# Patient Record
Sex: Male | Born: 1980
Health system: Southern US, Community
[De-identification: ages and names within clinical notes are randomized; demographics above are authoritative.]

## PROBLEM LIST (undated history)

## (undated) DIAGNOSIS — G4733 Obstructive sleep apnea (adult) (pediatric): Secondary | ICD-10-CM

## (undated) DIAGNOSIS — I5042 Chronic combined systolic (congestive) and diastolic (congestive) heart failure: Secondary | ICD-10-CM

## (undated) DIAGNOSIS — I4892 Unspecified atrial flutter: Secondary | ICD-10-CM

## (undated) DIAGNOSIS — Z9889 Other specified postprocedural states: Secondary | ICD-10-CM

## (undated) DIAGNOSIS — I493 Ventricular premature depolarization: Secondary | ICD-10-CM

## (undated) DIAGNOSIS — G4730 Sleep apnea, unspecified: Secondary | ICD-10-CM

## (undated) DIAGNOSIS — I428 Other cardiomyopathies: Secondary | ICD-10-CM

## (undated) HISTORY — DX: Obstructive sleep apnea (adult) (pediatric): G47.33

## (undated) HISTORY — PX: NO PAST SURGERIES: SHX2092

## (undated) HISTORY — DX: Other specified postprocedural states: Z98.890

---
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
## 2017-02-19 ENCOUNTER — Emergency Department (HOSPITAL_COMMUNITY): Payer: Self-pay

## 2017-02-19 ENCOUNTER — Emergency Department (HOSPITAL_COMMUNITY)
Admission: EM | Admit: 2017-02-19 | Discharge: 2017-02-20 | Disposition: A | Discharge status: Home / Self Care | Payer: Self-pay | Attending: Emergency Medicine | Admitting: Emergency Medicine

## 2017-02-19 ENCOUNTER — Encounter (HOSPITAL_COMMUNITY): Payer: Self-pay | Admitting: Emergency Medicine

## 2017-02-19 DIAGNOSIS — R600 Localized edema: Secondary | ICD-10-CM | POA: Insufficient documentation

## 2017-02-19 DIAGNOSIS — N289 Disorder of kidney and ureter, unspecified: Secondary | ICD-10-CM | POA: Insufficient documentation

## 2017-02-19 DIAGNOSIS — R609 Edema, unspecified: Secondary | ICD-10-CM

## 2017-02-19 LAB — CBC WITH DIFFERENTIAL/PLATELET
BASOS ABS: 0 10*3/uL (ref 0.0–0.1)
BASOS PCT: 0 %
EOS ABS: 0 10*3/uL (ref 0.0–0.7)
EOS PCT: 1 %
HEMATOCRIT: 43.2 % (ref 39.0–52.0)
Hemoglobin: 13.7 g/dL (ref 13.0–17.0)
Lymphocytes Relative: 24 %
Lymphs Abs: 1.6 10*3/uL (ref 0.7–4.0)
MCH: 26.5 pg (ref 26.0–34.0)
MCHC: 31.7 g/dL (ref 30.0–36.0)
MCV: 83.6 fL (ref 78.0–100.0)
MONO ABS: 0.9 10*3/uL (ref 0.1–1.0)
Monocytes Relative: 14 %
Neutro Abs: 4 10*3/uL (ref 1.7–7.7)
Neutrophils Relative %: 61 %
PLATELETS: 201 10*3/uL (ref 150–400)
RBC: 5.17 MIL/uL (ref 4.22–5.81)
RDW: 15.4 % (ref 11.5–15.5)
WBC: 6.6 10*3/uL (ref 4.0–10.5)

## 2017-02-19 LAB — URINALYSIS, ROUTINE W REFLEX MICROSCOPIC
BILIRUBIN URINE: NEGATIVE
Glucose, UA: NEGATIVE mg/dL
HGB URINE DIPSTICK: NEGATIVE
KETONES UR: NEGATIVE mg/dL
LEUKOCYTES UA: NEGATIVE
Nitrite: NEGATIVE
Specific Gravity, Urine: 1.022 (ref 1.005–1.030)
pH: 5 (ref 5.0–8.0)

## 2017-02-19 LAB — COMPREHENSIVE METABOLIC PANEL
ALBUMIN: 3.5 g/dL (ref 3.5–5.0)
ALT: 28 U/L (ref 17–63)
ANION GAP: 7 (ref 5–15)
AST: 30 U/L (ref 15–41)
Alkaline Phosphatase: 59 U/L (ref 38–126)
BILIRUBIN TOTAL: 4.1 mg/dL — AB (ref 0.3–1.2)
BUN: 14 mg/dL (ref 6–20)
CHLORIDE: 109 mmol/L (ref 101–111)
CO2: 25 mmol/L (ref 22–32)
Calcium: 9 mg/dL (ref 8.9–10.3)
Creatinine, Ser: 1.3 mg/dL — ABNORMAL HIGH (ref 0.61–1.24)
GFR calc Af Amer: >60 mL/min (ref >60)
GFR calc non Af Amer: >60 mL/min (ref >60)
GLUCOSE: 106 mg/dL — AB (ref 65–99)
POTASSIUM: 3.8 mmol/L (ref 3.5–5.1)
Sodium: 141 mmol/L (ref 135–145)
TOTAL PROTEIN: 6.8 g/dL (ref 6.5–8.1)

## 2017-02-19 LAB — BRAIN NATRIURETIC PEPTIDE: B NATRIURETIC PEPTIDE 5: 340.8 pg/mL — AB (ref 0.0–100.0)

## 2017-02-19 IMAGING — CR DG CHEST 2V
2 series · 2 of 2 positions shown · non-contrast
Comparison: None.

CLINICAL DATA: Shortness of breath.

EXAM:
CHEST  2 VIEW

[w chest pa]
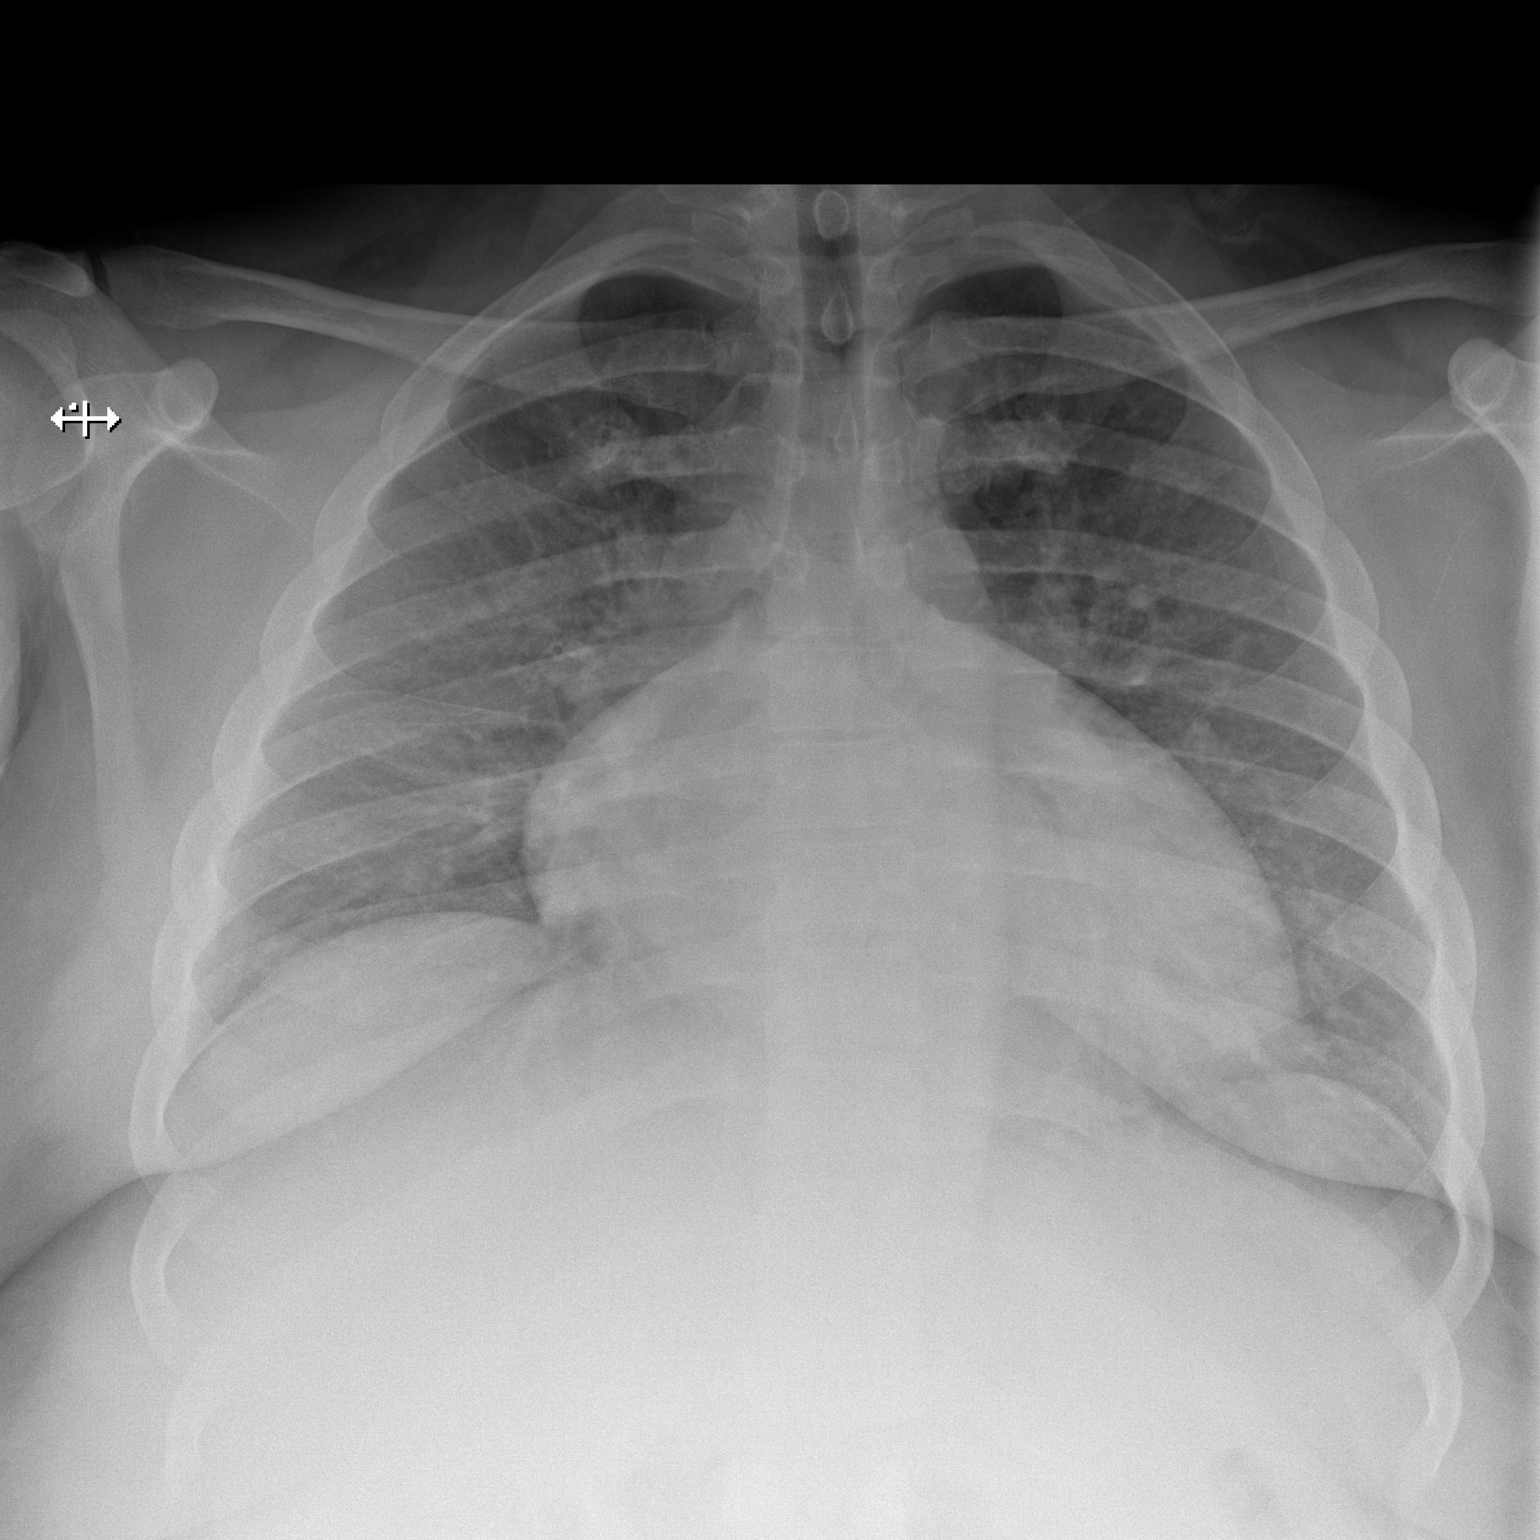

[w chest lat]
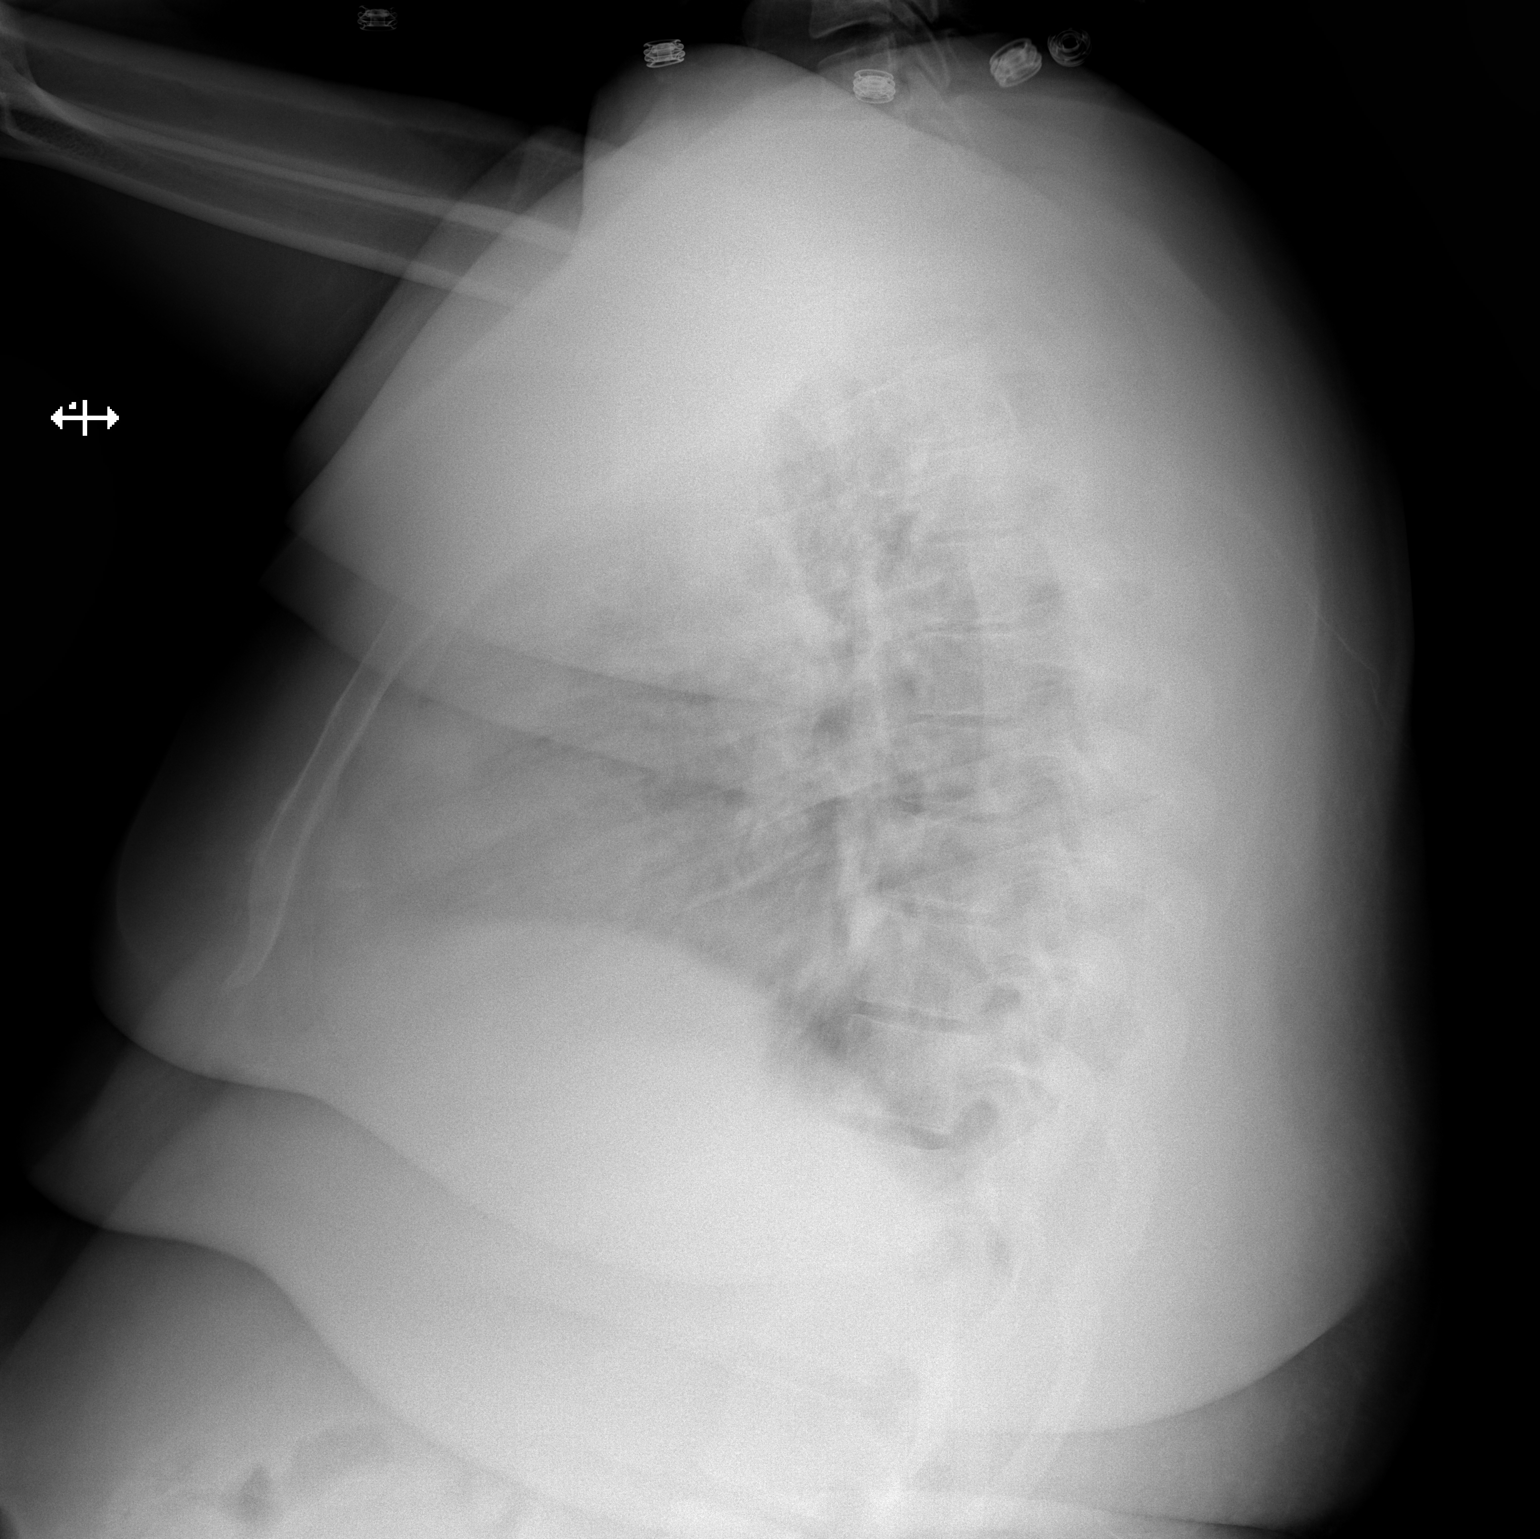

[2 of 2 positions shown; findings below may reference images not displayed]

FINDINGS: Mild cardiomegaly noted. Both lungs are clear. Respiratory motion
artifact noted on lateral view. No evidence of pneumothorax or
pleural effusion.
IMPRESSION: Mild cardiomegaly.  No active lung disease.

## 2017-02-19 MED ORDER — FUROSEMIDE 20 MG PO TABS: 20.0000 mg | ORAL_TABLET | Freq: Every day | ORAL | 0 refills | Status: DC

## 2017-02-19 NOTE — Discharge Instructions (Signed)
Call Dr. Isac Sarna office tomorrow to arrange to be seen this week in the office. You can start taking the medication prescribed tomorrow morning. Return to the emergency department if you develop fainting, lightheadedness or worsening breathing., Or if concern for any reason

## 2017-02-19 NOTE — ED Provider Notes (Signed)
[facility]-EMERGENCY DEPT Provider Note   CSN: [id number] Arrival date & time: 02/19/17  1652     History   Chief Complaint Chief Complaint  Patient presents with  . Groin Swelling    HPI Bradley Powell is a 36 y.o. male.Patient has noticed scrotum and penis swelling gradually onset 4 days ago. His father is noticed swelling of bilateral legs for the past several weeks. Other associated symptoms Patient reports dyspnea for 1 year, unchanged with nonproductive cough.. Patient has difficulty walking due to "my scrotum getting in the way" but denies leg pain. Denies chest pain. He does not have increased  with  dyspnealying supineno other associated symptoms he spoke with Dr. Kevan Ny via telephone who suggested that he come to the emergency department to check for testicular torsion. Patient denies scrotal pain   HPI Nothing makes symptoms better or worse. No other associated symptoms. No treatment prior to coming here  History reviewed. No pertinent past medical history.  There are no active problems to display for this patient.  past medical history negative   History reviewed. No pertinent surgical history.     Home Medications    Prior to Admission medications   Not on File    Family History History reviewed. No pertinent family history.  Social History Social History  Substance Use Topics  . Smoking status: Never Smoker  . Smokeless tobacco: Never Used  . Alcohol use Yes     Comment: rarely  Denies drug use    Allergies   Patient has no known allergies.   Review of Systems Review of Systems  Respiratory: Positive for shortness of breath.   Cardiovascular: Positive for leg swelling.  Genitourinary: Positive for penile swelling and scrotal swelling.  All other systems reviewed and are negative.    Physical Exam Updated Vital Signs BP (!) 143/106 (BP Location: Right Arm)   Pulse (!) 115   Temp 98 F (36.7 C) (Oral)   Resp 18   SpO2 98%   Physical Exam    Constitutional: No distress.  Chronically ill-appearing  HENT:  Head: Normocephalic and atraumatic.  Eyes: Conjunctivae are normal. Pupils are equal, round, and reactive to light.  Neck: Neck supple. No tracheal deviation present. No thyromegaly present.  Cardiovascular: Normal rate and regular rhythm.   No murmur heard. Pulmonary/Chest: Effort normal and breath sounds normal. No respiratory distress.  Abdominal: Soft. Bowel sounds are normal. He exhibits no distension. There is no tenderness.  Morbidly obese trace pitting edema at lower abdomen.  Genitourinary:  Genitourinary Comments: Penis and scrotum pitting edema. No redness or tenderness  Musculoskeletal: Normal range of motion. He exhibits edema. He exhibits no tenderness.  2+ pretibial pitting edema bilaterally  Neurological: He is alert. Coordination normal.  Skin: Skin is warm and dry. No rash noted.  Psychiatric: He has a normal mood and affect.  Nursing note and vitals reviewed.    ED Treatments / Results  Labs (all labs ordered are listed, but only abnormal results are displayed) Labs Reviewed  CBC WITH DIFFERENTIAL/PLATELET  COMPREHENSIVE METABOLIC PANEL  URINALYSIS, ROUTINE W REFLEX MICROSCOPIC  BRAIN NATRIURETIC PEPTIDE    EKG  EKG Interpretation None       Radiology No results found.  Procedures Procedures (including critical care time)  Medications Ordered in ED Medications - No data to display  8:15 PM patient is alert ambulate without difficulty. Results for orders placed or performed during the hospital encounter of 02/19/17  CBC with Differential/Platelet  Result  Value Ref Range   WBC 6.6 4.0 - 10.5 K/uL   RBC 5.17 4.22 - 5.81 MIL/uL   Hemoglobin 13.7 13.0 - 17.0 g/dL   HCT 71.6 96.7 - 89.3 %   MCV 83.6 78.0 - 100.0 fL   MCH 26.5 26.0 - 34.0 pg   MCHC 31.7 30.0 - 36.0 g/dL   RDW 81.0 17.5 - 10.2 %   Platelets 201 150 - 400 K/uL   Neutrophils Relative % 61 %   Neutro Abs 4.0 1.7 -  7.7 K/uL   Lymphocytes Relative 24 %   Lymphs Abs 1.6 0.7 - 4.0 K/uL   Monocytes Relative 14 %   Monocytes Absolute 0.9 0.1 - 1.0 K/uL   Eosinophils Relative 1 %   Eosinophils Absolute 0.0 0.0 - 0.7 K/uL   Basophils Relative 0 %   Basophils Absolute 0.0 0.0 - 0.1 K/uL  Comprehensive metabolic panel  Result Value Ref Range   Sodium 141 135 - 145 mmol/L   Potassium 3.8 3.5 - 5.1 mmol/L   Chloride 109 101 - 111 mmol/L   CO2 25 22 - 32 mmol/L   Glucose, Bld 106 (H) 65 - 99 mg/dL   BUN 14 6 - 20 mg/dL   Creatinine, Ser 5.85 (H) 0.61 - 1.24 mg/dL   Calcium 9.0 8.9 - 27.7 mg/dL   Total Protein 6.8 6.5 - 8.1 g/dL   Albumin 3.5 3.5 - 5.0 g/dL   AST 30 15 - 41 U/L   ALT 28 17 - 63 U/L   Alkaline Phosphatase 59 38 - 126 U/L   Total Bilirubin 4.1 (H) 0.3 - 1.2 mg/dL   GFR calc non Af Amer >60 >60 mL/min   GFR calc Af Amer >60 >60 mL/min   Anion gap 7 5 - 15  Urinalysis, Routine w reflex microscopic  Result Value Ref Range   Color, Urine AMBER (A) YELLOW   APPearance HAZY (A) CLEAR   Specific Gravity, Urine 1.022 1.005 - 1.030   pH 5.0 5.0 - 8.0   Glucose, UA NEGATIVE NEGATIVE mg/dL   Hgb urine dipstick NEGATIVE NEGATIVE   Bilirubin Urine NEGATIVE NEGATIVE   Ketones, ur NEGATIVE NEGATIVE mg/dL   Protein, ur >=824 (A) NEGATIVE mg/dL   Nitrite NEGATIVE NEGATIVE   Leukocytes, UA NEGATIVE NEGATIVE   RBC / HPF 0-5 0 - 5 RBC/hpf   WBC, UA 0-5 0 - 5 WBC/hpf   Bacteria, UA RARE (A) NONE SEEN   Squamous Epithelial / LPF 0-5 (A) NONE SEEN   Mucous PRESENT    Granular Casts, UA PRESENT   Brain natriuretic peptide  Result Value Ref Range   B Natriuretic Peptide 340.8 (H) 0.0 - 100.0 pg/mL   Dg Chest 2 View  Result Date: 02/19/2017 CLINICAL DATA:  Shortness of breath. EXAM: CHEST  2 VIEW COMPARISON:  None. FINDINGS: Mild cardiomegaly noted. Both lungs are clear. Respiratory motion artifact noted on lateral view. No evidence of pneumothorax or pleural effusion. IMPRESSION: Mild cardiomegaly.   No active lung disease. Electronically Signed   By: Myles Rosenthal M.D.   On: 02/19/2017 18:54   Initial Impression / Assessment and Plan / ED Course  I have reviewed the triage vital signs and the nursing notes.  Pertinent labs & imaging results that were available during my care of the patient were reviewed by me and considered in my medical decision making (see chart for details).      Patient exhibiting peripheral edema. He has no signs of pulmonary  edema. He lies flat without difficulty. Normal chest x-ray. He does exhibit mild renal insufficiency. Will start Lasix 20 mg daily. Suggest follow-up with Dr. Kevan Ny this week  Final Clinical Impressions(s) / ED Diagnoses  Diagnosis #1 peripheral edema #2 mild renal insufficiency Final diagnoses:  None    New Prescriptions New Prescriptions   No medications on file     Doug Sou, MD 02/19/17 2021

## 2017-02-19 NOTE — ED Triage Notes (Signed)
Pt c/o swollen scrotum. Dr. Kevan Ny sent pt to ED, who was concerned about testicular torsion. Pain onset Thursday about 0400. Difficult to urinate but able to. Some numbness to right leg. No worsening or change in swelling today. No Pain.

## 2017-02-20 NOTE — ED Provider Notes (Signed)
Patient presented to the ED today stating he did not get a prescription for Lasix yesterday.  I wrote a handwritten prescription for Lasix 20 mg #30 no refill   Mancel Bale, MD 02/20/17 1316

## 2017-02-22 ENCOUNTER — Ambulatory Visit (HOSPITAL_COMMUNITY): Payer: Self-pay | Attending: Cardiology

## 2017-02-22 ENCOUNTER — Other Ambulatory Visit: Payer: Self-pay | Admitting: Internal Medicine

## 2017-02-22 ENCOUNTER — Other Ambulatory Visit: Payer: Self-pay

## 2017-02-22 ENCOUNTER — Encounter (HOSPITAL_COMMUNITY): Payer: Self-pay | Admitting: *Deleted

## 2017-02-22 DIAGNOSIS — I509 Heart failure, unspecified: Secondary | ICD-10-CM

## 2017-02-22 DIAGNOSIS — I081 Rheumatic disorders of both mitral and tricuspid valves: Secondary | ICD-10-CM | POA: Insufficient documentation

## 2017-02-22 DIAGNOSIS — Z8249 Family history of ischemic heart disease and other diseases of the circulatory system: Secondary | ICD-10-CM | POA: Insufficient documentation

## 2017-02-22 DIAGNOSIS — E669 Obesity, unspecified: Secondary | ICD-10-CM | POA: Insufficient documentation

## 2017-02-22 NOTE — Progress Notes (Unsigned)
2D Echo was completed, results (decreased EF, edema, arrythmia- HR between 100-200) taken to DOD, Dr. Johney Frame.  He feels the patient should be seen by General Cardiology, and the patient was scheduled to be seen by Dr. Anne Fu on Friday at 1:20 PM.  I explained the importance of keeping this appointment, and Bradley Powell was very agreeable.  He is stable and does not complain of any symptoms during visit.    Farrel Conners, RDCS

## 2017-02-24 ENCOUNTER — Inpatient Hospital Stay (HOSPITAL_COMMUNITY)
Admission: AD | Admit: 2017-02-24 | Discharge: 2017-03-07 | DRG: 287 | Disposition: A | Discharge status: Home / Self Care | Payer: Self-pay | Source: Ambulatory Visit | Attending: Cardiology | Admitting: Cardiology

## 2017-02-24 ENCOUNTER — Ambulatory Visit (HOSPITAL_COMMUNITY): Payer: Self-pay | Admitting: Cardiology

## 2017-02-24 ENCOUNTER — Encounter (HOSPITAL_COMMUNITY): Payer: Self-pay | Admitting: Cardiology

## 2017-02-24 VITALS — BP 138/78 | HR 115 | Ht 68.0 in | Wt 333.5 lb

## 2017-02-24 DIAGNOSIS — M940 Chondrocostal junction syndrome [Tietze]: Secondary | ICD-10-CM | POA: Insufficient documentation

## 2017-02-24 DIAGNOSIS — E663 Overweight: Secondary | ICD-10-CM | POA: Insufficient documentation

## 2017-02-24 DIAGNOSIS — R0602 Shortness of breath: Secondary | ICD-10-CM

## 2017-02-24 DIAGNOSIS — I509 Heart failure, unspecified: Secondary | ICD-10-CM | POA: Insufficient documentation

## 2017-02-24 DIAGNOSIS — R609 Edema, unspecified: Secondary | ICD-10-CM | POA: Diagnosis present

## 2017-02-24 DIAGNOSIS — I440 Atrioventricular block, first degree: Secondary | ICD-10-CM | POA: Diagnosis present

## 2017-02-24 DIAGNOSIS — R000 Tachycardia, unspecified: Secondary | ICD-10-CM

## 2017-02-24 DIAGNOSIS — E876 Hypokalemia: Secondary | ICD-10-CM | POA: Diagnosis not present

## 2017-02-24 DIAGNOSIS — N5089 Other specified disorders of the male genital organs: Secondary | ICD-10-CM | POA: Diagnosis present

## 2017-02-24 DIAGNOSIS — I5041 Acute combined systolic (congestive) and diastolic (congestive) heart failure: Secondary | ICD-10-CM | POA: Diagnosis present

## 2017-02-24 DIAGNOSIS — R29818 Other symptoms and signs involving the nervous system: Secondary | ICD-10-CM

## 2017-02-24 DIAGNOSIS — M109 Gout, unspecified: Secondary | ICD-10-CM | POA: Diagnosis not present

## 2017-02-24 DIAGNOSIS — I5043 Acute on chronic combined systolic (congestive) and diastolic (congestive) heart failure: Principal | ICD-10-CM | POA: Diagnosis present

## 2017-02-24 DIAGNOSIS — I5082 Biventricular heart failure: Secondary | ICD-10-CM | POA: Diagnosis present

## 2017-02-24 DIAGNOSIS — I2729 Other secondary pulmonary hypertension: Secondary | ICD-10-CM | POA: Diagnosis present

## 2017-02-24 DIAGNOSIS — E66812 Obesity, class 2: Secondary | ICD-10-CM | POA: Diagnosis present

## 2017-02-24 DIAGNOSIS — Z6841 Body Mass Index (BMI) 40.0 and over, adult: Secondary | ICD-10-CM

## 2017-02-24 LAB — CBC WITH DIFFERENTIAL/PLATELET
BASOS PCT: 0 %
Basophils Absolute: 0 10*3/uL (ref 0.0–0.1)
EOS ABS: 0.1 10*3/uL (ref 0.0–0.7)
Eosinophils Relative: 1 %
HCT: 42.2 % (ref 39.0–52.0)
HEMOGLOBIN: 13.5 g/dL (ref 13.0–17.0)
LYMPHS ABS: 1.6 10*3/uL (ref 0.7–4.0)
Lymphocytes Relative: 20 %
MCH: 26.5 pg (ref 26.0–34.0)
MCHC: 32 g/dL (ref 30.0–36.0)
MCV: 82.9 fL (ref 78.0–100.0)
Monocytes Absolute: 0.9 10*3/uL (ref 0.1–1.0)
Monocytes Relative: 11 %
NEUTROS PCT: 68 %
Neutro Abs: 5.2 10*3/uL (ref 1.7–7.7)
PLATELETS: 215 10*3/uL (ref 150–400)
RBC: 5.09 MIL/uL (ref 4.22–5.81)
RDW: 15.4 % (ref 11.5–15.5)
WBC: 7.7 10*3/uL (ref 4.0–10.5)

## 2017-02-24 LAB — COMPREHENSIVE METABOLIC PANEL
ALT: 30 U/L (ref 17–63)
ANION GAP: 9 (ref 5–15)
AST: 32 U/L (ref 15–41)
Albumin: 3.3 g/dL — ABNORMAL LOW (ref 3.5–5.0)
Alkaline Phosphatase: 66 U/L (ref 38–126)
BUN: 20 mg/dL (ref 6–20)
CHLORIDE: 106 mmol/L (ref 101–111)
CO2: 25 mmol/L (ref 22–32)
CREATININE: 1.27 mg/dL — AB (ref 0.61–1.24)
Calcium: 9.1 mg/dL (ref 8.9–10.3)
Glucose, Bld: 100 mg/dL — ABNORMAL HIGH (ref 65–99)
POTASSIUM: 3.9 mmol/L (ref 3.5–5.1)
SODIUM: 140 mmol/L (ref 135–145)
Total Bilirubin: 4.3 mg/dL — ABNORMAL HIGH (ref 0.3–1.2)
Total Protein: 6.6 g/dL (ref 6.5–8.1)

## 2017-02-24 LAB — MAGNESIUM: MAGNESIUM: 2 mg/dL (ref 1.7–2.4)

## 2017-02-24 LAB — PROTIME-INR
INR: 1.32
PROTHROMBIN TIME: 16.5 s — AB (ref 11.4–15.2)

## 2017-02-24 LAB — TROPONIN I: TROPONIN I: 0.04 ng/mL — AB (ref <0.03)

## 2017-02-24 LAB — BRAIN NATRIURETIC PEPTIDE: B Natriuretic Peptide: 361.6 pg/mL — ABNORMAL HIGH (ref 0.0–100.0)

## 2017-02-24 MED ORDER — SODIUM CHLORIDE 0.9% FLUSH
3.0000 mL | Freq: Two times a day (BID) | INTRAVENOUS | Status: DC
  Administered 2017-02-24 – 2017-03-06 (×17): 3 mL via INTRAVENOUS

## 2017-02-24 MED ORDER — ALPRAZOLAM 0.25 MG PO TABS
0.2500 mg | ORAL_TABLET | Freq: Two times a day (BID) | ORAL | Status: DC | PRN
  Administered 2017-03-01 – 2017-03-04 (×4): 0.25 mg via ORAL
  Filled 2017-02-24 (×5): qty 1

## 2017-02-24 MED ORDER — HEPARIN SODIUM (PORCINE) 5000 UNIT/ML IJ SOLN
5000.0000 [IU] | Freq: Three times a day (TID) | INTRAMUSCULAR | Status: DC
  Administered 2017-02-24 – 2017-03-06 (×29): 5000 [IU] via SUBCUTANEOUS
  Filled 2017-02-24 (×33): qty 1

## 2017-02-24 MED ORDER — ZOLPIDEM TARTRATE 5 MG PO TABS
5.0000 mg | ORAL_TABLET | Freq: Every evening | ORAL | Status: DC | PRN
  Filled 2017-02-24: qty 1

## 2017-02-24 MED ORDER — SODIUM CHLORIDE 0.9% FLUSH: 3.0000 mL | INTRAVENOUS | Status: DC | PRN

## 2017-02-24 MED ORDER — ACETAMINOPHEN 325 MG PO TABS: 650.0000 mg | ORAL_TABLET | ORAL | Status: DC | PRN

## 2017-02-24 MED ORDER — ASPIRIN EC 81 MG PO TBEC
81.0000 mg | DELAYED_RELEASE_TABLET | Freq: Every day | ORAL | Status: DC
  Administered 2017-02-24 – 2017-03-05 (×9): 81 mg via ORAL
  Filled 2017-02-24 (×10): qty 1

## 2017-02-24 MED ORDER — ACETAMINOPHEN 500 MG PO TABS: 500.0000 mg | ORAL_TABLET | Freq: Four times a day (QID) | ORAL | Status: DC | PRN

## 2017-02-24 MED ORDER — FUROSEMIDE 10 MG/ML IJ SOLN
80.0000 mg | Freq: Two times a day (BID) | INTRAMUSCULAR | Status: DC
  Administered 2017-02-24 – 2017-02-25 (×2): 80 mg via INTRAVENOUS
  Filled 2017-02-24 (×2): qty 8

## 2017-02-24 MED ORDER — ONDANSETRON HCL 4 MG/2ML IJ SOLN: 4.0000 mg | Freq: Four times a day (QID) | INTRAMUSCULAR | Status: DC | PRN

## 2017-02-24 MED ORDER — SODIUM CHLORIDE 0.9 % IV SOLN: 250.0000 mL | INTRAVENOUS | Status: DC | PRN

## 2017-02-24 MED ORDER — POTASSIUM CHLORIDE CRYS ER 20 MEQ PO TBCR
20.0000 meq | EXTENDED_RELEASE_TABLET | Freq: Two times a day (BID) | ORAL | Status: DC
  Administered 2017-02-24 – 2017-03-04 (×15): 20 meq via ORAL
  Filled 2017-02-24 (×17): qty 1

## 2017-02-24 MED ORDER — LISINOPRIL 5 MG PO TABS
5.0000 mg | ORAL_TABLET | Freq: Every day | ORAL | Status: DC
  Administered 2017-02-24 – 2017-02-28 (×5): 5 mg via ORAL
  Filled 2017-02-24 (×5): qty 1

## 2017-02-24 NOTE — Progress Notes (Signed)
Pt arrived, resting in bed with father in room.  No complaints.  Labs to follow.

## 2017-02-24 NOTE — H&P (Signed)
History and Physical   Per Dr. Anne Fu  Cardiology history and physical    Date:  02/24/2017   ID:  Bradley Powell, DOB Apr 20, 1981, MRN [id number]  PCP:  Bradley Apo, MD          Cardiologist:   Bradley Schultz, MD     History of Present Illness:  Bradley Powell is a 36 y.o. male here for evaluation of newly diagnosed systolic heart failure with severe edema at the request of Dr. [name]. Baseline ECG on 02/21/17 shows low voltage, sinus tachycardia heart rate of 115 bpm with first-degree AV block.  In review of Dr. Idelle Powell note, he was having increased edema, scrotal edema, without prior history of heart failure. His thoughts were that the swelling was probably related to congestive heart failure but cannot exclude pulmonary hypertension as his father had concern for severe sleep apnea. He was given diuresis and a 2-D echo was obtained.  His echo shows an EF of 40-45% with mild to moderate pulmonary hypertension 46 mmHg.  When laying down he tablet has increased coughing. This is been fairly chronic. He states that one year ago for a physical at his work, his weight was 260 pounds. Today is 330 pounds.  His main issue that brought him in to see Dr. Nehemiah Powell was the new scrotal swelling.  He is not describing any chest pain. Positive orthopnea. No syncope, no fevers, no bleeding, no prior heart history, no smoking, no diabetes   Mucus at night. Burning numbness in leg. Mother Bradley Powell, I take care of.  Cough since Nov.   Urology was also consulted by Dr. Nehemiah Powell as outpatient. Lasix 40 mg twice a day was administered on 02/21/17, not a tremendous amount of urine. His weight was 335 pounds at that visit. BMI was 51. He was satting on 98% on room air.  I take care of his mother Bradley Powell.      Past Medical History:  Diagnosis Date  . History of esophagogastroduodenoscopy (EGD)          Past Surgical History:  Procedure Laterality Date  . NO PAST  SURGERIES      Current Medications:       Outpatient Medications Prior to Visit  Medication Sig Dispense Refill  . acetaminophen (TYLENOL) 500 MG tablet Take 500 mg by mouth every 6 (six) hours as needed (pain).    . furosemide (LASIX) 20 MG tablet Take 1 tablet (20 mg total) by mouth daily. (Patient taking differently: Take 80 mg by mouth daily. ) 30 tablet 0   No facility-administered medications prior to visit.      Allergies:   Patient has no known allergies.   Social History        Social History  . Marital status: Single    Spouse name: N/A  . Number of children: N/A  . Years of education: N/A         Social History Main Topics  . Smoking status: Never Smoker  . Smokeless tobacco: Never Used  . Alcohol use Yes     Comment: rarely  . Drug use: No  . Sexual activity: Not on file       Other Topics Concern  . Not on file      Social History Narrative  . No narrative on file     Family History:  The patient's family history includes CVA in his mother; Gout in his father; Hypertension in his father; Multiple sclerosis in  his mother; Seizures in his mother.   ROS:   Please see the history of present illness.    ROS All other systems reviewed and are negative.   PHYSICAL EXAM:   VS:  BP 138/78   Pulse (!) 115   Ht 5\' 8"  (1.727 m)   Wt (!) 333 lb 8 oz (151.3 kg)   SpO2 97%   BMI 50.71 kg/m    GEN: Morbidly obese, in no acute distress  HEENT: normal thick neck Neck: + JVD to jaw line, no carotid bruits, or masses Cardiac: Tachycardic regular, positive S3; no murmurs. 3-4+ pitting edema bilateral lower extremities up to thighs. Scrotal edema. Respiratory:  clear to auscultation bilaterally, normal work of breathing GI: soft, nontender, nondistended, + BS, obese MS: no deformity or atrophy  Skin: warm and dry, no rash Neuro:  Alert and Oriented x 3, Strength and sensation are intact Psych: euthymic mood, full affect     Wt  Readings from Last 3 Encounters:  02/24/17 (!) 333 lb 8 oz (151.3 kg)      Studies/Labs Reviewed:   EKG:  Prior EKG as described above, personally viewed  Recent Labs: 02/19/2017: ALT 28; B Natriuretic Peptide 340.8; BUN 14; Creatinine, Ser 1.30; Hemoglobin 13.7; Platelets 201; Potassium 3.8; Sodium 141   Lipid Panel Labs (Brief)  No results found for: CHOL, TRIG, HDL, CHOLHDL, VLDL, LDLCALC, LDLDIRECT    Additional studies/ records that were reviewed today include:   ECHO: 02/22/17 - Left ventricle: The cavity size was normal. Systolic function was mildly to moderately reduced. The estimated ejection fraction was in the range of 40% to 45%. Wall motion was normal; there were no regional wall motion abnormalities. Features are consistent with a pseudonormal left ventricular filling pattern, with concomitant abnormal relaxation and increased filling pressure (grade 2 diastolic dysfunction). - Mitral valve: There was mild to moderate regurgitation. - Left atrium: The atrium was mildly dilated. Anterior-posterior dimension: 46 mm. - Pulmonary arteries: Systolic pressure was mildly increased. - Pericardium, extracardiac: A trivial pericardial effusion was identified posterior to the heart.  TSH was normal 3.61, BNP was 307 elevated, creatinine 1.13.   ASSESSMENT:    1. Acute combined systolic and diastolic heart failure (HCC)   2. Sinus tachycardia   3. First degree AV block   4. Scrotal edema   5. Morbid obesity (HCC)      PLAN:  In order of problems listed above:  Acute systolic and diastolic heart failure  - He is grossly fluid overloaded currently, ascites. Scrotal edema.  - Last year weight was 260 pounds, currently 333 pounds.  - Ejection fraction 40-45%.  - Inpatient admission  - We will place on IV Lasix 80 mg twice a day  - Potassium supplementation, KDur 20 meq BID  - Closely monitor basic metabolic profile, for renal  performance  - He has several liters of fluid on board that needs to be diuresed.  - TSH was normal.  - Check complete metabolic profile.   - Add lisinopril 5 mg once a day.  - Add carvedilol once diuresed  Sinus tachycardia with first-degree AV block  - Perhaps this is an atrial tachycardia. P waves are upright in 1, 2, 3.  - Hopefully this will improve as cardiac condition improves.  Scrotal edema  - Hopefully with aggressive diuresis, this will decrease  - If necessary, can consult urology.  Morbid obesity  - Continue to encourage weight loss.  DVT prophylaxis   Medication Adjustments/Labs and  Tests Ordered: Current medicines are reviewed at length with the patient today.  Concerns regarding medicines are outlined above.  Medication changes, Labs and Tests ordered today are listed in the Patient Instructions below. There are no Patient Instructions on file for this visit.   Signed, Bradley Schultz, MD  02/24/2017 1:58 PM    [facility] [address] [place], [place], [place]  [address] Phone: [phone]; Fax: [phone]

## 2017-02-24 NOTE — Progress Notes (Signed)
Cardiology history and physical    Date:  02/24/2017   ID:  Bradley Powell, DOB 1981/08/12, MRN [id number]  PCP:  Katy Apo, MD  Cardiologist:   Donato Schultz, MD     History of Present Illness:  Bradley Powell is a 36 y.o. male here for evaluation of newly diagnosed systolic heart failure with severe edema at the request of Dr. [name]. Baseline ECG on 02/21/17 shows low voltage, sinus tachycardia heart rate of 115 bpm with first-degree AV block.  In review of Dr. Idelle Crouch note, he was having increased edema, scrotal edema, without prior history of heart failure. His thoughts were that the swelling was probably related to congestive heart failure but cannot exclude pulmonary hypertension as his father had concern for severe sleep apnea. He was given diuresis and a 2-D echo was obtained.  His echo shows an EF of 40-45% with mild to moderate pulmonary hypertension 46 mmHg.  When laying down he tablet has increased coughing. This is been fairly chronic. He states that one year ago for a physical at his work, his weight was 260 pounds. Today is 330 pounds.  His main issue that brought him in to see Dr. Nehemiah Settle was the new scrotal swelling.  He is not describing any chest pain. Positive orthopnea. No syncope, no fevers, no bleeding, no prior heart history, no smoking, no diabetes   Mucus at night. Burning numbness in leg. Mother Porfirio Mylar, I take care of.  Cough since Nov.   Urology was also consulted by Dr. Nehemiah Settle as outpatient. Lasix 40 mg twice a day was administered on 02/21/17, not a tremendous amount of urine. His weight was 335 pounds at that visit. BMI was 51. He was satting on 98% on room air.  I take care of his mother Porfirio Mylar.  Past Medical History:  Diagnosis Date  . History of esophagogastroduodenoscopy (EGD)     Past Surgical History:  Procedure Laterality Date  . NO PAST SURGERIES      Current Medications: Outpatient Medications Prior to Visit    Medication Sig Dispense Refill  . acetaminophen (TYLENOL) 500 MG tablet Take 500 mg by mouth every 6 (six) hours as needed (pain).    . furosemide (LASIX) 20 MG tablet Take 1 tablet (20 mg total) by mouth daily. (Patient taking differently: Take 80 mg by mouth daily. ) 30 tablet 0   No facility-administered medications prior to visit.      Allergies:   Patient has no known allergies.   Social History   Social History  . Marital status: Single    Spouse name: N/A  . Number of children: N/A  . Years of education: N/A   Social History Main Topics  . Smoking status: Never Smoker  . Smokeless tobacco: Never Used  . Alcohol use Yes     Comment: rarely  . Drug use: No  . Sexual activity: Not on file   Other Topics Concern  . Not on file   Social History Narrative  . No narrative on file     Family History:  The patient's family history includes CVA in his mother; Gout in his father; Hypertension in his father; Multiple sclerosis in his mother; Seizures in his mother.   ROS:   Please see the history of present illness.    ROS All other systems reviewed and are negative.   PHYSICAL EXAM:   VS:  BP 138/78   Pulse (!) 115   Ht 5\' 8"  (1.727  m)   Wt (!) 333 lb 8 oz (151.3 kg)   SpO2 97%   BMI 50.71 kg/m    GEN: Morbidly obese, in no acute distress  HEENT: normal thick neck Neck: + JVD to jaw line, no carotid bruits, or masses Cardiac: Tachycardic regular, positive S3; no murmurs. 3-4+ pitting edema bilateral lower extremities up to thighs. Scrotal edema. Respiratory:  clear to auscultation bilaterally, normal work of breathing GI: soft, nontender, nondistended, + BS, obese MS: no deformity or atrophy  Skin: warm and dry, no rash Neuro:  Alert and Oriented x 3, Strength and sensation are intact Psych: euthymic mood, full affect  Wt Readings from Last 3 Encounters:  02/24/17 (!) 333 lb 8 oz (151.3 kg)      Studies/Labs Reviewed:   EKG:  Prior EKG as described  above, personally viewed  Recent Labs: 02/19/2017: ALT 28; B Natriuretic Peptide 340.8; BUN 14; Creatinine, Ser 1.30; Hemoglobin 13.7; Platelets 201; Potassium 3.8; Sodium 141   Lipid Panel No results found for: CHOL, TRIG, HDL, CHOLHDL, VLDL, LDLCALC, LDLDIRECT  Additional studies/ records that were reviewed today include:   ECHO: 02/22/17 - Left ventricle: The cavity size was normal. Systolic function was   mildly to moderately reduced. The estimated ejection fraction was   in the range of 40% to 45%. Wall motion was normal; there were no   regional wall motion abnormalities. Features are consistent with   a pseudonormal left ventricular filling pattern, with concomitant   abnormal relaxation and increased filling pressure (grade 2   diastolic dysfunction). - Mitral valve: There was mild to moderate regurgitation. - Left atrium: The atrium was mildly dilated. Anterior-posterior   dimension: 46 mm. - Pulmonary arteries: Systolic pressure was mildly increased. - Pericardium, extracardiac: A trivial pericardial effusion was   identified posterior to the heart.  TSH was normal 3.61, BNP was 307 elevated, creatinine 1.13.   ASSESSMENT:    1. Acute combined systolic and diastolic heart failure (HCC)   2. Sinus tachycardia   3. First degree AV block   4. Scrotal edema   5. Morbid obesity (HCC)      PLAN:  In order of problems listed above:  Acute systolic and diastolic heart failure  - He is grossly fluid overloaded currently, ascites. Scrotal edema.  - Last year weight was 260 pounds, currently 333 pounds.  - Ejection fraction 40-45%.  - Inpatient admission  - We will place on IV Lasix 80 mg twice a day  - Potassium supplementation, KDur 20 meq BID  - Closely monitor basic metabolic profile, for renal performance  - He has several liters of fluid on board that needs to be diuresed.  - TSH was normal.  - Check complete metabolic profile.   - Add lisinopril 5 mg once a  day.  - Add carvedilol once diuresed  Sinus tachycardia with first-degree AV block  - Perhaps this is an atrial tachycardia. P waves are upright in 1, 2, 3.  - Hopefully this will improve as cardiac condition improves.  Scrotal edema  - Hopefully with aggressive diuresis, this will decrease  - If necessary, can consult urology.  Morbid obesity  - Continue to encourage weight loss.  DVT prophylaxis   Medication Adjustments/Labs and Tests Ordered: Current medicines are reviewed at length with the patient today.  Concerns regarding medicines are outlined above.  Medication changes, Labs and Tests ordered today are listed in the Patient Instructions below. There are no Patient Instructions on file for  this visit.   Signed, Donato Schultz, MD  02/24/2017 1:58 PM    [facility] [address] [place], [place], [place]  [address] Phone: [phone]; Fax: [phone]

## 2017-02-24 NOTE — Progress Notes (Signed)
Nutrition Education Note  RD consulted for nutrition education regarding new onset CHF.  RD provided "Heart failureNutrition Therapy" handout from the Academy of [employer]. Reviewed patient's dietary recall. Pt reports eating fruit in the morning and chicken or fish with vegetables for dinner. He states that he doesn't use salt because his mother also has heart failure. He reports drinking a lot of water. He occasionally eats out, but not often. Provided examples on ways to monitor sodium intake in diet. Discussed tips for eating out. Discouraged intake of processed foods and use of salt shaker. Encouraged fresh fruits and vegetables. Discussed the importance of eating consistently rather than only one meal daily. Provide "Power Up with Breakfast" handout from the Academy of [employer].  RD discussed why it is important for patient to adhere to diet recommendations, and emphasized the role of fluids, foods to avoid, and importance of weighing self daily. Teach back method used.  Expect good compliance.  Body mass index is 48.92 kg/m. Pt meets criteria for Morbid Obesity based on current BMI. Pt states that he usually weighs between 250 lbs and 260 lbs. Current weight is 326 lbs.   Current diet order is 2 gram Sodium, patient is consuming approximately 100% of meals at this time. Labs and medications reviewed. No further nutrition interventions warranted at this time. RD contact information provided. If additional nutrition issues arise, please re-consult RD.   Dorothea Ogle RD, LDN, CSP Inpatient Clinical Dietitian Pager: [phone] After Hours Pager: [phone]

## 2017-02-24 NOTE — Progress Notes (Signed)
CRITICAL VALUE ALERT  Critical value received:  Troponin 0.04  Date of notification:  02/24/17  Time of notification: 1800  Critical value read back:  yes  Nurse who received alert:  Zollie Beckers  MD notified (1st page):  Annie Paras, NP  Time of first page:  1815  MD notified (2nd page):  Time of second page:  Responding MD:  Annie Paras, NP  Time MD responded:

## 2017-02-25 ENCOUNTER — Inpatient Hospital Stay (HOSPITAL_COMMUNITY): Payer: Self-pay

## 2017-02-25 DIAGNOSIS — I5041 Acute combined systolic (congestive) and diastolic (congestive) heart failure: Secondary | ICD-10-CM

## 2017-02-25 LAB — BASIC METABOLIC PANEL
ANION GAP: 12 (ref 5–15)
BUN: 20 mg/dL (ref 6–20)
CALCIUM: 9.3 mg/dL (ref 8.9–10.3)
CHLORIDE: 102 mmol/L (ref 101–111)
CO2: 25 mmol/L (ref 22–32)
Creatinine, Ser: 1.26 mg/dL — ABNORMAL HIGH (ref 0.61–1.24)
GFR calc Af Amer: >60 mL/min (ref >60)
GFR calc non Af Amer: >60 mL/min (ref >60)
GLUCOSE: 115 mg/dL — AB (ref 65–99)
Potassium: 4.5 mmol/L (ref 3.5–5.1)
Sodium: 139 mmol/L (ref 135–145)

## 2017-02-25 LAB — HIV ANTIBODY (ROUTINE TESTING W REFLEX): HIV Screen 4th Generation wRfx: NONREACTIVE

## 2017-02-25 IMAGING — CR DG CHEST 1V PORT
1 series · 1 of 1 positions shown · non-contrast
Comparison: [DATE]

CLINICAL DATA: SOB

EXAM:
PORTABLE CHEST 1 VIEW

[AP]
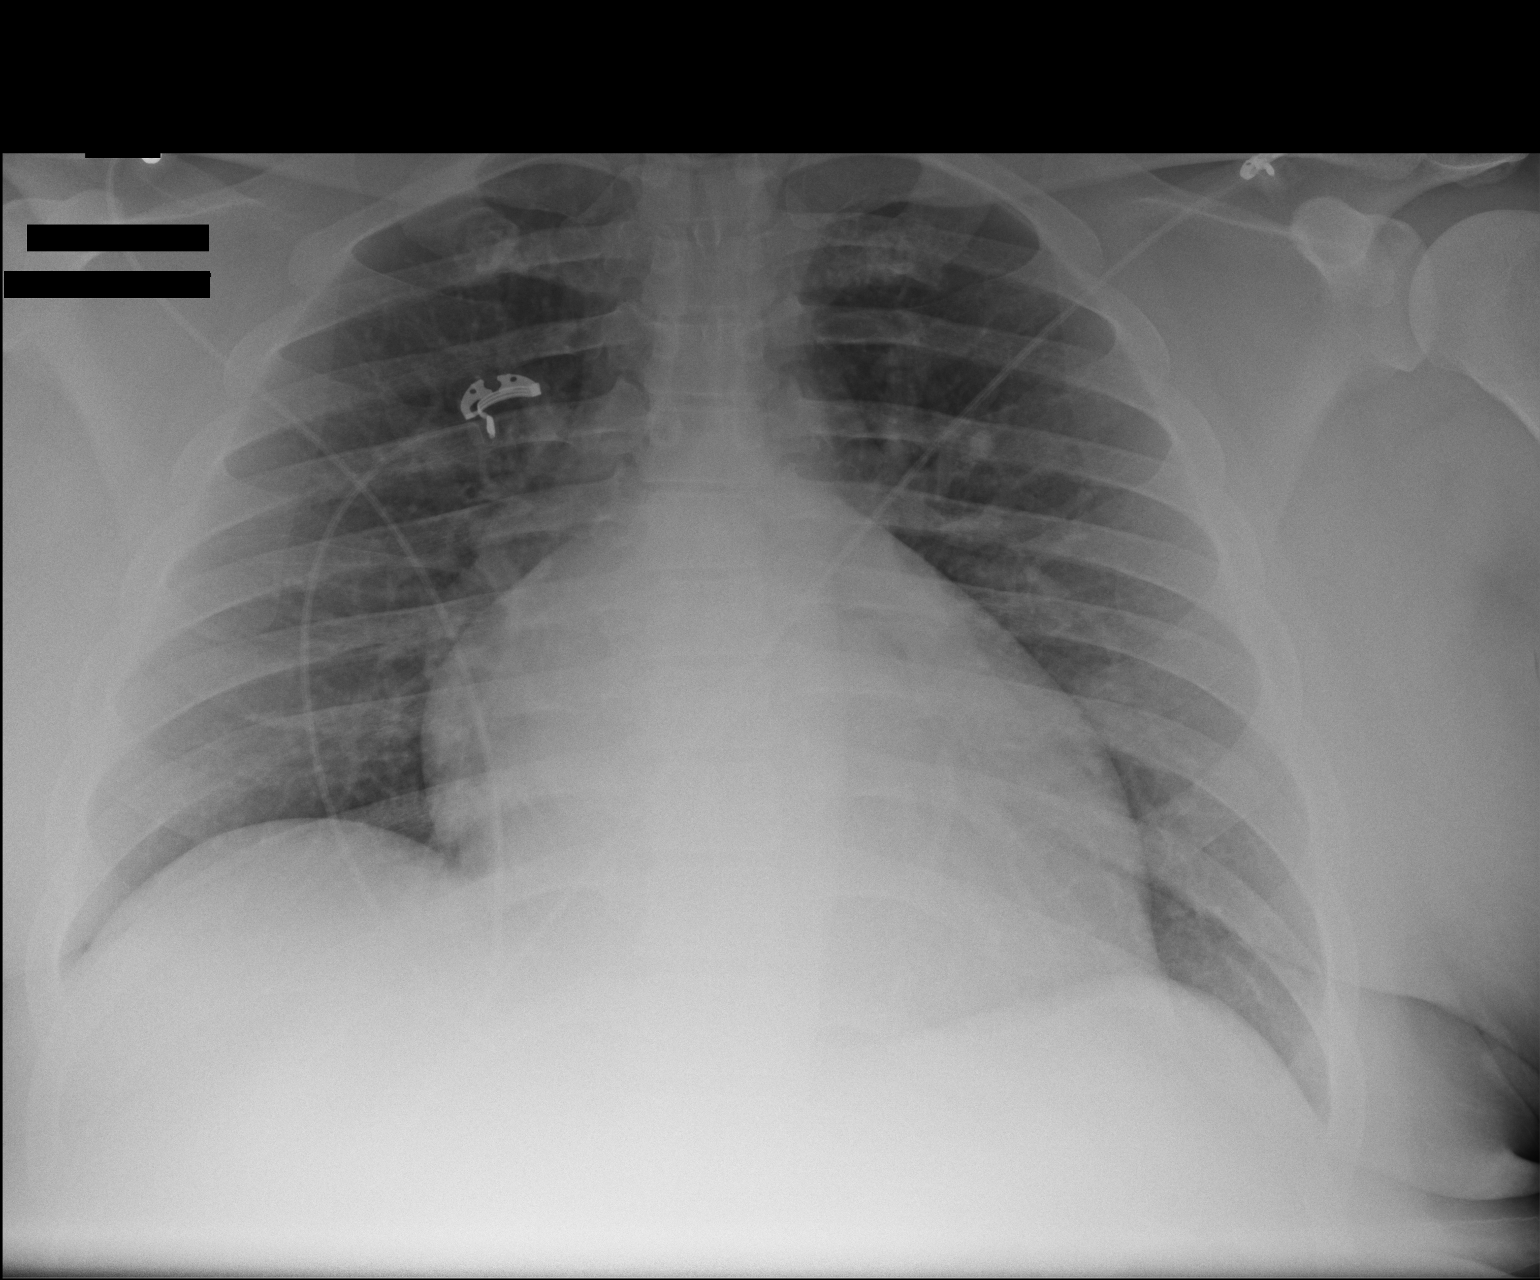

[1 of 1 positions shown; findings below may reference images not displayed]

FINDINGS: Cardiac silhouette is enlarged. Exam is lordotic. No effusion,
infiltrate, or pneumothorax.
IMPRESSION: Cardiomegaly without acute cardiopulmonary process.

## 2017-02-25 MED ORDER — FUROSEMIDE 10 MG/ML IJ SOLN
80.0000 mg | Freq: Three times a day (TID) | INTRAMUSCULAR | Status: DC
  Administered 2017-02-25 – 2017-02-28 (×9): 80 mg via INTRAVENOUS
  Filled 2017-02-25 (×8): qty 8

## 2017-02-25 NOTE — Progress Notes (Signed)
Progress Note  Patient Name: Bradley Powell Date of Encounter: 02/25/2017  Primary Cardiologist: Dr. Anne Fu  Subjective   Breathing at baseline. No chest pain or palpitations. Still with significant lower extremity edema and scrotal edema.   Inpatient Medications    Scheduled Meds: . aspirin EC  81 mg Oral Daily  . furosemide  80 mg Intravenous BID  . heparin  5,000 Units Subcutaneous Q8H  . lisinopril  5 mg Oral Daily  . potassium chloride  20 mEq Oral BID  . sodium chloride flush  3 mL Intravenous Q12H   Continuous Infusions:  PRN Meds: sodium chloride, acetaminophen, ALPRAZolam, ondansetron (ZOFRAN) IV, sodium chloride flush, zolpidem   Vital Signs    Vitals:   02/24/17 2100 02/25/17 0051 02/25/17 0603 02/25/17 1014  BP: 130/77 103/68 121/82 109/61  Pulse: (!) 110 (!) 106 (!) 108 (!) 111  Resp: 18 18 18    Temp: 98.9 F (37.2 C) 98.6 F (37 C) 98.4 F (36.9 C)   TempSrc: Oral Oral Oral   SpO2:  100% 95% 95%  Weight:   (!) 323 lb 14.4 oz (146.9 kg)   Height:        Intake/Output Summary (Last 24 hours) at 02/25/17 1039 Last data filed at 02/25/17 1019  Gross per 24 hour  Intake              898 ml  Output             1850 ml  Net             -952 ml   Filed Weights   02/24/17 1438 02/25/17 0603  Weight: (!) 326 lb 8 oz (148.1 kg) (!) 323 lb 14.4 oz (146.9 kg)    Telemetry    Sinus tachycardia, HR in low-100's to 110's.  - Personally Reviewed  ECG    No EKG's on file in Epic. Will obtain.  Physical Exam   General: Well developed, morbidly obese African American male appearing in no acute distress. Head: Normocephalic, atraumatic.  Neck: Supple without bruits, JVD at 8cm. Lungs:  Resp regular and unlabored, CTA without wheezing or rales. Heart: Regular rhythm, tachycardiac rate, S1, S2, no S3, S4, or murmur; no rub. Abdomen: Soft, non-tender, non-distended with normoactive bowel sounds. No hepatomegaly. No rebound/guarding. No obvious  abdominal masses. Extremities: No clubbing or cyanosis. 2+ pitting edema up to thighs bilaterally. Distal pedal pulses are 2+ bilaterally. Significant scrotal edema.  Neuro: Alert and oriented X 3. Moves all extremities spontaneously. Psych: Normal affect.  Labs    Chemistry Recent Labs Lab 02/19/17 1833 02/24/17 1502 02/25/17 0255  NA 141 140 139  K 3.8 3.9 4.5  CL 109 106 102  CO2 25 25 25   GLUCOSE 106* 100* 115*  BUN 14 20 20   CREATININE 1.30* 1.27* 1.26*  CALCIUM 9.0 9.1 9.3  PROT 6.8 6.6  --   ALBUMIN 3.5 3.3*  --   AST 30 32  --   ALT 28 30  --   ALKPHOS 59 66  --   BILITOT 4.1* 4.3*  --   GFRNONAA >60 >60 >60  GFRAA >60 >60 >60  ANIONGAP 7 9 12      Hematology Recent Labs Lab 02/19/17 1833 02/24/17 1502  WBC 6.6 7.7  RBC 5.17 5.09  HGB 13.7 13.5  HCT 43.2 42.2  MCV 83.6 82.9  MCH 26.5 26.5  MCHC 31.7 32.0  RDW 15.4 15.4  PLT 201 215    Cardiac  Enzymes Recent Labs Lab 02/24/17 1502  TROPONINI 0.04*   No results for input(s): TROPIPOC in the last 168 hours.   BNP Recent Labs Lab 02/19/17 1834 02/24/17 1508  BNP 340.8* 361.6*     DDimer No results for input(s): DDIMER in the last 168 hours.   Radiology    Dg Chest Port 1 View  Result Date: 02/25/2017 CLINICAL DATA:  SOB EXAM: PORTABLE CHEST 1 VIEW COMPARISON:  02/19/2017 FINDINGS: Cardiac silhouette is enlarged. Exam is lordotic. No effusion, infiltrate, or pneumothorax. IMPRESSION: Cardiomegaly without acute cardiopulmonary process. Electronically Signed   By: Genevive Bi M.D.   On: 02/25/2017 08:42    Cardiac Studies   Echocardiogram: 02/2017 Study Conclusions  - Left ventricle: The cavity size was normal. Systolic function was   mildly to moderately reduced. The estimated ejection fraction was   in the range of 40% to 45%. Wall motion was normal; there were no   regional wall motion abnormalities. Features are consistent with   a pseudonormal left ventricular filling  pattern, with concomitant   abnormal relaxation and increased filling pressure (grade 2   diastolic dysfunction). - Mitral valve: There was mild to moderate regurgitation. - Left atrium: The atrium was mildly dilated. Anterior-posterior   dimension: 46 mm. - Pulmonary arteries: Systolic pressure was mildly increased. - Pericardium, extracardiac: A trivial pericardial effusion was   identified posterior to the heart.  Patient Profile     36 y.o. male with newly diagnosed acute combined systolic and diastolic CHF (echo from 02/22/2017 showing an EF of 40-45% with no WMA) who was seen in the office by Dr. Anne Fu on 4/13 as a new patient evaluation and directly admitted for IV diuresis.   Assessment & Plan   1. Acute systolic and diastolic heart failure - recent echo showed a reduced EF of 40-45%, diffuse HK, and Grade 2 DD. Examined in the office and noted to be significantly volume overloaded. Reported weighing 260 lbs in 2017, at 333 lbs during his office visit. - BNP at 361. TSH checked by PCP and normal. Possibly tachycardia-medicated as HR has remained > 100 bpm since admission.  - he has been started on IV Lasix 80mg  BID. Net output of -1.1L with weight down 3 lbs. Creatinine stable at 1.26. Consider increasing Lasix dosing to TID if kidney function allows as he remains significantly volume overloaded.  - continue Lisinopril 5mg  daily. Will plan for addition of Coreg once acute exacerbation has resolved.   2. Sinus tachycardia with first-degree AV block - plan for addition of Coreg once acute CHF exacerbation is resolved.   3. Scrotal edema - continues to have significant edema - will monitor for improvement as he diuresis.   4. Morbid obesity - importance of diet and exercise reviewed with the patient.   Lorri Frederick , PA-C 10:39 AM 02/25/2017 Pager: [phone]  History and all data above reviewed.  Patient examined.  I agree with the findings as above.  He  feels slightly better than previous.  No pain.   The patient exam reveals COR:RRR  ,  Lungs: Clear  ,  Abd: Positive bowel sounds, no rebound no guarding, Ext Anasarca  .  All available labs, radiology testing, previous records reviewed. Agree with documented assessment and plan. Massive volume overload.  I will increase the Lasix to TID.   Fayrene Fearing Lailyn Appelbaum  11:34 AM  02/25/2017

## 2017-02-25 NOTE — Progress Notes (Signed)
Dr. Catha Gosselin verbally  ordered to continue foley catheter due to aggressive diuresing. Patient oxygen drops up to 72%  during activity.

## 2017-02-25 NOTE — Progress Notes (Signed)
Oxygen 82%. Dr. Shirlee Latch at bedside.

## 2017-02-26 ENCOUNTER — Other Ambulatory Visit: Payer: Self-pay

## 2017-02-26 LAB — BASIC METABOLIC PANEL
Anion gap: 11 (ref 5–15)
BUN: 20 mg/dL (ref 6–20)
CO2: 24 mmol/L (ref 22–32)
Calcium: 9.2 mg/dL (ref 8.9–10.3)
Chloride: 104 mmol/L (ref 101–111)
Creatinine, Ser: 1.26 mg/dL — ABNORMAL HIGH (ref 0.61–1.24)
GFR calc Af Amer: >60 mL/min (ref >60)
Glucose, Bld: 102 mg/dL — ABNORMAL HIGH (ref 65–99)
POTASSIUM: 3.8 mmol/L (ref 3.5–5.1)
SODIUM: 139 mmol/L (ref 135–145)

## 2017-02-26 MED ORDER — POTASSIUM CHLORIDE CRYS ER 20 MEQ PO TBCR
40.0000 meq | EXTENDED_RELEASE_TABLET | Freq: Once | ORAL | Status: AC
  Administered 2017-02-26: 40 meq via ORAL
  Filled 2017-02-26: qty 2

## 2017-02-26 MED ORDER — METOLAZONE 2.5 MG PO TABS
2.5000 mg | ORAL_TABLET | Freq: Once | ORAL | Status: AC
  Administered 2017-02-26: 2.5 mg via ORAL
  Filled 2017-02-26: qty 1

## 2017-02-26 NOTE — Progress Notes (Signed)
Progress Note  Patient Name: Bradley Powell Date of Encounter: 02/26/2017  Primary Cardiologist: Dr. Anne Fu  Subjective   He is breathing OK.  He has a burning in his thighs he thinks related to his fluid  Inpatient Medications    Scheduled Meds: . aspirin EC  81 mg Oral Daily  . furosemide  80 mg Intravenous TID  . heparin  5,000 Units Subcutaneous Q8H  . lisinopril  5 mg Oral Daily  . potassium chloride  20 mEq Oral BID  . sodium chloride flush  3 mL Intravenous Q12H   Continuous Infusions:  PRN Meds: sodium chloride, acetaminophen, ALPRAZolam, ondansetron (ZOFRAN) IV, sodium chloride flush, zolpidem   Vital Signs    Vitals:   02/25/17 1214 02/25/17 2042 02/26/17 0123 02/26/17 0541  BP: 102/74 97/80 107/73 111/81  Pulse: (!) 103 (!) 105 (!) 107 (!) 105  Resp: 20 18 18    Temp: 98.2 F (36.8 C) 97.4 F (36.3 C) 98.1 F (36.7 C) 98.1 F (36.7 C)  TempSrc: Oral Oral Oral Oral  SpO2: 97% 100% 99% 97%  Weight:    (!) 322 lb 14.4 oz (146.5 kg)  Height:        Intake/Output Summary (Last 24 hours) at 02/26/17 0856 Last data filed at 02/26/17 0540  Gross per 24 hour  Intake              963 ml  Output             3275 ml  Net            -2312 ml   Filed Weights   02/24/17 1438 02/25/17 0603 02/26/17 0541  Weight: (!) 326 lb 8 oz (148.1 kg) (!) 323 lb 14.4 oz (146.9 kg) (!) 322 lb 14.4 oz (146.5 kg)    Telemetry    Regular rate with ectopy and first degree AV block.  Rate in the 110s.   - Personally Reviewed  ECG    Sinus tachycardia with RAE, first degree AV block, poor anterior R wave progression, PACs, low voltage limb leads. - Personally Reviewed  Physical Exam   GEN: No acute distress.   Neck: No JVD Cardiac: RRR, NO murmurs, rubs, or gallops.  Positive S4 Respiratory: Clear  to auscultation bilaterally. GI: Soft, nontender, non-distended  MS: No severe leg and scrotal edema; No deformity. Neuro:  Nonfocal  Psych: Normal affect   Labs    Chemistry Recent Labs Lab 02/19/17 1833 02/24/17 1502 02/25/17 0255 02/26/17 0359  NA 141 140 139 139  K 3.8 3.9 4.5 3.8  CL 109 106 102 104  CO2 25 25 25 24   GLUCOSE 106* 100* 115* 102*  BUN 14 20 20 20   CREATININE 1.30* 1.27* 1.26* 1.26*  CALCIUM 9.0 9.1 9.3 9.2  PROT 6.8 6.6  --   --   ALBUMIN 3.5 3.3*  --   --   AST 30 32  --   --   ALT 28 30  --   --   ALKPHOS 59 66  --   --   BILITOT 4.1* 4.3*  --   --   GFRNONAA >60 >60 >60 >60  GFRAA >60 >60 >60 >60  ANIONGAP 7 9 12 11      Hematology Recent Labs Lab 02/19/17 1833 02/24/17 1502  WBC 6.6 7.7  RBC 5.17 5.09  HGB 13.7 13.5  HCT 43.2 42.2  MCV 83.6 82.9  MCH 26.5 26.5  MCHC 31.7 32.0  RDW 15.4  15.4  PLT 201 215    Cardiac Enzymes Recent Labs Lab 02/24/17 1502  TROPONINI 0.04*   No results for input(s): TROPIPOC in the last 168 hours.   BNP Recent Labs Lab 02/19/17 1834 02/24/17 1508  BNP 340.8* 361.6*     DDimer No results for input(s): DDIMER in the last 168 hours.   Radiology    Dg Chest Port 1 View  Result Date: 02/25/2017 CLINICAL DATA:  SOB EXAM: PORTABLE CHEST 1 VIEW COMPARISON:  02/19/2017 FINDINGS: Cardiac silhouette is enlarged. Exam is lordotic. No effusion, infiltrate, or pneumothorax. IMPRESSION: Cardiomegaly without acute cardiopulmonary process. Electronically Signed   By: Genevive Bi M.D.   On: 02/25/2017 08:42    Cardiac Studies   ECHO - Left ventricle: The cavity size was normal. Systolic function was   mildly to moderately reduced. The estimated ejection fraction was   in the range of 40% to 45%. Wall motion was normal; there were no   regional wall motion abnormalities. Features are consistent with   a pseudonormal left ventricular filling pattern, with concomitant   abnormal relaxation and increased filling pressure (grade 2   diastolic dysfunction). - Mitral valve: There was mild to moderate regurgitation. - Left atrium: The atrium was mildly dilated.  Anterior-posterior   dimension: 46 mm. - Pulmonary arteries: Systolic pressure was mildly increased. - Pericardium, extracardiac: A trivial pericardial effusion was   identified posterior to the heart.  Patient Profile     35 y.o. male with newly diagnosed acute combined systolic and diastolic CHF (echo from 02/22/2017 showing an EF of 40-45% with no WMA) who was seen in the office by Dr. Anne Fu on 4/13 as a new patient evaluation and directly admitted for IV diuresis.  Assessment & Plan    ANASARCA:  Down 3.5 liters since admission and 2.4 yesterday.   I increased the Lasix and will give one dose of PO metolazone today.    ACUTE ON CHRONIC SYSTOLIC AND DIASTOLIC HF:  See above.  Started beta blocker and somewhat low BPs will not allow med titration today.  I reviewed his echo images and ordered an EKG. He has significant first degree AV block.  RAD.  Consider MRI as an out patient once he is improved to further evaluate the etiology of his cardiomyopathy.    TACHYCARDIA:   Sinus tach with significant first degree AV block.  Rate is slightly improved with beta blocker.  Can follow rate as an outpatient and hopefully titrate the beta blocker over time.   Signed, Rollene Rotunda, MD  02/26/2017, 8:56 AM

## 2017-02-27 LAB — BASIC METABOLIC PANEL
Anion gap: 12 (ref 5–15)
BUN: 20 mg/dL (ref 6–20)
CHLORIDE: 97 mmol/L — AB (ref 101–111)
CO2: 30 mmol/L (ref 22–32)
CREATININE: 1.34 mg/dL — AB (ref 0.61–1.24)
Calcium: 9.3 mg/dL (ref 8.9–10.3)
GFR calc non Af Amer: >60 mL/min (ref >60)
Glucose, Bld: 120 mg/dL — ABNORMAL HIGH (ref 65–99)
POTASSIUM: 3.8 mmol/L (ref 3.5–5.1)
SODIUM: 139 mmol/L (ref 135–145)

## 2017-02-27 MED ORDER — METOPROLOL TARTRATE 25 MG PO TABS
25.0000 mg | ORAL_TABLET | Freq: Two times a day (BID) | ORAL | Status: DC
  Administered 2017-02-27 – 2017-02-28 (×4): 25 mg via ORAL
  Filled 2017-02-27 (×5): qty 1

## 2017-02-27 NOTE — Progress Notes (Signed)
Progress Note  Patient Name: Bradley Powell Date of Encounter: 02/27/2017  Primary Cardiologist: Anne Fu    Subjective   Breathing is OK  No CP    Inpatient Medications    Scheduled Meds: . aspirin EC  81 mg Oral Daily  . furosemide  80 mg Intravenous TID  . heparin  5,000 Units Subcutaneous Q8H  . lisinopril  5 mg Oral Daily  . potassium chloride  20 mEq Oral BID  . sodium chloride flush  3 mL Intravenous Q12H   Continuous Infusions:  PRN Meds: sodium chloride, acetaminophen, ALPRAZolam, ondansetron (ZOFRAN) IV, sodium chloride flush, zolpidem   Vital Signs    Vitals:   02/26/17 1214 02/26/17 2042 02/27/17 0608 02/27/17 0925  BP: 127/83 109/74 128/83 108/72  Pulse: (!) 116 80 (!) 103 (!) 105  Resp: 20 18 18 16   Temp: 98.3 F (36.8 C) 97.7 F (36.5 C) 97.6 F (36.4 C) 98.1 F (36.7 C)  TempSrc: Oral Oral Oral Oral  SpO2: 100% 99% 98% 97%  Weight:   (!) 312 lb (141.5 kg)   Height:        Intake/Output Summary (Last 24 hours) at 02/27/17 0928 Last data filed at 02/27/17 0900  Gross per 24 hour  Intake             1203 ml  Output             5725 ml  Net            -4522 ml  \ Net negative 8 L    Filed Weights   02/25/17 0603 02/26/17 0541 02/27/17 0608  Weight: (!) 323 lb 14.4 oz (146.9 kg) (!) 322 lb 14.4 oz (146.5 kg) (!) 312 lb (141.5 kg)    Telemetry    Tachycardia (? ST vs EAT  100s  )- Personally Reviewed  ECG    4/15:  ST vs EAT  105  First degree AV block  PR  Poss IWMI  Low voltage    Physical Exam   GEN: No acute distress.   Neck: Neck is full   Cardiac: RRR, no murmurs, rubs, or gallops.  Respiratory: Clear to auscultation bilaterally. GI: Soft, nontender, non-distended  MS: 1-2+ edema; No deformity. Neuro:  Nonfocal  Psych: Normal affect   Labs    Chemistry Recent Labs Lab 02/24/17 1502 02/25/17 0255 02/26/17 0359 02/27/17 0305  NA 140 139 139 139  K 3.9 4.5 3.8 3.8  CL 106 102 104 97*  CO2 25 25 24 30     GLUCOSE 100* 115* 102* 120*  BUN 20 20 20 20   CREATININE 1.27* 1.26* 1.26* 1.34*  CALCIUM 9.1 9.3 9.2 9.3  PROT 6.6  --   --   --   ALBUMIN 3.3*  --   --   --   AST 32  --   --   --   ALT 30  --   --   --   ALKPHOS 66  --   --   --   BILITOT 4.3*  --   --   --   GFRNONAA >60 >60 >60 >60  GFRAA >60 >60 >60 >60  ANIONGAP 9 12 11 12      Hematology Recent Labs Lab 02/24/17 1502  WBC 7.7  RBC 5.09  HGB 13.5  HCT 42.2  MCV 82.9  MCH 26.5  MCHC 32.0  RDW 15.4  PLT 215    Cardiac Enzymes Recent Labs Lab 02/24/17 1502  TROPONINI 0.04*   No results for input(s): TROPIPOC in the last 168 hours.   BNP Recent Labs Lab 02/24/17 1508  BNP 361.6*     DDimer No results for input(s): DDIMER in the last 168 hours.   Radiology    No results found.  Cardiac Studies    Patient Profile     36 y.o. male with new dx of combined systolic / diastolic CHF  Echo from4/11/18 LVEF 40 to 45%.  Admitted with anasarca    Assessment & Plan    1  Acute on chronic systolic/diastolic CHF   Still volume overloaded  Etiology has not been defined yed  Would countine IV diuresis   2  Tachycardia  WIll review with EP   HR still increased  Would  Add low dose metoprolol to regimen and follow      Signed, Dietrich Pates, MD  02/27/2017, 9:28 AM

## 2017-02-28 LAB — BASIC METABOLIC PANEL
Anion gap: 12 (ref 5–15)
Anion gap: 12 (ref 5–15)
BUN: 22 mg/dL — AB (ref 6–20)
BUN: 23 mg/dL — AB (ref 6–20)
CHLORIDE: 98 mmol/L — AB (ref 101–111)
CHLORIDE: 100 mmol/L — AB (ref 101–111)
CO2: 27 mmol/L (ref 22–32)
CO2: 24 mmol/L (ref 22–32)
CREATININE: 1.37 mg/dL — AB (ref 0.61–1.24)
CREATININE: 1.3 mg/dL — AB (ref 0.61–1.24)
Calcium: 9.4 mg/dL (ref 8.9–10.3)
Calcium: 9.5 mg/dL (ref 8.9–10.3)
GFR calc non Af Amer: >60 mL/min (ref >60)
GFR calc non Af Amer: >60 mL/min (ref >60)
Glucose, Bld: 137 mg/dL — ABNORMAL HIGH (ref 65–99)
Glucose, Bld: 116 mg/dL — ABNORMAL HIGH (ref 65–99)
POTASSIUM: 3.5 mmol/L (ref 3.5–5.1)
Potassium: 4.2 mmol/L (ref 3.5–5.1)
SODIUM: 137 mmol/L (ref 135–145)
SODIUM: 136 mmol/L (ref 135–145)

## 2017-02-28 MED ORDER — SODIUM CHLORIDE 0.9% FLUSH: 3.0000 mL | Freq: Two times a day (BID) | INTRAVENOUS | Status: DC

## 2017-02-28 MED ORDER — SODIUM CHLORIDE 0.9% FLUSH: 3.0000 mL | INTRAVENOUS | Status: DC | PRN

## 2017-02-28 MED ORDER — SODIUM CHLORIDE 0.9 % IV SOLN: 250.0000 mL | INTRAVENOUS | Status: DC | PRN

## 2017-02-28 MED ORDER — SODIUM CHLORIDE 0.9 % IV SOLN
INTRAVENOUS | Status: DC
  Administered 2017-03-01: 06:00:00 via INTRAVENOUS

## 2017-02-28 MED ORDER — ASPIRIN 81 MG PO CHEW
81.0000 mg | CHEWABLE_TABLET | ORAL | Status: AC
  Administered 2017-03-01: 81 mg via ORAL
  Filled 2017-02-28: qty 1

## 2017-02-28 NOTE — Progress Notes (Signed)
Offered Pt a bath, Pt stated that he will take care of bath later. Tech gave Pt all bath supplies.

## 2017-02-28 NOTE — Progress Notes (Signed)
Progress Note  Patient Name: Bradley Powell Date of Encounter: 02/28/2017  Primary Cardiologist: Skains    Subjective   Breathing OK  No CP    Inpatient Medications    Scheduled Meds: . aspirin EC  81 mg Oral Daily  . furosemide  80 mg Intravenous TID  . heparin  5,000 Units Subcutaneous Q8H  . lisinopril  5 mg Oral Daily  . metoprolol tartrate  25 mg Oral BID  . potassium chloride  20 mEq Oral BID  . sodium chloride flush  3 mL Intravenous Q12H   Continuous Infusions:  PRN Meds: sodium chloride, acetaminophen, ALPRAZolam, ondansetron (ZOFRAN) IV, sodium chloride flush, zolpidem   Vital Signs    Vitals:   02/27/17 0925 02/27/17 1351 02/27/17 1957 02/28/17 0502  BP: 108/72 101/69 103/71 98/63  Pulse: (!) 105 (!) 104 (!) 102 93  Resp: 16 18 18 18   Temp: 98.1 F (36.7 C) 97.5 F (36.4 C) 98.1 F (36.7 C) 97.5 F (36.4 C)  TempSrc: Oral Oral Oral Oral  SpO2: 97% 98% 98% 97%  Weight:    (!) 306 lb 12.8 oz (139.2 kg)  Height:        Intake/Output Summary (Last 24 hours) at 02/28/17 0718 Last data filed at 02/28/17 0240  Gross per 24 hour  Intake              723 ml  Output             3625 ml  Net           [place] 6Vernard GShrineRollK[place]and 6RollKentucVernKentuckyRReita ChardlKenVernard GId[facility] NorJannifer FranklKatrinka BlaGi Or TTAG>eased   Cardiac: RRR, no murmurs, rubs, or gallops.  Respiratory: Clear to auscultation bilaterally. GI: Soft, nontender, non-distended  GU  Scrotal edema   MS:  Tr to 1+ edema; No deformity. Neuro:  Nonfocal  Psych: Normal affect   Labs    Chemistry Recent Labs Lab 02/24/17 1502  02/26/17 0359 02/27/17 0305 02/28/17 0305  NA 140  < > 139 139 137    K 3.9  < > 3.8 3.8 3.5  CL 106  < > 104 97* 98*  CO2 25  < > 24 30 27   GLUCOSE 100*  < > 102* 120* 137*  BUN 20  < > 20 20 22*  CREATININE 1.27*  < > 1.26* 1.34* 1.37*  CALCIUM 9.1  < > 9.2 9.3 9.4  PROT 6.6  --   --   --   --   ALBUMIN 3.3*  --   --   --   --   AST 32  --   --   --   --   ALT 30  --   --   --   --   ALKPHOS 66  --   --   --   --   BILITOT 4.3*  --   --   --   --   GFRNONAA >60  < > >  60 >60 >60  GFRAA >60  < > >60 >60 >60  ANIONGAP 9  < > 11 12 12  < > = values in this interval not displayed.   Hematology  Recent Labs Lab 02/24/17 1502  WBC 7.7  RBC 5.09  HGB 13.5  HCT 42.2  MCV 82.9  MCH 26.5  MCHC 32.0  RDW 15.4  PLT 215    Cardiac Enzymes  Recent Labs Lab 02/24/17 1502  TROPONINI 0.04*   No results for input(s): TROPIPOC in the last 168 hours.   BNP  Recent Labs Lab 02/24/17 1508  BNP 361.6*     DDimer No results for input(s): DDIMER in the last 168 hours.   Radiology    No results found.  Cardiac Studies    Patient Profile     35 y.o. male with new dx of combined systolic / diastolic CHF  Echo from4/11/18 LVEF 40 to 45%.  Admitted with anasarca    Assessment & Plan    1  Acute on chronic systolic/diastolic CHF   Has continued to diurese but cr has bumped  Will hold IV lasix  Hold back on ACEI for now with Cr bump I have reviewed echo  Atria enlarged  RV enlarged LVEF is depressed, prob greater than 35 to 40% I have reviewed with D Bensimhon  Will plan on R and L heart catheterization tomorrow to define pressures/anatomy  2  Tachycardia  ST   HR better but still mildly elevated    Signed, Janifer Gieselman, MD  02/28/2017, 7:18 AM    

## 2017-02-28 NOTE — Care Management Note (Signed)
Case Management Note  Patient Details  Name: Bradley Powell MRN: [id number] Date of Birth: 03-08-1981  Subjective/Objective:     Admitted with new onset CHF            Action/Plan: Patient is independent prior to admission, is a [employer]. JME:QASTMH,DQQIWL D, MD; he stated that he had private insurance but he was dropped and is in the progress of obtaining medical insurance with [facility]; pharmacy of choice is [facility] in [place], [place]; he has scales at home,patient is encouraged to weigh himself daily and states that he eats a low sodium diet. Patient states that he is very active but does not exercise consistently. CM will continue to follow for DCP  Expected Discharge Date:  Possibly 03/01/2017             Expected Discharge Plan:  Home/Self Care  In-House Referral:   Financial Counselor  Discharge planning Services  CM Consult  Status of Service:  In process, will continue to follow  Reola Mosher [phone] 02/28/2017, 10:13 AM

## 2017-03-01 ENCOUNTER — Encounter (HOSPITAL_COMMUNITY)
Admission: AD | Disposition: A | Discharge status: Home / Self Care | Payer: Self-pay | Source: Ambulatory Visit | Attending: Cardiology

## 2017-03-01 ENCOUNTER — Encounter (HOSPITAL_COMMUNITY): Payer: Self-pay | Admitting: Internal Medicine

## 2017-03-01 DIAGNOSIS — R29818 Other symptoms and signs involving the nervous system: Secondary | ICD-10-CM

## 2017-03-01 HISTORY — PX: RIGHT/LEFT HEART CATH AND CORONARY ANGIOGRAPHY: CATH118266

## 2017-03-01 LAB — POCT I-STAT 3, VENOUS BLOOD GAS (G3P V)
ACID-BASE EXCESS: 1 mmol/L (ref 0.0–2.0)
Acid-Base Excess: 1 mmol/L (ref 0.0–2.0)
Acid-Base Excess: 1 mmol/L (ref 0.0–2.0)
Acid-Base Excess: 1 mmol/L (ref 0.0–2.0)
Acid-Base Excess: 5 mmol/L — ABNORMAL HIGH (ref 0.0–2.0)
BICARBONATE: 26.9 mmol/L (ref 20.0–28.0)
BICARBONATE: 27.5 mmol/L (ref 20.0–28.0)
BICARBONATE: 27.5 mmol/L (ref 20.0–28.0)
BICARBONATE: 27.8 mmol/L (ref 20.0–28.0)
Bicarbonate: 31.2 mmol/L — ABNORMAL HIGH (ref 20.0–28.0)
O2 SAT: 69 %
O2 SAT: 82 %
O2 SAT: 82 %
O2 Saturation: 66 %
O2 Saturation: 71 %
PCO2 VEN: 47.6 mmHg (ref 44.0–60.0)
PCO2 VEN: 47.8 mmHg (ref 44.0–60.0)
PCO2 VEN: 49.3 mmHg (ref 44.0–60.0)
PCO2 VEN: 50.5 mmHg (ref 44.0–60.0)
PCO2 VEN: 51 mmHg (ref 44.0–60.0)
PH VEN: 7.348 (ref 7.250–7.430)
PO2 VEN: 34 mmHg (ref 32.0–45.0)
PO2 VEN: 39 mmHg (ref 32.0–45.0)
PO2 VEN: 40 mmHg (ref 32.0–45.0)
PO2 VEN: 49 mmHg — AB (ref 32.0–45.0)
TCO2: 29 mmol/L (ref 0–100)
TCO2: 29 mmol/L (ref 0–100)
TCO2: 29 mmol/L (ref 0–100)
TCO2: 28 mmol/L (ref 0–100)
TCO2: 33 mmol/L (ref 0–100)
pH, Ven: 7.34 (ref 7.250–7.430)
pH, Ven: 7.359 (ref 7.250–7.430)
pH, Ven: 7.369 (ref 7.250–7.430)
pH, Ven: 7.409 (ref 7.250–7.430)
pO2, Ven: 49 mmHg — ABNORMAL HIGH (ref 32.0–45.0)

## 2017-03-01 LAB — POCT I-STAT 3, ART BLOOD GAS (G3+)
Acid-base deficit: 1 mmol/L (ref 0.0–2.0)
BICARBONATE: 24.9 mmol/L (ref 20.0–28.0)
O2 Saturation: 98 %
PH ART: 7.338 — AB (ref 7.350–7.450)
PO2 ART: 105 mmHg (ref 83.0–108.0)
TCO2: 26 mmol/L (ref 0–100)
pCO2 arterial: 46.4 mmHg (ref 32.0–48.0)

## 2017-03-01 LAB — BASIC METABOLIC PANEL
ANION GAP: 10 (ref 5–15)
BUN: 22 mg/dL — AB (ref 6–20)
CHLORIDE: 100 mmol/L — AB (ref 101–111)
CO2: 26 mmol/L (ref 22–32)
Calcium: 9.4 mg/dL (ref 8.9–10.3)
Creatinine, Ser: 1.23 mg/dL (ref 0.61–1.24)
GFR calc Af Amer: >60 mL/min (ref >60)
GLUCOSE: 115 mg/dL — AB (ref 65–99)
POTASSIUM: 3.8 mmol/L (ref 3.5–5.1)
Sodium: 136 mmol/L (ref 135–145)

## 2017-03-01 LAB — POCT ACTIVATED CLOTTING TIME: ACTIVATED CLOTTING TIME: 180 s

## 2017-03-01 SURGERY — RIGHT/LEFT HEART CATH AND CORONARY ANGIOGRAPHY
Anesthesia: LOCAL

## 2017-03-01 MED ORDER — IOPAMIDOL (ISOVUE-370) INJECTION 76%
INTRAVENOUS | Status: DC | PRN
  Administered 2017-03-01: 45 mL via INTRA_ARTERIAL

## 2017-03-01 MED ORDER — SPIRONOLACTONE 25 MG PO TABS
12.5000 mg | ORAL_TABLET | Freq: Every day | ORAL | Status: DC
  Administered 2017-03-01 – 2017-03-02 (×2): 12.5 mg via ORAL
  Filled 2017-03-01 (×2): qty 1

## 2017-03-01 MED ORDER — HEPARIN SODIUM (PORCINE) 1000 UNIT/ML IJ SOLN
INTRAMUSCULAR | Status: AC
  Filled 2017-03-01: qty 1

## 2017-03-01 MED ORDER — VERAPAMIL HCL 2.5 MG/ML IV SOLN
INTRAVENOUS | Status: AC
  Filled 2017-03-01: qty 2

## 2017-03-01 MED ORDER — MIDAZOLAM HCL 2 MG/2ML IJ SOLN
INTRAMUSCULAR | Status: AC
  Filled 2017-03-01: qty 2

## 2017-03-01 MED ORDER — FUROSEMIDE 10 MG/ML IJ SOLN
80.0000 mg | Freq: Three times a day (TID) | INTRAMUSCULAR | Status: DC
  Administered 2017-03-01 – 2017-03-05 (×12): 80 mg via INTRAVENOUS
  Filled 2017-03-01 (×13): qty 8

## 2017-03-01 MED ORDER — ACETAMINOPHEN 325 MG PO TABS
650.0000 mg | ORAL_TABLET | ORAL | Status: DC | PRN
  Administered 2017-03-04: 650 mg via ORAL
  Filled 2017-03-01: qty 2

## 2017-03-01 MED ORDER — SODIUM CHLORIDE 0.9 % IV SOLN: 250.0000 mL | INTRAVENOUS | Status: DC | PRN

## 2017-03-01 MED ORDER — ONDANSETRON HCL 4 MG/2ML IJ SOLN: 4.0000 mg | Freq: Four times a day (QID) | INTRAMUSCULAR | Status: DC | PRN

## 2017-03-01 MED ORDER — VERAPAMIL HCL 2.5 MG/ML IV SOLN
INTRAVENOUS | Status: DC | PRN
  Administered 2017-03-01: 10 mL via INTRA_ARTERIAL

## 2017-03-01 MED ORDER — HEPARIN SODIUM (PORCINE) 1000 UNIT/ML IJ SOLN
INTRAMUSCULAR | Status: DC | PRN
  Administered 2017-03-01: 5000 [IU] via INTRAVENOUS

## 2017-03-01 MED ORDER — SODIUM CHLORIDE 0.9% FLUSH: 3.0000 mL | INTRAVENOUS | Status: DC | PRN

## 2017-03-01 MED ORDER — LIDOCAINE HCL (PF) 1 % IJ SOLN
INTRAMUSCULAR | Status: DC | PRN
  Administered 2017-03-01: 5 mL via SUBCUTANEOUS

## 2017-03-01 MED ORDER — FENTANYL CITRATE (PF) 100 MCG/2ML IJ SOLN
INTRAMUSCULAR | Status: DC | PRN
  Administered 2017-03-01: 50 ug via INTRAVENOUS

## 2017-03-01 MED ORDER — LIDOCAINE HCL 1 % IJ SOLN
INTRAMUSCULAR | Status: AC
  Filled 2017-03-01: qty 20

## 2017-03-01 MED ORDER — HEPARIN (PORCINE) IN NACL 2-0.9 UNIT/ML-% IJ SOLN
INTRAMUSCULAR | Status: DC | PRN
  Administered 2017-03-01: 1000 mL

## 2017-03-01 MED ORDER — MIDAZOLAM HCL 2 MG/2ML IJ SOLN
INTRAMUSCULAR | Status: DC | PRN
  Administered 2017-03-01 (×2): 1 mg via INTRAVENOUS

## 2017-03-01 MED ORDER — SODIUM CHLORIDE 0.9% FLUSH
3.0000 mL | Freq: Two times a day (BID) | INTRAVENOUS | Status: DC
  Administered 2017-03-01 – 2017-03-05 (×8): 3 mL via INTRAVENOUS

## 2017-03-01 MED ORDER — HEPARIN (PORCINE) IN NACL 2-0.9 UNIT/ML-% IJ SOLN
INTRAMUSCULAR | Status: AC
  Filled 2017-03-01: qty 1000

## 2017-03-01 MED ORDER — FENTANYL CITRATE (PF) 100 MCG/2ML IJ SOLN
INTRAMUSCULAR | Status: AC
  Filled 2017-03-01: qty 2

## 2017-03-01 SURGICAL SUPPLY — 14 items
CATH 5FR JL3.5 JR4 ANG PIG MP (CATHETERS) ×2 IMPLANT
CATH SWAN GANZ 7F STRAIGHT (CATHETERS) ×1 IMPLANT
COVER PRB 48X5XTLSCP FOLD TPE (BAG) ×1 IMPLANT
COVER PROBE 5X48 (BAG) ×2
DEVICE RAD COMP TR BAND LRG (VASCULAR PRODUCTS) ×1 IMPLANT
GLIDESHEATH SLEND SS 6F .021 (SHEATH) ×1 IMPLANT
GUIDEWIRE INQWIRE 1.5J.035X260 (WIRE) IMPLANT
INQWIRE 1.5J .035X260CM (WIRE) ×2
KIT HEART LEFT (KITS) ×2 IMPLANT
PACK CARDIAC CATHETERIZATION (CUSTOM PROCEDURE TRAY) ×2 IMPLANT
SHEATH PINNACLE 7F 10CM (SHEATH) ×1 IMPLANT
TRANSDUCER W/STOPCOCK (MISCELLANEOUS) ×2 IMPLANT
TUBING CIL FLEX 10 FLL-RA (TUBING) ×2 IMPLANT
WIRE EMERALD 3MM-J .025X260CM (WIRE) ×1 IMPLANT

## 2017-03-01 NOTE — Consult Note (Signed)
Advanced Heart Failure Team Consult Note   Primary Physician: Dr Nehemiah Settle  Primary Cardiologist:  Dr Anne Fu   Reason for Consultation: Heart Failure   HPI:    Bradley Powell is seen today for evaluation of heart failure at the request of Dr Tenny Craw.   Referred to Dr Anne Fu earlier this month for newly reduced EF and saw him April 13th. Weight up 90 pounds over the last year. Due to massive anasarca he was sent to [facility] for IV diuresis.    Bradley Powell is a 36 year old with history of obesity, 1st degree heart block, newly diagnosed with systolic heart failure admitted with increased dyspnea and lower extremity edema. Pertinent admission labs include K 3.9, Creatinine 1.27, Hgb 13.5, BNP 340.8, Troponin 0.04. CXR on admit ok. He has been diuresing with IV lasix. Weight down 19 pounds. EKG on 4/15  showed Sinus Tach with 1st degree heart block.   Started on low dose bb. Today he underwent RHCLHC  to further evaluated.    RHC/LHC 03/01/2017 AO = 109/86 (97) LV =  101/28 RA =  29 RV = 50/22 PA = 55/24 (38) PCW = 26 Fick cardiac output/index = 6.4/2.6 Thermal CO/CI = 4.9/2.0 PVR = 2.5 WU FA sat = 98% PA sat = 69%, 71% SVC sat = 82%, 82% IVC sat = 66%  Assessment: 1. Normal coronary arteries  2. Mild to moderate pulmonary HTN with normal PVR 3. Biventricular volume overload with R > L heart failure 4. Moderately decreased CO by thermodilution  5. 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 No intra-cardiac shunt  02/2017 ECHO LV EF 40-45%. Moderate RV HK  Grade II DD.   Review of Systems: [y] = yes, [ ]  = no   General: Weight gain [Y ]; Weight loss [ ] ; Anorexia [ ] ; Fatigue [Y ]; Fever [ ] ; Chills [ ] ; Weakness [ ]   Cardiac: Chest pain/pressure [ ] ; Resting SOB [ ] ; Exertional SOB [Y ]; Orthopnea Bradley Powell ]; Pedal Edema [ Y]; Palpitations [ ] ; Syncope [ ] ; Presyncope [ ] ; Paroxysmal nocturnal dyspnea[ ]     Pulmonary: Cough Bradley Powell ]; Wheezing[ ] ; Hemoptysis[ ] ; Sputum [ ] ; Snoring [Y ]  GI: Vomiting[ ] ; Dysphagia[ ] ; Melena[ ] ; Hematochezia [ ] ; Heartburn[ ] ; Abdominal pain [ ] ; Constipation [ ] ; Diarrhea [ ] ; BRBPR [ ]   GU: Hematuria[ ] ; Dysuria [ ] ; Nocturia[ ]   Vascular: Pain in legs with walking [ ] ; Pain in feet with lying flat [ ] ; Non-healing sores [ ] ; Stroke [ ] ; TIA [ ] ; Slurred speech [ ] ;  Neuro: Headaches[ ] ; Vertigo[ ] ; Seizures[ ] ; Paresthesias[ ] ;Blurred vision [ ] ; Diplopia [ ] ; Vision changes [ ]   Ortho/Skin: Arthritis [Y ]; Joint pain [Y]; Muscle pain [ ] ; Joint swelling [ ] ; Back Pain [ ] ; Rash [ ]   Psych: Depression[ ] ; Anxiety[ ]   Heme: Bleeding problems [ ] ; Clotting disorders [ ] ; Anemia [ ]   Endocrine: Diabetes [ ] ; Thyroid dysfunction[ ]   Home Medications Prior to Admission medications   Medication Sig Start Date End Date Taking? Authorizing Provider  acetaminophen (TYLENOL) 500 MG tablet Take 500 mg by mouth every 6 (six) hours as needed (pain).   Yes Historical Provider, MD  furosemide (LASIX) 40 MG tablet Take 80 mg by mouth 2 (two) times daily.    Yes Historical Provider, MD  potassium chloride (K-DUR) 10 MEQ tablet  Take 10 mEq by mouth 2 (two) times daily.    Yes Historical Provider, MD    Past Medical History: Past Medical History:  Diagnosis Date  . History of esophagogastroduodenoscopy (EGD)     Past Surgical History: Past Surgical History:  Procedure Laterality Date  . NO PAST SURGERIES    . RIGHT/LEFT HEART CATH AND CORONARY ANGIOGRAPHY N/A 03/01/2017   Procedure: Right/Left Heart Cath and Coronary Angiography;  Surgeon: Dolores Patty, MD;  Location: [facility] INVASIVE CV LAB;  Service: Cardiovascular;  Laterality: N/A;    Family History: Family History  Problem Relation Age of Onset  . CVA Mother     pacemaker  . Multiple sclerosis Mother   . Seizures Mother   . Hypertension Father   . Gout Father     Social History: Social History   Social  History  . Marital status: Single    Spouse name: N/A  . Number of children: N/A  . Years of education: N/A   Social History Main Topics  . Smoking status: Never Smoker  . Smokeless tobacco: Never Used  . Alcohol use Yes     Comment: rarely  . Drug use: No  . Sexual activity: Not Asked   Other Topics Concern  . None   Social History Narrative  . None    Allergies:  No Known Allergies  Objective:    Vital Signs:   Temp:  [97.8 F (36.6 C)-98 F (36.7 C)] 98 F (36.7 C) (04/18 0532) Pulse Rate:  [0-146] 89 (04/18 1105) Resp:  [0-53] 19 (04/18 1013) BP: (90-134)/(63-90) 122/67 (04/18 1105) SpO2:  [0 %-100 %] 100 % (04/18 1105) Weight:  [307 lb 3.2 oz (139.3 kg)] 307 lb 3.2 oz (139.3 kg) (04/18 0532) Last BM Date: 02/27/17  Weight change: Filed Weights   02/27/17 0608 02/28/17 0502 03/01/17 0532  Weight: (!) 312 lb (141.5 kg) (!) 306 lb 12.8 oz (139.2 kg) (!) 307 lb 3.2 oz (139.3 kg)    Intake/Output:   Intake/Output Summary (Last 24 hours) at 03/01/17 1141 Last data filed at 03/01/17 0500  Gross per 24 hour  Intake              480 ml  Output             1501 ml  Net            -1021 ml     Physical Exam: General:  Morbidly obese. No resp difficulty. In bed  HEENT: normal Neck: supple. JVP difficulty to assess due to body habitus . Carotids 2+ bilat; no bruits. No lymphadenopathy or thyromegaly appreciated. Dressing RIJ intact Cor: PMI nonpalpable. Regular tachy. 2/6 TR Lungs: clear on room decreased BS throughout Abdomen: markedly obese,  soft, nontender, nondistended. No hepatosplenomegaly. No bruits or masses. Good bowel sounds. Extremities: no cyanosis, clubbing, rash, R and LLE 2-3+  Edema Severe scrotal edema Neuro: alert & orientedx3, cranial nerves grossly intact. moves all 4 extremities w/o difficulty. Affect pleasant GU;   Telemetry: Sinus Rhythm 1st degree heart block   Labs: Basic Metabolic Panel:  Recent Labs Lab 02/24/17 1502   02/26/17 0359 02/27/17 0305 02/28/17 0305 02/28/17 1641 03/01/17 0335  NA 140  < > 139 139 137 136 136  K 3.9  < > 3.8 3.8 3.5 4.2 3.8  CL 106  < > 104 97* 98* 100* 100*  CO2 25  < > 24 30 27 24 26   GLUCOSE 100*  < > 102* 120*  137* 116* 115*  BUN 20  < > 20 20 22* 23* 22*  CREATININE 1.27*  < > 1.26* 1.34* 1.37* 1.30* 1.23  CALCIUM 9.1  < > 9.2 9.3 9.4 9.5 9.4  MG 2.0  --   --   --   --   --   --   < > = values in this interval not displayed.  Liver Function Tests:  Recent Labs Lab 02/24/17 1502  AST 32  ALT 30  ALKPHOS 66  BILITOT 4.3*  PROT 6.6  ALBUMIN 3.3*   No results for input(s): LIPASE, AMYLASE in the last 168 hours. No results for input(s): AMMONIA in the last 168 hours.  CBC:  Recent Labs Lab 02/24/17 1502  WBC 7.7  NEUTROABS 5.2  HGB 13.5  HCT 42.2  MCV 82.9  PLT 215    Cardiac Enzymes:  Recent Labs Lab 02/24/17 1502  TROPONINI 0.04*    BNP: BNP (last 3 results)  Recent Labs  02/19/17 1834 02/24/17 1508  BNP 340.8* 361.6*    ProBNP (last 3 results) No results for input(s): PROBNP in the last 8760 hours.   CBG: No results for input(s): GLUCAP in the last 168 hours.  Coagulation Studies: No results for input(s): LABPROT, INR in the last 72 hours.  Other results: WUJ:WJXBJ Tach 105 bpm PR 222 ms   Imaging:  No results found.   Medications:     Current Medications: . aspirin EC  81 mg Oral Daily  . furosemide  80 mg Intravenous Q8H  . heparin  5,000 Units Subcutaneous Q8H  . metoprolol tartrate  25 mg Oral BID  . potassium chloride  20 mEq Oral BID  . sodium chloride flush  3 mL Intravenous Q12H  . sodium chloride flush  3 mL Intravenous Q12H  . spironolactone  12.5 mg Oral Daily     Infusions: . sodium chloride    . sodium chloride        Assessment/Plan   1. A/C Combined Systolic Diastolic Heart Failure. R>L Heart Failure. HIV NR.  RHC/LHC today with biventricular volume overload and normal coronaries.  Suspect sleep apnea contributing. Increase IV lasix to 80 mg three times a day. Continue 12.5 mg spiro daily. Anticipate adding losartan versus entresto tomorrow. Watch renal function closely.  Stop bb. Consider once fully diuresed.  2. Sinus Tach with 1st degree heart block . Check TSH .  3. Morbid Obesity - Body mass index is 46.03 kg/m.  Needs to lose a considerable amount of weight 4. Suspected Sleep Apnea- Set up for outpatient sleep study.    Length of Stay: 5  Amy Clegg, NP  03/01/2017, 11:41 AM  Advanced Heart Failure Team Pager [phone] (M-F; 7a - 4p)  Please contact [facility] for night-coverage after hours (4p -7a ) and weekends on [email or web address]  Patient seen and examined with [name], NP. We discussed all aspects of the encounter. I agree with the assessment and plan as stated above.   Echo images reviewed personally. EF ~40% with RV dyfunction Cath results reviewed with him. He has biventricular failure with marked volume overload.Suspect etiology in untreated OSA/OHS. Still with massive volume overload. Will resume IV lasix. Add spiro. Will need sleep study and probable Paramedicine.   Arvilla Meres, MD  3:21 PM

## 2017-03-01 NOTE — Progress Notes (Signed)
I stopped by to see patient and review HF Education.  He is currently sleeping quite soundly.  I will plan return when he is awake.

## 2017-03-01 NOTE — Progress Notes (Signed)
TR band removed at 1515. A 2x2 guaze with tegaderm was applied. Site remains level 0. R pulses remain 2+, extremity is warm to touch, normal color for ethnicity. Vital signs stable. Pt instructed to not put any pressure or use arm. Pt instructed to call if arm begins to bleed. Pt verbalized understanding. Will continue to monitor pt.  Wynona Neat RN

## 2017-03-01 NOTE — Interval H&P Note (Signed)
History and Physical Interval Note:  03/01/2017 8:49 AM  Bradley Powell  has presented today for surgery, with the diagnosis of hf  The various methods of treatment have been discussed with the patient and family. After consideration of risks, benefits and other options for treatment, the patient has consented to  Procedure(s): Right/Left Heart Cath and Coronary Angiography (N/A) and possible coronary angioplasty as a surgical intervention .  The patient's history has been reviewed, patient examined, no change in status, stable for surgery.  I have reviewed the patient's chart and labs.  Questions were answered to the patient's satisfaction.     Priscilla Kirstein, Reuel Boom

## 2017-03-01 NOTE — Progress Notes (Signed)
Progress Note  Patient Name: Bradley Powell Date of Encounter: 03/01/2017  Primary Cardiologist: Anne Fu    Subjective   Breathing OK  No CP  Pt sleepy after cath   Inpatient Medications    Scheduled Meds: . aspirin EC  81 mg Oral Daily  . heparin  5,000 Units Subcutaneous Q8H  . metoprolol tartrate  25 mg Oral BID  . potassium chloride  20 mEq Oral BID  . sodium chloride flush  3 mL Intravenous Q12H  . sodium chloride flush  3 mL Intravenous Q12H   Continuous Infusions: . sodium chloride    . sodium chloride    . sodium chloride 70 mL/hr at 03/01/17 0546   PRN Meds: sodium chloride, sodium chloride, acetaminophen, ALPRAZolam, ondansetron (ZOFRAN) IV, sodium chloride flush, sodium chloride flush, zolpidem   Vital Signs    Vitals:   02/28/17 0905 02/28/17 1202 02/28/17 1952 03/01/17 0532  BP: (!) 95/55 95/68 90/63  116/76  Pulse: 100 94 96 95  Resp:  20 18 18   Temp:  97.9 F (36.6 C) 97.8 F (36.6 C) 98 F (36.7 C)  TempSrc:  Oral Oral Oral  SpO2:  95% 95% 98%  Weight:    (!) 307 lb 3.2 oz (139.3 kg)  Height:        Intake/Output Summary (Last 24 hours) at 03/01/17 0839 Last data filed at 03/01/17 0500  Gross per 24 hour  Intake              720 ml  Output             1951 ml  Net            -1231 ml  \ Net negative 12.4 L    Filed Weights   02/27/17 0608 02/28/17 0502 03/01/17 0532  Weight: (!) 312 lb (141.5 kg) (!) 306 lb 12.8 oz (139.2 kg) (!) 307 lb 3.2 oz (139.3 kg)    Telemetry      ST   Personally Reviewed  ECG    4/15:  ST   105  First degree AV block  PR  Poss IWMI  Low voltage    Physical Exam   GEN: No acute distress.   Neck: Neck is full  JVP increased   Cardiac: RRR, no murmurs, rubs, or gallops.  Respiratory: Clear to auscultation bilaterally. GI: Soft, nontender, non-distended  GU  Scrotal edema   MS:  Tr to 1+ edema; No deformity. Neuro:  Nonfocal  Psych: Normal affect   Labs    Chemistry Recent Labs Lab  02/24/17 1502  02/28/17 0305 02/28/17 1641 03/01/17 0335  NA 140  < > 137 136 136  K 3.9  < > 3.5 4.2 3.8  CL 106  < > 98* 100* 100*  CO2 25  < > 27 24 26   GLUCOSE 100*  < > 137* 116* 115*  BUN 20  < > 22* 23* 22*  CREATININE 1.27*  < > 1.37* 1.30* 1.23  CALCIUM 9.1  < > 9.4 9.5 9.4  PROT 6.6  --   --   --   --   ALBUMIN 3.3*  --   --   --   --   AST 32  --   --   --   --   ALT 30  --   --   --   --   ALKPHOS 66  --   --   --   --  BILITOT 4.3*  --   --   --   --   GFRNONAA >60  < > >60 >60 >60  GFRAA >60  < > >60 >60 >60  ANIONGAP 9  < > 12 12 10   < > = values in this interval not displayed.   Hematology  Recent Labs Lab 02/24/17 1502  WBC 7.7  RBC 5.09  HGB 13.5  HCT 42.2  MCV 82.9  MCH 26.5  MCHC 32.0  RDW 15.4  PLT 215    Cardiac Enzymes  Recent Labs Lab 02/24/17 1502  TROPONINI 0.04*   No results for input(s): TROPIPOC in the last 168 hours.   BNP  Recent Labs Lab 02/24/17 1508  BNP 361.6*     DDimer No results for input(s): DDIMER in the last 168 hours.   Radiology    No results found.  Cardiac Studies   Cath:  Conclusion   Findings:  AO = 109/86 (97) LV =  101/28 RA =  29 RV = 50/22 PA = 55/24 (38) PCW = 26 Fick cardiac output/index = 6.4/2.6 Thermal CO/CI = 4.9/2.0 PVR = 2.5 WU FA sat = 98% PA sat = 69%, 71% SVC sat = 82%, 82% IVC sat = 66%  Assessment: 1. Normal coronary arteries  2. Mild to moderate pulmonary HTN with normal PVR 3. Biventricular volume overload with R > L heart failure 4. Moderately decreased CO by thermodilution  5. 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 No intra-cardiac shunt     Patient Profile     36 y.o. male with new dx of combined systolic / diastolic CHF  Echo from4/11/18 LVEF 40 to 45%.  Admitted with anasarca    Assessment & Plan    1  Acute on chronic systolic/diastolic CHF I have reviewed echo   Atria enlarged  RV enlarged LVEF is depressed, prob greater than 35 to 40% Cath as noted above  WIll continue diuresis with IV lasix  CHF team will continue to follow      Signed, Dietrich Pates, MD  03/01/2017, 8:39 AM

## 2017-03-01 NOTE — H&P (View-Only) (Signed)
Progress Note  Patient Name: Bradley Powell Date of Encounter: 02/28/2017  Primary Cardiologist: Skains    Subjective   Breathing OK  No CP    Inpatient Medications    Scheduled Meds: . aspirin EC  81 mg Oral Daily  . furosemide  80 mg Intravenous TID  . heparin  5,000 Units Subcutaneous Q8H  . lisinopril  5 mg Oral Daily  . metoprolol tartrate  25 mg Oral BID  . potassium chloride  20 mEq Oral BID  . sodium chloride flush  3 mL Intravenous Q12H   Continuous Infusions:  PRN Meds: sodium chloride, acetaminophen, ALPRAZolam, ondansetron (ZOFRAN) IV, sodium chloride flush, zolpidem   Vital Signs    Vitals:   02/27/17 0925 02/27/17 1351 02/27/17 1957 02/28/17 0502  BP: 108/72 101/69 103/71 98/63  Pulse: (!) 105 (!) 104 (!) 102 93  Resp: 16 18 18 18   Temp: 98.1 F (36.7 C) 97.5 F (36.4 C) 98.1 F (36.7 C) 97.5 F (36.4 C)  TempSrc: Oral Oral Oral Oral  SpO2: 97% 98% 98% 97%  Weight:    (!) 306 lb 12.8 oz (139.2 kg)  Height:        Intake/Output Summary (Last 24 hours) at 02/28/17 0718 Last data filed at 02/28/17 0240  Gross per 24 hour  Intake              723 ml  Output             3625 ml  Net           Roll[place] 6Vernard GShrineRollKentuckyand 6RollKentucVernKentuckyRReita ChardlKenVernard GId[facility] State Hospital NorJannifer FranklKatrinka BlaGi Or TTAG>eased   Cardiac: RRR, no murmurs, rubs, or gallops.  Respiratory: Clear to auscultation bilaterally. GI: Soft, nontender, non-distended  GU  Scrotal edema   MS:  Tr to 1+ edema; No deformity. Neuro:  Nonfocal  Psych: Normal affect   Labs    Chemistry Recent Labs Lab 02/24/17 1502  02/26/17 0359 02/27/17 0305 02/28/17 0305  NA 140  < > 139 139 137    K 3.9  < > 3.8 3.8 3.5  CL 106  < > 104 97* 98*  CO2 25  < > 24 30 27   GLUCOSE 100*  < > 102* 120* 137*  BUN 20  < > 20 20 22*  CREATININE 1.27*  < > 1.26* 1.34* 1.37*  CALCIUM 9.1  < > 9.2 9.3 9.4  PROT 6.6  --   --   --   --   ALBUMIN 3.3*  --   --   --   --   AST 32  --   --   --   --   ALT 30  --   --   --   --   ALKPHOS 66  --   --   --   --   BILITOT 4.3*  --   --   --   --   GFRNONAA >60  < > >  60 >60 >60  GFRAA >60  < > >60 >60 >60  ANIONGAP 9  < > 11 12 12   < > = values in this interval not displayed.   Hematology  Recent Labs Lab 02/24/17 1502  WBC 7.7  RBC 5.09  HGB 13.5  HCT 42.2  MCV 82.9  MCH 26.5  MCHC 32.0  RDW 15.4  PLT 215    Cardiac Enzymes  Recent Labs Lab 02/24/17 1502  TROPONINI 0.04*   No results for input(s): TROPIPOC in the last 168 hours.   BNP  Recent Labs Lab 02/24/17 1508  BNP 361.6*     DDimer No results for input(s): DDIMER in the last 168 hours.   Radiology    No results found.  Cardiac Studies    Patient Profile     36 y.o. male with new dx of combined systolic / diastolic CHF  Echo from4/11/18 LVEF 40 to 45%.  Admitted with anasarca    Assessment & Plan    1  Acute on chronic systolic/diastolic CHF   Has continued to diurese but cr has bumped  Will hold IV lasix  Hold back on ACEI for now with Cr bump I have reviewed echo  Atria enlarged  RV enlarged LVEF is depressed, prob greater than 35 to 40% I have reviewed with D Bensimhon  Will plan on R and L heart catheterization tomorrow to define pressures/anatomy  2  Tachycardia  ST   HR better but still mildly elevated    Signed, Dietrich Pates, MD  02/28/2017, 7:18 AM

## 2017-03-01 NOTE — Progress Notes (Signed)
Patient rested well overnight. NPO overnight for cath this am. Consent signed. Prep completed. Patient watched cath video. States he has no questions. Patient in bed resting.

## 2017-03-01 NOTE — Progress Notes (Signed)
Heart Failure Navigator Consult Note  Presentation: Per [name] NP [name]  is seen today for evaluation of heart failure at the request of Dr Tenny Craw.   Referred to Dr Anne Fu earlier this month for newly reduced EF and saw him April 13th. Weight up 90 pounds over the last year. Due to massive anasarca he was sent to [facility] for IV diuresis.    Mr Girgis is a 36 year old with history of obesity, 1st degree heart block, newly diagnosed with systolic heart failure admitted with increased dyspnea and lower extremity edema. Pertinent admission labs include K 3.9, Creatinine 1.27, Hgb 13.5, BNP 340.8, Troponin 0.04. CXR on admit ok. He has been diuresing with IV lasix. Weight down 19 pounds. EKG on 4/15  showed Sinus Tach with 1st degree heart block.   Started on low dose bb. Today he underwent RHCLHC  to further evaluated.   Past Medical History:  Diagnosis Date  . History of esophagogastroduodenoscopy (EGD)     Social History   Social History  . Marital status: Single    Spouse name: N/A  . Number of children: N/A  . Years of education: N/A   Social History Main Topics  . Smoking status: Never Smoker  . Smokeless tobacco: Never Used  . Alcohol use Yes     Comment: rarely  . Drug use: No  . Sexual activity: Not Asked   Other Topics Concern  . None   Social History Narrative  . None    ECHO:Study Conclusions- 02/22/17  - Left ventricle: The cavity size was normal. Systolic function was   mildly to moderately reduced. The estimated ejection fraction was   in the range of 40% to 45%. Wall motion was normal; there were no   regional wall motion abnormalities. Features are consistent with   a pseudonormal left ventricular filling pattern, with concomitant   abnormal relaxation and increased filling pressure (grade 2   diastolic dysfunction). - Mitral valve: There was mild to moderate regurgitation. - Left atrium: The atrium was mildly dilated. Anterior-posterior  dimension: 46 mm. - Pulmonary arteries: Systolic pressure was mildly increased. - Pericardium, extracardiac: A trivial pericardial effusion was   identified posterior to the heart.  ------------------------------------------------------------------- Study data:  No prior study was available for comparison.  Study status:  Routine.  Procedure:  Transthoracic echocardiography. Image quality was adequate.          Transthoracic echocardiography.  M-mode, complete 2D, spectral Doppler, and color Doppler.  Birthdate:  Patient birthdate: August 23, 1981.  Age:  Patient is 36 yr old.  Sex:  Gender: male.    BMI: 114 kg/m^2.  Patient status:  Outpatient.  Study date:  Study date: 02/22/2017. Study time: 09:23 AM.  Location:  [facility]  Cardiac catheterization: 03/01/17  RHC/LHC 03/01/2017 AO = 109/86 (97) LV = 101/28 RA = 29 RV = 50/22 PA = 55/24 (38) PCW = 26 Fick cardiac output/index = 6.4/2.6 Thermal CO/CI = 4.9/2.0 PVR = 2.5 WU FA sat = 98% PA sat = 69%, 71% SVC sat = 82%, 82% IVC sat = 66%  Assessment: 1. Normal coronary arteries  2. Mild to moderate pulmonary HTN with normal PVR 3. Biventricular volume overload with R > L heart failure 4. Moderately decreased CO by thermodilution  5. 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 No intra-cardiac shunt 02/2017 ECHO LV EF 40-45%. Grade II DD.  BNP    Component Value Date/Time   BNP 361.6 (H) 02/24/2017 1508    ProBNP No results found for: PROBNP   Education Assessment and Provision:  Detailed education and instructions provided on heart failure disease management including the following:  Signs and symptoms of Heart Failure When to call the physician Importance of daily weights Low sodium diet Fluid restriction Medication management Anticipated future follow-up appointments  Patient education given on each of the above topics.   Patient acknowledges understanding and acceptance of all instructions.  I spoke briefly with Mr. Rossell as his nurse awakened him.  He remains quite sleepy and I will plan to return to complete education.  I explained the importance of daily weights and when to contact the physician.  He tells me that he has a scale at home.  I also discussed the need to limit sodium and avoid high sodium foods.  He lives with his mother and father in [place].  He has no insurance noted and likely will need assistance with any medications prescribed at discharge.  He will follow-up with the AHF clinic.  Education Materials:  "Living Better With Heart Failure" Booklet, Daily Weight Tracker Tool.   High Risk Criteria for Readmission and/or Poor Patient Outcomes:  (Recommend Follow-up with Advanced Heart Failure Clinic)--yes   EF <30%- No 40-45% New HF  2 or more admissions in 6 months- No  Difficult social situation- -no insurance noted  Demonstrates medication noncompliance- Denies   Barriers of Care:  New HF, Knowledge, financial and compliance  Discharge Planning:   Plans to return to home with parents in [place].  He could benefit from HF [employer] for ongoing HF education and compliance reinforcement.

## 2017-03-02 DIAGNOSIS — N5089 Other specified disorders of the male genital organs: Secondary | ICD-10-CM

## 2017-03-02 LAB — BASIC METABOLIC PANEL
Anion gap: 9 (ref 5–15)
BUN: 15 mg/dL (ref 6–20)
CHLORIDE: 98 mmol/L — AB (ref 101–111)
CO2: 30 mmol/L (ref 22–32)
CREATININE: 1.32 mg/dL — AB (ref 0.61–1.24)
Calcium: 9.4 mg/dL (ref 8.9–10.3)
GFR calc non Af Amer: >60 mL/min (ref >60)
GLUCOSE: 155 mg/dL — AB (ref 65–99)
Potassium: 4 mmol/L (ref 3.5–5.1)
Sodium: 137 mmol/L (ref 135–145)

## 2017-03-02 LAB — TSH: TSH: 4.477 u[IU]/mL (ref 0.350–4.500)

## 2017-03-02 MED ORDER — METOLAZONE 2.5 MG PO TABS
2.5000 mg | ORAL_TABLET | Freq: Once | ORAL | Status: AC
  Administered 2017-03-02: 2.5 mg via ORAL
  Filled 2017-03-02: qty 1

## 2017-03-02 MED ORDER — SPIRONOLACTONE 25 MG PO TABS
25.0000 mg | ORAL_TABLET | Freq: Every day | ORAL | Status: DC
  Administered 2017-03-03 – 2017-03-07 (×5): 25 mg via ORAL
  Filled 2017-03-02 (×5): qty 1

## 2017-03-02 NOTE — Progress Notes (Signed)
Patient rested well overnight. No complaints of pain. Site remains at a level 0. Dressing in place. Pulse 2+ on right extremity. Extremity warm to touch. Color normal. No complaints at this time.

## 2017-03-02 NOTE — Progress Notes (Signed)
Advanced Heart Failure Rounding Note   Subjective:     Yesterday had cath with biventricular volume overload. Diuresing with 80 mg IV lasix three times a day . Negative 3.2 liters. Weight down 3 pounds.   Complaining of scrotal pain from edema. Denies SOB.   RHC/LHC 03/01/2017 AO = 109/86 (97) LV = 101/28 RA = 29 RV = 50/22 PA = 55/24 (38) PCW = 26 Fick cardiac output/index = 6.4/2.6 Thermal CO/CI = 4.9/2.0 PVR = 2.5 WU FA sat = 98% PA sat = 69%, 71% SVC sat = 82%, 82% IVC sat = 66%  Assessment: 1. Normal coronary arteries  2. Mild to moderate pulmonary HTN with normal PVR 3. Biventricular volume overload with R > L heart failure 4. Moderately decreased CO by thermodilution  5. 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 No intra-cardiac shunt  02/2017 ECHO LV EF 40-45%. Moderate RV HK  Grade II DD.   Objective:   Weight Range:  Vital Signs:   Temp:  [98.2 F (36.8 C)] 98.2 F (36.8 C) (04/19 0718) Pulse Rate:  [66-109] 109 (04/19 0718) Resp:  [0-53] 18 (04/19 0718) BP: (100-134)/(62-93) 132/69 (04/19 0718) SpO2:  [95 %-100 %] 100 % (04/19 0718) Weight:  [304 lb 14.4 oz (138.3 kg)] 304 lb 14.4 oz (138.3 kg) (04/19 0718) Last BM Date: 02/28/17  Weight change: Filed Weights   02/28/17 0502 03/01/17 0532 03/02/17 0718  Weight: (!) 306 lb 12.8 oz (139.2 kg) (!) 307 lb 3.2 oz (139.3 kg) (!) 304 lb 14.4 oz (138.3 kg)    Intake/Output:   Intake/Output Summary (Last 24 hours) at 03/02/17 0906 Last data filed at 03/02/17 0718  Gross per 24 hour  Intake              240 ml  Output             4250 ml  Net            -4010 ml     Physical Exam: General:  Obese male. No resp difficulty. Sitting in the chair HEENT: normal Neck: supple. JVP difficult due to body habitus. Carotids 2+ bilat; no bruits. No lymphadenopathy or thryomegaly appreciated. Cor: PMI nondisplaced. Tachy  Regular rate & rhythm. No rubs, gallops or murmurs. Lungs: clear Abdomen: obese soft, nontender, + distended. No hepatosplenomegaly. No bruits or masses. Good bowel sounds. Extremities: no cyanosis, clubbing, rash, R and LLE 1+ edema Neuro: alert & orientedx3, cranial nerves grossly intact. moves all 4 extremities w/o difficulty. Affect pleasant GU: Massive Scrotal edema   Telemetry: Sinus Tach 110s. Personally reviewed.   Labs: Basic Metabolic Panel:  Recent Labs Lab 02/24/17 1502  02/26/17 0359 02/27/17 0305 02/28/17 0305 02/28/17 1641 03/01/17 0335  NA 140  < > 139 139 137 136 136  K 3.9  < > 3.8 3.8 3.5 4.2 3.8  CL 106  < > 104 97* 98* 100* 100*  CO2 25  < > 24 30 27 24 26   GLUCOSE 100*  < > 102* 120* 137* 116* 115*  BUN 20  < > 20 20 22* 23* 22*  CREATININE 1.27*  < > 1.26* 1.34* 1.37* 1.30* 1.23  CALCIUM 9.1  < > 9.2 9.3 9.4 9.5 9.4  MG 2.0  --   --   --   --   --   --   < > = values in this interval  not displayed.  Liver Function Tests:  Recent Labs Lab 02/24/17 1502  AST 32  ALT 30  ALKPHOS 66  BILITOT 4.3*  PROT 6.6  ALBUMIN 3.3*   No results for input(s): LIPASE, AMYLASE in the last 168 hours. No results for input(s): AMMONIA in the last 168 hours.  CBC:  Recent Labs Lab 02/24/17 1502  WBC 7.7  NEUTROABS 5.2  HGB 13.5  HCT 42.2  MCV 82.9  PLT 215    Cardiac Enzymes:  Recent Labs Lab 02/24/17 1502  TROPONINI 0.04*    BNP: BNP (last 3 results)  Recent Labs  02/19/17 1834 02/24/17 1508  BNP 340.8* 361.6*    ProBNP (last 3 results) No results for input(s): PROBNP in the last 8760 hours.    Other results:  Imaging:  No results found.   Medications:     Scheduled Medications: . aspirin EC  81 mg Oral Daily  . furosemide  80 mg Intravenous Q8H  . heparin  5,000 Units Subcutaneous Q8H  . potassium chloride  20 mEq Oral BID  . sodium chloride flush  3 mL Intravenous Q12H  . sodium chloride flush  3 mL Intravenous Q12H    . spironolactone  12.5 mg Oral Daily     Infusions: . sodium chloride    . sodium chloride       PRN Medications:  sodium chloride, sodium chloride, acetaminophen, ALPRAZolam, ondansetron (ZOFRAN) IV, sodium chloride flush, sodium chloride flush, zolpidem    Assessment/Plan/Discussion:    1. A/C Combined Systolic/Diastolic Heart Failure. R>L. EF ~40%.NICM likely with sleep apnea component.  RHC/LHC normal cors. Elevated biventricular pressures.  Difficulty to know how much fluid we can diurese due to body habitus. Massive scrotal edema. Will need push diuresis until creatinine bumps.  Volume status elevated. Continue 80 mg IV lasix three times a day. Increase spiro to 25 mg daily. Metolazone 2.5 mg x1.  Renal function stable.  2. Sinus Tach 1st degree heart block- BB held. Anticipate restarting low dose once diuresed. TSH 4.5 3. Morbid Obesity 4. Suspected Sleep Apnea- needs outpatient sleep study  Length of Stay: 6   Amy Clegg NP-C  03/02/2017, 9:06 AM  Advanced Heart Failure Team Pager [phone] (M-F; 7a - 4p)  Please contact [facility] for night-coverage after hours (4p -7a ) and weekends on [email or web address]  Patient seen and examined with [name], NP. We discussed all aspects of the encounter. I agree with the assessment and plan as stated above.   Echo images reviewed personally. Cath results discussed with him.   He has massive volume overload with R>>L heart failure. Normal coronaries on cath. Will need aggressive diuresis with IV lasix and metolazone. Watch creatinine closely. If does not diurese well will add milrinone.   Likely has OSA//OHS discussed need for sleeps study and weight loss.   Arvilla Meres, MD  10:33 PM

## 2017-03-03 LAB — ANTINUCLEAR ANTIBODIES, IFA: ANA Ab, IFA: NEGATIVE

## 2017-03-03 LAB — PROTEIN, URINE, 24 HOUR
Collection Interval-UPROT: 24 hours
Protein, Urine: <6 mg/dL
Urine Total Volume-UPROT: 6600 mL

## 2017-03-03 LAB — CREATININE CLEARANCE, URINE, 24 HOUR
COLLECTION INTERVAL-CRCL: 24 h
CREAT CLEAR: 161 mL/min — AB (ref 75–125)
CREATININE, URINE: 41.71 mg/dL
Creatinine, 24H Ur: 2753 mg/d — ABNORMAL HIGH (ref 800–2000)
Urine Total Volume-CRCL: 6600 mL

## 2017-03-03 LAB — BASIC METABOLIC PANEL
Anion gap: 15 (ref 5–15)
BUN: 17 mg/dL (ref 6–20)
CO2: 27 mmol/L (ref 22–32)
CREATININE: 1.19 mg/dL (ref 0.61–1.24)
Calcium: 10.1 mg/dL (ref 8.9–10.3)
Chloride: 94 mmol/L — ABNORMAL LOW (ref 101–111)
GFR calc Af Amer: >60 mL/min (ref >60)
Glucose, Bld: 116 mg/dL — ABNORMAL HIGH (ref 65–99)
Potassium: 3.5 mmol/L (ref 3.5–5.1)
SODIUM: 136 mmol/L (ref 135–145)

## 2017-03-03 MED ORDER — METOLAZONE 2.5 MG PO TABS
2.5000 mg | ORAL_TABLET | Freq: Every day | ORAL | Status: DC
  Administered 2017-03-03 – 2017-03-05 (×3): 2.5 mg via ORAL
  Filled 2017-03-03 (×3): qty 1

## 2017-03-03 NOTE — Progress Notes (Signed)
Advanced Heart Failure Rounding Note   Subjective:    Yesterday metolazone added. Brisk diuresis noted. Weight trending down 7 pounds.   Feeling a little better. Denies SOB.   RHC/LHC 03/01/2017 AO = 109/86 (97) LV = 101/28 RA = 29 RV = 50/22 PA = 55/24 (38) PCW = 26 Fick cardiac output/index = 6.4/2.6 Thermal CO/CI = 4.9/2.0 PVR = 2.5 WU FA sat = 98% PA sat = 69%, 71% SVC sat = 82%, 82% IVC sat = 66%  Assessment: 1. Normal coronary arteries  2. Mild to moderate pulmonary HTN with normal PVR 3. Biventricular volume overload with R > L heart failure 4. Moderately decreased CO by thermodilution  5. 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 No intra-cardiac shunt  02/2017 ECHO LV EF 40-45%. Moderate RV HK  Grade II DD.   Objective:   Weight Range:  Vital Signs:   Temp:  [97.4 F (36.3 C)-98.3 F (36.8 C)] 98.3 F (36.8 C) (04/20 0643) Pulse Rate:  [96-108] 96 (04/20 0643) Resp:  [18] 18 (04/20 0643) BP: (108-124)/(69-74) 124/74 (04/20 0643) SpO2:  [95 %-100 %] 95 % (04/20 0643) Weight:  [297 lb 9.6 oz (135 kg)] 297 lb 9.6 oz (135 kg) (04/20 0643) Last BM Date: 02/28/17  Weight change: Filed Weights   03/01/17 0532 03/02/17 0718 03/03/17 0643  Weight: (!) 307 lb 3.2 oz (139.3 kg) (!) 304 lb 14.4 oz (138.3 kg) 297 lb 9.6 oz (135 kg)    Intake/Output:   Intake/Output Summary (Last 24 hours) at 03/03/17 0737 Last data filed at 03/03/17 0715  Gross per 24 hour  Intake              700 ml  Output             4025 ml  Net            -3325 ml     Physical Exam: General:  Sitting in chair.  No resp difficulty HEENT: normal Neck: supple. jard to see JVD. Carotids 2+ bilat; no bruits. No lymphadenopathy or thryomegaly appreciated. Cor: PMI nonpalpable Regular rate & rhythm. No rubs, gallops or murmurs. Lungs: clear Abdomen: soft, nontender, nondistended. No  hepatosplenomegaly. No bruits or masses. Good bowel sounds. Extremities: no cyanosis, clubbing, rash, trace edema Neuro: alert & orientedx3, cranial nerves grossly intact. moves all 4 extremities w/o difficulty. Affect pleasant GU: massive scrotal edema   Telemetry: NSR/Sinus Tach  Personally reviewed. Personally reviewed  Labs: Basic Metabolic Panel:  Recent Labs Lab 02/24/17 1502  02/28/17 0305 02/28/17 1641 03/01/17 0335 03/02/17 1057 03/03/17 0343  NA 140  < > 137 136 136 137 136  K 3.9  < > 3.5 4.2 3.8 4.0 3.5  CL 106  < > 98* 100* 100* 98* 94*  CO2 25  < > 27 24 26 30 27   GLUCOSE 100*  < > 137* 116* 115* 155* 116*  BUN 20  < > 22* 23* 22* 15 17  CREATININE 1.27*  < > 1.37* 1.30* 1.23 1.32* 1.19  CALCIUM 9.1  < > 9.4 9.5 9.4 9.4 10.1  MG 2.0  --   --   --   --   --   --   < > = values in this interval not displayed.  Liver Function Tests:  Recent Labs Lab 02/24/17 1502  AST 32  ALT 30  ALKPHOS 66  BILITOT 4.3*  PROT 6.6  ALBUMIN 3.3*   No results for input(s): LIPASE, AMYLASE in the last 168 hours. No results for input(s): AMMONIA in the last 168 hours.  CBC:  Recent Labs Lab 02/24/17 1502  WBC 7.7  NEUTROABS 5.2  HGB 13.5  HCT 42.2  MCV 82.9  PLT 215    Cardiac Enzymes:  Recent Labs Lab 02/24/17 1502  TROPONINI 0.04*    BNP: BNP (last 3 results)  Recent Labs  02/19/17 1834 02/24/17 1508  BNP 340.8* 361.6*    ProBNP (last 3 results) No results for input(s): PROBNP in the last 8760 hours.    Other results:  Imaging: No results found.   Medications:     Scheduled Medications: . aspirin EC  81 mg Oral Daily  . furosemide  80 mg Intravenous Q8H  . heparin  5,000 Units Subcutaneous Q8H  . potassium chloride  20 mEq Oral BID  . sodium chloride flush  3 mL Intravenous Q12H  . sodium chloride flush  3 mL Intravenous Q12H  . spironolactone  25 mg Oral Daily    Infusions: . sodium chloride    . sodium chloride      PRN  Medications: sodium chloride, sodium chloride, acetaminophen, ALPRAZolam, ondansetron (ZOFRAN) IV, sodium chloride flush, sodium chloride flush, zolpidem    Assessment/Plan/Discussion:    1. A/C Combined Systolic/Diastolic Heart Failure. R>L. EF ~40%.NICM likely with sleep apnea component.  RHC/LHC normal cors. Elevated biventricular pressures.  Difficulty to know how much fluid we can diurese due to body habitus. Massive scrotal edema. Will need push diuresis until creatinine bumps.  Volume status improving. Continue 80 mg IV lasix three times a day and continue 2.5 mg metolazone daily.   Continue spiro 25 mg daily. Renal function stable.BMET in am.  2. Sinus Tach 1st degree heart block- Hold bb for now. May restarted later. Anticipate restarting low dose once diuresed. TSH 4.5 3. Morbid Obesity 4. Suspected Sleep Apnea- needs outpatient sleep study  Length of Stay: 7  Amy Clegg NP-C  03/03/2017, 7:37 AM  Advanced Heart Failure Team Pager [phone] (M-F; 7a - 4p)  Please contact [facility] for night-coverage after hours (4p -7a ) and weekends on [email or web address]  Patient seen and examined with [name], NP. We discussed all aspects of the encounter. I agree with the assessment and plan as stated above.   He has right greater than HF. Still with significant scrotal edema but is improving. Renal function improved. K low will supp.   24 hour preliminary does not show excessive proteinuria.   Continue IV lasix.   Arvilla Meres, MD  4:18 PM

## 2017-03-04 DIAGNOSIS — E876 Hypokalemia: Secondary | ICD-10-CM

## 2017-03-04 LAB — BASIC METABOLIC PANEL
Anion gap: 14 (ref 5–15)
BUN: 23 mg/dL — AB (ref 6–20)
CO2: 28 mmol/L (ref 22–32)
CREATININE: 1.2 mg/dL (ref 0.61–1.24)
Calcium: 10 mg/dL (ref 8.9–10.3)
Chloride: 93 mmol/L — ABNORMAL LOW (ref 101–111)
GFR calc Af Amer: >60 mL/min (ref >60)
Glucose, Bld: 99 mg/dL (ref 65–99)
POTASSIUM: 3.6 mmol/L (ref 3.5–5.1)
SODIUM: 135 mmol/L (ref 135–145)

## 2017-03-04 MED ORDER — POTASSIUM CHLORIDE CRYS ER 20 MEQ PO TBCR
40.0000 meq | EXTENDED_RELEASE_TABLET | Freq: Two times a day (BID) | ORAL | Status: DC
  Administered 2017-03-04 – 2017-03-05 (×3): 40 meq via ORAL
  Filled 2017-03-04 (×3): qty 2

## 2017-03-04 NOTE — Progress Notes (Signed)
Patient complaining of bilateral leg cramping. MD notified.

## 2017-03-04 NOTE — Progress Notes (Signed)
MD returned the page. Ordered to give Potassium 40 mg earlier than scheduled.

## 2017-03-04 NOTE — Progress Notes (Signed)
Advanced Heart Failure Rounding Note   Subjective:    Continues to diurese on IV lasix. Weight down 40 pounds. Still with scrotal edema. Denies SOB or orthopnea. + cramps    RHC/LHC 03/01/2017 AO = 109/86 (97) LV = 101/28 RA = 29 RV = 50/22 PA = 55/24 (38) PCW = 26 Fick cardiac output/index = 6.4/2.6 Thermal CO/CI = 4.9/2.0 PVR = 2.5 WU FA sat = 98% PA sat = 69%, 71% SVC sat = 82%, 82% IVC sat = 66%  Assessment: 1. Normal coronary arteries  2. Mild to moderate pulmonary HTN with normal PVR 3. Biventricular volume overload with R > L heart failure 4. Moderately decreased CO by thermodilution  5. 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 No intra-cardiac shunt  02/2017 ECHO LV EF 40-45%. Moderate RV HK  Grade II DD.   Objective:   Weight Range:  Vital Signs:   Temp:  [97.7 F (36.5 C)-98 F (36.7 C)] 98 F (36.7 C) (04/21 0100) Pulse Rate:  [105-108] 108 (04/21 0100) Resp:  [20] 20 (04/21 0100) BP: (103-113)/(61-74) 103/61 (04/21 0100) SpO2:  [95 %-96 %] 95 % (04/21 0100) Weight:  [130 kg (286 lb 9.6 oz)] 130 kg (286 lb 9.6 oz) (04/21 0328) Last BM Date: 02/27/17  Weight change: Filed Weights   03/02/17 0718 03/03/17 0643 03/04/17 0328  Weight: (!) 138.3 kg (304 lb 14.4 oz) 135 kg (297 lb 9.6 oz) 130 kg (286 lb 9.6 oz)    Intake/Output:   Intake/Output Summary (Last 24 hours) at 03/04/17 1027 Last data filed at 03/04/17 0958  Gross per 24 hour  Intake              360 ml  Output             5820 ml  Net            -5460 ml     Physical Exam: General:  Lying flat in bed No resp difficulty HEENT: normal Neck: supple. Hard to see JVP due to habitus. Looks up . Carotids 2+ bilat; no bruits. No lymphadenopathy or thryomegaly appreciated. Cor: PMI nonpalpable Tachy. Regular . No rubs, gallops or murmurs. Lungs: clear Abdomen: soft, nontender, nondistended. No  hepatosplenomegaly. No bruits or masses. Good bowel sounds. Extremities: no cyanosis, clubbing, rash, 1-2+ edema + marked scrotal edema  Neuro: alert & orientedx3, cranial nerves grossly intact. moves all 4 extremities w/o difficulty. Affect pleasant   Telemetry: Sinus tach 100-110. Personally reviewed   Labs: Basic Metabolic Panel:  Recent Labs Lab 02/28/17 1641 03/01/17 0335 03/02/17 1057 03/03/17 0343 03/04/17 0605  NA 136 136 137 136 135  K 4.2 3.8 4.0 3.5 3.6  CL 100* 100* 98* 94* 93*  CO2 24 26 30 27 28   GLUCOSE 116* 115* 155* 116* 99  BUN 23* 22* 15 17 23*  CREATININE 1.30* 1.23 1.32* 1.19 1.20  CALCIUM 9.5 9.4 9.4 10.1 10.0    Liver Function Tests: No results for input(s): AST, ALT, ALKPHOS, BILITOT, PROT, ALBUMIN in the last 168 hours. No results for input(s): LIPASE, AMYLASE in the last 168 hours. No results for input(s): AMMONIA in the last 168 hours.  CBC: No results for input(s): WBC, NEUTROABS, HGB, HCT, MCV, PLT in the last 168 hours.  Cardiac Enzymes: No results for input(s): CKTOTAL, CKMB, CKMBINDEX, TROPONINI in the last 168 hours.  BNP: BNP (last 3 results)  Recent Labs  02/19/17 1834 02/24/17 1508  BNP 340.8* 361.6*    ProBNP (last 3 results) No results for input(s): PROBNP in the last 8760 hours.    Other results:  Imaging: No results found.   Medications:     Scheduled Medications: . aspirin EC  81 mg Oral Daily  . furosemide  80 mg Intravenous Q8H  . heparin  5,000 Units Subcutaneous Q8H  . metolazone  2.5 mg Oral Daily  . potassium chloride  20 mEq Oral BID  . sodium chloride flush  3 mL Intravenous Q12H  . sodium chloride flush  3 mL Intravenous Q12H  . spironolactone  25 mg Oral Daily    Infusions: . sodium chloride    . sodium chloride      PRN Medications: sodium chloride, sodium chloride, acetaminophen, ALPRAZolam, ondansetron (ZOFRAN) IV, sodium chloride flush, sodium chloride flush, zolpidem     Assessment/Plan/Discussion:    1. A/C Combined Systolic/Diastolic Heart Failure.  -- R>L. EF ~40%.NICM likely with sleep apnea component.  RHC/LHC normal cors. Elevated biventricular pressures.  --He has R>>L heart failure symptoms. Now down 40 pounds but still overloaded. Continue IV lasix and metolazone.  --Continue spiro 25 mg daily.  --Renal function stable.  --Supp KCL 2. Sinus Tach 1st degree heart block-  -- Holding bb with HF decompensation.   3. Morbid Obesity 4. Suspected Sleep Apnea- needs outpatient sleep study 5. Hypokalemia - Will supp. Check mag in am.   Length of Stay: 8  Arvilla Meres MD 03/04/2017, 10:27 AM  Advanced Heart Failure Team Pager [phone] (M-F; 7a - 4p)  Please contact [facility] for night-coverage after hours (4p -7a ) and weekends on [email or web address]

## 2017-03-05 DIAGNOSIS — I10 Essential (primary) hypertension: Secondary | ICD-10-CM

## 2017-03-05 LAB — BASIC METABOLIC PANEL
ANION GAP: 14 (ref 5–15)
BUN: 28 mg/dL — ABNORMAL HIGH (ref 6–20)
CALCIUM: 10.3 mg/dL (ref 8.9–10.3)
CHLORIDE: 92 mmol/L — AB (ref 101–111)
CO2: 30 mmol/L (ref 22–32)
CREATININE: 1.38 mg/dL — AB (ref 0.61–1.24)
GFR calc non Af Amer: >60 mL/min (ref >60)
Glucose, Bld: 145 mg/dL — ABNORMAL HIGH (ref 65–99)
Potassium: 3.6 mmol/L (ref 3.5–5.1)
SODIUM: 136 mmol/L (ref 135–145)

## 2017-03-05 LAB — MAGNESIUM: MAGNESIUM: 2.2 mg/dL (ref 1.7–2.4)

## 2017-03-05 MED ORDER — POTASSIUM CHLORIDE CRYS ER 20 MEQ PO TBCR
40.0000 meq | EXTENDED_RELEASE_TABLET | Freq: Every day | ORAL | Status: DC
  Administered 2017-03-06: 40 meq via ORAL
  Filled 2017-03-05: qty 2

## 2017-03-05 MED ORDER — HYDROCODONE-ACETAMINOPHEN 5-325 MG PO TABS
1.0000 | ORAL_TABLET | Freq: Once | ORAL | Status: AC
  Administered 2017-03-05: 1 via ORAL
  Filled 2017-03-05: qty 1

## 2017-03-05 MED ORDER — TORSEMIDE 20 MG PO TABS
40.0000 mg | ORAL_TABLET | Freq: Every day | ORAL | Status: DC
  Administered 2017-03-06: 40 mg via ORAL
  Filled 2017-03-05: qty 2

## 2017-03-05 NOTE — Plan of Care (Signed)
Problem: Activity: Goal: Capacity to carry out activities will improve Outcome: Progressing Patient ambulates in room independently. No complaints of SOB or pain with ambulation.

## 2017-03-05 NOTE — Progress Notes (Signed)
Cardiology Cross Cover Note  Paged by nursing staff regarding cramping/pain in patients leg and foot.  Currently being diuresed for HF and BMP and Mg ordered for this AM and are pending.  Tylenol was ineffective.  BP and HR stable.  Will give one time dose of 5/325 Norco.

## 2017-03-05 NOTE — Progress Notes (Signed)
Patient complaining of pain in his feet and legs. MD paged and made aware. Awaiting new orders.

## 2017-03-05 NOTE — Progress Notes (Signed)
Advanced Heart Failure Rounding Note   Subjective:    Continues to diurese on IV lasix. Weight down another 5 pounds. 45 pounds total. Scotal edema improving  Denies SOB or orthopnea. Having severe cramps. Creatinine 1.2-> 1.38 K 3.6   RHC/LHC 03/01/2017 AO = 109/86 (97) LV = 101/28 RA = 29 RV = 50/22 PA = 55/24 (38) PCW = 26 Fick cardiac output/index = 6.4/2.6 Thermal CO/CI = 4.9/2.0 PVR = 2.5 WU FA sat = 98% PA sat = 69%, 71% SVC sat = 82%, 82% IVC sat = 66%  Assessment: 1. Normal coronary arteries  2. Mild to moderate pulmonary HTN with normal PVR 3. Biventricular volume overload with R > L heart failure 4. Moderately decreased CO by thermodilution  5. 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 No intra-cardiac shunt  02/2017 ECHO LV EF 40-45%. Moderate RV HK  Grade II DD.   Objective:   Weight Range:  Vital Signs:   Temp:  [98 F (36.7 C)-98.4 F (36.9 C)] 98 F (36.7 C) (04/22 0619) Pulse Rate:  [107-116] 116 (04/22 0619) Resp:  [18-20] 20 (04/22 0619) BP: (98-139)/(69-72) 139/72 (04/22 0619) SpO2:  [96 %-97 %] 97 % (04/22 0619) Weight:  [127.6 kg (281 lb 3.2 oz)] 127.6 kg (281 lb 3.2 oz) (04/22 0619) Last BM Date: 02/27/17  Weight change: Filed Weights   03/03/17 0643 03/04/17 0328 03/05/17 0619  Weight: 135 kg (297 lb 9.6 oz) 130 kg (286 lb 9.6 oz) 127.6 kg (281 lb 3.2 oz)    Intake/Output:   Intake/Output Summary (Last 24 hours) at 03/05/17 1252 Last data filed at 03/05/17 1004  Gross per 24 hour  Intake              963 ml  Output             4525 ml  Net            -3562 ml     Physical Exam: General:  Sitting in chair  No resp difficulty General:  Well appearing. No resp difficulty HEENT: normal Neck: supple. no obvious JVD. Carotids 2+ bilat; no bruits. No lymphadenopathy or thryomegaly appreciated. Cor: PMI nondisplaced. Regular rate & rhythm. No  rubs, gallops or murmurs. Lungs: clear Abdomen: obese soft, nontender, nondistended. No hepatosplenomegaly. No bruits or masses. Good bowel sounds. Extremities: no cyanosis, clubbing, rash, edema  Scrotal edema much improved  Neuro: alert & orientedx3, cranial nerves grossly intact. moves all 4 extremities w/o difficulty. Affect pleasant    Telemetry: Sinus tach 105-115. Personally reviewed    Labs: Basic Metabolic Panel:  Recent Labs Lab 03/01/17 0335 03/02/17 1057 03/03/17 0343 03/04/17 0605 03/05/17 0433  NA 136 137 136 135 136  K 3.8 4.0 3.5 3.6 3.6  CL 100* 98* 94* 93* 92*  CO2 26 30 27 28 30   GLUCOSE 115* 155* 116* 99 145*  BUN 22* 15 17 23* 28*  CREATININE 1.23 1.32* 1.19 1.20 1.38*  CALCIUM 9.4 9.4 10.1 10.0 10.3  MG  --   --   --   --  2.2    Liver Function Tests: No results for input(s): AST, ALT, ALKPHOS, BILITOT, PROT, ALBUMIN in the last 168 hours. No results for input(s): LIPASE, AMYLASE in the last 168 hours. No results for input(s): AMMONIA in the last 168 hours.  CBC: No results for input(s): WBC, NEUTROABS, HGB, HCT, MCV, PLT in  the last 168 hours.  Cardiac Enzymes: No results for input(s): CKTOTAL, CKMB, CKMBINDEX, TROPONINI in the last 168 hours.  BNP: BNP (last 3 results)  Recent Labs  02/19/17 1834 02/24/17 1508  BNP 340.8* 361.6*    ProBNP (last 3 results) No results for input(s): PROBNP in the last 8760 hours.    Other results:  Imaging: No results found.   Medications:     Scheduled Medications: . aspirin EC  81 mg Oral Daily  . furosemide  80 mg Intravenous Q8H  . heparin  5,000 Units Subcutaneous Q8H  . metolazone  2.5 mg Oral Daily  . potassium chloride  40 mEq Oral BID  . sodium chloride flush  3 mL Intravenous Q12H  . sodium chloride flush  3 mL Intravenous Q12H  . spironolactone  25 mg Oral Daily    Infusions: . sodium chloride    . sodium chloride      PRN Medications: sodium chloride, sodium  chloride, acetaminophen, ALPRAZolam, ondansetron (ZOFRAN) IV, sodium chloride flush, sodium chloride flush, zolpidem    Assessment/Plan/Discussion:    1. A/C Combined Systolic/Diastolic Heart Failure.  -- R>L. EF ~40%.NICM likely with sleep apnea component.  RHC/LHC normal cors. Elevated biventricular pressures.  --He has R>>L heart failure symptoms. Now down 45 pounds. Nearing euvolemia. Creatinine starting to bump.  --Stop IV lasix. Switch to torsemide 40 po bid --Continue spiro 25 mg daily.  --Supp KCL 2. Sinus Tach 1st degree heart block-  -- Holding bb with HF decompensation.   3. Morbid Obesity 4. Suspected Sleep Apnea- needs outpatient sleep study 5. Hypokalemia - Will supp Mag ok at 2.2  6. HTN - Watch renal function closely. If OK wil start Entresto in am   Length of Stay: 9  Treazure Nery MD 03/05/2017, 12:52 PM  Advanced Heart Failure Team Pager [phone] (M-F; 7a - 4p)  Please contact [facility] for night-coverage after hours (4p -7a ) and weekends on [email or web address]

## 2017-03-06 DIAGNOSIS — M1000 Idiopathic gout, unspecified site: Secondary | ICD-10-CM

## 2017-03-06 LAB — BASIC METABOLIC PANEL
Anion gap: 12 (ref 5–15)
BUN: 28 mg/dL — AB (ref 6–20)
CO2: 27 mmol/L (ref 22–32)
CREATININE: 1.12 mg/dL (ref 0.61–1.24)
Calcium: 10 mg/dL (ref 8.9–10.3)
Chloride: 95 mmol/L — ABNORMAL LOW (ref 101–111)
GFR calc Af Amer: >60 mL/min (ref >60)
Glucose, Bld: 108 mg/dL — ABNORMAL HIGH (ref 65–99)
Potassium: 3.8 mmol/L (ref 3.5–5.1)
SODIUM: 134 mmol/L — AB (ref 135–145)

## 2017-03-06 LAB — URIC ACID: URIC ACID, SERUM: 15.7 mg/dL — AB (ref 4.4–7.6)

## 2017-03-06 MED ORDER — COLCHICINE 0.6 MG PO TABS
0.6000 mg | ORAL_TABLET | Freq: Every day | ORAL | Status: DC
  Administered 2017-03-06 – 2017-03-07 (×2): 0.6 mg via ORAL
  Filled 2017-03-06 (×2): qty 1

## 2017-03-06 MED ORDER — PREDNISONE 20 MG PO TABS
40.0000 mg | ORAL_TABLET | Freq: Every day | ORAL | Status: DC
  Administered 2017-03-06 – 2017-03-07 (×2): 40 mg via ORAL
  Filled 2017-03-06 (×2): qty 2

## 2017-03-06 MED ORDER — SACUBITRIL-VALSARTAN 24-26 MG PO TABS
1.0000 | ORAL_TABLET | Freq: Two times a day (BID) | ORAL | Status: DC
  Administered 2017-03-06 – 2017-03-07 (×3): 1 via ORAL
  Filled 2017-03-06 (×3): qty 1

## 2017-03-06 NOTE — Progress Notes (Signed)
Pt is awake alert in bed with no distress. Plan to walk the hall before going to sleep.

## 2017-03-06 NOTE — Progress Notes (Signed)
Advanced Heart Failure Rounding Note   Subjective:    Feeling good this am.  "Cramps" slightly better. Denies SOB walking halls. Still with some fatigue. HR up to 120s with activity.   Cramps improving. Uric acid 15.7  Out 900 cc though weight stable. Down 45 lbs total this am.   Creatinine 1.12 from 1.38 yesterday. K 3.8 this am.   RHC/LHC 03/01/2017 AO = 109/86 (97) LV = 101/28 RA = 29 RV = 50/22 PA = 55/24 (38) PCW = 26 Fick cardiac output/index = 6.4/2.6 Thermal CO/CI = 4.9/2.0 PVR = 2.5 WU FA sat = 98% PA sat = 69%, 71% SVC sat = 82%, 82% IVC sat = 66%  Assessment: 1. Normal coronary arteries  2. Mild to moderate pulmonary HTN with normal PVR 3. Biventricular volume overload with R > L heart failure 4. Moderately decreased CO by thermodilution  5. 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 No intra-cardiac shunt  02/2017 ECHO LV EF 40-45%. Moderate RV HK  Grade II DD.   Objective:   Weight Range:  Vital Signs:   Temp:  [97.6 F (36.4 C)-98.4 F (36.9 C)] 98.1 F (36.7 C) (04/23 0621) Pulse Rate:  [112-118] 112 (04/23 0621) Resp:  [18-20] 18 (04/23 0621) BP: (103-129)/(76-84) 129/81 (04/23 0621) SpO2:  [97 %-99 %] 97 % (04/23 0621) Weight:  [281 lb 8 oz (127.7 kg)] 281 lb 8 oz (127.7 kg) (04/23 0621) Last BM Date: 02/26/17  Weight change: Filed Weights   03/04/17 0328 03/05/17 0619 03/06/17 0621  Weight: 286 lb 9.6 oz (130 kg) 281 lb 3.2 oz (127.6 kg) 281 lb 8 oz (127.7 kg)    Intake/Output:   Intake/Output Summary (Last 24 hours) at 03/06/17 0758 Last data filed at 03/06/17 3888  Gross per 24 hour  Intake              720 ml  Output             1650 ml  Net             -930 ml     Physical Exam: General: Well appearing AA male. Ambulating room without difficulty.  HEENT: normal Neck: supple. JVD difficult due to body habitus but does not appear  elevated.   Cor: PMI nondisplaced. RRR, No M/G/R noted Lungs: CTAB, normal effort. Abdomen: soft, non-tender, distended, no HSM. No bruits or masses. +BS  Extremities: no cyanosis, clubbing, rash, R and LLE no edema.  Neuro: alert & orientedx3, cranial nerves grossly intact. moves all 4 extremities w/o difficulty. Affect pleasant  GU: Mild scrotal edema, though improved.   Telemetry: Personally reviewed, sinus tach 110-120s   Labs: Basic Metabolic Panel:  Recent Labs Lab 03/02/17 1057 03/03/17 0343 03/04/17 0605 03/05/17 0433 03/06/17 0454  NA 137 136 135 136 134*  K 4.0 3.5 3.6 3.6 3.8  CL 98* 94* 93* 92* 95*  CO2 30 27 28 30 27   GLUCOSE 155* 116* 99 145* 108*  BUN 15 17 23* 28* 28*  CREATININE 1.32* 1.19 1.20 1.38* 1.12  CALCIUM 9.4 10.1 10.0 10.3 10.0  MG  --   --   --  2.2  --     Liver Function Tests: No results for input(s): AST, ALT, ALKPHOS, BILITOT, PROT, ALBUMIN in the last 168 hours. No results for input(s): LIPASE, AMYLASE in the last 168 hours. No results for input(s): AMMONIA in  the last 168 hours.  CBC: No results for input(s): WBC, NEUTROABS, HGB, HCT, MCV, PLT in the last 168 hours.  Cardiac Enzymes: No results for input(s): CKTOTAL, CKMB, CKMBINDEX, TROPONINI in the last 168 hours.  BNP: BNP (last 3 results)  Recent Labs  02/19/17 1834 02/24/17 1508  BNP 340.8* 361.6*    ProBNP (last 3 results) No results for input(s): PROBNP in the last 8760 hours.  Other results:  Imaging: No results found.   Medications:     Scheduled Medications: . heparin  5,000 Units Subcutaneous Q8H  . potassium chloride  40 mEq Oral Daily  . sodium chloride flush  3 mL Intravenous Q12H  . sodium chloride flush  3 mL Intravenous Q12H  . spironolactone  25 mg Oral Daily  . torsemide  40 mg Oral Daily    Infusions: . sodium chloride    . sodium chloride      PRN Medications: sodium chloride, sodium chloride, acetaminophen, ALPRAZolam, ondansetron  (ZOFRAN) IV, sodium chloride flush, sodium chloride flush, zolpidem    Assessment/Plan/Discussion:    1. A/C Combined Systolic/Diastolic Heart Failure.  -- R>L. EF ~40%.NICM likely with sleep apnea component.  RHC/LHC normal cors. Elevated biventricular pressures.  --He has R>>L heart failure symptoms. Now down 45 pounds since admission.  - Volume status looks good on exam.   - Creatinine improved with transition to po meds.  --Continue torsemide 40 po mg daily.  - Start Entresto 24/26 mg BID.  --Continue spiro 25 mg daily.  2. Sinus Tach 1st degree heart block-  -- Holding BB with HF decompensation.  Hope this will improve with afterload reduction.  -- Will need BB once tolerated.  May eventually need Corlanor.  3. Morbid Obesity - Needs to lose weight. Encouraged portion control and increasing activity as able.  4. Suspected Sleep Apnea - Will arrange sleep study as outpatient.  5. Hypokalemia - Somewhat improved. Continue 40 meq daily for now with med adjustment - Mg stable.   6. HTN - Renal function improved and pressures in 120s this am.  - Will start entresto 24/26 mg BID. Follow renal function closely.   7. Acute gout - Has been c/o of leg cramps and joint pain - Uric acid 15. Likely from marked diuresis.  - Will give 40 mg prednisone x 2 for acute gout flare.   Length of Stay: [address]  Luane School  03/06/2017, 7:58 AM  Advanced Heart Failure Team Pager [phone] (M-F; 7a - 4p)  Please contact [facility] for night-coverage after hours (4p -7a ) and weekends on [email or web address]  Patient seen and examined with the above-signed Advanced Practice Provider and/or Housestaff. I personally reviewed laboratory data, imaging studies and relevant notes. I independently examined the patient and formulated the important aspects of the plan. I have edited the note to reflect any of my changes or salient points. I have personally discussed the plan with the patient and/or  family.  Volume status much improved. Scrotal edema completely resolved. Renal function better on po diuretics. Uric acid very high. Suspect his "cramps" may actually be gout. Will start prednisone and colchicine. Dosing discussed with PharmD.   Will ambulate today. Continue dietary education.  Hopefully home in am.   Arvilla Meres, MD  11:04 AM

## 2017-03-06 NOTE — Progress Notes (Signed)
Coupon for [employer] given given to patient with instructions of usage; pharmacy of choice is [facility] in [place]; CM called the pharmacy, they will order the medication and they will have it in stock tomorrow, if patient is discharged today, please send prescription to [facility]; [address] Shelba Flake [phone]

## 2017-03-07 LAB — BASIC METABOLIC PANEL
ANION GAP: 13 (ref 5–15)
BUN: 44 mg/dL — ABNORMAL HIGH (ref 6–20)
CHLORIDE: 92 mmol/L — AB (ref 101–111)
CO2: 27 mmol/L (ref 22–32)
Calcium: 10.1 mg/dL (ref 8.9–10.3)
Creatinine, Ser: 1.56 mg/dL — ABNORMAL HIGH (ref 0.61–1.24)
GFR calc non Af Amer: 57 mL/min — ABNORMAL LOW (ref >60)
Glucose, Bld: 213 mg/dL — ABNORMAL HIGH (ref 65–99)
POTASSIUM: 5 mmol/L (ref 3.5–5.1)
SODIUM: 132 mmol/L — AB (ref 135–145)

## 2017-03-07 MED ORDER — SACUBITRIL-VALSARTAN 24-26 MG PO TABS: 1.0000 | ORAL_TABLET | Freq: Two times a day (BID) | ORAL | 6 refills | Status: DC

## 2017-03-07 MED ORDER — SPIRONOLACTONE 25 MG PO TABS: 25.0000 mg | ORAL_TABLET | Freq: Every day | ORAL | 6 refills | Status: DC

## 2017-03-07 MED ORDER — TORSEMIDE 20 MG PO TABS
40.0000 mg | ORAL_TABLET | Freq: Every day | ORAL | Status: DC
  Filled 2017-03-07: qty 2

## 2017-03-07 MED ORDER — CARVEDILOL 3.125 MG PO TABS: 3.1250 mg | ORAL_TABLET | Freq: Two times a day (BID) | ORAL | 6 refills | Status: DC

## 2017-03-07 MED ORDER — CARVEDILOL 3.125 MG PO TABS: 3.1250 mg | ORAL_TABLET | Freq: Two times a day (BID) | ORAL | Status: DC

## 2017-03-07 MED ORDER — COLCHICINE 0.6 MG PO TABS: 0.6000 mg | ORAL_TABLET | Freq: Every day | ORAL | 6 refills | Status: DC

## 2017-03-07 MED ORDER — TORSEMIDE 20 MG PO TABS: 40.0000 mg | ORAL_TABLET | Freq: Every day | ORAL | 6 refills | Status: DC

## 2017-03-07 NOTE — Discharge Summary (Signed)
Advanced Heart Failure Discharge Note  Discharge Summary   Patient ID: Bradley Powell MRN: [id number], DOB/AGE: 12/23/1980 36 y.o. Admit date: 02/24/2017 D/C date:     03/07/2017   Primary Discharge Diagnoses:  1. Acute on chronic combined HF 2. Sinus tach with 1st degree heart block 3. Morbid obesity 4. Suspected sleep apnea 5. Hypokalemia 6. HTN 7. Acute gout flare  Hospital Course:   Bradley Powell is a 36 y.o. male with history of obesity, 1st degree heart block, and recent combined CHF diagnosis Echo 02/22/2017 LV EF 40-45%. Moderate RV HK Grade II DD. admitted with increased dyspnea and lower extremity edema. Diuresed on IV lasix.  AHF team consulted 03/01/17 with slow diuresis and for catheterization.   L/RHC with normal coronaries, mild/mod pulmonary HTN, and R>L HF with massive volume overload. (full cath as below).  Diuresis increased.  He diuresed well on IV lasix up to 80 mg TID and dose of metolazone. His SOB and scrotal edema gradually improved. Oral medications adjusted as tolerated.  Transitioned to [facility] prior to discharge. OSA/OHS thought to be large contributor to his dyspnea.  Plan to schedule sleep study as outpatient.   Hospital course complicated by acute "cramps" and joint pains that were attributed to gout with Uric Acid at 15.  Treated with prednisone and colchicine in house. Will continue colchicine and start on allopurinol once flare subsides completely.   Overall patient diuresed 32 L and down 45 lbs from admission weight. He will be discharged to home in stable condition with close follow up including 3 day BMET with bump in creatinine on day of discharge.   Discharge Weight: 281 lbs Discharge Vitals: Blood pressure 111/75, pulse (!) 108, temperature 97.7 F (36.5 C), temperature source Oral, resp. rate 18, height 5' 8.5" (1.74 m), weight 281 lb 12.8 oz (127.8 kg), SpO2 98 %.  Labs: Lab Results  Component Value Date   WBC 7.7 02/24/2017   HGB  13.5 02/24/2017   HCT 42.2 02/24/2017   MCV 82.9 02/24/2017   PLT 215 02/24/2017     Recent Labs Lab 03/07/17 0118  NA 132*  K 5.0  CL 92*  CO2 27  BUN 44*  CREATININE 1.56*  CALCIUM 10.1  GLUCOSE 213*   No results found for: CHOL, HDL, LDLCALC, TRIG BNP (last 3 results)  Recent Labs  02/19/17 1834 02/24/17 1508  BNP 340.8* 361.6*    ProBNP (last 3 results) No results for input(s): PROBNP in the last 8760 hours.   Diagnostic Studies/Procedures   RHC/LHC 03/01/2017 AO = 109/86 (97) LV = 101/28 RA = 29 RV = 50/22 PA = 55/24 (38) PCW = 26 Fick cardiac output/index = 6.4/2.6 Thermal CO/CI = 4.9/2.0 PVR = 2.5 WU FA sat = 98% PA sat = 69%, 71% SVC sat = 82%, 82% IVC sat = 66%  Assessment: 1. Normal coronary arteries  2. Mild to moderate pulmonary HTN with normal PVR 3. Biventricular volume overload with R > L heart failure 4. Moderately decreased CO by thermodilution  5. 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 No intra-cardiac shunt  02/2017 ECHO LV EF 40-45%. Moderate RV HK Grade II DD.  Discharge Medications   Allergies as of 03/07/2017   No Known Allergies     Medication List    STOP taking these medications   furosemide 40 MG tablet Commonly known as:  LASIX   potassium chloride  10 MEQ tablet Commonly known as:  K-DUR     TAKE these medications   acetaminophen 500 MG tablet Commonly known as:  TYLENOL Take 500 mg by mouth every 6 (six) hours as needed (pain).   carvedilol 3.125 MG tablet Commonly known as:  COREG Take 1 tablet (3.125 mg total) by mouth 2 (two) times daily with a meal.   colchicine 0.6 MG tablet Take 1 tablet (0.6 mg total) by mouth daily.   sacubitril-valsartan 24-26 MG Commonly known as:  ENTRESTO Take 1 tablet by mouth 2 (two) times daily.   spironolactone 25 MG tablet Commonly known as:  ALDACTONE Take 1 tablet (25 mg total)  by mouth daily.   torsemide 20 MG tablet Commonly known as:  DEMADEX Take 2 tablets (40 mg total) by mouth daily. Start taking on:  03/08/2017       Disposition   The patient will be discharged in stable condition to home. Discharge Instructions    (HEART FAILURE PATIENTS) Call MD:  Anytime you have any of the following symptoms: 1) 3 pound weight gain in 24 hours or 5 pounds in 1 week 2) shortness of breath, with or without a dry hacking cough 3) swelling in the hands, feet or stomach 4) if you have to sleep on extra pillows at night in order to breathe.    Complete by:  As directed    Call MD for:  persistant dizziness or light-headedness    Complete by:  As directed    Diet - low sodium heart healthy    Complete by:  As directed    Heart Failure patients record your daily weight using the same scale at the same time of day    Complete by:  As directed    Increase activity slowly    Complete by:  As directed      Follow-up Information    [facility] Follow up on 03/10/2017.   Specialty:  Cardiology Why:  at 1100 for labwork. Code for parking is 6000. Psychologist, sport and exercise through [employer] on [address], underground parking on your right.  Can also park in lower ED lot and enter blue awning.  Contact information: [address] [id number] mc [place] [address] [phone]       Arvilla Meres, MD Follow up on 03/16/2017.   Specialty:  Cardiology Why:  at 0900 am for post hospital follow up. Code for parking will be 6001. Psychologist, sport and exercise through [employer] on [address], underground parking on your right.  Can also park in lower ED lot and enter blue awning.  Contact information: [address] [place] [address] [phone]             Duration of Discharge Encounter: Greater than 34 minutes   Signed, Luane School 03/07/2017, 9:25 AM  Patient seen and examined with the  above-signed Advanced Practice Provider and/or Housestaff. I personally reviewed laboratory data, imaging studies and relevant notes. I independently examined the patient and formulated the important aspects of the plan. I have edited the note to reflect any of my changes or salient points. I have personally discussed the plan with the patient and/or family.  He has diuresed well. Ok for d/c. Will need close f/u of volume status and renal function in clinic.   Arvilla Meres, MD  11:24 PM

## 2017-03-07 NOTE — Progress Notes (Signed)
Pt has orders to be discharged. Discharge instructions given and pt has no additional questions at this time. Medication regimen reviewed and pt educated. Pt verbalized understanding and has no additional questions. Telemetry box removed. IV removed and site in good condition. Pt stable and waiting for transportation. 

## 2017-03-07 NOTE — Plan of Care (Signed)
Problem: Activity: Goal: Risk for activity intolerance will decrease Outcome: Progressing Patient ambulated in hallway. No reports of distress.

## 2017-03-07 NOTE — Progress Notes (Addendum)
Advanced Heart Failure Rounding Note   Subjective:    Feels ok this am. Now on po diuretics. I/Os relatively even. Creatinine up 1.1 to 1.5    RHC/LHC 03/01/2017 AO = 109/86 (97) LV = 101/28 RA = 29 RV = 50/22 PA = 55/24 (38) PCW = 26 Fick cardiac output/index = 6.4/2.6 Thermal CO/CI = 4.9/2.0 PVR = 2.5 WU FA sat = 98% PA sat = 69%, 71% SVC sat = 82%, 82% IVC sat = 66%  Assessment: 1. Normal coronary arteries  2. Mild to moderate pulmonary HTN with normal PVR 3. Biventricular volume overload with R > L heart failure 4. Moderately decreased CO by thermodilution  5. 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 No intra-cardiac shunt  02/2017 ECHO LV EF 40-45%. Moderate RV HK  Grade II DD.   Objective:   Weight Range:  Vital Signs:   Temp:  [97.8 F (36.6 C)-98.5 F (36.9 C)] 98.5 F (36.9 C) (04/23 2033) Pulse Rate:  [110-112] 110 (04/23 2033) Resp:  [18] 18 (04/23 2033) BP: (110-129)/(65-81) 115/65 (04/23 2033) SpO2:  [94 %-97 %] 94 % (04/23 2033) Weight:  [127.7 kg (281 lb 8 oz)] 127.7 kg (281 lb 8 oz) (04/23 0621) Last BM Date: 03/06/17  Weight change: Filed Weights   03/04/17 0328 03/05/17 0619 03/06/17 0621  Weight: 130 kg (286 lb 9.6 oz) 127.6 kg (281 lb 3.2 oz) 127.7 kg (281 lb 8 oz)    Intake/Output:   Intake/Output Summary (Last 24 hours) at 03/07/17 0516 Last data filed at 03/06/17 2033  Gross per 24 hour  Intake              720 ml  Output             2050 ml  Net            -1330 ml     Physical Exam: General: Lying in bed. NAD   HEENT: normal Neck: supple. JVP appears flat  Cor: PMI nondisplaced. Tachy regular  Lungs: clear. No wheeze. Abdomen: soft, non-tender, nondistended, obese , no HSM. No bruits or masses. Good BS Extremities: no cyanosis, clubbing, rash, edema resolved  Neuro: alert & orientedx3, cranial nerves grossly intact. moves all 4  extremities w/o difficulty. Affect pleasant  GU: scrotal edema resolved   Telemetry: sinus tach 110-115. Personally reviewed   Labs: Basic Metabolic Panel:  Recent Labs Lab 03/03/17 0343 03/04/17 0605 03/05/17 0433 03/06/17 0454 03/07/17 0118  NA 136 135 136 134* 132*  K 3.5 3.6 3.6 3.8 5.0  CL 94* 93* 92* 95* 92*  CO2 27 28 30 27 27   GLUCOSE 116* 99 145* 108* 213*  BUN 17 23* 28* 28* 44*  CREATININE 1.19 1.20 1.38* 1.12 1.56*  CALCIUM 10.1 10.0 10.3 10.0 10.1  MG  --   --  2.2  --   --     Liver Function Tests: No results for input(s): AST, ALT, ALKPHOS, BILITOT, PROT, ALBUMIN in the last 168 hours. No results for input(s): LIPASE, AMYLASE in the last 168 hours. No results for input(s): AMMONIA in the last 168 hours.  CBC: No results for input(s): WBC, NEUTROABS, HGB, HCT, MCV, PLT in the last 168 hours.  Cardiac Enzymes: No results for input(s): CKTOTAL, CKMB, CKMBINDEX, TROPONINI in the last 168 hours.  BNP: BNP (last 3 results)  Recent Labs  02/19/17 1834 02/24/17 1508  BNP  340.8* 361.6*    ProBNP (last 3 results) No results for input(s): PROBNP in the last 8760 hours.  Other results:  Imaging: No results found.   Medications:     Scheduled Medications: . colchicine  0.6 mg Oral Daily  . heparin  5,000 Units Subcutaneous Q8H  . potassium chloride  40 mEq Oral Daily  . predniSONE  40 mg Oral Q breakfast  . sacubitril-valsartan  1 tablet Oral BID  . sodium chloride flush  3 mL Intravenous Q12H  . sodium chloride flush  3 mL Intravenous Q12H  . spironolactone  25 mg Oral Daily  . torsemide  40 mg Oral Daily    Infusions: . sodium chloride    . sodium chloride      PRN Medications: sodium chloride, sodium chloride, acetaminophen, ALPRAZolam, ondansetron (ZOFRAN) IV, sodium chloride flush, sodium chloride flush, zolpidem    Assessment/Plan/Discussion:    1. A/C Combined Systolic/Diastolic Heart Failure.  -- R>L. EF ~40%.NICM likely  with sleep apnea component.  RHC/LHC normal cors. Elevated biventricular pressures.  --He has R>>L heart failure symptoms. Now down 45 pounds since admission.  - Volume status looks good. Probably on dry side today with AKI.   --Continue torsemide 40 po mg daily as I suspect home intake will increase. I held todays dose. .  - Continue  Entresto 24/26 mg BID.  --Continue spiro 25 mg daily.  - stop KCL 2. Sinus Tach 1st degree heart block-  -- Would start back carvedilol 3.125 bid 3. Morbid Obesity - Needs to lose weight. Encouraged portion control and increasing activity as able.  4. Suspected Sleep Apnea - Will arrange sleep study as outpatient.  5. Hypokalemia - resolved  - Mg stable.   6. HTN - Renal function improved and pressures in 120s this am.  - Will start entresto 24/26 mg BID. Follow renal function closely.   7. Acute gout - Has been c/o of leg cramps and joint pain - Uric acid 15. Likely from marked diuresis.  - Will give 40 mg prednisone x 2 for acute gout flare. Today is day 2/2   Can go home today. Would check BMET on Friday  Length of Stay: 11  Arvilla Meres, MD  03/07/2017, 5:16 AM  Advanced Heart Failure Team Pager [phone] (M-F; 7a - 4p)  Please contact [facility] for night-coverage after hours (4p -7a ) and weekends on [email or web address]

## 2017-03-07 NOTE — Plan of Care (Signed)
Problem: Education: Goal: Ability to verbalize understanding of medication therapies will improve Outcome: Progressing Patient received medication education on his entresto medication. Verbalized understanding. No questions at this time.

## 2017-03-08 ENCOUNTER — Telehealth (HOSPITAL_COMMUNITY): Payer: Self-pay | Admitting: Pharmacist

## 2017-03-08 NOTE — Telephone Encounter (Signed)
[name] called stating that he has felt extremely dizzy since taking his medications this morning and even fell upon standing while I was on the phone with him. I have asked him to take his BP on his father's automatic cuff and was told that it is not reading his BP although it works on his father. Discussed with Otilio Saber, PA-C who recommended to hold his torsemide and Entresto, drink some fluid, and follow up with his lab work tomorrow morning as scheduled. Will also get a BP here tomorrow. I have advised his father to take him to the ED if he ever loses consciousness or has any other concerning symptoms including chest pain or palpitations. Father verbalized understanding.   [name], PharmD, BCPS, CPP Clinical Pharmacist Pager: [phone] Phone: [phone] 03/08/2017 11:27 AM

## 2017-03-09 ENCOUNTER — Ambulatory Visit (HOSPITAL_COMMUNITY)
Admission: RE | Admit: 2017-03-09 | Discharge: 2017-03-09 | Disposition: A | Discharge status: Home / Self Care | Payer: Self-pay | Source: Ambulatory Visit | Attending: Cardiology | Admitting: Cardiology

## 2017-03-09 ENCOUNTER — Other Ambulatory Visit (HOSPITAL_COMMUNITY): Payer: Self-pay

## 2017-03-09 ENCOUNTER — Encounter (HOSPITAL_COMMUNITY): Payer: Self-pay

## 2017-03-09 VITALS — BP 95/60 | HR 112 | Wt 282.8 lb

## 2017-03-09 DIAGNOSIS — I110 Hypertensive heart disease with heart failure: Secondary | ICD-10-CM | POA: Insufficient documentation

## 2017-03-09 DIAGNOSIS — Z6841 Body Mass Index (BMI) 40.0 and over, adult: Secondary | ICD-10-CM | POA: Insufficient documentation

## 2017-03-09 DIAGNOSIS — Z8249 Family history of ischemic heart disease and other diseases of the circulatory system: Secondary | ICD-10-CM | POA: Insufficient documentation

## 2017-03-09 DIAGNOSIS — Z823 Family history of stroke: Secondary | ICD-10-CM | POA: Insufficient documentation

## 2017-03-09 DIAGNOSIS — R29818 Other symptoms and signs involving the nervous system: Secondary | ICD-10-CM

## 2017-03-09 DIAGNOSIS — I440 Atrioventricular block, first degree: Secondary | ICD-10-CM | POA: Insufficient documentation

## 2017-03-09 DIAGNOSIS — Z79899 Other long term (current) drug therapy: Secondary | ICD-10-CM | POA: Insufficient documentation

## 2017-03-09 DIAGNOSIS — R55 Syncope and collapse: Secondary | ICD-10-CM | POA: Insufficient documentation

## 2017-03-09 DIAGNOSIS — I951 Orthostatic hypotension: Secondary | ICD-10-CM

## 2017-03-09 DIAGNOSIS — R000 Tachycardia, unspecified: Secondary | ICD-10-CM

## 2017-03-09 DIAGNOSIS — I5040 Unspecified combined systolic (congestive) and diastolic (congestive) heart failure: Secondary | ICD-10-CM | POA: Insufficient documentation

## 2017-03-09 DIAGNOSIS — M109 Gout, unspecified: Secondary | ICD-10-CM | POA: Insufficient documentation

## 2017-03-09 DIAGNOSIS — I5022 Chronic systolic (congestive) heart failure: Secondary | ICD-10-CM

## 2017-03-09 LAB — BASIC METABOLIC PANEL
ANION GAP: 10 (ref 5–15)
BUN: 72 mg/dL — AB (ref 6–20)
CHLORIDE: 98 mmol/L — AB (ref 101–111)
CO2: 23 mmol/L (ref 22–32)
Calcium: 9.8 mg/dL (ref 8.9–10.3)
Creatinine, Ser: 2.08 mg/dL — ABNORMAL HIGH (ref 0.61–1.24)
GFR calc non Af Amer: 40 mL/min — ABNORMAL LOW (ref >60)
GFR, EST AFRICAN AMERICAN: 47 mL/min — AB (ref >60)
Glucose, Bld: 115 mg/dL — ABNORMAL HIGH (ref 65–99)
POTASSIUM: 4.4 mmol/L (ref 3.5–5.1)
SODIUM: 131 mmol/L — AB (ref 135–145)

## 2017-03-09 LAB — BRAIN NATRIURETIC PEPTIDE: B Natriuretic Peptide: 58.9 pg/mL (ref 0.0–100.0)

## 2017-03-09 NOTE — Progress Notes (Signed)
PCP: Dr Nehemiah Settle  Primary HF Cardiologist: Dr [name]  HPI: Bradley Powell is a 36 year old with a history of combined systolic/diastolic heart failure, Sinus Tach 1st degree heart block, obesity, htn, and gout.   Admitted 4/13 through 03/07/17 with marked volume overload. Diuresed with IV lasix tid + metolazone.Had RHC/LHC as noted below. Overall diuresed 45 pounds. On the day of discharge carvedilol was restarted. Discharge weight was 281 pounds.   Today he returns for post hospital/ acute work in due to syncope. He felt ok the night he was discharged but yesterday he was fatigued and dizzy. Yesterday he had a syncopal episode about 15 minues after he took morning HTN medicaitons. Had 2 other episodes when he went to stand up.  He called the HF clinic and was instructed to hold medications. Last night he did not take any medications. Today he is feeling better.  Denies SOB/PND/Orthopnea. Weight at home 280 pounds. Appetite fair. No fever or chills. He is currently not working.   RHC/LHC 03/01/2017  AO = 109/86 (97) LV =  101/28 RA =  29 RV = 50/22 PA = 55/24 (38) PCW = 26 Fick cardiac output/index = 6.4/2.6 Thermal CO/CI = 4.9/2.0 PVR = 2.5 WU FA sat = 98% PA sat = 69%, 71% SVC sat = 82%, 82% IVC sat = 66% Assessment: 1. Normal coronary arteries  2. Mild to moderate pulmonary HTN with normal PVR 3. Biventricular volume overload with R > L heart failure 4. Moderately decreased CO by thermodilution  5. 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 No intra-cardiac shunt    ROS: All systems negative except as listed in HPI, PMH and Problem List.  SH:  Social History   Social History  . Marital status: Single    Spouse name: N/A  . Number of children: N/A  . Years of education: N/A   Occupational History  . Not on file.   Social History Main Topics  . Smoking status: Never Smoker  . Smokeless  tobacco: Never Used  . Alcohol use Yes     Comment: rarely  . Drug use: No  . Sexual activity: Not on file   Other Topics Concern  . Not on file   Social History Narrative  . No narrative on file    FH:  Family History  Problem Relation Age of Onset  . CVA Mother     pacemaker  . Multiple sclerosis Mother   . Seizures Mother   . Hypertension Father   . Gout Father     Past Medical History:  Diagnosis Date  . History of esophagogastroduodenoscopy (EGD)     Current Outpatient Prescriptions  Medication Sig Dispense Refill  . acetaminophen (TYLENOL) 500 MG tablet Take 500 mg by mouth every 6 (six) hours as needed (pain).    . carvedilol (COREG) 3.125 MG tablet Take 1 tablet (3.125 mg total) by mouth 2 (two) times daily with a meal. 60 tablet 6  . sacubitril-valsartan (ENTRESTO) 24-26 MG Take 1 tablet by mouth 2 (two) times daily. 60 tablet 6  . spironolactone (ALDACTONE) 25 MG tablet Take 1 tablet (25 mg total) by mouth daily. 30 tablet 6  . torsemide (DEMADEX) 20 MG tablet Take 2 tablets (40 mg total) by mouth daily. 60 tablet 6  . colchicine 0.6 MG tablet Take 1 tablet (0.6 mg total) by mouth daily. (Patient not taking: Reported on 03/09/2017) 30  tablet 6   No current facility-administered medications for this encounter.     Vitals:   03/09/17 1105  BP: 95/60  Pulse: (!) 112  SpO2: 97%  Weight: 282 lb 12.8 oz (128.3 kg)   Filed Weights   03/09/17 1105  Weight: 282 lb 12.8 oz (128.3 kg)    PHYSICAL EXAM: Sitting 95/60  Standing 80/58 General:  Well appearing. No resp difficulty. Walked in the clinic with his father.  HEENT: normal Neck: supple. JVP flat. Carotids 2+ bilaterally; no bruits. No lymphadenopathy or thryomegaly appreciated. Cor: PMI normal. Tachy Regular rate & rhythm. No rubs, gallops or murmurs. Lungs: clear Abdomen: obese, soft, nontender, nondistended. No hepatosplenomegaly. No bruits or masses. Good bowel sounds. Extremities: no cyanosis,  clubbing, rash, edema Neuro: alert & orientedx3, cranial nerves grossly intact. Moves all 4 extremities w/o difficulty. Affect pleasant. Skin: Warm   ECG: Sinus Tach 107 bpm  PR 232 ms   ASSESSMENT & PLAN: 1. Combined Systolic/Diastolic Heart Failure- ECHO 02/22/2017 EF 40-45% Grade II DD. Peak PA pressure 46 mmhg.  NYHA II. He is orthostatic today in the clinic as noted above. I personally check orthostatic blood pressure.  Hold diuretics torsemide and spiro.   Hold entresto for 2 days then restart 24-26 mg twice a day.  Check BMET today.  I think we need to allow his dry weight to drift up a bit. I have asked him to call HF clinic on Monday.  May need to restart torsemide or spiro at that time.  2. Syncope--> orthostatic hypotension On 03/08/17 at his home. This is suspected to be from orthostatic hypotension. He is orthostatic today. I have asked him to not drive until we can sort this out. If he has another episode will need to place 30 day event monitor.  3. Suspected Sleep Apnea Set up for sleep study  4. Sinus Tach with 1st degree heart block- hold bb for now with hypotension.  5. Morbid Obesity: Body mass index is 42.37 kg/m.  Discussed portion control.   Follow up next week. Greater than 50% of the (total minutes 26) visit spent in counseling/coordination of care regarding heart failure, volume depletion, and HF diet.   Amy Clegg NP-C  2:48 PM

## 2017-03-09 NOTE — Patient Instructions (Signed)
Labs today (will call for abnormal results, otherwise no news is good news)  STOP taking Carvedilol  HOLD Entresto, Spironolactone, and Torsemide for today and tomorrow.  RESTART Entresto on Saturday.  Continue holding Spironolactone and Torsemide until you hear from Bradley Becket, NP.  Record daily weights and bring them with you to your next appointment.  You have been referred to have a sleep study done, we will schedule this appointment for you at check out.   NO DRIVING until we tell you otherwise.

## 2017-03-10 ENCOUNTER — Other Ambulatory Visit (HOSPITAL_COMMUNITY): Payer: Self-pay

## 2017-03-13 ENCOUNTER — Telehealth (HOSPITAL_COMMUNITY): Payer: Self-pay

## 2017-03-13 NOTE — Telephone Encounter (Signed)
Patient left VM on CHF clinic triage line stating he was told by [name] NP-C last week to call us today but does not know why. Per her note he was supposed to report his weights at home to see if his dry weight came up any to dose diuretics. Left return VM to patient to call us back with this info and reminded of his f/u apt with [place] on Thursday of this week.  Ave Filter, RN

## 2017-03-13 NOTE — Telephone Encounter (Signed)
  He called to report weights from the weekend.   Over the weekend he stopped all HF meds due to N/V Weight at home 279-281 . He has not had additional swelling.   He tried to restart [employer] but had N/V. He denies further syncopal episodes.   I have asked him to hold HF meds. He will return for follow up on Thursday. Plan to check BMET and Uric Acid at that time.  Bradley Powell verbalized understanding. Amy Clegg NP-C 3:01 PM

## 2017-03-16 ENCOUNTER — Encounter (HOSPITAL_COMMUNITY): Payer: Self-pay

## 2017-03-16 ENCOUNTER — Ambulatory Visit (HOSPITAL_COMMUNITY)
Admission: RE | Admit: 2017-03-16 | Discharge: 2017-03-16 | Disposition: A | Discharge status: Home / Self Care | Payer: Self-pay | Source: Ambulatory Visit | Attending: Cardiology | Admitting: Cardiology

## 2017-03-16 VITALS — BP 112/70 | HR 116 | Wt 275.5 lb

## 2017-03-16 DIAGNOSIS — R112 Nausea with vomiting, unspecified: Secondary | ICD-10-CM

## 2017-03-16 DIAGNOSIS — Z6841 Body Mass Index (BMI) 40.0 and over, adult: Secondary | ICD-10-CM | POA: Insufficient documentation

## 2017-03-16 DIAGNOSIS — I951 Orthostatic hypotension: Secondary | ICD-10-CM | POA: Insufficient documentation

## 2017-03-16 DIAGNOSIS — I110 Hypertensive heart disease with heart failure: Secondary | ICD-10-CM | POA: Insufficient documentation

## 2017-03-16 DIAGNOSIS — Z79899 Other long term (current) drug therapy: Secondary | ICD-10-CM | POA: Insufficient documentation

## 2017-03-16 DIAGNOSIS — M109 Gout, unspecified: Secondary | ICD-10-CM | POA: Insufficient documentation

## 2017-03-16 DIAGNOSIS — I440 Atrioventricular block, first degree: Secondary | ICD-10-CM | POA: Insufficient documentation

## 2017-03-16 DIAGNOSIS — I5042 Chronic combined systolic (congestive) and diastolic (congestive) heart failure: Secondary | ICD-10-CM | POA: Insufficient documentation

## 2017-03-16 DIAGNOSIS — I5022 Chronic systolic (congestive) heart failure: Secondary | ICD-10-CM

## 2017-03-16 DIAGNOSIS — R000 Tachycardia, unspecified: Secondary | ICD-10-CM

## 2017-03-16 DIAGNOSIS — K30 Functional dyspepsia: Secondary | ICD-10-CM | POA: Insufficient documentation

## 2017-03-16 LAB — BASIC METABOLIC PANEL
ANION GAP: 9 (ref 5–15)
BUN: 24 mg/dL — AB (ref 6–20)
CO2: 28 mmol/L (ref 22–32)
Calcium: 10.1 mg/dL (ref 8.9–10.3)
Chloride: 98 mmol/L — ABNORMAL LOW (ref 101–111)
Creatinine, Ser: 1.67 mg/dL — ABNORMAL HIGH (ref 0.61–1.24)
GFR calc non Af Amer: 52 mL/min — ABNORMAL LOW (ref >60)
Glucose, Bld: 115 mg/dL — ABNORMAL HIGH (ref 65–99)
POTASSIUM: 4.7 mmol/L (ref 3.5–5.1)
SODIUM: 135 mmol/L (ref 135–145)

## 2017-03-16 LAB — CBC
HCT: 47.8 % (ref 39.0–52.0)
HEMOGLOBIN: 15.6 g/dL (ref 13.0–17.0)
MCH: 26.4 pg (ref 26.0–34.0)
MCHC: 32.6 g/dL (ref 30.0–36.0)
MCV: 81 fL (ref 78.0–100.0)
Platelets: 229 10*3/uL (ref 150–400)
RBC: 5.9 MIL/uL — ABNORMAL HIGH (ref 4.22–5.81)
RDW: 14.4 % (ref 11.5–15.5)
WBC: 8.1 10*3/uL (ref 4.0–10.5)

## 2017-03-16 LAB — URIC ACID: Uric Acid, Serum: 11.1 mg/dL — ABNORMAL HIGH (ref 4.4–7.6)

## 2017-03-16 MED ORDER — PREDNISONE 10 MG PO TABS: ORAL_TABLET | ORAL | 0 refills | Status: DC

## 2017-03-16 NOTE — Addendum Note (Signed)
Encounter addended by: Marcy Siren, LCSW on: 03/16/2017 11:19 AM<BR>    Actions taken: Sign clinical note

## 2017-03-16 NOTE — Patient Instructions (Addendum)
Routine lab work today. Will notify you of abnormal results, otherwise no news is good news!  START prednisone 30 mg (3 tabs) once daily with breakfast for 3 days.  Continue holding all other medications until instructed by our office to resume.  Follow up 2 weeks with Otilio Saber PA-C.  Do the following things EVERYDAY: 1) Weigh yourself in the morning before breakfast. Write it down and keep it in a log. 2) Take your medicines as prescribed 3) Eat low salt foods-Limit salt (sodium) to 2000 mg per day.  4) Stay as active as you can everyday 5) Limit all fluids for the day to less than 2 liters

## 2017-03-16 NOTE — Progress Notes (Addendum)
Advanced Heart Failure Clinic Note   PCP: Dr Nehemiah Settle  Primary HF Cardiologist: Dr [name]  HPI: Bradley Powell is a 36 y.o. male with a history of combined systolic/diastolic heart failure, Sinus Tach 1st degree heart block, obesity, htn, and gout.   Admitted 4/13 through 03/07/17 with marked volume overload. Diuresed with IV lasix tid + metolazone.Had RHC/LHC as noted below. Overall diuresed 45 pounds. On the day of discharge carvedilol was restarted. Discharge weight was 281 pounds.   He returns today for weekly follow up. Last week seen for syncope and thought to be volume depleted.  All HF meds held. Has had vomiting up until yesterday with poor appetite. Still just not feeling good overall. Has had trouble keeping his food down. Denies dysphagia. Denies fevers or chills. Denies diarrheas. + BM, solid and normal color. Gout flair started on Tuesday in both feet. Denies any SOB. Denies orthopnea or PND. Weight at home down to 275 from 280 last week. Not working.   RHC/LHC 03/01/2017  AO = 109/86 (97) LV =  101/28 RA =  29 RV = 50/22 PA = 55/24 (38) PCW = 26 Fick cardiac output/index = 6.4/2.6 Thermal CO/CI = 4.9/2.0 PVR = 2.5 WU FA sat = 98% PA sat = 69%, 71% SVC sat = 82%, 82% IVC sat = 66% Assessment: 1. Normal coronary arteries  2. Mild to moderate pulmonary HTN with normal PVR 3. Biventricular volume overload with R > L heart failure 4. Moderately decreased CO by thermodilution  5. 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 No intra-cardiac shunt  Review of systems complete and found to be negative unless listed in HPI.    SH:  Social History   Social History  . Marital status: Single    Spouse name: N/A  . Number of children: N/A  . Years of education: N/A   Occupational History  . Not on file.   Social History Main Topics  . Smoking status: Never Smoker  . Smokeless  tobacco: Never Used  . Alcohol use Yes     Comment: rarely  . Drug use: No  . Sexual activity: Not on file   Other Topics Concern  . Not on file   Social History Narrative  . No narrative on file    FH:  Family History  Problem Relation Age of Onset  . CVA Mother     pacemaker  . Multiple sclerosis Mother   . Seizures Mother   . Hypertension Father   . Gout Father     Past Medical History:  Diagnosis Date  . History of esophagogastroduodenoscopy (EGD)     Current Outpatient Prescriptions  Medication Sig Dispense Refill  . acetaminophen (TYLENOL) 500 MG tablet Take 500 mg by mouth every 6 (six) hours as needed (pain).    . colchicine 0.6 MG tablet Take 1 tablet (0.6 mg total) by mouth daily. (Patient not taking: Reported on 03/09/2017) 30 tablet 6  . sacubitril-valsartan (ENTRESTO) 24-26 MG Take 1 tablet by mouth 2 (two) times daily. (Patient not taking: Reported on 03/16/2017) 60 tablet 6  . spironolactone (ALDACTONE) 25 MG tablet Take 1 tablet (25 mg total) by mouth daily. (Patient not taking: Reported on 03/16/2017) 30 tablet 6  . torsemide (DEMADEX) 20 MG tablet Take 2 tablets (40 mg total) by mouth daily. (Patient not taking: Reported on 03/16/2017) 60 tablet 6   No current facility-administered medications for  this encounter.    Vitals:   03/16/17 0939  BP: 112/70  Pulse: (!) 116  SpO2: 98%  Weight: 275 lb 8 oz (125 kg)   Wt Readings from Last 3 Encounters:  03/16/17 275 lb 8 oz (125 kg)  03/09/17 282 lb 12.8 oz (128.3 kg)  03/07/17 281 lb 12.8 oz (127.8 kg)   PHYSICAL EXAM: General: Well appearing. NAD. Used walker to get in due to gout flare.  HEENT: Normal Neck: supple. JVD flat. Carotids 2+ bilat; no bruits. No thyromegaly or nodule noted. Cor: PMI nondisplaced. RRR, No M/G/R noted Lungs: CTAB, normal effort. Abdomen: soft, non-tender, distended, no HSM. No bruits or masses. +BS  Extremities: no cyanosis, clubbing, rash, R and LLE no edema.  Neuro: Alert  & orientedx3, cranial nerves grossly intact. moves all 4 extremities w/o difficulty. Affect flat   ASSESSMENT & PLAN: 1. Combined Systolic/Diastolic Heart Failure- ECHO 02/22/2017 EF 40-45% Grade II DD. Peak PA pressure 46 mmhg.  NYHA II - Lightheadedness and dizziness improved. No further syncope. Think this was very likely vasovagal. Volume status remains stable to dry. Continue to hold torsemide and spiro.   - Will not re-challenge on Entresto with EF > 40 % and hypotension/orthostasis on. BMET today.  - Weight down with GI bug. No meds added back today. Needs to let us know once weight trending back up. Keeping close follow up.  2. Syncope--> orthostatic hypotension - No further. Plan as above. If were to recur, would need 30 day monitor.  3. Suspected Sleep Apnea - Will need sleep study. Has no insurance. HFCSW to see today.  4. Sinus Tach with 1st degree heart block - Remains slightly tachycardic. Holding BB with hypotension. Hopefully can add back gently at next visit.  5. Morbid Obesity: Body mass index is 41.28 kg/m.   - Discussed weight loss and portion control. 6. Gout - Will give 30 mg prednisone x 3 days. Encouraged PCP follow up.  7. N/V : GI upset.  - CO OK on RHC so doubt low output.  - Seems to improving. Continue symptomatic treatment.    BMET and Uric acid today. Short course prednisone for gout flare. Needs to see PCP for further. HFSW to see about insurance +/- paramedicine.   Graciella Freer, PA-C  9:49 AM  Greater than 50% of the 25 minute visit was spent in counseling/coordination of care regarding disease state education, discussion with HFCSW, medication regimen, and salt/fluid restriction once his appetite improves.

## 2017-03-16 NOTE — Progress Notes (Signed)
CSW referred to assist patient with insurance as he is newly diagnosed HF and limited resources. Patient is  36 yo male who is single and works as a Location manager for [employer]. Patient reports he has tried multiple options to obtain private insurance to no avail. Patient states he is eligible for Affordable Healthcare Act although can't begin until open enrollment in November, 2018. Patient states that he was recently hospitalized and diagnosed with HF. Patient states "I probably had it for years and didn't know it". Patient struggling with multiple hospital bills and asking for assistance to bridge the gap until he is able to apply for Affordable Healthcare in November. CSW discussed the [facility] discount card and provided application and information to follow up. CSW also suggested referral to Paramedicine for education and support for newly diagnosed HF. Patient agreeable to referral. Patient grateful for assistance and agreeable to follow up with [facility] Discount and Paramedicine program. CSW available for further assistance as needed. Lasandra Beech, LCSW, CCSW-MCS [phone]

## 2017-03-27 ENCOUNTER — Other Ambulatory Visit (HOSPITAL_COMMUNITY): Payer: Self-pay | Admitting: *Deleted

## 2017-03-27 MED ORDER — COLCHICINE 0.6 MG PO TABS: 0.6000 mg | ORAL_TABLET | Freq: Every day | ORAL | 6 refills | Status: DC

## 2017-03-30 ENCOUNTER — Encounter (HOSPITAL_COMMUNITY): Payer: Self-pay

## 2017-03-30 ENCOUNTER — Ambulatory Visit (HOSPITAL_COMMUNITY)
Admission: RE | Admit: 2017-03-30 | Discharge: 2017-03-30 | Disposition: A | Discharge status: Home / Self Care | Payer: Self-pay | Source: Ambulatory Visit | Attending: Internal Medicine | Admitting: Internal Medicine

## 2017-03-30 ENCOUNTER — Other Ambulatory Visit (HOSPITAL_COMMUNITY): Payer: Self-pay

## 2017-03-30 VITALS — BP 118/78 | HR 98 | Ht 68.5 in | Wt 286.4 lb

## 2017-03-30 DIAGNOSIS — M109 Gout, unspecified: Secondary | ICD-10-CM | POA: Insufficient documentation

## 2017-03-30 DIAGNOSIS — I110 Hypertensive heart disease with heart failure: Secondary | ICD-10-CM | POA: Insufficient documentation

## 2017-03-30 DIAGNOSIS — I440 Atrioventricular block, first degree: Secondary | ICD-10-CM | POA: Insufficient documentation

## 2017-03-30 DIAGNOSIS — I5042 Chronic combined systolic (congestive) and diastolic (congestive) heart failure: Secondary | ICD-10-CM | POA: Insufficient documentation

## 2017-03-30 DIAGNOSIS — I5022 Chronic systolic (congestive) heart failure: Secondary | ICD-10-CM

## 2017-03-30 DIAGNOSIS — R29818 Other symptoms and signs involving the nervous system: Secondary | ICD-10-CM

## 2017-03-30 DIAGNOSIS — Z6841 Body Mass Index (BMI) 40.0 and over, adult: Secondary | ICD-10-CM | POA: Insufficient documentation

## 2017-03-30 DIAGNOSIS — R000 Tachycardia, unspecified: Secondary | ICD-10-CM

## 2017-03-30 LAB — BASIC METABOLIC PANEL
Anion gap: 8 (ref 5–15)
BUN: 9 mg/dL (ref 6–20)
CHLORIDE: 102 mmol/L (ref 101–111)
CO2: 26 mmol/L (ref 22–32)
CREATININE: 1.11 mg/dL (ref 0.61–1.24)
Calcium: 9.3 mg/dL (ref 8.9–10.3)
GFR calc non Af Amer: >60 mL/min (ref >60)
Glucose, Bld: 110 mg/dL — ABNORMAL HIGH (ref 65–99)
Potassium: 4.2 mmol/L (ref 3.5–5.1)
Sodium: 136 mmol/L (ref 135–145)

## 2017-03-30 MED ORDER — CARVEDILOL 3.125 MG PO TABS: 3.1250 mg | ORAL_TABLET | Freq: Two times a day (BID) | ORAL | 3 refills | Status: DC

## 2017-03-30 MED ORDER — TORSEMIDE 20 MG PO TABS: 20.0000 mg | ORAL_TABLET | ORAL | 3 refills | Status: DC | PRN

## 2017-03-30 MED ORDER — SPIRONOLACTONE 25 MG PO TABS: 12.5000 mg | ORAL_TABLET | Freq: Every day | ORAL | 3 refills | Status: DC

## 2017-03-30 NOTE — Progress Notes (Signed)
Advanced Heart Failure Clinic Note   PCP: Dr Nehemiah Settle  Primary HF Cardiologist: Dr [name]  HPI: Bradley Powell is a 36 y.o. male with a history of combined systolic/diastolic heart failure, Sinus Tach 1st degree heart block, obesity, htn, and gout.   Admitted 4/13 through 03/07/17 with marked volume overload. Diuresed with IV lasix tid + metolazone.Had RHC/LHC as noted below. Overall diuresed 45 pounds. On the day of discharge carvedilol was restarted. Discharge weight was 281 pounds.   He presents today for for regular follow up. Weight up 11 lbs since last visit.  Has been off medicines with orthostasis and syncope. Gout improved with prednisone. Appetite better thinks that's why his weight is up.  Mild SOB with moderate exertion. No SOB with ADLs or changing clothes or bathing.   Weight at home up to 286 (up from 28).  Denies lightheadedness or dizziness. Denies orthopnea or PND.   RHC/LHC 03/01/2017  AO = 109/86 (97) LV =  101/28 RA =  29 RV = 50/22 PA = 55/24 (38) PCW = 26 Fick cardiac output/index = 6.4/2.6 Thermal CO/CI = 4.9/2.0 PVR = 2.5 WU FA sat = 98% PA sat = 69%, 71% SVC sat = 82%, 82% IVC sat = 66% Assessment: 1. Normal coronary arteries  2. Mild to moderate pulmonary HTN with normal PVR 3. Biventricular volume overload with R > L heart failure 4. Moderately decreased CO by thermodilution  5. 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 No intra-cardiac shunt  Review of systems complete and found to be negative unless listed in HPI.    SH:  Social History   Social History  . Marital status: Single    Spouse name: N/A  . Number of children: N/A  . Years of education: N/A   Occupational History  . Not on file.   Social History Main Topics  . Smoking status: Never Smoker  . Smokeless tobacco: Never Used  . Alcohol use Yes     Comment: rarely  . Drug use: No  . Sexual  activity: Not on file   Other Topics Concern  . Not on file   Social History Narrative  . No narrative on file    FH:  Family History  Problem Relation Age of Onset  . CVA Mother        pacemaker  . Multiple sclerosis Mother   . Seizures Mother   . Hypertension Father   . Gout Father     Past Medical History:  Diagnosis Date  . History of esophagogastroduodenoscopy (EGD)     Current Outpatient Prescriptions  Medication Sig Dispense Refill  . acetaminophen (TYLENOL) 500 MG tablet Take 500 mg by mouth every 6 (six) hours as needed (pain).    . colchicine 0.6 MG tablet Take 1 tablet (0.6 mg total) by mouth daily. 30 tablet 6   No current facility-administered medications for this encounter.    Vitals:   03/30/17 0913  BP: 118/78  Pulse: 98  SpO2: 99%  Weight: 286 lb 6 oz (129.9 kg)  Height: 5' 8.5" (1.74 m)   Wt Readings from Last 3 Encounters:  03/30/17 286 lb 6 oz (129.9 kg)  03/16/17 275 lb 8 oz (125 kg)  03/09/17 282 lb 12.8 oz (128.3 kg)   PHYSICAL EXAM: General: Well appearing. No resp difficulty. HEENT: Normal Neck: supple. JVD 7-8. Carotids 2+ bilat; no bruits. No thyromegaly or nodule noted.  Cor: PMI nondisplaced. RRR, No M/G/R noted Lungs: CTAB, normal effort. Abdomen: soft, non-tender, distended, no HSM. No bruits or masses. +BS  Extremities: no cyanosis, clubbing, or rash. Trace ankle edema at most. .  Neuro: alert & orientedx3, cranial nerves grossly intact. moves all 4 extremities w/o difficulty. Affect pleasant   ASSESSMENT & PLAN: 1. Combined Systolic/Diastolic Heart Failure- ECHO 02/22/2017 EF 40-45% Grade II DD. Peak PA pressure 46 mmhg.  - Doing better. NYHA II-III.  - Volume status OK to very mildly up on exam.  Has not needed torsemide. Have asked him to take 20 mg as needed only.  - Will not re-challenge on Entresto with EF > 40 % and hypotension/orthostasis on.  - Will add coreg 3.125 mg BID - Will add spiro 12.5 mg qhs. - Consider  losartan at next visit if tolerates.  - Reinforced fluid restriction to < 2 L daily, sodium restriction to less than 2000 mg daily, and the importance of daily weights.   - 2. Syncope--> orthostatic hypotension - No further. Plan as above.  3. Suspected Sleep Apnea - Needs sleep study. Cant afford currently. Waiting to meet his deductible towards the end of the year.  4. Sinus Tach with 1st degree heart block - low dose coreg as above.  5. Morbid Obesity: Body mass index is 42.91 kg/m.   - Discussed weight loss and portion control. 6. Gout - Acute flare resolved. Needs to follow up with PCP for further.  7. N/V : GI upset.  - Resolved.   Will have him see Pharm D in 3-4 weeks for med titration and RTC 6-8 weeks with MD.   Graciella Freer, PA-C  9:25 AM   Greater than 50% of the 25 minute visit was spent in counseling/coordination of care regarding disease state education, medication reconciliation, and salt/fluid restriction.

## 2017-03-30 NOTE — Patient Instructions (Signed)
START Coreg 3.125 mg, one tab twice a day START Spironolactone 12.5mg , one half tab daily at bedtime CHANGE Torsemide to 20 mg, one tab as needed  Your physician recommends that you schedule a follow-up appointment in: 3 weeks with the CHF pharmacist Cicero Duck, PharmD  Your physician recommends that you schedule a follow-up appointment in: 6-7 weeks with Dr [name]   Do the following things EVERYDAY: 1) Weigh yourself in the morning before breakfast. Write it down and keep it in a log. 2) Take your medicines as prescribed 3) Eat low salt foods-Limit salt (sodium) to 2000 mg per day.  4) Stay as active as you can everyday 5) Limit all fluids for the day to less than 2 liters

## 2017-03-30 NOTE — Progress Notes (Signed)
Paramedicine Encounter    Patient ID: Bradley Powell, male    DOB: 08-May-1981, 36 y.o.   MRN: [id number]   Patient Care Team: [name], MD as PCP - General (Internal Medicine)  Patient Active Problem List   Diagnosis Date Noted  . Gout 03/30/2017  . Suspected sleep apnea 03/01/2017  . Edema 02/24/2017  . Testicular swelling 02/24/2017  . CHF (congestive heart failure) (HCC) 02/24/2017  . Overweight 02/24/2017  . Costochondritis 02/24/2017  . Sinus tachycardia 02/24/2017  . First degree AV block 02/24/2017  . Scrotal edema 02/24/2017  . Morbid obesity (HCC) 02/24/2017    Current Outpatient Prescriptions:  .  acetaminophen (TYLENOL) 500 MG tablet, Take 500 mg by mouth every 6 (six) hours as needed (pain)., Disp: , Rfl:  .  carvedilol (COREG) 3.125 MG tablet, Take 1 tablet (3.125 mg total) by mouth 2 (two) times daily with a meal., Disp: 60 tablet, Rfl: 3 .  colchicine 0.6 MG tablet, Take 1 tablet (0.6 mg total) by mouth daily., Disp: 30 tablet, Rfl: 6 .  spironolactone (ALDACTONE) 25 MG tablet, Take 0.5 tablets (12.5 mg total) by mouth at bedtime., Disp: 30 tablet, Rfl: 3 .  torsemide (DEMADEX) 20 MG tablet, Take 1 tablet (20 mg total) by mouth as needed., Disp: 30 tablet, Rfl: 3 No Known Allergies   Social History   Social History  . Marital status: Single    Spouse name: N/A  . Number of children: N/A  . Years of education: N/A   Occupational History  . Not on file.   Social History Main Topics  . Smoking status: Never Smoker  . Smokeless tobacco: Never Used  . Alcohol use Yes     Comment: rarely  . Drug use: No  . Sexual activity: Not on file   Other Topics Concern  . Not on file   Social History Narrative  . No narrative on file    Physical Exam  Constitutional: He is oriented to person, place, and time.  Pulmonary/Chest: Effort normal and breath sounds normal.  Abdominal: Soft.  Musculoskeletal: He exhibits edema.  Edema to legs    Neurological: He is oriented to person, place, and time.  Skin: Skin is warm and dry.        Future Appointments Date Time Provider Department Center  04/24/2017 2:45 PM [facility] None  05/15/2017 3:00 PM Bensimhon, Bevelyn Buckles, MD [facility] None  10/16/2017 8:00 PM [facility]   BP (!) 124/98   Pulse (!) 102   Resp 16   Wt 286 lb (129.7 kg)   SpO2 98%   BMI 42.85 kg/m  Weight yesterday-285  Pt reports that he went to doc this am, I was not able to meet him there due to a meeting this morning at same time. It was reported his weight is up 11lbs from last visit. 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  Pt began telling me he started noticing symptoms of increased swelling about 1-2 mths ago, so sounds like this is a new diagnosis.  He does work til around [employer], he reports he eats late.  He reports when he was d/c from the hosp when he began taking his pills that was d/c with he reports he got very dizzy and fell twice. He contacted the clinic to let them know. His dad was able to try to get b/p on him, but it was so low it would not register on  the machine. Sounds like they have been adjusting his meds since he was d/c from the b/p dropping and then n/v after trying with entresto. He reports it has been 2wks since he has taken the meds. He goes to [facility] is waiting to get his meds at 3 when they will be ready. However he still has the meds from before and wasn't aware that he could still use it. We filled up pill box together. He c/o tingling down his thighs to his knees when he elevates his legs but then it resolves once he gets moving. He states while he is laying down on his left side it is worse. He is going to try to work out to lose the weight. He is aware of the low sodium diet and fluid restriction. He denies any increased sob, no dizziness. Pt is able to drive self to appointments. He reports he has Media pl[employer].   ACTION: Home  visit completed Next visit planned for next week  Kerry Hough, EMT-Paramedic 03/30/17

## 2017-04-05 ENCOUNTER — Other Ambulatory Visit (HOSPITAL_COMMUNITY): Payer: Self-pay

## 2017-04-05 NOTE — Progress Notes (Signed)
Paramedicine Encounter    Patient ID: Bradley Powell, male    DOB: Jun 29, 1981, 36 y.o.   MRN: [id number]   Patient Care Team: [name], MD as PCP - General (Internal Medicine)  Patient Active Problem List   Diagnosis Date Noted  . Gout 03/30/2017  . Suspected sleep apnea 03/01/2017  . Edema 02/24/2017  . Testicular swelling 02/24/2017  . CHF (congestive heart failure) (HCC) 02/24/2017  . Overweight 02/24/2017  . Costochondritis 02/24/2017  . Sinus tachycardia 02/24/2017  . First degree AV block 02/24/2017  . Scrotal edema 02/24/2017  . Morbid obesity (HCC) 02/24/2017    Current Outpatient Prescriptions:  .  acetaminophen (TYLENOL) 500 MG tablet, Take 500 mg by mouth every 6 (six) hours as needed (pain)., Disp: , Rfl:  .  carvedilol (COREG) 3.125 MG tablet, Take 1 tablet (3.125 mg total) by mouth 2 (two) times daily with a meal., Disp: 60 tablet, Rfl: 3 .  colchicine 0.6 MG tablet, Take 1 tablet (0.6 mg total) by mouth daily., Disp: 30 tablet, Rfl: 6 .  spironolactone (ALDACTONE) 25 MG tablet, Take 0.5 tablets (12.5 mg total) by mouth at bedtime., Disp: 30 tablet, Rfl: 3 .  torsemide (DEMADEX) 20 MG tablet, Take 1 tablet (20 mg total) by mouth as needed., Disp: 30 tablet, Rfl: 3 No Known Allergies   Social History   Social History  . Marital status: Single    Spouse name: N/A  . Number of children: N/A  . Years of education: N/A   Occupational History  . Not on file.   Social History Main Topics  . Smoking status: Never Smoker  . Smokeless tobacco: Never Used  . Alcohol use Yes     Comment: rarely  . Drug use: No  . Sexual activity: Not on file   Other Topics Concern  . Not on file   Social History Narrative  . No narrative on file    Physical Exam      Future Appointments Date Time Provider Department Center  04/24/2017 2:45 PM [facility] None  05/15/2017 3:00 PM Bensimhon, Bevelyn Buckles, MD [facility] None  10/16/2017 8:00 PM [facility]   BP 110/78   Pulse 96   Resp 16   Wt 274 lb (124.3 kg)   SpO2 98%   BMI 41.06 kg/m  Weight yesterday-275 Last visit weight-286  Pt states he is having some pains to his left ankle, he states this feels like his previous episodes of gout flare before he really begins, he took colchicine and it has eased off.  He also reports that his heart was racing yesterday evening, when they checked it with his apple watch it was up to 115. He reports slow day for him yesterday and this occurred while he was cooking dinner.  No missed doses of his meds since last visit.  He denies any dizziness, no sob. He feels like at night time its been harder for him to fall asleep. I watched him fill up his own pill box. He did it correctly. He is able to sleep well at night time with one pillow. He is trying with the low sodium, he states the limiting his fluids is difficult for him. No swelling noted.   ACTION: Home visit completed  Kerry Hough, EMT-Paramedic 04/05/17

## 2017-04-12 ENCOUNTER — Other Ambulatory Visit (HOSPITAL_COMMUNITY): Payer: Self-pay

## 2017-04-12 NOTE — Progress Notes (Signed)
Paramedicine Encounter    Patient ID: Bradley Powell, male    DOB: 1981-02-11, 36 y.o.   MRN: [id number]   Patient Care Team: Seward Carol, MD as PCP - General (Internal Medicine)  Patient Active Problem List   Diagnosis Date Noted  . Gout 03/30/2017  . Suspected sleep apnea 03/01/2017  . Edema 02/24/2017  . Testicular swelling 02/24/2017  . CHF (congestive heart failure) ([place]) 02/24/2017  . Overweight 02/24/2017  . Costochondritis 02/24/2017  . Sinus tachycardia 02/24/2017  . First degree AV block 02/24/2017  . Scrotal edema 02/24/2017  . Morbid obesity ([place]) 02/24/2017    Current Outpatient Prescriptions:  .  acetaminophen (TYLENOL) 500 MG tablet, Take 500 mg by mouth every 6 (six) hours as needed (pain)., Disp: , Rfl:  .  carvedilol (COREG) 3.125 MG tablet, Take 1 tablet (3.125 mg total) by mouth 2 (two) times daily with a meal., Disp: 60 tablet, Rfl: 3 .  colchicine 0.6 MG tablet, Take 1 tablet (0.6 mg total) by mouth daily., Disp: 30 tablet, Rfl: 6 .  spironolactone (ALDACTONE) 25 MG tablet, Take 0.5 tablets (12.5 mg total) by mouth at bedtime., Disp: 30 tablet, Rfl: 3 .  torsemide (DEMADEX) 20 MG tablet, Take 1 tablet (20 mg total) by mouth as needed., Disp: 30 tablet, Rfl: 3 No Known Allergies   Social History   Social History  . Marital status: Single    Spouse name: N/A  . Number of children: N/A  . Years of education: N/A   Occupational History  . Not on file.   Social History Main Topics  . Smoking status: Never Smoker  . Smokeless tobacco: Never Used  . Alcohol use Yes     Comment: rarely  . Drug use: No  . Sexual activity: Not on file   Other Topics Concern  . Not on file   Social History Narrative  . No narrative on file    Physical Exam      Future Appointments Date Time Provider N[place]  04/24/2017 2:45 PM [facility] None  05/15/2017 3:00 PM Bensimhon, Shaune Pascal, MD [facility] None  10/16/2017 8:00 PM [facility]   BP 126/88   Pulse (!) 102   Resp 18   Wt 273 lb (123.8 kg)   SpO2 98%   BMI 40.91 kg/m  Weight yesterday-271 Last visit weight-274   Met pt at place of employment due to long hours at work this week, he reports that he has been getting dizzy while bending over and standing back up, I advised him to try to limit those times where he is doing that if possible-he is having issues with gout and I advised him to see his PCP for that. No swelling noted today. He is able to work without any issues. He did not bring his meds with him today-but feels like has them all right. No dizziness, no sob. No h/a. Seems to be doing well. Eating and drinking ok. Weight doing well.     ACTION: Home visit completed  Marylouise Stacks, EMT-Paramedic 04/12/17

## 2017-04-24 ENCOUNTER — Ambulatory Visit (HOSPITAL_COMMUNITY)
Admission: RE | Admit: 2017-04-24 | Discharge: 2017-04-24 | Disposition: A | Discharge status: Home / Self Care | Payer: Self-pay | Source: Ambulatory Visit | Attending: Internal Medicine | Admitting: Internal Medicine

## 2017-04-24 ENCOUNTER — Encounter (HOSPITAL_COMMUNITY): Payer: Self-pay

## 2017-04-24 ENCOUNTER — Other Ambulatory Visit (HOSPITAL_COMMUNITY): Payer: Self-pay

## 2017-04-24 VITALS — BP 108/86 | HR 86 | Wt 282.2 lb

## 2017-04-24 DIAGNOSIS — R55 Syncope and collapse: Secondary | ICD-10-CM | POA: Insufficient documentation

## 2017-04-24 DIAGNOSIS — M109 Gout, unspecified: Secondary | ICD-10-CM | POA: Insufficient documentation

## 2017-04-24 DIAGNOSIS — I5042 Chronic combined systolic (congestive) and diastolic (congestive) heart failure: Secondary | ICD-10-CM | POA: Insufficient documentation

## 2017-04-24 DIAGNOSIS — Z6841 Body Mass Index (BMI) 40.0 and over, adult: Secondary | ICD-10-CM | POA: Insufficient documentation

## 2017-04-24 DIAGNOSIS — R112 Nausea with vomiting, unspecified: Secondary | ICD-10-CM | POA: Insufficient documentation

## 2017-04-24 DIAGNOSIS — I440 Atrioventricular block, first degree: Secondary | ICD-10-CM | POA: Insufficient documentation

## 2017-04-24 DIAGNOSIS — I5022 Chronic systolic (congestive) heart failure: Secondary | ICD-10-CM

## 2017-04-24 DIAGNOSIS — I110 Hypertensive heart disease with heart failure: Secondary | ICD-10-CM | POA: Insufficient documentation

## 2017-04-24 LAB — BRAIN NATRIURETIC PEPTIDE: B Natriuretic Peptide: 336.4 pg/mL — ABNORMAL HIGH (ref 0.0–100.0)

## 2017-04-24 LAB — BASIC METABOLIC PANEL
Anion gap: 9 (ref 5–15)
BUN: 17 mg/dL (ref 6–20)
CALCIUM: 9.5 mg/dL (ref 8.9–10.3)
CO2: 27 mmol/L (ref 22–32)
CREATININE: 1.28 mg/dL — AB (ref 0.61–1.24)
Chloride: 103 mmol/L (ref 101–111)
GFR calc non Af Amer: >60 mL/min (ref >60)
GLUCOSE: 94 mg/dL (ref 65–99)
Potassium: 3.7 mmol/L (ref 3.5–5.1)
Sodium: 139 mmol/L (ref 135–145)

## 2017-04-24 MED ORDER — CARVEDILOL 3.125 MG PO TABS: 6.2500 mg | ORAL_TABLET | Freq: Two times a day (BID) | ORAL | 3 refills | Status: DC

## 2017-04-24 NOTE — Progress Notes (Signed)
HF MD: Bensimhon  HPI:  Bradley Powell is a 36 y.o. african [place] male with a history of combined systolic/diastolic heart failure, Sinus Tach 1st degree heart block, obesity, htn, and gout.   Recent admission 4/13 through 03/07/17 with marked volume overload. Diuresed with IV lasix tid + metolazone.Had RHC/LHC as noted below. Overall diuresed 45 pounds. On the day of discharge carvedilol was restarted. Discharge weight was 281 pounds.   He presents today for pharmacist-led heart failure medication optimization appointment. At last appointment on 03/30/17 with [name], patient was started on coreg 3.125 mg BID and spironolactone 12.5 mg daily. Patient previously had orthostatic hypotension that resulted in syncope, therefore entresto was stopped - will not rechallenge at this time.   Today, patient presents in good spirits ambulating without assistance. Paramedicine present for appointment. Patient denies orthopnea, dizziness, SOB, or any further episodes of near-syncope. Patient with 1+ pitting edema on exam. Endorses intermittent "racing" HR but no lightheadedness. Still experiencing minor sx of orthostatic hypotension. Paramedicine takes BP at home. Per patient, his personal cuff (brachial artery) is too small. Patient is ~10 lbs up from dry weight - he attributes to increased water intake over the weekend. 24 hr diet recall corroborates adherence to sodium-restricted diet, using Mrs. Dash to season foods. Struggles to adhere to 2L fluid restrictions.   . Shortness of breath/dyspnea on exertion? yes  . Orthopnea/PND? no . Edema? Yes - 1+ pitting edema in shins . Lightheadedness/dizziness? Yes - has not fallen or passed out though . Daily weights at home? yes . Blood pressure/heart rate monitoring at home? Yes - paramedicine takes BP at home - last paramedicine visits BP 110-120s/70s-80s . Following low-sodium/fluid-restricted diet? Yes to diet, struggles with fluid restrictions   HF  Medications: Carvedilol 3.125 BID Spiro 12.5 mg daily Torsemide 20 mg PRN - has been taking once daily  Has the patient been experiencing any side effects to the medications prescribed?  no  Does the patient have any problems obtaining medications due to transportation or finances?   No - no Rx insurance currently   Understanding of regimen: good Understanding of indications: good Potential of compliance: good Patient understands to avoid NSAIDs. Patient understands to avoid decongestants.    Pertinent Lab Values: . 04/24/2017  Serum creatinine 1.28 (baseline ~1.1), CO2 27, Potassium 3.7, Sodium 139, BNP 336.4   Vital Signs: . Weight: 282.2 lbs (dry weight: 275 per patient) . Blood pressure:  112/82 mmHg sitting, 108/68 mmHg standing . Heart rate: 86 bpm  . BMI too high for accurate Vest reading  Assessment: 1. Chronicsystolic CHF (EF ECHO 02/22/2017 EF 40-45% Grade II DD. Peak PA pressure 46 mmhg), due to NICM likely with sleep apnea component. NYHA class II symptoms.  - Volume status slightly elevated today on exam   - Increase carvedilol to 6.25 mg PO BID  - Increase torsemide to 20 mg BID for two days, then return to 20 mg daily  - Continue spironolactone 12.5 mg daily - Basic disease state pathophysiology, medication indication, mechanism and side effects reviewed at length with patient and he verbalized understanding 2. Recent syncope> orthostatic hypotension - negative for orthostatics today, no further syncope, will monitor 3. Suspected Sleep Apnea - Needs sleep study. Cant afford currently. Waiting to meet his deductible towards the end of the year.  4. Sinus Tach with 1st degree heart block - Increasing carvedilol 6.25 mg BID  5. Morbid Obesity: Body mass index is 42.91 kg/m.   - Discussed weight loss  and portion control. 6. Gout - Last took colchicine ~1 week ago as he felt flare coming on. Needs to follow up with PCP for further.  7. N/V : GI upset.  -  Resolved.   Plan: 1) Medication changes: Based on clinical presentation, vital signs and recent labs will increase carvedilol 6.25 mg BID, increase torsemide to 20 mg BID x 2 days, then return to 20 mg daily.  2) Labs: BMet and BNP today 3) Follow-up: with Dr. [name] 05/15/17, pt will call with problems after carvedilol and torsemide increase  Devota Pace, PharmD, PGY1 Pharmacy Resident 04/24/2017 4:13 PM  Agree with above.  [name], PharmD, BCPS, CPP Clinical Pharmacist Pager: [phone] Phone: [phone] 04/24/2017 3:25 PM   Agree with above.  Arvilla Meres, MD  6:43 PM

## 2017-04-24 NOTE — Progress Notes (Signed)
Paramedicine Encounter   Patient ID: Bradley Powell , male,   DOB: 1981/05/04,35 y.o.,  MRN: [id number]   Met patient in clinic today with provider.  Time spent with patient  60 min   Met pt with pharmacist visit. Weight at home-278 Weight @ clinic-282 Last visit @ home-273 Weight up 5lbs from last visit.he advised that he has been drinking more than he should. He is to double his torsemide for 2 days and his carvedilol will be increased also.   Marylouise Stacks, EMT-Paramedic 04/24/2017   ACTION: Home visit completed

## 2017-04-24 NOTE — Patient Instructions (Addendum)
It was great to see you today!  Please take TORSEMIDE 20 mg (1 tablet) TWICE DAILY FOR 2 DAYS, then resume 20 mg (1 tablet) ONCE DAILY.  Please INCREASE carvedilol to 6.25 mg (2 tablets) TWICE DAILY.  Labs today. We will call you with any abnormalities.  Please keep your appointment with Dr. [name] on 05/15/17.

## 2017-05-04 ENCOUNTER — Encounter (HOSPITAL_BASED_OUTPATIENT_CLINIC_OR_DEPARTMENT_OTHER): Payer: Self-pay

## 2017-05-15 ENCOUNTER — Ambulatory Visit (HOSPITAL_COMMUNITY)
Admission: RE | Admit: 2017-05-15 | Discharge: 2017-05-15 | Disposition: A | Discharge status: Home / Self Care | Payer: No Typology Code available for payment source | Source: Ambulatory Visit | Attending: Internal Medicine | Admitting: Internal Medicine

## 2017-05-15 ENCOUNTER — Encounter (HOSPITAL_COMMUNITY): Payer: Self-pay | Admitting: Internal Medicine

## 2017-05-15 ENCOUNTER — Other Ambulatory Visit (HOSPITAL_COMMUNITY): Payer: Self-pay

## 2017-05-15 VITALS — BP 104/78 | HR 92 | Wt 281.0 lb

## 2017-05-15 DIAGNOSIS — Z8249 Family history of ischemic heart disease and other diseases of the circulatory system: Secondary | ICD-10-CM | POA: Diagnosis not present

## 2017-05-15 DIAGNOSIS — I110 Hypertensive heart disease with heart failure: Secondary | ICD-10-CM | POA: Diagnosis not present

## 2017-05-15 DIAGNOSIS — I5040 Unspecified combined systolic (congestive) and diastolic (congestive) heart failure: Secondary | ICD-10-CM | POA: Insufficient documentation

## 2017-05-15 DIAGNOSIS — I493 Ventricular premature depolarization: Secondary | ICD-10-CM | POA: Diagnosis not present

## 2017-05-15 DIAGNOSIS — Z6841 Body Mass Index (BMI) 40.0 and over, adult: Secondary | ICD-10-CM | POA: Diagnosis not present

## 2017-05-15 DIAGNOSIS — Z8349 Family history of other endocrine, nutritional and metabolic diseases: Secondary | ICD-10-CM | POA: Insufficient documentation

## 2017-05-15 DIAGNOSIS — I5022 Chronic systolic (congestive) heart failure: Secondary | ICD-10-CM

## 2017-05-15 DIAGNOSIS — M109 Gout, unspecified: Secondary | ICD-10-CM | POA: Diagnosis not present

## 2017-05-15 DIAGNOSIS — Z823 Family history of stroke: Secondary | ICD-10-CM | POA: Insufficient documentation

## 2017-05-15 DIAGNOSIS — Z79899 Other long term (current) drug therapy: Secondary | ICD-10-CM | POA: Insufficient documentation

## 2017-05-15 MED ORDER — LOSARTAN POTASSIUM 25 MG PO TABS: 12.5000 mg | ORAL_TABLET | Freq: Every day | ORAL | 3 refills | Status: DC

## 2017-05-15 NOTE — Patient Instructions (Signed)
Start Losartan 12.5 mg (1/2 tab) daily at bedtime  Your physician has recommended that you wear a holter monitor. Holter monitors are medical devices that record the heart's electrical activity. Doctors most often use these monitors to diagnose arrhythmias. Arrhythmias are problems with the speed or rhythm of the heartbeat. The monitor is a small, portable device. You can wear one while you do your normal daily activities. This is usually used to diagnose what is causing palpitations/syncope (passing out).  Your physician recommends that you schedule a follow-up appointment in: 8 weeks with echocardiogram

## 2017-05-15 NOTE — Progress Notes (Signed)
Advanced Heart Failure Clinic Note   PCP: Dr Nehemiah Settle  Primary HF Cardiologist: Dr [name]  HPI: Bradley Powell is a 36 y.o. male with a history of combined systolic/diastolic heart failure, Sinus Tach 1st degree heart block, obesity, htn, and gout.   Admitted 4/13 through 03/07/17 with marked volume overload. Diuresed with IV lasix tid + metolazone.Had RHC/LHC as noted below. Overall diuresed 45 pounds. On the day of discharge carvedilol was restarted. Discharge weight was 281 pounds.   Today he returns for HF follow up. Overall feeling better. Mild dyspnea with steps. No long having nausea/vomiting. Weight at home 275 pounds. Tries to follow low salt diet. Drinking > 2 liters. Taking all medications. He is off for summer. He is a Financial planner.   RHC/LHC 03/01/2017  AO = 109/86 (97) LV =  101/28 RA =  29 RV = 50/22 PA = 55/24 (38) PCW = 26 Fick cardiac output/index = 6.4/2.6 Thermal CO/CI = 4.9/2.0 PVR = 2.5 WU FA sat = 98% PA sat = 69%, 71% SVC sat = 82%, 82% IVC sat = 66% Assessment: 1. Normal coronary arteries  2. Mild to moderate pulmonary HTN with normal PVR 3. Biventricular volume overload with R > L heart failure 4. Moderately decreased CO by thermodilution  5. 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 No intra-cardiac shunt  Review of systems complete and found to be negative unless listed in HPI.    SH:  Social History   Social History  . Marital status: Single    Spouse name: N/A  . Number of children: N/A  . Years of education: N/A   Occupational History  . Not on file.   Social History Main Topics  . Smoking status: Never Smoker  . Smokeless tobacco: Never Used  . Alcohol use Yes     Comment: rarely  . Drug use: No  . Sexual activity: Not on file   Other Topics Concern  . Not on file   Social History Narrative  . No narrative on file    FH:  Family  History  Problem Relation Age of Onset  . CVA Mother        pacemaker  . Multiple sclerosis Mother   . Seizures Mother   . Hypertension Father   . Gout Father     Past Medical History:  Diagnosis Date  . History of esophagogastroduodenoscopy (EGD)     Current Outpatient Prescriptions  Medication Sig Dispense Refill  . acetaminophen (TYLENOL) 500 MG tablet Take 500 mg by mouth every 6 (six) hours as needed (pain).    . carvedilol (COREG) 3.125 MG tablet Take 2 tablets (6.25 mg total) by mouth 2 (two) times daily with a meal. 120 tablet 3  . colchicine 0.6 MG tablet Take 0.6 mg by mouth daily as needed (gout flare).     Marland Kitchen spironolactone (ALDACTONE) 25 MG tablet Take 0.5 tablets (12.5 mg total) by mouth at bedtime. 30 tablet 3  . torsemide (DEMADEX) 20 MG tablet Take 20 mg by mouth daily.     No current facility-administered medications for this encounter.    Vitals:   05/15/17 1513  BP: 104/78  Pulse: 92  SpO2: 100%  Weight: 281 lb (127.5 kg)   Wt Readings from Last 3 Encounters:  05/15/17 281 lb (127.5 kg)  04/24/17 282 lb 3.2 oz (128 kg)  04/12/17 273 lb (123.8 kg)   PHYSICAL  EXAM: General: Well appearing. NAD. Used walker to get in due to gout flare.  HEENT: Normal Neck: supple. JVD flat. Carotids 2+ bilat; no bruits. No thyromegaly or nodule noted. Cor: PMI nondisplaced. RRR occasional ectopy, No M/G/R noted Lungs: CTAB, normal effort. Abdomen: soft, non-tender, distended, no HSM. No bruits or masses. +BS  Extremities: no cyanosis, clubbing, rash, R and LLE no edema.  Neuro: Alert & orientedx3, cranial nerves grossly intact. moves all 4 extremities w/o difficulty. Affect flat   ASSESSMENT & PLAN: 1. Combined Systolic/Diastolic Heart Failure- ECHO 02/22/2017 EF 40-45% Grade II DD. Peak PA pressure 46 mmhg.  NYHA II- Volume status stable. Continue torsemide 20 mg daily.  - - Will not re-challenge on Entresto with EF > 40 % and hypotension/orthostasis/nausea.    Continue carvedilol 6.25 mg twice a day Continue spiro 12.5 mg daily Add 12.5 mg losartan at bed time.  2. Syncope--> resolved.   3. Suspected Sleep Apnea -Sleep study set up in August.  4. Sinus Tach with 1st degree heart block- resolved. Heart rate improved.  -5. Morbid Obesity: Body mass index is 42.1 kg/m.   - Discussed weight loss and portion control. 6. Gout- no problems.  7. N/V-- resolved.  8. PVCs - ? If cardiomyopathy PVC-related. Will place 24-hour monitor    [name], NP  3:17 PM  Patient seen and examined with [name], NP. We discussed all aspects of the encounter. I agree with the assessment and plan as stated above.   Doing very well. Volume status much improved. EF 40-45% on recent echo. Will add losartan 12.5  Etiology of CM remains unclear. Has PSG pending. Will place 24-hour monitor to quantify PVC burden. Can consider cMRI down the road.  Arvilla Meres, MD  3:49 PM

## 2017-05-15 NOTE — Progress Notes (Signed)
Paramedicine Encounter   Patient ID: Bradley Powell , male,   DOB: Feb 16, 1981,35 y.o.,  MRN: [id number]   Met patient in clinic today with provider.  Time spent with patient 4mn  Weight today-275 @ home Yesterday-275 Last visit-278 @ clinic-281  Pt reports that he is doing well, however his weight went up due to him increasing his fluid intake,  No dizziness with the increase of medication. Increased sob with exertion but he isnt sure if its due to him being out of shape vs chf.  Sleep study at the end of aug.  Adding losartan 12.582mat bedtime.  Clinic will request 24 hr heart monitor.  Will see pt in 2wks unless he needs something between now and then. I feel like pt is doing well and has the resources needed and will be able to graduate the program soon.   KaMarylouise StacksEMT-Paramedic 05/15/2017   ACTION: Home visit completed Next visit planned for 2wks

## 2017-05-23 ENCOUNTER — Encounter: Payer: Self-pay | Admitting: Cardiology

## 2017-05-24 ENCOUNTER — Ambulatory Visit (INDEPENDENT_AMBULATORY_CARE_PROVIDER_SITE_OTHER): Payer: No Typology Code available for payment source

## 2017-05-24 DIAGNOSIS — I493 Ventricular premature depolarization: Secondary | ICD-10-CM

## 2017-06-26 ENCOUNTER — Telehealth (HOSPITAL_COMMUNITY): Payer: Self-pay

## 2017-06-26 ENCOUNTER — Telehealth (HOSPITAL_COMMUNITY): Payer: Self-pay | Admitting: Surgery

## 2017-06-26 NOTE — Telephone Encounter (Signed)
Patient will be discharged at this time from [facility] program due to successful completion.

## 2017-06-26 NOTE — Telephone Encounter (Signed)
Spoke with pt this morning in reference to the need of another visit, he is doing very well, independent, still works and has all the resources he needs at this time. Pt has good support at home from parents. He does well with meds, diet and exercise. He no longer needs paramedicine, I advised him down the road if he feels like he needs it again then to contact myself or the heart clinic. He is agreeable to this.

## 2017-07-06 ENCOUNTER — Ambulatory Visit (HOSPITAL_BASED_OUTPATIENT_CLINIC_OR_DEPARTMENT_OTHER): Payer: Self-pay | Attending: Adult Health | Admitting: Cardiology

## 2017-07-06 VITALS — Ht 68.0 in | Wt 280.0 lb

## 2017-07-06 DIAGNOSIS — R29818 Other symptoms and signs involving the nervous system: Secondary | ICD-10-CM

## 2017-07-06 DIAGNOSIS — R0683 Snoring: Secondary | ICD-10-CM | POA: Insufficient documentation

## 2017-07-06 DIAGNOSIS — G4733 Obstructive sleep apnea (adult) (pediatric): Secondary | ICD-10-CM

## 2017-07-06 DIAGNOSIS — I441 Atrioventricular block, second degree: Secondary | ICD-10-CM | POA: Insufficient documentation

## 2017-07-06 DIAGNOSIS — I5022 Chronic systolic (congestive) heart failure: Secondary | ICD-10-CM

## 2017-07-06 DIAGNOSIS — E669 Obesity, unspecified: Secondary | ICD-10-CM | POA: Insufficient documentation

## 2017-07-06 DIAGNOSIS — Z6841 Body Mass Index (BMI) 40.0 and over, adult: Secondary | ICD-10-CM | POA: Insufficient documentation

## 2017-07-06 DIAGNOSIS — I509 Heart failure, unspecified: Secondary | ICD-10-CM | POA: Insufficient documentation

## 2017-07-07 NOTE — Procedures (Signed)
   Patient Name: Bradley Powell, Bradley Powell Date: 07/06/2017   Gender: Male  D.O.B: [birth year]  Age (years): 68  Referring Provider: [name] NP  Height (inches): 68  Interpreting Physician: Armanda Magic MD, ABSM  Weight (lbs): 280  RPSGT: Shelah Lewandowsky   BMI: 43  MRN: [id number]  Neck Size: 18.00    CLINICAL INFORMATION  Sleep Study Type: NPSG  Indication for sleep study: Congestive Heart Failure, Obesity, Snoring, Witnessed Apneas  Epworth Sleepiness Score: 5  SLEEP STUDY TECHNIQUE  As per the AASM Manual for the Scoring of Sleep and Associated Events v2.3 (April 2016) with a hypopnea requiring 4% desaturations.  The channels recorded and monitored were frontal, central and occipital EEG, electrooculogram (EOG), submentalis EMG (chin), nasal and oral airflow, thoracic and abdominal wall motion, anterior tibialis EMG, snore microphone, electrocardiogram, and pulse oximetry.  MEDICATIONS  Medications self-administered by patient taken the night of the study : CARVEDILOL, LOSARTAN, SPIRONOLACTONE  SLEEP ARCHITECTURE  The study was initiated at 11:19:06 PM and ended at 5:20:03 AM.  Sleep onset time was 44.2 minutes and the sleep efficiency was 64.6%. The total sleep time was 233.3 minutes.  Stage REM latency was 236.5 minutes.  The patient spent 36.65% of the night in stage N1 sleep, 48.99% in stage N2 sleep, 0.00% in stage N3 and 14.36% in REM.  Alpha intrusion was absent.  Supine sleep was 79.42%.  RESPIRATORY PARAMETERS  The overall apnea/hypopnea index (AHI) was 26.7 per hour. There were 17 total apneas, including 17 obstructive, 0 central and 0 mixed apneas. There were 87 hypopneas and 55 RERAs.  The AHI during Stage REM sleep was 19.7 per hour. AHI while supine was 31.1 per hour.  The mean oxygen saturation was 94.91%. The minimum SpO2 during sleep was 83.00%.  Moderate snoring was noted during this study. CARDIAC DATA  The 2 lead EKG demonstrated sinus rhythm. The  mean heart rate was 84.80 beats per minute. Other EKG findings include: intermittent heart block.  LEG MOVEMENT DATA  The total PLMS were 19 with a resulting PLMS index of 4.89. Associated arousal with leg movement index was 0.0 . IMPRESSIONS  - Moderate obstructive sleep apnea occurred during this study (AHI = 26.7/h).  - No significant central sleep apnea occurred during this study (CAI = 0.0/h).  - Mild oxygen desaturation was noted during this study (Min O2 = 83.00%).  - The patient snored with Moderate snoring volume.  - EKG findings include intermittent heart block.  - Clinically significant periodic limb movements did not occur during sleep. No significant associated arousals. DIAGNOSIS  - Obstructive Sleep Apnea (327.23 [G47.33 ICD-10])  - Intermittent heart block 2nd degree RECOMMENDATIONS  - Therapeutic CPAP titration to determine optimal pressure required to alleviate sleep disordered breathing. - Positional therapy avoiding supine position during sleep. - Avoid alcohol, sedatives and other CNS depressants that may worsen sleep apnea and disrupt normal sleep architecture. - Sleep hygiene should be reviewed to assess factors that may improve sleep quality. - Weight management and regular exercise should be initiated or continued if appropriate.   Armanda Magic Diplomate, American Board of Sleep Medicine  ELECTRONICALLY SIGNED ON:  07/07/2017, 2:45 PM [facility] PH: [phone]   FX: [phone] ACCREDITED BY THE AMERICAN ACADEMY OF SLEEP MEDICINE

## 2017-07-12 ENCOUNTER — Telehealth: Payer: Self-pay | Admitting: *Deleted

## 2017-07-12 NOTE — Telephone Encounter (Signed)
-----   Message from Traci R Turner, MD sent at 07/07/2017  2:47 PM EDT ----- Please let patient know that they have sleep apnea and recommend CPAP titration. Please set up titration in the sleep lab. 

## 2017-07-12 NOTE — Telephone Encounter (Signed)
LMTCB

## 2017-07-13 NOTE — Telephone Encounter (Deleted)
-----   Message from Quintella Reichert, MD sent at 07/07/2017  2:47 PM EDT ----- Please let patient know that they have sleep apnea and recommend CPAP titration. Please set up titration in the sleep lab.

## 2017-07-13 NOTE — Telephone Encounter (Signed)
Informed patient of sleep study results and patient understanding was verbalized. Patient has declined having his CPAP titration scheduled until he sees his cardiologist (Dr [name]) on Wednesday 07/19/2017. Patient states he was sleeping good until the technician woke him up and asked him to sleep on his back. Patient states he normally sleeps on his side so he had a hard time getting back to sleep on his back.

## 2017-07-19 ENCOUNTER — Ambulatory Visit (HOSPITAL_COMMUNITY)
Admission: RE | Admit: 2017-07-19 | Discharge: 2017-07-19 | Disposition: A | Discharge status: Home / Self Care | Payer: Self-pay | Source: Ambulatory Visit | Attending: Internal Medicine | Admitting: Internal Medicine

## 2017-07-19 ENCOUNTER — Encounter (HOSPITAL_COMMUNITY): Payer: Self-pay | Admitting: Internal Medicine

## 2017-07-19 ENCOUNTER — Ambulatory Visit (HOSPITAL_BASED_OUTPATIENT_CLINIC_OR_DEPARTMENT_OTHER)
Admission: RE | Admit: 2017-07-19 | Discharge: 2017-07-19 | Disposition: A | Discharge status: Home / Self Care | Payer: Self-pay | Source: Ambulatory Visit | Attending: Internal Medicine | Admitting: Internal Medicine

## 2017-07-19 VITALS — BP 142/82 | HR 86 | Wt 294.2 lb

## 2017-07-19 DIAGNOSIS — I5022 Chronic systolic (congestive) heart failure: Secondary | ICD-10-CM

## 2017-07-19 DIAGNOSIS — I340 Nonrheumatic mitral (valve) insufficiency: Secondary | ICD-10-CM | POA: Insufficient documentation

## 2017-07-19 LAB — BASIC METABOLIC PANEL
ANION GAP: 10 (ref 5–15)
BUN: 14 mg/dL (ref 6–20)
CALCIUM: 9.5 mg/dL (ref 8.9–10.3)
CHLORIDE: 100 mmol/L — AB (ref 101–111)
CO2: 27 mmol/L (ref 22–32)
Creatinine, Ser: 1.2 mg/dL (ref 0.61–1.24)
GFR calc non Af Amer: >60 mL/min (ref >60)
Glucose, Bld: 89 mg/dL (ref 65–99)
POTASSIUM: 3.8 mmol/L (ref 3.5–5.1)
Sodium: 137 mmol/L (ref 135–145)

## 2017-07-19 MED ORDER — LOSARTAN POTASSIUM 25 MG PO TABS: 25.0000 mg | ORAL_TABLET | Freq: Every day | ORAL | 3 refills | Status: DC

## 2017-07-19 MED ORDER — ALLOPURINOL 100 MG PO TABS: 200.0000 mg | ORAL_TABLET | Freq: Every day | ORAL | 3 refills | Status: DC

## 2017-07-19 NOTE — Telephone Encounter (Signed)
Will address at OV today.

## 2017-07-19 NOTE — Progress Notes (Signed)
Advanced Heart Failure Clinic Note   PCP: Dr Nehemiah Settle  Primary HF Cardiologist: Dr [name]  HPI: Bradley Powell is a 36 y.o. male with a history of combined systolic/diastolic heart failure,   obesity, htn, and gout.   Admitted 4/13 through 03/07/17 with marked volume overload. EF 40-45%. Diuresed with IV lasix tid + metolazone.Had RHC/LHC as noted below. Overall diuresed 45 pounds. On the day of discharge carvedilol was restarted. Discharge weight was 281 pounds.   Today he returns for HF follow up. Doing well. Continues to work as a Economist. Walking 1 mile every day. Denies dyspnea. If he tries to run then gets SOB. Minimal edema. No orthopnea or PND. Has gout flare about 2x/month and takes colchcine.   Echo today EF ~30%. Viewed personally.    Overall feeling better. Mild dyspnea with steps. No long having nausea/vomiting. Weight at home 275 pounds. Tries to follow low salt diet. Drinking > 2 liters. Taking all medications. He is off for summer. He is a Financial planner.   Holter 7/18: 6% PVCs  Sleep study AHI 26/hr RHC/LHC 03/01/2017  AO = 109/86 (97) LV =  101/28 RA =  29 RV = 50/22 PA = 55/24 (38) PCW = 26 Fick cardiac output/index = 6.4/2.6 Thermal CO/CI = 4.9/2.0 PVR = 2.5 WU FA sat = 98% PA sat = 69%, 71% SVC sat = 82%, 82% IVC sat = 66% Assessment: 1. Normal coronary arteries  2. Mild to moderate pulmonary HTN with normal PVR 3. Biventricular volume overload with R > L heart failure 4. Moderately decreased CO by thermodilution  5. 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 No intra-cardiac shunt  Review of systems complete and found to be negative unless listed in HPI.    SH:  Social History   Social History  . Marital status: Single    Spouse name: N/A  . Number of children: N/A  . Years of education: N/A   Occupational History  . Not on file.   Social History Main  Topics  . Smoking status: Never Smoker  . Smokeless tobacco: Never Used  . Alcohol use Yes     Comment: rarely  . Drug use: No  . Sexual activity: Not on file   Other Topics Concern  . Not on file   Social History Narrative  . No narrative on file    FH:  Family History  Problem Relation Age of Onset  . CVA Mother        pacemaker  . Multiple sclerosis Mother   . Seizures Mother   . Hypertension Father   . Gout Father     Past Medical History:  Diagnosis Date  . History of esophagogastroduodenoscopy (EGD)     Current Outpatient Prescriptions  Medication Sig Dispense Refill  . acetaminophen (TYLENOL) 500 MG tablet Take 500 mg by mouth every 6 (six) hours as needed (pain).    . carvedilol (COREG) 3.125 MG tablet Take 2 tablets (6.25 mg total) by mouth 2 (two) times daily with a meal. 120 tablet 3  . colchicine 0.6 MG tablet Take 0.6 mg by mouth daily as needed (gout flare).     Marland Kitchen losartan (COZAAR) 25 MG tablet Take 0.5 tablets (12.5 mg total) by mouth at bedtime. 15 tablet 3  . spironolactone (ALDACTONE) 25 MG tablet Take 0.5 tablets (12.5 mg total) by mouth at bedtime. 30 tablet 3  .  torsemide (DEMADEX) 20 MG tablet Take 20 mg by mouth daily.     No current facility-administered medications for this encounter.    Vitals:   07/19/17 1454  BP: (!) 142/82  Pulse: 86  SpO2: 100%  Weight: 294 lb 4 oz (133.5 kg)   Wt Readings from Last 3 Encounters:  07/19/17 294 lb 4 oz (133.5 kg)  07/06/17 280 lb (127 kg)  05/15/17 281 lb (127.5 kg)   PHYSICAL EXAM: General:  Well appearing. No resp difficulty HEENT: normal Neck: supple. no JVD. Carotids 2+ bilat; no bruits. No lymphadenopathy or thryomegaly appreciated. Cor: PMI laterally displaced. Regular rate & rhythm. No rubs, gallops or murmurs. Lungs: clear Abdomen: obese soft, nontender, nondistended. No hepatosplenomegaly. No bruits or masses. Good bowel sounds. Extremities: no cyanosis, clubbing, rash, edema Neuro:  alert & orientedx3, cranial nerves grossly intact. moves all 4 extremities w/o difficulty. Affect pleasant   ASSESSMENT & PLAN: 1. Combined Systolic/Diastolic Heart Failure- ECHO 02/22/2017 EF 40-45% Grade II DD. Peak PA pressure 46 mmhg. Echo today with LVEF ~30% (formnal read 20-25%) - Stable NYHA II  Volume status stable. Continue torsemide 20 mg daily.   - Continue carvedilol 6.25 mg twice a day - Continue spiro 12.5 mg daily - Increase losartan to 25 qhs. Will not rechallenge with Entresto just yet given previous severe orthostasis and nausea - If EF not improving on next echo will need to consider ICD - BMET today 2. Suspected Sleep Apnea -Sleep study positive -Encouraged to initiate therapy with CPAP 3. Morbid Obesity: Body mass index is 44.74 kg/m.   - Discussed weight loss and portion control. 4. Gout - He has frequent flares. Start allopurinol 5. PVCs  - ? If cardiomyopathy PVC-related. 24-hour monitor  7/18 only occasional PVCs   Aubrey Voong, Reuel Boom, MD  3:04 PM

## 2017-07-19 NOTE — Patient Instructions (Signed)
Labs today (will call for abnormal results, otherwise no news is good news)  INCREASE Losartan to 25 mg (1 Tablet) Once daily at bedtime  START taking Allopurinol 200 mg (2 Tablets) Once Daily  Follow up with pharmacist in 1 month  Follow up in 2 Months

## 2017-07-19 NOTE — Progress Notes (Signed)
  Echocardiogram 2D Echocardiogram has been performed.  Leta Jungling M 07/19/2017, 2:51 PM

## 2017-08-16 ENCOUNTER — Ambulatory Visit (HOSPITAL_COMMUNITY)
Admission: RE | Admit: 2017-08-16 | Discharge: 2017-08-16 | Disposition: A | Discharge status: Home / Self Care | Payer: Medicaid Other | Source: Ambulatory Visit | Attending: Internal Medicine | Admitting: Internal Medicine

## 2017-08-16 VITALS — BP 112/82 | HR 94 | Wt 298.6 lb

## 2017-08-16 DIAGNOSIS — I5022 Chronic systolic (congestive) heart failure: Secondary | ICD-10-CM | POA: Insufficient documentation

## 2017-08-16 LAB — BASIC METABOLIC PANEL
ANION GAP: 9 (ref 5–15)
BUN: 12 mg/dL (ref 6–20)
CALCIUM: 9.6 mg/dL (ref 8.9–10.3)
CO2: 28 mmol/L (ref 22–32)
CREATININE: 1.09 mg/dL (ref 0.61–1.24)
Chloride: 102 mmol/L (ref 101–111)
Glucose, Bld: 84 mg/dL (ref 65–99)
Potassium: 3.6 mmol/L (ref 3.5–5.1)
SODIUM: 139 mmol/L (ref 135–145)

## 2017-08-16 LAB — BRAIN NATRIURETIC PEPTIDE: B Natriuretic Peptide: 161.3 pg/mL — ABNORMAL HIGH (ref 0.0–100.0)

## 2017-08-16 MED ORDER — SPIRONOLACTONE 25 MG PO TABS: 12.5000 mg | ORAL_TABLET | Freq: Every day | ORAL | 3 refills | Status: DC

## 2017-08-16 MED ORDER — LOSARTAN POTASSIUM 25 MG PO TABS: 25.0000 mg | ORAL_TABLET | Freq: Every day | ORAL | 3 refills | Status: DC

## 2017-08-16 MED ORDER — CARVEDILOL 3.125 MG PO TABS: 6.2500 mg | ORAL_TABLET | Freq: Two times a day (BID) | ORAL | 3 refills | Status: DC

## 2017-08-16 MED ORDER — ISOSORB DINITRATE-HYDRALAZINE 20-37.5 MG PO TABS: 1.0000 | ORAL_TABLET | Freq: Three times a day (TID) | ORAL | 3 refills | Status: DC

## 2017-08-16 MED ORDER — TORSEMIDE 20 MG PO TABS: 20.0000 mg | ORAL_TABLET | Freq: Every day | ORAL | 3 refills | Status: DC

## 2017-08-16 MED ORDER — ISOSORB DINITRATE-HYDRALAZINE 20-37.5 MG PO TABS: 0.5000 | ORAL_TABLET | Freq: Three times a day (TID) | ORAL | 11 refills | Status: DC

## 2017-08-16 MED ORDER — ISOSORB DINITRATE-HYDRALAZINE 20-37.5 MG PO TABS: 0.5000 | ORAL_TABLET | Freq: Three times a day (TID) | ORAL | 5 refills | Status: DC

## 2017-08-16 NOTE — Patient Instructions (Signed)
It was great to see you today!  Please RESTART carvedilol 6.25 mg (2 tablets) TWICE DAILY.   Please START Bidil 1/2 tablets THREE TIMES DAILY.   Blood work today. We will call you with any changes.   Please keep your appointment with Dr. [name] on 09/20/17.

## 2017-08-16 NOTE — Progress Notes (Signed)
HF MD: Bradley Powell  HPI:  Bradley Powell a 36 y.o.african american malewith a history of combined systolic/diastolic heart failure, Sinus Tach 1st degree heart block, obesity, htn, and gout.   Recent admission 4/13 through 03/07/17 with marked volume overload. Diuresed with IV lasix tid + metolazone.Had RHC/LHC as noted below. Overall diuresed 45 pounds. On the day of discharge carvedilol was restarted. Discharge weight was 281 pounds.   He presents today for pharmacist-led heart failure medication optimization appointment. At last appointment on 03/30/17 with [name], patient was started on coreg 3.125 mg BID and spironolactone 12.5 mg daily. Patient previously had orthostatic hypotension that resulted in syncope, therefore entresto was stopped - will not rechallenge at this time.   Patient returns for pharmacist-led HF medication titration. At last HF clinic visit on 07/19/17, his losartan was increased to 25 mg QHS and allopurinol 200 mg daily was initiated. He reports good compliance with his medication regimen but does admit to running out of carvedilol about 2 days ago. Patient denies orthopnea, dizziness, SOB, or any further episodes of near-syncope. Weight continues to slowly trend up but patient admits to little/no exercise for the past 3-4 weeks 2/2 gout. It has now improved and he started jogging again last week. He is not having any symptoms of fluid overload at this time which usually consist of productive coughing and LE edema for him. 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     . Shortness of breath/dyspnea on exertion? no  . Orthopnea/PND? no . Edema? Yes - trace . Lightheadedness/dizziness? Yes - only when bending over . Daily weights at home? Yes - 285-290 lb . Blood pressure/heart rate monitoring at home? no . Following low-sodium/fluid-restricted diet? yes  HF Medications: Carvedilol 6.25 mg PO BID - out x 2 days Losartan 25 mg PO QHS Spironolactone 12.5  mg PO QHS Torsemide 20 mg PO daily   Has the patient been experiencing any side effects to the medications prescribed?  no  Does the patient have any problems obtaining medications due to transportation or finances?   Yes - [place] Medicaid family planning only; paying out of pocket for his medications which he is able to do at this time  Understanding of regimen: good Understanding of indications: good Potential of compliance: good Patient understands to avoid NSAIDs. Patient understands to avoid decongestants.    Pertinent Lab Values: . 08/16/17: Serum creatinine 1.09, BUN 12, Potassium 3.6, Sodium 139, BNP 161  Vital Signs: . Weight: 298 lb (last clinic visit weight: 294 lb) . Blood pressure: 112/82 mmHg  . Heart rate: 94 bpm    Assessment: 1. Chronicsystolic CHF (EF 45-40%>>98-11% on 07/19/17), due to NICM likely with sleep apnea component. NYHA class II symptoms.  - Volume status stable. Weight increase seems to be 2/2 decreased exercise 2/2 gout  - Had a long discussion about attempting to restart Entresto which he is not interested in doing at this time but he was amenable to trying low dose Bidil 0.5 tablets TID (provided with samples today and filled out patient assistance form)  - Restart carvedilol 6.25 mg BID, losartan 25 mg QHS, spironolactone 12.5 mg QHS and torsemide 20 mg daily  - Basic disease state pathophysiology, medication indication, mechanism and side effects reviewed at length with patient and he verbalized understanding 2. Suspected Sleep Apnea  - Referred for sleep study, should be able to be scheduled soon 3. Morbid Obesity: Body mass index is 44.74 kg/m.    -  Discussed weight loss and portion control as well as regular exercise 4. Gout  - He has frequent flares, most recent improved  - Contineu allopurinol 5. PVCs  - ? If cardiomyopathy PVC-related. 24-hour monitor  7/18 only occasional PVCs  Plan: 1) Medication changes: Based on clinical presentation,  vital signs and recent labs will restart carvedilol 6.25 mg BID and start Bidil 0.5 tablets TID 2) Labs: BMET/BNP today  3) Follow-up: Dr. Gala Powell on 09/20/17   [name], PharmD, BCPS, CPP Clinical Pharmacist Pager: [phone] Phone: [phone] 08/16/2017 2:58 PM

## 2017-08-17 ENCOUNTER — Other Ambulatory Visit (HOSPITAL_COMMUNITY): Payer: Self-pay | Admitting: *Deleted

## 2017-08-17 MED ORDER — ISOSORB DINITRATE-HYDRALAZINE 20-37.5 MG PO TABS: 0.5000 | ORAL_TABLET | Freq: Three times a day (TID) | ORAL | 5 refills | Status: DC

## 2017-08-17 MED ORDER — ISOSORB DINITRATE-HYDRALAZINE 20-37.5 MG PO TABS: 1.0000 | ORAL_TABLET | Freq: Three times a day (TID) | ORAL | 3 refills | Status: DC

## 2017-08-22 ENCOUNTER — Telehealth (HOSPITAL_COMMUNITY): Payer: Self-pay | Admitting: Pharmacist

## 2017-08-22 NOTE — Telephone Encounter (Signed)
Received a phone call from [employer] wanting to verify spelling of patient's last name. I have verified the last name and was told we should have a decision about his Bidil patient assistance within 3 days.   Bradley Powell. Bonnye Fava, PharmD, BCPS, CPP Clinical Pharmacist Pager: [phone] Phone: [phone] 08/22/2017 3:36 PM

## 2017-08-28 ENCOUNTER — Telehealth (HOSPITAL_COMMUNITY): Payer: Self-pay | Admitting: Pharmacist

## 2017-08-28 NOTE — Telephone Encounter (Signed)
[employer] patient assistance approved for Bidil 1 tab TID through 08/25/18. Company does not have any in stock yet but will send out 3 month supplies once received.   [name], PharmD, BCPS, CPP Clinical Pharmacist Pager: [phone] Phone: [phone] 08/28/2017 9:35 AM

## 2017-08-29 ENCOUNTER — Telehealth (HOSPITAL_COMMUNITY): Payer: Self-pay | Admitting: Pharmacist

## 2017-08-29 ENCOUNTER — Other Ambulatory Visit (HOSPITAL_COMMUNITY): Payer: Self-pay | Admitting: *Deleted

## 2017-08-29 ENCOUNTER — Ambulatory Visit (HOSPITAL_COMMUNITY)
Admission: RE | Admit: 2017-08-29 | Discharge: 2017-08-29 | Disposition: A | Discharge status: Home / Self Care | Payer: Medicaid Other | Source: Ambulatory Visit | Attending: Internal Medicine | Admitting: Internal Medicine

## 2017-08-29 ENCOUNTER — Telehealth (HOSPITAL_COMMUNITY): Payer: Self-pay | Admitting: *Deleted

## 2017-08-29 VITALS — BP 96/64 | HR 98 | Wt 297.4 lb

## 2017-08-29 DIAGNOSIS — I5022 Chronic systolic (congestive) heart failure: Secondary | ICD-10-CM

## 2017-08-29 DIAGNOSIS — G4730 Sleep apnea, unspecified: Secondary | ICD-10-CM

## 2017-08-29 LAB — BRAIN NATRIURETIC PEPTIDE: B NATRIURETIC PEPTIDE 5: 55 pg/mL (ref 0.0–100.0)

## 2017-08-29 LAB — BASIC METABOLIC PANEL
ANION GAP: 8 (ref 5–15)
BUN: 16 mg/dL (ref 6–20)
CALCIUM: 9.8 mg/dL (ref 8.9–10.3)
CO2: 26 mmol/L (ref 22–32)
Chloride: 100 mmol/L — ABNORMAL LOW (ref 101–111)
Creatinine, Ser: 1.16 mg/dL (ref 0.61–1.24)
GFR calc Af Amer: >60 mL/min (ref >60)
Glucose, Bld: 182 mg/dL — ABNORMAL HIGH (ref 65–99)
POTASSIUM: 4.1 mmol/L (ref 3.5–5.1)
SODIUM: 134 mmol/L — AB (ref 135–145)

## 2017-08-29 MED ORDER — TORSEMIDE 20 MG PO TABS: 20.0000 mg | ORAL_TABLET | ORAL | 3 refills | Status: DC

## 2017-08-29 NOTE — Progress Notes (Signed)
Patient is not orthostatic, BP soft, HR elevated, weight down 1 lb from last clinic visit. Per discussion with Dr. [name], will decrease torsemide to 20 mg QOD starting Thursday. I have advised Bradley Powell to hold his evening doses of medications today and restart tomorrow. I have also asked him to call back if he starts to feel any worsening dizziness.   [name], PharmD, BCPS, CPP Clinical Pharmacist Pager: [phone] Phone: [phone] 08/29/2017 12:51 PM

## 2017-08-29 NOTE — Telephone Encounter (Signed)
[facility] called asking for the order for CPAP titration.  Order placed.

## 2017-08-29 NOTE — Telephone Encounter (Signed)
Mr. Score called stating that this morning he feels very dizzy. Bidil 0.5 tab TID was started 2 weeks ago and he had been feeling well on this until this morning. We reviewed his medications and he is taking them as prescribed. His weight is down some but he has been actively trying to lose weight. I have asked him not to take any of his afternoon doses of his medications today and try to eat something salty for now. When he feels up to it, I have asked him to come to clinic for lab work/BP check either this afternoon or tomorrow morning.   [name], PharmD, BCPS, CPP Clinical Pharmacist Pager: [phone] Phone: [phone] 08/29/2017 10:00 AM

## 2017-08-30 ENCOUNTER — Ambulatory Visit (HOSPITAL_BASED_OUTPATIENT_CLINIC_OR_DEPARTMENT_OTHER): Payer: Self-pay | Attending: Internal Medicine | Admitting: Cardiology

## 2017-08-30 VITALS — Ht 68.0 in | Wt 285.0 lb

## 2017-08-30 DIAGNOSIS — G4733 Obstructive sleep apnea (adult) (pediatric): Secondary | ICD-10-CM | POA: Insufficient documentation

## 2017-08-30 DIAGNOSIS — G4730 Sleep apnea, unspecified: Secondary | ICD-10-CM

## 2017-08-31 NOTE — Procedures (Signed)
   Patient Name: Bradley Powell, Bradley Powell Date: 08/30/2017 Gender: Male D.O.B: 10-16-1981 Age (years): 35 Referring Provider: Bevelyn Buckles Bensimhon Height (inches): 68 Interpreting Physician: Armanda Magic MD, ABSM Weight (lbs): 285 RPSGT: Shelah Lewandowsky BMI: 43 MRN: [id number] Neck Size: 18.00  CLINICAL INFORMATION The patient is referred for a CPAP titration to treat sleep apnea.  SLEEP STUDY TECHNIQUE As per the AASM Manual for the Scoring of Sleep and Associated Events v2.3 (April 2016) with a hypopnea requiring 4% desaturations.  The channels recorded and monitored were frontal, central and occipital EEG, electrooculogram (EOG), submentalis EMG (chin), nasal and oral airflow, thoracic and abdominal wall motion, anterior tibialis EMG, snore microphone, electrocardiogram, and pulse oximetry. Continuous positive airway pressure (CPAP) was initiated at the beginning of the study and titrated to treat sleep-disordered breathing.  MEDICATIONS Medications self-administered by patient taken the night of the study : CARVEDILOL, LOSARTAN, SPIRONOLACTONE  TECHNICIAN COMMENTS Comments added by technician: Patient had difficulty initiating sleep.  Comments added by scorer: N/A  RESPIRATORY PARAMETERS Optimal PAP Pressure (cm): 13  AHI at Optimal Pressure (/hr):0.0 Overall Minimal O2 (%):92.00  Supine % at Optimal Pressure (%):22 Minimal O2 at Optimal Pressure (%): 95.0    SLEEP ARCHITECTURE The study was initiated at 11:01:27 PM and ended at 5:29:08 AM.  Sleep onset time was 47.5 minutes and the sleep efficiency was 60.0%. The total sleep time was 232.5 minutes.  The patient spent 10.32% of the night in stage N1 sleep, 67.96% in stage N2 sleep, 0.00% in stage N3 and 21.72% in REM.Stage REM latency was 147.0 minutes  Wake after sleep onset was 107.7. Alpha intrusion was absent. Supine sleep was 45.59%.  CARDIAC DATA The 2 lead EKG demonstrated sinus rhythm. The mean heart rate  was 91.09 beats per minute. Other EKG findings include: PVCs.  LEG MOVEMENT DATA The total Periodic Limb Movements of Sleep (PLMS) were 0. The PLMS index was 0.00. A PLMS index of <15 is considered normal in adults.  IMPRESSIONS - The optimal PAP pressure was 13 cm of water. - Central sleep apnea was not noted during this titration (CAI = 0.0/h). - Significant oxygen desaturations were not observed during this titration (min O2 = 92.00%). - No snoring was audible during this study. - 2-lead EKG demonstrated: PVCs - Clinically significant periodic limb movements were not noted during this study. Arousals associated with PLMs were rare.  DIAGNOSIS - Obstructive Sleep Apnea (327.23 [G47.33 ICD-10])  RECOMMENDATIONS - Trial of CPAP therapy on 13 cm H2O with a Medium size Fisher&Paykel Full Face Mask Simplus mask and heated humidification. - Avoid alcohol, sedatives and other CNS depressants that may worsen sleep apnea and disrupt normal sleep architecture. - Sleep hygiene should be reviewed to assess factors that may improve sleep quality. - Weight management and regular exercise should be initiated or continued. - Return to Sleep Center for re-evaluation after 10 weeks of therapy  Armanda Magic Diplomate, American Board of Sleep Medicine  ELECTRONICALLY SIGNED ON:  08/31/2017, 4:22 PM [facility] PH: [phone]   FX: [phone] ACCREDITED BY THE AMERICAN ACADEMY OF SLEEP MEDICINE

## 2017-09-05 ENCOUNTER — Telehealth: Payer: Self-pay | Admitting: *Deleted

## 2017-09-05 NOTE — Telephone Encounter (Signed)
-----   Message from Quintella Reichert, MD sent at 08/31/2017  4:25 PM EDT ----- Please let patient know that they had a successful PAP titration and let DME know that orders are in EPIC.  Please set up 10 week OV with me.

## 2017-09-05 NOTE — Telephone Encounter (Signed)
Informed patient of titration results and verbalized understanding was indicated. Patient understands his CPAP Titration was successful and Dr Mayford Knife has ordered him a CPAP in EPIC. Patient understands he will be contacted by DME to set up his cpap. He understands to call if DME does not contact him with new setup in a timely manner. He understands he will be called once confirmation has been received from DME that he has received his new machine to schedule 10 week follow up appointment.  DME notified of new cpap order  Please add to Bradley Powell He was grateful for the call and thanked me

## 2017-09-07 NOTE — Telephone Encounter (Addendum)
Patient states he does not have insurance but he has [facility] patient assistant. Reached out to the patient assistance department and was informed that they only help with in- patient cost. Reached back out the the patient and informed him that [facility] is not going to cover his CPAP but he could apply to the Patient Assistance Program where the cost is a one time fee of $100. Patient states he would like to try that program but he does not have the the finances right now but he will call when he does.

## 2017-09-20 ENCOUNTER — Other Ambulatory Visit: Payer: Self-pay

## 2017-09-20 ENCOUNTER — Ambulatory Visit (HOSPITAL_COMMUNITY)
Admission: RE | Admit: 2017-09-20 | Discharge: 2017-09-20 | Disposition: A | Discharge status: Home / Self Care | Payer: Medicaid Other | Source: Ambulatory Visit | Attending: Internal Medicine | Admitting: Internal Medicine

## 2017-09-20 ENCOUNTER — Encounter (HOSPITAL_COMMUNITY): Payer: Self-pay | Admitting: Internal Medicine

## 2017-09-20 VITALS — BP 130/64 | HR 102 | Wt 305.4 lb

## 2017-09-20 DIAGNOSIS — Z8249 Family history of ischemic heart disease and other diseases of the circulatory system: Secondary | ICD-10-CM | POA: Insufficient documentation

## 2017-09-20 DIAGNOSIS — I110 Hypertensive heart disease with heart failure: Secondary | ICD-10-CM | POA: Insufficient documentation

## 2017-09-20 DIAGNOSIS — I493 Ventricular premature depolarization: Secondary | ICD-10-CM | POA: Insufficient documentation

## 2017-09-20 DIAGNOSIS — I5022 Chronic systolic (congestive) heart failure: Secondary | ICD-10-CM

## 2017-09-20 DIAGNOSIS — Z79899 Other long term (current) drug therapy: Secondary | ICD-10-CM | POA: Insufficient documentation

## 2017-09-20 DIAGNOSIS — M109 Gout, unspecified: Secondary | ICD-10-CM | POA: Insufficient documentation

## 2017-09-20 DIAGNOSIS — Z823 Family history of stroke: Secondary | ICD-10-CM | POA: Insufficient documentation

## 2017-09-20 DIAGNOSIS — I5042 Chronic combined systolic (congestive) and diastolic (congestive) heart failure: Secondary | ICD-10-CM | POA: Insufficient documentation

## 2017-09-20 DIAGNOSIS — Z820 Family history of epilepsy and other diseases of the nervous system: Secondary | ICD-10-CM | POA: Insufficient documentation

## 2017-09-20 DIAGNOSIS — Z6841 Body Mass Index (BMI) 40.0 and over, adult: Secondary | ICD-10-CM | POA: Insufficient documentation

## 2017-09-20 MED ORDER — LOSARTAN POTASSIUM 50 MG PO TABS: 50.0000 mg | ORAL_TABLET | Freq: Every day | ORAL | 6 refills | Status: DC

## 2017-09-20 NOTE — Addendum Note (Signed)
Encounter addended by: Noralee Space, RN on: 09/20/2017 3:39 PM  Actions taken: Pharmacy for encounter modified, Order list changed, Sign clinical note

## 2017-09-20 NOTE — Patient Instructions (Signed)
Increase Losartan to 50 mg daily  Your physician recommends that you schedule a follow-up appointment in: 2 weeks with Fara Chute D  Your physician recommends that you schedule a follow-up appointment in: 2 months

## 2017-09-20 NOTE — Progress Notes (Signed)
Advanced Heart Failure Clinic Note   PCP: Dr Nehemiah SettlePolite  Primary HF Cardiologist: Dr Gala RomneyBensimhon  HPI: Bradley Powell is a 36 y.o. male with a history of combined systolic/diastolic heart failure,  obesity, htn, and gout.   Admitted 4/13 through 03/07/17 with marked volume overload. EF 40-45%. Diuresed with IV lasix tid + metolazone. Had RHC/LHC as noted below. Overall diuresed 45 pounds. On the day of discharge carvedilol was restarted. Discharge weight was 281 pounds.    Echo 9/18 EF ~25-30%.   Returns for routine f/u. Recently torsemide decreased from 20 daily to 20 qod due to low BP and lightheadedness. Says he is tired recently. Working as lot as a Financial plannerband director. Denies swelling, orthopnea or PND. Not taking any extra torsemide. No further lightheadedness. Walking at least 1 mile/day and was marching in band competition.   Holter 7/18: 6% PVCs  Sleep study AHI 26/hr RHC/LHC 03/01/2017  AO = 109/86 (97) LV =  101/28 RA =  29 RV = 50/22 PA = 55/24 (38) PCW = 26 Fick cardiac output/index = 6.4/2.6 Thermal CO/CI = 4.9/2.0 PVR = 2.5 WU FA sat = 98% PA sat = 69%, 71% SVC sat = 82%, 82% IVC sat = 66% Assessment: 1. Normal coronary arteries  2. Mild to moderate pulmonary HTN with normal PVR 3. Biventricular volume overload with R > L heart failure 4. Moderately decreased CO by thermodilution  5. 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 No intra-cardiac shunt  Review of systems complete and found to be negative unless listed in HPI.    SH:  Social History   Socioeconomic History  . Marital status: Single    Spouse name: Not on file  . Number of children: Not on file  . Years of education: Not on file  . Highest education level: Not on file  Social Needs  . Financial resource strain: Not on file  . Food insecurity - worry: Not on file  . Food insecurity - inability: Not on file  .  Transportation needs - medical: Not on file  . Transportation needs - non-medical: Not on file  Occupational History  . Not on file  Tobacco Use  . Smoking status: Never Smoker  . Smokeless tobacco: Never Used  Substance and Sexual Activity  . Alcohol use: Yes    Comment: rarely  . Drug use: No  . Sexual activity: Not on file  Other Topics Concern  . Not on file  Social History Narrative  . Not on file    FH:  Family History  Problem Relation Age of Onset  . CVA Mother        pacemaker  . Multiple sclerosis Mother   . Seizures Mother   . Hypertension Father   . Gout Father     Past Medical History:  Diagnosis Date  . History of esophagogastroduodenoscopy (EGD)     Current Outpatient Medications  Medication Sig Dispense Refill  . acetaminophen (TYLENOL) 500 MG tablet Take 500 mg by mouth every 6 (six) hours as needed (pain).    Marland Kitchen. allopurinol (ZYLOPRIM) 100 MG tablet Take 2 tablets (200 mg total) by mouth daily. 60 tablet 3  . carvedilol (COREG) 3.125 MG tablet Take 2 tablets (6.25 mg total) by mouth 2 (two) times daily with a meal. 120 tablet 3  . colchicine 0.6 MG tablet Take 0.6 mg by mouth daily as needed (gout flare).     .Marland Kitchen  isosorbide-hydrALAZINE (BIDIL) 20-37.5 MG tablet Take 0.5 tablets by mouth 3 (three) times daily. 45 tablet 5  . losartan (COZAAR) 25 MG tablet Take 1 tablet (25 mg total) by mouth at bedtime. 30 tablet 3  . spironolactone (ALDACTONE) 25 MG tablet Take 0.5 tablets (12.5 mg total) by mouth at bedtime. 15 tablet 3  . torsemide (DEMADEX) 20 MG tablet Take 1 tablet (20 mg total) by mouth every other day. 15 tablet 3   No current facility-administered medications for this encounter.    Vitals:   09/20/17 1519  BP: 130/64  Pulse: (!) 102  SpO2: 98%  Weight: (!) 305 lb 6.4 oz (138.5 kg)   Wt Readings from Last 3 Encounters:  09/20/17 (!) 305 lb 6.4 oz (138.5 kg)  08/30/17 285 lb (129.3 kg)  08/29/17 297 lb 6.4 oz (134.9 kg)   PHYSICAL  EXAM: General:  Well appearing. No resp difficulty HEENT: normal Neck: supple. no JVD. Carotids 2+ bilat; no bruits. No lymphadenopathy or thryomegaly appreciated. Cor: PMI nondisplaced.Mildly tachy regular. No rubs, gallops or murmurs. Lungs: clear Abdomen: obese soft, nontender, nondistended. No hepatosplenomegaly. No bruits or masses. Good bowel sounds. Extremities: no cyanosis, clubbing, rash, edema Neuro: alert & orientedx3, cranial nerves grossly intact. moves all 4 extremities w/o difficulty. Affect pleasant   ASSESSMENT & PLAN: 1. Combined Systolic/Diastolic Heart Failure- ECHO 02/22/2017 EF 40-45% Grade II DD. Peak PA pressure 46 mmhg. Echo 9/18 EF ~25-30% (formnal read 20-25%) - Stable NYHA II  Volume status stable. Torsemide recently reduced to 20 qod due to low BP. Can take extra as needed.  - Continue carvedilol 6.25 mg twice a day - Continue spiro 12.5 mg daily - Continue Bidil 0.5 TID  - Increase losartan to 50 qhs. Will not rechallenge with Entresto just yet given previous severe orthostasis and nausea - I think we need to see hime every 3-4 weeks for aggressive med titration. Schedule f/u with PharmD 2-3 weeks to increase carvedilol  - If EF not improving on next echo will need to consider ICD but currently NYHA I - Too large for cMRI - BMET today 2. Suspected Sleep Apnea -Sleep study positive -Starting CPAP 3. Morbid Obesity: Body mass index is 46.44 kg/m.   - Discussed weight loss and portion control. 4. Gout - Continue allopurinol 5. PVCs  - ? If cardiomyopathy PVC-related. 24-hour monitor  7/18  PVCs 6%  Arvilla Meres, MD  3:19 PM

## 2017-10-09 ENCOUNTER — Ambulatory Visit (HOSPITAL_COMMUNITY)
Admission: RE | Admit: 2017-10-09 | Discharge: 2017-10-09 | Disposition: A | Discharge status: Home / Self Care | Payer: Medicaid Other | Source: Ambulatory Visit | Attending: Cardiology | Admitting: Cardiology

## 2017-10-09 VITALS — BP 112/76 | HR 96 | Wt 308.2 lb

## 2017-10-09 DIAGNOSIS — Z79899 Other long term (current) drug therapy: Secondary | ICD-10-CM | POA: Insufficient documentation

## 2017-10-09 DIAGNOSIS — I5022 Chronic systolic (congestive) heart failure: Secondary | ICD-10-CM | POA: Insufficient documentation

## 2017-10-09 DIAGNOSIS — I493 Ventricular premature depolarization: Secondary | ICD-10-CM | POA: Insufficient documentation

## 2017-10-09 DIAGNOSIS — I110 Hypertensive heart disease with heart failure: Secondary | ICD-10-CM | POA: Insufficient documentation

## 2017-10-09 DIAGNOSIS — M109 Gout, unspecified: Secondary | ICD-10-CM | POA: Insufficient documentation

## 2017-10-09 DIAGNOSIS — Z6841 Body Mass Index (BMI) 40.0 and over, adult: Secondary | ICD-10-CM | POA: Insufficient documentation

## 2017-10-09 LAB — BASIC METABOLIC PANEL
Anion gap: 8 (ref 5–15)
BUN: 14 mg/dL (ref 6–20)
CALCIUM: 9.2 mg/dL (ref 8.9–10.3)
CHLORIDE: 101 mmol/L (ref 101–111)
CO2: 27 mmol/L (ref 22–32)
Creatinine, Ser: 1.15 mg/dL (ref 0.61–1.24)
GFR calc Af Amer: >60 mL/min (ref >60)
GFR calc non Af Amer: >60 mL/min (ref >60)
Glucose, Bld: 124 mg/dL — ABNORMAL HIGH (ref 65–99)
Potassium: 3.9 mmol/L (ref 3.5–5.1)
SODIUM: 136 mmol/L (ref 135–145)

## 2017-10-09 LAB — BRAIN NATRIURETIC PEPTIDE: B Natriuretic Peptide: 41.6 pg/mL (ref 0.0–100.0)

## 2017-10-09 MED ORDER — CARVEDILOL 12.5 MG PO TABS: 12.5000 mg | ORAL_TABLET | Freq: Two times a day (BID) | ORAL | 5 refills | Status: DC

## 2017-10-09 NOTE — Progress Notes (Signed)
HF MD: [name]  HPI:  Bradley Powell Richbergis a 36 y.o.african american malewith a history of combined systolic/diastolic heart failure, Sinus Tach 1st degree heart block, obesity, htn, and gout.   Last admission 4/13 through 03/07/17 with marked volume overload. Diuresed with IV lasix tid + metolazone.Had RHC/LHC as noted below. Overall diuresed 45 pounds. On the day of discharge carvedilol was restarted. Discharge weight was 281 pounds.   He presents today for pharmacist led heart failure medication titration visit. At last pharmacy visit on 08/16/17, carvedilol was restarted at 6.25 mg BID and Bidil 0.5 mg TID was started. He saw Dr [name] on 09/20/17 and his losartan was increased from 25 mg QHS to 50 mg QHS. Patient previously had orthostatic hypotension that resulted in syncope, therefore Entresto was stopped - will not rechallenge at this time. Patient is doing well. He denies orthopnea, dizziness, SOB, or any further episodes of near-syncope. Weight continues to slowly trend up but patient admits it is likely diet related and possible fluid retention which often presents in him as a productive cough and/or LE edema. His cough started around 2 weeks ago along with nasal congestion (started Allegra prn) and he did miss 2 doses of his torsemide over the past week.   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 With parades coming up, he will be walking around 2-3 miles and says he also tries to jog alongside the band. He mentions starting a  "soup diet" and is trying to decrease portion sizes. Told him to double check the labels to ensure low/no sodium. He reports adherence to his current medications and did take his morning doses today. He has no issues affording his medications at this time although he still does not have active Rx insurance. Has PAF for Bidil but has not received any in the mail yet - still has plenty of samples at this time.    . Shortness of breath/dyspnea on exertion? no  . Orthopnea/PND? no  . Edema? no . Lightheadedness/dizziness? no . Daily weights at home? Yes: 289-290 lbs slowly trending up . Blood pressure/heart rate monitoring at home? no . Following low-sodium/fluid-restricted diet? yes  HF Medications: Carvedilol 6.25 mg BID  Spironolactone 12.5 mg QD  BiDil 0.5 mg TID  Losartan 50 mg QHS  Torsemide 20 mg QOD with additional 20 mg PRN weight gain/SOB/edema  Has the patient been experiencing any side effects to the medications prescribed?  no  Does the patient have any problems obtaining medications due to transportation or finances?   No - no current Rx insurance, Bidil PAF active   Understanding of regimen: excellent Understanding of indications: excellent Potential of compliance: excellent Patient understands to avoid NSAIDs. Patient understands to avoid decongestants.    Pertinent Lab Values: . 10/09/17: Serum creatinine 1.15, BUN 14, Potassium 3.9, Sodium 136, BNP 41  Vital Signs: . Weight: 308 (last visit on 09/20/17 was 305 lbs) . Blood pressure: 112/76 mmHg  . Heart rate: 96 bpm   Assessment: 1. Combined Systolic/Diastolic Heart Failure- ECHO 02/22/2017 EF 40-45% Grade II DD. Peak PA pressure 46 mmhg. Echo 9/18 EF ~25-30% (formnal read 20-25%) - Stable NYHA II  - Volume status likely increased although some of his weight gain is attributed to diet and decreased exercise. - Takes torsemide 5 days per week (M, W, Thur, Sat, Sun) - can take extra as needed - Increase carvedilol to 12.5 mg BID  - Continue spironolactone  12.5 mg QD, BiDil 0.5 mg TID, Losartan 50 mg QHS - Will not rechallenge with Entresto just yet given previous severe orthostasis and nausea - If EF not improving on next echo will need to consider ICD but currently NYHA I - Too large for cMRI - Basic disease state pathophysiology, medication indication, mechanism and side effects reviewed at length with patient and  he verbalized understanding  2. Suspected Sleep Apnea -Sleep study positive -Starting CPAP, has not used it yet but has paid for it and plan to pick it up soon   3. Morbid Obesity: Body mass index is 46.44 kg/m.   - Actively trying to lose weight. Watching his diet/portions and increasing exercise.   4. Gout - Continue allopurinol  5. PVCs  - ? If cardiomyopathy PVC-related. 24-hour monitor  7/18  PVCs 6%  Plan: 1) Medication changes: Based on clinical presentation, vital signs and recent labs will increase carvedilol to 12.5 mg BID and take torsemide 20 mg daily x 4 days 2) Labs: BMET and BNP today  3) Follow-up: Dr Gala RomneyBensimhon on 11/22/17  San MorelleEmily Sotelo, PharmD Candidate   Tyler DeisErika K. Bonnye FavaNicolsen, PharmD, BCPS, CPP Clinical Pharmacist Pager: [phone] Phone: [phone] 10/09/2017 2:40 PM

## 2017-10-09 NOTE — Patient Instructions (Addendum)
It was great to see you today!  Please INCREASE your carvedilol to 12.5 mg (1 tablet) TWICE DAILY.   Please take your torsemide 20 mg tablets DAILY x 4 days then resume every other day dosing with additional as needed for weight gain, swelling or shortness of breath.   Blood work today. We will call you with any changes.   Please keep your appointment with Dr. [name] on 11/22/16. Garage code [id number].

## 2017-10-16 ENCOUNTER — Encounter (HOSPITAL_BASED_OUTPATIENT_CLINIC_OR_DEPARTMENT_OTHER): Payer: Self-pay

## 2017-10-19 ENCOUNTER — Telehealth (HOSPITAL_COMMUNITY): Payer: Self-pay | Admitting: *Deleted

## 2017-10-19 NOTE — Telephone Encounter (Signed)
Received pt's Bidil from [facility], left him message med was at our front desk for p/u

## 2017-11-22 ENCOUNTER — Ambulatory Visit (HOSPITAL_COMMUNITY)
Admission: RE | Admit: 2017-11-22 | Discharge: 2017-11-22 | Disposition: A | Discharge status: Home / Self Care | Payer: Medicaid Other | Source: Ambulatory Visit | Attending: Internal Medicine | Admitting: Internal Medicine

## 2017-11-22 ENCOUNTER — Telehealth: Payer: Self-pay | Admitting: Cardiology

## 2017-11-22 ENCOUNTER — Encounter (HOSPITAL_COMMUNITY): Payer: Medicaid Other | Admitting: Internal Medicine

## 2017-11-22 ENCOUNTER — Encounter (HOSPITAL_COMMUNITY): Payer: Self-pay | Admitting: Internal Medicine

## 2017-11-22 VITALS — BP 100/76 | HR 83 | Wt 306.5 lb

## 2017-11-22 DIAGNOSIS — I272 Pulmonary hypertension, unspecified: Secondary | ICD-10-CM | POA: Insufficient documentation

## 2017-11-22 DIAGNOSIS — I110 Hypertensive heart disease with heart failure: Secondary | ICD-10-CM | POA: Insufficient documentation

## 2017-11-22 DIAGNOSIS — I493 Ventricular premature depolarization: Secondary | ICD-10-CM | POA: Insufficient documentation

## 2017-11-22 DIAGNOSIS — I5042 Chronic combined systolic (congestive) and diastolic (congestive) heart failure: Secondary | ICD-10-CM | POA: Insufficient documentation

## 2017-11-22 DIAGNOSIS — M109 Gout, unspecified: Secondary | ICD-10-CM | POA: Insufficient documentation

## 2017-11-22 DIAGNOSIS — Z79899 Other long term (current) drug therapy: Secondary | ICD-10-CM | POA: Insufficient documentation

## 2017-11-22 DIAGNOSIS — Z6841 Body Mass Index (BMI) 40.0 and over, adult: Secondary | ICD-10-CM | POA: Insufficient documentation

## 2017-11-22 DIAGNOSIS — I5022 Chronic systolic (congestive) heart failure: Secondary | ICD-10-CM

## 2017-11-22 DIAGNOSIS — Z823 Family history of stroke: Secondary | ICD-10-CM | POA: Insufficient documentation

## 2017-11-22 DIAGNOSIS — G4730 Sleep apnea, unspecified: Secondary | ICD-10-CM

## 2017-11-22 MED ORDER — SPIRONOLACTONE 25 MG PO TABS: 25.0000 mg | ORAL_TABLET | Freq: Every day | ORAL | 3 refills | Status: DC

## 2017-11-22 NOTE — Patient Instructions (Signed)
Increase Spironolactone to 25 mg daily  Labs in 10 days  Your physician recommends that you schedule a follow-up appointment in: 1 month with Fara Chute D  Your physician recommends that you schedule a follow-up appointment in: 2 months with echocardiogram

## 2017-11-22 NOTE — Telephone Encounter (Signed)
New message    Patient calling states, he can now pay the one fee of $100 for CPAP. Please call.

## 2017-11-22 NOTE — Progress Notes (Signed)
Advanced Heart Failure Clinic Note   PCP: Dr Nehemiah Settle  Primary HF Cardiologist: Dr [name]  HPI: Bradley Powell is a 37 y.o. male with a history of combined systolic/diastolic heart failure,  obesity, HTN, and gout.   Admitted 4/13 through 03/07/17 with marked volume overload. EF 40-45%. Diuresed with IV lasix tid + metolazone. Had RHC/LHC as noted below. Overall diuresed 45 pounds. On the day of discharge carvedilol was restarted. Discharge weight was 281 pounds.   Today he returns for HF follow up. Last visit carvedilol was increased to 12.5 mg twice a day. Overall feeling fine. Denies SOB/PND/Orthopnea. No edema. Appetite ok. Tries to follow low salt diet. No fever or chills.  Walking 6,700 steps a day. Weight at home 290-295 pounds.  Taking all medications  Echo 9/18 EF ~25-30%.   Holter 7/18: 6% PVCs  Sleep study AHI 26/hr RHC/LHC 03/01/2017  AO = 109/86 (97) LV =  101/28 RA =  29 RV = 50/22 PA = 55/24 (38) PCW = 26 Fick cardiac output/index = 6.4/2.6 Thermal CO/CI = 4.9/2.0 PVR = 2.5 WU FA sat = 98% PA sat = 69%, 71% SVC sat = 82%, 82% IVC sat = 66% Assessment: 1. Normal coronary arteries  2. Mild to moderate pulmonary HTN with normal PVR 3. Biventricular volume overload with R > L heart failure 4. Moderately decreased CO by thermodilution  5. 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 No intra-cardiac shunt  Review of systems complete and found to be negative unless listed in HPI.    SH:  Social History   Socioeconomic History  . Marital status: Single    Spouse name: Not on file  . Number of children: Not on file  . Years of education: Not on file  . Highest education level: Not on file  Social Needs  . Financial resource strain: Not on file  . Food insecurity - worry: Not on file  . Food insecurity - inability: Not on file  . Transportation needs - medical: Not on file    . Transportation needs - non-medical: Not on file  Occupational History  . Not on file  Tobacco Use  . Smoking status: Never Smoker  . Smokeless tobacco: Never Used  Substance and Sexual Activity  . Alcohol use: Yes    Comment: rarely  . Drug use: No  . Sexual activity: Not on file  Other Topics Concern  . Not on file  Social History Narrative  . Not on file    FH:  Family History  Problem Relation Age of Onset  . CVA Mother        pacemaker  . Multiple sclerosis Mother   . Seizures Mother   . Hypertension Father   . Gout Father     Past Medical History:  Diagnosis Date  . History of esophagogastroduodenoscopy (EGD)     Current Outpatient Medications  Medication Sig Dispense Refill  . acetaminophen (TYLENOL) 500 MG tablet Take 500 mg by mouth every 6 (six) hours as needed (pain).    Marland Kitchen allopurinol (ZYLOPRIM) 100 MG tablet Take 2 tablets (200 mg total) by mouth daily. 60 tablet 3  . carvedilol (COREG) 12.5 MG tablet Take 1 tablet (12.5 mg total) by mouth 2 (two) times daily. 60 tablet 5  . colchicine 0.6 MG tablet Take 0.6 mg by mouth daily as needed (gout flare).     . fexofenadine (ALLEGRA) 30  MG tablet Take 180 mg by mouth as needed.     . isosorbide-hydrALAZINE (BIDIL) 20-37.5 MG tablet Take 0.5 tablets by mouth 3 (three) times daily. 45 tablet 5  . losartan (COZAAR) 50 MG tablet Take 1 tablet (50 mg total) at bedtime by mouth. 30 tablet 6  . spironolactone (ALDACTONE) 25 MG tablet Take 0.5 tablets (12.5 mg total) by mouth at bedtime. 15 tablet 3  . torsemide (DEMADEX) 20 MG tablet Take 20 mg by mouth every other day.     No current facility-administered medications for this encounter.    Vitals:   11/22/17 1531  BP: 100/76  Pulse: 83  SpO2: 98%  Weight: (!) 306 lb 8 oz (139 kg)   Wt Readings from Last 3 Encounters:  11/22/17 (!) 306 lb 8 oz (139 kg)  10/09/17 (!) 308 lb 3.2 oz (139.8 kg)  09/20/17 (!) 305 lb 6.4 oz (138.5 kg)   PHYSICAL EXAM: General:   Well appearing. No resp difficulty HEENT: normal Neck: supple. no JVD. Carotids 2+ bilat; no bruits. No lymphadenopathy or thryomegaly appreciated. Cor: PMI nondisplaced. Regular rate & rhythm. No rubs, gallops or murmurs. Lungs: clear Abdomen: obese, soft, nontender, nondistended. No hepatosplenomegaly. No bruits or masses. Good bowel sounds. Extremities: warm. no cyanosis, clubbing, rash, edema Neuro: alert & orientedx3, cranial nerves grossly intact. moves all 4 extremities w/o difficulty. Affect pleasant   ASSESSMENT & PLAN: 1. Combined Systolic/Diastolic Heart Failure- ECHO 02/22/2017 EF 40-45% Grade II DD. Peak PA pressure 46 mmhg. Echo 9/18 EF ~20-25%--->Portable ECHO 40% NYHA II.   Volume status stable. Continue torsemide every other day.  - Continue carvedilol 12.5 mg twice a day.  - Increase spiro to 25 mg daily. BMET in 10 days.  - Continue Bidil 0.5 TID  -Continue losartan to 50 qhs. Will not rechallenge with Entresto just yet given previous severe orthostasis and nausea - Too large for cMRI 2. Suspected Sleep Apnea -Sleep study positive -Starting CPAP 3. Morbid Obesity: Body mass index is 46.6 kg/m.   - Discussed weight loss and portion control. 4. Gout - Continue allopurinol 5. PVCs  - ? If cardiomyopathy PVC-related. 24-hour monitor  7/18  PVCs 6%  Follow up 4 weeks wirh [name] Follow up 2 months with Dr [name] and ECHO .  [name], NP  3:38 PM  Patient seen and examined with [name], NP. We discussed all aspects of the encounter. I agree with the assessment and plan as stated above.   Continues to do well NYHA I-II. Tolerating med titration well. Now starting CPAP for OSA. Bedside echo done personally EF ~40% so no need for ICD. Increase spiro today .Refer to PharmD clinic for further med titration. Formal echo 2 months.   Arvilla Meres, MD  5:23 PM

## 2017-11-29 ENCOUNTER — Other Ambulatory Visit (HOSPITAL_COMMUNITY): Payer: Self-pay | Admitting: Internal Medicine

## 2017-12-01 ENCOUNTER — Telehealth: Payer: Self-pay | Admitting: *Deleted

## 2017-12-01 NOTE — Telephone Encounter (Signed)
Application for CPAP Assistance Program faxed over today. Patient will make his payment with the program assistant.

## 2017-12-01 NOTE — Telephone Encounter (Signed)
Reached out to the patient and he is now set up with the CPAP Assistance program. All paperwork has been faxed to the number provided. Patient will pay his one time fee with the program representative over the phone.

## 2017-12-04 ENCOUNTER — Other Ambulatory Visit (HOSPITAL_COMMUNITY): Payer: Medicaid Other

## 2017-12-06 ENCOUNTER — Ambulatory Visit (HOSPITAL_COMMUNITY)
Admission: RE | Admit: 2017-12-06 | Discharge: 2017-12-06 | Disposition: A | Discharge status: Home / Self Care | Payer: Medicaid Other | Source: Ambulatory Visit | Attending: Internal Medicine | Admitting: Internal Medicine

## 2017-12-06 DIAGNOSIS — I5022 Chronic systolic (congestive) heart failure: Secondary | ICD-10-CM | POA: Insufficient documentation

## 2017-12-06 LAB — BASIC METABOLIC PANEL
Anion gap: 11 (ref 5–15)
BUN: 18 mg/dL (ref 6–20)
CO2: 26 mmol/L (ref 22–32)
Calcium: 9.4 mg/dL (ref 8.9–10.3)
Chloride: 102 mmol/L (ref 101–111)
Creatinine, Ser: 1.36 mg/dL — ABNORMAL HIGH (ref 0.61–1.24)
GFR calc Af Amer: >60 mL/min (ref >60)
GLUCOSE: 105 mg/dL — AB (ref 65–99)
POTASSIUM: 4.2 mmol/L (ref 3.5–5.1)
SODIUM: 139 mmol/L (ref 135–145)

## 2017-12-07 NOTE — Telephone Encounter (Signed)
Patient called back to say he has the $100 needed to purchase the CPAP from the [employer]. His application has been sent and he paid his fee to [facility] the Media planner.  His CPAP will be shipped to [facility].

## 2017-12-25 ENCOUNTER — Ambulatory Visit (HOSPITAL_COMMUNITY)
Admission: RE | Admit: 2017-12-25 | Discharge: 2017-12-25 | Disposition: A | Discharge status: Home / Self Care | Payer: BLUE CROSS/BLUE SHIELD | Source: Ambulatory Visit | Attending: Internal Medicine | Admitting: Internal Medicine

## 2017-12-25 ENCOUNTER — Encounter (HOSPITAL_COMMUNITY): Payer: Self-pay

## 2017-12-25 VITALS — BP 102/70 | HR 75 | Wt 320.4 lb

## 2017-12-25 DIAGNOSIS — M109 Gout, unspecified: Secondary | ICD-10-CM | POA: Insufficient documentation

## 2017-12-25 DIAGNOSIS — Z6841 Body Mass Index (BMI) 40.0 and over, adult: Secondary | ICD-10-CM | POA: Diagnosis not present

## 2017-12-25 DIAGNOSIS — G4730 Sleep apnea, unspecified: Secondary | ICD-10-CM | POA: Diagnosis not present

## 2017-12-25 DIAGNOSIS — I110 Hypertensive heart disease with heart failure: Secondary | ICD-10-CM | POA: Diagnosis not present

## 2017-12-25 DIAGNOSIS — I5040 Unspecified combined systolic (congestive) and diastolic (congestive) heart failure: Secondary | ICD-10-CM | POA: Insufficient documentation

## 2017-12-25 DIAGNOSIS — I493 Ventricular premature depolarization: Secondary | ICD-10-CM | POA: Diagnosis not present

## 2017-12-25 DIAGNOSIS — I5022 Chronic systolic (congestive) heart failure: Secondary | ICD-10-CM

## 2017-12-25 LAB — BASIC METABOLIC PANEL
Anion gap: 12 (ref 5–15)
BUN: 15 mg/dL (ref 6–20)
CALCIUM: 9.5 mg/dL (ref 8.9–10.3)
CHLORIDE: 102 mmol/L (ref 101–111)
CO2: 26 mmol/L (ref 22–32)
Creatinine, Ser: 1.29 mg/dL — ABNORMAL HIGH (ref 0.61–1.24)
GFR calc Af Amer: >60 mL/min (ref >60)
GFR calc non Af Amer: >60 mL/min (ref >60)
GLUCOSE: 92 mg/dL (ref 65–99)
Potassium: 4 mmol/L (ref 3.5–5.1)
Sodium: 140 mmol/L (ref 135–145)

## 2017-12-25 MED ORDER — CARVEDILOL 12.5 MG PO TABS: ORAL_TABLET | ORAL | 5 refills | Status: DC

## 2017-12-25 NOTE — Progress Notes (Signed)
HF MD: Dr. [name]  HPI:  Bradley Welshans Richbergis a 37 y.o.AA malewith a history of combined systolic/diastolic heart failure,obesity, HTN, and gout.   Admitted 4/13 through 03/07/17 with marked volume overload. EF 40-45%. Diuresed with IV lasix tid + metolazone. Had RHC/LHC as noted below. Overall diuresed 45 pounds. On the day of discharge carvedilol was restarted. Discharge weight was 281 pounds.  Pt presents for pharmacist-led HF medication titration. At last clinic visit 11/22/17, spironolactone was increased to 25mg  daily, BMET remained fairly stable. Pt previously on Entresto but had syncope, so this was stopped and transitioned to losartan. Pt's wt has increased but he notes he has been doing CrossFit and eating more per his personal trainer's recommendations. Pt has noticed occasional LEE after strenuous workout but this is transient and he denies SOB/orthopnea. Pt reports his new insurance is no longer covering colchicine.   Shortness of breath/dyspnea on exertion? no   Orthopnea/PND? no  Edema? Yes - occasional LEE after strenuous exercise  Lightheadedness/dizziness? no  Daily weights at home? yes  Blood pressure/heart rate monitoring at home? no  Following low-sodium/fluid-restricted diet? yes   HF Medications: -Carvedilol 12.5mg  BID -Bidil 1/2 tab (10-18.75mg ) TID -Losartan 50mg  daily -Spironolactone 25mg  daily -Torsemide 20mg  every other day  Has the patient been experiencing any side effects to the medications prescribed?  no  Does the patient have any problems obtaining medications due to transportation or finances?   Yes - colchicine no longer covered on [employer]  Understanding of regimen: fair Understanding of indications: good Potential of compliance: good Patient understands to avoid NSAIDs. Patient understands to avoid decongestants.   Pertinent Lab Values:  2/11 BMET: SCr 1.29mg /dl, Na 161WRUE/A, K 5.4UJWJ/X, CO2 10mmol/L  Vital  Signs:  Weight: 320.4 lbs (last weight 306 lbs)  Blood pressure: 102/70 mmHg   Heart rate: 75 bpm   Assessment: 1. Combined Systolic/Diastolic Heart Failure- ECHO 02/22/2017 EF 40-45% Grade II DD. Peak PA pressure 46 mmhg. Echo9/18 EF ~25-30% (formal read 20-25%) -Pt appears euvolemic on exam despite weight gain (likely diet related), no edema or SOB/DOE.  -Given borderline soft BP will cautiously uptitrate carvedilol to 12.5mg  qAM + 18.75mg  qPM. If he tolerates this well increase to 18.75mg  BID at future visit -Continue Bidil 0.5 tablet BID, losartan 50mg  daily, spironolactone 25mg  daily, and torsemide every other day. -Will avoid transitioning losartan back to [facility] for now with borderline soft BP. -ECHO in 1 month with MD -Basic disease state pathophysiology, medication indication, mechanism and side effects reviewed at length with patient and he verbalized understanding  2. Sleep Apnea -Sleep study positive -Starting CPAP, has not used it yet but has paid for itand plan to pick it up soon- he states he is still waiting to pick it up, encouraged to do this soon  3. Morbid Obesity:Body mass index is 46.44 kg/m. -Actively trying to lose weight.  -Increasing exercise regimen with personal trainer and eating more per their recommendations  4. Gout -Continue allopurinol -Pt's insurance no longer covers colchicine - will attempt to submit PA  5. PVCs - ? If cardiomyopathy PVC-related. 24-hour monitor 7/18 PVCs 6%   Plan: 1) Medication changes: Based on clinical presentation, vital signs and recent labs will increase carvedilol to 12.5mg  qAM + 18.75mg  qPM 2) Labs: BMET today and at F/U visit 3) Follow-up: MD visit and repeat ECHO in 1 month  Fredonia Highland, PharmD, BCPS PGY-2 Cardiology Pharmacy Resident Pager: [phone] 12/25/2017  Patient seen with resident. Agree with above.  Bradley Powell.  Bonnye Fava, PharmD, BCPS, CPP Clinical Pharmacist Phone:  [phone] 12/25/2017 4:37 PM

## 2017-12-25 NOTE — Patient Instructions (Signed)
It was great to meet you.   Increase your carvedilol to 12.5mg  (1 tablet) in the morning and 18.75mg  (1 and 1/2 tablets) in the evening.  Return to clinic for follow-up with Dr. [name] in March.

## 2017-12-29 ENCOUNTER — Telehealth (HOSPITAL_COMMUNITY): Payer: Self-pay | Admitting: Pharmacist

## 2017-12-29 ENCOUNTER — Other Ambulatory Visit (HOSPITAL_COMMUNITY): Payer: Self-pay | Admitting: Pharmacist

## 2017-12-29 MED ORDER — COLCRYS 0.6 MG PO TABS: 0.6000 mg | ORAL_TABLET | Freq: Every day | ORAL | 5 refills | Status: DC | PRN

## 2017-12-29 NOTE — Telephone Encounter (Signed)
[employer] plan prefers brand name Colcrys to colchicine (denied PA for generic). Will send new Rx for brand Colcrys.   [name], PharmD, BCPS, CPP Clinical Pharmacist Phone: [phone] 12/29/2017 10:38 AM

## 2018-02-07 ENCOUNTER — Encounter (HOSPITAL_COMMUNITY): Payer: Self-pay | Admitting: Internal Medicine

## 2018-02-07 ENCOUNTER — Ambulatory Visit (HOSPITAL_BASED_OUTPATIENT_CLINIC_OR_DEPARTMENT_OTHER)
Admission: RE | Admit: 2018-02-07 | Discharge: 2018-02-07 | Disposition: A | Discharge status: Home / Self Care | Payer: BLUE CROSS/BLUE SHIELD | Source: Ambulatory Visit | Attending: Internal Medicine | Admitting: Internal Medicine

## 2018-02-07 ENCOUNTER — Other Ambulatory Visit: Payer: Self-pay

## 2018-02-07 ENCOUNTER — Ambulatory Visit (HOSPITAL_COMMUNITY)
Admission: RE | Admit: 2018-02-07 | Discharge: 2018-02-07 | Disposition: A | Discharge status: Home / Self Care | Payer: BLUE CROSS/BLUE SHIELD | Source: Ambulatory Visit | Attending: Internal Medicine | Admitting: Internal Medicine

## 2018-02-07 VITALS — BP 113/71 | HR 81 | Wt 326.0 lb

## 2018-02-07 DIAGNOSIS — R000 Tachycardia, unspecified: Secondary | ICD-10-CM

## 2018-02-07 DIAGNOSIS — I5022 Chronic systolic (congestive) heart failure: Secondary | ICD-10-CM | POA: Insufficient documentation

## 2018-02-07 DIAGNOSIS — I4430 Unspecified atrioventricular block: Secondary | ICD-10-CM | POA: Insufficient documentation

## 2018-02-07 DIAGNOSIS — I5042 Chronic combined systolic (congestive) and diastolic (congestive) heart failure: Secondary | ICD-10-CM | POA: Diagnosis not present

## 2018-02-07 DIAGNOSIS — I361 Nonrheumatic tricuspid (valve) insufficiency: Secondary | ICD-10-CM | POA: Diagnosis not present

## 2018-02-07 DIAGNOSIS — I071 Rheumatic tricuspid insufficiency: Secondary | ICD-10-CM | POA: Insufficient documentation

## 2018-02-07 DIAGNOSIS — I4892 Unspecified atrial flutter: Secondary | ICD-10-CM

## 2018-02-07 LAB — BASIC METABOLIC PANEL
ANION GAP: 11 (ref 5–15)
BUN: 15 mg/dL (ref 6–20)
CALCIUM: 9.3 mg/dL (ref 8.9–10.3)
CO2: 26 mmol/L (ref 22–32)
Chloride: 101 mmol/L (ref 101–111)
Creatinine, Ser: 1.27 mg/dL — ABNORMAL HIGH (ref 0.61–1.24)
GFR calc Af Amer: >60 mL/min (ref >60)
GLUCOSE: 111 mg/dL — AB (ref 65–99)
Potassium: 4 mmol/L (ref 3.5–5.1)
Sodium: 138 mmol/L (ref 135–145)

## 2018-02-07 MED ORDER — CARVEDILOL 12.5 MG PO TABS: 18.7500 mg | ORAL_TABLET | Freq: Two times a day (BID) | ORAL | 5 refills | Status: DC

## 2018-02-07 MED ORDER — APIXABAN 5 MG PO TABS: 5.0000 mg | ORAL_TABLET | Freq: Two times a day (BID) | ORAL | 6 refills | Status: DC

## 2018-02-07 NOTE — Progress Notes (Signed)
Advanced Heart Failure Clinic Note   PCP: Dr Nehemiah Settle  Primary HF Cardiologist: Dr [name]  HPI: Bradley Powell is a 37 y.o. male with a history of combined systolic/diastolic heart failure,  obesity, HTN, and gout.   Admitted 4/13 through 03/07/17 with marked volume overload. EF 40-45%. Diuresed with IV lasix tid + metolazone. Had RHC/LHC as noted below. Overall diuresed 45 pounds. On the day of discharge carvedilol was restarted. Discharge weight was 281 pounds.   Today he returns for HF follow up. Last visit his carvedilol was increased. He had an echo today that shows an improved EF of 45-50%. Doing well but says he is mildly SOB with exercise and carrying heaving items. Occasional edema on off day of torsemide. Energy level is good. No orthopnea or PND. No CP, palpitations, or dizziness. Wearing CPAP qHS. He has gained weight, but he is eating more. Compliant with medications.   Echo 9/18 EF ~25-30%.  Echo 3/19: EF 45-50%, reviewed by Dr [name]  Holter 7/18: 6% PVCs  Sleep study AHI 26/hr  RHC/LHC 03/01/2017  AO = 109/86 (97) LV =  101/28 RA =  29 RV = 50/22 PA = 55/24 (38) PCW = 26 Fick cardiac output/index = 6.4/2.6 Thermal CO/CI = 4.9/2.0 PVR = 2.5 WU FA sat = 98% PA sat = 69%, 71% SVC sat = 82%, 82% IVC sat = 66% Assessment: 1. Normal coronary arteries  2. Mild to moderate pulmonary HTN with normal PVR 3. Biventricular volume overload with R > L heart failure 4. Moderately decreased CO by thermodilution  5. 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 No intra-cardiac shunt  Review of systems complete and found to be negative unless listed in HPI.    SH:  Social History   Socioeconomic History  . Marital status: Single    Spouse name: Not on file  . Number of children: Not on file  . Years of education: Not on file  . Highest education level: Not on file  Occupational  History  . Not on file  Social Needs  . Financial resource strain: Not on file  . Food insecurity:    Worry: Not on file    Inability: Not on file  . Transportation needs:    Medical: Not on file    Non-medical: Not on file  Tobacco Use  . Smoking status: Never Smoker  . Smokeless tobacco: Never Used  Substance and Sexual Activity  . Alcohol use: Yes    Comment: rarely  . Drug use: No  . Sexual activity: Not on file  Lifestyle  . Physical activity:    Days per week: Not on file    Minutes per session: Not on file  . Stress: Not on file  Relationships  . Social connections:    Talks on phone: Not on file    Gets together: Not on file    Attends religious service: Not on file    Active member of club or organization: Not on file    Attends meetings of clubs or organizations: Not on file    Relationship status: Not on file  . Intimate partner violence:    Fear of current or ex partner: Not on file    Emotionally abused: Not on file    Physically abused: Not on file    Forced sexual activity: Not on file  Other Topics Concern  . Not on file  Social History Narrative  . Not on file    FH:  Family History  Problem Relation Age of Onset  . CVA Mother        pacemaker  . Multiple sclerosis Mother   . Seizures Mother   . Hypertension Father   . Gout Father     Past Medical History:  Diagnosis Date  . History of esophagogastroduodenoscopy (EGD)     Current Outpatient Medications  Medication Sig Dispense Refill  . acetaminophen (TYLENOL) 500 MG tablet Take 500 mg by mouth every 6 (six) hours as needed (pain).    Marland Kitchen allopurinol (ZYLOPRIM) 100 MG tablet TAKE TWO (2) TABLETS BY MOUTH DAILY 60 tablet 11  . carvedilol (COREG) 12.5 MG tablet Take 1 tablet (12.5mg ) in the morning and 1 and 1/2 tablets (18.75mg ) in the evening. 75 tablet 5  . COLCRYS 0.6 MG tablet Take 1 tablet (0.6 mg total) by mouth daily as needed (gout flare). 30 tablet 5  . fexofenadine (ALLEGRA) 30  MG tablet Take 180 mg by mouth as needed.     . isosorbide-hydrALAZINE (BIDIL) 20-37.5 MG tablet Take 0.5 tablets by mouth 3 (three) times daily. 45 tablet 5  . losartan (COZAAR) 50 MG tablet Take 1 tablet (50 mg total) at bedtime by mouth. 30 tablet 6  . spironolactone (ALDACTONE) 25 MG tablet Take 1 tablet (25 mg total) by mouth at bedtime. 30 tablet 3  . torsemide (DEMADEX) 20 MG tablet Take 20 mg by mouth every other day.     No current facility-administered medications for this encounter.    Vitals:   02/07/18 1453  BP: 113/71  Pulse: 81  SpO2: 98%  Weight: (!) 326 lb (147.9 kg)   Wt Readings from Last 3 Encounters:  02/07/18 (!) 326 lb (147.9 kg)  12/25/17 (!) 320 lb 6.4 oz (145.3 kg)  11/22/17 (!) 306 lb 8 oz (139 kg)   PHYSICAL EXAM: General:Obese,  No resp difficulty. HEENT: Normal anicteric Neck: Supple. JVP 5-6. Carotids 2+ bilat; no bruits. No thyromegaly or nodule noted. Cor: PMI nondisplaced. irregualr, No M/G/R noted Lungs: CTAB, normal effort. No wheeze Abdomen: obese, soft, non-tender, non-distended, no HSM. No bruits or masses. +BS  Extremities: No cyanosis, clubbing, or rash. R and LLE no edema. Warm  Neuro: Alert & orientedx3, cranial nerves grossly intact. moves all 4 extremities w/o difficulty. Affect pleasant  EKG: atrial flutter, 77 bpm  ASSESSMENT & PLAN: 1. Combined Systolic/Diastolic Heart Failure- ECHO 02/22/2017 EF 40-45% Grade II DD. Peak PA pressure 46 mmhg. Echo 9/18 EF ~20-25%. Echo today: EF 45-50% (reviewed by Dr [name]) NYHA II.  - Volume status stable. Continue torsemide 20 mg every other day.  - Increase carvedilol to 18.75 mg BID - Continue spiro 25 mg daily. Check BMET today. - Continue Bidil 0.5 tab TID - Continue losartan to 50 mg qhs. Will not rechallenge with Entresto just yet given previous severe orthostasis and nausea - Too large for cMRI.  - May get PYP scan in future 2. Sleep Apnea - Continue CPAP 3. Morbid Obesity: -  Body mass index is 49.57 kg/m.   4. Gout - Continue allopurinol 5. PVCs  - ? If cardiomyopathy PVC-related. 24-hour monitor  7/18  PVCs 6% - NSR with occasional blocked PAC's and occasional PVCs on Holter 6. Atrial flutter - He is asymptomatic. No hx of afib or atrial flutter - EKG today shows atrial flutter, 77 bpm - Start eliquis 5 mg BID.  - Refer to Dr  Ladona Ridgel for ablation +/- DC-CV   Alford Highland, NP  3:13 PM  Patient seen and examined with the above-signed Advanced Practice Provider and/or Housestaff. I personally reviewed laboratory data, imaging studies and relevant notes. I independently examined the patient and formulated the important aspects of the plan. I have edited the note to reflect any of my changes or salient points. I have personally discussed the plan with the patient and/or family.  Doing well from HF standpoint. Echo reviewed personally today and EF 45-50%. Volume status looks good despite weight gain. Will increase carvedilol. Incidental finding of new-onset AFL today. This is rate-controlled and asymptomatic. Will start Eliquis. Refer to Dr. Ladona Ridgel to discuss ablation. Encouraged him to continue to be compliant with CPAP.   Arvilla Meres, MD  10:38 PM

## 2018-02-07 NOTE — Progress Notes (Signed)
  Echocardiogram 2D Echocardiogram has been performed.  Celene Skeen 02/07/2018, 2:56 PM

## 2018-02-07 NOTE — Patient Instructions (Signed)
Start Eliquis 5 mg Twice daily   Increase Carvedilol to 18.75 mg (1 & 1/2 tabs) Twice daily   Lab today  You have been referred to Dr Ladona Ridgel at [facility], his office will call you for an appointment: [address] [place] [address] [phone]  Your physician recommends that you schedule a follow-up appointment in: 2 months

## 2018-02-21 ENCOUNTER — Encounter: Payer: Self-pay | Admitting: Internal Medicine

## 2018-02-28 ENCOUNTER — Encounter: Payer: Self-pay | Admitting: Internal Medicine

## 2018-02-28 ENCOUNTER — Ambulatory Visit (INDEPENDENT_AMBULATORY_CARE_PROVIDER_SITE_OTHER): Payer: BLUE CROSS/BLUE SHIELD | Admitting: Internal Medicine

## 2018-02-28 VITALS — BP 100/70 | HR 76 | Ht 68.0 in | Wt 335.0 lb

## 2018-02-28 DIAGNOSIS — I4892 Unspecified atrial flutter: Secondary | ICD-10-CM | POA: Diagnosis not present

## 2018-02-28 NOTE — Progress Notes (Signed)
HPI Bradley Powell is referred today by Dr. Dorthea Cove to consider catheter ablation. He is a pleasant 37 yo man with chronic systolic heart failure, morbid obesity, sleep apnea, and atrial flutter. He has been nicely rate controlled but continues to have atrial flutter. He has been anti-coagulated with Eliquis. He denies chest pain. He has minimal palpitations.   No Known Allergies   Current Outpatient Medications  Medication Sig Dispense Refill  . acetaminophen (TYLENOL) 500 MG tablet Take 500 mg by mouth every 6 (six) hours as needed (pain).    Marland Kitchen allopurinol (ZYLOPRIM) 100 MG tablet TAKE TWO (2) TABLETS BY MOUTH DAILY 60 tablet 11  . apixaban (ELIQUIS) 5 MG TABS tablet Take 1 tablet (5 mg total) by mouth 2 (two) times daily. 60 tablet 6  . carvedilol (COREG) 12.5 MG tablet Take 1.5 tablets (18.75 mg total) by mouth 2 (two) times daily with a meal. 90 tablet 5  . COLCRYS 0.6 MG tablet Take 1 tablet (0.6 mg total) by mouth daily as needed (gout flare). 30 tablet 5  . fexofenadine (ALLEGRA) 30 MG tablet Take 180 mg by mouth as needed.     . isosorbide-hydrALAZINE (BIDIL) 20-37.5 MG tablet Take 0.5 tablets by mouth 3 (three) times daily. 45 tablet 5  . losartan (COZAAR) 50 MG tablet Take 1 tablet (50 mg total) at bedtime by mouth. 30 tablet 6  . spironolactone (ALDACTONE) 25 MG tablet Take 1 tablet (25 mg total) by mouth at bedtime. 30 tablet 3  . torsemide (DEMADEX) 20 MG tablet Take 20 mg by mouth every other day.     No current facility-administered medications for this visit.      Past Medical History:  Diagnosis Date  . History of esophagogastroduodenoscopy (EGD)     ROS:   All systems reviewed and negative except as noted in the HPI.   Past Surgical History:  Procedure Laterality Date  . NO PAST SURGERIES    . RIGHT/LEFT HEART CATH AND CORONARY ANGIOGRAPHY N/A 03/01/2017   Procedure: Right/Left Heart Cath and Coronary Angiography;  Surgeon: Dolores Patty, MD;  Location:  [facility] INVASIVE CV LAB;  Service: Cardiovascular;  Laterality: N/A;     Family History  Problem Relation Age of Onset  . CVA Mother        pacemaker  . Multiple sclerosis Mother   . Seizures Mother   . Hypertension Father   . Gout Father      Social History   Socioeconomic History  . Marital status: Single    Spouse name: Not on file  . Number of children: Not on file  . Years of education: Not on file  . Highest education level: Not on file  Occupational History  . Not on file  Social Needs  . Financial resource strain: Not on file  . Food insecurity:    Worry: Not on file    Inability: Not on file  . Transportation needs:    Medical: Not on file    Non-medical: Not on file  Tobacco Use  . Smoking status: Never Smoker  . Smokeless tobacco: Never Used  Substance and Sexual Activity  . Alcohol use: Yes    Comment: rarely  . Drug use: No  . Sexual activity: Not on file  Lifestyle  . Physical activity:    Days per week: Not on file    Minutes per session: Not on file  . Stress: Not on file  Relationships  . Social  connections:    Talks on phone: Not on file    Gets together: Not on file    Attends religious service: Not on file    Active member of club or organization: Not on file    Attends meetings of clubs or organizations: Not on file    Relationship status: Not on file  . Intimate partner violence:    Fear of current or ex partner: Not on file    Emotionally abused: Not on file    Physically abused: Not on file    Forced sexual activity: Not on file  Other Topics Concern  . Not on file  Social History Narrative  . Not on file     BP 100/70   Pulse 76   Ht 5\' 8"  (1.727 m)   Wt (!) 335 lb (152 kg)   BMI 50.94 kg/m   Physical Exam:  Well appearing 37 yo man, NAD HEENT: Unremarkable Neck:  7 cm JVD, no thyromegally Lymphatics:  No adenopathy Back:  No CVA tenderness Lungs:  Clear with basilar rales HEART:  IRegular rate rhythm, no murmurs, no  rubs, no clicks Abd:  soft, positive bowel sounds, no organomegally, no rebound, no guarding Ext:  2 plus pulses, no edema, no cyanosis, no clubbing Skin:  No rashes no nodules Neuro:  CN II through XII intact, motor grossly intact  EKG - atrial flutter with a CVR  Assess/Plan: 1. Atrial flutter - the patient's ventricular rate is controlled and he is on systemic anti-coagulation. I have discussed the indications/risk/benefits/goals/expectations of ablation and he will call us if he wishes to proceed.  2. Chronic systolic heart failure - I would expect his CHF symptoms to improve after ablation.  3. Obesity - he has not been able to work out with his atrial flutter. Hopefully after ablation he will be able to lose weight. 4. Sleep apnea - he appears to be tolerating his CPAP. Will follow.  Leonia Reeves.D.

## 2018-02-28 NOTE — Patient Instructions (Addendum)
Medication Instructions:  Your physician recommends that you continue on your current medications as directed. Please refer to the Current Medication list given to you today.  Labwork: None ordered.  Testing/Procedures: Your physician has recommended that you have an ablation. Catheter ablation is a medical procedure used to treat some cardiac arrhythmias (irregular heartbeats). During catheter ablation, a long, thin, flexible tube is put into a blood vessel in your groin (upper thigh), or neck. This tube is called an ablation catheter. It is then guided to your heart through the blood vessel. Radio frequency waves destroy small areas of heart tissue where abnormal heartbeats may cause an arrhythmia to start. Please see the instruction sheet given to you today.  Follow-Up:  The following days are available for procedures:  Ablation days: April  22, 23, 24 May 6, 8, 9, 22, 24,   If you decide on a day please give me a call:  Dierdre Highman [phone]   Any Other Special Instructions Will Be Listed Below (If Applicable).  If you need a refill on your cardiac medications before your next appointment, please call your pharmacy.

## 2018-03-05 ENCOUNTER — Other Ambulatory Visit (HOSPITAL_COMMUNITY): Payer: Self-pay | Admitting: Pharmacist

## 2018-03-05 MED ORDER — COLCHICINE 0.6 MG PO TABS: 0.6000 mg | ORAL_TABLET | Freq: Every day | ORAL | 5 refills | Status: DC | PRN

## 2018-03-12 ENCOUNTER — Other Ambulatory Visit (HOSPITAL_COMMUNITY): Payer: Self-pay | Admitting: Student

## 2018-03-12 ENCOUNTER — Other Ambulatory Visit (HOSPITAL_COMMUNITY): Payer: Self-pay | Admitting: Internal Medicine

## 2018-03-16 ENCOUNTER — Telehealth: Payer: Self-pay | Admitting: Internal Medicine

## 2018-03-16 DIAGNOSIS — I4892 Unspecified atrial flutter: Secondary | ICD-10-CM

## 2018-03-16 NOTE — Telephone Encounter (Signed)
Call returned to Pt.  Left nurse name and # for call back. Notified will call Monday if no return call today.

## 2018-03-16 NOTE — Telephone Encounter (Signed)
New Message   Patient is calling to set up appointment for ablation. Please call to discuss.

## 2018-03-19 NOTE — Telephone Encounter (Signed)
Call returned to Pt.  Pt would like to schedule ablation for Apr 06, 2018.  Advised Pt he would be scheduled to arrive at 9:30 am and may have to stay overnight.  Pt will return this nurse call if he would rather change to Apr 04, 2018. Will schedule for May 24 @ 11:30 am at this time. Pt to come for labwork/instruction letter on Apr 02, 2018. Instruction letter complete.   No further action at this time.

## 2018-04-02 ENCOUNTER — Other Ambulatory Visit (HOSPITAL_COMMUNITY): Payer: Self-pay | Admitting: Internal Medicine

## 2018-04-02 ENCOUNTER — Other Ambulatory Visit: Payer: BLUE CROSS/BLUE SHIELD

## 2018-04-04 ENCOUNTER — Other Ambulatory Visit: Payer: BLUE CROSS/BLUE SHIELD | Admitting: *Deleted

## 2018-04-04 DIAGNOSIS — I4892 Unspecified atrial flutter: Secondary | ICD-10-CM

## 2018-04-05 LAB — CBC WITH DIFFERENTIAL/PLATELET
BASOS: 0 %
Basophils Absolute: 0 10*3/uL (ref 0.0–0.2)
EOS (ABSOLUTE): 0 10*3/uL (ref 0.0–0.4)
Eos: 0 %
Hematocrit: 39.9 % (ref 37.5–51.0)
Hemoglobin: 12.6 g/dL — ABNORMAL LOW (ref 13.0–17.7)
IMMATURE GRANS (ABS): 0 10*3/uL (ref 0.0–0.1)
Immature Granulocytes: 0 %
LYMPHS ABS: 1.9 10*3/uL (ref 0.7–3.1)
Lymphs: 27 %
MCH: 28.1 pg (ref 26.6–33.0)
MCHC: 31.6 g/dL (ref 31.5–35.7)
MCV: 89 fL (ref 79–97)
Monocytes Absolute: 0.6 10*3/uL (ref 0.1–0.9)
Monocytes: 9 %
NEUTROS ABS: 4.5 10*3/uL (ref 1.4–7.0)
Neutrophils: 64 %
PLATELETS: 190 10*3/uL (ref 150–450)
RBC: 4.49 x10E6/uL (ref 4.14–5.80)
RDW: 14.6 % (ref 12.3–15.4)
WBC: 7.1 10*3/uL (ref 3.4–10.8)

## 2018-04-05 LAB — BASIC METABOLIC PANEL
BUN / CREAT RATIO: 11 (ref 9–20)
BUN: 14 mg/dL (ref 6–20)
CHLORIDE: 98 mmol/L (ref 96–106)
CO2: 22 mmol/L (ref 20–29)
Calcium: 9.3 mg/dL (ref 8.7–10.2)
Creatinine, Ser: 1.26 mg/dL (ref 0.76–1.27)
GFR calc non Af Amer: 74 mL/min/{1.73_m2} (ref >59)
GFR, EST AFRICAN AMERICAN: 85 mL/min/{1.73_m2} (ref >59)
Glucose: 239 mg/dL — ABNORMAL HIGH (ref 65–99)
Potassium: 4.4 mmol/L (ref 3.5–5.2)
SODIUM: 138 mmol/L (ref 134–144)

## 2018-04-06 ENCOUNTER — Ambulatory Visit (HOSPITAL_COMMUNITY)
Admission: RE | Admit: 2018-04-06 | Discharge: 2018-04-07 | Disposition: A | Discharge status: Home / Self Care | Payer: BLUE CROSS/BLUE SHIELD | Source: Ambulatory Visit | Attending: Internal Medicine | Admitting: Internal Medicine

## 2018-04-06 ENCOUNTER — Encounter (HOSPITAL_COMMUNITY): Payer: Self-pay

## 2018-04-06 ENCOUNTER — Other Ambulatory Visit: Payer: Self-pay

## 2018-04-06 ENCOUNTER — Encounter (HOSPITAL_COMMUNITY)
Admission: RE | Disposition: A | Discharge status: Home / Self Care | Payer: Self-pay | Source: Ambulatory Visit | Attending: Internal Medicine

## 2018-04-06 DIAGNOSIS — Z7901 Long term (current) use of anticoagulants: Secondary | ICD-10-CM | POA: Diagnosis not present

## 2018-04-06 DIAGNOSIS — I428 Other cardiomyopathies: Secondary | ICD-10-CM | POA: Insufficient documentation

## 2018-04-06 DIAGNOSIS — I110 Hypertensive heart disease with heart failure: Secondary | ICD-10-CM | POA: Insufficient documentation

## 2018-04-06 DIAGNOSIS — Z8249 Family history of ischemic heart disease and other diseases of the circulatory system: Secondary | ICD-10-CM | POA: Diagnosis not present

## 2018-04-06 DIAGNOSIS — R739 Hyperglycemia, unspecified: Secondary | ICD-10-CM | POA: Diagnosis not present

## 2018-04-06 DIAGNOSIS — I493 Ventricular premature depolarization: Secondary | ICD-10-CM | POA: Insufficient documentation

## 2018-04-06 DIAGNOSIS — G4733 Obstructive sleep apnea (adult) (pediatric): Secondary | ICD-10-CM | POA: Insufficient documentation

## 2018-04-06 DIAGNOSIS — Z6841 Body Mass Index (BMI) 40.0 and over, adult: Secondary | ICD-10-CM | POA: Diagnosis not present

## 2018-04-06 DIAGNOSIS — I4892 Unspecified atrial flutter: Secondary | ICD-10-CM | POA: Diagnosis present

## 2018-04-06 DIAGNOSIS — I5042 Chronic combined systolic (congestive) and diastolic (congestive) heart failure: Secondary | ICD-10-CM | POA: Diagnosis not present

## 2018-04-06 DIAGNOSIS — I440 Atrioventricular block, first degree: Secondary | ICD-10-CM | POA: Diagnosis not present

## 2018-04-06 DIAGNOSIS — I483 Typical atrial flutter: Secondary | ICD-10-CM

## 2018-04-06 DIAGNOSIS — I5043 Acute on chronic combined systolic (congestive) and diastolic (congestive) heart failure: Secondary | ICD-10-CM

## 2018-04-06 HISTORY — DX: Other cardiomyopathies: I42.8

## 2018-04-06 HISTORY — DX: Unspecified atrial flutter: I48.92

## 2018-04-06 HISTORY — PX: A-FLUTTER ABLATION: EP1230

## 2018-04-06 HISTORY — DX: Ventricular premature depolarization: I49.3

## 2018-04-06 HISTORY — DX: Morbid (severe) obesity due to excess calories: E66.01

## 2018-04-06 HISTORY — DX: Chronic combined systolic (congestive) and diastolic (congestive) heart failure: I50.42

## 2018-04-06 HISTORY — DX: Sleep apnea, unspecified: G47.30

## 2018-04-06 SURGERY — A-FLUTTER ABLATION

## 2018-04-06 MED ORDER — FENTANYL CITRATE (PF) 100 MCG/2ML IJ SOLN
INTRAMUSCULAR | Status: AC
  Filled 2018-04-06: qty 2

## 2018-04-06 MED ORDER — APIXABAN 5 MG PO TABS
5.0000 mg | ORAL_TABLET | Freq: Two times a day (BID) | ORAL | Status: DC
  Administered 2018-04-06: 5 mg via ORAL
  Filled 2018-04-06 (×2): qty 1

## 2018-04-06 MED ORDER — MIDAZOLAM HCL 5 MG/5ML IJ SOLN
INTRAMUSCULAR | Status: DC | PRN
  Administered 2018-04-06 (×5): 1 mg via INTRAVENOUS
  Administered 2018-04-06 (×2): 2 mg via INTRAVENOUS

## 2018-04-06 MED ORDER — SODIUM CHLORIDE 0.9% FLUSH: 3.0000 mL | INTRAVENOUS | Status: DC | PRN

## 2018-04-06 MED ORDER — LOSARTAN POTASSIUM 50 MG PO TABS
50.0000 mg | ORAL_TABLET | Freq: Every day | ORAL | Status: DC
  Administered 2018-04-06: 50 mg via ORAL
  Filled 2018-04-06 (×3): qty 1

## 2018-04-06 MED ORDER — SPIRONOLACTONE 25 MG PO TABS
25.0000 mg | ORAL_TABLET | Freq: Every day | ORAL | Status: DC
  Administered 2018-04-06: 25 mg via ORAL
  Filled 2018-04-06: qty 1

## 2018-04-06 MED ORDER — FENTANYL CITRATE (PF) 100 MCG/2ML IJ SOLN
INTRAMUSCULAR | Status: DC | PRN
  Administered 2018-04-06 (×2): 25 ug via INTRAVENOUS
  Administered 2018-04-06 (×5): 12.5 ug via INTRAVENOUS

## 2018-04-06 MED ORDER — BUPIVACAINE HCL (PF) 0.25 % IJ SOLN
INTRAMUSCULAR | Status: AC
  Filled 2018-04-06: qty 30

## 2018-04-06 MED ORDER — ISOSORB DINITRATE-HYDRALAZINE 20-37.5 MG PO TABS
0.5000 | ORAL_TABLET | Freq: Three times a day (TID) | ORAL | Status: DC
  Administered 2018-04-06 – 2018-04-07 (×3): 0.5 via ORAL
  Filled 2018-04-06 (×3): qty 1

## 2018-04-06 MED ORDER — CARVEDILOL 6.25 MG PO TABS
18.7500 mg | ORAL_TABLET | Freq: Two times a day (BID) | ORAL | Status: DC
  Administered 2018-04-06 – 2018-04-07 (×2): 18.75 mg via ORAL
  Filled 2018-04-06 (×2): qty 1

## 2018-04-06 MED ORDER — TORSEMIDE 20 MG PO TABS: 20.0000 mg | ORAL_TABLET | ORAL | Status: DC

## 2018-04-06 MED ORDER — ONDANSETRON HCL 4 MG/2ML IJ SOLN: 4.0000 mg | Freq: Four times a day (QID) | INTRAMUSCULAR | Status: DC | PRN

## 2018-04-06 MED ORDER — COLCHICINE 0.6 MG PO TABS: 0.6000 mg | ORAL_TABLET | Freq: Every day | ORAL | Status: DC | PRN

## 2018-04-06 MED ORDER — ACETAMINOPHEN 325 MG PO TABS: 650.0000 mg | ORAL_TABLET | ORAL | Status: DC | PRN

## 2018-04-06 MED ORDER — MIDAZOLAM HCL 5 MG/5ML IJ SOLN
INTRAMUSCULAR | Status: AC
  Filled 2018-04-06: qty 5

## 2018-04-06 MED ORDER — SODIUM CHLORIDE 0.9 % IV SOLN
INTRAVENOUS | Status: DC
  Administered 2018-04-06: 10:00:00 via INTRAVENOUS

## 2018-04-06 MED ORDER — ALLOPURINOL 100 MG PO TABS
50.0000 mg | ORAL_TABLET | Freq: Every day | ORAL | Status: DC
  Administered 2018-04-07: 50 mg via ORAL
  Filled 2018-04-06 (×2): qty 1

## 2018-04-06 MED ORDER — SODIUM CHLORIDE 0.9 % IV SOLN: 250.0000 mL | INTRAVENOUS | Status: DC | PRN

## 2018-04-06 MED ORDER — ACETAMINOPHEN 500 MG PO TABS: 1000.0000 mg | ORAL_TABLET | Freq: Four times a day (QID) | ORAL | Status: DC | PRN

## 2018-04-06 MED ORDER — BUPIVACAINE HCL (PF) 0.25 % IJ SOLN
INTRAMUSCULAR | Status: DC | PRN
  Administered 2018-04-06: 45 mL

## 2018-04-06 MED ORDER — SODIUM CHLORIDE 0.9% FLUSH
3.0000 mL | Freq: Two times a day (BID) | INTRAVENOUS | Status: DC
  Administered 2018-04-06 – 2018-04-07 (×2): 3 mL via INTRAVENOUS

## 2018-04-06 SURGICAL SUPPLY — 18 items
BAG SNAP BAND KOVER 36X36 (MISCELLANEOUS) ×2 IMPLANT
CATH DUODECA HALO/ISMUS 7FR (CATHETERS) ×1 IMPLANT
CATH EZ STEER NAV 8MM F-J CUR (ABLATOR) ×1 IMPLANT
CATH HEX JOSEPH 2-5-2 65CM 6F (CATHETERS) ×2 IMPLANT
CATH JOSEPHSON QUAD-ALLRED 6FR (CATHETERS) ×1 IMPLANT
CATH POLARIS X 2.5/5/2.5 DECAP (CATHETERS) ×2 IMPLANT
CATH WEB BIDIR CS D-F NONAUTO (CATHETERS) ×2 IMPLANT
HOVERMATT SINGLE USE (MISCELLANEOUS) IMPLANT
INTRODUCER SWARTZ SRO 8F (SHEATH) ×1 IMPLANT
PACK EP LATEX FREE (CUSTOM PROCEDURE TRAY) ×2
PACK EP LF (CUSTOM PROCEDURE TRAY) ×1 IMPLANT
PAD DEFIB LIFELINK (PAD) ×1 IMPLANT
PATCH CARTO3 (PAD) ×1 IMPLANT
SHEATH PINNACLE 6F 10CM (SHEATH) ×1 IMPLANT
SHEATH PINNACLE 7F 10CM (SHEATH) ×2 IMPLANT
SHEATH PINNACLE 8F 10CM (SHEATH) ×2 IMPLANT
SHEATH PINNACLE 9F 10CM (SHEATH) ×1 IMPLANT
SHIELD RADPAD SCOOP 12X17 (MISCELLANEOUS) ×1 IMPLANT

## 2018-04-06 NOTE — Progress Notes (Signed)
Site area: Right Internal Jugular a 7 french venous sheath.  Site Prior to Removal:  Level 0  Pressure Applied For10 MINUTES    Bedrest Beginning at 1615am  Manual:   Yes.    Patient Status During Pull:  stable  Post Pull Groin Site:  Level 0  Post Pull Instructions Given:  Yes.    Post Pull Pulses Present:  Yes.    Dressing Applied:  Yes.    Comments:  VS remain stable.

## 2018-04-06 NOTE — Discharge Summary (Addendum)
ELECTROPHYSIOLOGY PROCEDURE DISCHARGE SUMMARY    Patient ID: Bradley Powell,  MRN: [id number], DOB/AGE: [birth year] 37 y.o.  Admit date: 04/06/2018 Discharge date: 04/07/2018  Primary Care Physician: [name], MD Primary Cardiologist: Bensimhon Electrophysiologist: Ladona Ridgel  Primary Discharge Diagnosis:  Atrial flutter status post ablation this admission  Secondary Discharge Diagnosis:  1.  Chronic combined CHF/NICM 2.  Morbid obesity 3.  OSA 4. Hyperglycemia  Allergies  Allergen Reactions  . Entresto [Sacubitril-Valsartan] Nausea And Vomiting and Other (See Comments)    Lightheaded, fainting     Procedures This Admission: 1.  Electrophysiology study and radiofrequency catheter ablation on 04/06/18 by Dr Ladona Ridgel.  This study demonstrated typical atrial flutter with successful CTI ablation.  There were no inducible arrhythmias following ablation and no early apparent complications.   Brief HPI: Bradley Powell is a 37 y.o. male with chronic combined CHF/NICM (normal coronaries 2018), HTN, morbid obesity, OSA, and gout. He has been followed by the CHF team. Most recent EF 45-50%. He was found to have atrial flutter in 01/2018 that was persistent. He was referred to EP to consider flutter ablation and was felt to be a candidate for this procedure. He has been appropriately anticoagulated for >4 weeks.  Risks, benefits, and alternatives to ablation were reviewed with the patient who wished to proceed.   Hospital Course:  The patient was admitted and underwent EPS/RFCA with details as outlined above. He was monitored on telemetry overnight which demonstrated NSR with 1st degree AVB and PVCs.  Groin and neck incisions were without complication.  They were examined by Dr Ladona Ridgel who considered them stable for discharge to home.  Follow up will be arranged in 4 weeks. Dr. Ladona Ridgel recommended he hold his Eliquis today and start back tonight. Dr. Ladona Ridgel feels he will be able to  stop the Eliquis in a month if he is maintaining NSR.  He received instructions on wound care. Dr Ladona Ridgel felt he could return to work on Wednesday 5/29. He declined a work note.  Of note his blood sugar on pre-procedure labs was over 200 and he was advised to f/u PCP for this. I could not find a prior history of diabetes although see prior hyperglycemia noted in labs.  Physical Exam: Vitals:   04/06/18 2219 04/07/18 0651 04/07/18 0706 04/07/18 0845  BP: 110/83  (!) 142/92 (!) 142/86  Pulse:   98 99  Resp: (!) 24  (!) 22   Temp:   98.9 F (37.2 C)   TempSrc:   Oral   SpO2:      Weight:  (!) 327 lb 3.2 oz (148.4 kg)    Height:         Labs:   Lab Results  Component Value Date   WBC 7.1 04/04/2018   HGB 12.6 (L) 04/04/2018   HCT 39.9 04/04/2018   MCV 89 04/04/2018   PLT 190 04/04/2018    Recent Labs  Lab 04/04/18 1253  NA 138  K 4.4  CL 98  CO2 22  BUN 14  CREATININE 1.26  CALCIUM 9.3  GLUCOSE 239*    Discharge Medications:  Allergies as of 04/07/2018      Reactions   Entresto [sacubitril-valsartan] Nausea And Vomiting, Other (See Comments)   Lightheaded, fainting      Medication List    TAKE these medications   acetaminophen 500 MG tablet Commonly known as:  TYLENOL Take 1,000 mg by mouth every 6 (six) hours as needed for moderate pain  or headache.   allopurinol 100 MG tablet Commonly known as:  ZYLOPRIM TAKE TWO (2) TABLETS BY MOUTH DAILY   apixaban 5 MG Tabs tablet Commonly known as:  ELIQUIS Take 1 tablet (5 mg total) by mouth 2 (two) times daily. -> advised to start back tonight both verbally and on AVS   carvedilol 12.5 MG tablet Commonly known as:  COREG Take 1.5 tablets (18.75 mg total) by mouth 2 (two) times daily with a meal.   colchicine 0.6 MG tablet Commonly known as:  COLCRYS Take 1 tablet (0.6 mg total) by mouth daily as needed (gout flare).   isosorbide-hydrALAZINE 20-37.5 MG tablet Commonly known as:  BIDIL Take 0.5 tablets by  mouth 3 (three) times daily.   losartan 50 MG tablet Commonly known as:  COZAAR TAKE ONE (1) TABLET BY MOUTH EVERY NIGHT AT BEDTIME   spironolactone 25 MG tablet Commonly known as:  ALDACTONE TAKE ONE TABLET BY MOUTH EVERY NIGHT AT BEDTIME   torsemide 20 MG tablet Commonly known as:  DEMADEX Take 1 tablet (20 mg total) by mouth every other day.       Disposition:  Discharge Instructions    Diet - low sodium heart healthy   Complete by:  As directed    Discharge instructions   Complete by:  As directed    Dr. Ladona Ridgel recommends you not take any Eliquis this morning, and start it back tonight. He encourages you to start walking tomorrow. You may return to work on Wednesday 04/11/18.  No driving for 2 days. No lifting over 5 lbs for 1 week. No sexual activity for 1 week. Keep procedure site clean & dry. If you notice increased pain, swelling, bleeding or pus, call/return!  You may shower, but no soaking baths/hot tubs/pools for 1 week.   Increase activity slowly   Complete by:  As directed      Follow-up Information    Marinus Maw, MD Follow up on 05/04/2018.   Specialty:  Cardiology Why:  at [facility]  Contact information: [address] [place] [address] [phone]        [name], MD Follow up.   Specialty:  Internal Medicine Why:  Your blood sugar was elevated on pre-procedure labs 5/22 (239). Please follow up with primary care to address. Contact information: [address] [place] [address] [phone]           Duration of Discharge Encounter: Greater than 30 minutes including physician time.  Signed, Gypsy Balsam, NP 04/06/2018 9:53 AM  Previously started discharge summary was addended/updated on day of discharge to reflect most current information at time of discharge. Kriste Basque Dunn PA-C 04/07/2018 9:53 AM   EP Attending  Patient seen and examined. Agree with the findings as above. He is stable for DC  home. Usual followup.  Leonia Reeves.D.

## 2018-04-06 NOTE — Progress Notes (Signed)
Site area: Right groin a  6,9, and 9 french venous sheath was removed   Site Prior to Removal:  Level 0  Pressure Applied For 20 MINUTES    Bedrest Beginning at 1615am Manual:   Yes.    Patient Status During Pull:  stable  Post Pull Groin Site:  Level 0  Post Pull Instructions Given:  Yes.    Post Pull Pulses Present:  Yes.    Dressing Applied:  Yes.    Comments:  VS remain stable

## 2018-04-06 NOTE — H&P (Signed)
HPI Bradley Powell is referred today by Dr. Dorthea Cove to consider catheter ablation. He is a pleasant 37 yo man with chronic systolic heart failure, morbid obesity, sleep apnea, and atrial flutter. He has been nicely rate controlled but continues to have atrial flutter. He has been anti-coagulated with Eliquis. He denies chest pain. He has minimal palpitations.   No Known Allergies         Current Outpatient Medications  Medication Sig Dispense Refill  . acetaminophen (TYLENOL) 500 MG tablet Take 500 mg by mouth every 6 (six) hours as needed (pain).    Marland Kitchen allopurinol (ZYLOPRIM) 100 MG tablet TAKE TWO (2) TABLETS BY MOUTH DAILY 60 tablet 11  . apixaban (ELIQUIS) 5 MG TABS tablet Take 1 tablet (5 mg total) by mouth 2 (two) times daily. 60 tablet 6  . carvedilol (COREG) 12.5 MG tablet Take 1.5 tablets (18.75 mg total) by mouth 2 (two) times daily with a meal. 90 tablet 5  . COLCRYS 0.6 MG tablet Take 1 tablet (0.6 mg total) by mouth daily as needed (gout flare). 30 tablet 5  . fexofenadine (ALLEGRA) 30 MG tablet Take 180 mg by mouth as needed.     . isosorbide-hydrALAZINE (BIDIL) 20-37.5 MG tablet Take 0.5 tablets by mouth 3 (three) times daily. 45 tablet 5  . losartan (COZAAR) 50 MG tablet Take 1 tablet (50 mg total) at bedtime by mouth. 30 tablet 6  . spironolactone (ALDACTONE) 25 MG tablet Take 1 tablet (25 mg total) by mouth at bedtime. 30 tablet 3  . torsemide (DEMADEX) 20 MG tablet Take 20 mg by mouth every other day.     No current facility-administered medications for this visit.          Past Medical History:  Diagnosis Date  . History of esophagogastroduodenoscopy (EGD)     ROS:   All systems reviewed and negative except as noted in the HPI.        Past Surgical History:  Procedure Laterality Date  . NO PAST SURGERIES    . RIGHT/LEFT HEART CATH AND CORONARY ANGIOGRAPHY N/A 03/01/2017   Procedure: Right/Left Heart Cath and Coronary Angiography;  Surgeon:  Dolores Patty, MD;  Location: [facility] INVASIVE CV LAB;  Service: Cardiovascular;  Laterality: N/A;          Family History  Problem Relation Age of Onset  . CVA Mother        pacemaker  . Multiple sclerosis Mother   . Seizures Mother   . Hypertension Father   . Gout Father      Social History        Socioeconomic History  . Marital status: Single    Spouse name: Not on file  . Number of children: Not on file  . Years of education: Not on file  . Highest education level: Not on file  Occupational History  . Not on file  Social Needs  . Financial resource strain: Not on file  . Food insecurity:    Worry: Not on file    Inability: Not on file  . Transportation needs:    Medical: Not on file    Non-medical: Not on file  Tobacco Use  . Smoking status: Never Smoker  . Smokeless tobacco: Never Used  Substance and Sexual Activity  . Alcohol use: Yes    Comment: rarely  . Drug use: No  . Sexual activity: Not on file  Lifestyle  . Physical activity:    Days per week: Not on  file    Minutes per session: Not on file  . Stress: Not on file  Relationships  . Social connections:    Talks on phone: Not on file    Gets together: Not on file    Attends religious service: Not on file    Active member of club or organization: Not on file    Attends meetings of clubs or organizations: Not on file    Relationship status: Not on file  . Intimate partner violence:    Fear of current or ex partner: Not on file    Emotionally abused: Not on file    Physically abused: Not on file    Forced sexual activity: Not on file  Other Topics Concern  . Not on file  Social History Narrative  . Not on file     BP 100/70   Pulse 76   Ht 5\' 8"  (1.727 m)   Wt (!) 335 lb (152 kg)   BMI 50.94 kg/m   Physical Exam:  Well appearing 37 yo man, NAD HEENT: Unremarkable Neck:  7 cm JVD, no thyromegally Lymphatics:  No  adenopathy Back:  No CVA tenderness Lungs:  Clear with basilar rales HEART:  IRegular rate rhythm, no murmurs, no rubs, no clicks Abd:  soft, positive bowel sounds, no organomegally, no rebound, no guarding Ext:  2 plus pulses, no edema, no cyanosis, no clubbing Skin:  No rashes no nodules Neuro:  CN II through XII intact, motor grossly intact  EKG - atrial flutter with a CVR  Assess/Plan: 1. Atrial flutter - the patient's ventricular rate is controlled and he is on systemic anti-coagulation. I have discussed the indications/risk/benefits/goals/expectations of ablation and he will call us if he wishes to proceed.  2. Chronic systolic heart failure - I would expect his CHF symptoms to improve after ablation.  3. Obesity - he has not been able to work out with his atrial flutter. Hopefully after ablation he will be able to lose weight. 4. Sleep apnea - he appears to be tolerating his CPAP. Will follow.  Gerhard Munch.  EP Attending  Patient seen and examined. Agree with above. No change since last clinic visit. We will proceed with EP study and catheter ablation and he wishes to proceed.  Leonia Reeves.D.

## 2018-04-07 ENCOUNTER — Encounter (HOSPITAL_COMMUNITY): Payer: Self-pay | Admitting: Physician Assistant

## 2018-04-07 DIAGNOSIS — Z7901 Long term (current) use of anticoagulants: Secondary | ICD-10-CM | POA: Diagnosis not present

## 2018-04-07 DIAGNOSIS — Z8249 Family history of ischemic heart disease and other diseases of the circulatory system: Secondary | ICD-10-CM | POA: Diagnosis not present

## 2018-04-07 DIAGNOSIS — I4892 Unspecified atrial flutter: Secondary | ICD-10-CM | POA: Diagnosis not present

## 2018-04-07 DIAGNOSIS — I110 Hypertensive heart disease with heart failure: Secondary | ICD-10-CM | POA: Diagnosis not present

## 2018-04-07 DIAGNOSIS — G4733 Obstructive sleep apnea (adult) (pediatric): Secondary | ICD-10-CM | POA: Diagnosis not present

## 2018-04-07 DIAGNOSIS — I428 Other cardiomyopathies: Secondary | ICD-10-CM

## 2018-04-07 DIAGNOSIS — I5042 Chronic combined systolic (congestive) and diastolic (congestive) heart failure: Secondary | ICD-10-CM

## 2018-04-07 DIAGNOSIS — I5043 Acute on chronic combined systolic (congestive) and diastolic (congestive) heart failure: Secondary | ICD-10-CM

## 2018-04-07 DIAGNOSIS — I493 Ventricular premature depolarization: Secondary | ICD-10-CM | POA: Diagnosis not present

## 2018-04-07 DIAGNOSIS — I440 Atrioventricular block, first degree: Secondary | ICD-10-CM | POA: Diagnosis not present

## 2018-04-07 DIAGNOSIS — R739 Hyperglycemia, unspecified: Secondary | ICD-10-CM

## 2018-04-07 DIAGNOSIS — Z6841 Body Mass Index (BMI) 40.0 and over, adult: Secondary | ICD-10-CM | POA: Diagnosis not present

## 2018-04-07 DIAGNOSIS — I483 Typical atrial flutter: Secondary | ICD-10-CM | POA: Diagnosis not present

## 2018-04-07 NOTE — Progress Notes (Signed)
Patient states he was told by Dr. Ladona Ridgel that he should not take his eliquis this morning. RN verbally confirmed this with Dr. Ladona Ridgel as orders for eliquis are in the [facility] for this morning. Dr. Ladona Ridgel confirmed that patient should not take eliquis until tonight.

## 2018-04-07 NOTE — Discharge Instructions (Signed)
No driving for 4 days. No lifting over 5 lbs for 1 week. No sexual activity for 1 week. You may return to work in 1 week. Keep procedure site clean & dry. If you notice increased pain, swelling, bleeding or pus, call/return!  You may shower, but no soaking baths/hot tubs/pools for 1 week.    Heart-Healthy Eating Plan Heart-healthy meal planning includes:  Limiting unhealthy fats.  Increasing healthy fats.  Making other small dietary changes.  You may need to talk with your doctor or a diet specialist (dietitian) to create an eating plan that is right for you. What types of fat should I choose?  Choose healthy fats. These include olive oil and canola oil, flaxseeds, walnuts, almonds, and seeds.  Eat more omega-3 fats. These include salmon, mackerel, sardines, tuna, flaxseed oil, and ground flaxseeds. Try to eat fish at least twice each week.  Limit saturated fats. ? Saturated fats are often found in animal products, such as meats, butter, and cream. ? Plant sources of saturated fats include palm oil, palm kernel oil, and coconut oil.  Avoid foods with partially hydrogenated oils in them. These include stick margarine, some tub margarines, cookies, crackers, and other baked goods. These contain trans fats. What general guidelines do I need to follow?  Check food labels carefully. Identify foods with trans fats or high amounts of saturated fat.  Fill one half of your plate with vegetables and green salads. Eat 4-5 servings of vegetables per day. A serving of vegetables is: ? 1 cup of raw leafy vegetables. ?  cup of raw or cooked cut-up vegetables. ?  cup of vegetable juice.  Fill one fourth of your plate with whole grains. Look for the word "whole" as the first word in the ingredient list.  Fill one fourth of your plate with lean protein foods.  Eat 4-5 servings of fruit per day. A serving of fruit is: ? One medium whole fruit. ?  cup of dried fruit. ?  cup of fresh, frozen,  or canned fruit. ?  cup of 100% fruit juice.  Eat more foods that contain soluble fiber. These include apples, broccoli, carrots, beans, peas, and barley. Try to get 20-30 g of fiber per day.  Eat more home-cooked food. Eat less restaurant, buffet, and fast food.  Limit or avoid alcohol.  Limit foods high in starch and sugar.  Avoid fried foods.  Avoid frying your food. Try baking, boiling, grilling, or broiling it instead. You can also reduce fat by: ? Removing the skin from poultry. ? Removing all visible fats from meats. ? Skimming the fat off of stews, soups, and gravies before serving them. ? Steaming vegetables in water or broth.  Lose weight if you are overweight.  Eat 4-5 servings of nuts, legumes, and seeds per week: ? One serving of dried beans or legumes equals  cup after being cooked. ? One serving of nuts equals 1 ounces. ? One serving of seeds equals  ounce or one tablespoon.  You may need to keep track of how much salt or sodium you eat. This is especially true if you have high blood pressure. Talk with your doctor or dietitian to get more information. What foods can I eat? Grains Breads, including [place], white, pita, wheat, raisin, rye, oatmeal, and [place]. Tortillas that are neither fried nor made with lard or trans fat. Low-fat rolls, including hotdog and hamburger buns and English muffins. Biscuits. Muffins. Waffles. Pancakes. Light popcorn. Whole-grain cereals. Flatbread. Melba toast. Pretzels.  Breadsticks. Rusks. Low-fat snacks. Low-fat crackers, including oyster, saltine, matzo, graham, animal, and rye. Rice and pasta, including brown rice and pastas that are made with whole wheat. Vegetables All vegetables. Fruits All fruits, but limit coconut. Meats and Other Protein Sources Lean, well-trimmed beef, veal, pork, and lamb. Chicken and [place] without skin. All fish and shellfish. Wild duck, rabbit, pheasant, and venison. Egg whites or low-cholesterol egg  substitutes. Dried beans, peas, lentils, and tofu. Seeds and most nuts. Dairy Low-fat or nonfat cheeses, including ricotta, string, and mozzarella. Skim or 1% milk that is liquid, powdered, or evaporated. Buttermilk that is made with low-fat milk. Nonfat or low-fat yogurt. Beverages Mineral water. Diet carbonated beverages. Sweets and Desserts Sherbets and fruit ices. Honey, jam, marmalade, jelly, and syrups. Meringues and gelatins. Pure sugar candy, such as hard candy, jelly beans, gumdrops, mints, marshmallows, and small amounts of dark chocolate. [employer]. Eat all sweets and desserts in moderation. Fats and Oils Nonhydrogenated (trans-free) margarines. Vegetable oils, including soybean, sesame, sunflower, olive, peanut, safflower, corn, canola, and cottonseed. Salad dressings or mayonnaise made with a vegetable oil. Limit added fats and oils that you use for cooking, baking, salads, and as spreads. Other Cocoa powder. Coffee and tea. All seasonings and condiments. The items listed above may not be a complete list of recommended foods or beverages. Contact your dietitian for more options. What foods are not recommended? Grains Breads that are made with saturated or trans fats, oils, or whole milk. Croissants. Butter rolls. Cheese breads. Sweet rolls. Donuts. Buttered popcorn. Chow mein noodles. High-fat crackers, such as cheese or butter crackers. Meats and Other Protein Sources Fatty meats, such as hotdogs, short ribs, sausage, spareribs, bacon, rib eye roast or steak, and mutton. High-fat deli meats, such as salami and bologna. Caviar. Domestic duck and goose. Organ meats, such as kidney, liver, sweetbreads, and heart. Dairy Cream, sour cream, cream cheese, and creamed cottage cheese. Whole-milk cheeses, including blue (bleu), [address], [place], [place], [address], [place], [address], cheddar, [place], and [place]. Whole or 2% milk that is liquid, evaporated, or condensed. Whole  buttermilk. Cream sauce or high-fat cheese sauce. Yogurt that is made from whole milk. Beverages Regular sodas and juice drinks with added sugar. Sweets and Desserts Frosting. Pudding. Cookies. Cakes other than angel food cake. Candy that has milk chocolate or white chocolate, hydrogenated fat, butter, coconut, or unknown ingredients. Buttered syrups. Full-fat ice cream or ice cream drinks. Fats and Oils Gravy that has suet, meat fat, or shortening. Cocoa butter, hydrogenated oils, palm oil, coconut oil, palm kernel oil. These can often be found in baked products, candy, fried foods, nondairy creamers, and whipped toppings. Solid fats and shortenings, including bacon fat, salt pork, lard, and butter. Nondairy cream substitutes, such as coffee creamers and sour cream substitutes. Salad dressings that are made of unknown oils, cheese, or sour cream. The items listed above may not be a complete list of foods and beverages to avoid. Contact your dietitian for more information. This information is not intended to replace advice given to you by your health care provider. Make sure you discuss any questions you have with your health care provider. Document Released: 05/01/2012 Document Revised: 04/07/2016 Document Reviewed: 04/24/2014 Elsevier Interactive Patient Education  [employer].

## 2018-04-07 NOTE — Progress Notes (Signed)
Patient refused CPAP HS. Patient stated he will call if he wants to wear one tonight.

## 2018-04-07 NOTE — Progress Notes (Signed)
Progress Note  Patient Name: Bradley Powell Date of Encounter: 04/07/2018  Primary Cardiologist: Dr. Dorthea Cove  Subjective   No chest pain. Neck a little sore.  Inpatient Medications    Scheduled Meds: . allopurinol  50 mg Oral Daily  . apixaban  5 mg Oral BID  . carvedilol  18.75 mg Oral BID WC  . isosorbide-hydrALAZINE  0.5 tablet Oral TID  . losartan  50 mg Oral Daily  . sodium chloride flush  3 mL Intravenous Q12H  . spironolactone  25 mg Oral QHS  . torsemide  20 mg Oral QODAY   Continuous Infusions: . sodium chloride     PRN Meds: sodium chloride, acetaminophen, colchicine, ondansetron (ZOFRAN) IV, sodium chloride flush   Vital Signs    Vitals:   04/06/18 2219 04/07/18 0651 04/07/18 0706 04/07/18 0845  BP: 110/83  (!) 142/92 (!) 142/86  Pulse:   98 99  Resp: (!) 24  (!) 22   Temp:   98.9 F (37.2 C)   TempSrc:   Oral   SpO2:      Weight:  (!) 327 lb 3.2 oz (148.4 kg)    Height:        Intake/Output Summary (Last 24 hours) at 04/07/2018 0912 Last data filed at 04/07/2018 0200 Gross per 24 hour  Intake 960 ml  Output 200 ml  Net 760 ml   Filed Weights   04/06/18 0910 04/07/18 0651  Weight: (!) 325 lb (147.4 kg) (!) 327 lb 3.2 oz (148.4 kg)    Telemetry    NSR with first degree AV block and PVC's - Personally Reviewed  ECG    NSR with first degree AV block and PVC's - Personally Reviewed  Physical Exam   GEN: Morbidly obese, No acute distress.   Neck: No JVD, no hematoma Cardiac: RRR, no murmurs, rubs, or gallops.  Respiratory: Clear to auscultation bilaterally. GI: Soft, nontender, non-distended , groin with no hematoma. MS: No edema; No deformity. Neuro:  Nonfocal  Psych: Normal affect   Labs    Chemistry Recent Labs  Lab 04/04/18 1253  NA 138  K 4.4  CL 98  CO2 22  GLUCOSE 239*  BUN 14  CREATININE 1.26  CALCIUM 9.3  GFRNONAA 74  GFRAA 85     Hematology Recent Labs  Lab 04/04/18 1253  WBC 7.1  RBC 4.49  HGB 12.6*    HCT 39.9  MCV 89  MCH 28.1  MCHC 31.6  RDW 14.6  PLT 190    Cardiac EnzymesNo results for input(s): TROPONINI in the last 168 hours. No results for input(s): TROPIPOC in the last 168 hours.   BNPNo results for input(s): BNP, PROBNP in the last 168 hours.   DDimer No results for input(s): DDIMER in the last 168 hours.   Radiology    No results found.  Cardiac Studies   none  Patient Profile     37 y.o. male admitted for EPS/RFA of atrial flutter  Assessment & Plan    1. Atrial flutter - he is s/p ablation and maintaining NSR. I would like him to hold his eliquis today and start back tonight. He will be able to stop the eliquis in a month if he is maintaining NSR.  2. Obesity - he is strongly encouraged to start walking tomorrow. No heavy lifting for a week. 3. Chronic systolic heart failure - he will need to followup with Dr. Dorthea Cove. Continue outpatient CHF meds.  For questions or updates,  please contact [facility] Please consult [email or web address] for contact info under Cardiology/STEMI.      Signed, Lewayne Bunting, MD  04/07/2018, 9:12 AM  Patient ID: Bradley Powell, male   DOB: [birth year], 37 y.o.   MRN: [id number]

## 2018-04-10 ENCOUNTER — Encounter (HOSPITAL_COMMUNITY): Payer: Self-pay | Admitting: Internal Medicine

## 2018-04-11 ENCOUNTER — Other Ambulatory Visit: Payer: Self-pay

## 2018-04-11 ENCOUNTER — Ambulatory Visit (HOSPITAL_COMMUNITY)
Admission: RE | Admit: 2018-04-11 | Discharge: 2018-04-11 | Disposition: A | Discharge status: Home / Self Care | Payer: BLUE CROSS/BLUE SHIELD | Source: Ambulatory Visit | Attending: Internal Medicine | Admitting: Internal Medicine

## 2018-04-11 ENCOUNTER — Encounter (HOSPITAL_COMMUNITY): Payer: Self-pay | Admitting: Internal Medicine

## 2018-04-11 VITALS — BP 99/64 | HR 97 | Wt 322.0 lb

## 2018-04-11 DIAGNOSIS — G4730 Sleep apnea, unspecified: Secondary | ICD-10-CM | POA: Diagnosis not present

## 2018-04-11 DIAGNOSIS — I493 Ventricular premature depolarization: Secondary | ICD-10-CM | POA: Diagnosis not present

## 2018-04-11 DIAGNOSIS — I5042 Chronic combined systolic (congestive) and diastolic (congestive) heart failure: Secondary | ICD-10-CM | POA: Diagnosis not present

## 2018-04-11 DIAGNOSIS — I429 Cardiomyopathy, unspecified: Secondary | ICD-10-CM | POA: Diagnosis not present

## 2018-04-11 DIAGNOSIS — I272 Pulmonary hypertension, unspecified: Secondary | ICD-10-CM | POA: Insufficient documentation

## 2018-04-11 DIAGNOSIS — I110 Hypertensive heart disease with heart failure: Secondary | ICD-10-CM | POA: Diagnosis not present

## 2018-04-11 DIAGNOSIS — Z8249 Family history of ischemic heart disease and other diseases of the circulatory system: Secondary | ICD-10-CM | POA: Diagnosis not present

## 2018-04-11 DIAGNOSIS — Z6841 Body Mass Index (BMI) 40.0 and over, adult: Secondary | ICD-10-CM | POA: Diagnosis not present

## 2018-04-11 DIAGNOSIS — I4892 Unspecified atrial flutter: Secondary | ICD-10-CM | POA: Insufficient documentation

## 2018-04-11 DIAGNOSIS — Z79899 Other long term (current) drug therapy: Secondary | ICD-10-CM | POA: Insufficient documentation

## 2018-04-11 DIAGNOSIS — M109 Gout, unspecified: Secondary | ICD-10-CM | POA: Diagnosis not present

## 2018-04-11 DIAGNOSIS — Z7901 Long term (current) use of anticoagulants: Secondary | ICD-10-CM | POA: Insufficient documentation

## 2018-04-11 DIAGNOSIS — Z823 Family history of stroke: Secondary | ICD-10-CM | POA: Insufficient documentation

## 2018-04-11 NOTE — Addendum Note (Signed)
Encounter addended by: Noralee Space, RN on: 04/11/2018 3:40 PM  Actions taken: Sign clinical note

## 2018-04-11 NOTE — Progress Notes (Signed)
Medication Samples have been provided to the patient.  Drug name: Eliquis       Strength: 2.5mg         Qty: 3 LOT: [id number]  Exp.Date: 7/20  Dosing instructions: take 2 tabs Twice daily   The patient has been instructed regarding the correct time, dose, and frequency of taking this medication, including desired effects and most common side effects.   Romonia Yanik 3:39 PM 04/11/2018

## 2018-04-11 NOTE — Patient Instructions (Signed)
Your physician recommends that you schedule a follow-up appointment in: 2-3 months  

## 2018-04-11 NOTE — Progress Notes (Signed)
Advanced Heart Failure Clinic Note   PCP: Dr Nehemiah Settle  Primary HF Cardiologist: Dr [name]  HPI: Bradley Powell is a 37 y.o. male with a history of combined systolic/diastolic heart failure,  obesity, HTN, and gout.   Admitted 4/13 through 03/07/17 with marked volume overload. EF 40-45%. Diuresed with IV lasix tid + metolazone. Had RHC/LHC as noted below. Overall diuresed 45 pounds. On the day of discharge carvedilol was restarted. Discharge weight was 281 pounds.   Today he returns for HF follow up. Last visit, found to be in atrial flutter and started on eliquis. Referred to EP for ablation. Coreg was also increased. He underwent atrial flutter ablation on 04/06/18. He is SOB with walking long distances, especially up hills and steps. He is doing Crossfit to lose weight. He has a dry cough that started a few weeks. No fever or chills. Denies orthopnea, PND, or edema. Appetite and energy level good. No CP or dizziness. Weights at home: 315 lbs. Taking all medications. Takes extra torsemide a couple times/month which helps with cough.  Echo 9/18 EF ~25-30%.  Echo 3/19: EF 45-50%, reviewed by Dr [name]  Holter 7/18: 6% PVCs  Sleep study AHI 26/hr  RHC/LHC 03/01/2017  AO = 109/86 (97) LV =  101/28 RA =  29 RV = 50/22 PA = 55/24 (38) PCW = 26 Fick cardiac output/index = 6.4/2.6 Thermal CO/CI = 4.9/2.0 PVR = 2.5 WU FA sat = 98% PA sat = 69%, 71% SVC sat = 82%, 82% IVC sat = 66% Assessment: 1. Normal coronary arteries  2. Mild to moderate pulmonary HTN with normal PVR 3. Biventricular volume overload with R > L heart failure 4. Moderately decreased CO by thermodilution  5. 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 No intra-cardiac shunt  Review of systems complete and found to be negative unless listed in HPI.    SH:  Social History   Socioeconomic History  . Marital status: Single   Spouse name: Not on file  . Number of children: Not on file  . Years of education: Not on file  . Highest education level: Not on file  Occupational History  . Not on file  Social Needs  . Financial resource strain: Not on file  . Food insecurity:    Worry: Not on file    Inability: Not on file  . Transportation needs:    Medical: Not on file    Non-medical: Not on file  Tobacco Use  . Smoking status: Never Smoker  . Smokeless tobacco: Never Used  Substance and Sexual Activity  . Alcohol use: Yes    Comment: rarely  . Drug use: No  . Sexual activity: Not on file  Lifestyle  . Physical activity:    Days per week: Not on file    Minutes per session: Not on file  . Stress: Not on file  Relationships  . Social connections:    Talks on phone: Not on file    Gets together: Not on file    Attends religious service: Not on file    Active member of club or organization: Not on file    Attends meetings of clubs or organizations: Not on file    Relationship status: Not on file  . Intimate partner violence:    Fear of current or ex partner: Not on file    Emotionally abused: Not on file    Physically  abused: Not on file    Forced sexual activity: Not on file  Other Topics Concern  . Not on file  Social History Narrative  . Not on file    FH:  Family History  Problem Relation Age of Onset  . CVA Mother        pacemaker  . Multiple sclerosis Mother   . Seizures Mother   . Hypertension Father   . Gout Father     Past Medical History:  Diagnosis Date  . Atrial flutter (HCC)    a. s/p ablation 03/2018.  Marland Kitchen Chronic combined systolic and diastolic CHF (congestive heart failure) (HCC)   . History of esophagogastroduodenoscopy (EGD)   . Morbid obesity (HCC)   . NICM (nonischemic cardiomyopathy) (HCC)   . PVC's (premature ventricular contractions)   . Sleep apnea     Current Outpatient Medications  Medication Sig Dispense Refill  . acetaminophen (TYLENOL) 500 MG tablet  Take 1,000 mg by mouth every 6 (six) hours as needed for moderate pain or headache.     . allopurinol (ZYLOPRIM) 100 MG tablet TAKE TWO (2) TABLETS BY MOUTH DAILY 60 tablet 11  . apixaban (ELIQUIS) 5 MG TABS tablet Take 1 tablet (5 mg total) by mouth 2 (two) times daily. 60 tablet 6  . carvedilol (COREG) 12.5 MG tablet Take 1.5 tablets (18.75 mg total) by mouth 2 (two) times daily with a meal. 90 tablet 5  . colchicine (COLCRYS) 0.6 MG tablet Take 1 tablet (0.6 mg total) by mouth daily as needed (gout flare). 30 tablet 5  . isosorbide-hydrALAZINE (BIDIL) 20-37.5 MG tablet Take 0.5 tablets by mouth 3 (three) times daily. 45 tablet 5  . losartan (COZAAR) 50 MG tablet TAKE ONE (1) TABLET BY MOUTH EVERY NIGHT AT BEDTIME 30 tablet 4  . spironolactone (ALDACTONE) 25 MG tablet TAKE ONE TABLET BY MOUTH EVERY NIGHT AT BEDTIME 30 tablet 3  . torsemide (DEMADEX) 20 MG tablet Take 1 tablet (20 mg total) by mouth every other day. 30 tablet 3   No current facility-administered medications for this encounter.    Vitals:   04/11/18 1432  BP: 99/64  Pulse: 97  SpO2: 98%  Weight: (!) 322 lb (146.1 kg)   Wt Readings from Last 3 Encounters:  04/11/18 (!) 322 lb (146.1 kg)  04/07/18 (!) 327 lb 3.2 oz (148.4 kg)  02/28/18 (!) 335 lb (152 kg)   PHYSICAL EXAM: General: Obese. No resp difficulty. HEENT: Normal anicteric  Neck: Supple. JVP difficult to assess, appears 7-8.  Carotids 2+ bilat; no bruits. No thyromegaly or nodule noted. Cor: PMI nondisplaced. RRR, No M/G/R noted Lungs: CTAB, normal effort. No wheeze Abdomen: Soft, non-tender, non-distended, no HSM. No bruits or masses. +BS  Extremities: No cyanosis, clubbing, or rash. R and LLE 1+ edema.  Neuro: Alert & orientedx3, cranial nerves grossly intact. moves all 4 extremities w/o difficulty. Affect pleasant  EKG: Sinus tach 106 RAE + long 1st AVB Personally reviewed (Reviewed with Dr. Graciela Husbands)    ASSESSMENT & PLAN: 1. Combined Systolic/Diastolic  Heart Failure- ECHO 1/61/0960 EF 40-45% Grade II DD. Peak PA pressure 46 mmhg. Echo 9/18 EF ~20-25%. Echo 01/2018: EF 45-50% (reviewed by Dr [name]) NYHA II.  - Volume status stable mildly elevated on exam. Continue torsemide 20 mg every other day. Encouraged to take extra as needed  - Continue carvedilol 18.75 mg BID - Continue spiro 25 mg daily.  - Continue Bidil 0.5 tab TID - Continue losartan to 50 mg qhs.  Will not rechallenge with Entresto just yet given previous severe orthostasis and nausea - Too large for cMRI.  - May get PYP scan in future 2. Sleep Apnea - Continue CPAP. He is wearing every night.  3. Morbid Obesity: - Body mass index is 48.25 kg/m.   4. Gout - Continue allopurinol 5. PVCs  - ? If cardiomyopathy PVC-related. 24-hour monitor  7/18  PVCs 6% - NSR with occasional blocked PAC's and occasional PVCs on Holter 6. Atrial flutter s/p aflutter ablation 04/06/18 - He is asymptomatic. No hx of afib or atrial flutter - Continue eliquis 5 mg BID. Dr Lubertha Basque note says he can stop Eliquis if he remains in NSR x 1 month. Has follow up next month wit him.  - EKG today shows sinus tach.  Alford Highland, NP  2:53 PM  Patient seen and examined with the above-signed Advanced Practice Provider and/or Housestaff. I personally reviewed laboratory data, imaging studies and relevant notes. I independently examined the patient and formulated the important aspects of the plan. I have edited the note to reflect any of my changes or salient points. I have personally discussed the plan with the patient and/or family.  Overall doing well. He is s/p AFL ablation. ECG today reviewed with Dr. Graciela Husbands. Looks like sinus tach with very long PR. Continue Eliquis for now. High-risk for recurrent atrial arrhythmias will continue for now. Volume status mildly elevated. Encouraged him to take extra lasix as needed. Remains very active. Congratulated him on weight loss. BP too low to titrate HF meds.    Arvilla Meres, MD  3:28 PM

## 2018-04-19 ENCOUNTER — Telehealth (HOSPITAL_COMMUNITY): Payer: Self-pay

## 2018-04-19 ENCOUNTER — Other Ambulatory Visit (HOSPITAL_COMMUNITY): Payer: Self-pay

## 2018-04-19 NOTE — Telephone Encounter (Signed)
[facility] ready for pick -up called and left message. Left at front desk.

## 2018-04-19 NOTE — Progress Notes (Signed)
Entered in error

## 2018-05-04 ENCOUNTER — Ambulatory Visit: Payer: BLUE CROSS/BLUE SHIELD | Admitting: Internal Medicine

## 2018-05-04 ENCOUNTER — Encounter: Payer: Self-pay | Admitting: Internal Medicine

## 2018-05-04 VITALS — BP 94/62 | HR 87 | Ht 68.0 in | Wt 312.0 lb

## 2018-05-04 DIAGNOSIS — I5022 Chronic systolic (congestive) heart failure: Secondary | ICD-10-CM

## 2018-05-04 DIAGNOSIS — I4892 Unspecified atrial flutter: Secondary | ICD-10-CM

## 2018-05-04 NOTE — Progress Notes (Signed)
HPI [name] returns today for ongoing evaluation and management of his atrial flutter, s/p EPS/RFA of atrial flutter. He is walking up to 2 miles.  He denies medical non-compliance. He denies chest pain. He has class 2. Dyspnea. No edema.  Allergies  Allergen Reactions  . Entresto [Sacubitril-Valsartan] Nausea And Vomiting and Other (See Comments)    Lightheaded, fainting     Current Outpatient Medications  Medication Sig Dispense Refill  . acetaminophen (TYLENOL) 500 MG tablet Take 1,000 mg by mouth every 6 (six) hours as needed for moderate pain or headache.     . allopurinol (ZYLOPRIM) 100 MG tablet TAKE TWO (2) TABLETS BY MOUTH DAILY 60 tablet 11  . apixaban (ELIQUIS) 5 MG TABS tablet Take 1 tablet (5 mg total) by mouth 2 (two) times daily. 60 tablet 6  . carvedilol (COREG) 12.5 MG tablet Take 1.5 tablets (18.75 mg total) by mouth 2 (two) times daily with a meal. 90 tablet 5  . colchicine (COLCRYS) 0.6 MG tablet Take 1 tablet (0.6 mg total) by mouth daily as needed (gout flare). 30 tablet 5  . isosorbide-hydrALAZINE (BIDIL) 20-37.5 MG tablet Take 0.5 tablets by mouth 3 (three) times daily. 45 tablet 5  . losartan (COZAAR) 50 MG tablet TAKE ONE (1) TABLET BY MOUTH EVERY NIGHT AT BEDTIME 30 tablet 4  . spironolactone (ALDACTONE) 25 MG tablet TAKE ONE TABLET BY MOUTH EVERY NIGHT AT BEDTIME 30 tablet 3  . torsemide (DEMADEX) 20 MG tablet Take 1 tablet (20 mg total) by mouth every other day. 30 tablet 3   No current facility-administered medications for this visit.      Past Medical History:  Diagnosis Date  . Atrial flutter (HCC)    a. s/p ablation 03/2018.  Marland Kitchen Chronic combined systolic and diastolic CHF (congestive heart failure) (HCC)   . History of esophagogastroduodenoscopy (EGD)   . Morbid obesity (HCC)   . NICM (nonischemic cardiomyopathy) (HCC)   . PVC's (premature ventricular contractions)   . Sleep apnea     ROS:   All systems reviewed and negative except  as noted in the HPI.   Past Surgical History:  Procedure Laterality Date  . A-FLUTTER ABLATION N/A 04/06/2018   Procedure: A-FLUTTER ABLATION;  Surgeon: Marinus Maw, MD;  Location: [facility] INVASIVE CV LAB;  Service: Cardiovascular;  Laterality: N/A;  . NO PAST SURGERIES    . RIGHT/LEFT HEART CATH AND CORONARY ANGIOGRAPHY N/A 03/01/2017   Procedure: Right/Left Heart Cath and Coronary Angiography;  Surgeon: Dolores Patty, MD;  Location: [facility] INVASIVE CV LAB;  Service: Cardiovascular;  Laterality: N/A;     Family History  Problem Relation Age of Onset  . CVA Mother        pacemaker  . Multiple sclerosis Mother   . Seizures Mother   . Hypertension Father   . Gout Father      Social History   Socioeconomic History  . Marital status: Single    Spouse name: Not on file  . Number of children: Not on file  . Years of education: Not on file  . Highest education level: Not on file  Occupational History  . Not on file  Social Needs  . Financial resource strain: Not on file  . Food insecurity:    Worry: Not on file    Inability: Not on file  . Transportation needs:    Medical: Not on file    Non-medical: Not on file  Tobacco Use  .  Smoking status: Never Smoker  . Smokeless tobacco: Never Used  Substance and Sexual Activity  . Alcohol use: Yes    Comment: rarely  . Drug use: No  . Sexual activity: Not on file  Lifestyle  . Physical activity:    Days per week: Not on file    Minutes per session: Not on file  . Stress: Not on file  Relationships  . Social connections:    Talks on phone: Not on file    Gets together: Not on file    Attends religious service: Not on file    Active member of club or organization: Not on file    Attends meetings of clubs or organizations: Not on file    Relationship status: Not on file  . Intimate partner violence:    Fear of current or ex partner: Not on file    Emotionally abused: Not on file    Physically abused: Not on file     Forced sexual activity: Not on file  Other Topics Concern  . Not on file  Social History Narrative  . Not on file     BP 94/62   Pulse 87   Ht 5\' 8"  (1.727 m)   Wt (!) 312 lb (141.5 kg)   SpO2 97%   BMI 47.44 kg/m   Physical Exam:  obese appearing NAD HEENT: Unremarkable Neck:  No JVD, no thyromegally Lymphatics:  No adenopathy Back:  No CVA tenderness Lungs:  Clear with no wheezes HEART:  Regular rate rhythm, no murmurs, no rubs, no clicks Abd:  soft, positive bowel sounds, no organomegally, no rebound, no guarding Ext:  2 plus pulses, no edema, no cyanosis, no clubbing Skin:  No rashes no nodules Neuro:  CN II through XII intact, motor grossly intact  EKG - NSR with first degree AV block and PVC's.   Assess/Plan: 1. Atrial flutter - he is maintaining NSR. While his obesity and LV dysfunction increase his risk of atrial fib, he is a candidate to stop the eliquis any time after 6/24. He will follow up in the CHF clinic next week and from my perspective he can stop his Eliquis at that time if he remains in NSR.  2. Chronic systolic and diastolic heart failure - I have asked him to increase his physical activity. He is encouraged to continue his current meds and to walk. 3. Obesity - I have strongly encouraged the patient to walk.  4. PVC - a 6 % burden of PVC's almost never causes a PVC induced CM. At this time I would undergo watchful waiting.  Leonia Reeves.D.

## 2018-05-04 NOTE — Patient Instructions (Addendum)
Medication Instructions:  Your physician recommends that you continue on your current medications as directed. Please refer to the Current Medication list given to you today.  Labwork: None ordered.  Testing/Procedures: None ordered.  Follow-Up: Your physician wants you to follow-up in: as needed with Dr. Taylor.      Any Other Special Instructions Will Be Listed Below (If Applicable).  If you need a refill on your cardiac medications before your next appointment, please call your pharmacy.   

## 2018-05-31 ENCOUNTER — Other Ambulatory Visit (HOSPITAL_COMMUNITY): Payer: Self-pay | Admitting: Internal Medicine

## 2018-06-07 ENCOUNTER — Other Ambulatory Visit: Payer: Self-pay

## 2018-06-07 ENCOUNTER — Ambulatory Visit (HOSPITAL_COMMUNITY)
Admission: RE | Admit: 2018-06-07 | Discharge: 2018-06-07 | Disposition: A | Discharge status: Home / Self Care | Payer: BLUE CROSS/BLUE SHIELD | Source: Ambulatory Visit | Attending: Internal Medicine | Admitting: Internal Medicine

## 2018-06-07 ENCOUNTER — Encounter (HOSPITAL_COMMUNITY): Payer: Self-pay | Admitting: Internal Medicine

## 2018-06-07 ENCOUNTER — Encounter (HOSPITAL_COMMUNITY): Payer: Self-pay | Admitting: *Deleted

## 2018-06-07 VITALS — BP 124/75 | HR 87 | Wt 328.0 lb

## 2018-06-07 DIAGNOSIS — M109 Gout, unspecified: Secondary | ICD-10-CM | POA: Diagnosis not present

## 2018-06-07 DIAGNOSIS — Z6841 Body Mass Index (BMI) 40.0 and over, adult: Secondary | ICD-10-CM | POA: Insufficient documentation

## 2018-06-07 DIAGNOSIS — I110 Hypertensive heart disease with heart failure: Secondary | ICD-10-CM | POA: Insufficient documentation

## 2018-06-07 DIAGNOSIS — I5022 Chronic systolic (congestive) heart failure: Secondary | ICD-10-CM

## 2018-06-07 DIAGNOSIS — Z79899 Other long term (current) drug therapy: Secondary | ICD-10-CM | POA: Insufficient documentation

## 2018-06-07 DIAGNOSIS — I428 Other cardiomyopathies: Secondary | ICD-10-CM

## 2018-06-07 DIAGNOSIS — G4730 Sleep apnea, unspecified: Secondary | ICD-10-CM | POA: Diagnosis not present

## 2018-06-07 DIAGNOSIS — I493 Ventricular premature depolarization: Secondary | ICD-10-CM | POA: Diagnosis not present

## 2018-06-07 DIAGNOSIS — I5042 Chronic combined systolic (congestive) and diastolic (congestive) heart failure: Secondary | ICD-10-CM | POA: Diagnosis not present

## 2018-06-07 DIAGNOSIS — I4892 Unspecified atrial flutter: Secondary | ICD-10-CM | POA: Insufficient documentation

## 2018-06-07 LAB — BASIC METABOLIC PANEL
ANION GAP: 10 (ref 5–15)
BUN: 12 mg/dL (ref 6–20)
CALCIUM: 9.3 mg/dL (ref 8.9–10.3)
CO2: 23 mmol/L (ref 22–32)
CREATININE: 1.06 mg/dL (ref 0.61–1.24)
Chloride: 104 mmol/L (ref 98–111)
Glucose, Bld: 148 mg/dL — ABNORMAL HIGH (ref 70–99)
Potassium: 4 mmol/L (ref 3.5–5.1)
SODIUM: 137 mmol/L (ref 135–145)

## 2018-06-07 NOTE — Progress Notes (Signed)
Advanced Heart Failure Clinic Note   PCP: Dr Nehemiah Settle  Primary HF Cardiologist: Dr [name]  HPI: Bradley Powell is a 37 y.o. male with a history of combined systolic/diastolic heart failure,  obesity, HTN, and gout.   Admitted 4/13 through 03/07/17 with marked volume overload. EF 40-45%. Diuresed with IV lasix tid + metolazone. Had RHC/LHC as noted below. Overall diuresed 45 pounds. On the day of discharge carvedilol was restarted. Discharge weight was 281 pounds.   He underwent atrial flutter ablation on 04/06/18.  Returns today for routine f/u. Says he is up and down. Can do most of the stuff he wants to do. Remains active. Was in [place] this weekend and walked all over about 10 miles.  If he skips a fluid pill he will gain fluid quickly. No orthopnea, PND or edema. No dizziness.No palpitations.  Mother recently referred to [facility] for heart transplant eval.  Echo 9/18 EF ~25-30%.  Echo 3/19: EF 45-50%, reviewed by Dr [name]  Holter 7/18: 6% PVCs  Sleep study AHI 26/hr  RHC/LHC 03/01/2017  AO = 109/86 (97) LV =  101/28 RA =  29 RV = 50/22 PA = 55/24 (38) PCW = 26 Fick cardiac output/index = 6.4/2.6 Thermal CO/CI = 4.9/2.0 PVR = 2.5 WU FA sat = 98% PA sat = 69%, 71% SVC sat = 82%, 82% IVC sat = 66% Assessment: 1. Normal coronary arteries  2. Mild to moderate pulmonary HTN with normal PVR 3. Biventricular volume overload with R > L heart failure 4. Moderately decreased CO by thermodilution  5. 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 No intra-cardiac shunt  Review of systems complete and found to be negative unless listed in HPI.    SH:  Social History   Socioeconomic History  . Marital status: Single    Spouse name: Not on file  . Number of children: Not on file  . Years of education: Not on file  . Highest education level: Not on file  Occupational History  . Not on file    Social Needs  . Financial resource strain: Not on file  . Food insecurity:    Worry: Not on file    Inability: Not on file  . Transportation needs:    Medical: Not on file    Non-medical: Not on file  Tobacco Use  . Smoking status: Never Smoker  . Smokeless tobacco: Never Used  Substance and Sexual Activity  . Alcohol use: Yes    Comment: rarely  . Drug use: No  . Sexual activity: Not on file  Lifestyle  . Physical activity:    Days per week: Not on file    Minutes per session: Not on file  . Stress: Not on file  Relationships  . Social connections:    Talks on phone: Not on file    Gets together: Not on file    Attends religious service: Not on file    Active member of club or organization: Not on file    Attends meetings of clubs or organizations: Not on file    Relationship status: Not on file  . Intimate partner violence:    Fear of current or ex partner: Not on file    Emotionally abused: Not on file    Physically abused: Not on file    Forced sexual activity: Not on file  Other Topics Concern  . Not on file  Social  History Narrative  . Not on file    FH:  Family History  Problem Relation Age of Onset  . CVA Mother        pacemaker  . Multiple sclerosis Mother   . Seizures Mother   . Hypertension Father   . Gout Father     Past Medical History:  Diagnosis Date  . Atrial flutter (HCC)    a. s/p ablation 03/2018.  Marland Kitchen Chronic combined systolic and diastolic CHF (congestive heart failure) (HCC)   . History of esophagogastroduodenoscopy (EGD)   . Morbid obesity (HCC)   . NICM (nonischemic cardiomyopathy) (HCC)   . PVC's (premature ventricular contractions)   . Sleep apnea     Current Outpatient Medications  Medication Sig Dispense Refill  . acetaminophen (TYLENOL) 500 MG tablet Take 1,000 mg by mouth every 6 (six) hours as needed for moderate pain or headache.     . allopurinol (ZYLOPRIM) 100 MG tablet TAKE TWO (2) TABLETS BY MOUTH DAILY 60 tablet 11   . apixaban (ELIQUIS) 5 MG TABS tablet Take 1 tablet (5 mg total) by mouth 2 (two) times daily. 60 tablet 6  . carvedilol (COREG) 12.5 MG tablet Take 1.5 tablets (18.75 mg total) by mouth 2 (two) times daily with a meal. 90 tablet 5  . colchicine (COLCRYS) 0.6 MG tablet Take 1 tablet (0.6 mg total) by mouth daily as needed (gout flare). 30 tablet 5  . isosorbide-hydrALAZINE (BIDIL) 20-37.5 MG tablet Take 0.5 tablets by mouth 3 (three) times daily. 45 tablet 5  . losartan (COZAAR) 50 MG tablet TAKE ONE (1) TABLET BY MOUTH EVERY NIGHT AT BEDTIME 30 tablet 4  . spironolactone (ALDACTONE) 25 MG tablet TAKE ONE TABLET BY MOUTH EVERY NIGHT AT BEDTIME 30 tablet 3  . torsemide (DEMADEX) 20 MG tablet Take 1 tablet (20 mg total) by mouth every other day. 30 tablet 3   No current facility-administered medications for this encounter.    Vitals:   06/07/18 0959  BP: 124/75  Pulse: 87  SpO2: 97%  Weight: (!) 328 lb (148.8 kg)   Wt Readings from Last 3 Encounters:  06/07/18 (!) 328 lb (148.8 kg)  05/04/18 (!) 312 lb (141.5 kg)  04/11/18 (!) 322 lb (146.1 kg)   PHYSICAL EXAM: General:  Well appearing. No resp difficulty HEENT: normal Neck: supple. no JVD. Carotids 2+ bilat; no bruits. No lymphadenopathy or thryomegaly appreciated. Cor: PMI nondisplaced. Regular rate & rhythm. No rubs, gallops or murmurs. Lungs: clear Abdomen: obese soft, nontender, nondistended. No hepatosplenomegaly. No bruits or masses. Good bowel sounds. Extremities: no cyanosis, clubbing, rash, edema Neuro: alert & orientedx3, cranial nerves grossly intact. moves all 4 extremities w/o difficulty. Affect pleasant   ECG: NSR 87 with marked 1AVB ( ) No ST-T wave abnormalities. Personally reviewed  ASSESSMENT & PLAN: 1. Combined Systolic/Diastolic Heart Failure- ECHO 02/22/2017 EF 40-45% Grade II DD. Peak PA pressure 46 mmhg. Echo 9/18 EF ~20-25%. Echo 01/2018: EF 40-45%  - Stable NYHA I-II.  - Volume status looks good.  Continue torsemide 20 mg every other day. Encouraged to take extra as needed  - Continue carvedilol 18.75 mg BID - Continue spiro 25 mg daily.  - Increase Bidil 1 tab TID - Continue losartan 50 mg qhs. Consider increasing next visit  Will not rechallenge with Entresto just yet given previous severe orthostasis and nausea - Too large for cMRI.  - Given apparent familial CM will refer to Dr. Jomarie Longs for Genetics Counseling - EF > 35% on  last echo so no ICD at this time. Repeat echo in several months.  2. Sleep Apnea - Continue CPAP. He is wearing every night.  3. Morbid Obesity: - Body mass index is 49.87 kg/m.   4. Gout - Continue allopurinol 5. PVCs  - ? If cardiomyopathy in part PVC-related. 24-hour monitor  7/18  PVCs 6% -  NSR with occasional blocked PAC's and occasional PVCs on Holter 6. Atrial flutter s/p aflutter ablation 04/06/18 - ECG with NSR and marked 1AVB. Stop Eliquis  - Marked 1AVB persists. Will f/u with Dr. Ladona Ridgel. Tolerting b-blocker.  Arvilla Meres, MD  10:16 AM

## 2018-06-07 NOTE — Patient Instructions (Signed)
Stop Eliquis  Labs today  You have been referred to Genetic Counseling at [facility], they will call you for an appointment  Your physician recommends that you schedule a follow-up appointment in: 2 months

## 2018-06-09 ENCOUNTER — Other Ambulatory Visit (HOSPITAL_COMMUNITY): Payer: Self-pay | Admitting: Internal Medicine

## 2018-07-04 ENCOUNTER — Telehealth (HOSPITAL_COMMUNITY): Payer: Self-pay | Admitting: *Deleted

## 2018-07-04 NOTE — Telephone Encounter (Signed)
Called pt for him to pick up his Bidil. Bidil has been as the front desk since 06/15/18.

## 2018-07-20 ENCOUNTER — Telehealth (HOSPITAL_COMMUNITY): Payer: Self-pay | Admitting: *Deleted

## 2018-07-20 NOTE — Telephone Encounter (Signed)
Called and spoke with patient, he's aware to pickup his Bidil at the front desk.

## 2018-07-26 ENCOUNTER — Ambulatory Visit: Payer: BLUE CROSS/BLUE SHIELD | Admitting: Genetic Counselor

## 2018-08-02 ENCOUNTER — Other Ambulatory Visit (HOSPITAL_COMMUNITY): Payer: Self-pay | Admitting: Internal Medicine

## 2018-08-03 NOTE — Progress Notes (Signed)
Pre-test GC notes  Bradley Powell was referred for genetic consult of non-ischemic cardiomyopathy. We reviewed the different genetic cardiomyopathies that are considered as non-ischemic cardiomyopathy, namely ARVC (now called AC), DCM and HCM. We also reviewed cardiac and systemic conditions that can cause DCM, namely myocarditis, ischemic heart disease, infiltrative myocardial disease (amyloidosis, sarcoidosis, hemochromatosis), paripartum cardiomyopathy, hypertension, infection with HIV virus, connective tissue disease (such as systemic lupus erythematosus etc.), substance abuse (chronic alcohol abuse, cocaine abuse), doxorubicin therapy and other cardiovascular diseases (valvular heart disease, HCM). He was counseled on these cardiomyopathies, namely inheritance, incomplete penetrance, variable expression and digenic/compound mutations that can be seen in some patients. His medical and 4-generation family history was obtained. See details below  Personal Medical Information Bradley Powell (III.2 on pedigree) is a pleasant 37 year old [place] American man who is here today with his mother, Bradley Powell. 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   He states that his symptoms of shortness of breath upon exertion first started around Dec 2017 at age 46. This worsened progressively with his feet swelling in Feb /Mar of 2017 during parade practice and by April there was fluid accumulation around his belly. He informs me that one April morning he experienced swelling in his legs and groin.  He went to the ER where he was given fluid pills and was told that he seemed to have heart failure.  He was referred to Bradley Powell who immediately admitted him to the Powell. He states that he was given Lasix and his swelling subsided 11 days later. He was then referred to Bradley Powell for NICM and heart  failure.   He mentions an episode of chest pains when he was at high school. He reports being hit in the chest with a soccer ball and felt as if his heart was hurting. He sought medical care and was told that he had acid reflux.    Family history Bradley Powell (III.2) is the second of three siblings. He does not have kids. His older brother (III.1) and younger sister (III.3) are in good health.  His mother (II.2) was found to have multiple sclerosis at 32. She states that her vision began to blur at work and upon evaluation was found to have MS. She had seizures in her 42s and later developed heart block in her 62s. She reports fainting at work and was subsequently evaluated by a neurologist. Upon screening, she was found to have heart block and a pacemaker was implanted soon after.   She reports a syncopal event a year later when she woke up at night to got the bathroom and fainted. She was taken to the Powell and was told that she had a stroke.   She reports having four upgrades to her pacemaker. She states that she got progressively weaker, could not walk and began having fluid retention at age 40. She tells me that she stayed active until two years ago. In 2017, she had tachycardia while watching a [employer] game and was later diagnosed with Afib. She has now slowed down her activity level due to increasing fatigue. She is now seeing Bradley Powell and states that she will need a heart and kidney transplant.  Besides Bradley Powell's mother there is significant family history of heart disease in his maternal lineage. While his maternal grandfather (1.4) died of cancer at age 22, his mom reports three paternal aunts dying of  heart disease (I.5-I.7). In addition, she informs me that three of her paternal cousins (II.4, II.6, II.8) had a heart transplant at relatively young ages of 53 (II.4), 88 (II.6) and 24 (II.8), the latter died from rejection of the transplanted heart. She had another cousin with heart  disease that was was awaiting a heart transplant and died at age 18 (II.7). Her second cousin (III.4) was also diagnosed  with CHF at 73 and has symptoms similar to that of Bradley Powell.   Impression  In summary, there is a significant personal and family history of heart disease, in his paternal lineage. Bradley Powell mom now has CHF. Additionally, there are two fourth-degree paternal relatives that had a heart transplant and two other fourth-degree paternal relatives that died at a very young age from either not having a heart transplant or rejection of transplanted heart.  Bradley Powell exhibits the characteristic features of a genetic disease. His early age of presentation, absence of risk factors, severity of symptoms and significant family history of heart disease that required a heart transplant in several paternal relatives is indicative of a genetic condition. It is very likely that he may have inherited a pathogenic variant from his mother.  In light of his presentation and family history of severe heart disease, genetic testing is highly recommended. The test should encompass the sarcomeric genes implicated in HCM, DCM and desmosomal genes implicated in ARVC Bradley Powell). This test will help to confirm his diagnosis and to direct appropriate treatment and management strategies.   Plans Bradley Powell is interested in genetic testing. Blood was not drawn today as the lab was closed by the time we finished. We will schedule Bradley Powell to come back for his blood draw.                                                                                                                                                                                                                                                                 Sidney Ace, Ph.D, [facility] Clinical Molecular Geneticist

## 2018-08-13 ENCOUNTER — Ambulatory Visit (HOSPITAL_COMMUNITY)
Admission: RE | Admit: 2018-08-13 | Discharge: 2018-08-13 | Disposition: A | Discharge status: Home / Self Care | Payer: BLUE CROSS/BLUE SHIELD | Source: Ambulatory Visit | Attending: Internal Medicine | Admitting: Internal Medicine

## 2018-08-13 DIAGNOSIS — I4892 Unspecified atrial flutter: Secondary | ICD-10-CM | POA: Insufficient documentation

## 2018-08-13 DIAGNOSIS — M109 Gout, unspecified: Secondary | ICD-10-CM | POA: Diagnosis not present

## 2018-08-13 DIAGNOSIS — I272 Pulmonary hypertension, unspecified: Secondary | ICD-10-CM | POA: Diagnosis not present

## 2018-08-13 DIAGNOSIS — Z79899 Other long term (current) drug therapy: Secondary | ICD-10-CM | POA: Diagnosis not present

## 2018-08-13 DIAGNOSIS — G4730 Sleep apnea, unspecified: Secondary | ICD-10-CM | POA: Diagnosis not present

## 2018-08-13 DIAGNOSIS — Z6841 Body Mass Index (BMI) 40.0 and over, adult: Secondary | ICD-10-CM | POA: Insufficient documentation

## 2018-08-13 DIAGNOSIS — I5022 Chronic systolic (congestive) heart failure: Secondary | ICD-10-CM

## 2018-08-13 DIAGNOSIS — I5042 Chronic combined systolic (congestive) and diastolic (congestive) heart failure: Secondary | ICD-10-CM | POA: Diagnosis not present

## 2018-08-13 DIAGNOSIS — I480 Paroxysmal atrial fibrillation: Secondary | ICD-10-CM

## 2018-08-13 DIAGNOSIS — I110 Hypertensive heart disease with heart failure: Secondary | ICD-10-CM | POA: Insufficient documentation

## 2018-08-13 MED ORDER — TORSEMIDE 20 MG PO TABS: 20.0000 mg | ORAL_TABLET | ORAL | 3 refills | Status: DC

## 2018-08-13 MED ORDER — ISOSORB DINITRATE-HYDRALAZINE 20-37.5 MG PO TABS: 1.0000 | ORAL_TABLET | Freq: Three times a day (TID) | ORAL | 5 refills | Status: DC

## 2018-08-13 NOTE — Patient Instructions (Signed)
Increase Bidil to 1 tab Three times a day   Your physician recommends that you schedule a follow-up appointment in: 2 months with echocardiogram

## 2018-08-13 NOTE — Progress Notes (Signed)
Advanced Heart Failure Clinic Note   PCP: Dr Nehemiah Settle  Primary HF Cardiologist: Dr [name]  HPI: Bradley Powell is a 37 y.o. male with a history of combined systolic/diastolic heart failure,  obesity, HTN, and gout.   Admitted 4/13 through 03/07/17 with marked volume overload. EF 40-45%. Diuresed with IV lasix tid + metolazone. Had RHC/LHC as noted below. Overall diuresed 45 pounds. On the day of discharge carvedilol was restarted. Discharge weight was 281 pounds.   He underwent atrial flutter ablation on 04/06/18.  Returns today for routine f/u. At last visit Bidil increased to 1 tab tid, but it did not translate to his paperwork and he's been on 1/2 tab TID. Overall doing okay. SOB with activities at the gym, like jumping rope for 10 minutes. No SOB with walking as long as he takes the torsemide. No problem with stairs or hills. Has occasional abdominal bloating and BLE edema after sitting for a long time. Denies orthopnea or PND. Wears CPAP qHS. No CP. Rare dizziness with bending and standing quickly or with lifting something heavy. Rarely misses medications. Taking torsemide every other day, but takes extra usually on the weekends. He ran out of torsemide last week because he couldn't get refills. Weights ~315-320 lbs, but up to 328 lbs today. Limits fluid and salt intake. Eats most meals at home. Now working FT in Pharmacist, community for [employer].   Echo 9/18 EF ~25-30%.  Echo 3/19: EF 45-50%, reviewed by Dr [name]  Holter 7/18: 6% PVCs  Sleep study AHI 26/hr  RHC/LHC 03/01/2017  AO = 109/86 (97) LV =  101/28 RA =  29 RV = 50/22 PA = 55/24 (38) PCW = 26 Fick cardiac output/index = 6.4/2.6 Thermal CO/CI = 4.9/2.0 PVR = 2.5 WU FA sat = 98% PA sat = 69%, 71% SVC sat = 82%, 82% IVC sat = 66% Assessment: 1. Normal coronary arteries  2. Mild to moderate pulmonary HTN with normal PVR 3. Biventricular volume overload with R > L heart failure 4. Moderately decreased  CO by thermodilution  5. 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 No intra-cardiac shunt  Review of systems complete and found to be negative unless listed in HPI.    SH:  Social History   Socioeconomic History  . Marital status: Single    Spouse name: Not on file  . Number of children: Not on file  . Years of education: Not on file  . Highest education level: Not on file  Occupational History  . Not on file  Social Needs  . Financial resource strain: Not on file  . Food insecurity:    Worry: Not on file    Inability: Not on file  . Transportation needs:    Medical: Not on file    Non-medical: Not on file  Tobacco Use  . Smoking status: Never Smoker  . Smokeless tobacco: Never Used  Substance and Sexual Activity  . Alcohol use: Yes    Comment: rarely  . Drug use: No  . Sexual activity: Not on file  Lifestyle  . Physical activity:    Days per week: Not on file    Minutes per session: Not on file  . Stress: Not on file  Relationships  . Social connections:    Talks on phone: Not on file    Gets together: Not on file    Attends religious service: Not on file  Active member of club or organization: Not on file    Attends meetings of clubs or organizations: Not on file    Relationship status: Not on file  . Intimate partner violence:    Fear of current or ex partner: Not on file    Emotionally abused: Not on file    Physically abused: Not on file    Forced sexual activity: Not on file  Other Topics Concern  . Not on file  Social History Narrative  . Not on file    FH:  Family History  Problem Relation Age of Onset  . CVA Mother        pacemaker  . Multiple sclerosis Mother   . Seizures Mother   . Hypertension Father   . Gout Father     Past Medical History:  Diagnosis Date  . Atrial flutter (HCC)    a. s/p ablation 03/2018.  Marland Kitchen Chronic combined systolic and diastolic CHF  (congestive heart failure) (HCC)   . History of esophagogastroduodenoscopy (EGD)   . Morbid obesity (HCC)   . NICM (nonischemic cardiomyopathy) (HCC)   . PVC's (premature ventricular contractions)   . Sleep apnea     Current Outpatient Medications  Medication Sig Dispense Refill  . acetaminophen (TYLENOL) 500 MG tablet Take 1,000 mg by mouth every 6 (six) hours as needed for moderate pain or headache.     . allopurinol (ZYLOPRIM) 100 MG tablet TAKE TWO (2) TABLETS BY MOUTH DAILY 60 tablet 11  . carvedilol (COREG) 12.5 MG tablet Take 1.5 tablets (18.75 mg total) by mouth 2 (two) times daily with a meal. 90 tablet 5  . colchicine (COLCRYS) 0.6 MG tablet Take 1 tablet (0.6 mg total) by mouth daily as needed (gout flare). 30 tablet 5  . isosorbide-hydrALAZINE (BIDIL) 20-37.5 MG tablet Take 0.5 tablets by mouth 3 (three) times daily. 45 tablet 5  . losartan (COZAAR) 50 MG tablet TAKE ONE (1) TABLET BY MOUTH EVERY NIGHT AT BEDTIME 30 tablet 4  . spironolactone (ALDACTONE) 25 MG tablet TAKE ONE TABLET BY MOUTH EVERY NIGHT AT BEDTIME 90 tablet 1  . torsemide (DEMADEX) 20 MG tablet Take 1 tablet (20 mg total) by mouth every other day. 30 tablet 3   No current facility-administered medications for this encounter.    Vitals:   08/13/18 1540  BP: (!) 154/76  Pulse: 88  SpO2: 98%  Weight: (!) 154.4 kg (340 lb 6.4 oz)   Wt Readings from Last 3 Encounters:  08/13/18 (!) 154.4 kg (340 lb 6.4 oz)  06/07/18 (!) 148.8 kg (328 lb)  05/04/18 (!) 141.5 kg (312 lb)   PHYSICAL EXAM: General:  No resp difficulty HEENT: normal Neck: supple. JVP flat. Carotids 2+ bilat; no bruits. No lymphadenopathy or thryomegaly appreciated. Cor: PMI laterally displaced. Regular rate & rhythm. No rubs, gallops or murmurs. Lungs: clear Abdomen: soft, nontender, nondistended. No hepatosplenomegaly. No bruits or masses. Good bowel sounds. Extremities: no cyanosis, clubbing, rash, edema Neuro: alert & orientedx3, cranial  nerves grossly intact. moves all 4 extremities w/o difficulty. Affect pleasant   ASSESSMENT & PLAN: 1. Combined Systolic/Diastolic Heart Failure- ECHO 02/22/2017 EF 40-45% Grade II DD. Peak PA pressure 46 mmhg. Echo 9/18 EF ~20-25%. Echo 01/2018: EF 40-45%  - Stable NYHA I-II.  - Volume status looks good. - Continue torsemide 20 mg every other day. Encouraged to take extra as needed  - Continue carvedilol 18.75 mg BID - Continue spiro 25 mg daily.  - Increase Bidil 1 tab  TID - Continue losartan 50 mg qhs. Consider increasing next visit  Will not rechallenge with Entresto just yet given previous severe orthostasis and nausea - Too large for cMRI.  - Has seen Dr. Jomarie Longs for Genetics Counseling - results pending  - EF > 35% on last echo so no ICD at this time. Repeat echo in several months.  2. Sleep Apnea - Continue CPAP. He is wearing every night.  3. Morbid Obesity: - Body mass index is 51.76 kg/m.   4. Gout - Continue allopurinol 5. PVCs  - ? If cardiomyopathy in part PVC-related. 24-hour monitor  7/18  PVCs 6% -  NSR with occasional blocked PAC's and occasional PVCs on Holter 6. Atrial flutter s/p aflutter ablation 04/06/18 - ECG with NSR and marked 1AVB. - Marked 1AVB persists. Follows with Dr. Ladona Ridgel.   Alford Highland, NP  3:48 PM  Patient seen and examined with the above-signed Advanced Practice Provider and/or Housestaff. I personally reviewed laboratory data, imaging studies and relevant notes. I independently examined the patient and formulated the important aspects of the plan. I have edited the note to reflect any of my changes or salient points. I have personally discussed the plan with the patient and/or family.  Overall doing well. NYHA II. Volume status perhaps slightly elevated. Continue torsemide 20mg  qod and take extra as needed. BP up today. Increase Bid to 1 tid. Repeat echo at next visit. Will complete his Genetics eval 08/23/18.   Arvilla Meres, MD  4:05  PM

## 2018-08-13 NOTE — Addendum Note (Signed)
Encounter addended by: Noralee Space, RN on: 08/13/2018 4:10 PM  Actions taken: Order list changed, Sign clinical note

## 2018-08-13 NOTE — Addendum Note (Signed)
Encounter addended by: Noralee Space, RN on: 08/13/2018 4:17 PM  Actions taken: Order list changed, Diagnosis association updated

## 2018-08-20 ENCOUNTER — Other Ambulatory Visit (HOSPITAL_COMMUNITY): Payer: Self-pay | Admitting: Internal Medicine

## 2018-08-23 ENCOUNTER — Ambulatory Visit: Payer: BLUE CROSS/BLUE SHIELD | Admitting: Genetic Counselor

## 2018-08-23 DIAGNOSIS — I428 Other cardiomyopathies: Secondary | ICD-10-CM

## 2018-08-28 NOTE — Progress Notes (Addendum)
  Bradley Powell is here today to complete the informed consent and other paper work for genetic testing. We went over his family pedigree and he confirms that there have been no changes to their medical history since we last met.  Blood was drawn today and sent out for genetic testing.  Bradley Powell, Ph.D, [facility] Clinical Molecular Geneticist

## 2018-09-17 DIAGNOSIS — I428 Other cardiomyopathies: Secondary | ICD-10-CM | POA: Diagnosis not present

## 2018-09-18 DIAGNOSIS — I428 Other cardiomyopathies: Secondary | ICD-10-CM | POA: Diagnosis not present

## 2018-09-19 DIAGNOSIS — I428 Other cardiomyopathies: Secondary | ICD-10-CM | POA: Diagnosis not present

## 2018-09-20 DIAGNOSIS — I428 Other cardiomyopathies: Secondary | ICD-10-CM | POA: Diagnosis not present

## 2018-09-21 DIAGNOSIS — I428 Other cardiomyopathies: Secondary | ICD-10-CM | POA: Diagnosis not present

## 2018-09-24 ENCOUNTER — Other Ambulatory Visit (HOSPITAL_COMMUNITY): Payer: Self-pay | Admitting: Internal Medicine

## 2018-09-24 DIAGNOSIS — I428 Other cardiomyopathies: Secondary | ICD-10-CM | POA: Diagnosis not present

## 2018-09-26 ENCOUNTER — Other Ambulatory Visit (HOSPITAL_COMMUNITY): Payer: Self-pay | Admitting: Pharmacist

## 2018-09-26 DIAGNOSIS — I428 Other cardiomyopathies: Secondary | ICD-10-CM | POA: Diagnosis not present

## 2018-09-26 MED ORDER — COLCHICINE 0.6 MG PO TABS: 0.6000 mg | ORAL_TABLET | Freq: Every day | ORAL | 5 refills | Status: DC | PRN

## 2018-09-27 DIAGNOSIS — I428 Other cardiomyopathies: Secondary | ICD-10-CM | POA: Diagnosis not present

## 2018-09-28 DIAGNOSIS — I428 Other cardiomyopathies: Secondary | ICD-10-CM | POA: Diagnosis not present

## 2018-10-15 ENCOUNTER — Encounter (HOSPITAL_COMMUNITY): Payer: BLUE CROSS/BLUE SHIELD | Admitting: Internal Medicine

## 2018-10-15 ENCOUNTER — Ambulatory Visit (HOSPITAL_COMMUNITY): Admission: RE | Admit: 2018-10-15 | Payer: BLUE CROSS/BLUE SHIELD | Source: Ambulatory Visit

## 2018-10-17 ENCOUNTER — Other Ambulatory Visit (HOSPITAL_COMMUNITY): Payer: Self-pay | Admitting: Pharmacist

## 2018-10-17 MED ORDER — COLCHICINE 0.6 MG PO TABS: 0.6000 mg | ORAL_TABLET | Freq: Every day | ORAL | 5 refills | Status: DC | PRN

## 2018-10-18 ENCOUNTER — Other Ambulatory Visit (HOSPITAL_COMMUNITY): Payer: Self-pay | Admitting: Pharmacist

## 2018-10-18 MED ORDER — MITIGARE 0.6 MG PO CAPS: 0.6000 mg | ORAL_CAPSULE | Freq: Every day | ORAL | 5 refills | Status: DC | PRN

## 2018-11-18 ENCOUNTER — Other Ambulatory Visit (HOSPITAL_COMMUNITY): Payer: Self-pay | Admitting: Internal Medicine

## 2018-11-20 ENCOUNTER — Other Ambulatory Visit (HOSPITAL_COMMUNITY): Payer: Self-pay | Admitting: Internal Medicine

## 2018-11-26 ENCOUNTER — Encounter (HOSPITAL_COMMUNITY): Payer: Self-pay | Admitting: Internal Medicine

## 2018-11-26 ENCOUNTER — Other Ambulatory Visit: Payer: Self-pay

## 2018-11-26 ENCOUNTER — Ambulatory Visit (HOSPITAL_COMMUNITY)
Admission: RE | Admit: 2018-11-26 | Discharge: 2018-11-26 | Disposition: A | Discharge status: Home / Self Care | Payer: BLUE CROSS/BLUE SHIELD | Source: Ambulatory Visit | Attending: Internal Medicine | Admitting: Internal Medicine

## 2018-11-26 ENCOUNTER — Ambulatory Visit (HOSPITAL_BASED_OUTPATIENT_CLINIC_OR_DEPARTMENT_OTHER)
Admission: RE | Admit: 2018-11-26 | Discharge: 2018-11-26 | Disposition: A | Discharge status: Home / Self Care | Payer: BLUE CROSS/BLUE SHIELD | Source: Ambulatory Visit | Attending: Internal Medicine | Admitting: Internal Medicine

## 2018-11-26 VITALS — BP 82/62 | HR 96 | Wt 324.2 lb

## 2018-11-26 DIAGNOSIS — I5022 Chronic systolic (congestive) heart failure: Secondary | ICD-10-CM

## 2018-11-26 DIAGNOSIS — I428 Other cardiomyopathies: Secondary | ICD-10-CM

## 2018-11-26 DIAGNOSIS — Z6841 Body Mass Index (BMI) 40.0 and over, adult: Secondary | ICD-10-CM | POA: Insufficient documentation

## 2018-11-26 DIAGNOSIS — I272 Pulmonary hypertension, unspecified: Secondary | ICD-10-CM | POA: Diagnosis not present

## 2018-11-26 DIAGNOSIS — I4589 Other specified conduction disorders: Secondary | ICD-10-CM | POA: Insufficient documentation

## 2018-11-26 DIAGNOSIS — I480 Paroxysmal atrial fibrillation: Secondary | ICD-10-CM | POA: Diagnosis not present

## 2018-11-26 DIAGNOSIS — I110 Hypertensive heart disease with heart failure: Secondary | ICD-10-CM | POA: Diagnosis not present

## 2018-11-26 DIAGNOSIS — M109 Gout, unspecified: Secondary | ICD-10-CM | POA: Diagnosis not present

## 2018-11-26 DIAGNOSIS — G4730 Sleep apnea, unspecified: Secondary | ICD-10-CM | POA: Insufficient documentation

## 2018-11-26 DIAGNOSIS — Z79899 Other long term (current) drug therapy: Secondary | ICD-10-CM | POA: Diagnosis not present

## 2018-11-26 DIAGNOSIS — I5042 Chronic combined systolic (congestive) and diastolic (congestive) heart failure: Secondary | ICD-10-CM | POA: Insufficient documentation

## 2018-11-26 DIAGNOSIS — Z8249 Family history of ischemic heart disease and other diseases of the circulatory system: Secondary | ICD-10-CM | POA: Diagnosis not present

## 2018-11-26 DIAGNOSIS — Z9889 Other specified postprocedural states: Secondary | ICD-10-CM | POA: Diagnosis not present

## 2018-11-26 DIAGNOSIS — I4892 Unspecified atrial flutter: Secondary | ICD-10-CM | POA: Diagnosis not present

## 2018-11-26 DIAGNOSIS — I959 Hypotension, unspecified: Secondary | ICD-10-CM

## 2018-11-26 LAB — BASIC METABOLIC PANEL
ANION GAP: 10 (ref 5–15)
BUN: 15 mg/dL (ref 6–20)
CO2: 22 mmol/L (ref 22–32)
Calcium: 9 mg/dL (ref 8.9–10.3)
Chloride: 104 mmol/L (ref 98–111)
Creatinine, Ser: 1.26 mg/dL — ABNORMAL HIGH (ref 0.61–1.24)
GFR calc Af Amer: >60 mL/min (ref >60)
Glucose, Bld: 120 mg/dL — ABNORMAL HIGH (ref 70–99)
POTASSIUM: 4.7 mmol/L (ref 3.5–5.1)
Sodium: 136 mmol/L (ref 135–145)

## 2018-11-26 LAB — CBC
HCT: 38.3 % — ABNORMAL LOW (ref 39.0–52.0)
HEMOGLOBIN: 12.3 g/dL — AB (ref 13.0–17.0)
MCH: 29.6 pg (ref 26.0–34.0)
MCHC: 32.1 g/dL (ref 30.0–36.0)
MCV: 92.3 fL (ref 80.0–100.0)
PLATELETS: 154 10*3/uL (ref 150–400)
RBC: 4.15 MIL/uL — ABNORMAL LOW (ref 4.22–5.81)
RDW: 14.5 % (ref 11.5–15.5)
WBC: 5.8 10*3/uL (ref 4.0–10.5)
nRBC: 0 % (ref 0.0–0.2)

## 2018-11-26 MED ORDER — TORSEMIDE 20 MG PO TABS: 20.0000 mg | ORAL_TABLET | Freq: Every day | ORAL | 2 refills | Status: DC

## 2018-11-26 NOTE — Patient Instructions (Signed)
STOP BIDIL  Labs today We will only contact you if something comes back abnormal or we need to make some changes. Otherwise no news is good news!  Your physician recommends that you schedule a follow-up appointment in: 1 week with Nurses and 6-8 weeks with Dr. [name].  **Please call Dr. Bruna Potter office to schedule an appointment with him with this month.  You should see Dr. Ladona Ridgel prior to your follow up with Dr. [name].

## 2018-11-26 NOTE — Progress Notes (Signed)
Advanced Heart Failure Clinic Note   PCP: Dr Nehemiah SettlePolite  Primary HF Cardiologist: Dr Gala RomneyBensimhon  HPI: Bradley Powell is a 38 y.o. male with a history of combined systolic/diastolic heart failure,  obesity, HTN, and gout.   Admitted 4/13 through 03/07/17 with marked volume overload. EF 40-45%. Diuresed with IV lasix tid + metolazone. Had RHC/LHC as noted below. Overall diuresed 45 pounds. On the day of discharge carvedilol was restarted. Discharge weight was 281 pounds.   He underwent atrial flutter ablation on 04/06/18.  He presents today for regular follow up. At last visit Bidil increase to 1 tab TID. Weight down 16 lbs from last visit. BP very low on presentation today, checked by 3 separate people on 2 different occasions. He only complains of rare dizziness with bending to standing or heavy lifting, not daily, or even weekly. Takes torsemide every other day, and takes an extra on the weekends every 2-3 weeks. Exercises everyday, walks on the TM for about a mile, jumps rope for 5-10 minutes. He states his HR goes up to 120-130s during exercise. Feels occasional tightness in his abdomen with volume overload. Watching salt and fluid intake. Watching carbohydrates and red meat.   Echo today EF 30-35% on preliminary review  Echo 9/18 EF ~25-30%.  Echo 3/19: EF 45-50%, reviewed by Dr Gala RomneyBensimhon  Holter 7/18: 6% PVCs  Sleep [facility]HI 26/hr  [facility]  AO = 109/86 (97) LV =  101/28 RA =  29 RV = 50/22 PA = 55/24 (38) PCW = 26 Fick cardiac output/index = 6.4/2.6 Thermal CO/CI = 4.9/2.0 PVR = 2.5 WU FA sat = 98% PA sat = 69%, 71% SVC sat = 82%, 82% IVC sat = 66% Assessment: 1. Normal coronary arteries  2. Mild to moderate pulmonary HTN with normal PVR 3. Biventricular volume overload with R > L heart failure 4. Moderately decreased CO by thermodilution  5. Evidence of probable anomalous PV drainage to SVC versus intrapulmonary O2 shunt (which can be seen in obese  patients) 6. No intra-cardiac shunt  Review of systems complete and found to be negative unless listed in HPI.    SH:  Social History   Socioeconomic History  . Marital status: Single    Spouse name: Not on file  . Number of children: Not on file  . Years of education: Not on file  . Highest education level: Not on file  Occupational History  . Not on file  Social Needs  . Financial resource strain: Not on file  . Food insecurity:    Worry: Not on file    Inability: Not on file  . Transportation needs:    Medical: Not on file    Non-medical: Not on file  Tobacco Use  . Smoking status: Never Smoker  . Smokeless tobacco: Never Used  Substance and Sexual Activity  . Alcohol use: Yes    Comment: rarely  . Drug use: No  . Sexual activity: Not on file  Lifestyle  . Physical activity:    Days per week: Not on file    Minutes per session: Not on file  . Stress: Not on file  Relationships  . Social connections:    Talks on phone: Not on file    Gets together: Not on file    Attends religious service: Not on file    Active member of club or organization: Not on file    Attends meetings of clubs or organizations: Not on file  Relationship status: Not on file  . Intimate partner violence:    Fear of current or ex partner: Not on file    Emotionally abused: Not on file    Physically abused: Not on file    Forced sexual activity: Not on file  Other Topics Concern  . Not on file  Social History Narrative  . Not on file   FH:  Family History  Problem Relation Age of Onset  . CVA Mother        pacemaker  . Multiple sclerosis Mother   . Seizures Mother   . Hypertension Father   . Gout Father    Past Medical History:  Diagnosis Date  . Atrial flutter (HCC)    a. s/p ablation 03/2018.  Marland Kitchen Chronic combined systolic and diastolic CHF (congestive heart failure) (HCC)   . History of esophagogastroduodenoscopy (EGD)   . Morbid obesity (HCC)   . NICM (nonischemic  cardiomyopathy) (HCC)   . PVC's (premature ventricular contractions)   . Sleep apnea    Current Outpatient Medications  Medication Sig Dispense Refill  . acetaminophen (TYLENOL) 500 MG tablet Take 1,000 mg by mouth every 6 (six) hours as needed for moderate pain or headache.     . allopurinol (ZYLOPRIM) 100 MG tablet Take 2 tablets (200 mg total) by mouth daily. Please contact PCP for further refills 60 tablet 0  . carvedilol (COREG) 12.5 MG tablet TAKE 1.5 TABLETS (18.75 MG TOTAL) BY MOUTH 2 (TWO) TIMES DAILY WITH A MEAL. 270 tablet 2  . isosorbide-hydrALAZINE (BIDIL) 20-37.5 MG tablet Take 1 tablet by mouth 3 (three) times daily. 90 tablet 5  . losartan (COZAAR) 50 MG tablet TAKE ONE (1) TABLET BY MOUTH EVERY NIGHT AT BEDTIME 90 tablet 2  . MITIGARE 0.6 MG CAPS Take 0.6 mg by mouth daily as needed (gout flare). 30 capsule 5  . spironolactone (ALDACTONE) 25 MG tablet TAKE ONE TABLET BY MOUTH EVERY NIGHT AT BEDTIME 90 tablet 1  . torsemide (DEMADEX) 20 MG tablet TAKE 1 TABLET (20 MG TOTAL) BY MOUTH EVERY OTHER DAY. 45 tablet 2   No current facility-administered medications for this encounter.    Vitals:   11/26/18 1042 11/26/18 1043  BP: (!) 80/58 (!) 82/62  Pulse: 96   SpO2: 98%   Weight: (!) 147.1 kg (324 lb 3.2 oz)    Wt Readings from Last 3 Encounters:  11/26/18 (!) 147.1 kg (324 lb 3.2 oz)  08/13/18 (!) 154.4 kg (340 lb 6.4 oz)  06/07/18 (!) 148.8 kg (328 lb)   PHYSICAL EXAM: General: NAD HEENT: Normal Neck: Supple. JVP difficult to assess due to body habitus. Carotids 2+ bilat; no bruits. No thyromegaly or nodule noted. Cor: PMI nondisplaced. RRR, No M/G/R noted Lungs: CTAB, normal effort. Abdomen: Obese, soft, non-tender, non-distended, no HSM. No bruits or masses. +BS  Extremities: No cyanosis, clubbing, or rash. Trace ankle edema at most.  Neuro: Alert & orientedx3, cranial nerves grossly intact. moves all 4 extremities w/o difficulty. Affect pleasant   EKG today  with NSR with AV dissociation and junctional rhythm at 77 bpm, personally reviewed  ASSESSMENT & PLAN: 1. Combined Systolic/Diastolic Heart Failure- ECHO 02/22/2017 EF 40-45% Grade II DD. Peak PA pressure 46 mmhg. Echo 9/18 EF ~20-25%. Echo 01/2018: EF 40-45%  - Echo today shows EF 30-35% with mild RV dysfunction, personally reviewed by Dr. [name].  - Reports NYHA II symptoms.   - Volume status looks OK on exam.  - Continue torsemide 20 mg  every other day. Encouraged to take extra as needed  - Continue carvedilol 18.75 mg BID - Continue spiro 25 mg daily.  - Stop Bidil with hypotension. BP check one week.  - Continue losartan 50 mg qhs. No room to increase today. Will not rechallenge with Entresto given previous severe orthostasis and nausea - Too large for cMRI.  - Has seen Dr. Jomarie Longs for Genetics Counseling. Had blood drawn 08/23/2018. He has not heard back from her.  - EF now < 35% with significant conduction system delay. He will need to see EP for possible PPM/ICD.  2. Sleep Apnea - Continue CPAP. He is wearing every night.  3. Morbid Obesity: - Body mass index is 49.29 kg/m.   4. Gout - Continue allopurinol 5. PVCs  - ? If cardiomyopathy in part PVC-related. 24-hour monitor  7/18  PVCs 6% -  NSR with AV dissociation by EKG today. EP as below.  6. Atrial flutter s/p aflutter ablation 04/06/18 - EKG today with NSR with AV dissociation and junctional rhythm at 77 bpm. Needs follow up with Dr. Ladona Ridgel.   Labs today. RN BP check 1 week. RTC 6-8 weeks, sooner with worsening symptoms. Ideally, needs to see Dr. Ladona Ridgel in next 4-6 weeks prior to follow up so can discuss ICD/PPM if needed.   Bradley Freer, PA-C  10:48 AM  Patient seen and examined with the above-signed Advanced Practice Provider and/or Housestaff. I personally reviewed laboratory data, imaging studies and relevant notes. I independently examined the patient and formulated the important aspects of the plan. I have  edited the note to reflect any of my changes or salient points. I have personally discussed the plan with the patient and/or family.  Clinically doing well. NYHA II. Volume status looks good. I have reviewed echo today and EF appears to be in 30-35% range but hard to assess with PVCs. Will review with echo team. BP very low today but asymptomatic. Will start Bidil. ECG shows sinus rhythm with AV dissociation and junctional rhythm. (developed marked 1 AVB after AFL ablation). Will refer to EP to further evaluate.   Arvilla Meres, MD  3:43 PM

## 2018-11-26 NOTE — Progress Notes (Signed)
  Echocardiogram 2D Echocardiogram has been performed.  Leta Jungling M 11/26/2018, 10:29 AM

## 2018-12-03 ENCOUNTER — Ambulatory Visit (HOSPITAL_COMMUNITY)
Admission: RE | Admit: 2018-12-03 | Discharge: 2018-12-03 | Disposition: A | Discharge status: Home / Self Care | Payer: BLUE CROSS/BLUE SHIELD | Source: Ambulatory Visit | Attending: Internal Medicine | Admitting: Internal Medicine

## 2018-12-03 VITALS — BP 102/66 | HR 81 | Wt 323.2 lb

## 2018-12-03 DIAGNOSIS — I959 Hypotension, unspecified: Secondary | ICD-10-CM | POA: Diagnosis not present

## 2018-12-03 NOTE — Progress Notes (Signed)
Pt came in for bp check. L arm was 102/66 and R arm was 98/68. Vitals were good.

## 2018-12-17 ENCOUNTER — Telehealth: Payer: Self-pay

## 2018-12-17 NOTE — Telephone Encounter (Signed)
Spoke to Bradley Powell about the 12-week, twice weekly PREP at the [facility].  He lives closer to the [facility] but needs an evening class which takes place at [employer].  We will touch base in late Feb/early March when new classes will start to make an intake appt.

## 2018-12-20 ENCOUNTER — Other Ambulatory Visit (HOSPITAL_COMMUNITY): Payer: Self-pay | Admitting: Internal Medicine

## 2018-12-20 ENCOUNTER — Other Ambulatory Visit (HOSPITAL_COMMUNITY): Payer: Self-pay

## 2018-12-20 MED ORDER — LOSARTAN POTASSIUM 100 MG PO TABS: 50.0000 mg | ORAL_TABLET | Freq: Every day | ORAL | 6 refills | Status: DC

## 2018-12-20 NOTE — Telephone Encounter (Signed)
Changed Losartan from 50mg  (1tab a day) to 100mg  (0.5 tab a day) per pharm request due to national shortage.

## 2018-12-20 NOTE — Telephone Encounter (Signed)
Refills should be through PCP

## 2018-12-21 ENCOUNTER — Other Ambulatory Visit (HOSPITAL_COMMUNITY): Payer: Self-pay

## 2018-12-27 ENCOUNTER — Ambulatory Visit: Payer: BLUE CROSS/BLUE SHIELD | Admitting: Genetic Counselor

## 2018-12-28 ENCOUNTER — Ambulatory Visit (INDEPENDENT_AMBULATORY_CARE_PROVIDER_SITE_OTHER): Payer: BLUE CROSS/BLUE SHIELD | Admitting: Internal Medicine

## 2018-12-28 ENCOUNTER — Encounter: Payer: Self-pay | Admitting: Internal Medicine

## 2018-12-28 VITALS — BP 124/66 | HR 82 | Ht 68.0 in | Wt 329.0 lb

## 2018-12-28 DIAGNOSIS — I5042 Chronic combined systolic (congestive) and diastolic (congestive) heart failure: Secondary | ICD-10-CM | POA: Diagnosis not present

## 2018-12-28 DIAGNOSIS — G4730 Sleep apnea, unspecified: Secondary | ICD-10-CM | POA: Diagnosis not present

## 2018-12-28 DIAGNOSIS — I4892 Unspecified atrial flutter: Secondary | ICD-10-CM

## 2018-12-28 NOTE — Progress Notes (Signed)
HPI Bradley Powell returns today for ongoing evaluation and management of his atrial flutter, s/p EPS/RFA of atrial flutter. He is walking up to 2 miles.  He denies medical non-compliance. He denies chest pain. He has class 2. Dyspnea. No edema. He was noted by Dr. Dorthea Cove to have progressive worsening of his conduction system so that on his last ECG he had NSR with a PR of over 450 ms. He has not had syncope. His repeat echo showed an EF of 40%.  Allergies  Allergen Reactions  . Entresto [Sacubitril-Valsartan] Nausea And Vomiting and Other (See Comments)    Lightheaded, fainting     Current Outpatient Medications  Medication Sig Dispense Refill  . acetaminophen (TYLENOL) 500 MG tablet Take 1,000 mg by mouth every 6 (six) hours as needed for moderate pain or headache.     . allopurinol (ZYLOPRIM) 100 MG tablet Take 2 tablets (200 mg total) by mouth daily. Please contact PCP for further refills 60 tablet 0  . carvedilol (COREG) 12.5 MG tablet TAKE 1.5 TABLETS (18.75 MG TOTAL) BY MOUTH 2 (TWO) TIMES DAILY WITH A MEAL. 270 tablet 2  . losartan (COZAAR) 100 MG tablet Take 0.5 tablets (50 mg total) by mouth daily. 15 tablet 6  . MITIGARE 0.6 MG CAPS Take 0.6 mg by mouth daily as needed (gout flare). 30 capsule 5  . spironolactone (ALDACTONE) 25 MG tablet TAKE ONE TABLET BY MOUTH EVERY NIGHT AT BEDTIME 90 tablet 1  . torsemide (DEMADEX) 20 MG tablet Take 1 tablet (20 mg total) by mouth daily. Take one tablet daily as needed 45 tablet 2   No current facility-administered medications for this visit.      Past Medical History:  Diagnosis Date  . Atrial flutter (HCC)    a. s/p ablation 03/2018.  Marland Kitchen Chronic combined systolic and diastolic CHF (congestive heart failure) (HCC)   . History of esophagogastroduodenoscopy (EGD)   . Morbid obesity (HCC)   . NICM (nonischemic cardiomyopathy) (HCC)   . PVC's (premature ventricular contractions)   . Sleep apnea     ROS:   All systems reviewed and  negative except as noted in the HPI.   Past Surgical History:  Procedure Laterality Date  . A-FLUTTER ABLATION N/A 04/06/2018   Procedure: A-FLUTTER ABLATION;  Surgeon: Marinus Maw, MD;  Location: [facility] INVASIVE CV LAB;  Service: Cardiovascular;  Laterality: N/A;  . NO PAST SURGERIES    . RIGHT/LEFT HEART CATH AND CORONARY ANGIOGRAPHY N/A 03/01/2017   Procedure: Right/Left Heart Cath and Coronary Angiography;  Surgeon: Dolores Patty, MD;  Location: [facility] INVASIVE CV LAB;  Service: Cardiovascular;  Laterality: N/A;     Family History  Problem Relation Age of Onset  . CVA Mother        pacemaker  . Multiple sclerosis Mother   . Seizures Mother   . Hypertension Father   . Gout Father      Social History   Socioeconomic History  . Marital status: Single    Spouse name: Not on file  . Number of children: Not on file  . Years of education: Not on file  . Highest education level: Not on file  Occupational History  . Not on file  Social Needs  . Financial resource strain: Not on file  . Food insecurity:    Worry: Not on file    Inability: Not on file  . Transportation needs:    Medical: Not on file  Non-medical: Not on file  Tobacco Use  . Smoking status: Never Smoker  . Smokeless tobacco: Never Used  Substance and Sexual Activity  . Alcohol use: Yes    Comment: rarely  . Drug use: No  . Sexual activity: Not on file  Lifestyle  . Physical activity:    Days per week: Not on file    Minutes per session: Not on file  . Stress: Not on file  Relationships  . Social connections:    Talks on phone: Not on file    Gets together: Not on file    Attends religious service: Not on file    Active member of club or organization: Not on file    Attends meetings of clubs or organizations: Not on file    Relationship status: Not on file  . Intimate partner violence:    Fear of current or ex partner: Not on file    Emotionally abused: Not on file    Physically abused: Not  on file    Forced sexual activity: Not on file  Other Topics Concern  . Not on file  Social History Narrative  . Not on file     BP 124/66   Pulse 82   Ht 5\' 8"  (1.727 m)   Wt (!) 329 lb (149.2 kg)   SpO2 99%   BMI 50.02 kg/m   Physical Exam:  obese appearing NAD HEENT: Unremarkable Neck:  7 cm JVD, no thyromegally Lymphatics:  No adenopathy Back:  No CVA tenderness Lungs:  Clear with no wheezes HEART:  Regular rate rhythm, no murmurs, no rubs, no clicks Abd:  soft, positive bowel sounds, no organomegally, no rebound, no guarding Ext:  2 plus pulses, no edema, no cyanosis, no clubbing Skin:  No rashes no nodules Neuro:  CN II through XII intact, motor grossly intact  EKG - reviewed from 1/20 -nsr with marked first degree AV block  Assess/Plan: 1. Marked first degree AV block - he has had progressive conduction system disease with marked prolongation. I am sure he has some diastolic MR. He is on a significant dose of coreg which may be exacerbating this. 2. Chronic systolic heart failure - his echo EF was read as 40%. I will defer repeat echo vs weight loss and another attempt at a cardiac MR to more definitively quantitate his LV function. For now he does not have an indication for either an ICD or a biv PPM. 3. Atrial flutter - he is s/p catheter ablation and has not yet had a recurrence.  4. Obesity - I have strongly encouraged the patient to lose weight.   Leonia Reeves.D.

## 2018-12-28 NOTE — Patient Instructions (Addendum)
Medication Instructions:  Your physician recommends that you continue on your current medications as directed. Please refer to the Current Medication list given to you today.  Labwork: None ordered.  Testing/Procedures: None ordered.  Follow-Up: Your physician wants you to follow-up in: as needed with Dr. Taylor.      Any Other Special Instructions Will Be Listed Below (If Applicable).  If you need a refill on your cardiac medications before your next appointment, please call your pharmacy.   

## 2019-01-13 ENCOUNTER — Other Ambulatory Visit (HOSPITAL_COMMUNITY): Payer: Self-pay | Admitting: Internal Medicine

## 2019-01-21 ENCOUNTER — Encounter (HOSPITAL_COMMUNITY): Payer: BLUE CROSS/BLUE SHIELD | Admitting: Internal Medicine

## 2019-01-29 NOTE — Progress Notes (Signed)
Post test Genetic Consult  Isaak Folk is here for his post-test genetic consult. We first reviewed his pedigree and he affirmed that there have been no changes to his family's medical history.   I informed them that he does not have a pathogenic variant for HCM. I explained the reasons as to why his genetic test was negative. He was disappointed to hear this as he has a very remarkable family history of heart disease in his maternal lineage and he is still grappling with having NICM and the excessive weight gain in the last one year. Additionally, he qualifies for the Cardiomyopathy Study at [facility] as he is phenotype positive for cardiomyopathy but genotype negative. I reiterated that he does have a genetic condition and, with increasing numbers of patients enrolling in this study the knowledgebase will significantly improve our understanding allowing future generations to be tested with a higher positive outcome.   Plan Abdinasir is interested in enrolling in the Cardiomyopathy Study at [facility]. He would like to review the information packet from [facility] and will contact us when is ready to enroll.    Sidney Ace, Ph.D, [facility] Clinical Molecular Geneticist

## 2019-02-07 ENCOUNTER — Other Ambulatory Visit (HOSPITAL_COMMUNITY): Payer: Self-pay

## 2019-02-07 MED ORDER — SPIRONOLACTONE 25 MG PO TABS: 25.0000 mg | ORAL_TABLET | Freq: Every day | ORAL | 1 refills | Status: DC

## 2019-02-14 ENCOUNTER — Other Ambulatory Visit: Payer: Self-pay

## 2019-02-14 ENCOUNTER — Other Ambulatory Visit: Payer: Self-pay | Admitting: Internal Medicine

## 2019-02-14 ENCOUNTER — Ambulatory Visit
Admission: RE | Admit: 2019-02-14 | Discharge: 2019-02-14 | Disposition: A | Discharge status: Home / Self Care | Payer: BLUE CROSS/BLUE SHIELD | Source: Ambulatory Visit | Attending: Internal Medicine | Admitting: Internal Medicine

## 2019-02-14 DIAGNOSIS — R112 Nausea with vomiting, unspecified: Secondary | ICD-10-CM

## 2019-02-14 IMAGING — CR ABDOMEN - 2 VIEW
4 series · 4 of 4 positions shown · non-contrast
Comparison: None.

CLINICAL DATA: 37-year-old male with nausea and vomiting

EXAM:
ABDOMEN - 2 VIEW

[t abdomen supine (1 of 2)]
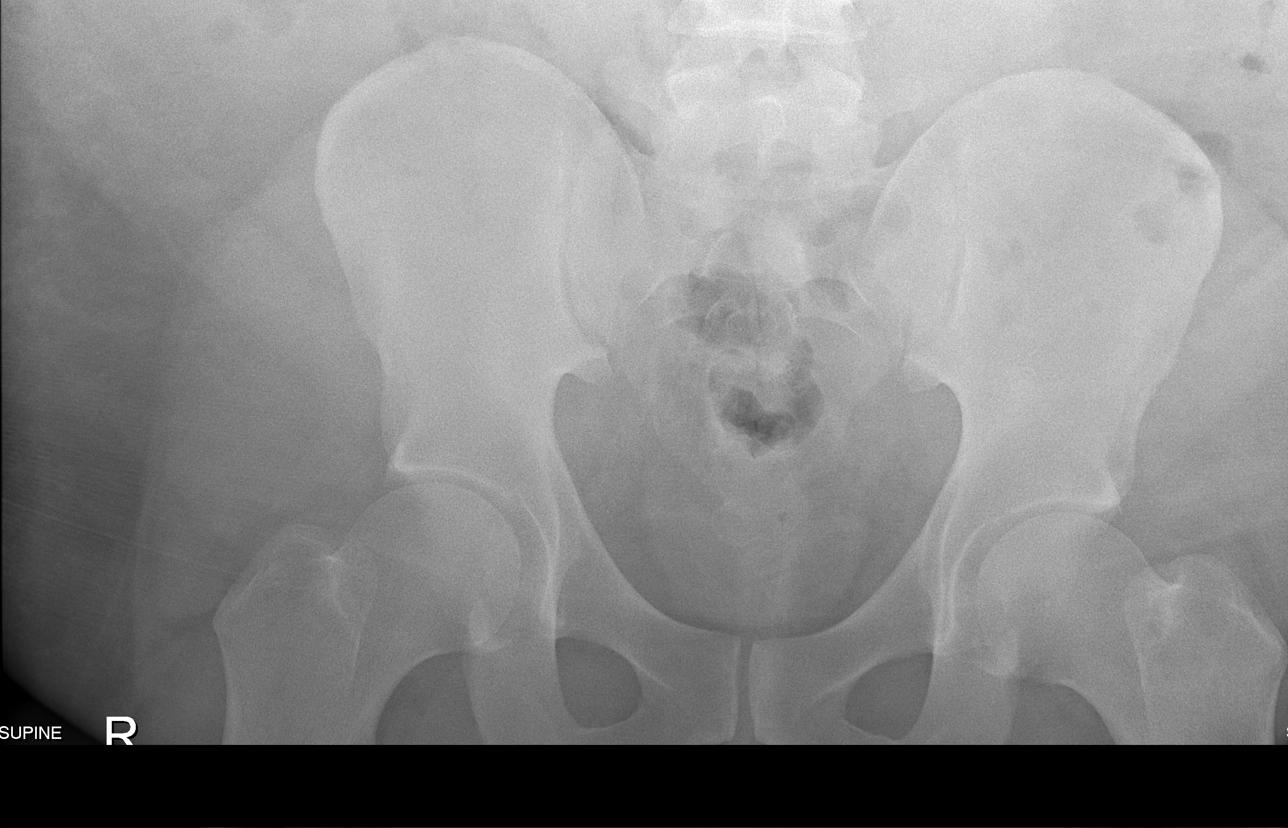

[t abdomen supine (2 of 2)]
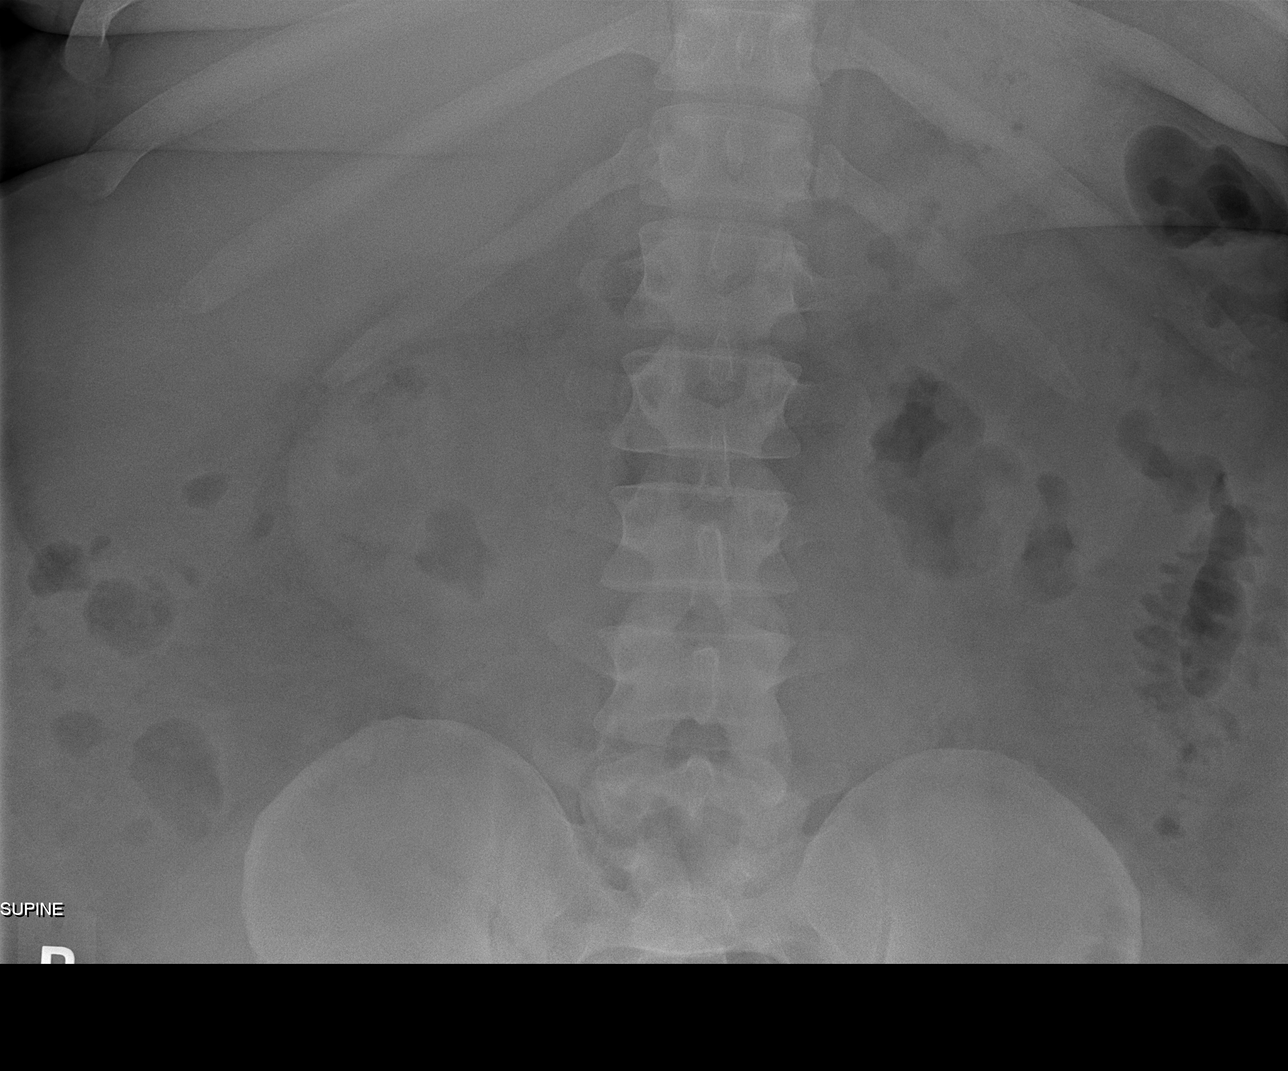

[w abdomen upright (1 of 2)]
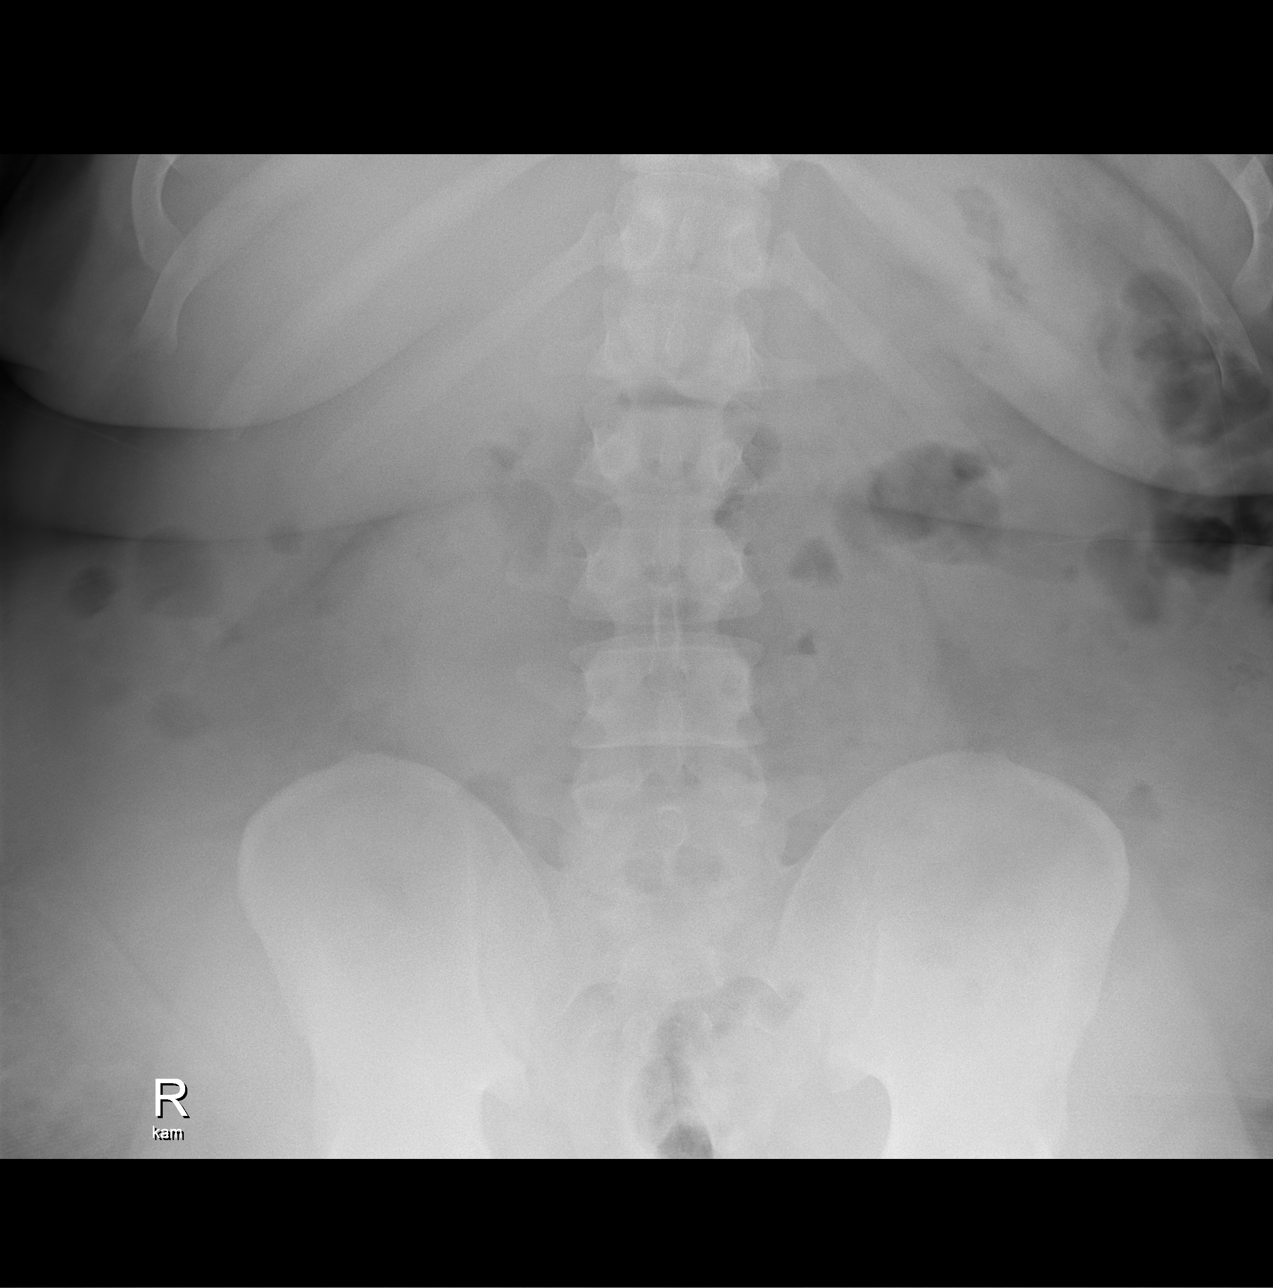

[w abdomen upright (2 of 2)]
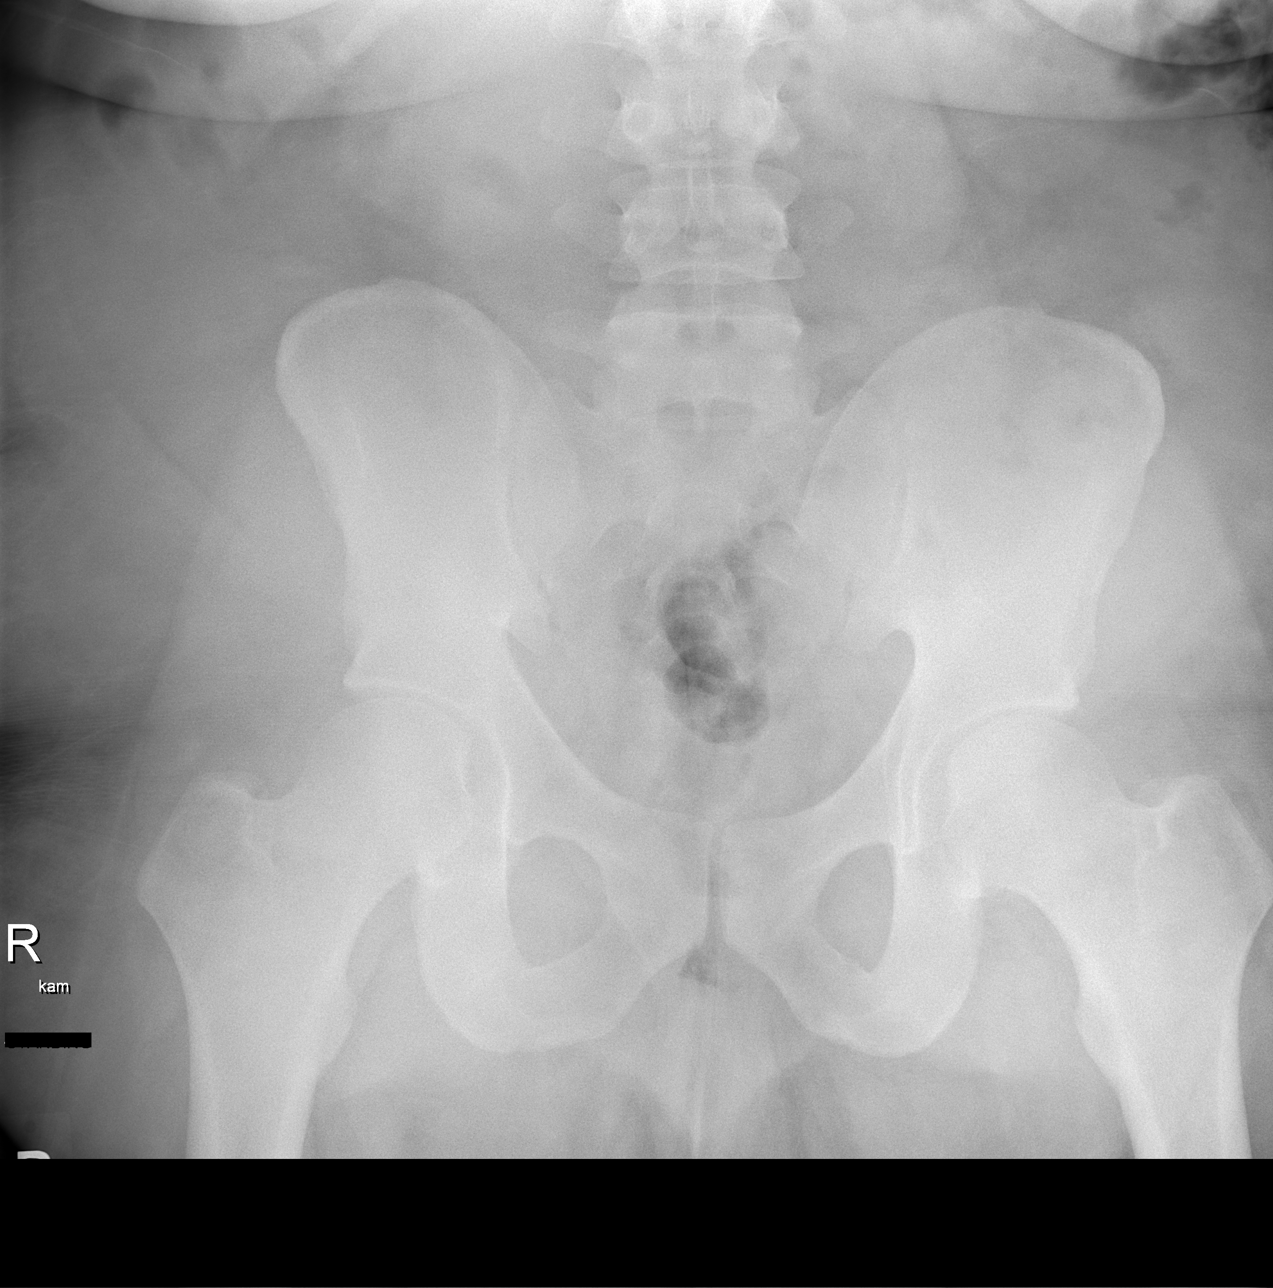

[4 of 4 positions shown; findings below may reference images not displayed]

FINDINGS: No evidence of bowel obstruction. The bowel gas pattern is not
specific. Scattered gas is present within both large and small
bowel. There are a few loops of mildly prominent small bowel in the
left mid abdomen which are nonspecific. No large free air,
organomegaly or abnormal calcification. No acute osseous
abnormality.
IMPRESSION: Negative.

## 2019-02-15 ENCOUNTER — Other Ambulatory Visit: Payer: Self-pay | Admitting: Internal Medicine

## 2019-02-15 DIAGNOSIS — R112 Nausea with vomiting, unspecified: Secondary | ICD-10-CM

## 2019-02-18 ENCOUNTER — Ambulatory Visit
Admission: RE | Admit: 2019-02-18 | Discharge: 2019-02-18 | Disposition: A | Discharge status: Home / Self Care | Payer: BLUE CROSS/BLUE SHIELD | Source: Ambulatory Visit | Attending: Internal Medicine | Admitting: Internal Medicine

## 2019-02-18 ENCOUNTER — Other Ambulatory Visit: Payer: Self-pay

## 2019-02-18 DIAGNOSIS — R112 Nausea with vomiting, unspecified: Secondary | ICD-10-CM | POA: Diagnosis not present

## 2019-02-19 ENCOUNTER — Other Ambulatory Visit: Payer: Self-pay | Admitting: Internal Medicine

## 2019-02-19 ENCOUNTER — Other Ambulatory Visit (HOSPITAL_COMMUNITY): Payer: Self-pay | Admitting: Internal Medicine

## 2019-02-19 DIAGNOSIS — R112 Nausea with vomiting, unspecified: Secondary | ICD-10-CM

## 2019-02-21 ENCOUNTER — Encounter (HOSPITAL_COMMUNITY)
Admission: RE | Admit: 2019-02-21 | Discharge: 2019-02-21 | Disposition: A | Discharge status: Home / Self Care | Payer: BLUE CROSS/BLUE SHIELD | Source: Ambulatory Visit | Attending: Internal Medicine | Admitting: Internal Medicine

## 2019-02-21 ENCOUNTER — Other Ambulatory Visit: Payer: Self-pay

## 2019-02-21 DIAGNOSIS — R112 Nausea with vomiting, unspecified: Secondary | ICD-10-CM | POA: Insufficient documentation

## 2019-02-21 IMAGING — NM NUCLEAR MEDICINE HEPATOBILIARY IMAGING WITH GALLBLADDER EF
3 series · 18 of 18 positions shown · non-contrast
Comparison: Right upper quadrant ultrasound [DATE].

CLINICAL DATA: Nausea and vomiting for 2 weeks.

EXAM:
NUCLEAR MEDICINE HEPATOBILIARY IMAGING WITH GALLBLADDER EF
TECHNIQUE: Sequential images of the abdomen were obtained [DATE] minutes
following intravenous administration of radiopharmaceutical. After
oral ingestion of 8 ounces of Ensure, gallbladder ejection fraction
was determined.
RADIOPHARMACEUTICALS:  5.0 mCi [QH] Choletec IV

[Series 1: he hepatobiliary_motion_corrected motion corrected · 4.52mm/px · 6 of 60 frames shown]
[frame 6/60]
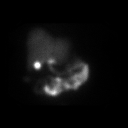
[frame 16/60]
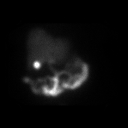
[frame 26/60]
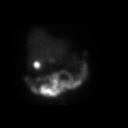
[frame 36/60]
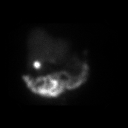
[frame 46/60]
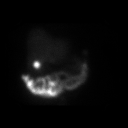
[frame 56/60]
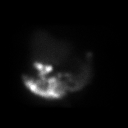

[he hepatobiliary · 4.52mm/px · 6 of 60 frames shown (1 of 2)]
[frame 6/60]
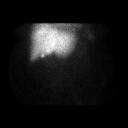
[frame 16/60]
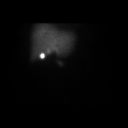
[frame 26/60]
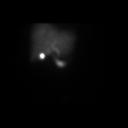
[frame 36/60]
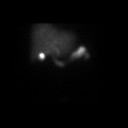
[frame 46/60]
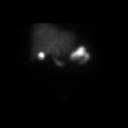
[frame 56/60]
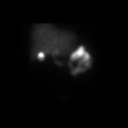

[he hepatobiliary · 4.52mm/px · 6 of 60 frames shown (2 of 2)]
[frame 6/60]
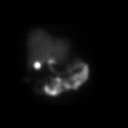
[frame 16/60]
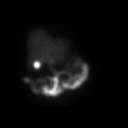
[frame 26/60]
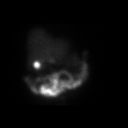
[frame 36/60]
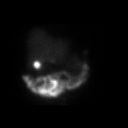
[frame 46/60]
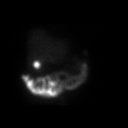
[frame 56/60]
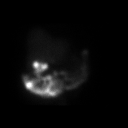

[18 of 18 positions shown; findings below may reference images not displayed]

FINDINGS: Prompt uptake and biliary excretion of activity by the liver is
seen. Gallbladder activity is visualized, consistent with patency of
cystic duct. Biliary activity passes into small bowel, consistent
with patent common bile duct.

Calculated gallbladder ejection fraction is 37%%. (Normal
gallbladder ejection fraction with Ensure is greater than 33%.)
IMPRESSION: Negative exam.

## 2019-02-21 MED ORDER — TECHNETIUM TC 99M MEBROFENIN IV KIT
5.0000 | PACK | Freq: Once | INTRAVENOUS | Status: AC | PRN
  Administered 2019-02-21: 5 via INTRAVENOUS

## 2019-02-28 DIAGNOSIS — R112 Nausea with vomiting, unspecified: Secondary | ICD-10-CM | POA: Diagnosis not present

## 2019-03-05 ENCOUNTER — Other Ambulatory Visit (HOSPITAL_COMMUNITY): Payer: Self-pay

## 2019-03-05 MED ORDER — TORSEMIDE 20 MG PO TABS: 20.0000 mg | ORAL_TABLET | Freq: Every day | ORAL | 3 refills | Status: DC

## 2019-03-19 ENCOUNTER — Ambulatory Visit (HOSPITAL_COMMUNITY)
Admission: RE | Admit: 2019-03-19 | Discharge: 2019-03-19 | Disposition: A | Discharge status: Home / Self Care | Payer: BLUE CROSS/BLUE SHIELD | Source: Ambulatory Visit | Attending: Internal Medicine | Admitting: Internal Medicine

## 2019-03-19 ENCOUNTER — Encounter (HOSPITAL_COMMUNITY): Payer: Self-pay

## 2019-03-19 ENCOUNTER — Other Ambulatory Visit: Payer: Self-pay

## 2019-03-19 DIAGNOSIS — I5022 Chronic systolic (congestive) heart failure: Secondary | ICD-10-CM

## 2019-03-19 DIAGNOSIS — I4892 Unspecified atrial flutter: Secondary | ICD-10-CM

## 2019-03-19 MED ORDER — LOSARTAN POTASSIUM 100 MG PO TABS: 50.0000 mg | ORAL_TABLET | Freq: Every day | ORAL | 3 refills | Status: DC

## 2019-03-19 MED ORDER — CARVEDILOL 12.5 MG PO TABS: 12.5000 mg | ORAL_TABLET | Freq: Two times a day (BID) | ORAL | 3 refills | Status: DC

## 2019-03-19 NOTE — Progress Notes (Signed)
Spoke to pt to review after visit summary instruction. No questions at this time. avs sent through mychart.

## 2019-03-19 NOTE — Progress Notes (Signed)
Heart Failure TeleHealth Note  Due to national recommendations of social distancing due to COVID 19, Audio/video telehealth visit is felt to be most appropriate for this patient at this time.  See MyChart message from today for patient consent regarding telehealth for [facility].  Date:  03/19/2019   ID:  Bradley Powell, DOB [birth year], MRN [id number]  Location: Home  Provider location: [facility] Type of Visit: Established patient  PCP:  [name], MD  Cardiologist:  No primary care provider on file. Primary HF: B[place]imhon  Chief Complaint: Heart Failure follow-up   History of Present Illness:  Bradley Powell is a 38 y.o. male with a history of combined systolic/diastolic heart failure,  obesity, HTN, and gout.   Admitted 4/13 through 03/07/17 with marked volume overload. EF 40-45%. Diuresed with IV lasix tid + metolazone. Had RHC/LHC as noted below. Overall diuresed 45 pounds. Discharge weight was 281 pounds.   He underwent atrial flutter ablation on 04/06/18.  At last visit he had marked 1st AV block with progression to junctional rhythm. Saw Dr. Ladona Ridgel who wanted to follow it. Suggested cutting back carvedilol but not done yet.   He presents via Web designer for a telehealth visit today due to COVID pandemic. Recently Bidil stopped due to hypotension.  Says he is trying to exercise more. Walking about a mile per day at a moderate pace. Also doing some weights and jumping rope No CP, orthopnea or PND. Has lost almost 40 pounds. BP ok. No dizziness.    Echo 1/20: EF 35-40%  Echo 9/18 EF ~25-30%.  Echo 3/19: EF 45-50%, reviewed by Dr [name]  Holter 7/18: 6% PVCs  Sleep study AHI 26/hr  [facility] 03/01/2017  AO = 109/86 (97) LV =  101/28 RA =  29 RV = 50/22 PA = 55/24 (38) PCW = 26 Fick cardiac output/index = 6.4/2.6 Thermal CO/CI = 4.9/2.0 PVR = 2.5 WU FA sat = 98% PA sat = 69%, 71% SVC sat = 82%, 82% IVC  sat = 66% Assessment: 1. Normal coronary arteries  2. Mild to moderate pulmonary HTN with normal PVR 3. Biventricular volume overload with R > L heart failure 4. Moderately decreased CO by thermodilution  5. 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 No intra-cardiac shunt    Bradley Powell denies symptoms worrisome for COVID 19.   Past Medical History:  Diagnosis Date  . Atrial flutter (HCC)    a. s/p ablation 03/2018.  Marland Kitchen Chronic combined systolic and diastolic CHF (congestive heart failure) (HCC)   . History of esophagogastroduodenoscopy (EGD)   . Morbid obesity (HCC)   . NICM (nonischemic cardiomyopathy) (HCC)   . PVC's (premature ventricular contractions)   . Sleep apnea    Past Surgical History:  Procedure Laterality Date  . A-FLUTTER ABLATION N/A 04/06/2018   Procedure: A-FLUTTER ABLATION;  Surgeon: Marinus Maw, MD;  Location: [facility] INVASIVE CV LAB;  Service: Cardiovascular;  Laterality: N/A;  . NO PAST SURGERIES    . RIGHT/LEFT HEART CATH AND CORONARY ANGIOGRAPHY N/A 03/01/2017   Procedure: Right/Left Heart Cath and Coronary Angiography;  Surgeon: Dolores Patty, MD;  Location: [facility] INVASIVE CV LAB;  Service: Cardiovascular;  Laterality: N/A;     Current Outpatient Medications  Medication Sig Dispense Refill  . acetaminophen (TYLENOL) 500 MG tablet Take 1,000 mg by mouth every 6 (six) hours as needed  for moderate pain or headache.     . allopurinol (ZYLOPRIM) 100 MG tablet TAKE 2 TABLETS (200 MG TOTAL) BY MOUTH DAILY. PLEASE CONTACT PCP FOR FURTHER REFILLS 60 tablet 3  . carvedilol (COREG) 12.5 MG tablet TAKE 1.5 TABLETS (18.75 MG TOTAL) BY MOUTH 2 (TWO) TIMES DAILY WITH A MEAL. 270 tablet 2  . losartan (COZAAR) 100 MG tablet Take 0.5 tablets (50 mg total) by mouth daily. 15 tablet 6  . MITIGARE 0.6 MG CAPS Take 0.6 mg by mouth daily as needed (gout flare). 30 capsule 5  .  spironolactone (ALDACTONE) 25 MG tablet Take 1 tablet (25 mg total) by mouth at bedtime. 90 tablet 1  . torsemide (DEMADEX) 20 MG tablet Take 1 tablet (20 mg total) by mouth daily. Take one tablet daily as needed 115 tablet 3   No current facility-administered medications for this encounter.     Allergies:   Entresto [sacubitril-valsartan]   Social History:  The patient  reports that he has never smoked. He has never used smokeless tobacco. He reports current alcohol use. He reports that he does not use drugs.   Family History:  The patient's family history includes CVA in his mother; Gout in his father; Hypertension in his father; Multiple sclerosis in his mother; Seizures in his mother.   ROS:  Please see the history of present illness.   All other systems are personally reviewed and negative.   Exam:  (Video/Tele Health Call; Exam is subjective and or/visual.) General:  Speaks in full sentences. No resp difficulty. Lungs: Normal respiratory effort with conversation.  Abdomen: Non-distended per patient report Extremities: Pt denies edema. Neuro: Alert & oriented x 3.   Recent Labs: 11/26/2018: BUN 15; Creatinine, Ser 1.26; Hemoglobin 12.3; Platelets 154; Potassium 4.7; Sodium 136  Personally reviewed   Wt Readings from Last 3 Encounters:  12/28/18 (!) 149.2 kg (329 lb)  12/03/18 (!) 146.6 kg (323 lb 3.2 oz)  11/26/18 (!) 147.1 kg (324 lb 3.2 oz)      ASSESSMENT AND PLAN: 1. Combined Systolic/Diastolic Heart Failure- ECHO 02/22/2017 EF 40-45% Grade II DD. Peak PA pressure 46 mmhg. Echo 9/18 EF ~20-25%. Echo 01/2018: EF 40-45%  - Echo 1/20  EF 35-40% with mild RV dysfunction - [place] NYHA II symptoms.   - Volume status ok. Weight down almost 40 pounds - Continue torsemide 20 mg every other day. Encouraged to take extra as needed  - Decrease carvedilol to 12.5 mg BID with conduction system disease - Continue spiro 25 mg daily.  - Off Bidil with hypotension. - Continue losartan 50  mg qhs. No room to increase today. Will not rechallenge with Entresto given previous severe orthostasis and nausea - Too large for cMRI.  - Has seen Dr. Jomarie LongsJoseph for Genetics Counseling. Had blood drawn 08/23/2018. He has not heard back from her.  2. Sleep Apnea - Continue CPAP. He is wearing every night.  3. Morbid Obesity: - Body mass index is 49.29 kg/m.   4. Gout - Continue allopurinol 5. PVCs  - ? If cardiomyopathy in part PVC-related. 24-hour monitor  7/18  PVCs 6% -  NSR with AV dissociation by EKG today. EP as below.  6. Atrial flutter s/p aflutter ablation 04/06/18 - Recent EKG NSR with AV dissociation and junctional rhythm at 77 bpm. Has seen Dr. Ladona Ridgelaylor. Will decrease carvedilol to 12.5 bid  COVID screen The patient does not have any symptoms that suggest any further testing/ screening at this time.  Social distancing  reinforced today.  Recommended follow-up:  3 months  Relevant cardiac medications were reviewed at length with the patient today.   The patient does not have concerns regarding their medications at this time.   The following changes were made today:  As above  Today, I have spent 18 minutes with the patient with telehealth technology discussing the above issues .    Signed, Arvilla Meres, MD  03/19/2019 10:00 AM  Advanced [facility] Failure Clinic [facility] [address] [facility] [place] [address] [phone] (office) [phone] (fax)

## 2019-03-19 NOTE — Addendum Note (Signed)
Encounter addended by: Nicole Cella, RN on: 03/19/2019 11:09 AM  Actions taken: Order list changed, Clinical Note Signed

## 2019-03-19 NOTE — Patient Instructions (Signed)
Decrease Carvedilol to 12.5mg  two times daily.  Please follow up with Dr. [name] in 4 months.

## 2019-03-22 ENCOUNTER — Ambulatory Visit (HOSPITAL_COMMUNITY): Payer: BLUE CROSS/BLUE SHIELD

## 2019-03-26 ENCOUNTER — Encounter (HOSPITAL_COMMUNITY): Payer: Self-pay | Admitting: *Deleted

## 2019-04-05 ENCOUNTER — Other Ambulatory Visit (HOSPITAL_COMMUNITY): Payer: Self-pay | Admitting: Internal Medicine

## 2019-04-10 ENCOUNTER — Other Ambulatory Visit (HOSPITAL_COMMUNITY): Payer: Self-pay | Admitting: *Deleted

## 2019-04-10 MED ORDER — MITIGARE 0.6 MG PO CAPS: 0.6000 mg | ORAL_CAPSULE | Freq: Every day | ORAL | 5 refills | Status: DC | PRN

## 2019-04-10 MED ORDER — ALLOPURINOL 100 MG PO TABS: 200.0000 mg | ORAL_TABLET | Freq: Every day | ORAL | 0 refills | Status: DC

## 2019-05-04 ENCOUNTER — Other Ambulatory Visit (HOSPITAL_COMMUNITY): Payer: Self-pay | Admitting: Internal Medicine

## 2019-05-22 ENCOUNTER — Telehealth (HOSPITAL_COMMUNITY): Payer: Self-pay

## 2019-05-22 NOTE — Telephone Encounter (Signed)
Pt called requesting refill for allopurinol. Last script sent by our office sent a note that patient had to refer to primary for future refills. Pt advised of same. Verbalized understanding.

## 2019-05-29 ENCOUNTER — Other Ambulatory Visit: Payer: Self-pay | Admitting: Internal Medicine

## 2019-05-29 DIAGNOSIS — Z20822 Contact with and (suspected) exposure to covid-19: Secondary | ICD-10-CM

## 2019-05-29 DIAGNOSIS — Z1159 Encounter for screening for other viral diseases: Secondary | ICD-10-CM | POA: Diagnosis not present

## 2019-06-01 LAB — NOVEL CORONAVIRUS, NAA: SARS-CoV-2, NAA: NOT DETECTED

## 2019-07-07 IMAGING — US ULTRASOUND ABDOMEN LIMITED
1 series · 14 of 25 positions shown · non-contrast
Comparison: None.

CLINICAL DATA: Nausea and vomiting

EXAM:
ULTRASOUND ABDOMEN LIMITED RIGHT UPPER QUADRANT

[Series 1: ultrasound abdomen limited · 0.23mm/px · 14 of 59 slices shown]
[im 1/59]
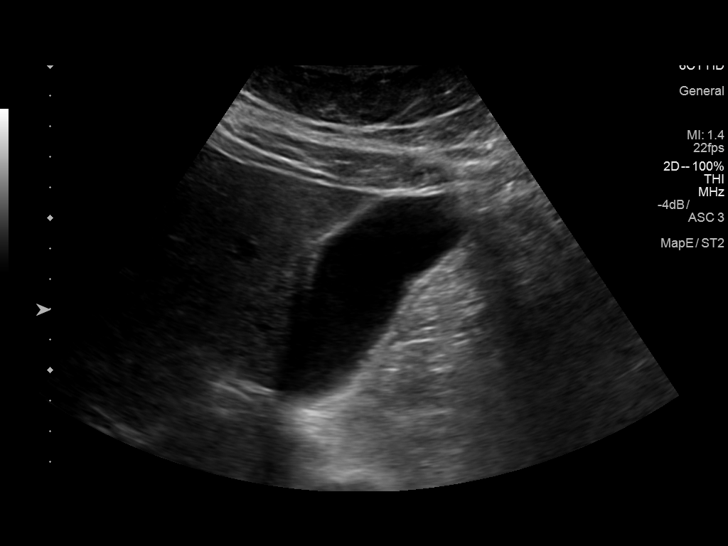
[im 5/59]
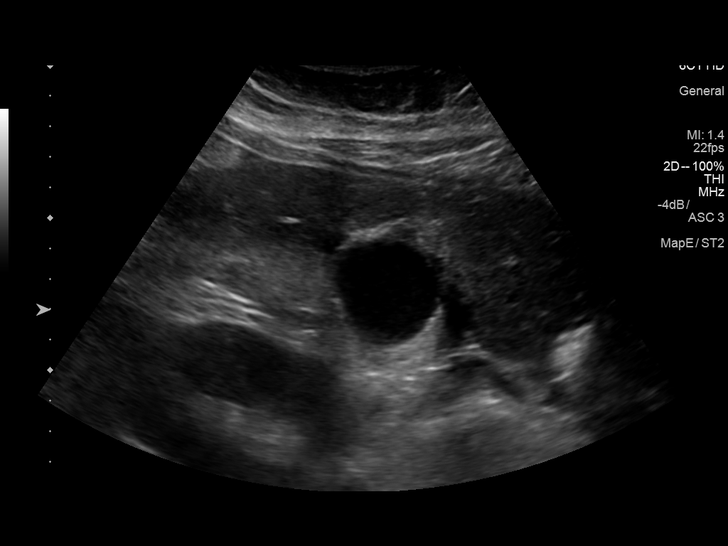
[im 10/59]
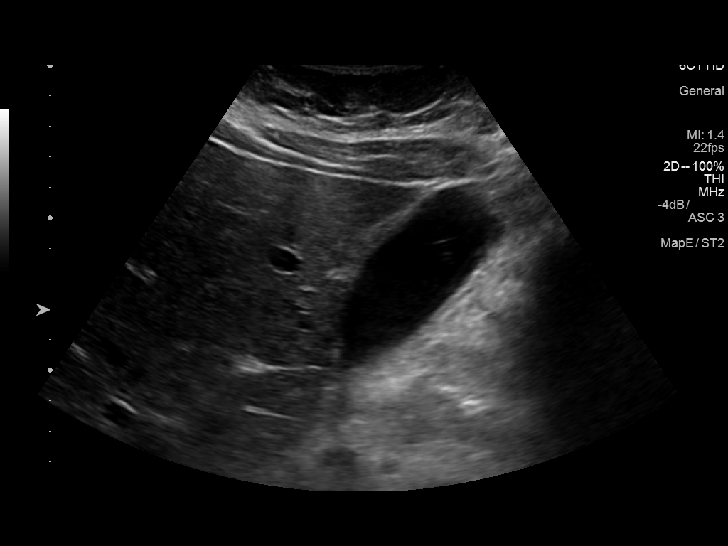
[im 15/59]
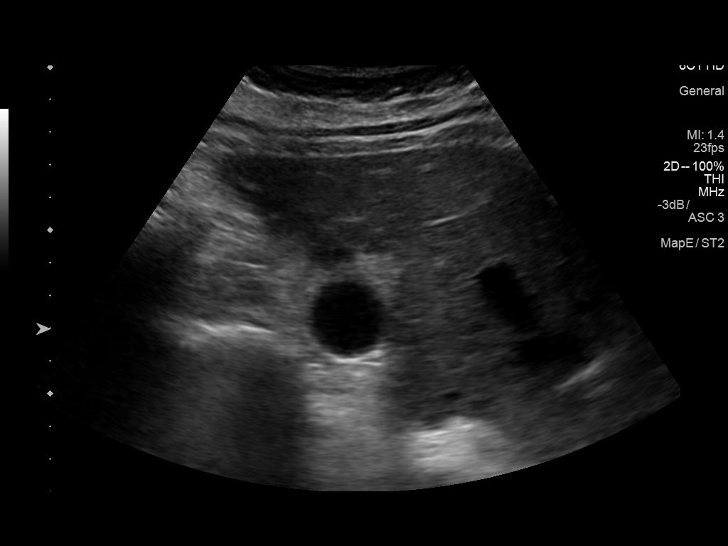
[im 20/59]
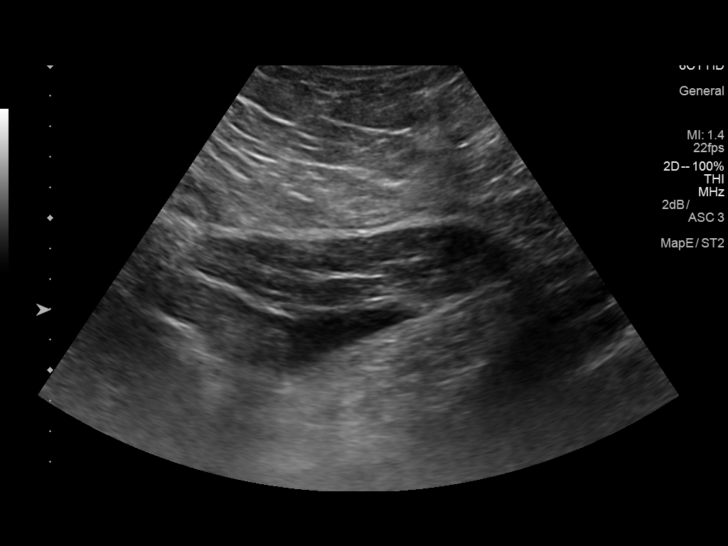
[im 22/59]
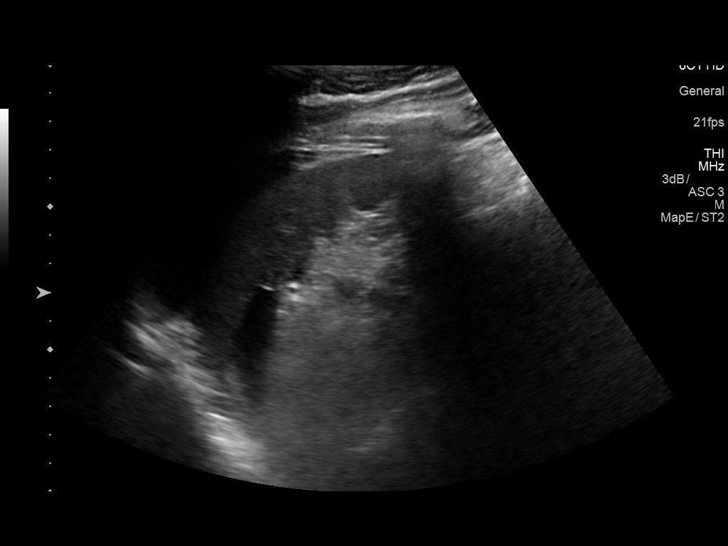
[im 27/59]
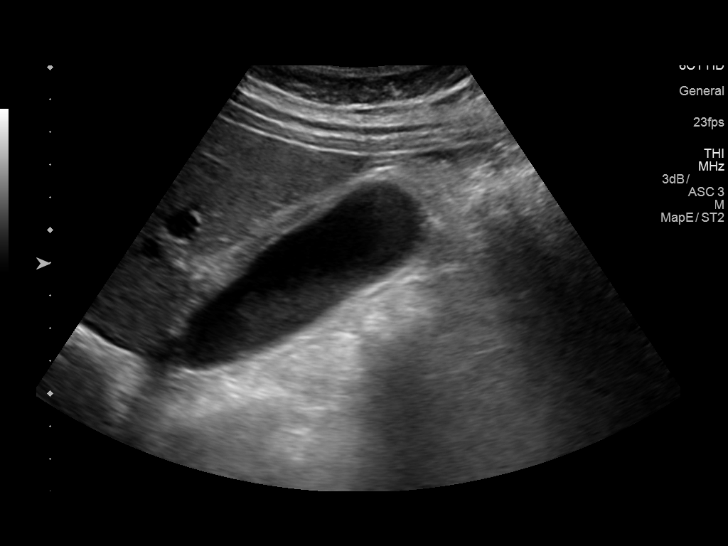
[im 32/59]
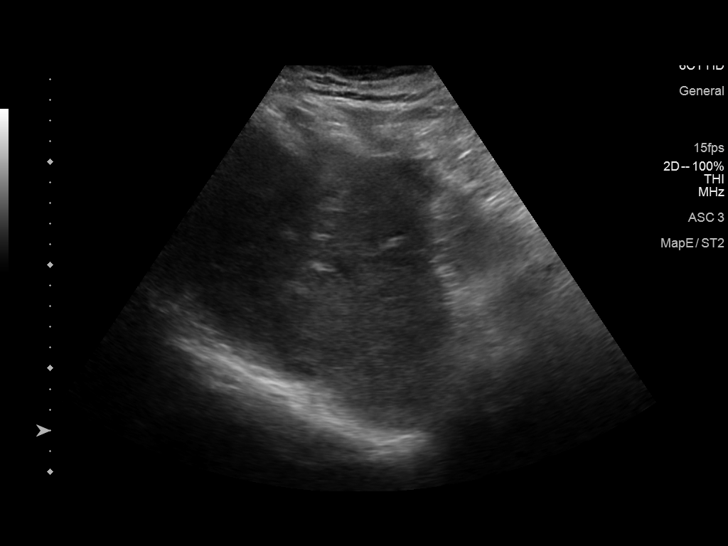
[im 37/59]
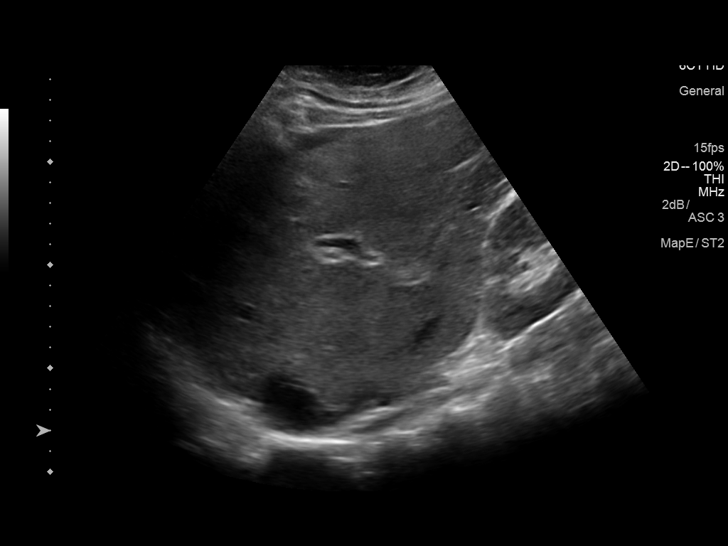
[im 39/59]
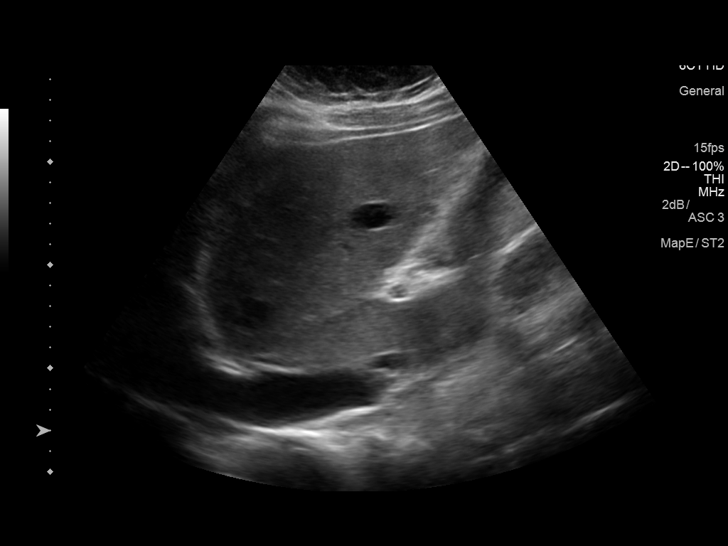
[im 44/59]
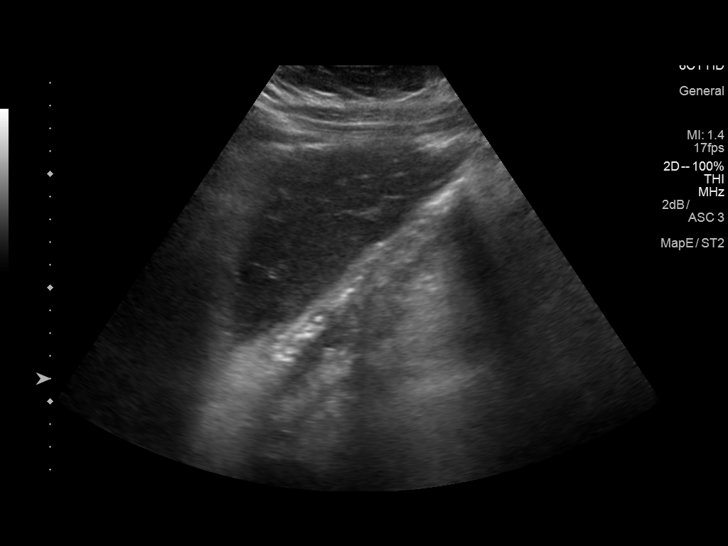
[im 49/59]
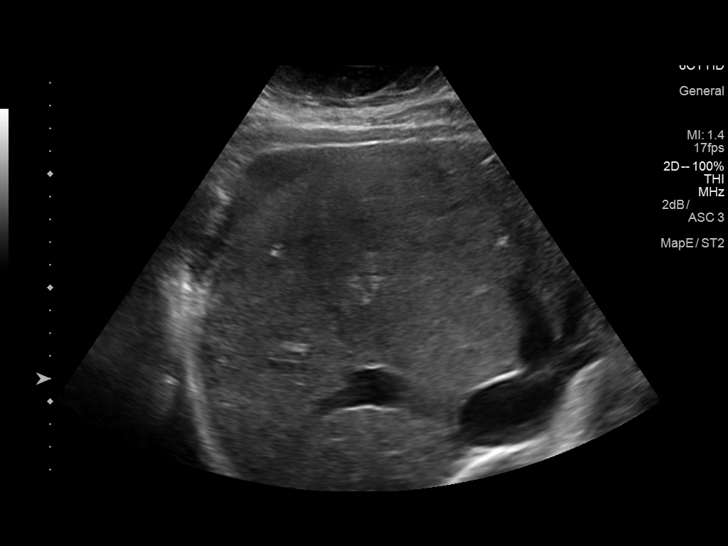
[im 54/59]
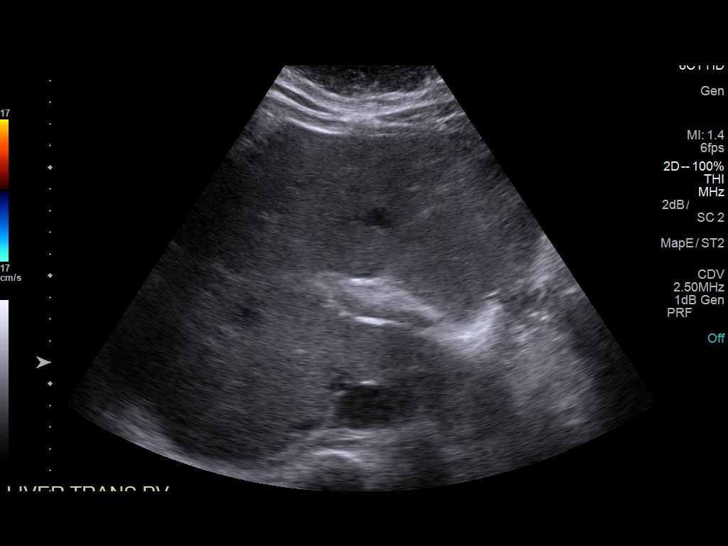
[im 59/59]
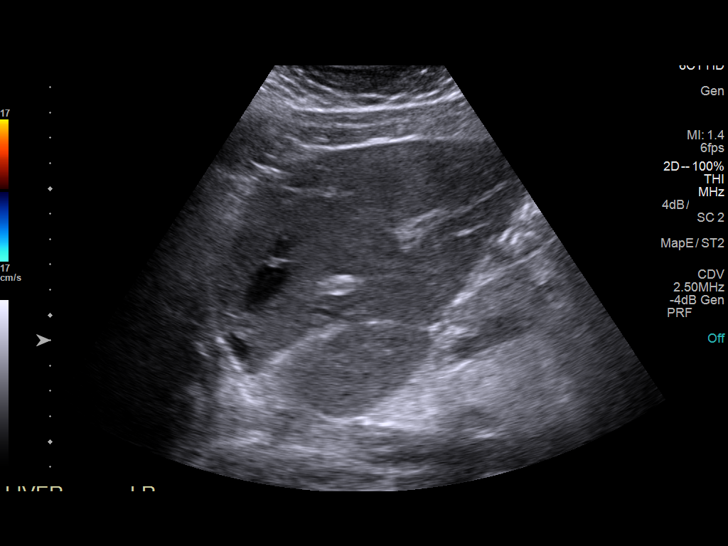

[14 of 25 positions shown; findings below may reference images not displayed]

FINDINGS: Gallbladder:

Relative gallbladder wall thickening measuring 3.2 mm likely
reactive given the presence of ascites. No cholelithiasis. 3 mm
echogenic focus along the gallbladder wall likely reflecting a small
polyp. Negative sonographic Murphy sign.

Common bile duct:

Diameter: 3.7 mm

Liver:

No focal lesion identified. Within normal limits in parenchymal
echogenicity. Portal vein is patent on color Doppler imaging with
normal direction of blood flow towards the liver. Periportal
hyperechoic areas as can be seen with hepatocellular disease. Small
volume ascites.
IMPRESSION: 1. No cholelithiasis.
2. Small volume ascites. Periportal hyperechoic areas as can be seen
with hepatocellular disease. Correlate with LFTs.

## 2019-08-11 ENCOUNTER — Other Ambulatory Visit (HOSPITAL_COMMUNITY): Payer: Self-pay | Admitting: Internal Medicine

## 2019-09-03 DIAGNOSIS — Z20828 Contact with and (suspected) exposure to other viral communicable diseases: Secondary | ICD-10-CM | POA: Diagnosis not present

## 2019-09-11 ENCOUNTER — Encounter (HOSPITAL_COMMUNITY): Payer: Self-pay | Admitting: Internal Medicine

## 2019-09-11 ENCOUNTER — Ambulatory Visit (HOSPITAL_COMMUNITY)
Admission: RE | Admit: 2019-09-11 | Discharge: 2019-09-11 | Disposition: A | Discharge status: Home / Self Care | Payer: BC Managed Care – PPO | Source: Ambulatory Visit | Attending: Internal Medicine | Admitting: Internal Medicine

## 2019-09-11 ENCOUNTER — Other Ambulatory Visit: Payer: Self-pay

## 2019-09-11 VITALS — BP 118/62 | HR 82 | Wt 287.3 lb

## 2019-09-11 DIAGNOSIS — M109 Gout, unspecified: Secondary | ICD-10-CM | POA: Insufficient documentation

## 2019-09-11 DIAGNOSIS — Z79899 Other long term (current) drug therapy: Secondary | ICD-10-CM | POA: Diagnosis not present

## 2019-09-11 DIAGNOSIS — I272 Pulmonary hypertension, unspecified: Secondary | ICD-10-CM | POA: Diagnosis not present

## 2019-09-11 DIAGNOSIS — I5042 Chronic combined systolic (congestive) and diastolic (congestive) heart failure: Secondary | ICD-10-CM | POA: Insufficient documentation

## 2019-09-11 DIAGNOSIS — Z8249 Family history of ischemic heart disease and other diseases of the circulatory system: Secondary | ICD-10-CM | POA: Diagnosis not present

## 2019-09-11 DIAGNOSIS — Z6841 Body Mass Index (BMI) 40.0 and over, adult: Secondary | ICD-10-CM | POA: Insufficient documentation

## 2019-09-11 DIAGNOSIS — I5022 Chronic systolic (congestive) heart failure: Secondary | ICD-10-CM | POA: Diagnosis not present

## 2019-09-11 DIAGNOSIS — I110 Hypertensive heart disease with heart failure: Secondary | ICD-10-CM | POA: Insufficient documentation

## 2019-09-11 DIAGNOSIS — I4892 Unspecified atrial flutter: Secondary | ICD-10-CM | POA: Diagnosis not present

## 2019-09-11 DIAGNOSIS — G4730 Sleep apnea, unspecified: Secondary | ICD-10-CM | POA: Diagnosis not present

## 2019-09-11 LAB — BASIC METABOLIC PANEL
Anion gap: 9 (ref 5–15)
BUN: 15 mg/dL (ref 6–20)
CO2: 28 mmol/L (ref 22–32)
Calcium: 8.9 mg/dL (ref 8.9–10.3)
Chloride: 104 mmol/L (ref 98–111)
Creatinine, Ser: 1.24 mg/dL (ref 0.61–1.24)
GFR calc Af Amer: >60 mL/min (ref >60)
GFR calc non Af Amer: >60 mL/min (ref >60)
Glucose, Bld: 112 mg/dL — ABNORMAL HIGH (ref 70–99)
Potassium: 4.1 mmol/L (ref 3.5–5.1)
Sodium: 141 mmol/L (ref 135–145)

## 2019-09-11 LAB — BRAIN NATRIURETIC PEPTIDE: B Natriuretic Peptide: 319.7 pg/mL — ABNORMAL HIGH (ref 0.0–100.0)

## 2019-09-11 MED ORDER — LOSARTAN POTASSIUM 100 MG PO TABS: 100.0000 mg | ORAL_TABLET | Freq: Every day | ORAL | 3 refills | Status: DC

## 2019-09-11 NOTE — Patient Instructions (Addendum)
INCREASE Losartan to 100mg , one tab daily at bedtime  Labs today We will only contact you if something comes back abnormal or we need to make some changes. Otherwise no news is good news!  Your physician recommends that you schedule a follow-up appointment in: 3-4 months with Dr Haroldine Laws and echo  Your physician has requested that you have an echocardiogram. Echocardiography is a painless test that uses sound waves to create images of your heart. It provides your doctor with information about the size and shape of your heart and how well your heart's chambers and valves are working. This procedure takes approximately one hour. There are no restrictions for this procedure.  Do the following things EVERYDAY: 1) Weigh yourself in the morning before breakfast. Write it down and keep it in a log. 2) Take your medicines as prescribed 3) Eat low salt foods-Limit salt (sodium) to 2000 mg per day.  4) Stay as active as you can everyday 5) Limit all fluids for the day to less than 2 liters  At the [facility], you and your health needs are our priority. As part of our continuing mission to provide you with exceptional heart care, we have created designated Provider Care Teams. These Care Teams include your primary Cardiologist (physician) and Advanced Practice Providers (APPs- Physician Assistants and Nurse Practitioners) who all work together to provide you with the care you need, when you need it.   You may see any of the following providers on your designated Care Team at your next follow up: Marland Kitchen Dr Glori Bickers . Dr Loralie Champagne . Darrick Grinder, NP . Lyda Jester, PA   Please be sure to bring in all your medications bottles to every appointment.

## 2019-09-11 NOTE — Addendum Note (Signed)
Encounter addended by: Kerry Dory, CMA on: 09/11/2019 2:38 PM  Actions taken: Diagnosis association updated, Charge Capture section accepted, Order list changed, Clinical Note Signed

## 2019-09-11 NOTE — Progress Notes (Signed)
Advanced Heart Failure Clinic Note   Date:  09/11/2019   ID:  Bradley Powell, DOB [birth year], MRN [id number]  Location: Home  Provider location: [facility] Type of Visit: Established patient  PCP:  [name], MD  Cardiologist:  No primary care provider on file. Primary HF:   Chief Complaint: Heart Failure follow-up   History of Present Illness:  Bradley Powell is a 38 y.o. male with a history of combined systolic/diastolic heart failure,  obesity, HTN, and gout.   Admitted 4/13 through 03/07/17 with marked volume overload. EF 40-45%. Diuresed with IV lasix tid + metolazone. Had RHC/LHC as noted below. Overall diuresed 45 pounds. Discharge weight was 281 pounds.   He underwent atrial flutter ablation on 04/06/18.  At last visit he had marked 1st AV block with progression to junctional rhythm. Saw Dr. Ladona Ridgel who wanted to follow it. Suggested cutting back carvedilol but not done yet.   He presents for routine f/u. Previously Bidil stopped due to hypotension. Says he feels good. Exercising more. Walking a few miles and doing some jogging and calisthenics. No CP or SOB. No orthopnea or PND. Minimal edema. Will take extra torsemide as needed. No low BPs. Wearing CPAP every night. Has lost 40 pounds.    Echo 1/20: EF 35-40%  Echo 9/18 EF ~25-30%.  Echo 3/19: EF 45-50%  Holter 7/18: 6% PVCs  Sleep study AHI 26/hr  R/LHC 03/01/2017  AO = 109/86 (97) LV =  101/28 RA =  29 RV = 50/22 PA = 55/24 (38) PCW = 26 Fick cardiac output/index = 6.4/2.6 Thermal CO/CI = 4.9/2.0 PVR = 2.5 WU FA sat = 98% PA sat = 69%, 71% SVC sat = 82%, 82% IVC sat = 66% Assessment: 1. Normal coronary arteries  2. Mild to moderate pulmonary HTN with normal PVR 3. Biventricular volume overload with R > L heart failure 4. Moderately decreased CO by thermodilution  5. Evidence of probable anomalous PV drainage to SVC versus intrapulmonary O2  shunt (which can be seen in obese patients) 6. No intra-cardiac shunt    Bradley Powell denies symptoms worrisome for COVID 19.   Past Medical History:  Diagnosis Date  . Atrial flutter (HCC)    a. s/p ablation 03/2018.  Marland Kitchen Chronic combined systolic and diastolic CHF (congestive heart failure) (HCC)   . History of esophagogastroduodenoscopy (EGD)   . Morbid obesity (HCC)   . NICM (nonischemic cardiomyopathy) (HCC)   . PVC's (premature ventricular contractions)   . Sleep apnea    Past Surgical History:  Procedure Laterality Date  . A-FLUTTER ABLATION N/A 04/06/2018   Procedure: A-FLUTTER ABLATION;  Surgeon: Marinus Maw, MD;  Location: [facility] INVASIVE CV LAB;  Service: Cardiovascular;  Laterality: N/A;  . NO PAST SURGERIES    . RIGHT/LEFT HEART CATH AND CORONARY ANGIOGRAPHY N/A 03/01/2017   Procedure: Right/Left Heart Cath and Coronary Angiography;  Surgeon: Dolores Patty, MD;  Location: [facility] INVASIVE CV LAB;  Service: Cardiovascular;  Laterality: N/A;     Current Outpatient Medications  Medication Sig Dispense Refill  . acetaminophen (TYLENOL) 500 MG tablet Take 1,000 mg by mouth every 6 (six) hours as needed for moderate pain or headache.     . allopurinol (ZYLOPRIM) 100 MG tablet Take 2 tablets (200 mg total) by mouth daily. Please contact PCP for further refills 60 tablet 0  . carvedilol (COREG) 12.5 MG tablet Take 1 tablet (12.5 mg total) by mouth  2 (two) times daily with a meal. 180 tablet 3  . losartan (COZAAR) 100 MG tablet Take 0.5 tablets (50 mg total) by mouth daily. 45 tablet 3  . MITIGARE 0.6 MG CAPS Take 0.6 mg by mouth daily as needed (gout flare). 30 capsule 5  . spironolactone (ALDACTONE) 25 MG tablet TAKE 1 TABLET BY MOUTH EVERYDAY AT BEDTIME 90 tablet 1  . torsemide (DEMADEX) 20 MG tablet Take 1 tablet (20 mg total) by mouth daily. Take one tablet daily as needed 115 tablet 3   No current facility-administered medications for this encounter.     Allergies:    Entresto [sacubitril-valsartan]   Social History:  The patient  reports that he has never smoked. He has never used smokeless tobacco. He reports current alcohol use. He reports that he does not use drugs.   Family History:  The patient's family history includes CVA in his mother; Gout in his father; Hypertension in his father; Multiple sclerosis in his mother; Seizures in his mother.   ROS:  Please see the history of present illness.   All other systems are personally reviewed and negative.   Vitals:   09/11/19 1354  BP: 118/62  Pulse: 82  SpO2: 98%  Weight: 130.3 kg (287 lb 4.8 oz)    Exam:   General:  Well appearing. No resp difficulty HEENT: normal Neck: supple. no JVD. Carotids 2+ bilat; no bruits. No lymphadenopathy or thryomegaly appreciated. Cor: PMI nondisplaced. Regular rate & rhythm. No rubs, gallops or murmurs. Lungs: clear Abdomen: obese soft, nontender, nondistended. No hepatosplenomegaly. No bruits or masses. Good bowel sounds. Extremities: no cyanosis, clubbing, rash, edema Neuro: alert & orientedx3, cranial nerves grossly intact. moves all 4 extremities w/o difficulty. Affect pleasant  Recent Labs: 11/26/2018: BUN 15; Creatinine, Ser 1.26; Hemoglobin 12.3; Platelets 154; Potassium 4.7; Sodium 136  Personally reviewed   Wt Readings from Last 3 Encounters:  09/11/19 130.3 kg (287 lb 4.8 oz)  12/28/18 (!) 149.2 kg (329 lb)  12/03/18 (!) 146.6 kg (323 lb 3.2 oz)      ASSESSMENT AND PLAN: 1. Combined Systolic/Diastolic Heart Failure- ECHO 02/22/2017 EF 40-45% Grade II DD. Peak PA pressure 46 mmhg. Echo 9/18 EF ~20-25%. Echo 01/2018: EF 40-45%  - Echo 1/20  EF 35-40% with mild RV dysfunction - Doing well NYHA II - Volume status looks good. Weight down almost 40 pounds - Continue torsemide 20 mg every other day. Encouraged to take extra as needed  - On carvedilol 12.5 mg BID. Previously cut back with conduction system disease - Continue spiro 25 mg daily.  - Off  Bidil with hypotension. - On losartan 50 mg qhs will try to increase to 100 qhs - Next visit consider Farxiga - Has been following with Dr. Broadus John for Genetics Counseling. Felt not to have pathogenic variant for HCM. Enrolling in the Cardiomyopathy Study through [facility].  - Labs today. - Return 3-4 months with echo 2. Sleep Apnea  - Continue CPAP. He is wearing every night.  3. Morbid Obesity: - Body mass index is 43.68 kg/m. 4. Gout - Continue allopurinol 5. PVCs  - ? If cardiomyopathy in part PVC-related. 24-hour monitor  7/18  PVCs 6% -  NSR with AV dissociation by EKG previously. EP as below.  6. Atrial flutter s/p aflutter ablation 04/06/18 - Recent EKG NSR with AV dissociation and junctional rhythm at 77 bpm. Has seen Dr. Lovena Le. Will decrease carvedilol to 12.5 bid   Signed, Glori Bickers, MD  09/11/2019 2:18  PM  Advanced Heart Failure [place] [address] [phone] (office) [phone] (fax)

## 2019-12-16 ENCOUNTER — Ambulatory Visit (HOSPITAL_COMMUNITY)
Admission: RE | Admit: 2019-12-16 | Discharge: 2019-12-16 | Disposition: A | Discharge status: Home / Self Care | Payer: 59 | Source: Ambulatory Visit | Attending: Internal Medicine | Admitting: Internal Medicine

## 2019-12-16 ENCOUNTER — Ambulatory Visit (HOSPITAL_BASED_OUTPATIENT_CLINIC_OR_DEPARTMENT_OTHER)
Admission: RE | Admit: 2019-12-16 | Discharge: 2019-12-16 | Disposition: A | Discharge status: Home / Self Care | Payer: 59 | Source: Ambulatory Visit | Attending: Internal Medicine | Admitting: Internal Medicine

## 2019-12-16 ENCOUNTER — Encounter (HOSPITAL_COMMUNITY): Payer: BC Managed Care – PPO | Admitting: Internal Medicine

## 2019-12-16 ENCOUNTER — Other Ambulatory Visit (HOSPITAL_COMMUNITY): Payer: Self-pay | Admitting: *Deleted

## 2019-12-16 ENCOUNTER — Encounter (HOSPITAL_COMMUNITY): Payer: Self-pay | Admitting: Internal Medicine

## 2019-12-16 ENCOUNTER — Other Ambulatory Visit: Payer: Self-pay

## 2019-12-16 DIAGNOSIS — M109 Gout, unspecified: Secondary | ICD-10-CM | POA: Insufficient documentation

## 2019-12-16 DIAGNOSIS — I493 Ventricular premature depolarization: Secondary | ICD-10-CM | POA: Diagnosis not present

## 2019-12-16 DIAGNOSIS — I5042 Chronic combined systolic (congestive) and diastolic (congestive) heart failure: Secondary | ICD-10-CM | POA: Insufficient documentation

## 2019-12-16 DIAGNOSIS — G4730 Sleep apnea, unspecified: Secondary | ICD-10-CM

## 2019-12-16 DIAGNOSIS — I4892 Unspecified atrial flutter: Secondary | ICD-10-CM | POA: Diagnosis not present

## 2019-12-16 DIAGNOSIS — I5022 Chronic systolic (congestive) heart failure: Secondary | ICD-10-CM

## 2019-12-16 DIAGNOSIS — I110 Hypertensive heart disease with heart failure: Secondary | ICD-10-CM | POA: Insufficient documentation

## 2019-12-16 DIAGNOSIS — Z79899 Other long term (current) drug therapy: Secondary | ICD-10-CM | POA: Diagnosis not present

## 2019-12-16 DIAGNOSIS — Z6841 Body Mass Index (BMI) 40.0 and over, adult: Secondary | ICD-10-CM | POA: Insufficient documentation

## 2019-12-16 DIAGNOSIS — I272 Pulmonary hypertension, unspecified: Secondary | ICD-10-CM | POA: Insufficient documentation

## 2019-12-16 LAB — CBC
HCT: 43.1 % (ref 39.0–52.0)
Hemoglobin: 13.7 g/dL (ref 13.0–17.0)
MCH: 29.8 pg (ref 26.0–34.0)
MCHC: 31.8 g/dL (ref 30.0–36.0)
MCV: 93.7 fL (ref 80.0–100.0)
Platelets: 188 10*3/uL (ref 150–400)
RBC: 4.6 MIL/uL (ref 4.22–5.81)
RDW: 13.2 % (ref 11.5–15.5)
WBC: 6.3 10*3/uL (ref 4.0–10.5)
nRBC: 0 % (ref 0.0–0.2)

## 2019-12-16 LAB — BRAIN NATRIURETIC PEPTIDE: B Natriuretic Peptide: 330.9 pg/mL — ABNORMAL HIGH (ref 0.0–100.0)

## 2019-12-16 LAB — ECHOCARDIOGRAM COMPLETE: Weight: 4572.8 oz

## 2019-12-16 LAB — BASIC METABOLIC PANEL
Anion gap: 9 (ref 5–15)
BUN: 14 mg/dL (ref 6–20)
CO2: 29 mmol/L (ref 22–32)
Calcium: 9.3 mg/dL (ref 8.9–10.3)
Chloride: 100 mmol/L (ref 98–111)
Creatinine, Ser: 1.29 mg/dL — ABNORMAL HIGH (ref 0.61–1.24)
GFR calc Af Amer: >60 mL/min (ref >60)
GFR calc non Af Amer: >60 mL/min (ref >60)
Glucose, Bld: 113 mg/dL — ABNORMAL HIGH (ref 70–99)
Potassium: 3.6 mmol/L (ref 3.5–5.1)
Sodium: 138 mmol/L (ref 135–145)

## 2019-12-16 LAB — PROTIME-INR
INR: 1.2 (ref 0.8–1.2)
Prothrombin Time: 15.1 seconds (ref 11.4–15.2)

## 2019-12-16 MED ORDER — FARXIGA 10 MG PO TABS: 10.0000 mg | ORAL_TABLET | Freq: Every day | ORAL | 11 refills | Status: DC

## 2019-12-16 MED ORDER — SODIUM CHLORIDE 0.9% FLUSH: 3.0000 mL | Freq: Two times a day (BID) | INTRAVENOUS | Status: DC

## 2019-12-16 NOTE — H&P (View-Only) (Signed)
   Advanced Heart Failure Clinic Note   Date:  12/16/2019   ID:  Bradley Powell, DOB 11/05/1981, MRN [id number]  Location: Home  Provider location: [facility] Type of Visit: Established patient  PCP:  Powell, Ronald, MD  Cardiologist:  No primary care provider on file. Primary HF: Bradley Powell  Chief Complaint: Heart Failure follow-up   History of Present Illness:  Bradley Powell is a 39 y.o. male with a history of combined systolic/diastolic heart failure,  obesity, HTN, and gout.   Admitted 4/13 through 03/07/17 with marked volume overload. EF 40-45%. Diuresed with IV lasix tid + metolazone. Had RHC/LHC as noted below. Overall diuresed 45 pounds. Discharge weight was 281 pounds.   He underwent atrial flutter ablation on 04/06/18.  At last visit he had marked 1st AV block with progression to junctional rhythm. Saw Dr. Taylor who wanted to follow it. Suggested cutting back carvedilol but not done yet.   He presents for routine f/u. Previously Bidil stopped due to hypotension. Feels good. Denies SOB, orthopnea or PND. No orthopnea or PND. Occasional edema. Resolves with torsemide.   Echo 1/20: EF 35-40%  Echo 9/18 EF ~25-30%.  Echo 3/19: EF 45-50%  Holter 7/18: 6% PVCs  Sleep study AHI 26/hr  R/LHC 03/01/2017  AO = 109/86 (97) LV =  101/28 RA =  29 RV = 50/22 PA = 55/24 (38) PCW = 26 Fick cardiac output/index = 6.4/2.6 Thermal CO/CI = 4.9/2.0 PVR = 2.5 WU FA sat = 98% PA sat = 69%, 71% SVC sat = 82%, 82% IVC sat = 66% Assessment: 1. Normal coronary arteries  2. Mild to moderate pulmonary HTN with normal PVR 3. Biventricular volume overload with R > L heart failure 4. Moderately decreased CO by thermodilution  5. 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 No intra-cardiac shunt   Bradley Powell denies symptoms worrisome for COVID 19.   Past  Medical History:  Diagnosis Date  . Atrial flutter (HCC)    a. s/p ablation 03/2018.  . Chronic combined systolic and diastolic CHF (congestive heart failure) (HCC)   . History of esophagogastroduodenoscopy (EGD)   . Morbid obesity (HCC)   . NICM (nonischemic cardiomyopathy) (HCC)   . PVC's (premature ventricular contractions)   . Sleep apnea    Past Surgical History:  Procedure Laterality Date  . A-FLUTTER ABLATION N/A 04/06/2018   Procedure: A-FLUTTER ABLATION;  Surgeon: Taylor, Gregg W, MD;  Location: [facility];  Service: Cardiovascular;  Laterality: N/A;  . NO PAST SURGERIES    . RIGHT/LEFT HEART CATH AND CORONARY ANGIOGRAPHY N/A 03/01/2017   Procedure: Right/Left Heart Cath and Coronary Angiography;  Surgeon: Bradley Shein R Amunique Neyra, MD;  Location: [facility];  Service: Cardiovascular;  Laterality: N/A;     Current Outpatient Medications  Medication Sig Dispense Refill  . acetaminophen (TYLENOL) 500 MG tablet Take 1,000 mg by mouth every 6 (six) hours as needed for moderate pain or headache.     . allopurinol (ZYLOPRIM) 100 MG tablet Take 2 tablets (200 mg total) by mouth daily. Please contact PCP for further refills 60 tablet 0  . carvedilol (COREG) 12.5 MG tablet Take 1 tablet (12.5 mg total) by mouth 2 (two) times daily with a meal. 180 tablet 3  . losartan (COZAAR) 100 MG tablet Take 1 tablet (100 mg total) by mouth at bedtime. 90 tablet   3  . MITIGARE 0.6 MG CAPS Take 0.6 mg by mouth daily as needed (gout flare). 30 capsule 5  . spironolactone (ALDACTONE) 25 MG tablet TAKE 1 TABLET BY MOUTH EVERYDAY AT BEDTIME 90 tablet 1  . torsemide (DEMADEX) 20 MG tablet Take 1 tablet (20 mg total) by mouth daily. Take one tablet daily as needed 115 tablet 3   No current facility-administered medications for this encounter.    Allergies:   Entresto [sacubitril-valsartan]   Social History:  The patient  reports that he has never smoked. He has never used smokeless tobacco. He  reports current alcohol use. He reports that he does not use drugs.   Family History:  The patient's family history includes CVA in his mother; Gout in his father; Hypertension in his father; Multiple sclerosis in his mother; Seizures in his mother.   ROS:  Please see the history of present illness.   All other systems are personally reviewed and negative.   Vitals:   12/16/19 1341  BP: 105/67  Pulse: 83  SpO2: 98%  Weight: 129.6 kg (285 lb 12.8 oz)    Exam:   General:  Well appearing. No resp difficulty HEENT: normal Neck: supple. no JVD. Carotids 2+ bilat; no bruits. No lymphadenopathy or thryomegaly appreciated. Cor: PMI nondisplaced. Regular rate & rhythm. No rubs, gallops or murmurs. Lungs: clear Abdomen: obese soft, nontender, nondistended. No hepatosplenomegaly. No bruits or masses. Good bowel sounds. Extremities: no cyanosis, clubbing, rash, edema Neuro: alert & orientedx3, cranial nerves grossly intact. moves all 4 extremities Powell/o difficulty. Affect pleasant   Recent Labs: 09/11/2019: B Natriuretic Peptide 319.7; BUN 15; Creatinine, Ser 1.24; Potassium 4.1; Sodium 141  Personally reviewed   Wt Readings from Last 3 Encounters:  12/16/19 129.6 kg (285 lb 12.8 oz)  09/11/19 130.3 kg (287 lb 4.8 oz)  12/28/18 (!) 149.2 kg (329 lb)      ASSESSMENT AND PLAN: 1. Combined Systolic/Diastolic Heart Failure- ECHO 02/22/2017 EF 40-45% Grade II DD. Peak PA pressure 46 mmhg. Echo 9/18 EF ~20-25%. Echo 01/2018: EF 40-45%  - Echo 1/20  EF 35-40% with mild RV dysfunction RVSP 40 mmHG - Echo today (12/16/19): EF 40-45% Moderate RV dysfunction  - Stable NYHA II  - Continue torsemide 20 mg every other day. Encouraged to take extra as needed  - On carvedilol 12.5 mg BID. Cut back with conduction system disease - Continue spiro 25 mg daily.  - Off Bidil with hypotension. - On losartan 50 mg qhs will try to increase to 100 qhs - Next visit consider Farxiga - Has been following with Dr.  Jomarie Powell for Genetics Counseling. Felt not to have pathogenic variant for HCM. Enrolling in the Cardiomyopathy Study through [facility].  - Labs today 2. Sleep Apnea  - Continue CPAP. He is wearing every night.  3. Pulmonary HTN - Mild PAH on cath 2018. Has RV strain on echo today - Will repeat RHC - Stressed need for weight loss and continued CPAP compliance 3. Morbid Obesity: - Body mass index is 43.46 kg/m.  - Has lost 40 pounds in past year 4. Gout - Continue allopurinol 5. PVCs  - ? If cardiomyopathy in part PVC-related. 24-hour monitor  7/18  PVCs 6% -  NSR with AV dissociation by EKG previously. EP as below.  6. Atrial flutter s/p a flutter ablation 04/06/18 - Recent EKG NSR with AV dissociation and junctional rhythm at 77 bpm. Has seen Dr. Ladona Ridgel. Carvedilol decreased to 12.5 bid  Signed, Arvilla Meres,  MD  12/16/2019 2:31 PM  [facility] [address] [facility] [place] [address] [phone] (office) [phone] (fax)

## 2019-12-16 NOTE — Progress Notes (Signed)
  Echocardiogram 2D Echocardiogram has been performed.  Tye Savoy 12/16/2019, 1:49 PM

## 2019-12-16 NOTE — Patient Instructions (Signed)
START Farxiga 10 mg, one tab daily    Labs today We will only contact you if something comes back abnormal or we need to make some changes. Otherwise no news is good news!  Your physician recommends that you schedule a follow-up appointment in: 4 months with Dr [name]     You are scheduled for a Cardiac Catheterization on Friday, February 5 with Dr. Arvilla Meres.  1. Please arrive at the [facility] (Main Entrance A) at [facility]: [address] [place], [place] [address] at 11:00 AM (This time is two hours before your procedure to ensure your preparation). Free valet parking service is available.   Special note: Every effort is made to have your procedure done on time. Please understand that emergencies sometimes delay scheduled procedures.  2. Diet: Do not eat solid foods after midnight.  The patient may have clear liquids until 5am upon the day of the procedure.  3. Labs: Pre procedure labs done on 12/16/2019  You will need a pre procedure COVID test    WHEN: 12/18/2019 anytime between 9am-3pm WHERE: [facility]  [address] [place] [address]  This is a drive thru testing site, you will remain in your car. Be sure to get in the line FOR PROCEDURES Once you have been swabbed you will need to remain home in quarantine until you return for your procedure.   4. Medication instructions in preparation for your procedure:   Contrast Allergy: No   Stop taking, Torsemide (Demadex) Friday, February 5,    On the morning of your procedure, take your Aspirin and any morning medicines NOT listed above.  You may use sips of water.  5. Plan for one night stay--bring personal belongings. 6. Bring a current list of your medications and current insurance cards. 7. You MUST have a responsible person to drive you home. 8. Someone MUST be with you the first 24 hours after you arrive home or your discharge will be delayed. 9. Please wear clothes that are  easy to get on and off and wear slip-on shoes.  Thank you for allowing [place] to care for you!   -- [facility]

## 2019-12-16 NOTE — Addendum Note (Signed)
Encounter addended by: Theresia Bough, CMA on: 12/16/2019 3:04 PM  Actions taken: Order list changed, Diagnosis association updated, Charge Capture section accepted, Clinical Note Signed

## 2019-12-16 NOTE — Progress Notes (Signed)
Advanced Heart Failure Clinic Note   Date:  12/16/2019   ID:  Bradley Powell, DOB 02-10-1981, MRN [id number]  Location: Home  Provider location: [facility] Type of Visit: Established patient  PCP:  [name], MD  Cardiologist:  No primary care provider on file. Primary HF: Bensimhon  Chief Complaint: Heart Failure follow-up   History of Present Illness:  Bradley Powell is a 39 y.o. male with a history of combined systolic/diastolic heart failure,  obesity, HTN, and gout.   Admitted 4/13 through 03/07/17 with marked volume overload. EF 40-45%. Diuresed with IV lasix tid + metolazone. Had RHC/LHC as noted below. Overall diuresed 45 pounds. Discharge weight was 281 pounds.   He underwent atrial flutter ablation on 04/06/18.  At last visit he had marked 1st AV block with progression to junctional rhythm. Saw Dr. Ladona Ridgel who wanted to follow it. Suggested cutting back carvedilol but not done yet.   He presents for routine f/u. Previously Bidil stopped due to hypotension. Feels good. Denies SOB, orthopnea or PND. No orthopnea or PND. Occasional edema. Resolves with torsemide.   Echo 1/20: EF 35-40%  Echo 9/18 EF ~25-30%.  Echo 3/19: EF 45-50%  Holter 7/18: 6% PVCs  Sleep study AHI 26/hr  R/LHC 03/01/2017  AO = 109/86 (97) LV =  101/28 RA =  29 RV = 50/22 PA = 55/24 (38) PCW = 26 Fick cardiac output/index = 6.4/2.6 Thermal CO/CI = 4.9/2.0 PVR = 2.5 WU FA sat = 98% PA sat = 69%, 71% SVC sat = 82%, 82% IVC sat = 66% Assessment: 1. Normal coronary arteries  2. Mild to moderate pulmonary HTN with normal PVR 3. Biventricular volume overload with R > L heart failure 4. Moderately decreased CO by thermodilution  5. 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 No intra-cardiac shunt   Bradley Powell denies symptoms worrisome for COVID 19.   Past  Medical History:  Diagnosis Date  . Atrial flutter (HCC)    a. s/p ablation 03/2018.  Marland Kitchen Chronic combined systolic and diastolic CHF (congestive heart failure) (HCC)   . History of esophagogastroduodenoscopy (EGD)   . Morbid obesity (HCC)   . NICM (nonischemic cardiomyopathy) (HCC)   . PVC's (premature ventricular contractions)   . Sleep apnea    Past Surgical History:  Procedure Laterality Date  . A-FLUTTER ABLATION N/A 04/06/2018   Procedure: A-FLUTTER ABLATION;  Surgeon: Marinus Maw, MD;  Location: [facility] INVASIVE CV LAB;  Service: Cardiovascular;  Laterality: N/A;  . NO PAST SURGERIES    . RIGHT/LEFT HEART CATH AND CORONARY ANGIOGRAPHY N/A 03/01/2017   Procedure: Right/Left Heart Cath and Coronary Angiography;  Surgeon: Dolores Patty, MD;  Location: [facility] INVASIVE CV LAB;  Service: Cardiovascular;  Laterality: N/A;     Current Outpatient Medications  Medication Sig Dispense Refill  . acetaminophen (TYLENOL) 500 MG tablet Take 1,000 mg by mouth every 6 (six) hours as needed for moderate pain or headache.     . allopurinol (ZYLOPRIM) 100 MG tablet Take 2 tablets (200 mg total) by mouth daily. Please contact PCP for further refills 60 tablet 0  . carvedilol (COREG) 12.5 MG tablet Take 1 tablet (12.5 mg total) by mouth 2 (two) times daily with a meal. 180 tablet 3  . losartan (COZAAR) 100 MG tablet Take 1 tablet (100 mg total) by mouth at bedtime. 90 tablet  3  . MITIGARE 0.6 MG CAPS Take 0.6 mg by mouth daily as needed (gout flare). 30 capsule 5  . spironolactone (ALDACTONE) 25 MG tablet TAKE 1 TABLET BY MOUTH EVERYDAY AT BEDTIME 90 tablet 1  . torsemide (DEMADEX) 20 MG tablet Take 1 tablet (20 mg total) by mouth daily. Take one tablet daily as needed 115 tablet 3   No current facility-administered medications for this encounter.    Allergies:   Entresto [sacubitril-valsartan]   Social History:  The patient  reports that he has never smoked. He has never used smokeless tobacco. He  reports current alcohol use. He reports that he does not use drugs.   Family History:  The patient's family history includes CVA in his mother; Gout in his father; Hypertension in his father; Multiple sclerosis in his mother; Seizures in his mother.   ROS:  Please see the history of present illness.   All other systems are personally reviewed and negative.   Vitals:   12/16/19 1341  BP: 105/67  Pulse: 83  SpO2: 98%  Weight: 129.6 kg (285 lb 12.8 oz)    Exam:   General:  Well appearing. No resp difficulty HEENT: normal Neck: supple. no JVD. Carotids 2+ bilat; no bruits. No lymphadenopathy or thryomegaly appreciated. Cor: PMI nondisplaced. Regular rate & rhythm. No rubs, gallops or murmurs. Lungs: clear Abdomen: obese soft, nontender, nondistended. No hepatosplenomegaly. No bruits or masses. Good bowel sounds. Extremities: no cyanosis, clubbing, rash, edema Neuro: alert & orientedx3, cranial nerves grossly intact. moves all 4 extremities w/o difficulty. Affect pleasant   Recent Labs: 09/11/2019: B Natriuretic Peptide 319.7; BUN 15; Creatinine, Ser 1.24; Potassium 4.1; Sodium 141  Personally reviewed   Wt Readings from Last 3 Encounters:  12/16/19 129.6 kg (285 lb 12.8 oz)  09/11/19 130.3 kg (287 lb 4.8 oz)  12/28/18 (!) 149.2 kg (329 lb)      ASSESSMENT AND PLAN: 1. Combined Systolic/Diastolic Heart Failure- ECHO 02/22/2017 EF 40-45% Grade II DD. Peak PA pressure 46 mmhg. Echo 9/18 EF ~20-25%. Echo 01/2018: EF 40-45%  - Echo 1/20  EF 35-40% with mild RV dysfunction RVSP 40 mmHG - Echo today (12/16/19): EF 40-45% Moderate RV dysfunction  - Stable NYHA II  - Continue torsemide 20 mg every other day. Encouraged to take extra as needed  - On carvedilol 12.5 mg BID. Cut back with conduction system disease - Continue spiro 25 mg daily.  - Off Bidil with hypotension. - On losartan 50 mg qhs will try to increase to 100 qhs - Next visit consider Farxiga - Has been following with Dr.  Jomarie Longs for Genetics Counseling. Felt not to have pathogenic variant for HCM. Enrolling in the Cardiomyopathy Study through [facility].  - Labs today 2. Sleep Apnea  - Continue CPAP. He is wearing every night.  3. Pulmonary HTN - Mild PAH on cath 2018. Has RV strain on echo today - Will repeat RHC - Stressed need for weight loss and continued CPAP compliance 3. Morbid Obesity: - Body mass index is 43.46 kg/m.  - Has lost 40 pounds in past year 4. Gout - Continue allopurinol 5. PVCs  - ? If cardiomyopathy in part PVC-related. 24-hour monitor  7/18  PVCs 6% -  NSR with AV dissociation by EKG previously. EP as below.  6. Atrial flutter s/p a flutter ablation 04/06/18 - Recent EKG NSR with AV dissociation and junctional rhythm at 77 bpm. Has seen Dr. Ladona Ridgel. Carvedilol decreased to 12.5 bid  Signed, Arvilla Meres,  MD  12/16/2019 2:31 PM  [facility] [place] [address] [facility] [place] [address] [phone] (office) [phone] (fax)

## 2019-12-17 ENCOUNTER — Telehealth (HOSPITAL_COMMUNITY): Payer: Self-pay | Admitting: *Deleted

## 2019-12-17 NOTE — Telephone Encounter (Signed)
R/l heart cath pre cert faxed to [facility] at [phone]

## 2019-12-18 ENCOUNTER — Other Ambulatory Visit (HOSPITAL_COMMUNITY)
Admission: RE | Admit: 2019-12-18 | Discharge: 2019-12-18 | Disposition: A | Discharge status: Home / Self Care | Payer: 59 | Source: Ambulatory Visit | Attending: Internal Medicine | Admitting: Internal Medicine

## 2019-12-18 ENCOUNTER — Telehealth (HOSPITAL_COMMUNITY): Payer: Self-pay | Admitting: Pharmacy Technician

## 2019-12-18 ENCOUNTER — Telehealth (HOSPITAL_COMMUNITY): Payer: Self-pay | Admitting: Pharmacist

## 2019-12-18 DIAGNOSIS — Z20822 Contact with and (suspected) exposure to covid-19: Secondary | ICD-10-CM | POA: Diagnosis not present

## 2019-12-18 DIAGNOSIS — Z01812 Encounter for preprocedural laboratory examination: Secondary | ICD-10-CM | POA: Diagnosis not present

## 2019-12-18 LAB — SARS CORONAVIRUS 2 (TAT 6-24 HRS): SARS Coronavirus 2: NEGATIVE

## 2019-12-18 MED ORDER — JARDIANCE 10 MG PO TABS: 10.0000 mg | ORAL_TABLET | Freq: Every day | ORAL | 11 refills | Status: DC

## 2019-12-18 NOTE — Telephone Encounter (Signed)
Received message from patient stating the cost of Farxiga (dapagliflozin) was $428/month. Marcelline Deist is non-formulary on his insurance plan. After discussion with Dr. [name], will change therapy to Jardiance (empagliflozin) 10 mg daily. Cost of Jardiance is $25/month. With copay card, [place] Pepper will be $0.00. Patient is aware.   Karle Plumber, PharmD, BCPS, BCCP, CPP Heart Failure Clinic Pharmacist [phone]

## 2019-12-18 NOTE — Telephone Encounter (Signed)
We are switching patient from [place] (not on formulary), to Jardiance ($25.00).  Pharmacy Billing Information  BIN: [id number]  PCN: [id number]  Group: [id number]   ID: [id number]  Pharmacy confirmed that the co-pay card took the cost to $0. They have it ready for him.Updated patient as well, who is picking up today.  Bradley Powell

## 2019-12-20 ENCOUNTER — Other Ambulatory Visit: Payer: Self-pay

## 2019-12-20 ENCOUNTER — Ambulatory Visit (HOSPITAL_COMMUNITY)
Admission: RE | Disposition: A | Discharge status: Home / Self Care | Payer: Self-pay | Source: Home / Self Care | Attending: Internal Medicine

## 2019-12-20 ENCOUNTER — Ambulatory Visit (HOSPITAL_COMMUNITY)
Admission: RE | Admit: 2019-12-20 | Discharge: 2019-12-20 | Disposition: A | Discharge status: Home / Self Care | Payer: 59 | Attending: Internal Medicine | Admitting: Internal Medicine

## 2019-12-20 DIAGNOSIS — I5042 Chronic combined systolic (congestive) and diastolic (congestive) heart failure: Secondary | ICD-10-CM | POA: Diagnosis not present

## 2019-12-20 DIAGNOSIS — Z79899 Other long term (current) drug therapy: Secondary | ICD-10-CM | POA: Insufficient documentation

## 2019-12-20 DIAGNOSIS — I110 Hypertensive heart disease with heart failure: Secondary | ICD-10-CM | POA: Diagnosis present

## 2019-12-20 DIAGNOSIS — I493 Ventricular premature depolarization: Secondary | ICD-10-CM | POA: Diagnosis not present

## 2019-12-20 DIAGNOSIS — I428 Other cardiomyopathies: Secondary | ICD-10-CM | POA: Insufficient documentation

## 2019-12-20 DIAGNOSIS — Z6841 Body Mass Index (BMI) 40.0 and over, adult: Secondary | ICD-10-CM | POA: Insufficient documentation

## 2019-12-20 DIAGNOSIS — G4730 Sleep apnea, unspecified: Secondary | ICD-10-CM | POA: Insufficient documentation

## 2019-12-20 DIAGNOSIS — M109 Gout, unspecified: Secondary | ICD-10-CM | POA: Insufficient documentation

## 2019-12-20 DIAGNOSIS — I5022 Chronic systolic (congestive) heart failure: Secondary | ICD-10-CM

## 2019-12-20 DIAGNOSIS — I272 Pulmonary hypertension, unspecified: Secondary | ICD-10-CM | POA: Diagnosis not present

## 2019-12-20 HISTORY — PX: RIGHT HEART CATH: CATH118263

## 2019-12-20 LAB — POCT I-STAT EG7
Acid-Base Excess: 2 mmol/L (ref 0.0–2.0)
Bicarbonate: 24.5 mmol/L (ref 20.0–28.0)
Bicarbonate: 24.7 mmol/L (ref 20.0–28.0)
Bicarbonate: 25.5 mmol/L (ref 20.0–28.0)
Bicarbonate: 27 mmol/L (ref 20.0–28.0)
Calcium, Ion: 1.2 mmol/L (ref 1.15–1.40)
Calcium, Ion: 1.2 mmol/L (ref 1.15–1.40)
Calcium, Ion: 1.23 mmol/L (ref 1.15–1.40)
Calcium, Ion: 1.24 mmol/L (ref 1.15–1.40)
HCT: 39 % (ref 39.0–52.0)
HCT: 39 % (ref 39.0–52.0)
HCT: 39 % (ref 39.0–52.0)
HCT: 39 % (ref 39.0–52.0)
Hemoglobin: 13.3 g/dL (ref 13.0–17.0)
Hemoglobin: 13.3 g/dL (ref 13.0–17.0)
Hemoglobin: 13.3 g/dL (ref 13.0–17.0)
Hemoglobin: 13.3 g/dL (ref 13.0–17.0)
O2 Saturation: 64 %
O2 Saturation: 67 %
O2 Saturation: 75 %
O2 Saturation: 76 %
Potassium: 3.7 mmol/L (ref 3.5–5.1)
Potassium: 3.7 mmol/L (ref 3.5–5.1)
Potassium: 3.8 mmol/L (ref 3.5–5.1)
Potassium: 3.8 mmol/L (ref 3.5–5.1)
Sodium: 141 mmol/L (ref 135–145)
Sodium: 141 mmol/L (ref 135–145)
Sodium: 142 mmol/L (ref 135–145)
Sodium: 142 mmol/L (ref 135–145)
TCO2: 26 mmol/L (ref 22–32)
TCO2: 26 mmol/L (ref 22–32)
TCO2: 27 mmol/L (ref 22–32)
TCO2: 28 mmol/L (ref 22–32)
pCO2, Ven: 39.7 mmHg — ABNORMAL LOW (ref 44.0–60.0)
pCO2, Ven: 41.2 mmHg — ABNORMAL LOW (ref 44.0–60.0)
pCO2, Ven: 42.9 mmHg — ABNORMAL LOW (ref 44.0–60.0)
pCO2, Ven: 44.7 mmHg (ref 44.0–60.0)
pH, Ven: 7.382 (ref 7.250–7.430)
pH, Ven: 7.386 (ref 7.250–7.430)
pH, Ven: 7.389 (ref 7.250–7.430)
pH, Ven: 7.398 (ref 7.250–7.430)
pO2, Ven: 34 mmHg (ref 32.0–45.0)
pO2, Ven: 35 mmHg (ref 32.0–45.0)
pO2, Ven: 40 mmHg (ref 32.0–45.0)
pO2, Ven: 41 mmHg (ref 32.0–45.0)

## 2019-12-20 SURGERY — RIGHT HEART CATH
Anesthesia: LOCAL

## 2019-12-20 MED ORDER — FENTANYL CITRATE (PF) 100 MCG/2ML IJ SOLN
INTRAMUSCULAR | Status: AC
  Filled 2019-12-20: qty 2

## 2019-12-20 MED ORDER — SODIUM CHLORIDE 0.9% FLUSH: 3.0000 mL | INTRAVENOUS | Status: DC | PRN

## 2019-12-20 MED ORDER — ONDANSETRON HCL 4 MG/2ML IJ SOLN: 4.0000 mg | Freq: Four times a day (QID) | INTRAMUSCULAR | Status: DC | PRN

## 2019-12-20 MED ORDER — HEPARIN (PORCINE) IN NACL 1000-0.9 UT/500ML-% IV SOLN
INTRAVENOUS | Status: DC | PRN
  Administered 2019-12-20: 500 mL

## 2019-12-20 MED ORDER — SODIUM CHLORIDE 0.9 % IV SOLN: INTRAVENOUS | Status: DC

## 2019-12-20 MED ORDER — SODIUM CHLORIDE 0.9% FLUSH: 3.0000 mL | Freq: Two times a day (BID) | INTRAVENOUS | Status: DC

## 2019-12-20 MED ORDER — HEPARIN (PORCINE) IN NACL 1000-0.9 UT/500ML-% IV SOLN
INTRAVENOUS | Status: AC
  Filled 2019-12-20: qty 500

## 2019-12-20 MED ORDER — ASPIRIN 81 MG PO CHEW
81.0000 mg | CHEWABLE_TABLET | ORAL | Status: AC
  Administered 2019-12-20: 12:00:00 81 mg via ORAL
  Filled 2019-12-20: qty 1

## 2019-12-20 MED ORDER — MIDAZOLAM HCL 2 MG/2ML IJ SOLN
INTRAMUSCULAR | Status: DC | PRN
  Administered 2019-12-20: 1 mg via INTRAVENOUS

## 2019-12-20 MED ORDER — MIDAZOLAM HCL 2 MG/2ML IJ SOLN
INTRAMUSCULAR | Status: AC
  Filled 2019-12-20: qty 2

## 2019-12-20 MED ORDER — HYDRALAZINE HCL 20 MG/ML IJ SOLN: 10.0000 mg | INTRAMUSCULAR | Status: DC | PRN

## 2019-12-20 MED ORDER — SODIUM CHLORIDE 0.9 % IV SOLN: 250.0000 mL | INTRAVENOUS | Status: DC | PRN

## 2019-12-20 MED ORDER — ACETAMINOPHEN 325 MG PO TABS: 650.0000 mg | ORAL_TABLET | ORAL | Status: DC | PRN

## 2019-12-20 MED ORDER — LIDOCAINE HCL (PF) 1 % IJ SOLN
INTRAMUSCULAR | Status: AC
  Filled 2019-12-20: qty 30

## 2019-12-20 MED ORDER — LABETALOL HCL 5 MG/ML IV SOLN: 10.0000 mg | INTRAVENOUS | Status: DC | PRN

## 2019-12-20 MED ORDER — FENTANYL CITRATE (PF) 100 MCG/2ML IJ SOLN
INTRAMUSCULAR | Status: DC | PRN
  Administered 2019-12-20: 25 ug via INTRAVENOUS

## 2019-12-20 SURGICAL SUPPLY — 10 items
CATH BALLN WEDGE 5F 110CM (CATHETERS) ×1 IMPLANT
KIT MICROPUNCTURE NIT STIFF (SHEATH) ×1 IMPLANT
PACK CARDIAC CATHETERIZATION (CUSTOM PROCEDURE TRAY) ×2 IMPLANT
PROTECTION STATION PRESSURIZED (MISCELLANEOUS) ×2
SHEATH GLIDE SLENDER 4/5FR (SHEATH) ×2 IMPLANT
SHEATH PROBE COVER 6X72 (BAG) ×1 IMPLANT
STATION PROTECTION PRESSURIZED (MISCELLANEOUS) IMPLANT
TRANSDUCER W/STOPCOCK (MISCELLANEOUS) ×2 IMPLANT
TUBING ART PRESS 72  MALE/FEM (TUBING) ×1
TUBING ART PRESS 72 MALE/FEM (TUBING) IMPLANT

## 2019-12-20 NOTE — Interval H&P Note (Signed)
History and Physical Interval Note:  12/20/2019 11:57 AM  Bradley Powell  has presented today for surgery, with the diagnosis of heart failure.  The various methods of treatment have been discussed with the patient and family. After consideration of risks, benefits and other options for treatment, the patient has consented to  Procedure(s): RIGHT HEART CATH (N/A) as a surgical intervention.  The patient's history has been reviewed, patient examined, no change in status, stable for surgery.  I have reviewed the patient's chart and labs.  Questions were answered to the patient's satisfaction.     Daniel Bensimhon

## 2019-12-20 NOTE — Discharge Instructions (Signed)
Venogram A venogram, or venography, is a procedure that uses an X-ray and dye (contrast) to examine how well the veins work and how blood flows through them. Contrast helps the veins show up on X-rays. A venogram may be done:  To evaluate abnormalities in the vein.  To identify clots within veins, such as deep vein thrombosis (DVT).  To map out the veins that might be needed for another procedure. Tell a health care provider about:  Any allergies you have, especially to medicines, shellfish, iodine, and contrast.  All medicines you are taking, including vitamins, herbs, eye drops, creams, and over-the-counter medicines.  Any problems you or family members have had with anesthetic medicines.  Any blood disorders you have.  Any surgeries you have had and any complications that occurred.  Any medical conditions you have.  Whether you are pregnant, may be pregnant, or are breastfeeding.  Any history of smoking or tobacco use. What are the risks? Generally, this is a safe procedure. However, problems may occur, including:  Infection.  Bleeding.  Blood clots.  Allergic reaction to medicines or contrast.  Damage to other structures or organs.  Kidney problems.  Increased risk of cancer. Being exposed to too much radiation over a lifetime can increase the risk of cancer. The risk is small. What happens before the procedure? Medicines Ask your health care provider about:  Changing or stopping your regular medicines. This is especially important if you are taking diabetes medicines or blood thinners.  Taking medicines such as aspirin and ibuprofen. These medicines can thin your blood. Do not take these medicines unless your health care provider tells you to take them.  Taking over-the-counter medicines, vitamins, herbs, and supplements. General instructions  Follow instructions from your health care provider about eating or drinking restrictions.  You may have blood tests  to check how well your kidneys and liver are working and how well your blood can clot.  Plan to have someone take you home from the hospital or clinic. What happens during the procedure?   An IV will be inserted into one of your veins.  You may be given a medicine to help you relax (sedative).  You will lie down on an X-ray table. The table may be tilted in different directions during the procedure to help the contrast move throughout your body. Safety straps will keep you secure if the table is tilted.  If veins in your arm or leg will be examined, a band may be wrapped around that arm or leg to keep the veins full of blood. This may cause your arm or leg to feel numb.  The contrast will be injected into your IV. You may have a hot, flushed feeling as it moves throughout your body. You may also have a metallic taste in your mouth. Both of these sensations will go away after the test is complete.  You may be asked to lie in different positions or place your legs or arms in different positions.  At the end of the procedure, you may be given IV fluids to help wash or flush the contrast out of your veins.  The IV will be removed, and pressure will be applied to the IV site to prevent bleeding. A bandage (dressing) may be applied to the IV site. The exact procedure may vary among health care providers and hospitals. What can I expect after the procedure?  Your blood pressure, heart rate, breathing rate, and blood oxygen level will be monitored until you   leave the hospital or clinic.  You may be given something to eat and drink.  You may have bruising or mild discomfort in the area where the IV was inserted. Follow these instructions at home: Eating and drinking   Follow instructions from your health care provider about eating or drinking restrictions.  Drink a lot of water for the first several days after the procedure, as directed by your health care provider. This helps to flush the  contrast out of your body. Activity  Rest as told by your health care provider.  Return to your normal activities as told by your health care provider. Ask your health care provider what activities are safe for you.  If you were given a sedative during your procedure, do not drive for 24 hours or until your health care provider approves. General instructions  Check your IV insertion area every day for signs of infection. Check for: ? Redness, swelling, or pain. ? Fluid or blood. ? Warmth. ? Pus or a bad smell.  Take over-the-counter and prescription medicines only as told by your health care provider.  Keep all follow-up visits as told by your health care provider. This is important. Contact a health care provider if:  Your skin becomes itchy or you develop a rash or hives.  You have a fever that does not get better with medicine.  You feel nauseous or you vomit.  You have redness, swelling, or pain around the insertion site.  You have fluid or blood coming from the insertion site.  Your insertion area feels warm to the touch.  You have pus or a bad smell coming from the insertion site. Get help right away if you:  Have shortness of breath or difficulty breathing.  Develop chest pain.  Faint.  Feel very dizzy. These symptoms may represent a serious problem that is an emergency. Do not wait to see if the symptoms will go away. Get medical help right away. Call your local emergency services (911 in the [place] Do not drive yourself to the hospital. Summary  A venogram, or venography, is a procedure that uses an X-ray and contrast dye to check how well the veins work and how blood flows through them.  An IV will be inserted into one of your veins in order to inject the contrast.  During the exam, you will lie on an X-ray table. The table may be tilted in different directions during the procedure to help the contrast move throughout your body. Safety straps will keep you  secure.  After the procedure, you will need to drink a lot of water to help wash or flush the contrast out of your body. This information is not intended to replace advice given to you by your health care provider. Make sure you discuss any questions you have with your health care provider. Document Revised: 06/08/2019 Document Reviewed: 06/08/2019 Elsevier Patient Education  2020 [employer]  

## 2019-12-24 MED FILL — Lidocaine HCl Local Preservative Free (PF) Inj 1%: INTRAMUSCULAR | Qty: 30 | Status: AC

## 2020-01-04 ENCOUNTER — Ambulatory Visit: Payer: 59 | Attending: Internal Medicine

## 2020-01-04 DIAGNOSIS — Z23 Encounter for immunization: Secondary | ICD-10-CM | POA: Insufficient documentation

## 2020-01-04 NOTE — Progress Notes (Signed)
   Covid-19 Vaccination Clinic  Name:  Bradley Powell    MRN: [id number] DOB: [birth year]  01/04/2020  [name] was observed post Covid-19 immunization for 30 minutes based on pre-vaccination screening without incidence. He was provided with Vaccine Information Sheet and instruction to access the V-Safe system.   [name] was instructed to call 911 with any severe reactions post vaccine: Marland Kitchen Difficulty breathing  . Swelling of your face and throat  . A fast heartbeat  . A bad rash all over your body  . Dizziness and weakness    Immunizations Administered    Name Date Dose VIS Date Route   Pfizer COVID-19 Vaccine 01/04/2020 11:11 AM 0.3 mL 10/25/2019 Intramuscular   Manufacturer: [employer], [employer]   Lot: [id number]   NDC: [address]-0

## 2020-01-28 ENCOUNTER — Ambulatory Visit: Payer: 59 | Attending: Internal Medicine

## 2020-01-28 DIAGNOSIS — Z23 Encounter for immunization: Secondary | ICD-10-CM

## 2020-01-28 NOTE — Progress Notes (Signed)
   Covid-19 Vaccination Clinic  Name:  KADEEN SROKA    MRN: [id number] DOB: 01-23-1981  01/28/2020  Mr. Dupras was observed post Covid-19 immunization for 30 minutes based on pre-vaccination screening without incident. He was provided with Vaccine Information Sheet and instruction to access the V-Safe system.   Mr. Favorite was instructed to call 911 with any severe reactions post vaccine: Marland Kitchen Difficulty breathing  . Swelling of face and throat  . A fast heartbeat  . A bad rash all over body  . Dizziness and weakness   Immunizations Administered    Name Date Dose VIS Date Route   Pfizer COVID-19 Vaccine 01/28/2020  4:08 PM 0.3 mL 10/25/2019 Intramuscular   Manufacturer: [employer], [employer]   Lot: [id number]   NDC: [address]-4

## 2020-02-08 ENCOUNTER — Other Ambulatory Visit (HOSPITAL_COMMUNITY): Payer: Self-pay | Admitting: Internal Medicine

## 2020-02-12 ENCOUNTER — Encounter (HOSPITAL_COMMUNITY): Payer: Self-pay

## 2020-02-28 ENCOUNTER — Other Ambulatory Visit: Payer: Self-pay | Admitting: Gastroenterology

## 2020-02-28 DIAGNOSIS — R17 Unspecified jaundice: Secondary | ICD-10-CM

## 2020-03-03 ENCOUNTER — Other Ambulatory Visit: Payer: Self-pay | Admitting: Gastroenterology

## 2020-03-03 DIAGNOSIS — R17 Unspecified jaundice: Secondary | ICD-10-CM

## 2020-03-11 ENCOUNTER — Other Ambulatory Visit (HOSPITAL_COMMUNITY): Payer: Self-pay | Admitting: Internal Medicine

## 2020-03-13 ENCOUNTER — Ambulatory Visit
Admission: RE | Admit: 2020-03-13 | Discharge: 2020-03-13 | Disposition: A | Discharge status: Home / Self Care | Payer: 59 | Source: Ambulatory Visit | Attending: Gastroenterology | Admitting: Gastroenterology

## 2020-03-13 ENCOUNTER — Other Ambulatory Visit: Payer: Self-pay

## 2020-03-13 DIAGNOSIS — R17 Unspecified jaundice: Secondary | ICD-10-CM

## 2020-03-13 IMAGING — MR MR ABDOMEN WO/W CM MRCP
19 of 20 series · 44 of 48 positions shown · IV contrast (multihance)
Comparison: None.

CLINICAL DATA: Jaundice. Nausea and vomiting for approximately 1
year.

EXAM:
MRI ABDOMEN WITHOUT AND WITH CONTRAST (INCLUDING MRCP)
TECHNIQUE: Multiplanar multisequence MR imaging of the abdomen was performed
both before and after the administration of intravenous contrast.
Heavily T2-weighted images of the biliary and pancreatic ducts were
obtained, and three-dimensional MRCP images were rendered by post
processing.
CONTRAST:  20mL MULTIHANCE GADOBENATE DIMEGLUMINE 529 MG/ML IV SOLN

[Series 4: DWI · axial · 6.0mm · 1.68mm/px · z∈[-58,+194]mm · 4 of 108 slices shown (1 of 2)]
[im 1/108]
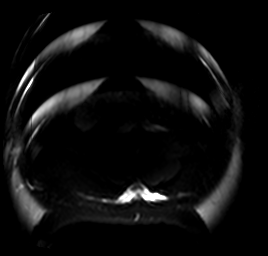
[im 36/108]
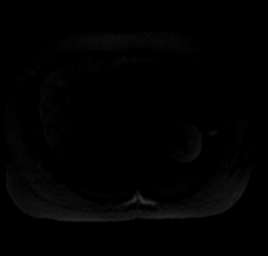
[im 72/108]
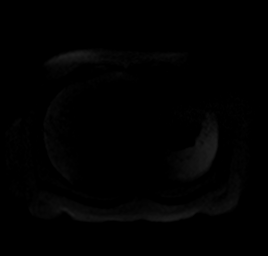
[im 108/108]
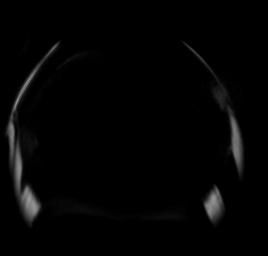

[Series 5: DWI · axial · 6.0mm · 1.68mm/px · 1 of 36 slices shown (2 of 2)]
[im 1/36]
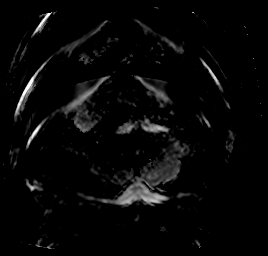

[Series 6: T2 · axial · 6.0mm · 1.76mm/px · 1 of 36 slices shown (1 of 5)]
[im 1/36]
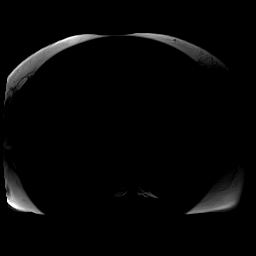

[Series 7: T2 · coronal · 5.0mm · 1.76mm/px · 1 of 36 slices shown (2 of 5)]
[im 1/36]
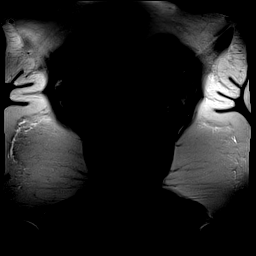

[Series 9: T2 · coronal · 5.0mm · 1.76mm/px · 1 of 36 slices shown (3 of 5)]
[im 1/36]
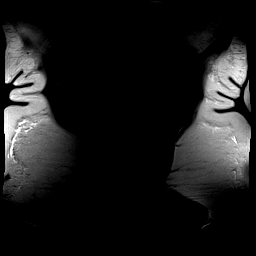

[Series 11: T2 · axial · 6.5mm · 1.41mm/px · 1 of 30 slices shown (4 of 5)]
[im 1/30]
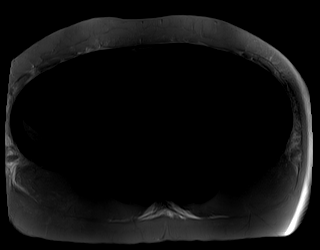

[Series 17: T1 · axial · 3.5mm · 1.41mm/px · z∈[-70,+179]mm · 5 of 144 slices shown]
[im 1/144]
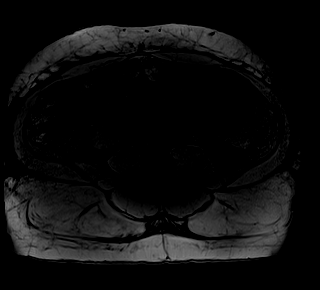
[im 36/144]
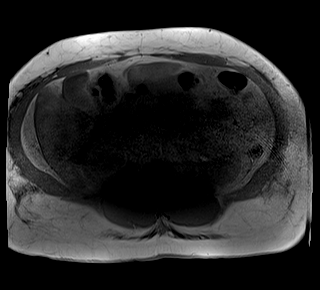
[im 72/144]
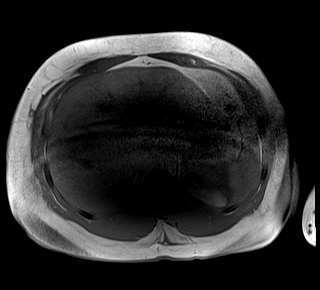
[im 108/144]
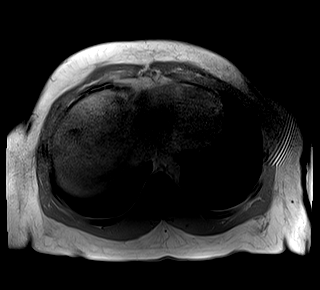
[im 144/144]
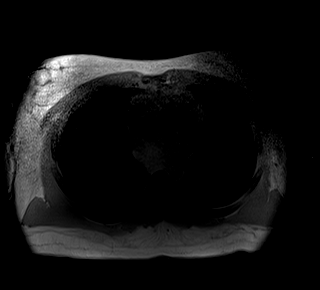

[Series 18: bSSFP · axial · 5.0mm · 1.41mm/px · 1 of 38 slices shown]
[im 1/38]
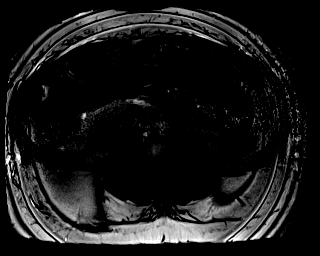

[Series 20: [id number] · coronal · 1.0mm · 0.49mm/px · 2 of 64 slices shown]
[im 1/64]
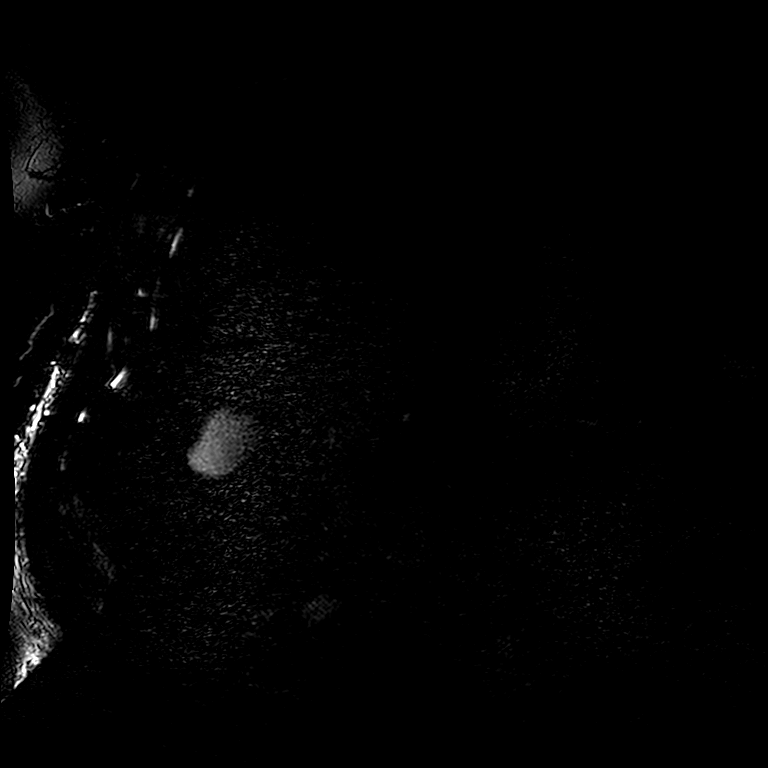
[im 64/64]
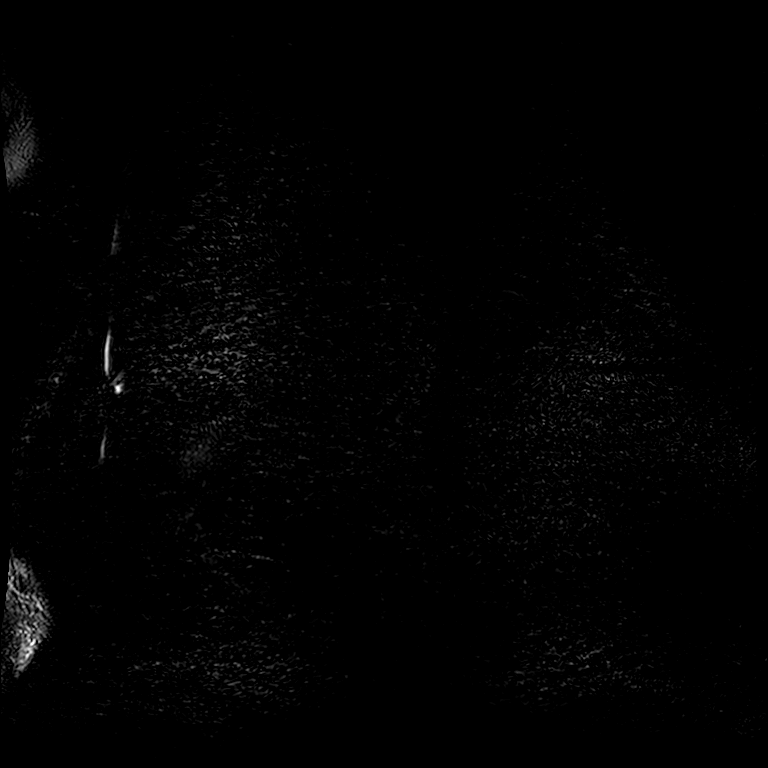

[Series 22: T2 · coronal · 3.0mm · 1.19mm/px · 1 of 19 slices shown (5 of 5)]
[im 1/19]
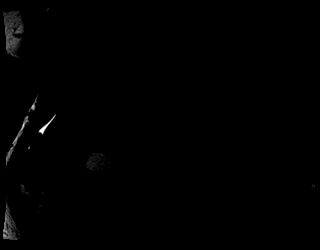

[Series 23: T1 dynamic · axial · non-contrast · 3.5mm · 1.41mm/px · z∈[-72,+177]mm · 3 of 72 slices shown]
[im 1/72]
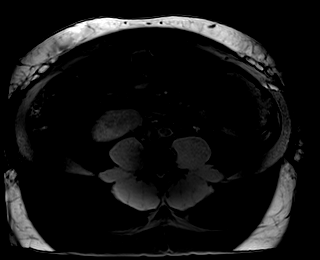
[im 36/72]
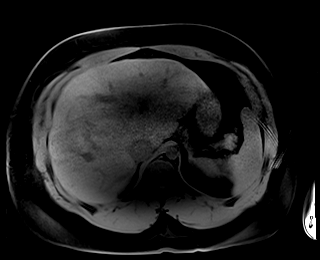
[im 72/72]
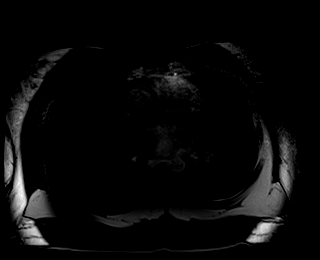

[Series 24: T1 dynamic post-contrast · axial · 3.5mm · 1.41mm/px · z∈[-72,+177]mm · 3 of 72 slices shown (1 of 8)]
[im 1/72]
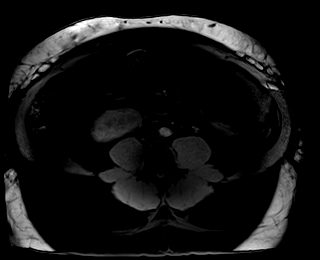
[im 36/72]
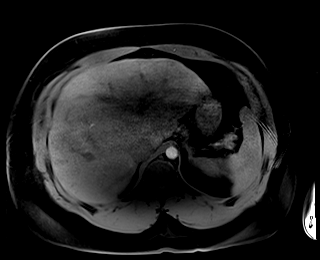
[im 72/72]
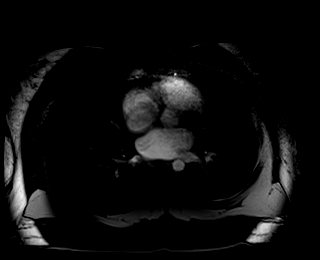

[Series 25: T1 dynamic post-contrast · axial · 3.5mm · 1.41mm/px · z∈[-72,+177]mm · 3 of 72 slices shown (2 of 8)]
[im 1/72]
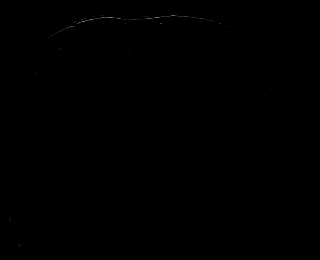
[im 36/72]
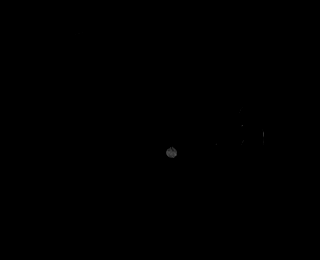
[im 72/72]
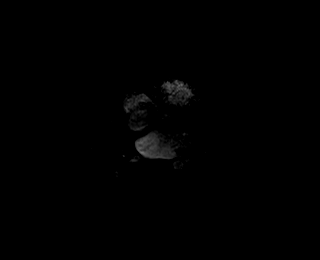

[Series 26: T1 dynamic post-contrast · axial · 3.5mm · 1.41mm/px · z∈[-72,+177]mm · 3 of 72 slices shown (3 of 8)]
[im 1/72]
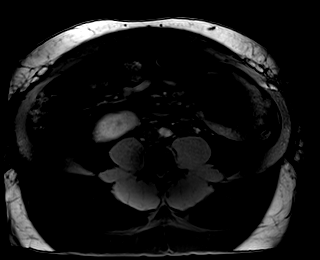
[im 36/72]
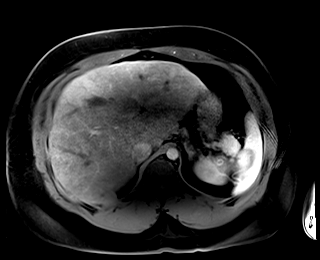
[im 72/72]
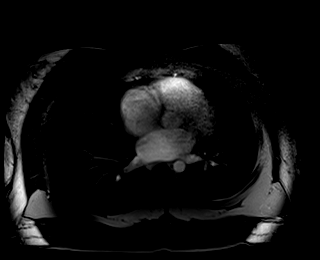

[Series 27: T1 dynamic post-contrast · axial · 3.5mm · 1.41mm/px · z∈[-72,+177]mm · 3 of 72 slices shown (4 of 8)]
[im 1/72]
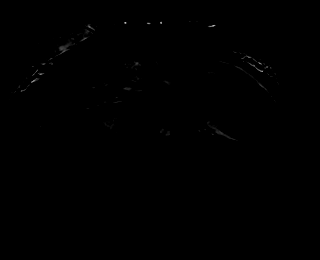
[im 36/72]
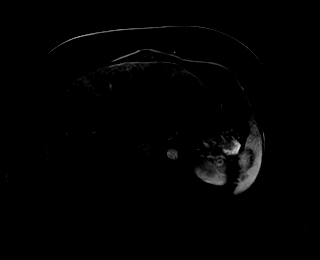
[im 72/72]
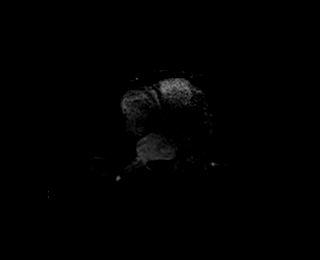

[Series 28: T1 dynamic post-contrast · axial · 3.5mm · 1.41mm/px · z∈[-72,+177]mm · 3 of 72 slices shown (5 of 8)]
[im 1/72]
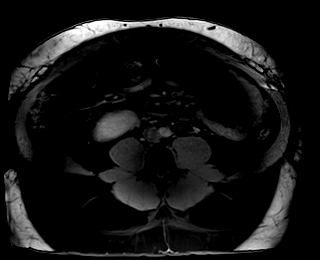
[im 36/72]
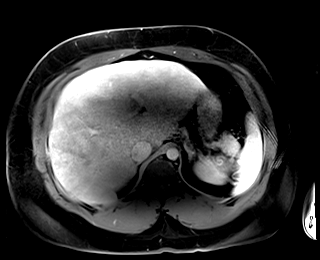
[im 72/72]
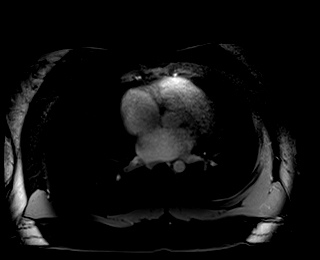

[Series 29: T1 dynamic post-contrast · axial · 3.5mm · 1.41mm/px · z∈[-72,+177]mm · 3 of 72 slices shown (6 of 8)]
[im 1/72]
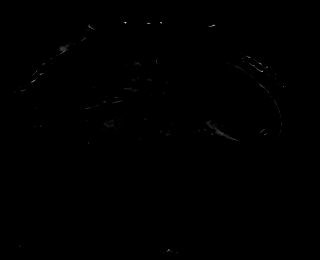
[im 36/72]
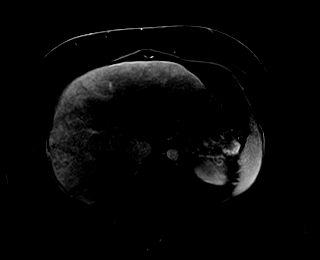
[im 72/72]
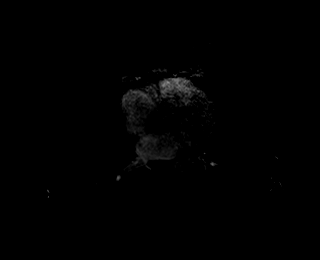

[Series 30: T1 dynamic post-contrast · coronal · 3.0mm · 1.41mm/px · 3 of 72 slices shown (7 of 8)]
[im 1/72]
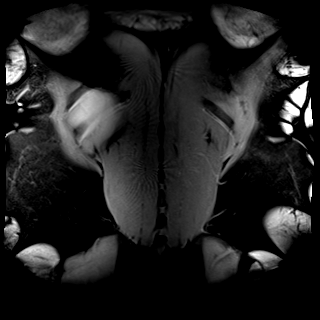
[im 36/72]
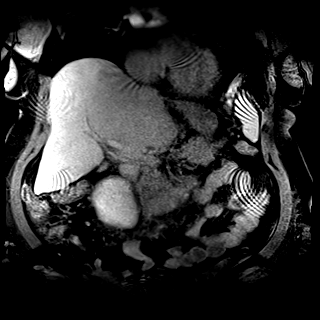
[im 72/72]
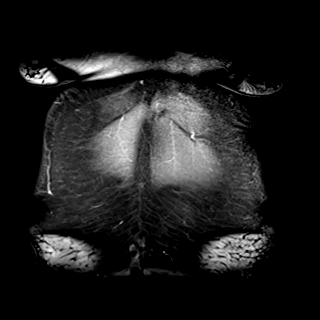

[Series 31: T1 dynamic post-contrast · axial · 3.5mm · 1.41mm/px · z∈[-72,+51]mm · 2 of 72 slices shown (8 of 8)]
[im 1/72]
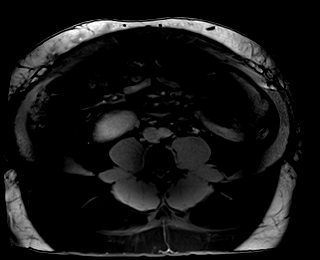
[im 36/72]
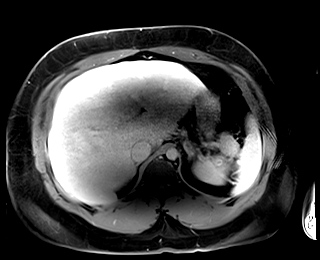

[44 of 48 positions shown; findings below may reference images not displayed]

FINDINGS: Lower chest: No acute findings.

Hepatobiliary: No hepatic masses identified. Hypertrophy of the
caudate lobe and slight capsular nodularity along left lobe are
suspicious for cirrhosis. Recanalization of paraumbilical veins is
suspicious for portal venous hypertension. Portal and hepatic veins
patent. Gallbladder is unremarkable. No evidence of biliary ductal
dilatation.

Pancreas:  No mass or inflammatory changes.

Spleen:  Within normal limits in size and appearance.

Adrenals/Urinary Tract: No masses identified. No evidence of
hydronephrosis.

Stomach/Bowel: Visualized portion unremarkable.

Vascular/Lymphatic: No pathologically enlarged lymph nodes
identified. No abdominal aortic aneurysm.

Other: No evidence of abdominal ascites. Mild diffuse body wall
edema noted.

Musculoskeletal:  No suspicious bone lesions identified.
IMPRESSION: 1. No acute findings.
2. Findings suspicious for hepatic cirrhosis. Recommend correlation
with liver function tests and hepatic serology.

## 2020-03-13 MED ORDER — GADOBENATE DIMEGLUMINE 529 MG/ML IV SOLN
20.0000 mL | Freq: Once | INTRAVENOUS | Status: AC | PRN
  Administered 2020-03-13: 20 mL via INTRAVENOUS

## 2020-04-20 ENCOUNTER — Ambulatory Visit (HOSPITAL_COMMUNITY)
Admission: RE | Admit: 2020-04-20 | Discharge: 2020-04-20 | Disposition: A | Discharge status: Home / Self Care | Payer: 59 | Source: Ambulatory Visit | Attending: Internal Medicine | Admitting: Internal Medicine

## 2020-04-20 ENCOUNTER — Encounter (HOSPITAL_COMMUNITY): Payer: Self-pay | Admitting: Internal Medicine

## 2020-04-20 ENCOUNTER — Other Ambulatory Visit: Payer: Self-pay

## 2020-04-20 VITALS — BP 98/58 | HR 70 | Wt 268.6 lb

## 2020-04-20 DIAGNOSIS — Z79899 Other long term (current) drug therapy: Secondary | ICD-10-CM | POA: Insufficient documentation

## 2020-04-20 DIAGNOSIS — I5042 Chronic combined systolic (congestive) and diastolic (congestive) heart failure: Secondary | ICD-10-CM | POA: Diagnosis present

## 2020-04-20 DIAGNOSIS — K761 Chronic passive congestion of liver: Secondary | ICD-10-CM | POA: Diagnosis not present

## 2020-04-20 DIAGNOSIS — I5022 Chronic systolic (congestive) heart failure: Secondary | ICD-10-CM | POA: Diagnosis not present

## 2020-04-20 DIAGNOSIS — K7460 Unspecified cirrhosis of liver: Secondary | ICD-10-CM | POA: Diagnosis not present

## 2020-04-20 DIAGNOSIS — G4733 Obstructive sleep apnea (adult) (pediatric): Secondary | ICD-10-CM

## 2020-04-20 DIAGNOSIS — Z6841 Body Mass Index (BMI) 40.0 and over, adult: Secondary | ICD-10-CM | POA: Diagnosis not present

## 2020-04-20 DIAGNOSIS — G4730 Sleep apnea, unspecified: Secondary | ICD-10-CM | POA: Insufficient documentation

## 2020-04-20 DIAGNOSIS — I272 Pulmonary hypertension, unspecified: Secondary | ICD-10-CM | POA: Diagnosis not present

## 2020-04-20 DIAGNOSIS — I4892 Unspecified atrial flutter: Secondary | ICD-10-CM | POA: Insufficient documentation

## 2020-04-20 DIAGNOSIS — I110 Hypertensive heart disease with heart failure: Secondary | ICD-10-CM | POA: Insufficient documentation

## 2020-04-20 DIAGNOSIS — I493 Ventricular premature depolarization: Secondary | ICD-10-CM | POA: Insufficient documentation

## 2020-04-20 DIAGNOSIS — R42 Dizziness and giddiness: Secondary | ICD-10-CM | POA: Insufficient documentation

## 2020-04-20 DIAGNOSIS — M109 Gout, unspecified: Secondary | ICD-10-CM | POA: Insufficient documentation

## 2020-04-20 DIAGNOSIS — Z7901 Long term (current) use of anticoagulants: Secondary | ICD-10-CM | POA: Insufficient documentation

## 2020-04-20 DIAGNOSIS — I4891 Unspecified atrial fibrillation: Secondary | ICD-10-CM | POA: Diagnosis not present

## 2020-04-20 DIAGNOSIS — K760 Fatty (change of) liver, not elsewhere classified: Secondary | ICD-10-CM | POA: Insufficient documentation

## 2020-04-20 DIAGNOSIS — Z8249 Family history of ischemic heart disease and other diseases of the circulatory system: Secondary | ICD-10-CM | POA: Diagnosis not present

## 2020-04-20 DIAGNOSIS — I480 Paroxysmal atrial fibrillation: Secondary | ICD-10-CM | POA: Insufficient documentation

## 2020-04-20 LAB — COMPREHENSIVE METABOLIC PANEL
ALT: 58 U/L — ABNORMAL HIGH (ref 0–44)
AST: 41 U/L (ref 15–41)
Albumin: 3.8 g/dL (ref 3.5–5.0)
Alkaline Phosphatase: 74 U/L (ref 38–126)
Anion gap: 12 (ref 5–15)
BUN: 13 mg/dL (ref 6–20)
CO2: 28 mmol/L (ref 22–32)
Calcium: 9.7 mg/dL (ref 8.9–10.3)
Chloride: 98 mmol/L (ref 98–111)
Creatinine, Ser: 1.36 mg/dL — ABNORMAL HIGH (ref 0.61–1.24)
GFR calc Af Amer: >60 mL/min (ref >60)
GFR calc non Af Amer: >60 mL/min (ref >60)
Glucose, Bld: 87 mg/dL (ref 70–99)
Potassium: 4.3 mmol/L (ref 3.5–5.1)
Sodium: 138 mmol/L (ref 135–145)
Total Bilirubin: 4.5 mg/dL — ABNORMAL HIGH (ref 0.3–1.2)
Total Protein: 7 g/dL (ref 6.5–8.1)

## 2020-04-20 LAB — CBC
HCT: 47.7 % (ref 39.0–52.0)
Hemoglobin: 15.4 g/dL (ref 13.0–17.0)
MCH: 30.1 pg (ref 26.0–34.0)
MCHC: 32.3 g/dL (ref 30.0–36.0)
MCV: 93.2 fL (ref 80.0–100.0)
Platelets: 201 10*3/uL (ref 150–400)
RBC: 5.12 MIL/uL (ref 4.22–5.81)
RDW: 12.1 % (ref 11.5–15.5)
WBC: 6.7 10*3/uL (ref 4.0–10.5)
nRBC: 0 % (ref 0.0–0.2)

## 2020-04-20 LAB — BRAIN NATRIURETIC PEPTIDE: B Natriuretic Peptide: 152.4 pg/mL — ABNORMAL HIGH (ref 0.0–100.0)

## 2020-04-20 MED ORDER — APIXABAN 5 MG PO TABS: 5.0000 mg | ORAL_TABLET | Freq: Two times a day (BID) | ORAL | 6 refills | Status: DC

## 2020-04-20 NOTE — Patient Instructions (Addendum)
Start Eliquis 5 mg Twice daily   Stop Torsemide  Labs done today, your results will be available in MyChart, we will contact you for abnormal readings.  You have been referred to Dr Ladona Ridgel  Your physician has requested that you have a cardiac MRI. Cardiac MRI uses a computer to create images of your heart as its beating, producing both still and moving pictures of your heart and major blood vessels. For further information please visit [email or web address]. Please follow the instruction sheet given to you today for more information.  Your physician has recommended that you have a sleep study. This test records several body functions during sleep, including: brain activity, eye movement, oxygen and carbon dioxide blood levels, heart rate and rhythm, breathing rate and rhythm, the flow of air through your mouth and nose, snoring, body muscle movements, and chest and belly movement.  Your physician recommends that you schedule a follow-up appointment in: 3 months  If you have any questions or concerns before your next appointment please send [place] a message through [place] or call our office at [phone].    TO LEAVE A MESSAGE FOR THE NURSE SELECT OPTION 2, PLEASE LEAVE A MESSAGE INCLUDING: . YOUR NAME . DATE OF BIRTH . CALL BACK NUMBER . REASON FOR CALL**this is important as we prioritize the call backs  YOU WILL RECEIVE A CALL BACK THE SAME DAY AS LONG AS YOU CALL BEFORE 4:00 PM  At the [facility], you and your health needs are our priority. As part of our continuing mission to provide you with exceptional heart care, we have created designated Provider Care Teams. These Care Teams include your primary Cardiologist (physician) and Advanced Practice Providers (APPs- Physician Assistants and Nurse Practitioners) who all work together to provide you with the care you need, when you need it.   You may see any of the following providers on your designated Care Team at your next  follow up: Marland Kitchen Dr Arvilla Meres . Dr Marca Ancona . [name], NP . Robbie Lis, PA . Karle Plumber, PharmD   Please be sure to bring in all your medications bottles to every appointment.

## 2020-04-20 NOTE — Progress Notes (Signed)
Height: 5'8"    Weight: 268 lb BMI: 40.84  Today's Date: 04/20/20  STOP BANG RISK ASSESSMENT S (snore) Have you been told that you snore?     YES  T (tired) Are you often tired, fatigued, or sleepy during the day?   YES  O (obstruction) Do you stop breathing, choke, or gasp during sleep? NO   P (pressure) Do you have or are you being treated for high blood pressure? YES   B (BMI) Is your body index greater than 35 kg/m? YES   A (age) Are you 37 years old or older? NO   N (neck) Do you have a neck circumference greater than 16 inches?      G (gender) Are you a male? YES   TOTAL STOP/BANG "YES" ANSWERS 5                                                                       For Office Use Only              Procedure Order Form    YES to 3+ Stop Bang questions OR two clinical symptoms -    Clinical Notes: Will consult Sleep Specialist and refer for management of therapy due to patient increased risk of Sleep Apnea. Ordering a sleep study due to the following two clinical symptoms: Excessive daytime sleepiness G47.10 / Loud snoring R06.83

## 2020-04-20 NOTE — Progress Notes (Signed)
[facility] Note   Date:  04/20/2020   ID:  EVO ADERMAN, DOB 1981-07-09, MRN [id number]  Location: Home  Provider location: [place] [facility] Type of Visit: Established patient  PCP:  Seward Carol, MD  Cardiologist:  No primary care provider on file. Primary HF: Deasiah Hagberg  Chief Complaint: Heart Failure follow-up   History of Present Illness:  Bradley Powell is a 39 y.o. male with a history of combined systolic/diastolic heart failure,  obesity, HTN, and gout.   Admitted 4/13 through 03/07/17 with marked volume overload. EF 40-45%. Diuresed with IV lasix tid + metolazone. Had RHC/LHC as noted below. Overall diuresed 45 pounds. Discharge weight was 281 pounds.   He underwent atrial flutter ablation on 04/06/18.  At last visit he had marked 1st AV block with progression to junctional rhythm. Saw Dr. Lovena Le who wanted to follow it. Suggested cutting back carvedilol.  Previously Bidil stopped due to hypotension.  Here for routine f/u. Says breathing is fine. No edema, orthopnea or PND. Gets a bit lightheaded when standing up to fast. Recently had persistent vomitting. Saw GI who ordered AB MRI which showed cirrhosis with portal HTN. Serologies drawn. Pending endoscopy. He is wearing CPAP.    Echo 2/21L EF 40-45%   Echo 1/20: EF 35-40%  Echo 9/18 EF ~25-30%.  Echo 3/19: EF 45-50%  Holter 7/18: 6% PVCs  10/18 Sleep study AHI 26/hr  RHC 2/21 RA = 14 RV = 43/15 PA = 43/21 (31) PCW = 15 Fick cardiac output/index = 5.7/2.4 PVR = 2.8 WU Ao sat = 97% PA sat = 73%, 75% SVC sat = 67%   R/LHC 03/01/2017  AO = 109/86 (97) LV =  101/28 RA =  29 RV = 50/22 PA = 55/24 (38) PCW = 26 Fick cardiac output/index = 6.4/2.6 Thermal CO/CI = 4.9/2.0 PVR = 2.5 WU FA sat = 98% PA sat = 69%, 71% SVC sat = 82%, 82% IVC sat = 66% Assessment: 1. Normal coronary arteries  2. Mild to moderate pulmonary HTN with normal PVR 3.  Biventricular volume overload with R > L heart failure 4. Moderately decreased CO by thermodilution  5. 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 No intra-cardiac shunt   Past Medical History:  Diagnosis Date  . Atrial flutter ([place])    a. s/p ablation 03/2018.  Marland Kitchen Chronic combined systolic and diastolic CHF (congestive heart failure) ([place])   . History of esophagogastroduodenoscopy (EGD)   . Morbid obesity ([place])   . NICM (nonischemic cardiomyopathy) ([place])   . PVC's (premature ventricular contractions)   . Sleep apnea    Past Surgical History:  Procedure Laterality Date  . A-FLUTTER ABLATION N/A 04/06/2018   Procedure: A-FLUTTER ABLATION;  Surgeon: Evans Lance, MD;  Location: [place] [facility];  Service: Cardiovascular;  Laterality: N/A;  . NO PAST SURGERIES    . RIGHT HEART CATH N/A 12/20/2019   Procedure: RIGHT HEART CATH;  Surgeon: Jolaine Artist, MD;  Location: [place] [facility];  Service: Cardiovascular;  Laterality: N/A;  . RIGHT/LEFT HEART CATH AND CORONARY ANGIOGRAPHY N/A 03/01/2017   Procedure: Right/Left Heart Cath and Coronary Angiography;  Surgeon: Jolaine Artist, MD;  Location: [place] [facility];  Service: Cardiovascular;  Laterality: N/A;     Current Outpatient Medications  Medication Sig Dispense Refill  . acetaminophen (TYLENOL) 500 MG tablet  Take 1,000 mg by mouth every 6 (six) hours as needed for moderate pain or headache.     . allopurinol (ZYLOPRIM) 100 MG tablet Take 2 tablets (200 mg total) by mouth daily. Please contact PCP for further refills 60 tablet 0  . carvedilol (COREG) 12.5 MG tablet TAKE 1 TABLET (12.5 MG TOTAL) BY MOUTH 2 (TWO) TIMES DAILY WITH A MEAL. 180 tablet 1  . empagliflozin (JARDIANCE) 10 MG TABS tablet Take 10 mg by mouth daily before breakfast. 30 tablet 11  . losartan (COZAAR) 100 MG tablet Take 1 tablet (100 mg total) by mouth at bedtime. 90 tablet 3    . MITIGARE 0.6 MG CAPS Take 0.6 mg by mouth daily as needed (gout flare). 30 capsule 5  . ondansetron (ZOFRAN) 4 MG tablet Take 4 mg by mouth every 8 (eight) hours as needed.    Marland Kitchen spironolactone (ALDACTONE) 25 MG tablet TAKE 1 TABLET BY MOUTH EVERYDAY AT BEDTIME 90 tablet 3  . torsemide (DEMADEX) 20 MG tablet Take 20 mg by mouth every other day.     No current facility-administered medications for this encounter.    Allergies:   Entresto [sacubitril-valsartan]   Social History:  The patient  reports that he has never smoked. He has never used smokeless tobacco. He reports current alcohol use. He reports that he does not use drugs.   Family History:  The patient's family history includes CVA in his mother; Gout in his father; Hypertension in his father; Multiple sclerosis in his mother; Seizures in his mother.   ROS:  Please see the history of present illness.   All other systems are personally reviewed and negative.   Vitals:   04/20/20 1436  BP: (!) 98/58  Pulse: 70  SpO2: 97%  Weight: 121.8 kg (268 lb 9.6 oz)    Exam:   General:  Well appearing. No resp difficulty HEENT: normal Neck: supple. no JVD. Carotids 2+ bilat; no bruits. No lymphadenopathy or thryomegaly appreciated. Cor: PMI nondisplaced. Irregular rate & rhythm. No rubs, gallops or murmurs. Lungs: clear Abdomen: obese soft, nontender, nondistended. No hepatosplenomegaly. No bruits or masses. Good bowel sounds. Extremities: no cyanosis, clubbing, rash, edema Neuro: alert & orientedx3, cranial nerves grossly intact. moves all 4 extremities w/o difficulty. Affect pleasant  ECG: AF 63 bpm Personally reviewed   Recent Labs: 12/16/2019: B Natriuretic Peptide 330.9; BUN 14; Creatinine, Ser 1.29; Platelets 188 12/20/2019: Hemoglobin 13.3; Potassium 3.7; Sodium 142  Personally reviewed   Wt Readings from Last 3 Encounters:  04/20/20 121.8 kg (268 lb 9.6 oz)  12/20/19 129.6 kg (285 lb 12.8 oz)  12/16/19 129.6 kg (285 lb  12.8 oz)      ASSESSMENT AND PLAN: 1. Combined Systolic/Diastolic Heart Failure- ECHO 02/22/2017 EF 40-45% Grade II DD. Peak PA pressure 46 mmhg. Echo 9/18 EF ~20-25%. Echo 01/2018: EF 40-45%  - Echo 1/20  EF 35-40% with mild RV dysfunction RVSP 40 mmHG - Echo (12/16/19): EF 40-45% Moderate RV dysfunction  - Stable NYHA II  - Volume status now low after addition of Jardiance and having orthostatic symptoms. Stop torsemide. Use PRN only.  - On carvedilol 12.5 mg BID. Cut back with conduction system disease - Continue spiro 25 mg daily.  - Off Bidil with hypotension. - Continue losartan 100 daily. BP and volume  too low to switch to [employer]. Consider soon  - Has been following with Dr. Jomarie Longs for [facility]. Felt not to have pathogenic variant for HCM. Enrolling in the Cardiomyopathy  Study through [employer].  - check cMRI - Labs today  2. Cirrhosis, possible cardiac in nature - new diagnosis - suspect related to chronic RV failure in setting of OSA/OHS. +/- fatty liver - recent RHC with only minimally elevated PA pressures (much lower than I would suspect given his degree of cirrhosis on MRI) - continue w/u +/- possible liver biopsy - discussed need for weight loss and use of CPAP  3. Sleep Apnea  - wearing CPAP but says machine is very old and thinks he needs a new device. Unable to do a download.  - place [place] sleep study to evaluate  4. Pulmonary HTN - RV strain on echo  - RHC in 2/21 minimal PAH with low PAPI - Stressed need for weight loss and continued CPAP compliance  5. Morbid Obesity: - Body mass index is 40.84 kg/m.  - [facility] die  6. PVCs  - ? If cardiomyopathy in part PVC-related. 24-hour monitor  7/18  PVCs 6% -  NSR with AV dissociation by EKG previously. EP as below.   6. Paroxysmal atrial fib/Atrial flutter  - s/p a flutter ablation 04/06/18 with Dr. Ladona Ridgel - Previous EKG NSR with AV dissociation and junctional rhythm at 77 bpm. Has seen  Dr. Ladona Ridgel. Carvedilol decreased to 12.5 bid - today he has new onset AF that is asymptomatic and rate controlled.  - will start Eliquis. Doubt DC-CV will be successful in long-term - F/u Dr. Ladona Ridgel  Total time spent 45 minutes. Over half that time spent discussing above.    Signed, Arvilla Meres, MD  04/20/2020 3:12 PM  [facility] [address] Heart and Vascular [place] [address] [phone] (office) [phone] (fax)

## 2020-04-27 ENCOUNTER — Ambulatory Visit: Payer: 59 | Attending: Internal Medicine

## 2020-04-27 DIAGNOSIS — Z20822 Contact with and (suspected) exposure to covid-19: Secondary | ICD-10-CM

## 2020-04-28 LAB — SARS-COV-2, NAA 2 DAY TAT

## 2020-04-28 LAB — NOVEL CORONAVIRUS, NAA: SARS-CoV-2, NAA: NOT DETECTED

## 2020-04-29 ENCOUNTER — Ambulatory Visit (INDEPENDENT_AMBULATORY_CARE_PROVIDER_SITE_OTHER): Payer: 59 | Admitting: Internal Medicine

## 2020-04-29 ENCOUNTER — Other Ambulatory Visit: Payer: Self-pay

## 2020-04-29 VITALS — BP 95/57 | HR 53 | Ht 68.0 in | Wt 294.0 lb

## 2020-04-29 DIAGNOSIS — I428 Other cardiomyopathies: Secondary | ICD-10-CM | POA: Diagnosis not present

## 2020-04-29 DIAGNOSIS — I480 Paroxysmal atrial fibrillation: Secondary | ICD-10-CM

## 2020-04-29 DIAGNOSIS — I4891 Unspecified atrial fibrillation: Secondary | ICD-10-CM | POA: Insufficient documentation

## 2020-04-29 DIAGNOSIS — I482 Chronic atrial fibrillation, unspecified: Secondary | ICD-10-CM | POA: Insufficient documentation

## 2020-04-29 NOTE — Patient Instructions (Addendum)
Medication Instructions:  Your physician recommends that you continue on your current medications as directed. Please refer to the Current Medication list given to you today.  Labwork: None ordered.  Testing/Procedures: None ordered.  Follow-Up: Your physician wants you to follow-up in: as needed with Dr. Taylor.      Any Other Special Instructions Will Be Listed Below (If Applicable).  If you need a refill on your cardiac medications before your next appointment, please call your pharmacy.   

## 2020-04-29 NOTE — Progress Notes (Signed)
HPI [name] is referred today by Dr. Reine Just for evaluation of atrial fib. He is a morbidly obese 39 yo man with chronic systolic heart failure, non-ischemic CM, atrial flutter s/p ablation, and AV/Sinus node disease. He was found to be in atrial fib with a controlled VR. He was asymptomatic. He has had a variable EF between 30-40%. He has class 2 symptoms. He denies syncope.  Allergies  Allergen Reactions   Entresto [Sacubitril-Valsartan] Nausea And Vomiting and Other (See Comments)    Lightheaded, fainting     Current Outpatient Medications  Medication Sig Dispense Refill   acetaminophen (TYLENOL) 500 MG tablet Take 1,000 mg by mouth every 6 (six) hours as needed for moderate pain or headache.      allopurinol (ZYLOPRIM) 100 MG tablet Take 2 tablets (200 mg total) by mouth daily. Please contact PCP for further refills 60 tablet 0   apixaban (ELIQUIS) 5 MG TABS tablet Take 1 tablet (5 mg total) by mouth 2 (two) times daily. 60 tablet 6   carvedilol (COREG) 12.5 MG tablet TAKE 1 TABLET (12.5 MG TOTAL) BY MOUTH 2 (TWO) TIMES DAILY WITH A MEAL. 180 tablet 1   empagliflozin (JARDIANCE) 10 MG TABS tablet Take 10 mg by mouth daily before breakfast. 30 tablet 11   losartan (COZAAR) 100 MG tablet Take 1 tablet (100 mg total) by mouth at bedtime. 90 tablet 3   MITIGARE 0.6 MG CAPS Take 0.6 mg by mouth daily as needed (gout flare). 30 capsule 5   ondansetron (ZOFRAN) 4 MG tablet Take 4 mg by mouth every 8 (eight) hours as needed.     spironolactone (ALDACTONE) 25 MG tablet TAKE 1 TABLET BY MOUTH EVERYDAY AT BEDTIME 90 tablet 3   No current facility-administered medications for this visit.     Past Medical History:  Diagnosis Date   Atrial flutter ([place])    a. s/p ablation 03/2018.   Chronic combined systolic and diastolic CHF (congestive heart failure) ([facility])    History of esophagogastroduodenoscopy (EGD)    Morbid obesity ([facility])    NICM (nonischemic cardiomyopathy) ([place])     PVC's (premature ventricular contractions)    Sleep apnea     ROS:   All systems reviewed and negative except as noted in the HPI.   Past Surgical History:  Procedure Laterality Date   A-FLUTTER ABLATION N/A 04/06/2018   Procedure: A-FLUTTER ABLATION;  Surgeon: Evans Lance, MD;  Location: [place] [facility];  Service: Cardiovascular;  Laterality: N/A;   NO PAST SURGERIES     RIGHT HEART CATH N/A 12/20/2019   Procedure: RIGHT HEART CATH;  Surgeon: Jolaine Artist, MD;  Location: [place] [facility];  Service: Cardiovascular;  Laterality: N/A;   RIGHT/LEFT HEART CATH AND CORONARY ANGIOGRAPHY N/A 03/01/2017   Procedure: Right/Left Heart Cath and Coronary Angiography;  Surgeon: Jolaine Artist, MD;  Location: [place] [facility];  Service: Cardiovascular;  Laterality: N/A;     Family History  Problem Relation Age of Onset   CVA Mother        pacemaker   Multiple sclerosis Mother    Seizures Mother    Hypertension Father    Gout Father      Social History   Socioeconomic History   Marital status: Single    Spouse name: Not on file   Number of children: Not on file   Years of education: Not on file   Highest education level: Not on file  Occupational History   Not on file  Tobacco Use   Smoking status: Never Smoker   Smokeless tobacco: Never Used  Vaping Use   Vaping Use: Never used  Substance and Sexual Activity   Alcohol use: Yes    Comment: rarely   Drug use: No   Sexual activity: Not on file  Other Topics Concern   Not on file  Social History Narrative   Not on file   Social Determinants of Health   Financial Resource Strain:    Difficulty of Paying Living Expenses:   Food Insecurity:    Worried About Programme researcher, broadcasting/film/video in the Last Year:    Barista in the Last Year:   Transportation Needs:    Freight forwarder (Medical):    Lack of Transportation (Non-Medical):   Physical Activity:    Days of  Exercise per Week:    Minutes of Exercise per Session:   Stress:    Feeling of Stress :   Social Connections:    Frequency of Communication with Friends and Family:    Frequency of Social Gatherings with Friends and Family:    Attends Religious Services:    Active Member of Clubs or Organizations:    Attends Engineer, structural:    Marital Status:   Intimate Partner Violence:    Fear of Current or Ex-Partner:    Emotionally Abused:    Physically Abused:    Sexually Abused:      BP (!) 95/57    Pulse (!) 53    Ht 5\' 8"  (1.727 m)    Wt 294 lb (133.4 kg)    BMI 44.70 kg/m   Physical Exam:  Morbidly appearing 39 yo man, NAD HEENT: Unremarkable Neck:  7 cm JVD, no thyromegally Lymphatics:  No adenopathy Back:  No CVA tenderness Lungs:  Clear with no wheezes HEART:  Regular rate rhythm, no murmurs, no rubs, no clicks Abd:  soft, positive bowel sounds, no organomegally, no rebound, no guarding Ext:  2 plus pulses, no edema, no cyanosis, no clubbing Skin:  No rashes no nodules Neuro:  CN II through XII intact, motor grossly intact  EKG - atrial fib with a controlled VR  DEVICE  Normal device function.  See PaceArt for  Assess/Plan: 1. Atrial fib - his VR is well controlled. He is asymptomatic. I discussed treatment options with the patient and Dr. 31. We could consider initiation of dofetilide. He is very unlikely to maintain NSR with DCCV.  2. Sinus node dysfunction - unfortunately he is very slow in rhythm. He would very likely need either a PM or ICD.  3. Obesity - he desperately needs to lose weight.  4. Chronic systolic heart failure - I think it will be important to recheck his LV function in atrial fib. If he were to develop worsening LV function in atrial fib then I would suggest ICD insertion followed by dofetilide.  [place].D.

## 2020-04-30 ENCOUNTER — Other Ambulatory Visit: Payer: Self-pay | Admitting: Gastroenterology

## 2020-05-13 ENCOUNTER — Other Ambulatory Visit (HOSPITAL_COMMUNITY): Payer: Self-pay | Admitting: Internal Medicine

## 2020-06-03 ENCOUNTER — Other Ambulatory Visit (HOSPITAL_COMMUNITY): Payer: Self-pay | Admitting: Internal Medicine

## 2020-06-03 ENCOUNTER — Telehealth (HOSPITAL_COMMUNITY): Payer: Self-pay | Admitting: *Deleted

## 2020-06-03 NOTE — Telephone Encounter (Signed)
Reaching out to patient to offer assistance regarding upcoming cardiac imaging study; pt verbalizes understanding of appt date/time, parking situation and where to check in; name and call back number provided for further questions should they arise  [name] Tai RN Navigator Cardiac Imaging [facility] [phone] office [phone] cell  

## 2020-06-04 ENCOUNTER — Other Ambulatory Visit: Payer: Self-pay

## 2020-06-04 ENCOUNTER — Ambulatory Visit (HOSPITAL_COMMUNITY)
Admission: RE | Admit: 2020-06-04 | Discharge: 2020-06-04 | Disposition: A | Discharge status: Home / Self Care | Payer: 59 | Source: Ambulatory Visit | Attending: Internal Medicine | Admitting: Internal Medicine

## 2020-06-04 DIAGNOSIS — I5022 Chronic systolic (congestive) heart failure: Secondary | ICD-10-CM | POA: Diagnosis not present

## 2020-06-04 IMAGING — MR MR CARD MORPHOLOGY WO/W CM
45 of 48 series · 45 of 48 positions shown · IV contrast (Contrast agent)
Comparison: none

CLINICAL DATA: Cardiomyopathy, follow up r/o infiltrative disease

EXAM:
CARDIAC MRI
TECHNIQUE: The patient was scanned on a 1.5 Tesla GE magnet. A dedicated
cardiac coil was used. Functional imaging was done using [employer]
sequences. [DATE], and 4 chamber views were done to assess for RWMA's.
Modified SASAKI rule using a short axis stack was used to
calculate an ejection fraction on a dedicated work station using
Circ[employer] software. The patient received 10mL GADAVIST GADOBUTROL 1
MMOL/ML IV SOLN. After 10 minutes inversion recovery sequences were
used to assess for infiltration and scar tissue.

[Series 4: t2_haste_db_tra_bh · axial · 8.0mm · 1.56mm/px · 1 of 16 slices shown]
[im 1/16]
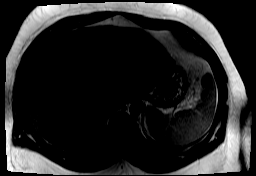

[Series 8: bSSFP · oblique · 8.0mm · 1.79mm/px · 1 of 25 slices shown (1 of 27)]
[im 1/25]
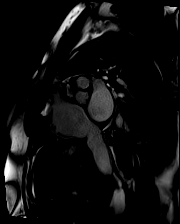

[Series 9: bSSFP · oblique · 8.0mm · 1.79mm/px · 1 of 25 slices shown (2 of 27)]
[im 1/25]
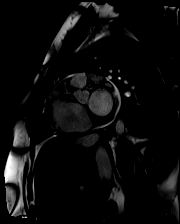

[Series 10: bSSFP · oblique · 8.0mm · 1.79mm/px · 1 of 25 slices shown (3 of 27)]
[im 1/25]
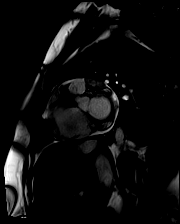

[Series 11: bSSFP · oblique · 8.0mm · 1.79mm/px · 1 of 25 slices shown (4 of 27)]
[im 1/25]
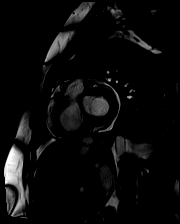

[Series 12: bSSFP · oblique · 8.0mm · 1.79mm/px · 1 of 25 slices shown (5 of 27)]
[im 1/25]
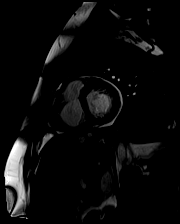

[Series 13: bSSFP · oblique · 8.0mm · 1.79mm/px · 1 of 25 slices shown (6 of 27)]
[im 1/25]
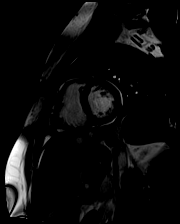

[Series 14: bSSFP · oblique · 8.0mm · 1.79mm/px · 1 of 25 slices shown (7 of 27)]
[im 1/25]
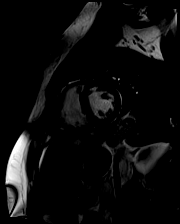

[Series 15: bSSFP · oblique · 8.0mm · 1.79mm/px · 1 of 25 slices shown (8 of 27)]
[im 1/25]
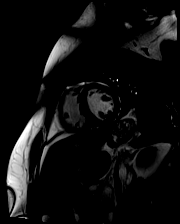

[Series 16: bSSFP · oblique · 8.0mm · 1.79mm/px · 1 of 25 slices shown (9 of 27)]
[im 1/25]
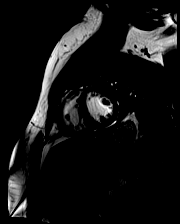

[Series 17: bSSFP · oblique · 8.0mm · 1.79mm/px · 1 of 25 slices shown (10 of 27)]
[im 1/25]
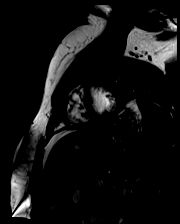

[Series 18: bSSFP · oblique · 8.0mm · 1.79mm/px · 1 of 25 slices shown (11 of 27)]
[im 1/25]
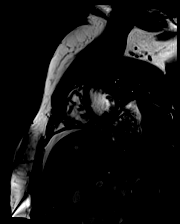

[Series 19: bSSFP · oblique · 8.0mm · 1.79mm/px · 1 of 25 slices shown (12 of 27)]
[im 1/25]
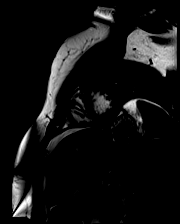

[Series 20: bSSFP · oblique · 8.0mm · 1.79mm/px · 1 of 25 slices shown (13 of 27)]
[im 1/25]
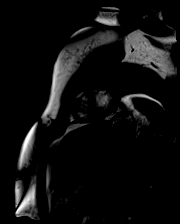

[Series 21: bSSFP · oblique · 8.0mm · 1.79mm/px · 1 of 25 slices shown (14 of 27)]
[im 1/25]
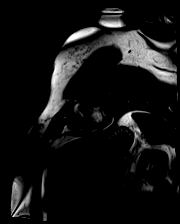

[Series 22: bSSFP · oblique · 8.0mm · 1.79mm/px · 1 of 25 slices shown (15 of 27)]
[im 1/25]
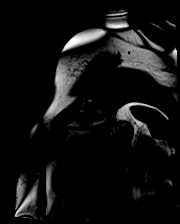

[Series 23: bSSFP · oblique · 8.0mm · 1.79mm/px · 1 of 25 slices shown (16 of 27)]
[im 1/25]
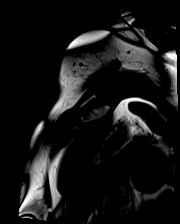

[Series 24: bSSFP · oblique · 8.0mm · 1.79mm/px · 1 of 25 slices shown (17 of 27)]
[im 1/25]
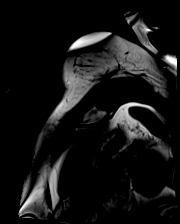

[Series 25: bSSFP · oblique · 8.0mm · 1.79mm/px · 1 of 25 slices shown (18 of 27)]
[im 1/25]
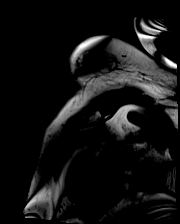

[Series 26: bSSFP · oblique · 6.0mm · 1.41mm/px · 1 of 25 slices shown (19 of 27)]
[im 1/25]
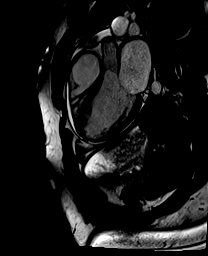

[Series 27: bSSFP · oblique · 6.0mm · 1.41mm/px · 1 of 25 slices shown (20 of 27)]
[im 1/25]
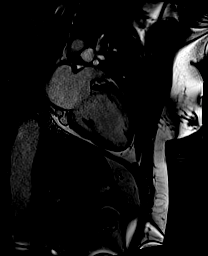

[Series 28: bSSFP · axial · 6.0mm · 1.41mm/px · 1 of 25 slices shown (21 of 27)]
[im 1/25]
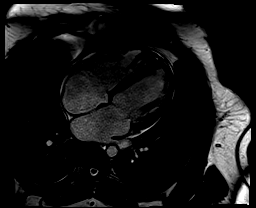

[Series 29: (id)_long_t1 · oblique · 8.0mm · 1.56mm/px · 1 of 24 slices shown]
[im 1/24]
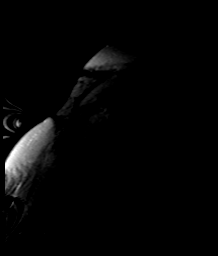

[Series 30: (id)_long_t1_moco · oblique · 8.0mm · 1.56mm/px · 1 of 24 slices shown]
[im 1/24]
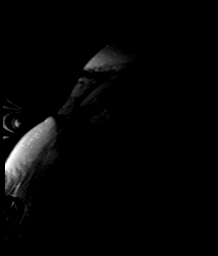

[Series 31: (id)_long_t1_moco_t1 · oblique · 8.0mm · 1.56mm/px · 1 of 6 slices shown]
[im 1/6]
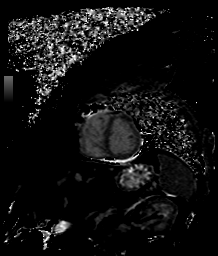

[Series 33: bSSFP · oblique · 6.0mm · 1.41mm/px · 1 of 25 slices shown (22 of 27)]
[im 1/25]
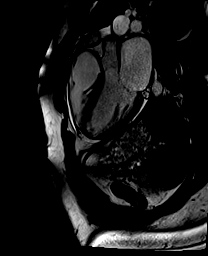

[Series 34: (id)_trufi · oblique · 8.0mm · 2.08mm/px · 1 of 9 slices shown]
[im 1/9]
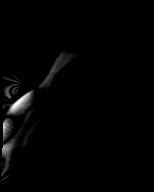

[Series 35: (id)_trufi_moco · oblique · 8.0mm · 2.08mm/px · 1 of 9 slices shown]
[im 1/9]
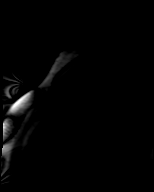

[Series 36: (id)_trufi_moco_t2 · oblique · 8.0mm · 2.08mm/px · 1 of 4 slices shown]
[im 1/4]
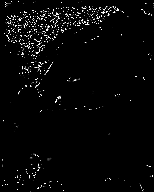

[Series 38: STIR · oblique · 8.0mm · 1.92mm/px · 1 of 17 slices shown]
[im 1/17]
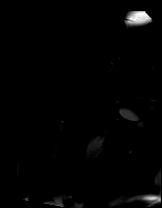

[Series 39: bSSFP · coronal · 6.0mm · 1.41mm/px · 1 of 25 slices shown (23 of 27)]
[im 1/25]
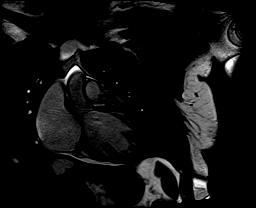

[Series 40: aortic valve cine · oblique · 6.0mm · 1.41mm/px · 1 of 25 slices shown]
[im 1/25]
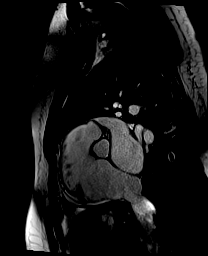

[Series 41: cine rvit · coronal · 6.0mm · 1.41mm/px · 1 of 25 slices shown]
[im 1/25]
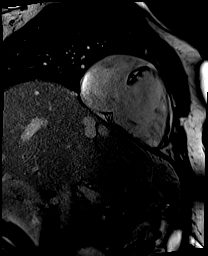

[Series 42: cine rvot · sagittal · 6.0mm · 1.41mm/px · 1 of 25 slices shown]
[im 1/25]
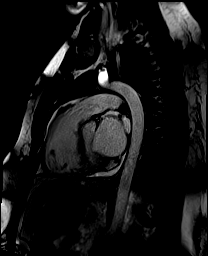

[Series 44: lge_single shot sa · oblique · 8.0mm · 2.08mm/px · 1 of 10 slices shown (1 of 2)]
[im 1/10]
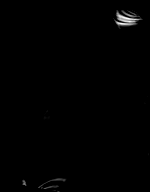

[Series 45: lge_single shot sa · oblique · 8.0mm · 2.08mm/px · 1 of 10 slices shown (2 of 2)]
[im 1/10]
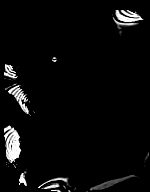

[Series 49: lge_single shot 4 · axial · 6.0mm · 1.98mm/px · 1 of 1 slices shown (1 of 2)]
[im 1/1]
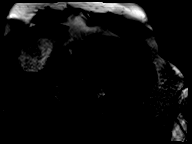

[Series 50: lge_single shot 4 · axial · 6.0mm · 1.98mm/px · 1 of 1 slices shown (2 of 2)]
[im 1/1]
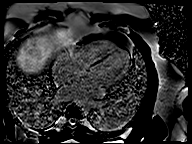

[Series 55: (id)_short_t1 · oblique · 8.0mm · 1.56mm/px · 1 of 27 slices shown]
[im 1/27]
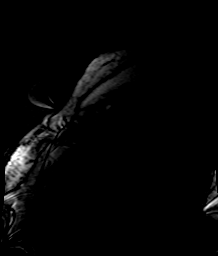

[Series 56: (id)_short_t1_moco · oblique · 8.0mm · 1.56mm/px · 1 of 27 slices shown]
[im 1/27]
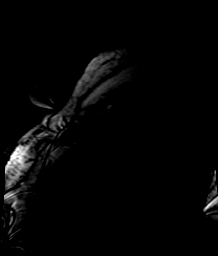

[Series 57: (id)_short_t1_moco_t1 · oblique · 8.0mm · 1.56mm/px · 1 of 6 slices shown]
[im 1/6]
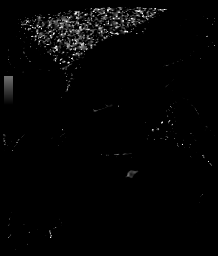

[Series 60: bSSFP · oblique · 6.0mm · 1.73mm/px · 1 of 17 slices shown (24 of 27)]
[im 1/17]
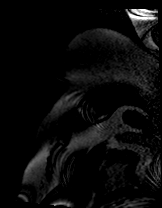

[Series 61: bSSFP · oblique · 6.0mm · 1.73mm/px · 1 of 17 slices shown (25 of 27)]
[im 1/17]
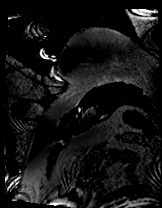

[Series 62: bSSFP · oblique · 6.0mm · 2.02mm/px · 1 of 17 slices shown (26 of 27)]
[im 1/17]
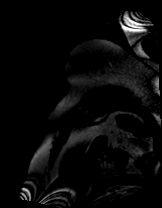

[Series 63: bSSFP · oblique · 6.0mm · 2.02mm/px · 1 of 17 slices shown (27 of 27)]
[im 1/17]
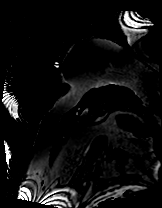

[45 of 48 positions shown; findings below may reference images not displayed]

FINDINGS: LEFT VENTRICLE:

Upper limit of normal left ventricular chamber size by volumetric
assessment.

Normal left ventricular wall thickness. Maximal wall thickness 10 mm
at the ventricular septal base.

Mildly reduced left ventricular systolic function.

LVEF = 45%, mild global hypokinesis.

There is post contrast delayed myocardial enhancement. Diffuse
throughout basal, mid and apical distribution. Apparent sparing of
the subendocardium, primarily in the ventricular septum.

Abnormal T1 myocardial nulling kinetics.

ECV = 55%.

These findings in combination suggest an infiltrative process such
as cardiac amyloidosis.

RIGHT VENTRICLE:

Mildly enlarged right ventricular chamber size.

Normal right ventricular wall thickness.

Mildly reduced right ventricular systolic function.

RVEF = 44%

Mild global hypokinesis.

No post contrast delayed myocardial enhancement of the RV free wall.

ATRIA:

Moderate biatrial chamber enlargement.

VALVES:

No significant valvular abnormalities noted on this exam.

PERICARDIUM:

Normal pericardium.  No pericardial effusion.

OTHER:

Dilated IVC.

Cannot exclude possible anomalous pulmonary venous drainage in to
the mid SVC. Consider ECG gated CTA chest for further evaluation.

MEASUREMENTS:
Measurements are approximate values due to motion artifact likely
secondary to arrhythmia.

Left ventricle:

LV male

LV EF: 45% (Normal 56-78%)

Absolute volumes:

LV EDV: 193 mL (Normal 77-195 mL)

LV ESV: 106 mL (Normal 19-72 mL)

LV SV: 87 mL (Normal 51-133 mL)

CO: 4.6  L/min (Normal 2.8-8.8 L/min)

Indexed volumes:

LV EDV: 76 mL/sq-m (Normal 47-92 mL/sq-m)

LV ESV: 42 mL/sq-m (Normal 13-30 mL/sq-m)

LV SV: 34 mL/sq-m (Normal 32-62 mL/sq-m)

CI: 1.8 L/min/sq-m (Normal 1.7-4.2 L/min/sq-m)

Right ventricle:

RV male

RV EF: 44 % (Normal 47-74%)

Absolute volumes:

RV EDV: 226 mL (Normal 88-227 mL)

RV ESV: 149 mL (Normal 23-103 mL)

RV SV: 117 mL (Normal 52-138 mL)

CO: 6.1 L/min (Normal 2.8-8.8 L/min)

Indexed volumes:

RV EDV: 105 ML/sq-m (Normal 55-105 mL/sq-m)

RV ESV: 56 mL/sq-m (Normal 15-43 mL/sq-m)

RV SV: 46 mL/sq-m (Normal 32-64 mL/sq-m)

CI: 2.4  L/min/sq-m (Normal 1.7-4.2 L/min/sq-m)
IMPRESSION: 1. Findings suggestive of infiltrative cardiomyopathy. Native T1 ECV
55%, with diffuse post contrast delayed myocardial enhancement and
abnormal T1 myocardial nulling kinetics, consistent with a diagnosis
of cardiac amyloidosis.

2.  Upper limit of normal biventricular chamber size.

3. Mildly reduced biventricular systolic function. LVEF 45%, RVEF
44%.

4. Cannot exclude possible anomalous pulmonary venous drainage in to
the mid SVC. Seen on axial HASTE image series 3 image 4, but
evaluation limited due to poor spatial resolution. Consider ECG
gated CTA chest for further evaluation.

## 2020-06-04 MED ORDER — GADOBUTROL 1 MMOL/ML IV SOLN
10.0000 mL | Freq: Once | INTRAVENOUS | Status: AC | PRN
  Administered 2020-06-04: 10 mL via INTRAVENOUS

## 2020-06-15 ENCOUNTER — Inpatient Hospital Stay (HOSPITAL_COMMUNITY)
Admission: EM | Admit: 2020-06-15 | Discharge: 2020-06-21 | DRG: 683 | Disposition: A | Discharge status: Home / Self Care | Payer: 59 | Attending: Internal Medicine | Admitting: Internal Medicine

## 2020-06-15 ENCOUNTER — Other Ambulatory Visit: Payer: Self-pay

## 2020-06-15 ENCOUNTER — Emergency Department (HOSPITAL_COMMUNITY): Payer: 59

## 2020-06-15 DIAGNOSIS — E854 Organ-limited amyloidosis: Secondary | ICD-10-CM | POA: Diagnosis present

## 2020-06-15 DIAGNOSIS — I9589 Other hypotension: Secondary | ICD-10-CM | POA: Diagnosis present

## 2020-06-15 DIAGNOSIS — E861 Hypovolemia: Secondary | ICD-10-CM | POA: Diagnosis present

## 2020-06-15 DIAGNOSIS — Z6838 Body mass index (BMI) 38.0-38.9, adult: Secondary | ICD-10-CM | POA: Diagnosis not present

## 2020-06-15 DIAGNOSIS — E860 Dehydration: Secondary | ICD-10-CM | POA: Diagnosis present

## 2020-06-15 DIAGNOSIS — I4892 Unspecified atrial flutter: Secondary | ICD-10-CM | POA: Diagnosis present

## 2020-06-15 DIAGNOSIS — B3781 Candidal esophagitis: Secondary | ICD-10-CM | POA: Diagnosis present

## 2020-06-15 DIAGNOSIS — I43 Cardiomyopathy in diseases classified elsewhere: Secondary | ICD-10-CM | POA: Diagnosis present

## 2020-06-15 DIAGNOSIS — I5042 Chronic combined systolic (congestive) and diastolic (congestive) heart failure: Secondary | ICD-10-CM | POA: Diagnosis present

## 2020-06-15 DIAGNOSIS — E785 Hyperlipidemia, unspecified: Secondary | ICD-10-CM | POA: Diagnosis present

## 2020-06-15 DIAGNOSIS — R7989 Other specified abnormal findings of blood chemistry: Secondary | ICD-10-CM | POA: Diagnosis present

## 2020-06-15 DIAGNOSIS — G4733 Obstructive sleep apnea (adult) (pediatric): Secondary | ICD-10-CM | POA: Diagnosis present

## 2020-06-15 DIAGNOSIS — Z7901 Long term (current) use of anticoagulants: Secondary | ICD-10-CM

## 2020-06-15 DIAGNOSIS — Z888 Allergy status to other drugs, medicaments and biological substances status: Secondary | ICD-10-CM

## 2020-06-15 DIAGNOSIS — I130 Hypertensive heart and chronic kidney disease with heart failure and stage 1 through stage 4 chronic kidney disease, or unspecified chronic kidney disease: Secondary | ICD-10-CM | POA: Diagnosis present

## 2020-06-15 DIAGNOSIS — K254 Chronic or unspecified gastric ulcer with hemorrhage: Secondary | ICD-10-CM | POA: Diagnosis present

## 2020-06-15 DIAGNOSIS — K257 Chronic gastric ulcer without hemorrhage or perforation: Secondary | ICD-10-CM | POA: Diagnosis present

## 2020-06-15 DIAGNOSIS — K7460 Unspecified cirrhosis of liver: Secondary | ICD-10-CM | POA: Diagnosis present

## 2020-06-15 DIAGNOSIS — Z823 Family history of stroke: Secondary | ICD-10-CM

## 2020-06-15 DIAGNOSIS — Z8249 Family history of ischemic heart disease and other diseases of the circulatory system: Secondary | ICD-10-CM | POA: Diagnosis not present

## 2020-06-15 DIAGNOSIS — E871 Hypo-osmolality and hyponatremia: Secondary | ICD-10-CM | POA: Diagnosis present

## 2020-06-15 DIAGNOSIS — M109 Gout, unspecified: Secondary | ICD-10-CM | POA: Diagnosis present

## 2020-06-15 DIAGNOSIS — Z20822 Contact with and (suspected) exposure to covid-19: Secondary | ICD-10-CM | POA: Diagnosis present

## 2020-06-15 DIAGNOSIS — Z79899 Other long term (current) drug therapy: Secondary | ICD-10-CM

## 2020-06-15 DIAGNOSIS — I428 Other cardiomyopathies: Secondary | ICD-10-CM | POA: Diagnosis present

## 2020-06-15 DIAGNOSIS — R112 Nausea with vomiting, unspecified: Secondary | ICD-10-CM | POA: Diagnosis not present

## 2020-06-15 DIAGNOSIS — Z820 Family history of epilepsy and other diseases of the nervous system: Secondary | ICD-10-CM | POA: Diagnosis not present

## 2020-06-15 DIAGNOSIS — N179 Acute kidney failure, unspecified: Secondary | ICD-10-CM | POA: Diagnosis not present

## 2020-06-15 DIAGNOSIS — K760 Fatty (change of) liver, not elsewhere classified: Secondary | ICD-10-CM | POA: Diagnosis present

## 2020-06-15 DIAGNOSIS — N170 Acute kidney failure with tubular necrosis: Principal | ICD-10-CM | POA: Diagnosis present

## 2020-06-15 DIAGNOSIS — K2900 Acute gastritis without bleeding: Secondary | ICD-10-CM | POA: Diagnosis present

## 2020-06-15 DIAGNOSIS — I959 Hypotension, unspecified: Secondary | ICD-10-CM

## 2020-06-15 LAB — CBC
HCT: 55.3 % — ABNORMAL HIGH (ref 39.0–52.0)
Hemoglobin: 18.1 g/dL — ABNORMAL HIGH (ref 13.0–17.0)
MCH: 29.3 pg (ref 26.0–34.0)
MCHC: 32.7 g/dL (ref 30.0–36.0)
MCV: 89.5 fL (ref 80.0–100.0)
Platelets: 240 10*3/uL (ref 150–400)
RBC: 6.18 MIL/uL — ABNORMAL HIGH (ref 4.22–5.81)
RDW: 11.9 % (ref 11.5–15.5)
WBC: 10 10*3/uL (ref 4.0–10.5)
nRBC: 0 % (ref 0.0–0.2)

## 2020-06-15 LAB — COMPREHENSIVE METABOLIC PANEL
ALT: 34 U/L (ref 0–44)
AST: 32 U/L (ref 15–41)
Albumin: 4 g/dL (ref 3.5–5.0)
Alkaline Phosphatase: 86 U/L (ref 38–126)
Anion gap: 15 (ref 5–15)
BUN: 45 mg/dL — ABNORMAL HIGH (ref 6–20)
CO2: 25 mmol/L (ref 22–32)
Calcium: 9.9 mg/dL (ref 8.9–10.3)
Chloride: 92 mmol/L — ABNORMAL LOW (ref 98–111)
Creatinine, Ser: 4.53 mg/dL — ABNORMAL HIGH (ref 0.61–1.24)
GFR calc Af Amer: 18 mL/min — ABNORMAL LOW (ref >60)
GFR calc non Af Amer: 16 mL/min — ABNORMAL LOW (ref >60)
Glucose, Bld: 130 mg/dL — ABNORMAL HIGH (ref 70–99)
Potassium: 4.2 mmol/L (ref 3.5–5.1)
Sodium: 132 mmol/L — ABNORMAL LOW (ref 135–145)
Total Bilirubin: 6.9 mg/dL — ABNORMAL HIGH (ref 0.3–1.2)
Total Protein: 7.8 g/dL (ref 6.5–8.1)

## 2020-06-15 LAB — LACTIC ACID, PLASMA: Lactic Acid, Venous: 1.8 mmol/L (ref 0.5–1.9)

## 2020-06-15 LAB — CBG MONITORING, ED: Glucose-Capillary: 127 mg/dL — ABNORMAL HIGH (ref 70–99)

## 2020-06-15 LAB — SARS CORONAVIRUS 2 BY RT PCR (HOSPITAL ORDER, PERFORMED IN ~~LOC~~ HOSPITAL LAB): SARS Coronavirus 2: NEGATIVE

## 2020-06-15 LAB — BRAIN NATRIURETIC PEPTIDE: B Natriuretic Peptide: 60.3 pg/mL (ref 0.0–100.0)

## 2020-06-15 IMAGING — DX DG ABDOMEN 1V
1 series · 4 of 4 positions shown · non-contrast
Comparison: Radiograph dated [DATE].

CLINICAL DATA: 38-year-old male with shortness of breath.

EXAM:
ABDOMEN - 1 VIEW

[Series 1: abdomen · 0.14mm/px · 4 of 4 slices shown]
[im 1/4]
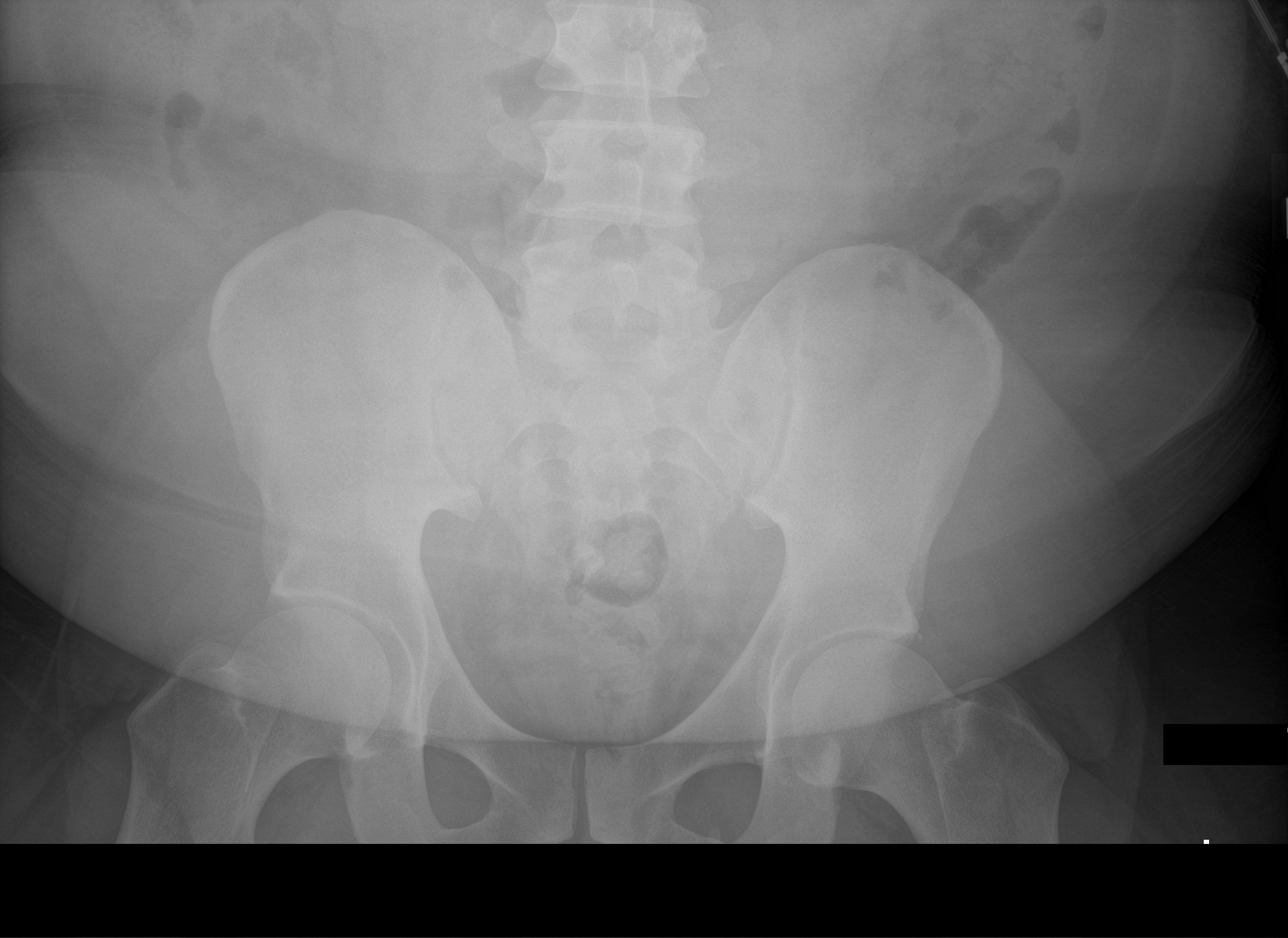
[im 2/4]
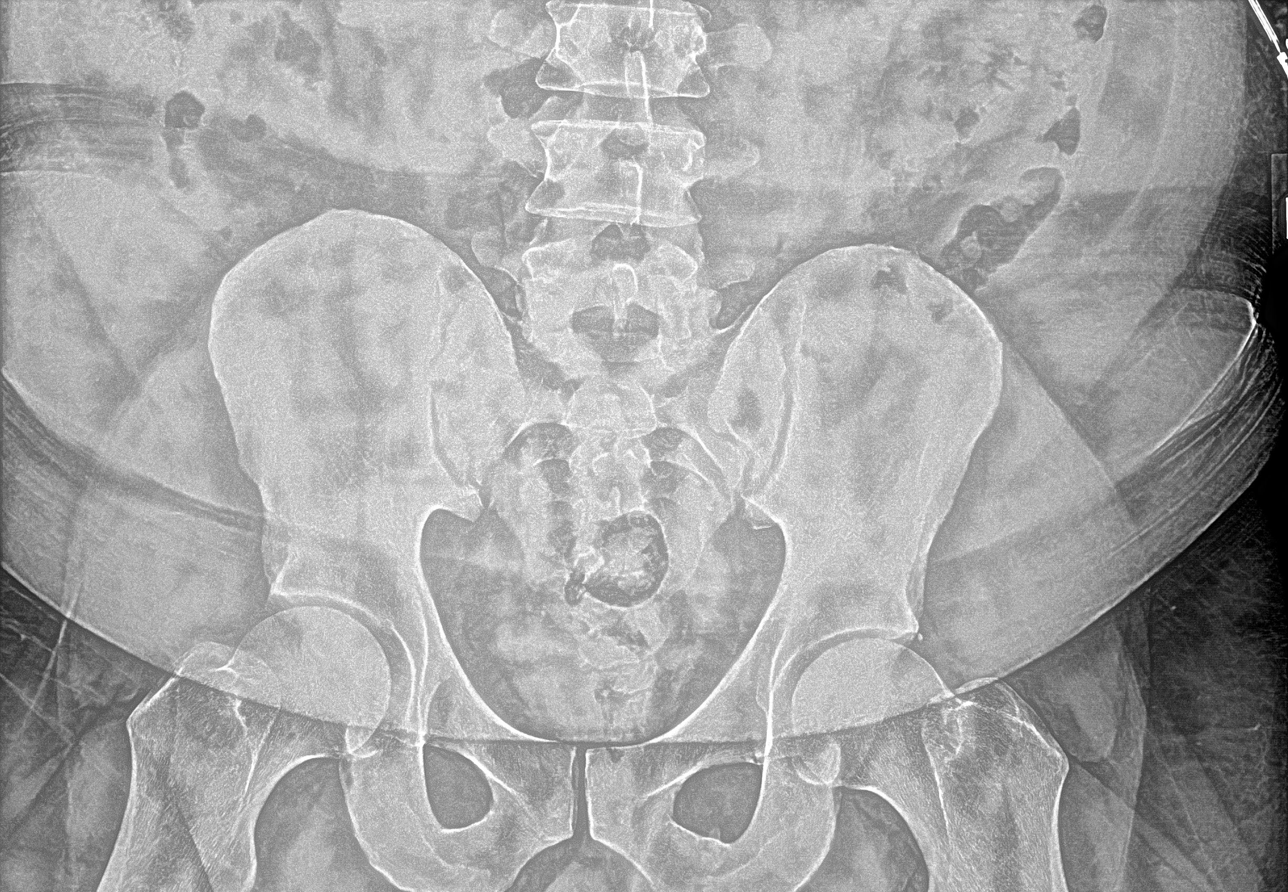
[im 3/4]
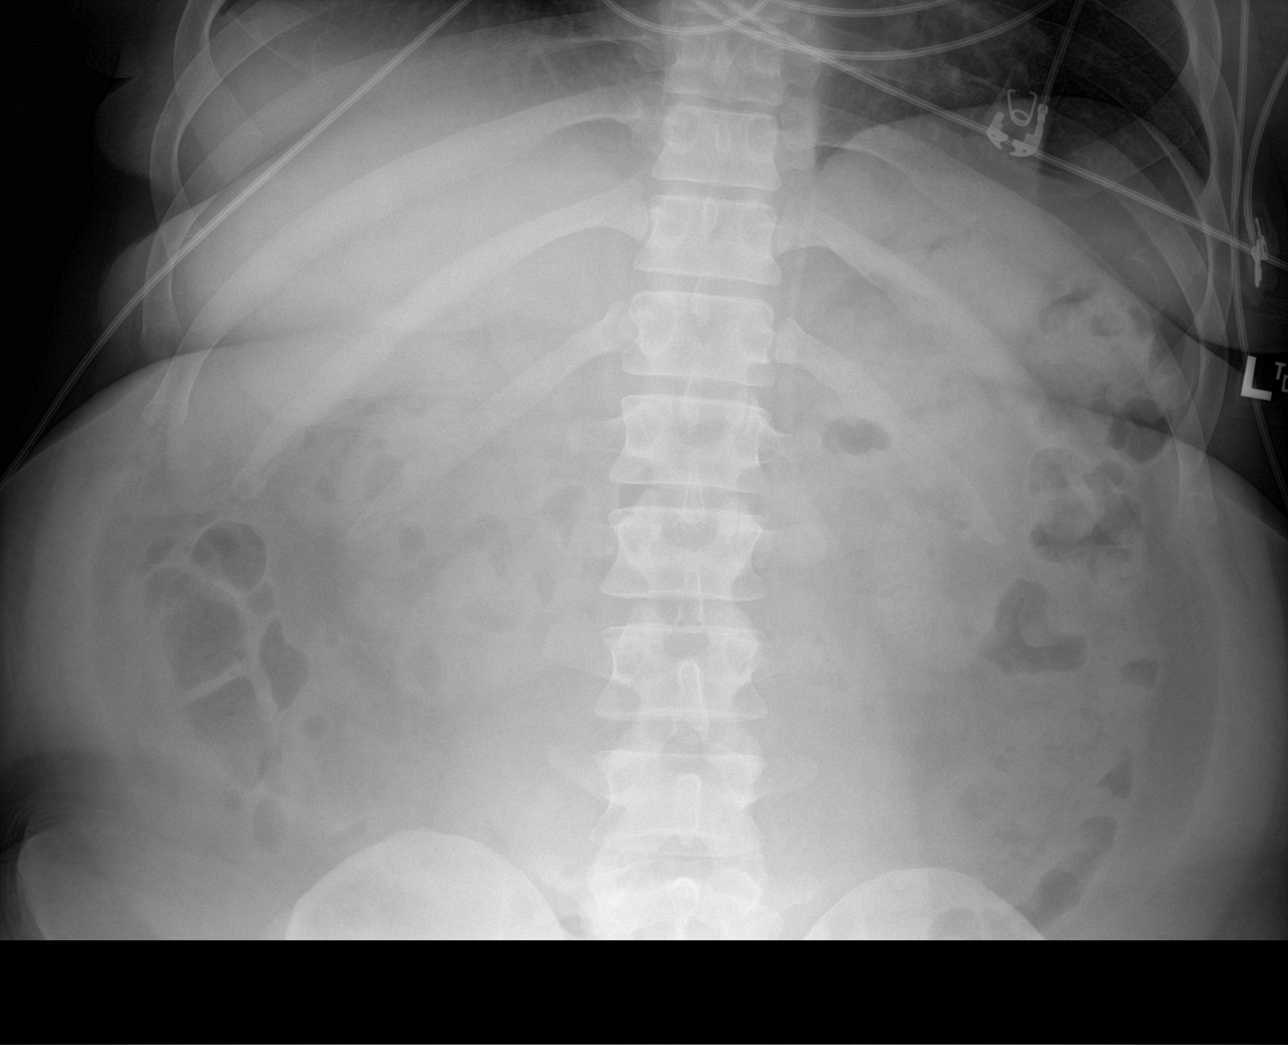
[im 4/4]
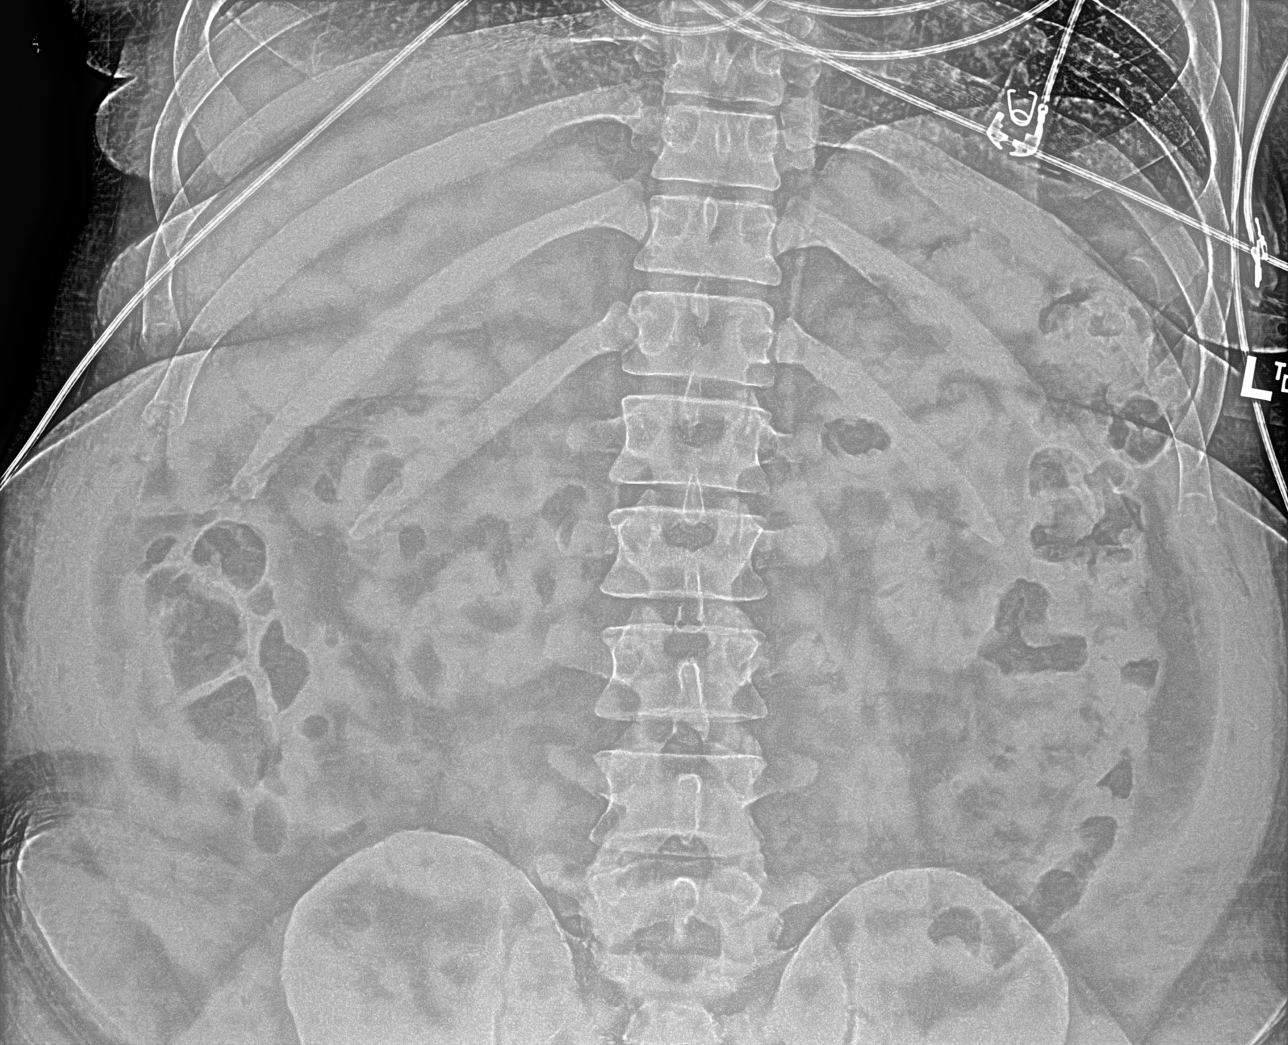

[4 of 4 positions shown; findings below may reference images not displayed]

FINDINGS: There is no bowel dilatation or evidence of obstruction. No free air
or radiopaque calculi. The osseous structures and soft tissues are
grossly unremarkable.
IMPRESSION: Negative.

## 2020-06-15 IMAGING — DX DG CHEST 1V PORT
1 series · 2 of 2 positions shown · non-contrast
Comparison: [DATE]

CLINICAL DATA: Shortness of breath and dehydration.

EXAM:
PORTABLE CHEST 1 VIEW

[Series 1: chest · 0.14mm/px · 2 of 2 slices shown]
[im 1/2]
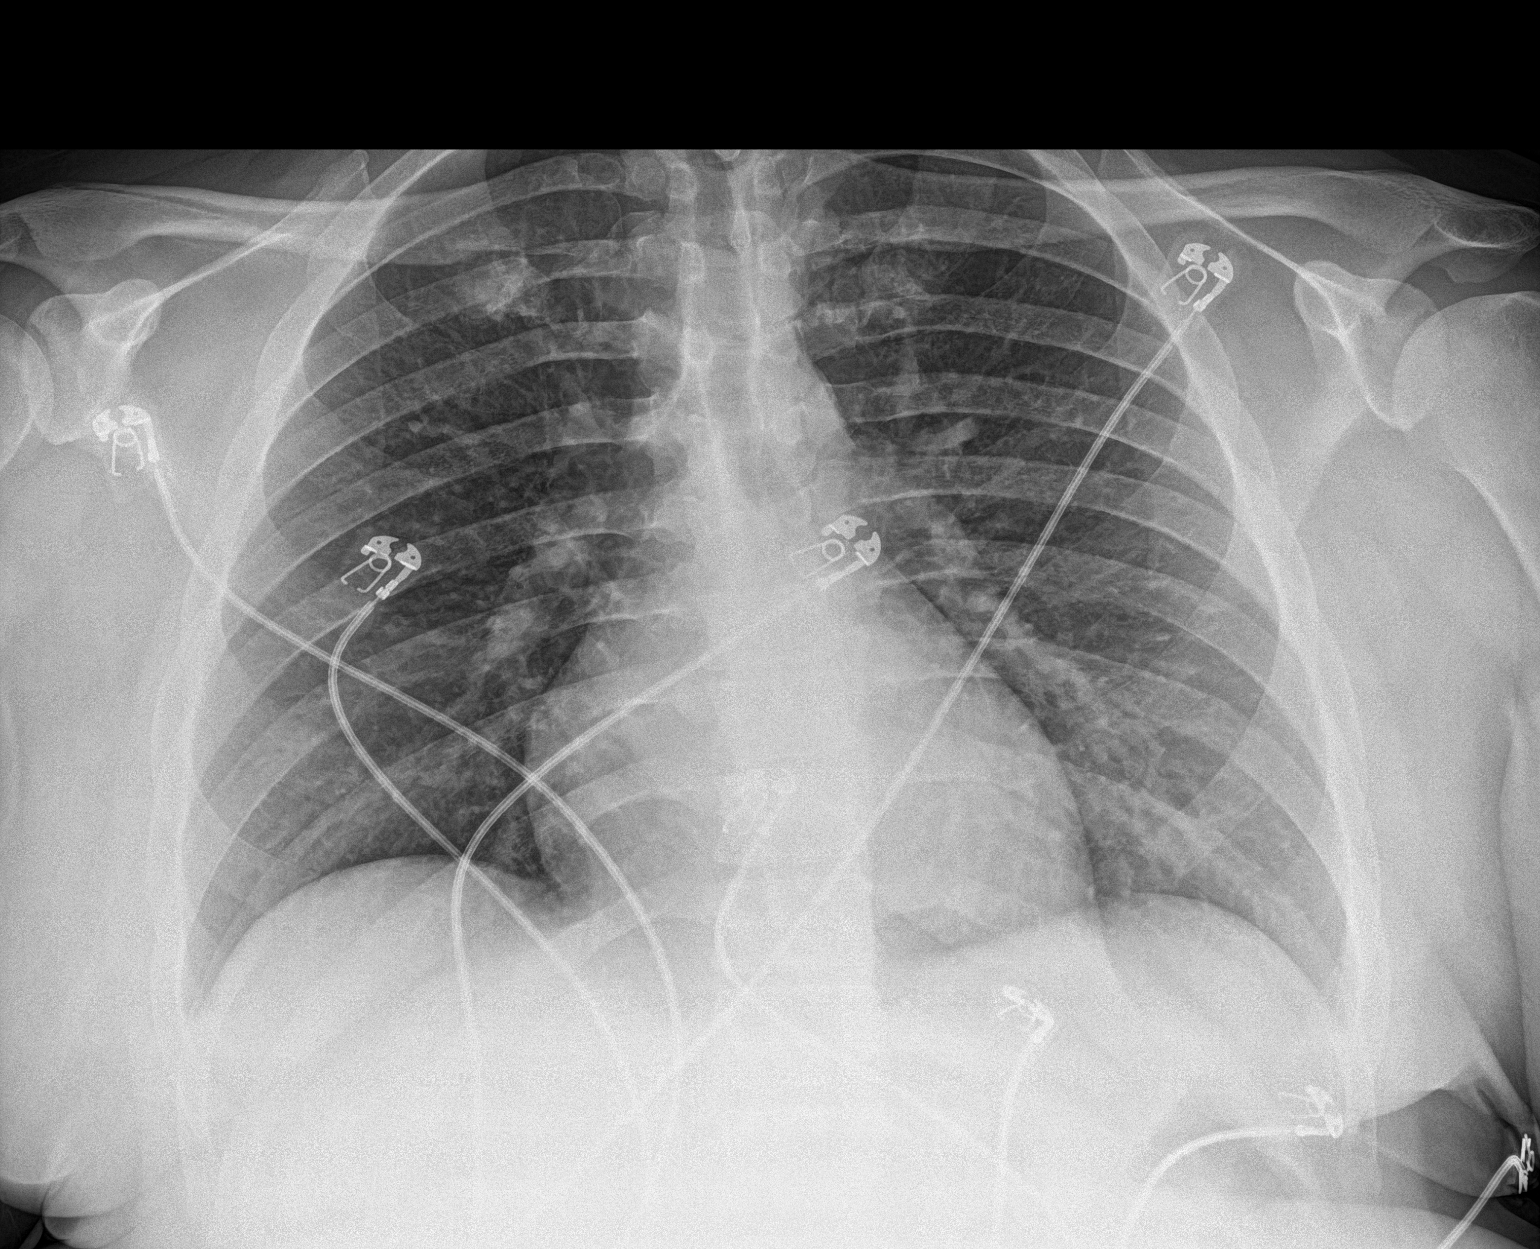
[im 2/2]
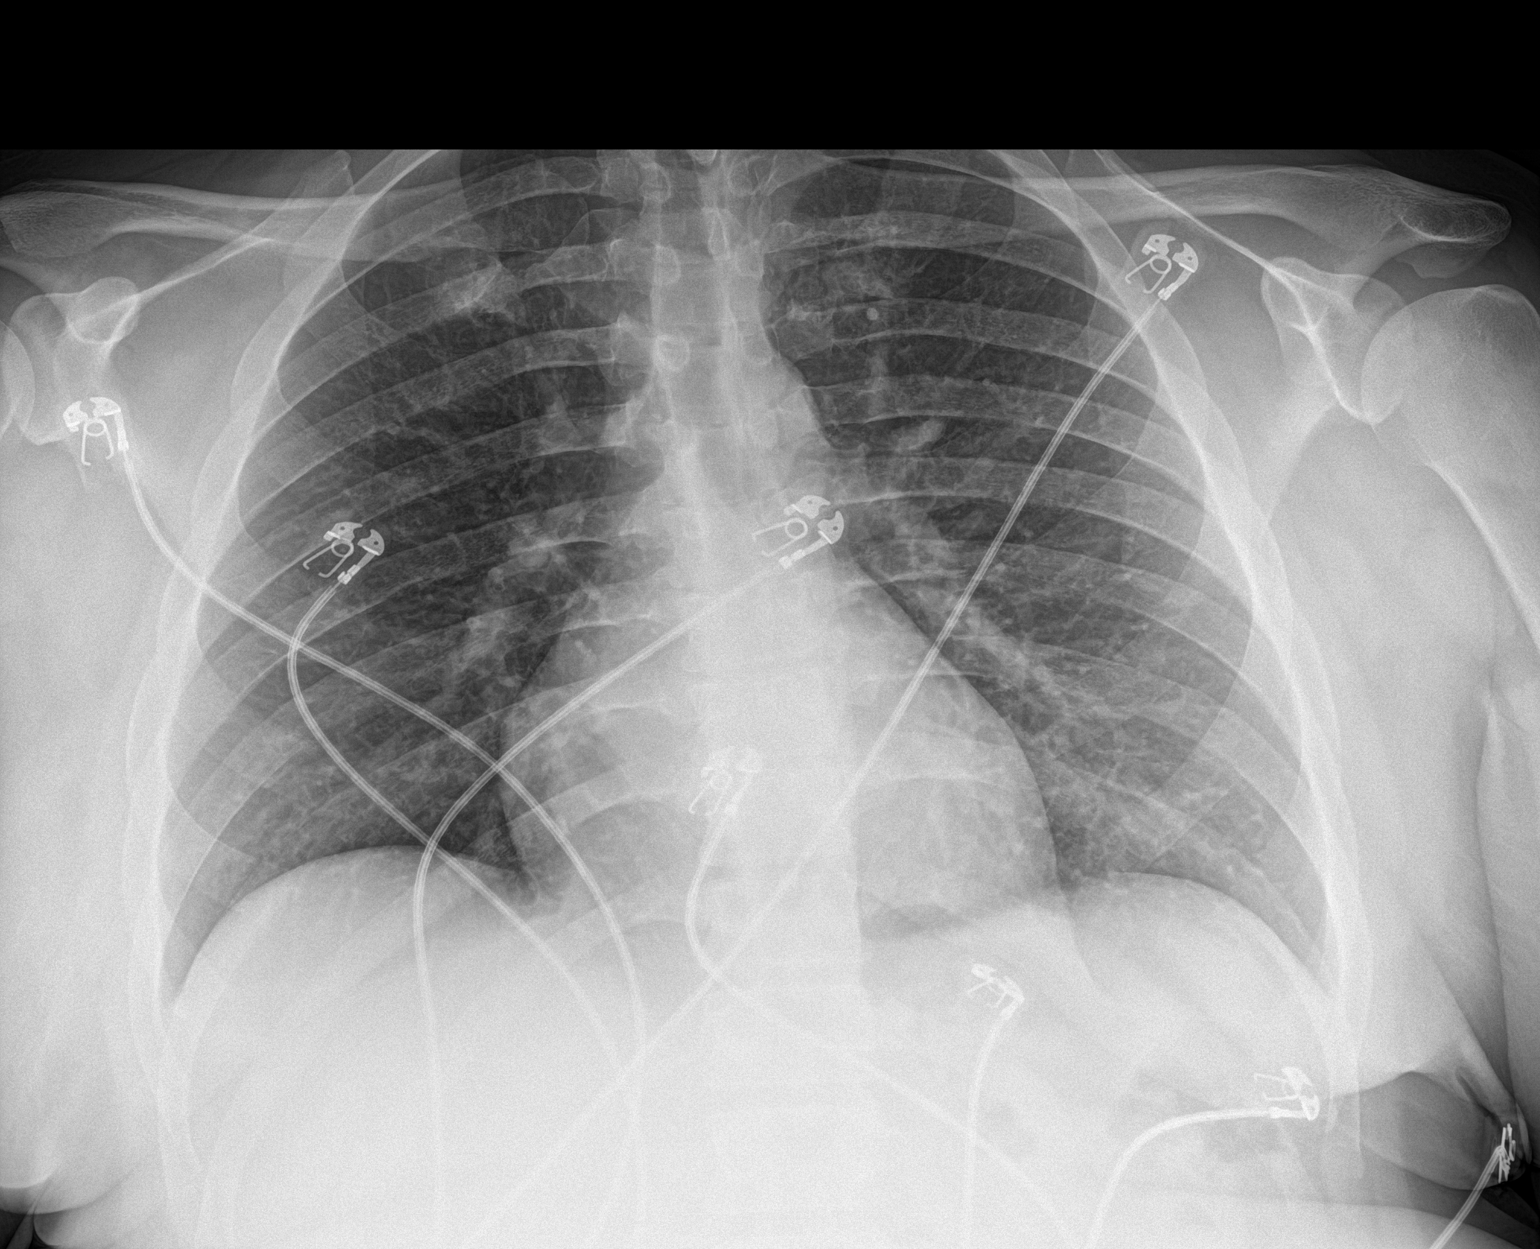

[2 of 2 positions shown; findings below may reference images not displayed]

FINDINGS: There is no evidence of acute infiltrate, pleural effusion or
pneumothorax. The heart size and mediastinal contours are within
normal limits. The visualized skeletal structures are unremarkable.
IMPRESSION: No active disease.

## 2020-06-15 MED ORDER — ONDANSETRON HCL 4 MG/2ML IJ SOLN
4.0000 mg | Freq: Once | INTRAMUSCULAR | Status: AC
  Administered 2020-06-15: 4 mg via INTRAVENOUS
  Filled 2020-06-15: qty 2

## 2020-06-15 MED ORDER — SODIUM CHLORIDE 0.9 % IV BOLUS
500.0000 mL | Freq: Once | INTRAVENOUS | Status: AC
  Administered 2020-06-15: 500 mL via INTRAVENOUS

## 2020-06-15 NOTE — ED Provider Notes (Signed)
Abnormal [facility] EMERGENCY DEPARTMENT Provider Note   CSN: [id number] Arrival date & time: 06/15/20  1739     History Chief Complaint  Patient presents with  . Abnormal Lab    GREAT ISOBE is a 39 y.o. male.  Patient is a 39 year old male with a history of atrial fibrillation on Eliquis, CHF, hypertension who presents with nausea and vomiting. He says for about the last year he has had recurrent episodes of cyclical vomiting. This most recent episode has lasted for about a week. He tends to do okay during the day but has multiple episodes of vomiting in the evening hours. He said his emesis is nonbloody and nonbilious. No diarrhea. No fevers. No associate abdominal pain. Over the last 2 to 3 days he has felt a little more short of breath than normal and has had generalized weakness with some lightheadedness. He has an associated headache which he says he typically gets when the symptoms occur. No chest pain. No cough or cold symptoms. No fevers. No urinary symptoms. He had some blood work at his PCPs office which she says indicated that his kidneys were not working and that he needed to come to the ED for further evaluation. He recently said that he has been evaluated for possible cirrhosis. It was felt that his liver may be partly responsible for his cyclical vomiting. This is reported per the patient. He is followed by [facility] physicians. He denies any history of alcohol use.        Past Medical History:  Diagnosis Date  . Atrial flutter (HCC)    a. s/p ablation 03/2018.  Marland Kitchen Chronic combined systolic and diastolic CHF (congestive heart failure) (HCC)   . History of esophagogastroduodenoscopy (EGD)   . Morbid obesity (HCC)   . NICM (nonischemic cardiomyopathy) (HCC)   . PVC's (premature ventricular contractions)   . Sleep apnea     Patient Active Problem List   Diagnosis Date Noted  . Acute kidney injury (HCC) 06/15/2020  . Paroxysmal atrial fibrillation  (HCC) 04/29/2020  . Acute on chronic combined systolic and diastolic CHF (congestive heart failure) (HCC) 04/07/2018  . NICM (nonischemic cardiomyopathy) (HCC) 04/07/2018  . Hyperglycemia 04/07/2018  . Typical atrial flutter (HCC) 04/06/2018  . Atrial flutter (HCC) 04/06/2018  . Gout 03/30/2017  . Suspected sleep apnea 03/01/2017  . Edema 02/24/2017  . Testicular swelling 02/24/2017  . CHF (congestive heart failure) (HCC) 02/24/2017  . Overweight 02/24/2017  . Costochondritis 02/24/2017  . Sinus tachycardia 02/24/2017  . First degree AV block 02/24/2017  . Scrotal edema 02/24/2017  . Morbid obesity (HCC) 02/24/2017    Past Surgical History:  Procedure Laterality Date  . A-FLUTTER ABLATION N/A 04/06/2018   Procedure: A-FLUTTER ABLATION;  Surgeon: Marinus Maw, MD;  Location: [facility] INVASIVE CV LAB;  Service: Cardiovascular;  Laterality: N/A;  . NO PAST SURGERIES    . RIGHT HEART CATH N/A 12/20/2019   Procedure: RIGHT HEART CATH;  Surgeon: Dolores Patty, MD;  Location: [facility] INVASIVE CV LAB;  Service: Cardiovascular;  Laterality: N/A;  . RIGHT/LEFT HEART CATH AND CORONARY ANGIOGRAPHY N/A 03/01/2017   Procedure: Right/Left Heart Cath and Coronary Angiography;  Surgeon: Dolores Patty, MD;  Location: [facility] INVASIVE CV LAB;  Service: Cardiovascular;  Laterality: N/A;       Family History  Problem Relation Age of Onset  . CVA Mother        pacemaker  . Multiple sclerosis Mother   . Seizures Mother   .  Hypertension Father   . Gout Father     Social History   Tobacco Use  . Smoking status: Never Smoker  . Smokeless tobacco: Never Used  Vaping Use  . Vaping Use: Never used  Substance Use Topics  . Alcohol use: Yes    Comment: rarely  . Drug use: No    Home Medications Prior to Admission medications   Medication Sig Start Date End Date Taking? Authorizing Provider  acetaminophen (TYLENOL) 500 MG tablet Take 1,000 mg by mouth every 6 (six) hours as needed for moderate  pain or headache.    Yes [provider]  allopurinol (ZYLOPRIM) 100 MG tablet Take 2 tablets (200 mg total) by mouth daily. Please contact PCP for further refills 04/10/19  Yes Bensimhon, Bevelyn Buckles, MD  apixaban (ELIQUIS) 5 MG TABS tablet Take 1 tablet (5 mg total) by mouth 2 (two) times daily. 04/20/20  Yes Bensimhon, Bevelyn Buckles, MD  carvedilol (COREG) 12.5 MG tablet TAKE 1 TABLET (12.5 MG TOTAL) BY MOUTH 2 (TWO) TIMES DAILY WITH A MEAL. 03/11/20  Yes Bensimhon, Bevelyn Buckles, MD  empagliflozin (JARDIANCE) 10 MG TABS tablet Take 10 mg by mouth daily before breakfast. 12/18/19  Yes Bensimhon, Bevelyn Buckles, MD  losartan (COZAAR) 100 MG tablet Take 1 tablet (100 mg total) by mouth daily. 05/13/20  Yes Bensimhon, Bevelyn Buckles, MD  MITIGARE 0.6 MG CAPS Take 0.6 mg by mouth daily as needed (gout flare). 04/10/19  Yes Bensimhon, Bevelyn Buckles, MD  ondansetron (ZOFRAN) 4 MG tablet Take 4 mg by mouth every 8 (eight) hours as needed. 02/17/20  Yes [provider]  spironolactone (ALDACTONE) 25 MG tablet TAKE 1 TABLET BY MOUTH EVERYDAY AT BEDTIME Patient taking differently: Take 25 mg by mouth daily.  02/10/20  Yes Bensimhon, Bevelyn Buckles, MD  torsemide (DEMADEX) 20 MG tablet Take 1 tablet (20 mg total) by mouth as needed. 06/03/20  Yes Bensimhon, Bevelyn Buckles, MD    Allergies    Sherryll Burger [sacubitril-valsartan]  Review of Systems   Review of Systems  Constitutional: Positive for fatigue. Negative for chills, diaphoresis and fever.  HENT: Negative for congestion, rhinorrhea and sneezing.   Eyes: Negative.   Respiratory: Negative for cough, chest tightness and shortness of breath.   Cardiovascular: Negative for chest pain and leg swelling.  Gastrointestinal: Positive for nausea and vomiting. Negative for abdominal pain, blood in stool and diarrhea.  Genitourinary: Negative for difficulty urinating, flank pain, frequency and hematuria.  Musculoskeletal: Negative for arthralgias and back pain.  Skin: Negative for rash.    Neurological: Positive for light-headedness and headaches. Negative for dizziness, speech difficulty, weakness and numbness.    Physical Exam Updated Vital Signs BP (!) 88/59   Pulse 70   Temp 97.7 F (36.5 C) (Oral)   Resp 16   SpO2 100%   Physical Exam Constitutional:      Appearance: He is well-developed.  HENT:     Head: Normocephalic and atraumatic.  Eyes:     Pupils: Pupils are equal, round, and reactive to light.  Cardiovascular:     Rate and Rhythm: Normal rate and regular rhythm.     Heart sounds: Normal heart sounds.  Pulmonary:     Effort: Pulmonary effort is normal. No respiratory distress.     Breath sounds: Normal breath sounds. No wheezing or rales.  Chest:     Chest wall: No tenderness.  Abdominal:     General: Bowel sounds are normal.     Palpations: Abdomen is  soft.     Tenderness: There is no abdominal tenderness. There is no guarding or rebound.  Musculoskeletal:        General: Swelling (2+ pitting edema to lower extremities bilaterally) present. Normal range of motion.     Cervical back: Normal range of motion and neck supple.  Lymphadenopathy:     Cervical: No cervical adenopathy.  Skin:    General: Skin is warm and dry.     Findings: No rash.  Neurological:     Mental Status: He is alert and oriented to person, place, and time.     ED Results / Procedures / Treatments   Labs (all labs ordered are listed, but only abnormal results are displayed) Labs Reviewed  CBC - Abnormal; Notable for the following components:      Result Value   RBC 6.18 (*)    Hemoglobin 18.1 (*)    HCT 55.3 (*)    All other components within normal limits  COMPREHENSIVE METABOLIC PANEL - Abnormal; Notable for the following components:   Sodium 132 (*)    Chloride 92 (*)    Glucose, Bld 130 (*)    BUN 45 (*)    Creatinine, Ser 4.53 (*)    Total Bilirubin 6.9 (*)    GFR calc non Af Amer 16 (*)    GFR calc Af Amer 18 (*)    All other components within normal  limits  CBG MONITORING, ED - Abnormal; Notable for the following components:   Glucose-Capillary 127 (*)    All other components within normal limits  SARS CORONAVIRUS 2 BY RT PCR (HOSPITAL ORDER, PERFORMED IN [place] [facility])  CULTURE, BLOOD (ROUTINE X 2)  CULTURE, BLOOD (ROUTINE X 2)  LACTIC ACID, PLASMA  BRAIN NATRIURETIC PEPTIDE  LACTIC ACID, PLASMA    EKG EKG Interpretation  Date/Time:  Monday June 15 2020 18:49:02 EDT Ventricular Rate:  77 PR Interval:    QRS Duration: 90 QT Interval:  388 QTC Calculation: 439 R Axis:   91 Text Interpretation: probable atrial fibrillation Rightward axis Inferior infarct , age undetermined Anterior infarct , age undetermined Abnormal ECG Confirmed by Rolan Bucco [phone]) on 06/15/2020 8:41:40 PM   Radiology DG Abdomen 1 View  Result Date: 06/15/2020 CLINICAL DATA:  39 year old male with shortness of breath. EXAM: ABDOMEN - 1 VIEW COMPARISON:  Radiograph dated 02/14/2019. FINDINGS: There is no bowel dilatation or evidence of obstruction. No free air or radiopaque calculi. The osseous structures and soft tissues are grossly unremarkable. IMPRESSION: Negative. Electronically Signed   By: Elgie Collard M.D.   On: 06/15/2020 20:26   DG Chest Port 1 View  Result Date: 06/15/2020 CLINICAL DATA:  Shortness of breath and dehydration. EXAM: PORTABLE CHEST 1 VIEW COMPARISON:  February 25, 2017 FINDINGS: There is no evidence of acute infiltrate, pleural effusion or pneumothorax. The heart size and mediastinal contours are within normal limits. The visualized skeletal structures are unremarkable. IMPRESSION: No active disease. Electronically Signed   By: Aram Candela M.D.   On: 06/15/2020 20:25    Procedures Ultrasound ED Echo  Date/Time: 06/15/2020 11:33 PM Performed by: Rolan Bucco, MD Authorized by: Rolan Bucco, MD   Procedure details:    Indications: hypotension     Views: subxiphoid     Images: archived     Limitations:   Body habitus Findings:    Pericardium: no pericardial effusion   Impression:    Impression: normal     (including critical care time)  Medications  Ordered in ED Medications  sodium chloride 0.9 % bolus 500 mL (0 mLs Intravenous Stopped 06/15/20 2100)  ondansetron (ZOFRAN) injection 4 mg (4 mg Intravenous Given 06/15/20 2056)  sodium chloride 0.9 % bolus 500 mL (0 mLs Intravenous Stopped 06/15/20 2145)  sodium chloride 0.9 % bolus 500 mL (0 mLs Intravenous Stopped 06/15/20 2313)  ondansetron (ZOFRAN) injection 4 mg (4 mg Intravenous Given 06/15/20 2325)  sodium chloride 0.9 % bolus 500 mL (500 mLs Intravenous New Bag/Given 06/15/20 2315)    ED Course  I have reviewed the triage vital signs and the nursing notes.  Pertinent labs & imaging results that were available during my care of the patient were reviewed by me and considered in my medical decision making (see chart for details).    MDM Rules/Calculators/A&P                          Patient is a 39 year old male who presents labs and hypertension.  He was markedly hypertensive on arrival.  Bedside ultrasound was performed which showed no evidence of pericardial effusion.  He had good cardiac contractility.  He has no associated abdominal pain.  He does have evidence of a marked acute kidney injury on labs.  He has a normal white blood cell count.  Normal lactate.  I feel his hypotension is likely related to dehydration related to his ongoing vomiting.  This is potentially the etiology for his acute kidney injury as well.  He does not have any evidence of hepatitis.  His hemoglobin is stable without evidence of acute hemorrhage.  He does not have any suggestions of sepsis.  He was hydrated cautiously given his underlying CHF.  He was given a total of 2 L of normal saline.  His blood pressure gradually improved and went up into the 90s and low 100s systolic.  He is feeling a little bit better.  He has had some nausea but no active vomiting in the ED.   I spoke with Dr.Shalhoub, with the hospitalist service who will admit the pt.  CRITICAL CARE Performed by: Rolan Bucco Total critical care time: 90 minutes Critical care time was exclusive of separately billable procedures and treating other patients. Critical care was necessary to treat or prevent imminent or life-threatening deterioration. Critical care was time spent personally by me on the following activities: development of treatment plan with patient and/or surrogate as well as nursing, discussions with consultants, evaluation of patient's response to treatment, examination of patient, obtaining history from patient or surrogate, ordering and performing treatments and interventions, ordering and review of laboratory studies, ordering and review of radiographic studies, pulse oximetry and re-evaluation of patient's condition.  Final Clinical Impression(s) / ED Diagnoses Final diagnoses:  Hypotension, unspecified hypotension type  Dehydration  AKI (acute kidney injury) [facility])    Rx / DC Orders ED Discharge Orders    None       Rolan Bucco, MD 06/15/20 2336

## 2020-06-15 NOTE — ED Notes (Signed)
Portable Xray at bedside.

## 2020-06-16 ENCOUNTER — Encounter (HOSPITAL_COMMUNITY): Payer: Self-pay | Admitting: Internal Medicine

## 2020-06-16 ENCOUNTER — Inpatient Hospital Stay (HOSPITAL_COMMUNITY): Payer: 59

## 2020-06-16 DIAGNOSIS — K7460 Unspecified cirrhosis of liver: Secondary | ICD-10-CM

## 2020-06-16 DIAGNOSIS — R112 Nausea with vomiting, unspecified: Secondary | ICD-10-CM | POA: Diagnosis present

## 2020-06-16 DIAGNOSIS — I9589 Other hypotension: Secondary | ICD-10-CM

## 2020-06-16 DIAGNOSIS — E860 Dehydration: Secondary | ICD-10-CM

## 2020-06-16 DIAGNOSIS — E861 Hypovolemia: Secondary | ICD-10-CM

## 2020-06-16 DIAGNOSIS — I4892 Unspecified atrial flutter: Secondary | ICD-10-CM

## 2020-06-16 DIAGNOSIS — N179 Acute kidney failure, unspecified: Secondary | ICD-10-CM

## 2020-06-16 DIAGNOSIS — E871 Hypo-osmolality and hyponatremia: Secondary | ICD-10-CM

## 2020-06-16 DIAGNOSIS — I5042 Chronic combined systolic (congestive) and diastolic (congestive) heart failure: Secondary | ICD-10-CM

## 2020-06-16 LAB — LIPASE, BLOOD: Lipase: 69 U/L — ABNORMAL HIGH (ref 11–51)

## 2020-06-16 LAB — RAPID URINE DRUG SCREEN, HOSP PERFORMED
Amphetamines: NOT DETECTED
Barbiturates: NOT DETECTED
Benzodiazepines: NOT DETECTED
Cocaine: NOT DETECTED
Opiates: NOT DETECTED
Tetrahydrocannabinol: NOT DETECTED

## 2020-06-16 LAB — CBC WITH DIFFERENTIAL/PLATELET
Abs Immature Granulocytes: 0.02 10*3/uL (ref 0.00–0.07)
Basophils Absolute: 0.1 10*3/uL (ref 0.0–0.1)
Basophils Relative: 1 %
Eosinophils Absolute: 0.1 10*3/uL (ref 0.0–0.5)
Eosinophils Relative: 1 %
HCT: 57.1 % — ABNORMAL HIGH (ref 39.0–52.0)
Hemoglobin: 18.3 g/dL — ABNORMAL HIGH (ref 13.0–17.0)
Immature Granulocytes: 0 %
Lymphocytes Relative: 23 %
Lymphs Abs: 1.8 10*3/uL (ref 0.7–4.0)
MCH: 29.4 pg (ref 26.0–34.0)
MCHC: 32 g/dL (ref 30.0–36.0)
MCV: 91.7 fL (ref 80.0–100.0)
Monocytes Absolute: 0.9 10*3/uL (ref 0.1–1.0)
Monocytes Relative: 11 %
Neutro Abs: 5.2 10*3/uL (ref 1.7–7.7)
Neutrophils Relative %: 64 %
Platelets: 190 10*3/uL (ref 150–400)
RBC: 6.23 MIL/uL — ABNORMAL HIGH (ref 4.22–5.81)
RDW: 11.9 % (ref 11.5–15.5)
WBC: 8 10*3/uL (ref 4.0–10.5)
nRBC: 0 % (ref 0.0–0.2)

## 2020-06-16 LAB — PROTIME-INR
INR: 1.3 — ABNORMAL HIGH (ref 0.8–1.2)
Prothrombin Time: 15.4 seconds — ABNORMAL HIGH (ref 11.4–15.2)

## 2020-06-16 LAB — HIV ANTIBODY (ROUTINE TESTING W REFLEX): HIV Screen 4th Generation wRfx: NONREACTIVE

## 2020-06-16 LAB — URINALYSIS, COMPLETE (UACMP) WITH MICROSCOPIC
Bacteria, UA: NONE SEEN
Bilirubin Urine: NEGATIVE
Glucose, UA: >=500 mg/dL — AB
Hgb urine dipstick: NEGATIVE
Ketones, ur: 5 mg/dL — AB
Leukocytes,Ua: NEGATIVE
Nitrite: NEGATIVE
Protein, ur: 30 mg/dL — AB
Specific Gravity, Urine: 1.016 (ref 1.005–1.030)
pH: 5 (ref 5.0–8.0)

## 2020-06-16 LAB — COMPREHENSIVE METABOLIC PANEL
ALT: 38 U/L (ref 0–44)
AST: 39 U/L (ref 15–41)
Albumin: 3.8 g/dL (ref 3.5–5.0)
Alkaline Phosphatase: 90 U/L (ref 38–126)
Anion gap: 19 — ABNORMAL HIGH (ref 5–15)
BUN: 38 mg/dL — ABNORMAL HIGH (ref 6–20)
CO2: 20 mmol/L — ABNORMAL LOW (ref 22–32)
Calcium: 9.9 mg/dL (ref 8.9–10.3)
Chloride: 98 mmol/L (ref 98–111)
Creatinine, Ser: 2.77 mg/dL — ABNORMAL HIGH (ref 0.61–1.24)
GFR calc Af Amer: 33 mL/min — ABNORMAL LOW (ref >60)
GFR calc non Af Amer: 28 mL/min — ABNORMAL LOW (ref >60)
Glucose, Bld: 86 mg/dL (ref 70–99)
Potassium: 5 mmol/L (ref 3.5–5.1)
Sodium: 137 mmol/L (ref 135–145)
Total Bilirubin: 5.5 mg/dL — ABNORMAL HIGH (ref 0.3–1.2)
Total Protein: 7.2 g/dL (ref 6.5–8.1)

## 2020-06-16 LAB — APTT: aPTT: 28 seconds (ref 24–36)

## 2020-06-16 LAB — HEMOGLOBIN A1C
Hgb A1c MFr Bld: 4.7 % — ABNORMAL LOW (ref 4.8–5.6)
Mean Plasma Glucose: 88.19 mg/dL

## 2020-06-16 IMAGING — US US RENAL
1 series · 14 of 25 positions shown · non-contrast
Comparison: MRI [DATE].

CLINICAL DATA: Acute renal failure.

EXAM:
RENAL / URINARY TRACT ULTRASOUND COMPLETE

[Series 1: us renal · 14 of 39 slices shown]
[im 1/39]
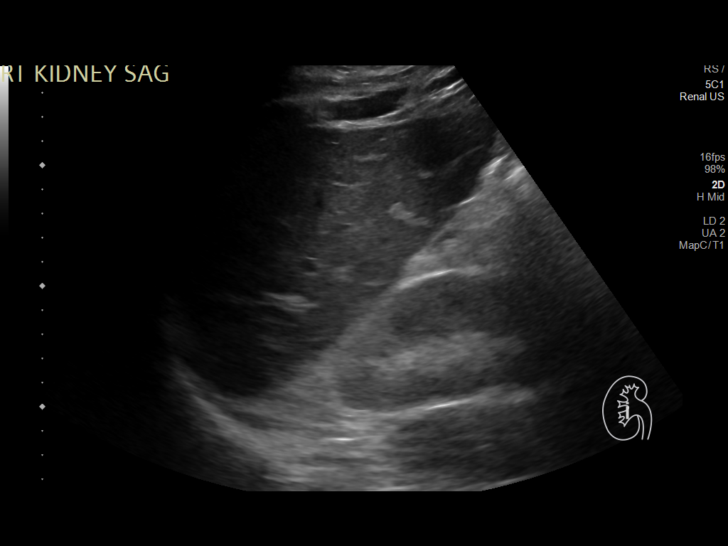
[im 4/39]
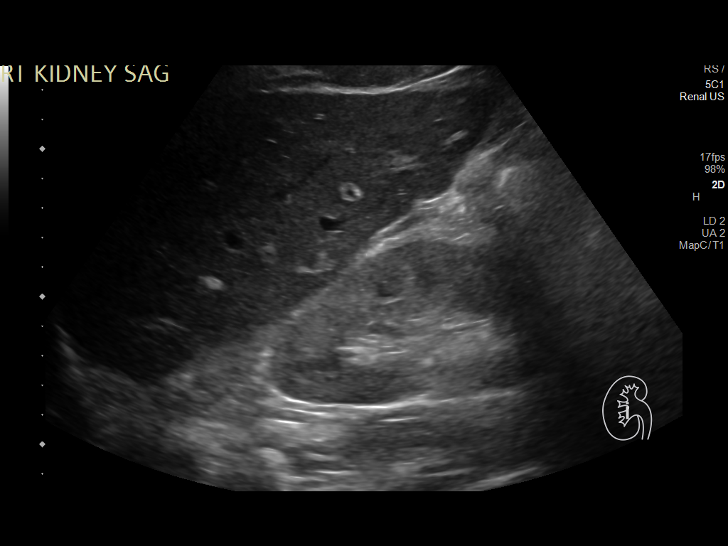
[im 7/39]
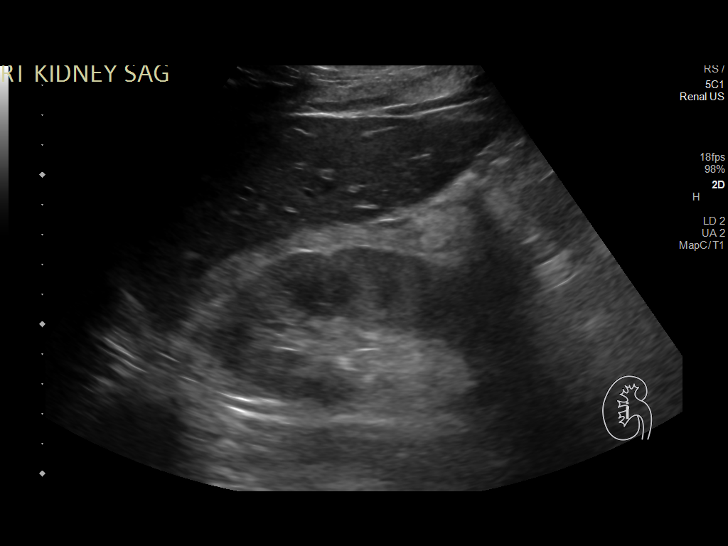
[im 10/39]
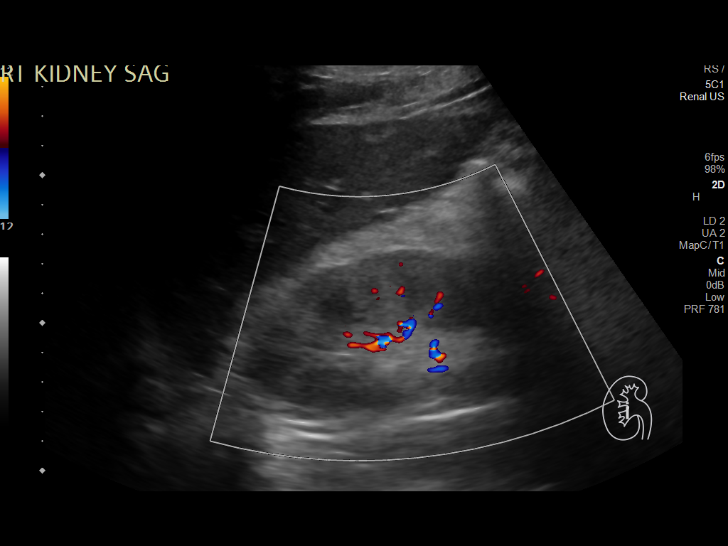
[im 13/39]
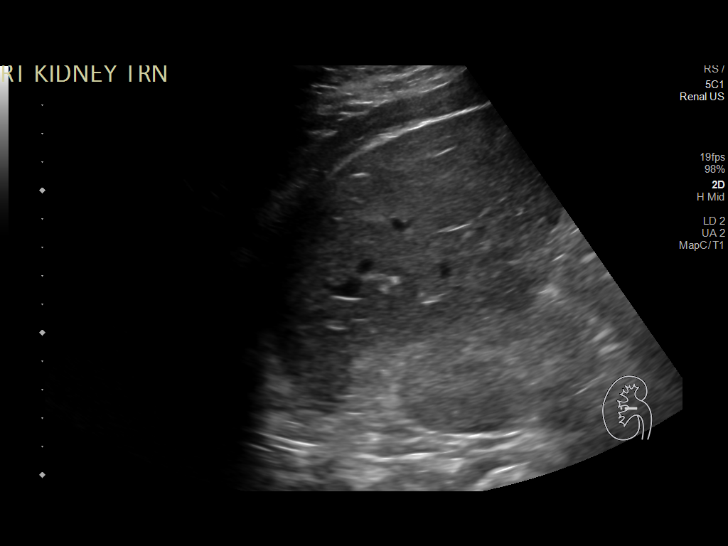
[im 15/39]
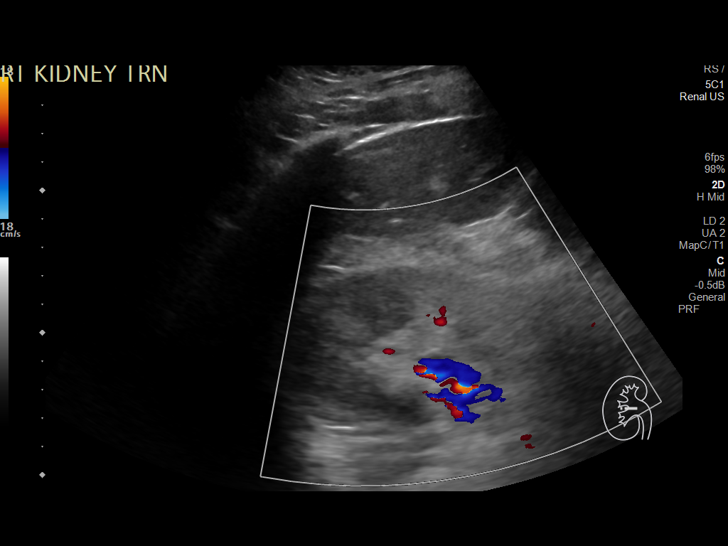
[im 18/39]
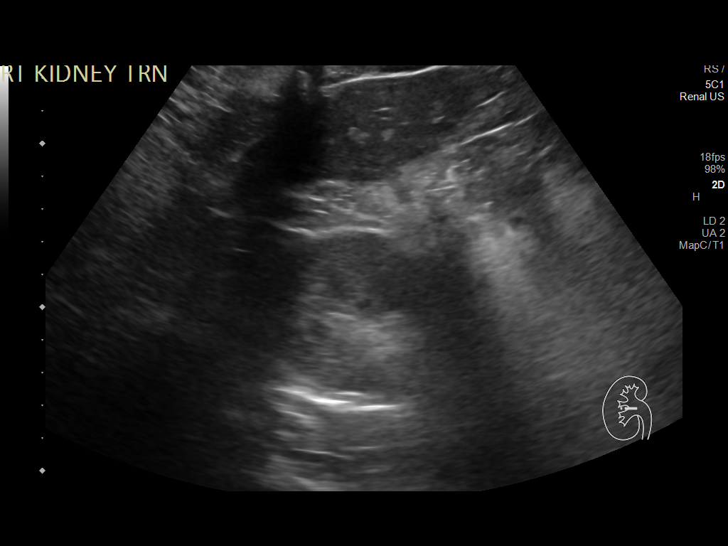
[im 21/39]
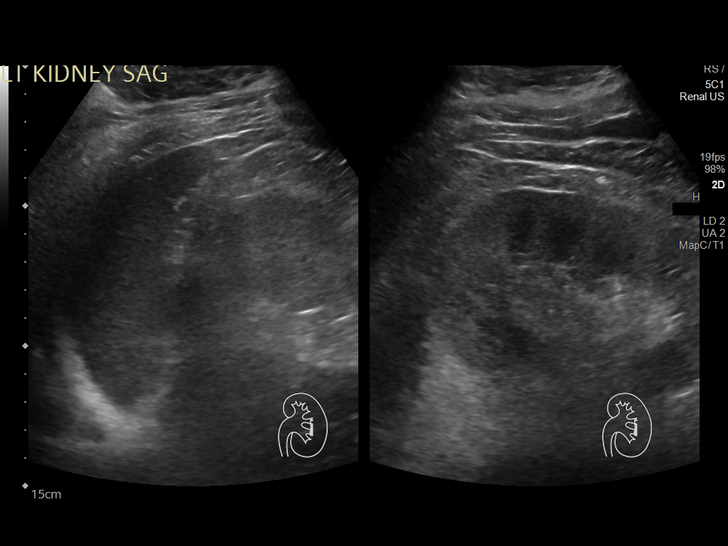
[im 24/39]
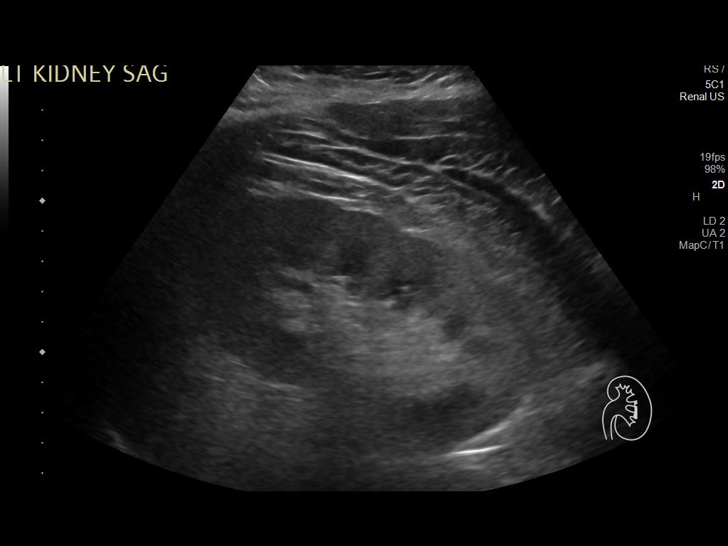
[im 26/39]
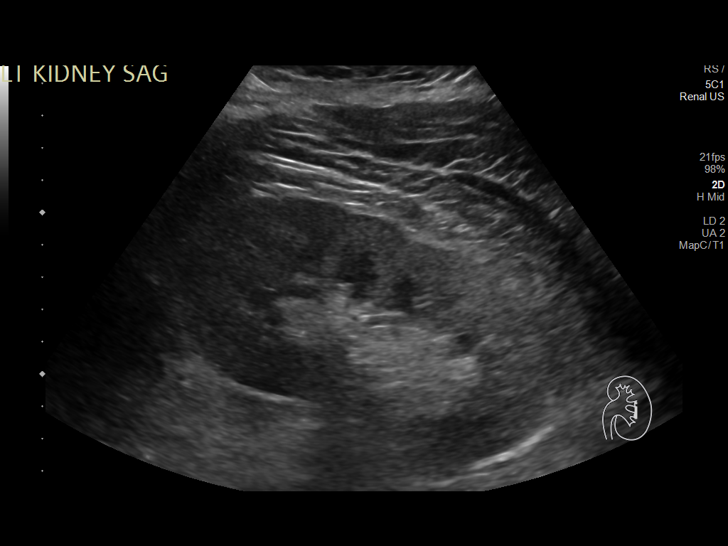
[im 29/39]
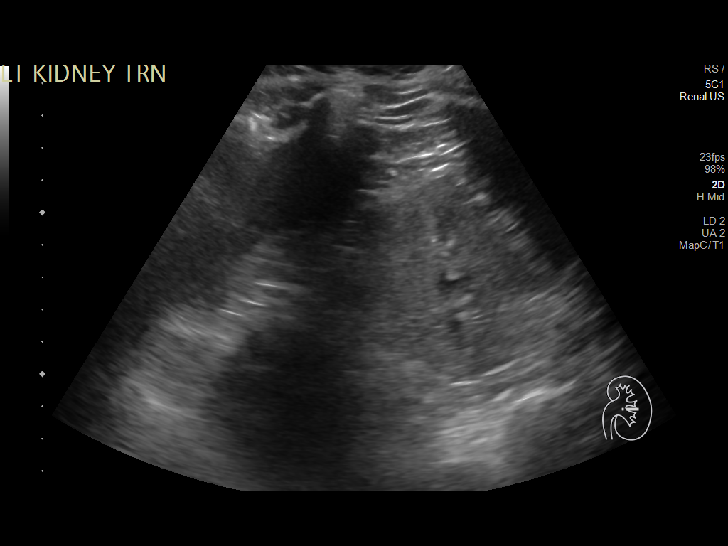
[im 32/39]
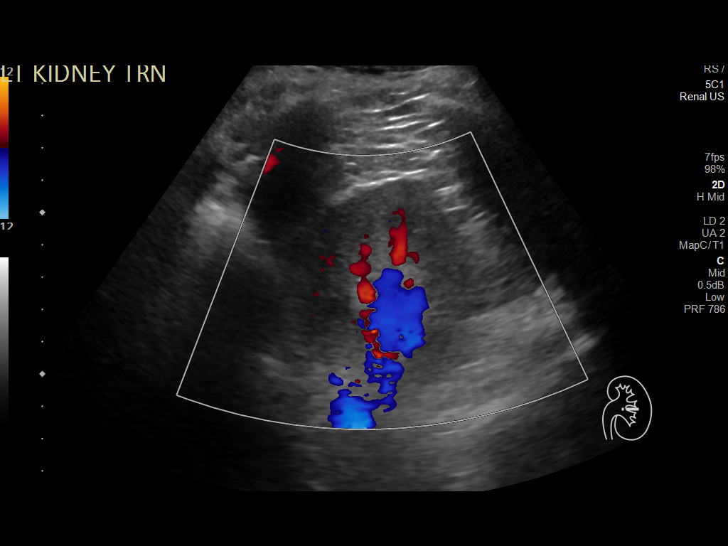
[im 35/39]
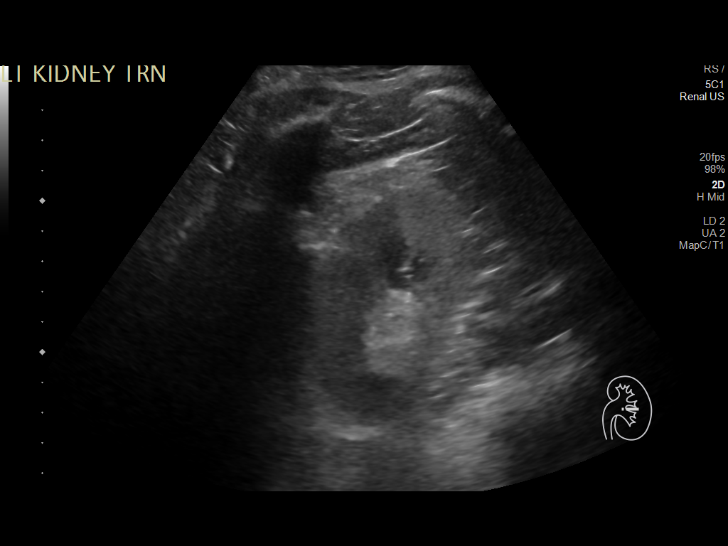
[im 39/39]
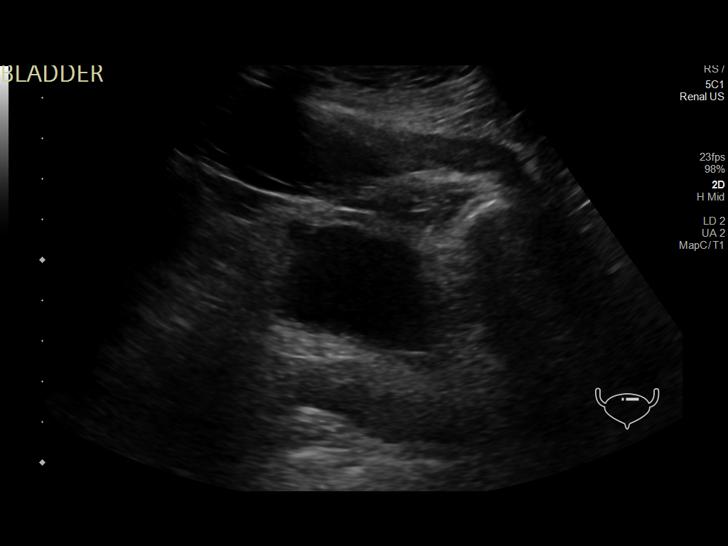

[14 of 25 positions shown; findings below may reference images not displayed]

FINDINGS: Right Kidney:

Renal measurements: 11.2 x 5.8 x 6.0 cm = volume: 202.1 mL.
Increased echogenicity consistent with chronic renal disease. No
mass or hydronephrosis visualized.

Left Kidney:

Renal measurements: 12.7 x 6.5 x 5.8 cm = volume: 248.6 mL.
Increased echogenicity consistent chronic renal disease. No mass or
hydronephrosis visualized.

Bladder:

Appears normal for degree of bladder distention.

Other:

None.
IMPRESSION: 1.  Increased echogenicity consistent chronic medical renal disease.

2.  No acute abnormality.  No hydronephrosis.

## 2020-06-16 IMAGING — US US ABDOMEN LIMITED
1 series · 14 of 25 positions shown · non-contrast
Comparison: [DATE]

CLINICAL DATA: Hyperbilirubinemia

EXAM:
ULTRASOUND ABDOMEN LIMITED RIGHT UPPER QUADRANT

[Series 1: us abdomen limited ruq · 14 of 49 slices shown]
[im 1/49]
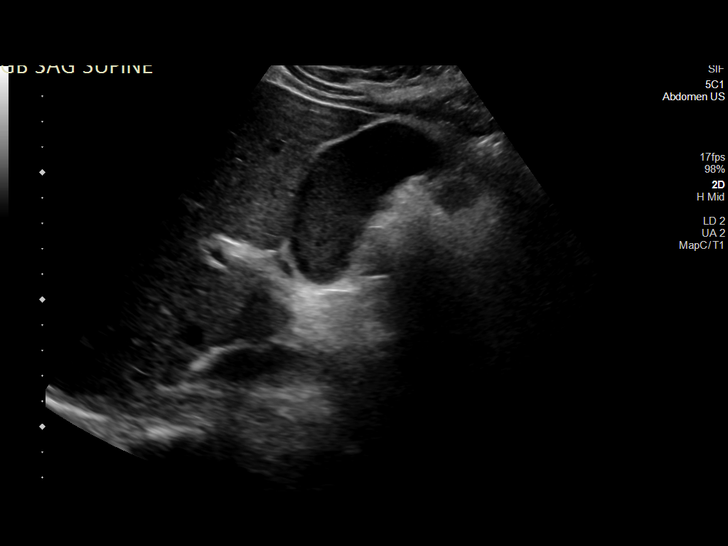
[im 5/49]
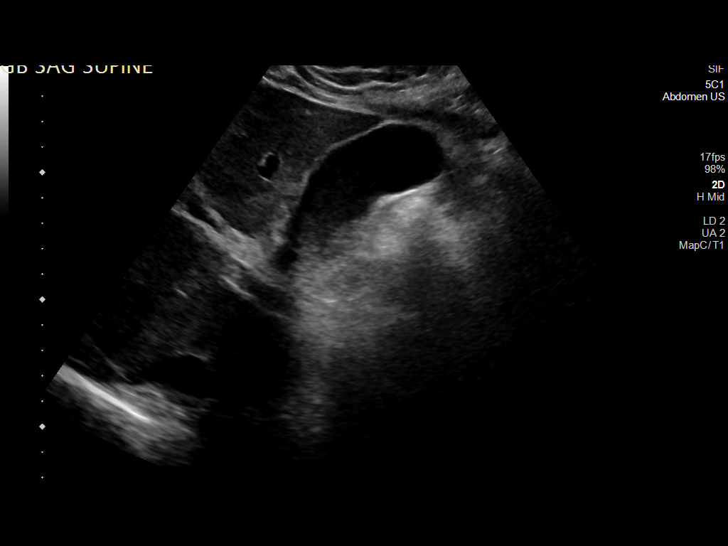
[im 9/49]
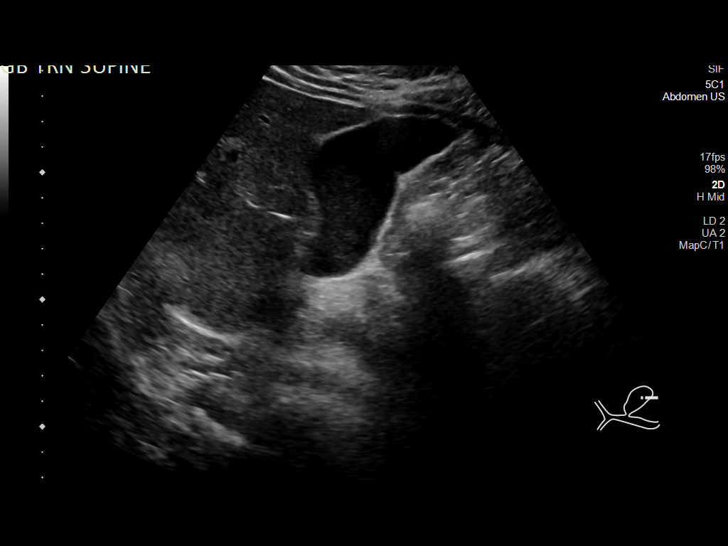
[im 13/49]
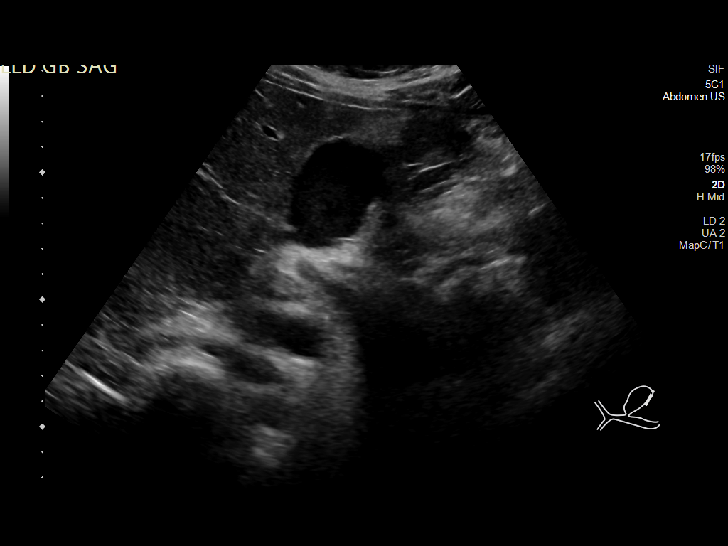
[im 17/49]
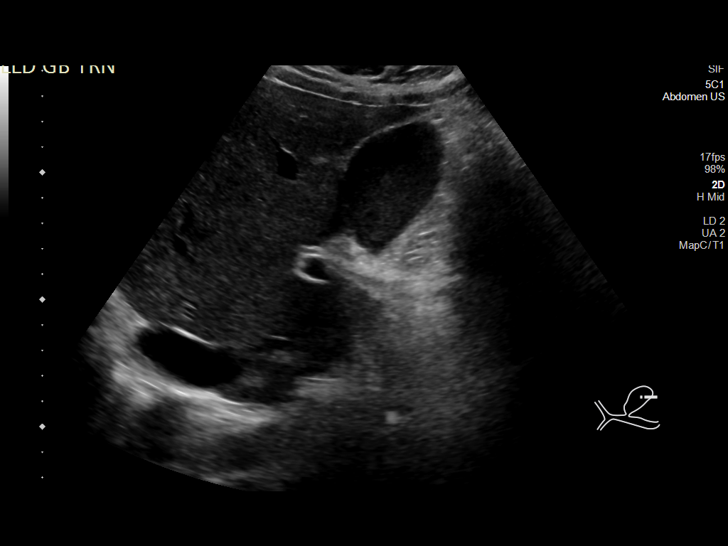
[im 19/49]
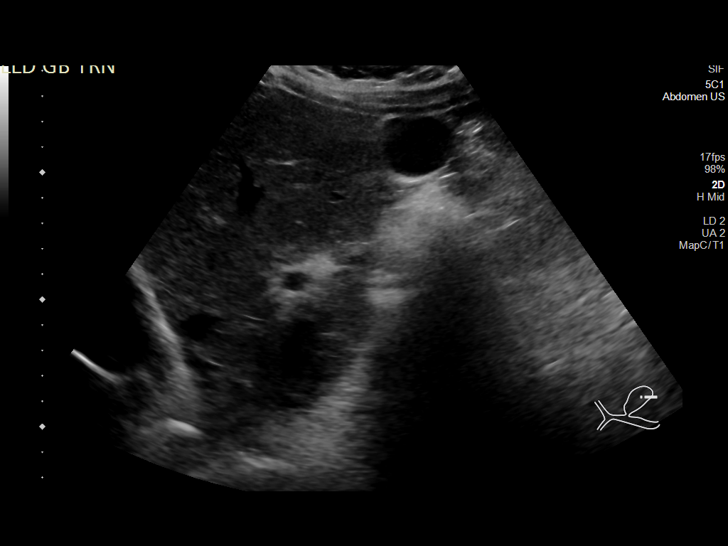
[im 23/49]
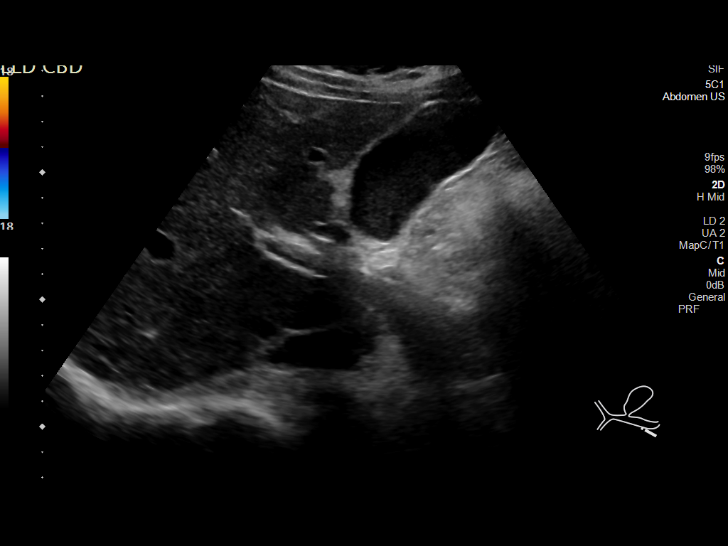
[im 27/49]
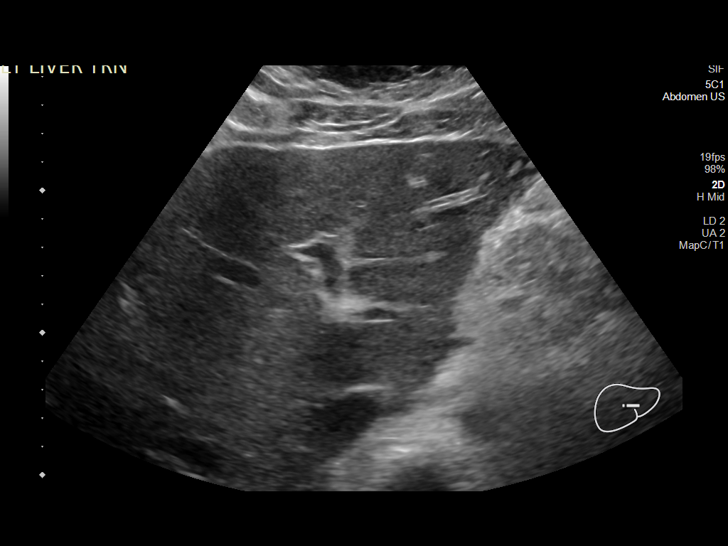
[im 31/49]
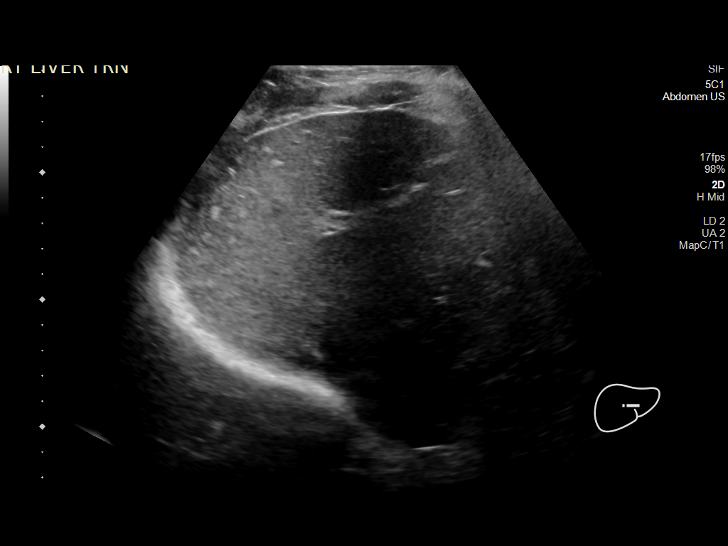
[im 33/49]
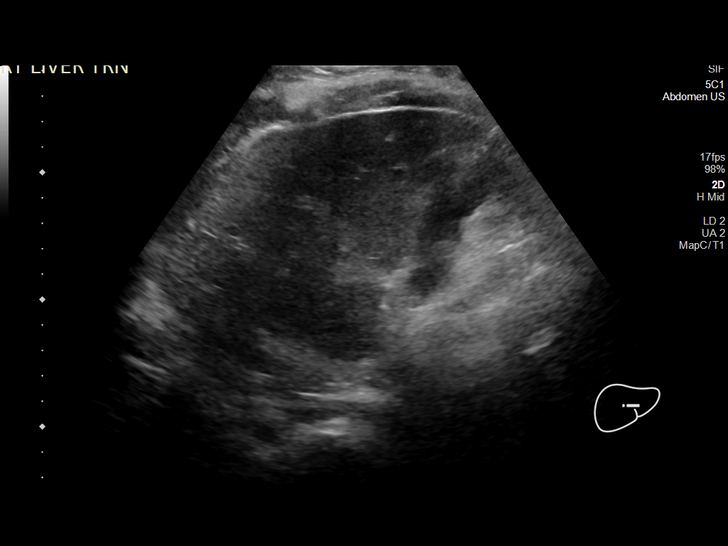
[im 37/49]
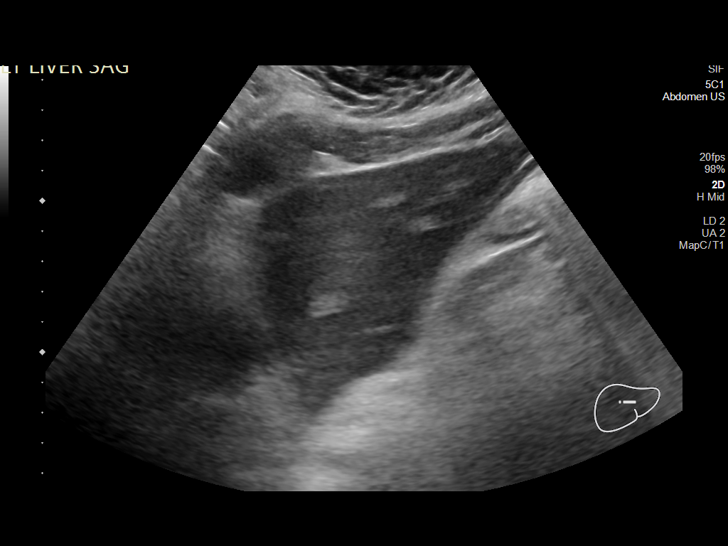
[im 41/49]
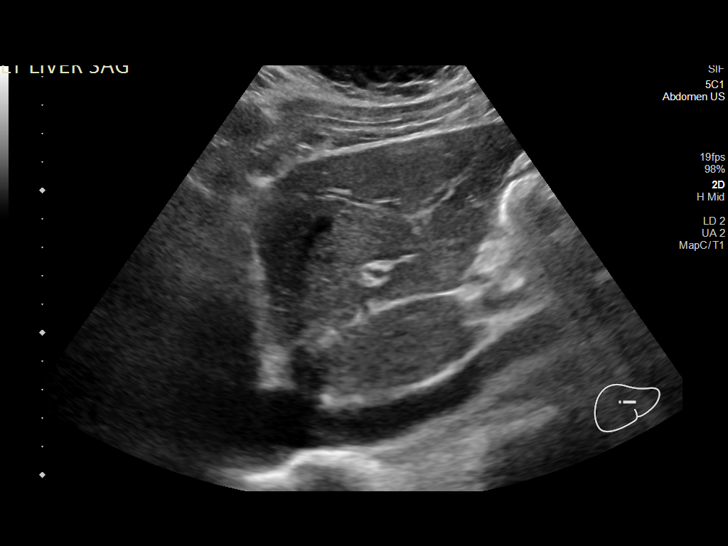
[im 45/49]
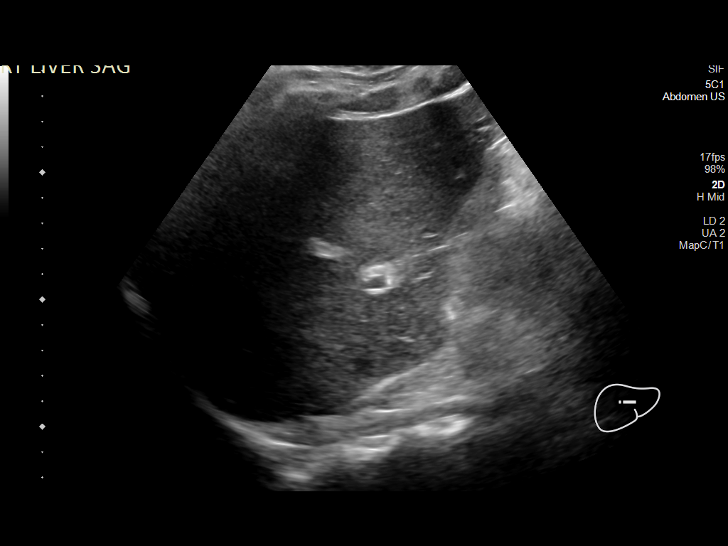
[im 49/49]
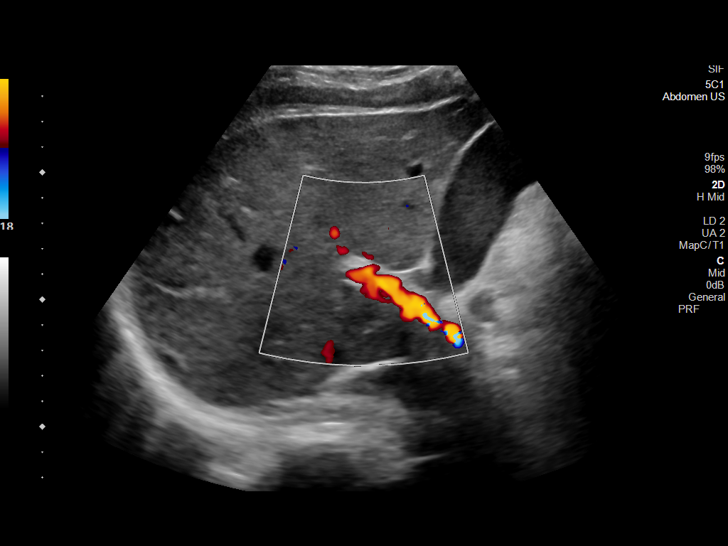

[14 of 25 positions shown; findings below may reference images not displayed]

FINDINGS: Gallbladder:

Increased echoes in the gallbladder consistent with sludge. No
stones or wall thickening. Murphy's sign was negative.

Common bile duct:

Diameter: 5 mm, normal

Liver:

Increased parenchymal echotexture suggesting fatty infiltration. No
focal lesions. Portal vein is patent on color Doppler imaging with
normal direction of blood flow towards the liver.

Other: None.
IMPRESSION: Gallbladder sludge suggested. No cholelithiasis or cholecystitis.
Probable fatty infiltration of the liver.

## 2020-06-16 MED ORDER — ONDANSETRON HCL 4 MG PO TABS: 4.0000 mg | ORAL_TABLET | Freq: Four times a day (QID) | ORAL | Status: DC | PRN

## 2020-06-16 MED ORDER — ALUM & MAG HYDROXIDE-SIMETH 200-200-20 MG/5ML PO SUSP: 15.0000 mL | ORAL | Status: DC | PRN

## 2020-06-16 MED ORDER — ONDANSETRON HCL 4 MG/2ML IJ SOLN: 4.0000 mg | Freq: Four times a day (QID) | INTRAMUSCULAR | Status: DC | PRN

## 2020-06-16 MED ORDER — CARVEDILOL 12.5 MG PO TABS: 12.5000 mg | ORAL_TABLET | Freq: Two times a day (BID) | ORAL | Status: DC

## 2020-06-16 MED ORDER — ACETAMINOPHEN 650 MG RE SUPP: 650.0000 mg | Freq: Four times a day (QID) | RECTAL | Status: DC | PRN

## 2020-06-16 MED ORDER — ONDANSETRON HCL 4 MG/2ML IJ SOLN
4.0000 mg | Freq: Four times a day (QID) | INTRAMUSCULAR | Status: DC
  Administered 2020-06-16 – 2020-06-20 (×15): 4 mg via INTRAVENOUS
  Filled 2020-06-16 (×15): qty 2

## 2020-06-16 MED ORDER — LACTATED RINGERS IV SOLN: INTRAVENOUS | Status: AC

## 2020-06-16 MED ORDER — ACETAMINOPHEN 325 MG PO TABS: 650.0000 mg | ORAL_TABLET | Freq: Four times a day (QID) | ORAL | Status: DC | PRN

## 2020-06-16 MED ORDER — METOCLOPRAMIDE HCL 5 MG/ML IJ SOLN: 10.0000 mg | Freq: Four times a day (QID) | INTRAMUSCULAR | Status: DC | PRN

## 2020-06-16 MED ORDER — METOCLOPRAMIDE HCL 5 MG/ML IJ SOLN
10.0000 mg | Freq: Once | INTRAMUSCULAR | Status: AC
  Administered 2020-06-16: 10 mg via INTRAVENOUS
  Filled 2020-06-16: qty 2

## 2020-06-16 MED ORDER — PANTOPRAZOLE SODIUM 40 MG IV SOLR
40.0000 mg | Freq: Two times a day (BID) | INTRAVENOUS | Status: DC
  Administered 2020-06-16 – 2020-06-20 (×7): 40 mg via INTRAVENOUS
  Filled 2020-06-16 (×7): qty 40

## 2020-06-16 MED ORDER — ALLOPURINOL 100 MG PO TABS
200.0000 mg | ORAL_TABLET | Freq: Every day | ORAL | Status: DC
  Administered 2020-06-16 – 2020-06-21 (×5): 200 mg via ORAL
  Filled 2020-06-16 (×5): qty 2

## 2020-06-16 MED ORDER — APIXABAN 5 MG PO TABS
5.0000 mg | ORAL_TABLET | Freq: Two times a day (BID) | ORAL | Status: DC
  Administered 2020-06-16 – 2020-06-21 (×11): 5 mg via ORAL
  Filled 2020-06-16 (×12): qty 1

## 2020-06-16 MED ORDER — POLYETHYLENE GLYCOL 3350 17 G PO PACK
17.0000 g | PACK | Freq: Every day | ORAL | Status: DC | PRN
  Filled 2020-06-16: qty 1

## 2020-06-16 MED ORDER — ONDANSETRON HCL 4 MG PO TABS: 4.0000 mg | ORAL_TABLET | Freq: Four times a day (QID) | ORAL | Status: DC

## 2020-06-16 NOTE — Progress Notes (Signed)
Patient ID: Bradley Powell, male   DOB: 1981-10-11, 39 y.o.   MRN: [id number] Patient admitted early this morning for persistent nausea vomiting along with abnormal labs with creatinine of 4.53.  Patient seen and examined at bedside and plan of care discussed with him.  I have reviewed patient's medical records including this morning's H&P, vitals, medications and most current labs myself.  He is currently on IV fluids.  Blood pressure still on the lower side intermittently.  DC Coreg for now.  Repeat stat labs this morning and in a.m.  Strict input and output.  Daily weights.  Will switch antiemetics to around-the-clock for the next 24 hours.  Add intravenous Protonix along with as needed antacid.  Spoke with Dr. Ula Lingo who will see the patient in consultation.  Follow recommendations.  Repeat a.m. labs.

## 2020-06-16 NOTE — ED Notes (Signed)
Pt transported to CT ?

## 2020-06-16 NOTE — Consult Note (Signed)
[facility] Consultation Note  Referring Provider: [facility] Primary Care Physician:  [name], MD Primary Gastroenterologist:  Dr. Levora Angel  Reason for Consultation:  N/v, elevated liver enzymes  HPI: Bradley Powell is a 39 y.o. male admitted nausea vomiting and abnormal labs:  Elevated creatinine and bilirubin.  Multiple medical problems, including heart failure (etiology?), cirrhosis by imaging.  Has had intermittent nausea/vomiting for past one year, 4 bad attacks, each last several hours to a couple days.  In between, he is asymptomatic.  Denies abdominal pain or blood in stool.  Has had work-up for these symptoms with Dr. Levora Angel, and was scheduled for EGD at hospital in a couple weeks.   Past Medical History:  Diagnosis Date  . Atrial flutter (HCC)    a. s/p ablation 03/2018.  Marland Kitchen Chronic combined systolic and diastolic CHF (congestive heart failure) (HCC)   . History of esophagogastroduodenoscopy (EGD)   . Morbid obesity (HCC)   . NICM (nonischemic cardiomyopathy) (HCC)   . PVC's (premature ventricular contractions)   . Sleep apnea     Past Surgical History:  Procedure Laterality Date  . A-FLUTTER ABLATION N/A 04/06/2018   Procedure: A-FLUTTER ABLATION;  Surgeon: Marinus Maw, MD;  Location: [facility] INVASIVE CV LAB;  Service: Cardiovascular;  Laterality: N/A;  . NO PAST SURGERIES    . RIGHT HEART CATH N/A 12/20/2019   Procedure: RIGHT HEART CATH;  Surgeon: Dolores Patty, MD;  Location: [facility] INVASIVE CV LAB;  Service: Cardiovascular;  Laterality: N/A;  . RIGHT/LEFT HEART CATH AND CORONARY ANGIOGRAPHY N/A 03/01/2017   Procedure: Right/Left Heart Cath and Coronary Angiography;  Surgeon: Dolores Patty, MD;  Location: [facility] INVASIVE CV LAB;  Service: Cardiovascular;  Laterality: N/A;    Prior to Admission medications   Medication Sig Start Date End Date Taking? Authorizing Provider  acetaminophen (TYLENOL) 500 MG tablet Take 1,000 mg by mouth  every 6 (six) hours as needed for moderate pain or headache.    Yes [provider]  allopurinol (ZYLOPRIM) 100 MG tablet Take 2 tablets (200 mg total) by mouth daily. Please contact PCP for further refills 04/10/19  Yes Bensimhon, Bevelyn Buckles, MD  apixaban (ELIQUIS) 5 MG TABS tablet Take 1 tablet (5 mg total) by mouth 2 (two) times daily. 04/20/20  Yes Bensimhon, Bevelyn Buckles, MD  carvedilol (COREG) 12.5 MG tablet TAKE 1 TABLET (12.5 MG TOTAL) BY MOUTH 2 (TWO) TIMES DAILY WITH A MEAL. 03/11/20  Yes Bensimhon, Bevelyn Buckles, MD  empagliflozin (JARDIANCE) 10 MG TABS tablet Take 10 mg by mouth daily before breakfast. 12/18/19  Yes Bensimhon, Bevelyn Buckles, MD  losartan (COZAAR) 100 MG tablet Take 1 tablet (100 mg total) by mouth daily. 05/13/20  Yes Bensimhon, Bevelyn Buckles, MD  MITIGARE 0.6 MG CAPS Take 0.6 mg by mouth daily as needed (gout flare). 04/10/19  Yes Bensimhon, Bevelyn Buckles, MD  ondansetron (ZOFRAN) 4 MG tablet Take 4 mg by mouth every 8 (eight) hours as needed. 02/17/20  Yes [provider]  spironolactone (ALDACTONE) 25 MG tablet TAKE 1 TABLET BY MOUTH EVERYDAY AT BEDTIME Patient taking differently: Take 25 mg by mouth daily.  02/10/20  Yes Bensimhon, Bevelyn Buckles, MD  torsemide (DEMADEX) 20 MG tablet Take 1 tablet (20 mg total) by mouth as needed. 06/03/20  Yes Bensimhon, Bevelyn Buckles, MD    Current Facility-Administered Medications  Medication Dose Route Frequency Provider Last Rate Last Admin  . acetaminophen (TYLENOL) tablet 650 mg  650 mg Oral Q6H PRN Shauna Hugh  J, MD       Or  . acetaminophen (TYLENOL) suppository 650 mg  650 mg Rectal Q6H PRN Shalhoub, Deno Lunger, MD      . allopurinol (ZYLOPRIM) tablet 200 mg  200 mg Oral Daily Shalhoub, Deno Lunger, MD   200 mg at 06/16/20 1045  . apixaban (ELIQUIS) tablet 5 mg  5 mg Oral BID Marinda Elk, MD   5 mg at 06/16/20 1044  . lactated ringers infusion   Intravenous Continuous Glade Lloyd, MD 100 mL/hr at 06/16/20 1046 New Bag at 06/16/20 1046  .  metoCLOPramide (REGLAN) injection 10 mg  10 mg Intravenous Q6H PRN Shalhoub, Deno Lunger, MD      . ondansetron [facility]) injection 4 mg  4 mg Intravenous Q6H Glade Lloyd, MD   4 mg at 06/16/20 0950  . polyethylene glycol (MIRALAX / GLYCOLAX) packet 17 g  17 g Oral Daily PRN Shalhoub, Deno Lunger, MD       Current Outpatient Medications  Medication Sig Dispense Refill  . acetaminophen (TYLENOL) 500 MG tablet Take 1,000 mg by mouth every 6 (six) hours as needed for moderate pain or headache.     . allopurinol (ZYLOPRIM) 100 MG tablet Take 2 tablets (200 mg total) by mouth daily. Please contact PCP for further refills 60 tablet 0  . apixaban (ELIQUIS) 5 MG TABS tablet Take 1 tablet (5 mg total) by mouth 2 (two) times daily. 60 tablet 6  . carvedilol (COREG) 12.5 MG tablet TAKE 1 TABLET (12.5 MG TOTAL) BY MOUTH 2 (TWO) TIMES DAILY WITH A MEAL. 180 tablet 1  . empagliflozin (JARDIANCE) 10 MG TABS tablet Take 10 mg by mouth daily before breakfast. 30 tablet 11  . losartan (COZAAR) 100 MG tablet Take 1 tablet (100 mg total) by mouth daily. 90 tablet 1  . MITIGARE 0.6 MG CAPS Take 0.6 mg by mouth daily as needed (gout flare). 30 capsule 5  . ondansetron (ZOFRAN) 4 MG tablet Take 4 mg by mouth every 8 (eight) hours as needed.    Marland Kitchen spironolactone (ALDACTONE) 25 MG tablet TAKE 1 TABLET BY MOUTH EVERYDAY AT BEDTIME (Patient taking differently: Take 25 mg by mouth daily. ) 90 tablet 3  . torsemide (DEMADEX) 20 MG tablet Take 1 tablet (20 mg total) by mouth as needed. 30 tablet 2    Allergies as of 06/15/2020 - Review Complete 06/15/2020  Allergen Reaction Noted  . Entresto [sacubitril-valsartan] Nausea And Vomiting and Other (See Comments) 04/02/2018    Family History  Problem Relation Age of Onset  . CVA Mother        pacemaker  . Multiple sclerosis Mother   . Seizures Mother   . Hypertension Father   . Gout Father     Social History   Socioeconomic History  . Marital status: Single    Spouse  name: Not on file  . Number of children: Not on file  . Years of education: Not on file  . Highest education level: Not on file  Occupational History  . Not on file  Tobacco Use  . Smoking status: Never Smoker  . Smokeless tobacco: Never Used  Vaping Use  . Vaping Use: Never used  Substance and Sexual Activity  . Alcohol use: Yes    Comment: rarely  . Drug use: No  . Sexual activity: Not on file  Other Topics Concern  . Not on file  Social History Narrative  . Not on file   Social Determinants of  Health   Financial Resource Strain:   . Difficulty of Paying Living Expenses:   Food Insecurity:   . Worried About Programme researcher, broadcasting/film/video in the Last Year:   . Barista in the Last Year:   Transportation Needs:   . Freight forwarder (Medical):   Marland Kitchen Lack of Transportation (Non-Medical):   Physical Activity:   . Days of Exercise per Week:   . Minutes of Exercise per Session:   Stress:   . Feeling of Stress :   Social Connections:   . Frequency of Communication with Friends and Family:   . Frequency of Social Gatherings with Friends and Family:   . Attends Religious Services:   . Active Member of Clubs or Organizations:   . Attends Banker Meetings:   Marland Kitchen Marital Status:   Intimate Partner Violence:   . Fear of Current or Ex-Partner:   . Emotionally Abused:   Marland Kitchen Physically Abused:   . Sexually Abused:     Review of Systems: As per HPI, all others negative  Physical Exam: Vital signs in last 24 hours: Temp:  [97.7 F (36.5 C)] 97.7 F (36.5 C) (08/02 1825) Pulse Rate:  [65-79] 65 (08/03 1047) Resp:  [8-22] 15 (08/03 1047) BP: (62-118)/(36-75) 118/75 (08/03 1047) SpO2:  [95 %-100 %] 97 % (08/03 1047) Weight:  [116.1 kg] 116.1 kg (08/03 0817)   General:   Alert, obese, Well-developed, well-nourished, pleasant and cooperative in NAD Head:  Normocephalic and atraumatic. Eyes:  Sclera icterus.   Conjunctiva pink. Ears:  Normal auditory acuity. Nose:   No deformity, discharge,  or lesions. Mouth:  No deformity or lesions.  Oropharynx pink & moist. Neck:  Supple; no masses or thyromegaly. Abdomen:  Soft, protuberant, nontender and nondistended. No masses, hepatosplenomegaly or hernias noted. Normal bowel sounds, without guarding, and without rebound.     Msk:  Symmetrical without gross deformities. Normal posture. Pulses:  Normal pulses noted. Extremities:  Without clubbing or edema. Neurologic:  Alert and  oriented x4;  grossly normal neurologically. Skin:  Intact without significant lesions or rashes. Psych:  Alert and cooperative. Normal mood and affect.   Lab Results: Recent Labs    06/15/20 1834  WBC 10.0  HGB 18.1*  HCT 55.3*  PLT 240   BMET Recent Labs    06/15/20 1834  NA 132*  K 4.2  CL 92*  CO2 25  GLUCOSE 130*  BUN 45*  CREATININE 4.53*  CALCIUM 9.9   LFT Recent Labs    06/15/20 1834  PROT 7.8  ALBUMIN 4.0  AST 32  ALT 34  ALKPHOS 86  BILITOT 6.9*   PT/INR No results for input(s): LABPROT, INR in the last 72 hours.  Studies/Results: DG Abdomen 1 View  Result Date: 06/15/2020 CLINICAL DATA:  39 year old male with shortness of breath. EXAM: ABDOMEN - 1 VIEW COMPARISON:  Radiograph dated 02/14/2019. FINDINGS: There is no bowel dilatation or evidence of obstruction. No free air or radiopaque calculi. The osseous structures and soft tissues are grossly unremarkable. IMPRESSION: Negative. Electronically Signed   By: Elgie Collard M.D.   On: 06/15/2020 20:26   US RENAL  Result Date: 06/16/2020 CLINICAL DATA:  Acute renal failure. EXAM: RENAL / URINARY TRACT ULTRASOUND COMPLETE COMPARISON:  MRI 03/13/2020. FINDINGS: Right Kidney: Renal measurements: 11.2 x 5.8 x 6.0 cm = volume: 202.1 mL. Increased echogenicity consistent with chronic renal disease. No mass or hydronephrosis visualized. Left Kidney: Renal measurements: 12.7 x 6.5 x  5.8 cm = volume: 248.6 mL. Increased echogenicity consistent chronic renal  disease. No mass or hydronephrosis visualized. Bladder: Appears normal for degree of bladder distention. Other: None. IMPRESSION: 1.  Increased echogenicity consistent chronic medical renal disease. 2.  No acute abnormality.  No hydronephrosis. Electronically Signed   By: Maisie Fus  Register   On: 06/16/2020 08:46   DG Chest Port 1 View  Result Date: 06/15/2020 CLINICAL DATA:  Shortness of breath and dehydration. EXAM: PORTABLE CHEST 1 VIEW COMPARISON:  February 25, 2017 FINDINGS: There is no evidence of acute infiltrate, pleural effusion or pneumothorax. The heart size and mediastinal contours are within normal limits. The visualized skeletal structures are unremarkable. IMPRESSION: No active disease. Electronically Signed   By: Aram Candela M.D.   On: 06/15/2020 20:25   US Abdomen Limited RUQ  Result Date: 06/16/2020 CLINICAL DATA:  Hyperbilirubinemia EXAM: ULTRASOUND ABDOMEN LIMITED RIGHT UPPER QUADRANT COMPARISON:  02/18/2019 FINDINGS: Gallbladder: Increased echoes in the gallbladder consistent with sludge. No stones or wall thickening. Murphy's sign was negative. Common bile duct: Diameter: 5 mm, normal Liver: Increased parenchymal echotexture suggesting fatty infiltration. No focal lesions. Portal vein is patent on color Doppler imaging with normal direction of blood flow towards the liver. Other: None. IMPRESSION: Gallbladder sludge suggested. No cholelithiasis or cholecystitis. Probable fatty infiltration of the liver. Electronically Signed   By: Burman Nieves M.D.   On: 06/16/2020 01:14    Impression:  1.  Nausea, vomiting, intermittent x 1+ years. 2.  Cirrhosis (cardiac related, superimposed on liver cause?). 3.  Isolated hyperbilirubinemia.  No biliary obstruction on ultrasound. 4.  Elevated creatinine.  Plan:  1.  Medical management N/V with IVF, antiemetics, follow labs. 2.  May require endoscopy this admission, pending clinical course. 3.  Fractionate bilirubin. 4.  [facility] will  follow.   LOS: 1 day   Tesha Archambeau M  06/16/2020, 10:50 AM  Cell [phone] If no answer or after 5 PM call [phone]

## 2020-06-16 NOTE — ED Notes (Signed)
X3 failed attempt at lab collections lab to made aware for collections

## 2020-06-16 NOTE — Progress Notes (Signed)
Set pt up on CPAP tolerating well.

## 2020-06-16 NOTE — ED Notes (Signed)
Lab collection unsuccessful after multiple attempts by this RN and Tyrone Sage, Charity fundraiser. Lab called this RN received no answer. Lab collection pending phlebotomy.

## 2020-06-16 NOTE — ED Notes (Signed)
Hospitalist at bedside 

## 2020-06-16 NOTE — H&P (Addendum)
History and Physical    Bradley Powell ZOX:[id number] DOB: 06/08/1981 DOA: 06/15/2020  PCP: [name], MD  Patient coming from: Home   Chief Complaint:  Chief Complaint  Patient presents with  . Abnormal Lab     HPI:    39 year old male with past medical history of obstructive sleep apnea, systolic and diastolic congestive heart failure  (last echo 12/2019 EF 40-45%), nonischemic cardiomyopathy with recent finding of cardiac amyloidosis on MRI, morbid obesity, hypertension, hyperlipidemia, atrial fibrillation and flutter (S/P Ablation 03/2018) as well as recent diagnosis of hepatic cirrhosis (02/2020) who presents to [facility] emergency department with complaints of nausea and vomiting.  Patient explains that over the past several months he has been having lengthy bouts of nausea vomiting and difficulty tolerating oral intake.  For this particular episode, approximately 2 weeks ago the patient began to develop nausea and vomiting.  Symptoms have been severe frequent bouts of nonbilious nonbloody vomiting daily.  Patient has had difficulty tolerating oral intake with both solids and liquids over the span of time.  Patient denies any associated abdominal pain, diarrhea, recent ingestion of undercooked food, dysuria or low back pain.  As the patient's symptoms have persisted over the past 2 weeks patient has developed associated generalized weakness and bouts of intense lightheadedness.  Patient denies recent travel, sick contacts or confirmed contact with COVID-19 infection.  Patient eventually presented to [facility] emergency department for evaluation.  Upon evaluation in the emergency department patient was found to be extremely hypotensive initially with blood pressures as low 65/36.  Blood pressures have gradually increased throughout the hospital stay with several boluses of intravenous isotonic fluids.  Is also been found to be septic from acute kidney injury  with creatinine 4.53.  Hospitalist group has now been called to assess patient for admission the hospital.     Review of Systems:   Review of Systems  Constitutional: Positive for malaise/fatigue.  Gastrointestinal: Positive for nausea and vomiting.  Neurological: Positive for dizziness and weakness.  All other systems reviewed and are negative.    Past Medical History:  Diagnosis Date  . Atrial flutter (HCC)    a. s/p ablation 03/2018.  Marland Kitchen Chronic combined systolic and diastolic CHF (congestive heart failure) (HCC)   . History of esophagogastroduodenoscopy (EGD)   . Morbid obesity (HCC)   . NICM (nonischemic cardiomyopathy) (HCC)   . PVC's (premature ventricular contractions)   . Sleep apnea     Past Surgical History:  Procedure Laterality Date  . A-FLUTTER ABLATION N/A 04/06/2018   Procedure: A-FLUTTER ABLATION;  Surgeon: Marinus Maw, MD;  Location: [facility] INVASIVE CV LAB;  Service: Cardiovascular;  Laterality: N/A;  . NO PAST SURGERIES    . RIGHT HEART CATH N/A 12/20/2019   Procedure: RIGHT HEART CATH;  Surgeon: Dolores Patty, MD;  Location: [facility] INVASIVE CV LAB;  Service: Cardiovascular;  Laterality: N/A;  . RIGHT/LEFT HEART CATH AND CORONARY ANGIOGRAPHY N/A 03/01/2017   Procedure: Right/Left Heart Cath and Coronary Angiography;  Surgeon: Dolores Patty, MD;  Location: [facility] INVASIVE CV LAB;  Service: Cardiovascular;  Laterality: N/A;     reports that he has never smoked. He has never used smokeless tobacco. He reports current alcohol use. He reports that he does not use drugs.  Allergies  Allergen Reactions  . Entresto [Sacubitril-Valsartan] Nausea And Vomiting and Other (See Comments)    Lightheaded, fainting    Family History  Problem Relation Age of Onset  . CVA  Mother        pacemaker  . Multiple sclerosis Mother   . Seizures Mother   . Hypertension Father   . Gout Father      Prior to Admission medications   Medication Sig Start Date End Date Taking?  Authorizing Provider  acetaminophen (TYLENOL) 500 MG tablet Take 1,000 mg by mouth every 6 (six) hours as needed for moderate pain or headache.    Yes [provider]  allopurinol (ZYLOPRIM) 100 MG tablet Take 2 tablets (200 mg total) by mouth daily. Please contact PCP for further refills 04/10/19  Yes Bensimhon, Bevelyn Buckles, MD  apixaban (ELIQUIS) 5 MG TABS tablet Take 1 tablet (5 mg total) by mouth 2 (two) times daily. 04/20/20  Yes Bensimhon, Bevelyn Buckles, MD  carvedilol (COREG) 12.5 MG tablet TAKE 1 TABLET (12.5 MG TOTAL) BY MOUTH 2 (TWO) TIMES DAILY WITH A MEAL. 03/11/20  Yes Bensimhon, Bevelyn Buckles, MD  empagliflozin (JARDIANCE) 10 MG TABS tablet Take 10 mg by mouth daily before breakfast. 12/18/19  Yes Bensimhon, Bevelyn Buckles, MD  losartan (COZAAR) 100 MG tablet Take 1 tablet (100 mg total) by mouth daily. 05/13/20  Yes Bensimhon, Bevelyn Buckles, MD  MITIGARE 0.6 MG CAPS Take 0.6 mg by mouth daily as needed (gout flare). 04/10/19  Yes Bensimhon, Bevelyn Buckles, MD  ondansetron (ZOFRAN) 4 MG tablet Take 4 mg by mouth every 8 (eight) hours as needed. 02/17/20  Yes [provider]  spironolactone (ALDACTONE) 25 MG tablet TAKE 1 TABLET BY MOUTH EVERYDAY AT BEDTIME Patient taking differently: Take 25 mg by mouth daily.  02/10/20  Yes Bensimhon, Bevelyn Buckles, MD  torsemide (DEMADEX) 20 MG tablet Take 1 tablet (20 mg total) by mouth as needed. 06/03/20  Yes Bensimhon, Bevelyn Buckles, MD    Physical Exam: Vitals:   06/15/20 2320 06/16/20 0015 06/16/20 0020 06/16/20 0036  BP: (!) 88/59 (!) 94/53 100/61 (!) 92/47  Pulse: 70 74 72 67  Resp: 16 14 16 15   Temp:      TempSrc:      SpO2: 100% 99% 97% 97%    Constitutional: Acute alert and oriented x3, patient is in distress due to nausea and vomiting.  Patient is obese. Skin: no rashes, no lesions, poor skin turgor noted. Eyes: Pupils are equally reactive to light.  Notable scleral icterus without conjunctival pallor. ENMT: Dry mucous membranes noted.  Posterior pharynx clear  of any exudate or lesions.   Neck: normal, supple, no masses, no thyromegaly.  No evidence of jugular venous distension.   Respiratory: Notably diminished breath sounds at the bases bilaterally.  Clear to auscultation bilaterally, no wheezing, no crackles. Normal respiratory effort. No accessory muscle use.  Cardiovascular: Irregularly irregular rhythm with controlled rate.  No murmurs / rubs / gallops. No extremity edema. 2+ pedal pulses. No carotid bruits.  Chest:   Nontender without crepitus or deformity.   Back:   Nontender without crepitus or deformity. Abdomen: Abdomen is protuberant but soft and nontender.  No evidence of intra-abdominal masses.  Positive bowel sounds noted in all quadrants.   Musculoskeletal: No joint deformity upper and lower extremities. Good ROM, no contractures. Normal muscle tone.  Neurologic: CN 2-12 grossly intact. Sensation intact, strength noted to be 5 out of 5 in all 4 extremities.  Patient is following all commands.  Patient is responsive to verbal stimuli.   Psychiatric: Patient presents as a normal mood with appropriate affect.  Patient seems to possess insight as to theircurrent situation.  Labs on Admission: I have personally reviewed following labs and imaging studies -   CBC: Recent Labs  Lab 06/15/20 1834  WBC 10.0  HGB 18.1*  HCT 55.3*  MCV 89.5  PLT 240   Basic Metabolic Panel: Recent Labs  Lab 06/15/20 1834  NA 132*  K 4.2  CL 92*  CO2 25  GLUCOSE 130*  BUN 45*  CREATININE 4.53*  CALCIUM 9.9   GFR: CrCl cannot be calculated (Unknown ideal weight.). Liver Function Tests: Recent Labs  Lab 06/15/20 1834  AST 32  ALT 34  ALKPHOS 86  BILITOT 6.9*  PROT 7.8  ALBUMIN 4.0   No results for input(s): LIPASE, AMYLASE in the last 168 hours. No results for input(s): AMMONIA in the last 168 hours. Coagulation Profile: No results for input(s): INR, PROTIME in the last 168 hours. Cardiac Enzymes: No results for input(s):  CKTOTAL, CKMB, CKMBINDEX, TROPONINI in the last 168 hours. BNP (last 3 results) No results for input(s): PROBNP in the last 8760 hours. HbA1C: No results for input(s): HGBA1C in the last 72 hours. CBG: Recent Labs  Lab 06/15/20 1858  GLUCAP 127*   Lipid Profile: No results for input(s): CHOL, HDL, LDLCALC, TRIG, CHOLHDL, LDLDIRECT in the last 72 hours. Thyroid Function Tests: No results for input(s): TSH, T4TOTAL, FREET4, T3FREE, THYROIDAB in the last 72 hours. Anemia Panel: No results for input(s): VITAMINB12, FOLATE, FERRITIN, TIBC, IRON, RETICCTPCT in the last 72 hours. Urine analysis:    Component Value Date/Time   COLORURINE AMBER (A) 02/19/2017 1917   APPEARANCEUR HAZY (A) 02/19/2017 1917   LABSPEC 1.022 02/19/2017 1917   PHURINE 5.0 02/19/2017 1917   GLUCOSEU NEGATIVE 02/19/2017 1917   HGBUR NEGATIVE 02/19/2017 1917   BILIRUBINUR NEGATIVE 02/19/2017 1917   KETONESUR NEGATIVE 02/19/2017 1917   PROTEINUR >=300 (A) 02/19/2017 1917   NITRITE NEGATIVE 02/19/2017 1917   LEUKOCYTESUR NEGATIVE 02/19/2017 1917    Radiological Exams on Admission - Personally Reviewed: DG Abdomen 1 View  Result Date: 06/15/2020 CLINICAL DATA:  39 year old male with shortness of breath. EXAM: ABDOMEN - 1 VIEW COMPARISON:  Radiograph dated 02/14/2019. FINDINGS: There is no bowel dilatation or evidence of obstruction. No free air or radiopaque calculi. The osseous structures and soft tissues are grossly unremarkable. IMPRESSION: Negative. Electronically Signed   By: Elgie Collard M.D.   On: 06/15/2020 20:26   DG Chest Port 1 View  Result Date: 06/15/2020 CLINICAL DATA:  Shortness of breath and dehydration. EXAM: PORTABLE CHEST 1 VIEW COMPARISON:  February 25, 2017 FINDINGS: There is no evidence of acute infiltrate, pleural effusion or pneumothorax. The heart size and mediastinal contours are within normal limits. The visualized skeletal structures are unremarkable. IMPRESSION: No active disease.  Electronically Signed   By: Aram Candela M.D.   On: 06/15/2020 20:25    EKG: Personally reviewed.  Rhythm is atrial fibrillation with heart rate of heart rate of 77 bpm.  Notable poor R wave progression throughout the precordial leads.    Assessment/Plan Principal Problem:   Acute kidney injury with possible acute tubular necrosis (HCC)   Patient suffering from substantial acute kidney injury secondary to profound volume depletion and hypotension  Hydrating patient with intravenous isotonic fluids  Strict input and output monitoring  Avoiding nephrotoxic agents bitemporally with holding diuretics and ARB.  Monitoring renal function and electrolytes with serial chemistries.  If renal function worsens or fails to improve will expand kidney injury work-up with urine electrolytes and renal ultrasound.  Active Problems:  Intractable nausea and vomiting   Patient has been complaining of several lengthy bouts of protracted nausea and vomiting with minimal ability to tolerate oral intake.  This is at least the third or fourth episode in the past several months.  Multiple possible etiologies including cyclical vomiting syndrome or gastroparesis.  Patient denies marijuana use.  Providing patient with as needed antiemetics.  Attempting as needed Zofran with as needed metoclopramide to be provided if Zofran is ineffective.  Will attempt to place patient on clear liquid diet and advance as tolerated.  X-ray of the abdomen reveals no evidence of bowel obstruction.  Urinalysis has been ordered and is pending to ensure patient is not suffering from UTI.  Patient is also suffering from concurrent substantial hyperbilirubinemia for which right upper ultrasound is being performed -patient has recent diagnosis of hepatic cirrhosis in 02/2020 and therefore this is likely the culprit.  Considering significant hyperbilirubinemia and intractable nausea vomiting of undetermined etiology will  obtain gastroenterology consultation in the mo     Dehydration with hyponatremia   Mild hyponatremia with clinical evidence of significant dehydration  Hydrating patient with intravenous isotonic fluids  Monitoring sodium levels with serial chemistries  Patient may also have a component of chronic hyponatremia due to cirrhosis as well.    Hypotension due to hypovolemia   Patient presented to the emergency department with profound hypotension with blood pressures in the 60s  Blood pressures are slowly improving with intravenous isotonic fluids  Monitoring blood pressures closely, we will temporarily admit patient to stepdown unit until patient is more hemodynamically stable.  No need for vasopressors at this time as patient still volume depleted.    Paroxysmal atrial flutter (HCC)   Patient seems to be exhibiting bouts of controlled atrial fibrillation and flutter on telemetry  Patient is currently rate controlled  Monitoring patient on telemetry  Continuing home regimen of Coreg as long as blood pressure tolerates    Chronic combined systolic and diastolic congestive heart failure (HCC)   No evidence of cardiogenic volume overload at this time as patient is profoundly hypotensive  Will monitor for any evidence of volume overload as we volume resuscitate the patient.  Temporarily holding diuretics.  It is worth noting that cardiac MRI recently done several days ago is consistent with cardiac amyloidosis.    Patient is already receiving a regimen of diuretics and beta-blockers and is following as an outpatient with cardiology.  Continue outpatient follow-up.  I am uncertain as to how this may be related to patient's diagnosis of cirrhosis however.    Hepatic cirrhosis (HCC)   Recent diagnosis of cirrhosis 02/2020 based on MRCP that can be reviewed in epic.  Patient is currently undergoing work-up by [facility], etiology has yet to be found, patient is to undergo  outpatient endoscopy in the neck several weeks  Considering significant hyperbilirubinemia and intractable nausea vomiting of undetermined etiology will obtain gastroenterology consultation in the morning.    Gout  Continue home regimen of allopurinol   Code Status:  Full code Family Communication: deferred   Status is: Inpatient  Remains inpatient appropriate because:Ongoing diagnostic testing needed not appropriate for outpatient work up, IV treatments appropriate due to intensity of illness or inability to take PO and Inpatient level of care appropriate due to severity of illness   Dispo: The patient is from: Home              Anticipated d/c is to: Home  Anticipated d/c date is: > 3 days              Patient currently is not medically stable to d/c.        Marinda Elk MD [facility] Pager [phone]  If 7PM-7AM, please contact night-coverage [email or web address] Use universal [place] password for that web site. If you do not have the password, please call the hospital operator.  06/16/2020, 1:11 AM

## 2020-06-17 LAB — CBC WITH DIFFERENTIAL/PLATELET
Abs Immature Granulocytes: 0.02 10*3/uL (ref 0.00–0.07)
Basophils Absolute: 0.1 10*3/uL (ref 0.0–0.1)
Basophils Relative: 1 %
Eosinophils Absolute: 0.1 10*3/uL (ref 0.0–0.5)
Eosinophils Relative: 2 %
HCT: 55.2 % — ABNORMAL HIGH (ref 39.0–52.0)
Hemoglobin: 17.9 g/dL — ABNORMAL HIGH (ref 13.0–17.0)
Immature Granulocytes: 0 %
Lymphocytes Relative: 27 %
Lymphs Abs: 2 10*3/uL (ref 0.7–4.0)
MCH: 29.5 pg (ref 26.0–34.0)
MCHC: 32.4 g/dL (ref 30.0–36.0)
MCV: 91.1 fL (ref 80.0–100.0)
Monocytes Absolute: 1 10*3/uL (ref 0.1–1.0)
Monocytes Relative: 13 %
Neutro Abs: 4.3 10*3/uL (ref 1.7–7.7)
Neutrophils Relative %: 57 %
Platelets: 211 10*3/uL (ref 150–400)
RBC: 6.06 MIL/uL — ABNORMAL HIGH (ref 4.22–5.81)
RDW: 11.8 % (ref 11.5–15.5)
WBC: 7.5 10*3/uL (ref 4.0–10.5)
nRBC: 0 % (ref 0.0–0.2)

## 2020-06-17 LAB — COMPREHENSIVE METABOLIC PANEL
ALT: 44 U/L (ref 0–44)
AST: 42 U/L — ABNORMAL HIGH (ref 15–41)
Albumin: 4 g/dL (ref 3.5–5.0)
Alkaline Phosphatase: 82 U/L (ref 38–126)
Anion gap: 15 (ref 5–15)
BUN: 26 mg/dL — ABNORMAL HIGH (ref 6–20)
CO2: 24 mmol/L (ref 22–32)
Calcium: 9.9 mg/dL (ref 8.9–10.3)
Chloride: 96 mmol/L — ABNORMAL LOW (ref 98–111)
Creatinine, Ser: 2.39 mg/dL — ABNORMAL HIGH (ref 0.61–1.24)
GFR calc Af Amer: 39 mL/min — ABNORMAL LOW (ref >60)
GFR calc non Af Amer: 34 mL/min — ABNORMAL LOW (ref >60)
Glucose, Bld: 76 mg/dL (ref 70–99)
Potassium: 4.9 mmol/L (ref 3.5–5.1)
Sodium: 135 mmol/L (ref 135–145)
Total Bilirubin: 6.5 mg/dL — ABNORMAL HIGH (ref 0.3–1.2)
Total Protein: 7.8 g/dL (ref 6.5–8.1)

## 2020-06-17 LAB — MAGNESIUM: Magnesium: 2.2 mg/dL (ref 1.7–2.4)

## 2020-06-17 LAB — URINE CULTURE: Culture: <10000 — AB

## 2020-06-17 NOTE — Progress Notes (Signed)
PROGRESS NOTE    Bradley Powell  DTO:[id number] DOB: [birth year] DOA: 06/15/2020 PCP: [name], MD   Brief Narrative:  39 year old male with past medical history of obstructive sleep apnea, systolic and diastolic congestive heart failure  (last echo 12/2019 EF 40-45%), nonischemic cardiomyopathy with recent finding of cardiac amyloidosis on MRI, morbid obesity, hypertension, hyperlipidemia, atrial fibrillation and flutter (S/P Ablation 03/2018) as well as recent diagnosis of hepatic cirrhosis (02/2020) who presents to [facility] emergency department with complaints of nausea and vomiting. Patient explains that over the past several months he has been having lengthy bouts of nausea vomiting and difficulty tolerating oral intake. For this particular episode, approximately 2 weeks ago the patient began to develop nausea and vomiting.  Symptoms have been severe frequent bouts of nonbilious nonbloody vomiting daily.  Patient has had difficulty tolerating oral intake with both solids and liquids over the span of time.  Patient denies any associated abdominal pain, diarrhea, recent ingestion of undercooked food, dysuria or low back pain. As the patient's symptoms have persisted over the past 2 weeks patient has developed associated generalized weakness and bouts of intense lightheadedness.  Patient denies recent travel, sick contacts or confirmed contact with COVID-19 infection. Patient eventually presented to [facility] emergency department for evaluation.  Upon evaluation in the emergency department patient was found to be extremely hypotensive initially with blood pressures as low 65/36.  Blood pressures have gradually increased throughout the hospital stay with several boluses of intravenous isotonic fluids.  Is also been found to be septic from acute kidney injury with creatinine 4.53.  Hospitalist group has now been called to assess patient for admission the  hospital.    Assessment & Plan:   Principal Problem:   Acute kidney injury [facility]) Active Problems:   Gout   Paroxysmal atrial flutter (HCC)   Chronic combined systolic and diastolic congestive heart failure (HCC)   Hepatic cirrhosis (HCC)   Dehydration with hyponatremia   Hypotension due to hypovolemia   Intractable nausea and vomiting  Acute kidney injury with possible acute tubular necrosis in the setting of profound dehydration (HCC) - Secondary to below intractable nausea and vomiting -Continue IV fluids, follow I's and O's, creatinine downtrending appropriately which is reassuring.   - Avoiding nephrotoxic agents bitemporally with holding diuretics and ARB. Lab Results  Component Value Date   CREATININE 2.39 (H) 06/17/2020   CREATININE 2.77 (H) 06/16/2020   CREATININE 4.53 (H) 06/15/2020    Intractable nausea and vomiting POA, unclear etiology -Reports multiple acute episodes, previously resolving -Certainly could be gastroparesis versus infectious etiology although less likely -marijuana hyperemesis syndrome but appears similar but patient's THC at admission was negative -Continue Zofran, metoclopramide and continue to advance to clear liquid diet as tolerated  -Imaging thus far unremarkable -GI following, tentative plan for upper endoscopy in the next 24 to 48 hours per their schedule -Lipase minimally elevated, unlikely pancreatitis -Ongoing hyperbilirubinemia -right upper quadrant ultrasound shows fatty liver and sludging otherwise unremarkable  Hypovolemic hyponatremia secondary to above -Likely in the setting of intractable nausea vomiting given profound AKI likely dehydration as well. -Patient may also have a component of chronic hyponatremia due to cirrhosis as well but unlikely primary cause.  Symptomatic hypotension in the setting of above, POA, resolving -Patient does not meet criteria for shock given resolution with IV fluids -Need to hold home  antihypertensive medications until indicated  Paroxysmal atrial flutter (HCC), rate controlled - Patient seems to be exhibiting bouts of controlled  atrial fibrillation and flutter on telemetry - Patient is currently rate controlled - Monitoring patient on telemetry -Currently unable to tolerate home medications given hypotension, resume Coreg once blood pressure improves  Chronic combined systolic and diastolic congestive heart failure (HCC) -Off diuretics and core measures in the setting of hypotension and dehydration as above - Cardiac MRI recently done several days ago is consistent with cardiac amyloidosis.   Patient is already receiving a regimen of diuretics and beta-blockers and is following as an outpatient with cardiology.  Continue outpatient follow-up. I am uncertain as to how this may be related to patient's diagnosis of cirrhosis however.  Hepatic cirrhosis/fatty liver (HCC) Recent diagnosis of cirrhosis 02/2020 based on MRCP that can be reviewed in epic. Patient is currently undergoing work-up by [facility], etiology has yet to be found, patient is to undergo outpatient endoscopy on the 17th of this month Considering significant hyperbilirubinemia and intractable nausea vomiting of undetermined etiology will obtain gastroenterology consultation in the morning.  Gout Continue home regimen of allopurinol   DVT prophylaxis: Eliquis Code Status: Full Family Communication: None present  Status is: Inpatient  Dispo: The patient is from: Home              Anticipated d/c is to: To be determined              Anticipated d/c date is: 48 to 72 hours              Patient currently not medically stable for discharge given the need for ongoing medical work-up, evaluation, imaging, upper endoscopy and lab work.  Patient also continues to be on IV fluids in the setting of poor p.o. intake due to intractable nausea vomiting.  Consultants:   GI  Procedures:   Possible upper  endoscopy  Antimicrobials:  Not indicated  Subjective: No acute issues or events overnight, small episode of emesis early this morning controlled with Zofran but otherwise feels markedly improved from admission still fatigued and weak but denies chest pain, shortness of breath, headache, fevers, chills.  Objective: Vitals:   06/16/20 2016 06/16/20 2100 06/17/20 0346 06/17/20 0347  BP: (!) 100/56   (!) 90/53  Pulse:      Resp:  18  20  Temp:   98.3 F (36.8 C)   TempSrc:   Oral   SpO2:      Weight:      Height:        Intake/Output Summary (Last 24 hours) at 06/17/2020 0728 Last data filed at 06/16/2020 1700 Gross per 24 hour  Intake 1754.97 ml  Output 2 ml  Net 1752.97 ml   Filed Weights   06/16/20 0817  Weight: 116.1 kg    Examination:  General:  Pleasantly resting in bed, No acute distress. HEENT:  Normocephalic atraumatic.  Sclerae nonicteric, noninjected.  Extraocular movements intact bilaterally. Neck:  Without mass or deformity.  Trachea is midline. Lungs:  Clear to auscultate bilaterally without rhonchi, wheeze, or rales. Heart:  Regular rate and rhythm.  Without murmurs, rubs, or gallops. Abdomen:  Soft, nontender, nondistended.  Without guarding or rebound. Extremities: Without cyanosis, clubbing, edema, or obvious deformity. Vascular:  Dorsalis pedis and posterior tibial pulses palpable bilaterally. Skin:  Warm and dry, no erythema, no ulcerations.   Data Reviewed: I have personally reviewed following labs and imaging studies  CBC: Recent Labs  Lab 06/15/20 1834 06/16/20 1522 06/17/20 0150  WBC 10.0 8.0 7.5  NEUTROABS  --  5.2 4.3  HGB  18.1* 18.3* 17.9*  HCT 55.3* 57.1* 55.2*  MCV 89.5 91.7 91.1  PLT 240 190 211   Basic Metabolic Panel: Recent Labs  Lab 06/15/20 1834 06/16/20 1522 06/17/20 0150  NA 132* 137 135  K 4.2 5.0 4.9  CL 92* 98 96*  CO2 25 20* 24  GLUCOSE 130* 86 76  BUN 45* 38* 26*  CREATININE 4.53* 2.77* 2.39*  CALCIUM 9.9  9.9 9.9  MG  --   --  2.2   GFR: Estimated Creatinine Clearance: 52.9 mL/min (A) (by C-G formula based on SCr of 2.39 mg/dL (H)). Liver Function Tests: Recent Labs  Lab 06/15/20 1834 06/16/20 1522 06/17/20 0150  AST 32 39 42*  ALT 34 38 44  ALKPHOS 86 90 82  BILITOT 6.9* 5.5* 6.5*  PROT 7.8 7.2 7.8  ALBUMIN 4.0 3.8 4.0   Recent Labs  Lab 06/16/20 1522  LIPASE 69*   No results for input(s): AMMONIA in the last 168 hours. Coagulation Profile: Recent Labs  Lab 06/16/20 1843  INR 1.3*   Cardiac Enzymes: No results for input(s): CKTOTAL, CKMB, CKMBINDEX, TROPONINI in the last 168 hours. BNP (last 3 results) No results for input(s): PROBNP in the last 8760 hours. HbA1C: Recent Labs    06/16/20 1522  HGBA1C 4.7*   CBG: Recent Labs  Lab 06/15/20 1858  GLUCAP 127*   Lipid Profile: No results for input(s): CHOL, HDL, LDLCALC, TRIG, CHOLHDL, LDLDIRECT in the last 72 hours. Thyroid Function Tests: No results for input(s): TSH, T4TOTAL, FREET4, T3FREE, THYROIDAB in the last 72 hours. Anemia Panel: No results for input(s): VITAMINB12, FOLATE, FERRITIN, TIBC, IRON, RETICCTPCT in the last 72 hours. Sepsis Labs: Recent Labs  Lab 06/15/20 1954  LATICACIDVEN 1.8    Recent Results (from the past 240 hour(s))  Culture, blood (routine x 2)     Status: None (Preliminary result)   Collection Time: 06/15/20  7:54 PM   Specimen: BLOOD  Result Value Ref Range Status   Specimen Description BLOOD SITE NOT SPECIFIED  Final   Special Requests   Final    BOTTLES DRAWN AEROBIC AND ANAEROBIC Blood Culture adequate volume   Culture   Final    NO GROWTH < 24 HOURS Performed at [facility], [address], [place], [place] [address]    Report Status PENDING  Incomplete  Culture, blood (routine x 2)     Status: None (Preliminary result)   Collection Time: 06/15/20  8:56 PM   Specimen: BLOOD  Result Value Ref Range Status   Specimen Description BLOOD SITE NOT SPECIFIED   Final   Special Requests   Final    BOTTLES DRAWN AEROBIC AND ANAEROBIC Blood Culture adequate volume   Culture   Final    NO GROWTH < 24 HOURS Performed at [facility], [address] [place], [place] [address]    Report Status PENDING  Incomplete  SARS Coronavirus 2 by RT PCR (hospital order, performed in [facility] hospital lab) Nasopharyngeal Nasopharyngeal Swab     Status: None   Collection Time: 06/15/20  9:03 PM   Specimen: Nasopharyngeal Swab  Result Value Ref Range Status   SARS Coronavirus 2 NEGATIVE NEGATIVE Final    Comment: (NOTE) SARS-CoV-2 target nucleic acids are NOT DETECTED.  The SARS-CoV-2 RNA is generally detectable in upper and lower respiratory specimens during the acute phase of infection. The lowest concentration of SARS-CoV-2 viral copies this assay can detect is 250 copies / mL. A negative  result does not preclude SARS-CoV-2 infection and should not be used as the sole basis for treatment or other patient management decisions.  A negative result may occur with improper specimen collection / handling, submission of specimen other than nasopharyngeal swab, presence of viral mutation(s) within the areas targeted by this assay, and inadequate number of viral copies (<250 copies / mL). A negative result must be combined with clinical observations, patient history, and epidemiological information.  Fact Sheet for Patients:   BoilerBrush.com.cy  Fact Sheet for Healthcare Providers: https://pope.com/  This test is not yet approved or  cleared by the [place] FDA and has been authorized for detection and/or diagnosis of SARS-CoV-2 by FDA under an Emergency Use Authorization (EUA).  This EUA will remain in effect (meaning this test can be used) for the duration of the COVID-19 declaration under Section 564(b)(1) of the Act, 21 U.S.C. section 360bbb-3(b)(1), unless the authorization is terminated  or revoked sooner.  Performed at [facility], [address], [place], [place] [address]          Radiology Studies: DG Abdomen 1 View  Result Date: 06/15/2020 CLINICAL DATA:  39 year old male with shortness of breath. EXAM: ABDOMEN - 1 VIEW COMPARISON:  Radiograph dated 02/14/2019. FINDINGS: There is no bowel dilatation or evidence of obstruction. No free air or radiopaque calculi. The osseous structures and soft tissues are grossly unremarkable. IMPRESSION: Negative. Electronically Signed   By: Elgie Collard M.D.   On: 06/15/2020 20:26   US RENAL  Result Date: 06/16/2020 CLINICAL DATA:  Acute renal failure. EXAM: RENAL / URINARY TRACT ULTRASOUND COMPLETE COMPARISON:  MRI 03/13/2020. FINDINGS: Right Kidney: Renal measurements: 11.2 x 5.8 x 6.0 cm = volume: 202.1 mL. Increased echogenicity consistent with chronic renal disease. No mass or hydronephrosis visualized. Left Kidney: Renal measurements: 12.7 x 6.5 x 5.8 cm = volume: 248.6 mL. Increased echogenicity consistent chronic renal disease. No mass or hydronephrosis visualized. Bladder: Appears normal for degree of bladder distention. Other: None. IMPRESSION: 1.  Increased echogenicity consistent chronic medical renal disease. 2.  No acute abnormality.  No hydronephrosis. Electronically Signed   By: Maisie Fus  Register   On: 06/16/2020 08:46   DG Chest Port 1 View  Result Date: 06/15/2020 CLINICAL DATA:  Shortness of breath and dehydration. EXAM: PORTABLE CHEST 1 VIEW COMPARISON:  February 25, 2017 FINDINGS: There is no evidence of acute infiltrate, pleural effusion or pneumothorax. The heart size and mediastinal contours are within normal limits. The visualized skeletal structures are unremarkable. IMPRESSION: No active disease. Electronically Signed   By: Aram Candela M.D.   On: 06/15/2020 20:25   US Abdomen Limited RUQ  Result Date: 06/16/2020 CLINICAL DATA:  Hyperbilirubinemia EXAM: ULTRASOUND ABDOMEN LIMITED RIGHT UPPER  QUADRANT COMPARISON:  02/18/2019 FINDINGS: Gallbladder: Increased echoes in the gallbladder consistent with sludge. No stones or wall thickening. Murphy's sign was negative. Common bile duct: Diameter: 5 mm, normal Liver: Increased parenchymal echotexture suggesting fatty infiltration. No focal lesions. Portal vein is patent on color Doppler imaging with normal direction of blood flow towards the liver. Other: None. IMPRESSION: Gallbladder sludge suggested. No cholelithiasis or cholecystitis. Probable fatty infiltration of the liver. Electronically Signed   By: Burman Nieves M.D.   On: 06/16/2020 01:14        Scheduled Meds: . allopurinol  200 mg Oral Daily  . apixaban  5 mg Oral BID  . ondansetron (ZOFRAN) IV  4 mg Intravenous Q6H  . pantoprazole (PROTONIX) IV  40 mg Intravenous  Q12H   Continuous Infusions:   LOS: 2 days   Time spent:  Azucena Fallen, DO [facility]  If 7PM-7AM, please contact night-coverage [email or web address]  06/17/2020, 7:28 AM

## 2020-06-17 NOTE — Evaluation (Signed)
Physical Therapy Evaluation and discharge  Patient Details Name: Bradley Powell MRN: [id number] DOB: Jun 02, 1981 Today's Date: 06/17/2020   History of Present Illness  39 year old male with past medical history of obstructive sleep apnea, systolic and diastolic congestive heart failure  (last echo 12/2019 EF 40-45%), nonischemic cardiomyopathy with recent finding of cardiac amyloidosis on MRI, morbid obesity, hypertension, hyperlipidemia, atrial fibrillation and flutter (S/P Ablation 03/2018) as well as recent diagnosis of hepatic cirrhosis (02/2020) who presents to ED on 8/2 with complaints of nausea and vomiting.  Clinical Impression   Pt presents with [facility] strength, ROM, and mobility on PT eval. Pt's only issue is + orthostatic hypotension (see below). Pt with no mobility needs at this time from a PT standpoint, but PT encourages pt to have supervision when up and walking given + orthostatics. PT to sign off, please reconsult if needed.    06/17/20 1300  Orthostatic Sitting  BP- Sitting 121/55  Pulse- Sitting 63  Orthostatic Standing at 0 minutes  BP- Standing at 0 minutes (!) 89/66  Pulse- Standing at 0 minutes 70  Orthostatic Standing at 3 minutes  BP- Standing at 3 minutes (!) 87/63  Pulse- Standing at 3 minutes 78      Follow Up Recommendations No PT follow up    Equipment Recommendations  None recommended by PT    Recommendations for Other Services       Precautions / Restrictions Precautions Precautions: Other (comment) Precaution Comments: + orthostatic Restrictions Weight Bearing Restrictions: No      Mobility  Bed Mobility Overal bed mobility: Modified Independent             General bed mobility comments: increased time and effort  Transfers Overall transfer level: Modified independent               General transfer comment: increased time to rise and steady secondary to dizziness with positional changes, no physical  assist  Ambulation/Gait Ambulation/Gait assistance: Modified independent (Device/Increase time) Gait Distance (Feet): 50 Feet (PT limited pt to room distance due to orthostatic hypotension) Assistive device: None Gait Pattern/deviations: Step-through pattern;WFL(Within Functional Limits) Gait velocity: WFL   General Gait Details: WFL gait, pt being cautious secondary to orthostasis and lines/leads.  Stairs            Wheelchair Mobility    Modified Rankin (Stroke Patients Only)       Balance Overall balance assessment: Independent                                           Pertinent Vitals/Pain Pain Assessment: Faces Faces Pain Scale: Hurts a little bit Pain Location: abdomen Pain Descriptors / Indicators: Other (Comment) (nausea) Pain Intervention(s): Limited activity within patient's tolerance;Monitored during session    Home Living Family/patient expects to be discharged to:: Private residence Living Arrangements: Parent Available Help at Discharge: Family Type of Home: House Home Access: Stairs to enter   Secretary/administrator of Steps: 5 Home Layout: Two level Home Equipment: None      Prior Function Level of Independence: Independent         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 Dominance   Dominant Hand: Right    Extremity/Trunk Assessment   Upper Extremity Assessment Upper Extremity Assessment: Overall WFL for tasks assessed    Lower Extremity Assessment Lower Extremity Assessment: Overall  WFL for tasks assessed    Cervical / Trunk Assessment Cervical / Trunk Assessment: Normal  Communication   Communication: No difficulties  Cognition Arousal/Alertness: Awake/alert Behavior During Therapy: WFL for tasks assessed/performed Overall Cognitive Status: Within Functional Limits for tasks assessed                                        General Comments      Exercises      Assessment/Plan    PT Assessment Patent does not need any further PT services  PT Problem List         PT Treatment Interventions      PT Goals (Current goals can be found in the Care Plan section)  Acute Rehab PT Goals PT Goal Formulation: With patient Time For Goal Achievement: 06/17/20 Potential to Achieve Goals: Good    Frequency     Barriers to discharge        Co-evaluation               AM-PAC PT "6 Clicks" Mobility  Outcome Measure Help needed turning from your back to your side while in a flat bed without using bedrails?: None Help needed moving from lying on your back to sitting on the side of a flat bed without using bedrails?: None Help needed moving to and from a bed to a chair (including a wheelchair)?: None Help needed standing up from a chair using your arms (e.g., wheelchair or bedside chair)?: None Help needed to walk in hospital room?: None Help needed climbing 3-5 steps with a railing? : A Little 6 Click Score: 23    End of Session   Activity Tolerance: Patient tolerated treatment well Patient left: in bed;with call bell/phone within reach;with family/visitor present Nurse Communication: Mobility status PT Visit Diagnosis: Other abnormalities of gait and mobility (R26.89)    Time: 8546-2703 PT Time Calculation (min) (ACUTE ONLY): 21 min   Charges:   PT Evaluation $PT Eval Low Complexity: 1 Low          Lamica Mccart E, PT Acute Rehabilitation Services Pager [phone]  Office [phone]   Andra Heslin D Fynn Adel 06/17/2020, 1:37 PM

## 2020-06-17 NOTE — Progress Notes (Signed)
Subjective: Nausea improving. No abdominal pain.  Objective: Vital signs in last 24 hours: Temp:  [97.6 F (36.4 C)-99.1 F (37.3 C)] 97.8 F (36.6 C) (08/04 1107) Pulse Rate:  [57-70] 59 (08/04 1114) Resp:  [7-20] 15 (08/04 1114) BP: (83-103)/(47-77) 92/77 (08/04 1114) SpO2:  [97 %-100 %] 98 % (08/04 1114) Weight change:  Last BM Date:  (PTA)  PE: GEN:  Overweight, NAD  Lab Results: CBC    Component Value Date/Time   WBC 7.5 06/17/2020 0150   RBC 6.06 (H) 06/17/2020 0150   HGB 17.9 (H) 06/17/2020 0150   HGB 12.6 (L) 04/04/2018 1253   HCT 55.2 (H) 06/17/2020 0150   HCT 39.9 04/04/2018 1253   PLT 211 06/17/2020 0150   PLT 190 04/04/2018 1253   MCV 91.1 06/17/2020 0150   MCV 89 04/04/2018 1253   MCH 29.5 06/17/2020 0150   MCHC 32.4 06/17/2020 0150   RDW 11.8 06/17/2020 0150   RDW 14.6 04/04/2018 1253   LYMPHSABS 2.0 06/17/2020 0150   LYMPHSABS 1.9 04/04/2018 1253   MONOABS 1.0 06/17/2020 0150   EOSABS 0.1 06/17/2020 0150   EOSABS 0.0 04/04/2018 1253   BASOSABS 0.1 06/17/2020 0150   BASOSABS 0.0 04/04/2018 1253   CMP     Component Value Date/Time   NA 135 06/17/2020 0150   NA 138 04/04/2018 1253   K 4.9 06/17/2020 0150   CL 96 (L) 06/17/2020 0150   CO2 24 06/17/2020 0150   GLUCOSE 76 06/17/2020 0150   BUN 26 (H) 06/17/2020 0150   BUN 14 04/04/2018 1253   CREATININE 2.39 (H) 06/17/2020 0150   CALCIUM 9.9 06/17/2020 0150   PROT 7.8 06/17/2020 0150   ALBUMIN 4.0 06/17/2020 0150   AST 42 (H) 06/17/2020 0150   ALT 44 06/17/2020 0150   ALKPHOS 82 06/17/2020 0150   BILITOT 6.5 (H) 06/17/2020 0150   GFRNONAA 34 (L) 06/17/2020 0150   GFRAA 39 (L) 06/17/2020 0150   Assessment:  1.  Nausea, vomiting, intermittent x 1+ years. 2.  Cirrhosis (cardiac related, superimposed on liver cause?). 3.  Isolated hyperbilirubinemia.  No biliary obstruction on ultrasound. 4.  Elevated creatinine.  Plan:  1.  Symptoms improving and renal function improving; advance  diet, prn antiemetics. 2.  Plan on EGD in the next day or two, if renal function continues to improve. 3.  EGD will follow.   Bradley Powell 06/17/2020, 11:23 AM   Cell [phone] If no answer or after 5 PM call [phone]

## 2020-06-18 LAB — COMPREHENSIVE METABOLIC PANEL
ALT: 53 U/L — ABNORMAL HIGH (ref 0–44)
AST: 52 U/L — ABNORMAL HIGH (ref 15–41)
Albumin: 3.1 g/dL — ABNORMAL LOW (ref 3.5–5.0)
Alkaline Phosphatase: 85 U/L (ref 38–126)
Anion gap: 9 (ref 5–15)
BUN: 16 mg/dL (ref 6–20)
CO2: 27 mmol/L (ref 22–32)
Calcium: 9.2 mg/dL (ref 8.9–10.3)
Chloride: 100 mmol/L (ref 98–111)
Creatinine, Ser: 2.12 mg/dL — ABNORMAL HIGH (ref 0.61–1.24)
GFR calc Af Amer: 45 mL/min — ABNORMAL LOW (ref >60)
GFR calc non Af Amer: 39 mL/min — ABNORMAL LOW (ref >60)
Glucose, Bld: 89 mg/dL (ref 70–99)
Potassium: 4.6 mmol/L (ref 3.5–5.1)
Sodium: 136 mmol/L (ref 135–145)
Total Bilirubin: 3.5 mg/dL — ABNORMAL HIGH (ref 0.3–1.2)
Total Protein: 6.1 g/dL — ABNORMAL LOW (ref 6.5–8.1)

## 2020-06-18 LAB — CBC
HCT: 46.1 % (ref 39.0–52.0)
Hemoglobin: 15.2 g/dL (ref 13.0–17.0)
MCH: 29.6 pg (ref 26.0–34.0)
MCHC: 33 g/dL (ref 30.0–36.0)
MCV: 89.9 fL (ref 80.0–100.0)
Platelets: 176 10*3/uL (ref 150–400)
RBC: 5.13 MIL/uL (ref 4.22–5.81)
RDW: 11.5 % (ref 11.5–15.5)
WBC: 6.1 10*3/uL (ref 4.0–10.5)
nRBC: 0 % (ref 0.0–0.2)

## 2020-06-18 NOTE — H&P (View-Only) (Signed)
Subjective: °Tolerating diet. ° °Objective: °Vital signs in last 24 hours: °Temp:  [97.8 °F (36.6 °C)-98.3 °F (36.8 °C)] 97.8 °F (36.6 °C) (08/05 0815) °Pulse Rate:  [58-76] 62 (08/05 0815) °Resp:  [15-27] 22 (08/05 0815) °BP: (86-103)/(51-77) 99/56 (08/05 0003) °SpO2:  [98 %-99 %] 98 % (08/05 0815) °Weight change:  °Last BM Date: 06/17/20 ° °PE: °GEN:  Overweight ° °Lab Results: °CBC °   °Component Value Date/Time  ° WBC 6.1 06/18/2020 0552  ° RBC 5.13 06/18/2020 0552  ° HGB 15.2 06/18/2020 0552  ° HGB 12.6 (L) 04/04/2018 1253  ° HCT 46.1 06/18/2020 0552  ° HCT 39.9 04/04/2018 1253  ° PLT 176 06/18/2020 0552  ° PLT 190 04/04/2018 1253  ° MCV 89.9 06/18/2020 0552  ° MCV 89 04/04/2018 1253  ° MCH 29.6 06/18/2020 0552  ° MCHC 33.0 06/18/2020 0552  ° RDW 11.5 06/18/2020 0552  ° RDW 14.6 04/04/2018 1253  ° LYMPHSABS 2.0 06/17/2020 0150  ° LYMPHSABS 1.9 04/04/2018 1253  ° MONOABS 1.0 06/17/2020 0150  ° EOSABS 0.1 06/17/2020 0150  ° EOSABS 0.0 04/04/2018 1253  ° BASOSABS 0.1 06/17/2020 0150  ° BASOSABS 0.0 04/04/2018 1253  ° °CMP  °   °Component Value Date/Time  ° NA 136 06/18/2020 0552  ° NA 138 04/04/2018 1253  ° K 4.6 06/18/2020 0552  ° CL 100 06/18/2020 0552  ° CO2 27 06/18/2020 0552  ° GLUCOSE 89 06/18/2020 0552  ° BUN 16 06/18/2020 0552  ° BUN 14 04/04/2018 1253  ° CREATININE 2.12 (H) 06/18/2020 0552  ° CALCIUM 9.2 06/18/2020 0552  ° PROT 6.1 (L) 06/18/2020 0552  ° ALBUMIN 3.1 (L) 06/18/2020 0552  ° AST 52 (H) 06/18/2020 0552  ° ALT 53 (H) 06/18/2020 0552  ° ALKPHOS 85 06/18/2020 0552  ° BILITOT 3.5 (H) 06/18/2020 0552  ° GFRNONAA 39 (L) 06/18/2020 0552  ° GFRAA 45 (L) 06/18/2020 0552  ° ° °Assessment: ° °1.  Nausea, vomiting, intermittent x 1+ years. °2.  Cirrhosis (cardiac related, superimposed on liver cause?). °3.  Isolated hyperbilirubinemia.  No biliary obstruction on ultrasound. °4.  Elevated creatinine. ° °Plan: ° °1.  Parameters are improving; continue conservative management and we'll pursue endoscopy  tomorrow. °2.  [facility] will follow. ° ° °Bradley Powell M °06/18/2020, 9:17 AM ° ° °Cell [phone] °If no answer or after 5 PM call [phone] ° °

## 2020-06-18 NOTE — Plan of Care (Signed)

## 2020-06-18 NOTE — Progress Notes (Signed)
PROGRESS NOTE    Bradley Powell  YPP:[id number] DOB: May 19, 1981 DOA: 06/15/2020 PCP: [name], MD   Brief Narrative:  39 year old male with past medical history of obstructive sleep apnea, systolic and diastolic congestive heart failure  (last echo 12/2019 EF 40-45%), nonischemic cardiomyopathy with recent finding of cardiac amyloidosis on MRI, morbid obesity, hypertension, hyperlipidemia, atrial fibrillation and flutter (S/P Ablation 03/2018) as well as recent diagnosis of hepatic cirrhosis (02/2020) who presents to [facility] emergency department with complaints of nausea and vomiting. Patient explains that over the past several months he has been having lengthy bouts of nausea vomiting and difficulty tolerating oral intake. For this particular episode, approximately 2 weeks ago the patient began to develop nausea and vomiting.  Symptoms have been severe frequent bouts of nonbilious nonbloody vomiting daily.  Patient has had difficulty tolerating oral intake with both solids and liquids over the span of time.  Patient denies any associated abdominal pain, diarrhea, recent ingestion of undercooked food, dysuria or low back pain. As the patient's symptoms have persisted over the past 2 weeks patient has developed associated generalized weakness and bouts of intense lightheadedness.  Patient denies recent travel, sick contacts or confirmed contact with COVID-19 infection. Patient eventually presented to [facility] emergency department for evaluation.  Upon evaluation in the emergency department patient was found to be extremely hypotensive initially with blood pressures as low 65/36.  Blood pressures have gradually increased throughout the hospital stay with several boluses of intravenous isotonic fluids.  Is also been found to be septic from acute kidney injury with creatinine 4.53.  Hospitalist group has now been called to assess patient for admission the  hospital.    Assessment & Plan:   Principal Problem:   Acute kidney injury [facility]) Active Problems:   Gout   Paroxysmal atrial flutter (HCC)   Chronic combined systolic and diastolic congestive heart failure (HCC)   Hepatic cirrhosis (HCC)   Dehydration with hyponatremia   Hypotension due to hypovolemia   Intractable nausea and vomiting   Acute kidney injury with possible acute tubular necrosis in the setting of profound dehydration (HCC) - Secondary to below intractable nausea and vomiting -Continue IV fluids, follow I's and O's, creatinine downtrending appropriately which is reassuring.   - Avoiding nephrotoxic agents bitemporally with holding diuretics and ARB. Lab Results  Component Value Date   CREATININE 2.12 (H) 06/18/2020   CREATININE 2.39 (H) 06/17/2020   CREATININE 2.77 (H) 06/16/2020    Intractable nausea and vomiting POA, unclear etiology -Reports multiple acute episodes, previously resolving -Certainly could be gastroparesis versus infectious etiology although less likely -marijuana hyperemesis syndrome appears similar but patient's THC at admission was negative - denies MJ use -Continue Zofran, metoclopramide and continue to advance diet as tolerated  -Imaging thus far unremarkable -GI following, tentative plan for upper endoscopy in the next 24 hours per their schedule -Lipase minimally elevated, unlikely pancreatitis given minimal elevation -Ongoing hyperbilirubinemia -right upper quadrant ultrasound shows fatty liver and sludging otherwise unremarkable  Hypovolemic hyponatremia secondary to above -Likely in the setting of intractable nausea vomiting given profound AKI likely dehydration as well. -Patient may also have a component of chronic hyponatremia due to cirrhosis as well but unlikely primary cause.  Symptomatic hypotension in the setting of above, POA, resolving -Patient does not meet criteria for shock given resolution with IV fluids -Need to  hold home antihypertensive medications until indicated  Paroxysmal atrial flutter (HCC), rate controlled - Patient seems to be exhibiting bouts  of controlled atrial fibrillation and flutter on telemetry - Patient is currently rate controlled - Monitoring patient on telemetry -Currently unable to tolerate home medications given hypotension, resume Coreg once blood pressure improves  Chronic combined systolic and diastolic congestive heart failure (HCC) -Off diuretics and core measures in the setting of hypotension and dehydration as above - Cardiac MRI recently done several days ago is consistent with cardiac amyloidosis.   Patient is already receiving a regimen of diuretics and beta-blockers and is following as an outpatient with cardiology.  Continue outpatient follow-up. I am uncertain as to how this may be related to patient's diagnosis of cirrhosis however.  Hepatic cirrhosis/fatty liver (HCC) Recent diagnosis of cirrhosis 02/2020 based on MRCP that can be reviewed in epic. Patient is currently undergoing work-up by [facility], etiology has yet to be found, patient is to undergo outpatient endoscopy on the 17th of this month Considering significant hyperbilirubinemia and intractable nausea vomiting of undetermined etiology will obtain gastroenterology consultation in the morning.  Gout Continue home regimen of allopurinol   DVT prophylaxis: Eliquis Code Status: Full Family Communication: Prolonged discussion, greater than 30 minutes was spent at bedside discussing patient's case with his mother and father, we discussed patient's medications, dietary as well as social habits could be affecting his ability to tolerate p.o. safely.  Pending upper endoscopy tomorrow will have more answers, if everything is negative we discussed possible likelihood this is gastroparesis which would need to be controlled with medications per GI.  The patient is now tolerating p.o. quite well and is otherwise  advancing towards disposition the next few days.  Status is: Inpatient  Dispo: The patient is from: Home              Anticipated d/c is to: To be determined              Anticipated d/c date is: 24-48h              Patient currently not medically stable for discharge given the need for ongoing medical work-up, evaluation, imaging, upper endoscopy and lab work.  Patient also continues to be on IV fluids in the setting of poor p.o. intake due to intractable nausea vomiting.  Consultants:   GI  Procedures:   Possible upper endoscopy  Antimicrobials:  Not indicated  Subjective: No acute issues or events overnight, denies chest pain, shortness of breath, headache, fevers, chills, nausea, vomiting.  Objective: Vitals:   06/17/20 1942 06/17/20 1944 06/18/20 0003 06/18/20 0301  BP: 91/65 (!) 95/51 (!) 99/56   Pulse: 66 67 (!) 58 76  Resp: (!) 22 (!) 27 (!) 23 18  Temp:   98.3 F (36.8 C) 98.1 F (36.7 C)  TempSrc:   Oral Oral  SpO2: 98% 98% 99% 98%  Weight:      Height:        Intake/Output Summary (Last 24 hours) at 06/18/2020 0758 Last data filed at 06/17/2020 2300 Gross per 24 hour  Intake 660 ml  Output --  Net 660 ml   Filed Weights   06/16/20 0817  Weight: 116.1 kg    Examination:  General:  Pleasantly resting in bed, No acute distress. HEENT:  Normocephalic atraumatic.  Sclerae nonicteric, noninjected.  Extraocular movements intact bilaterally. Neck:  Without mass or deformity.  Trachea is midline. Lungs:  Clear to auscultate bilaterally without rhonchi, wheeze, or rales. Heart:  Regular rate and rhythm.  Without murmurs, rubs, or gallops. Abdomen:  Soft, nontender, nondistended.  Without  guarding or rebound. Extremities: Without cyanosis, clubbing, edema, or obvious deformity. Vascular:  Dorsalis pedis and posterior tibial pulses palpable bilaterally. Skin:  Warm and dry, no erythema, no ulcerations.   Data Reviewed: I have personally reviewed following  labs and imaging studies  CBC: Recent Labs  Lab 06/15/20 1834 06/16/20 1522 06/17/20 0150 06/18/20 0552  WBC 10.0 8.0 7.5 6.1  NEUTROABS  --  5.2 4.3  --   HGB 18.1* 18.3* 17.9* 15.2  HCT 55.3* 57.1* 55.2* 46.1  MCV 89.5 91.7 91.1 89.9  PLT 240 190 211 176   Basic Metabolic Panel: Recent Labs  Lab 06/15/20 1834 06/16/20 1522 06/17/20 0150 06/18/20 0552  NA 132* 137 135 136  K 4.2 5.0 4.9 4.6  CL 92* 98 96* 100  CO2 25 20* 24 27  GLUCOSE 130* 86 76 89  BUN 45* 38* 26* 16  CREATININE 4.53* 2.77* 2.39* 2.12*  CALCIUM 9.9 9.9 9.9 9.2  MG  --   --  2.2  --    GFR: Estimated Creatinine Clearance: 59.6 mL/min (A) (by C-G formula based on SCr of 2.12 mg/dL (H)). Liver Function Tests: Recent Labs  Lab 06/15/20 1834 06/16/20 1522 06/17/20 0150 06/18/20 0552  AST 32 39 42* 52*  ALT 34 38 44 53*  ALKPHOS 86 90 82 85  BILITOT 6.9* 5.5* 6.5* 3.5*  PROT 7.8 7.2 7.8 6.1*  ALBUMIN 4.0 3.8 4.0 3.1*   Recent Labs  Lab 06/16/20 1522  LIPASE 69*   No results for input(s): AMMONIA in the last 168 hours. Coagulation Profile: Recent Labs  Lab 06/16/20 1843  INR 1.3*   Cardiac Enzymes: No results for input(s): CKTOTAL, CKMB, CKMBINDEX, TROPONINI in the last 168 hours. BNP (last 3 results) No results for input(s): PROBNP in the last 8760 hours. HbA1C: Recent Labs    06/16/20 1522  HGBA1C 4.7*   CBG: Recent Labs  Lab 06/15/20 1858  GLUCAP 127*   Lipid Profile: No results for input(s): CHOL, HDL, LDLCALC, TRIG, CHOLHDL, LDLDIRECT in the last 72 hours. Thyroid Function Tests: No results for input(s): TSH, T4TOTAL, FREET4, T3FREE, THYROIDAB in the last 72 hours. Anemia Panel: No results for input(s): VITAMINB12, FOLATE, FERRITIN, TIBC, IRON, RETICCTPCT in the last 72 hours. Sepsis Labs: Recent Labs  Lab 06/15/20 1954  LATICACIDVEN 1.8    Recent Results (from the past 240 hour(s))  Culture, blood (routine x 2)     Status: None (Preliminary result)    Collection Time: 06/15/20  7:54 PM   Specimen: BLOOD  Result Value Ref Range Status   Specimen Description BLOOD SITE NOT SPECIFIED  Final   Special Requests   Final    BOTTLES DRAWN AEROBIC AND ANAEROBIC Blood Culture adequate volume   Culture   Final    NO GROWTH 2 DAYS Performed at [facility], [address] [place], [place] [address]    Report Status PENDING  Incomplete  Culture, blood (routine x 2)     Status: None (Preliminary result)   Collection Time: 06/15/20  8:56 PM   Specimen: BLOOD  Result Value Ref Range Status   Specimen Description BLOOD SITE NOT SPECIFIED  Final   Special Requests   Final    BOTTLES DRAWN AEROBIC AND ANAEROBIC Blood Culture adequate volume   Culture   Final    NO GROWTH 2 DAYS Performed at [facility], [address] [place], [place] [address]    Report Status PENDING  Incomplete  SARS  Coronavirus 2 by RT PCR (hospital order, performed in [facility] hospital lab) Nasopharyngeal Nasopharyngeal Swab     Status: None   Collection Time: 06/15/20  9:03 PM   Specimen: Nasopharyngeal Swab  Result Value Ref Range Status   SARS Coronavirus 2 NEGATIVE NEGATIVE Final    Comment: (NOTE) SARS-CoV-2 target nucleic acids are NOT DETECTED.  The SARS-CoV-2 RNA is generally detectable in upper and lower respiratory specimens during the acute phase of infection. The lowest concentration of SARS-CoV-2 viral copies this assay can detect is 250 copies / mL. A negative result does not preclude SARS-CoV-2 infection and should not be used as the sole basis for treatment or other patient management decisions.  A negative result may occur with improper specimen collection / handling, submission of specimen other than nasopharyngeal swab, presence of viral mutation(s) within the areas targeted by this assay, and inadequate number of viral copies (<250 copies / mL). A negative result must be combined with clinical observations, patient history, and  epidemiological information.  Fact Sheet for Patients:   BoilerBrush.com.cy  Fact Sheet for Healthcare Providers: https://pope.com/  This test is not yet approved or  cleared by the [place] FDA and has been authorized for detection and/or diagnosis of SARS-CoV-2 by FDA under an Emergency Use Authorization (EUA).  This EUA will remain in effect (meaning this test can be used) for the duration of the COVID-19 declaration under Section 564(b)(1) of the Act, 21 U.S.C. section 360bbb-3(b)(1), unless the authorization is terminated or revoked sooner.  Performed at [facility], [address], [place], [place] [address]   Culture, Urine     Status: Abnormal   Collection Time: 06/16/20 12:38 AM   Specimen: Urine, Random  Result Value Ref Range Status   Specimen Description URINE, RANDOM  Final   Special Requests NONE  Final   Culture (A)  Final    <10,000 COLONIES/mL INSIGNIFICANT GROWTH Performed at [facility], [address] [place], [place] [address]    Report Status 06/17/2020 FINAL  Final         Radiology Studies: US RENAL  Result Date: 06/16/2020 CLINICAL DATA:  Acute renal failure. EXAM: RENAL / URINARY TRACT ULTRASOUND COMPLETE COMPARISON:  MRI 03/13/2020. FINDINGS: Right Kidney: Renal measurements: 11.2 x 5.8 x 6.0 cm = volume: 202.1 mL. Increased echogenicity consistent with chronic renal disease. No mass or hydronephrosis visualized. Left Kidney: Renal measurements: 12.7 x 6.5 x 5.8 cm = volume: 248.6 mL. Increased echogenicity consistent chronic renal disease. No mass or hydronephrosis visualized. Bladder: Appears normal for degree of bladder distention. Other: None. IMPRESSION: 1.  Increased echogenicity consistent chronic medical renal disease. 2.  No acute abnormality.  No hydronephrosis. Electronically Signed   By: Maisie Fus  Register   On: 06/16/2020 08:46   Scheduled Meds: . allopurinol  200 mg Oral  Daily  . apixaban  5 mg Oral BID  . ondansetron (ZOFRAN) IV  4 mg Intravenous Q6H  . pantoprazole (PROTONIX) IV  40 mg Intravenous Q12H   Continuous Infusions:   LOS: 3 days   Time spent:  Azucena Fallen, DO [facility]  If 7PM-7AM, please contact night-coverage [email or web address]  06/18/2020, 7:58 AM

## 2020-06-18 NOTE — Progress Notes (Signed)
Subjective: Tolerating diet.  Objective: Vital signs in last 24 hours: Temp:  [97.8 F (36.6 C)-98.3 F (36.8 C)] 97.8 F (36.6 C) (08/05 0815) Pulse Rate:  [58-76] 62 (08/05 0815) Resp:  [15-27] 22 (08/05 0815) BP: (86-103)/(51-77) 99/56 (08/05 0003) SpO2:  [98 %-99 %] 98 % (08/05 0815) Weight change:  Last BM Date: 06/17/20  PE: GEN:  Overweight  Lab Results: CBC    Component Value Date/Time   WBC 6.1 06/18/2020 0552   RBC 5.13 06/18/2020 0552   HGB 15.2 06/18/2020 0552   HGB 12.6 (L) 04/04/2018 1253   HCT 46.1 06/18/2020 0552   HCT 39.9 04/04/2018 1253   PLT 176 06/18/2020 0552   PLT 190 04/04/2018 1253   MCV 89.9 06/18/2020 0552   MCV 89 04/04/2018 1253   MCH 29.6 06/18/2020 0552   MCHC 33.0 06/18/2020 0552   RDW 11.5 06/18/2020 0552   RDW 14.6 04/04/2018 1253   LYMPHSABS 2.0 06/17/2020 0150   LYMPHSABS 1.9 04/04/2018 1253   MONOABS 1.0 06/17/2020 0150   EOSABS 0.1 06/17/2020 0150   EOSABS 0.0 04/04/2018 1253   BASOSABS 0.1 06/17/2020 0150   BASOSABS 0.0 04/04/2018 1253   CMP     Component Value Date/Time   NA 136 06/18/2020 0552   NA 138 04/04/2018 1253   K 4.6 06/18/2020 0552   CL 100 06/18/2020 0552   CO2 27 06/18/2020 0552   GLUCOSE 89 06/18/2020 0552   BUN 16 06/18/2020 0552   BUN 14 04/04/2018 1253   CREATININE 2.12 (H) 06/18/2020 0552   CALCIUM 9.2 06/18/2020 0552   PROT 6.1 (L) 06/18/2020 0552   ALBUMIN 3.1 (L) 06/18/2020 0552   AST 52 (H) 06/18/2020 0552   ALT 53 (H) 06/18/2020 0552   ALKPHOS 85 06/18/2020 0552   BILITOT 3.5 (H) 06/18/2020 0552   GFRNONAA 39 (L) 06/18/2020 0552   GFRAA 45 (L) 06/18/2020 0552    Assessment:  1. Nausea, vomiting, intermittent x 1+ years. 2. Cirrhosis (cardiac related, superimposed on liver cause?). 3. Isolated hyperbilirubinemia. No biliary obstruction on ultrasound. 4. Elevated creatinine.  Plan:  1.  Parameters are improving; continue conservative management and we'll pursue endoscopy  tomorrow. 2.  [facility] will follow.   Freddy Jaksch 06/18/2020, 9:17 AM   Cell [phone] If no answer or after 5 PM call [phone]

## 2020-06-19 ENCOUNTER — Inpatient Hospital Stay (HOSPITAL_COMMUNITY): Payer: 59 | Admitting: Anesthesiology

## 2020-06-19 ENCOUNTER — Encounter (HOSPITAL_COMMUNITY): Payer: Self-pay | Admitting: Internal Medicine

## 2020-06-19 ENCOUNTER — Encounter (HOSPITAL_COMMUNITY)
Admission: EM | Disposition: A | Discharge status: Home / Self Care | Payer: Self-pay | Source: Home / Self Care | Attending: Internal Medicine

## 2020-06-19 HISTORY — PX: ESOPHAGEAL BRUSHING: SHX6842

## 2020-06-19 HISTORY — PX: ESOPHAGOGASTRODUODENOSCOPY (EGD) WITH PROPOFOL: SHX5813

## 2020-06-19 LAB — CBC
HCT: 46.9 % (ref 39.0–52.0)
Hemoglobin: 15.3 g/dL (ref 13.0–17.0)
MCH: 29.9 pg (ref 26.0–34.0)
MCHC: 32.6 g/dL (ref 30.0–36.0)
MCV: 91.6 fL (ref 80.0–100.0)
Platelets: 150 10*3/uL (ref 150–400)
RBC: 5.12 MIL/uL (ref 4.22–5.81)
RDW: 11.7 % (ref 11.5–15.5)
WBC: 5.6 10*3/uL (ref 4.0–10.5)
nRBC: 0 % (ref 0.0–0.2)

## 2020-06-19 LAB — COMPREHENSIVE METABOLIC PANEL
ALT: 71 U/L — ABNORMAL HIGH (ref 0–44)
AST: 55 U/L — ABNORMAL HIGH (ref 15–41)
Albumin: 3.1 g/dL — ABNORMAL LOW (ref 3.5–5.0)
Alkaline Phosphatase: 83 U/L (ref 38–126)
Anion gap: 10 (ref 5–15)
BUN: 13 mg/dL (ref 6–20)
CO2: 25 mmol/L (ref 22–32)
Calcium: 9.3 mg/dL (ref 8.9–10.3)
Chloride: 103 mmol/L (ref 98–111)
Creatinine, Ser: 1.88 mg/dL — ABNORMAL HIGH (ref 0.61–1.24)
GFR calc Af Amer: 52 mL/min — ABNORMAL LOW (ref >60)
GFR calc non Af Amer: 45 mL/min — ABNORMAL LOW (ref >60)
Glucose, Bld: 84 mg/dL (ref 70–99)
Potassium: 4.6 mmol/L (ref 3.5–5.1)
Sodium: 138 mmol/L (ref 135–145)
Total Bilirubin: 3.1 mg/dL — ABNORMAL HIGH (ref 0.3–1.2)
Total Protein: 6.3 g/dL — ABNORMAL LOW (ref 6.5–8.1)

## 2020-06-19 SURGERY — ESOPHAGOGASTRODUODENOSCOPY (EGD) WITH PROPOFOL
Anesthesia: Monitor Anesthesia Care | Laterality: Left

## 2020-06-19 MED ORDER — LIDOCAINE HCL (CARDIAC) PF 100 MG/5ML IV SOSY
PREFILLED_SYRINGE | INTRAVENOUS | Status: DC | PRN
  Administered 2020-06-19: 100 mg via INTRATRACHEAL

## 2020-06-19 MED ORDER — PROPOFOL 500 MG/50ML IV EMUL
INTRAVENOUS | Status: DC | PRN
  Administered 2020-06-19: 125 ug/kg/min via INTRAVENOUS

## 2020-06-19 MED ORDER — NYSTATIN 100000 UNIT/ML MT SUSP
5.0000 mL | Freq: Four times a day (QID) | OROMUCOSAL | Status: DC
  Administered 2020-06-19 – 2020-06-21 (×9): 500000 [IU] via ORAL
  Filled 2020-06-19 (×8): qty 5

## 2020-06-19 MED ORDER — LACTATED RINGERS IV SOLN: INTRAVENOUS | Status: DC | PRN

## 2020-06-19 SURGICAL SUPPLY — 15 items

## 2020-06-19 NOTE — Op Note (Addendum)
[facility] Patient Name: Bradley Powell Procedure Date : 06/19/2020 MRN: [id number] Attending MD: Shirley Friar , MD Date of Birth: [birth year] CSN: [id number] Age: 39 Admit Type: Inpatient Procedure:                Upper GI endoscopy Indications:              Nausea with vomiting, Cirrhosis Providers:                Shirley Friar, MD, Glory Rosebush, RN, Marc Morgans, Technician, Vinnie Langton CRNA, CRNA Referring MD:             hospital team Medicines:                Propofol per Anesthesia, Monitored Anesthesia Care Complications:            No immediate complications. Estimated Blood Loss:     Estimated blood loss: none. Procedure:                Pre-Anesthesia Assessment:                           - Prior to the procedure, a History and Physical                            was performed, and patient medications and                            allergies were reviewed. The patient's tolerance of                            previous anesthesia was also reviewed. The risks                            and benefits of the procedure and the sedation                            options and risks were discussed with the patient.                            All questions were answered, and informed consent                            was obtained. Prior Anticoagulants: The patient has                            taken Eliquis (apixaban), last dose was 1 day prior                            to procedure. ASA Grade Assessment: III - A patient                            with severe systemic disease. After reviewing the  risks and benefits, the patient was deemed in                            satisfactory condition to undergo the procedure.                           After obtaining informed consent, the endoscope was                            passed under direct vision. Throughout the                            procedure,  the patient's blood pressure, pulse, and                            oxygen saturations were monitored continuously. The                            [id number]-H190 ([id number] Olympus gastroscope was                            introduced through the mouth, and advanced to the                            second part of duodenum. The upper GI endoscopy was                            accomplished without difficulty. The patient                            tolerated the procedure well. Scope In: Scope Out: Findings:      Patchy, white plaques were found in the mid esophagus. Cells for       cytology were obtained by brushing. Estimated blood loss: none.      The exam of the esophagus was otherwise normal.      The Z-line was regular and was found 44 cm from the incisors.      One non-bleeding superficial gastric ulcer with no stigmata of bleeding       was found in the prepyloric region of the stomach. The lesion was 2 mm       in largest dimension.      Patchy mild inflammation characterized by congestion (edema) and       erythema was found in the gastric antrum.      The examined duodenum was normal. Impression:               - Esophageal plaques were found, suspicious for                            candidiasis. Cells for cytology obtained.                           - Z-line regular, 44 cm from the incisors.                           -  Non-bleeding gastric ulcer with no stigmata of                            bleeding.                           - Acute gastritis.                           - Normal examined duodenum. Recommendation:           - Clear liquid diet.                           - Await pathology results.                           - Observe patient's clinical course. Procedure Code(s):        --- Professional ---                           [phone], Esophagogastroduodenoscopy, flexible,                            transoral; diagnostic, including collection of                             specimen(s) by brushing or washing, when performed                            (separate procedure) Diagnosis Code(s):        --- Professional ---                           R11.2, Nausea with vomiting, unspecified                           K25.9, Gastric ulcer, unspecified as acute or                            chronic, without hemorrhage or perforation                           K29.00, Acute gastritis without bleeding                           K22.9, Disease of esophagus, unspecified CPT copyright 2019 American Medical Association. All rights reserved. The codes documented in this report are preliminary and upon coder review may  be revised to meet current compliance requirements. Shirley Friar, MD 06/19/2020 11:27:20 AM This report has been signed electronically. Number of Addenda: 0

## 2020-06-19 NOTE — Anesthesia Preprocedure Evaluation (Signed)
Anesthesia Evaluation  Patient identified by MRN, date of birth, ID band Patient awake    Reviewed: Allergy & Precautions, NPO status , Patient's Chart, lab work & pertinent test results  History of Anesthesia Complications Negative for: history of anesthetic complications  Airway Mallampati: II  TM Distance: >3 FB Neck ROM: Full    Dental   Pulmonary sleep apnea ,    Pulmonary exam normal        Cardiovascular +CHF  Normal cardiovascular exam+ dysrhythmias Atrial Fibrillation + Cardiac Defibrillator      Neuro/Psych negative neurological ROS  negative psych ROS   GI/Hepatic negative GI ROS, (+) Cirrhosis       ,   Endo/Other  negative endocrine ROS  Renal/GU ARFRenal disease  negative genitourinary   Musculoskeletal negative musculoskeletal ROS (+)   Abdominal   Peds  Hematology negative hematology ROS (+)   Anesthesia Other Findings  OSA, NICM 2/2 cardiac amyloidosis (EF 40-45%), HTN, A fib/flutter s/p ablation, cirrhosis, AKI  Reproductive/Obstetrics                           Anesthesia Physical Anesthesia Plan  ASA: IV  Anesthesia Plan: MAC   Post-op Pain Management:    Induction: Intravenous  PONV Risk Score and Plan: 1 and Propofol infusion, TIVA and Treatment may vary due to age or medical condition  Airway Management Planned: Natural Airway, Nasal Cannula and Simple Face Mask  Additional Equipment: None  Intra-op Plan:   Post-operative Plan:   Informed Consent: I have reviewed the patients History and Physical, chart, labs and discussed the procedure including the risks, benefits and alternatives for the proposed anesthesia with the patient or authorized representative who has indicated his/her understanding and acceptance.       Plan Discussed with:   Anesthesia Plan Comments:        Anesthesia Quick Evaluation

## 2020-06-19 NOTE — Anesthesia Postprocedure Evaluation (Signed)
Anesthesia Post Note  Patient: Bradley Powell  Procedure(s) Performed: ESOPHAGOGASTRODUODENOSCOPY (EGD) WITH PROPOFOL (Left ) ESOPHAGEAL BRUSHING     Patient location during evaluation: Endoscopy Anesthesia Type: MAC Level of consciousness: awake and alert Pain management: pain level controlled Vital Signs Assessment: post-procedure vital signs reviewed and stable Respiratory status: spontaneous breathing, nonlabored ventilation and respiratory function stable Cardiovascular status: blood pressure returned to baseline and stable Postop Assessment: no apparent nausea or vomiting Anesthetic complications: no   No complications documented.  Last Vitals:  Vitals:   06/19/20 1155 06/19/20 1233  BP: 116/85   Pulse: (!) 53   Resp: (!) 25 20  Temp:  37 C  SpO2: 98% 98%    Last Pain:  Vitals:   06/19/20 1233  TempSrc: Oral  PainSc:                  [name]

## 2020-06-19 NOTE — Interval H&P Note (Signed)
History and Physical Interval Note:  06/19/2020 11:05 AM  Bradley Powell  has presented today for surgery, with the diagnosis of nausea, vomiting.  The various methods of treatment have been discussed with the patient and family. After consideration of risks, benefits and other options for treatment, the patient has consented to  Procedure(s): ESOPHAGOGASTRODUODENOSCOPY (EGD) WITH PROPOFOL (Left) as a surgical intervention.  The patient's history has been reviewed, patient examined, no change in status, stable for surgery.  I have reviewed the patient's chart and labs.  Questions were answered to the patient's satisfaction.     Shirley Friar

## 2020-06-19 NOTE — Progress Notes (Signed)
PROGRESS NOTE    Bradley Powell  AJO:[id number] DOB: [birth year] DOA: 06/15/2020 PCP: Seward Carol, MD   Brief Narrative:  39 year old male with past medical history of obstructive sleep apnea, systolic and diastolic congestive heart failure  (last echo 12/2019 EF 40-45%), nonischemic cardiomyopathy with recent finding of cardiac amyloidosis on MRI, morbid obesity, hypertension, hyperlipidemia, atrial fibrillation and flutter (S/P Ablation 03/2018) as well as recent diagnosis of hepatic cirrhosis (02/2020) who presents to [facility] emergency department with complaints of nausea and vomiting. Patient explains that over the past several months he has been having lengthy bouts of nausea vomiting and difficulty tolerating oral intake. For this particular episode, approximately 2 weeks ago the patient began to develop nausea and vomiting.  Symptoms have been severe frequent bouts of nonbilious nonbloody vomiting daily.  Patient has had difficulty tolerating oral intake with both solids and liquids over the span of time.  Patient denies any associated abdominal pain, diarrhea, recent ingestion of undercooked food, dysuria or low back pain. As the patient's symptoms have persisted over the past 2 weeks patient has developed associated generalized weakness and bouts of intense lightheadedness.  Patient denies recent travel, sick contacts or confirmed contact with COVID-19 infection. Patient eventually presented to [facility] emergency department for evaluation.  Upon evaluation in the emergency department patient was found to be extremely hypotensive initially with blood pressures as low 65/36.  Blood pressures have gradually increased throughout the hospital stay with several boluses of intravenous isotonic fluids.  Is also been found to be septic from acute kidney injury with creatinine 4.53.  Hospitalist group has now been called to assess patient for admission the hospital.  Assessment &  Plan:   Principal Problem:   Acute kidney injury [facility]) Active Problems:   Gout   Paroxysmal atrial flutter (HCC)   Chronic combined systolic and diastolic congestive heart failure (HCC)   Hepatic cirrhosis (HCC)   Dehydration with hyponatremia   Hypotension due to hypovolemia   Intractable nausea and vomiting   Acute kidney injury with possible acute tubular necrosis in the setting of profound dehydration ([place]) - Secondary to below intractable nausea and vomiting -Continue IV fluids, follow I's and O's, creatinine downtrending appropriately which is reassuring.   - Avoiding nephrotoxic agents bitemporally with holding diuretics and ARB. Lab Results  Component Value Date   CREATININE 1.88 (H) 06/19/2020   CREATININE 2.12 (H) 06/18/2020   CREATININE 2.39 (H) 06/17/2020    Intractable nausea and vomiting POA, unclear etiology -Reports multiple acute episodes, previously resolving on their own without intervention -Certainly could be gastroparesis -alternative diagnosis would include cannabinoid hyperemesis syndrome which appears similar but patient's THC at admission was negative - denies MJ use -Continue Zofran, metoclopramide and continue to advance diet as tolerated  -Imaging thus far unremarkable -upper endoscopy remarkable for candidiasis, nonbleeding ulcer and gastritis, unlikely to cause such severe symptoms in the patient. -GI following -patient placed back on clear liquids today post endoscopy due to previous episodes of nausea without vomiting -Lipase minimally elevated, unlikely pancreatitis given minimal elevation -Ongoing mild hyperbilirubinemia -right upper quadrant ultrasound shows fatty liver and sludging otherwise unremarkable -without clear etiology -Could consider further work-up in the outpatient setting with surgery if possible gallbladder removal would improve patient's symptoms but again this intractable nausea vomiting does not appear to be particularly related  to any type of food and usually resolves on its own with supportive care Hepatic Function Latest Ref Rng & Units 06/19/2020 06/18/2020 06/17/2020  Total Protein 6.5 - 8.1 g/dL 6.3(L) 6.1(L) 7.8  Albumin 3.5 - 5.0 g/dL 3.1(L) 3.1(L) 4.0  AST 15 - 41 U/L 55(H) 52(H) 42(H)  ALT 0 - 44 U/L 71(H) 53(H) 44  Alk Phosphatase 38 - 126 U/L 83 85 82  Total Bilirubin 0.3 - 1.2 mg/dL 3.1(H) 3.5(H) 6.5(H)   Hypovolemic hyponatremia secondary to above -Likely in the setting of intractable nausea vomiting given profound AKI likely dehydration as well. -Patient may also have a component of chronic hyponatremia due to cirrhosis as well but unlikely primary cause.  Symptomatic hypotension in the setting of above, POA, resolving -Patient does not meet criteria for shock given resolution with IV fluids -Need to hold home antihypertensive medications until indicated  Paroxysmal atrial flutter ([place]), rate controlled - Patient seems to be exhibiting bouts of controlled atrial fibrillation and flutter on telemetry - Patient is currently rate controlled - Monitoring patient on telemetry -Currently unable to tolerate home medications given hypotension, resume Coreg once blood pressure improves  Chronic combined systolic and diastolic congestive heart failure (HCC) -Off diuretics and core measures in the setting of hypotension and dehydration as above - Cardiac MRI recently done several days ago is consistent with cardiac amyloidosis.   Patient is already receiving a regimen of diuretics and beta-blockers and is following as an outpatient with cardiology.  Continue outpatient follow-up. I am uncertain as to how this may be related to patient's diagnosis of cirrhosis however.  Hepatic cirrhosis/fatty liver (HCC) Recent diagnosis of cirrhosis 02/2020 based on MRCP that can be reviewed in epic. Patient is currently undergoing work-up by [facility], etiology has yet to be found, patient is to undergo outpatient endoscopy on  the 17th of this month Considering significant hyperbilirubinemia and intractable nausea vomiting of undetermined etiology will obtain gastroenterology consultation in the morning.  Gout Continue home regimen of allopurinol   DVT prophylaxis: Eliquis Code Status: Full Family Communication: Prolonged discussion, greater than 30 minutes was spent at bedside discussing patient's case with his mother and father yesterday, again today with the patient prolonged discussion at least 30 minutes we discussed patient's medications, dietary as well as social habits could be affecting his ability to tolerate p.o. safely.  Pending upper endoscopy tomorrow will have more answers, if everything is negative we discussed possible likelihood this is gastroparesis which would need to be controlled with medications per GI.  The patient is now tolerating p.o. quite well and is otherwise advancing towards disposition the next few days.  Status is: Inpatient  Dispo: The patient is from: Home              Anticipated d/c is to: Home              Anticipated d/c date is: 24-48h              Patient currently not medically stable for discharge given the need for ongoing medical work-up, evaluation, imaging, upper endoscopy and lab work.  Patient back on clear liquid diet per GI, unclear if he can tolerate p.o. appropriately at this time, follow intake over the next 12 hours  Consultants:   GI  Procedures:   Possible upper endoscopy  Antimicrobials:  Not indicated  Subjective: No acute issues or events overnight, denies chest pain, shortness of breath, headache, fevers, chills, nausea, vomiting.  Objective: Vitals:   06/19/20 1125 06/19/20 1145 06/19/20 1155 06/19/20 1233  BP: (!) 96/54 111/75 116/85   Pulse: (!) 51 (!) 54 (!) 53  Resp: (!) 30 19 (!) 25 20  Temp:    98.6 F (37 C)  TempSrc:    Oral  SpO2: 100% 98% 98% 98%  Weight:      Height:        Intake/Output Summary (Last 24 hours) at  06/19/2020 1511 Last data filed at 06/19/2020 1124 Gross per 24 hour  Intake 200 ml  Output 10 ml  Net 190 ml   Filed Weights   06/16/20 0817  Weight: 116.1 kg    Examination:  General:  Pleasantly resting in bed, No acute distress. HEENT:  Normocephalic atraumatic.  Sclerae nonicteric, noninjected.  Extraocular movements intact bilaterally. Neck:  Without mass or deformity.  Trachea is midline. Lungs:  Clear to auscultate bilaterally without rhonchi, wheeze, or rales. Heart:  Regular rate and rhythm.  Without murmurs, rubs, or gallops. Abdomen:  Soft, nontender, nondistended.  Without guarding or rebound. Extremities: Without cyanosis, clubbing, edema, or obvious deformity. Vascular:  Dorsalis pedis and posterior tibial pulses palpable bilaterally. Skin:  Warm and dry, no erythema, no ulcerations.   Data Reviewed: I have personally reviewed following labs and imaging studies  CBC: Recent Labs  Lab 06/15/20 1834 06/16/20 1522 06/17/20 0150 06/18/20 0552 06/19/20 0748  WBC 10.0 8.0 7.5 6.1 5.6  NEUTROABS  --  5.2 4.3  --   --   HGB 18.1* 18.3* 17.9* 15.2 15.3  HCT 55.3* 57.1* 55.2* 46.1 46.9  MCV 89.5 91.7 91.1 89.9 91.6  PLT 240 190 211 176 774   Basic Metabolic Panel: Recent Labs  Lab 06/15/20 1834 06/16/20 1522 06/17/20 0150 06/18/20 0552 06/19/20 0748  NA 132* 137 135 136 138  K 4.2 5.0 4.9 4.6 4.6  CL 92* 98 96* 100 103  CO2 25 20* _0 GLUCOSE 130* 86 76 89 84  BUN 45* 38* 26* 16 13  CREATININE 4.53* 2.77* 2.39* 2.12* 1.88*  CALCIUM 9.9 9.9 9.9 9.2 9.3  MG  --   --  2.2  --   --    GFR: Estimated Creatinine Clearance: 67.2 mL/min (A) (by C-G formula based on SCr of 1.88 mg/dL (H)). Liver Function Tests: Recent Labs  Lab 06/15/20 1834 06/16/20 1522 06/17/20 0150 06/18/20 0552 06/19/20 0748  AST 32 39 42* 52* 55*  ALT 34 38 44 53* 71*  ALKPHOS 86 90 82 85 83  BILITOT 6.9* 5.5* 6.5* 3.5* 3.1*  PROT 7.8 7.2 7.8 6.1* 6.3*  ALBUMIN 4.0 3.8  4.0 3.1* 3.1*   Recent Labs  Lab 06/16/20 1522  LIPASE 69*   No results for input(s): AMMONIA in the last 168 hours. Coagulation Profile: Recent Labs  Lab 06/16/20 1843  INR 1.3*   Cardiac Enzymes: No results for input(s): CKTOTAL, CKMB, CKMBINDEX, TROPONINI in the last 168 hours. BNP (last 3 results) No results for input(s): PROBNP in the last 8760 hours. HbA1C: Recent Labs    06/16/20 1522  HGBA1C 4.7*   CBG: Recent Labs  Lab 06/15/20 1858  GLUCAP 127*   Lipid Profile: No results for input(s): CHOL, HDL, LDLCALC, TRIG, CHOLHDL, LDLDIRECT in the last 72 hours. Thyroid Function Tests: No results for input(s): TSH, T4TOTAL, FREET4, T3FREE, THYROIDAB in the last 72 hours. Anemia Panel: No results for input(s): VITAMINB12, FOLATE, FERRITIN, TIBC, IRON, RETICCTPCT in the last 72 hours. Sepsis Labs: Recent Labs  Lab 06/15/20 1954  LATICACIDVEN 1.8    Recent Results (from the past 240 hour(s))  Culture, blood (routine x 2)  Status: None (Preliminary result)   Collection Time: 06/15/20  7:54 PM   Specimen: BLOOD  Result Value Ref Range Status   Specimen Description BLOOD SITE NOT SPECIFIED  Final   Special Requests   Final    BOTTLES DRAWN AEROBIC AND ANAEROBIC Blood Culture adequate volume   Culture   Final    NO GROWTH 4 DAYS Performed at [facility], [address] [place], [place] [address]    Report Status PENDING  Incomplete  Culture, blood (routine x 2)     Status: None (Preliminary result)   Collection Time: 06/15/20  8:56 PM   Specimen: BLOOD  Result Value Ref Range Status   Specimen Description BLOOD SITE NOT SPECIFIED  Final   Special Requests   Final    BOTTLES DRAWN AEROBIC AND ANAEROBIC Blood Culture adequate volume   Culture   Final    NO GROWTH 4 DAYS Performed at [facility], [address], [place], [place] [address]    Report Status PENDING  Incomplete  SARS Coronavirus 2 by RT PCR (hospital order, performed in [facility] hospital lab) Nasopharyngeal Nasopharyngeal Swab     Status: None   Collection Time: 06/15/20  9:03 PM   Specimen: Nasopharyngeal Swab  Result Value Ref Range Status   SARS Coronavirus 2 NEGATIVE NEGATIVE Final    Comment: (NOTE) SARS-CoV-2 target nucleic acids are NOT DETECTED.  The SARS-CoV-2 RNA is generally detectable in upper and lower respiratory specimens during the acute phase of infection. The lowest concentration of SARS-CoV-2 viral copies this assay can detect is 250 copies / mL. A negative result does not preclude SARS-CoV-2 infection and should not be used as the sole basis for treatment or other patient management decisions.  A negative result may occur with improper specimen collection / handling, submission of specimen other than nasopharyngeal swab, presence of viral mutation(s) within the areas targeted by this assay, and inadequate number of viral copies (<250 copies / mL). A negative result must be combined with clinical observations, patient history, and epidemiological information.  Fact Sheet for Patients:   StrictlyIdeas.no  Fact Sheet for Healthcare Providers: BankingDealers.co.za  This test is not yet approved or  cleared by the [place] FDA and has been authorized for detection and/or diagnosis of SARS-CoV-2 by FDA under an Emergency Use Authorization (EUA).  This EUA will remain in effect (meaning this test can be used) for the duration of the COVID-19 declaration under Section 564(b)(1) of the Act, 21 U.S.C. section 360bbb-3(b)(1), unless the authorization is terminated or revoked sooner.  Performed at [facility], [place] [address], [place], [place] [address]   Culture, Urine     Status: Abnormal   Collection Time: 06/16/20 12:38 AM   Specimen: Urine, Random  Result Value Ref Range Status   Specimen Description URINE, RANDOM  Final   Special Requests NONE  Final   Culture (A)  Final      <10,000 COLONIES/mL INSIGNIFICANT GROWTH Performed at [facility], [place] [address] [place], [place] [address]    Report Status 06/17/2020 FINAL  Final         Radiology Studies: No results found. Scheduled Meds:  allopurinol  200 mg Oral Daily   apixaban  5 mg Oral BID   nystatin  5 mL Oral QID   ondansetron (ZOFRAN) IV  4 mg Intravenous Q6H   pantoprazole (PROTONIX) IV  40 mg Intravenous Q12H   Continuous Infusions:  LOS: 4 days   Time spent: 76mn  Adrine Hayworth C Lorrine Killilea, DO [facility]  If 7PM-7AM, please contact night-coverage [email or web address]  06/19/2020, 3:11 PM

## 2020-06-19 NOTE — Transfer of Care (Signed)
Immediate Anesthesia Transfer of Care Note  Patient: Bradley Powell  Procedure(s) Performed: ESOPHAGOGASTRODUODENOSCOPY (EGD) WITH PROPOFOL (Left ) ESOPHAGEAL BRUSHING  Patient Location: Endoscopy Unit  Anesthesia Type:MAC  Level of Consciousness: awake, alert , oriented and patient cooperative  Airway & Oxygen Therapy: Patient Spontanous Breathing and Patient connected to nasal cannula oxygen  Post-op Assessment: Report given to RN and Post -op Vital signs reviewed and stable  Post vital signs: Reviewed and stable  Last Vitals:  Vitals Value Taken Time  BP    Temp    Pulse 60 06/19/20 1124  Resp 24 06/19/20 1124  SpO2 100 % 06/19/20 1124  Vitals shown include unvalidated device data.  Last Pain:  Vitals:   06/19/20 0959  TempSrc: Oral  PainSc: 0-No pain         Complications: No complications documented.

## 2020-06-19 NOTE — Brief Op Note (Signed)
Minimal esophageal candidiasis - s/p cytologic brushing. Tiny nonbleeding gastric ulcer and gastritis. See endopro for details. Nystatin started. Clear liquid diet. [facility] will f/u tomorrow.

## 2020-06-20 LAB — COMPREHENSIVE METABOLIC PANEL
ALT: 59 U/L — ABNORMAL HIGH (ref 0–44)
AST: 36 U/L (ref 15–41)
Albumin: 3.1 g/dL — ABNORMAL LOW (ref 3.5–5.0)
Alkaline Phosphatase: 78 U/L (ref 38–126)
Anion gap: 8 (ref 5–15)
BUN: 7 mg/dL (ref 6–20)
CO2: 25 mmol/L (ref 22–32)
Calcium: 9.4 mg/dL (ref 8.9–10.3)
Chloride: 102 mmol/L (ref 98–111)
Creatinine, Ser: 1.66 mg/dL — ABNORMAL HIGH (ref 0.61–1.24)
GFR calc Af Amer: >60 mL/min (ref >60)
GFR calc non Af Amer: 52 mL/min — ABNORMAL LOW (ref >60)
Glucose, Bld: 81 mg/dL (ref 70–99)
Potassium: 4.2 mmol/L (ref 3.5–5.1)
Sodium: 135 mmol/L (ref 135–145)
Total Bilirubin: 3.6 mg/dL — ABNORMAL HIGH (ref 0.3–1.2)
Total Protein: 6.1 g/dL — ABNORMAL LOW (ref 6.5–8.1)

## 2020-06-20 LAB — CBC
HCT: 45.6 % (ref 39.0–52.0)
Hemoglobin: 14.9 g/dL (ref 13.0–17.0)
MCH: 29.2 pg (ref 26.0–34.0)
MCHC: 32.7 g/dL (ref 30.0–36.0)
MCV: 89.4 fL (ref 80.0–100.0)
Platelets: 149 10*3/uL — ABNORMAL LOW (ref 150–400)
RBC: 5.1 MIL/uL (ref 4.22–5.81)
RDW: 11.6 % (ref 11.5–15.5)
WBC: 5.4 10*3/uL (ref 4.0–10.5)
nRBC: 0 % (ref 0.0–0.2)

## 2020-06-20 LAB — CULTURE, BLOOD (ROUTINE X 2)
Culture: NO GROWTH
Culture: NO GROWTH
Special Requests: ADEQUATE
Special Requests: ADEQUATE

## 2020-06-20 MED ORDER — PANTOPRAZOLE SODIUM 40 MG PO TBEC
40.0000 mg | DELAYED_RELEASE_TABLET | Freq: Two times a day (BID) | ORAL | Status: DC
  Administered 2020-06-20 – 2020-06-21 (×2): 40 mg via ORAL
  Filled 2020-06-20 (×2): qty 1

## 2020-06-20 MED ORDER — ONDANSETRON HCL 4 MG PO TABS
4.0000 mg | ORAL_TABLET | Freq: Four times a day (QID) | ORAL | Status: DC | PRN
  Administered 2020-06-21 (×2): 4 mg via ORAL
  Filled 2020-06-20 (×2): qty 1

## 2020-06-20 NOTE — Progress Notes (Signed)
[facility] Progress Note  Subjective: Patient doing better today.  He was found on EGD to have Candida esophagitis and nonbleeding gastric ulcers.  Wants to advance diet.  Some nausea, no vomiting  Objective: Vital signs in last 24 hours: Temp:  [97.8 F (36.6 C)-98.7 F (37.1 C)] 98.7 F (37.1 C) (08/07 0814) Pulse Rate:  [51-79] 56 (08/07 0814) Resp:  [18-30] 20 (08/07 0814) BP: (96-116)/(54-85) 106/68 (08/07 0814) SpO2:  [95 %-100 %] 100 % (08/07 0814) Weight change:    PE:  No distress  Abdomen soft and nontender    Lab Results: Results for orders placed or performed during the hospital encounter of 06/15/20 (from the past 24 hour(s))  CBC     Status: Abnormal   Collection Time: 06/20/20  7:11 AM  Result Value Ref Range   WBC 5.4 4.0 - 10.5 K/uL   RBC 5.10 4.22 - 5.81 MIL/uL   Hemoglobin 14.9 13.0 - 17.0 g/dL   HCT 27.7 39 - 52 %   MCV 89.4 80.0 - 100.0 fL   MCH 29.2 26.0 - 34.0 pg   MCHC 32.7 30.0 - 36.0 g/dL   RDW 41.2 87.8 - 67.6 %   Platelets 149 (L) 150 - 400 K/uL   nRBC 0.0 0.0 - 0.2 %  Comprehensive metabolic panel     Status: Abnormal   Collection Time: 06/20/20  7:11 AM  Result Value Ref Range   Sodium 135 135 - 145 mmol/L   Potassium 4.2 3.5 - 5.1 mmol/L   Chloride 102 98 - 111 mmol/L   CO2 25 22 - 32 mmol/L   Glucose, Bld 81 70 - 99 mg/dL   BUN 7 6 - 20 mg/dL   Creatinine, Ser 7.20 (H) 0.61 - 1.24 mg/dL   Calcium 9.4 8.9 - 94.7 mg/dL   Total Protein 6.1 (L) 6.5 - 8.1 g/dL   Albumin 3.1 (L) 3.5 - 5.0 g/dL   AST 36 15 - 41 U/L   ALT 59 (H) 0 - 44 U/L   Alkaline Phosphatase 78 38 - 126 U/L   Total Bilirubin 3.6 (H) 0.3 - 1.2 mg/dL   GFR calc non Af Amer 52 (L) >60 mL/min   GFR calc Af Amer >60 >60 mL/min   Anion gap 8 5 - 15    Studies/Results: No results found.    Assessment: Candida esophagitis  Gastric ulcer  Plan:   Continue current management for above.  I think we can sign off from a GI standpoint.  He can  follow-up with Dr. Bosie Clos as an outpatient.  Call us if needed.    SAM F Elyse Prevo 06/20/2020, 11:20 AM  Pager: [phone] If no answer or after 5 PM call [phone]

## 2020-06-20 NOTE — Progress Notes (Signed)
Pt denies N/V throughout nightshift. No issues. Will continue to monitor.

## 2020-06-20 NOTE — Progress Notes (Signed)
Patient placed himself on CPAP unit for the night

## 2020-06-20 NOTE — Plan of Care (Signed)

## 2020-06-20 NOTE — Progress Notes (Signed)
PROGRESS NOTE    Bradley Powell  OIN:[id number] DOB: 1981-02-04 DOA: 06/15/2020 PCP: [name], MD   Brief Narrative:  39 year old male with past medical history of obstructive sleep apnea, systolic and diastolic congestive heart failure(last echo 12/2019 EF 40-45%),nonischemic cardiomyopathy with recent finding of cardiac amyloidosis on MRI, morbid obesity, hypertension, hyperlipidemia, atrial fibrillation and flutter(S/P Ablation 03/2018)as well as recent diagnosis of hepatic cirrhosis(02/2020)who presents to [facility] emergency department with complaints of nausea and vomiting. Patient explains that over the past several months he has been having lengthy bouts of nausea vomiting and difficulty tolerating oral intake. For this particular episode, approximately 2 weeks ago the patient began to develop nausea and vomiting. Symptoms have been severe frequent bouts of nonbilious nonbloody vomiting daily. Patient has had difficulty tolerating oral intake with both solids and liquids over the span of time. Patient denies any associated abdominal pain, diarrhea, recent ingestion of undercooked food, dysuria or low back pain. As the patient's symptoms have persisted over the past 2 weeks patient has developed associated generalized weakness and bouts of intense lightheadedness. Patient denies recent travel, sick contacts or confirmed contact with COVID-19 infection. Patient eventually presented to [facility] emergency department for evaluation. Upon evaluation in the emergency department patient was found to be extremely hypotensive initially with blood pressures as low 65/36. Blood pressures have gradually increased throughout the hospital stay with several boluses of intravenous isotonic fluids. Is also been found to be septic from acute kidney injury with creatinine 4.53. Hospitalist group has now been called toassess patient for admission the hospital   Assessment &  Plan:   Principal Problem:   Acute kidney injury [facility]) Active Problems:   Gout   Paroxysmal atrial flutter (HCC)   Chronic combined systolic and diastolic congestive heart failure (HCC)   Hepatic cirrhosis (HCC)   Dehydration with hyponatremia   Hypotension due to hypovolemia   Intractable nausea and vomiting   Acute kidney injurywith possible acute tubular necrosis in the setting of profound dehydration(HCC) - Secondary to below intractable nausea and vomiting -Continue IV fluids, follow I's and O's, creatinine downtrending appropriately which is reassuring.   - Avoiding nephrotoxic agents bitemporally with holding diuretics and ARB.  Intractable nausea and vomiting POA, unclear etiology -Reports multiple acute episodes, previously resolving on their own without intervention -Certainly could be gastroparesis -alternative diagnosis would include cannabinoid hyperemesis syndrome which appears similar but patient's THC at admission was negative - denies MJ use -Continue Zofran, metoclopramide and continue to advance diet as tolerated  -Imaging thus far unremarkable -upper endoscopy remarkable for candidiasis, nonbleeding ulcer and gastritis, unlikely to cause such severe symptoms in the patient. -GI following -patient placed back on clear liquids today post endoscopy due to previous episodes of nausea without vomiting -Lipase minimally elevated, unlikely pancreatitis given minimal elevation -Ongoing mild hyperbilirubinemia -right upper quadrant ultrasound shows fatty liver and sludging otherwise unremarkable -without clear etiology -Could consider further work-up in the outpatient setting with surgery if possible gallbladder removal would improve patient's symptoms but again this intractable nausea vomiting does not appear to be particularly related to any type of food and usually resolves on its own with supportive care  Hypovolemic hyponatremia secondary to above -Likely in the  setting of intractable nausea vomiting given profound AKI likely dehydration as well. -Patient may also have a component of chronic hyponatremia due to cirrhosis as well but unlikely primary cause.  Symptomatic hypotension in the setting of above, POA, resolving -Patient does not meet  criteria for shock given resolution with IV fluids -Need to hold home antihypertensive medications until indicated  Paroxysmal atrial flutter (HCC), rate controlled - Patient seems to be exhibiting bouts of controlled atrial fibrillation and flutter on telemetry - Patient is currently rate controlled - Monitoring patient on telemetry -Currently unable to tolerate home medications given hypotension, resume Coreg once blood pressure improves  Chronic combined systolic and diastolic congestive heart failure (HCC) -Off diuretics and core measures in the setting of hypotension and dehydration as above - Cardiac MRI recently done several days ago is consistent with cardiac amyloidosis.  Patient is already receiving a regimen of diuretics and beta-blockers and is following as an outpatient with cardiology.Continue outpatient follow-up.I am uncertain as to how this may be related to patient's diagnosis of cirrhosis however.  Hepatic cirrhosis/fatty liver (HCC) Recent diagnosis of cirrhosis 02/2020 based on MRCP that can be reviewed in epic. Patient is currently undergoing work-up by [facility], etiology has yet to be found, patient is to undergo outpatient endoscopy on the 17th of this month Considering significant hyperbilirubinemia and intractable nausea vomiting of undetermined etiology will obtain gastroenterology consultation in the morning.  Gout Continue home regimen of allopurinol   DVT prophylaxis: [id number]  Code Status: full    Code Status Orders  (From admission, onward)         Start     Ordered   06/16/20 0058  Full code  Continuous        06/16/20 0057        Code Status History      Date Active Date Inactive Code Status Order ID Comments User Context   12/20/2019 1338 12/20/2019 1949 Full Code [id number]  Dolores Patty, MD Inpatient   04/06/2018 1728 04/07/2018 1540 Full Code [id number]  Marinus Maw, MD Inpatient   02/24/2017 1445 03/07/2017 1559 Full Code [id number]  Leone Brand, NP Inpatient   Advance Care Planning Activity     Family Communication: called mother, lm Disposition Plan:   Status is: Inpatient  Remains inpatient appropriate because:Inpatient level of care appropriate due to severity of illness   Dispo:  Patient From: Home  Planned Disposition: Home  Expected discharge date: 06/20/20  Medically stable for discharge: No        Consults called: None Admission status: Inpatient   Consultants:   gi  Procedures:  DG Abdomen 1 View  Result Date: 06/15/2020 CLINICAL DATA:  39 year old male with shortness of breath. EXAM: ABDOMEN - 1 VIEW COMPARISON:  Radiograph dated 02/14/2019. FINDINGS: There is no bowel dilatation or evidence of obstruction. No free air or radiopaque calculi. The osseous structures and soft tissues are grossly unremarkable. IMPRESSION: Negative. Electronically Signed   By: Elgie Collard M.D.   On: 06/15/2020 20:26   US RENAL  Result Date: 06/16/2020 CLINICAL DATA:  Acute renal failure. EXAM: RENAL / URINARY TRACT ULTRASOUND COMPLETE COMPARISON:  MRI 03/13/2020. FINDINGS: Right Kidney: Renal measurements: 11.2 x 5.8 x 6.0 cm = volume: 202.1 mL. Increased echogenicity consistent with chronic renal disease. No mass or hydronephrosis visualized. Left Kidney: Renal measurements: 12.7 x 6.5 x 5.8 cm = volume: 248.6 mL. Increased echogenicity consistent chronic renal disease. No mass or hydronephrosis visualized. Bladder: Appears normal for degree of bladder distention. Other: None. IMPRESSION: 1.  Increased echogenicity consistent chronic medical renal disease. 2.  No acute abnormality.  No hydronephrosis. Electronically  Signed   By: Maisie Fus  Register   On: 06/16/2020 08:46   DG Chest Port 1  View  Result Date: 06/15/2020 CLINICAL DATA:  Shortness of breath and dehydration. EXAM: PORTABLE CHEST 1 VIEW COMPARISON:  February 25, 2017 FINDINGS: There is no evidence of acute infiltrate, pleural effusion or pneumothorax. The heart size and mediastinal contours are within normal limits. The visualized skeletal structures are unremarkable. IMPRESSION: No active disease. Electronically Signed   By: Aram Candela M.D.   On: 06/15/2020 20:25   MR Card Morphology Wo/W Cm  Result Date: 06/12/2020 CLINICAL DATA:  Cardiomyopathy, follow up r/o infiltrative disease EXAM: CARDIAC MRI TECHNIQUE: The patient was scanned on a 1.5 Tesla GE magnet. A dedicated cardiac coil was used. Functional imaging was done using Fiesta sequences. 2,3, and 4 chamber views were done to assess for RWMA's. Modified Simpson's rule using a short axis stack was used to calculate an ejection fraction on a dedicated work [employer]. The patient received 10mL GADAVIST GADOBUTROL 1 MMOL/ML IV SOLN. After 10 minutes inversion recovery sequences were used to assess for infiltration and scar tissue. FINDINGS: LEFT VENTRICLE: Upper limit of normal left ventricular chamber size by volumetric assessment. Normal left ventricular wall thickness. Maximal wall thickness 10 mm at the ventricular septal base. Mildly reduced left ventricular systolic function. LVEF = 45%, mild global hypokinesis. There is post contrast delayed myocardial enhancement. Diffuse throughout basal, mid and apical distribution. Apparent sparing of the subendocardium, primarily in the ventricular septum. Abnormal T1 myocardial nulling kinetics. ECV = 55%. These findings in combination suggest an infiltrative process such as cardiac amyloidosis. RIGHT VENTRICLE: Mildly enlarged right ventricular chamber size. Normal right ventricular wall thickness. Mildly reduced right ventricular systolic  function. RVEF = 44% Mild global hypokinesis. No post contrast delayed myocardial enhancement of the RV free wall. ATRIA: Moderate biatrial chamber enlargement. VALVES: No significant valvular abnormalities noted on this exam. PERICARDIUM: Normal pericardium.  No pericardial effusion. OTHER: Dilated IVC. Cannot exclude possible anomalous pulmonary venous drainage in to the mid SVC. Consider ECG gated CTA chest for further evaluation. MEASUREMENTS: Measurements are approximate values due to motion artifact likely secondary to arrhythmia. Left ventricle: LV male LV EF: 45% (Normal 56-78%) Absolute volumes: LV EDV: 193 mL (Normal 77-195 mL) LV ESV: 106 mL (Normal 19-72 mL) LV SV: 87 mL (Normal 51-133 mL) CO: 4.6  L/min (Normal 2.8-8.8 L/min) Indexed volumes: LV EDV: 76 mL/sq-m (Normal 47-92 mL/sq-m) LV ESV: 42 mL/sq-m (Normal 13-30 mL/sq-m) LV SV: 34 mL/sq-m (Normal 32-62 mL/sq-m) CI: 1.8 L/min/sq-m (Normal 1.7-4.2 L/min/sq-m) Right ventricle: RV male RV EF: 44 % (Normal 47-74%) Absolute volumes: RV EDV: 226 mL (Normal 88-227 mL) RV ESV: 149 mL (Normal 23-103 mL) RV SV: 117 mL (Normal 52-138 mL) CO: 6.1 L/min (Normal 2.8-8.8 L/min) Indexed volumes: RV EDV: 105 ML/sq-m (Normal 55-105 mL/sq-m) RV ESV: 56 mL/sq-m (Normal 15-43 mL/sq-m) RV SV: 46 mL/sq-m (Normal 32-64 mL/sq-m) CI: 2.4  L/min/sq-m (Normal 1.7-4.2 L/min/sq-m) IMPRESSION: 1. Findings suggestive of infiltrative cardiomyopathy. Native T1 ECV 55%, with diffuse post contrast delayed myocardial enhancement and abnormal T1 myocardial nulling kinetics, consistent with a diagnosis of cardiac amyloidosis. 2.  Upper limit of normal biventricular chamber size. 3. Mildly reduced biventricular systolic function. LVEF 45%, RVEF 44%. 4. Cannot exclude possible anomalous pulmonary venous drainage in to the mid SVC. Seen on axial HASTE image series 3 image 4, but evaluation limited due to poor spatial resolution. Consider ECG gated CTA chest for further evaluation.  Electronically Signed   By: Weston Brass   On: 06/12/2020 09:12   US Abdomen Limited RUQ  Result Date: 06/16/2020 CLINICAL DATA:  Hyperbilirubinemia EXAM: ULTRASOUND ABDOMEN LIMITED RIGHT UPPER QUADRANT COMPARISON:  02/18/2019 FINDINGS: Gallbladder: Increased echoes in the gallbladder consistent with sludge. No stones or wall thickening. Murphy's sign was negative. Common bile duct: Diameter: 5 mm, normal Liver: Increased parenchymal echotexture suggesting fatty infiltration. No focal lesions. Portal vein is patent on color Doppler imaging with normal direction of blood flow towards the liver. Other: None. IMPRESSION: Gallbladder sludge suggested. No cholelithiasis or cholecystitis. Probable fatty infiltration of the liver. Electronically Signed   By: Burman Nieves M.D.   On: 06/16/2020 01:14     Antimicrobials:   none    Subjective: Pt reports still with abd discomfort, advancing diet, mild nausea  Objective: Vitals:   06/20/20 0009 06/20/20 0421 06/20/20 0814 06/20/20 1221  BP: 108/77 113/81 106/68 105/76  Pulse: 65 61 (!) 56 (!) 54  Resp:  Temp:  98 F (36.7 C) 98.7 F (37.1 C) 98 F (36.7 C)  TempSrc:  Oral  Oral  SpO2: 100% 96% 100% 100%  Weight:      Height:       No intake or output data in the 24 hours ending 06/20/20 1338 Filed Weights   06/16/20 0817  Weight: 116.1 kg    Examination:  General exam: Appears calm and comfortable  Respiratory system: Clear to auscultation. Respiratory effort normal. Cardiovascular system: S1 & S2 heard, RRR. No JVD, murmurs, rubs, gallops or clicks. No pedal edema. Gastrointestinal system: Abdomen is nondistended, soft and mildly tender-nonfocal. No organomegaly or masses felt. Normal bowel sounds heard. Central nervous system: Alert and oriented. No focal neurological deficits. Extremities: wwp no edema Skin: No rashes, lesions or ulcers Psychiatry: Judgement and insight appear normal. Mood & affect appropriate.       Data Reviewed: I have personally reviewed following labs and imaging studies  CBC: Recent Labs  Lab 06/16/20 1522 06/17/20 0150 06/18/20 0552 06/19/20 0748 06/20/20 0711  WBC 8.0 7.5 6.1 5.6 5.4  NEUTROABS 5.2 4.3  --   --   --   HGB 18.3* 17.9* 15.2 15.3 14.9  HCT 57.1* 55.2* 46.1 46.9 45.6  MCV 91.7 91.1 89.9 91.6 89.4  PLT 190 211 176 150 149*   Basic Metabolic Panel: Recent Labs  Lab 06/16/20 1522 06/17/20 0150 06/18/20 0552 06/19/20 0748 06/20/20 0711  NA 137 135 136 138 135  K 5.0 4.9 4.6 4.6 4.2  CL 98 96* 100 103 102  CO2 20* GLUCOSE 86 76 89 84 81  BUN 38* 26* CREATININE 2.77* 2.39* 2.12* 1.88* 1.66*  CALCIUM 9.9 9.9 9.2 9.3 9.4  MG  --  2.2  --   --   --    GFR: Estimated Creatinine Clearance: 76.1 mL/min (A) (by C-G formula based on SCr of 1.66 mg/dL (H)). Liver Function Tests: Recent Labs  Lab 06/16/20 1522 06/17/20 0150 06/18/20 0552 06/19/20 0748 06/20/20 0711  AST 39 42* 52* 55* 36  ALT 38 44 53* 71* 59*  ALKPHOS 90 82 85 83 78  BILITOT 5.5* 6.5* 3.5* 3.1* 3.6*  PROT 7.2 7.8 6.1* 6.3* 6.1*  ALBUMIN 3.8 4.0 3.1* 3.1* 3.1*   Recent Labs  Lab 06/16/20 1522  LIPASE 69*   No results for input(s): AMMONIA in the last 168 hours. Coagulation Profile: Recent Labs  Lab 06/16/20 1843  INR 1.3*   Cardiac Enzymes: No results for input(s): CKTOTAL, CKMB, CKMBINDEX, TROPONINI in the  last 168 hours. BNP (last 3 results) No results for input(s): PROBNP in the last 8760 hours. HbA1C: No results for input(s): HGBA1C in the last 72 hours. CBG: Recent Labs  Lab 06/15/20 1858  GLUCAP 127*   Lipid Profile: No results for input(s): CHOL, HDL, LDLCALC, TRIG, CHOLHDL, LDLDIRECT in the last 72 hours. Thyroid Function Tests: No results for input(s): TSH, T4TOTAL, FREET4, T3FREE, THYROIDAB in the last 72 hours. Anemia Panel: No results for input(s): VITAMINB12, FOLATE, FERRITIN, TIBC, IRON, RETICCTPCT in the last 72  hours. Sepsis Labs: Recent Labs  Lab 06/15/20 1954  LATICACIDVEN 1.8    Recent Results (from the past 240 hour(s))  Culture, blood (routine x 2)     Status: None   Collection Time: 06/15/20  7:54 PM   Specimen: BLOOD  Result Value Ref Range Status   Specimen Description BLOOD SITE NOT SPECIFIED  Final   Special Requests   Final    BOTTLES DRAWN AEROBIC AND ANAEROBIC Blood Culture adequate volume   Culture   Final    NO GROWTH 5 DAYS Performed at [facility], [address], [place], [place] [address]    Report Status 06/20/2020 FINAL  Final  Culture, blood (routine x 2)     Status: None   Collection Time: 06/15/20  8:56 PM   Specimen: BLOOD  Result Value Ref Range Status   Specimen Description BLOOD SITE NOT SPECIFIED  Final   Special Requests   Final    BOTTLES DRAWN AEROBIC AND ANAEROBIC Blood Culture adequate volume   Culture   Final    NO GROWTH 5 DAYS Performed at [facility], [address] [place], [place] [address]    Report Status 06/20/2020 FINAL  Final  SARS Coronavirus 2 by RT PCR (hospital order, performed in [facility] hospital lab) Nasopharyngeal Nasopharyngeal Swab     Status: None   Collection Time: 06/15/20  9:03 PM   Specimen: Nasopharyngeal Swab  Result Value Ref Range Status   SARS Coronavirus 2 NEGATIVE NEGATIVE Final    Comment: (NOTE) SARS-CoV-2 target nucleic acids are NOT DETECTED.  The SARS-CoV-2 RNA is generally detectable in upper and lower respiratory specimens during the acute phase of infection. The lowest concentration of SARS-CoV-2 viral copies this assay can detect is 250 copies / mL. A negative result does not preclude SARS-CoV-2 infection and should not be used as the sole basis for treatment or other patient management decisions.  A negative result may occur with improper specimen collection / handling, submission of specimen other than nasopharyngeal swab, presence of viral mutation(s) within the areas targeted by  this assay, and inadequate number of viral copies (<250 copies / mL). A negative result must be combined with clinical observations, patient history, and epidemiological information.  Fact Sheet for Patients:   BoilerBrush.com.cy  Fact Sheet for Healthcare Providers: https://pope.com/  This test is not yet approved or  cleared by the [place] FDA and has been authorized for detection and/or diagnosis of SARS-CoV-2 by FDA under an Emergency Use Authorization (EUA).  This EUA will remain in effect (meaning this test can be used) for the duration of the COVID-19 declaration under Section 564(b)(1) of the Act, 21 U.S.C. section 360bbb-3(b)(1), unless the authorization is terminated or revoked sooner.  Performed at [facility], [address], [place], [place] [address]   Culture, Urine     Status: Abnormal   Collection Time: 06/16/20 12:38 AM   Specimen: Urine, Random  Result Value Ref Range Status   Specimen Description URINE, RANDOM  Final   Special Requests NONE  Final   Culture (A)  Final    <10,000 COLONIES/mL INSIGNIFICANT GROWTH Performed at [facility], [address], [place], [place] [address]    Report Status 06/17/2020 FINAL  Final         Radiology Studies: No results found.      Scheduled Meds: . allopurinol  200 mg Oral Daily  . apixaban  5 mg Oral BID  . nystatin  5 mL Oral QID  . ondansetron (ZOFRAN) IV  4 mg Intravenous Q6H  . pantoprazole (PROTONIX) IV  40 mg Intravenous Q12H   Continuous Infusions:   LOS: 5 days    Time spent: 35 min    Burke Keels, MD [facility]  If 7PM-7AM, please contact night-coverage  06/20/2020, 1:38 PM

## 2020-06-20 NOTE — Progress Notes (Signed)
Patient placed himself on CPAP for the night. RT will continue to monitor.

## 2020-06-21 DIAGNOSIS — M109 Gout, unspecified: Secondary | ICD-10-CM

## 2020-06-21 LAB — COMPREHENSIVE METABOLIC PANEL
ALT: 59 U/L — ABNORMAL HIGH (ref 0–44)
AST: 41 U/L (ref 15–41)
Albumin: 3.5 g/dL (ref 3.5–5.0)
Alkaline Phosphatase: 87 U/L (ref 38–126)
Anion gap: 9 (ref 5–15)
BUN: 7 mg/dL (ref 6–20)
CO2: 26 mmol/L (ref 22–32)
Calcium: 9.7 mg/dL (ref 8.9–10.3)
Chloride: 103 mmol/L (ref 98–111)
Creatinine, Ser: 1.56 mg/dL — ABNORMAL HIGH (ref 0.61–1.24)
GFR calc Af Amer: >60 mL/min (ref >60)
GFR calc non Af Amer: 56 mL/min — ABNORMAL LOW (ref >60)
Glucose, Bld: 83 mg/dL (ref 70–99)
Potassium: 4.3 mmol/L (ref 3.5–5.1)
Sodium: 138 mmol/L (ref 135–145)
Total Bilirubin: 2.8 mg/dL — ABNORMAL HIGH (ref 0.3–1.2)
Total Protein: 6.8 g/dL (ref 6.5–8.1)

## 2020-06-21 LAB — CBC
HCT: 46.5 % (ref 39.0–52.0)
Hemoglobin: 15.3 g/dL (ref 13.0–17.0)
MCH: 29.7 pg (ref 26.0–34.0)
MCHC: 32.9 g/dL (ref 30.0–36.0)
MCV: 90.1 fL (ref 80.0–100.0)
Platelets: 215 10*3/uL (ref 150–400)
RBC: 5.16 MIL/uL (ref 4.22–5.81)
RDW: 11.7 % (ref 11.5–15.5)
WBC: 6 10*3/uL (ref 4.0–10.5)
nRBC: 0 % (ref 0.0–0.2)

## 2020-06-21 LAB — MRSA PCR SCREENING: MRSA by PCR: NEGATIVE

## 2020-06-21 MED ORDER — CARVEDILOL 3.125 MG PO TABS: 3.1250 mg | ORAL_TABLET | Freq: Two times a day (BID) | ORAL | 0 refills | Status: DC

## 2020-06-21 MED ORDER — TORSEMIDE 5 MG PO TABS: 5.0000 mg | ORAL_TABLET | Freq: Every day | ORAL | 0 refills | Status: DC

## 2020-06-21 MED ORDER — SPIRONOLACTONE 25 MG PO TABS: 12.5000 mg | ORAL_TABLET | Freq: Every day | ORAL | 0 refills | Status: DC

## 2020-06-21 MED ORDER — PANTOPRAZOLE SODIUM 40 MG PO TBEC: 40.0000 mg | DELAYED_RELEASE_TABLET | Freq: Two times a day (BID) | ORAL | 0 refills | Status: DC

## 2020-06-21 MED ORDER — PROMETHAZINE HCL 12.5 MG RE SUPP: 12.5000 mg | Freq: Four times a day (QID) | RECTAL | 0 refills | Status: DC | PRN

## 2020-06-21 MED ORDER — ONDANSETRON 4 MG PO TBDP
4.0000 mg | ORAL_TABLET | Freq: Three times a day (TID) | ORAL | 0 refills | Status: DC | PRN
Start: 2020-06-21 — End: 2022-02-15

## 2020-06-21 NOTE — Discharge Summary (Signed)
Physician Discharge Summary  Bradley Powell XBW:[id number] DOB: Jun 16, 1981 DOA: 06/15/2020  PCP: [name], MD  Admit date: 06/15/2020 Discharge date: 06/21/2020  Admitted From: Inpatient Disposition: home  Recommendations for Outpatient Follow-up:  1. Follow up with PCP in 1-2 weeks 2. Please obtain BMP/CBC in one week 3. Please follow up on the following pending results:  Home Health:No Equipment/Devices:none  Discharge Condition:Stable CODE STATUS:Full code Diet recommendation: Regular healthy diet  Brief/Interim Summary: 39 year old male with past medical history of obstructive sleep apnea, systolic and diastolic congestive heart failure(last echo 12/2019 EF 40-45%),nonischemic cardiomyopathy with recent finding of cardiac amyloidosis on MRI, morbid obesity, hypertension, hyperlipidemia, atrial fibrillation and flutter(S/P Ablation 03/2018)as well as recent diagnosis of hepatic cirrhosis(02/2020)who presents to [facility] emergency department with complaints of nausea and vomiting. Patient explains that over the past several months he has been having lengthy bouts of nausea vomiting and difficulty tolerating oral intake. For this particular episode, approximately 2 weeks ago the patient began to develop nausea and vomiting. Symptoms have been severe frequent bouts of nonbilious nonbloody vomiting daily. Patient has had difficulty tolerating oral intake with both solids and liquids over the span of time. Patient denies any associated abdominal pain, diarrhea, recent ingestion of undercooked food, dysuria or low back pain. As the patient's symptoms have persisted over the past 2 weeks patient has developed associated generalized weakness and bouts of intense lightheadedness. Patient denies recent travel, sick contacts or confirmed contact with COVID-19 infection. Patient eventually presented to [facility] emergency department for evaluation. Upon evaluation in  the emergency department patient was found to be extremely hypotensive initially with blood pressures as low 65/36. Blood pressures have gradually increased throughout the hospital stay with several boluses of intravenous isotonic fluids. Is also been found to be septic from acute kidney injury with creatinine 4.53. Hospitalist group has now been called toassess patient for admission the hospital  Hospital course: Acute kidney injurywith possible acute tubular necrosis in the setting of profound dehydration(HCC) - Secondary to below intractable nausea and vomiting -Continue IV fluids, follow I's and O's, creatinine downtrending appropriately which is reassuring.  - Avoiding nephrotoxic agents bitemporally with holding diuretics and ARB.  Intractable nausea and vomiting POA, unclear etiology -Reports multiple acute episodes, previously resolvingon their own without intervention -Continued Zofran, metoclopramide and advanced diet -GI sw in consultation, upper endoscopy remarkable for candidiasis, nonbleeding ulcer and gastritis, unlikely to cause such severe symptoms in the patient. -Ongoingmildhyperbilirubinemia -right upper quadrant ultrasound shows fatty liver and sludging otherwise unremarkable -without clear etiology -Could consider further work-up in the outpatient setting with surgery if possible gallbladder removal would improve patient's symptoms but again this intractable nausea vomiting does not appear to be particularly related to any type of food and usually resolves on its own with supportive care  Hypovolemic hyponatremia secondary to above -resolved with hydration.  Symptomatic hypotension in the setting of above, POA, resolving -2/2 hypovolemia, resolved with hydration  Paroxysmal atrial flutter (HCC), rate controlled - resumed coreg, -f/up with PCP fdor further eval and continue with outpt cardiology  Chronic combined systolic and diastolic congestive heart  failure (HCC) - Cardiac MRI recently done several days ago is consistent with cardiac amyloidosis.  Patient is already receiving a regimen of diuretics and beta-blockers and is following as an outpatient with cardiology.Continue outpatient follow-up.I am uncertain as to how this may be related to patient's diagnosis of cirrhosis however.  Hepatic cirrhosis/fatty liver (HCC) Recent diagnosis of cirrhosis 02/2020 based on MRCP  that can be reviewed in epic. Patient is currently undergoing work-up by [facility], etiology has yet to be found, patient is to undergo outpatient endoscopy on the 17th of this month Considering significant hyperbilirubinemia and intractable nausea vomiting of undetermined etiology will obtain gastroenterology consultation in the morning.  Gout Continue home regimen of allopurinol   Discharge Diagnoses:  Principal Problem:   Acute kidney injury (HCC) Active Problems:   Gout   Paroxysmal atrial flutter (HCC)   Chronic combined systolic and diastolic congestive heart failure (HCC)   Hepatic cirrhosis (HCC)   Dehydration with hyponatremia   Hypotension due to hypovolemia   Intractable nausea and vomiting    Discharge Instructions  Discharge Instructions    (HEART FAILURE PATIENTS) Call MD:  Anytime you have any of the following symptoms: 1) 3 pound weight gain in 24 hours or 5 pounds in 1 week 2) shortness of breath, with or without a dry hacking cough 3) swelling in the hands, feet or stomach 4) if you have to sleep on extra pillows at night in order to breathe.   Complete by: As directed    Call MD for:   Complete by: As directed    Call MD for:  difficulty breathing, headache or visual disturbances   Complete by: As directed    Call MD for:  persistant dizziness or light-headedness   Complete by: As directed    Call MD for:  persistant dizziness or light-headedness   Complete by: As directed    Call MD for:  persistant nausea and vomiting   Complete  by: As directed    Call MD for:  persistant nausea and vomiting   Complete by: As directed    Call MD for:  temperature >100.4   Complete by: As directed    Diet - low sodium heart healthy   Complete by: As directed    Diet - low sodium heart healthy   Complete by: As directed    Increase activity slowly   Complete by: As directed    Increase activity slowly   Complete by: As directed      Allergies as of 06/21/2020      Reactions   Entresto [sacubitril-valsartan] Nausea And Vomiting, Other (See Comments)   Lightheaded, fainting      Medication List    STOP taking these medications   losartan 100 MG tablet Commonly known as: COZAAR   ondansetron 4 MG tablet Commonly known as: ZOFRAN     TAKE these medications   acetaminophen 500 MG tablet Commonly known as: TYLENOL Take 1,000 mg by mouth every 6 (six) hours as needed for moderate pain or headache.   allopurinol 100 MG tablet Commonly known as: ZYLOPRIM Take 2 tablets (200 mg total) by mouth daily. Please contact PCP for further refills   apixaban 5 MG Tabs tablet Commonly known as: ELIQUIS Take 1 tablet (5 mg total) by mouth 2 (two) times daily.   carvedilol 3.125 MG tablet Commonly known as: COREG Take 1 tablet (3.125 mg total) by mouth 2 (two) times daily with a meal. What changed:   medication strength  how much to take   Jardiance 10 MG Tabs tablet Generic drug: empagliflozin Take 10 mg by mouth daily before breakfast.   Mitigare 0.6 MG Caps Generic drug: Colchicine Take 0.6 mg by mouth daily as needed (gout flare).   ondansetron 4 MG disintegrating tablet Commonly known as: Zofran ODT Take 1 tablet (4 mg total) by mouth every 8 (  eight) hours as needed for nausea or vomiting.   pantoprazole 40 MG tablet Commonly known as: PROTONIX Take 1 tablet (40 mg total) by mouth 2 (two) times daily.   promethazine 12.5 MG suppository Commonly known as: Promethegan Place 1 suppository (12.5 mg total)  rectally every 6 (six) hours as needed for nausea or vomiting.   spironolactone 25 MG tablet Commonly known as: ALDACTONE Take 0.5 tablets (12.5 mg total) by mouth daily. What changed: See the new instructions.   torsemide 5 MG tablet Commonly known as: DEMADEX Take 1 tablet (5 mg total) by mouth daily. What changed:   medication strength  how much to take  when to take this  reasons to take this       Allergies  Allergen Reactions  . Entresto [Sacubitril-Valsartan] Nausea And Vomiting and Other (See Comments)    Lightheaded, fainting    Consultations:  GI   Procedures/Studies: DG Abdomen 1 View  Result Date: 06/15/2020 CLINICAL DATA:  39 year old male with shortness of breath. EXAM: ABDOMEN - 1 VIEW COMPARISON:  Radiograph dated 02/14/2019. FINDINGS: There is no bowel dilatation or evidence of obstruction. No free air or radiopaque calculi. The osseous structures and soft tissues are grossly unremarkable. IMPRESSION: Negative. Electronically Signed   By: Elgie Collard M.D.   On: 06/15/2020 20:26   US RENAL  Result Date: 06/16/2020 CLINICAL DATA:  Acute renal failure. EXAM: RENAL / URINARY TRACT ULTRASOUND COMPLETE COMPARISON:  MRI 03/13/2020. FINDINGS: Right Kidney: Renal measurements: 11.2 x 5.8 x 6.0 cm = volume: 202.1 mL. Increased echogenicity consistent with chronic renal disease. No mass or hydronephrosis visualized. Left Kidney: Renal measurements: 12.7 x 6.5 x 5.8 cm = volume: 248.6 mL. Increased echogenicity consistent chronic renal disease. No mass or hydronephrosis visualized. Bladder: Appears normal for degree of bladder distention. Other: None. IMPRESSION: 1.  Increased echogenicity consistent chronic medical renal disease. 2.  No acute abnormality.  No hydronephrosis. Electronically Signed   By: Maisie Fus  Register   On: 06/16/2020 08:46   DG Chest Port 1 View  Result Date: 06/15/2020 CLINICAL DATA:  Shortness of breath and dehydration. EXAM: PORTABLE CHEST 1  VIEW COMPARISON:  February 25, 2017 FINDINGS: There is no evidence of acute infiltrate, pleural effusion or pneumothorax. The heart size and mediastinal contours are within normal limits. The visualized skeletal structures are unremarkable. IMPRESSION: No active disease. Electronically Signed   By: Aram Candela M.D.   On: 06/15/2020 20:25   MR Card Morphology Wo/W Cm  Result Date: 06/12/2020 CLINICAL DATA:  Cardiomyopathy, follow up r/o infiltrative disease EXAM: CARDIAC MRI TECHNIQUE: The patient was scanned on a 1.5 Tesla GE magnet. A dedicated cardiac coil was used. Functional imaging was done using Fiesta sequences. 2,3, and 4 chamber views were done to assess for RWMA's. Modified Simpson's rule using a short axis stack was used to calculate an ejection fraction on a dedicated work [employer]. The patient received 1mL GADAVIST GADOBUTROL 1 MMOL/ML IV SOLN. After 10 minutes inversion recovery sequences were used to assess for infiltration and scar tissue. FINDINGS: LEFT VENTRICLE: Upper limit of normal left ventricular chamber size by volumetric assessment. Normal left ventricular wall thickness. Maximal wall thickness 10 mm at the ventricular septal base. Mildly reduced left ventricular systolic function. LVEF = 45%, mild global hypokinesis. There is post contrast delayed myocardial enhancement. Diffuse throughout basal, mid and apical distribution. Apparent sparing of the subendocardium, primarily in the ventricular septum. Abnormal T1 myocardial nulling kinetics. ECV = 55%. These  findings in combination suggest an infiltrative process such as cardiac amyloidosis. RIGHT VENTRICLE: Mildly enlarged right ventricular chamber size. Normal right ventricular wall thickness. Mildly reduced right ventricular systolic function. RVEF = 44% Mild global hypokinesis. No post contrast delayed myocardial enhancement of the RV free wall. ATRIA: Moderate biatrial chamber enlargement. VALVES: No  significant valvular abnormalities noted on this exam. PERICARDIUM: Normal pericardium.  No pericardial effusion. OTHER: Dilated IVC. Cannot exclude possible anomalous pulmonary venous drainage in to the mid SVC. Consider ECG gated CTA chest for further evaluation. MEASUREMENTS: Measurements are approximate values due to motion artifact likely secondary to arrhythmia. Left ventricle: LV male LV EF: 45% (Normal 56-78%) Absolute volumes: LV EDV: 193 mL (Normal 77-195 mL) LV ESV: 106 mL (Normal 19-72 mL) LV SV: 87 mL (Normal 51-133 mL) CO: 4.6  L/min (Normal 2.8-8.8 L/min) Indexed volumes: LV EDV: 76 mL/sq-m (Normal 47-92 mL/sq-m) LV ESV: 42 mL/sq-m (Normal 13-30 mL/sq-m) LV SV: 34 mL/sq-m (Normal 32-62 mL/sq-m) CI: 1.8 L/min/sq-m (Normal 1.7-4.2 L/min/sq-m) Right ventricle: RV male RV EF: 44 % (Normal 47-74%) Absolute volumes: RV EDV: 226 mL (Normal 88-227 mL) RV ESV: 149 mL (Normal 23-103 mL) RV SV: 117 mL (Normal 52-138 mL) CO: 6.1 L/min (Normal 2.8-8.8 L/min) Indexed volumes: RV EDV: 105 ML/sq-m (Normal 55-105 mL/sq-m) RV ESV: 56 mL/sq-m (Normal 15-43 mL/sq-m) RV SV: 46 mL/sq-m (Normal 32-64 mL/sq-m) CI: 2.4  L/min/sq-m (Normal 1.7-4.2 L/min/sq-m) IMPRESSION: 1. Findings suggestive of infiltrative cardiomyopathy. Native T1 ECV 55%, with diffuse post contrast delayed myocardial enhancement and abnormal T1 myocardial nulling kinetics, consistent with a diagnosis of cardiac amyloidosis. 2.  Upper limit of normal biventricular chamber size. 3. Mildly reduced biventricular systolic function. LVEF 45%, RVEF 44%. 4. Cannot exclude possible anomalous pulmonary venous drainage in to the mid SVC. Seen on axial HASTE image series 3 image 4, but evaluation limited due to poor spatial resolution. Consider ECG gated CTA chest for further evaluation. Electronically Signed   By: Weston Brass   On: 06/12/2020 09:12   US Abdomen Limited RUQ  Result Date: 06/16/2020 CLINICAL DATA:  Hyperbilirubinemia EXAM: ULTRASOUND ABDOMEN  LIMITED RIGHT UPPER QUADRANT COMPARISON:  02/18/2019 FINDINGS: Gallbladder: Increased echoes in the gallbladder consistent with sludge. No stones or wall thickening. Murphy's sign was negative. Common bile duct: Diameter: 5 mm, normal Liver: Increased parenchymal echotexture suggesting fatty infiltration. No focal lesions. Portal vein is patent on color Doppler imaging with normal direction of blood flow towards the liver. Other: None. IMPRESSION: Gallbladder sludge suggested. No cholelithiasis or cholecystitis. Probable fatty infiltration of the liver. Electronically Signed   By: Burman Nieves M.D.   On: 06/16/2020 01:14       Subjective:   Discharge Exam: Vitals:   06/21/20 0351 06/21/20 0744  BP: 93/73 103/76  Pulse: (!) 34 62  Resp: 19 20  Temp: 98.2 F (36.8 C) 97.6 F (36.4 C)  SpO2: 100% 97%   Vitals:   06/21/20 0034 06/21/20 0351 06/21/20 0353 06/21/20 0744  BP: 101/69 93/73  103/76  Pulse: 61 (!) 34  62  Resp: (!) 23 19  20   Temp: 98.3 F (36.8 C) 98.2 F (36.8 C)  97.6 F (36.4 C)  TempSrc: Oral Oral  Oral  SpO2: 100% 100%  97%  Weight:   125.2 kg   Height:        General: Pt is alert, awake, not in acute distress Cardiovascular: RRR, S1/S2 +, no rubs, no gallops Respiratory: CTA bilaterally, no wheezing, no rhonchi Abdominal: Soft, NT, ND,  bowel sounds + Extremities: no edema, no cyanosis    The results of significant diagnostics from this hospitalization (including imaging, microbiology, ancillary and laboratory) are listed below for reference.     Microbiology: Recent Results (from the past 240 hour(s))  Culture, blood (routine x 2)     Status: None   Collection Time: 06/15/20  7:54 PM   Specimen: BLOOD  Result Value Ref Range Status   Specimen Description BLOOD SITE NOT SPECIFIED  Final   Special Requests   Final    BOTTLES DRAWN AEROBIC AND ANAEROBIC Blood Culture adequate volume   Culture   Final    NO GROWTH 5 DAYS Performed at [facility], [address] [place], [place] [address]    Report Status 06/20/2020 FINAL  Final  Culture, blood (routine x 2)     Status: None   Collection Time: 06/15/20  8:56 PM   Specimen: BLOOD  Result Value Ref Range Status   Specimen Description BLOOD SITE NOT SPECIFIED  Final   Special Requests   Final    BOTTLES DRAWN AEROBIC AND ANAEROBIC Blood Culture adequate volume   Culture   Final    NO GROWTH 5 DAYS Performed at [facility], [address], [place], [place] [address]    Report Status 06/20/2020 FINAL  Final  SARS Coronavirus 2 by RT PCR (hospital order, performed in [facility] hospital lab) Nasopharyngeal Nasopharyngeal Swab     Status: None   Collection Time: 06/15/20  9:03 PM   Specimen: Nasopharyngeal Swab  Result Value Ref Range Status   SARS Coronavirus 2 NEGATIVE NEGATIVE Final    Comment: (NOTE) SARS-CoV-2 target nucleic acids are NOT DETECTED.  The SARS-CoV-2 RNA is generally detectable in upper and lower respiratory specimens during the acute phase of infection. The lowest concentration of SARS-CoV-2 viral copies this assay can detect is 250 copies / mL. A negative result does not preclude SARS-CoV-2 infection and should not be used as the sole basis for treatment or other patient management decisions.  A negative result may occur with improper specimen collection / handling, submission of specimen other than nasopharyngeal swab, presence of viral mutation(s) within the areas targeted by this assay, and inadequate number of viral copies (<250 copies / mL). A negative result must be combined with clinical observations, patient history, and epidemiological information.  Fact Sheet for Patients:   BoilerBrush.com.cy  Fact Sheet for Healthcare Providers: https://pope.com/  This test is not yet approved or  cleared by the [place] FDA and has been authorized for detection and/or diagnosis of  SARS-CoV-2 by FDA under an Emergency Use Authorization (EUA).  This EUA will remain in effect (meaning this test can be used) for the duration of the COVID-19 declaration under Section 564(b)(1) of the Act, 21 U.S.C. section 360bbb-3(b)(1), unless the authorization is terminated or revoked sooner.  Performed at [facility], [address], [place], [place] [address]   Culture, Urine     Status: Abnormal   Collection Time: 06/16/20 12:38 AM   Specimen: Urine, Random  Result Value Ref Range Status   Specimen Description URINE, RANDOM  Final   Special Requests NONE  Final   Culture (A)  Final    <10,000 COLONIES/mL INSIGNIFICANT GROWTH Performed at [facility], [address], [place], [place] [address]    Report Status 06/17/2020 FINAL  Final  MRSA PCR Screening     Status: None   Collection Time:  06/21/20 12:28 AM   Specimen: Nasal Mucosa; Nasopharyngeal  Result Value Ref Range Status   MRSA by PCR NEGATIVE NEGATIVE Final    Comment:        The GeneXpert MRSA Assay (FDA approved for NASAL specimens only), is one component of a comprehensive MRSA colonization surveillance program. It is not intended to diagnose MRSA infection nor to guide or monitor treatment for MRSA infections. Performed at [facility], [address] [place], [place] [address]      Labs: BNP (last 3 results) Recent Labs    12/16/19 1454 04/20/20 1536 06/15/20 1954  BNP 330.9* 152.4* 60.3   Basic Metabolic Panel: Recent Labs  Lab 06/17/20 0150 06/18/20 0552 06/19/20 0748 06/20/20 0711 06/21/20 0726  NA 135 136 138 135 138  K 4.9 4.6 4.6 4.2 4.3  CL 96* 100 103 102 103  CO2 GLUCOSE 76 89 84 81 83  BUN 26* CREATININE 2.39* 2.12* 1.88* 1.66* 1.56*  CALCIUM 9.9 9.2 9.3 9.4 9.7  MG 2.2  --   --   --   --    Liver Function Tests: Recent Labs  Lab 06/17/20 0150 06/18/20 0552 06/19/20 0748 06/20/20 0711 06/21/20 0726  AST 42* 52* 55*  36 41  ALT 44 53* 71* 59* 59*  ALKPHOS 82 85 83 78 87  BILITOT 6.5* 3.5* 3.1* 3.6* 2.8*  PROT 7.8 6.1* 6.3* 6.1* 6.8  ALBUMIN 4.0 3.1* 3.1* 3.1* 3.5   Recent Labs  Lab 06/16/20 1522  LIPASE 69*   No results for input(s): AMMONIA in the last 168 hours. CBC: Recent Labs  Lab 06/16/20 1522 06/16/20 1522 06/17/20 0150 06/18/20 0552 06/19/20 0748 06/20/20 0711 06/21/20 0726  WBC 8.0   < > 7.5 6.1 5.6 5.4 6.0  NEUTROABS 5.2  --  4.3  --   --   --   --   HGB 18.3*   < > 17.9* 15.2 15.3 14.9 15.3  HCT 57.1*   < > 55.2* 46.1 46.9 45.6 46.5  MCV 91.7   < > 91.1 89.9 91.6 89.4 90.1  PLT 190   < > 211 176 150 149* 215   < > = values in this interval not displayed.   Cardiac Enzymes: No results for input(s): CKTOTAL, CKMB, CKMBINDEX, TROPONINI in the last 168 hours. BNP: Invalid input(s): POCBNP CBG: Recent Labs  Lab 06/15/20 1858  GLUCAP 127*   D-Dimer No results for input(s): DDIMER in the last 72 hours. Hgb A1c No results for input(s): HGBA1C in the last 72 hours. Lipid Profile No results for input(s): CHOL, HDL, LDLCALC, TRIG, CHOLHDL, LDLDIRECT in the last 72 hours. Thyroid function studies No results for input(s): TSH, T4TOTAL, T3FREE, THYROIDAB in the last 72 hours.  Invalid input(s): FREET3 Anemia work up No results for input(s): VITAMINB12, FOLATE, FERRITIN, TIBC, IRON, RETICCTPCT in the last 72 hours. Urinalysis    Component Value Date/Time   COLORURINE AMBER (A) 06/16/2020 0106   APPEARANCEUR HAZY (A) 06/16/2020 0106   LABSPEC 1.016 06/16/2020 0106   PHURINE 5.0 06/16/2020 0106   GLUCOSEU >=500 (A) 06/16/2020 0106   HGBUR NEGATIVE 06/16/2020 0106   BILIRUBINUR NEGATIVE 06/16/2020 0106   KETONESUR 5 (A) 06/16/2020 0106   PROTEINUR 30 (A) 06/16/2020 0106   NITRITE NEGATIVE 06/16/2020 0106   LEUKOCYTESUR NEGATIVE 06/16/2020 0106   Sepsis Labs Invalid input(s): PROCALCITONIN,  WBC,  LACTICIDVEN Microbiology Recent Results (from the past  240 hour(s))   Culture, blood (routine x 2)     Status: None   Collection Time: 06/15/20  7:54 PM   Specimen: BLOOD  Result Value Ref Range Status   Specimen Description BLOOD SITE NOT SPECIFIED  Final   Special Requests   Final    BOTTLES DRAWN AEROBIC AND ANAEROBIC Blood Culture adequate volume   Culture   Final    NO GROWTH 5 DAYS Performed at [facility], [address] [place], [place] [address]    Report Status 06/20/2020 FINAL  Final  Culture, blood (routine x 2)     Status: None   Collection Time: 06/15/20  8:56 PM   Specimen: BLOOD  Result Value Ref Range Status   Specimen Description BLOOD SITE NOT SPECIFIED  Final   Special Requests   Final    BOTTLES DRAWN AEROBIC AND ANAEROBIC Blood Culture adequate volume   Culture   Final    NO GROWTH 5 DAYS Performed at [facility], [address] [place], [place] [address]    Report Status 06/20/2020 FINAL  Final  SARS Coronavirus 2 by RT PCR (hospital order, performed in [facility] hospital lab) Nasopharyngeal Nasopharyngeal Swab     Status: None   Collection Time: 06/15/20  9:03 PM   Specimen: Nasopharyngeal Swab  Result Value Ref Range Status   SARS Coronavirus 2 NEGATIVE NEGATIVE Final    Comment: (NOTE) SARS-CoV-2 target nucleic acids are NOT DETECTED.  The SARS-CoV-2 RNA is generally detectable in upper and lower respiratory specimens during the acute phase of infection. The lowest concentration of SARS-CoV-2 viral copies this assay can detect is 250 copies / mL. A negative result does not preclude SARS-CoV-2 infection and should not be used as the sole basis for treatment or other patient management decisions.  A negative result may occur with improper specimen collection / handling, submission of specimen other than nasopharyngeal swab, presence of viral mutation(s) within the areas targeted by this assay, and inadequate number of viral copies (<250 copies / mL). A negative result must be combined with  clinical observations, patient history, and epidemiological information.  Fact Sheet for Patients:   BoilerBrush.com.cy  Fact Sheet for Healthcare Providers: https://pope.com/  This test is not yet approved or  cleared by the [place] FDA and has been authorized for detection and/or diagnosis of SARS-CoV-2 by FDA under an Emergency Use Authorization (EUA).  This EUA will remain in effect (meaning this test can be used) for the duration of the COVID-19 declaration under Section 564(b)(1) of the Act, 21 U.S.C. section 360bbb-3(b)(1), unless the authorization is terminated or revoked sooner.  Performed at [facility], [address], [place], [place] [address]   Culture, Urine     Status: Abnormal   Collection Time: 06/16/20 12:38 AM   Specimen: Urine, Random  Result Value Ref Range Status   Specimen Description URINE, RANDOM  Final   Special Requests NONE  Final   Culture (A)  Final    <10,000 COLONIES/mL INSIGNIFICANT GROWTH Performed at [facility], [address], [place], [place] [address]    Report Status 06/17/2020 FINAL  Final  MRSA PCR Screening     Status: None   Collection Time: 06/21/20 12:28 AM   Specimen: Nasal Mucosa; Nasopharyngeal  Result Value Ref Range Status   MRSA by PCR NEGATIVE NEGATIVE Final    Comment:        The GeneXpert MRSA Assay (FDA approved  for NASAL specimens only), is one component of a comprehensive MRSA colonization surveillance program. It is not intended to diagnose MRSA infection nor to guide or monitor treatment for MRSA infections. Performed at [facility], [address], [place], [place] [address]      Time coordinating discharge: Over 30 minutes  SIGNED:   Burke Keels, MD  [facility] 06/21/2020, 11:18 AM Pager   If 7PM-7AM, please contact night-coverage [email or web address] Password [email or web address]

## 2020-06-21 NOTE — Progress Notes (Signed)
Pt given D/C education and all questions answered. No printed prescriptions to give or equipment to deliver. IVs removed, Pt taken to car with all belongings.

## 2020-06-22 ENCOUNTER — Telehealth (HOSPITAL_COMMUNITY): Payer: Self-pay | Admitting: *Deleted

## 2020-06-22 LAB — H. PYLORI ANTIBODY, IGG: H Pylori IgG: 0.53 Index Value (ref 0.00–0.79)

## 2020-06-22 NOTE — Telephone Encounter (Signed)
Pt left VM stating he wanted Dr.Bensimhon to be aware of medication changes from his hospital admission. He was discharged yesterday and torsemide and carvedilol had been decreased. Carvedilol is now 3.125mg  twice daily from 12.5mg  twice daily. Torsemide is now 5mg  daily from 20mg  every other day. Pt wanted to know if Dr.Bensimhon agreed with theses changes.   Routed to Dr.Bensimhon

## 2020-06-23 LAB — CYTOLOGY - NON PAP

## 2020-06-23 NOTE — Progress Notes (Signed)
Called patient about his upcoming EGD with Dr Levora Angel on 06/30/20. He informs Clinical research associate that he had this as an inpatient last week. He wants to find out what his next steps are. He is instructed to call Dr Rich Brave office to find this out.

## 2020-06-25 ENCOUNTER — Telehealth (HOSPITAL_COMMUNITY): Payer: Self-pay

## 2020-06-25 NOTE — Telephone Encounter (Signed)
Patient called to inquire about his sleep study. He reports that he was suppose to have it done a long time ago but never received any information on it. Upon further investigation, stop bang questions were asked and an order was placed at his last appointment  (04/20/20) but no other documentation.Please advise.

## 2020-06-26 ENCOUNTER — Other Ambulatory Visit (HOSPITAL_COMMUNITY): Payer: 59

## 2020-06-28 NOTE — Telephone Encounter (Signed)
Thanks. Lets try to schedule a televisit soon if he doesn't have a clinic visit coming up.

## 2020-06-30 ENCOUNTER — Ambulatory Visit (HOSPITAL_COMMUNITY): Admission: RE | Admit: 2020-06-30 | Payer: 59 | Source: Home / Self Care | Admitting: Gastroenterology

## 2020-06-30 ENCOUNTER — Encounter (HOSPITAL_COMMUNITY): Admission: RE | Payer: Self-pay | Source: Home / Self Care

## 2020-06-30 SURGERY — ESOPHAGOGASTRODUODENOSCOPY (EGD) WITH PROPOFOL
Anesthesia: Monitor Anesthesia Care

## 2020-07-17 ENCOUNTER — Telehealth: Payer: Self-pay | Admitting: *Deleted

## 2020-07-17 NOTE — Telephone Encounter (Signed)
Returned call: Bradley Powell will handle ordering the sleep study and just have Dr Mayford Knife to read it. Patient has been notified via his cell phone.

## 2020-07-17 NOTE — Telephone Encounter (Signed)
Patient is returning call to discuss arranging a sleep study.

## 2020-07-17 NOTE — Telephone Encounter (Signed)
Patient of Dr [name] called asking to be set up with a sleep study. I informed him the ordering provider's nurse should send me a staff message with all the information. I reached out to [employer] who will handle ordering the sleep study and just have Dr Mayford Knife to read it. Patient has been notified via his cell phone.

## 2020-07-22 ENCOUNTER — Ambulatory Visit (HOSPITAL_COMMUNITY)
Admission: RE | Admit: 2020-07-22 | Discharge: 2020-07-22 | Disposition: A | Discharge status: Home / Self Care | Payer: 59 | Source: Ambulatory Visit | Attending: Internal Medicine | Admitting: Internal Medicine

## 2020-07-22 ENCOUNTER — Other Ambulatory Visit: Payer: Self-pay

## 2020-07-22 VITALS — BP 115/75 | HR 78 | Wt 291.6 lb

## 2020-07-22 DIAGNOSIS — I428 Other cardiomyopathies: Secondary | ICD-10-CM | POA: Diagnosis present

## 2020-07-22 DIAGNOSIS — I5042 Chronic combined systolic (congestive) and diastolic (congestive) heart failure: Secondary | ICD-10-CM | POA: Insufficient documentation

## 2020-07-22 DIAGNOSIS — G4733 Obstructive sleep apnea (adult) (pediatric): Secondary | ICD-10-CM

## 2020-07-22 DIAGNOSIS — I5022 Chronic systolic (congestive) heart failure: Secondary | ICD-10-CM | POA: Diagnosis not present

## 2020-07-22 DIAGNOSIS — I4891 Unspecified atrial fibrillation: Secondary | ICD-10-CM

## 2020-07-22 LAB — BASIC METABOLIC PANEL
Anion gap: 12 (ref 5–15)
BUN: 15 mg/dL (ref 6–20)
CO2: 22 mmol/L (ref 22–32)
Calcium: 9.3 mg/dL (ref 8.9–10.3)
Chloride: 105 mmol/L (ref 98–111)
Creatinine, Ser: 1.23 mg/dL (ref 0.61–1.24)
GFR calc Af Amer: >60 mL/min (ref >60)
GFR calc non Af Amer: >60 mL/min (ref >60)
Glucose, Bld: 94 mg/dL (ref 70–99)
Potassium: 4.2 mmol/L (ref 3.5–5.1)
Sodium: 139 mmol/L (ref 135–145)

## 2020-07-22 LAB — BRAIN NATRIURETIC PEPTIDE: B Natriuretic Peptide: 280.4 pg/mL — ABNORMAL HIGH (ref 0.0–100.0)

## 2020-07-22 MED ORDER — TORSEMIDE 10 MG PO TABS: 10.0000 mg | ORAL_TABLET | Freq: Every day | ORAL | 6 refills | Status: DC

## 2020-07-22 NOTE — Progress Notes (Signed)
Blood collected for Cardiomyopathy genetic testing per Dr [name].  Order form completed and both shipped by FedEx to [facility].

## 2020-07-22 NOTE — Progress Notes (Signed)
Advanced Heart Failure Clinic Note   Date:  07/22/2020   ID:  Bradley Powell, DOB 09-Nov-1981, MRN [id number]  Location: Home  Provider location: [facility] Type of Visit: Established patient  PCP:  [name], MD  Cardiologist:  No primary care provider on file. Primary HF: Bartholomew Ramesh  Chief Complaint: Heart Failure follow-up   History of Present Illness:  Bradley Powell is a 39 y.o. male with a history of combined systolic/diastolic heart failure,  obesity, HTN, and gout.   Admitted 4/13 through 03/07/17 with marked volume overload. EF 40-45%. Diuresed with IV lasix tid + metolazone. Had RHC/LHC as noted below. Overall diuresed 45 pounds. Discharge weight was 281 pounds.   He underwent atrial flutter ablation on 04/06/18.  At last visit he had marked 1st AV block with progression to junctional rhythm. Saw Dr. Ladona Ridgel who wanted to follow it. Suggested cutting back carvedilol.  Previously Bidil stopped due to hypotension.  Admitted in 8/21 with intractable n/v of unclear etiology, hypotension and AKI. Creatinine peaked at 4.5.Back to 1.6 at d/c.  cMRI 7/21 EF 45% suspect infiltrative CM. ECV 55%  Here for routine f/u. Feels ok. Starting back on his walking program walking 1 - 1.5 miles per day. No bleeding with Eliquis. Torsemide cut back to 5mg  daily. + edema. No orthopnea or PND.   Studies  cMRI 7/21 1. Findings suggestive of infiltrative cardiomyopathy. Native T1 ECV 55%, with diffuse post contrast delayed myocardial enhancement and abnormal T1 myocardial nulling kinetics, consistent with a diagnosis of cardiac amyloidosis. 2.  Upper limit of normal biventricular chamber size. 3. Mildly reduced biventricular systolic function. LVEF 45%, RVEF 44%. 4. Cannot exclude possible anomalous pulmonary venous drainage in to the mid SVC. Seen on axial HASTE image series 3 image 4, but evaluation limited due to poor spatial resolution.  Consider ECG gated CTA chest for further evaluation.  Echo 2/21L EF 40-45%   Echo 1/20: EF 35-40%  Echo 9/18 EF ~25-30%.  Echo 3/19: EF 45-50%  Holter 7/18: 6% PVCs  10/18 Sleep study AHI 26/hr  RHC 2/21 RA = 14 RV = 43/15 PA = 43/21 (31) PCW = 15 Fick cardiac output/index = 5.7/2.4 PVR = 2.8 WU Ao sat = 97% PA sat = 73%, 75% SVC sat = 67%   R/LHC 03/01/2017  AO = 109/86 (97) LV =  101/28 RA =  29 RV = 50/22 PA = 55/24 (38) PCW = 26 Fick cardiac output/index = 6.4/2.6 Thermal CO/CI = 4.9/2.0 PVR = 2.5 WU FA sat = 98% PA sat = 69%, 71% SVC sat = 82%, 82% IVC sat = 66% Assessment: 1. Normal coronary arteries  2. Mild to moderate pulmonary HTN with normal PVR 3. Biventricular volume overload with R > L heart failure 4. Moderately decreased CO by thermodilution  5. 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 No intra-cardiac shunt   Past Medical History:  Diagnosis Date  . Atrial flutter (HCC)    a. s/p ablation 03/2018.  04/2018 Chronic combined systolic and diastolic CHF (congestive heart failure) (HCC)   . History of esophagogastroduodenoscopy (EGD)   . Morbid obesity (HCC)   . NICM (nonischemic cardiomyopathy) (HCC)   . PVC's (premature ventricular contractions)   . Sleep apnea    Past Surgical History:  Procedure Laterality Date  . A-FLUTTER ABLATION N/A 04/06/2018   Procedure: A-FLUTTER ABLATION;  Surgeon: Marinus Maw, MD;  Location: [facility] INVASIVE CV LAB;  Service: Cardiovascular;  Laterality: N/A;  . ESOPHAGEAL BRUSHING  06/19/2020   Procedure: ESOPHAGEAL BRUSHING;  Surgeon: Charlott Rakes, MD;  Location: [facility] ENDOSCOPY;  Service: Endoscopy;;  . ESOPHAGOGASTRODUODENOSCOPY (EGD) WITH PROPOFOL Left 06/19/2020   Procedure: ESOPHAGOGASTRODUODENOSCOPY (EGD) WITH PROPOFOL;  Surgeon: Charlott Rakes, MD;  Location: [facility] ENDOSCOPY;  Service: Endoscopy;  Laterality: Left;  . NO PAST  SURGERIES    . RIGHT HEART CATH N/A 12/20/2019   Procedure: RIGHT HEART CATH;  Surgeon: Dolores Patty, MD;  Location: [facility] INVASIVE CV LAB;  Service: Cardiovascular;  Laterality: N/A;  . RIGHT/LEFT HEART CATH AND CORONARY ANGIOGRAPHY N/A 03/01/2017   Procedure: Right/Left Heart Cath and Coronary Angiography;  Surgeon: Dolores Patty, MD;  Location: [facility] INVASIVE CV LAB;  Service: Cardiovascular;  Laterality: N/A;     Current Outpatient Medications  Medication Sig Dispense Refill  . acetaminophen (TYLENOL) 500 MG tablet Take 1,000 mg by mouth every 6 (six) hours as needed for moderate pain or headache.     . allopurinol (ZYLOPRIM) 100 MG tablet Take 2 tablets (200 mg total) by mouth daily. Please contact PCP for further refills 60 tablet 0  . apixaban (ELIQUIS) 5 MG TABS tablet Take 1 tablet (5 mg total) by mouth 2 (two) times daily. 60 tablet 6  . carvedilol (COREG) 3.125 MG tablet Take 3.125 mg by mouth 2 (two) times daily with a meal.    . empagliflozin (JARDIANCE) 10 MG TABS tablet Take 10 mg by mouth daily before breakfast. 30 tablet 11  . MITIGARE 0.6 MG CAPS Take 0.6 mg by mouth daily as needed (gout flare). 30 capsule 5  . ondansetron (ZOFRAN ODT) 4 MG disintegrating tablet Take 1 tablet (4 mg total) by mouth every 8 (eight) hours as needed for nausea or vomiting. 20 tablet 0  . pantoprazole (PROTONIX) 40 MG tablet Take 40 mg by mouth daily.    . promethazine (PROMETHEGAN) 12.5 MG suppository Place 1 suppository (12.5 mg total) rectally every 6 (six) hours as needed for nausea or vomiting. 12 each 0  . spironolactone (ALDACTONE) 25 MG tablet Take 0.5 tablets (12.5 mg total) by mouth daily. 15 tablet 0  . torsemide (DEMADEX) 5 MG tablet Take 5 mg by mouth daily.     No current facility-administered medications for this encounter.    Allergies:   Entresto [sacubitril-valsartan]   Social History:  The patient  reports that he has never smoked. He has never used smokeless tobacco. He  reports current alcohol use. He reports that he does not use drugs.   Family History:  The patient's family history includes CVA in his mother; Gout in his father; Hypertension in his father; Multiple sclerosis in his mother; Seizures in his mother.   ROS:  Please see the history of present illness.   All other systems are personally reviewed and negative.   Vitals:   07/22/20 1512  BP: 115/75  Pulse: 78  SpO2: 97%  Weight: 132.3 kg (291 lb 9.6 oz)    Exam:   General:  Well appearing. No resp difficulty HEENT: normal Neck: supple. JVP 7 Carotids 2+ bilat; no bruits. No lymphadenopathy or thryomegaly appreciated. Cor: PMI nondisplaced. Irregular rate & rhythm. No rubs, gallops or murmurs. Lungs: clear Abdomen: obese soft, nontender, nondistended. No hepatosplenomegaly. No bruits or masses. Good bowel sounds. Extremities: no cyanosis, clubbing, rash, edema Neuro: alert & orientedx3, cranial nerves grossly intact. moves all 4 extremities w/o  difficulty. Affect pleasant  ECG: AF 60 bpm Personally reviewed   Recent Labs: 06/15/2020: B Natriuretic Peptide 60.3 06/17/2020: Magnesium 2.2 06/21/2020: ALT 59; BUN 7; Creatinine, Ser 1.56; Hemoglobin 15.3; Platelets 215; Potassium 4.3; Sodium 138  Personally reviewed   Wt Readings from Last 3 Encounters:  07/22/20 132.3 kg (291 lb 9.6 oz)  06/21/20 125.2 kg (276 lb 0.3 oz)  04/29/20 133.4 kg (294 lb)      ASSESSMENT AND PLAN: 1. Combined Systolic/Diastolic Heart Failure- ECHO 02/22/2017 EF 40-45% Grade II DD. Peak PA pressure 46 mmhg. Echo 9/18 EF ~20-25%. Echo 01/2018: EF 40-45%  - Echo 1/20  EF 35-40% with mild RV dysfunction RVSP 40 mmHG - Echo (12/16/19): EF 40-45% Moderate RV dysfunction  - cMRI 8/21 EF 45% + infiltrative process suggestive of possible amyloidosis - Recent hospitalization 8/21 with volume depletion and AKI. HF meds cut back. - Stable NYHA II - Volume status mildly elevated. Increase torsemide to 10 daily - On  carvedilol 3.125 mg BID. Previously cut back with conduction system disease and bradycardia. - On spiro 12.5 mg daily.  - On Jardiance 10 - Off Bidil with hypotension. - Off losartan due to AKI  - Has been following with Dr. Jomarie Longs for Genetics Counseling. Felt to have characteristics for a genetic CM but not to have pathogenic variant for HCM. Enrolling in the Cardiomyopathy Study through [place].  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 Recheck genetic testing and PYP scan. May need endomyocardial biopsy.    2. Possible cirrhosis, possible cardiac in nature - suspect related to chronic RV failure in setting of OSA/OHS. +/- fatty liver - recent RHC with only minimally elevated PA pressures (much lower than I would suspect given his degree of cirrhosis on MRI) - following with [facility]  - No change  3. Sleep Apnea  - wearing CPAP but says machine is very old and thinks he needs a new device. Unable to do a download.  - awaiting repeat sleep study  4. Pulmonary HTN - RV strain on echo  - RHC in 2/21 minimal PAH with low PAPI - Stressed need for weight loss and continued CPAP compliance  5. Morbid Obesity: - Body mass index is 44.34 kg/m.  - Consider [facility] die  6. PVCs  - ? If cardiomyopathy in part PVC-related. 24-hour monitor  7/18  PVCs 6% -  NSR with AV dissociation by EKG previously. EP as below.   6. Paroxysmal atrial fib/Atrial flutter  - s/p a flutter ablation 04/06/18 with Dr. Ladona Ridgel - Previous EKG NSR with AV dissociation and junctional rhythm at 77 bpm. Has seen Dr. Ladona Ridgel. Carvedilol decreased due to conduction disease and bradycardia - On Eliquis. No bleeding  Signed, Arvilla Meres, MD  07/22/2020 3:23 PM  Advanced Heart Failure Clinic [facility] [address] Heart and Vascular [place] [address] [phone] (office) [phone] (fax)

## 2020-07-22 NOTE — Patient Instructions (Signed)
Increase Torsemide to 10 mg Daily  Labs done today, your results will be available in MyChart, we will contact you for abnormal readings.  Genetic test has been done, this has to be sent to [place] to be processed and can take 1-2 weeks to get results back.  We will let you know the results.  You have been ordered a PYP Scan.  This is done in the Radiology Department of [facility].  When you come for this test please plan to be there 2-3 hours.  Your physician has recommended that you have a sleep study. This test records several body functions during sleep, including: brain activity, eye movement, oxygen and carbon dioxide blood levels, heart rate and rhythm, breathing rate and rhythm, the flow of air through your mouth and nose, snoring, body muscle movements, and chest and belly movement.  Your physician recommends that you schedule a follow-up appointment in: 3 months  If you have any questions or concerns before your next appointment please send [place] a message through [place] or call our office at [phone].    TO LEAVE A MESSAGE FOR THE NURSE SELECT OPTION 2, PLEASE LEAVE A MESSAGE INCLUDING: . YOUR NAME . DATE OF BIRTH . CALL BACK NUMBER . REASON FOR CALL**this is important as we prioritize the call backs  YOU WILL RECEIVE A CALL BACK THE SAME DAY AS LONG AS YOU CALL BEFORE 4:00 PM  At the [facility] Failure Clinic, you and your health needs are our priority. As part of our continuing mission to provide you with exceptional heart care, we have created designated Provider Care Teams. These Care Teams include your primary Cardiologist (physician) and Advanced Practice Providers (APPs- Physician Assistants and Nurse Practitioners) who all work together to provide you with the care you need, when you need it.   You may see any of the following providers on your designated Care Team at your next follow up: Marland Kitchen Dr Arvilla Meres . Dr Marca Ancona . [name],  NP . Robbie Lis, PA . Karle Plumber, PharmD   Please be sure to bring in all your medications bottles to every appointment.

## 2020-07-24 LAB — MULTIPLE MYELOMA PANEL, SERUM
Albumin SerPl Elph-Mcnc: 3.5 g/dL (ref 2.9–4.4)
Albumin/Glob SerPl: 1.1 (ref 0.7–1.7)
Alpha 1: 0.3 g/dL (ref 0.0–0.4)
Alpha2 Glob SerPl Elph-Mcnc: 0.9 g/dL (ref 0.4–1.0)
B-Globulin SerPl Elph-Mcnc: 1.1 g/dL (ref 0.7–1.3)
Gamma Glob SerPl Elph-Mcnc: 1 g/dL (ref 0.4–1.8)
Globulin, Total: 3.2 g/dL (ref 2.2–3.9)
IgA: 249 mg/dL (ref 90–386)
IgG (Immunoglobin G), Serum: 888 mg/dL (ref 603–1613)
IgM (Immunoglobulin M), Srm: 48 mg/dL (ref 20–172)
Total Protein ELP: 6.7 g/dL (ref 6.0–8.5)

## 2020-07-27 ENCOUNTER — Telehealth (HOSPITAL_COMMUNITY): Payer: Self-pay

## 2020-07-27 NOTE — Telephone Encounter (Signed)
Patients sleep study was approved 08/06/20-10/15/20. They did not provide an [place] #.   Case #[id number]

## 2020-08-26 ENCOUNTER — Other Ambulatory Visit: Payer: Self-pay

## 2020-08-26 ENCOUNTER — Ambulatory Visit (HOSPITAL_BASED_OUTPATIENT_CLINIC_OR_DEPARTMENT_OTHER): Payer: 59 | Attending: Internal Medicine | Admitting: Cardiology

## 2020-08-26 DIAGNOSIS — I509 Heart failure, unspecified: Secondary | ICD-10-CM | POA: Insufficient documentation

## 2020-08-26 DIAGNOSIS — Z6841 Body Mass Index (BMI) 40.0 and over, adult: Secondary | ICD-10-CM | POA: Diagnosis not present

## 2020-08-26 DIAGNOSIS — G4733 Obstructive sleep apnea (adult) (pediatric): Secondary | ICD-10-CM

## 2020-08-26 DIAGNOSIS — Z79899 Other long term (current) drug therapy: Secondary | ICD-10-CM | POA: Insufficient documentation

## 2020-08-26 DIAGNOSIS — I4891 Unspecified atrial fibrillation: Secondary | ICD-10-CM | POA: Diagnosis not present

## 2020-09-01 NOTE — Procedures (Signed)
   Patient Name: Bradley Powell, Bradley Powell Date: 08/26/2020 Gender: Male D.O.B: 1981-05-24 Age (years): 38 Referring Provider: Bevelyn Buckles Bensimhon Height (inches): 68 Interpreting Physician: Armanda Magic MD, ABSM Weight (lbs): 270 RPSGT: [place], [place] BMI: 41 MRN: [id number] Neck Size: 16.50  CLINICAL INFORMATION Sleep Study Type: NPSG  Indication for sleep study: Congestive Heart Failure, Morbid Obesity  Epworth Sleepiness Score: 8  SLEEP STUDY TECHNIQUE As per the AASM Manual for the Scoring of Sleep and Associated Events v2.3 (April 2016) with a hypopnea requiring 4% desaturations.  The channels recorded and monitored were frontal, central and occipital EEG, electrooculogram (EOG), submentalis EMG (chin), nasal and oral airflow, thoracic and abdominal wall motion, anterior tibialis EMG, snore microphone, electrocardiogram, and pulse oximetry.  MEDICATIONS Medications self-administered by patient taken the night of the study : CARVEDILOL, LOSARTAN, SPIRONOLACTONE  SLEEP ARCHITECTURE The study was initiated at 10:15:35 PM and ended at 4:53:47 AM.  Sleep onset time was 69.0 minutes and the sleep efficiency was 48.9%. The total sleep time was 194.7 minutes.  Stage REM latency was 275.5 minutes.  The patient spent 30.56% of the night in stage N1 sleep, 68.16% in stage N2 sleep, 0.00% in stage N3 and 1.3% in REM.  Alpha intrusion was absent.  Supine sleep was 29.39%.  RESPIRATORY PARAMETERS The overall apnea/hypopnea index (AHI) was 7.7 per hour. There were 1 total apneas, including 1 obstructive, 0 central and 0 mixed apneas. There were 24 hypopneas and 13 RERAs.  The AHI during Stage REM sleep was 0.0 per hour.  AHI while supine was 7.3 per hour.  The mean oxygen saturation was 93.81%. The minimum SpO2 during sleep was 88.00%.  loud snoring was noted during this study.  CARDIAC DATA The 2 lead EKG demonstrated atrial fibrillation. The mean heart rate was 60.52  beats per minute. Other EKG findings include: PVC  LEG MOVEMENT DATA The total PLMS were 0 with a resulting PLMS index of 0.00. Associated arousal with leg movement index was 0.0 .  IMPRESSIONS - Mild obstructive sleep apnea occurred during this study (AHI = 7.7/h). - No significant central sleep apnea occurred during this study (CAI = 0.0/h). - The patient had minimal or no oxygen desaturation during the study (Min O2 = 88.00%) - The patient snored with loud snoring volume. - EKG findings include PVCs and atrial fibrillation - Clinically significant periodic limb movements did not occur during sleep. No significant associated arousals.  DIAGNOSIS - Obstructive Sleep Apnea (G47.33) - Atrial Fibrillation   RECOMMENDATIONS - Recommend ResMed auto CPAP from 4 to 15cm H2O with heated humidity and mask of choice.  - Avoid alcohol, sedatives and other CNS depressants that may worsen sleep apnea and disrupt normal sleep architecture. - Sleep hygiene should be reviewed to assess factors that may improve sleep quality. - Weight management and regular exercise should be initiated or continued if appropriate.  [Electronically signed] 09/01/2020 09:50 AM  Armanda Magic MD, ABSM Diplomate, American Board of Sleep Medicine

## 2020-09-02 ENCOUNTER — Telehealth: Payer: Self-pay

## 2020-09-02 NOTE — Telephone Encounter (Signed)
-----   Message from Quintella Reichert, MD sent at 09/01/2020  9:53 AM EDT ----- Please let patient know that they have sleep apnea and recommend auto CPAP titration through DME  Orders have been placed in Epic. Please set 8 week OV with me.

## 2020-09-02 NOTE — Telephone Encounter (Signed)
Informed patient of sleep study results and patient understanding was verbalized. Patient understands her sleep study showed they have sleep apnea and recommend auto CPAP titration through DME Orders have been placed in Epic. Please set 8 week OV with me.   Upon patient request DME selection is [facility]. Patient understands she/he will be contacted by [facility] to set up her/he cpap. Patient understands to call if [facility] does not contact her/he with new setup in a timely manner. Patient understands they will be called once confirmation has been received from [facility] that they have received their new machine to schedule 10 week follow up appointment.   [facility] notified of new cpap order  Please add to airview Patient was grateful for the call and thanked me. 

## 2020-09-07 ENCOUNTER — Encounter (HOSPITAL_COMMUNITY): Payer: Self-pay | Admitting: Radiology

## 2020-09-07 ENCOUNTER — Other Ambulatory Visit (HOSPITAL_COMMUNITY): Payer: Self-pay | Admitting: Gastroenterology

## 2020-09-07 ENCOUNTER — Other Ambulatory Visit: Payer: Self-pay | Admitting: Gastroenterology

## 2020-09-07 DIAGNOSIS — K7469 Other cirrhosis of liver: Secondary | ICD-10-CM

## 2020-09-07 DIAGNOSIS — R1112 Projectile vomiting: Secondary | ICD-10-CM

## 2020-09-07 DIAGNOSIS — R7989 Other specified abnormal findings of blood chemistry: Secondary | ICD-10-CM

## 2020-09-07 DIAGNOSIS — R945 Abnormal results of liver function studies: Secondary | ICD-10-CM

## 2020-09-07 NOTE — Progress Notes (Signed)
Bradley Powell. [place] Male, 39 y.o., 1981-01-14 MRN:  [id number] Phone:  [phone] Judie Petit) PCP:  [name], MD Coverage:  [facility]  RE: US Liver Biopsy Received: Today Bensimhon, Bevelyn Buckles, MD  Servando Salina Ok to hold. Thank you       Previous Messages   ----- Message -----  From: Bradley Powell  Sent: 09/07/2020 12:30 PM EDT  To: Bradley Patty, MD  Subject: US Liver Biopsy                  Good afternoon, Dr. Gala Powell, Dr. Levora Powell is requesting that patient have a Liver Biopsy done. I see in epic that you have patient on Eliquis, I just need to know if it will be okay to hold Eliquis 2 days prior to biopsy.   Thanks Bradley Powell

## 2020-09-11 ENCOUNTER — Other Ambulatory Visit (HOSPITAL_COMMUNITY): Payer: Self-pay | Admitting: Internal Medicine

## 2020-09-15 ENCOUNTER — Other Ambulatory Visit: Payer: Self-pay | Admitting: Radiology

## 2020-09-16 ENCOUNTER — Other Ambulatory Visit: Payer: Self-pay | Admitting: Physician Assistant

## 2020-09-16 ENCOUNTER — Ambulatory Visit (HOSPITAL_COMMUNITY)
Admission: RE | Admit: 2020-09-16 | Discharge: 2020-09-16 | Disposition: A | Discharge status: Home / Self Care | Payer: 59 | Source: Ambulatory Visit | Attending: Gastroenterology | Admitting: Gastroenterology

## 2020-09-16 ENCOUNTER — Other Ambulatory Visit: Payer: Self-pay

## 2020-09-16 DIAGNOSIS — R7989 Other specified abnormal findings of blood chemistry: Secondary | ICD-10-CM

## 2020-09-16 DIAGNOSIS — K7469 Other cirrhosis of liver: Secondary | ICD-10-CM

## 2020-09-16 DIAGNOSIS — I130 Hypertensive heart and chronic kidney disease with heart failure and stage 1 through stage 4 chronic kidney disease, or unspecified chronic kidney disease: Secondary | ICD-10-CM | POA: Insufficient documentation

## 2020-09-16 DIAGNOSIS — K761 Chronic passive congestion of liver: Secondary | ICD-10-CM | POA: Diagnosis not present

## 2020-09-16 DIAGNOSIS — Z6838 Body mass index (BMI) 38.0-38.9, adult: Secondary | ICD-10-CM | POA: Insufficient documentation

## 2020-09-16 DIAGNOSIS — Z7984 Long term (current) use of oral hypoglycemic drugs: Secondary | ICD-10-CM | POA: Diagnosis not present

## 2020-09-16 DIAGNOSIS — I5042 Chronic combined systolic (congestive) and diastolic (congestive) heart failure: Secondary | ICD-10-CM | POA: Insufficient documentation

## 2020-09-16 DIAGNOSIS — I428 Other cardiomyopathies: Secondary | ICD-10-CM | POA: Insufficient documentation

## 2020-09-16 DIAGNOSIS — E669 Obesity, unspecified: Secondary | ICD-10-CM | POA: Insufficient documentation

## 2020-09-16 DIAGNOSIS — N189 Chronic kidney disease, unspecified: Secondary | ICD-10-CM | POA: Insufficient documentation

## 2020-09-16 DIAGNOSIS — I4892 Unspecified atrial flutter: Secondary | ICD-10-CM | POA: Diagnosis not present

## 2020-09-16 DIAGNOSIS — I272 Pulmonary hypertension, unspecified: Secondary | ICD-10-CM | POA: Insufficient documentation

## 2020-09-16 DIAGNOSIS — K7402 Hepatic fibrosis, advanced fibrosis: Secondary | ICD-10-CM | POA: Insufficient documentation

## 2020-09-16 DIAGNOSIS — Z7901 Long term (current) use of anticoagulants: Secondary | ICD-10-CM | POA: Insufficient documentation

## 2020-09-16 DIAGNOSIS — R945 Abnormal results of liver function studies: Secondary | ICD-10-CM

## 2020-09-16 DIAGNOSIS — Z79899 Other long term (current) drug therapy: Secondary | ICD-10-CM | POA: Diagnosis not present

## 2020-09-16 LAB — CBC
HCT: 51.8 % (ref 39.0–52.0)
Hemoglobin: 16.3 g/dL (ref 13.0–17.0)
MCH: 29.1 pg (ref 26.0–34.0)
MCHC: 31.5 g/dL (ref 30.0–36.0)
MCV: 92.3 fL (ref 80.0–100.0)
Platelets: 185 10*3/uL (ref 150–400)
RBC: 5.61 MIL/uL (ref 4.22–5.81)
RDW: 12.5 % (ref 11.5–15.5)
WBC: 7.4 10*3/uL (ref 4.0–10.5)
nRBC: 0 % (ref 0.0–0.2)

## 2020-09-16 LAB — PROTIME-INR
INR: 1.2 (ref 0.8–1.2)
Prothrombin Time: 15 seconds (ref 11.4–15.2)

## 2020-09-16 IMAGING — US US BIOPSY CORE LIVER
1 series · 12 of 12 positions shown · non-contrast
Comparison: none

INDICATION: Chronic CHF, hepatic venous congestion, elevated LFTs.

[Series 1: us biopsy (liver) · 12 of 12 slices shown]
[im 1/12]
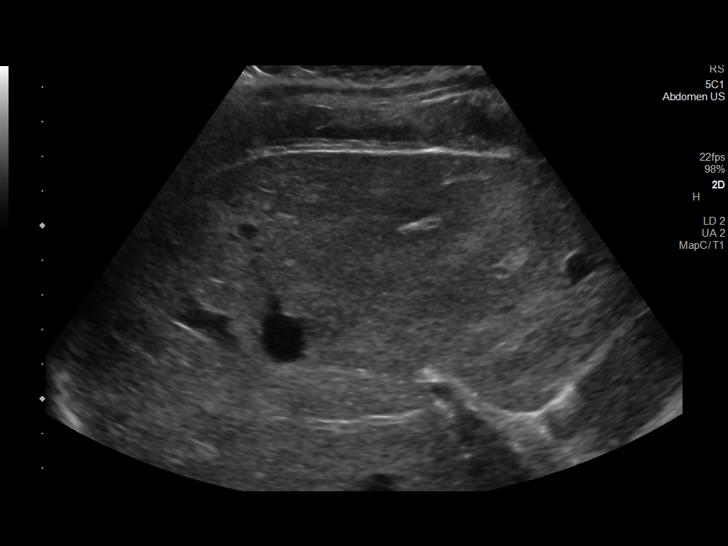
[im 2/12]
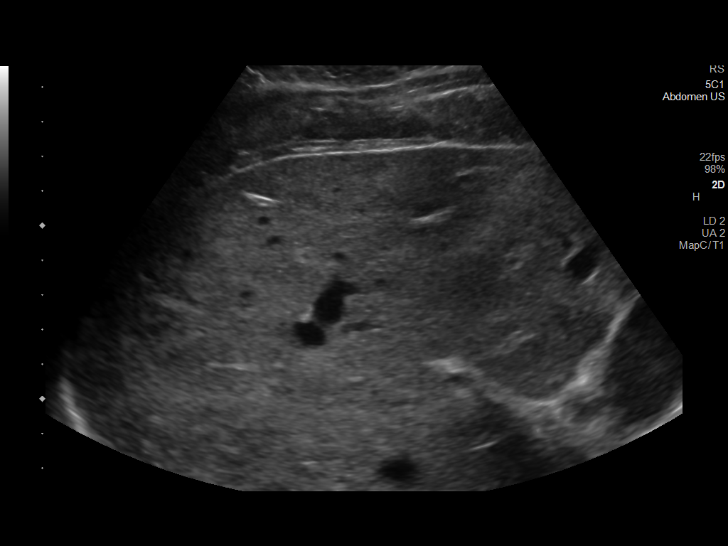
[im 3/12]
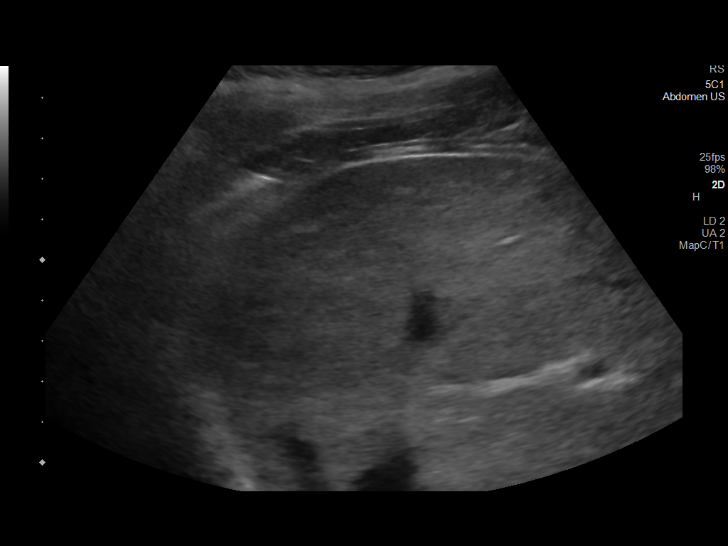
[im 4/12]
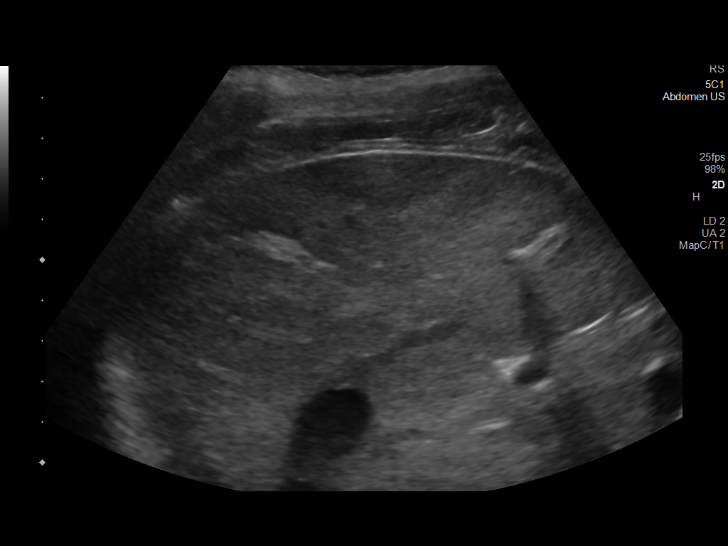
[im 5/12]
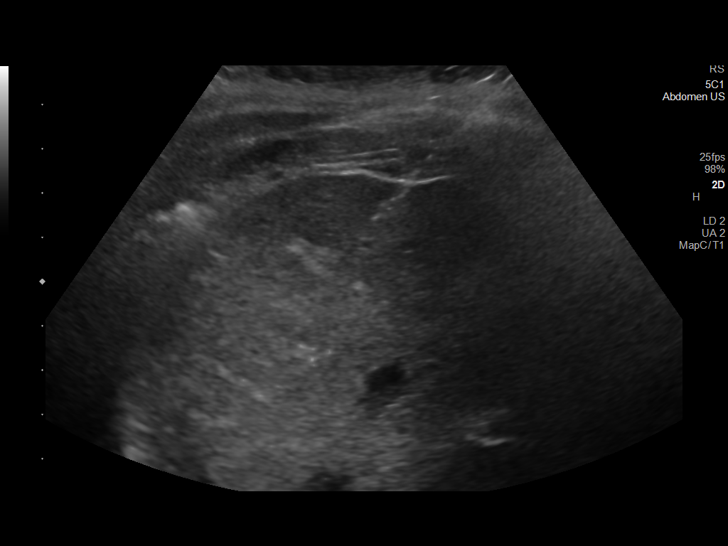
[im 6/12]
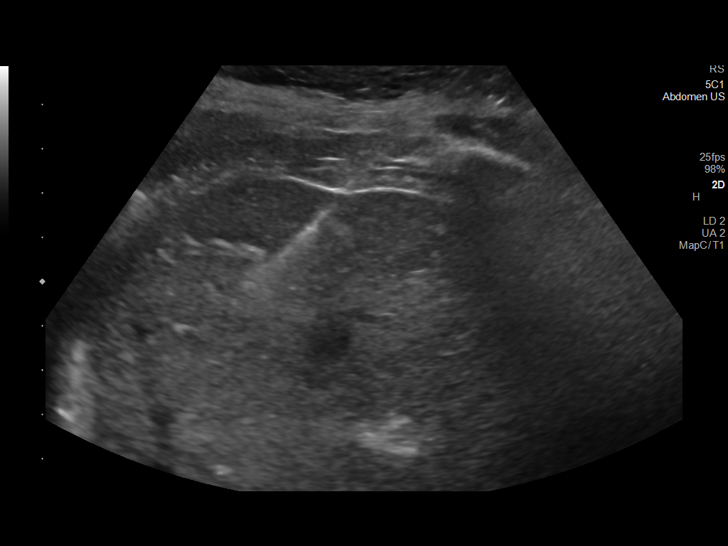
[im 7/12]
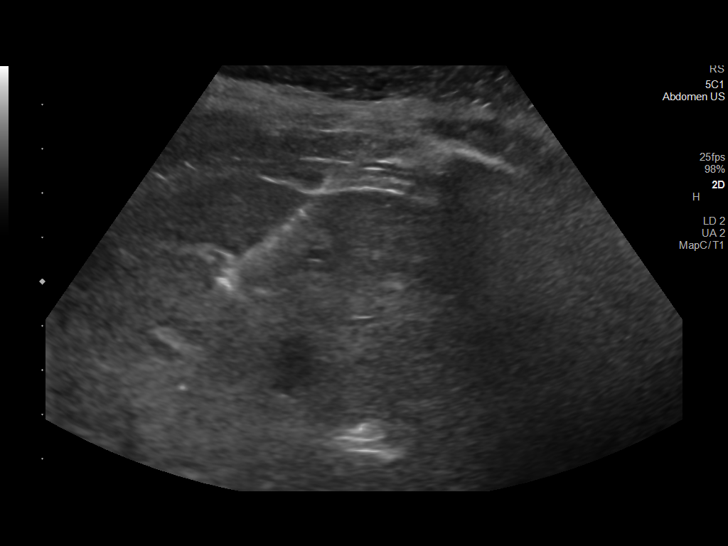
[im 8/12]
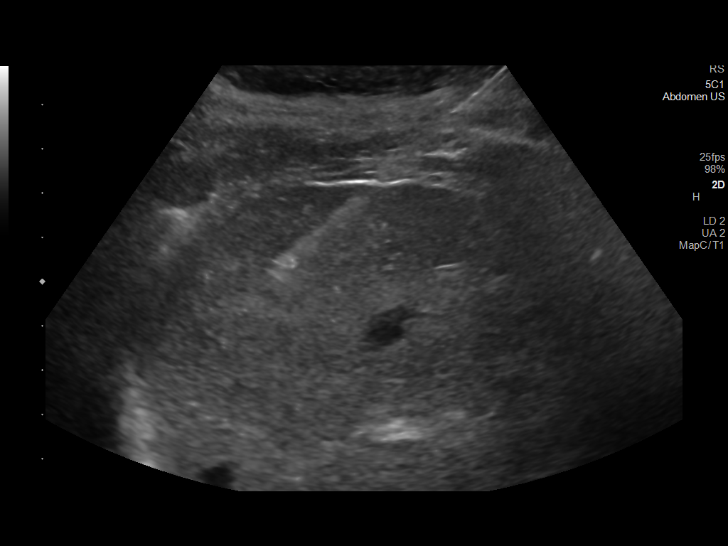
[im 9/12]
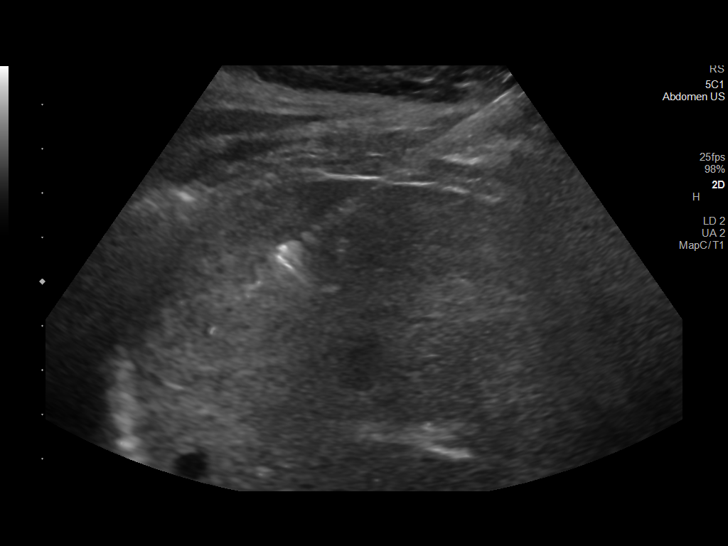
[im 10/12]
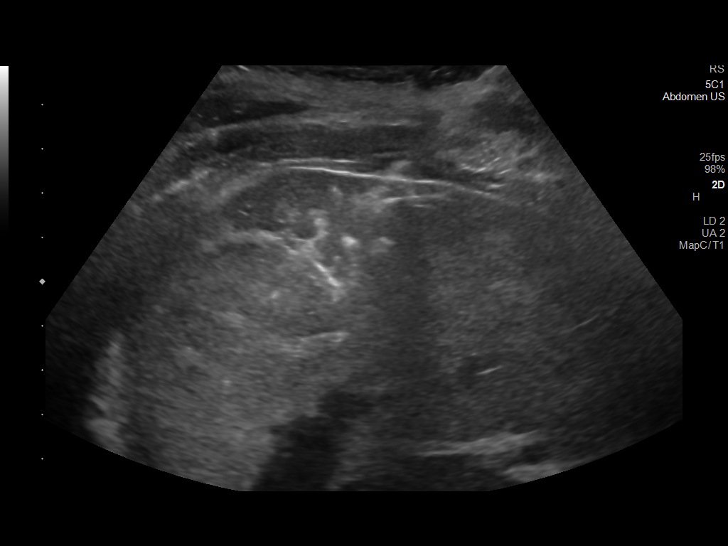
[im 11/12]
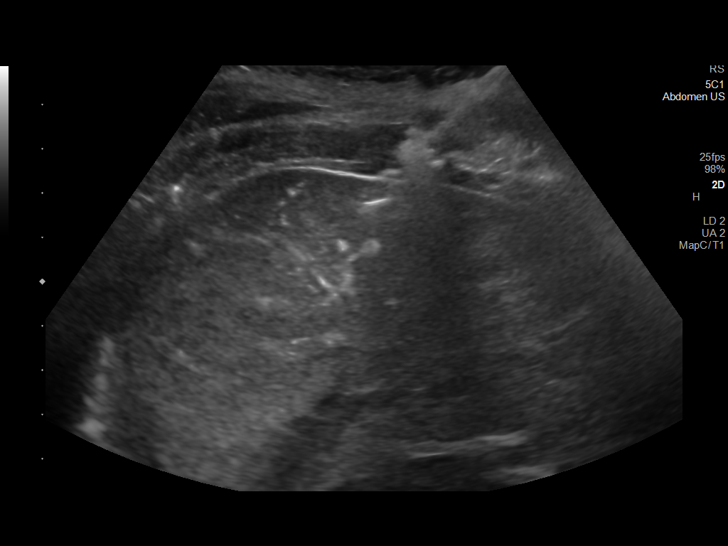
[im 12/12]
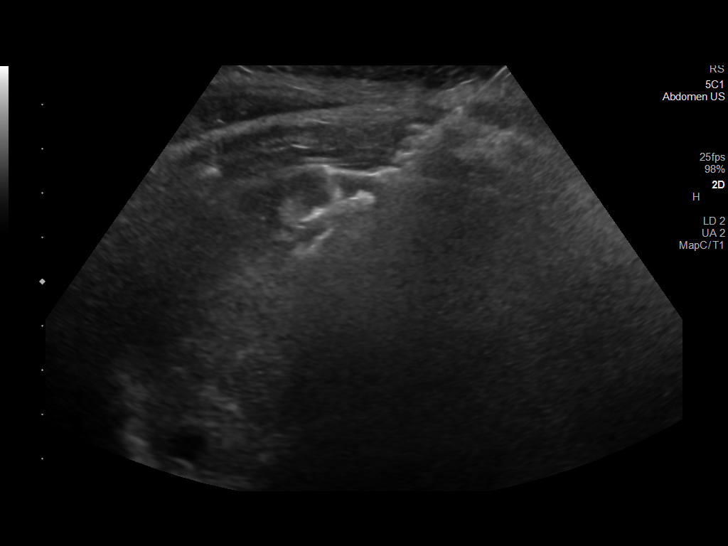

[12 of 12 positions shown; findings below may reference images not displayed]

EXAM:
Ultrasound-guided core biopsy of the liver

MEDICATIONS:
None.

ANESTHESIA/SEDATION:
Fentanyl 50 mcg IV; Versed 2 mg IV

Moderate Sedation Time:  12 minutes

The patient was continuously monitored during the procedure by the
interventional radiology nurse under my direct supervision.

FLUOROSCOPY TIME:  None.

COMPLICATIONS:
None immediate.

PROCEDURE:
Informed consent was obtained from the patient following explanation
of the procedure, risks, benefits and alternatives. The patient
understands, agrees and consents for the procedure. All questions
were addressed. A time out was performed.

The right upper quadrant was interrogated with ultrasound. A
relatively avascular plane of the liver was identified. A suitable
skin entry site was selected and marked. The region was then
sterilely prepped and draped in standard fashion using chlorhexidine
skin prep. Local anesthesia was attained by infiltration with 1%
lidocaine. A small dermatotomy was made.

Under real-time sonographic guidance, a 17 gauge trocar needle was
advanced into the liver. Multiple 18 gauge core biopsies were then
coaxially obtained. Needle placement was confirmed on all biopsy
passes with real-time sonography. Biopsy specimens were placed in
formalin and delivered to pathology for further analysis.

As the introducer needle was removed, the biopsy tract was embolized
with a Gel-Foam slurry. Post biopsy ultrasound imaging demonstrates
no active bleeding or perihepatic hematoma. The patient tolerated
the procedure well.
IMPRESSION: Technically successful ultrasound-guided random core biopsy of the
liver.

## 2020-09-16 MED ORDER — SODIUM CHLORIDE 0.9 % IV SOLN: INTRAVENOUS | Status: DC

## 2020-09-16 MED ORDER — FENTANYL CITRATE (PF) 100 MCG/2ML IJ SOLN
INTRAMUSCULAR | Status: AC | PRN
Start: 2020-09-16 — End: 2020-09-16
  Administered 2020-09-16: 50 ug via INTRAVENOUS

## 2020-09-16 MED ORDER — FENTANYL CITRATE (PF) 100 MCG/2ML IJ SOLN
INTRAMUSCULAR | Status: AC
  Filled 2020-09-16: qty 2

## 2020-09-16 MED ORDER — LIDOCAINE HCL (PF) 1 % IJ SOLN
INTRAMUSCULAR | Status: AC
  Filled 2020-09-16: qty 30

## 2020-09-16 MED ORDER — GELATIN ABSORBABLE 12-7 MM EX MISC
CUTANEOUS | Status: AC
  Filled 2020-09-16: qty 1

## 2020-09-16 MED ORDER — MIDAZOLAM HCL 2 MG/2ML IJ SOLN
INTRAMUSCULAR | Status: AC
  Filled 2020-09-16: qty 2

## 2020-09-16 MED ORDER — MIDAZOLAM HCL 2 MG/2ML IJ SOLN
INTRAMUSCULAR | Status: AC | PRN
  Administered 2020-09-16: 2 mg via INTRAVENOUS

## 2020-09-16 NOTE — Consult Note (Signed)
Chief Complaint: Patient was seen in consultation today for image guided random core liver biopsy  Referring Physician(s): [id number],UJWJX  Supervising Physician: Malachy Moan  Patient Status: [facility] - Out-pt  History of Present Illness: Bradley Powell is a 39 y.o. male with past medical history of PAF/atrial flutter, status post ablation in 2019, CHF, obesity, nonischemic cardiomyopathy, pulmonary hypertension, renal insufficiency who presents now with elevated LFTs as well as possible cirrhosis by imaging.  He is scheduled today for image guided random core liver biopsy for further evaluation.  Past Medical History:  Diagnosis Date  . Atrial flutter (HCC)    a. s/p ablation 03/2018.  Marland Kitchen Chronic combined systolic and diastolic CHF (congestive heart failure) (HCC)   . History of esophagogastroduodenoscopy (EGD)   . Morbid obesity (HCC)   . NICM (nonischemic cardiomyopathy) (HCC)   . PVC's (premature ventricular contractions)   . Sleep apnea     Past Surgical History:  Procedure Laterality Date  . A-FLUTTER ABLATION N/A 04/06/2018   Procedure: A-FLUTTER ABLATION;  Surgeon: Marinus Maw, MD;  Location: [facility] INVASIVE CV LAB;  Service: Cardiovascular;  Laterality: N/A;  . ESOPHAGEAL BRUSHING  06/19/2020   Procedure: ESOPHAGEAL BRUSHING;  Surgeon: Charlott Rakes, MD;  Location: [facility] ENDOSCOPY;  Service: Endoscopy;;  . ESOPHAGOGASTRODUODENOSCOPY (EGD) WITH PROPOFOL Left 06/19/2020   Procedure: ESOPHAGOGASTRODUODENOSCOPY (EGD) WITH PROPOFOL;  Surgeon: Charlott Rakes, MD;  Location: [facility] ENDOSCOPY;  Service: Endoscopy;  Laterality: Left;  . NO PAST SURGERIES    . RIGHT HEART CATH N/A 12/20/2019   Procedure: RIGHT HEART CATH;  Surgeon: Dolores Patty, MD;  Location: [facility] INVASIVE CV LAB;  Service: Cardiovascular;  Laterality: N/A;  . RIGHT/LEFT HEART CATH AND CORONARY ANGIOGRAPHY N/A 03/01/2017   Procedure: Right/Left Heart Cath and Coronary Angiography;  Surgeon: Dolores Patty, MD;  Location: [facility] INVASIVE CV LAB;  Service: Cardiovascular;  Laterality: N/A;    Allergies: Entresto [sacubitril-valsartan]  Medications: Prior to Admission medications   Medication Sig Start Date End Date Taking? Authorizing Provider  allopurinol (ZYLOPRIM) 100 MG tablet Take 2 tablets (200 mg total) by mouth daily. Please contact PCP for further refills 04/10/19  Yes Bensimhon, Bevelyn Buckles, MD  apixaban (ELIQUIS) 5 MG TABS tablet Take 1 tablet (5 mg total) by mouth 2 (two) times daily. 04/20/20  Yes Bensimhon, Bevelyn Buckles, MD  carvedilol (COREG) 3.125 MG tablet Take 3.125 mg by mouth 2 (two) times daily with a meal.   Yes [provider]  empagliflozin (JARDIANCE) 10 MG TABS tablet Take 10 mg by mouth daily before breakfast. 12/18/19  Yes Bensimhon, Bevelyn Buckles, MD  fluconazole (DIFLUCAN) 100 MG tablet Take 100 mg by mouth daily. 09/08/20  Yes [provider]  MITIGARE 0.6 MG CAPS Take 0.6 mg by mouth daily as needed (gout flare). 04/10/19  Yes Bensimhon, Bevelyn Buckles, MD  ondansetron (ZOFRAN ODT) 4 MG disintegrating tablet Take 1 tablet (4 mg total) by mouth every 8 (eight) hours as needed for nausea or vomiting. 06/21/20  Yes Spongberg, Susy Frizzle, MD  pantoprazole (PROTONIX) 40 MG tablet Take 40 mg by mouth daily.   Yes [provider]  promethazine (PROMETHEGAN) 12.5 MG suppository Place 1 suppository (12.5 mg total) rectally every 6 (six) hours as needed for nausea or vomiting. 06/21/20  Yes Spongberg, Susy Frizzle, MD  spironolactone (ALDACTONE) 25 MG tablet Take 0.5 tablets (12.5 mg total) by mouth daily. 06/21/20  Yes Spongberg, Susy Frizzle, MD  sucralfate (CARAFATE) 1 g tablet Take 1 g by  mouth daily.  09/03/20  Yes [provider]  torsemide (DEMADEX) 10 MG tablet Take 1 tablet (10 mg total) by mouth daily. 09/11/20  Yes Bensimhon, Bevelyn Buckles, MD     Family History  Problem Relation Age of Onset  . CVA Mother        pacemaker  . Multiple sclerosis  Mother   . Seizures Mother   . Hypertension Father   . Gout Father     Social History   Socioeconomic History  . Marital status: Single    Spouse name: Not on file  . Number of children: Not on file  . Years of education: Not on file  . Highest education level: Not on file  Occupational History  . Not on file  Tobacco Use  . Smoking status: Never Smoker  . Smokeless tobacco: Never Used  Vaping Use  . Vaping Use: Never used  Substance and Sexual Activity  . Alcohol use: Yes    Comment: rarely  . Drug use: No  . Sexual activity: Not Currently    Partners: Female  Other Topics Concern  . Not on file  Social History Narrative  . Not on file   Social Determinants of Health   Financial Resource Strain:   . Difficulty of Paying Living Expenses: Not on file  Food Insecurity:   . Worried About Programme researcher, broadcasting/film/video in the Last Year: Not on file  . Ran Out of Food in the Last Year: Not on file  Transportation Needs:   . Lack of Transportation (Medical): Not on file  . Lack of Transportation (Non-Medical): Not on file  Physical Activity:   . Days of Exercise per Week: Not on file  . Minutes of Exercise per Session: Not on file  Stress:   . Feeling of Stress : Not on file  Social Connections:   . Frequency of Communication with Friends and Family: Not on file  . Frequency of Social Gatherings with Friends and Family: Not on file  . Attends Religious Services: Not on file  . Active Member of Clubs or Organizations: Not on file  . Attends Banker Meetings: Not on file  . Marital Status: Not on file      Review of Systems currently denies fever, headache, chest pain, dyspnea, cough, domino pain, back pain, or bleeding; he has had some recent intermittent nausea and vomiting and dehydration.  Vital Signs:pend   Physical Exam awake, alert.  Chest clear to auscultation bilaterally.  Heart with nl rate, irreg rhythm.  Abdomen soft, positive bowel sounds,  currently nontender.  No lower extremity edema.  Imaging: SLEEP STUDY DOCUMENTS  Result Date: 08/31/2020 Ordered by an unspecified provider.   Labs:  CBC: Recent Labs    06/18/20 0552 06/19/20 0748 06/20/20 0711 06/21/20 0726  WBC 6.1 5.6 5.4 6.0  HGB 15.2 15.3 14.9 15.3  HCT 46.1 46.9 45.6 46.5  PLT 176 150 149* 215    COAGS: Recent Labs    12/16/19 1454 06/16/20 1843  INR 1.2 1.3*  APTT  --  28    BMP: Recent Labs    06/19/20 0748 06/20/20 0711 06/21/20 0726 07/22/20 1601  NA 138 135 138 139  K 4.6 4.2 4.3 4.2  CL 103 102 103 105  CO2 25 25 26 22   GLUCOSE 84 81 83 94  BUN 13 7 7 15   CALCIUM 9.3 9.4 9.7 9.3  CREATININE 1.88* 1.66* 1.56* 1.23  GFRNONAA 45* 52* 56* >60  GFRAA 52* >60 >60 >60    LIVER FUNCTION TESTS: Recent Labs    06/18/20 0552 06/19/20 0748 06/20/20 0711 06/21/20 0726  BILITOT 3.5* 3.1* 3.6* 2.8*  AST 52* 55* 36 41  ALT 53* 71* 59* 59*  ALKPHOS 85 83 78 87  PROT 6.1* 6.3* 6.1* 6.8  ALBUMIN 3.1* 3.1* 3.1* 3.5    TUMOR MARKERS: No results for input(s): AFPTM, CEA, CA199, CHROMGRNA in the last 8760 hours.  Assessment and Plan:  39 y.o. male with past medical history of PAF/atrial flutter (on eliquis), status post ablation in 2019, CHF, obesity, nonischemic cardiomyopathy, pulmonary hypertension, renal insufficiency who presents now with elevated LFTs as well as possible cirrhosis by imaging.  He is scheduled today for image guided random core liver biopsy for further evaluation.Risks and benefits of procedure was discussed with the patient  including, but not limited to bleeding, infection, damage to adjacent structures or low yield requiring additional tests.  All of the questions were answered and there is agreement to proceed.  Consent signed and in chart.  LABS PENDING; last dose of Eliquis was this past Sunday(10/31)   Thank you for this interesting consult.  I greatly enjoyed meeting JERMANE BRAYBOY and look  forward to participating in their care.  A copy of this report was sent to the requesting provider on this date.  Electronically Signed: D. Jeananne Rama, PA-C 09/16/2020, 11:55 AM   I spent a total of 25 minutes    in face to face in clinical consultation, greater than 50% of which was counseling/coordinating care for image guided random core liver biopsy

## 2020-09-16 NOTE — Progress Notes (Signed)
Called Dr Archer Asa and per Dr Archer Asa client may   Resume eliquis tomorrow; client and his father notified and voiced understanding

## 2020-09-16 NOTE — Procedures (Signed)
Interventional Radiology Procedure Note  Procedure: US guided random liver biopsy  Complications: None  Estimated Blood Loss: None  Recommendations: - Bedrest x 2 hrs - DC home   Signed,  Veleria Barnhardt K. Vernecia Umble, MD    

## 2020-09-16 NOTE — Progress Notes (Signed)
Report given to M.Wright,RN who will assume care at this time

## 2020-09-16 NOTE — Discharge Instructions (Signed)

## 2020-09-17 LAB — SURGICAL PATHOLOGY

## 2020-09-25 ENCOUNTER — Encounter (HOSPITAL_COMMUNITY): Payer: Self-pay

## 2020-09-25 NOTE — Progress Notes (Signed)
Received Medical clearance form from [facility] for dental treatment. Per Dr. [name]: There are no contraindications and patient can safely be treated in the dental setting. Form faxed to [phone]

## 2020-09-28 ENCOUNTER — Encounter (HOSPITAL_COMMUNITY)
Admission: RE | Admit: 2020-09-28 | Discharge: 2020-09-28 | Disposition: A | Discharge status: Home / Self Care | Payer: 59 | Source: Ambulatory Visit | Attending: Gastroenterology | Admitting: Gastroenterology

## 2020-09-28 ENCOUNTER — Ambulatory Visit: Payer: 59 | Attending: Internal Medicine

## 2020-09-28 ENCOUNTER — Other Ambulatory Visit: Payer: Self-pay

## 2020-09-28 DIAGNOSIS — Z23 Encounter for immunization: Secondary | ICD-10-CM

## 2020-09-28 DIAGNOSIS — R1112 Projectile vomiting: Secondary | ICD-10-CM

## 2020-09-28 NOTE — Progress Notes (Signed)
   Covid-19 Vaccination Clinic  Name:  CORKY BLUMSTEIN    MRN: [id number] DOB: [birth year]  09/28/2020  Mr. Hilgers was observed post Covid-19 immunization for 30 minutes based on pre-vaccination screening without incident. He was provided with Vaccine Information Sheet and instruction to access the V-Safe system.   Mr. Perleberg was instructed to call 911 with any severe reactions post vaccine: Marland Kitchen Difficulty breathing  . Swelling of face and throat  . A fast heartbeat  . A bad rash all over body  . Dizziness and weakness   Immunizations Administered    Name Date Dose VIS Date Route   Pfizer COVID-19 Vaccine 09/28/2020  3:05 PM 0.3 mL 09/02/2020 Intramuscular   Manufacturer: [employer], [employer]   Lot: [id number]   NDC: [address]-2

## 2020-09-29 ENCOUNTER — Ambulatory Visit (HOSPITAL_COMMUNITY)
Admission: RE | Admit: 2020-09-29 | Discharge: 2020-09-29 | Disposition: A | Discharge status: Home / Self Care | Payer: 59 | Source: Ambulatory Visit | Attending: Gastroenterology | Admitting: Gastroenterology

## 2020-09-29 DIAGNOSIS — R1112 Projectile vomiting: Secondary | ICD-10-CM | POA: Insufficient documentation

## 2020-09-29 IMAGING — NM NM GASTRIC EMPTYING
2 series · 2 of 2 positions shown · non-contrast
Comparison: None.

CLINICAL DATA: Severe nausea and vomiting for approximately 2
months.

EXAM:
NUCLEAR MEDICINE GASTRIC EMPTYING SCAN
TECHNIQUE: After oral ingestion of radiolabeled meal, sequential abdominal
images were obtained for 120 minutes. Residual percentage of
activity remaining within the stomach was calculated at 60 and 120
minutes.
RADIOPHARMACEUTICALS:  2.0 mCi [SY] sulfur colloid in standardized
meal

[Series 1: 0 min · 4.14mm/px · 1 of 1 slices shown]
[im 1/1]
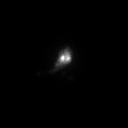

[Series 2: 1 hr · 4.14mm/px · 1 of 1 slices shown]
[im 1/1]
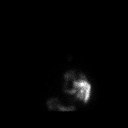

[2 of 2 positions shown; findings below may reference images not displayed]

FINDINGS: Expected location of the stomach in the left upper quadrant.
Ingested meal empties the stomach rapidly over the course of the
study with 1% retention at 60 min and 0% retention at 120 min
(normal retention less than 30% at a 120 min).
IMPRESSION: Normal (and rapid) gastric emptying.

## 2020-09-29 MED ORDER — TECHNETIUM TC 99M SULFUR COLLOID
2.0000 | Freq: Once | INTRAVENOUS | Status: AC | PRN
  Administered 2020-09-29: 2 via INTRAVENOUS

## 2020-11-19 ENCOUNTER — Telehealth: Payer: Self-pay | Admitting: *Deleted

## 2020-11-19 NOTE — Telephone Encounter (Signed)
Patient has a 10 week follow up appointment scheduled for 12/16/2020. Patient understands he needs to keep this appointment for insurance compliance. Patient was grateful for the call and thanked me.

## 2020-11-19 NOTE — Telephone Encounter (Signed)
  Patient Consent for Virtual Visit         Bradley Powell has provided verbal consent on 11/19/2020 for a virtual visit (video or telephone).   CONSENT FOR VIRTUAL VISIT FOR:  Bradley Powell  By participating in this virtual visit I agree to the following:  I hereby voluntarily request, consent and authorize [facility] and its employed or contracted physicians, physician assistants, nurse practitioners or other licensed health care professionals (the Practitioner), to provide me with telemedicine health care services (the "Services") as deemed necessary by the treating Practitioner. I acknowledge and consent to receive the Services by the Practitioner via telemedicine. I understand that the telemedicine visit will involve communicating with the Practitioner through live audiovisual communication technology and the disclosure of certain medical information by electronic transmission. I acknowledge that I have been given the opportunity to request an in-person assessment or other available alternative prior to the telemedicine visit and am voluntarily participating in the telemedicine visit.  I understand that I have the right to withhold or withdraw my consent to the use of telemedicine in the course of my care at any time, without affecting my right to future care or treatment, and that the Practitioner or I may terminate the telemedicine visit at any time. I understand that I have the right to inspect all information obtained and/or recorded in the course of the telemedicine visit and may receive copies of available information for a reasonable fee.  I understand that some of the potential risks of receiving the Services via telemedicine include:  Marland Kitchen Delay or interruption in medical evaluation due to technological equipment failure or disruption; . Information transmitted may not be sufficient (e.g. poor resolution of images) to allow for appropriate medical decision making by the  Practitioner; and/or  . In rare instances, security protocols could fail, causing a breach of personal health information.  Furthermore, I acknowledge that it is my responsibility to provide information about my medical history, conditions and care that is complete and accurate to the best of my ability. I acknowledge that Practitioner's advice, recommendations, and/or decision may be based on factors not within their control, such as incomplete or inaccurate data provided by me or distortions of diagnostic images or specimens that may result from electronic transmissions. I understand that the practice of medicine is not an exact science and that Practitioner makes no warranties or guarantees regarding treatment outcomes. I acknowledge that a copy of this consent can be made available to me via my patient portal [facility] MyChart), or I can request a printed copy by calling the office of [facility].    I understand that my insurance will be billed for this visit.   I have read or had this consent read to me. . I understand the contents of this consent, which adequately explains the benefits and risks of the Services being provided via telemedicine.  . I have been provided ample opportunity to ask questions regarding this consent and the Services and have had my questions answered to my satisfaction. . I give my informed consent for the services to be provided through the use of telemedicine in my medical care

## 2020-11-19 NOTE — Telephone Encounter (Signed)
YOUR CARDIOLOGY TEAM HAS ARRANGED FOR AN E-VISIT FOR YOUR APPOINTMENT - PLEASE REVIEW IMPORTANT INFORMATION BELOW SEVERAL DAYS PRIOR TO YOUR APPOINTMENT  Due to the recent COVID-19 pandemic, we are transitioning in-person office visits to tele-medicine visits in an effort to decrease unnecessary exposure to our patients, their families, and staff. These visits are billed to your insurance just like a normal visit is. We also encourage you to sign up for MyChart if you have not already done so. You will need a smartphone if possible. For patients that do not have this, we can still complete the visit using a regular telephone but do prefer a smartphone to enable video when possible. You may have a family member that lives with you that can help. If possible, we also ask that you have a blood pressure cuff and scale at home to measure your blood pressure, heart rate and weight prior to your scheduled appointment. Patients with clinical needs that need an in-person evaluation and testing will still be able to come to the office if absolutely necessary. If you have any questions, feel free to call our office.     YOUR PROVIDER WILL BE USING THE FOLLOWING PLATFORM TO COMPLETE YOUR VISIT:   . IF USING MYCHART - How to Download the MyChart App to Your SmartPhone   - If Apple, go to App Store and type in MyChart in the search bar and download the app. If Android, ask patient to go to Google Play Store and type in MyChart in the search bar and download the app. The app is free but as with any other app downloads, your phone may require you to verify saved payment information or Apple/Android password.  - You will need to then log into the app with your MyChart username and password, and select [place] as your healthcare provider to link the account.  - When it is time for your visit, go to the MyChart app, find appointments, and click Begin Video Visit. Be sure to Select Allow for your device to access the  Microphone and Camera for your visit. You will then be connected, and your provider will be with you shortly.  **If you have any issues connecting or need assistance, please contact MyChart service desk [phone] ([phone])**  **If using a computer, in order to ensure the best quality for your visit, you will need to use either of the following Internet Browsers: Google Chrome or Microsoft Edge**  . IF USING DOXIMITY or [email or web address].ME - The staff will give you instructions on receiving your link to join the meeting the day of your visit.      THE DAY OF YOUR APPOINTMENT  Approximately 15 minutes prior to your scheduled appointment, you will receive a telephone call from one of [facility] team - your caller ID may say "Unknown caller."  Our staff will confirm medications, vital signs for the day and any symptoms you may be experiencing. Please have this information available prior to the time of visit start. It may also be helpful for you to have a pad of paper and pen handy for any instructions given during your visit. They will also walk you through joining the smartphone meeting if this is a video visit.    CONSENT FOR TELE-HEALTH VISIT - PLEASE REVIEW  I hereby voluntarily request, consent and authorize [facility] and its employed or contracted physicians, physician assistants, nurse practitioners or other licensed health care professionals (the Practitioner), to provide me with telemedicine health care   services (the "Services") as deemed necessary by the treating Practitioner. I acknowledge and consent to receive the Services by the Practitioner via telemedicine. I understand that the telemedicine visit will involve communicating with the Practitioner through live audiovisual communication technology and the disclosure of certain medical information by electronic transmission. I acknowledge that I have been given the opportunity to request an in-person assessment or other available  alternative prior to the telemedicine visit and am voluntarily participating in the telemedicine visit.  I understand that I have the right to withhold or withdraw my consent to the use of telemedicine in the course of my care at any time, without affecting my right to future care or treatment, and that the Practitioner or I may terminate the telemedicine visit at any time. I understand that I have the right to inspect all information obtained and/or recorded in the course of the telemedicine visit and may receive copies of available information for a reasonable fee.  I understand that some of the potential risks of receiving the Services via telemedicine include:  . Delay or interruption in medical evaluation due to technological equipment failure or disruption; . Information transmitted may not be sufficient (e.g. poor resolution of images) to allow for appropriate medical decision making by the Practitioner; and/or  . In rare instances, security protocols could fail, causing a breach of personal health information.  Furthermore, I acknowledge that it is my responsibility to provide information about my medical history, conditions and care that is complete and accurate to the best of my ability. I acknowledge that Practitioner's advice, recommendations, and/or decision may be based on factors not within their control, such as incomplete or inaccurate data provided by me or distortions of diagnostic images or specimens that may result from electronic transmissions. I understand that the practice of medicine is not an exact science and that Practitioner makes no warranties or guarantees regarding treatment outcomes. I acknowledge that I will receive a copy of this consent concurrently upon execution via email to the email address I last provided but may also request a printed copy by calling the office of [facility].    I understand that my insurance will be billed for this visit.   I have read or had  this consent read to me. . I understand the contents of this consent, which adequately explains the benefits and risks of the Services being provided via telemedicine.  . I have been provided ample opportunity to ask questions regarding this consent and the Services and have had my questions answered to my satisfaction. . I give my informed consent for the services to be provided through the use of telemedicine in my medical care  By participating in this telemedicine visit I agree to the above. 

## 2020-12-02 ENCOUNTER — Other Ambulatory Visit: Payer: Self-pay | Admitting: Gastroenterology

## 2020-12-02 DIAGNOSIS — K7460 Unspecified cirrhosis of liver: Secondary | ICD-10-CM

## 2020-12-07 ENCOUNTER — Ambulatory Visit
Admission: RE | Admit: 2020-12-07 | Discharge: 2020-12-07 | Disposition: A | Discharge status: Home / Self Care | Payer: 59 | Source: Ambulatory Visit | Attending: Gastroenterology | Admitting: Gastroenterology

## 2020-12-07 DIAGNOSIS — K7460 Unspecified cirrhosis of liver: Secondary | ICD-10-CM

## 2020-12-07 IMAGING — US US ABDOMEN LIMITED
1 series · 14 of 25 positions shown · non-contrast
Comparison: [DATE]

CLINICAL DATA: Cirrhosis without ascites

EXAM:
ULTRASOUND ABDOMEN LIMITED RIGHT UPPER QUADRANT

[Series 1: us abdomen limited · 0.31mm/px · 14 of 45 slices shown]
[im 1/45]
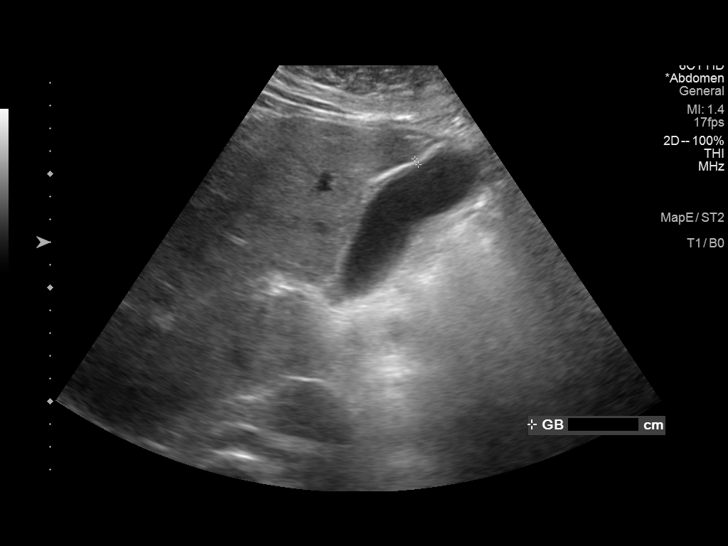
[im 4/45]
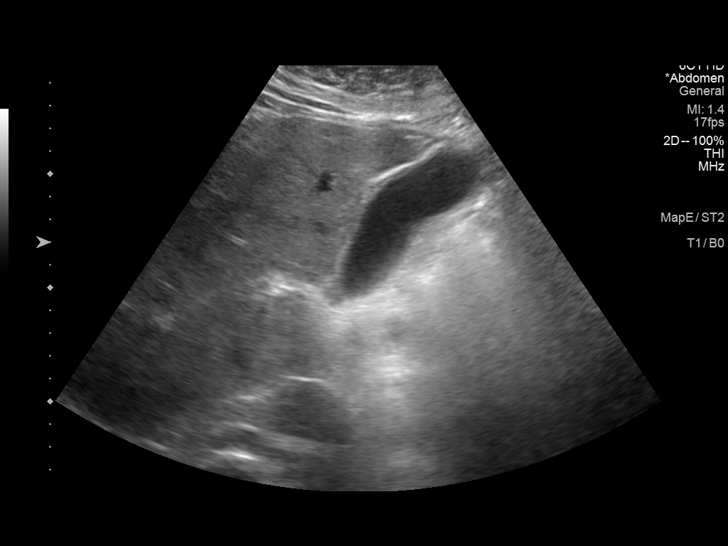
[im 8/45]
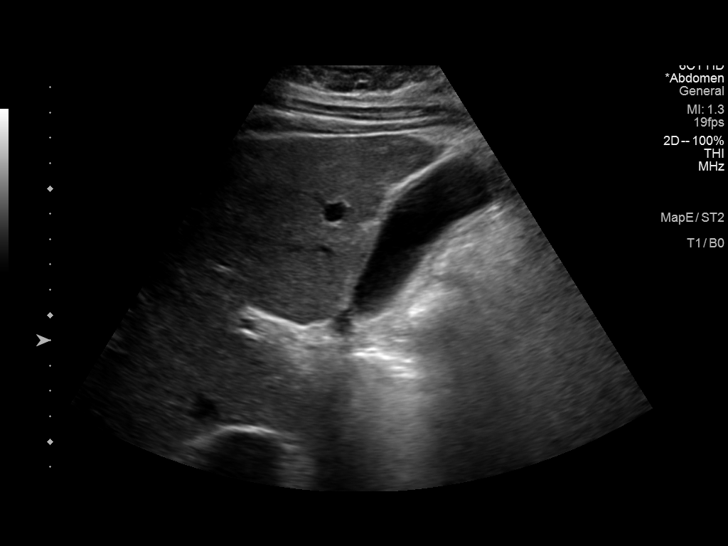
[im 12/45]
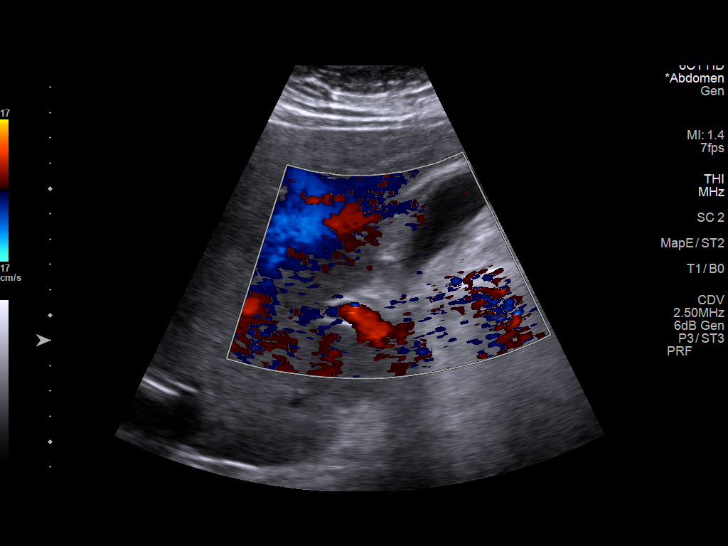
[im 15/45]
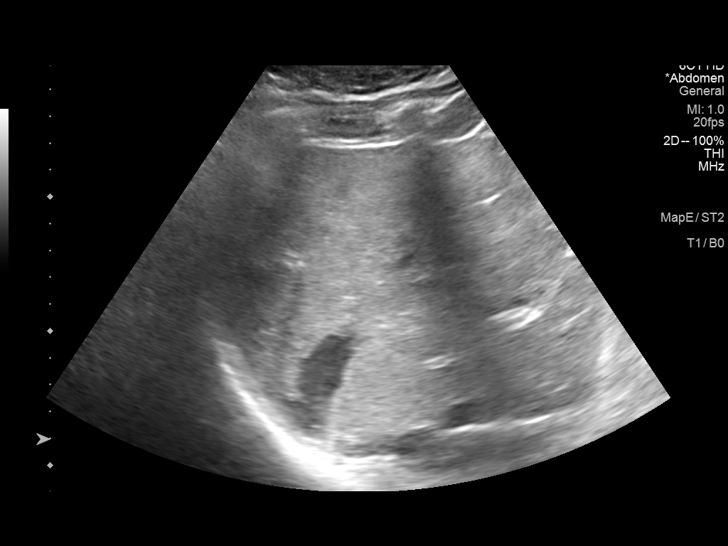
[im 17/45]
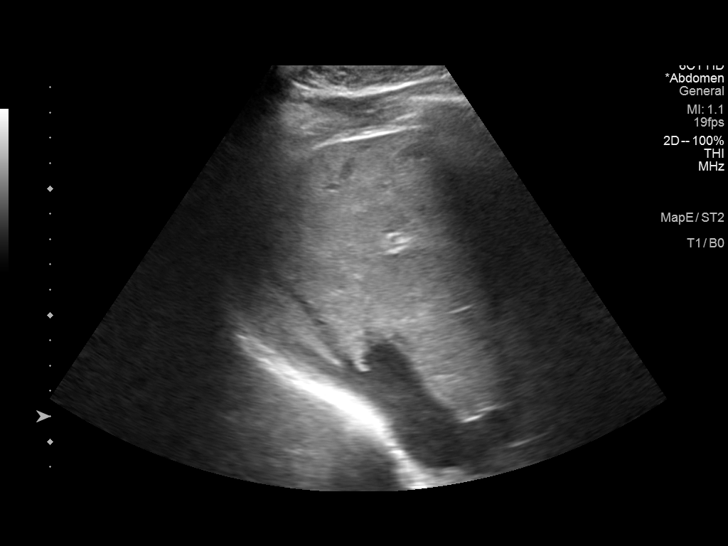
[im 21/45]
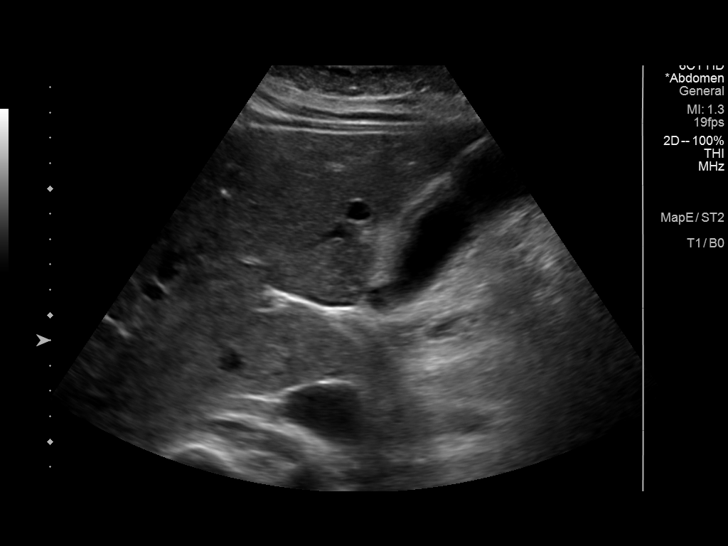
[im 24/45]
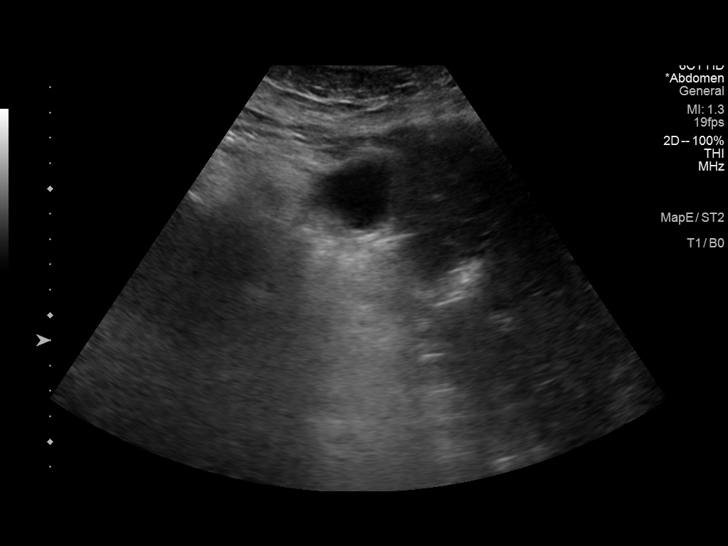
[im 28/45]
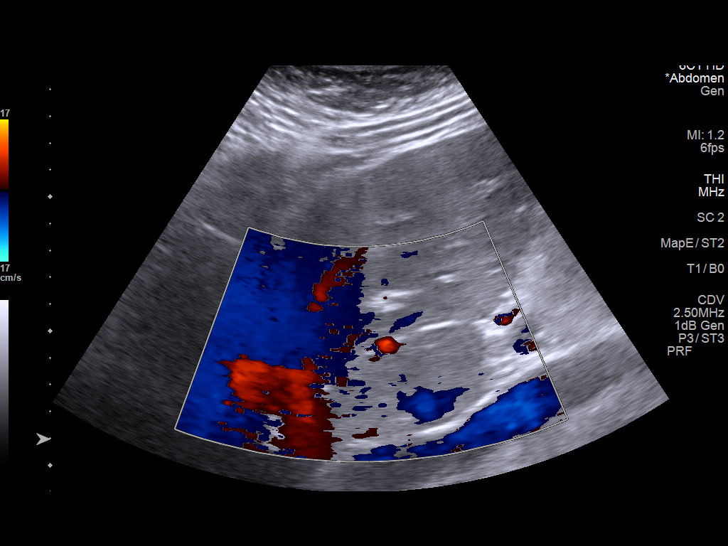
[im 30/45]
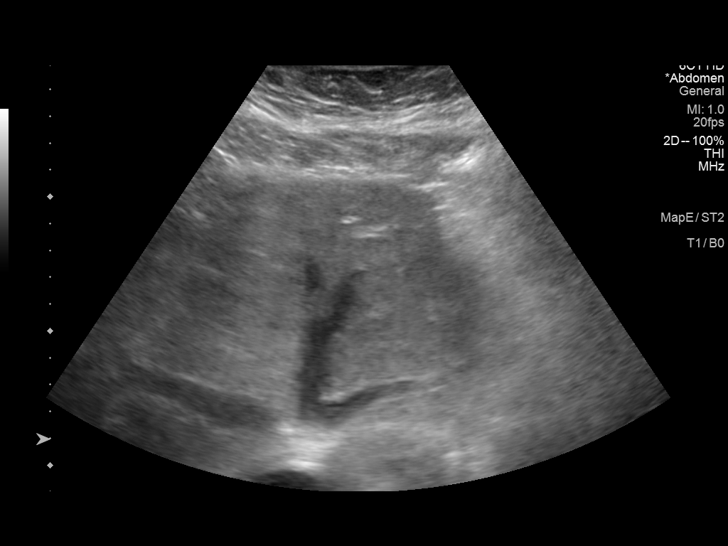
[im 34/45]
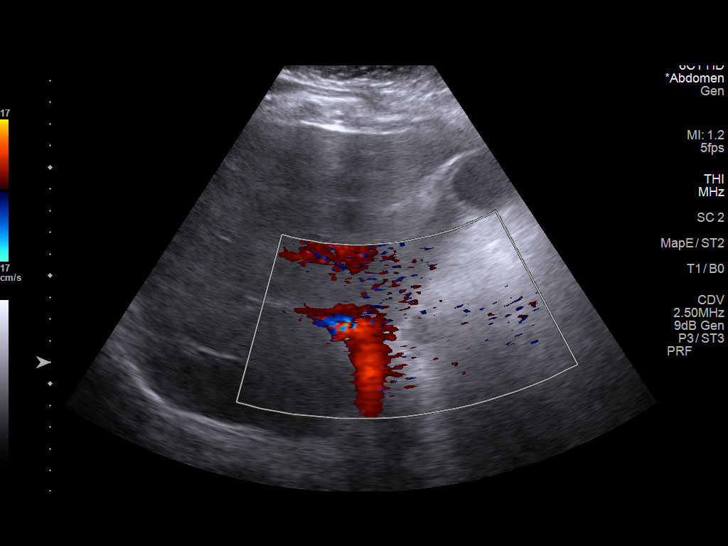
[im 37/45]
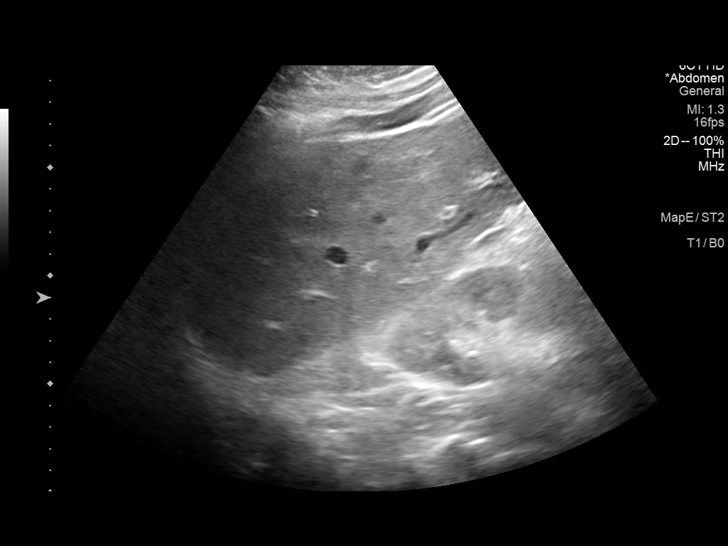
[im 41/45]
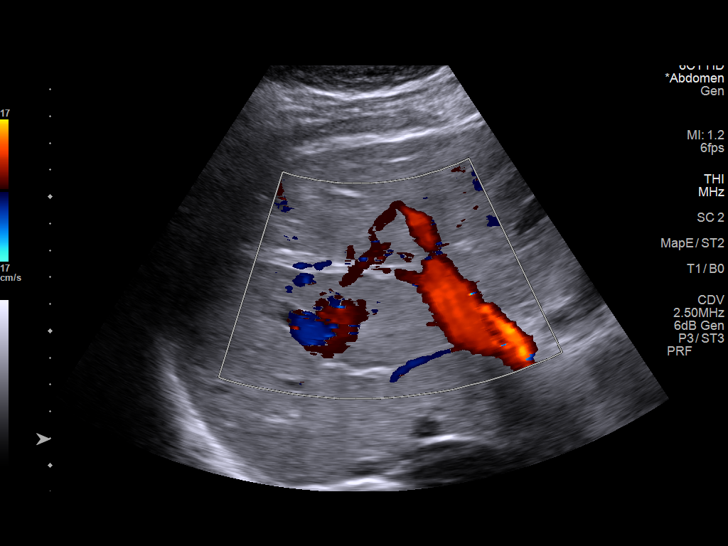
[im 45/45]
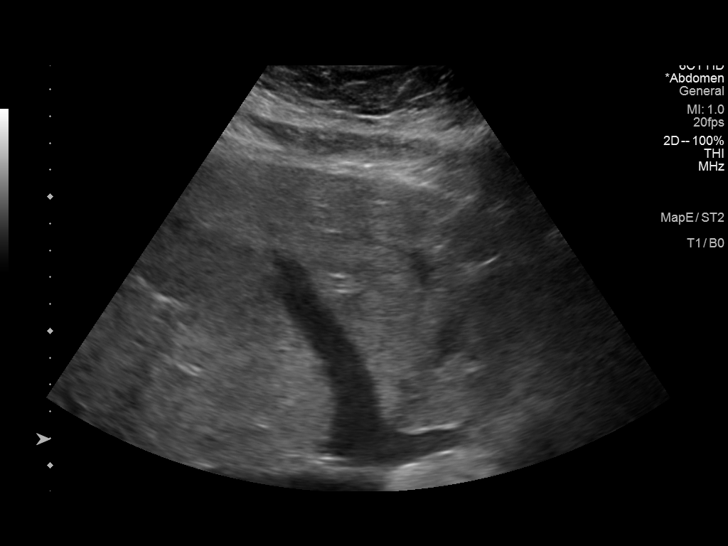

[14 of 25 positions shown; findings below may reference images not displayed]

FINDINGS: Gallbladder:

No gallstones or wall thickening visualized. No sonographic Murphy
sign noted by sonographer.

Common bile duct:

Diameter: 3 mm

Liver:

No focal lesion identified. Within normal limits in parenchymal
echogenicity. Portal vein is patent on color Doppler imaging with
normal direction of blood flow towards the liver.

Other: None.
IMPRESSION: Unremarkable sonographic evaluation of the gallbladder and liver.

## 2020-12-09 ENCOUNTER — Other Ambulatory Visit (HOSPITAL_COMMUNITY): Payer: Self-pay | Admitting: Internal Medicine

## 2020-12-16 ENCOUNTER — Telehealth: Payer: 59 | Admitting: Cardiology

## 2020-12-31 ENCOUNTER — Encounter: Payer: Self-pay | Admitting: Cardiology

## 2020-12-31 ENCOUNTER — Other Ambulatory Visit: Payer: Self-pay

## 2020-12-31 ENCOUNTER — Ambulatory Visit (INDEPENDENT_AMBULATORY_CARE_PROVIDER_SITE_OTHER): Payer: 59 | Admitting: Cardiology

## 2020-12-31 ENCOUNTER — Ambulatory Visit: Payer: 59 | Admitting: Cardiology

## 2020-12-31 ENCOUNTER — Telehealth: Payer: Self-pay | Admitting: *Deleted

## 2020-12-31 VITALS — BP 120/70 | HR 68 | Ht 68.5 in | Wt 310.2 lb

## 2020-12-31 DIAGNOSIS — G4733 Obstructive sleep apnea (adult) (pediatric): Secondary | ICD-10-CM

## 2020-12-31 NOTE — Telephone Encounter (Signed)
-----   Message from Quintella Reichert, MD sent at 12/31/2020  3:35 PM EST ----- Please order patient a longer hose for his PAP and some new filters

## 2020-12-31 NOTE — Progress Notes (Signed)
Cardiology Office Note:    Date:  12/31/2020   ID:  Bradley Powell, DOB 1980/12/17, MRN [id number]  PCP:  [name], MD  Cardiologist:  No primary care provider on file.    Referring MD: [name], MD   Chief Complaint  Patient presents with  . Sleep Apnea    History of Present Illness:    Bradley Powell is a 40 y.o. male with a hx of paroxysmal atrial flutter s/p remote ablation, chronic combined systolic/diastolic CHF, NICM and morbid obesity. He was referred for initial sleep study in 2018 showing moderate OSA with an AHI of 26.7/hr, moderate snoring and intermittent heart block.  He underwent CPAP titration to 13cm H2O but never followed up. He was seen back by Dr. [name] and said that his machine was old and needed a new device.  A repeat sleep study was ordered showing mild OSA with an AHI of 7.7/hr and was placed on auto CPAP from 4 to 15cm H2O.  He is doing well with his CPAP device and thinks that he has gotten used to it.  He tolerates the mask and feels the pressure is adequate.  Since going on CPAP he feels rested in the am and has no significant daytime sleepiness if he sleeps well the night before.  He denies any significant mouth or nasal dryness or nasal congestion.  He does not think that he snores.     Past Medical History:  Diagnosis Date  . Atrial flutter (HCC)    a. s/p ablation 03/2018.  Marland Kitchen Chronic combined systolic and diastolic CHF (congestive heart failure) (HCC)   . History of esophagogastroduodenoscopy (EGD)   . Morbid obesity (HCC)   . NICM (nonischemic cardiomyopathy) (HCC)   . PVC's (premature ventricular contractions)   . Sleep apnea     Past Surgical History:  Procedure Laterality Date  . A-FLUTTER ABLATION N/A 04/06/2018   Procedure: A-FLUTTER ABLATION;  Surgeon: Marinus Maw, MD;  Location: [facility] INVASIVE CV LAB;  Service: Cardiovascular;  Laterality: N/A;  . ESOPHAGEAL BRUSHING  06/19/2020   Procedure: ESOPHAGEAL BRUSHING;   Surgeon: Charlott Rakes, MD;  Location: [facility] ENDOSCOPY;  Service: Endoscopy;;  . ESOPHAGOGASTRODUODENOSCOPY (EGD) WITH PROPOFOL Left 06/19/2020   Procedure: ESOPHAGOGASTRODUODENOSCOPY (EGD) WITH PROPOFOL;  Surgeon: Charlott Rakes, MD;  Location: [facility] ENDOSCOPY;  Service: Endoscopy;  Laterality: Left;  . NO PAST SURGERIES    . RIGHT HEART CATH N/A 12/20/2019   Procedure: RIGHT HEART CATH;  Surgeon: Dolores Patty, MD;  Location: [facility] INVASIVE CV LAB;  Service: Cardiovascular;  Laterality: N/A;  . RIGHT/LEFT HEART CATH AND CORONARY ANGIOGRAPHY N/A 03/01/2017   Procedure: Right/Left Heart Cath and Coronary Angiography;  Surgeon: Dolores Patty, MD;  Location: [facility] INVASIVE CV LAB;  Service: Cardiovascular;  Laterality: N/A;    Current Medications: Current Meds  Medication Sig  . allopurinol (ZYLOPRIM) 100 MG tablet Take 2 tablets (200 mg total) by mouth daily. Please contact PCP for further refills  . carvedilol (COREG) 3.125 MG tablet Take 3.125 mg by mouth 2 (two) times daily with a meal.  . ELIQUIS 5 MG TABS tablet TAKE 1 TABLET BY MOUTH TWICE A DAY  . empagliflozin (JARDIANCE) 10 MG TABS tablet Take 10 mg by mouth daily before breakfast.  . fluconazole (DIFLUCAN) 100 MG tablet Take 100 mg by mouth daily.  Marland Kitchen MITIGARE 0.6 MG CAPS Take 0.6 mg by mouth daily as needed (gout flare).  . ondansetron (ZOFRAN ODT) 4 MG disintegrating tablet Take  1 tablet (4 mg total) by mouth every 8 (eight) hours as needed for nausea or vomiting.  . pantoprazole (PROTONIX) 40 MG tablet Take 40 mg by mouth daily.  . promethazine (PROMETHEGAN) 12.5 MG suppository Place 1 suppository (12.5 mg total) rectally every 6 (six) hours as needed for nausea or vomiting.  Marland Kitchen spironolactone (ALDACTONE) 25 MG tablet Take 0.5 tablets (12.5 mg total) by mouth daily.  . sucralfate (CARAFATE) 1 g tablet Take 1 g by mouth daily.   Marland Kitchen torsemide (DEMADEX) 10 MG tablet Take 1 tablet (10 mg total) by mouth daily.  . Vitamin D,  Ergocalciferol, (DRISDOL) 1.25 MG (50000 UNIT) CAPS capsule Take 50,000 Units by mouth once a week.     Allergies:   Entresto [sacubitril-valsartan]   Social History   Socioeconomic History  . Marital status: Single    Spouse name: Not on file  . Number of children: Not on file  . Years of education: Not on file  . Highest education level: Not on file  Occupational History  . Not on file  Tobacco Use  . Smoking status: Never Smoker  . Smokeless tobacco: Never Used  Vaping Use  . Vaping Use: Never used  Substance and Sexual Activity  . Alcohol use: Yes    Comment: rarely  . Drug use: No  . Sexual activity: Not Currently    Partners: Female  Other Topics Concern  . Not on file  Social History Narrative  . Not on file   Social Determinants of Health   Financial Resource Strain: Not on file  Food Insecurity: Not on file  Transportation Needs: Not on file  Physical Activity: Not on file  Stress: Not on file  Social Connections: Not on file     Family History: The patient's family history includes CVA in his mother; Gout in his father; Hypertension in his father; Multiple sclerosis in his mother; Seizures in his mother.  ROS:   Please see the history of present illness.    ROS  All other systems reviewed and negative.   EKGs/Labs/Other Studies Reviewed:    The following studies were reviewed today: Sleep study and PAP compliance download  EKG:  EKG is not ordered today.    Recent Labs: 06/17/2020: Magnesium 2.2 06/21/2020: ALT 59 07/22/2020: B Natriuretic Peptide 280.4; BUN 15; Creatinine, Ser 1.23; Potassium 4.2; Sodium 139 09/16/2020: Hemoglobin 16.3; Platelets 185   Recent Lipid Panel No results found for: CHOL, TRIG, HDL, CHOLHDL, VLDL, LDLCALC, LDLDIRECT   Physical Exam:    VS:  BP 120/70   Pulse 68   Ht 5' 8.5" (1.74 m)   Wt (!) 310 lb 3.2 oz (140.7 kg)   SpO2 98%   BMI 46.48 kg/m     Wt Readings from Last 3 Encounters:  12/31/20 (!) 310 lb 3.2 oz  (140.7 kg)  09/16/20 260 lb (117.9 kg)  08/26/20 270 lb (122.5 kg)     GEN:  Well nourished, well developed in no acute distress HEENT: Normal NECK: No JVD; No carotid bruits LYMPHATICS: No lymphadenopathy CARDIAC: RRR, no murmurs, rubs, gallops RESPIRATORY:  Clear to auscultation without rales, wheezing or rhonchi  ABDOMEN: Soft, non-tender, non-distended MUSCULOSKELETAL:  No edema; No deformity  SKIN: Warm and dry NEUROLOGIC:  Alert and oriented x 3 PSYCHIATRIC:  Normal affect   ASSESSMENT:    1. OSA (obstructive sleep apnea)   2. Morbid obesity (HCC)    PLAN:    In order of problems listed above:  1.  OSA -The patient is tolerating PAP therapy well without any problems. The PAP download was reviewed today and showed an AHI of 0.4/hr on auto PAP cm H2O with 63% compliance in using more than 4 hours nightly.  The p -I encouraged him to be more compliant with his device and he needs to use it at least 4 hours 70% of time so insurance will not make him hand it back in -I did explain to him that he really needs to use it nightly all night to gain the most benefit for cardiac health>>he has been going to bed half way through the night -he is struggling a little with the length of the tubing and I will try to get him a longer a tube  2.  Morbid Obesity -I have encouraged him to get into a routine exercise program and cut back on carbs and portions.    Medication Adjustments/Labs and Tests Ordered: Current medicines are reviewed at length with the patient today.  Concerns regarding medicines are outlined above.  No orders of the defined types were placed in this encounter.  No orders of the defined types were placed in this encounter.   Signed, Armanda Magic, MD  12/31/2020 3:33 PM    [facility]

## 2020-12-31 NOTE — Patient Instructions (Signed)

## 2021-01-19 NOTE — Progress Notes (Signed)
Advanced Heart Failure Clinic Note   PCP:  [name], MD  Primary HF: Dr. [name]  Chief Complaint: Heart Failure follow-up   History of Present Illness:  Bradley Powell is a 40 y.o. male with a history of combined systolic/diastolic heart failure,  obesity, HTN, and gout.   Admitted 4/13 through 03/07/17 with marked volume overload. EF 40-45%. Diuresed with IV lasix tid + metolazone. Had RHC/LHC as noted below. Overall diuresed 45 pounds. Discharge weight was 281 pounds.   He underwent atrial flutter ablation on 04/06/18.  At last visit he had marked 1st AV block with progression to junctional rhythm. Saw Dr. Ladona Ridgel who wanted to follow it. Suggested cutting back carvedilol.  Previously Bidil stopped due to hypotension.  Admitted in 8/21 with intractable n/v of unclear etiology, hypotension and AKI. Creatinine peaked at 4.5.Back to 1.6 at d/c.  cMRI 7/21 EF 45% suspect infiltrative CM. ECV 55%  Today she returns for HF follow up. Last clinic visit (9/21) volume up and torsemide increased. Now, overall feeling poor. Has been struggling with nausea/vomiting for the past 2 months. Being seen by PCP and GI for work up. Was told he was dehydrated. Vomiting daily despite anti-emetics. Feels light-headed. Denies increasing SOB, CP, dizziness, edema, or PND/Orthopnea. Appetite poor. No fever or chills. Weight at home 283 pounds, but usually 274-275 lbs. Stopped torsemide a few days ago when his vomiting started happening daily. 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  Cardiac Studies  cMRI (7/21): 1. Findings suggestive of infiltrative cardiomyopathy. Native T1 ECV 55%, with diffuse post contrast delayed myocardial enhancement and abnormal T1 myocardial nulling kinetics, consistent with a diagnosis of cardiac amyloidosis. 2.  Upper limit of normal biventricular chamber size. 3. Mildly reduced biventricular systolic function. LVEF 45%, RVEF 44%. 4. Cannot exclude  possible anomalous pulmonary venous drainage in to the mid SVC. Seen on axial HASTE image series 3 image 4, but evaluation limited due to poor spatial resolution. Consider ECG gated CTA chest for further evaluation.  - Echo 2/21: EF 40-45%  - Echo 1/20: EF 35-40% - Echo 9/18: EF ~25-30%.  - Echo 3/19: EF 45-50%  - Holter 7/18: 6% PVCs - Sleep study (10/18): AHI 26/hr  - RHC (2/21): RA = 14 RV = 43/15 PA = 43/21 (31) PCW = 15 Fick cardiac output/index = 5.7/2.4 PVR = 2.8 WU Ao sat = 97% PA sat = 73%, 75% SVC sat = 67%  - R/LHC (4/18): AO = 109/86 (97) LV =  101/28 RA =  29 RV = 50/22 PA = 55/24 (38) PCW = 26 Fick cardiac output/index = 6.4/2.6 Thermal CO/CI = 4.9/2.0 PVR = 2.5 WU FA sat = 98% PA sat = 69%, 71% SVC sat = 82%, 82% IVC sat = 66% Assessment: 1. Normal coronary arteries  2. Mild to moderate pulmonary HTN with normal PVR 3. Biventricular volume overload with R > L heart failure 4. Moderately decreased CO by thermodilution  5. 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 No intra-cardiac shunt   Past Medical History:  Diagnosis Date  . Atrial flutter (HCC)    a. s/p ablation 03/2018.  Marland Kitchen Chronic combined systolic and diastolic CHF (congestive heart failure) (HCC)   . History of esophagogastroduodenoscopy (EGD)   . Morbid obesity (HCC)   . NICM (nonischemic cardiomyopathy) (HCC)   . PVC's (premature ventricular contractions)   . Sleep apnea  Past Surgical History:  Procedure Laterality Date  . A-FLUTTER ABLATION N/A 04/06/2018   Procedure: A-FLUTTER ABLATION;  Surgeon: Marinus Maw, MD;  Location: [facility] INVASIVE CV LAB;  Service: Cardiovascular;  Laterality: N/A;  . ESOPHAGEAL BRUSHING  06/19/2020   Procedure: ESOPHAGEAL BRUSHING;  Surgeon: Charlott Rakes, MD;  Location: [facility] ENDOSCOPY;  Service: Endoscopy;;  . ESOPHAGOGASTRODUODENOSCOPY (EGD) WITH PROPOFOL Left  06/19/2020   Procedure: ESOPHAGOGASTRODUODENOSCOPY (EGD) WITH PROPOFOL;  Surgeon: Charlott Rakes, MD;  Location: [facility] ENDOSCOPY;  Service: Endoscopy;  Laterality: Left;  . NO PAST SURGERIES    . RIGHT HEART CATH N/A 12/20/2019   Procedure: RIGHT HEART CATH;  Surgeon: Dolores Patty, MD;  Location: [facility] INVASIVE CV LAB;  Service: Cardiovascular;  Laterality: N/A;  . RIGHT/LEFT HEART CATH AND CORONARY ANGIOGRAPHY N/A 03/01/2017   Procedure: Right/Left Heart Cath and Coronary Angiography;  Surgeon: Dolores Patty, MD;  Location: [facility] INVASIVE CV LAB;  Service: Cardiovascular;  Laterality: N/A;    Current Outpatient Medications  Medication Sig Dispense Refill  . carvedilol (COREG) 3.125 MG tablet Take 3.125 mg by mouth 2 (two) times daily with a meal.    . ELIQUIS 5 MG TABS tablet TAKE 1 TABLET BY MOUTH TWICE A DAY 60 tablet 6  . empagliflozin (JARDIANCE) 10 MG TABS tablet Take 10 mg by mouth daily before breakfast. 30 tablet 11  . fluconazole (DIFLUCAN) 100 MG tablet Take 100 mg by mouth daily.    Marland Kitchen MITIGARE 0.6 MG CAPS Take 0.6 mg by mouth daily as needed (gout flare). 30 capsule 5  . ondansetron (ZOFRAN ODT) 4 MG disintegrating tablet Take 1 tablet (4 mg total) by mouth every 8 (eight) hours as needed for nausea or vomiting. 20 tablet 0  . pantoprazole (PROTONIX) 40 MG tablet Take 40 mg by mouth daily.    . promethazine (PROMETHEGAN) 12.5 MG suppository Place 1 suppository (12.5 mg total) rectally every 6 (six) hours as needed for nausea or vomiting. 12 each 0  . spironolactone (ALDACTONE) 25 MG tablet Take 25 mg by mouth daily.    . sucralfate (CARAFATE) 1 g tablet Take 1 g by mouth daily.     Marland Kitchen torsemide (DEMADEX) 10 MG tablet Take 1 tablet (10 mg total) by mouth daily. 30 tablet 3  . Vitamin D, Ergocalciferol, (DRISDOL) 1.25 MG (50000 UNIT) CAPS capsule Take 50,000 Units by mouth once a week.    Marland Kitchen allopurinol (ZYLOPRIM) 100 MG tablet Take 2 tablets (200 mg total) by mouth daily. Please  contact PCP for further refills 60 tablet 0   No current facility-administered medications for this encounter.    Allergies:   Entresto [sacubitril-valsartan]   Social History:  The patient  reports that he has never smoked. He has never used smokeless tobacco. He reports current alcohol use. He reports that he does not use drugs.   Family History:  The patient's family history includes CVA in his mother; Gout in his father; Hypertension in his father; Multiple sclerosis in his mother; Seizures in his mother.   ROS:  Please see the history of present illness.   All other systems are personally reviewed and negative.   Vitals:   01/21/21 1520  BP: (!) 148/92  Pulse: 65  SpO2: 94%  Weight: 131 kg (288 lb 12.8 oz)   Wt Readings from Last 3 Encounters:  01/21/21 131 kg (288 lb 12.8 oz)  12/31/20 (!) 140.7 kg (310 lb 3.2 oz)  09/16/20 117.9 kg (260 lb)   Exam:  General:  NAD. No resp difficulty. HEENT: Normal Neck: Supple. JVP 8-9. Carotids 2+ bilat; no bruits. No lymphadenopathy or thryomegaly appreciated. Cor: PMI nondisplaced. Irregular rate & rhythm. No rubs, gallops or murmurs. Lungs: Clear Abdomen: Obese, soft, nontender, nondistended. No hepatosplenomegaly. No bruits or masses. Good bowel sounds. Extremities: No cyanosis, clubbing, rash, 1-2+ LE edema Neuro: Alert & oriented x 3, cranial nerves grossly intact. Moves all 4 extremities w/o difficulty. Affect pleasant.  ECG: atrial fibrillation 76 bpm (Personally reviewed).  Reds: 38%  Recent Labs: 06/17/2020: Magnesium 2.2 06/21/2020: ALT 59 07/22/2020: B Natriuretic Peptide 280.4; BUN 15; Creatinine, Ser 1.23; Potassium 4.2; Sodium 139 09/16/2020: Hemoglobin 16.3; Platelets 185  Personally reviewed   Wt Readings from Last 3 Encounters:  01/21/21 131 kg (288 lb 12.8 oz)  12/31/20 (!) 140.7 kg (310 lb 3.2 oz)  09/16/20 117.9 kg (260 lb)    ASSESSMENT AND PLAN: 1. Combined Systolic/Diastolic Heart Failure- ECHO 02/22/2017  EF 40-45% Grade II DD. Peak PA pressure 46 mmhg. Echo 9/18 EF ~20-25%. Echo 01/2018: EF 40-45%  - Echo 1/20  EF 35-40% with mild RV dysfunction RVSP 40 mmHG - Echo (12/16/19): EF 40-45% Moderate RV dysfunction  - cMRI 8/21 EF 45% + infiltrative process suggestive of possible amyloidosis - Hospitalization 8/21 with volume depletion and AKI. HF meds cut back. - NYHA II. Volume up today. - Restart torsemide 10 mg daily. - Continue carvedilol 3.125 mg bid. (Previously cut back with conduction system disease and bradycardia). - Continue spiro 25 mg daily.  - Continue Jardiance 10 mg daily. - Off Bidil with hypotension. May be able to add back at next visit. - Off losartan due to AKI. Will not add back with frequent vomiting and risk for repeat AKI. - Has been following with Dr. Jomarie Longs for [facility]. Felt to have characteristics for a genetic CM but not to have pathogenic variant for HCM. Enrolling in the Cardiomyopathy Study through [place].  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  - MM panel negative and PYP scan pending. May need endomyocardial biopsy.  - BMET today; repeat 7-10 days. - Needs to wear compression hose.  2. Possible cirrhosis, possible cardiac in nature - Suspect related to chronic RV failure in setting of OSA/OHS. +/- fatty liver - RHC (2/21) with only minimally elevated PA pressures (much lower than I would suspect given his degree of cirrhosis on MRI) - Following with [facility], core-liver bx (11/21) & abd [place] (1/22) unremarkable. - Struggling with N/V. Continue antiemetics.  3. Sleep Apnea  - Wearing CPAP.   4. Pulmonary HTN - RV strain on echo.  - RHC in 2/21 minimal PAH with low PAPi. - Stressed need for weight loss and continued CPAP compliance.  5. Morbid Obesity: - Body mass index is 43.27 kg/m.  - Consider [employer]  6. PVCs - ? If cardiomyopathy in part PVC-related. 24-hour monitor (7/18)  PVCs 6%. - NSR with AV dissociation by EKG previously.   - EP as below.   6. Paroxysmal atrial fib/Atrial flutter  - S/p a flutter ablation 04/06/18 with Dr. Ladona Ridgel. - Previous EKG NSR with AV dissociation and junctional rhythm at 77 bpm. Has seen Dr. Ladona Ridgel.  - AF today, rate control. - Carvedilol decreased due to conduction disease and bradycardia. - On Eliquis. No bleeding.  7. HTN - Elevated today. Suspect he is not able to keep down any of his meds with his daily vomiting.   Follow up in 3-4 weeks to reassess  fluid and kidney function.  Signed, Jacklynn Ganong, FNP -[facility] 01/21/2021 3:52 PM  [facility] Failure [facility] [place] [address] Heart and Vascular [place] [address] [phone] (office) [phone] (fax)

## 2021-01-21 ENCOUNTER — Other Ambulatory Visit: Payer: Self-pay

## 2021-01-21 ENCOUNTER — Ambulatory Visit (HOSPITAL_COMMUNITY)
Admission: RE | Admit: 2021-01-21 | Discharge: 2021-01-21 | Disposition: A | Discharge status: Home / Self Care | Payer: 59 | Source: Ambulatory Visit | Attending: Family Medicine | Admitting: Family Medicine

## 2021-01-21 ENCOUNTER — Encounter (HOSPITAL_COMMUNITY): Payer: Self-pay

## 2021-01-21 VITALS — BP 148/92 | HR 65 | Wt 288.8 lb

## 2021-01-21 DIAGNOSIS — Z7901 Long term (current) use of anticoagulants: Secondary | ICD-10-CM | POA: Insufficient documentation

## 2021-01-21 DIAGNOSIS — I428 Other cardiomyopathies: Secondary | ICD-10-CM

## 2021-01-21 DIAGNOSIS — Z6841 Body Mass Index (BMI) 40.0 and over, adult: Secondary | ICD-10-CM | POA: Insufficient documentation

## 2021-01-21 DIAGNOSIS — M109 Gout, unspecified: Secondary | ICD-10-CM | POA: Insufficient documentation

## 2021-01-21 DIAGNOSIS — R112 Nausea with vomiting, unspecified: Secondary | ICD-10-CM | POA: Insufficient documentation

## 2021-01-21 DIAGNOSIS — Z8249 Family history of ischemic heart disease and other diseases of the circulatory system: Secondary | ICD-10-CM | POA: Diagnosis not present

## 2021-01-21 DIAGNOSIS — Z79899 Other long term (current) drug therapy: Secondary | ICD-10-CM | POA: Insufficient documentation

## 2021-01-21 DIAGNOSIS — G4730 Sleep apnea, unspecified: Secondary | ICD-10-CM | POA: Diagnosis not present

## 2021-01-21 DIAGNOSIS — I110 Hypertensive heart disease with heart failure: Secondary | ICD-10-CM | POA: Diagnosis not present

## 2021-01-21 DIAGNOSIS — Z9989 Dependence on other enabling machines and devices: Secondary | ICD-10-CM | POA: Diagnosis not present

## 2021-01-21 DIAGNOSIS — I5042 Chronic combined systolic (congestive) and diastolic (congestive) heart failure: Secondary | ICD-10-CM | POA: Insufficient documentation

## 2021-01-21 DIAGNOSIS — I493 Ventricular premature depolarization: Secondary | ICD-10-CM | POA: Insufficient documentation

## 2021-01-21 DIAGNOSIS — I440 Atrioventricular block, first degree: Secondary | ICD-10-CM | POA: Insufficient documentation

## 2021-01-21 DIAGNOSIS — I959 Hypotension, unspecified: Secondary | ICD-10-CM | POA: Insufficient documentation

## 2021-01-21 DIAGNOSIS — K760 Fatty (change of) liver, not elsewhere classified: Secondary | ICD-10-CM | POA: Insufficient documentation

## 2021-01-21 DIAGNOSIS — K761 Chronic passive congestion of liver: Secondary | ICD-10-CM

## 2021-01-21 DIAGNOSIS — Z7984 Long term (current) use of oral hypoglycemic drugs: Secondary | ICD-10-CM | POA: Insufficient documentation

## 2021-01-21 DIAGNOSIS — I272 Pulmonary hypertension, unspecified: Secondary | ICD-10-CM | POA: Insufficient documentation

## 2021-01-21 DIAGNOSIS — I480 Paroxysmal atrial fibrillation: Secondary | ICD-10-CM | POA: Diagnosis not present

## 2021-01-21 DIAGNOSIS — Z87448 Personal history of other diseases of urinary system: Secondary | ICD-10-CM | POA: Diagnosis not present

## 2021-01-21 DIAGNOSIS — I4891 Unspecified atrial fibrillation: Secondary | ICD-10-CM

## 2021-01-21 DIAGNOSIS — G4733 Obstructive sleep apnea (adult) (pediatric): Secondary | ICD-10-CM | POA: Diagnosis not present

## 2021-01-21 DIAGNOSIS — I10 Essential (primary) hypertension: Secondary | ICD-10-CM

## 2021-01-21 LAB — BASIC METABOLIC PANEL
Anion gap: 8 (ref 5–15)
BUN: 10 mg/dL (ref 6–20)
CO2: 29 mmol/L (ref 22–32)
Calcium: 9.1 mg/dL (ref 8.9–10.3)
Chloride: 104 mmol/L (ref 98–111)
Creatinine, Ser: 1.5 mg/dL — ABNORMAL HIGH (ref 0.61–1.24)
GFR, Estimated: >60 mL/min (ref >60)
Glucose, Bld: 100 mg/dL — ABNORMAL HIGH (ref 70–99)
Potassium: 4.2 mmol/L (ref 3.5–5.1)
Sodium: 141 mmol/L (ref 135–145)

## 2021-01-21 NOTE — Patient Instructions (Addendum)
It was great to see you today! No medication changes are needed at this time.  Labs today We will only contact you if something comes back abnormal or we need to make some changes. Otherwise no news is good news!  Labs needed in 7-10 days  You have been ordered a PYP Scan.  This is done in the Radiology Department of [facility].  When you come for this test please plan to be there 2-3 hours.  Do the following things EVERYDAY: 1) Weigh yourself in the morning before breakfast. Write it down and keep it in a log. 2) Take your medicines as prescribed 3) Eat low salt foods--Limit salt (sodium) to 2000 mg per day.  4) Stay as active as you can everyday 5) Limit all fluids for the day to less than 2 liters  At the Advanced [facility] Failure Clinic, you and your health needs are our priority. As part of our continuing mission to provide you with exceptional heart care, we have created designated Provider Care Teams. These Care Teams include your primary Cardiologist (physician) and Advanced Practice Providers (APPs- Physician Assistants and Nurse Practitioners) who all work together to provide you with the care you need, when you need it.   You may see any of the following providers on your designated Care Team at your next follow up: Marland Kitchen Dr Arvilla Meres . Dr Marca Ancona . Dr Thornell Mule . [name], NP . Robbie Lis, PA . Shanda Bumps [place],NP . Karle Plumber, PharmD   Please be sure to bring in all your medications bottles to every appointment.   If you have any questions or concerns before your next appointment please send [place] a message through [place] or call our office at [phone].    TO LEAVE A MESSAGE FOR THE NURSE SELECT OPTION 2, PLEASE LEAVE A MESSAGE INCLUDING: . YOUR NAME . DATE OF BIRTH . CALL BACK NUMBER . REASON FOR CALL**this is important as we prioritize the call backs  YOU WILL RECEIVE A CALL BACK THE SAME DAY AS LONG AS YOU CALL BEFORE 4:00 PM

## 2021-01-21 NOTE — Progress Notes (Signed)
ReDS Vest / Clip - 01/21/21 1500      ReDS Vest / Clip   Station Marker D    Ruler Value 41    ReDS Value Range Moderate volume overload    ReDS Actual Value 38

## 2021-01-28 ENCOUNTER — Other Ambulatory Visit: Payer: Self-pay

## 2021-01-28 ENCOUNTER — Ambulatory Visit (HOSPITAL_COMMUNITY)
Admission: RE | Admit: 2021-01-28 | Discharge: 2021-01-28 | Disposition: A | Discharge status: Home / Self Care | Payer: 59 | Source: Ambulatory Visit | Attending: Internal Medicine | Admitting: Internal Medicine

## 2021-01-28 DIAGNOSIS — I428 Other cardiomyopathies: Secondary | ICD-10-CM | POA: Diagnosis not present

## 2021-01-28 LAB — BASIC METABOLIC PANEL
Anion gap: 6 (ref 5–15)
BUN: 15 mg/dL (ref 6–20)
CO2: 29 mmol/L (ref 22–32)
Calcium: 8.8 mg/dL — ABNORMAL LOW (ref 8.9–10.3)
Chloride: 101 mmol/L (ref 98–111)
Creatinine, Ser: 1.38 mg/dL — ABNORMAL HIGH (ref 0.61–1.24)
GFR, Estimated: >60 mL/min (ref >60)
Glucose, Bld: 152 mg/dL — ABNORMAL HIGH (ref 70–99)
Potassium: 4 mmol/L (ref 3.5–5.1)
Sodium: 136 mmol/L (ref 135–145)

## 2021-02-01 ENCOUNTER — Ambulatory Visit (HOSPITAL_COMMUNITY): Payer: 59

## 2021-02-01 ENCOUNTER — Encounter (HOSPITAL_COMMUNITY): Payer: 59

## 2021-02-09 ENCOUNTER — Other Ambulatory Visit (HOSPITAL_COMMUNITY): Payer: Self-pay | Admitting: Internal Medicine

## 2021-02-18 ENCOUNTER — Other Ambulatory Visit: Payer: Self-pay

## 2021-02-18 ENCOUNTER — Encounter (HOSPITAL_COMMUNITY): Payer: Self-pay

## 2021-02-18 ENCOUNTER — Ambulatory Visit (HOSPITAL_COMMUNITY)
Admission: RE | Admit: 2021-02-18 | Discharge: 2021-02-18 | Disposition: A | Discharge status: Home / Self Care | Payer: 59 | Source: Ambulatory Visit | Attending: Adult Health | Admitting: Adult Health

## 2021-02-18 VITALS — BP 96/68 | HR 70 | Wt 314.0 lb

## 2021-02-18 DIAGNOSIS — I4892 Unspecified atrial flutter: Secondary | ICD-10-CM | POA: Diagnosis not present

## 2021-02-18 DIAGNOSIS — Z7901 Long term (current) use of anticoagulants: Secondary | ICD-10-CM | POA: Diagnosis not present

## 2021-02-18 DIAGNOSIS — K761 Chronic passive congestion of liver: Secondary | ICD-10-CM | POA: Diagnosis not present

## 2021-02-18 DIAGNOSIS — Z7984 Long term (current) use of oral hypoglycemic drugs: Secondary | ICD-10-CM | POA: Diagnosis not present

## 2021-02-18 DIAGNOSIS — G4733 Obstructive sleep apnea (adult) (pediatric): Secondary | ICD-10-CM

## 2021-02-18 DIAGNOSIS — I272 Pulmonary hypertension, unspecified: Secondary | ICD-10-CM | POA: Diagnosis not present

## 2021-02-18 DIAGNOSIS — I110 Hypertensive heart disease with heart failure: Secondary | ICD-10-CM | POA: Diagnosis not present

## 2021-02-18 DIAGNOSIS — Z8249 Family history of ischemic heart disease and other diseases of the circulatory system: Secondary | ICD-10-CM | POA: Insufficient documentation

## 2021-02-18 DIAGNOSIS — I493 Ventricular premature depolarization: Secondary | ICD-10-CM | POA: Insufficient documentation

## 2021-02-18 DIAGNOSIS — G4730 Sleep apnea, unspecified: Secondary | ICD-10-CM | POA: Insufficient documentation

## 2021-02-18 DIAGNOSIS — Z79899 Other long term (current) drug therapy: Secondary | ICD-10-CM | POA: Insufficient documentation

## 2021-02-18 DIAGNOSIS — I5042 Chronic combined systolic (congestive) and diastolic (congestive) heart failure: Secondary | ICD-10-CM | POA: Insufficient documentation

## 2021-02-18 DIAGNOSIS — I428 Other cardiomyopathies: Secondary | ICD-10-CM

## 2021-02-18 DIAGNOSIS — Z6841 Body Mass Index (BMI) 40.0 and over, adult: Secondary | ICD-10-CM | POA: Insufficient documentation

## 2021-02-18 DIAGNOSIS — I480 Paroxysmal atrial fibrillation: Secondary | ICD-10-CM | POA: Insufficient documentation

## 2021-02-18 DIAGNOSIS — M109 Gout, unspecified: Secondary | ICD-10-CM | POA: Diagnosis not present

## 2021-02-18 LAB — BASIC METABOLIC PANEL
Anion gap: 4 — ABNORMAL LOW (ref 5–15)
BUN: 18 mg/dL (ref 6–20)
CO2: 30 mmol/L (ref 22–32)
Calcium: 8.5 mg/dL — ABNORMAL LOW (ref 8.9–10.3)
Chloride: 105 mmol/L (ref 98–111)
Creatinine, Ser: 1.4 mg/dL — ABNORMAL HIGH (ref 0.61–1.24)
GFR, Estimated: >60 mL/min (ref >60)
Glucose, Bld: 111 mg/dL — ABNORMAL HIGH (ref 70–99)
Potassium: 4.1 mmol/L (ref 3.5–5.1)
Sodium: 139 mmol/L (ref 135–145)

## 2021-02-18 NOTE — Progress Notes (Signed)
Advanced Heart Failure Clinic Note   PCP:  [name], MD  Primary HF: Dr. [name] GI : Dr Marko Plume.   Chief Complaint: Heart Fialure    History of Present Illness: Bradley Powell is a 40 y.o. male with a history of combined systolic/diastolic heart failure,  obesity, HTN, and gout.   Admitted 4/13 through 03/07/17 with marked volume overload. EF 40-45%. Diuresed with IV lasix tid + metolazone. Had RHC/LHC as noted below. Overall diuresed 45 pounds. Discharge weight was 281 pounds.   He underwent atrial flutter ablation on 04/06/18.  At last visit he had marked 1st AV block with progression to junctional rhythm. Saw Dr. Ladona Ridgel who wanted to follow it. Suggested cutting back carvedilol.  Previously Bidil stopped due to hypotension.  Admitted in 8/21 with intractable n/v of unclear etiology, hypotension and AKI. Creatinine peaked at 4.5.Back to 1.6 at d/c.  cMRI 7/21 EF 45% suspect infiltrative CM. ECV 55%  Followed by GI and started on phenergan, protonix, and carafate. He has had some improvement.   Today he returns for HF follow up. Last visit volume overloaded so torsemide 10 mg daily started. Overall feeling fine. SOB of steps. Denies PND/Orthopnea. Using CPAP. Appetite ok. No fever or chills. Weight at home has gone up to 300 pounds. Walking about 1.5 miles a day. Taking all medications but ran out of  jardiance 7 days. Part time teacher.   Cardiac Studies  cMRI (7/21): 1. Findings suggestive of infiltrative cardiomyopathy. Native T1 ECV 55%, with diffuse post contrast delayed myocardial enhancement and abnormal T1 myocardial nulling kinetics, consistent with a diagnosis of cardiac amyloidosis. 2.  Upper limit of normal biventricular chamber size. 3. Mildly reduced biventricular systolic function. LVEF 45%, RVEF 44%. 4. Cannot exclude possible anomalous pulmonary venous drainage in to the mid SVC. Seen on axial HASTE image series 3 image 4,  but evaluation limited due to poor spatial resolution. Consider ECG gated CTA chest for further evaluation.  - Echo 2/21: EF 40-45%  - Echo 1/20: EF 35-40% - Echo 9/18: EF ~25-30%.  - Echo 3/19: EF 45-50%  - Holter 7/18: 6% PVCs - Sleep study (10/18): AHI 26/hr  - RHC (2/21): RA = 14 RV = 43/15 PA = 43/21 (31) PCW = 15 Fick cardiac output/index = 5.7/2.4 PVR = 2.8 WU Ao sat = 97% PA sat = 73%, 75% SVC sat = 67%  - R/LHC (4/18): AO = 109/86 (97) LV =  101/28 RA =  29 RV = 50/22 PA = 55/24 (38) PCW = 26 Fick cardiac output/index = 6.4/2.6 Thermal CO/CI = 4.9/2.0 PVR = 2.5 WU FA sat = 98% PA sat = 69%, 71% SVC sat = 82%, 82% IVC sat = 66% Assessment: 1. Normal coronary arteries  2. Mild to moderate pulmonary HTN with normal PVR 3. Biventricular volume overload with R > L heart failure 4. Moderately decreased CO by thermodilution  5. 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 No intra-cardiac shunt   Past Medical History:  Diagnosis Date  . Atrial flutter (HCC)    a. s/p ablation 03/2018.  Marland Kitchen Chronic combined systolic and diastolic CHF (congestive heart failure) (HCC)   . History of esophagogastroduodenoscopy (EGD)   . Morbid obesity (HCC)   . NICM (nonischemic cardiomyopathy) (HCC)   . PVC's (premature ventricular contractions)   . Sleep apnea    Past Surgical History:  Procedure  Laterality Date  . A-FLUTTER ABLATION N/A 04/06/2018   Procedure: A-FLUTTER ABLATION;  Surgeon: Marinus Maw, MD;  Location: [facility] INVASIVE CV LAB;  Service: Cardiovascular;  Laterality: N/A;  . ESOPHAGEAL BRUSHING  06/19/2020   Procedure: ESOPHAGEAL BRUSHING;  Surgeon: Charlott Rakes, MD;  Location: [facility] ENDOSCOPY;  Service: Endoscopy;;  . ESOPHAGOGASTRODUODENOSCOPY (EGD) WITH PROPOFOL Left 06/19/2020   Procedure: ESOPHAGOGASTRODUODENOSCOPY (EGD) WITH PROPOFOL;  Surgeon: Charlott Rakes, MD;  Location:  [facility] ENDOSCOPY;  Service: Endoscopy;  Laterality: Left;  . NO PAST SURGERIES    . RIGHT HEART CATH N/A 12/20/2019   Procedure: RIGHT HEART CATH;  Surgeon: Dolores Patty, MD;  Location: [facility] INVASIVE CV LAB;  Service: Cardiovascular;  Laterality: N/A;  . RIGHT/LEFT HEART CATH AND CORONARY ANGIOGRAPHY N/A 03/01/2017   Procedure: Right/Left Heart Cath and Coronary Angiography;  Surgeon: Dolores Patty, MD;  Location: [facility] INVASIVE CV LAB;  Service: Cardiovascular;  Laterality: N/A;    Current Outpatient Medications  Medication Sig Dispense Refill  . allopurinol (ZYLOPRIM) 100 MG tablet Take 2 tablets (200 mg total) by mouth daily. Please contact PCP for further refills 60 tablet 0  . carvedilol (COREG) 3.125 MG tablet Take 3.125 mg by mouth 2 (two) times daily with a meal.    . clotrimazole (MYCELEX) 10 MG troche SMARTSIG:1 Lozenge(s) By Mouth 5 Times Daily    . ELIQUIS 5 MG TABS tablet TAKE 1 TABLET BY MOUTH TWICE A DAY 60 tablet 6  . JARDIANCE 10 MG TABS tablet TAKE 1 TABLET BY MOUTH EVERY DAY BEFORE BREAKFAST. 30 tablet 11  . MITIGARE 0.6 MG CAPS Take 0.6 mg by mouth daily as needed (gout flare). 30 capsule 5  . ondansetron (ZOFRAN ODT) 4 MG disintegrating tablet Take 1 tablet (4 mg total) by mouth every 8 (eight) hours as needed for nausea or vomiting. 20 tablet 0  . pantoprazole (PROTONIX) 40 MG tablet Take 40 mg by mouth daily.    . promethazine (PROMETHEGAN) 12.5 MG suppository Place 1 suppository (12.5 mg total) rectally every 6 (six) hours as needed for nausea or vomiting. 12 each 0  . spironolactone (ALDACTONE) 25 MG tablet Take 25 mg by mouth daily.    . sucralfate (CARAFATE) 1 g tablet Take 1 g by mouth daily.     Marland Kitchen torsemide (DEMADEX) 10 MG tablet Take 1 tablet (10 mg total) by mouth daily. 30 tablet 3  . Vitamin D, Ergocalciferol, (DRISDOL) 1.25 MG (50000 UNIT) CAPS capsule Take 50,000 Units by mouth once a week.     No current facility-administered medications for this encounter.     Allergies:   Entresto [sacubitril-valsartan]   Social History:  The patient  reports that he has never smoked. He has never used smokeless tobacco. He reports current alcohol use. He reports that he does not use drugs.   Family History:  The patient's family history includes CVA in his mother; Gout in his father; Hypertension in his father; Multiple sclerosis in his mother; Seizures in his mother.   ROS:  Please see the history of present illness.   All other systems are personally reviewed and negative.   Vitals:   02/18/21 1511  BP: 96/68  Pulse: 70  SpO2: 97%  Weight: (!) 142.4 kg (314 lb)   Wt Readings from Last 3 Encounters:  02/18/21 (!) 142.4 kg (314 lb)  01/21/21 131 kg (288 lb 12.8 oz)  12/31/20 (!) 140.7 kg (310 lb 3.2 oz)   Exam:   General:  Well appearing.  No resp difficulty HEENT: normal Neck: supple. JVP 9-10 . Carotids 2+ bilat; no bruits. No lymphadenopathy or thryomegaly appreciated. Cor: PMI nondisplaced. Regular rate & rhythm. No rubs, gallops or murmurs. Lungs: clear Abdomen: obese, soft, nontender, nondistended. No hepatosplenomegaly. No bruits or masses. Good bowel sounds. Extremities: no cyanosis, clubbing, rash, R and LLE 1-2+ edema Neuro: alert & orientedx3, cranial nerves grossly intact. moves all 4 extremities w/o difficulty. Affect pleasant   Recent Labs: 06/17/2020: Magnesium 2.2 06/21/2020: ALT 59 07/22/2020: B Natriuretic Peptide 280.4 09/16/2020: Hemoglobin 16.3; Platelets 185 01/28/2021: BUN 15; Creatinine, Ser 1.38; Potassium 4.0; Sodium 136  Personally reviewed   Wt Readings from Last 3 Encounters:  02/18/21 (!) 142.4 kg (314 lb)  01/21/21 131 kg (288 lb 12.8 oz)  12/31/20 (!) 140.7 kg (310 lb 3.2 oz)    ASSESSMENT AND PLAN: 1. Combined Systolic/Diastolic Heart Failure- ECHO 02/22/2017 EF 40-45% Grade II DD. Peak PA pressure 46 mmhg. Echo 9/18 EF ~20-25%. Echo 01/2018: EF 40-45%  - Echo 1/20  EF 35-40% with mild RV dysfunction RVSP 40  mmHG - Echo (12/16/19): EF 40-45% Moderate RV dysfunction  - cMRI 8/21 EF 45% + infiltrative process suggestive of possible amyloidosis - Hospitalization 8/21 with volume depletion and AKI. HF meds cut back. - NYHA II. Volume status. Continue  torsemide 10 mg daily. - Continue carvedilol 3.125 mg bid. (Previously cut back with conduction system disease and bradycardia). - Continue spiro 25 mg daily.  - Refill sent in 7 days ago but he hasnt picked up. Restart Jardiance 10 mg daily. - Off Bidil with hypotension. May be able to add back at next visit. - Off losartan due to AKI and vomiting.  - Has been following with Dr. Jomarie Longs for Genetics Counseling. Felt to have characteristics for a genetic CM but not to have pathogenic variant for HCM. He has not turned paper work in to enroll in the Cardiomyopathy Study through [facility].  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  - MM panel negative  - PYP scan has been ordered--->02/26/21 .  May need endomyocardial biopsy.  - BMET today; repeat 7-10 days. - Needs to wear compression hose.  2. Possible cirrhosis, possible cardiac in nature - Suspect related to chronic RV failure in setting of OSA/OHS. +/- fatty liver - RHC (2/21) with only minimally elevated PA pressures (much lower than I would suspect given his degree of cirrhosis on MRI) - Following with [facility], core-liver bx (11/21) & abd [place] (1/22) unremarkable. - Struggling with N/V. Continue antiemetics. - Followed  Closely by GI and he has been referred to Gastrologist at [facility].   3. Sleep Apnea  - Continue CPAP daily  4. Pulmonary HTN - RV strain on echo.  - RHC in 2/21 minimal PAH with low PAPi. -Needs to continue CPAP>   5. Morbid Obesity: - Body mass index is 47.05 kg/m.  - Consider [employer]  6. PVCs - ? If cardiomyopathy in part PVC-related. 24-hour monitor (7/18)  PVCs 6%. - NSR with AV dissociation by EKG previously.  - EP as below.   6. Paroxysmal atrial fib/Atrial  flutter  - S/p a flutter ablation 04/06/18 with Dr. Ladona Ridgel. - Previous EKG NSR with AV dissociation and junctional rhythm at 77 bpm. Has seen Dr. Ladona Ridgel.  -- On Eliquis. No bleeding.  7. HTN Low today.   Follow up in 8 weeks.   Signed, [name], NP -C 02/18/2021 3:22 PM  [facility] [place] [address]  [address] Heart and [place] [address] [phone] (office) [phone] (fax)

## 2021-02-18 NOTE — Patient Instructions (Addendum)
Be sure to pick up Jardiance from the pharmacy  Labs today We will only contact you if something comes back abnormal or we need to make some changes. Otherwise no news is good news!  Your physician recommends that you schedule a follow-up appointment in: 2 months with Dr [name]  Do the following things EVERYDAY: 1) Weigh yourself in the morning before breakfast. Write it down and keep it in a log. 2) Take your medicines as prescribed 3) Eat low salt foods--Limit salt (sodium) to 2000 mg per day.  4) Stay as active as you can everyday 5) Limit all fluids for the day to less than 2 liters  At the [facility], you and your health needs are our priority. As part of our continuing mission to provide you with exceptional heart care, we have created designated Provider Care Teams. These Care Teams include your primary Cardiologist (physician) and Advanced Practice Providers (APPs- Physician Assistants and Nurse Practitioners) who all work together to provide you with the care you need, when you need it.   You may see any of the following providers on your designated Care Team at your next follow up: Marland Kitchen Dr Arvilla Meres . Dr Marca Ancona . Dr Thornell Mule . [name], NP . Robbie Lis, PA . Shanda Bumps [place],NP . Karle Plumber, PharmD   Please be sure to bring in all your medications bottles to every appointment.      If you have any questions or concerns before your next appointment please send [place] a message through [place] or call our office at [phone].    TO LEAVE A MESSAGE FOR THE NURSE SELECT OPTION 2, PLEASE LEAVE A MESSAGE INCLUDING: . YOUR NAME . DATE OF BIRTH . CALL BACK NUMBER . REASON FOR CALL**this is important as we prioritize the call backs  YOU WILL RECEIVE A CALL BACK THE SAME DAY AS LONG AS YOU CALL BEFORE 4:00 PM  Please see our updated No Show and Same Day Appointment Cancellation Policy attached to your AVS.

## 2021-02-26 ENCOUNTER — Ambulatory Visit (HOSPITAL_COMMUNITY)
Admission: RE | Admit: 2021-02-26 | Discharge: 2021-02-26 | Disposition: A | Discharge status: Home / Self Care | Payer: 59 | Source: Ambulatory Visit | Attending: Internal Medicine | Admitting: Internal Medicine

## 2021-02-26 ENCOUNTER — Encounter (HOSPITAL_COMMUNITY)
Admission: RE | Admit: 2021-02-26 | Discharge: 2021-02-26 | Disposition: A | Discharge status: Home / Self Care | Payer: 59 | Source: Ambulatory Visit | Attending: Internal Medicine | Admitting: Internal Medicine

## 2021-02-26 ENCOUNTER — Other Ambulatory Visit: Payer: Self-pay

## 2021-02-26 DIAGNOSIS — I428 Other cardiomyopathies: Secondary | ICD-10-CM | POA: Insufficient documentation

## 2021-02-26 IMAGING — NM NM SCAN TUMOR LOCALIZE WITH SPECT
4 series · 19 of 19 positions shown · non-contrast
Comparison: none

CLINICAL DATA: Heart failure, paroxysmal atrial flutter post
ablation, chronic combined systolic and diastolic CHF, non ischemic
cardiomyopathy

EXAM:
NUCLEAR MEDICINE TUMOR LOCALIZATION. PYP CARDIAC AMYLOIDOSIS SCAN
WITH SPECT
TECHNIQUE: Following intravenous administration of radiopharmaceutical,
anterior planar images of the chest were obtained. Regions of
interest were placed on the heart and contralateral chest wall for
quantitative assessment. Additional SPECT imaging of the chest was
obtained.
RADIOPHARMACEUTICALS:  22 mCi TECHNETIUM 99 PYROPHOSPHATE

[Series 1: ant-post · 4.14mm/px · 1 of 1 slices shown]
[im 1/1]
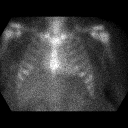

[Series 1: spect - (id)_(id)_tra · 4.1mm · 4.14mm/px · 6 of 128 frames shown]
[frame 11/128]
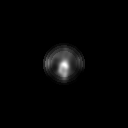
[frame 32/128]
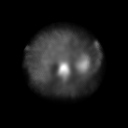
[frame 54/128]
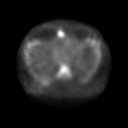
[frame 75/128]
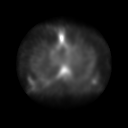
[frame 96/128]
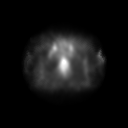
[frame 118/128]
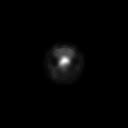

[Series 1: spect - (id)_(id)_cor · 4.1mm · 4.14mm/px · 6 of 128 frames shown]
[frame 11/128]
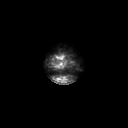
[frame 32/128]
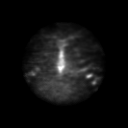
[frame 54/128]
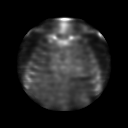
[frame 75/128]
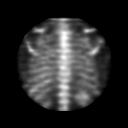
[frame 96/128]
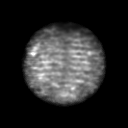
[frame 118/128]
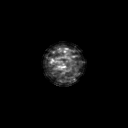

[Series 2: amyloid · 4.14mm/px · 6 of 64 frames shown]
[frame 6/64  full-range]
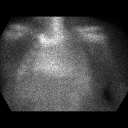
[frame 16/64  full-range]
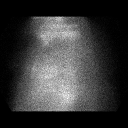
[frame 27/64  full-range]
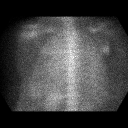
[frame 38/64  full-range]
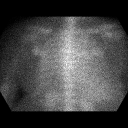
[frame 48/64  full-range]
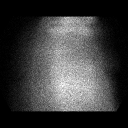
[frame 59/64  full-range]
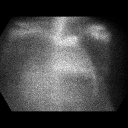

[19 of 19 positions shown; findings below may reference images not displayed]

FINDINGS: Planar Visual assessment:

Anterior planar imaging demonstrates radiotracer uptake within the
heart less than uptake within the adjacent ribs (Grade 1).

Quantitative assessment :

Quantitative assessment of the cardiac uptake compared to the
contralateral chest wall is equal to 1.26 (H/CL = 1.26).

SPECT assessment:

SPECT imaging of the chest demonstrates faint radiotracer
accumulation within the LEFT ventricle.
IMPRESSION: Visual and quantitative assessment (grade 1, H/CLL equal 1.26) are
equivocal for transthyretin amyloidosis.

## 2021-02-26 MED ORDER — TECHNETIUM TC 99M PYROPHOSPHATE
22.0000 | Freq: Once | INTRAVENOUS | Status: AC | PRN
  Administered 2021-02-26: 22 via INTRAVENOUS
  Filled 2021-02-26: qty 22

## 2021-03-30 ENCOUNTER — Other Ambulatory Visit (HOSPITAL_COMMUNITY): Payer: Self-pay | Admitting: *Deleted

## 2021-03-30 MED ORDER — CARVEDILOL 3.125 MG PO TABS
3.1250 mg | ORAL_TABLET | Freq: Two times a day (BID) | ORAL | 3 refills | Status: DC
Start: 2021-03-30 — End: 2021-05-10

## 2021-04-15 ENCOUNTER — Ambulatory Visit: Payer: 59 | Attending: Critical Care Medicine

## 2021-04-15 DIAGNOSIS — Z20822 Contact with and (suspected) exposure to covid-19: Secondary | ICD-10-CM

## 2021-04-16 LAB — SPECIMEN STATUS REPORT

## 2021-04-16 LAB — NOVEL CORONAVIRUS, NAA: SARS-CoV-2, NAA: NOT DETECTED

## 2021-04-16 LAB — SARS-COV-2, NAA 2 DAY TAT

## 2021-04-26 ENCOUNTER — Other Ambulatory Visit (HOSPITAL_BASED_OUTPATIENT_CLINIC_OR_DEPARTMENT_OTHER): Payer: Self-pay

## 2021-04-26 ENCOUNTER — Ambulatory Visit: Payer: 59 | Attending: Internal Medicine

## 2021-04-26 ENCOUNTER — Other Ambulatory Visit: Payer: Self-pay

## 2021-04-26 DIAGNOSIS — Z23 Encounter for immunization: Secondary | ICD-10-CM

## 2021-04-26 MED ORDER — PFIZER-BIONT COVID-19 VAC-TRIS 30 MCG/0.3ML IM SUSP
INTRAMUSCULAR | 0 refills | Status: DC
  Filled 2021-04-26: qty 0.3, 1d supply, fill #0

## 2021-04-26 NOTE — Progress Notes (Signed)
   Covid-19 Vaccination Clinic  Name:  Bradley Powell    MRN: [id number] DOB: 28-Aug-1981  04/26/2021  Mr. Burzynski was observed post Covid-19 immunization for 15 minutes without incident. He was provided with Vaccine Information Sheet and instruction to access the V-Safe system.   Mr. Kyler was instructed to call 911 with any severe reactions post vaccine: Difficulty breathing  Swelling of face and throat  A fast heartbeat  A bad rash all over body  Dizziness and weakness   Immunizations Administered     Name Date Dose VIS Date Route   PFIZER Comrnaty(Gray TOP) Covid-19 Vaccine 04/26/2021  3:45 PM 0.3 mL 10/22/2020 Intramuscular   Manufacturer: [employer], [employer]   Lot: [id number]   NDC: [phone]

## 2021-04-29 ENCOUNTER — Other Ambulatory Visit (HOSPITAL_COMMUNITY): Payer: Self-pay | Admitting: Internal Medicine

## 2021-05-05 ENCOUNTER — Other Ambulatory Visit: Payer: Self-pay

## 2021-05-05 ENCOUNTER — Encounter (HOSPITAL_COMMUNITY): Payer: Self-pay | Admitting: Internal Medicine

## 2021-05-05 ENCOUNTER — Ambulatory Visit (HOSPITAL_COMMUNITY)
Admission: RE | Admit: 2021-05-05 | Discharge: 2021-05-05 | Disposition: A | Discharge status: Home / Self Care | Payer: 59 | Source: Ambulatory Visit | Attending: Internal Medicine | Admitting: Internal Medicine

## 2021-05-05 VITALS — BP 104/65 | HR 75 | Ht 68.0 in | Wt 313.8 lb

## 2021-05-05 DIAGNOSIS — I5022 Chronic systolic (congestive) heart failure: Secondary | ICD-10-CM

## 2021-05-05 DIAGNOSIS — I493 Ventricular premature depolarization: Secondary | ICD-10-CM | POA: Insufficient documentation

## 2021-05-05 DIAGNOSIS — N179 Acute kidney failure, unspecified: Secondary | ICD-10-CM | POA: Diagnosis not present

## 2021-05-05 DIAGNOSIS — Z6841 Body Mass Index (BMI) 40.0 and over, adult: Secondary | ICD-10-CM | POA: Insufficient documentation

## 2021-05-05 DIAGNOSIS — I272 Pulmonary hypertension, unspecified: Secondary | ICD-10-CM | POA: Insufficient documentation

## 2021-05-05 DIAGNOSIS — Z79899 Other long term (current) drug therapy: Secondary | ICD-10-CM | POA: Diagnosis not present

## 2021-05-05 DIAGNOSIS — Z7901 Long term (current) use of anticoagulants: Secondary | ICD-10-CM | POA: Diagnosis not present

## 2021-05-05 DIAGNOSIS — I5042 Chronic combined systolic (congestive) and diastolic (congestive) heart failure: Secondary | ICD-10-CM | POA: Diagnosis not present

## 2021-05-05 DIAGNOSIS — Z8249 Family history of ischemic heart disease and other diseases of the circulatory system: Secondary | ICD-10-CM | POA: Diagnosis not present

## 2021-05-05 DIAGNOSIS — Z7984 Long term (current) use of oral hypoglycemic drugs: Secondary | ICD-10-CM | POA: Diagnosis not present

## 2021-05-05 DIAGNOSIS — I110 Hypertensive heart disease with heart failure: Secondary | ICD-10-CM | POA: Insufficient documentation

## 2021-05-05 DIAGNOSIS — K7460 Unspecified cirrhosis of liver: Secondary | ICD-10-CM | POA: Diagnosis not present

## 2021-05-05 DIAGNOSIS — I480 Paroxysmal atrial fibrillation: Secondary | ICD-10-CM | POA: Insufficient documentation

## 2021-05-05 DIAGNOSIS — G4730 Sleep apnea, unspecified: Secondary | ICD-10-CM | POA: Insufficient documentation

## 2021-05-05 DIAGNOSIS — R112 Nausea with vomiting, unspecified: Secondary | ICD-10-CM | POA: Diagnosis not present

## 2021-05-05 DIAGNOSIS — K760 Fatty (change of) liver, not elsewhere classified: Secondary | ICD-10-CM | POA: Insufficient documentation

## 2021-05-05 DIAGNOSIS — I4892 Unspecified atrial flutter: Secondary | ICD-10-CM | POA: Insufficient documentation

## 2021-05-05 LAB — BRAIN NATRIURETIC PEPTIDE: B Natriuretic Peptide: 300.3 pg/mL — ABNORMAL HIGH (ref 0.0–100.0)

## 2021-05-05 LAB — BASIC METABOLIC PANEL
Anion gap: 9 (ref 5–15)
BUN: 15 mg/dL (ref 6–20)
CO2: 26 mmol/L (ref 22–32)
Calcium: 8.6 mg/dL — ABNORMAL LOW (ref 8.9–10.3)
Chloride: 103 mmol/L (ref 98–111)
Creatinine, Ser: 1.2 mg/dL (ref 0.61–1.24)
GFR, Estimated: >60 mL/min (ref >60)
Glucose, Bld: 91 mg/dL (ref 70–99)
Potassium: 4 mmol/L (ref 3.5–5.1)
Sodium: 138 mmol/L (ref 135–145)

## 2021-05-05 LAB — CBC
HCT: 49.4 % (ref 39.0–52.0)
Hemoglobin: 15.4 g/dL (ref 13.0–17.0)
MCH: 28.8 pg (ref 26.0–34.0)
MCHC: 31.2 g/dL (ref 30.0–36.0)
MCV: 92.3 fL (ref 80.0–100.0)
Platelets: 274 10*3/uL (ref 150–400)
RBC: 5.35 MIL/uL (ref 4.22–5.81)
RDW: 13.3 % (ref 11.5–15.5)
WBC: 7.1 10*3/uL (ref 4.0–10.5)
nRBC: 0 % (ref 0.0–0.2)

## 2021-05-05 MED ORDER — POTASSIUM CHLORIDE CRYS ER 20 MEQ PO TBCR: 20.0000 meq | EXTENDED_RELEASE_TABLET | Freq: Two times a day (BID) | ORAL | 3 refills | Status: DC

## 2021-05-05 MED ORDER — TORSEMIDE 20 MG PO TABS: 40.0000 mg | ORAL_TABLET | Freq: Two times a day (BID) | ORAL | 3 refills | Status: DC

## 2021-05-05 MED ORDER — METOLAZONE 2.5 MG PO TABS
2.5000 mg | ORAL_TABLET | ORAL | 0 refills | Status: DC
Start: 2021-05-05 — End: 2021-05-21

## 2021-05-05 NOTE — Progress Notes (Signed)
Advanced Heart Failure Clinic Note   PCP:  [name], MD  Primary HF: Dr. [name] GI : Dr Marko Plume.   Chief Complaint: Heart Fialure    History of Present Illness:  Bradley Powell is a 40 y.o. male with a history of combined systolic/diastolic heart failure,  obesity, HTN, and gout.   Admitted 4/13 through 03/07/17 with marked volume overload. EF 40-45%. Diuresed with IV lasix tid + metolazone. Had RHC/LHC as noted below. Overall diuresed 45 pounds. Discharge weight was 281 pounds.    He underwent atrial flutter ablation on 04/06/18. Subsequently had marked 1st AV block with progression to junctional rhythm. Saw Dr. Ladona Ridgel who wanted to follow it. Suggested cutting back carvedilol.  Previously Bidil stopped due to hypotension.  Admitted  8/21 with intractable n/v of unclear etiology, hypotension and AKI. Creatinine peaked at 4.5.Back to 1.6 at d/c. Has been followed by GI.   cMRI 7/21 EF 45% suspect infiltrative CM. ECV 55%  Today he returns for HF follow up. Has gained almost 50 pounds in last few months. Increased torsemide on his own from 10mg  to 20mg  daily. Feels bloated Decreased appetite. Has some response to torsemide. Vomiting improved with carafate and protonix. Has visit at [facility] with GI.  Cardiac Studies  cMRI (7/21): 1. Findings suggestive of infiltrative cardiomyopathy. Native T1 ECV 55%, with diffuse post contrast delayed myocardial enhancement and abnormal T1 myocardial nulling kinetics, consistent with a diagnosis of cardiac amyloidosis. 2.  Upper limit of normal biventricular chamber size. 3. Mildly reduced biventricular systolic function. LVEF 45%, RVEF 44%. 4. Cannot exclude possible anomalous pulmonary venous drainage in to the mid SVC. Seen on axial HASTE image series 3 image 4, but evaluation limited due to poor spatial resolution. Consider ECG gated CTA chest for further evaluation.  - Echo 2/21: EF 40-45%  - Echo 1/20: EF  35-40% - Echo 9/18: EF ~25-30%.  - Echo 3/19: EF 45-50%  - Holter 7/18: 6% PVCs - Sleep study (10/18): AHI 26/hr  - RHC (2/21): RA = 14 RV = 43/15 PA = 43/21 (31) PCW = 15 Fick cardiac output/index = 5.7/2.4 PVR = 2.8 WU Ao sat = 97% PA sat = 73%, 75% SVC sat = 67%   - R/LHC (4/18): AO = 109/86 (97) LV =  101/28 RA =  29 RV = 50/22 PA = 55/24 (38) PCW = 26 Fick cardiac output/index = 6.4/2.6 Thermal CO/CI = 4.9/2.0 PVR = 2.5 WU FA sat = 98% PA sat = 69%, 71% SVC sat = 82%, 82% IVC sat = 66%  Assessment: 1. Normal coronary arteries  2. Mild to moderate pulmonary HTN with normal PVR 3. Biventricular volume overload with R > L heart failure 4. Moderately decreased CO by thermodilution  5. 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 No intra-cardiac shunt   Past Medical History:  Diagnosis Date   Atrial flutter (HCC)    a. s/p ablation 03/2018.   Chronic combined systolic and diastolic CHF (congestive heart failure) (HCC)    History of esophagogastroduodenoscopy (EGD)    Morbid obesity (HCC)    NICM (nonischemic cardiomyopathy) (HCC)    PVC's (premature ventricular contractions)    Sleep apnea    Past Surgical History:  Procedure Laterality Date   A-FLUTTER ABLATION N/A 04/06/2018   Procedure: A-FLUTTER ABLATION;  Surgeon: 04/2018, MD;  Location: [facility] INVASIVE CV LAB;  Service: Cardiovascular;  Laterality: N/A;   ESOPHAGEAL BRUSHING  06/19/2020   Procedure: ESOPHAGEAL BRUSHING;  Surgeon: Charlott Rakes, MD;  Location: [facility] ENDOSCOPY;  Service: Endoscopy;;   ESOPHAGOGASTRODUODENOSCOPY (EGD) WITH PROPOFOL Left 06/19/2020   Procedure: ESOPHAGOGASTRODUODENOSCOPY (EGD) WITH PROPOFOL;  Surgeon: Charlott Rakes, MD;  Location: [facility] ENDOSCOPY;  Service: Endoscopy;  Laterality: Left;   NO PAST SURGERIES     RIGHT HEART CATH N/A 12/20/2019   Procedure: RIGHT HEART CATH;  Surgeon: Dolores Patty, MD;  Location: [facility] INVASIVE CV LAB;  Service: Cardiovascular;  Laterality: N/A;   RIGHT/LEFT HEART CATH AND CORONARY ANGIOGRAPHY N/A 03/01/2017   Procedure: Right/Left Heart Cath and Coronary Angiography;  Surgeon: Dolores Patty, MD;  Location: [facility] INVASIVE CV LAB;  Service: Cardiovascular;  Laterality: N/A;    Current Outpatient Medications  Medication Sig Dispense Refill   allopurinol (ZYLOPRIM) 100 MG tablet Take 2 tablets (200 mg total) by mouth daily. Please contact PCP for further refills 60 tablet 0   carvedilol (COREG) 3.125 MG tablet Take 1 tablet (3.125 mg total) by mouth 2 (two) times daily with a meal. 60 tablet 3   COVID-19 mRNA Vac-TriS, Pfizer, (PFIZER-BIONT COVID-19 VAC-TRIS) SUSP injection Inject into the muscle. 0.3 mL 0   ELIQUIS 5 MG TABS tablet TAKE 1 TABLET BY MOUTH TWICE A DAY 60 tablet 6   JARDIANCE 10 MG TABS tablet TAKE 1 TABLET BY MOUTH EVERY DAY BEFORE BREAKFAST. 30 tablet 11   MITIGARE 0.6 MG CAPS Take 0.6 mg by mouth daily as needed (gout flare). 30 capsule 5   ondansetron (ZOFRAN ODT) 4 MG disintegrating tablet Take 1 tablet (4 mg total) by mouth every 8 (eight) hours as needed for nausea or vomiting. 20 tablet 0   pantoprazole (PROTONIX) 40 MG tablet Take 40 mg by mouth daily.     promethazine (PROMETHEGAN) 12.5 MG suppository Place 1 suppository (12.5 mg total) rectally every 6 (six) hours as needed for nausea or vomiting. 12 each 0   spironolactone (ALDACTONE) 25 MG tablet TAKE 1 TABLET BY MOUTH EVERYDAY AT BEDTIME 90 tablet 3   sucralfate (CARAFATE) 1 g tablet Take 1 g by mouth daily.      torsemide (DEMADEX) 10 MG tablet Take 1 tablet (10 mg total) by mouth daily. 30 tablet 3   No current facility-administered medications for this encounter.    Allergies:   Entresto [sacubitril-valsartan]   Social History:  The patient  reports that he has never smoked. He has never used smokeless tobacco. He reports current alcohol use. He reports that he does  not use drugs.   Family History:  The patient's family history includes CVA in his mother; Gout in his father; Hypertension in his father; Multiple sclerosis in his mother; Seizures in his mother.   ROS:  Please see the history of present illness.   All other systems are personally reviewed and negative.   Vitals:   05/05/21 1332  BP: 104/65  Pulse: 75  SpO2: 99%  Weight: (!) 142.3 kg (313 lb 12.8 oz)  Height: 5\' 8"  (1.727 m)   Wt Readings from Last 3 Encounters:  05/05/21 (!) 142.3 kg (313 lb 12.8 oz)  02/18/21 (!) 142.4 kg (314 lb)  01/21/21 131 kg (288 lb 12.8 oz)   Exam:   General:  Well appearing. No resp difficulty HEENT: normal Neck: supple. JVP 9-10 Carotids 2+ bilat; no bruits. No lymphadenopathy or thryomegaly appreciated. Cor: PMI nondisplaced. Regular rate & rhythm. No rubs, gallops or murmurs.  Lungs: clear Abdomen: obese soft, nontender, nondistended. No hepatosplenomegaly. No bruits or masses. Good bowel sounds. Extremities: no cyanosis, clubbing, rash, 2+ edema Neuro: alert & orientedx3, cranial nerves grossly intact. moves all 4 extremities w/o difficulty. Affect pleasant  ECG: Probable AFL 73 with low volts +PVCs Personally reviewed  Recent Labs: 06/17/2020: Magnesium 2.2 06/21/2020: ALT 59 07/22/2020: B Natriuretic Peptide 280.4 09/16/2020: Hemoglobin 16.3; Platelets 185 02/18/2021: BUN 18; Creatinine, Ser 1.40; Potassium 4.1; Sodium 139  Personally reviewed   Wt Readings from Last 3 Encounters:  05/05/21 (!) 142.3 kg (313 lb 12.8 oz)  02/18/21 (!) 142.4 kg (314 lb)  01/21/21 131 kg (288 lb 12.8 oz)    ASSESSMENT AND PLAN:  1. Combined Systolic/Diastolic Heart Failure- ECHO 02/22/2017 EF 40-45% Grade II DD. Peak PA pressure 46 mmhg. Echo 9/18 EF ~20-25%. Echo 01/2018: EF 40-45%  - Echo 1/20  EF 35-40% with mild RV dysfunction RVSP 40 mmHG - Echo (12/16/19): EF 40-45% Moderate RV dysfunction  - cMRI 8/21 EF 45% + infiltrative process suggestive of possible  amyloidosis - Hospitalization 8/21 with volume depletion and AKI. HF meds cut back. - Worse NYHA III in setting of significant weight gain and volume overload. Weight up 50 pounds in last month. Not sure how much is actually fluid but clearly has edema with R > L HF symptoms.  - Increase torsemide from 20 daily to 40 bid. Add metolazone 2.5 mg daily x 2 days  - If  no response, will need admission or home IV lasix with Remote - Will see back next week  - Continue carvedilol 3.125 mg bid. (Previously cut back with conduction system disease and bradycardia). - Continue spiro 25 mg daily.  - Continue Jardiance 10 mg daily. - Off losartan due to AKI and vomiting. Likely can restart once we straighten out volume issues - Failed Bidil with hypotension.  - Has been following with Dr. Jomarie Longs for Genetics Counseling. Felt to have characteristics for a genetic CM but not to have pathogenic variant for HCM. He has not turned paper work in to enroll in the Cardiomyopathy Study through [facility].  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  - MM panel negative  - PYP 4/22. Ratio 1.26 read as equivocal  - Genetic testing 2021 "uncertain variant for HCM. Negative for TTR"  - ECG with persistent low voltage. Given MRI findings and equivocal PYP suspect next step will be endomyocardial biopsy.   2. Possible cirrhosis, possible cardiac in nature - Suspect related to chronic RV failure in setting of OSA/OHS. +/- fatty liver - RHC (2/21) with only minimally elevated PA pressures (much lower than I would suspect given his degree of cirrhosis on MRI) - Following with [facility], core liver bx (11/21) & abd [place] (1/22) unremarkable. - Struggling with N/V. Continue antiemetics. Has eval at [facility] this week. I wonder if potentially amyloid playing a role  - Followed  Closely by GI and he has been referred to Gastrologist at [facility]   3. Sleep Apnea  - Continue CPAP daily  4. Pulmonary HTN - RV strain on echo.  - RHC in 2/21  minimal PAH with low PAPi. -Needs to continue CPAP   5. Morbid Obesity: - Body mass index is 47.71 kg/m.  - Consider [employer]  6. PVCs - ? If cardiomyopathy in part PVC-related. 24-hour monitor (7/18)  PVCs 6%. - NSR with AV dissociation by EKG previously.  - EP as below.   7. Paroxysmal atrial fib/Atrial flutter  -  S/p a flutter ablation 04/06/18 with Dr. Ladona Ridgel. - Previous EKG NSR with AV dissociation and junctional rhythm at 77 bpm. Has seen Dr. Ladona Ridgel.  - ECG today suggestive of recurrent AFL with controlled VR -- On Eliquis. No bleeding.  8. HTN - BP ok today - Add back losartan in near future   Total time spent 35 minutes. Over half that time spent discussing above.    Signed, Arvilla Meres, MD  05/05/2021 1:41 PM  Advanced Heart Failure Clinic [facility] [address] Heart and Vascular [place] [address] [phone] (office) [phone] (fax)

## 2021-05-05 NOTE — Patient Instructions (Signed)
Increase Torsemide to 40 mg (2 tabs) Twice daily   Start Potassium 20 meq Twice daily, TAKE AN EXTRA 2 TABS WHEN YOU TAKE METOLAZONE  Take Metolazone 2.5 mg Daily FOR 2 DAYS ONLY, take an extra 40 meq (2 tabs) of Potassium on these days as well  Labs done today, your results will be available in MyChart, we will contact you for abnormal readings.  Your physician recommends that you schedule a follow-up appointment in: 1 week with an echocardiogram  If you have any questions or concerns before your next appointment please send [place] a message through [place] or call our office at [phone].    TO LEAVE A MESSAGE FOR THE NURSE SELECT OPTION 2, PLEASE LEAVE A MESSAGE INCLUDING: YOUR NAME DATE OF BIRTH CALL BACK NUMBER REASON FOR CALL**this is important as we prioritize the call backs  YOU WILL RECEIVE A CALL BACK THE SAME DAY AS LONG AS YOU CALL BEFORE 4:00 PM  At the [facility], you and your health needs are our priority. As part of our continuing mission to provide you with exceptional heart care, we have created designated Provider Care Teams. These Care Teams include your primary Cardiologist (physician) and Advanced Practice Providers (APPs- Physician Assistants and Nurse Practitioners) who all work together to provide you with the care you need, when you need it.   You may see any of the following providers on your designated Care Team at your next follow up: Dr Arvilla Meres Dr Marca Ancona Dr Taeveon Melnick, NP Robbie Lis, [place] Mikki Santee Karle Plumber, PharmD   Please be sure to bring in all your medications bottles to every appointment.

## 2021-05-08 ENCOUNTER — Other Ambulatory Visit (HOSPITAL_COMMUNITY): Payer: Self-pay | Admitting: Adult Health

## 2021-05-08 ENCOUNTER — Other Ambulatory Visit (HOSPITAL_COMMUNITY): Payer: Self-pay | Admitting: Internal Medicine

## 2021-05-12 ENCOUNTER — Ambulatory Visit (HOSPITAL_BASED_OUTPATIENT_CLINIC_OR_DEPARTMENT_OTHER)
Admission: RE | Admit: 2021-05-12 | Discharge: 2021-05-12 | Disposition: A | Discharge status: Home / Self Care | Payer: 59 | Source: Ambulatory Visit | Attending: Internal Medicine | Admitting: Internal Medicine

## 2021-05-12 ENCOUNTER — Encounter (HOSPITAL_COMMUNITY): Payer: Self-pay

## 2021-05-12 ENCOUNTER — Ambulatory Visit (HOSPITAL_COMMUNITY)
Admission: RE | Admit: 2021-05-12 | Discharge: 2021-05-12 | Disposition: A | Discharge status: Home / Self Care | Payer: 59 | Source: Ambulatory Visit | Attending: Internal Medicine | Admitting: Internal Medicine

## 2021-05-12 ENCOUNTER — Other Ambulatory Visit (HOSPITAL_COMMUNITY): Payer: Self-pay | Admitting: Internal Medicine

## 2021-05-12 ENCOUNTER — Other Ambulatory Visit: Payer: Self-pay

## 2021-05-12 ENCOUNTER — Encounter (HOSPITAL_COMMUNITY): Payer: Self-pay | Admitting: Internal Medicine

## 2021-05-12 VITALS — BP 120/80 | HR 89 | Wt 247.4 lb

## 2021-05-12 DIAGNOSIS — E860 Dehydration: Secondary | ICD-10-CM | POA: Diagnosis not present

## 2021-05-12 DIAGNOSIS — I4891 Unspecified atrial fibrillation: Secondary | ICD-10-CM

## 2021-05-12 DIAGNOSIS — I272 Pulmonary hypertension, unspecified: Secondary | ICD-10-CM | POA: Diagnosis not present

## 2021-05-12 DIAGNOSIS — G4733 Obstructive sleep apnea (adult) (pediatric): Secondary | ICD-10-CM | POA: Diagnosis not present

## 2021-05-12 DIAGNOSIS — I429 Cardiomyopathy, unspecified: Secondary | ICD-10-CM | POA: Insufficient documentation

## 2021-05-12 DIAGNOSIS — I4892 Unspecified atrial flutter: Secondary | ICD-10-CM | POA: Insufficient documentation

## 2021-05-12 DIAGNOSIS — I5042 Chronic combined systolic (congestive) and diastolic (congestive) heart failure: Secondary | ICD-10-CM | POA: Insufficient documentation

## 2021-05-12 LAB — CBC
HCT: 59.9 % — ABNORMAL HIGH (ref 39.0–52.0)
Hemoglobin: 20 g/dL — ABNORMAL HIGH (ref 13.0–17.0)
MCH: 29.4 pg (ref 26.0–34.0)
MCHC: 33.4 g/dL (ref 30.0–36.0)
MCV: 88 fL (ref 80.0–100.0)
Platelets: 333 10*3/uL (ref 150–400)
RBC: 6.81 MIL/uL — ABNORMAL HIGH (ref 4.22–5.81)
RDW: 12.7 % (ref 11.5–15.5)
WBC: 15.9 10*3/uL — ABNORMAL HIGH (ref 4.0–10.5)
nRBC: 0 % (ref 0.0–0.2)

## 2021-05-12 LAB — COMPREHENSIVE METABOLIC PANEL
ALT: 33 U/L (ref 0–44)
AST: 32 U/L (ref 15–41)
Albumin: 3.4 g/dL — ABNORMAL LOW (ref 3.5–5.0)
Alkaline Phosphatase: 136 U/L — ABNORMAL HIGH (ref 38–126)
Anion gap: 17 — ABNORMAL HIGH (ref 5–15)
BUN: 28 mg/dL — ABNORMAL HIGH (ref 6–20)
CO2: 26 mmol/L (ref 22–32)
Calcium: 9.7 mg/dL (ref 8.9–10.3)
Chloride: 91 mmol/L — ABNORMAL LOW (ref 98–111)
Creatinine, Ser: 2.77 mg/dL — ABNORMAL HIGH (ref 0.61–1.24)
GFR, Estimated: 29 mL/min — ABNORMAL LOW (ref >60)
Glucose, Bld: 115 mg/dL — ABNORMAL HIGH (ref 70–99)
Potassium: 3.9 mmol/L (ref 3.5–5.1)
Sodium: 134 mmol/L — ABNORMAL LOW (ref 135–145)
Total Bilirubin: 7.8 mg/dL — ABNORMAL HIGH (ref 0.3–1.2)
Total Protein: 7.9 g/dL (ref 6.5–8.1)

## 2021-05-12 LAB — ECHOCARDIOGRAM COMPLETE
Area-P 1/2: 5.23 cm2
Calc EF: 37.9 %
MV M vel: 4.95 m/s
MV Peak grad: 98 mmHg
S' Lateral: 3.9 cm
Single Plane A2C EF: 43 %
Single Plane A4C EF: 33.6 %

## 2021-05-12 LAB — BRAIN NATRIURETIC PEPTIDE: B Natriuretic Peptide: 149.8 pg/mL — ABNORMAL HIGH (ref 0.0–100.0)

## 2021-05-12 MED ORDER — TORSEMIDE 20 MG PO TABS: 20.0000 mg | ORAL_TABLET | Freq: Every day | ORAL | 3 refills | Status: DC

## 2021-05-12 NOTE — Progress Notes (Signed)
  Echocardiogram 2D Echocardiogram has been performed.  Bradley Powell 05/12/2021, 11:44 AM

## 2021-05-12 NOTE — Patient Instructions (Signed)
Restart Torsemide at 20 mg Daily STARTING ON Monday 05/17/21  Do the following things EVERYDAY: Weigh yourself in the morning before breakfast. Write it down and keep it in a log. Take your medicines as prescribed Eat low salt foods--Limit salt (sodium) to 2000 mg per day.  Stay as active as you can everyday Limit all fluids for the day to less than 2 liters  Your physician recommends that you schedule a follow-up appointment in: 1 week  If you have any questions or concerns before your next appointment please send [place] a message through [place] or call our office at [phone].    TO LEAVE A MESSAGE FOR THE NURSE SELECT OPTION 2, PLEASE LEAVE A MESSAGE INCLUDING: YOUR NAME DATE OF BIRTH CALL BACK NUMBER REASON FOR CALL**this is important as we prioritize the call backs  YOU WILL RECEIVE A CALL BACK THE SAME DAY AS LONG AS YOU CALL BEFORE 4:00 PM  At the [facility], you and your health needs are our priority. As part of our continuing mission to provide you with exceptional heart care, we have created designated Provider Care Teams. These Care Teams include your primary Cardiologist (physician) and Advanced Practice Providers (APPs- Physician Assistants and Nurse Practitioners) who all work together to provide you with the care you need, when you need it.   You may see any of the following providers on your designated Care Team at your next follow up: Dr Arvilla Meres Dr Marca Ancona Dr Zaydin Melnick, NP Robbie Lis, [place] Mikki Santee Karle Plumber, PharmD   Please be sure to bring in all your medications bottles to every appointment.

## 2021-05-12 NOTE — Progress Notes (Signed)
As ordered by MD,labs drawn stat and sent to lab. Peripheral iv inserted into right forearm. Iv fluids of NS started as ordered.Patient given call bell.

## 2021-05-12 NOTE — Progress Notes (Unsigned)
Patient completed 1 liter of Ns as ordered. Patient voided 50 cc of  urine. As per Dr. [name]  additional liter of NS to be given.

## 2021-05-12 NOTE — Progress Notes (Signed)
Patient completed 2 nd liter of fluids. Dr. [name] reviewed lab results and patient ok for d/c. D/c instructions reviewed aware of follow up appointment for next week. Pt left ambulating in no distress.

## 2021-05-12 NOTE — Progress Notes (Signed)
Pt completed 1 liter of NS as ordered as ordered. Patient voided 50cc of urine. As ordered by Dr. [name] patient to receive  an additional  1 liter of NS. Vital signs remain stable.

## 2021-05-12 NOTE — Progress Notes (Signed)
Advanced Heart Failure Clinic Note   PCP:  [name], MD  Primary HF: Dr. [name] GI : Dr Marko Plume.   Chief Complaint: Heart Fialure    History of Present Illness:  Bradley Powell is a 40 y.o. male with a history of combined systolic/diastolic heart failure,  obesity, HTN, and gout.   Admitted 4/13 through 03/07/17 with marked volume overload. EF 40-45%. Diuresed with IV lasix tid + metolazone. Had RHC/LHC as noted below. Overall diuresed 45 pounds. Discharge weight was 281 pounds.    He underwent atrial flutter ablation on 04/06/18. Subsequently had marked 1st AV block with progression to junctional rhythm. Saw Dr. Ladona Ridgel who wanted to follow it. Suggested cutting back carvedilol.  Previously Bidil stopped due to hypotension.  Admitted  8/21 with intractable n/v of unclear etiology, hypotension and AKI. Creatinine peaked at 4.5.Back to 1.6 at d/c. Has been followed by GI.   cMRI 7/21 EF 45% suspect infiltrative CM. ECV 55%  Today he returns for HF follow up. I saw him last week. Weight up over 50 pounds. I increased torsemide from 20 daily to 40 bid and added metolazone 2.5 mg daily x 2 days (he took for 5 days). He is down 65 pounds. Now feels weak and dehydrated. Having nausea at times but improving. No dizziness or syncope. No confusion.   Cardiac Studies  cMRI (7/21): 1. Findings suggestive of infiltrative cardiomyopathy. Native T1 ECV 55%, with diffuse post contrast delayed myocardial enhancement and abnormal T1 myocardial nulling kinetics, consistent with a diagnosis of cardiac amyloidosis. 2.  Upper limit of normal biventricular chamber size. 3. Mildly reduced biventricular systolic function. LVEF 45%, RVEF 44%. 4. Cannot exclude possible anomalous pulmonary venous drainage in to the mid SVC. Seen on axial HASTE image series 3 image 4, but evaluation limited due to poor spatial resolution. Consider ECG gated CTA chest for further evaluation.  -  Echo 2/21: EF 40-45%  - Echo 1/20: EF 35-40% - Echo 9/18: EF ~25-30%.  - Echo 3/19: EF 45-50%  - Holter 7/18: 6% PVCs - Sleep study (10/18): AHI 26/hr  - RHC (2/21): RA = 14 RV = 43/15 PA = 43/21 (31) PCW = 15 Fick cardiac output/index = 5.7/2.4 PVR = 2.8 WU Ao sat = 97% PA sat = 73%, 75% SVC sat = 67%   - R/LHC (4/18): AO = 109/86 (97) LV =  101/28 RA =  29 RV = 50/22 PA = 55/24 (38) PCW = 26 Fick cardiac output/index = 6.4/2.6 Thermal CO/CI = 4.9/2.0 PVR = 2.5 WU FA sat = 98% PA sat = 69%, 71% SVC sat = 82%, 82% IVC sat = 66%  Assessment: 1. Normal coronary arteries  2. Mild to moderate pulmonary HTN with normal PVR 3. Biventricular volume overload with R > L heart failure 4. Moderately decreased CO by thermodilution  5. 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 No intra-cardiac shunt   Past Medical History:  Diagnosis Date   Atrial flutter (HCC)    a. s/p ablation 03/2018.   Chronic combined systolic and diastolic CHF (congestive heart failure) (HCC)    History of esophagogastroduodenoscopy (EGD)    Morbid obesity (HCC)    NICM (nonischemic cardiomyopathy) (HCC)    PVC's (premature ventricular contractions)    Sleep apnea    Past Surgical History:  Procedure Laterality Date   A-FLUTTER ABLATION N/A 04/06/2018   Procedure:  A-FLUTTER ABLATION;  Surgeon: Marinus Maw, MD;  Location: [facility] INVASIVE CV LAB;  Service: Cardiovascular;  Laterality: N/A;   ESOPHAGEAL BRUSHING  06/19/2020   Procedure: ESOPHAGEAL BRUSHING;  Surgeon: Charlott Rakes, MD;  Location: [facility] ENDOSCOPY;  Service: Endoscopy;;   ESOPHAGOGASTRODUODENOSCOPY (EGD) WITH PROPOFOL Left 06/19/2020   Procedure: ESOPHAGOGASTRODUODENOSCOPY (EGD) WITH PROPOFOL;  Surgeon: Charlott Rakes, MD;  Location: [facility] ENDOSCOPY;  Service: Endoscopy;  Laterality: Left;   NO PAST SURGERIES     RIGHT HEART CATH N/A 12/20/2019   Procedure:  RIGHT HEART CATH;  Surgeon: Dolores Patty, MD;  Location: [facility] INVASIVE CV LAB;  Service: Cardiovascular;  Laterality: N/A;   RIGHT/LEFT HEART CATH AND CORONARY ANGIOGRAPHY N/A 03/01/2017   Procedure: Right/Left Heart Cath and Coronary Angiography;  Surgeon: Dolores Patty, MD;  Location: [facility] INVASIVE CV LAB;  Service: Cardiovascular;  Laterality: N/A;    Current Outpatient Medications  Medication Sig Dispense Refill   allopurinol (ZYLOPRIM) 100 MG tablet Take 2 tablets (200 mg total) by mouth daily. Please contact PCP for further refills 60 tablet 0   carvedilol (COREG) 3.125 MG tablet TAKE 1 TABLET (3.125 MG TOTAL) BY MOUTH 2 (TWO) TIMES DAILY WITH A MEAL. 180 tablet 1   ELIQUIS 5 MG TABS tablet TAKE 1 TABLET BY MOUTH TWICE A DAY 60 tablet 6   JARDIANCE 10 MG TABS tablet TAKE 1 TABLET BY MOUTH EVERY DAY BEFORE BREAKFAST. 30 tablet 11   KLOR-CON M20 20 MEQ tablet TAKE 1 TABLET BY MOUTH 2 TIMES DAILY. TAKE AN EXTRA 2 TABS WHEN YOU TAKE METOLAZONE 225 tablet 1   metolazone (ZAROXOLYN) 2.5 MG tablet Take 1 tablet (2.5 mg total) by mouth as directed. By the HF clinic 5 tablet 0   MITIGARE 0.6 MG CAPS Take 0.6 mg by mouth daily as needed (gout flare). 30 capsule 5   ondansetron (ZOFRAN ODT) 4 MG disintegrating tablet Take 1 tablet (4 mg total) by mouth every 8 (eight) hours as needed for nausea or vomiting. 20 tablet 0   pantoprazole (PROTONIX) 40 MG tablet Take 40 mg by mouth daily.     promethazine (PROMETHEGAN) 12.5 MG suppository Place 1 suppository (12.5 mg total) rectally every 6 (six) hours as needed for nausea or vomiting. 12 each 0   spironolactone (ALDACTONE) 25 MG tablet TAKE 1 TABLET BY MOUTH EVERYDAY AT BEDTIME 90 tablet 3   sucralfate (CARAFATE) 1 g tablet Take 1 g by mouth daily.      COVID-19 mRNA Vac-TriS, Pfizer, (PFIZER-BIONT COVID-19 VAC-TRIS) SUSP injection Inject into the muscle. 0.3 mL 0   torsemide (DEMADEX) 20 MG tablet Take 1 tablet (20 mg total) by mouth daily. 120  tablet 3   No current facility-administered medications for this encounter.    Allergies:   Entresto [sacubitril-valsartan]   Social History:  The patient  reports that he has never smoked. He has never used smokeless tobacco. He reports current alcohol use. He reports that he does not use drugs.   Family History:  The patient's family history includes CVA in his mother; Gout in his father; Hypertension in his father; Multiple sclerosis in his mother; Seizures in his mother.   ROS:  Please see the history of present illness.   All other systems are personally reviewed and negative.   Vitals:   05/12/21 1210 05/12/21 1616  BP: 112/80 120/80  Pulse: (!) 103 89  SpO2: 94% 94%  Weight: 112.2 kg (247 lb 6.4 oz)    Wt Readings from Last  3 Encounters:  05/12/21 112.2 kg (247 lb 6.4 oz)  05/05/21 (!) 142.3 kg (313 lb 12.8 oz)  02/18/21 (!) 142.4 kg (314 lb)   Exam:   General:  Weak appearing. No resp difficulty HEENT: normal Neck: supple. no JVD. Carotids 2+ bilat; no bruits. No lymphadenopathy or thryomegaly appreciated. Cor: PMI nondisplaced. Irregular tachy  No rubs, gallops or murmurs. Lungs: clear Abdomen: obese soft, nontender, nondistended. No hepatosplenomegaly. No bruits or masses. Good bowel sounds. Extremities: no cyanosis, clubbing, rash, edema Neuro: alert & orientedx3, cranial nerves grossly intact. moves all 4 extremities w/o difficulty. Affect pleasant   ECG: AF 105 Personally reviewed   Recent Labs: 06/17/2020: Magnesium 2.2 05/12/2021: ALT 33; B Natriuretic Peptide 149.8; BUN 28; Creatinine, Ser 2.77; Hemoglobin 20.0; Platelets 333; Potassium 3.9; Sodium 134  Personally reviewed   Wt Readings from Last 3 Encounters:  05/12/21 112.2 kg (247 lb 6.4 oz)  05/05/21 (!) 142.3 kg (313 lb 12.8 oz)  02/18/21 (!) 142.4 kg (314 lb)    ASSESSMENT AND PLAN:  1. Combined Systolic/Diastolic Heart Failure- ECHO 02/22/2017 EF 40-45% Grade II DD. Peak PA pressure 46 mmhg. Echo  9/18 EF ~20-25%. Echo 01/2018: EF 40-45%  - Echo 1/20  EF 35-40% with mild RV dysfunction RVSP 40 mmHG - Echo (12/16/19): EF 40-45% Moderate RV dysfunction  - cMRI 8/21 EF 45% + infiltrative process suggestive of possible amyloidosis - Hospitalization 8/21 with volume depletion and AKI. HF meds cut back. - We saw him last week and he was up 50+ pounds. Diuretics adusted and now down almost 70 pounds in 1 week.  - He appears volume depleted.  - Will give 2L IVF in clinic and assess response and labs. - Can restart torsemide at 20 daily (previous baseline dose) on 05/17/21. Adjust as needed  - Continue carvedilol 3.125 mg bid. (Previously cut back with conduction system disease and bradycardia). - Continue spiro 25 mg daily.  - Continue Jardiance 10 mg daily. - Off losartan due to AKI and vomiting. Likely can restart once we straighten out volume issues - Failed Bidil with hypotension.  - Has been following with Dr. Jomarie Longs for Genetics Counseling. Felt to have characteristics for a genetic CM but not to have pathogenic variant for HCM. -Genetic testing 2021 "uncertain variant for HCM. Negative for TTR"  - cMRI raises concern for TTR amyloid.  - MM panel negative  - PYP 4/22. Ratio 1.26 read as equivocal  -  Given MRI findings and equivocal PYP suspect next step will need to consider endomyocardial biopsy.   2. Possible cirrhosis, possible cardiac in nature - Suspect related to chronic RV failure in setting of OSA/OHS. +/- fatty liver - RHC (2/21) with only minimally elevated PA pressures (much lower than I would suspect given his degree of cirrhosis on MRI) - Following with [facility], core liver bx (11/21) & abd [place] (1/22) unremarkable. - Recently struggling with N/V. Has been seen at [facility] I wonder if potentially amyloid playing a role  - Followed by GI  3. Sleep Apnea  - Continue CPAP daily  4. Pulmonary HTN - RV strain on echo.  - RHC in 2/21 minimal PAH with low PAPi. -Needs to continue  CPAP   5. Morbid Obesity: - Body mass index is 37.62 kg/m.  - Consider [employer]  6. PVCs - ? If cardiomyopathy in part PVC-related. 24-hour monitor (7/18)  PVCs 6%. - NSR with AV dissociation by EKG previously.  - EP as below.  7. Paroxysmal atrial fib/Atrial flutter  - S/p a flutter ablation 04/06/18 with Dr. Ladona Ridgel. - Previous EKG NSR with AV dissociation and junctional rhythm at 77 bpm. Has seen Dr. Ladona Ridgel.  - ECG today with AF - Continue rate control strategy for now -- On Eliquis. No bleeding.  8. HTN - BP down today in setting of volume depletion   Total time spent 55 minutes. Over half that time spent discussing/evaluating above.    Signed, Arvilla Meres, MD  05/20/2021 10:32 PM  Advanced Heart Failure Clinic [facility] [address] Heart and Vascular [place] [address] [phone] (office) [phone] (fax)

## 2021-05-18 ENCOUNTER — Other Ambulatory Visit: Payer: Self-pay | Admitting: Gastroenterology

## 2021-05-18 DIAGNOSIS — K7460 Unspecified cirrhosis of liver: Secondary | ICD-10-CM

## 2021-05-19 ENCOUNTER — Other Ambulatory Visit: Payer: Self-pay | Admitting: Gastroenterology

## 2021-05-19 DIAGNOSIS — R112 Nausea with vomiting, unspecified: Secondary | ICD-10-CM

## 2021-05-21 ENCOUNTER — Ambulatory Visit (HOSPITAL_COMMUNITY)
Admission: RE | Admit: 2021-05-21 | Discharge: 2021-05-21 | Disposition: A | Discharge status: Home / Self Care | Payer: 59 | Source: Ambulatory Visit | Attending: Family Medicine | Admitting: Family Medicine

## 2021-05-21 ENCOUNTER — Other Ambulatory Visit: Payer: Self-pay

## 2021-05-21 ENCOUNTER — Encounter (HOSPITAL_COMMUNITY): Payer: Self-pay

## 2021-05-21 VITALS — BP 102/70 | HR 67 | Wt 256.4 lb

## 2021-05-21 DIAGNOSIS — G4733 Obstructive sleep apnea (adult) (pediatric): Secondary | ICD-10-CM

## 2021-05-21 DIAGNOSIS — I272 Pulmonary hypertension, unspecified: Secondary | ICD-10-CM | POA: Diagnosis not present

## 2021-05-21 DIAGNOSIS — I493 Ventricular premature depolarization: Secondary | ICD-10-CM

## 2021-05-21 DIAGNOSIS — I5042 Chronic combined systolic (congestive) and diastolic (congestive) heart failure: Secondary | ICD-10-CM

## 2021-05-21 DIAGNOSIS — K761 Chronic passive congestion of liver: Secondary | ICD-10-CM

## 2021-05-21 DIAGNOSIS — I4892 Unspecified atrial flutter: Secondary | ICD-10-CM | POA: Diagnosis not present

## 2021-05-21 DIAGNOSIS — Z6838 Body mass index (BMI) 38.0-38.9, adult: Secondary | ICD-10-CM | POA: Diagnosis not present

## 2021-05-21 DIAGNOSIS — I110 Hypertensive heart disease with heart failure: Secondary | ICD-10-CM | POA: Diagnosis not present

## 2021-05-21 DIAGNOSIS — I480 Paroxysmal atrial fibrillation: Secondary | ICD-10-CM | POA: Diagnosis not present

## 2021-05-21 DIAGNOSIS — Z8249 Family history of ischemic heart disease and other diseases of the circulatory system: Secondary | ICD-10-CM | POA: Diagnosis not present

## 2021-05-21 DIAGNOSIS — G4730 Sleep apnea, unspecified: Secondary | ICD-10-CM | POA: Insufficient documentation

## 2021-05-21 DIAGNOSIS — Z7984 Long term (current) use of oral hypoglycemic drugs: Secondary | ICD-10-CM | POA: Insufficient documentation

## 2021-05-21 DIAGNOSIS — Z79899 Other long term (current) drug therapy: Secondary | ICD-10-CM | POA: Insufficient documentation

## 2021-05-21 DIAGNOSIS — K760 Fatty (change of) liver, not elsewhere classified: Secondary | ICD-10-CM | POA: Diagnosis not present

## 2021-05-21 DIAGNOSIS — Z7901 Long term (current) use of anticoagulants: Secondary | ICD-10-CM | POA: Insufficient documentation

## 2021-05-21 DIAGNOSIS — I10 Essential (primary) hypertension: Secondary | ICD-10-CM

## 2021-05-21 LAB — BASIC METABOLIC PANEL
Anion gap: 8 (ref 5–15)
BUN: 9 mg/dL (ref 6–20)
CO2: 31 mmol/L (ref 22–32)
Calcium: 9.3 mg/dL (ref 8.9–10.3)
Chloride: 98 mmol/L (ref 98–111)
Creatinine, Ser: 1.43 mg/dL — ABNORMAL HIGH (ref 0.61–1.24)
GFR, Estimated: >60 mL/min (ref >60)
Glucose, Bld: 96 mg/dL (ref 70–99)
Potassium: 3.8 mmol/L (ref 3.5–5.1)
Sodium: 137 mmol/L (ref 135–145)

## 2021-05-21 NOTE — Progress Notes (Addendum)
Advanced Heart Failure Clinic Note   PCP:  [name], MD  GI : Dr Marko Plume.  Primary HF: Dr. [name]  Chief Complaint: Heart Fialure    History of Present Illness:  Bradley Powell is a 40 y.o. male with a history of combined systolic/diastolic heart failure,  obesity, HTN, and gout.   Admitted 4/13 through 03/07/17 with marked volume overload. EF 40-45%. Diuresed with IV lasix tid + metolazone. Had RHC/LHC as noted below. Overall diuresed 45 pounds. Discharge weight was 281 pounds.    He underwent atrial flutter ablation on 04/06/18. Subsequently had marked 1st AV block with progression to junctional rhythm. Saw Dr. Ladona Ridgel who wanted to follow it. Suggested cutting back carvedilol.  Previously Bidil stopped due to hypotension.  Admitted  8/21 with intractable n/v of unclear etiology, hypotension and AKI. Creatinine peaked at 4.5.Back to 1.6 at d/c. Has been followed by GI.   cMRI 7/21 EF 45% suspect infiltrative CM. ECV 55%  He returned for HF follow up 6/22 weight up over 50 pounds. Torsemide increased from 20 daily to 40 bid + metolazone 2.5 mg daily x 2 days (he took for 5 days). He was down 65 pounds. Given 2L IVF in clinic and restart torsemide 05/17/21.  Today he returns for HF follow up. Overall feeling better. Denies increasing SOB, CP, dizziness, edema, or PND/Orthopnea. Appetite ok. No fever or chills. Weight at home 251 pounds. Taking all medications.  GI MD ordered liver U/S 06/14/21, N/V better. Weight trending back up.  Cardiac Studies  cMRI (7/21): 1. Findings suggestive of infiltrative cardiomyopathy. Native T1 ECV 55%, with diffuse post contrast delayed myocardial enhancement and abnormal T1 myocardial nulling kinetics, consistent with a diagnosis of cardiac amyloidosis. 2.  Upper limit of normal biventricular chamber size. 3. Mildly reduced biventricular systolic function. LVEF 45%, RVEF 44%. 4. Cannot exclude possible anomalous pulmonary  venous drainage in to the mid SVC. Seen on axial HASTE image series 3 image 4, but evaluation limited due to poor spatial resolution. Consider ECG gated CTA chest for further evaluation.  - Echo 2/21: EF 40-45%  - Echo 1/20: EF 35-40% - Echo 9/18: EF ~25-30%.  - Echo 3/19: EF 45-50%  - Holter 7/18: 6% PVCs - Sleep study (10/18): AHI 26/hr  - RHC (2/21): RA = 14 RV = 43/15 PA = 43/21 (31) PCW = 15 Fick cardiac output/index = 5.7/2.4 PVR = 2.8 WU Ao sat = 97% PA sat = 73%, 75% SVC sat = 67%   - R/LHC (4/18): AO = 109/86 (97) LV =  101/28 RA =  29 RV = 50/22 PA = 55/24 (38) PCW = 26 Fick cardiac output/index = 6.4/2.6 Thermal CO/CI = 4.9/2.0 PVR = 2.5 WU FA sat = 98% PA sat = 69%, 71% SVC sat = 82%, 82% IVC sat = 66%  Assessment: 1. Normal coronary arteries  2. Mild to moderate pulmonary HTN with normal PVR 3. Biventricular volume overload with R > L heart failure 4. Moderately decreased CO by thermodilution  5. 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 No intra-cardiac shunt  Past Medical History:  Diagnosis Date   Atrial flutter (HCC)    a. s/p ablation 03/2018.   Chronic combined systolic and diastolic CHF (congestive heart failure) (HCC)    History of esophagogastroduodenoscopy (EGD)    Morbid obesity (HCC)    NICM (nonischemic cardiomyopathy) (HCC)  PVC's (premature ventricular contractions)    Sleep apnea    Past Surgical History:  Procedure Laterality Date   A-FLUTTER ABLATION N/A 04/06/2018   Procedure: A-FLUTTER ABLATION;  Surgeon: Marinus Maw, MD;  Location: [facility] INVASIVE CV LAB;  Service: Cardiovascular;  Laterality: N/A;   ESOPHAGEAL BRUSHING  06/19/2020   Procedure: ESOPHAGEAL BRUSHING;  Surgeon: Charlott Rakes, MD;  Location: [facility] ENDOSCOPY;  Service: Endoscopy;;   ESOPHAGOGASTRODUODENOSCOPY (EGD) WITH PROPOFOL Left 06/19/2020   Procedure:  ESOPHAGOGASTRODUODENOSCOPY (EGD) WITH PROPOFOL;  Surgeon: Charlott Rakes, MD;  Location: [facility] ENDOSCOPY;  Service: Endoscopy;  Laterality: Left;   NO PAST SURGERIES     RIGHT HEART CATH N/A 12/20/2019   Procedure: RIGHT HEART CATH;  Surgeon: Dolores Patty, MD;  Location: [facility] INVASIVE CV LAB;  Service: Cardiovascular;  Laterality: N/A;   RIGHT/LEFT HEART CATH AND CORONARY ANGIOGRAPHY N/A 03/01/2017   Procedure: Right/Left Heart Cath and Coronary Angiography;  Surgeon: Dolores Patty, MD;  Location: [facility] INVASIVE CV LAB;  Service: Cardiovascular;  Laterality: N/A;    Current Outpatient Medications  Medication Sig Dispense Refill   allopurinol (ZYLOPRIM) 100 MG tablet Take 2 tablets (200 mg total) by mouth daily. Please contact PCP for further refills 60 tablet 0   carvedilol (COREG) 3.125 MG tablet TAKE 1 TABLET (3.125 MG TOTAL) BY MOUTH 2 (TWO) TIMES DAILY WITH A MEAL. 180 tablet 1   COVID-19 mRNA Vac-TriS, Pfizer, (PFIZER-BIONT COVID-19 VAC-TRIS) SUSP injection Inject into the muscle. 0.3 mL 0   ELIQUIS 5 MG TABS tablet TAKE 1 TABLET BY MOUTH TWICE A DAY 60 tablet 6   JARDIANCE 10 MG TABS tablet TAKE 1 TABLET BY MOUTH EVERY DAY BEFORE BREAKFAST. 30 tablet 11   KLOR-CON M20 20 MEQ tablet TAKE 1 TABLET BY MOUTH 2 TIMES DAILY. TAKE AN EXTRA 2 TABS WHEN YOU TAKE METOLAZONE 225 tablet 1   ondansetron (ZOFRAN ODT) 4 MG disintegrating tablet Take 1 tablet (4 mg total) by mouth every 8 (eight) hours as needed for nausea or vomiting. 20 tablet 0   pantoprazole (PROTONIX) 40 MG tablet Take 40 mg by mouth daily.     promethazine (PROMETHEGAN) 12.5 MG suppository Place 1 suppository (12.5 mg total) rectally every 6 (six) hours as needed for nausea or vomiting. 12 each 0   spironolactone (ALDACTONE) 25 MG tablet TAKE 1 TABLET BY MOUTH EVERYDAY AT BEDTIME 90 tablet 3   sucralfate (CARAFATE) 1 g tablet Take 1 g by mouth daily.      torsemide (DEMADEX) 20 MG tablet Take 1 tablet (20 mg total) by mouth  daily. 120 tablet 3   MITIGARE 0.6 MG CAPS Take 0.6 mg by mouth daily as needed (gout flare). 30 capsule 5   No current facility-administered medications for this encounter.    Allergies:   Entresto [sacubitril-valsartan]   Social History:  The patient  reports that he has never smoked. He has never used smokeless tobacco. He reports current alcohol use. He reports that he does not use drugs.   Family History:  The patient's family history includes CVA in his mother; Gout in his father; Hypertension in his father; Multiple sclerosis in his mother; Seizures in his mother.   ROS:  Please see the history of present illness.   All other systems are personally reviewed and negative.   BP 102/70   Pulse 67   Wt 116.3 kg (256 lb 6.4 oz)   SpO2 98%   BMI 38.99 kg/m   Recent Labs: 06/17/2020: Magnesium 2.2  05/12/2021: ALT 33; B Natriuretic Peptide 149.8; BUN 28; Creatinine, Ser 2.77; Hemoglobin 20.0; Platelets 333; Potassium 3.9; Sodium 134  Personally reviewed   Wt Readings from Last 3 Encounters:  05/21/21 116.3 kg (256 lb 6.4 oz)  05/12/21 112.2 kg (247 lb 6.4 oz)  05/05/21 (!) 142.3 kg (313 lb 12.8 oz)    Exam:   General:  NAD. No resp difficulty HEENT: Normal Neck: Supple. No JVD. Carotids 2+ bilat; no bruits. No lymphadenopathy or thryomegaly appreciated. Cor: PMI nondisplaced. Regular rate & rhythm. No rubs, gallops or murmurs. Lungs: Clear Abdomen: Obese, nontender, nondistended. No hepatosplenomegaly. No bruits or masses. Good bowel sounds. Extremities: No cyanosis, clubbing, rash, edema Neuro: Alert & oriented x 3, cranial nerves grossly intact. Moves all 4 extremities w/o difficulty. Affect pleasant.  ECG: Atrial fibrillation w/ occasional PVC, 96 bpm (personally reviewed). REDs: 36%  ASSESSMENT AND PLAN:  1. Combined Systolic/Diastolic Heart Failure- ECHO 02/22/2017 EF 40-45% Grade II DD. Peak PA pressure 46 mmhg. Echo 9/18 EF ~20-25%. Echo 01/2018: EF 40-45%  - Echo 1/20   EF 35-40% with mild RV dysfunction RVSP 40 mmHG - Echo (12/16/19): EF 40-45% Moderate RV dysfunction  - cMRI 8/21 EF 45% + infiltrative process suggestive of possible amyloidosis - Hospitalization 8/21 with volume depletion and AKI. HF meds cut back. - We saw him 2 weeks ago and he was up 50+ pounds. Diuretics adusted and down almost 70 pounds in 1 week. He was volume depleted and given 2L IVF in clinic. - NYHA III. Volume better today, weight up 9 lbs from last week. - Continue torsemide 20 daily. - Continue carvedilol 3.125 mg bid. (Previously cut back with conduction system disease and bradycardia). - Continue spiro 25 mg daily.  - Continue Jardiance 10 mg daily. - Off losartan due to AKI and vomiting. Likely can restart once we straighten out volume issues - Failed Bidil with hypotension.  - Has been following with Dr. Jomarie Longs for Genetics Counseling. Felt to have characteristics for a genetic CM but not to have pathogenic variant for HCM.  - Genetic testing 2021 "uncertain variant for HCM. Negative for TTR"  - cMRI raises concern for TTR amyloid.  - MM panel negative.  - PYP 4/22. Ratio 1.26 read as equivocal.  - Given MRI findings and equivocal PYP suspect next step will need to consider endomyocardial biopsy.  - BMET today.  2. Possible cirrhosis, possible cardiac in nature - Suspect related to chronic RV failure in setting of OSA/OHS. +/- fatty liver. - RHC (2/21) with only minimally elevated PA pressures (much lower than I would suspect given his degree of cirrhosis on MRI). - Following with [facility], core liver bx (11/21) & abd [place] (1/22) unremarkable. - Recently struggling with N/V. Has been seen at [facility] I wonder if potentially amyloid playing a role.   3. Sleep Apnea  - Continue CPAP.  4. Pulmonary HTN - RV strain on echo.  - RHC in 2/21 minimal PAH with low PAPi. - Needs to continue CPAP.   5. Morbid Obesity: - Body mass index is 38.99 kg/m.  - Consider [employer].  6. PVCs - ? If cardiomyopathy in part PVC-related. 24-hour monitor (7/18)  PVCs 6%. - NSR with AV dissociation by EKG previously.  - EP as below.   7. Paroxysmal atrial fib/Atrial flutter  - S/p a flutter ablation 04/06/18 with Dr. Ladona Ridgel. - Previous EKG NSR with AV dissociation and junctional rhythm at 77 bpm.   - ECG  today with AF. - Continue rate control strategy for now. - On Eliquis. No bleeding.  8. HTN - Controlled. - Hopefully we can add back low-dose losartan soon.  Follow back 3-4 weeks with APP.  Signed, Jacklynn Ganong, FNP  05/21/2021 2:30 PM  Advanced Heart Failure Clinic [facility] [address] Heart and Vascular [place] [address] [phone] (office) [phone] (fax)

## 2021-05-21 NOTE — Patient Instructions (Signed)
It was great to see you today! No medication changes are needed at this time.  Labs today We will only contact you if something comes back abnormal or we need to make some changes. Otherwise no news is good news!  Your physician recommends that you schedule a follow-up appointment in: 3-4 weeks  in the Advanced Practitioners (PA/NP) Clinic   Do the following things EVERYDAY: Weigh yourself in the morning before breakfast. Write it down and keep it in a log. Take your medicines as prescribed Eat low salt foods--Limit salt (sodium) to 2000 mg per day.  Stay as active as you can everyday Limit all fluids for the day to less than 2 liters  At the Advanced Heart Failure Clinic, you and your health needs are our priority. As part of our continuing mission to provide you with exceptional heart care, we have created designated Provider Care Teams. These Care Teams include your primary Cardiologist (physician) and Advanced Practice Providers (APPs- Physician Assistants and Nurse Practitioners) who all work together to provide you with the care you need, when you need it.   You may see any of the following providers on your designated Care Team at your next follow up: Dr Arvilla Meres Dr Marca Ancona Dr Yossef Melnick, NP Robbie Lis, [place] Mikki Santee Karle Plumber, PharmD   Please be sure to bring in all your medications bottles to every appointment.   If you have any questions or concerns before your next appointment please send [place] a message through [place] or call our office at [phone].    TO LEAVE A MESSAGE FOR THE NURSE SELECT OPTION 2, PLEASE LEAVE A MESSAGE INCLUDING: YOUR NAME DATE OF BIRTH CALL BACK NUMBER REASON FOR CALL**this is important as we prioritize the call backs  YOU WILL RECEIVE A CALL BACK THE SAME DAY AS LONG AS YOU CALL BEFORE 4:00 PM

## 2021-05-21 NOTE — Progress Notes (Signed)
ReDS Vest / Clip - 05/21/21 1500       ReDS Vest / Clip   Station Marker D    Ruler Value 36    ReDS Value Range Moderate volume overload    ReDS Actual Value 36

## 2021-05-26 ENCOUNTER — Encounter (HOSPITAL_COMMUNITY): Payer: 59

## 2021-06-14 ENCOUNTER — Ambulatory Visit
Admission: RE | Admit: 2021-06-14 | Discharge: 2021-06-14 | Disposition: A | Discharge status: Home / Self Care | Payer: 59 | Source: Ambulatory Visit | Attending: Gastroenterology | Admitting: Gastroenterology

## 2021-06-14 DIAGNOSIS — R112 Nausea with vomiting, unspecified: Secondary | ICD-10-CM

## 2021-06-14 IMAGING — US US ABDOMEN COMPLETE
1 series · 14 of 25 positions shown · non-contrast
Comparison: Ultrasound [DATE].

CLINICAL DATA: Two weeks of nausea and vomiting.

EXAM:
ABDOMEN ULTRASOUND COMPLETE

[Series 1: us abdomen complete · 0.20mm/px · 14 of 98 slices shown]
[im 1/98]
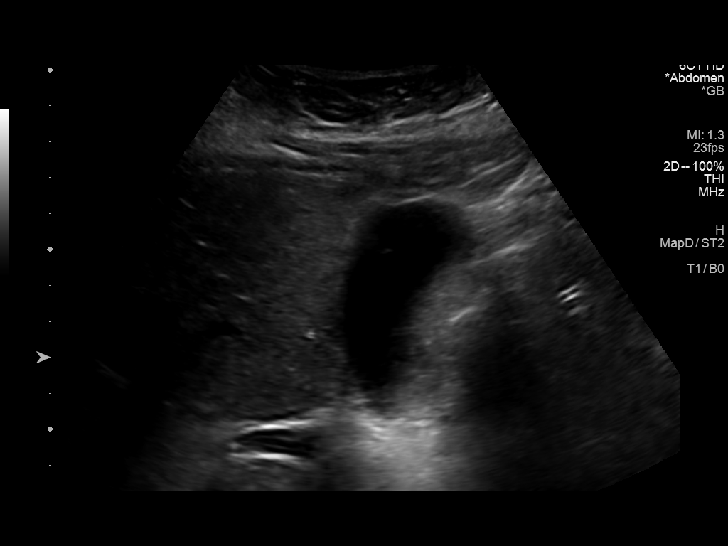
[im 9/98]
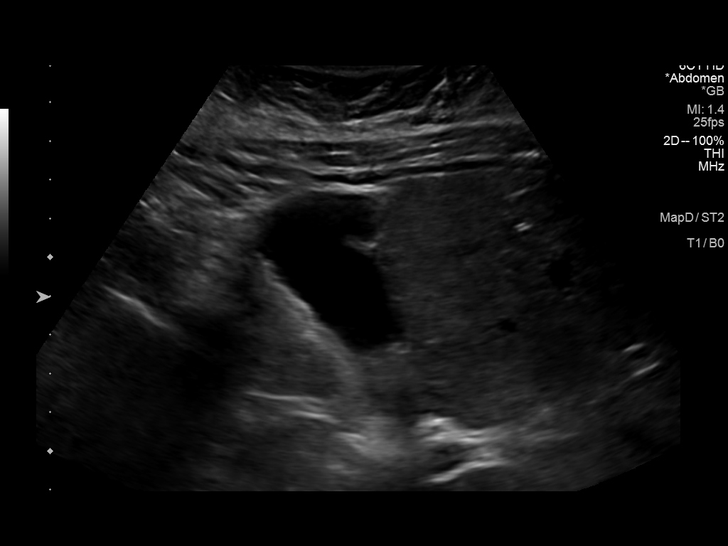
[im 17/98]
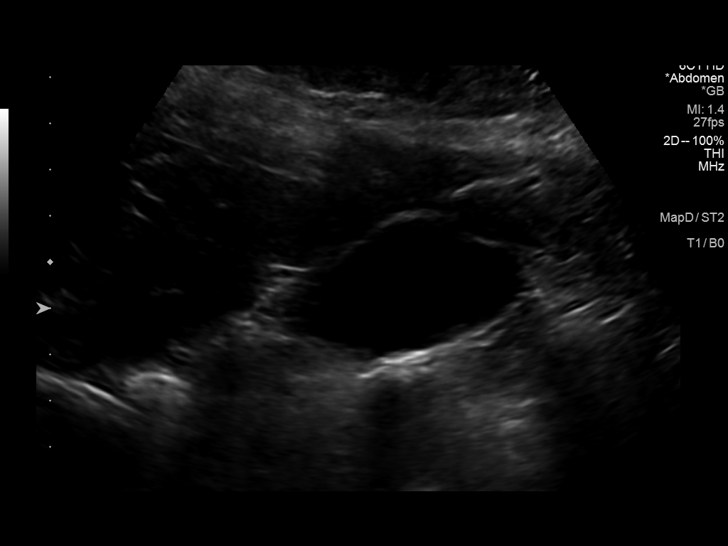
[im 25/98]
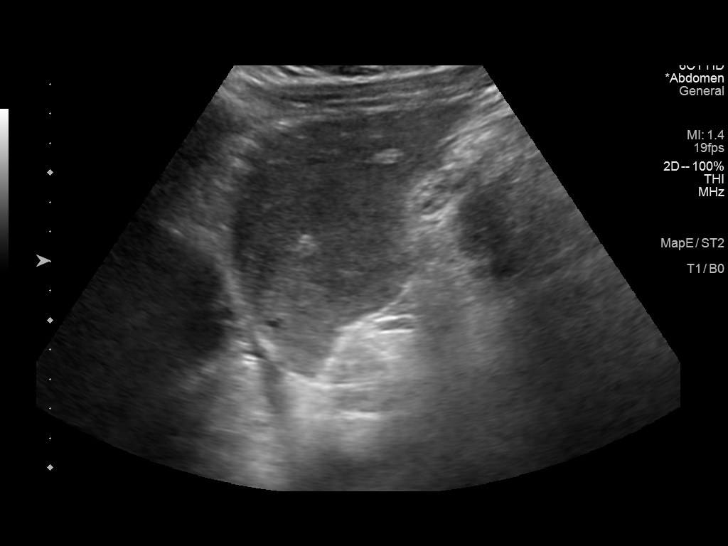
[im 33/98]
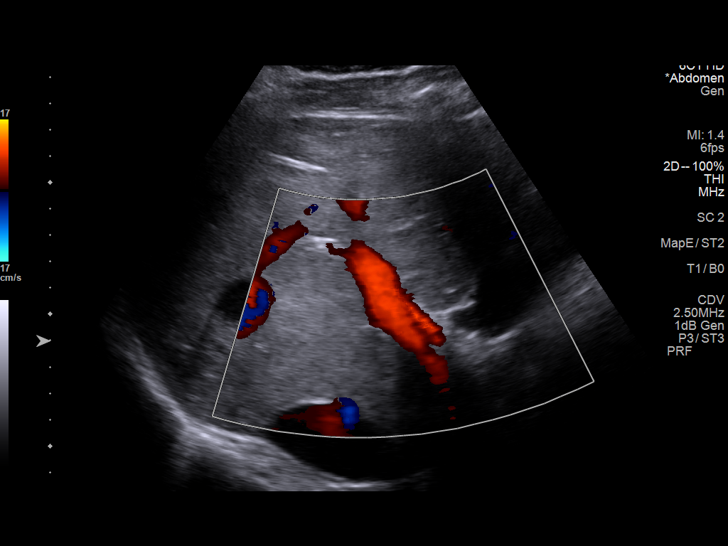
[im 37/98]
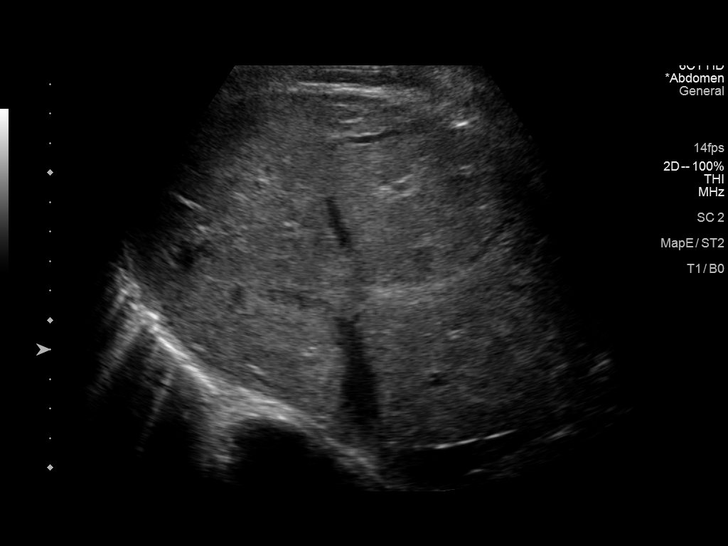
[im 45/98]
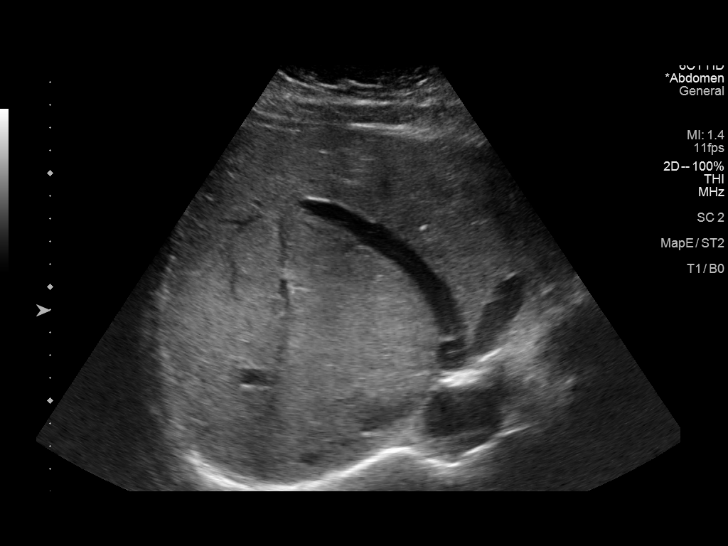
[im 53/98]
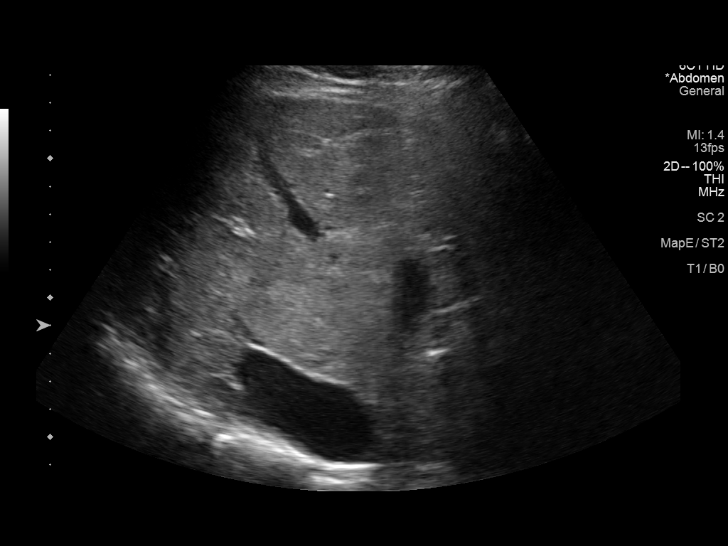
[im 61/98]
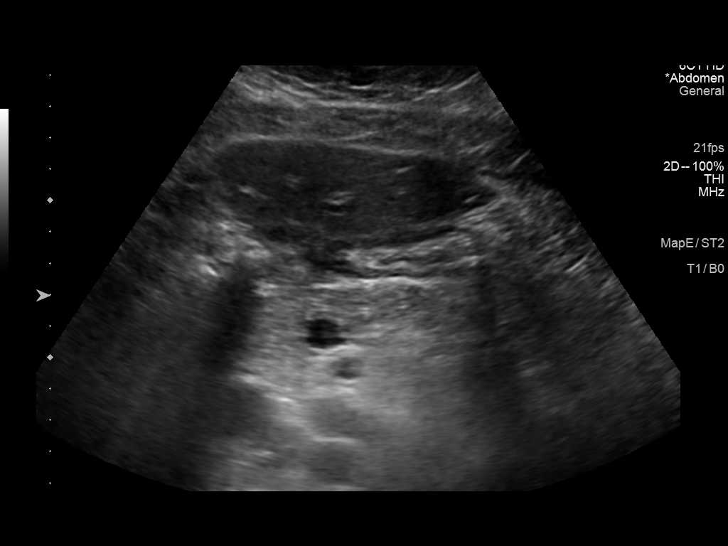
[im 65/98]
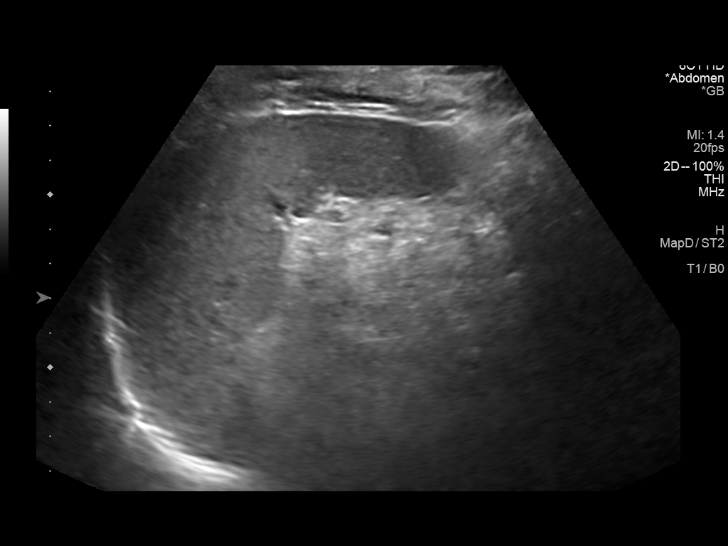
[im 73/98]
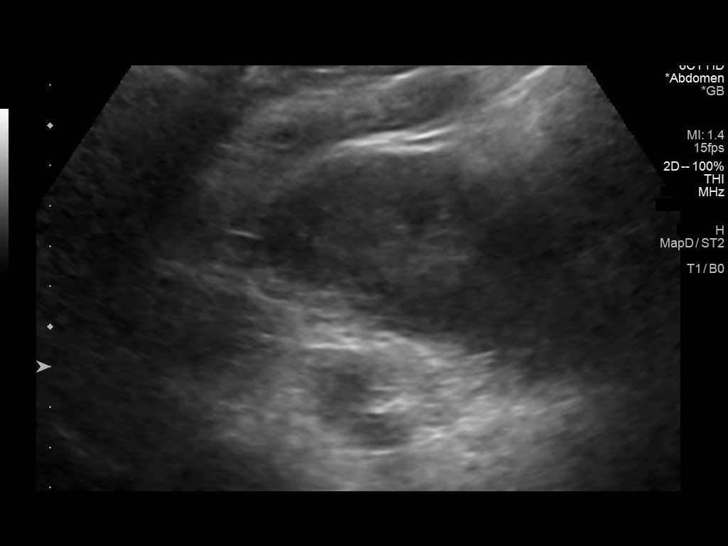
[im 81/98]
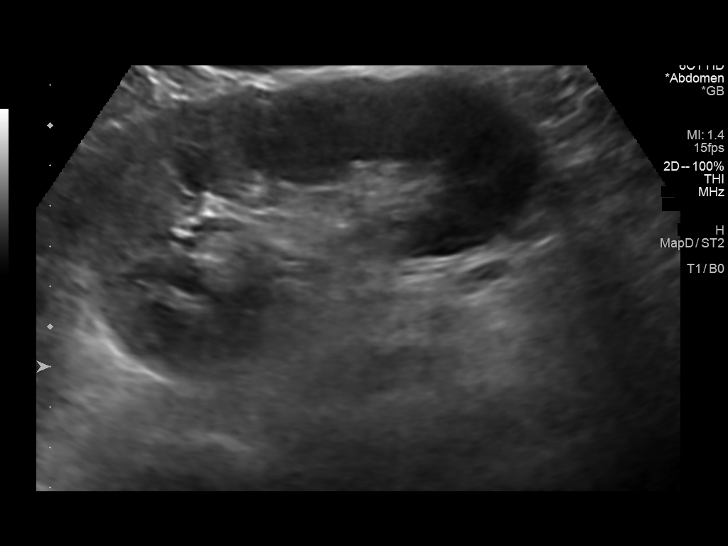
[im 89/98]
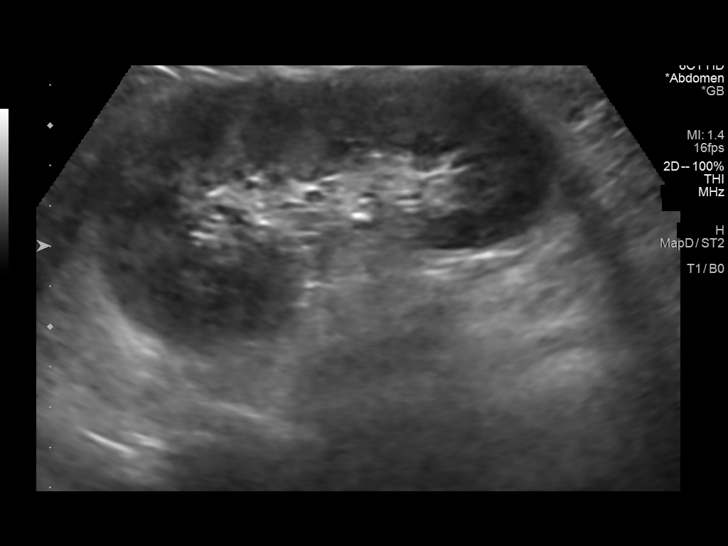
[im 98/98]
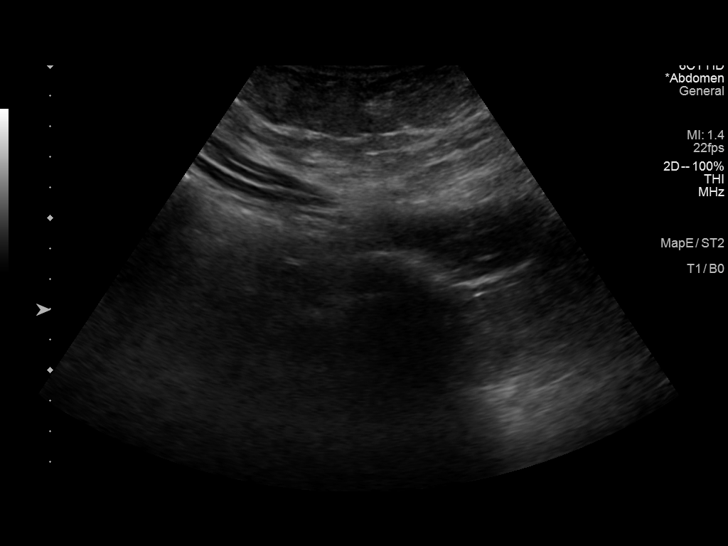

[14 of 25 positions shown; findings below may reference images not displayed]

FINDINGS: Gallbladder: No gallstones or wall thickening visualized. No
sonographic Murphy sign noted by sonographer.

Common bile duct: Diameter: 4 mm

Liver: No focal lesion identified. Within normal limits in
parenchymal echogenicity. Portal vein is patent on color Doppler
imaging with normal direction of blood flow towards the liver.

IVC: No abnormality visualized.

Pancreas: Visualized portion unremarkable.

Spleen: Size and appearance within normal limits.

Right Kidney: Length: 11.4 cm. Echogenicity within normal limits. No
mass or hydronephrosis visualized.

Left Kidney: Length: 11.7 cm. Echogenicity within normal limits. No
mass or hydronephrosis visualized.

Abdominal aorta: No aneurysm visualized.

Other findings: None.
IMPRESSION: Unremarkable abdominal ultrasound.

## 2021-06-17 NOTE — Progress Notes (Signed)
Advanced Heart Failure Clinic Note   PCP:  [name], MD  GI : Dr Marko Plume.  Primary HF: Dr. [name]  Chief Complaint: Heart Failure follow up   History of Present Illness:  Bradley Powell is a 40 y.o. male with a history of combined systolic/diastolic heart failure,  obesity, HTN, and gout.   Admitted 4/13 through 03/07/17 with marked volume overload. EF 40-45%. Diuresed with IV lasix tid + metolazone. Had RHC/LHC as noted below. Overall diuresed 45 pounds. Discharge weight was 281 pounds.    He underwent atrial flutter ablation on 04/06/18. Subsequently had marked 1st AV block with progression to junctional rhythm. Saw Dr. Ladona Ridgel who wanted to follow it. Suggested cutting back carvedilol.  Previously Bidil stopped due to hypotension.  Admitted  8/21 with intractable n/v of unclear etiology, hypotension and AKI. Creatinine peaked at 4.5.Back to 1.6 at d/c. Has been followed by GI.   cMRI 7/21 EF 45% suspect infiltrative CM. ECV 55%  Volume overloaded at clinic f/u 6/22, weight up 50 lbs. Misunderstood metolazone directions and took for 5 days. Weight down 65 lbs and required IVF in clinic.  Today she returns for HF follow up. Breathing is OK, but weight is up 10 lbs. Just got back from vacation and drank a lot of fluid, did not take torsemide every day while on vacation. Legs are swelling. Denies SOB, CP, dizziness, or PND/Orthopnea. Appetite ok. No fever or chills. Weight at home 268 pounds. Taking all medications. Had GI U/S Monday which came back normal.   Cardiac Studies  cMRI (7/21): 1. Findings suggestive of infiltrative cardiomyopathy. Native T1 ECV 55%, with diffuse post contrast delayed myocardial enhancement and abnormal T1 myocardial nulling kinetics, consistent with a diagnosis of cardiac amyloidosis. 2.  Upper limit of normal biventricular chamber size. 3. Mildly reduced biventricular systolic function. LVEF 45%, RVEF 44%. 4. Cannot exclude  possible anomalous pulmonary venous drainage in to the mid SVC. Seen on axial HASTE image series 3 image 4, but evaluation limited due to poor spatial resolution. Consider ECG gated CTA chest for further evaluation.  - Echo 2/21: EF 40-45%  - Echo 1/20: EF 35-40% - Echo 9/18: EF ~25-30%.  - Echo 3/19: EF 45-50%  - Holter 7/18: 6% PVCs - Sleep study (10/18): AHI 26/hr  - RHC (2/21): RA = 14 RV = 43/15 PA = 43/21 (31) PCW = 15 Fick cardiac output/index = 5.7/2.4 PVR = 2.8 WU Ao sat = 97% PA sat = 73%, 75% SVC sat = 67%   - R/LHC (4/18): AO = 109/86 (97) LV =  101/28 RA =  29 RV = 50/22 PA = 55/24 (38) PCW = 26 Fick cardiac output/index = 6.4/2.6 Thermal CO/CI = 4.9/2.0 PVR = 2.5 WU FA sat = 98% PA sat = 69%, 71% SVC sat = 82%, 82% IVC sat = 66%   Assessment: 1. Normal coronary arteries  2. Mild to moderate pulmonary HTN with normal PVR 3. Biventricular volume overload with R > L heart failure 4. Moderately decreased CO by thermodilution  5. 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 No intra-cardiac shunt  Past Medical History:  Diagnosis Date   Atrial flutter (HCC)    a. s/p ablation 03/2018.   Chronic combined systolic and diastolic CHF (congestive heart failure) (HCC)    History of esophagogastroduodenoscopy (EGD)    Morbid obesity (HCC)    NICM (  nonischemic cardiomyopathy) (HCC)    PVC's (premature ventricular contractions)    Sleep apnea    Past Surgical History:  Procedure Laterality Date   A-FLUTTER ABLATION N/A 04/06/2018   Procedure: A-FLUTTER ABLATION;  Surgeon: Marinus Maw, MD;  Location: [facility] INVASIVE CV LAB;  Service: Cardiovascular;  Laterality: N/A;   ESOPHAGEAL BRUSHING  06/19/2020   Procedure: ESOPHAGEAL BRUSHING;  Surgeon: Charlott Rakes, MD;  Location: [facility] ENDOSCOPY;  Service: Endoscopy;;   ESOPHAGOGASTRODUODENOSCOPY (EGD) WITH PROPOFOL Left 06/19/2020    Procedure: ESOPHAGOGASTRODUODENOSCOPY (EGD) WITH PROPOFOL;  Surgeon: Charlott Rakes, MD;  Location: [facility] ENDOSCOPY;  Service: Endoscopy;  Laterality: Left;   NO PAST SURGERIES     RIGHT HEART CATH N/A 12/20/2019   Procedure: RIGHT HEART CATH;  Surgeon: Dolores Patty, MD;  Location: [facility] INVASIVE CV LAB;  Service: Cardiovascular;  Laterality: N/A;   RIGHT/LEFT HEART CATH AND CORONARY ANGIOGRAPHY N/A 03/01/2017   Procedure: Right/Left Heart Cath and Coronary Angiography;  Surgeon: Dolores Patty, MD;  Location: [facility] INVASIVE CV LAB;  Service: Cardiovascular;  Laterality: N/A;    Current Outpatient Medications  Medication Sig Dispense Refill   allopurinol (ZYLOPRIM) 100 MG tablet Take 2 tablets (200 mg total) by mouth daily. Please contact PCP for further refills 60 tablet 0   carvedilol (COREG) 3.125 MG tablet TAKE 1 TABLET (3.125 MG TOTAL) BY MOUTH 2 (TWO) TIMES DAILY WITH A MEAL. 180 tablet 1   ELIQUIS 5 MG TABS tablet TAKE 1 TABLET BY MOUTH TWICE A DAY 60 tablet 6   JARDIANCE 10 MG TABS tablet TAKE 1 TABLET BY MOUTH EVERY DAY BEFORE BREAKFAST. 30 tablet 11   KLOR-CON M20 20 MEQ tablet TAKE 1 TABLET BY MOUTH 2 TIMES DAILY. TAKE AN EXTRA 2 TABS WHEN YOU TAKE METOLAZONE 225 tablet 1   MITIGARE 0.6 MG CAPS Take 0.6 mg by mouth daily as needed (gout flare). 30 capsule 5   ondansetron (ZOFRAN ODT) 4 MG disintegrating tablet Take 1 tablet (4 mg total) by mouth every 8 (eight) hours as needed for nausea or vomiting. 20 tablet 0   pantoprazole (PROTONIX) 40 MG tablet Take 40 mg by mouth daily.     promethazine (PROMETHEGAN) 12.5 MG suppository Place 1 suppository (12.5 mg total) rectally every 6 (six) hours as needed for nausea or vomiting. 12 each 0   spironolactone (ALDACTONE) 25 MG tablet TAKE 1 TABLET BY MOUTH EVERYDAY AT BEDTIME 90 tablet 3   sucralfate (CARAFATE) 1 g tablet Take 1 g by mouth daily.      torsemide (DEMADEX) 20 MG tablet Take 1 tablet (20 mg total) by mouth daily. 120 tablet 3    COVID-19 mRNA Vac-TriS, Pfizer, (PFIZER-BIONT COVID-19 VAC-TRIS) SUSP injection Inject into the muscle. (Patient not taking: Reported on 06/18/2021) 0.3 mL 0   No current facility-administered medications for this encounter.    Allergies:   Entresto [sacubitril-valsartan]   Social History:  The patient  reports that he has never smoked. He has never used smokeless tobacco. He reports current alcohol use. He reports that he does not use drugs.   Family History:  The patient's family history includes CVA in his mother; Gout in his father; Hypertension in his father; Multiple sclerosis in his mother; Seizures in his mother.   ROS:  Please see the history of present illness.   All other systems are personally reviewed and negative.   BP 96/60   Pulse 67   Wt 124 kg (273 lb 6.4 oz)   SpO2 97%  BMI 41.57 kg/m   Recent Labs: 05/12/2021: ALT 33; B Natriuretic Peptide 149.8; Hemoglobin 20.0; Platelets 333 05/21/2021: BUN 9; Creatinine, Ser 1.43; Potassium 3.8; Sodium 137  Personally reviewed   Wt Readings from Last 3 Encounters:  06/18/21 124 kg (273 lb 6.4 oz)  05/21/21 116.3 kg (256 lb 6.4 oz)  05/12/21 112.2 kg (247 lb 6.4 oz)    Exam:   General:  NAD. No resp difficulty HEENT: Normal Neck: Supple. JVP 7-8. Carotids 2+ bilat; no bruits. No lymphadenopathy or thryomegaly appreciated. Cor: PMI nondisplaced. Regular rate & rhythm. No rubs, gallops or murmurs. Lungs: Clear, diminished in bases. Abdomen: Obese, nontender, nondistended. No hepatosplenomegaly. No bruits or masses. Good bowel sounds. Extremities: No cyanosis, clubbing, rash, 1+ LE edema Neuro: Alert & oriented x 3, cranial nerves grossly intact. Moves all 4 extremities w/o difficulty. Affect pleasant.  REDs: 41% ECG (personally reviewed): Atrial fibrillation  ASSESSMENT AND PLAN:  1. Combined Systolic/Diastolic Heart Failure- ECHO 02/22/2017 EF 40-45% Grade II DD. Peak PA pressure 46 mmhg. Echo 9/18 EF ~20-25%. Echo  01/2018: EF 40-45%  - Echo 1/20  EF 35-40% with mild RV dysfunction RVSP 40 mmHG - Echo (12/16/19): EF 40-45% Moderate RV dysfunction  - cMRI 8/21 EF 45% + infiltrative process suggestive of possible amyloidosis - Hospitalization 8/21 with volume depletion and AKI. HF meds cut back. - NYHA III. Volume up today, ReDs 41% - Increase torsemide to 40 mg bid x 3 days, then back to 20 mg daily. - Increase KCl to 20 mEq bid x 3 days then back to 20 mEq daily. - Continue carvedilol 3.125 mg bid. (Previously cut back with conduction system disease and bradycardia). - Continue spiro 25 mg daily.  - Continue Jardiance 10 mg daily. - Off losartan due to AKI and vomiting. Likely can restart once we straighten out volume issues - Failed Bidil with hypotension.  - Has been following with Dr. Jomarie Longs for Genetics Counseling. Felt to have characteristics for a genetic CM but not to have pathogenic variant for HCM.  - Genetic testing 2021 "uncertain variant for HCM. Negative for TTR"  - cMRI raises concern for TTR amyloid.  - MM panel negative.  - PYP 4/22. Ratio 1.26 read as equivocal.  - Given MRI findings and equivocal PYP suspect next step will need to consider endomyocardial biopsy.  - BMET, BNP today; repeat BMET in 7-10 days.  2. Possible cirrhosis, possible cardiac in nature - Suspect related to chronic RV failure in setting of OSA/OHS. +/- fatty liver. - RHC (2/21) with only minimally elevated PA pressures (much lower than I would suspect given his degree of cirrhosis on MRI). - Following with [facility], core liver bx (11/21) & abd [place] (1/22) unremarkable. - N/V better recently. Has been seen at [facility] I wonder if potentially amyloid playing a role.   3. Sleep Apnea  - Continue CPAP.  4. Pulmonary HTN - RV strain on echo.  - RHC in 2/21 minimal PAH with low PAPi. - Needs to continue CPAP.   5. Morbid Obesity: - Body mass index is 41.57 kg/m.  - Consider [employer].  6. PVCs - ? If  cardiomyopathy in part PVC-related. 24-hour monitor (7/18)  PVCs 6%. - NSR with AV dissociation by EKG previously. None on ECG today. - EP as below.   7. Paroxysmal atrial fib/Atrial flutter  - S/p a flutter ablation 04/06/18 with Dr. Ladona Ridgel. - Previous EKG NSR with AV dissociation and junctional rhythm at 77 bpm.   -  Continue rate control strategy for now. - On Eliquis. No bleeding.  8. HTN - Controlled. - Hopefully we can add back low-dose losartan soon.  Follow up with Dr. [name] in 2-3 months  Signed, Jacklynn Ganong, FNP  06/18/2021 11:26 AM  Advanced [facility] Failure [facility] [address] [facility] [place] [address] [phone] (office) [phone] (fax)

## 2021-06-18 ENCOUNTER — Encounter (HOSPITAL_COMMUNITY): Payer: Self-pay

## 2021-06-18 ENCOUNTER — Ambulatory Visit (HOSPITAL_COMMUNITY)
Admission: RE | Admit: 2021-06-18 | Discharge: 2021-06-18 | Disposition: A | Discharge status: Home / Self Care | Payer: 59 | Source: Ambulatory Visit | Attending: Family Medicine | Admitting: Family Medicine

## 2021-06-18 ENCOUNTER — Other Ambulatory Visit (HOSPITAL_COMMUNITY): Payer: Self-pay

## 2021-06-18 ENCOUNTER — Other Ambulatory Visit: Payer: Self-pay

## 2021-06-18 VITALS — BP 96/60 | HR 67 | Wt 273.4 lb

## 2021-06-18 DIAGNOSIS — Z79899 Other long term (current) drug therapy: Secondary | ICD-10-CM | POA: Diagnosis not present

## 2021-06-18 DIAGNOSIS — Z7901 Long term (current) use of anticoagulants: Secondary | ICD-10-CM | POA: Diagnosis not present

## 2021-06-18 DIAGNOSIS — I10 Essential (primary) hypertension: Secondary | ICD-10-CM

## 2021-06-18 DIAGNOSIS — Z8249 Family history of ischemic heart disease and other diseases of the circulatory system: Secondary | ICD-10-CM | POA: Insufficient documentation

## 2021-06-18 DIAGNOSIS — I272 Pulmonary hypertension, unspecified: Secondary | ICD-10-CM | POA: Diagnosis not present

## 2021-06-18 DIAGNOSIS — I493 Ventricular premature depolarization: Secondary | ICD-10-CM | POA: Insufficient documentation

## 2021-06-18 DIAGNOSIS — I5042 Chronic combined systolic (congestive) and diastolic (congestive) heart failure: Secondary | ICD-10-CM | POA: Diagnosis not present

## 2021-06-18 DIAGNOSIS — K760 Fatty (change of) liver, not elsewhere classified: Secondary | ICD-10-CM | POA: Insufficient documentation

## 2021-06-18 DIAGNOSIS — N179 Acute kidney failure, unspecified: Secondary | ICD-10-CM | POA: Insufficient documentation

## 2021-06-18 DIAGNOSIS — K761 Chronic passive congestion of liver: Secondary | ICD-10-CM | POA: Diagnosis not present

## 2021-06-18 DIAGNOSIS — Z7984 Long term (current) use of oral hypoglycemic drugs: Secondary | ICD-10-CM | POA: Diagnosis not present

## 2021-06-18 DIAGNOSIS — G4733 Obstructive sleep apnea (adult) (pediatric): Secondary | ICD-10-CM | POA: Diagnosis not present

## 2021-06-18 DIAGNOSIS — I110 Hypertensive heart disease with heart failure: Secondary | ICD-10-CM | POA: Insufficient documentation

## 2021-06-18 DIAGNOSIS — I480 Paroxysmal atrial fibrillation: Secondary | ICD-10-CM | POA: Diagnosis not present

## 2021-06-18 DIAGNOSIS — K7460 Unspecified cirrhosis of liver: Secondary | ICD-10-CM | POA: Insufficient documentation

## 2021-06-18 DIAGNOSIS — Z6841 Body Mass Index (BMI) 40.0 and over, adult: Secondary | ICD-10-CM | POA: Insufficient documentation

## 2021-06-18 DIAGNOSIS — M109 Gout, unspecified: Secondary | ICD-10-CM | POA: Insufficient documentation

## 2021-06-18 LAB — BASIC METABOLIC PANEL WITH GFR
Anion gap: 8 (ref 5–15)
BUN: 14 mg/dL (ref 6–20)
CO2: 27 mmol/L (ref 22–32)
Calcium: 9.1 mg/dL (ref 8.9–10.3)
Chloride: 102 mmol/L (ref 98–111)
Creatinine, Ser: 1.08 mg/dL (ref 0.61–1.24)
GFR, Estimated: >60 mL/min
Glucose, Bld: 102 mg/dL — ABNORMAL HIGH (ref 70–99)
Potassium: 4 mmol/L (ref 3.5–5.1)
Sodium: 137 mmol/L (ref 135–145)

## 2021-06-18 LAB — BRAIN NATRIURETIC PEPTIDE: B Natriuretic Peptide: 315.5 pg/mL — ABNORMAL HIGH (ref 0.0–100.0)

## 2021-06-18 NOTE — Patient Instructions (Signed)
Increase Torsemide to 40 mg (2 tabs) Twice daily FOR 3 DAYS ONLY, then back to 20 mg (1 tab) Daily  Increase Potassium to 20 meq (1 tab) Twice daily FOR 3 DAYS ONLY, then back to 20 meq (1 tab) Daily  Labs done today, your results will be available in MyChart, we will contact you for abnormal readings.  Your physician recommends that you return for lab work in: 1 week  Your physician recommends that you schedule a follow-up appointment in: 2 weeks and again in 2 months  Do the following things EVERYDAY: Weigh yourself in the morning before breakfast. Write it down and keep it in a log. Take your medicines as prescribed Eat low salt foods--Limit salt (sodium) to 2000 mg per day.  Stay as active as you can everyday Limit all fluids for the day to less than 2 liters  If you have any questions or concerns before your next appointment please send [place] a message through [place] or call our office at [phone].    TO LEAVE A MESSAGE FOR THE NURSE SELECT OPTION 2, PLEASE LEAVE A MESSAGE INCLUDING: YOUR NAME DATE OF BIRTH CALL BACK NUMBER REASON FOR CALL**this is important as we prioritize the call backs  YOU WILL RECEIVE A CALL BACK THE SAME DAY AS LONG AS YOU CALL BEFORE 4:00 PM  milAt the [facility], you and your health needs are our priority. As part of our continuing mission to provide you with exceptional heart care, we have created designated Provider Care Teams. These Care Teams include your primary Cardiologist (physician) and Advanced Practice Providers (APPs- Physician Assistants and Nurse Practitioners) who all work together to provide you with the care you need, when you need it.   You may see any of the following providers on your designated Care Team at your next follow up: Dr Arvilla Meres Dr Marca Ancona Dr Maanav Melnick, NP Robbie Lis, [place] Mikki Santee Karle Plumber, PharmD   Please be sure to bring in all your medications  bottles to every appointment.

## 2021-06-18 NOTE — Progress Notes (Signed)
ReDS Vest / Clip - 06/18/21 1200       ReDS Vest / Clip   Station Marker D    Ruler Value 26    ReDS Value Range High volume overload    ReDS Actual Value 41

## 2021-06-29 NOTE — Progress Notes (Signed)
Advanced [facility] Failure Clinic Note   PCP:  [name], MD  GI : Dr Marko Plume.  Primary HF: Dr. [name]  Chief Complaint: Heart Failure follow up   History of Present Illness:  Bradley Powell is a 40 y.o. male with a history of combined systolic/diastolic heart failure,  obesity, HTN, and gout.   Admitted 4/13 through 03/07/17 with marked volume overload. EF 40-45%. Diuresed with IV lasix tid + metolazone. Had RHC/LHC as noted below. Overall diuresed 45 pounds. Discharge weight was 281 pounds.    He underwent atrial flutter ablation on 04/06/18. Subsequently had marked 1st AV block with progression to junctional rhythm. Saw Dr. Ladona Ridgel who wanted to follow it. Suggested cutting back carvedilol.  Previously Bidil stopped due to hypotension.  Admitted  8/21 with intractable N/V of unclear etiology, hypotension and AKI. Creatinine peaked at 4.5. Back to 1.6 at d/c. Has been followed by GI.   cMRI 7/21 EF 45% suspect infiltrative CM. ECV 55%  Volume overloaded at clinic f/u 6/22, weight up 50 lbs. Misunderstood metolazone directions and took for 5 days. Weight down 65 lbs and required IVF in clinic.  Today he returns for HF follow up. Weight up 10 lbs last appt after returning from vacation and we increased diuretics. Still has some LE swelling, but better. Going to gym and swimming. No dyspnea with activity. Denies CP, dizziness,  or PND/Orthopnea. Appetite ok. Weight at home 263 pounds. Taking all medications. No bleeding issues.  Cardiac Studies  cMRI (7/21): 1. Findings suggestive of infiltrative cardiomyopathy. Native T1 ECV 55%, with diffuse post contrast delayed myocardial enhancement and abnormal T1 myocardial nulling kinetics, consistent with a diagnosis of cardiac amyloidosis. 2.  Upper limit of normal biventricular chamber size. 3. Mildly reduced biventricular systolic function. LVEF 45%, RVEF 44%. 4. Cannot exclude possible anomalous pulmonary venous  drainage in to the mid SVC. Seen on axial HASTE image series 3 image 4, but evaluation limited due to poor spatial resolution. Consider ECG gated CTA chest for further evaluation.  - Echo 2/21: EF 40-45%  - Echo 1/20: EF 35-40% - Echo 9/18: EF ~25-30%.  - Echo 3/19: EF 45-50%  - Holter 7/18: 6% PVCs - Sleep study (10/18): AHI 26/hr  - RHC (2/21): RA = 14 RV = 43/15 PA = 43/21 (31) PCW = 15 Fick cardiac output/index = 5.7/2.4 PVR = 2.8 WU Ao sat = 97% PA sat = 73%, 75% SVC sat = 67%   - R/LHC (4/18): AO = 109/86 (97) LV =  101/28 RA =  29 RV = 50/22 PA = 55/24 (38) PCW = 26 Fick cardiac output/index = 6.4/2.6 Thermal CO/CI = 4.9/2.0 PVR = 2.5 WU FA sat = 98% PA sat = 69%, 71% SVC sat = 82%, 82% IVC sat = 66%   Assessment: 1. Normal coronary arteries  2. Mild to moderate pulmonary HTN with normal PVR 3. Biventricular volume overload with R > L heart failure 4. Moderately decreased CO by thermodilution  5. 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 No intra-cardiac shunt  Past Medical History:  Diagnosis Date   Atrial flutter (HCC)    a. s/p ablation 03/2018.   Chronic combined systolic and diastolic CHF (congestive heart failure) (HCC)    History of esophagogastroduodenoscopy (EGD)    Morbid obesity (HCC)    NICM (nonischemic cardiomyopathy) (HCC)    PVC's (premature ventricular contractions)  Sleep apnea    Past Surgical History:  Procedure Laterality Date   A-FLUTTER ABLATION N/A 04/06/2018   Procedure: A-FLUTTER ABLATION;  Surgeon: Marinus Maw, MD;  Location: [facility] INVASIVE CV LAB;  Service: Cardiovascular;  Laterality: N/A;   ESOPHAGEAL BRUSHING  06/19/2020   Procedure: ESOPHAGEAL BRUSHING;  Surgeon: Charlott Rakes, MD;  Location: [facility] ENDOSCOPY;  Service: Endoscopy;;   ESOPHAGOGASTRODUODENOSCOPY (EGD) WITH PROPOFOL Left 06/19/2020   Procedure: ESOPHAGOGASTRODUODENOSCOPY  (EGD) WITH PROPOFOL;  Surgeon: Charlott Rakes, MD;  Location: [facility] ENDOSCOPY;  Service: Endoscopy;  Laterality: Left;   NO PAST SURGERIES     RIGHT HEART CATH N/A 12/20/2019   Procedure: RIGHT HEART CATH;  Surgeon: Dolores Patty, MD;  Location: [facility] INVASIVE CV LAB;  Service: Cardiovascular;  Laterality: N/A;   RIGHT/LEFT HEART CATH AND CORONARY ANGIOGRAPHY N/A 03/01/2017   Procedure: Right/Left Heart Cath and Coronary Angiography;  Surgeon: Dolores Patty, MD;  Location: [facility] INVASIVE CV LAB;  Service: Cardiovascular;  Laterality: N/A;    Current Outpatient Medications  Medication Sig Dispense Refill   allopurinol (ZYLOPRIM) 100 MG tablet Take 2 tablets (200 mg total) by mouth daily. Please contact PCP for further refills 60 tablet 0   carvedilol (COREG) 3.125 MG tablet TAKE 1 TABLET (3.125 MG TOTAL) BY MOUTH 2 (TWO) TIMES DAILY WITH A MEAL. 180 tablet 1   ELIQUIS 5 MG TABS tablet TAKE 1 TABLET BY MOUTH TWICE A DAY 60 tablet 6   JARDIANCE 10 MG TABS tablet TAKE 1 TABLET BY MOUTH EVERY DAY BEFORE BREAKFAST. 30 tablet 11   KLOR-CON M20 20 MEQ tablet TAKE 1 TABLET BY MOUTH 2 TIMES DAILY. TAKE AN EXTRA 2 TABS WHEN YOU TAKE METOLAZONE 225 tablet 1   MITIGARE 0.6 MG CAPS Take 0.6 mg by mouth daily as needed (gout flare). 30 capsule 5   ondansetron (ZOFRAN ODT) 4 MG disintegrating tablet Take 1 tablet (4 mg total) by mouth every 8 (eight) hours as needed for nausea or vomiting. 20 tablet 0   pantoprazole (PROTONIX) 40 MG tablet Take 40 mg by mouth daily.     promethazine (PROMETHEGAN) 12.5 MG suppository Place 1 suppository (12.5 mg total) rectally every 6 (six) hours as needed for nausea or vomiting. 12 each 0   spironolactone (ALDACTONE) 25 MG tablet TAKE 1 TABLET BY MOUTH EVERYDAY AT BEDTIME 90 tablet 3   sucralfate (CARAFATE) 1 g tablet Take 1 g by mouth daily.      torsemide (DEMADEX) 20 MG tablet Take 1 tablet (20 mg total) by mouth daily. 120 tablet 3   No current facility-administered  medications for this encounter.    Allergies:   Entresto [sacubitril-valsartan]   Social History:  The patient  reports that he has never smoked. He has never used smokeless tobacco. He reports current alcohol use. He reports that he does not use drugs.   Family History:  The patient's family history includes CVA in his mother; Gout in his father; Hypertension in his father; Multiple sclerosis in his mother; Seizures in his mother.   ROS:  Please see the history of present illness.   All other systems are personally reviewed and negative.   BP 100/68   Pulse 68   Wt 122.8 kg   SpO2 97%   BMI 41.17 kg/m   Recent Labs: 05/12/2021: ALT 33; Hemoglobin 20.0; Platelets 333 06/18/2021: B Natriuretic Peptide 315.5; BUN 14; Creatinine, Ser 1.08; Potassium 4.0; Sodium 137  Personally reviewed   Wt Readings from Last 3 Encounters:  06/30/21 122.8 kg  06/18/21 124 kg  05/21/21 116.3 kg    Exam:   General:  NAD. No resp difficulty HEENT: Normal Neck: Supple. JVP 6-7. Carotids 2+ bilat; no bruits. No lymphadenopathy or thryomegaly appreciated. Cor: PMI nondisplaced. Irregular rate & rhythm. No rubs, gallops or murmurs. Lungs: Clear Abdomen: Obese, nontender, nondistended. No hepatosplenomegaly. No bruits or masses. Good bowel sounds. Extremities: No cyanosis, clubbing, rash, 1+ LE edema, R>L Neuro: Alert & oriented x 3, cranial nerves grossly intact. Moves all 4 extremities w/o difficulty. Affect pleasant.  REDs: 39%  ASSESSMENT AND PLAN:  1. Combined Systolic/Diastolic Heart Failure- ECHO 02/22/2017 EF 40-45% Grade II DD. Peak PA pressure 46 mmhg. Echo 9/18 EF ~20-25%. Echo 01/2018: EF 40-45%  - Echo 1/20  EF 35-40% with mild RV dysfunction RVSP 40 mmHG - Echo (12/16/19): EF 40-45% Moderate RV dysfunction  - cMRI 8/21 EF 45% + infiltrative process suggestive of possible amyloidosis - Hospitalization 8/21 with volume depletion and AKI. HF meds cut back. - Better NYHA II. Volume mildly up  today, ReDs 39% today from 41%. - Increase torsemide to 40 mg bid x 3 days then 40 mg daily (previously on 20 mg daily). - Continue carvedilol 3.125 mg bid. (Previously cut back with conduction system disease and bradycardia). - Continue spiro 25 mg daily.  - Continue Jardiance 10 mg daily. - Off losartan due to AKI and vomiting. BP soft today, will hold off adding back today. - Failed Bidil with hypotension.  - Has been following with Dr. Jomarie Longs for Genetics Counseling. Felt to have characteristics for a genetic CM but not to have pathogenic variant for HCM.  - Genetic testing 2021 "uncertain variant for HCM. Negative for TTR"  - cMRI raises concern for TTR amyloid.  - MM panel negative.  - PYP 4/22. Ratio 1.26 read as equivocal.  - Given MRI findings and equivocal PYP suspect next step will need to consider endomyocardial biopsy.  - BMET today.  2. Possible cirrhosis, possible cardiac in nature - Suspect related to chronic RV failure in setting of OSA/OHS. +/- fatty liver. - RHC (2/21) with only minimally elevated PA pressures (much lower than I would suspect given his degree of cirrhosis on MRI). - Following with [facility], core liver bx (11/21) & abd [place] (1/22) unremarkable. - N/V better recently. Has been seen at [facility] I wonder if potentially amyloid playing a role.   3. Sleep Apnea  - Continue CPAP.  4. Pulmonary HTN - RV strain on echo.  - RHC in 2/21 minimal PAH with low PAPi. - Needs to continue CPAP.   5. Morbid Obesity: - Body mass index is 41.17 kg/m.  - Working out at [employer].  6. PVCs - ? If cardiomyopathy in part PVC-related. 24-hour monitor (7/18)  PVCs 6%. - NSR with AV dissociation by EKG previously.  - Denies palpitations. - EP as below.   7. Paroxysmal atrial fib/Atrial flutter  - S/p a flutter ablation 04/06/18 with Dr. Ladona Ridgel. - Previous EKG NSR with AV dissociation and junctional rhythm at 77 bpm.   - Has seen Dr. Ladona Ridgel, unlikely to maintain SR  with DCCV, could consider Tikosyn. - Continue rate control strategy for now.  - On Eliquis. No bleeding.  8. HTN - Controlled. - Hopefully we can add back low-dose losartan soon.  Follow up with Dr. [name] next month as scheduled.  Cydney Ok, FNP  06/30/2021 3:55 PM  [facility] [place] [address]  [address] and [place] [address] [phone] (office) [phone] (fax)

## 2021-06-30 ENCOUNTER — Other Ambulatory Visit: Payer: Self-pay

## 2021-06-30 ENCOUNTER — Ambulatory Visit (HOSPITAL_COMMUNITY)
Admission: RE | Admit: 2021-06-30 | Discharge: 2021-06-30 | Disposition: A | Discharge status: Home / Self Care | Payer: 59 | Source: Ambulatory Visit | Attending: Family Medicine | Admitting: Family Medicine

## 2021-06-30 ENCOUNTER — Encounter (HOSPITAL_COMMUNITY): Payer: Self-pay

## 2021-06-30 VITALS — BP 100/68 | HR 68 | Wt 270.8 lb

## 2021-06-30 DIAGNOSIS — Z8249 Family history of ischemic heart disease and other diseases of the circulatory system: Secondary | ICD-10-CM | POA: Diagnosis not present

## 2021-06-30 DIAGNOSIS — Z7901 Long term (current) use of anticoagulants: Secondary | ICD-10-CM | POA: Diagnosis not present

## 2021-06-30 DIAGNOSIS — I272 Pulmonary hypertension, unspecified: Secondary | ICD-10-CM

## 2021-06-30 DIAGNOSIS — I480 Paroxysmal atrial fibrillation: Secondary | ICD-10-CM

## 2021-06-30 DIAGNOSIS — K760 Fatty (change of) liver, not elsewhere classified: Secondary | ICD-10-CM | POA: Diagnosis not present

## 2021-06-30 DIAGNOSIS — I493 Ventricular premature depolarization: Secondary | ICD-10-CM

## 2021-06-30 DIAGNOSIS — Z79899 Other long term (current) drug therapy: Secondary | ICD-10-CM | POA: Insufficient documentation

## 2021-06-30 DIAGNOSIS — G4730 Sleep apnea, unspecified: Secondary | ICD-10-CM | POA: Diagnosis not present

## 2021-06-30 DIAGNOSIS — M109 Gout, unspecified: Secondary | ICD-10-CM | POA: Insufficient documentation

## 2021-06-30 DIAGNOSIS — G4733 Obstructive sleep apnea (adult) (pediatric): Secondary | ICD-10-CM

## 2021-06-30 DIAGNOSIS — Z7984 Long term (current) use of oral hypoglycemic drugs: Secondary | ICD-10-CM | POA: Insufficient documentation

## 2021-06-30 DIAGNOSIS — Z6841 Body Mass Index (BMI) 40.0 and over, adult: Secondary | ICD-10-CM | POA: Insufficient documentation

## 2021-06-30 DIAGNOSIS — I5042 Chronic combined systolic (congestive) and diastolic (congestive) heart failure: Secondary | ICD-10-CM | POA: Diagnosis not present

## 2021-06-30 DIAGNOSIS — I4892 Unspecified atrial flutter: Secondary | ICD-10-CM | POA: Insufficient documentation

## 2021-06-30 DIAGNOSIS — K761 Chronic passive congestion of liver: Secondary | ICD-10-CM

## 2021-06-30 DIAGNOSIS — I110 Hypertensive heart disease with heart failure: Secondary | ICD-10-CM | POA: Diagnosis not present

## 2021-06-30 DIAGNOSIS — I10 Essential (primary) hypertension: Secondary | ICD-10-CM

## 2021-06-30 LAB — BASIC METABOLIC PANEL
Anion gap: 15 (ref 5–15)
BUN: 13 mg/dL (ref 6–20)
CO2: 26 mmol/L (ref 22–32)
Calcium: 9.4 mg/dL (ref 8.9–10.3)
Chloride: 96 mmol/L — ABNORMAL LOW (ref 98–111)
Creatinine, Ser: 1.3 mg/dL — ABNORMAL HIGH (ref 0.61–1.24)
GFR, Estimated: >60 mL/min (ref >60)
Glucose, Bld: 165 mg/dL — ABNORMAL HIGH (ref 70–99)
Potassium: 3.5 mmol/L (ref 3.5–5.1)
Sodium: 137 mmol/L (ref 135–145)

## 2021-06-30 MED ORDER — CARVEDILOL 3.125 MG PO TABS: 3.1250 mg | ORAL_TABLET | Freq: Two times a day (BID) | ORAL | 1 refills | Status: DC

## 2021-06-30 MED ORDER — TORSEMIDE 20 MG PO TABS: ORAL_TABLET | ORAL | 0 refills | Status: DC

## 2021-06-30 NOTE — Progress Notes (Signed)
ReDS Vest / Clip - 06/30/21 1600       ReDS Vest / Clip   Station Marker D    Ruler Value 34    ReDS Value Range Moderate volume overload    ReDS Actual Value 39

## 2021-06-30 NOTE — Patient Instructions (Signed)
INCREASE Torsemide to 40 mg twice a day for 3 days, then 40 mg daily thereafter  Labs today We will only contact you if something comes back abnormal or we need to make some changes. Otherwise no news is good news!  Keep follow up as scheduled  Do the following things EVERYDAY: Weigh yourself in the morning before breakfast. Write it down and keep it in a log. Take your medicines as prescribed Eat low salt foods--Limit salt (sodium) to 2000 mg per day.  Stay as active as you can everyday Limit all fluids for the day to less than 2 liters  milAt the [facility], you and your health needs are our priority. As part of our continuing mission to provide you with exceptional heart care, we have created designated Provider Care Teams. These Care Teams include your primary Cardiologist (physician) and Advanced Practice Providers (APPs- Physician Assistants and Nurse Practitioners) who all work together to provide you with the care you need, when you need it.   You may see any of the following providers on your designated Care Team at your next follow up: Dr Arvilla Meres Dr Marca Ancona Dr Kelon Melnick, NP Robbie Lis, [place] Mikki Santee Karle Plumber, PharmD   Please be sure to bring in all your medications bottles to every appointment.

## 2021-07-01 ENCOUNTER — Other Ambulatory Visit (HOSPITAL_COMMUNITY): Payer: Self-pay | Admitting: Cardiology

## 2021-07-01 DIAGNOSIS — I5042 Chronic combined systolic (congestive) and diastolic (congestive) heart failure: Secondary | ICD-10-CM

## 2021-07-07 ENCOUNTER — Other Ambulatory Visit (HOSPITAL_COMMUNITY): Payer: 59

## 2021-07-08 ENCOUNTER — Ambulatory Visit (HOSPITAL_COMMUNITY)
Admission: RE | Admit: 2021-07-08 | Discharge: 2021-07-08 | Disposition: A | Discharge status: Home / Self Care | Payer: 59 | Source: Ambulatory Visit | Attending: Cardiology | Admitting: Cardiology

## 2021-07-08 ENCOUNTER — Other Ambulatory Visit: Payer: Self-pay

## 2021-07-08 DIAGNOSIS — I5042 Chronic combined systolic (congestive) and diastolic (congestive) heart failure: Secondary | ICD-10-CM | POA: Insufficient documentation

## 2021-07-08 LAB — BASIC METABOLIC PANEL
Anion gap: 11 (ref 5–15)
BUN: 13 mg/dL (ref 6–20)
CO2: 28 mmol/L (ref 22–32)
Calcium: 8.9 mg/dL (ref 8.9–10.3)
Chloride: 98 mmol/L (ref 98–111)
Creatinine, Ser: 1.24 mg/dL (ref 0.61–1.24)
GFR, Estimated: >60 mL/min (ref >60)
Glucose, Bld: 83 mg/dL (ref 70–99)
Potassium: 3.4 mmol/L — ABNORMAL LOW (ref 3.5–5.1)
Sodium: 137 mmol/L (ref 135–145)

## 2021-07-09 ENCOUNTER — Telehealth (HOSPITAL_COMMUNITY): Payer: Self-pay | Admitting: Cardiology

## 2021-07-09 DIAGNOSIS — I5042 Chronic combined systolic (congestive) and diastolic (congestive) heart failure: Secondary | ICD-10-CM

## 2021-07-09 DIAGNOSIS — K761 Chronic passive congestion of liver: Secondary | ICD-10-CM

## 2021-07-09 MED ORDER — POTASSIUM CHLORIDE CRYS ER 20 MEQ PO TBCR: EXTENDED_RELEASE_TABLET | ORAL | 3 refills | Status: DC

## 2021-07-09 NOTE — Telephone Encounter (Signed)
Patient called.  Patient aware. KCL increase to 40/20 repeat labs 9/9

## 2021-07-09 NOTE — Telephone Encounter (Signed)
-----   Message from Jacklynn Ganong, [place] sent at 07/09/2021  8:14 AM EDT ----- K is low. Are you taking 20 mEq of KCl twice a day? If so, please add additional 20 mEq tablet daily. Will need repeat BMET in 2 weeks.

## 2021-07-21 ENCOUNTER — Other Ambulatory Visit (HOSPITAL_COMMUNITY): Payer: Self-pay | Admitting: Internal Medicine

## 2021-07-23 ENCOUNTER — Ambulatory Visit (HOSPITAL_COMMUNITY)
Admission: RE | Admit: 2021-07-23 | Discharge: 2021-07-23 | Disposition: A | Discharge status: Home / Self Care | Payer: 59 | Source: Ambulatory Visit | Attending: Internal Medicine | Admitting: Internal Medicine

## 2021-07-23 ENCOUNTER — Other Ambulatory Visit: Payer: Self-pay

## 2021-07-23 DIAGNOSIS — I5042 Chronic combined systolic (congestive) and diastolic (congestive) heart failure: Secondary | ICD-10-CM | POA: Insufficient documentation

## 2021-07-23 LAB — BASIC METABOLIC PANEL
Anion gap: 10 (ref 5–15)
BUN: 16 mg/dL (ref 6–20)
CO2: 23 mmol/L (ref 22–32)
Calcium: 9 mg/dL (ref 8.9–10.3)
Chloride: 103 mmol/L (ref 98–111)
Creatinine, Ser: 1.41 mg/dL — ABNORMAL HIGH (ref 0.61–1.24)
GFR, Estimated: >60 mL/min (ref >60)
Glucose, Bld: 111 mg/dL — ABNORMAL HIGH (ref 70–99)
Potassium: 4.2 mmol/L (ref 3.5–5.1)
Sodium: 136 mmol/L (ref 135–145)

## 2021-07-31 ENCOUNTER — Other Ambulatory Visit (HOSPITAL_COMMUNITY): Payer: Self-pay | Admitting: Internal Medicine

## 2021-08-11 ENCOUNTER — Encounter (HOSPITAL_COMMUNITY): Payer: Self-pay | Admitting: Internal Medicine

## 2021-08-11 ENCOUNTER — Other Ambulatory Visit: Payer: Self-pay

## 2021-08-11 ENCOUNTER — Ambulatory Visit (HOSPITAL_COMMUNITY)
Admission: RE | Admit: 2021-08-11 | Discharge: 2021-08-11 | Disposition: A | Discharge status: Home / Self Care | Payer: 59 | Source: Ambulatory Visit | Attending: Internal Medicine | Admitting: Internal Medicine

## 2021-08-11 DIAGNOSIS — M109 Gout, unspecified: Secondary | ICD-10-CM | POA: Insufficient documentation

## 2021-08-11 DIAGNOSIS — Z79899 Other long term (current) drug therapy: Secondary | ICD-10-CM | POA: Diagnosis not present

## 2021-08-11 DIAGNOSIS — Z7984 Long term (current) use of oral hypoglycemic drugs: Secondary | ICD-10-CM | POA: Diagnosis not present

## 2021-08-11 DIAGNOSIS — I110 Hypertensive heart disease with heart failure: Secondary | ICD-10-CM | POA: Insufficient documentation

## 2021-08-11 DIAGNOSIS — Z8249 Family history of ischemic heart disease and other diseases of the circulatory system: Secondary | ICD-10-CM | POA: Insufficient documentation

## 2021-08-11 DIAGNOSIS — K760 Fatty (change of) liver, not elsewhere classified: Secondary | ICD-10-CM | POA: Diagnosis not present

## 2021-08-11 DIAGNOSIS — G4733 Obstructive sleep apnea (adult) (pediatric): Secondary | ICD-10-CM | POA: Diagnosis not present

## 2021-08-11 DIAGNOSIS — Z7901 Long term (current) use of anticoagulants: Secondary | ICD-10-CM | POA: Diagnosis not present

## 2021-08-11 DIAGNOSIS — R112 Nausea with vomiting, unspecified: Secondary | ICD-10-CM | POA: Insufficient documentation

## 2021-08-11 DIAGNOSIS — K761 Chronic passive congestion of liver: Secondary | ICD-10-CM | POA: Insufficient documentation

## 2021-08-11 DIAGNOSIS — I4891 Unspecified atrial fibrillation: Secondary | ICD-10-CM | POA: Diagnosis not present

## 2021-08-11 DIAGNOSIS — I4892 Unspecified atrial flutter: Secondary | ICD-10-CM | POA: Diagnosis not present

## 2021-08-11 DIAGNOSIS — I480 Paroxysmal atrial fibrillation: Secondary | ICD-10-CM | POA: Insufficient documentation

## 2021-08-11 DIAGNOSIS — I272 Pulmonary hypertension, unspecified: Secondary | ICD-10-CM | POA: Diagnosis not present

## 2021-08-11 DIAGNOSIS — I428 Other cardiomyopathies: Secondary | ICD-10-CM | POA: Diagnosis not present

## 2021-08-11 DIAGNOSIS — I5042 Chronic combined systolic (congestive) and diastolic (congestive) heart failure: Secondary | ICD-10-CM | POA: Insufficient documentation

## 2021-08-11 DIAGNOSIS — Z6841 Body Mass Index (BMI) 40.0 and over, adult: Secondary | ICD-10-CM | POA: Diagnosis not present

## 2021-08-11 DIAGNOSIS — G4730 Sleep apnea, unspecified: Secondary | ICD-10-CM | POA: Insufficient documentation

## 2021-08-11 DIAGNOSIS — I493 Ventricular premature depolarization: Secondary | ICD-10-CM | POA: Diagnosis not present

## 2021-08-11 LAB — CBC
HCT: 48.5 % (ref 39.0–52.0)
Hemoglobin: 15.3 g/dL (ref 13.0–17.0)
MCH: 28.7 pg (ref 26.0–34.0)
MCHC: 31.5 g/dL (ref 30.0–36.0)
MCV: 90.8 fL (ref 80.0–100.0)
Platelets: 225 10*3/uL (ref 150–400)
RBC: 5.34 MIL/uL (ref 4.22–5.81)
RDW: 13.4 % (ref 11.5–15.5)
WBC: 6.8 10*3/uL (ref 4.0–10.5)
nRBC: 0 % (ref 0.0–0.2)

## 2021-08-11 LAB — BASIC METABOLIC PANEL
Anion gap: 11 (ref 5–15)
BUN: 16 mg/dL (ref 6–20)
CO2: 25 mmol/L (ref 22–32)
Calcium: 8.6 mg/dL — ABNORMAL LOW (ref 8.9–10.3)
Chloride: 100 mmol/L (ref 98–111)
Creatinine, Ser: 1.36 mg/dL — ABNORMAL HIGH (ref 0.61–1.24)
GFR, Estimated: >60 mL/min (ref >60)
Glucose, Bld: 70 mg/dL (ref 70–99)
Potassium: 3.6 mmol/L (ref 3.5–5.1)
Sodium: 136 mmol/L (ref 135–145)

## 2021-08-11 LAB — CORTISOL: Cortisol, Plasma: 7.5 ug/dL

## 2021-08-11 LAB — BRAIN NATRIURETIC PEPTIDE: B Natriuretic Peptide: 198.8 pg/mL — ABNORMAL HIGH (ref 0.0–100.0)

## 2021-08-11 NOTE — Patient Instructions (Addendum)
Labs done today, your results will be available in MyChart, we will contact you for abnormal readings.  Your physician has requested that you have a cardiac MRI. Cardiac MRI uses a computer to create images of your heart as its beating, producing both still and moving pictures of your heart and major blood vessels. For further information please visit [email or web address]. Please follow the instruction sheet given to you today for more information.  Your physician recommends that you schedule a follow-up appointment in: 4 months. Call our office in December to schedule an appointment for January 2023    If you have any questions or concerns before your next appointment please send [place] a message through [place] or call our office at [phone].    TO LEAVE A MESSAGE FOR THE NURSE SELECT OPTION 2, PLEASE LEAVE A MESSAGE INCLUDING: YOUR NAME DATE OF BIRTH CALL BACK NUMBER REASON FOR CALL**this is important as we prioritize the call backs  YOU WILL RECEIVE A CALL BACK THE SAME DAY AS LONG AS YOU CALL BEFORE 4:00 PM  At the [facility], you and your health needs are our priority. As part of our continuing mission to provide you with exceptional heart care, we have created designated Provider Care Teams. These Care Teams include your primary Cardiologist (physician) and Advanced Practice Providers (APPs- Physician Assistants and Nurse Practitioners) who all work together to provide you with the care you need, when you need it.   You may see any of the following providers on your designated Care Team at your next follow up: Dr Arvilla Meres Dr Marca Ancona Dr Kitai Melnick, NP Robbie Lis, [place] Mikki Santee Karle Plumber, PharmD   Please be sure to bring in all your medications bottles to every appointment.

## 2021-08-11 NOTE — Progress Notes (Signed)
Advanced Heart Failure Clinic Note   PCP:  [name], MD  GI : Dr Marko Plume.  Primary HF: Dr. [name]  Chief Complaint: Heart Failure follow up   History of Present Illness:  Bradley Powell is a 40 y.o. male with a history of combined systolic/diastolic heart failure,  obesity, HTN, and gout.   Admitted 4/13 through 03/07/17 with marked volume overload. EF 40-45%. Diuresed with IV lasix tid + metolazone. Had RHC/LHC as noted below. Overall diuresed 45 pounds. Discharge weight was 281 pounds.    He underwent atrial flutter ablation on 04/06/18. Subsequently had marked 1st AV block with progression to junctional rhythm. Saw Dr. Ladona Ridgel who wanted to follow it. Suggested cutting back carvedilol.  Previously Bidil stopped due to hypotension.  Admitted  8/21 with intractable N/V of unclear etiology, hypotension and AKI. Creatinine peaked at 4.5. Back to 1.6 at d/c. Has been followed by GI.   cMRI 7/21 EF 45% suspect infiltrative CM. ECV 55%  Volume overloaded at clinic f/u 6/22, weight up 50 lbs. Misunderstood metolazone directions and took for 5 days. Weight down 65 lbs and required IVF in clinic.  Today he returns for HF follow up. At last visit weight was up 10 pounds and diuretics adjusted. Says weight has been up and down. Adjusts torsemide as needed. Edema improved. No orthopnea or PND. Compliant with meds. Still struggling with n/v.   Cardiac Studies  cMRI (7/21): 1. Findings suggestive of infiltrative cardiomyopathy. Native T1 ECV 55%, with diffuse post contrast delayed myocardial enhancement and abnormal T1 myocardial nulling kinetics, consistent with a diagnosis of cardiac amyloidosis. 2.  Upper limit of normal biventricular chamber size. 3. Mildly reduced biventricular systolic function. LVEF 45%, RVEF 44%. 4. Cannot exclude possible anomalous pulmonary venous drainage in to the mid SVC. Seen on axial HASTE image series 3 image 4, but evaluation  limited due to poor spatial resolution. Consider ECG gated CTA chest for further evaluation.  - Echo 2/21: EF 40-45%  - Echo 1/20: EF 35-40% - Echo 9/18: EF ~25-30%.  - Echo 3/19: EF 45-50%  - Holter 7/18: 6% PVCs - Sleep study (10/18): AHI 26/hr  - RHC (2/21): RA = 14 RV = 43/15 PA = 43/21 (31) PCW = 15 Fick cardiac output/index = 5.7/2.4 PVR = 2.8 WU Ao sat = 97% PA sat = 73%, 75% SVC sat = 67%   - R/LHC (4/18): AO = 109/86 (97) LV =  101/28 RA =  29 RV = 50/22 PA = 55/24 (38) PCW = 26 Fick cardiac output/index = 6.4/2.6 Thermal CO/CI = 4.9/2.0 PVR = 2.5 WU FA sat = 98% PA sat = 69%, 71% SVC sat = 82%, 82% IVC sat = 66%   Assessment: 1. Normal coronary arteries  2. Mild to moderate pulmonary HTN with normal PVR 3. Biventricular volume overload with R > L heart failure 4. Moderately decreased CO by thermodilution  5. 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 No intra-cardiac shunt  Past Medical History:  Diagnosis Date   Atrial flutter (HCC)    a. s/p ablation 03/2018.   Chronic combined systolic and diastolic CHF (congestive heart failure) (HCC)    History of esophagogastroduodenoscopy (EGD)    Morbid obesity (HCC)    NICM (nonischemic cardiomyopathy) (HCC)    PVC's (premature ventricular contractions)    Sleep apnea    Past Surgical History:  Procedure Laterality  Date   A-FLUTTER ABLATION N/A 04/06/2018   Procedure: A-FLUTTER ABLATION;  Surgeon: Marinus Maw, MD;  Location: [facility] INVASIVE CV LAB;  Service: Cardiovascular;  Laterality: N/A;   ESOPHAGEAL BRUSHING  06/19/2020   Procedure: ESOPHAGEAL BRUSHING;  Surgeon: Charlott Rakes, MD;  Location: [facility] ENDOSCOPY;  Service: Endoscopy;;   ESOPHAGOGASTRODUODENOSCOPY (EGD) WITH PROPOFOL Left 06/19/2020   Procedure: ESOPHAGOGASTRODUODENOSCOPY (EGD) WITH PROPOFOL;  Surgeon: Charlott Rakes, MD;  Location: [facility] ENDOSCOPY;  Service:  Endoscopy;  Laterality: Left;   NO PAST SURGERIES     RIGHT HEART CATH N/A 12/20/2019   Procedure: RIGHT HEART CATH;  Surgeon: Dolores Patty, MD;  Location: [facility] INVASIVE CV LAB;  Service: Cardiovascular;  Laterality: N/A;   RIGHT/LEFT HEART CATH AND CORONARY ANGIOGRAPHY N/A 03/01/2017   Procedure: Right/Left Heart Cath and Coronary Angiography;  Surgeon: Dolores Patty, MD;  Location: [facility] INVASIVE CV LAB;  Service: Cardiovascular;  Laterality: N/A;    Current Outpatient Medications  Medication Sig Dispense Refill   allopurinol (ZYLOPRIM) 100 MG tablet Take 2 tablets (200 mg total) by mouth daily. Please contact PCP for further refills 60 tablet 0   carvedilol (COREG) 3.125 MG tablet Take 1 tablet (3.125 mg total) by mouth 2 (two) times daily with a meal. 180 tablet 1   ELIQUIS 5 MG TABS tablet TAKE 1 TABLET BY MOUTH TWICE A DAY 180 tablet 3   JARDIANCE 10 MG TABS tablet TAKE 1 TABLET BY MOUTH EVERY DAY BEFORE BREAKFAST. 30 tablet 11   MITIGARE 0.6 MG CAPS Take 0.6 mg by mouth daily as needed (gout flare). 30 capsule 5   ondansetron (ZOFRAN ODT) 4 MG disintegrating tablet Take 1 tablet (4 mg total) by mouth every 8 (eight) hours as needed for nausea or vomiting. 20 tablet 0   pantoprazole (PROTONIX) 40 MG tablet Take 40 mg by mouth daily.     potassium chloride SA (KLOR-CON M20) 20 MEQ tablet Take 2 tablets (40 mEq total) by mouth every morning AND 1 tablet (20 mEq total) every evening. 270 tablet 3   promethazine (PROMETHEGAN) 12.5 MG suppository Place 1 suppository (12.5 mg total) rectally every 6 (six) hours as needed for nausea or vomiting. 12 each 0   spironolactone (ALDACTONE) 25 MG tablet TAKE 1 TABLET BY MOUTH EVERYDAY AT BEDTIME 90 tablet 3   sucralfate (CARAFATE) 1 g tablet Take 1 g by mouth daily.      torsemide (DEMADEX) 20 MG tablet Take 2 tablets (40 mg total) by mouth daily. 180 tablet 1   No current facility-administered medications for this encounter.    Allergies:    Entresto [sacubitril-valsartan]   Social History:  The patient  reports that he has never smoked. He has never used smokeless tobacco. He reports current alcohol use. He reports that he does not use drugs.   Family History:  The patient's family history includes CVA in his mother; Gout in his father; Hypertension in his father; Multiple sclerosis in his mother; Seizures in his mother.   ROS:  Please see the history of present illness.   All other systems are personally reviewed and negative.   BP (P) 104/60   Pulse (P) 70   Wt (P) 122.5 kg (270 lb)   SpO2 (P) 97%   BMI (P) 41.05 kg/m   Recent Labs: 05/12/2021: ALT 33; Hemoglobin 20.0; Platelets 333 06/18/2021: B Natriuretic Peptide 315.5 07/23/2021: BUN 16; Creatinine, Ser 1.41; Potassium 4.2; Sodium 136  Personally reviewed   Wt Readings from Last 3  Encounters:  08/11/21 (P) 122.5 kg (270 lb)  06/30/21 122.8 kg (270 lb 12.8 oz)  06/18/21 124 kg (273 lb 6.4 oz)    Exam:   General:  Well appearing. No resp difficulty HEENT: normal Neck: supple. no JVD. Carotids 2+ bilat; no bruits. No lymphadenopathy or thryomegaly appreciated. Cor: PMI nondisplaced. Regular rate & rhythm. No rubs, gallops or murmurs. Lungs: clear Abdomen: obese soft, nontender, nondistended. No hepatosplenomegaly. No bruits or masses. Good bowel sounds. Extremities: no cyanosis, clubbing, rash, tr edema Neuro: alert & orientedx3, cranial nerves grossly intact. moves all 4 extremities w/o difficulty. Affect pleasant   ASSESSMENT AND PLAN:  1. Combined Systolic/Diastolic Heart Failure- ECHO 02/22/2017 EF 40-45% Grade II DD. Peak PA pressure 46 mmhg. Echo 9/18 EF ~20-25%. Echo 01/2018: EF 40-45%  - Echo 1/20  EF 35-40% with mild RV dysfunction RVSP 40 mmHG - Echo (12/16/19): EF 40-45% Moderate RV dysfunction  - cMRI 8/21 EF 45% + infiltrative process suggestive of possible amyloidosis - Hospitalization 8/21 with volume depletion and AKI. HF meds cut back. - Stable  NYHA II - Volume status mildly elevated. Continue torsemide 40mg  daily. Can take extra as needed - Continue carvedilol 3.125 mg bid. (Previously cut back with conduction system disease and bradycardia). - Continue spiro 25 mg daily.  - Continue Jardiance 10 mg daily. - Off losartan due to AKI and vomiting.Will not restart currently with low BP - Failed Bidil with hypotension.  - Has been following with Dr. for Genetics Counseling. Felt to have characteristics for a genetic CM but not to have pathogenic variant for HCM.  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  - Genetic testing 2021 "uncertain variant for HCM. Negative for TTR"  - MM panel negative.  - PYP 4/22. Ratio 1.26 read as equivocal.  - Given MRI findings and equivocal PYP suspect next step will need to consider endomyocardial biopsy. We will repeat cMRI first to see if any change. Also I will talk to Pathology to see if we can stain previous liver bx to look for amyloid.    2. Possible cirrhosis, felt to be congestive hepatopathy - Suspect related to chronic RV failure in setting of OSA/OHS. +/- fatty liver. - RHC (2/21) with only minimally elevated PA pressures (much lower than I would suspect given his degree of cirrhosis on MRI). - Following with [facility] and [facility]. core liver bx (11/21) & abd 06-16-2003 (1/22)  - Biopsy results: chronic venous outflow obstruction (congestive hepatopathy) including centrilobular and midzonal sinusoidal dilatation, congestion, and centrilobular as well as portal fibrosis with focal nodule formation  3. Sleep Apnea  - Continue CPAP.  4. Pulmonary HTN - RV strain on echo.  - RHC in 2/21 minimal PAH with low PAPi. - Needs to continue CPAP.   5. Morbid Obesity: - Body mass index is 41.05 kg/m (pended).  - Continue weight loss efforts  6. PVCs - ? If cardiomyopathy in part PVC-related. 24-hour monitor (7/18)  PVCs 6%. - NSR with AV dissociation by EKG previously.  - Denies  palpitations. - Follows with EP   7. Paroxysmal atrial fib/Atrial flutter  - S/p a flutter ablation 04/06/18 with Dr. 04/08/18. - Previous EKG NSR with AV dissociation and junctional rhythm at 77 bpm.   - Has seen Dr. Ladona Ridgel, unlikely to maintain SR with DCCV, could consider Tikosyn. - Continue rate control strategy for now.  - On Eliquis. No bleeding  8. Persistent n/v - has been followed [facility] and  [facility] - Has had EGD and gastric emptying studying - all normal - ? Possible amyloid    Signed, Arvilla Meres, MD  08/11/2021 4:19 PM  Advanced Heart Failure Clinic [facility] [address] [facility] [place] [address] [phone] (office) [phone] (fax)

## 2021-08-11 NOTE — Progress Notes (Signed)
ReDS Vest / Clip - 08/11/21 1500       ReDS Vest / Clip   Station Marker D    Ruler Value 34    ReDS Value Range Moderate volume overload    ReDS Actual Value 40

## 2021-08-18 ENCOUNTER — Telehealth (HOSPITAL_COMMUNITY): Payer: Self-pay | Admitting: *Deleted

## 2021-08-18 NOTE — Telephone Encounter (Signed)
CMRI auth faxed to [facility] at  [phone]

## 2021-09-14 ENCOUNTER — Telehealth (HOSPITAL_COMMUNITY): Payer: Self-pay | Admitting: Emergency Medicine

## 2021-09-14 NOTE — Telephone Encounter (Signed)
Reaching out to patient to offer assistance regarding upcoming cardiac imaging study; pt verbalizes understanding of appt date/time, parking situation and where to check in, and verified current allergies; name and call back number provided for further questions should they arise Rockwell Alexandria RN Navigator Cardiac Imaging [facility] [phone] office [phone] cell   Denies iv issues Denies implants

## 2021-09-16 ENCOUNTER — Other Ambulatory Visit: Payer: Self-pay

## 2021-09-16 ENCOUNTER — Ambulatory Visit (HOSPITAL_COMMUNITY)
Admission: RE | Admit: 2021-09-16 | Discharge: 2021-09-16 | Disposition: A | Discharge status: Home / Self Care | Payer: 59 | Source: Ambulatory Visit | Attending: Internal Medicine | Admitting: Internal Medicine

## 2021-09-16 DIAGNOSIS — I428 Other cardiomyopathies: Secondary | ICD-10-CM

## 2021-09-16 IMAGING — MR MR CARD MORPHOLOGY WO/W CM
45 of 48 series · 45 of 48 positions shown · IV contrast (Gadavist)
Comparison: none

CLINICAL DATA: 40M with chronic combined heart failure. Most recent
echo [DATE] EF 45-50%. Cath with normal coronaries. JUMPER [DATE]
markedly elevated ECV (55%) and diffuse LGE concerning for
amyloidosis. PYP scan equivocal.

EXAM:
CARDIAC MRI
TECHNIQUE: The patient was scanned on a 1.5 Tesla Siemens magnet. A dedicated
cardiac coil was used. Functional imaging was done using Fiesta
sequences. [DATE], and 4 chamber views were done to assess for RWMA's.
Modified JUMPER rule using a short axis stack was used to
calculate an ejection fraction on a dedicated work station using
Circle software. The patient received 15 cc of Gadavist. After 10
minutes inversion recovery sequences were used to assess for
infiltration and scar tissue.
CONTRAST:  15cc  of Gadavist

[Series 4: [id number]_bh · axial · 8.0mm · 1.41mm/px · 1 of 16 slices shown]
[im 1/16]
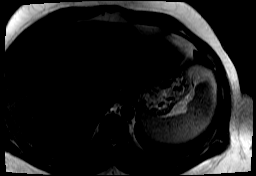

[Series 8: bSSFP · oblique · 8.0mm · 1.70mm/px · 1 of 25 slices shown (1 of 25)]
[im 1/25]
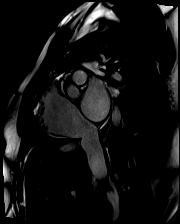

[Series 9: bSSFP · oblique · 8.0mm · 1.70mm/px · 1 of 25 slices shown (2 of 25)]
[im 1/25]
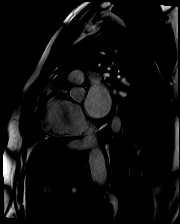

[Series 10: bSSFP · oblique · 8.0mm · 1.70mm/px · 1 of 25 slices shown (3 of 25)]
[im 1/25]
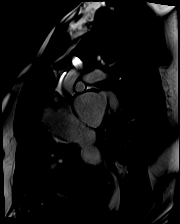

[Series 11: bSSFP · oblique · 8.0mm · 1.70mm/px · 1 of 25 slices shown (4 of 25)]
[im 1/25]
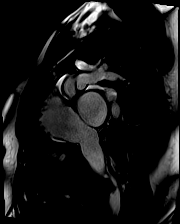

[Series 12: bSSFP · oblique · 8.0mm · 1.70mm/px · 1 of 25 slices shown (5 of 25)]
[im 1/25]
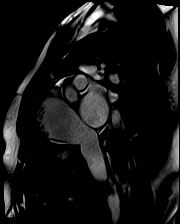

[Series 13: bSSFP · oblique · 8.0mm · 1.70mm/px · 1 of 25 slices shown (6 of 25)]
[im 1/25]
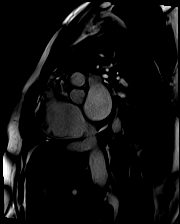

[Series 14: bSSFP · oblique · 8.0mm · 1.70mm/px · 1 of 25 slices shown (7 of 25)]
[im 1/25]
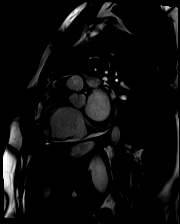

[Series 15: bSSFP · oblique · 8.0mm · 1.70mm/px · 1 of 25 slices shown (8 of 25)]
[im 1/25]
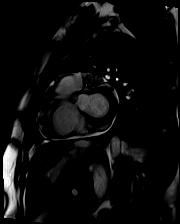

[Series 16: bSSFP · oblique · 8.0mm · 1.70mm/px · 1 of 25 slices shown (9 of 25)]
[im 1/25]
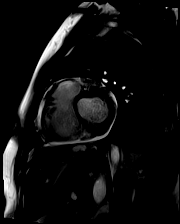

[Series 17: bSSFP · oblique · 8.0mm · 1.70mm/px · 1 of 25 slices shown (10 of 25)]
[im 1/25]
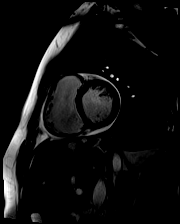

[Series 18: bSSFP · oblique · 8.0mm · 1.70mm/px · 1 of 25 slices shown (11 of 25)]
[im 1/25]
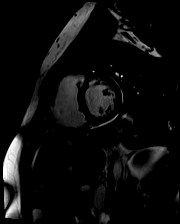

[Series 19: bSSFP · oblique · 8.0mm · 1.70mm/px · 1 of 25 slices shown (12 of 25)]
[im 1/25]
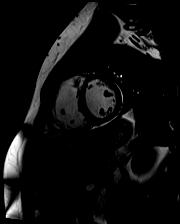

[Series 20: bSSFP · oblique · 8.0mm · 1.70mm/px · 1 of 25 slices shown (13 of 25)]
[im 1/25]
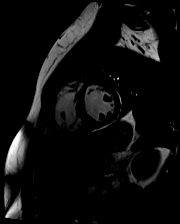

[Series 21: bSSFP · oblique · 8.0mm · 1.70mm/px · 1 of 25 slices shown (14 of 25)]
[im 1/25]
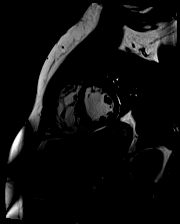

[Series 22: bSSFP · oblique · 8.0mm · 1.70mm/px · 1 of 25 slices shown (15 of 25)]
[im 1/25]
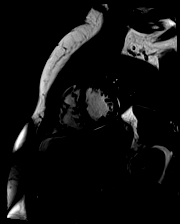

[Series 23: bSSFP · oblique · 8.0mm · 1.70mm/px · 1 of 25 slices shown (16 of 25)]
[im 1/25]
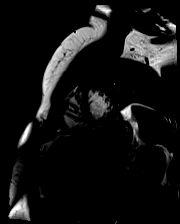

[Series 24: bSSFP · oblique · 8.0mm · 1.70mm/px · 1 of 25 slices shown (17 of 25)]
[im 1/25]
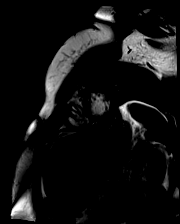

[Series 25: bSSFP · oblique · 8.0mm · 1.70mm/px · 1 of 25 slices shown (18 of 25)]
[im 1/25]
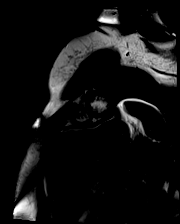

[Series 26: bSSFP · oblique · 8.0mm · 1.70mm/px · 1 of 25 slices shown (19 of 25)]
[im 1/25]
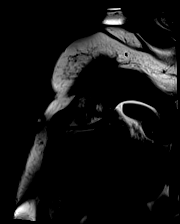

[Series 27: bSSFP · oblique · 8.0mm · 1.70mm/px · 1 of 25 slices shown (20 of 25)]
[im 1/25]
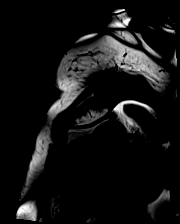

[Series 28: bSSFP · oblique · 8.0mm · 1.70mm/px · 1 of 25 slices shown (21 of 25)]
[im 1/25]
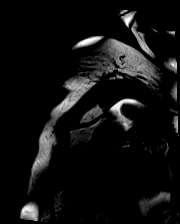

[Series 29: bSSFP · oblique · 8.0mm · 1.70mm/px · 1 of 25 slices shown (22 of 25)]
[im 1/25]
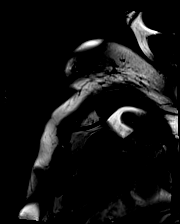

[Series 30: (id)_long_t1 · oblique · 8.0mm · 1.56mm/px · 1 of 24 slices shown]
[im 1/24]
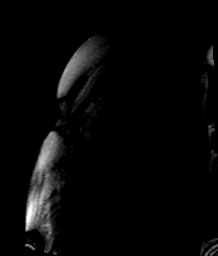

[Series 31: (id)_long_t1_moco · oblique · 8.0mm · 1.56mm/px · 1 of 24 slices shown]
[im 1/24]
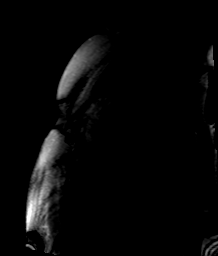

[Series 32: (id)_long_t1_moco_t1 · oblique · 8.0mm · 1.56mm/px · 1 of 3 slices shown (1 of 2)]
[im 1/3]
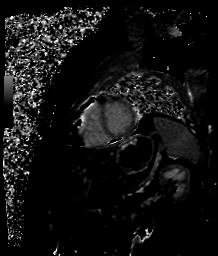

[Series 32: (id)_long_t1_moco_t1 · 1 of 3 slices shown (2 of 2)]
[im 1/3]
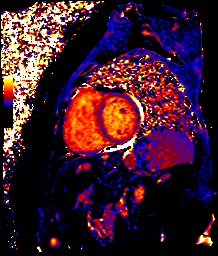

[Series 34: (id)_trufi · oblique · 8.0mm · 2.08mm/px · 1 of 9 slices shown]
[im 1/9]
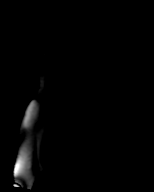

[Series 35: (id)_trufi_moco · oblique · 8.0mm · 2.08mm/px · 1 of 9 slices shown]
[im 1/9]
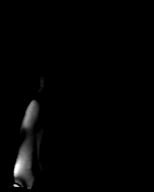

[Series 36: (id)_trufi_moco_t2 · oblique · 8.0mm · 2.08mm/px · 1 of 3 slices shown]
[im 1/3]
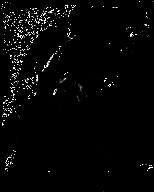

[Series 38: bSSFP · oblique · 6.0mm · 1.48mm/px · 1 of 25 slices shown (23 of 25)]
[im 1/25]
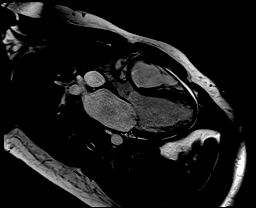

[Series 39: bSSFP · oblique · 6.0mm · 1.48mm/px · 1 of 25 slices shown (24 of 25)]
[im 1/25]
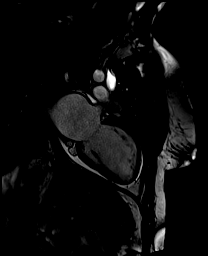

[Series 40: bSSFP · axial · 6.0mm · 1.48mm/px · 1 of 25 slices shown (25 of 25)]
[im 1/25]
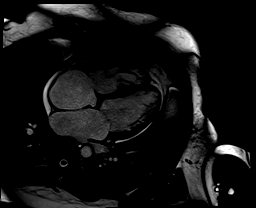

[Series 41: cine_trufi_cs_rt_short axis · oblique · 8.0mm · 1.83mm/px · 1 of 14 slices shown (1 of 12)]
[im 1/14]
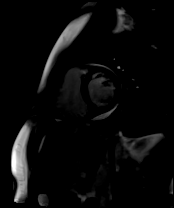

[Series 41: cine_trufi_cs_rt_short axis · oblique · 8.0mm · 1.83mm/px · 1 of 14 slices shown (2 of 12)]
[im 1/14]
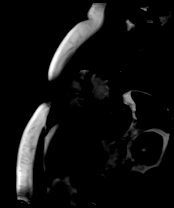

[Series 41: cine_trufi_cs_rt_short axis · oblique · 8.0mm · 1.83mm/px · 1 of 14 slices shown (3 of 12)]
[im 1/14]
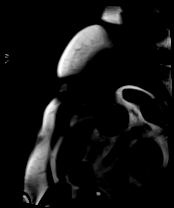

[Series 41: cine_trufi_cs_rt_short axis · oblique · 8.0mm · 1.83mm/px · 1 of 14 slices shown (4 of 12)]
[im 1/14]
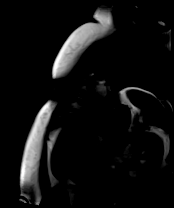

[Series 41: cine_trufi_cs_rt_short axis · oblique · 8.0mm · 1.83mm/px · 1 of 14 slices shown (5 of 12)]
[im 1/14]
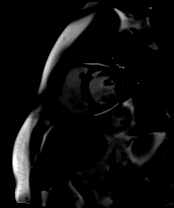

[Series 41: cine_trufi_cs_rt_short axis · oblique · 8.0mm · 1.83mm/px · 1 of 14 slices shown (6 of 12)]
[im 1/14]
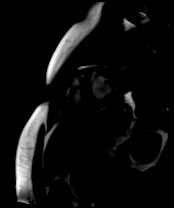

[Series 41: cine_trufi_cs_rt_short axis · oblique · 8.0mm · 1.83mm/px · 1 of 14 slices shown (7 of 12)]
[im 1/14]
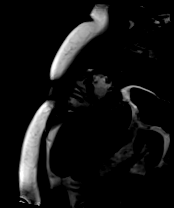

[Series 41: cine_trufi_cs_rt_short axis · oblique · 8.0mm · 1.83mm/px · 1 of 14 slices shown (8 of 12)]
[im 1/14]
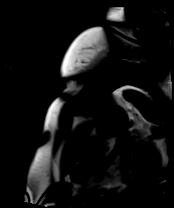

[Series 41: cine_trufi_cs_rt_short axis · oblique · 8.0mm · 1.83mm/px · 1 of 14 slices shown (9 of 12)]
[im 1/14]
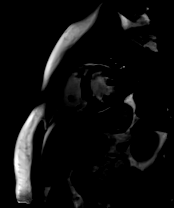

[Series 41: cine_trufi_cs_rt_short axis · oblique · 8.0mm · 1.83mm/px · 1 of 14 slices shown (10 of 12)]
[im 1/14]
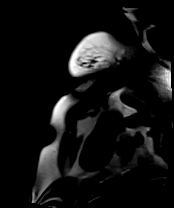

[Series 41: cine_trufi_cs_rt_short axis · oblique · 8.0mm · 1.83mm/px · 1 of 14 slices shown (11 of 12)]
[im 1/14]
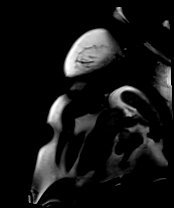

[Series 41: cine_trufi_cs_rt_short axis · oblique · 8.0mm · 1.83mm/px · 1 of 14 slices shown (12 of 12)]
[im 1/14]
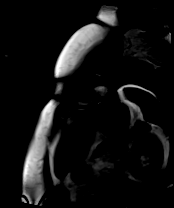

[45 of 48 positions shown; findings below may reference images not displayed]

FINDINGS: Left ventricle:

-Normal wall thickness

-Normal size

-Mild systolic dysfunction

-Elevated ECV (43%)

-Regional elevation in T2 (up to 71ms in apical septum)

-Diffuse LGE, most notably midwall basal septum extending into RV,
subepicardial in inferior wall, midwall in lateral wall, and
circumferential at apex

LV EF: 43% (Normal 56-78%)

Absolute volumes:

LV EDV: 186mL (Normal 77-195 mL)

LV ESV: 106mL (Normal 19-72 mL)

LV SV: 80mL (Normal 51-133 mL)

CO: 5.4L/min (Normal 2.8-8.8 L/min)

Indexed volumes:

LV EDV: 77mL/sq-m (Normal 47-92 mL/sq-m)

LV ESV: 44mL/sq-m (Normal 13-30 mL/sq-m)

LV SV: 33mL/sq-m (Normal 32-62 mL/sq-m)

CI: 2.2L/min/sq-m (Normal 1.7-4.2 L/min/sq-m)

Right ventricle: Mild dilatation with mild systolic dysfunction

RV EF:  44% (Normal 47-74%)

Absolute volumes:

RV EDV: 261mL (Normal 88-227 mL)

RV ESV: 147mL (Normal 23-103 mL)

RV SV: 114mL (Normal 52-138 mL)

CO: 7.7L/min (Normal 2.8-8.8 L/min)

Indexed volumes:

RV EDV: 108mL/sq-m (Normal 55-105 mL/sq-m)

RV ESV: 61mL/sq-m (Normal 15-43 mL/sq-m)

RV SV: 47mL/sq-m (Normal 32-64 mL/sq-m)

CI: 3.1L/min/sq-m (Normal 1.7-4.2 L/min/sq-m)

Left atrium: Moderate enlargement

Right atrium: Moderate enlargement

Mitral valve: Trivial regurgitation

Aortic valve: No regurgitation

Tricuspid valve: Trivial regurgitation

Pulmonic valve: Trivial regurgitation

Aorta: Normal proximal ascending aorta

Pericardium: Small effusion
IMPRESSION: 1. While ECV is markedly elevated (43%), which is in the amyloid
range, LV morphology does not suggest amyloid as wall thickness is
normal. Rather, LGE pattern suggests cardiac sarcoidosis. Suspect
marked ECV elevation is due to diffuse LGE in setting of
sarcoidosis. Recommend CT chest to evaluate for pulmonary sarcoid
and cardiac PET to evaluate for active inflammation

2. Diffuse LGE, most notably midwall basal septum extending into RV,
subepicardial in inferior wall, midwall in lateral wall, and
circumferential at apex. Concerning for sarcoidosis as above

3. Regional elevation in T2 values (up to 71ms in apical septum)
suggestive of myocardial inflammation, as can be seen in sarcoid

4.  Normal LV size with mild systolic dysfunction (EF 43%)

5.  Mild RV dilatation with mild systolic dysfunction (EF 44%)

6.  Small pericardial effusion

## 2021-09-16 MED ORDER — GADOBUTROL 1 MMOL/ML IV SOLN
15.0000 mL | Freq: Once | INTRAVENOUS | Status: AC | PRN
  Administered 2021-09-16: 15 mL via INTRAVENOUS

## 2021-10-21 ENCOUNTER — Other Ambulatory Visit (HOSPITAL_COMMUNITY): Payer: Self-pay | Admitting: *Deleted

## 2021-10-21 DIAGNOSIS — I428 Other cardiomyopathies: Secondary | ICD-10-CM

## 2021-10-21 NOTE — Progress Notes (Signed)
Bensimhon, Bradley Buckles, MD  Noralee Space, RN Please get chest CT to evaluate for sarcoid (I think non contrast is sufficeint)

## 2021-10-22 ENCOUNTER — Telehealth (HOSPITAL_COMMUNITY): Payer: Self-pay | Admitting: *Deleted

## 2021-10-22 NOTE — Telephone Encounter (Addendum)
Auth request for ct chest w/o faxed to [facility]

## 2021-10-25 NOTE — Telephone Encounter (Signed)
Received fax from [facility] to process prior authorization via [employer]. Auth request faxed to [employer] at [phone]

## 2021-10-27 ENCOUNTER — Telehealth (HOSPITAL_COMMUNITY): Payer: Self-pay | Admitting: Cardiology

## 2021-10-27 DIAGNOSIS — I5042 Chronic combined systolic (congestive) and diastolic (congestive) heart failure: Secondary | ICD-10-CM

## 2021-10-27 NOTE — Telephone Encounter (Signed)
Pt aware of mri results and voiced understanding Order placed 10/21/21 Bradley Powell [id number] valid til 001/10/2022 Message sent to schedulers to assist with appt -pt will need BMET one week prior as well

## 2021-10-29 ENCOUNTER — Other Ambulatory Visit (HOSPITAL_COMMUNITY): Payer: Self-pay | Admitting: Family Medicine

## 2021-11-10 ENCOUNTER — Other Ambulatory Visit: Payer: Self-pay

## 2021-11-10 ENCOUNTER — Ambulatory Visit (HOSPITAL_BASED_OUTPATIENT_CLINIC_OR_DEPARTMENT_OTHER)
Admission: RE | Admit: 2021-11-10 | Discharge: 2021-11-10 | Disposition: A | Discharge status: Home / Self Care | Payer: 59 | Source: Ambulatory Visit | Attending: Internal Medicine | Admitting: Internal Medicine

## 2021-11-10 ENCOUNTER — Encounter (HOSPITAL_BASED_OUTPATIENT_CLINIC_OR_DEPARTMENT_OTHER): Payer: Self-pay

## 2021-11-10 DIAGNOSIS — I428 Other cardiomyopathies: Secondary | ICD-10-CM | POA: Diagnosis present

## 2021-11-10 IMAGING — CT CT CHEST W/O CM
2 of 4 series · 15 of 36 positions shown, 18 images · non-contrast
Comparison: Chest x-ray dated [DATE].

CLINICAL DATA: Nonischemic cardiomyopathy. Possible cardiac
sarcoidosis. Evaluate for pulmonary sarcoidosis.

EXAM:
CT CHEST WITHOUT CONTRAST
TECHNIQUE: Multidetector CT imaging of the chest was performed following the
standard protocol without IV contrast.

[Series 2: routine chest without · axial · non-contrast · 0.79mm/px · z∈[+1084,+1346]mm · 12 of 155 slices shown, 15 images]
[im 12/155  mediastinal]
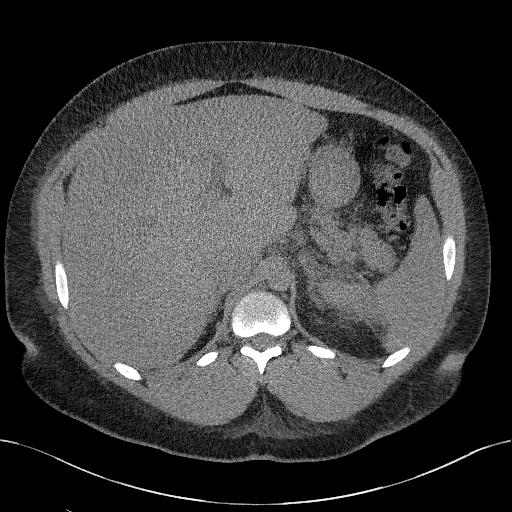
[im 12/155  lung]
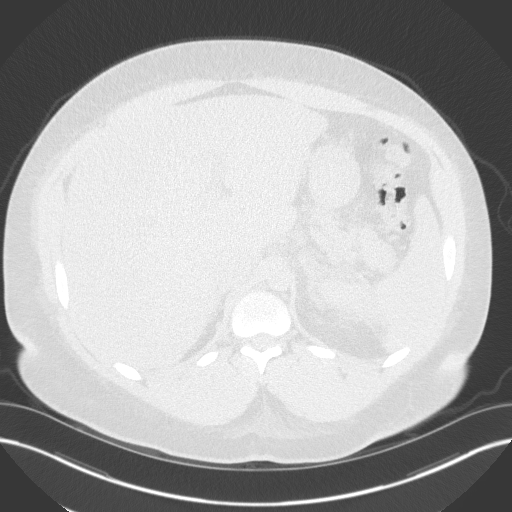
[im 24/155  lung]
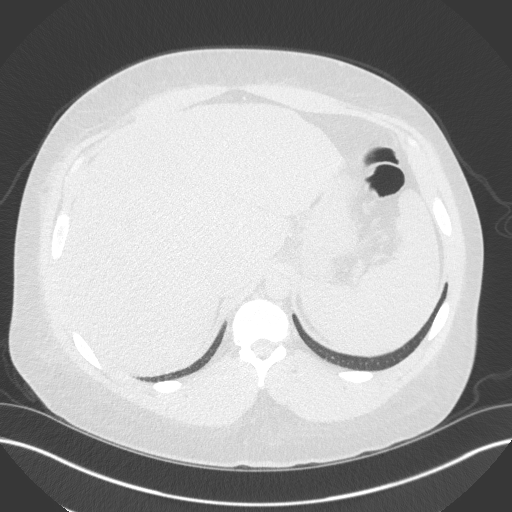
[im 36/155  lung]
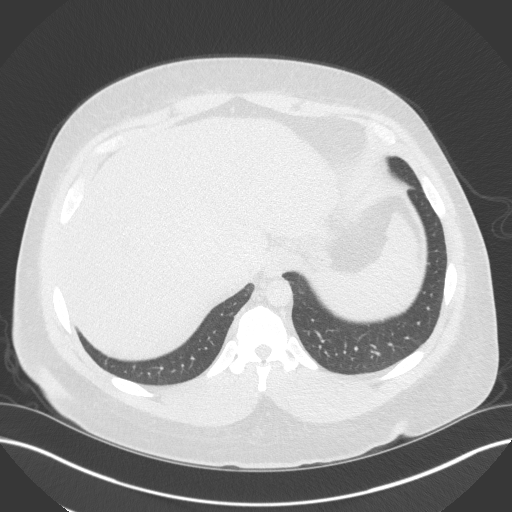
[im 48/155  lung]
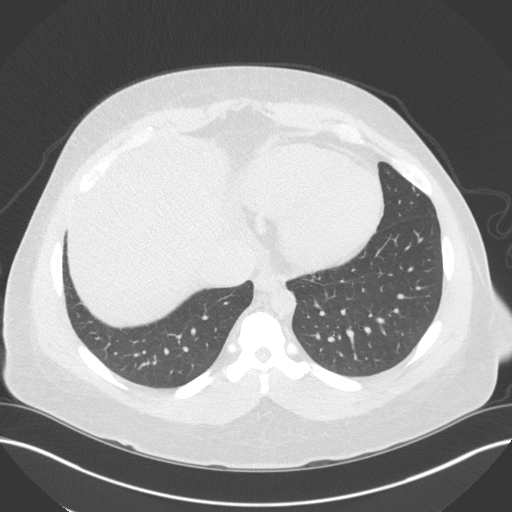
[im 60/155  mediastinal]
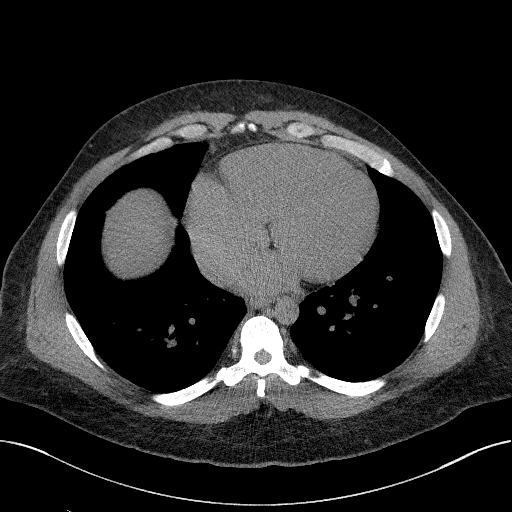
[im 60/155  lung]
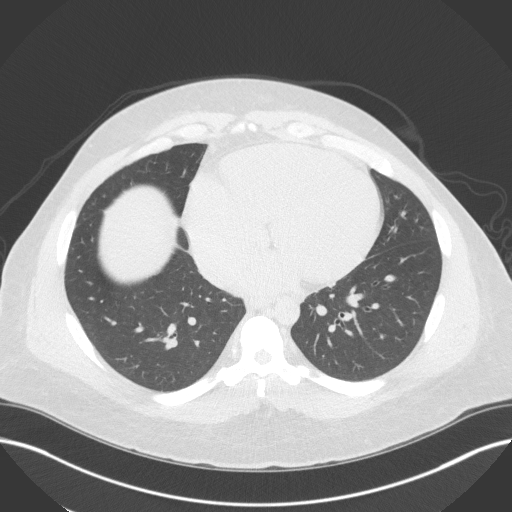
[im 72/155  lung]
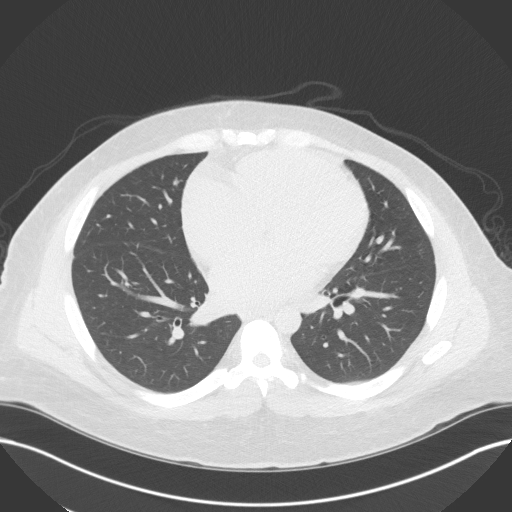
[im 83/155  lung]
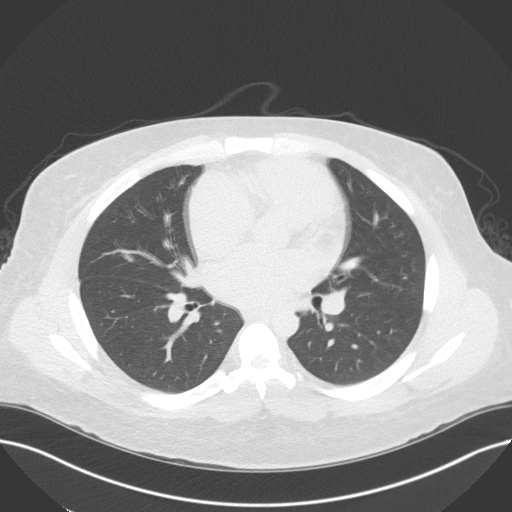
[im 95/155  lung]
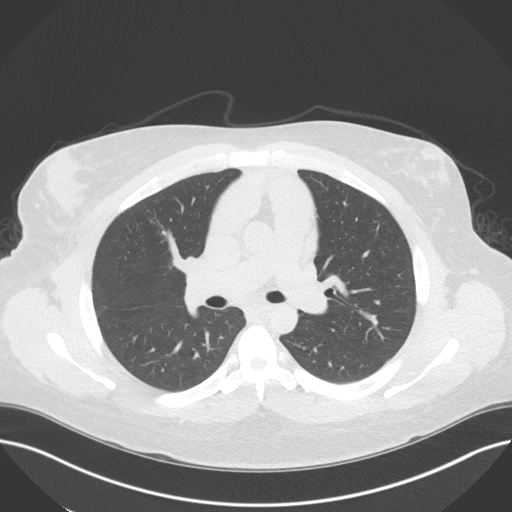
[im 107/155  mediastinal]
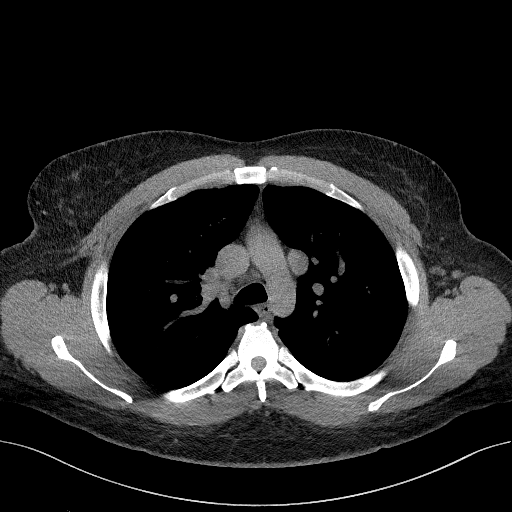
[im 107/155  lung]
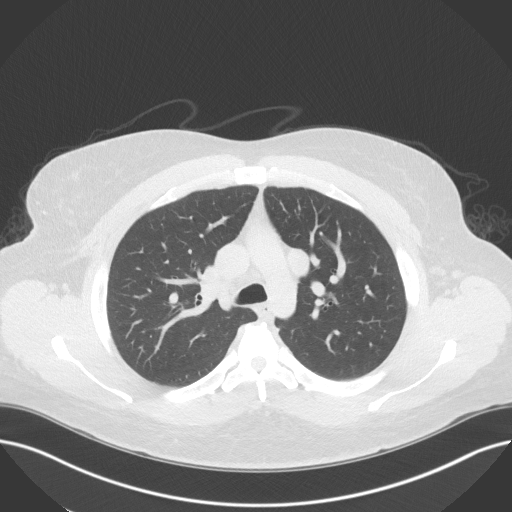
[im 119/155  lung]
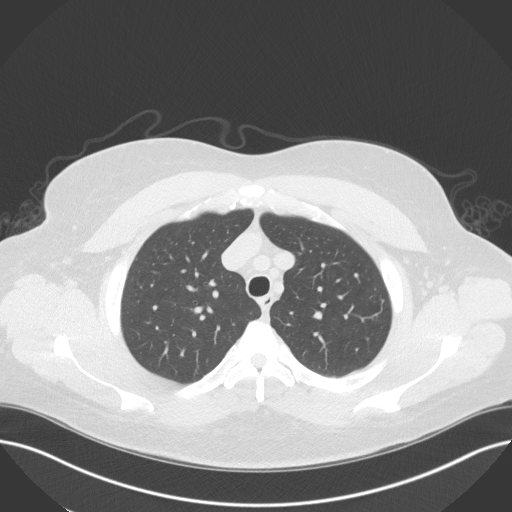
[im 131/155  lung]
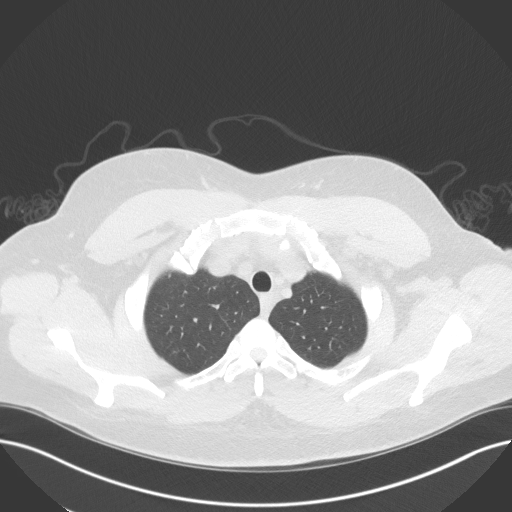
[im 143/155  lung]
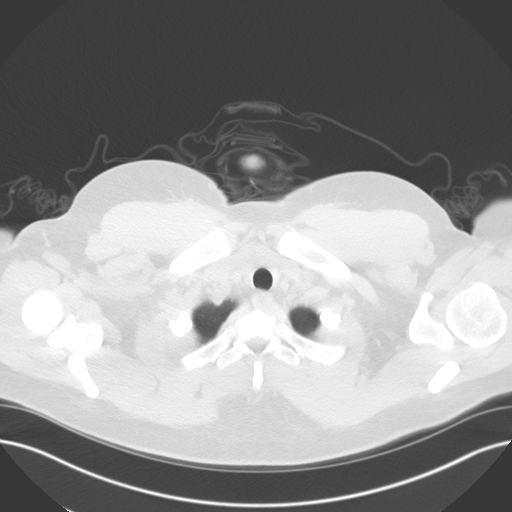

[Series 5: coronal · coronal · 0.61mm/px · 3 of 164 slices shown]
[im 33/164  lung]
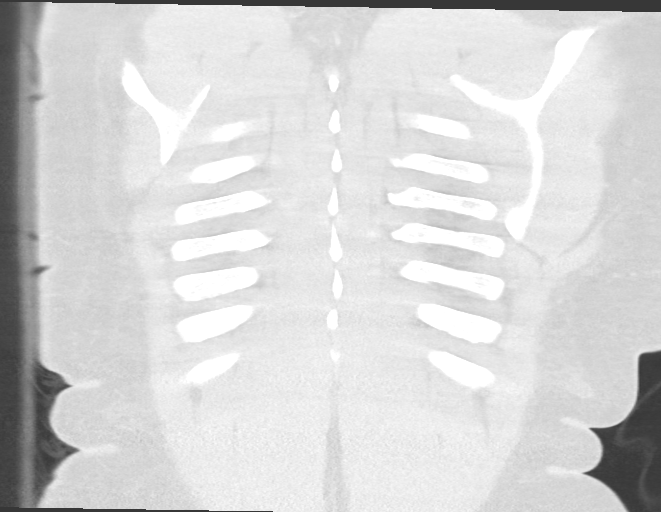
[im 66/164  lung]
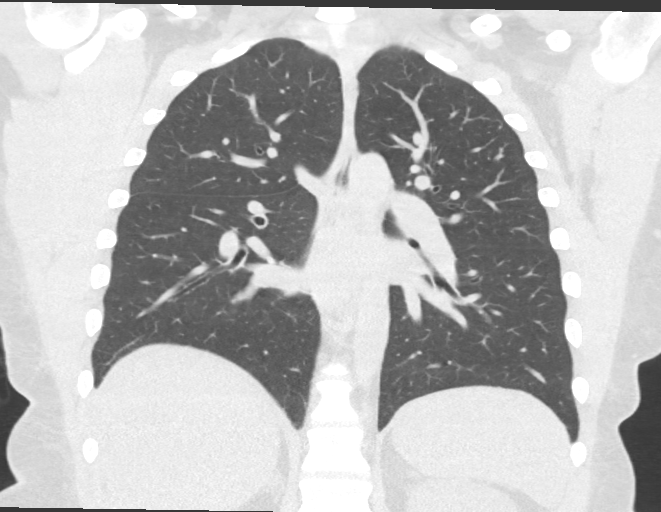
[im 98/164  lung]
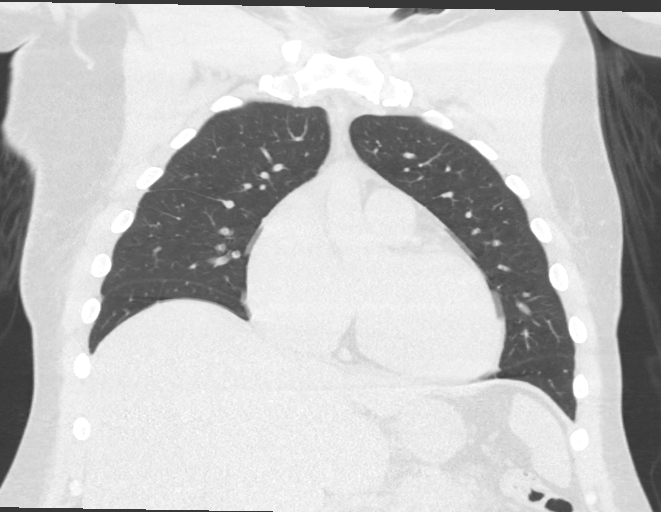

[15 of 36 positions shown; findings below may reference images not displayed]

FINDINGS: Cardiovascular: Moderate cardiomegaly. No pericardial effusion.
Partial anomalous pulmonary venous drainage of the left upper lobe
with dilated left superior pulmonary vein draining into the left
brachiocephalic vein (series 5, image 85). No thoracic aortic
aneurysm.

Mediastinum/Nodes: No enlarged mediastinal or axillary lymph nodes.
Thyroid gland, trachea, and esophagus demonstrate no significant
findings.

Lungs/Pleura: Lungs are clear. No pleural effusion or pneumothorax.

Upper Abdomen: Nodular liver contour.  Mild splenomegaly.

Musculoskeletal: No chest wall mass or suspicious bone lesions
identified.
IMPRESSION: 1. No evidence of pulmonary sarcoidosis.
2. Partial anomalous pulmonary venous drainage of the left upper
lobe.
3. Moderate cardiomegaly.
4. Cirrhosis and mild splenomegaly.

## 2021-12-21 ENCOUNTER — Telehealth (HOSPITAL_COMMUNITY): Payer: Self-pay | Admitting: Pharmacy Technician

## 2021-12-21 NOTE — Telephone Encounter (Signed)
Patient Advocate Encounter   Received notification from [employer] that prior authorization for [place] [employer] is required.   PA submitted on CoverMyMeds Key [id number] Status is pending   Will continue to follow.

## 2021-12-23 DIAGNOSIS — G4733 Obstructive sleep apnea (adult) (pediatric): Secondary | ICD-10-CM | POA: Diagnosis not present

## 2021-12-28 ENCOUNTER — Other Ambulatory Visit (HOSPITAL_COMMUNITY): Payer: Self-pay

## 2021-12-31 ENCOUNTER — Other Ambulatory Visit (HOSPITAL_COMMUNITY): Payer: Self-pay

## 2021-12-31 NOTE — Telephone Encounter (Signed)
Advanced Heart Failure Patient Advocate Encounter  Prior Authorization for [place] [employer] has been approved.    PA# [id number] Effective dates: 12/30/21 through 12/30/22  Patients co-pay is $50 (insurance limits to 30 day claims)  Called and left the patient a message.   Archer Asa, CPhT

## 2022-01-03 ENCOUNTER — Other Ambulatory Visit: Payer: Self-pay

## 2022-01-03 ENCOUNTER — Ambulatory Visit: Payer: Self-pay | Attending: Internal Medicine

## 2022-01-03 ENCOUNTER — Ambulatory Visit
Admission: RE | Admit: 2022-01-03 | Discharge: 2022-01-03 | Disposition: A | Discharge status: Home / Self Care | Payer: 59 | Source: Ambulatory Visit | Attending: Gastroenterology | Admitting: Gastroenterology

## 2022-01-03 ENCOUNTER — Other Ambulatory Visit (HOSPITAL_BASED_OUTPATIENT_CLINIC_OR_DEPARTMENT_OTHER): Payer: Self-pay

## 2022-01-03 DIAGNOSIS — K7460 Unspecified cirrhosis of liver: Secondary | ICD-10-CM | POA: Diagnosis not present

## 2022-01-03 DIAGNOSIS — Z23 Encounter for immunization: Secondary | ICD-10-CM

## 2022-01-03 DIAGNOSIS — K7689 Other specified diseases of liver: Secondary | ICD-10-CM | POA: Diagnosis not present

## 2022-01-03 DIAGNOSIS — R188 Other ascites: Secondary | ICD-10-CM | POA: Diagnosis not present

## 2022-01-03 DIAGNOSIS — R110 Nausea: Secondary | ICD-10-CM | POA: Diagnosis not present

## 2022-01-03 IMAGING — US US ABDOMEN COMPLETE
1 series · 14 of 25 positions shown · non-contrast
Comparison: [DATE]

CLINICAL DATA: Cirrhosis, nausea.

EXAM:
ABDOMEN ULTRASOUND COMPLETE

[Series 1: us abdomen complete · 0.31mm/px · 14 of 90 slices shown]
[im 1/90]
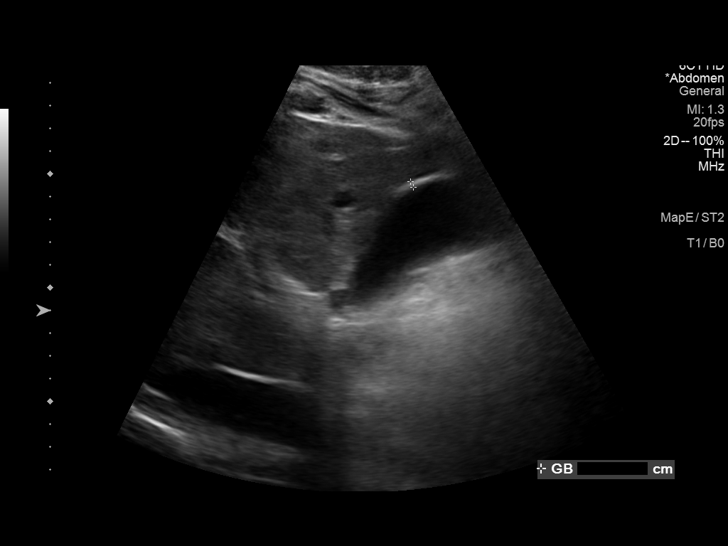
[im 8/90]
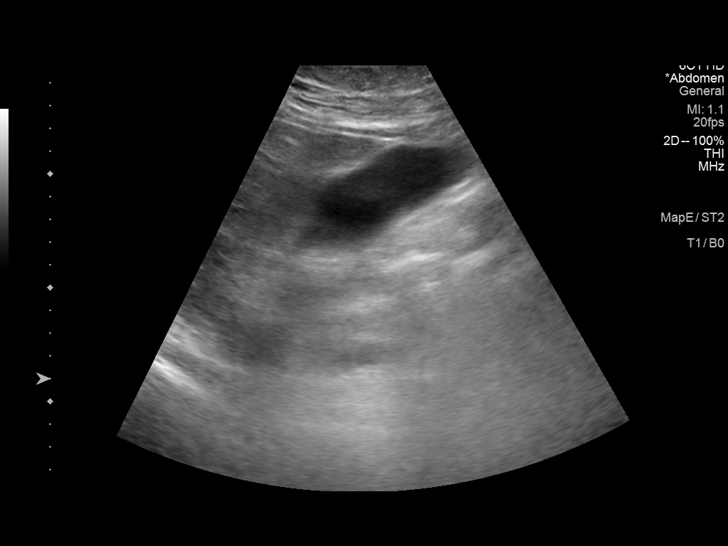
[im 15/90]
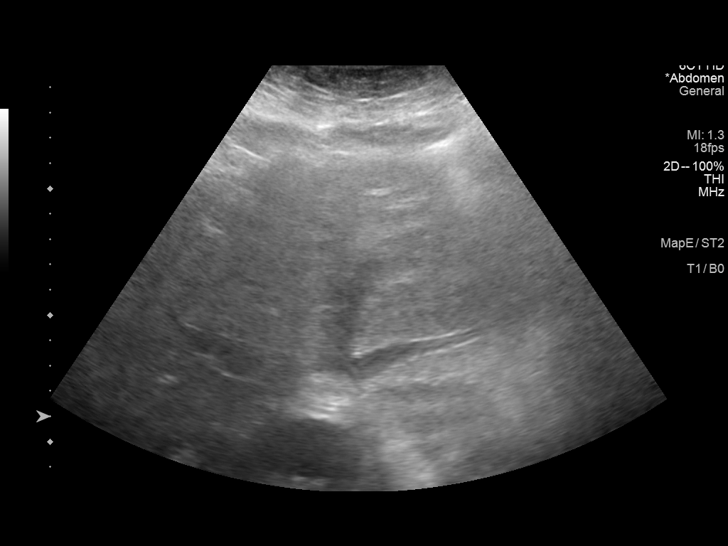
[im 23/90]
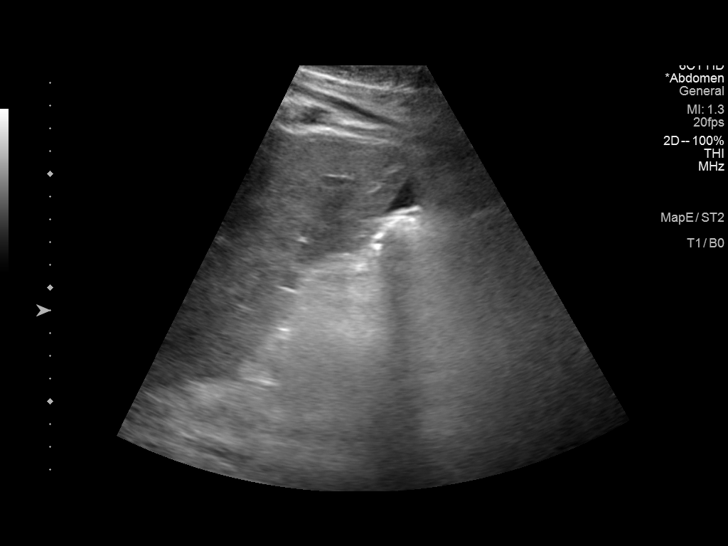
[im 30/90]
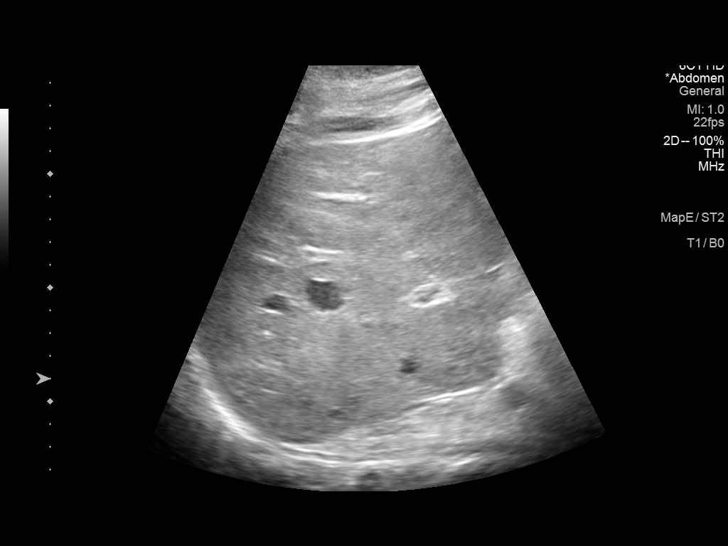
[im 34/90]
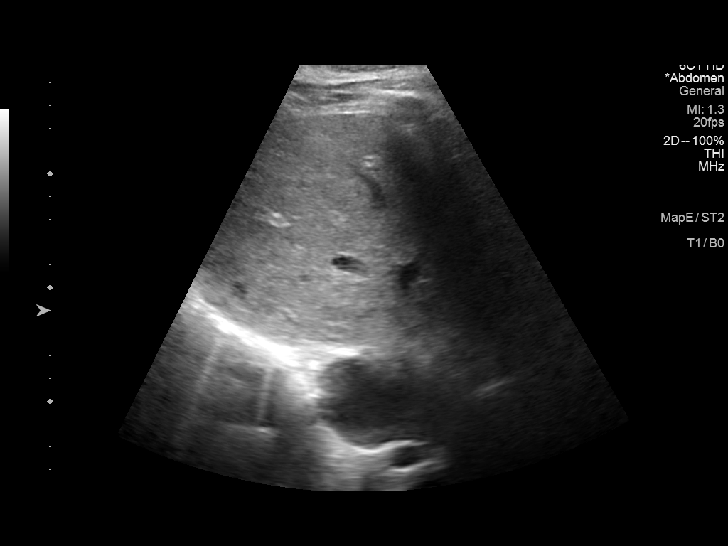
[im 41/90]
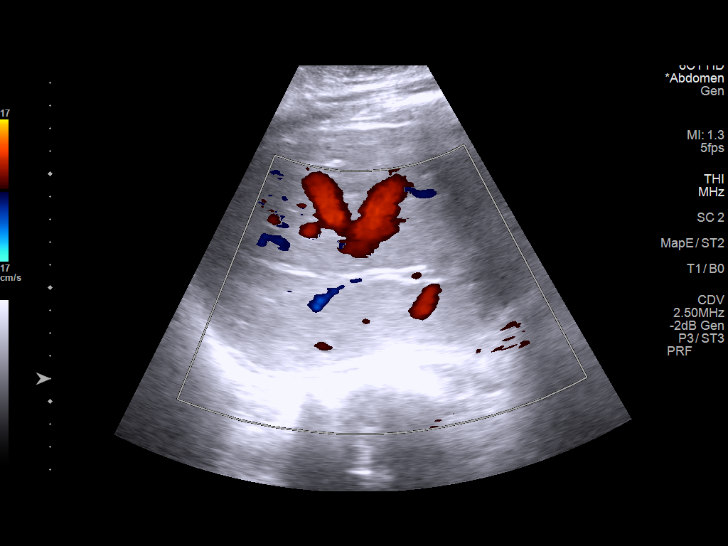
[im 49/90]
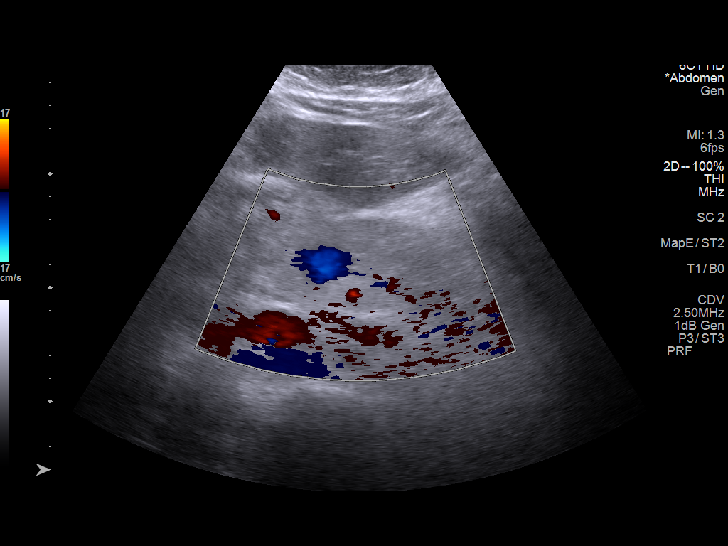
[im 56/90]
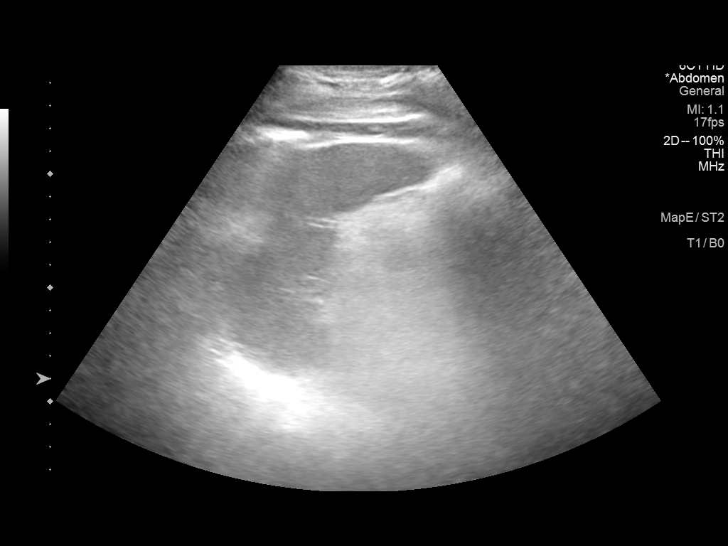
[im 60/90]
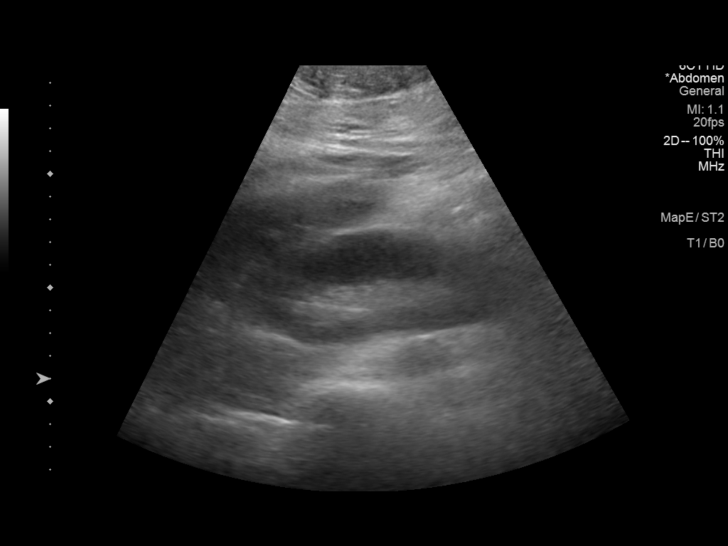
[im 67/90]
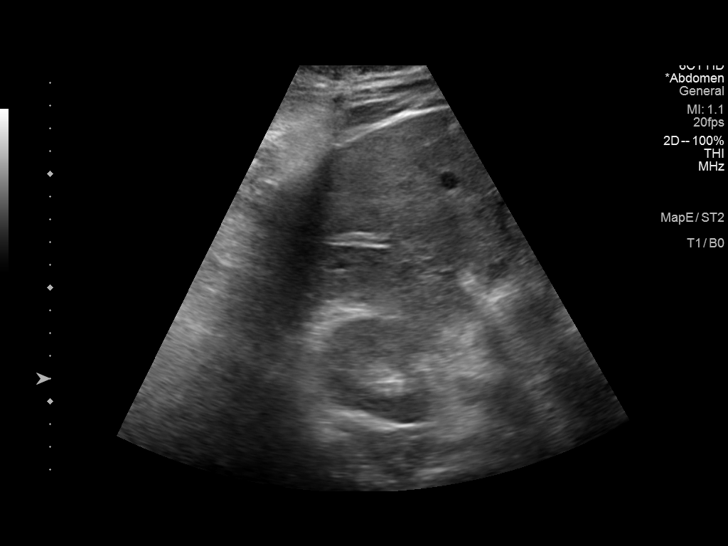
[im 75/90]
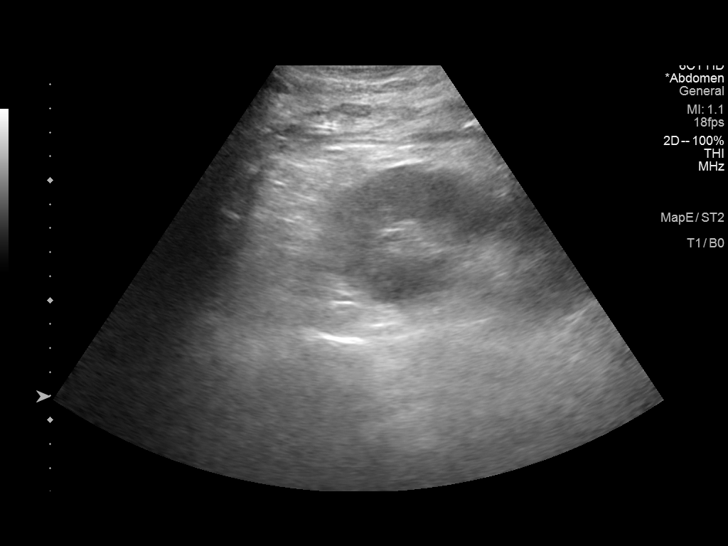
[im 82/90]
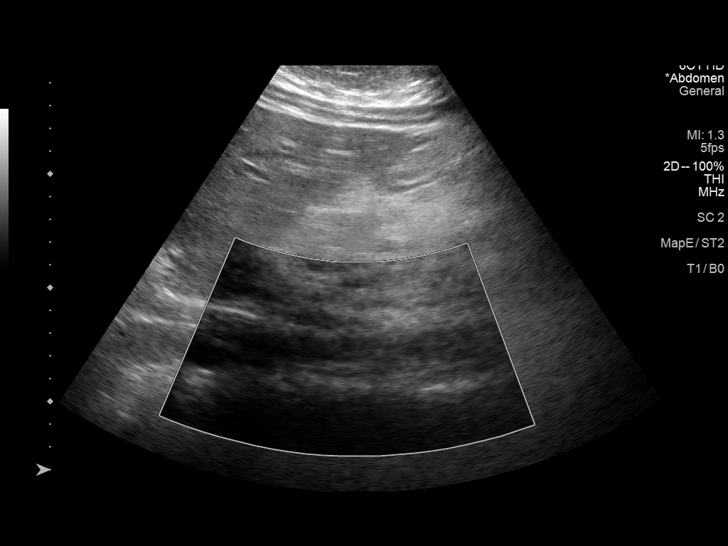
[im 90/90]
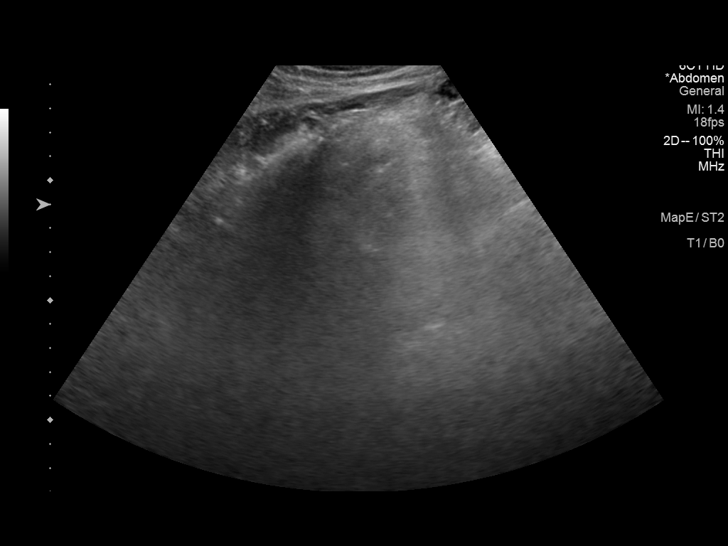

[14 of 25 positions shown; findings below may reference images not displayed]

FINDINGS: Gallbladder: No gallstones or wall thickening visualized. No
sonographic Murphy sign noted by sonographer.

Common bile duct: Diameter: 3.3 mm

Liver: No focal lesion identified. Mild lobular contour of the liver
is identified. There is diffuse increased echotexture the liver.
Portal vein is patent on color Doppler imaging with normal direction
of blood flow towards the liver.

IVC: No abnormality visualized.

Pancreas: Limited visualization due to overlying bowel gas.

Spleen: Size and appearance within normal limits.

Right Kidney: Length: 12.7 cm. Echogenicity within normal limits. No
mass or hydronephrosis visualized.

Left Kidney: Length: 12.9 cm. Echogenicity within normal limits. No
mass or hydronephrosis visualized.

Abdominal aorta: No aneurysm visualized.

Other findings: There is question trace ascites in the right upper
quadrant.
IMPRESSION: Findings in the liver consistent with cirrhosis of liver. No focal
liver lesion is identified.

Questioned trace ascites.

No acute abnormality.

## 2022-01-03 MED ORDER — PFIZER COVID-19 VAC BIVALENT 30 MCG/0.3ML IM SUSP
INTRAMUSCULAR | 0 refills | Status: DC
  Filled 2022-01-03: qty 0.3, 1d supply, fill #0

## 2022-01-20 DIAGNOSIS — G4733 Obstructive sleep apnea (adult) (pediatric): Secondary | ICD-10-CM | POA: Diagnosis not present

## 2022-01-31 ENCOUNTER — Other Ambulatory Visit: Payer: Self-pay | Admitting: Cardiology

## 2022-02-15 ENCOUNTER — Encounter (HOSPITAL_COMMUNITY): Payer: Self-pay

## 2022-02-15 ENCOUNTER — Emergency Department (HOSPITAL_COMMUNITY): Payer: 59

## 2022-02-15 ENCOUNTER — Inpatient Hospital Stay (HOSPITAL_COMMUNITY)
Admission: EM | Admit: 2022-02-15 | Discharge: 2022-02-27 | DRG: 226 | Disposition: A | Discharge status: Home / Self Care | Payer: 59 | Attending: Internal Medicine | Admitting: Internal Medicine

## 2022-02-15 ENCOUNTER — Other Ambulatory Visit: Payer: Self-pay

## 2022-02-15 DIAGNOSIS — G4733 Obstructive sleep apnea (adult) (pediatric): Secondary | ICD-10-CM | POA: Diagnosis present

## 2022-02-15 DIAGNOSIS — N179 Acute kidney failure, unspecified: Secondary | ICD-10-CM | POA: Diagnosis not present

## 2022-02-15 DIAGNOSIS — E66812 Obesity, class 2: Secondary | ICD-10-CM | POA: Diagnosis present

## 2022-02-15 DIAGNOSIS — I4821 Permanent atrial fibrillation: Secondary | ICD-10-CM | POA: Diagnosis not present

## 2022-02-15 DIAGNOSIS — Z79899 Other long term (current) drug therapy: Secondary | ICD-10-CM | POA: Diagnosis not present

## 2022-02-15 DIAGNOSIS — I440 Atrioventricular block, first degree: Secondary | ICD-10-CM | POA: Diagnosis present

## 2022-02-15 DIAGNOSIS — Z6841 Body Mass Index (BMI) 40.0 and over, adult: Secondary | ICD-10-CM

## 2022-02-15 DIAGNOSIS — N1831 Chronic kidney disease, stage 3a: Secondary | ICD-10-CM | POA: Diagnosis not present

## 2022-02-15 DIAGNOSIS — R1110 Vomiting, unspecified: Secondary | ICD-10-CM | POA: Diagnosis not present

## 2022-02-15 DIAGNOSIS — Z743 Need for continuous supervision: Secondary | ICD-10-CM | POA: Diagnosis not present

## 2022-02-15 DIAGNOSIS — M109 Gout, unspecified: Secondary | ICD-10-CM | POA: Diagnosis present

## 2022-02-15 DIAGNOSIS — R55 Syncope and collapse: Principal | ICD-10-CM | POA: Diagnosis present

## 2022-02-15 DIAGNOSIS — R569 Unspecified convulsions: Secondary | ICD-10-CM | POA: Diagnosis not present

## 2022-02-15 DIAGNOSIS — I5043 Acute on chronic combined systolic (congestive) and diastolic (congestive) heart failure: Secondary | ICD-10-CM | POA: Diagnosis not present

## 2022-02-15 DIAGNOSIS — I428 Other cardiomyopathies: Secondary | ICD-10-CM | POA: Diagnosis present

## 2022-02-15 DIAGNOSIS — I517 Cardiomegaly: Secondary | ICD-10-CM | POA: Diagnosis not present

## 2022-02-15 DIAGNOSIS — I495 Sick sinus syndrome: Secondary | ICD-10-CM | POA: Diagnosis present

## 2022-02-15 DIAGNOSIS — I130 Hypertensive heart and chronic kidney disease with heart failure and stage 1 through stage 4 chronic kidney disease, or unspecified chronic kidney disease: Secondary | ICD-10-CM | POA: Diagnosis not present

## 2022-02-15 DIAGNOSIS — Z7901 Long term (current) use of anticoagulants: Secondary | ICD-10-CM

## 2022-02-15 DIAGNOSIS — E876 Hypokalemia: Secondary | ICD-10-CM | POA: Diagnosis not present

## 2022-02-15 DIAGNOSIS — I5023 Acute on chronic systolic (congestive) heart failure: Secondary | ICD-10-CM | POA: Diagnosis not present

## 2022-02-15 DIAGNOSIS — I509 Heart failure, unspecified: Secondary | ICD-10-CM | POA: Diagnosis not present

## 2022-02-15 DIAGNOSIS — I4891 Unspecified atrial fibrillation: Secondary | ICD-10-CM | POA: Diagnosis present

## 2022-02-15 DIAGNOSIS — I4892 Unspecified atrial flutter: Secondary | ICD-10-CM | POA: Diagnosis not present

## 2022-02-15 DIAGNOSIS — E871 Hypo-osmolality and hyponatremia: Secondary | ICD-10-CM | POA: Diagnosis not present

## 2022-02-15 DIAGNOSIS — R000 Tachycardia, unspecified: Secondary | ICD-10-CM | POA: Diagnosis not present

## 2022-02-15 DIAGNOSIS — R0689 Other abnormalities of breathing: Secondary | ICD-10-CM | POA: Diagnosis not present

## 2022-02-15 DIAGNOSIS — I480 Paroxysmal atrial fibrillation: Secondary | ICD-10-CM | POA: Diagnosis present

## 2022-02-15 DIAGNOSIS — Z20822 Contact with and (suspected) exposure to covid-19: Secondary | ICD-10-CM | POA: Diagnosis present

## 2022-02-15 DIAGNOSIS — T502X5A Adverse effect of carbonic-anhydrase inhibitors, benzothiadiazides and other diuretics, initial encounter: Secondary | ICD-10-CM | POA: Diagnosis not present

## 2022-02-15 DIAGNOSIS — Z888 Allergy status to other drugs, medicaments and biological substances status: Secondary | ICD-10-CM

## 2022-02-15 DIAGNOSIS — Z8249 Family history of ischemic heart disease and other diseases of the circulatory system: Secondary | ICD-10-CM | POA: Diagnosis not present

## 2022-02-15 DIAGNOSIS — I4819 Other persistent atrial fibrillation: Secondary | ICD-10-CM | POA: Diagnosis not present

## 2022-02-15 DIAGNOSIS — I472 Ventricular tachycardia, unspecified: Principal | ICD-10-CM | POA: Diagnosis present

## 2022-02-15 DIAGNOSIS — I959 Hypotension, unspecified: Secondary | ICD-10-CM | POA: Diagnosis present

## 2022-02-15 DIAGNOSIS — I482 Chronic atrial fibrillation, unspecified: Secondary | ICD-10-CM | POA: Diagnosis present

## 2022-02-15 DIAGNOSIS — Z95810 Presence of automatic (implantable) cardiac defibrillator: Secondary | ICD-10-CM | POA: Diagnosis not present

## 2022-02-15 DIAGNOSIS — I272 Pulmonary hypertension, unspecified: Secondary | ICD-10-CM | POA: Diagnosis present

## 2022-02-15 DIAGNOSIS — I43 Cardiomyopathy in diseases classified elsewhere: Secondary | ICD-10-CM | POA: Diagnosis present

## 2022-02-15 DIAGNOSIS — Z7984 Long term (current) use of oral hypoglycemic drugs: Secondary | ICD-10-CM

## 2022-02-15 DIAGNOSIS — R29818 Other symptoms and signs involving the nervous system: Secondary | ICD-10-CM | POA: Diagnosis present

## 2022-02-15 DIAGNOSIS — I10 Essential (primary) hypertension: Secondary | ICD-10-CM | POA: Diagnosis not present

## 2022-02-15 DIAGNOSIS — I4729 Other ventricular tachycardia: Secondary | ICD-10-CM | POA: Diagnosis not present

## 2022-02-15 LAB — COMPREHENSIVE METABOLIC PANEL
ALT: 25 U/L (ref 0–44)
AST: 35 U/L (ref 15–41)
Albumin: 2 g/dL — ABNORMAL LOW (ref 3.5–5.0)
Alkaline Phosphatase: 114 U/L (ref 38–126)
Anion gap: 15 (ref 5–15)
BUN: 15 mg/dL (ref 6–20)
CO2: 19 mmol/L — ABNORMAL LOW (ref 22–32)
Calcium: 7.9 mg/dL — ABNORMAL LOW (ref 8.9–10.3)
Chloride: 106 mmol/L (ref 98–111)
Creatinine, Ser: 1.38 mg/dL — ABNORMAL HIGH (ref 0.61–1.24)
GFR, Estimated: >60 mL/min (ref >60)
Glucose, Bld: 168 mg/dL — ABNORMAL HIGH (ref 70–99)
Potassium: 3.8 mmol/L (ref 3.5–5.1)
Sodium: 140 mmol/L (ref 135–145)
Total Bilirubin: 2.6 mg/dL — ABNORMAL HIGH (ref 0.3–1.2)
Total Protein: 4.6 g/dL — ABNORMAL LOW (ref 6.5–8.1)

## 2022-02-15 LAB — RAPID URINE DRUG SCREEN, HOSP PERFORMED
Amphetamines: NOT DETECTED
Barbiturates: NOT DETECTED
Benzodiazepines: NOT DETECTED
Cocaine: NOT DETECTED
Opiates: NOT DETECTED
Tetrahydrocannabinol: NOT DETECTED

## 2022-02-15 LAB — RESP PANEL BY RT-PCR (FLU A&B, COVID) ARPGX2
Influenza A by PCR: NEGATIVE
Influenza B by PCR: NEGATIVE
SARS Coronavirus 2 by RT PCR: NEGATIVE

## 2022-02-15 LAB — I-STAT CHEM 8, ED
BUN: 17 mg/dL (ref 6–20)
Calcium, Ion: 0.8 mmol/L — CL (ref 1.15–1.40)
Chloride: 104 mmol/L (ref 98–111)
Creatinine, Ser: 1.3 mg/dL — ABNORMAL HIGH (ref 0.61–1.24)
Glucose, Bld: 166 mg/dL — ABNORMAL HIGH (ref 70–99)
HCT: 54 % — ABNORMAL HIGH (ref 39.0–52.0)
Hemoglobin: 18.4 g/dL — ABNORMAL HIGH (ref 13.0–17.0)
Potassium: 3.9 mmol/L (ref 3.5–5.1)
Sodium: 139 mmol/L (ref 135–145)
TCO2: 24 mmol/L (ref 22–32)

## 2022-02-15 LAB — CBC WITH DIFFERENTIAL/PLATELET
Abs Immature Granulocytes: 0.05 10*3/uL (ref 0.00–0.07)
Basophils Absolute: 0.1 10*3/uL (ref 0.0–0.1)
Basophils Relative: 1 %
Eosinophils Absolute: 0.1 10*3/uL (ref 0.0–0.5)
Eosinophils Relative: 2 %
HCT: 54.4 % — ABNORMAL HIGH (ref 39.0–52.0)
Hemoglobin: 17 g/dL (ref 13.0–17.0)
Immature Granulocytes: 1 %
Lymphocytes Relative: 12 %
Lymphs Abs: 0.8 10*3/uL (ref 0.7–4.0)
MCH: 29.1 pg (ref 26.0–34.0)
MCHC: 31.3 g/dL (ref 30.0–36.0)
MCV: 93 fL (ref 80.0–100.0)
Monocytes Absolute: 0.8 10*3/uL (ref 0.1–1.0)
Monocytes Relative: 11 %
Neutro Abs: 4.9 10*3/uL (ref 1.7–7.7)
Neutrophils Relative %: 73 %
Platelets: 287 10*3/uL (ref 150–400)
RBC: 5.85 MIL/uL — ABNORMAL HIGH (ref 4.22–5.81)
RDW: 13.5 % (ref 11.5–15.5)
WBC: 6.7 10*3/uL (ref 4.0–10.5)
nRBC: 0 % (ref 0.0–0.2)

## 2022-02-15 LAB — TROPONIN I (HIGH SENSITIVITY)
Troponin I (High Sensitivity): 24 ng/L — ABNORMAL HIGH (ref <18)
Troponin I (High Sensitivity): 225 ng/L (ref <18)

## 2022-02-15 LAB — BASIC METABOLIC PANEL
Anion gap: 9 (ref 5–15)
BUN: 16 mg/dL (ref 6–20)
CO2: 26 mmol/L (ref 22–32)
Calcium: 8.5 mg/dL — ABNORMAL LOW (ref 8.9–10.3)
Chloride: 107 mmol/L (ref 98–111)
Creatinine, Ser: 1.4 mg/dL — ABNORMAL HIGH (ref 0.61–1.24)
GFR, Estimated: >60 mL/min (ref >60)
Glucose, Bld: 81 mg/dL (ref 70–99)
Potassium: 3.9 mmol/L (ref 3.5–5.1)
Sodium: 142 mmol/L (ref 135–145)

## 2022-02-15 LAB — BRAIN NATRIURETIC PEPTIDE: B Natriuretic Peptide: 202.1 pg/mL — ABNORMAL HIGH (ref 0.0–100.0)

## 2022-02-15 LAB — LACTIC ACID, PLASMA
Lactic Acid, Venous: 3.4 mmol/L (ref 0.5–1.9)
Lactic Acid, Venous: 1.4 mmol/L (ref 0.5–1.9)

## 2022-02-15 LAB — MAGNESIUM
Magnesium: 1.8 mg/dL (ref 1.7–2.4)
Magnesium: 1.9 mg/dL (ref 1.7–2.4)

## 2022-02-15 LAB — CBG MONITORING, ED: Glucose-Capillary: 152 mg/dL — ABNORMAL HIGH (ref 70–99)

## 2022-02-15 IMAGING — CT CT HEAD W/O CM
4 series · 16 of 47 positions shown, 18 images · non-contrast
Comparison: None.

CLINICAL DATA: New onset seizure.  No history of trauma.



[Series 2: head without · axial · non-contrast · 0.45mm/px · z∈[-52,+68]mm · 7 of 32 slices shown, 9 images]
[im 4/32  brain]
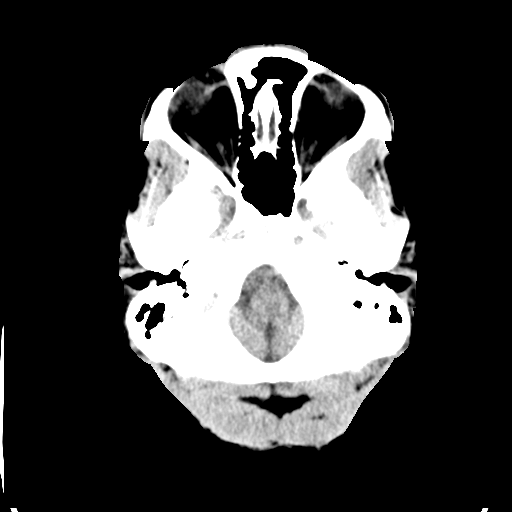
[im 4/32  bone]
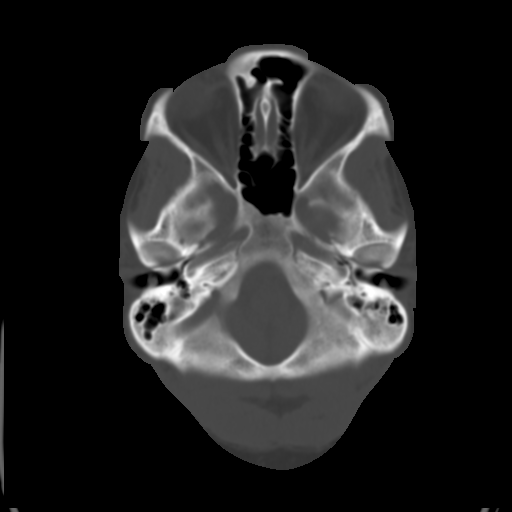
[im 8/32  brain]
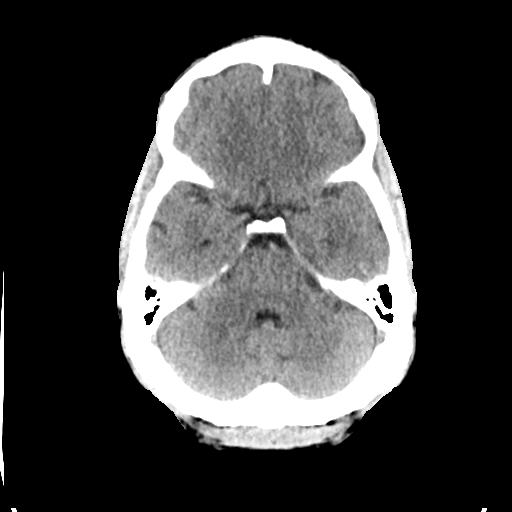
[im 12/32  brain]
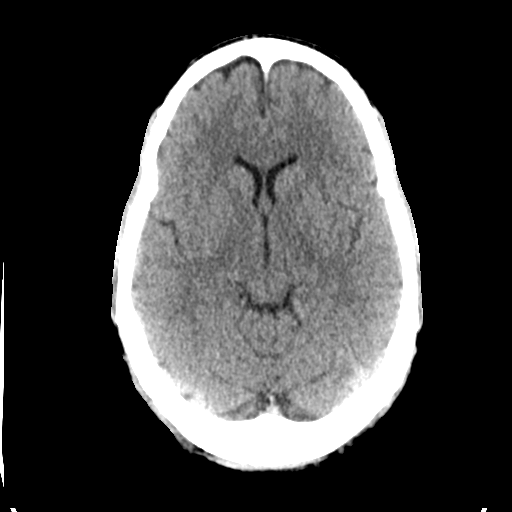
[im 16/32  brain]
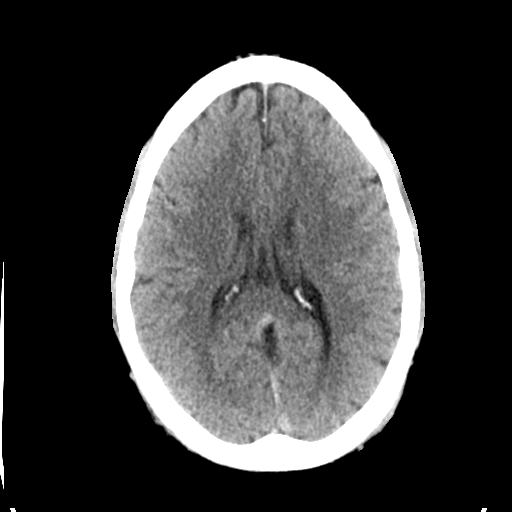
[im 20/32  brain]
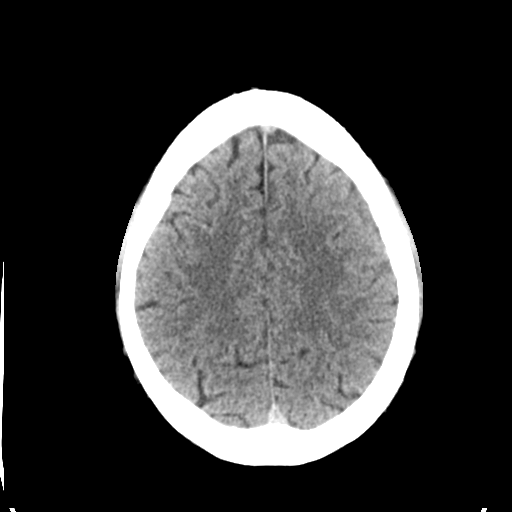
[im 20/32  bone]
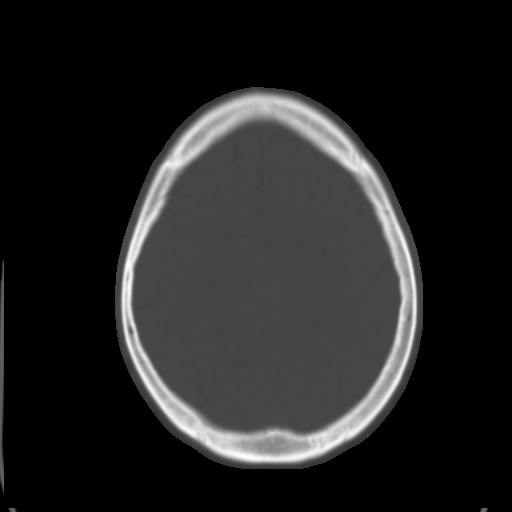
[im 24/32  brain]
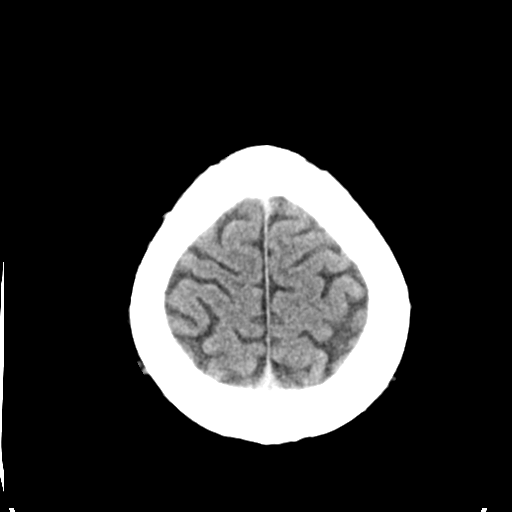
[im 28/32  brain]
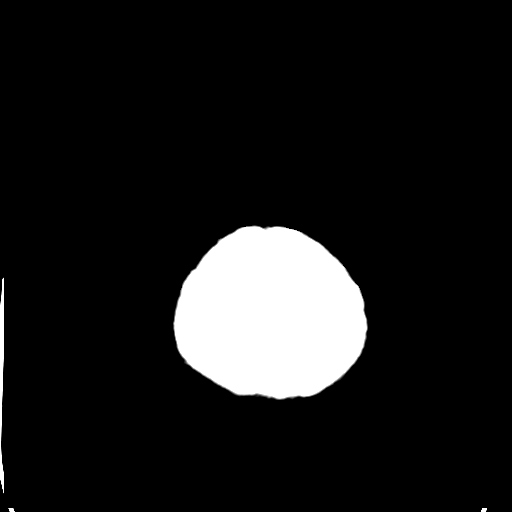

[Series 3: head bone · axial · 0.45mm/px · z∈[-53,-21]mm · 3 of 79 slices shown]
[im 8/79  bone]
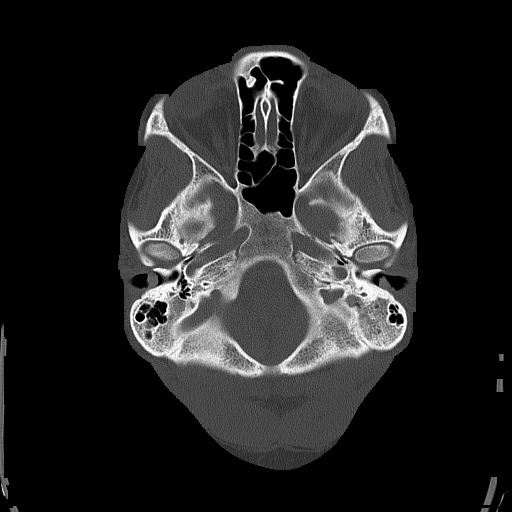
[im 16/79  bone]
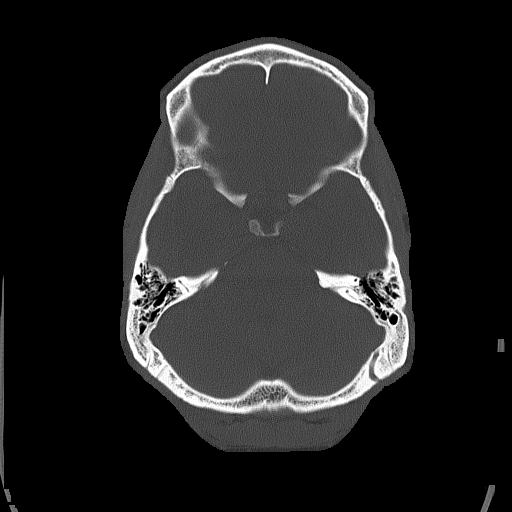
[im 24/79  bone]
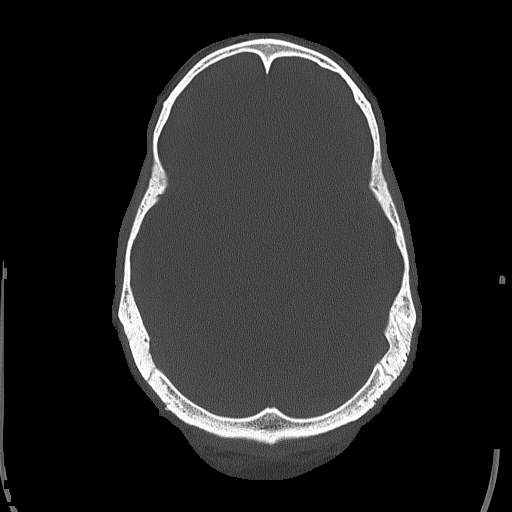

[Series 4: head without cor · coronal · non-contrast · 0.30mm/px · 3 of 75 slices shown]
[im 25/75  brain]
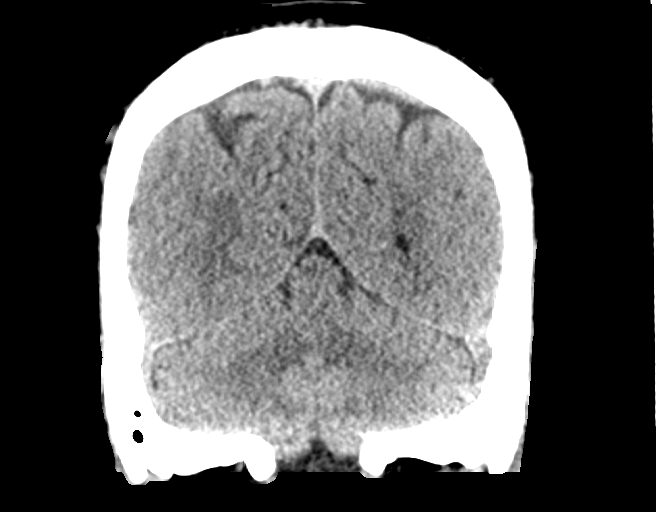
[im 33/75  brain]
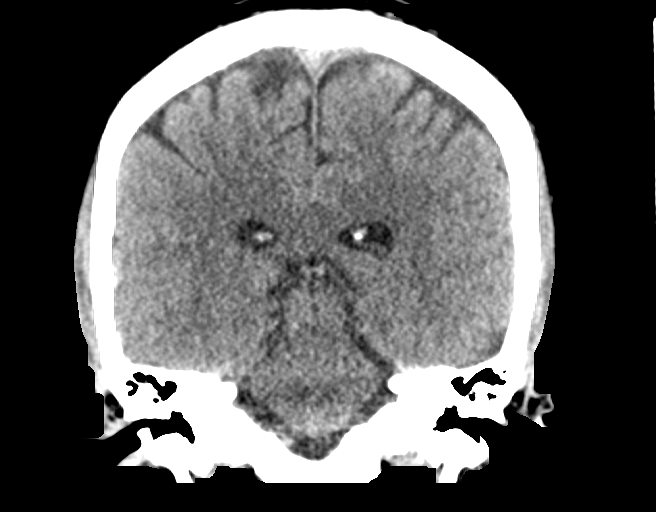
[im 42/75  brain]
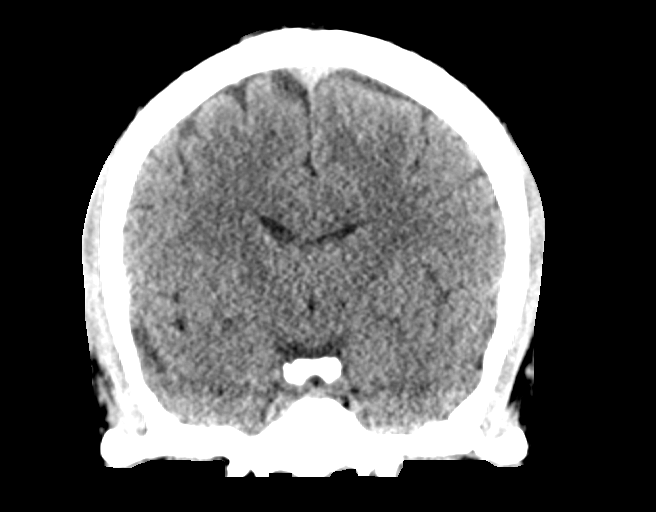

[Series 5: head without sag · sagittal · non-contrast · 0.34mm/px · 3 of 67 slices shown]
[im 23/67  brain]
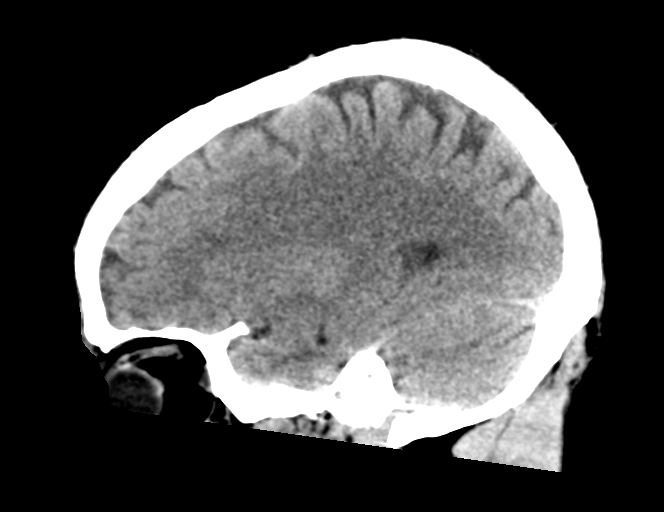
[im 34/67  brain]
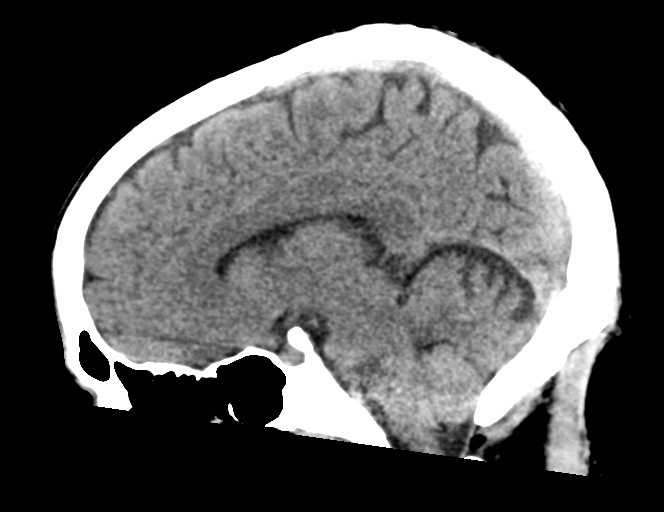
[im 45/67  brain]
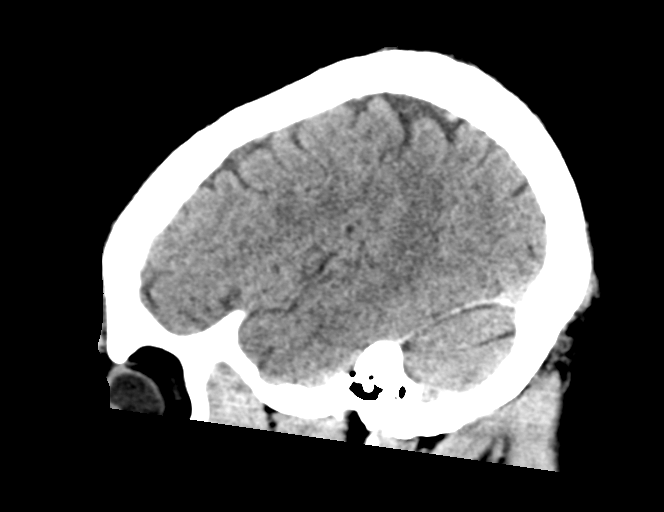

[16 of 47 positions shown; findings below may reference images not displayed]

FINDINGS: Brain: The ventricles are normal in size and configuration. No
extra-axial fluid collections are identified. The gray-white
differentiation is maintained. No CT findings for acute hemispheric
infarction or intracranial hemorrhage. No mass lesions. The
brainstem and cerebellum are normal.

Vascular: No hyperdense vessels or obvious aneurysm.

Skull: No acute skull fracture.  No bone lesion.

Sinuses/Orbits: The paranasal sinuses and mastoid air cells are
clear. The globes are intact.

Other: No scalp lesions, laceration or hematoma.
IMPRESSION: Normal head CT.

## 2022-02-15 IMAGING — DX DG CHEST 1V PORT
1 series · 1 of 1 positions shown · non-contrast
Comparison: CT [DATE].  Radiographs [DATE] and [DATE].

CLINICAL DATA: Syncope, ventricular tachycardia, congestive heart
failure.

EXAM:
PORTABLE CHEST 1 VIEW

[chest ap]
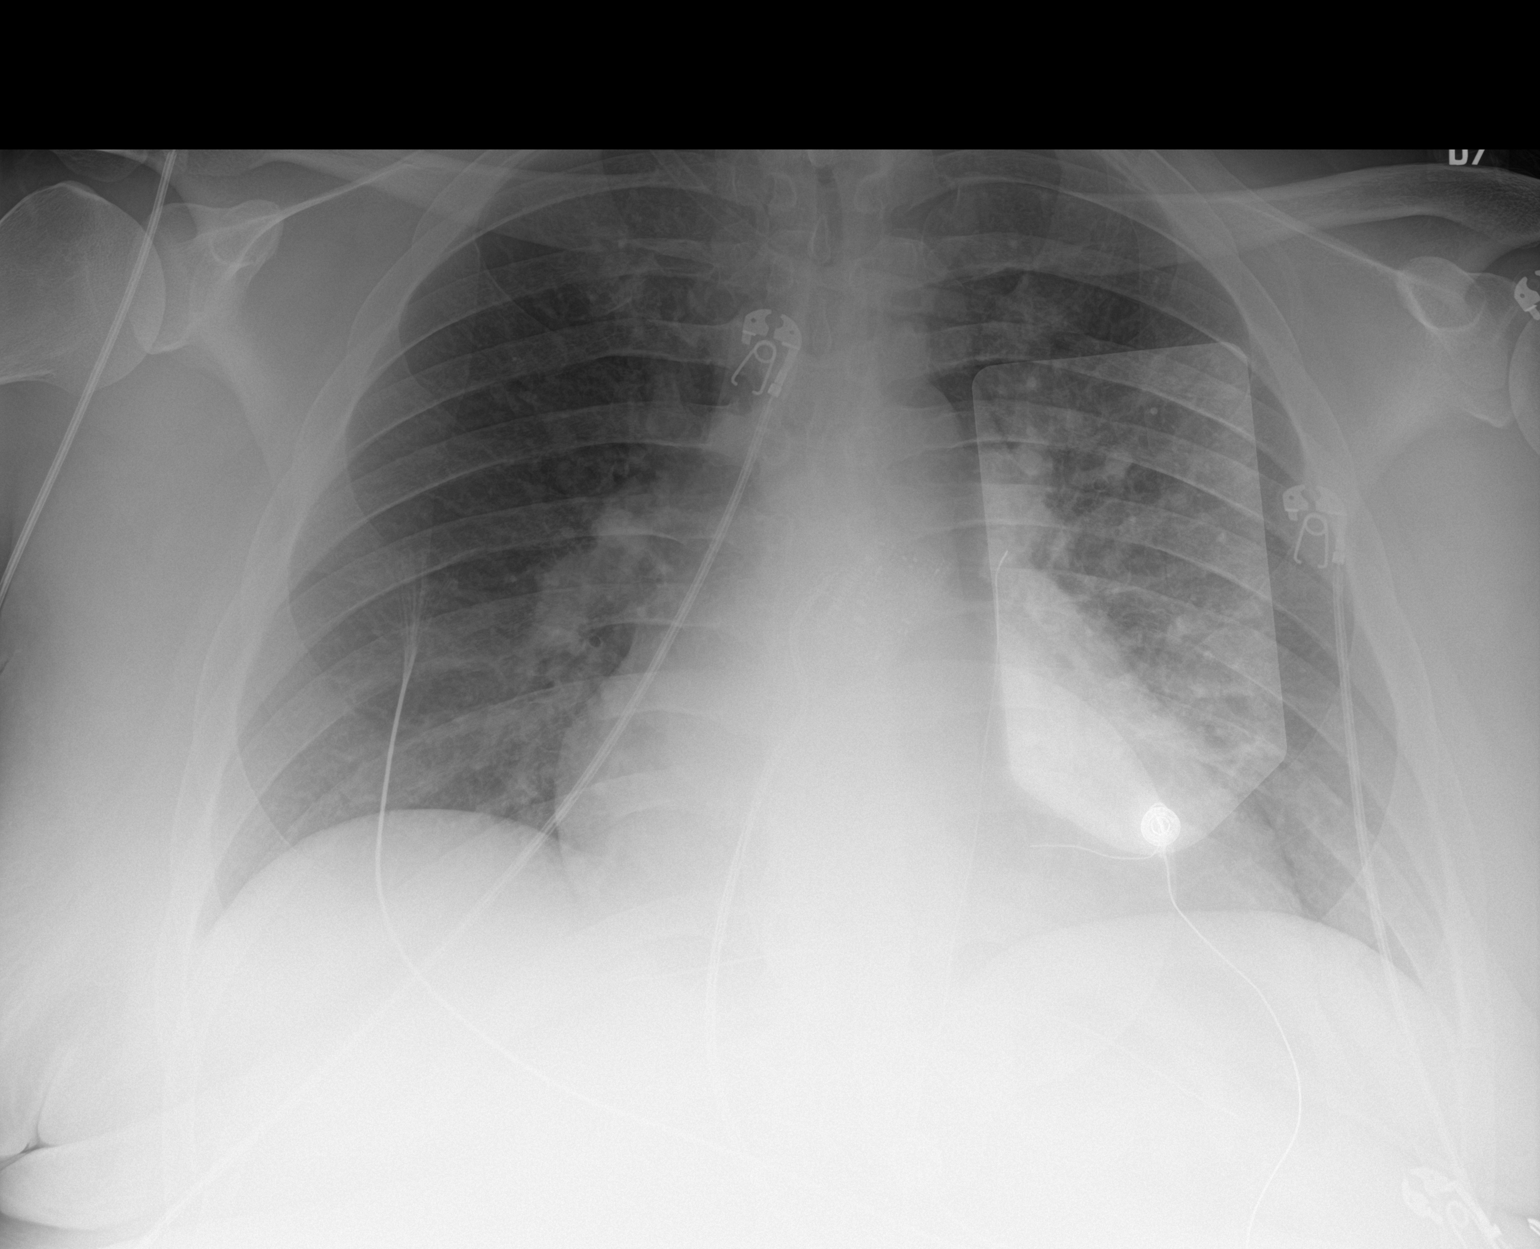

[1 of 1 positions shown; findings below may reference images not displayed]

FINDINGS: [[place]] hours. Suboptimal inspiration. The heart size is at the upper
limits of normal for portable technique. The pulmonary vascularity
appears normal. No evidence of edema, confluent airspace opacity,
pleural effusion or pneumothorax. The bones appear unremarkable.
Telemetry leads and external pacer overlie the chest.
IMPRESSION: No evidence of active cardiopulmonary process.

## 2022-02-15 MED ORDER — ONDANSETRON HCL 4 MG/2ML IJ SOLN
4.0000 mg | Freq: Four times a day (QID) | INTRAMUSCULAR | Status: DC | PRN
  Administered 2022-02-17: 4 mg via INTRAVENOUS
  Filled 2022-02-15: qty 2

## 2022-02-15 MED ORDER — MAGNESIUM SULFATE 2 GM/50ML IV SOLN
2.0000 g | Freq: Once | INTRAVENOUS | Status: AC
Start: 2022-02-15 — End: 2022-02-15
  Administered 2022-02-15: 2 g via INTRAVENOUS
  Filled 2022-02-15: qty 50

## 2022-02-15 MED ORDER — SODIUM CHLORIDE 0.9 % IV SOLN: 250.0000 mL | INTRAVENOUS | Status: DC | PRN

## 2022-02-15 MED ORDER — ACETAMINOPHEN 325 MG PO TABS
650.0000 mg | ORAL_TABLET | ORAL | Status: DC | PRN
  Administered 2022-02-22 – 2022-02-25 (×11): 650 mg via ORAL
  Filled 2022-02-15 (×12): qty 2

## 2022-02-15 MED ORDER — CALCIUM GLUCONATE-NACL 1-0.675 GM/50ML-% IV SOLN
1.0000 g | Freq: Once | INTRAVENOUS | Status: AC
  Administered 2022-02-15: 1000 mg via INTRAVENOUS
  Filled 2022-02-15: qty 50

## 2022-02-15 MED ORDER — PANTOPRAZOLE SODIUM 40 MG PO TBEC
40.0000 mg | DELAYED_RELEASE_TABLET | Freq: Every day | ORAL | Status: DC
  Administered 2022-02-15 – 2022-02-27 (×13): 40 mg via ORAL
  Filled 2022-02-15 (×13): qty 1

## 2022-02-15 MED ORDER — ALLOPURINOL 100 MG PO TABS
200.0000 mg | ORAL_TABLET | Freq: Every day | ORAL | Status: DC
  Administered 2022-02-15 – 2022-02-27 (×13): 200 mg via ORAL
  Filled 2022-02-15 (×13): qty 2

## 2022-02-15 MED ORDER — POTASSIUM CHLORIDE CRYS ER 10 MEQ PO TBCR
40.0000 meq | EXTENDED_RELEASE_TABLET | Freq: Every day | ORAL | Status: DC
  Administered 2022-02-15 – 2022-02-17 (×3): 40 meq via ORAL
  Filled 2022-02-15 (×5): qty 4

## 2022-02-15 MED ORDER — HEPARIN (PORCINE) 25000 UT/250ML-% IV SOLN
1400.0000 [IU]/h | INTRAVENOUS | Status: DC
  Administered 2022-02-15: 1150 [IU]/h via INTRAVENOUS
  Administered 2022-02-17: 1400 [IU]/h via INTRAVENOUS
  Filled 2022-02-15 (×3): qty 250

## 2022-02-15 MED ORDER — SODIUM CHLORIDE 0.9% FLUSH: 3.0000 mL | INTRAVENOUS | Status: DC | PRN

## 2022-02-15 MED ORDER — MEXILETINE HCL 150 MG PO CAPS
150.0000 mg | ORAL_CAPSULE | Freq: Two times a day (BID) | ORAL | Status: DC
  Administered 2022-02-15 – 2022-02-27 (×25): 150 mg via ORAL
  Filled 2022-02-15 (×26): qty 1

## 2022-02-15 MED ORDER — SODIUM CHLORIDE 0.9% FLUSH
3.0000 mL | Freq: Two times a day (BID) | INTRAVENOUS | Status: DC
  Administered 2022-02-15 – 2022-02-27 (×13): 3 mL via INTRAVENOUS

## 2022-02-15 MED ORDER — FUROSEMIDE 10 MG/ML IJ SOLN
80.0000 mg | Freq: Two times a day (BID) | INTRAMUSCULAR | Status: DC
  Administered 2022-02-15 – 2022-02-17 (×5): 80 mg via INTRAVENOUS
  Filled 2022-02-15 (×5): qty 8

## 2022-02-15 NOTE — ED Notes (Signed)
Pt states when he usually takes Lasix he gets gout. He wants to make sure it is controlled. Pt received Allopurinol.  ?

## 2022-02-15 NOTE — ED Notes (Signed)
Provided pt 280 ml of water. ?

## 2022-02-15 NOTE — ED Notes (Signed)
Assisted pt at bedside to use urinal. Pt was steady on his feet and did not need assistance. ?

## 2022-02-15 NOTE — H&P (Addendum)
? ?Advanced Heart Failure Team History and Physical Note ?  ?PCP:  Seward Carol, MD  ?HF-Cardiology: Dr Aundra Dubin   ? ?Reason for Admission: syncope WCT and A/C HFrEF  ? ? ?HPI:   ? ?Bradley Powell is a 41 year old with a history of combined systolic/diastolic heart failure, He underwent atrial flutter ablation on 04/06/18. Subsequently had marked 1st AV block with progression to junctional rhythm. esity, HTN, A flutter, A flutter ablation, and gout.  ? ?Back in 2021 concern for amyloid versus sarcoid. Genetic testing [id number] "uncertain variant for HCM. Negative for TTR" .Over the last year he had PYP, MRI, and genetic testing. CMRI suggestive amyloid. PYP equivocal. Cardiac Pet ordered but he has not obtained.  ? ?Over the last few months weight has been trending up. He has been taking torsemide 40 mg or 60 mg daily. SOB with exertion. 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  ? ?Collapsed /syncope while at [employer] . EMS called. WCT . ? Possible seizure.  Given 20 mg IV cardizem followed by 10 mg . Developed bradycardia and required pacing. On arrival rate was in the 50s. Pacing pads removed and remained bradycardic. Lactic acid 3.4. Mag 1.8, HS Trop 24.  CT head normal.  ? ?Cardiac Studies  ?cMRI 09/2021  ?1. While ECV is markedly elevated (43%), which is in the amyloid ?range, LV morphology does not suggest amyloid as wall thickness is ?normal. Rather, LGE pattern suggests cardiac sarcoidosis. Suspect ?marked ECV elevation is due to diffuse LGE in setting of ?sarcoidosis. Recommend CT chest to evaluate for pulmonary sarcoid ?and cardiac PET to evaluate for active inflammation  ?2. Diffuse LGE, most notably midwall basal septum extending into RV, ?subepicardial in inferior wall, midwall in lateral wall, and ?circumferential at apex. Concerning for sarcoidosis as above  ?3. Regional elevation in T2 values (up to 11ms in apical septum) ?suggestive of myocardial inflammation, as can be seen in sarcoid  ?4.  Normal LV  size with mild systolic dysfunction (EF 0000000)  ?5.  Mild RV dilatation with mild systolic dysfunction (EF [id number])  ?6.  Small pericardial effusion ?  ?cMRI (7/21): ?1. Findings suggestive of infiltrative cardiomyopathy. Native T1 ECV ?55%, with diffuse post contrast delayed myocardial enhancement and ?abnormal T1 myocardial nulling kinetics, consistent with a diagnosis ?of cardiac amyloidosis. ?2.  Upper limit of normal biventricular chamber size. ?3. Mildly reduced biventricular systolic function. LVEF 45%, RVEF ?44%. ?4. Cannot exclude possible anomalous pulmonary venous drainage in to ?the mid SVC. Seen on axial HASTE image series 3 image 4, but ?evaluation limited due to poor spatial resolution. Consider ECG ?gated CTA chest for further evaluation. ?  ?- Echo 2/21: EF 40-45%  ?- Echo 1/20: EF 35-40% ?- Echo 9/18: EF ~25-30%.  ?- Echo 3/19: EF 45-50% ?  ?- Holter 7/18: 6% PVCs ?- Sleep study (10/18): AHI 26/hr ?  ?- RHC (2/21): ?RA = 14 ?RV = 43/15 ?PA = 43/21 (31) ?PCW = 15 ?Fick cardiac output/index = 5.7/2.4 ?PVR = 2.8 WU ?Ao sat = 97% ?PA sat = 73%, 75% ?SVC sat = 67% ?  ?R/LHC (4/18): ?AO = 109/86 (97) ?LV =  101/28 ?RA =  29 ?RV = 50/22 ?PA = 55/24 (38) ?PCW = 26 ?Fick cardiac output/index = 6.4/2.6 ?Thermal CO/CI = 4.9/2.0 ?PVR = 2.5 WU ?FA sat = 98% ?PA sat = 69%, 71% ?SVC sat = 82%, 82% ?IVC sat = 66%   ?Assessment: ?1. Normal coronary arteries  ?2. Mild to moderate pulmonary  HTN with normal PVR ?3. Biventricular volume overload with R > L heart failure ?4. Moderately decreased CO by thermodilution  ?5. 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 No intra-cardiac shuntcMRI 7/21 EF 45% suspect infiltrative CM. ECV 55% ?  ? ?Review of Systems: [y] = yes, [ ]  = no  ? ?General: Weight gain [ ] ; Weight loss [ ] ; Anorexia [ ] ; Fatigue [Y ]; Fever [ ] ; Chills [ ] ; Weakness [ Y]  ?Cardiac: Chest pain/pressure [ ] ; Resting SOB [ ] ;  Exertional SOB [ Y]; Orthopnea [ Y]; Pedal Edema [ Y]; Palpitations [ ] ; Syncope [Y ]; Presyncope [ ] ; Paroxysmal nocturnal dyspnea[ ]   ?Pulmonary: Cough [ ] ; Wheezing[ ] ; Hemoptysis[ ] ; Sputum [ ] ; Snoring [ ]   ?GI: Vomiting[ ] ; Dysphagia[ ] ; Melena[ ] ; Hematochezia [ ] ; Heartburn[ ] ; Abdominal pain [ ] ; Constipation [ ] ; Diarrhea [ ] ; BRBPR [ ]   ?GU: Hematuria[ ] ; Dysuria [ ] ; Nocturia[ ]   ?Vascular: Pain in legs with walking [ ] ; Pain in feet with lying flat [ ] ; Non-healing sores [ ] ; Stroke [ ] ; TIA [ ] ; Slurred speech [ ] ;  ?Neuro: Headaches[ ] ; Vertigo[ ] ; Seizures[ ] ; Paresthesias[ ] ;Blurred vision [ ] ; Diplopia [ ] ; Vision changes [ ]   ?Ortho/Skin: Arthritis [ ] ; Joint pain [ Y]; Muscle pain [ ] ; Joint swelling [ ] ; Back Pain [ Y]; Rash [ ]   ?Psych: Depression[ ] ; Anxiety[ ]   ?Heme: Bleeding problems [ ] ; Clotting disorders [ ] ; Anemia [ ]   ?Endocrine: Diabetes [ ] ; Thyroid dysfunction[ ]  ? ? ?Home Medications ?Prior to Admission medications   ?Medication Sig Start Date End Date Taking? Authorizing Provider  ?allopurinol (ZYLOPRIM) 100 MG tablet Take 2 tablets (200 mg total) by mouth daily. Please contact PCP for further refills 04/10/19   Seva Chancy, Shaune Pascal, MD  ?carvedilol (COREG) 3.125 MG tablet Take 1 tablet (3.125 mg total) by mouth 2 (two) times daily with a meal. 06/30/21   Milford, Maricela Bo, FNP  ?ELIQUIS 5 MG TABS tablet TAKE 1 TABLET BY MOUTH TWICE A DAY ?Patient taking differently: Take 5 mg by mouth 2 (two) times daily. 07/21/21   Merlie Noga, Shaune Pascal, MD  ?esomeprazole (NEXIUM) 40 MG capsule Take 40 mg by mouth 2 (two) times daily. 01/08/22   [provider]  ?JARDIANCE 10 MG TABS tablet TAKE 1 TABLET BY MOUTH EVERY DAY BEFORE BREAKFAST. ?Patient taking differently: Take 10 mg by mouth daily. 02/11/21   Rowen Wilmer, Shaune Pascal, MD  ?MITIGARE 0.6 MG CAPS Take 0.6 mg by mouth daily as needed (gout flare). 04/10/19   Charvez Voorhies, Shaune Pascal, MD  ?ondansetron (ZOFRAN ODT) 4 MG disintegrating tablet Take  1 tablet (4 mg total) by mouth every 8 (eight) hours as needed for nausea or vomiting. 06/21/20   Spongberg, Audie Pinto, MD  ?pantoprazole (PROTONIX) 40 MG tablet Take 40 mg by mouth daily.    [provider]  ?potassium chloride SA (KLOR-CON M20) 20 MEQ tablet Take 2 tablets (40 mEq total) by mouth every morning AND 1 tablet (20 mEq total) every evening. 07/09/21   Milford, Maricela Bo, FNP  ?promethazine (PROMETHEGAN) 12.5 MG suppository Place 1 suppository (12.5 mg total) rectally every 6 (six) hours as needed for nausea or vomiting. 06/21/20   Marcell Anger, MD  ?spironolactone (ALDACTONE) 25 MG tablet TAKE 1 TABLET BY MOUTH EVERYDAY AT BEDTIME ?Patient taking differently: Take 25 mg by mouth at  bedtime. 04/29/21   Evagelia Knack, Shaune Pascal, MD  ?sucralfate (CARAFATE) 1 g tablet Take 1 g by mouth daily.  09/03/20   [provider]  ?torsemide (DEMADEX) 20 MG tablet Take 2 tablets (40 mg total) by mouth daily. NEEDS FOLLOW UP APPOINTMENT FOR ANYMORE REFILLS ?Patient taking differently: Take 40 mg by mouth daily. 01/31/22   Larey Dresser, MD  ? ? ?Past Medical History: ?Past Medical History:  ?Diagnosis Date  ? Atrial flutter ([place])   ? a. s/p ablation 03/2018.  ? Chronic combined systolic and diastolic CHF (congestive heart failure) ([place])   ? History of esophagogastroduodenoscopy (EGD)   ? Morbid obesity ([place])   ? NICM (nonischemic cardiomyopathy) ([place])   ? PVC's (premature ventricular contractions)   ? Sleep apnea   ? ? ?Past Surgical History: ?Past Surgical History:  ?Procedure Laterality Date  ? A-FLUTTER ABLATION N/A 04/06/2018  ? Procedure: A-FLUTTER ABLATION;  Surgeon: Evans Lance, MD;  Location: [facility];  Service: Cardiovascular;  Laterality: N/A;  ? ESOPHAGEAL BRUSHING  06/19/2020  ? Procedure: ESOPHAGEAL BRUSHING;  Surgeon: Wilford Corner, MD;  Location: [facility];  Service: Endoscopy;;  ? ESOPHAGOGASTRODUODENOSCOPY (EGD) WITH PROPOFOL Left 06/19/2020  ? Procedure:  ESOPHAGOGASTRODUODENOSCOPY (EGD) WITH PROPOFOL;  Surgeon: Wilford Corner, MD;  Location: [facility];  Service: Endoscopy;  Laterality: Left;  ? NO PAST SURGERIES    ? RIGHT HEART CATH N/A 12/20/2019  ? Procedu

## 2022-02-15 NOTE — ED Provider Notes (Addendum)
?[facility] EMERGENCY DEPARTMENT ?Provider Note ? ? ?CSN: [id number] ?Arrival date & time: 02/15/22  1003 ? ?  ? ?History ? ?Chief Complaint  ?Patient presents with  ? Bradycardia  ? ? ?Bradley Powell is a 41 y.o. male. ? ?HPI ? ?  ? ?41 year old male with a history of combined systolic/diastolic heart failure, obesity, hypertension, gout, history of atrial flutter on Eliquis status post ablation in 2019, who presents with concern for syncopal episode, found to have tachycardia treated with diltiazem total of 30 mg in route, and resulting bradycardia. ? ?He was sitting in a booth at [employer], and said "I do not feel well" and had a syncopal episode with shaking.  His friend called EMS with concern for seizure.  Fire department checked his sugar and found it to be in the 90s.  On EMS arrival they found his heart rate to be 200, and had read the EKG as atrial fibrillation, one of the EMS providers had discussed possible cardioversion with patient, however he declined.  EMS gave 20 mg of Cardizem.  When he did not respond to this, they gave 10 additional milligrams of Cardizem.  After he received this medication, he began to have nausea and vomiting and had bradycardia with a rate in the 20s.  They began to transcutaneously pace him with good capture at a rate of 60 and 120 mV. ? ?The patient is mentating well on arrival to the emergency department, being paced. Reports he believes he has had problems with fluid recently, increased swelling. Began to feel lightheaded just prior to syncopal episode.  Denies chest pain, shortness of breath.  Had nausea and vomiting with his episode of bradycardia, but otherwise has not had any nausea or vomiting.  Denies any black or bloody stools, fever, cough.  He reports he has been taking his medications, and felt that his swelling was getting worse and his heart failure was getting worse and felt like he would need to come into the hospital soon to receive  Lasix.  He is a patient of Dr. Prescott Gum.  ? ?Friend did come to bedside and later gave description of event-reports that Amante reported he was feeling lightheaded, and then began to become diaphoretic and had a brief syncopal episode.  He did not have full body shaking or stiffening. ? ?Past Medical History:  ?Diagnosis Date  ? Atrial flutter ([facility])   ? a. s/p ablation 03/2018.  ? Chronic combined systolic and diastolic CHF (congestive heart failure) ([facility])   ? History of esophagogastroduodenoscopy (EGD)   ? Morbid obesity ([facility])   ? NICM (nonischemic cardiomyopathy) ([facility])   ? PVC's (premature ventricular contractions)   ? Sleep apnea   ?  ? ?Home Medications ?Prior to Admission medications   ?Medication Sig Start Date End Date Taking? Authorizing Provider  ?allopurinol (ZYLOPRIM) 100 MG tablet Take 2 tablets (200 mg total) by mouth daily. Please contact PCP for further refills 04/10/19  Yes Bensimhon, Bevelyn Buckles, MD  ?carvedilol (COREG) 3.125 MG tablet Take 1 tablet (3.125 mg total) by mouth 2 (two) times daily with a meal. 06/30/21  Yes Milford, Anderson Malta, FNP  ?esomeprazole (NEXIUM) 40 MG capsule Take 40 mg by mouth 2 (two) times daily. 01/08/22  Yes [provider]  ?JARDIANCE 10 MG TABS tablet TAKE 1 TABLET BY MOUTH EVERY DAY BEFORE BREAKFAST. ?Patient taking differently: Take 10 mg by mouth daily. 02/11/21  Yes Bensimhon, Bevelyn Buckles, MD  ?MITIGARE 0.6 MG CAPS Take 0.6  mg by mouth daily as needed (gout flare). 04/10/19  Yes Bensimhon, Bevelyn Buckles, MD  ?potassium chloride SA (KLOR-CON M20) 20 MEQ tablet Take 2 tablets (40 mEq total) by mouth every morning AND 1 tablet (20 mEq total) every evening. ?Patient taking differently: 40 mEq in the morning, 20 mEq in the evening 07/09/21  Yes Tornillo, Anderson Malta, FNP  ?spironolactone (ALDACTONE) 25 MG tablet TAKE 1 TABLET BY MOUTH EVERYDAY AT BEDTIME ?Patient taking differently: Take 25 mg by mouth at bedtime. 04/29/21  Yes Bensimhon, Bevelyn Buckles, MD  ?sucralfate (CARAFATE) 1 g  tablet Take 1 g by mouth daily.  09/03/20  Yes [provider]  ?torsemide (DEMADEX) 20 MG tablet Take 2 tablets (40 mg total) by mouth daily. NEEDS FOLLOW UP APPOINTMENT FOR ANYMORE REFILLS ?Patient taking differently: Take 40 mg by mouth daily. 01/31/22  Yes Laurey Morale, MD  ?Everlene Balls 5 MG TABS tablet TAKE 1 TABLET BY MOUTH TWICE A DAY ?Patient taking differently: Take 5 mg by mouth 2 (two) times daily. 07/21/21   Bensimhon, Bevelyn Buckles, MD  ?promethazine (PROMETHEGAN) 12.5 MG suppository Place 1 suppository (12.5 mg total) rectally every 6 (six) hours as needed for nausea or vomiting. ?Patient not taking: Reported on 02/15/2022 06/21/20   Marzetta Board, MD  ?   ? ?Allergies    ?Entresto [sacubitril-valsartan]   ? ?Review of Systems   ?Review of Systems ?See above ? ?Physical Exam ?Updated Vital Signs ?BP 109/82   Pulse 74   Temp 97.9 ?F (36.6 ?C) (Oral)   Resp (!) 23   Ht 5\' 8"  (1.727 m)   Wt 117.9 kg   SpO2 100%   BMI 39.53 kg/m?  ?Physical Exam ?Vitals and nursing note reviewed.  ?Constitutional:   ?   General: He is not in acute distress. ?   Appearance: He is well-developed. He is ill-appearing and diaphoretic.  ?HENT:  ?   Head: Normocephalic and atraumatic.  ?Eyes:  ?   Conjunctiva/sclera: Conjunctivae normal.  ?Cardiovascular:  ?   Rate and Rhythm: Bradycardia present. Rhythm irregular.  ?   Heart sounds: Normal heart sounds. No murmur heard. ?  No friction rub. No gallop.  ?Pulmonary:  ?   Effort: Pulmonary effort is normal. No respiratory distress.  ?   Breath sounds: Normal breath sounds. No wheezing or rales.  ?Abdominal:  ?   General: There is no distension.  ?   Palpations: Abdomen is soft.  ?   Tenderness: There is no abdominal tenderness. There is no guarding.  ?Musculoskeletal:     ?   General: Swelling present.  ?   Cervical back: Normal range of motion.  ?   Right lower leg: Edema present.  ?   Left lower leg: Edema present.  ?Skin: ?   General: Skin is warm.  ?Neurological:   ?   Mental Status: He is alert and oriented to person, place, and time.  ? ? ?ED Results / Procedures / Treatments   ?Labs ?(all labs ordered are listed, but only abnormal results are displayed) ?Labs Reviewed  ?CBC WITH DIFFERENTIAL/PLATELET - Abnormal; Notable for the following components:  ?    Result Value  ? RBC 5.85 (*)   ? HCT 54.4 (*)   ? All other components within normal limits  ?COMPREHENSIVE METABOLIC PANEL - Abnormal; Notable for the following components:  ? CO2 19 (*)   ? Glucose, Bld 168 (*)   ? Creatinine, Ser 1.38 (*)   ? Calcium  7.9 (*)   ? Total Protein 4.6 (*)   ? Albumin 2.0 (*)   ? Total Bilirubin 2.6 (*)   ? All other components within normal limits  ?LACTIC ACID, PLASMA - Abnormal; Notable for the following components:  ? Lactic Acid, Venous 3.4 (*)   ? All other components within normal limits  ?BRAIN NATRIURETIC PEPTIDE - Abnormal; Notable for the following components:  ? B Natriuretic Peptide 202.1 (*)   ? All other components within normal limits  ?CBG MONITORING, ED - Abnormal; Notable for the following components:  ? Glucose-Capillary 152 (*)   ? All other components within normal limits  ?I-STAT CHEM 8, ED - Abnormal; Notable for the following components:  ? Creatinine, Ser 1.30 (*)   ? Glucose, Bld 166 (*)   ? Calcium, Ion 0.80 (*)   ? Hemoglobin 18.4 (*)   ? HCT 54.0 (*)   ? All other components within normal limits  ?TROPONIN I (HIGH SENSITIVITY) - Abnormal; Notable for the following components:  ? Troponin I (High Sensitivity) 24 (*)   ? All other components within normal limits  ?RESP PANEL BY RT-PCR (FLU A&B, COVID) [id number]  ?LACTIC ACID, PLASMA  ?MAGNESIUM  ?RAPID URINE DRUG SCREEN, HOSP PERFORMED  ?HEPARIN LEVEL (UNFRACTIONATED)  ?APTT  ?HIV ANTIBODY (ROUTINE TESTING W REFLEX)  ?TROPONIN I (HIGH SENSITIVITY)  ? ? ?EKG ?EKG Interpretation ? ?Date/Time:  Tuesday February 15 2022 10:29:41 EDT ?Ventricular Rate:  56 ?PR Interval:    ?QRS Duration: 109 ?QT Interval:  575 ?QTC  Calculation: 556 ?R Axis:   158 ?Text Interpretation: Atrial fibrillation Consider RVH w/ secondary repol abnormality Inferior infarct, age indeterminate Abnormal lateral Q waves Prolonged QT interval No significant cha

## 2022-02-15 NOTE — Consult Note (Addendum)
? ?ELECTROPHYSIOLOGY CONSULT NOTE  ? ? ?Patient ID: ARNOL COPPEDGE ?MRN: [id number], DOB/AGE: 1981/08/28 41 y.o. ? ?Admit date: 02/15/2022 ?Date of Consult: 02/15/2022 ? ?Primary Physician: [name], MD ?Primary Cardiologist: None  ?Electrophysiologist: Dr. Ladona Ridgel ? ?Referring Provider: Dr. Dalene Seltzer (ED) ? ?Patient Profile: ?Bradley Powell is a 41 y.o. male with a history of atrial flutter s/p ablation 2019, On Eliquis, Chronic combined CHF, Obesity, HTN, gout, Possible Cirrhosis, OSA, PVCs, and pulmonary HTN, who is being seen today for the evaluation of syncope and WCT at the request of Dr. Dalene Seltzer. ? ?HPI:  ?Bradley Powell is a 41 y.o. male with medical history as above. ? ?Underwent atrial flutter ablation 04/06/2018. Subsequently had marked 1st degree AV block with progression to junctional rhythm. Coreg cut back and stabilized.  ? ?Last seen 04/2020 by Dr. Ladona Ridgel. AF was stable at that time and recommended consideration of Tikosyn, and possible consideration of ICD depending on his EF.  ? ?cMRI 05/2020 showed LVEF 45%, RVEF 44%, and likely amyloidosis/infiltrative cardiomyopathy ? ?Echo 04/2021 LVEF 45-50% ? ?cMRI 09/2021 showed LVEF 43%, RVEF 44%, and findings suggested as being more likely sarcoid than amyloid given normal LV thickness. LGE pattern suggestive of sarcoidosis. ?CT chest 11/11/2021 without evidence of pulmonary sarcoidosis ? ?Has been following with Dr. Jomarie Longs for [facility]. Felt to have characteristics for a genetic CM but not to have pathogenic variant for HCM.  ? ?Today, was sitting in [employer] when verbalized "I do not feel well" and had syncope and seizure like activity.  On EMS arrival pt had normal blood sugar, HR into 200s. Read as AF. Offered [facility] but patient declined. He was given 20 mg and then an additional 10 mg of cardizem with conversion sinus bradycardia in the 20s, associated with N/V. Pt required external pacing at a rate of 60 and 120 mV. ?  ?Was  mentating well on arrival to ED, with ongoing pacing. Reported recent volume overload. Reports prodrome of lightheadedness prior to syncope. Denies CP or SOB. No N/V other than with episode of bradycardia. Reports med compliance.  ? ?Pertinent labs on admission include K 3.9, Hgb 17, WBC 6.7, Cr 1.3, Glucose 166 ? ?CXR without acute finding. ? ?EKG shows AF with slow VR at 56 bpm ? ?Currently, he is lying in bed in NAD. Denies recent illness. No N/V/D leading up to episode today, but did have N/V after as above.  No recent fevers. No chest pain. He does report gradual volume overload over at least the past month. States he is taking medications as prescribed. Was taking torsemide 40 or 60 mg daily.  ? ?Past Medical History:  ?Diagnosis Date  ? Atrial flutter (HCC)   ? a. s/p ablation 03/2018.  ? Chronic combined systolic and diastolic CHF (congestive heart failure) (HCC)   ? History of esophagogastroduodenoscopy (EGD)   ? Morbid obesity (HCC)   ? NICM (nonischemic cardiomyopathy) (HCC)   ? PVC's (premature ventricular contractions)   ? Sleep apnea   ?  ? ?Surgical History:  ?Past Surgical History:  ?Procedure Laterality Date  ? A-FLUTTER ABLATION N/A 04/06/2018  ? Procedure: A-FLUTTER ABLATION;  Surgeon: Marinus Maw, MD;  Location: [facility] INVASIVE CV LAB;  Service: Cardiovascular;  Laterality: N/A;  ? ESOPHAGEAL BRUSHING  06/19/2020  ? Procedure: ESOPHAGEAL BRUSHING;  Surgeon: Charlott Rakes, MD;  Location: [facility] ENDOSCOPY;  Service: Endoscopy;;  ? ESOPHAGOGASTRODUODENOSCOPY (EGD) WITH PROPOFOL Left 06/19/2020  ? Procedure: ESOPHAGOGASTRODUODENOSCOPY (EGD) WITH PROPOFOL;  Surgeon: Bosie Clos,  Oswaldo Done, MD;  Location: [facility] ENDOSCOPY;  Service: Endoscopy;  Laterality: Left;  ? NO PAST SURGERIES    ? RIGHT HEART CATH N/A 12/20/2019  ? Procedure: RIGHT HEART CATH;  Surgeon: Dolores Patty, MD;  Location: [facility] INVASIVE CV LAB;  Service: Cardiovascular;  Laterality: N/A;  ? RIGHT/LEFT HEART CATH AND CORONARY ANGIOGRAPHY N/A  03/01/2017  ? Procedure: Right/Left Heart Cath and Coronary Angiography;  Surgeon: Dolores Patty, MD;  Location: [facility] INVASIVE CV LAB;  Service: Cardiovascular;  Laterality: N/A;  ?  ? ?(Not in a hospital admission) ? ? ?Inpatient Medications:  ? ?Allergies:  ?Allergies  ?Allergen Reactions  ? Entresto [Sacubitril-Valsartan] Nausea And Vomiting and Other (See Comments)  ?  Lightheaded, fainting  ? ? ?Social History  ? ?Socioeconomic History  ? Marital status: Single  ?  Spouse name: Not on file  ? Number of children: Not on file  ? Years of education: Not on file  ? Highest education level: Not on file  ?Occupational History  ? Not on file  ?Tobacco Use  ? Smoking status: Never  ? Smokeless tobacco: Never  ?Vaping Use  ? Vaping Use: Never used  ?Substance and Sexual Activity  ? Alcohol use: Yes  ?  Comment: rarely  ? Drug use: No  ? Sexual activity: Not Currently  ?  Partners: Female  ?Other Topics Concern  ? Not on file  ?Social History Narrative  ? Not on file  ? ?Social Determinants of Health  ? ?Financial Resource Strain: Not on file  ?Food Insecurity: Not on file  ?Transportation Needs: Not on file  ?Physical Activity: Not on file  ?Stress: Not on file  ?Social Connections: Not on file  ?Intimate Partner Violence: Not on file  ?  ? ?Family History  ?Problem Relation Age of Onset  ? CVA Mother   ?     pacemaker  ? Multiple sclerosis Mother   ? Seizures Mother   ? Hypertension Father   ? Gout Father   ?  ? ?Review of Systems: ?All other systems reviewed and are otherwise negative except as noted above. ? ?Physical Exam: ?Vitals:  ? 02/15/22 1037 02/15/22 1038 02/15/22 1047  ?BP: 90/60    ?Pulse: (!) 54    ?Resp: (!) 9    ?Temp: 97.9 ?F (36.6 ?C)    ?TempSrc: Oral    ?SpO2: 100% 100%   ?Weight:   117.9 kg  ?Height:   5\' 8"  (1.727 m)  ? ? ?GEN- The patient is well appearing, alert and oriented x 3 today.   ?HEENT: normocephalic, atraumatic; sclera clear, conjunctiva pink; hearing intact; oropharynx clear; neck  supple ?Lungs- Clear to ausculation bilaterally, normal work of breathing.  No wheezes, rales, rhonchi ?Heart- Regular rate and rhythm, no murmurs, rubs or gallops ?GI- soft, non-tender, non-distended, bowel sounds present ?Extremities- no clubbing, cyanosis, or edema; DP/PT/radial pulses 2+ bilaterally ?MS- no significant deformity or atrophy ?Skin- warm and dry, no rash or lesion ?Psych- euthymic mood, full affect ?Neuro- strength and sensation are intact ? ?Labs: ?  ?Lab Results  ?Component Value Date  ? WBC 6.7 02/15/2022  ? HGB 18.4 (H) 02/15/2022  ? HCT 54.0 (H) 02/15/2022  ? MCV 93.0 02/15/2022  ? PLT 287 02/15/2022  ?  ?Recent Labs  ?Lab 02/15/22 ?1051  ?NA 139  ?K 3.9  ?CL 104  ?BUN 17  ?CREATININE 1.30*  ?GLUCOSE 166*  ? ? ?  ?Radiology/Studies: DG Chest Portable 1 View ? ?Result Date:  02/15/2022 ?CLINICAL DATA:  Syncope, ventricular tachycardia, congestive heart failure. EXAM: PORTABLE CHEST 1 VIEW COMPARISON:  CT 11/10/2021.  Radiographs 06/15/2020 and 02/25/2017. FINDINGS: 1045 hours. Suboptimal inspiration. The heart size is at the upper limits of normal for portable technique. The pulmonary vascularity appears normal. No evidence of edema, confluent airspace opacity, pleural effusion or pneumothorax. The bones appear unremarkable. Telemetry leads and external pacer overlie the chest. IMPRESSION: No evidence of active cardiopulmonary process. Electronically Signed   By: Carey Bullocks M.D.   On: 02/15/2022 10:55   ? ?EKG: shows AF with slow VR at 56 bpm (personally reviewed)  ?Baseline EKG 08/11/21 shows same. QRS ~90-110 ms ? ?TELEMETRY: AF with controlled VR 50-60s (personally reviewed) ? ? ?Assessment/Plan: ?1.  Syncope with possible VT ?Strips show WCT as above/attached. ?QRS has been narrow.  ?He is likely permanent AF, would not need atrial lead.  ?Dexter Signor discuss AAD with MD. Likely amiodarone given structural disease.  ?? If need to consider pet scan if felt to be more likely sarcoidosis despite no  evidence of extracardiac sarcoid.  ? Repeating cMRI as well to look for active inflammation.  ?Discussed no driving x 6 months.  ?Freeland Pracht avoid amiodarone for now given his young age.  ?Suhas Estis likely need ICD prior to

## 2022-02-15 NOTE — ED Triage Notes (Signed)
Pt bib ems from [employer] c/t a-fib RVR 215 HR and possible seizure.  ? ?Pt was given 20 mg Cardizem and 1000 ml NS. After receiving medication pt started to vomit. Pt HR rhythm changed to 25 sinus bradycardic.  ? ?Pt arrived being paced by EMS 120 mAMP rate 60.  ? ? ?

## 2022-02-15 NOTE — Progress Notes (Signed)
Heart Failure Navigator Progress Note ? ?Assessed for Heart & Vascular TOC clinic readiness.  ?Patient does not meet criteria due to seen by the Advanced Heart Failure Team.  ? ? ? ?Rhae Hammock, BSN, RN ?Heart Failure Nurse Navigator ?[phone]   ?

## 2022-02-15 NOTE — ED Notes (Signed)
Pt transported to vascular for echo  

## 2022-02-15 NOTE — Progress Notes (Signed)
RN Notified that the patient had 28beats of VT ? ?Patient asymptomatic and used restroom. Vitals stable ?He is being diuresed with IV lasix, BMP in am: K was 3.8 and mag y'day was 1.8 ?- both not replaced ? ? - check electrolytes, give Kdur 50meq daily, give 2gms Mag ? - replace electrolytes daily ? ? ?

## 2022-02-15 NOTE — ED Notes (Signed)
Pt has bilateral swelling in lower legs. +1 pitting. ? ?Pt denies pain upon palpation. Pt states he feels his condition is d/t the extra fluids.  ?Pt states his normal dose of torsemide is 80 mg; howb  ?

## 2022-02-15 NOTE — Progress Notes (Signed)
ANTICOAGULATION CONSULT NOTE - Follow Up Consult ? ?Pharmacy Consult for IV heparin ?Indication: atrial fibrillation ? ?Allergies  ?Allergen Reactions  ? Entresto [Sacubitril-Valsartan] Nausea And Vomiting and Other (See Comments)  ?  Lightheaded, fainting  ? ? ?Patient Measurements: ?Height: 5\' 8"  (172.7 cm) ?Weight: 117.9 kg (260 lb) ?IBW/kg (Calculated) : 68.4 ?Heparin Dosing Weight: 95.2 kg ? ?Vital Signs: ?Temp: 97.9 ?F (36.6 ?C) (04/04 1037) ?Temp Source: Oral (04/04 1037) ?BP: 112/77 (04/04 1200) ?Pulse Rate: 66 (04/04 1200) ? ?Labs: ?Recent Labs  ?  02/15/22 ?1027 02/15/22 ?1051  ?HGB 17.0 18.4*  ?HCT 54.4* 54.0*  ?PLT 287  --   ?CREATININE 1.38* 1.30*  ?TROPONINIHS 24*  --   ? ? ?Estimated Creatinine Clearance: 96.1 mL/min (A) (by C-G formula based on SCr of 1.3 mg/dL (H)). ? ? ?Medications:  ?Infusions:  ? calcium gluconate 1,000 mg (02/15/22 1230)  ? ? ?Assessment: ?41 year old with a history of combined systolic/diastolic heart failure, He underwent atrial flutter ablation on 04/06/18. Subsequently had marked 1st AV block with progression to junctional rhythm. esity, HTN, A flutter, A flutter ablation, and gout.  On eliquis PTA. Pharmacy consulted for IV heparin ? ?Last dose of apixaban was 6am this morning ?H/H stable ? ?Goal of Therapy:  ?Heparin level 0.3-0.7 units/ml ?Monitor platelets by anticoagulation protocol: Yes ?  ?Plan:  ?Plan to start Heparin drip at 1150 units/hr to start at 1800 (12 hours post last dose of apixaban)  ?Heparin level/aptt at 0300 ?Daily heparin level/aptt until levels correlate and CBC ordered ?Monitor for signs/symptoms of bleed ? ?Thank you for allowing pharmacy to be a part of this patient?s care. ? ?04/08/18, PharmD ?Clinical Pharmacist ? ?Please check AMION for all [facility] Pharmacy numbers ?After 10:00 PM, call Main Pharmacy [phone] ? ? ?

## 2022-02-16 ENCOUNTER — Other Ambulatory Visit (HOSPITAL_COMMUNITY): Payer: Self-pay

## 2022-02-16 ENCOUNTER — Inpatient Hospital Stay (HOSPITAL_COMMUNITY): Payer: 59

## 2022-02-16 DIAGNOSIS — I472 Ventricular tachycardia, unspecified: Secondary | ICD-10-CM

## 2022-02-16 DIAGNOSIS — I5023 Acute on chronic systolic (congestive) heart failure: Secondary | ICD-10-CM | POA: Diagnosis not present

## 2022-02-16 DIAGNOSIS — R55 Syncope and collapse: Secondary | ICD-10-CM | POA: Diagnosis not present

## 2022-02-16 LAB — CBC
HCT: 46.4 % (ref 39.0–52.0)
Hemoglobin: 15.2 g/dL (ref 13.0–17.0)
MCH: 28.9 pg (ref 26.0–34.0)
MCHC: 32.8 g/dL (ref 30.0–36.0)
MCV: 88.2 fL (ref 80.0–100.0)
Platelets: 179 10*3/uL (ref 150–400)
RBC: 5.26 MIL/uL (ref 4.22–5.81)
RDW: 13.5 % (ref 11.5–15.5)
WBC: 8.7 10*3/uL (ref 4.0–10.5)
nRBC: 0 % (ref 0.0–0.2)

## 2022-02-16 LAB — HEPARIN LEVEL (UNFRACTIONATED): Heparin Unfractionated: 0.31 IU/mL (ref 0.30–0.70)

## 2022-02-16 LAB — ECHOCARDIOGRAM COMPLETE
Height: 68 in
MV M vel: 4 m/s
MV Peak grad: 64 mmHg
MV VTI: 1.53 cm2
Radius: 0.4 cm
S' Lateral: 4 cm
Weight: 4720 oz

## 2022-02-16 LAB — APTT: aPTT: 42 seconds — ABNORMAL HIGH (ref 24–36)

## 2022-02-16 LAB — HIV ANTIBODY (ROUTINE TESTING W REFLEX): HIV Screen 4th Generation wRfx: NONREACTIVE

## 2022-02-16 NOTE — Progress Notes (Signed)
?  Echocardiogram ?2D Echocardiogram has been performed. ? Bradley Powell ?02/16/2022, 11:10 AM ?

## 2022-02-16 NOTE — Progress Notes (Signed)
?  02/16/22 1630  ?Clinical Encounter Type  ?Visited With Patient  ?Visit Type Spiritual support;Initial  ?Referral From Nurse ?(Shlondria P. Chrisandra Carota, Therapist, sports)  ?Consult/Referral To Chaplain  ?Spiritual Encounters  ?Spiritual Needs Prayer  ? ?Spiritual Care Consultation: Patient Request for Prayer placed by Shlondria P. Chrisandra Carota, Therapist, sports.  ?for Mr. Preston Garabedian. [place]. Met with Mr. Tuberville at patient's bedside. 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 Patient shared his frightening experience that occurred this week while sitting at [facility] and passing out. Thankful that his friend was present and was able to contact EMS so that he could get proper medical care. Mr. Lukas discussed his lengthy family history of cardiovascular disease and shared his concerns about living with his heart failure. Offered patient up in prayers for healing, comfort and strength. [address] [place], M. Min., [phone]. ? ?

## 2022-02-16 NOTE — Progress Notes (Signed)
Pt placed on CPAP full face mask tolerating well. ?

## 2022-02-16 NOTE — Progress Notes (Signed)
ANTICOAGULATION CONSULT NOTE - Follow Up Consult ? ?Pharmacy Consult for IV heparin ?Indication: atrial fibrillation ? ?Allergies  ?Allergen Reactions  ? Entresto [Sacubitril-Valsartan] Nausea And Vomiting and Other (See Comments)  ?  Lightheaded, fainting  ? ? ?Patient Measurements: ?Height: 5\' 8"  (172.7 cm) ?Weight: 133.8 kg (295 lb) ?IBW/kg (Calculated) : 68.4 ?Heparin Dosing Weight: 95.2 kg ? ?Vital Signs: ?Temp: 98.6 ?F (37 ?C) (04/05 [id number]) ?Temp Source: Oral (04/05 [id number]) ?BP: 130/98 (04/05 0742) ?Pulse Rate: 77 (04/05 0742) ? ?Labs: ?Recent Labs  ?  02/15/22 ?1027 02/15/22 ?1051 02/15/22 ?1517 02/15/22 ?2128 02/16/22 ?0326  ?HGB 17.0 18.4*  --   --  15.2  ?HCT 54.4* 54.0*  --   --  46.4  ?PLT 287  --   --   --  179  ?APTT  --   --   --   --  42*  ?HEPARINUNFRC  --   --   --   --  0.31  ?CREATININE 1.38* 1.30*  --  1.40*  --   ?TROPONINIHS 24*  --  225*  --   --   ? ? ? ?Estimated Creatinine Clearance: 95.7 mL/min (A) (by C-G formula based on SCr of 1.4 mg/dL (H)). ? ? ?Medications:  ?Infusions:  ? sodium chloride    ? heparin 1,150 Units/hr (02/15/22 2045)  ? ? ?Assessment: ?41 year old with a history of combined systolic/diastolic heart failure, He underwent atrial flutter ablation on 04/06/18. Subsequently had marked 1st AV block with progression to junctional rhythm. esity, HTN, A flutter, A flutter ablation, and gout.  On eliquis PTA. Pharmacy consulted for IV heparin ? ?Last dose of apixaban was 6am this morning ?H/H stable ? ?Goal of Therapy:  ?Heparin level 0.3-0.7 units/ml ?Monitor platelets by anticoagulation protocol: Yes ?  ?Plan:  ?Heparin to 1250 units / hr ?Stop PTTs ?Daily heparin level, CBC ? ?Thank you ?Anette Guarneri, PharmD ? ?Please check AMION for all [place] numbers ?After 10:00 PM, call [place] [phone] ? ? ?

## 2022-02-16 NOTE — TOC Benefit Eligibility Note (Signed)
Patient Advocate Encounter ? ?Insurance verification completed.   ? ?The patient is currently admitted and upon discharge could be taking mexiletine (Mexitil) 150 mg capsule. ? ?Product Not On Formulary ? ?The patient is insured through [employer]  ? ? ? ?Roland Earl, CPhT ?Pharmacy Patient Advocate Specialist ?[facility] Pharmacy Patient Advocate Team ?Direct Number: [phone]  Fax: [phone] ? ? ? ? ? ?  ?

## 2022-02-16 NOTE — Progress Notes (Addendum)
? ? Advanced Heart Failure Rounding Note ? ?PCP-Cardiologist: None  ? ?Subjective:   ?Admitted after syncope--> WCT and A/C HFrEF.  ?  ?Started on IV lasix. Brisk diuresis noted.  ? ?He is not sure he wants ICD. Says he wants more information.  ? ? ?Objective:   ?Weight Range: ?133.8 kg ?Body mass index is 44.85 kg/m?.  ? ?Vital Signs:   ?Temp:  [97.7 ?F (36.5 ?C)-98.6 ?F (37 ?C)] 97.7 ?F (36.5 ?C) (04/05 1108) ?Pulse Rate:  [60-82] 60 (04/05 1108) ?Resp:  [11-23] 18 (04/05 1108) ?BP: (104-130)/(66-102) 116/86 (04/05 1108) ?SpO2:  [96 %-100 %] 100 % (04/05 1108) ?Weight:  [133.8 kg-134 kg] 133.8 kg (04/05 0142) ?Last BM Date : 02/15/22 ? ?Weight change: ?Filed Weights  ? 02/15/22 1047 02/15/22 1959 02/16/22 0142  ?Weight: 117.9 kg 134 kg 133.8 kg  ? ? ?Intake/Output:  ? ?Intake/Output Summary (Last 24 hours) at 02/16/2022 1250 ?Last data filed at 02/16/2022 0830 ?Gross per 24 hour  ?Intake 1106.53 ml  ?Output 3975 ml  ?Net -2868.47 ml  ?  ? ? ?Physical Exam  ?  ?General:  Sitting on the side.  No resp difficulty ?HEENT: Normal ?Neck: Supple. JVP to the jaw Carotids 2+ bilat; no bruits. No lymphadenopathy or thyromegaly appreciated. ?Cor: PMI nondisplaced. Regular rate & rhythm. No rubs, gallops or murmurs. ?Lungs: Clear ?Abdomen: obese, soft, nontender, nondistended. No hepatosplenomegaly. No bruits or masses. Good bowel sounds. ?Extremities: No cyanosis, clubbing, rash, R and LLE 2+ edema ?Neuro: Alert & orientedx3, cranial nerves grossly intact. moves all 4 extremities w/o difficulty. Affect pleasant ? ? ?Telemetry  ? ?A fib with episode of NSVT on 4/4  ? ?EKG  ?  ?N/A ? ?Labs  ?  ?CBC ?Recent Labs  ?  02/15/22 ?1027 02/15/22 ?1051 02/16/22 ?0326  ?WBC 6.7  --  8.7  ?NEUTROABS 4.9  --   --   ?HGB 17.0 18.4* 15.2  ?HCT 54.4* 54.0* 46.4  ?MCV 93.0  --  88.2  ?PLT 287  --  179  ? ?Basic Metabolic Panel ?Recent Labs  ?  02/15/22 ?1027 02/15/22 ?1051 02/15/22 ?2128  ?NA 140 139 142  ?K 3.8 3.9 3.9  ?CL 106 104 107  ?CO2  19*  --  26  ?GLUCOSE 168* 166* 81  ?BUN 15 17 16   ?CREATININE 1.38* 1.30* 1.40*  ?CALCIUM 7.9*  --  8.5*  ?MG 1.8  --  1.9  ? ?Liver Function Tests ?Recent Labs  ?  02/15/22 ?1027  ?AST 35  ?ALT 25  ?ALKPHOS 114  ?BILITOT 2.6*  ?PROT 4.6*  ?ALBUMIN 2.0*  ? ?No results for input(s): LIPASE, AMYLASE in the last 72 hours. ?Cardiac Enzymes ?No results for input(s): CKTOTAL, CKMB, CKMBINDEX, TROPONINI in the last 72 hours. ? ?BNP: ?BNP (last 3 results) ?Recent Labs  ?  06/18/21 ?1218 08/11/21 ?1652 02/15/22 ?1027  ?BNP 315.5* 198.8* 202.1*  ? ? ?ProBNP (last 3 results) ?No results for input(s): PROBNP in the last 8760 hours. ? ? ?D-Dimer ?No results for input(s): DDIMER in the last 72 hours. ?Hemoglobin A1C ?No results for input(s): HGBA1C in the last 72 hours. ?Fasting Lipid Panel ?No results for input(s): CHOL, HDL, LDLCALC, TRIG, CHOLHDL, LDLDIRECT in the last 72 hours. ?Thyroid Function Tests ?No results for input(s): TSH, T4TOTAL, T3FREE, THYROIDAB in the last 72 hours. ? ?Invalid input(s): FREET3 ? ?Other results: ? ? ?Imaging  ? ? ?No results found. ? ? ?Medications:   ? ? ?Scheduled Medications: ?  allopurinol  200 mg Oral Daily  ? furosemide  80 mg Intravenous BID  ? mexiletine  150 mg Oral Q12H  ? pantoprazole  40 mg Oral Daily  ? potassium chloride  40 mEq Oral Daily  ? sodium chloride flush  3 mL Intravenous Q12H  ? ? ?Infusions: ? sodium chloride    ? heparin 1,150 Units/hr (02/15/22 2045)  ? ? ?PRN Medications: ?sodium chloride, acetaminophen, ondansetron (ZOFRAN) IV, sodium chloride flush ? ? ? ?Patient Profile  ? ?Bradley Powell is a 41 year old with a history of combined systolic/diastolic heart failure, He underwent atrial flutter ablation on 04/06/18. Subsequently had marked 1st AV block with progression to junctional rhythm. esity, HTN, A flutter, A flutter ablation, and gout.  ?  ?Admitted after syncope--> WCT and A/C HFrEF.  ?  ? ?Assessment/Plan  ? ?1. Syncope--> WCT ?-Had NSVT late yesterday.   ?-EP  consulted for ICD.  ?-AA per EP--> Started on mexiletine 150 mg twice a day.   ?-No driving 6 months.  ?- Hold eliquis for now. Continue heparin drip.  ?  ?2. A/C HFrEF ?ECHO 02/22/2017 EF 40-45% Grade II DD. Peak PA pressure 46 mmhg. Echo 9/18 EF ~20-25%. Echo 01/2018: EF 40-45%  ?- Echo 1/20  EF 35-40% with mild RV dysfunction RVSP 40 mmHG ?- Echo (12/16/19): EF 40-45% Moderate RV dysfunction  ?- cMRI 8/21 EF 45% + infiltrative process suggestive of possible amyloidosis ?- He was seen by Dr. Jomarie Longs for [facility]. Felt to have characteristics for a genetic CM but not to have pathogenic variant for HCM.  ?- cMRI raises concern for TTR amyloid. Genetic testing 2021 "uncertain variant for HCM. Negative for TTR" .  MM panel negative.  ?- PYP 4/22. Ratio 1.26 read as equivocal.  ?- cMRI repeated 09/2021 EF 43% LGE concerning for sarcoid.  ?- Given MRI findings and equivocal PYP suspect next step will need to consider endomyocardial biopsy. Will need to refer to [facility] for biopsy.   ?Dr [name] reached out to Dr Jomarie Longs to further assess for LMNA.  ?- Remains volume overloaded. Continue  80 mg IV lasix twice a day.  ?- Add unna boots.  ?- No bb with bradycardia.  ?- No Arni/MRA due to hypotension ?- Renal function stable.  ?  ?3. PAF ?S/p a flutter ablation 04/06/18 with Dr. Ladona Ridgel. ?- On eliquis prior to admit currently held. Continue heparin drip until all procedures completed.  ?  ?4. OSA ?- Continue CPAP ?  ?5. PVCs  ?  ?6. CKD Stage IIIa ?- Creatinine 1.4 today. Follow daily BMET ?  ?7. Obesity ? ? ? ?Length of Stay: 1 ? ?Bradley Becket, NP  ?02/16/2022, 12:50 PM ? ?Advanced Heart Failure Team ?Pager [phone] (M-F; 7a - 5p)  ?Please contact [facility] for night-coverage after hours (5p -7a ) and weekends on [email or web address] ? ?Patient seen and examined with the above-signed Advanced Practice Provider and/or Housestaff. I personally reviewed laboratory data, imaging studies and relevant notes. I independently examined the  patient and formulated the important aspects of the plan. I have edited the note to reflect any of my changes or salient points. I have personally discussed the plan with the patient and/or family. ? ?Diuresing well on IV lasix. Now on mexilitene, VT quiescent, Remains in slow AF. Denies CP, SOB, orthopnea or PND.  ? ?General:  Sitting up in bed No resp difficulty ?HEENT: normal ?Neck: supple. JVP to ear Carotids 2+ bilat; no bruits. No lymphadenopathy or  thryomegaly appreciated. ?Cor: PMI nondisplaced. Irregular rate & rhythm. No rubs, gallops or murmurs. ?Lungs: clear ?Abdomen: obese soft, nontender, nondistended. No hepatosplenomegaly. No bruits or masses. Good bowel sounds. ?Extremities: no cyanosis, clubbing, rash, 2+ edema ?Neuro: alert & orientedx3, cranial nerves grossly intact. moves all 4 extremities w/o difficulty. Affect pleasant ? ?Diuresing well but remains significantly fluid overloaded, Continue IV lasix. Add metolazone. Wrap legs. Continue mexilitene. Suspect ICD on Friday.  ? ?I discussed case with Genetics. Have not sent DNA for Cardiomyopathy screen yet. I placed order for scree including LMNA. If LMNA negative will need to consider empiric treatment for sarcoid.  ? ?Glori Bickers, MD  ?11:49 PM ? ?

## 2022-02-16 NOTE — TOC Benefit Eligibility Note (Signed)
Patient Advocate Encounter ?  ?Received notification fthat prior authorization for Mexiletine 150 mg capsules is required. ?  ?PA submitted on 02/16/2022 ?Key [id number] ?Status is pending ?   ? ? ? ?Roland Earl, CPhT ?Pharmacy Patient Advocate Specialist ?[facility] Pharm[facility] Patient Advocate Team ?Direct Number: [phone]  Fax: [phone]  ?

## 2022-02-16 NOTE — TOC Benefit Eligibility Note (Signed)
Patient Advocate Encounter ? ?Prior Authorization for Mexiletine HCl 150MG  capsules has been approved.   ? ?PA# ?Effective dates: 02/16/2022 through 02/17/2023 ? ?Patients co-pay is $0.00.  ? ? ? ?04/19/2023, CPhT ?Pharmacy Patient Advocate Specialist ?[facility] Patient Advocate Team ?Direct Number: [phone]  Fax: [phone]  ?

## 2022-02-16 NOTE — Progress Notes (Signed)
Orthopedic Tech Progress Note ?Patient Details:  ?Bradley Powell ?03-Apr-1981 ?[id number] ? ?Ortho Devices ?Type of Ortho Device: Unna boot ?Ortho Device/Splint Location: BLE ?Ortho Device/Splint Interventions: Ordered, Application ?  ?Post Interventions ?Patient Tolerated: Well ?Instructions Provided: Care of device ? ?[name] ?02/16/2022, 6:46 PM ? ?

## 2022-02-16 NOTE — Plan of Care (Signed)
  Problem: Education: Goal: Ability to demonstrate management of disease process will improve Outcome: Progressing   Problem: Education: Goal: Ability to verbalize understanding of medication therapies will improve Outcome: Progressing   

## 2022-02-16 NOTE — Progress Notes (Addendum)
? ?Electrophysiology Rounding Note ? ?Patient Name: TAREZ CARDELLO ?Date of Encounter: 02/16/2022 ? ?Primary Cardiologist: None ?Electrophysiologist: Dr. Lovena Le ? ? ?Subjective  ? ?NAEO. Wearing CPAP ? ?Inpatient Medications  ?  ?Scheduled Meds: ? allopurinol  200 mg Oral Daily  ? furosemide  80 mg Intravenous BID  ? mexiletine  150 mg Oral Q12H  ? pantoprazole  40 mg Oral Daily  ? potassium chloride  40 mEq Oral Daily  ? sodium chloride flush  3 mL Intravenous Q12H  ? ?Continuous Infusions: ? sodium chloride    ? heparin 1,150 Units/hr (02/15/22 2045)  ? ?PRN Meds: ?sodium chloride, acetaminophen, ondansetron (ZOFRAN) IV, sodium chloride flush  ? ?Vital Signs  ?  ?Vitals:  ? 02/15/22 2010 02/16/22 0014 02/16/22 0015 02/16/22 0142  ?BP: 105/74 104/66 105/74   ?Pulse: 76 65 82   ?Resp:   15   ?Temp: 98 ?F (36.7 ?C) 98 ?F (36.7 ?C)    ?TempSrc: Oral Oral    ?SpO2: 100% 96% 100%   ?Weight:    133.8 kg  ?Height:      ? ? ?Intake/Output Summary (Last 24 hours) at 02/16/2022 0710 ?Last data filed at 02/16/2022 0141 ?Gross per 24 hour  ?Intake 866.53 ml  ?Output 3975 ml  ?Net -3108.47 ml  ? ?Filed Weights  ? 02/15/22 1047 02/15/22 1959 02/16/22 0142  ?Weight: 117.9 kg 134 kg 133.8 kg  ? ? ?Physical Exam  ?  ?GEN- The patient is well appearing, alert and oriented x 3 today.   ?Head- normocephalic, atraumatic ?Eyes-  Sclera clear, conjunctiva pink ?Ears- hearing intact ?Oropharynx- clear ?Neck- supple ?Lungs- Clear to ausculation bilaterally, normal work of breathing ?Heart- Irregularly irregular rate and rhythm, no murmurs, rubs or gallops ?GI- soft, NT, ND, + BS ?Extremities- no clubbing or cyanosis. No edema ?Skin- no rash or lesion ?Psych- euthymic mood, full affect ?Neuro- strength and sensation are intact ? ?Labs  ?  ?CBC ?Recent Labs  ?  02/15/22 ?1027 02/15/22 ?1051 02/16/22 ?0326  ?WBC 6.7  --  8.7  ?NEUTROABS 4.9  --   --   ?HGB 17.0 18.4* 15.2  ?HCT 54.4* 54.0* 46.4  ?MCV 93.0  --  88.2  ?PLT 287  --  179  ? ?Basic  Metabolic Panel ?Recent Labs  ?  02/15/22 ?1027 02/15/22 ?1051 02/15/22 ?2128  ?NA 140 139 142  ?K 3.8 3.9 3.9  ?CL 106 104 107  ?CO2 19*  --  26  ?GLUCOSE 168* 166* 81  ?BUN 15 17 16   ?CREATININE 1.38* 1.30* 1.40*  ?CALCIUM 7.9*  --  8.5*  ?MG 1.8  --  1.9  ? ?Liver Function Tests ?Recent Labs  ?  02/15/22 ?1027  ?AST 35  ?ALT 25  ?ALKPHOS 114  ?BILITOT 2.6*  ?PROT 4.6*  ?ALBUMIN 2.0*  ? ?No results for input(s): LIPASE, AMYLASE in the last 72 hours. ?Cardiac Enzymes ?No results for input(s): CKTOTAL, CKMB, CKMBINDEX, TROPONINI in the last 72 hours. ? ? ?Telemetry  ?  ?AF with controlled VR. NSVT towards the beginning of the night that has quieted down. Now only rare PVCs (personally reviewed) ? ?Radiology  ?  ?CT Head Wo Contrast ? ?Result Date: 02/15/2022 ?CLINICAL DATA:  New onset seizure.  No history of trauma. EXAM: CT HEAD WITHOUT CONTRAST TECHNIQUE: Contiguous axial images were obtained from the base of the skull through the vertex without intravenous contrast. RADIATION DOSE REDUCTION: This exam was performed according to the departmental dose-optimization program which  includes automated exposure control, adjustment of the mA and/or kV according to patient size and/or use of iterative reconstruction technique. COMPARISON:  None. FINDINGS: Brain: The ventricles are normal in size and configuration. No extra-axial fluid collections are identified. The gray-white differentiation is maintained. No CT findings for acute hemispheric infarction or intracranial hemorrhage. No mass lesions. The brainstem and cerebellum are normal. Vascular: No hyperdense vessels or obvious aneurysm. Skull: No acute skull fracture.  No bone lesion. Sinuses/Orbits: The paranasal sinuses and mastoid air cells are clear. The globes are intact. Other: No scalp lesions, laceration or hematoma. IMPRESSION: Normal head CT. Electronically Signed   By: Marijo Sanes M.D.   On: 02/15/2022 11:38  ? ?DG Chest Portable 1 View ? ?Result Date:  02/15/2022 ?CLINICAL DATA:  Syncope, ventricular tachycardia, congestive heart failure. EXAM: PORTABLE CHEST 1 VIEW COMPARISON:  CT 11/10/2021.  Radiographs 06/15/2020 and 02/25/2017. FINDINGS: 1045 hours. Suboptimal inspiration. The heart size is at the upper limits of normal for portable technique. The pulmonary vascularity appears normal. No evidence of edema, confluent airspace opacity, pleural effusion or pneumothorax. The bones appear unremarkable. Telemetry leads and external pacer overlie the chest. IMPRESSION: No evidence of active cardiopulmonary process. Electronically Signed   By: Richardean Sale M.D.   On: 02/15/2022 10:55   ? ?Patient Profile  ?   ?MARQUON CIPRES is a 41 y.o. male with a history of atrial flutter s/p ablation 2019, On Eliquis, Chronic combined CHF, Obesity, HTN, gout, Possible Cirrhosis, OSA, PVCs, and pulmonary HTN, who is being seen today for the evaluation of syncope and WCT at the request of Dr. Billy Fischer. ? ?Assessment & Plan  ?  ?1.  Syncope with likely VT ?Strips show WCT as above/attached. ?QRS has been narrow chronically.  ?He is likely permanent AF, would not need atrial lead.  ?Consider mexitil if doesn't remain in stable rhythm with management of his CHF.  ?Discussed no driving x 6 months.  ?Will likely need ICD prior to discharge.  ?NSVT overnight but only rare PVCs since around 2100 ?Would plan on up-titrating BB post device.  ?  ?2. Combined Systolic/Diastolic Heart Failure ?cMRI 11/22 with LVEF 43% and possible sarcoid.  ?Chronic NYHA II-III symptoms ?Volume status remains markedly elevated.  ?Brisk response to diuresis noted.  ?Has seen Dr. Broadus John for Genetics Counseling. Felt to have characteristics for a genetic CM but not to have pathogenic variant for HCM.  ?cMRI raised concern for TTR amyloid initially (negative by genetic testing), repeat more suggestive of sarcoid as above.  ?It is felt his mother may have LMNA cardiomyopathy, but testing has been  non-diagnostic for both of them. ?  ?3. Pulmonary HTN ?- RV strain on echo.  ?- RHC in 2/21 minimal PAH with low PAPi. ?- Needs to continue CPAP.  ?  ?4. Morbid Obesity: ?Body mass index is 44.85 kg/m?Marland Kitchen  ?Continue weight loss efforts ?  ?5. PVCs ?Previous monitor only showed 6%, but felt to possibly be contributing to cardiomyopathy.  ?  ?6. Longstanding persistent atrial fib/Atrial flutter  ?S/p a flutter ablation 04/06/18 with Dr. Lovena Le. ?EKG 12/16/2019 shows ? Junctional rhythm at 72 bpm ?Subsequent EKGs all show AF with controlled rate.  ?On Eliquis chronically  ?CHA2DS2VASC is at least 2. ? ?Dr. Lovena Le to see ? ?For questions or updates, please contact [place] ?Please consult [email or web address] for contact info under Cardiology/STEMI. ? ?Signed, ?Shirley Friar, PA-C  ?02/16/2022, 7:10 AM  ? ?EP Attending ? ?Patient seen and examined. Agree  with the findings as noted above. The patient is doing well with no recurrent VT. He has underlying atrial fib and his VR is controlled. Remotely he had atrial flutter but has developed persistent atrial fib. His exam demonstrates a fluid overloaded middle aged man, NAD. Lungs with scattered rales and CV with IRIR rhythm. Extremities with 3+ bilateral edema. Tele demonstrates atrial fib with controlled VR. ?A/P ?VT - I have reviewed the strips and he has a Right bundle VT with a right inferior axis at 200/min. He was successfully cardioverted. I have recommended ICD insertion for VT and syncope.  ?Chronic systolic and diastolic heart failure - he has evidence of volume overloade and I would recommend IV diuresis over the next few days prior to ICD insertion. ?Syncope - this was due to the VT. Fortunately his bp came up a bit after his syncopal episode despite being in VT. ? ?Salome Spotted ? ?

## 2022-02-17 ENCOUNTER — Encounter (HOSPITAL_COMMUNITY): Payer: Self-pay | Admitting: Internal Medicine

## 2022-02-17 ENCOUNTER — Inpatient Hospital Stay (HOSPITAL_COMMUNITY): Payer: 59

## 2022-02-17 DIAGNOSIS — I5023 Acute on chronic systolic (congestive) heart failure: Secondary | ICD-10-CM | POA: Diagnosis not present

## 2022-02-17 DIAGNOSIS — R55 Syncope and collapse: Secondary | ICD-10-CM | POA: Diagnosis not present

## 2022-02-17 LAB — CBC
HCT: 46.3 % (ref 39.0–52.0)
Hemoglobin: 15 g/dL (ref 13.0–17.0)
MCH: 28.8 pg (ref 26.0–34.0)
MCHC: 32.4 g/dL (ref 30.0–36.0)
MCV: 89 fL (ref 80.0–100.0)
Platelets: 218 10*3/uL (ref 150–400)
RBC: 5.2 MIL/uL (ref 4.22–5.81)
RDW: 13.6 % (ref 11.5–15.5)
WBC: 6.5 10*3/uL (ref 4.0–10.5)
nRBC: 0 % (ref 0.0–0.2)

## 2022-02-17 LAB — BASIC METABOLIC PANEL
Anion gap: 7 (ref 5–15)
BUN: 16 mg/dL (ref 6–20)
CO2: 24 mmol/L (ref 22–32)
Calcium: 7.9 mg/dL — ABNORMAL LOW (ref 8.9–10.3)
Chloride: 107 mmol/L (ref 98–111)
Creatinine, Ser: 1.35 mg/dL — ABNORMAL HIGH (ref 0.61–1.24)
GFR, Estimated: >60 mL/min (ref >60)
Glucose, Bld: 96 mg/dL (ref 70–99)
Potassium: 3.3 mmol/L — ABNORMAL LOW (ref 3.5–5.1)
Sodium: 138 mmol/L (ref 135–145)

## 2022-02-17 LAB — MRSA NEXT GEN BY PCR, NASAL: MRSA by PCR Next Gen: NOT DETECTED

## 2022-02-17 LAB — MAGNESIUM
Magnesium: 1.8 mg/dL (ref 1.7–2.4)
Magnesium: 1.8 mg/dL (ref 1.7–2.4)

## 2022-02-17 LAB — HEPARIN LEVEL (UNFRACTIONATED): Heparin Unfractionated: 0.13 IU/mL — ABNORMAL LOW (ref 0.30–0.70)

## 2022-02-17 LAB — POTASSIUM: Potassium: 4.2 mmol/L (ref 3.5–5.1)

## 2022-02-17 IMAGING — DX DG ABDOMEN 1V
3 series · 3 of 3 positions shown · non-contrast
Comparison: None.

CLINICAL DATA: Vomiting.

EXAM:
ABDOMEN - 1 VIEW

[abdomen kub (1 of 3)]
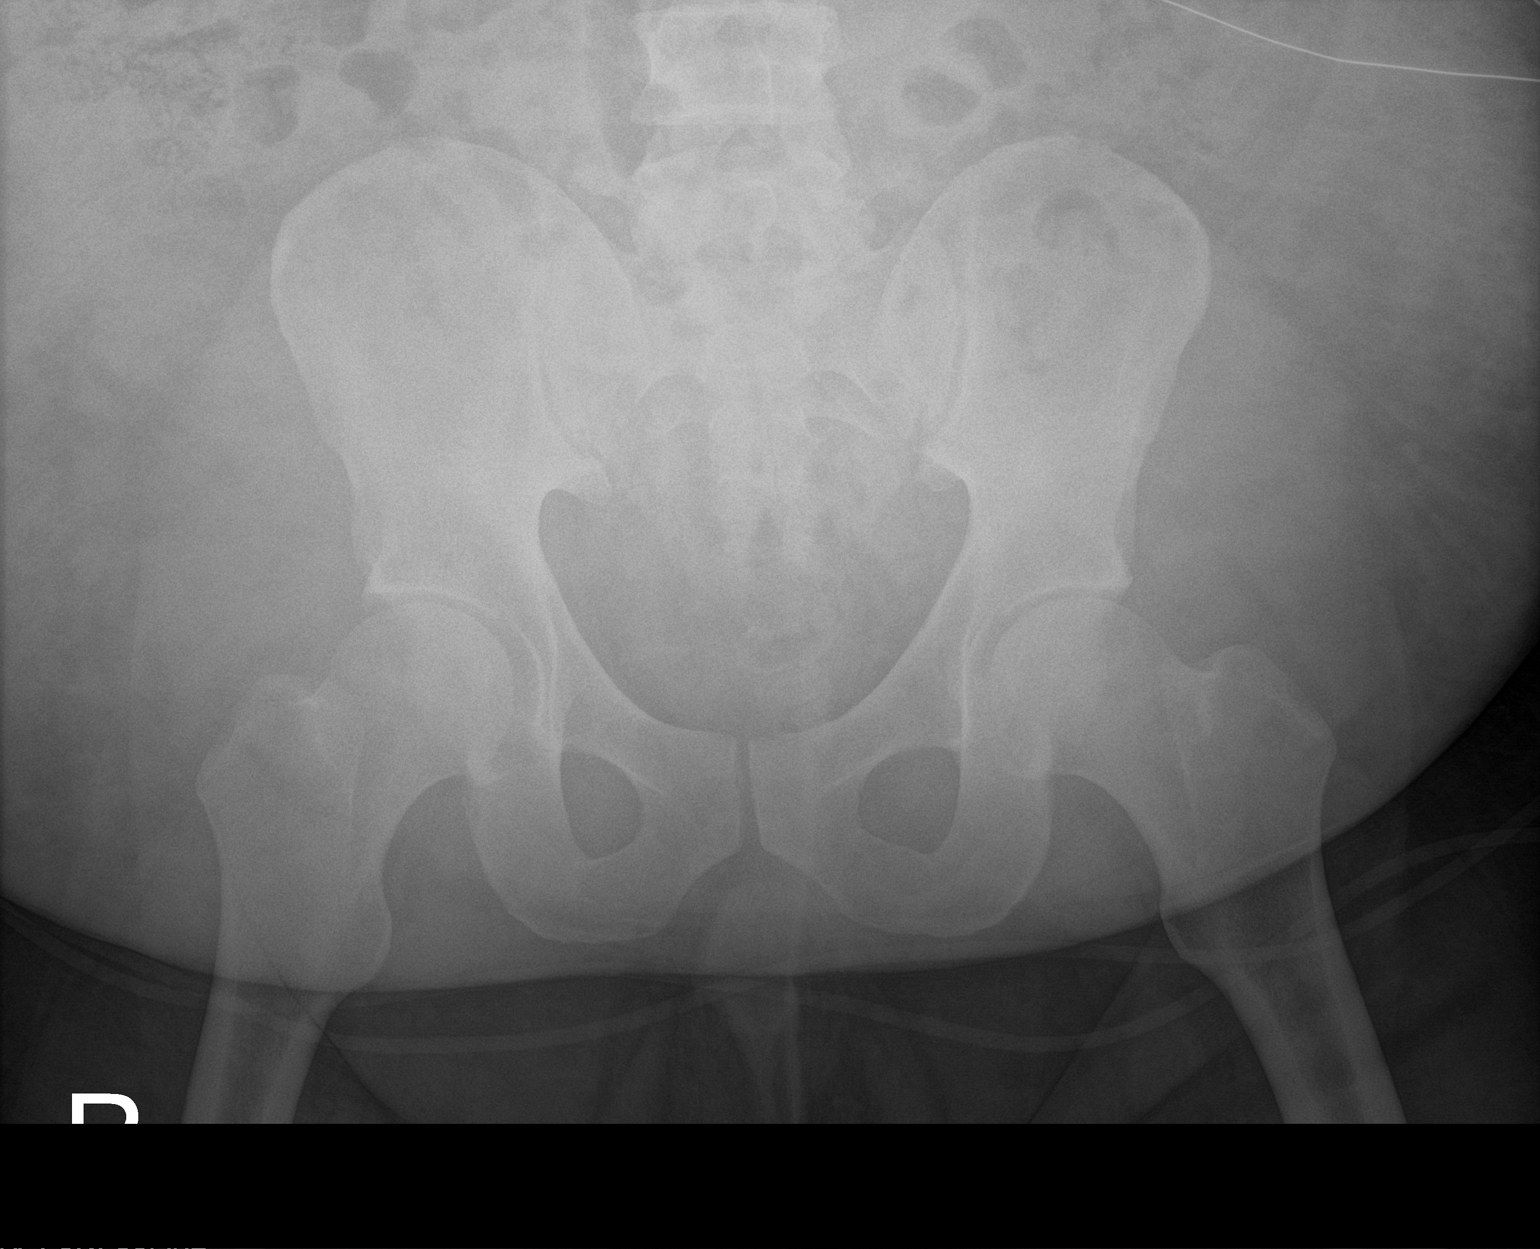

[abdomen kub (2 of 3)]
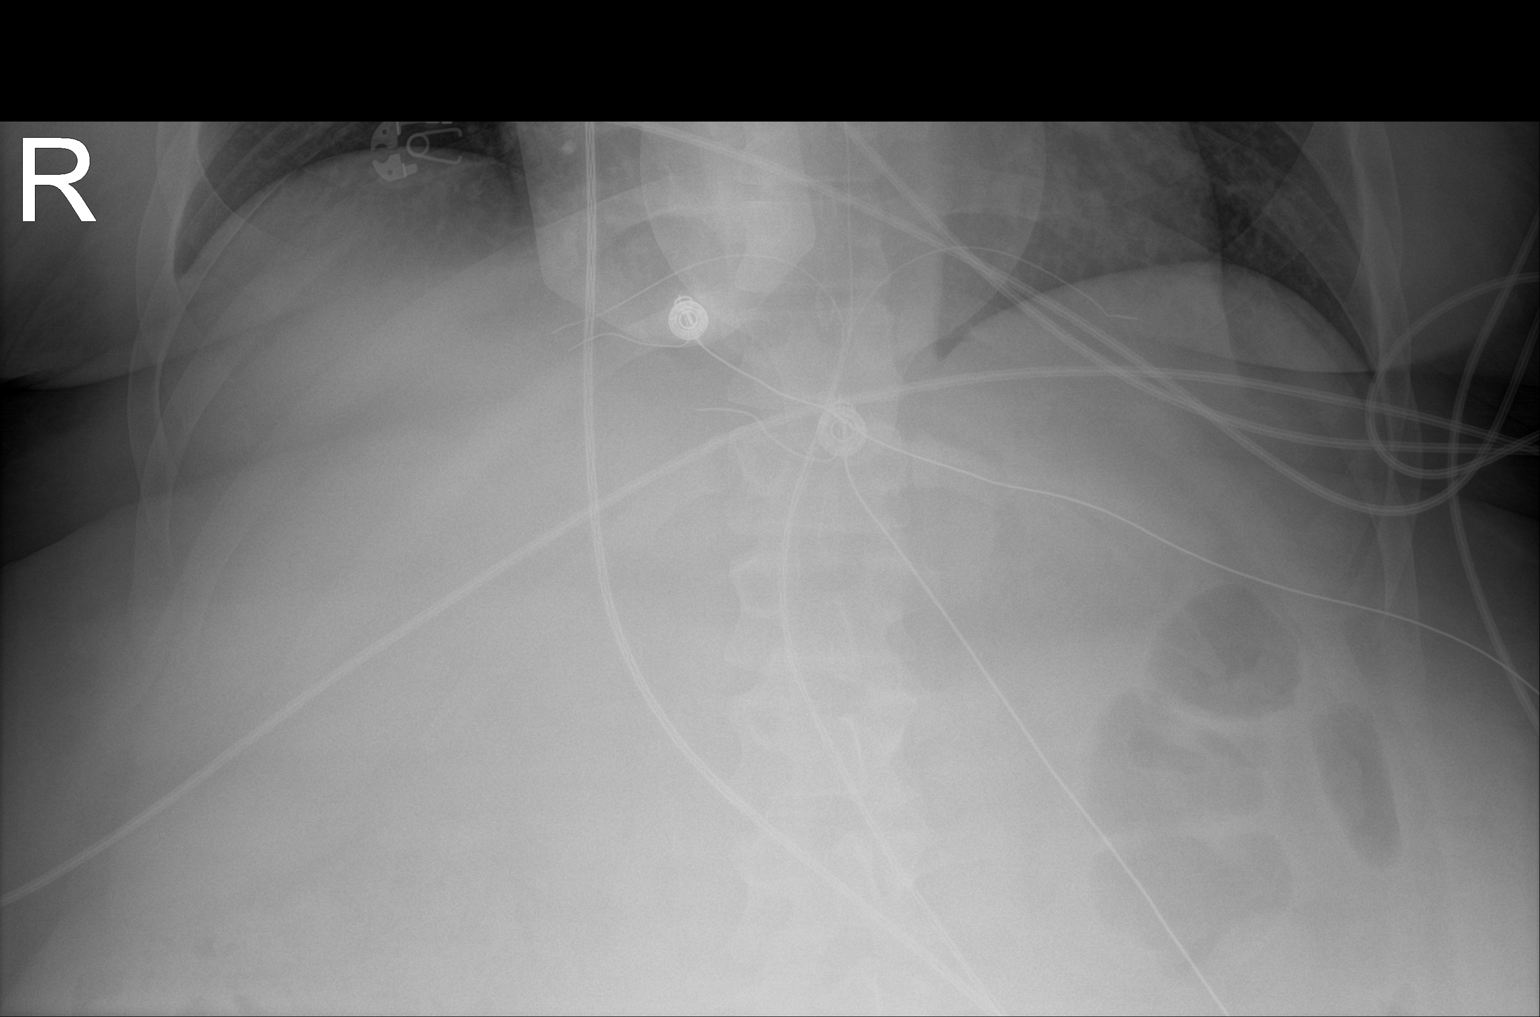

[abdomen kub (3 of 3)]
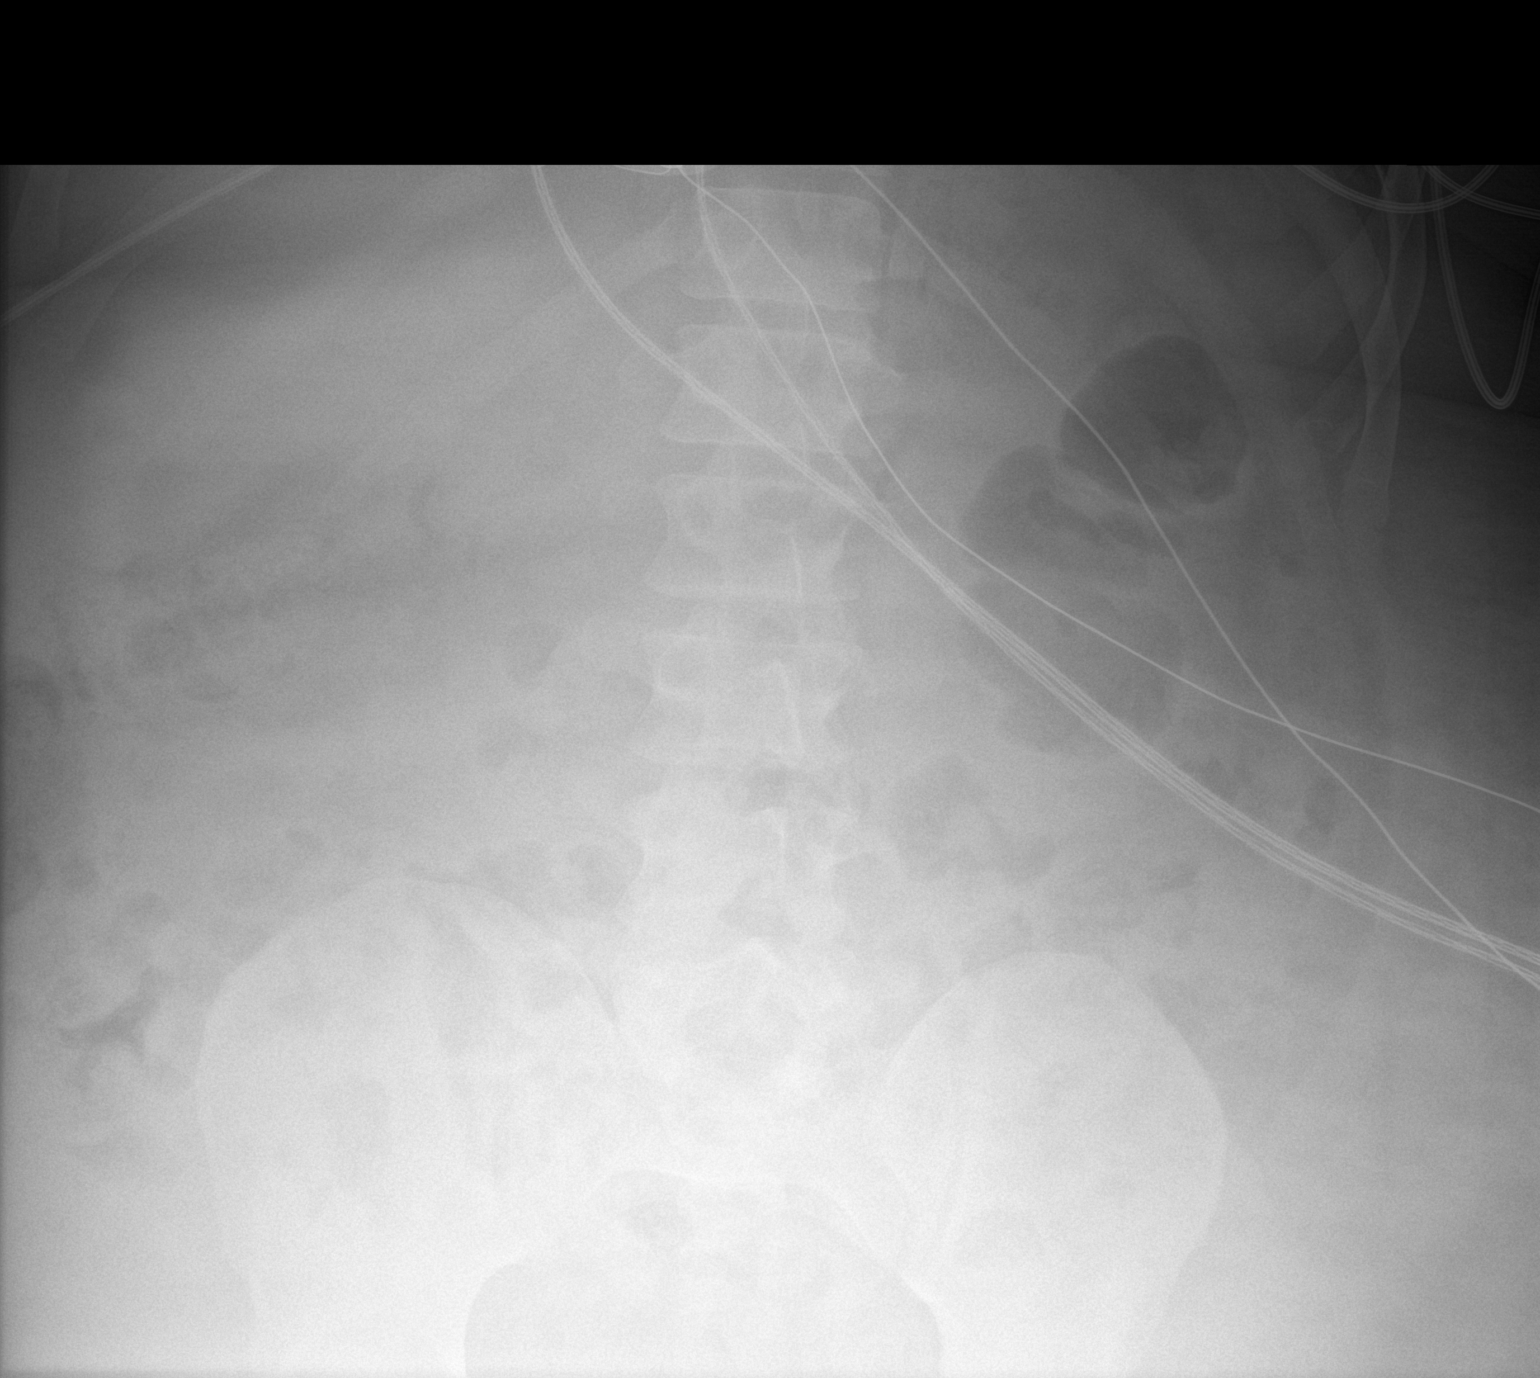

[3 of 3 positions shown; findings below may reference images not displayed]

FINDINGS: The bowel gas pattern is normal. No radio-opaque calculi or other
significant radiographic abnormality are seen.
IMPRESSION: Nonobstructive bowel gas pattern.

## 2022-02-17 MED ORDER — CHLORHEXIDINE GLUCONATE CLOTH 2 % EX PADS
6.0000 | MEDICATED_PAD | Freq: Every day | CUTANEOUS | Status: DC
  Administered 2022-02-17 – 2022-02-25 (×9): 6 via TOPICAL

## 2022-02-17 MED ORDER — HEPARIN (PORCINE) 25000 UT/250ML-% IV SOLN
1500.0000 [IU]/h | INTRAVENOUS | Status: AC
  Administered 2022-02-18 – 2022-02-21 (×5): 1600 [IU]/h via INTRAVENOUS
  Administered 2022-02-21: 1500 [IU]/h via INTRAVENOUS
  Filled 2022-02-17 (×6): qty 250

## 2022-02-17 MED ORDER — LORAZEPAM 2 MG/ML IJ SOLN
1.0000 mg | Freq: Once | INTRAMUSCULAR | Status: AC
  Administered 2022-02-17: 1 mg via INTRAVENOUS
  Filled 2022-02-17: qty 1

## 2022-02-17 MED ORDER — AMIODARONE LOAD VIA INFUSION
150.0000 mg | Freq: Once | INTRAVENOUS | Status: AC
  Administered 2022-02-17: 150 mg via INTRAVENOUS
  Filled 2022-02-17: qty 83.34

## 2022-02-17 MED ORDER — POTASSIUM CHLORIDE CRYS ER 20 MEQ PO TBCR
40.0000 meq | EXTENDED_RELEASE_TABLET | Freq: Two times a day (BID) | ORAL | Status: DC
  Administered 2022-02-17 (×2): 40 meq via ORAL
  Filled 2022-02-17: qty 2

## 2022-02-17 MED ORDER — AMIODARONE IV BOLUS ONLY 150 MG/100ML
150.0000 mg | Freq: Once | INTRAVENOUS | Status: AC
  Administered 2022-02-17: 150 mg via INTRAVENOUS
  Filled 2022-02-17: qty 100

## 2022-02-17 MED ORDER — METOLAZONE 2.5 MG PO TABS
2.5000 mg | ORAL_TABLET | Freq: Once | ORAL | Status: AC
  Administered 2022-02-17: 2.5 mg via ORAL
  Filled 2022-02-17: qty 1

## 2022-02-17 MED ORDER — FENTANYL CITRATE PF 50 MCG/ML IJ SOSY
PREFILLED_SYRINGE | INTRAMUSCULAR | Status: AC
  Filled 2022-02-17: qty 1

## 2022-02-17 MED ORDER — FUROSEMIDE 10 MG/ML IJ SOLN
15.0000 mg/h | INTRAVENOUS | Status: AC
  Administered 2022-02-17 – 2022-02-19 (×5): 15 mg/h via INTRAVENOUS
  Filled 2022-02-17 (×6): qty 20

## 2022-02-17 MED ORDER — AMIODARONE HCL IN DEXTROSE 360-4.14 MG/200ML-% IV SOLN
30.0000 mg/h | INTRAVENOUS | Status: DC
Start: 2022-02-17 — End: 2022-02-17

## 2022-02-17 MED ORDER — SPIRONOLACTONE 25 MG PO TABS
25.0000 mg | ORAL_TABLET | Freq: Every day | ORAL | Status: DC
  Administered 2022-02-17 – 2022-02-27 (×11): 25 mg via ORAL
  Filled 2022-02-17 (×11): qty 1

## 2022-02-17 MED ORDER — MIDAZOLAM HCL 2 MG/2ML IJ SOLN
INTRAMUSCULAR | Status: AC
  Filled 2022-02-17: qty 4

## 2022-02-17 MED ORDER — AMIODARONE HCL IN DEXTROSE 360-4.14 MG/200ML-% IV SOLN
30.0000 mg/h | INTRAVENOUS | Status: DC
Start: 2022-02-17 — End: 2022-02-21
  Administered 2022-02-17 – 2022-02-21 (×8): 30 mg/h via INTRAVENOUS
  Filled 2022-02-17 (×7): qty 200

## 2022-02-17 MED ORDER — POTASSIUM CHLORIDE CRYS ER 20 MEQ PO TBCR
40.0000 meq | EXTENDED_RELEASE_TABLET | Freq: Two times a day (BID) | ORAL | Status: DC
Start: 2022-02-18 — End: 2022-02-18
  Filled 2022-02-17: qty 2

## 2022-02-17 MED ORDER — POTASSIUM CHLORIDE CRYS ER 20 MEQ PO TBCR: 40.0000 meq | EXTENDED_RELEASE_TABLET | Freq: Once | ORAL | Status: DC

## 2022-02-17 MED ORDER — ADENOSINE 6 MG/2ML IV SOLN
INTRAVENOUS | Status: AC
  Filled 2022-02-17: qty 2

## 2022-02-17 MED ORDER — AMIODARONE HCL IN DEXTROSE 360-4.14 MG/200ML-% IV SOLN: 60.0000 mg/h | INTRAVENOUS | Status: DC

## 2022-02-17 NOTE — Significant Event (Signed)
Rapid Response Progress Note  ? ?Reason for Call : SVT ? ?On arrival, patient laying in bed alert and oriented. Dr. Kittie Plater at bedside with PA and pharmacist. Pt in SVT with rate in 200s. IV Amiodarone bolus given per MD. Shortly after arrival, patient came out of SVT to NSR in 80s. BP stable after amio bolus, 107/89. Second IV amio bolus given per MD by bedside RN. Pt had minimal lightheadedness during SVT event and did not endorse pain.  Lungs clear/diminished. Skin warm and dry. Order for transfer to [facility] ICU.  ? ?Plan of Care:  ?-Tx to 2H ? ? ?Event Summary:  ? ?MD Notified: Dr. [name] at bedside ?Call Time: 1049 ?Arrival Time: 1053 ?End Time: 1130 ? ?Lowella Petties, RN ?

## 2022-02-17 NOTE — Progress Notes (Addendum)
? ?Electrophysiology Rounding Note ? ?Patient Name: Bradley Powell ?Date of Encounter: 02/17/2022 ? ?Primary Cardiologist: Dr. Haroldine Laws  ?Electrophysiologist: Dr. Lovena Le ? ? ?Subjective  ? ?NAEO. Continues to diurese.  ? ?Inpatient Medications  ?  ?Scheduled Meds: ? allopurinol  200 mg Oral Daily  ? furosemide  80 mg Intravenous BID  ? mexiletine  150 mg Oral Q12H  ? pantoprazole  40 mg Oral Daily  ? potassium chloride  40 mEq Oral Daily  ? sodium chloride flush  3 mL Intravenous Q12H  ? ?Continuous Infusions: ? sodium chloride    ? heparin 1,250 Units/hr (02/17/22 0458)  ? ?PRN Meds: ?sodium chloride, acetaminophen, ondansetron (ZOFRAN) IV, sodium chloride flush  ? ?Vital Signs  ?  ?Vitals:  ? 02/17/22 0018 02/17/22 [id number] 02/17/22 [id number] 02/17/22 0815  ?BP: 114/75 125/75 (!) 127/94   ?Pulse: 62 76 64   ?Resp: 17 18 18    ?Temp: 98.4 ?F (36.9 ?C) 97.9 ?F (36.6 ?C) 97.6 ?F (36.4 ?C)   ?TempSrc: Oral Oral Oral   ?SpO2: 100% 99% 98%   ?Weight: 134 kg   135.3 kg  ?Height:      ? ? ?Intake/Output Summary (Last 24 hours) at 02/17/2022 0840 ?Last data filed at 02/17/2022 0458 ?Gross per 24 hour  ?Intake 1403.31 ml  ?Output 3250 ml  ?Net -1846.69 ml  ? ?Filed Weights  ? 02/16/22 0142 02/17/22 0018 02/17/22 0815  ?Weight: 133.8 kg 134 kg 135.3 kg  ? ? ?Physical Exam  ?  ?GEN- The patient is well appearing, alert and oriented x 3 today.   ?Head- normocephalic, atraumatic ?Eyes-  Sclera clear, conjunctiva pink ?Ears- hearing intact ?Oropharynx- clear ?Neck- supple ?Lungs- Clear to ausculation bilaterally, normal work of breathing ?Heart- Irregularly irregular rate and rhythm, no murmurs, rubs or gallops ?GI- soft, NT, ND, + BS ?Extremities- no clubbing or cyanosis. 2+ edema bilaterally ?Skin- no rash or lesion ?Psych- euthymic mood, full affect ?Neuro- strength and sensation are intact ? ?Labs  ?  ?CBC ?Recent Labs  ?  02/15/22 ?1027 02/15/22 ?1051 02/16/22 ?0326 02/17/22 ?0452  ?WBC 6.7  --  8.7 6.5  ?NEUTROABS 4.9  --   --   --    ?HGB 17.0   < > 15.2 15.0  ?HCT 54.4*   < > 46.4 46.3  ?MCV 93.0  --  88.2 89.0  ?PLT 287  --  179 218  ? < > = values in this interval not displayed.  ? ?Basic Metabolic Panel ?Recent Labs  ?  02/15/22 ?1027 02/15/22 ?1051 02/15/22 ?2128 02/17/22 ?0452  ?NA 140   < > 142 138  ?K 3.8   < > 3.9 3.3*  ?CL 106   < > 107 107  ?CO2 19*  --  26 24  ?GLUCOSE 168*   < > 81 96  ?BUN 15   < > 16 16  ?CREATININE 1.38*   < > 1.40* 1.35*  ?CALCIUM 7.9*  --  8.5* 7.9*  ?MG 1.8  --  1.9  --   ? < > = values in this interval not displayed.  ? ?Liver Function Tests ?Recent Labs  ?  02/15/22 ?1027  ?AST 35  ?ALT 25  ?ALKPHOS 114  ?BILITOT 2.6*  ?PROT 4.6*  ?ALBUMIN 2.0*  ? ?No results for input(s): LIPASE, AMYLASE in the last 72 hours. ?Cardiac Enzymes ?No results for input(s): CKTOTAL, CKMB, CKMBINDEX, TROPONINI in the last 72 hours. ? ? ?Telemetry  ?  ?AF with controlled  rates 60-70s (personally reviewed) ? ?Radiology  ?  ?CT Head Wo Contrast ? ?Result Date: 02/15/2022 ?CLINICAL DATA:  New onset seizure.  No history of trauma. EXAM: CT HEAD WITHOUT CONTRAST TECHNIQUE: Contiguous axial images were obtained from the base of the skull through the vertex without intravenous contrast. RADIATION DOSE REDUCTION: This exam was performed according to the departmental dose-optimization program which includes automated exposure control, adjustment of the mA and/or kV according to patient size and/or use of iterative reconstruction technique. COMPARISON:  None. FINDINGS: Brain: The ventricles are normal in size and configuration. No extra-axial fluid collections are identified. The gray-white differentiation is maintained. No CT findings for acute hemispheric infarction or intracranial hemorrhage. No mass lesions. The brainstem and cerebellum are normal. Vascular: No hyperdense vessels or obvious aneurysm. Skull: No acute skull fracture.  No bone lesion. Sinuses/Orbits: The paranasal sinuses and mastoid air cells are clear. The globes are  intact. Other: No scalp lesions, laceration or hematoma. IMPRESSION: Normal head CT. Electronically Signed   By: Marijo Sanes M.D.   On: 02/15/2022 11:38  ? ?DG Chest Portable 1 View ? ?Result Date: 02/15/2022 ?CLINICAL DATA:  Syncope, ventricular tachycardia, congestive heart failure. EXAM: PORTABLE CHEST 1 VIEW COMPARISON:  CT 11/10/2021.  Radiographs 06/15/2020 and 02/25/2017. FINDINGS: 1045 hours. Suboptimal inspiration. The heart size is at the upper limits of normal for portable technique. The pulmonary vascularity appears normal. No evidence of edema, confluent airspace opacity, pleural effusion or pneumothorax. The bones appear unremarkable. Telemetry leads and external pacer overlie the chest. IMPRESSION: No evidence of active cardiopulmonary process. Electronically Signed   By: Richardean Sale M.D.   On: 02/15/2022 10:55  ? ?ECHOCARDIOGRAM COMPLETE ? ?Result Date: 02/16/2022 ?   ECHOCARDIOGRAM REPORT   Patient Name:   Bradley Powell [facility] Date of Exam: 02/16/2022 Medical Rec #:  [id number]          Height:       68.0 in Accession #:    [id number]         Weight:       295.0 lb Date of Birth:  Jun 05, 1981         BSA:          2.411 m? Patient Age:    41 years           BP:           130/98 mmHg Patient Gender: M                  HR:           77 bpm. Exam Location:  Inpatient Procedure: 2D Echo, 3D Echo, Cardiac Doppler and Color Doppler Indications:    Congestive Heart Failure I50.9  History:        Patient has prior history of Echocardiogram examinations, most                 recent 05/12/2021. Arrythmias:Atrial Flutter and PVC; Risk                 Factors:Hypertension. Nonischemic cardiomyopathy.  Sonographer:    Darlina Sicilian RDCS Referring Phys: [id number] AMY D CLEGG IMPRESSIONS  1. Left ventricular ejection fraction, by estimation, is 35 to 40%. Left ventricular ejection fraction by 3D volume is 35 %. The left ventricle has moderately decreased function. The left ventricle demonstrates global hypokinesis. Left  ventricular diastolic function could not be evaluated.  2. Right ventricular systolic function is moderately reduced. The right ventricular size is moderately  enlarged. There is mildly elevated pulmonary artery systolic pressure.  3. Left atrial size was moderately dilated.  4. Right atrial size was mildly dilated.  5. The pericardial effusion is posterior to the left ventricle.  6. The mitral valve is normal in structure. Mild to moderate mitral valve regurgitation.  7. Tricuspid valve regurgitation is moderate.  8. The aortic valve is tricuspid. There is mild thickening of the aortic valve. Aortic valve regurgitation is not visualized. Aortic valve sclerosis is present, with no evidence of aortic valve stenosis.  9. The inferior vena cava is normal in size with greater than 50% respiratory variability, suggesting right atrial pressure of 3 mmHg. Comparison(s): Prior images reviewed side by side. The left ventricular function is worsened. The right ventricular systolic function is worse. FINDINGS  Left Ventricle: Left ventricular ejection fraction, by estimation, is 35 to 40%. Left ventricular ejection fraction by 3D volume is 35 %. The left ventricle has moderately decreased function. The left ventricle demonstrates global hypokinesis. The left ventricular internal cavity size was normal in size. There is no left ventricular hypertrophy. Left ventricular diastolic function could not be evaluated due to atrial fibrillation. Left ventricular diastolic function could not be evaluated. Right Ventricle: The right ventricular size is moderately enlarged. No increase in right ventricular wall thickness. Right ventricular systolic function is moderately reduced. There is mildly elevated pulmonary artery systolic pressure. The tricuspid regurgitant velocity is 2.33 m/s, and with an assumed right atrial pressure of 15 mmHg, the estimated right ventricular systolic pressure is 123XX123 mmHg. Left Atrium: Left atrial size was  moderately dilated. Right Atrium: Right atrial size was mildly dilated. Pericardium: Trivial pericardial effusion is present. The pericardial effusion is posterior to the left ventricle. Mitral Valve: The mi

## 2022-02-17 NOTE — Progress Notes (Signed)
ANTICOAGULATION CONSULT NOTE - Follow Up Consult ? ?Pharmacy Consult for IV heparin ?Indication: atrial fibrillation ? ?Allergies  ?Allergen Reactions  ? Entresto [Sacubitril-Valsartan] Nausea And Vomiting and Other (See Comments)  ?  Lightheaded, fainting  ? ? ?Patient Measurements: ?Height: 5\' 8"  (172.7 cm) ?Weight: 135.3 kg (298 lb 4.5 oz) ?IBW/kg (Calculated) : 68.4 ?Heparin Dosing Weight: 95.2 kg ? ?Vital Signs: ?Temp: 97.6 ?F (36.4 ?C) (04/06 [id number]) ?Temp Source: Oral (04/06 [id number]) ?BP: 127/94 (04/06 0724) ?Pulse Rate: 64 (04/06 0724) ? ?Labs: ?Recent Labs  ?  02/15/22 ?1027 02/15/22 ?1051 02/15/22 ?1517 02/15/22 ?2128 02/16/22 ?[id number] 02/17/22 ?[id number]  ?HGB 17.0 18.4*  --   --  15.2 15.0  ?HCT 54.4* 54.0*  --   --  46.4 46.3  ?PLT 287  --   --   --  179 218  ?APTT  --   --   --   --  42*  --   ?HEPARINUNFRC  --   --   --   --  0.31 0.13*  ?CREATININE 1.38* 1.30*  --  1.40*  --  1.35*  ?TROPONINIHS 24*  --  225*  --   --   --   ? ? ? ?Estimated Creatinine Clearance: 99.9 mL/min (A) (by C-G formula based on SCr of 1.35 mg/dL (H)). ? ? ?Medications:  ?Infusions:  ? sodium chloride    ? furosemide (LASIX) 200 mg in dextrose 5% 100 mL (2mg /mL) infusion    ? heparin 1,400 Units/hr (02/17/22 0948)  ? ? ?Assessment: ?41 year old with a history of combined systolic/diastolic heart failure, He underwent atrial flutter ablation on 04/06/18. Subsequently had marked 1st AV block with progression to junctional rhythm. esity, HTN, A flutter, A flutter ablation, and gout.  On eliquis PTA. Pharmacy consulted for IV heparin ? ?Last dose of apixaban was 6am 4/4 ?H/H stable ?Heparin drip 1250 uts/hr heparin level 0.13 < goal  ?No bleeding noted, cbc stable, no issues with IV line per patient  ? ?Goal of Therapy:  ?Heparin level 0.3-0.7 units/ml ?Monitor platelets by anticoagulation protocol: Yes ?  ?Plan:  ?Increase Heparin to 1400 units / hr ?Daily heparin level, CBC ? Resume apixaban after procedures complete next week ? ? ? ?Bonnita Nasuti Pharm.D. CPP, BCPS ?Clinical Pharmacist ?[phone] ?02/17/2022 10:21 AM  ? ? ?Please check AMION for all [place] numbers ?After 10:00 PM, call [place] [phone] ? ? ?

## 2022-02-17 NOTE — Progress Notes (Signed)
?  Patient developed sustained VT with rates 200-210.  ? ?Vitals measured closely. SBP soft but stable in 90-100 range. Mentating well. Sats ok. ? ?Crash cart moved into room. Pads placed emergently. EP at bedside as well.  ? ?Given 150 mg IV amio bolus. Patient with persistent VT. Was about to give 100mg  lidocaine but just before pushing patient broke back to NSR.  ? ?Patient moved to [facility] ICU.  ? ?Will continue low-dose amio gtt. Watch for bradycardia. Will need ICD this admit.  ? ?CCT 40 mins  ? ?Glori Bickers, MD  ?12:15 PM ? ?

## 2022-02-17 NOTE — Progress Notes (Addendum)
? ? Advanced Heart Failure Rounding Note ? ?PCP-Cardiologist: None  ? ?Subjective:   ?Admitted after syncope--> WCT and A/C HFrEF.  ?  ?Started on IV lasix. Negative  1.6 liters. Weight up 3 pounds.  ? ?Denies SOB. Complaining of cramps.  ? ?  ? ?Objective:   ?Weight Range: ?135.3 kg ?Body mass index is 45.35 kg/m?.  ? ?Vital Signs:   ?Temp:  [97.6 ?F (36.4 ?C)-98.4 ?F (36.9 ?C)] 97.6 ?F (36.4 ?C) (04/06 [id number]) ?Pulse Rate:  [60-76] 64 (04/06 0724) ?Resp:  [16-18] 18 (04/06 0724) ?BP: (114-127)/(74-94) 127/94 (04/06 0724) ?SpO2:  [98 %-100 %] 98 % (04/06 0724) ?Weight:  [134 kg-135.3 kg] 135.3 kg (04/06 0815) ?Last BM Date : 02/17/22 ? ?Weight change: ?Filed Weights  ? 02/16/22 0142 02/17/22 0018 02/17/22 0815  ?Weight: 133.8 kg 134 kg 135.3 kg  ? ? ?Intake/Output:  ? ?Intake/Output Summary (Last 24 hours) at 02/17/2022 0942 ?Last data filed at 02/17/2022 0825 ?Gross per 24 hour  ?Intake 1643.31 ml  ?Output 3250 ml  ?Net -1606.69 ml  ?  ? ? ?Physical Exam  ?  ?General:   No resp difficulty ?HEENT: normal ?Neck: supple. JVP to jaw . Carotids 2+ bilat; no bruits. No lymphadenopathy or thryomegaly appreciated. ?Cor: PMI nondisplaced. Irregular rate & rhythm. No rubs, gallops or murmurs. ?Lungs: clear ?Abdomen: obese, soft, nontender, nondistended. No hepatosplenomegaly. No bruits or masses. Good bowel sounds. ?Extremities: no cyanosis, clubbing, rash, R and LLE unna boots. 2+ lower extremity edema.  ?Neuro: alert & orientedx3, cranial nerves grossly intact. moves all 4 extremities w/o difficulty. Affect pleasant ? ? ?Telemetry  ? ?A Fib 70s with PVCs ~ 10 per hour  ? ?EKG  ?  ?N/A ? ?Labs  ?  ?CBC ?Recent Labs  ?  02/15/22 ?1027 02/15/22 ?1051 02/16/22 ?0326 02/17/22 ?0452  ?WBC 6.7  --  8.7 6.5  ?NEUTROABS 4.9  --   --   --   ?HGB 17.0   < > 15.2 15.0  ?HCT 54.4*   < > 46.4 46.3  ?MCV 93.0  --  88.2 89.0  ?PLT 287  --  179 218  ? < > = values in this interval not displayed.  ? ?Basic Metabolic Panel ?Recent Labs  ?   02/15/22 ?1027 02/15/22 ?1051 02/15/22 ?2128 02/17/22 ?0452  ?NA 140   < > 142 138  ?K 3.8   < > 3.9 3.3*  ?CL 106   < > 107 107  ?CO2 19*  --  26 24  ?GLUCOSE 168*   < > 81 96  ?BUN 15   < > 16 16  ?CREATININE 1.38*   < > 1.40* 1.35*  ?CALCIUM 7.9*  --  8.5* 7.9*  ?MG 1.8  --  1.9  --   ? < > = values in this interval not displayed.  ? ?Liver Function Tests ?Recent Labs  ?  02/15/22 ?1027  ?AST 35  ?ALT 25  ?ALKPHOS 114  ?BILITOT 2.6*  ?PROT 4.6*  ?ALBUMIN 2.0*  ? ?No results for input(s): LIPASE, AMYLASE in the last 72 hours. ?Cardiac Enzymes ?No results for input(s): CKTOTAL, CKMB, CKMBINDEX, TROPONINI in the last 72 hours. ? ?BNP: ?BNP (last 3 results) ?Recent Labs  ?  06/18/21 ?1218 08/11/21 ?1652 02/15/22 ?1027  ?BNP 315.5* 198.8* 202.1*  ? ? ?ProBNP (last 3 results) ?No results for input(s): PROBNP in the last 8760 hours. ? ? ?D-Dimer ?No results for input(s): DDIMER in the last 72 hours. ?Hemoglobin  A1C ?No results for input(s): HGBA1C in the last 72 hours. ?Fasting Lipid Panel ?No results for input(s): CHOL, HDL, LDLCALC, TRIG, CHOLHDL, LDLDIRECT in the last 72 hours. ?Thyroid Function Tests ?No results for input(s): TSH, T4TOTAL, T3FREE, THYROIDAB in the last 72 hours. ? ?Invalid input(s): FREET3 ? ?Other results: ? ? ?Imaging  ? ? ?ECHOCARDIOGRAM COMPLETE ? ?Result Date: 02/16/2022 ?   ECHOCARDIOGRAM REPORT   Patient Name:   Bradley Powell [facility] Date of Exam: 02/16/2022 Medical Rec #:  [id number]          Height:       68.0 in Accession #:    [id number]         Weight:       295.0 lb Date of Birth:  1981-03-29         BSA:          2.411 m? Patient Age:    41 years           BP:           130/98 mmHg Patient Gender: M                  HR:           77 bpm. Exam Location:  Inpatient Procedure: 2D Echo, 3D Echo, Cardiac Doppler and Color Doppler Indications:    Congestive Heart Failure I50.9  History:        Patient has prior history of Echocardiogram examinations, most                 recent 05/12/2021.  Arrythmias:Atrial Flutter and PVC; Risk                 Factors:Hypertension. Nonischemic cardiomyopathy.  Sonographer:    Darlina Sicilian RDCS Referring Phys: [phone] AMY D CLEGG IMPRESSIONS  1. Left ventricular ejection fraction, by estimation, is 35 to 40%. Left ventricular ejection fraction by 3D volume is 35 %. The left ventricle has moderately decreased function. The left ventricle demonstrates global hypokinesis. Left ventricular diastolic function could not be evaluated.  2. Right ventricular systolic function is moderately reduced. The right ventricular size is moderately enlarged. There is mildly elevated pulmonary artery systolic pressure.  3. Left atrial size was moderately dilated.  4. Right atrial size was mildly dilated.  5. The pericardial effusion is posterior to the left ventricle.  6. The mitral valve is normal in structure. Mild to moderate mitral valve regurgitation.  7. Tricuspid valve regurgitation is moderate.  8. The aortic valve is tricuspid. There is mild thickening of the aortic valve. Aortic valve regurgitation is not visualized. Aortic valve sclerosis is present, with no evidence of aortic valve stenosis.  9. The inferior vena cava is normal in size with greater than 50% respiratory variability, suggesting right atrial pressure of 3 mmHg. Comparison(s): Prior images reviewed side by side. The left ventricular function is worsened. The right ventricular systolic function is worse. FINDINGS  Left Ventricle: Left ventricular ejection fraction, by estimation, is 35 to 40%. Left ventricular ejection fraction by 3D volume is 35 %. The left ventricle has moderately decreased function. The left ventricle demonstrates global hypokinesis. The left ventricular internal cavity size was normal in size. There is no left ventricular hypertrophy. Left ventricular diastolic function could not be evaluated due to atrial fibrillation. Left ventricular diastolic function could not be evaluated. Right  Ventricle: The right ventricular size is moderately enlarged. No increase in right ventricular wall thickness. Right ventricular systolic function is  moderately reduced. There is mildly elevated pulmonary artery systolic pressure. The tricuspid regurgitant velocity is 2.33 m/s, and with an assumed right atrial pressure of 15 mmHg, the estimated right ventricular systolic pressure is 123XX123 mmHg. Left Atrium: Left atrial size was moderately dilated. Right Atrium: Right atrial size was mildly dilated. Pericardium: Trivial pericardial effusion is present. The pericardial effusion is posterior to the left ventricle. Mitral Valve: The mitral valve is normal in structure. Mild to moderate mitral valve regurgitation, with posteriorly-directed jet. MV peak gradient, 7.7 mmHg. The mean mitral valve gradient is 2.0 mmHg. Tricuspid Valve: The tricuspid valve is normal in structure. Tricuspid valve regurgitation is moderate. Aortic Valve: The aortic valve is tricuspid. There is mild thickening of the aortic valve. Aortic valve regurgitation is not visualized. Aortic valve sclerosis is present, with no evidence of aortic valve stenosis. Pulmonic Valve: The pulmonic valve was normal in structure. Pulmonic valve regurgitation is not visualized. Aorta: The aortic root and ascending aorta are structurally normal, with no evidence of dilitation. Venous: The inferior vena cava is normal in size with greater than 50% respiratory variability, suggesting right atrial pressure of 3 mmHg. IAS/Shunts: There is right bowing of the interatrial septum, suggestive of elevated left atrial pressure. No atrial level shunt detected by color flow Doppler.  LEFT VENTRICLE PLAX 2D LVIDd:         5.30 cm         Diastology LVIDs:         4.00 cm         LV e' medial:  7.66 cm/s LV PW:         0.90 cm         LV e' lateral: 9.44 cm/s LV IVS:        1.00 cm LVOT diam:     2.10 cm LV SV:         46              3D Volume EF LV SV Index:   19              LV  3D EF:    Left LVOT Area:     3.46 cm?                     ventricul                                             ar                                             ejection                                             fractio

## 2022-02-17 NOTE — Progress Notes (Signed)
ANTICOAGULATION CONSULT NOTE - Follow Up Consult ? ?Pharmacy Consult for IV heparin ?Indication: atrial fibrillation ? ?Allergies  ?Allergen Reactions  ? Entresto [Sacubitril-Valsartan] Nausea And Vomiting and Other (See Comments)  ?  Lightheaded, fainting  ? ? ?Patient Measurements: ?Height: 5\' 8"  (172.7 cm) ?Weight: 134.3 kg (296 lb 1.2 oz) ?IBW/kg (Calculated) : 68.4 ?Heparin Dosing Weight: 95.2 kg ? ?Vital Signs: ?Temp: 98 ?F (36.7 ?C) (04/06 1130) ?Temp Source: Oral (04/06 1130) ?BP: 116/92 (04/06 1430) ?Pulse Rate: 78 (04/06 1430) ? ?Labs: ?Recent Labs  ?  02/15/22 ?1027 02/15/22 ?1051 02/15/22 ?1517 02/15/22 ?2128 02/16/22 ?04/18/22 02/17/22 ?04/19/22  ?HGB 17.0 18.4*  --   --  15.2 15.0  ?HCT 54.4* 54.0*  --   --  46.4 46.3  ?PLT 287  --   --   --  179 218  ?APTT  --   --   --   --  42*  --   ?HEPARINUNFRC  --   --   --   --  0.31 0.13*  ?CREATININE 1.38* 1.30*  --  1.40*  --  1.35*  ?TROPONINIHS 24*  --  225*  --   --   --   ? ? ? ?Estimated Creatinine Clearance: 99.5 mL/min (A) (by C-G formula based on SCr of 1.35 mg/dL (H)). ? ? ?Medications:  ?Infusions:  ? sodium chloride    ? amiodarone 30 mg/hr (02/17/22 1500)  ? furosemide (LASIX) 200 mg in dextrose 5% 100 mL (2mg /mL) infusion 15 mg/hr (02/17/22 1500)  ? heparin    ? ? ?Assessment: ?41 year old with a history of combined systolic/diastolic heart failure, He underwent atrial flutter ablation on 04/06/18. Subsequently had marked 1st AV block with progression to junctional rhythm. esity, HTN, A flutter, A flutter ablation, and gout.  On eliquis PTA. Pharmacy consulted for IV heparin ? ?Last dose of apixaban was 6am 4/4 ?H/H stable ?Heparin drip 1250 uts/hr heparin level 0.13 < goal  ?No bleeding noted, cbc stable, no issues with IV line per patient  ? ?Plan for ICD delayed until next week. Plan to continue heparin until then. Will check a heparin level now.  ?Goal of Therapy:  ?Heparin level 0.3-0.7 units/ml ?Monitor platelets by anticoagulation protocol: Yes ?   ?Plan:  ?Cont Heparin to 1400 units / hr ?HL level now ?Daily heparin level, CBC ?Resume apixaban after procedures complete next week ? ? ?04/08/18, PharmD, BCIDP, AAHIVP, CPP ?Infectious Disease Pharmacist ?02/17/2022 3:47 PM ? ? ? ? ?

## 2022-02-17 NOTE — Progress Notes (Addendum)
Pt states he will placed cpap on self when ready. ?

## 2022-02-17 NOTE — Progress Notes (Signed)
Walked into pt room HR 200s . BP asymptomatic. Lying in bed BP stable. MD paged, and CN made aware of situation. Vague maneuver attempted, unsuccessful attempt. Dr. [name] arrived at the bedside. Amino bolus started. Zoll pads placed. Pt responded after bolus. Transferred to 2H Family made aware.  ?

## 2022-02-18 DIAGNOSIS — R55 Syncope and collapse: Secondary | ICD-10-CM | POA: Diagnosis not present

## 2022-02-18 DIAGNOSIS — I5023 Acute on chronic systolic (congestive) heart failure: Secondary | ICD-10-CM | POA: Diagnosis not present

## 2022-02-18 DIAGNOSIS — I472 Ventricular tachycardia, unspecified: Secondary | ICD-10-CM | POA: Diagnosis not present

## 2022-02-18 LAB — BASIC METABOLIC PANEL
Anion gap: 11 (ref 5–15)
BUN: 17 mg/dL (ref 6–20)
CO2: 24 mmol/L (ref 22–32)
Calcium: 8.4 mg/dL — ABNORMAL LOW (ref 8.9–10.3)
Chloride: 104 mmol/L (ref 98–111)
Creatinine, Ser: 1.48 mg/dL — ABNORMAL HIGH (ref 0.61–1.24)
GFR, Estimated: >60 mL/min (ref >60)
Glucose, Bld: 100 mg/dL — ABNORMAL HIGH (ref 70–99)
Potassium: 3.6 mmol/L (ref 3.5–5.1)
Sodium: 139 mmol/L (ref 135–145)

## 2022-02-18 LAB — CBC
HCT: 53.8 % — ABNORMAL HIGH (ref 39.0–52.0)
Hemoglobin: 17 g/dL (ref 13.0–17.0)
MCH: 28.7 pg (ref 26.0–34.0)
MCHC: 31.6 g/dL (ref 30.0–36.0)
MCV: 90.7 fL (ref 80.0–100.0)
Platelets: 241 10*3/uL (ref 150–400)
RBC: 5.93 MIL/uL — ABNORMAL HIGH (ref 4.22–5.81)
RDW: 13.4 % (ref 11.5–15.5)
WBC: 7.9 10*3/uL (ref 4.0–10.5)
nRBC: 0 % (ref 0.0–0.2)

## 2022-02-18 LAB — MAGNESIUM: Magnesium: 1.7 mg/dL (ref 1.7–2.4)

## 2022-02-18 LAB — HEPARIN LEVEL (UNFRACTIONATED): Heparin Unfractionated: 0.21 IU/mL — ABNORMAL LOW (ref 0.30–0.70)

## 2022-02-18 MED ORDER — POTASSIUM CHLORIDE CRYS ER 20 MEQ PO TBCR
20.0000 meq | EXTENDED_RELEASE_TABLET | Freq: Once | ORAL | Status: AC
  Administered 2022-02-18: 20 meq via ORAL
  Filled 2022-02-18: qty 1

## 2022-02-18 MED ORDER — METOLAZONE 2.5 MG PO TABS
2.5000 mg | ORAL_TABLET | Freq: Once | ORAL | Status: AC
  Administered 2022-02-18: 2.5 mg via ORAL
  Filled 2022-02-18: qty 1

## 2022-02-18 MED ORDER — POTASSIUM CHLORIDE CRYS ER 20 MEQ PO TBCR
40.0000 meq | EXTENDED_RELEASE_TABLET | Freq: Three times a day (TID) | ORAL | Status: DC
  Administered 2022-02-18 – 2022-02-26 (×25): 40 meq via ORAL
  Filled 2022-02-18 (×25): qty 2

## 2022-02-18 MED ORDER — MAGNESIUM SULFATE 2 GM/50ML IV SOLN
2.0000 g | Freq: Once | INTRAVENOUS | Status: AC
  Administered 2022-02-18: 2 g via INTRAVENOUS
  Filled 2022-02-18: qty 50

## 2022-02-18 NOTE — TOC CM/SW Note (Addendum)
.. ?  Transition of Care (TOC) Screening Note ? ? ?Patient Details  ?Name: NIKOLAUS PIENTA ?Date of Birth: 10-17-1981 ? ? ?Transition of Care [facility]) CM/SW Contact:    ?Mariea Stable Davene Costain, RN ?Phone Number: [phone] ?02/18/2022, 3:23 PM ? ? ? ?Transition of Care Department [facility]) has reviewed patient. We will continue to monitor patient advancement through interdisciplinary progression rounds.Will continue to follow for any dc needs. Please place a TOC Consult.  ? ? ? Isidoro Donning RN3 CCM, Heart Failure TOC CM [phone]  ?

## 2022-02-18 NOTE — Progress Notes (Signed)
ANTICOAGULATION CONSULT NOTE - Follow Up Consult ? ?Pharmacy Consult for IV heparin ?Indication: atrial fibrillation ? ?Allergies  ?Allergen Reactions  ? Entresto [Sacubitril-Valsartan] Nausea And Vomiting and Other (See Comments)  ?  Lightheaded, fainting  ? ? ?Patient Measurements: ?Height: 5\' 8"  (172.7 cm) ?Weight: 129.6 kg (285 lb 11.5 oz) ?IBW/kg (Calculated) : 68.4 ?Heparin Dosing Weight: 95.2 kg ? ?Vital Signs: ?Temp: 98.8 ?F (37.1 ?C) (04/07 [id number]) ?Temp Source: Axillary (04/07 [id number]) ?BP: 112/80 (04/07 0700) ?Pulse Rate: 73 (04/07 0700) ? ?Labs: ?Recent Labs  ?  02/15/22 ?1027 02/15/22 ?1051 02/15/22 ?1517 02/15/22 ?2128 02/16/22 ?[id number] 02/17/22 ?[id number] 02/18/22 ?0036  ?HGB 17.0   < >  --   --  15.2 15.0 17.0  ?HCT 54.4*   < >  --   --  46.4 46.3 53.8*  ?PLT 287  --   --   --  179 218 241  ?APTT  --   --   --   --  42*  --   --   ?HEPARINUNFRC  --   --   --   --  0.31 0.13* 0.21*  ?CREATININE 1.38*   < >  --  1.40*  --  1.35* 1.48*  ?TROPONINIHS 24*  --  225*  --   --   --   --   ? < > = values in this interval not displayed.  ? ? ? ?Estimated Creatinine Clearance: 88.9 mL/min (A) (by C-G formula based on SCr of 1.48 mg/dL (H)). ? ? ?Medications:  ?Infusions:  ? sodium chloride    ? amiodarone 30 mg/hr (02/18/22 0700)  ? furosemide (LASIX) 200 mg in dextrose 5% 100 mL (2mg /mL) infusion 15 mg/hr (02/18/22 0700)  ? heparin 1,400 Units/hr (02/18/22 0700)  ? magnesium sulfate bolus IVPB    ? ? ?Assessment: ?41 year old with a history of combined systolic/diastolic heart failure, He underwent atrial flutter ablation on 04/06/18. Subsequently had marked 1st AV block with progression to junctional rhythm, obesity, HTN, A flutter, A flutter ablation, and gout.  On eliquis PTA. Pharmacy consulted for IV heparin while apixaban on hold got planned ICD placement next week ? ?Last dose of apixaban was 6am 4/4 ?H/H stable ?Heparin drip 1400 uts/hr heparin level 0.2 < goal  ?No bleeding noted, cbc stable, no issues with IV line  per patient  ? ?Goal of Therapy:  ?Heparin level 0.3-0.7 units/ml ?Monitor platelets by anticoagulation protocol: Yes ?  ?Plan:  ?Increase Heparin to 1600 units / hr ?Daily heparin level, CBC ? Resume apixaban after procedures complete next week ? ? ? ?Bonnita Nasuti Pharm.D. CPP, BCPS ?Clinical Pharmacist ?[phone] ?02/18/2022 7:29 AM  ? ? ?Please check AMION for all [place] numbers ?After 10:00 PM, call [place] [phone] ? ? ?

## 2022-02-18 NOTE — Progress Notes (Addendum)
? ?Electrophysiology Rounding Note ? ?Patient Name: Bradley Powell ?Date of Encounter: 02/18/2022 ? ?Primary Cardiologist: None ?Electrophysiologist: Dr. Lovena Le ? ? ?Subjective  ? ?NAEO. No further VT on amiodarone.  ? ?Inpatient Medications  ?  ?Scheduled Meds: ? allopurinol  200 mg Oral Daily  ? Chlorhexidine Gluconate Cloth  6 each Topical Daily  ? metolazone  2.5 mg Oral Once  ? mexiletine  150 mg Oral Q12H  ? pantoprazole  40 mg Oral Daily  ? potassium chloride  40 mEq Oral TID  ? sodium chloride flush  3 mL Intravenous Q12H  ? spironolactone  25 mg Oral Daily  ? ?Continuous Infusions: ? sodium chloride    ? amiodarone 30 mg/hr (02/18/22 0700)  ? furosemide (LASIX) 200 mg in dextrose 5% 100 mL (2mg /mL) infusion 15 mg/hr (02/18/22 0700)  ? heparin 1,400 Units/hr (02/18/22 0700)  ? magnesium sulfate bolus IVPB    ? ?PRN Meds: ?sodium chloride, acetaminophen, ondansetron (ZOFRAN) IV, sodium chloride flush  ? ?Vital Signs  ?  ?Vitals:  ? 02/18/22 0500 02/18/22 0600 02/18/22 0700 02/18/22 0729  ?BP: 102/70 107/80 112/80   ?Pulse: 64 63 73   ?Resp: (!) 30 (!) 28 (!) 23   ?Temp:    98.8 ?F (37.1 ?C)  ?TempSrc:    Oral  ?SpO2: 95% 96% 98%   ?Weight: 129.6 kg     ?Height:      ? ? ?Intake/Output Summary (Last 24 hours) at 02/18/2022 0737 ?Last data filed at 02/18/2022 0700 ?Gross per 24 hour  ?Intake 1725.92 ml  ?Output 6640 ml  ?Net -4914.08 ml  ? ?Filed Weights  ? 02/17/22 0815 02/17/22 1130 02/18/22 0500  ?Weight: 135.3 kg 134.3 kg 129.6 kg  ? ? ?Physical Exam  ?  ?GEN- The patient is well appearing, alert and oriented x 3 today.   ?Head- normocephalic, atraumatic ?Eyes-  Sclera clear, conjunctiva pink ?Ears- hearing intact ?Oropharynx- clear ?Neck- supple ?Lungs- Clear to ausculation bilaterally, normal work of breathing ?Heart- Irregularly irregular rate and rhythm, no murmurs, rubs or gallops ?GI- soft, NT, ND, + BS ?Extremities- no clubbing or cyanosis. 1-2+ edema ?Skin- no rash or lesion ?Psych- euthymic mood,  full affect ?Neuro- strength and sensation are intact ? ?Labs  ?  ?CBC ?Recent Labs  ?  02/15/22 ?1027 02/15/22 ?1051 02/17/22 ?0452 02/18/22 ?0036  ?WBC 6.7   < > 6.5 7.9  ?NEUTROABS 4.9  --   --   --   ?HGB 17.0   < > 15.0 17.0  ?HCT 54.4*   < > 46.3 53.8*  ?MCV 93.0   < > 89.0 90.7  ?PLT 287   < > 218 241  ? < > = values in this interval not displayed.  ? ?Basic Metabolic Panel ?Recent Labs  ?  02/17/22 ?0452 02/17/22 ?1438 02/18/22 ?0036  ?NA 138  --  139  ?K 3.3* 4.2 3.6  ?CL 107  --  104  ?CO2 24  --  24  ?GLUCOSE 96  --  100*  ?BUN 16  --  17  ?CREATININE 1.35*  --  1.48*  ?CALCIUM 7.9*  --  8.4*  ?MG 1.8 1.8 1.7  ? ?Liver Function Tests ?Recent Labs  ?  02/15/22 ?1027  ?AST 35  ?ALT 25  ?ALKPHOS 114  ?BILITOT 2.6*  ?PROT 4.6*  ?ALBUMIN 2.0*  ? ?No results for input(s): LIPASE, AMYLASE in the last 72 hours. ?Cardiac Enzymes ?No results for input(s): CKTOTAL, CKMB, CKMBINDEX, TROPONINI in  the last 72 hours. ? ? ?Telemetry  ?  ?AF with controlled rates 60-70s, no further VT.  (personally reviewed) ? ?Radiology  ?  ?DG Abd 1 View ? ?Result Date: 02/17/2022 ?CLINICAL DATA:  Vomiting. EXAM: ABDOMEN - 1 VIEW COMPARISON:  None. FINDINGS: The bowel gas pattern is normal. No radio-opaque calculi or other significant radiographic abnormality are seen. IMPRESSION: Nonobstructive bowel gas pattern. Electronically Signed   By: Yetta Glassman M.D.   On: 02/17/2022 16:01  ? ?ECHOCARDIOGRAM COMPLETE ? ?Result Date: 02/16/2022 ?   ECHOCARDIOGRAM REPORT   Patient Name:   Bradley Powell [facility] Date of Exam: 02/16/2022 Medical Rec #:  [id number]          Height:       68.0 in Accession #:    [id number]         Weight:       295.0 lb Date of Birth:  Nov 28, 1980         BSA:          2.411 m? Patient Age:    41 years           BP:           130/98 mmHg Patient Gender: M                  HR:           77 bpm. Exam Location:  Inpatient Procedure: 2D Echo, 3D Echo, Cardiac Doppler and Color Doppler Indications:    Congestive Heart Failure  I50.9  History:        Patient has prior history of Echocardiogram examinations, most                 recent 05/12/2021. Arrythmias:Atrial Flutter and PVC; Risk                 Factors:Hypertension. Nonischemic cardiomyopathy.  Sonographer:    Darlina Sicilian RDCS Referring Phys: [phone] AMY D CLEGG IMPRESSIONS  1. Left ventricular ejection fraction, by estimation, is 35 to 40%. Left ventricular ejection fraction by 3D volume is 35 %. The left ventricle has moderately decreased function. The left ventricle demonstrates global hypokinesis. Left ventricular diastolic function could not be evaluated.  2. Right ventricular systolic function is moderately reduced. The right ventricular size is moderately enlarged. There is mildly elevated pulmonary artery systolic pressure.  3. Left atrial size was moderately dilated.  4. Right atrial size was mildly dilated.  5. The pericardial effusion is posterior to the left ventricle.  6. The mitral valve is normal in structure. Mild to moderate mitral valve regurgitation.  7. Tricuspid valve regurgitation is moderate.  8. The aortic valve is tricuspid. There is mild thickening of the aortic valve. Aortic valve regurgitation is not visualized. Aortic valve sclerosis is present, with no evidence of aortic valve stenosis.  9. The inferior vena cava is normal in size with greater than 50% respiratory variability, suggesting right atrial pressure of 3 mmHg. Comparison(s): Prior images reviewed side by side. The left ventricular function is worsened. The right ventricular systolic function is worse. FINDINGS  Left Ventricle: Left ventricular ejection fraction, by estimation, is 35 to 40%. Left ventricular ejection fraction by 3D volume is 35 %. The left ventricle has moderately decreased function. The left ventricle demonstrates global hypokinesis. The left ventricular internal cavity size was normal in size. There is no left ventricular hypertrophy. Left ventricular diastolic function  could not be evaluated due to atrial fibrillation. Left ventricular diastolic  function could not be evaluated. Right Ventricle: The right ventricular size is moderately enlarged. No increase in right ventricular wall thickness. Right ventricular systolic function is moderately reduced. There is mildly elevated pulmonary artery systolic pressure. The tricuspid regurgitant velocity is 2.33 m/s, and with an assumed right atrial pressure of 15 mmHg, the estimated right ventricular systolic pressure is 123XX123 mmHg. Left Atrium: Left atrial size was moderately dilated. Right Atrium: Right atrial size was mildly dilated. Pericardium: Trivial pericardial effusion is present. The pericardial effusion is posterior to the left ventricle. Mitral Valve: The mitral valve is normal in structure. Mild to moderate mitral valve regurgitation, with posteriorly-directed jet. MV peak gradient, 7.7 mmHg. The mean mitral valve gradient is 2.0 mmHg. Tricuspid Valve: The tricuspid valve is normal in structure. Tricuspid valve regurgitation is moderate. Aortic Valve: The aortic valve is tricuspid. There is mild thickening of the aortic valve. Aortic valve regurgitation is not visualized. Aortic valve sclerosis is present, with no evidence of aortic valve stenosis. Pulmonic Valve: The pulmonic valve was normal in structure. Pulmonic valve regurgitation is not visualized. Aorta: The aortic root and ascending aorta are structurally normal, with no evidence of dilitation. Venous: The inferior vena cava is normal in size with greater than 50% respiratory variability, suggesting right atrial pressure of 3 mmHg. IAS/Shunts: There is right bowing of the interatrial septum, suggestive of elevated left atrial pressure. No atrial level shunt detected by color flow Doppler.  LEFT VENTRICLE PLAX 2D LVIDd:         5.30 cm         Diastology LVIDs:         4.00 cm         LV e' medial:  7.66 cm/s LV PW:         0.90 cm         LV e' lateral: 9.44 cm/s LV  IVS:        1.00 cm LVOT diam:     2.10 cm LV SV:         46              3D Volume EF LV SV Index:   19              LV 3D EF:    Left LVOT Area:     3.46 cm?                     ventricul

## 2022-02-18 NOTE — Progress Notes (Addendum)
? ? Advanced Heart Failure Rounding Note ? ?PCP-Cardiologist: None  ? ?Subjective:   ?Admitted after syncope--> WCT and A/C HFrEF.  ?4/6 had recurrent sustained VT w/ rates 200-210, spontaneous converted to NSR w/o intervention. Moved to ICU ? ?Remains on amio gtt, less ectopy overnight. No further VT. Brief NSVT, longest was 6 beats. Occasional PVCs. Currently AFL w/ CVR ?  ? ?6.6L in UOP yesterday w/ lasix gtt. Wt down 11 lb. Still w/ marked fluid overload. SCr 1.4>>1.5  ? ?K 3.6 ?Mg 1.7  ?  ?Feels ok. OOB in chair eating breakfast. No current dyspnea.  ? ?Objective:   ?Weight Range: ?129.6 kg ?Body mass index is 43.44 kg/m?.  ? ?Vital Signs:   ?Temp:  [97.6 ?F (36.4 ?C)-98.2 ?F (36.8 ?C)] 97.9 ?F (36.6 ?C) (04/07 0400) ?Pulse Rate:  [60-94] 73 (04/07 0700) ?Resp:  [9-31] 23 (04/07 0700) ?BP: (96-136)/(64-107) 107/80 (04/07 0600) ?SpO2:  [91 %-100 %] 98 % (04/07 0700) ?Weight:  [129.6 kg-135.3 kg] 129.6 kg (04/07 0500) ?Last BM Date : 02/17/22 ? ?Weight change: ?Filed Weights  ? 02/17/22 0815 02/17/22 1130 02/18/22 0500  ?Weight: 135.3 kg 134.3 kg 129.6 kg  ? ? ?Intake/Output:  ? ?Intake/Output Summary (Last 24 hours) at 02/18/2022 0715 ?Last data filed at 02/18/2022 0700 ?Gross per 24 hour  ?Intake 1725.92 ml  ?Output 6640 ml  ?Net -4914.08 ml  ?  ? ? ?Physical Exam  ?  ?General:  Well appearing. No respiratory difficulty ?HEENT: normal ?Neck: supple. JVD 12 cm. Carotids 2+ bilat; no bruits. No lymphadenopathy or thyromegaly appreciated. ?Cor: PMI nondisplaced. Irregular rhythm. No rubs, gallops or murmurs. ?Lungs: clear ?Abdomen: soft, nontender, nondistended. No hepatosplenomegaly. No bruits or masses. Good bowel sounds. ?Extremities: no cyanosis, clubbing, rash, 2+ b/l LE edema + unna boots ?Neuro: alert & oriented x 3, cranial nerves grossly intact. moves all 4 extremities w/o difficulty. Affect pleasant. ? ? ? ?Telemetry  ? ?AFL w/ CVR 70s w/ occasional PVCs, brief NSVT overnight longest ~6 beats. No further  sustained VT  ? ?EKG  ?  ?N/A ? ?Labs  ?  ?CBC ?Recent Labs  ?  02/15/22 ?1027 02/15/22 ?1051 02/17/22 ?0452 02/18/22 ?0036  ?WBC 6.7   < > 6.5 7.9  ?NEUTROABS 4.9  --   --   --   ?HGB 17.0   < > 15.0 17.0  ?HCT 54.4*   < > 46.3 53.8*  ?MCV 93.0   < > 89.0 90.7  ?PLT 287   < > 218 241  ? < > = values in this interval not displayed.  ? ?Basic Metabolic Panel ?Recent Labs  ?  02/17/22 ?0452 02/17/22 ?1438 02/18/22 ?0036  ?NA 138  --  139  ?K 3.3* 4.2 3.6  ?CL 107  --  104  ?CO2 24  --  24  ?GLUCOSE 96  --  100*  ?BUN 16  --  17  ?CREATININE 1.35*  --  1.48*  ?CALCIUM 7.9*  --  8.4*  ?MG 1.8 1.8 1.7  ? ?Liver Function Tests ?Recent Labs  ?  02/15/22 ?1027  ?AST 35  ?ALT 25  ?ALKPHOS 114  ?BILITOT 2.6*  ?PROT 4.6*  ?ALBUMIN 2.0*  ? ?No results for input(s): LIPASE, AMYLASE in the last 72 hours. ?Cardiac Enzymes ?No results for input(s): CKTOTAL, CKMB, CKMBINDEX, TROPONINI in the last 72 hours. ? ?BNP: ?BNP (last 3 results) ?Recent Labs  ?  06/18/21 ?1218 08/11/21 ?1652 02/15/22 ?1027  ?BNP 315.5* 198.8* 202.1*  ? ? ?  ProBNP (last 3 results) ?No results for input(s): PROBNP in the last 8760 hours. ? ? ?D-Dimer ?No results for input(s): DDIMER in the last 72 hours. ?Hemoglobin A1C ?No results for input(s): HGBA1C in the last 72 hours. ?Fasting Lipid Panel ?No results for input(s): CHOL, HDL, LDLCALC, TRIG, CHOLHDL, LDLDIRECT in the last 72 hours. ?Thyroid Function Tests ?No results for input(s): TSH, T4TOTAL, T3FREE, THYROIDAB in the last 72 hours. ? ?Invalid input(s): FREET3 ? ?Other results: ? ? ?Imaging  ? ? ?DG Abd 1 View ? ?Result Date: 02/17/2022 ?CLINICAL DATA:  Vomiting. EXAM: ABDOMEN - 1 VIEW COMPARISON:  None. FINDINGS: The bowel gas pattern is normal. No radio-opaque calculi or other significant radiographic abnormality are seen. IMPRESSION: Nonobstructive bowel gas pattern. Electronically Signed   By: Yetta Glassman M.D.   On: 02/17/2022 16:01   ? ? ?Medications:   ? ? ?Scheduled Medications: ? allopurinol  200  mg Oral Daily  ? Chlorhexidine Gluconate Cloth  6 each Topical Daily  ? mexiletine  150 mg Oral Q12H  ? pantoprazole  40 mg Oral Daily  ? potassium chloride  40 mEq Oral BID  ? potassium chloride  40 mEq Oral BID  ? sodium chloride flush  3 mL Intravenous Q12H  ? spironolactone  25 mg Oral Daily  ? ? ?Infusions: ? sodium chloride    ? amiodarone 30 mg/hr (02/18/22 0700)  ? furosemide (LASIX) 200 mg in dextrose 5% 100 mL (2mg /mL) infusion 15 mg/hr (02/18/22 0700)  ? heparin 1,400 Units/hr (02/18/22 0700)  ? ? ?PRN Medications: ?sodium chloride, acetaminophen, ondansetron (ZOFRAN) IV, sodium chloride flush ? ? ? ?Patient Profile  ? ?Bradley Powell is a 41 year old with a history of combined systolic/diastolic heart failure, Bradley Powell underwent atrial flutter ablation on 04/06/18. Subsequently had marked 1st AV block with progression to junctional rhythm. obesity, HTN, A flutter, A flutter ablation, and gout.  ?  ?Admitted after syncope--> WCT and A/C HFrEF.  ?  ? ?Assessment/Plan  ? ?1. Syncope--> WCT on admit  ?-EP consulted for ICD.  ?-AA per EP--> Started on mexiletine 150 mg twice a day.   ?-No driving 6 months.  ?-Hold eliquis for now. Continue heparin drip.  ?-Plan ICD once diuresed.  ?  ?2. A/C HFrEF ?ECHO 02/22/2017 EF 40-45% Grade II DD. Peak PA pressure 46 mmhg. Echo 9/18 EF ~20-25%. Echo 01/2018: EF 40-45%  ?- Echo 1/20  EF 35-40% with mild RV dysfunction RVSP 40 mmHG ?- Echo (12/16/19): EF 40-45% Moderate RV dysfunction  ?- cMRI 8/21 EF 45% + infiltrative process suggestive of possible amyloidosis ?- Bradley Powell was seen by Dr. Broadus John for [facility]. Felt to have characteristics for a genetic CM but not to have pathogenic variant for HCM.  ?- cMRI raises concern for TTR amyloid. Genetic testing [id number] "uncertain variant for HCM. Negative for TTR" .  MM panel negative.  ?- PYP 4/22. Ratio 1.26 read as equivocal.  ?- cMRI repeated 09/2021 EF 43% LGE concerning for sarcoid.  ?- Given MRI findings and equivocal PYP suspect  next step will need to consider endomyocardial biopsy. Will need to refer to [facility].   ?Dr Haroldine Laws reached out to Dr Broadus John to further assess for LMNA.  ?- Order screen placed for LMNA. If negative will need to consider empiric treatment of sarcoid.  ?- Remains volume overloaded. Continue Lasix gt at 15/hr + 2.5 of metolazone + aggressive K supp  ?- Continue Spiro 25 mg daily  ?- Continue unna  boots.  ?- No bb with bradycardia.  ?- Renal function stable.  ?  ?3. PAF ?- S/p a flutter ablation 04/06/18 with Dr. Lovena Le. ?- On eliquis prior to admit currently held. Continue heparin drip until all procedures completed.  ?  ?4. OSA ?- Continue CPAP ?  ?5. PVCs  ?On mexilitene.  ~ 10 per hour.  ?  ?6. CKD Stage IIIa ?- Creatinine 1.48 today.  ?- monitor w/ diuresis  ? ?7. Hypomagnesemia/ Hypokalemia ?- Mg 1.7, K 3.6  ?- supp ordered  ?- keep Mg > 2.0 and K > 4.0 ?  ?8. Obesity ? ?Length of Stay: 3 ? ?Lyda Jester, PA-C  ?02/18/2022, 7:15 AM ? ?Advanced Heart Failure Team ?Pager [phone] (M-F; 7a - 5p)  ?Please contact [facility] for night-coverage after hours (5p -7a ) and weekends on [email or web address] ? ?Agree with above ? ?Had sustained VT yesterday ? ?No further VT overnight on IV amio. No significant bradycardia. Diuresing briskly on lasix gtt. Weight down 11 pounds. Breathing better. No orthopnea or PND ? ?General:  Sitting up in bed. No resp difficulty ?HEENT: normal ?Neck: supple. JVP to jaw Carotids 2+ bilat; no bruits. No lymphadenopathy or thryomegaly appreciated. ?Cor: PMI nondisplaced. Irregular rate & rhythm. No rubs, gallops or murmurs. ?Lungs: clear ?Abdomen: obese soft, nontender, nondistended. No hepatosplenomegaly. No bruits or masses. Good bowel sounds. ?Extremities: no cyanosis, clubbing, rash, 1-2+ edema ?Neuro: alert & orientedx3, cranial nerves grossly intact. moves all 4 extremities w/o difficulty. Affect pleasant ? ?Ventricular ectopy suppressed on IV amio and mexilitene. No recurrent  bradycardia. Volume status improving on lasix gtt. Will continue. Supp K and Mag aggressively. ? ?Differential diagnosis includes sarcoid CM vs LMNA. Genetic testing in progress. If LMNA negative would cons

## 2022-02-18 NOTE — Evaluation (Signed)
Physical Therapy Evaluation & Discharge ?Patient Details ?Name: SABAN PEGG ?MRN: [id number] ?DOB: [birth year] ?Today's Date: 02/18/2022 ? ?History of Present Illness ? Pt is a 41 y.o. male who presented 02/15/22 with concern for syncopal episode and was found to have tachycardia treated with diltiazem total of 30 mg in route, and resulting bradycardia. Rapid Response called 4/6 for SVT and was transferred to ICU. Pt also admitted with A/C HFrEF. PMH: combined systolic/diastolic heart failure, s/p atrial flutter ablation on 04/06/18, obesity, NICM, PVC, sleep apnea ?  ?Clinical Impression ? Pt presents with condition above. PTA, he was IND without DME, living alone in a 2-level house with 4-5 STE. Currently, he is functioning at his baseline, displaying WFL gait pattern, balance, strength, and activity tolerance for tasks performed without LOB, UE support, or need for assistance. His HR remained stable in 60s-80s throughout. He does display edema in his legs and report posterior leg tightness though. Educated pt on gentle massage and elevating legs to manage edema and on self stretching his hamstrings and gastrocs using a sheet. Educated pt on limiting sodium/processed foods intake, appropriate fluid intake, weighing self daily, increasing activity, and eating fresh or frozen veggies or rinsing canned veggies. Pt verbalized understanding. All education completed and questions answered. PT will sign off. ?   ? ?Recommendations for follow up therapy are one component of a multi-disciplinary discharge planning process, led by the attending physician.  Recommendations may be updated based on patient status, additional functional criteria and insurance authorization. ? ?Follow Up Recommendations No PT follow up ? ?  ?Assistance Recommended at Discharge None  ?Patient can return home with the following ?  (N/A) ? ?  ?Equipment Recommendations None recommended by PT  ?Recommendations for Other Services ?    ?  ?Functional  Status Assessment Patient has not had a recent decline in their functional status  ? ?  ?Precautions / Restrictions Precautions ?Precautions: Other (comment) ?Precaution Comments: watch HR ?Restrictions ?Weight Bearing Restrictions: No  ? ?  ? ?Mobility ? Bed Mobility ?  ?  ?  ?  ?  ?  ?  ?General bed mobility comments: Pt in chair upon arrival. ?  ? ?Transfers ?Overall transfer level: Modified independent ?Equipment used: None ?  ?  ?  ?  ?  ?  ?  ?General transfer comment: Slightly increased time, but able to complete transfers without assistance or LOB. ?  ? ?Ambulation/Gait ?Ambulation/Gait assistance: Modified independent (Device/Increase time) ?Gait Distance (Feet): 340 Feet ?Assistive device: None ?Gait Pattern/deviations: Step-through pattern, Decreased stride length, Wide base of support ?Gait velocity: WFL ?Gait velocity interpretation: >2.62 ft/sec, indicative of community ambulatory ?  ?General Gait Details: Pt with steady pace and wide BOS, likely due to increased body habitus. No LOB, even with changing directions, head positions, or speeds. ? ?Stairs ?  ?  ?  ?  ?  ? ?Wheelchair Mobility ?  ? ?Modified Rankin (Stroke Patients Only) ?  ? ?  ? ?Balance Overall balance assessment: No apparent balance deficits (not formally assessed) ?  ?  ?  ?  ?  ?  ?  ?  ?  ?  ?  ?  ?  ?  ?  ?  ?  ?  ?   ? ? ? ?Pertinent Vitals/Pain Pain Assessment ?Pain Assessment: Faces ?Faces Pain Scale: Hurts little more ?Pain Location: legs ?Pain Descriptors / Indicators: Discomfort ?Pain Intervention(s): Limited activity within patient's tolerance, Monitored during session, Repositioned, Other (comment) (stretched;  gentle massage for edema)  ? ? ?Home Living Family/patient expects to be discharged to:: Private residence ?Living Arrangements: Alone ?Available Help at Discharge: Family ?Type of Home: House ?Home Access: Stairs to enter ?Entrance Stairs-Rails: Can reach both ?Entrance Stairs-Number of Steps: 4-5 ?Alternate Level  Stairs-Number of Steps: flight ?Home Layout: Two level;Bed/bath upstairs ?Home Equipment: Grab bars - tub/shower;Cane - single Librarian, academic (2 wheels) ?   ?  ?Prior Function Prior Level of Function : Independent/Modified Independent;Driving ?  ?  ?  ?  ?  ?  ?Mobility Comments: Does not use AD ?  ?  ? ? ?Hand Dominance  ?   ? ?  ?Extremity/Trunk Assessment  ? Upper Extremity Assessment ?Upper Extremity Assessment: Overall WFL for tasks assessed ?  ? ?Lower Extremity Assessment ?Lower Extremity Assessment: RLE deficits/detail;LLE deficits/detail ?RLE Deficits / Details: WFL strength; edema noted ?LLE Deficits / Details: WFL strength; edema noted ?  ? ?Cervical / Trunk Assessment ?Cervical / Trunk Assessment: Other exceptions ?Cervical / Trunk Exceptions: increased body habitus  ?Communication  ? Communication: No difficulties  ?Cognition Arousal/Alertness: Awake/alert ?Behavior During Therapy: [facility] for tasks assessed/performed ?Overall Cognitive Status: Within Functional Limits for tasks assessed ?  ?  ?  ?  ?  ?  ?  ?  ?  ?  ?  ?  ?  ?  ?  ?  ?  ?  ?  ? ?  ?General Comments General comments (skin integrity, edema, etc.): Educated pt on gentle massage to manage edema and to elevate legs to manage edema; educated pt on self stretching to hamstrings and gastrocs using a sheet; educated pt on limiting sodium/processed foods intake, appropriate fluid intake, weighing self daily, increasing activity, and eating fresh or frozen veggies or rinsing canned veggies; pt verbalized understanding; HR 60s-80s throughout, no abnormalities or lightheadedness noted ? ?  ?Exercises Other Exercises ?Other Exercises: soft massage to legs to manage edema ?Other Exercises: stretched bil hamstrings and gastrocs and instructed pt on self-stretching with blanket  ? ?Assessment/Plan  ?  ?PT Assessment Patient does not need any further PT services  ?PT Problem List   ? ?   ?  ?PT Treatment Interventions     ? ?PT Goals (Current goals  can be found in the Care Plan section)  ?Acute Rehab PT Goals ?Patient Stated Goal: to get the swelling in his legs down ?PT Goal Formulation: All assessment and education complete, DC therapy ?Time For Goal Achievement: 02/19/22 ?Potential to Achieve Goals: Good ? ?  ?Frequency   ?  ? ? ?Co-evaluation   ?  ?  ?  ?  ? ? ?  ?AM-PAC PT "6 Clicks" Mobility  ?Outcome Measure Help needed turning from your back to your side while in a flat bed without using bedrails?: None ?Help needed moving from lying on your back to sitting on the side of a flat bed without using bedrails?: None ?Help needed moving to and from a bed to a chair (including a wheelchair)?: None ?Help needed standing up from a chair using your arms (e.g., wheelchair or bedside chair)?: None ?Help needed to walk in hospital room?: None ?Help needed climbing 3-5 steps with a railing? : None ?6 Click Score: 24 ? ?  ?End of Session   ?Activity Tolerance: Patient tolerated treatment well ?Patient left: in chair;with call bell/phone within reach ?Nurse Communication: Mobility status ?PT Visit Diagnosis: Other abnormalities of gait and mobility (R26.89) ?  ? ?Time: 6073-7106 ?PT Time Calculation (  min) (ACUTE ONLY): 26 min ? ? ?Charges:   PT Evaluation ?$PT Eval Low Complexity: 1 Low ?PT Treatments ?$Therapeutic Activity: 8-22 mins ?  ?   ? ? ?Raymond Gurney, PT, DPT ?Acute Rehabilitation Services  ?Pager: [phone] ?Office: [phone] ? ? ?Henrene Dodge Pettis ?02/18/2022, 12:57 PM ? ?

## 2022-02-18 NOTE — Progress Notes (Signed)
Orthopedic Tech Progress Note ?Patient Details:  ?Bradley Powell ?1981-10-01 ?[id number] ? ?Ortho Devices ?Type of Ortho Device: Unna boot ?Ortho Device/Splint Location: BLE ?Ortho Device/Splint Interventions: Ordered, Application, Adjustment ?  ?Post Interventions ?Patient Tolerated: Well ?Instructions Provided: Care of device ? ?[name] ?02/18/2022, 5:28 PM ? ?

## 2022-02-19 DIAGNOSIS — R55 Syncope and collapse: Secondary | ICD-10-CM | POA: Diagnosis not present

## 2022-02-19 DIAGNOSIS — I5023 Acute on chronic systolic (congestive) heart failure: Secondary | ICD-10-CM | POA: Diagnosis not present

## 2022-02-19 LAB — BASIC METABOLIC PANEL
Anion gap: 10 (ref 5–15)
BUN: 19 mg/dL (ref 6–20)
CO2: 29 mmol/L (ref 22–32)
Calcium: 8.9 mg/dL (ref 8.9–10.3)
Chloride: 99 mmol/L (ref 98–111)
Creatinine, Ser: 1.64 mg/dL — ABNORMAL HIGH (ref 0.61–1.24)
GFR, Estimated: 55 mL/min — ABNORMAL LOW (ref >60)
Glucose, Bld: 92 mg/dL (ref 70–99)
Potassium: 3.5 mmol/L (ref 3.5–5.1)
Sodium: 138 mmol/L (ref 135–145)

## 2022-02-19 LAB — CBC
HCT: 52.2 % — ABNORMAL HIGH (ref 39.0–52.0)
Hemoglobin: 17.1 g/dL — ABNORMAL HIGH (ref 13.0–17.0)
MCH: 29.1 pg (ref 26.0–34.0)
MCHC: 32.8 g/dL (ref 30.0–36.0)
MCV: 88.8 fL (ref 80.0–100.0)
Platelets: 217 10*3/uL (ref 150–400)
RBC: 5.88 MIL/uL — ABNORMAL HIGH (ref 4.22–5.81)
RDW: 13.2 % (ref 11.5–15.5)
WBC: 8.6 10*3/uL (ref 4.0–10.5)
nRBC: 0 % (ref 0.0–0.2)

## 2022-02-19 LAB — HEPARIN LEVEL (UNFRACTIONATED): Heparin Unfractionated: 0.45 IU/mL (ref 0.30–0.70)

## 2022-02-19 LAB — MAGNESIUM: Magnesium: 1.8 mg/dL (ref 1.7–2.4)

## 2022-02-19 MED ORDER — POTASSIUM CHLORIDE CRYS ER 20 MEQ PO TBCR
40.0000 meq | EXTENDED_RELEASE_TABLET | Freq: Once | ORAL | Status: AC
  Administered 2022-02-19: 40 meq via ORAL
  Filled 2022-02-19: qty 2

## 2022-02-19 MED ORDER — FUROSEMIDE 10 MG/ML IJ SOLN
120.0000 mg | INTRAVENOUS | Status: DC
  Administered 2022-02-20 – 2022-02-24 (×13): 120 mg via INTRAVENOUS
  Filled 2022-02-19: qty 12
  Filled 2022-02-19 (×2): qty 10
  Filled 2022-02-19 (×2): qty 12
  Filled 2022-02-19: qty 10
  Filled 2022-02-19: qty 2
  Filled 2022-02-19: qty 10
  Filled 2022-02-19: qty 12
  Filled 2022-02-19 (×2): qty 10
  Filled 2022-02-19: qty 2
  Filled 2022-02-19: qty 12
  Filled 2022-02-19 (×2): qty 10
  Filled 2022-02-19 (×2): qty 12

## 2022-02-19 MED ORDER — MAGNESIUM SULFATE 2 GM/50ML IV SOLN
2.0000 g | Freq: Once | INTRAVENOUS | Status: AC
  Administered 2022-02-19: 2 g via INTRAVENOUS
  Filled 2022-02-19: qty 50

## 2022-02-19 NOTE — Progress Notes (Signed)
? ? Advanced Heart Failure Rounding Note ? ?PCP-Cardiologist: None  ? ?Subjective:   ?Admitted after syncope--> WCT and A/C HFrEF.  ?4/6 had recurrent sustained VT w/ rates 200-210, spontaneous converted to NSR w/o intervention. Moved to ICU ? ? ?Remains on lasix and amio gtt. No further VT. Diuresing well. Getting tired from having to wake up and pee. Breathing better. No orthopnea or PND. Weight down another 9 pounds (24 pounds total) ? ?Scr 1.48 -> 1.64 K 3.5  ? ?Objective:   ?Weight Range: ?124.5 kg ?Body mass index is 41.73 kg/m?.  ? ?Vital Signs:   ?Temp:  [97.9 ?F (36.6 ?C)-98.7 ?F (37.1 ?C)] 98.3 ?F (36.8 ?C) (04/08 0730) ?Pulse Rate:  [59-84] 72 (04/08 0700) ?Resp:  [15-33] 24 (04/08 0700) ?BP: (89-110)/(54-80) 93/67 (04/08 0700) ?SpO2:  [89 %-100 %] 100 % (04/08 0700) ?Weight:  [124.5 kg] 124.5 kg (04/08 0500) ?Last BM Date : 02/17/22 ? ?Weight change: ?Filed Weights  ? 02/17/22 1130 02/18/22 0500 02/19/22 0500  ?Weight: 134.3 kg 129.6 kg 124.5 kg  ? ? ?Intake/Output:  ? ?Intake/Output Summary (Last 24 hours) at 02/19/2022 1009 ?Last data filed at 02/19/2022 0700 ?Gross per 24 hour  ?Intake 1806.48 ml  ?Output 6785 ml  ?Net -4978.52 ml  ? ?  ? ? ?Physical Exam  ?  ?General:  Sitting up in chair. No resp difficulty ?HEENT: normal ?Neck: supple. JVP to chair  Carotids 2+ bilat; no bruits. No lymphadenopathy or thryomegaly appreciated. ?Cor: PMI nondisplaced. Irregular rate & rhythm. No rubs, gallops or murmurs. ?Lungs: clear ?Abdomen: obese soft, nontender, nondistended. No hepatosplenomegaly. No bruits or masses. Good bowel sounds. ?Extremities: no cyanosis, clubbing, rash, 2-3+ edema + UNNA ?Neuro: alert & orientedx3, cranial nerves grossly intact. moves all 4 extremities w/o difficulty. Affect pleasant ? ?Telemetry  ? ?AFL 60s Personally reviewed ? ?Labs  ?  ?CBC ?Recent Labs  ?  02/18/22 ?0036 02/19/22 ?0113  ?WBC 7.9 8.6  ?HGB 17.0 17.1*  ?HCT 53.8* 52.2*  ?MCV 90.7 88.8  ?PLT 241 217  ? ? ?Basic  Metabolic Panel ?Recent Labs  ?  02/18/22 ?0036 02/19/22 ?0113  ?NA 139 138  ?K 3.6 3.5  ?CL 104 99  ?CO2 24 29  ?GLUCOSE 100* 92  ?BUN 17 19  ?CREATININE 1.48* 1.64*  ?CALCIUM 8.4* 8.9  ?MG 1.7 1.8  ? ? ?Liver Function Tests ?No results for input(s): AST, ALT, ALKPHOS, BILITOT, PROT, ALBUMIN in the last 72 hours. ? ?No results for input(s): LIPASE, AMYLASE in the last 72 hours. ?Cardiac Enzymes ?No results for input(s): CKTOTAL, CKMB, CKMBINDEX, TROPONINI in the last 72 hours. ? ?BNP: ?BNP (last 3 results) ?Recent Labs  ?  06/18/21 ?1218 08/11/21 ?1652 02/15/22 ?1027  ?BNP 315.5* 198.8* 202.1*  ? ? ? ?ProBNP (last 3 results) ?No results for input(s): PROBNP in the last 8760 hours. ? ? ?D-Dimer ?No results for input(s): DDIMER in the last 72 hours. ?Hemoglobin A1C ?No results for input(s): HGBA1C in the last 72 hours. ?Fasting Lipid Panel ?No results for input(s): CHOL, HDL, LDLCALC, TRIG, CHOLHDL, LDLDIRECT in the last 72 hours. ?Thyroid Function Tests ?No results for input(s): TSH, T4TOTAL, T3FREE, THYROIDAB in the last 72 hours. ? ?Invalid input(s): FREET3 ? ?Other results: ? ? ?Imaging  ? ? ?No results found. ? ? ?Medications:   ? ? ?Scheduled Medications: ? allopurinol  200 mg Oral Daily  ? Chlorhexidine Gluconate Cloth  6 each Topical Daily  ? mexiletine  150 mg Oral Q12H  ?  pantoprazole  40 mg Oral Daily  ? potassium chloride  40 mEq Oral TID  ? sodium chloride flush  3 mL Intravenous Q12H  ? spironolactone  25 mg Oral Daily  ? ? ?Infusions: ? sodium chloride    ? amiodarone 30 mg/hr (02/19/22 0700)  ? furosemide (LASIX) 200 mg in dextrose 5% 100 mL (2mg /mL) infusion 15 mg/hr (02/19/22 0700)  ? heparin 1,600 Units/hr (02/19/22 0700)  ? magnesium sulfate bolus IVPB    ? ? ?PRN Medications: ?sodium chloride, acetaminophen, ondansetron (ZOFRAN) IV, sodium chloride flush ? ? ? ?Patient Profile  ? ?Mr Cost is a 41 year old with a history of combined systolic/diastolic heart failure, He underwent atrial flutter  ablation on 04/06/18. Subsequently had marked 1st AV block with progression to junctional rhythm. obesity, HTN, A flutter, A flutter ablation, and gout.  ?  ?Admitted after syncope--> WCT and A/C HFrEF.  ?  ? ?Assessment/Plan  ? ?1. Syncope--> WCT on admit  ?-EP consulted for ICD.  ?-AA per EP--> Started on mexiletine 150 mg twice a day.   ?-No driving 6 months.  ?-Hold eliquis for now. Continue heparin drip.  ?-Plan ICD once diuresed.. Scheduled for Monday ?  ?2. A/C HFrEF ?- Echo 1/20  EF 35-40% with mild RV dysfunction RVSP 40 mmHG ?- Echo (12/16/19): EF 40-45% Moderate RV dysfunction  ?- Echo 4/23 EF 35-40% Moderate RV dysfunction ?- cMRI 8/21 EF 45% + infiltrative process suggestive of possible amyloidosis ?- cMRI repeated 09/2021 EF 43% LGE concerning for sarcoid.  ?- PYP 4/22. Ratio 1.26 read as equivocal.  SPEP negative ?- He was seen by Dr. Broadus John for Genetics Counseling. Felt to have characteristics for a genetic CM but not to have pathogenic variant for HCM.  ?- Case d/w Genetic. DNA sent for full cardiomyopathy panel including LMNA.  If negative will need to consider empiric treatment of sarcoid.  ?- Diuresing well. Remains volume overloaded. Will switch to bolus lasix for his comfort.  ?- Continue Spiro 25 mg daily  ?- Continue unna boots.  ?- No bb with bradycardia.  ?- Renal function slightly worse. Hold metolazone today ?  ?3.Chronic AF/AFL ?- S/p a flutter ablation 04/06/18 with Dr. Lovena Le. ?- On eliquis prior to admit currently held. Continue heparin drip until all procedures completed.  ?  ?4. OSA ?- Continue CPAP ?  ?5. PVCs  ?- On mexilitene at home.  ~ 10 per hour.  ?  ?6. Acute on CKD Stage IIIa ?- Creatinine 1.48 -> 1,64 today.  ?- monitor w/ diuresis  ? ?7. Hypomagnesemia/ Hypokalemia ?- Mg 1.8, K 3.5 ?- supp ordered  ?- keep Mg > 2.0 and K > 4.0 ?  ?8. Obesity ?- Body mass index is 41.73 kg/m?. ? ?Length of Stay: 4 ? ?Glori Bickers, MD  ?02/19/2022, 10:09 AM ? ?Advanced Heart Failure  Team ?Pager [phone] (M-F; 7a - 5p)  ?Please contact [facility] for night-coverage after hours (5p -7a ) and weekends on [email or web address] ? ? ? ? ? ? ? ? ?

## 2022-02-19 NOTE — Progress Notes (Signed)
ANTICOAGULATION CONSULT NOTE - Follow Up Consult ? ?Pharmacy Consult for IV heparin ?Indication: atrial fibrillation ? ?Allergies  ?Allergen Reactions  ? Entresto [Sacubitril-Valsartan] Nausea And Vomiting and Other (See Comments)  ?  Lightheaded, fainting  ? ? ?Patient Measurements: ?Height: 5\' 8"  (172.7 cm) ?Weight: 124.5 kg (274 lb 7.6 oz) ?IBW/kg (Calculated) : 68.4 ?Heparin Dosing Weight: 95.2 kg ? ?Vital Signs: ?Temp: 98.3 ?F (36.8 ?C) (04/08 0730) ?Temp Source: Oral (04/08 0730) ?BP: 93/67 (04/08 0700) ?Pulse Rate: 72 (04/08 0700) ? ?Labs: ?Recent Labs  ?  02/17/22 ?0452 02/18/22 ?0036 02/19/22 ?0113  ?HGB 15.0 17.0 17.1*  ?HCT 46.3 53.8* 52.2*  ?PLT 218 241 217  ?HEPARINUNFRC 0.13* 0.21* 0.45  ?CREATININE 1.35* 1.48* 1.64*  ? ? ? ?Estimated Creatinine Clearance: 78.4 mL/min (A) (by C-G formula based on SCr of 1.64 mg/dL (H)). ? ? ?Medications:  ?Infusions:  ? sodium chloride    ? amiodarone 30 mg/hr (02/19/22 0700)  ? furosemide (LASIX) 200 mg in dextrose 5% 100 mL (2mg /mL) infusion 15 mg/hr (02/19/22 0700)  ? heparin 1,600 Units/hr (02/19/22 0700)  ? magnesium sulfate bolus IVPB    ? ? ?Assessment: ?41 year old with a history of combined systolic/diastolic heart failure, He underwent atrial flutter ablation on 04/06/18.  On eliquis PTA. Pharmacy consulted for IV heparin while apixaban on hold for  planned ICD placement next week ? ?Last dose of apixaban was 6am 4/4 ?H/H stable, pltc stable 200s no bleeding noted ?Heparin drip 1600 uts/hr heparin level 0.45 at goal  ? ? ?Goal of Therapy:  ?Heparin level 0.3-0.7 units/ml ?Monitor platelets by anticoagulation protocol: Yes ?  ?Plan:  ?Continue Heparin to 1600 units / hr ?Daily heparin level, CBC ? Resume apixaban after procedures complete next week ? ? ? ?Bonnita Nasuti Pharm.D. CPP, BCPS ?Clinical Pharmacist ?[phone] ?02/19/2022 9:37 AM  ? ? ?Please check AMION for all [place] numbers ?After 10:00 PM, call [place] [phone] ? ? ?

## 2022-02-20 DIAGNOSIS — R55 Syncope and collapse: Secondary | ICD-10-CM | POA: Diagnosis not present

## 2022-02-20 DIAGNOSIS — I5023 Acute on chronic systolic (congestive) heart failure: Secondary | ICD-10-CM | POA: Diagnosis not present

## 2022-02-20 LAB — CBC
HCT: 55.8 % — ABNORMAL HIGH (ref 39.0–52.0)
Hemoglobin: 17.8 g/dL — ABNORMAL HIGH (ref 13.0–17.0)
MCH: 28.6 pg (ref 26.0–34.0)
MCHC: 31.9 g/dL (ref 30.0–36.0)
MCV: 89.6 fL (ref 80.0–100.0)
Platelets: 269 10*3/uL (ref 150–400)
RBC: 6.23 MIL/uL — ABNORMAL HIGH (ref 4.22–5.81)
RDW: 13.2 % (ref 11.5–15.5)
WBC: 7.1 10*3/uL (ref 4.0–10.5)
nRBC: 0 % (ref 0.0–0.2)

## 2022-02-20 LAB — BASIC METABOLIC PANEL
Anion gap: 13 (ref 5–15)
BUN: 18 mg/dL (ref 6–20)
CO2: 26 mmol/L (ref 22–32)
Calcium: 9 mg/dL (ref 8.9–10.3)
Chloride: 97 mmol/L — ABNORMAL LOW (ref 98–111)
Creatinine, Ser: 1.63 mg/dL — ABNORMAL HIGH (ref 0.61–1.24)
GFR, Estimated: 55 mL/min — ABNORMAL LOW (ref >60)
Glucose, Bld: 66 mg/dL — ABNORMAL LOW (ref 70–99)
Potassium: 3.5 mmol/L (ref 3.5–5.1)
Sodium: 136 mmol/L (ref 135–145)

## 2022-02-20 LAB — HEPARIN LEVEL (UNFRACTIONATED): Heparin Unfractionated: 0.61 IU/mL (ref 0.30–0.70)

## 2022-02-20 LAB — MAGNESIUM: Magnesium: 2.1 mg/dL (ref 1.7–2.4)

## 2022-02-20 MED ORDER — POTASSIUM CHLORIDE CRYS ER 20 MEQ PO TBCR
40.0000 meq | EXTENDED_RELEASE_TABLET | Freq: Once | ORAL | Status: AC
  Administered 2022-02-20: 40 meq via ORAL
  Filled 2022-02-20: qty 2

## 2022-02-20 MED ORDER — METOLAZONE 5 MG PO TABS
5.0000 mg | ORAL_TABLET | Freq: Once | ORAL | Status: AC
  Administered 2022-02-20: 5 mg via ORAL
  Filled 2022-02-20: qty 1

## 2022-02-20 NOTE — Progress Notes (Signed)
ANTICOAGULATION CONSULT NOTE - Follow Up Consult ? ?Pharmacy Consult for IV heparin ?Indication: atrial fibrillation ? ?Allergies  ?Allergen Reactions  ? Entresto [Sacubitril-Valsartan] Nausea And Vomiting and Other (See Comments)  ?  Lightheaded, fainting  ? ? ?Patient Measurements: ?Height: 5\' 8"  (172.7 cm) ?Weight: 123.5 kg (272 lb 4.3 oz) ?IBW/kg (Calculated) : 68.4 ?Heparin Dosing Weight: 95.2 kg ? ?Vital Signs: ?Temp: 98.6 ?F (37 ?C) (04/08 2338) ?Temp Source: Oral (04/08 2338) ?BP: 109/61 (04/09 0700) ?Pulse Rate: 67 (04/09 0700) ? ?Labs: ?Recent Labs  ?  02/18/22 ?0036 02/19/22 ?0113 02/20/22 ?0246  ?HGB 17.0 17.1* 17.8*  ?HCT 53.8* 52.2* 55.8*  ?PLT 241 217 269  ?HEPARINUNFRC 0.21* 0.45 0.61  ?CREATININE 1.48* 1.64* 1.63*  ? ? ? ?Estimated Creatinine Clearance: 78.6 mL/min (A) (by C-G formula based on SCr of 1.63 mg/dL (H)). ? ? ?Medications:  ?Infusions:  ? sodium chloride    ? amiodarone 30 mg/hr (02/20/22 0700)  ? furosemide 62 mL/hr at 02/20/22 0700  ? heparin 1,600 Units/hr (02/20/22 0700)  ? ? ?Assessment: ?41 year old with a history of combined systolic/diastolic heart failure, He underwent atrial flutter ablation on 04/06/18.  On eliquis PTA. Pharmacy consulted for IV heparin while apixaban on hold for  planned ICD placement next week ? ?Last dose of apixaban was 6am 4/4 ?H/H stable, pltc stable 200s no bleeding noted ?Heparin drip 1600 uts/hr heparin level 0.61 at goal  ? ? ?Goal of Therapy:  ?Heparin level 0.3-0.7 units/ml ?Monitor platelets by anticoagulation protocol: Yes ?  ?Plan:  ?Continue Heparin to 1600 units / hr ?Daily heparin level, CBC ?Resume apixaban after procedures complete next week ? ?Thank you for allowing pharmacy to participate in this patient's care. ? ?6/4, PharmD ?PGY1 Pharmacy Resident ?02/20/2022 7:59 AM ?Check [email or web address] for unit specific pharmacy number ? ? ? ?

## 2022-02-20 NOTE — Progress Notes (Signed)
? ? Advanced Heart Failure Rounding Note ? ?PCP-Cardiologist: None  ? ?Subjective:   ?Admitted after syncope--> WCT and A/C HFrEF.  ?4/6 had recurrent sustained VT w/ rates 200-210, spontaneous converted to NSR w/o intervention. Moved to ICU ? ?Lasix gtt switch to intermittent lasix at patient request. Diuresis has slowed some. Weight down 2 pounds. Feeling better. Denies CP, SOB, orthopnea or PND.  ? ? ? ?Objective:   ?Weight Range: ?123.5 kg ?Body mass index is 41.4 kg/m?.  ? ?Vital Signs:   ?Temp:  [98.3 ?F (36.8 ?C)-98.6 ?F (37 ?C)] 98.6 ?F (37 ?C) (04/09 0800) ?Pulse Rate:  [59-82] 67 (04/09 0700) ?Resp:  [15-32] 28 (04/09 0700) ?BP: (91-123)/(56-86) 109/61 (04/09 0700) ?SpO2:  [92 %-98 %] 97 % (04/09 0700) ?Weight:  [123.5 kg] 123.5 kg (04/09 0500) ?Last BM Date : 02/19/22 ? ?Weight change: ?Filed Weights  ? 02/18/22 0500 02/19/22 0500 02/20/22 0500  ?Weight: 129.6 kg 124.5 kg 123.5 kg  ? ? ?Intake/Output:  ? ?Intake/Output Summary (Last 24 hours) at 02/20/2022 1023 ?Last data filed at 02/20/2022 0730 ?Gross per 24 hour  ?Intake 2016.12 ml  ?Output 4500 ml  ?Net -2483.88 ml  ? ? ? ?Physical Exam  ?  ?General:  Sitting in chair No resp difficulty ?HEENT: normal ?Neck: supple.JVP to jaw Carotids 2+ bilat; no bruits. No lymphadenopathy or thryomegaly appreciated. ?Cor: PMI nondisplaced. Irregular rate & rhythm. No rubs, gallops or murmurs. ?Lungs: clear ?Abdomen: obese soft, nontender, nondistended. No hepatosplenomegaly. No bruits or masses. Good bowel sounds. ?Extremities: no cyanosis, clubbing, rash, 2+ edema  + UNNA ?Neuro: alert & orientedx3, cranial nerves grossly intact. moves all 4 extremities w/o difficulty. Affect pleasant ? ?Telemetry  ? ?AFL 60s Personally reviewed ? ?Labs  ?  ?CBC ?Recent Labs  ?  02/19/22 ?0113 02/20/22 ?0246  ?WBC 8.6 7.1  ?HGB 17.1* 17.8*  ?HCT 52.2* 55.8*  ?MCV 88.8 89.6  ?PLT 217 269  ? ? ?Basic Metabolic Panel ?Recent Labs  ?  02/19/22 ?0113 02/20/22 ?0246  ?NA 138 136  ?K 3.5  3.5  ?CL 99 97*  ?CO2 29 26  ?GLUCOSE 92 66*  ?BUN 19 18  ?CREATININE 1.64* 1.63*  ?CALCIUM 8.9 9.0  ?MG 1.8 2.1  ? ? ?Liver Function Tests ?No results for input(s): AST, ALT, ALKPHOS, BILITOT, PROT, ALBUMIN in the last 72 hours. ? ?No results for input(s): LIPASE, AMYLASE in the last 72 hours. ?Cardiac Enzymes ?No results for input(s): CKTOTAL, CKMB, CKMBINDEX, TROPONINI in the last 72 hours. ? ?BNP: ?BNP (last 3 results) ?Recent Labs  ?  06/18/21 ?1218 08/11/21 ?1652 02/15/22 ?1027  ?BNP 315.5* 198.8* 202.1*  ? ? ? ?ProBNP (last 3 results) ?No results for input(s): PROBNP in the last 8760 hours. ? ? ?D-Dimer ?No results for input(s): DDIMER in the last 72 hours. ?Hemoglobin A1C ?No results for input(s): HGBA1C in the last 72 hours. ?Fasting Lipid Panel ?No results for input(s): CHOL, HDL, LDLCALC, TRIG, CHOLHDL, LDLDIRECT in the last 72 hours. ?Thyroid Function Tests ?No results for input(s): TSH, T4TOTAL, T3FREE, THYROIDAB in the last 72 hours. ? ?Invalid input(s): FREET3 ? ?Other results: ? ? ?Imaging  ? ? ?No results found. ? ? ?Medications:   ? ? ?Scheduled Medications: ? allopurinol  200 mg Oral Daily  ? Chlorhexidine Gluconate Cloth  6 each Topical Daily  ? mexiletine  150 mg Oral Q12H  ? pantoprazole  40 mg Oral Daily  ? potassium chloride  40 mEq Oral TID  ? sodium  chloride flush  3 mL Intravenous Q12H  ? spironolactone  25 mg Oral Daily  ? ? ?Infusions: ? sodium chloride    ? amiodarone 30 mg/hr (02/20/22 0700)  ? furosemide 62 mL/hr at 02/20/22 0700  ? heparin 1,600 Units/hr (02/20/22 0700)  ? ? ?PRN Medications: ?sodium chloride, acetaminophen, ondansetron (ZOFRAN) IV, sodium chloride flush ? ? ? ?Patient Profile  ? ?Mr Olivares is a 41 year old with a history of combined systolic/diastolic heart failure, He underwent atrial flutter ablation on 04/06/18. Subsequently had marked 1st AV block with progression to junctional rhythm. obesity, HTN, A flutter, A flutter ablation, and gout.  ?  ?Admitted after  syncope--> WCT and A/C HFrEF.  ?  ? ?Assessment/Plan  ? ?1. Syncope--> WCT on admit  ?-EP consulted for ICD.  ?-AA per EP--> Started on mexiletine 150 mg twice a day.   ?-No driving 6 months.  ?-Holding eliquis for now given need for ICD. Continue heparin drip.  ?-Plan ICD once diuresed.. Scheduled for Monday as schedule permits. D/w EP this am Monday schedule may be full. Will keep NPO and see if it can be done. Otherwise Tuesday ?  ?2. A/C HFrEF ?- Echo 1/20  EF 35-40% with mild RV dysfunction RVSP 40 mmHG ?- Echo (12/16/19): EF 40-45% Moderate RV dysfunction  ?- Echo 4/23 EF 35-40% Moderate RV dysfunction ?- cMRI 8/21 EF 45% + infiltrative process suggestive of possible amyloidosis ?- cMRI repeated 09/2021 EF 43% LGE concerning for sarcoid.  ?- PYP 4/22. Ratio 1.26 read as equivocal.  SPEP negative ?- He was seen by Dr. Broadus John for Genetics Counseling. Felt to have characteristics for a genetic CM but not to have pathogenic variant for HCM.  ?- Case d/w Dr. Broadus John. DNA sent for full cardiomyopathy panel including LMNA.  If negative will need to consider empiric treatment of sarcoid.  ?- Diuresing well. Remains volume overloaded. Switched from lasix gtt to bolus lasix at his request. Will add metolazone ?- Continue Spiro 25 mg daily  ?- Continue unna boots.  ?- No bb with bradycardia.  ?- Renal function stable ?  ?3.Chronic AF/AFL ?- S/p a flutter ablation 04/06/18 with Dr. Lovena Le. ?- On eliquis prior to admit currently held. Continue heparin drip until all procedures completed.  ?  ?4. OSA ?- Continue CPAP ?  ?5. PVCs  ?- On mexilitene at home. Amio added here for VT ?  ?6. Acute on CKD Stage IIIa ?- Creatinine 1.48 -> 1.64 -> 1.63  ?- monitor w/ diuresis  ? ?7. Hypomagnesemia/ Hypokalemia ?- keep Mg > 2.0 and K > 4.0 ?- supp K today ?  ?8. Obesity ?- Body mass index is 41.4 kg/m?. ? ?Length of Stay: 5 ? ?Glori Bickers, MD  ?02/20/2022, 10:23 AM ? ?Advanced Heart Failure Team ?Pager [phone] (M-F; 7a - 5p)  ?Please  contact [facility] for night-coverage after hours (5p -7a ) and weekends on [email or web address] ? ? ? ? ? ? ? ? ?

## 2022-02-21 DIAGNOSIS — I5023 Acute on chronic systolic (congestive) heart failure: Secondary | ICD-10-CM | POA: Diagnosis not present

## 2022-02-21 DIAGNOSIS — R55 Syncope and collapse: Secondary | ICD-10-CM | POA: Diagnosis not present

## 2022-02-21 DIAGNOSIS — I472 Ventricular tachycardia, unspecified: Secondary | ICD-10-CM | POA: Diagnosis not present

## 2022-02-21 LAB — SURGICAL PCR SCREEN
MRSA, PCR: NEGATIVE
Staphylococcus aureus: NEGATIVE

## 2022-02-21 LAB — BASIC METABOLIC PANEL
Anion gap: 13 (ref 5–15)
BUN: 19 mg/dL (ref 6–20)
CO2: 26 mmol/L (ref 22–32)
Calcium: 9.5 mg/dL (ref 8.9–10.3)
Chloride: 97 mmol/L — ABNORMAL LOW (ref 98–111)
Creatinine, Ser: 1.68 mg/dL — ABNORMAL HIGH (ref 0.61–1.24)
GFR, Estimated: 53 mL/min — ABNORMAL LOW (ref >60)
Glucose, Bld: 64 mg/dL — ABNORMAL LOW (ref 70–99)
Potassium: 3.6 mmol/L (ref 3.5–5.1)
Sodium: 136 mmol/L (ref 135–145)

## 2022-02-21 LAB — HEPARIN LEVEL (UNFRACTIONATED)
Heparin Unfractionated: 0.7 IU/mL (ref 0.30–0.70)
Heparin Unfractionated: 0.64 IU/mL (ref 0.30–0.70)

## 2022-02-21 LAB — MAGNESIUM: Magnesium: 1.9 mg/dL (ref 1.7–2.4)

## 2022-02-21 MED ORDER — SODIUM CHLORIDE 0.9% FLUSH
3.0000 mL | Freq: Two times a day (BID) | INTRAVENOUS | Status: DC
  Administered 2022-02-23 – 2022-02-24 (×4): 3 mL via INTRAVENOUS
  Administered 2022-02-25: 10 mL via INTRAVENOUS
  Administered 2022-02-26 – 2022-02-27 (×2): 3 mL via INTRAVENOUS

## 2022-02-21 MED ORDER — CHLORHEXIDINE GLUCONATE 4 % EX LIQD
60.0000 mL | Freq: Once | CUTANEOUS | Status: AC
  Administered 2022-02-21: 4 via TOPICAL
  Filled 2022-02-21: qty 60

## 2022-02-21 MED ORDER — SALINE SPRAY 0.65 % NA SOLN
1.0000 | NASAL | Status: DC | PRN
  Filled 2022-02-21: qty 44

## 2022-02-21 MED ORDER — MAGNESIUM SULFATE 2 GM/50ML IV SOLN
2.0000 g | Freq: Once | INTRAVENOUS | Status: AC
  Administered 2022-02-21: 2 g via INTRAVENOUS
  Filled 2022-02-21: qty 50

## 2022-02-21 MED ORDER — SODIUM CHLORIDE 0.9 % IV SOLN
INTRAVENOUS | Status: DC
Start: 2022-02-22 — End: 2022-02-22

## 2022-02-21 MED ORDER — POTASSIUM CHLORIDE CRYS ER 20 MEQ PO TBCR
40.0000 meq | EXTENDED_RELEASE_TABLET | Freq: Once | ORAL | Status: AC
  Administered 2022-02-21: 40 meq via ORAL
  Filled 2022-02-21: qty 2

## 2022-02-21 MED ORDER — AMIODARONE HCL 200 MG PO TABS
400.0000 mg | ORAL_TABLET | Freq: Two times a day (BID) | ORAL | Status: DC
  Administered 2022-02-21 – 2022-02-27 (×13): 400 mg via ORAL
  Filled 2022-02-21 (×13): qty 2

## 2022-02-21 MED ORDER — CHLORHEXIDINE GLUCONATE 4 % EX LIQD
60.0000 mL | Freq: Once | CUTANEOUS | Status: AC
  Administered 2022-02-22: 4 via TOPICAL
  Filled 2022-02-21: qty 60

## 2022-02-21 MED ORDER — SODIUM CHLORIDE 0.9% FLUSH: 3.0000 mL | INTRAVENOUS | Status: DC | PRN

## 2022-02-21 MED ORDER — SODIUM CHLORIDE 0.9 % IV SOLN: 250.0000 mL | INTRAVENOUS | Status: DC

## 2022-02-21 MED ORDER — CEFAZOLIN SODIUM-DEXTROSE 2-4 GM/100ML-% IV SOLN
2.0000 g | INTRAVENOUS | Status: AC
  Administered 2022-02-22: 2 g via INTRAVENOUS
  Filled 2022-02-21: qty 100

## 2022-02-21 MED ORDER — SODIUM CHLORIDE 0.9 % IV SOLN
80.0000 mg | INTRAVENOUS | Status: AC
  Administered 2022-02-22: 80 mg
  Filled 2022-02-21: qty 2

## 2022-02-21 NOTE — Progress Notes (Signed)
ANTICOAGULATION CONSULT NOTE - Follow Up Consult ? ?Pharmacy Consult for IV heparin ?Indication: atrial fibrillation ? ?Allergies  ?Allergen Reactions  ? Entresto [Sacubitril-Valsartan] Nausea And Vomiting and Other (See Comments)  ?  Lightheaded, fainting  ? ? ?Patient Measurements: ?Height: 5\' 8"  (172.7 cm) ?Weight: 119.1 kg (262 lb 9.1 oz) ?IBW/kg (Calculated) : 68.4 ?Heparin Dosing Weight: 95.2 kg ? ?Vital Signs: ?Temp: 98.7 ?F (37.1 ?C) (04/10 1200) ?Temp Source: Oral (04/10 1200) ?BP: 102/72 (04/10 1000) ?Pulse Rate: 64 (04/10 1000) ? ?Labs: ?Recent Labs  ?  02/19/22 ?0113 02/20/22 ?0246 02/21/22 ?0117 02/21/22 ?[id number] 02/21/22 ?1549  ?HGB 17.1* 17.8*  --   --   --   ?HCT 52.2* 55.8*  --   --   --   ?PLT 217 269  --   --   --   ?HEPARINUNFRC 0.45 0.61  --  0.70 0.64  ?CREATININE 1.64* 1.63* 1.68*  --   --   ? ? ? ?Estimated Creatinine Clearance: 74.8 mL/min (A) (by C-G formula based on SCr of 1.68 mg/dL (H)). ? ? ?Medications:  ?Infusions:  ? sodium chloride    ? furosemide 120 mg (02/21/22 1722)  ? heparin 1,550 Units/hr (02/21/22 0902)  ? ? ?Assessment: ?41 year old with a history of combined systolic/diastolic heart failure, He underwent atrial flutter ablation on 04/06/18.  On eliquis PTA. Pharmacy consulted for IV heparin while apixaban on hold for  planned ICD placement 02/22/22. Last dose of apixaban was 6am 4/4 ? ?Heparin level of 0.64 is therapeutic on heparin 1550 units/hr. Level drawn appropriately.  ? ?Goal of Therapy:  ?Heparin level 0.3-0.7 units/ml ?Monitor platelets by anticoagulation protocol: Yes ?  ?Plan:  ?Decrease heparin to 1500 units / hr to ensure remains in therapeutic range  ?Heparin off at midnight for procedure tomorrow per MD ?Monitor daily CBC and s/s of bleeding  ?Resume apixaban after procedures complete  ? ? ?Cristela Felt, PharmD, BCPS ?Clinical Pharmacist ?02/21/2022 5:59 PM ? ? ? ?

## 2022-02-21 NOTE — Progress Notes (Signed)
ANTICOAGULATION CONSULT NOTE - Follow Up Consult ? ?Pharmacy Consult for IV heparin ?Indication: atrial fibrillation ? ?Allergies  ?Allergen Reactions  ? Entresto [Sacubitril-Valsartan] Nausea And Vomiting and Other (See Comments)  ?  Lightheaded, fainting  ? ? ?Patient Measurements: ?Height: 5\' 8"  (172.7 cm) ?Weight: 119.1 kg (262 lb 9.1 oz) ?IBW/kg (Calculated) : 68.4 ?Heparin Dosing Weight: 95.2 kg ? ?Vital Signs: ?BP: 97/71 (04/10 0600) ?Pulse Rate: 68 (04/10 0700) ? ?Labs: ?Recent Labs  ?  02/19/22 ?0113 02/20/22 ?0246 02/21/22 ?0117 02/21/22 ?[id number]  ?HGB 17.1* 17.8*  --   --   ?HCT 52.2* 55.8*  --   --   ?PLT 217 269  --   --   ?HEPARINUNFRC 0.45 0.61  --  0.70  ?CREATININE 1.64* 1.63* 1.68*  --   ? ? ? ?Estimated Creatinine Clearance: 74.8 mL/min (A) (by C-G formula based on SCr of 1.68 mg/dL (H)). ? ? ?Medications:  ?Infusions:  ? sodium chloride    ? furosemide 62 mL/hr at 02/21/22 0700  ? heparin 1,600 Units/hr (02/21/22 0700)  ? magnesium sulfate bolus IVPB    ? ? ?Assessment: ?41 year old with a history of combined systolic/diastolic heart failure, He underwent atrial flutter ablation on 04/06/18.  On eliquis PTA. Pharmacy consulted for IV heparin while apixaban on hold for  planned ICD placement 02/22/22. ? ?Last dose of apixaban was 6am 4/4 ?H/H stable, pltc stable 200s no bleeding noted ?Heparin drip at 1600 uts/hr heparin level therapeutic at 0.7, but at higher end of range.  ?No issues with bleeding. Will decrease heparin rate slightly given ICD planned for tomorrow. ? ?Goal of Therapy:  ?Heparin level 0.3-0.7 units/ml ?Monitor platelets by anticoagulation protocol: Yes ?  ?Plan:  ?Decrease heparin to 1550 units / hr ?Heparin level in 6 hours ?Daily heparin level, CBC ?Resume apixaban after procedures complete next week ? ?Cathrine Muster, PharmD ?PGY2 Cardiology Pharmacy Resident ?Phone: [phone] ?02/21/2022  8:37 AM ? ?Please check [email or web address] for unit-specific pharmacy phone numbers. ? ? ?Thank you  for allowing pharmacy to participate in this patient's care. ? ? ? ?

## 2022-02-21 NOTE — Progress Notes (Signed)
? ?Progress Note ? ?Patient Name: Bradley Powell ?Date of Encounter: 02/21/2022 ? ?[facility] Cardiologist: Dr. Haroldine Laws ? ?Subjective  ? ?I feel "OK" ? ?Inpatient Medications  ?  ?Scheduled Meds: ? allopurinol  200 mg Oral Daily  ? Chlorhexidine Gluconate Cloth  6 each Topical Daily  ? mexiletine  150 mg Oral Q12H  ? pantoprazole  40 mg Oral Daily  ? potassium chloride  40 mEq Oral TID  ? sodium chloride flush  3 mL Intravenous Q12H  ? spironolactone  25 mg Oral Daily  ? ?Continuous Infusions: ? sodium chloride    ? amiodarone 30 mg/hr (02/21/22 0700)  ? furosemide 62 mL/hr at 02/21/22 0700  ? heparin 1,600 Units/hr (02/21/22 0700)  ? magnesium sulfate bolus IVPB    ? ?PRN Meds: ?sodium chloride, acetaminophen, ondansetron (ZOFRAN) IV, sodium chloride flush  ? ?Vital Signs  ?  ?Vitals:  ? 02/21/22 0400 02/21/22 0500 02/21/22 0600 02/21/22 0700  ?BP:  95/65 97/71   ?Pulse: 61 61 61 68  ?Resp: (!) 30 (!) 31 (!) 36 12  ?Temp:      ?TempSrc:      ?SpO2: 96% 96% 93% 97%  ?Weight:  119.1 kg    ?Height:      ? ? ?Intake/Output Summary (Last 24 hours) at 02/21/2022 0719 ?Last data filed at 02/21/2022 0700 ?Gross per 24 hour  ?Intake 1442.51 ml  ?Output 7025 ml  ?Net -5582.49 ml  ? ? ?  02/21/2022  ?  5:00 AM 02/20/2022  ?  5:00 AM 02/19/2022  ?  5:00 AM  ?Last 3 Weights  ?Weight (lbs) 262 lb 9.1 oz 272 lb 4.3 oz 274 lb 7.6 oz  ?Weight (kg) 119.1 kg 123.5 kg 124.5 kg  ?   ? ?Telemetry  ?  ?AFib, 60's-70's, occ PVCs, rare couplet - Personally Reviewed ? ?ECG  ?  ?No new EKGs - Personally Reviewed ? ?Physical Exam  ? ?GEN: No acute distress.   ?Neck: No JVD ?Cardiac: irreg-irreg, no murmurs, rubs, or gallops.  ?Respiratory: Clear to auscultation bilaterally. ?GI: Soft, nontender, non-distended  ?MS: unna boots, + edema; No deformity. ?Neuro:  Nonfocal  ?Psych: Normal affect  ? ?Labs  ?  ?High Sensitivity Troponin:   ?Recent Labs  ?Lab 02/15/22 ?1027 02/15/22 ?1517  ?TROPONINIHS 24* 225*  ?   ?Chemistry ?Recent Labs  ?Lab  02/15/22 ?1027 02/15/22 ?1051 02/19/22 ?0113 02/20/22 ?0246 02/21/22 ?0117  ?NA 140   < > 138 136 136  ?K 3.8   < > 3.5 3.5 3.6  ?CL 106   < > 99 97* 97*  ?CO2 19*   < > 29 26 26   ?GLUCOSE 168*   < > 92 66* 64*  ?BUN 15   < > 19 18 19   ?CREATININE 1.38*   < > 1.64* 1.63* 1.68*  ?CALCIUM 7.9*   < > 8.9 9.0 9.5  ?MG 1.8   < > 1.8 2.1 1.9  ?PROT 4.6*  --   --   --   --   ?ALBUMIN 2.0*  --   --   --   --   ?AST 35  --   --   --   --   ?ALT 25  --   --   --   --   ?ALKPHOS 114  --   --   --   --   ?BILITOT 2.6*  --   --   --   --   ?GFRNONAA >  60   < > 55* 55* 53*  ?ANIONGAP 15   < > 10 13 13   ? < > = values in this interval not displayed.  ?  ?Lipids No results for input(s): CHOL, TRIG, HDL, LABVLDL, LDLCALC, CHOLHDL in the last 168 hours.  ?Hematology ?Recent Labs  ?Lab 02/18/22 ?0036 02/19/22 ?0113 02/20/22 ?0246  ?WBC 7.9 8.6 7.1  ?RBC 5.93* 5.88* 6.23*  ?HGB 17.0 17.1* 17.8*  ?HCT 53.8* 52.2* 55.8*  ?MCV 90.7 88.8 89.6  ?MCH 28.7 29.1 28.6  ?MCHC 31.6 32.8 31.9  ?RDW 13.4 13.2 13.2  ?PLT 241 217 269  ? ?Thyroid No results for input(s): TSH, FREET4 in the last 168 hours.  ?BNP ?Recent Labs  ?Lab 02/15/22 ?1027  ?BNP 202.1*  ?  ?DDimer No results for input(s): DDIMER in the last 168 hours.  ? ?Radiology  ?  ?No results found. ? ?Cardiac Studies  ? ?Echo 1/20  EF 35-40% with mild RV dysfunction RVSP 40 mmHG ?- Echo (12/16/19): EF 40-45% Moderate RV dysfunction  ?- Echo 4/23 EF 35-40% Moderate RV dysfunction ?- cMRI 8/21 EF 45% + infiltrative process suggestive of possible amyloidosis ?- cMRI repeated 09/2021 EF 43% LGE concerning for sarcoid.  ?- PYP 4/22. Ratio 1.26 read as equivocal.  SPEP negative ? ?Patient Profile  ?   ?41 y.o. male with a history of combined systolic/diastolic heart failure, He underwent atrial flutter ablation on 04/06/18. Subsequently had marked 1st AV block with progression to junctional rhythm. obesity, HTN, AFib, OSA, PVCs, CKD (IIIa), and gout ?  ?Admitted after syncope--> WCT and A/C HFrEF.   ?Recurrent VT and transferred to ICD > amio gtt ? ?Assessment & Plan  ?  ?1.  Syncope with VT ?None on the last 24 hours ?remains IV amiodarone.  ?QRS has been narrow chronically. ?He is described as permanent AF, would not need atrial lead.  ?Continue mexitil. ?Team last week discussed no driving x 6 months.  ?Planned for ICD once diuresed ?Would plan on up-titrating BB post device given h/o bradycardia ? ? ?Pt really prefers Dr. Lovena Le to do his device implant ?Schedule tomorrow is better as well ?Will hold hep gtt at [place] tonight ?Plan for tomorrow ?Transition to PO amiodarone today ? ?  ?2. Combined Systolic/Diastolic Heart Failure ?cMRI 11/22 with LVEF 43% and possible sarcoid.  ?Chronic NYHA II-III symptoms ?  ?Has seen Dr. Broadus John for Genetics Counseling. Felt to have characteristics for a genetic CM but not to have pathogenic variant for HCM.  ?cMRI raised concern for TTR amyloid initially (negative by genetic testing), repeat more suggestive of sarcoid as above.  ?It is felt his mother may have LMNA cardiomyopathy, but testing has been non-diagnostic for both of them. ?Dr. Haroldine Laws discussed  DNA sent for full cardiomyopathy panel including LMNA.  If negative will need to consider empiric treatment of sarcoid.  ? ?3. Pulmonary HTN ?- RV strain on echo.  ?- RHC in 2/21 minimal PAH with low PAPi. ?- Continue CPAP ?  ?4. Morbid Obesity: ?Body mass index is 43.44 kg/m?Marland Kitchen  ?Continue weight loss efforts ?  ?5. PVCs ?Previous monitor only showed 6%, but felt to possibly be contributing to cardiomyopathy.  ?Rare/occ on tele ?  ?6. Longstanding persistent atrial fib/Atrial flutter  ?S/p a flutter ablation 04/06/18 with Dr. Lovena Le. ?EKG 12/16/2019 showed ? Junctional rhythm at 72 bpm ?Subsequent EKGs all show AF with controlled rate.  ?On Eliquis chronically, on hold for procedures >> heparin gtt currently ?CHA2DS2VASC is  at least 2. ? ? ? ? ?For questions or updates, please contact [facility] ?Please consult  [email or web address] for contact info under  ? ?  ?   ?Signed, ?Baldwin Jamaica, PA-C  ?02/21/2022, 7:19 AM   ? ?

## 2022-02-21 NOTE — Plan of Care (Signed)
Progressing

## 2022-02-21 NOTE — Progress Notes (Addendum)
? ? Advanced Heart Failure Rounding Note ? ?PCP-Cardiologist: None  ? ?Subjective:   ?Admitted after syncope--> WCT and A/C HFrEF.  ?4/6 had recurrent sustained VT w/ rates 200-210, spontaneous converted to NSR w/o intervention. Moved to ICU ? ?Brisk diuresis yesterday w/ lasix + metolazone, 7.2L in UOP.  ? ?Wt down 10 lb.   ? ?Scr stable 1.68 ?K 3.6 ?Mg 1.9  ? ?Brief NSVT overnight, 9 beats. No further sustained VT.  ? ?Feeling better. No current dyspnea. Remains edematous.  ? ? ?Objective:   ?Weight Range: ?119.1 kg ?Body mass index is 39.92 kg/m?.  ? ?Vital Signs:   ?Temp:  [98.4 ?F (36.9 ?C)-98.6 ?F (37 ?C)] 98.4 ?F (36.9 ?C) (04/09 2033) ?Pulse Rate:  [61-83] 68 (04/10 0700) ?Resp:  [12-36] 12 (04/10 0700) ?BP: (88-134)/(52-114) 97/71 (04/10 0600) ?SpO2:  [89 %-100 %] 97 % (04/10 0700) ?Weight:  [119.1 kg] 119.1 kg (04/10 0500) ?Last BM Date : 02/20/22 ? ?Weight change: ?Filed Weights  ? 02/19/22 0500 02/20/22 0500 02/21/22 0500  ?Weight: 124.5 kg 123.5 kg 119.1 kg  ? ? ?Intake/Output:  ? ?Intake/Output Summary (Last 24 hours) at 02/21/2022 0832 ?Last data filed at 02/21/2022 0700 ?Gross per 24 hour  ?Intake 1202.51 ml  ?Output 6525 ml  ?Net -5322.49 ml  ? ? ?Physical Exam  ?  ?General:  Well appearing, sitting up in chair. No respiratory difficulty ?HEENT: normal ?Neck: supple. JVD 9 cm. Carotids 2+ bilat; no bruits. No lymphadenopathy or thyromegaly appreciated. ?Cor: PMI nondisplaced. Regular rate & rhythm. No rubs, gallops or murmurs. ?Lungs: clear ?Abdomen: soft, nontender, nondistended. No hepatosplenomegaly. No bruits or masses. Good bowel sounds. ?Extremities: no cyanosis, clubbing, rash, 2+ b/l LE up to thighs, + unna boots  ?Neuro: alert & oriented x 3, cranial nerves grossly intact. moves all 4 extremities w/o difficulty. Affect pleasant. ? ? ?Telemetry  ? ?AFL 60s, NSVT overnight 9 beats Personally reviewed ? ?Labs  ?  ?CBC ?Recent Labs  ?  02/19/22 ?0113 02/20/22 ?0246  ?WBC 8.6 7.1  ?HGB 17.1*  17.8*  ?HCT 52.2* 55.8*  ?MCV 88.8 89.6  ?PLT 217 269  ? ?Basic Metabolic Panel ?Recent Labs  ?  02/20/22 ?0246 02/21/22 ?0117  ?NA 136 136  ?K 3.5 3.6  ?CL 97* 97*  ?CO2 26 26  ?GLUCOSE 66* 64*  ?BUN 18 19  ?CREATININE 1.63* 1.68*  ?CALCIUM 9.0 9.5  ?MG 2.1 1.9  ? ?Liver Function Tests ?No results for input(s): AST, ALT, ALKPHOS, BILITOT, PROT, ALBUMIN in the last 72 hours. ? ?No results for input(s): LIPASE, AMYLASE in the last 72 hours. ?Cardiac Enzymes ?No results for input(s): CKTOTAL, CKMB, CKMBINDEX, TROPONINI in the last 72 hours. ? ?BNP: ?BNP (last 3 results) ?Recent Labs  ?  06/18/21 ?1218 08/11/21 ?1652 02/15/22 ?1027  ?BNP 315.5* 198.8* 202.1*  ? ? ?ProBNP (last 3 results) ?No results for input(s): PROBNP in the last 8760 hours. ? ? ?D-Dimer ?No results for input(s): DDIMER in the last 72 hours. ?Hemoglobin A1C ?No results for input(s): HGBA1C in the last 72 hours. ?Fasting Lipid Panel ?No results for input(s): CHOL, HDL, LDLCALC, TRIG, CHOLHDL, LDLDIRECT in the last 72 hours. ?Thyroid Function Tests ?No results for input(s): TSH, T4TOTAL, T3FREE, THYROIDAB in the last 72 hours. ? ?Invalid input(s): FREET3 ? ?Other results: ? ? ?Imaging  ? ? ?No results found. ? ? ?Medications:   ? ? ?Scheduled Medications: ? allopurinol  200 mg Oral Daily  ? amiodarone  400 mg Oral  BID  ? Chlorhexidine Gluconate Cloth  6 each Topical Daily  ? mexiletine  150 mg Oral Q12H  ? pantoprazole  40 mg Oral Daily  ? potassium chloride  40 mEq Oral TID  ? sodium chloride flush  3 mL Intravenous Q12H  ? sodium chloride flush  3 mL Intravenous Q12H  ? spironolactone  25 mg Oral Daily  ? ? ?Infusions: ? sodium chloride    ? furosemide 62 mL/hr at 02/21/22 0700  ? heparin 1,600 Units/hr (02/21/22 0700)  ? magnesium sulfate bolus IVPB    ? ? ?PRN Medications: ?sodium chloride, acetaminophen, ondansetron (ZOFRAN) IV, sodium chloride flush ? ? ? ?Patient Profile  ? ?Bradley Powell is a 41 year old with a history of combined  systolic/diastolic heart failure, He underwent atrial flutter ablation on 04/06/18. Subsequently had marked 1st AV block with progression to junctional rhythm. obesity, HTN, A flutter, A flutter ablation, and gout.  ?  ?Admitted after syncope--> WCT and A/C HFrEF.  ?  ? ?Assessment/Plan  ? ?1. Syncope--> WCT on admit  ?-EP consulted for ICD.  ?-AA per EP--> Started on mexiletine 150 mg twice a day.   ?-No driving 6 months.  ?-Holding eliquis for now given need for ICD. Continue heparin drip.  ?-Plan ICD once diuresed. D/w EP, plan implant tomorrow  ?  ?2. A/C HFrEF ?- Echo 1/20  EF 35-40% with mild RV dysfunction RVSP 40 mmHG ?- Echo (12/16/19): EF 40-45% Moderate RV dysfunction  ?- Echo 4/23 EF 35-40% Moderate RV dysfunction ?- cMRI 8/21 EF 45% + infiltrative process suggestive of possible amyloidosis ?- cMRI repeated 09/2021 EF 43% LGE concerning for sarcoid.  ?- PYP 4/22. Ratio 1.26 read as equivocal.  SPEP negative ?- He was seen by Dr. Broadus John for Genetics Counseling. Felt to have characteristics for a genetic CM but not to have pathogenic variant for HCM.  ?- Case d/w Dr. Broadus John. DNA sent for full cardiomyopathy panel including LMNA.  If negative will need to consider empiric treatment of sarcoid.  ?- Diuresing well. Remains volume overloaded. Continue IV Lasix 120 mg tid + 5 mg of metolazone  ?- Continue Spiro 25 mg daily  ?- Continue unna boots.  ?- No bb with bradycardia.  ?- Renal function stable ?  ?3.Chronic AF/AFL ?- S/p a flutter ablation 04/06/18 with Dr. Lovena Le. ?- On eliquis prior to admit currently held. Continue heparin drip until all procedures completed.  ?  ?4. OSA ?- Continue CPAP ?  ?5. PVCs  ?- On mexilitene at home. Amio added here for VT ?  ?6. Acute on CKD Stage IIIa ?- Creatinine 1.48 -> 1.64 -> 1.63->1.68  ?- monitor w/ diuresis  ? ?7. Hypomagnesemia/ Hypokalemia ?- keep Mg > 2.0 and K > 4.0 ?- supp K today ?  ?8. Obesity ?- Body mass index is 39.92 kg/m?. ? ?Length of Stay: 6 ? ?Bradley Jester, PA-C  ?02/21/2022, 8:32 AM ? ?Advanced Heart Failure Team ?Pager [phone] (M-F; 7a - 5p)  ?Please contact [facility] for night-coverage after hours (5p -7a ) and weekends on [email or web address] ? ?Patient seen and examined with the above-signed Advanced Practice Provider and/or Housestaff. I personally reviewed laboratory data, imaging studies and relevant notes. I independently examined the patient and formulated the important aspects of the plan. I have edited the note to reflect any of my changes or salient points. I have personally discussed the plan with the patient and/or family. ? ?Diuresing well on IV lasix and metolazone. Weight  down 10 more pounds. Denies CP, SOB, orthopnea or PND. Brief NSVT on IV amio ? ?General:  Sitting up No resp difficulty ?HEENT: normal ?Neck: supple. JVP to jaw. Carotids 2+ bilat; no bruits. No lymphadenopathy or thryomegaly appreciated. ?Cor: PMI nondisplaced. Irregular rate & rhythm. No rubs, gallops or murmurs. ?Lungs: clear ?Abdomen: obese soft, nontender, nondistended. No hepatosplenomegaly. No bruits or masses. Good bowel sounds. ?Extremities: no cyanosis, clubbing, rash, 2-3+ edema ?Neuro: alert & orientedx3, cranial nerves grossly intact. moves all 4 extremities w/o difficulty. Affect pleasant ? ?Still with marked volume overload. Continue IV diuresis. Switch amio to po per EP. ICD tomorrow. Await genetic testing. Watch renal function and electrolytes closely. Continue heparin.  ? ?Glori Bickers, MD  ?9:28 AM ? ? ? ? ? ? ?

## 2022-02-22 ENCOUNTER — Other Ambulatory Visit: Payer: Self-pay | Admitting: Cardiology

## 2022-02-22 ENCOUNTER — Encounter (HOSPITAL_COMMUNITY)
Admission: EM | Disposition: A | Discharge status: Home / Self Care | Payer: Self-pay | Source: Home / Self Care | Attending: Internal Medicine

## 2022-02-22 DIAGNOSIS — I428 Other cardiomyopathies: Secondary | ICD-10-CM

## 2022-02-22 DIAGNOSIS — I472 Ventricular tachycardia, unspecified: Secondary | ICD-10-CM

## 2022-02-22 HISTORY — PX: ICD IMPLANT: EP1208

## 2022-02-22 LAB — CBC
HCT: 51.5 % (ref 39.0–52.0)
Hemoglobin: 17.1 g/dL — ABNORMAL HIGH (ref 13.0–17.0)
MCH: 29.3 pg (ref 26.0–34.0)
MCHC: 33.2 g/dL (ref 30.0–36.0)
MCV: 88.2 fL (ref 80.0–100.0)
Platelets: 250 10*3/uL (ref 150–400)
RBC: 5.84 MIL/uL — ABNORMAL HIGH (ref 4.22–5.81)
RDW: 13.2 % (ref 11.5–15.5)
WBC: 7.7 10*3/uL (ref 4.0–10.5)
nRBC: 0 % (ref 0.0–0.2)

## 2022-02-22 LAB — BASIC METABOLIC PANEL
Anion gap: 13 (ref 5–15)
BUN: 24 mg/dL — ABNORMAL HIGH (ref 6–20)
CO2: 26 mmol/L (ref 22–32)
Calcium: 9.4 mg/dL (ref 8.9–10.3)
Chloride: 95 mmol/L — ABNORMAL LOW (ref 98–111)
Creatinine, Ser: 1.67 mg/dL — ABNORMAL HIGH (ref 0.61–1.24)
GFR, Estimated: 53 mL/min — ABNORMAL LOW (ref >60)
Glucose, Bld: 67 mg/dL — ABNORMAL LOW (ref 70–99)
Potassium: 4.4 mmol/L (ref 3.5–5.1)
Sodium: 134 mmol/L — ABNORMAL LOW (ref 135–145)

## 2022-02-22 LAB — MAGNESIUM: Magnesium: 2 mg/dL (ref 1.7–2.4)

## 2022-02-22 SURGERY — ICD IMPLANT

## 2022-02-22 MED ORDER — GENTAMICIN SULFATE 40 MG/ML IJ SOLN
INTRAMUSCULAR | Status: AC
  Filled 2022-02-22: qty 2

## 2022-02-22 MED ORDER — FENTANYL CITRATE (PF) 100 MCG/2ML IJ SOLN
INTRAMUSCULAR | Status: AC
  Filled 2022-02-22: qty 2

## 2022-02-22 MED ORDER — MIDAZOLAM HCL 5 MG/5ML IJ SOLN
INTRAMUSCULAR | Status: DC | PRN
Start: 2022-02-22 — End: 2022-02-22
  Administered 2022-02-22: 2 mg via INTRAVENOUS
  Administered 2022-02-22 (×2): 1 mg via INTRAVENOUS

## 2022-02-22 MED ORDER — CEFAZOLIN SODIUM-DEXTROSE 1-4 GM/50ML-% IV SOLN
1.0000 g | Freq: Four times a day (QID) | INTRAVENOUS | Status: AC
  Administered 2022-02-22 – 2022-02-23 (×3): 1 g via INTRAVENOUS
  Filled 2022-02-22 (×4): qty 50

## 2022-02-22 MED ORDER — LIDOCAINE HCL (PF) 1 % IJ SOLN
INTRAMUSCULAR | Status: AC
  Filled 2022-02-22: qty 30

## 2022-02-22 MED ORDER — MIDAZOLAM HCL 5 MG/5ML IJ SOLN
INTRAMUSCULAR | Status: AC
  Filled 2022-02-22: qty 5

## 2022-02-22 MED ORDER — HEPARIN (PORCINE) IN NACL 1000-0.9 UT/500ML-% IV SOLN
INTRAVENOUS | Status: AC
  Filled 2022-02-22: qty 500

## 2022-02-22 MED ORDER — CEFAZOLIN SODIUM-DEXTROSE 2-4 GM/100ML-% IV SOLN
INTRAVENOUS | Status: AC
  Filled 2022-02-22: qty 100

## 2022-02-22 MED ORDER — ONDANSETRON HCL 4 MG/2ML IJ SOLN: 4.0000 mg | Freq: Four times a day (QID) | INTRAMUSCULAR | Status: DC | PRN

## 2022-02-22 MED ORDER — HEPARIN (PORCINE) IN NACL 1000-0.9 UT/500ML-% IV SOLN
INTRAVENOUS | Status: DC | PRN
  Administered 2022-02-22: 500 mL

## 2022-02-22 MED ORDER — ACETAMINOPHEN 325 MG PO TABS: 325.0000 mg | ORAL_TABLET | ORAL | Status: DC | PRN

## 2022-02-22 MED ORDER — LIDOCAINE HCL (PF) 1 % IJ SOLN
INTRAMUSCULAR | Status: DC | PRN
  Administered 2022-02-22: 60 mL

## 2022-02-22 MED ORDER — FENTANYL CITRATE (PF) 100 MCG/2ML IJ SOLN
INTRAMUSCULAR | Status: DC | PRN
  Administered 2022-02-22 (×2): 12.5 ug via INTRAVENOUS
  Administered 2022-02-22: 25 ug via INTRAVENOUS

## 2022-02-22 SURGICAL SUPPLY — 6 items
CABLE SURGICAL S-101-97-12 (CABLE) ×2 IMPLANT
ICD MOMENTUM D120 (ICD Generator) ×1 IMPLANT
LEAD RELIANCE 0138-64 (Lead) ×1 IMPLANT
PAD DEFIB RADIO PHYSIO CONN (PAD) ×2 IMPLANT
SHEATH 9FR PRELUDE SNAP 13 (SHEATH) ×1 IMPLANT
TRAY PACEMAKER INSERTION (PACKS) ×2 IMPLANT

## 2022-02-22 NOTE — H&P (View-Only) (Signed)
? ?Progress Note ? ?Patient Name: Bradley Powell ?Date of Encounter: 02/22/2022 ? ?[facility] Cardiologist: Dr. Haroldine Laws ? ?Subjective  ? ?NO new complaints, no rest SOB, no CP ? ?Inpatient Medications  ?  ?Scheduled Meds: ? allopurinol  200 mg Oral Daily  ? amiodarone  400 mg Oral BID  ? chlorhexidine  60 mL Topical Once  ? Chlorhexidine Gluconate Cloth  6 each Topical Daily  ? gentamicin irrigation  80 mg Irrigation On Call  ? mexiletine  150 mg Oral Q12H  ? pantoprazole  40 mg Oral Daily  ? potassium chloride  40 mEq Oral TID  ? sodium chloride flush  3 mL Intravenous Q12H  ? sodium chloride flush  3 mL Intravenous Q12H  ? spironolactone  25 mg Oral Daily  ? ?Continuous Infusions: ? sodium chloride    ? sodium chloride 50 mL/hr at 02/22/22 0720  ? sodium chloride    ?  ceFAZolin (ANCEF) IV    ? furosemide 62 mL/hr at 02/22/22 0700  ? ?PRN Meds: ?sodium chloride, acetaminophen, ondansetron (ZOFRAN) IV, sodium chloride, sodium chloride flush, sodium chloride flush  ? ?Vital Signs  ?  ?Vitals:  ? 02/22/22 0500 02/22/22 0600 02/22/22 0700 02/22/22 0720  ?BP: 96/62 94/66 104/68   ?Pulse: 60 65 63   ?Resp: (!) 30 (!) 26 15   ?Temp:    97.9 ?F (36.6 ?C)  ?TempSrc:      ?SpO2: 95% 98% 96%   ?Weight: 116.4 kg     ?Height:      ? ? ?Intake/Output Summary (Last 24 hours) at 02/22/2022 0754 ?Last data filed at 02/22/2022 0720 ?Gross per 24 hour  ?Intake 1257.79 ml  ?Output 7550 ml  ?Net -6292.21 ml  ? ? ?  02/22/2022  ?  5:00 AM 02/21/2022  ?  5:00 AM 02/20/2022  ?  5:00 AM  ?Last 3 Weights  ?Weight (lbs) 256 lb 9.9 oz 262 lb 9.1 oz 272 lb 4.3 oz  ?Weight (kg) 116.4 kg 119.1 kg 123.5 kg  ?   ? ?Telemetry  ?  ?AFib, 60's-70's, occ PVCs, rare couplet/3 beat salvo - Personally Reviewed ? ?ECG  ?  ?No new EKGs - Personally Reviewed ? ?Physical Exam  ? ?Essentially unchanged ?GEN: No acute distress.   ?Neck: No JVD ?Cardiac: irreg-irreg, no murmurs, rubs, or gallops.  ?Respiratory: Clear to auscultation bilaterally. ?GI: Soft,  nontender, non-distended  ?MS: unna boots, + edema; No deformity. ?Neuro:  Nonfocal  ?Psych: Normal affect  ? ?Labs  ?  ?High Sensitivity Troponin:   ?Recent Labs  ?Lab 02/15/22 ?1027 02/15/22 ?1517  ?TROPONINIHS 24* 225*  ?   ?Chemistry ?Recent Labs  ?Lab 02/15/22 ?1027 02/15/22 ?1051 02/20/22 ?0246 02/21/22 ?0117 02/22/22 ?0038  ?NA 140   < > 136 136 134*  ?K 3.8   < > 3.5 3.6 4.4  ?CL 106   < > 97* 97* 95*  ?CO2 19*   < > 26 26 26   ?GLUCOSE 168*   < > 66* 64* 67*  ?BUN 15   < > 18 19 24*  ?CREATININE 1.38*   < > 1.63* 1.68* 1.67*  ?CALCIUM 7.9*   < > 9.0 9.5 9.4  ?MG 1.8   < > 2.1 1.9 2.0  ?PROT 4.6*  --   --   --   --   ?ALBUMIN 2.0*  --   --   --   --   ?AST 35  --   --   --   --   ?  ALT 25  --   --   --   --   ?ALKPHOS 114  --   --   --   --   ?BILITOT 2.6*  --   --   --   --   ?GFRNONAA >60   < > 55* 53* 53*  ?ANIONGAP 15   < > 13 13 13   ? < > = values in this interval not displayed.  ?  ?Lipids No results for input(s): CHOL, TRIG, HDL, LABVLDL, LDLCALC, CHOLHDL in the last 168 hours.  ?Hematology ?Recent Labs  ?Lab 02/19/22 ?0113 02/20/22 ?0246 02/22/22 ?0038  ?WBC 8.6 7.1 7.7  ?RBC 5.88* 6.23* 5.84*  ?HGB 17.1* 17.8* 17.1*  ?HCT 52.2* 55.8* 51.5  ?MCV 88.8 89.6 88.2  ?MCH 29.1 28.6 29.3  ?MCHC 32.8 31.9 33.2  ?RDW 13.2 13.2 13.2  ?PLT 217 269 250  ? ?Thyroid No results for input(s): TSH, FREET4 in the last 168 hours.  ?BNP ?Recent Labs  ?Lab 02/15/22 ?1027  ?BNP 202.1*  ?  ?DDimer No results for input(s): DDIMER in the last 168 hours.  ? ?Radiology  ?  ?No results found. ? ?Cardiac Studies  ? ?Echo 1/20  EF 35-40% with mild RV dysfunction RVSP 40 mmHG ?- Echo (12/16/19): EF 40-45% Moderate RV dysfunction  ?- Echo 4/23 EF 35-40% Moderate RV dysfunction ?- cMRI 8/21 EF 45% + infiltrative process suggestive of possible amyloidosis ?- cMRI repeated 09/2021 EF 43% LGE concerning for sarcoid.  ?- PYP 4/22. Ratio 1.26 read as equivocal.  SPEP negative ? ?Patient Profile  ?   ?40 y.o. male with a history of combined  systolic/diastolic heart failure, He underwent atrial flutter ablation on 04/06/18. Subsequently had marked 1st AV block with progression to junctional rhythm. obesity, HTN, AFib, OSA, PVCs, CKD (IIIa), and gout ?  ?Admitted after syncope--> WCT and A/C HFrEF.  ?Recurrent VT and transferred to ICD > amio gtt ?4/10 transitioned to PO amio ? ?Assessment & Plan  ?  ?1.  Syncope with VT ?None on the last 24 hours ?QRS has been narrow chronically. ?He is described as permanent AF, would not need atrial lead.  ?Continue mexitil. ?Transitioned to PO amiodarone yesterday ?Team last week discussed no driving x 6 months.  ?Would plan on up-titrating BB post device given h/o bradycardia ? ?For ICD implant today ?Dr. Lovena Le has seen the patient, answered follow up questions, he remains agreeable ? ?  ?2. Combined Systolic/Diastolic Heart Failure ?cMRI 11/22 with LVEF 43% and possible sarcoid.  ?Chronic NYHA II-III symptoms ?  ?Has seen Dr. Broadus John for Genetics Counseling. Felt to have characteristics for a genetic CM but not to have pathogenic variant for HCM.  ?cMRI raised concern for TTR amyloid initially (negative by genetic testing), repeat more suggestive of sarcoid as above.  ?It is felt his mother may have LMNA cardiomyopathy, but testing has been non-diagnostic for both of them. ?Dr. Haroldine Laws discussed  DNA sent for full cardiomyopathy panel including LMNA.  If negative will need to consider empiric treatment of sarcoid.  ? ?Has had marked diuresis ?Labs stable ?C/w HF team ? ?3. Pulmonary HTN ?- RV strain on echo.  ?- RHC in 2/21 minimal PAH with low PAPi. ?- Continue CPAP ?  ?4. Morbid Obesity: ?Body mass index is 43.44 kg/m?Marland Kitchen  ?Continue weight loss efforts ?  ?5. PVCs ?Previous monitor only showed 6%, but felt to possibly be contributing to cardiomyopathy.  ?occ on tele ?  ?6. Longstanding persistent atrial  fib/Atrial flutter  ?S/p a flutter ablation 04/06/18 with Dr. Lovena Le. ?EKG 12/16/2019 showed ? Junctional rhythm at  72 bpm ?Subsequent EKGs all show AF with controlled rate.  ?On Eliquis chronically, on hold for procedures >> heparin gtt currently ?CHA2DS2VASC is at least 2. ? ? ?For questions or updates, please contact [place] ?Please consult [email or web address] for contact info under  ? ?Signed, ?Baldwin Jamaica, PA-C  ?02/22/2022, 7:54 AM   ? ?EP Attending ? ?Patient seen and examined. Agree with the findings as noted above. The patient is doing well this morning. VT has been quiet and he is off IV amiodarone. He has had a very large diuresis and his weight is down. I have reviewed the indications/risks/benefits/goals/expectations of ICD insertion and he wishes to proceed. ? ?Salome Spotted ? ?

## 2022-02-22 NOTE — Discharge Instructions (Addendum)
? ? ?  Supplemental Discharge Instructions for  ?Pacemaker/Defibrillator Patients ? ?Activity ?No heavy lifting or vigorous activity with your left/right arm for 6 to 8 weeks.  Do not raise your left/right arm above your head for one week.  Gradually raise your affected arm as drawn below. ? ?        ?   02/27/22                    02/28/22                    03/01/22                  03/02/22 ?__ ? ?NO DRIVING for 6 months. ? ?WOUND CARE ?Keep the wound area clean and dry.  Do not get this area wet , no showers until cleared to at your wound check visit ?The tape/steri-strips on your wound will fall off; do not pull them off.  No bandage is needed on the site.  DO  NOT apply any creams, oils, or ointments to the wound area. ?If you notice any drainage or discharge from the wound, any swelling or bruising at the site, or you develop a fever > 101? F after you are discharged home, call the office at once. ? ?Special Instructions ?You are still able to use cellular telephones; use the ear opposite the side where you have your pacemaker/defibrillator.  Avoid carrying your cellular phone near your device. ?When traveling through airports, show security personnel your identification card to avoid being screened in the metal detectors.  Ask the security personnel to use the hand wand. ?Avoid arc welding equipment, MRI testing (magnetic resonance imaging), TENS units (transcutaneous nerve stimulators).  Call the office for questions about other devices. ?Avoid electrical appliances that are in poor condition or are not properly grounded. ?Microwave ovens are safe to be near or to operate. ? ?Additional information for defibrillator patients should your device go off: ?If your device goes off ONCE and you feel fine afterward, notify the device clinic nurses. ?If your device goes off ONCE and you do not feel well afterward, call 911. ?If your device goes off TWICE, call 911. ?If your device goes off THREE times in one day, call  911. ? ?DO NOT DRIVE YOURSELF OR A FAMILY MEMBER ?WITH A DEFIBRILLATOR TO THE HOSPITAL--CALL 911. ? ?

## 2022-02-22 NOTE — Progress Notes (Addendum)
? ?Progress Note ? ?Patient Name: Bradley Powell ?Date of Encounter: 02/22/2022 ? ?[facility] Cardiologist: Dr. Haroldine Laws ? ?Subjective  ? ?NO new complaints, no rest SOB, no CP ? ?Inpatient Medications  ?  ?Scheduled Meds: ? allopurinol  200 mg Oral Daily  ? amiodarone  400 mg Oral BID  ? chlorhexidine  60 mL Topical Once  ? Chlorhexidine Gluconate Cloth  6 each Topical Daily  ? gentamicin irrigation  80 mg Irrigation On Call  ? mexiletine  150 mg Oral Q12H  ? pantoprazole  40 mg Oral Daily  ? potassium chloride  40 mEq Oral TID  ? sodium chloride flush  3 mL Intravenous Q12H  ? sodium chloride flush  3 mL Intravenous Q12H  ? spironolactone  25 mg Oral Daily  ? ?Continuous Infusions: ? sodium chloride    ? sodium chloride 50 mL/hr at 02/22/22 0720  ? sodium chloride    ?  ceFAZolin (ANCEF) IV    ? furosemide 62 mL/hr at 02/22/22 0700  ? ?PRN Meds: ?sodium chloride, acetaminophen, ondansetron (ZOFRAN) IV, sodium chloride, sodium chloride flush, sodium chloride flush  ? ?Vital Signs  ?  ?Vitals:  ? 02/22/22 0500 02/22/22 0600 02/22/22 0700 02/22/22 0720  ?BP: 96/62 94/66 104/68   ?Pulse: 60 65 63   ?Resp: (!) 30 (!) 26 15   ?Temp:    97.9 ?F (36.6 ?C)  ?TempSrc:      ?SpO2: 95% 98% 96%   ?Weight: 116.4 kg     ?Height:      ? ? ?Intake/Output Summary (Last 24 hours) at 02/22/2022 0754 ?Last data filed at 02/22/2022 0720 ?Gross per 24 hour  ?Intake 1257.79 ml  ?Output 7550 ml  ?Net -6292.21 ml  ? ? ?  02/22/2022  ?  5:00 AM 02/21/2022  ?  5:00 AM 02/20/2022  ?  5:00 AM  ?Last 3 Weights  ?Weight (lbs) 256 lb 9.9 oz 262 lb 9.1 oz 272 lb 4.3 oz  ?Weight (kg) 116.4 kg 119.1 kg 123.5 kg  ?   ? ?Telemetry  ?  ?AFib, 60's-70's, occ PVCs, rare couplet/3 beat salvo - Personally Reviewed ? ?ECG  ?  ?No new EKGs - Personally Reviewed ? ?Physical Exam  ? ?Essentially unchanged ?GEN: No acute distress.   ?Neck: No JVD ?Cardiac: irreg-irreg, no murmurs, rubs, or gallops.  ?Respiratory: Clear to auscultation bilaterally. ?GI: Soft,  nontender, non-distended  ?MS: unna boots, + edema; No deformity. ?Neuro:  Nonfocal  ?Psych: Normal affect  ? ?Labs  ?  ?High Sensitivity Troponin:   ?Recent Labs  ?Lab 02/15/22 ?1027 02/15/22 ?1517  ?TROPONINIHS 24* 225*  ?   ?Chemistry ?Recent Labs  ?Lab 02/15/22 ?1027 02/15/22 ?1051 02/20/22 ?0246 02/21/22 ?0117 02/22/22 ?0038  ?NA 140   < > 136 136 134*  ?K 3.8   < > 3.5 3.6 4.4  ?CL 106   < > 97* 97* 95*  ?CO2 19*   < > 26 26 26   ?GLUCOSE 168*   < > 66* 64* 67*  ?BUN 15   < > 18 19 24*  ?CREATININE 1.38*   < > 1.63* 1.68* 1.67*  ?CALCIUM 7.9*   < > 9.0 9.5 9.4  ?MG 1.8   < > 2.1 1.9 2.0  ?PROT 4.6*  --   --   --   --   ?ALBUMIN 2.0*  --   --   --   --   ?AST 35  --   --   --   --   ?  ALT 25  --   --   --   --   ?ALKPHOS 114  --   --   --   --   ?BILITOT 2.6*  --   --   --   --   ?GFRNONAA >60   < > 55* 53* 53*  ?ANIONGAP 15   < > 13 13 13   ? < > = values in this interval not displayed.  ?  ?Lipids No results for input(s): CHOL, TRIG, HDL, LABVLDL, LDLCALC, CHOLHDL in the last 168 hours.  ?Hematology ?Recent Labs  ?Lab 02/19/22 ?0113 02/20/22 ?0246 02/22/22 ?0038  ?WBC 8.6 7.1 7.7  ?RBC 5.88* 6.23* 5.84*  ?HGB 17.1* 17.8* 17.1*  ?HCT 52.2* 55.8* 51.5  ?MCV 88.8 89.6 88.2  ?MCH 29.1 28.6 29.3  ?MCHC 32.8 31.9 33.2  ?RDW 13.2 13.2 13.2  ?PLT 217 269 250  ? ?Thyroid No results for input(s): TSH, FREET4 in the last 168 hours.  ?BNP ?Recent Labs  ?Lab 02/15/22 ?1027  ?BNP 202.1*  ?  ?DDimer No results for input(s): DDIMER in the last 168 hours.  ? ?Radiology  ?  ?No results found. ? ?Cardiac Studies  ? ?Echo 1/20  EF 35-40% with mild RV dysfunction RVSP 40 mmHG ?- Echo (12/16/19): EF 40-45% Moderate RV dysfunction  ?- Echo 4/23 EF 35-40% Moderate RV dysfunction ?- cMRI 8/21 EF 45% + infiltrative process suggestive of possible amyloidosis ?- cMRI repeated 09/2021 EF 43% LGE concerning for sarcoid.  ?- PYP 4/22. Ratio 1.26 read as equivocal.  SPEP negative ? ?Patient Profile  ?   ?41 y.o. male with a history of combined  systolic/diastolic heart failure, He underwent atrial flutter ablation on 04/06/18. Subsequently had marked 1st AV block with progression to junctional rhythm. obesity, HTN, AFib, OSA, PVCs, CKD (IIIa), and gout ?  ?Admitted after syncope--> WCT and A/C HFrEF.  ?Recurrent VT and transferred to ICD > amio gtt ?4/10 transitioned to PO amio ? ?Assessment & Plan  ?  ?1.  Syncope with VT ?None on the last 24 hours ?QRS has been narrow chronically. ?He is described as permanent AF, would not need atrial lead.  ?Continue mexitil. ?Transitioned to PO amiodarone yesterday ?Team last week discussed no driving x 6 months.  ?Would plan on up-titrating BB post device given h/o bradycardia ? ?For ICD implant today ?Dr. Lovena Le has seen the patient, answered follow up questions, he remains agreeable ? ?  ?2. Combined Systolic/Diastolic Heart Failure ?cMRI 11/22 with LVEF 43% and possible sarcoid.  ?Chronic NYHA II-III symptoms ?  ?Has seen Dr. Broadus John for Genetics Counseling. Felt to have characteristics for a genetic CM but not to have pathogenic variant for HCM.  ?cMRI raised concern for TTR amyloid initially (negative by genetic testing), repeat more suggestive of sarcoid as above.  ?It is felt his mother may have LMNA cardiomyopathy, but testing has been non-diagnostic for both of them. ?Dr. Haroldine Laws discussed  DNA sent for full cardiomyopathy panel including LMNA.  If negative will need to consider empiric treatment of sarcoid.  ? ?Has had marked diuresis ?Labs stable ?C/w HF team ? ?3. Pulmonary HTN ?- RV strain on echo.  ?- RHC in 2/21 minimal PAH with low PAPi. ?- Continue CPAP ?  ?4. Morbid Obesity: ?Body mass index is 43.44 kg/m?Marland Kitchen  ?Continue weight loss efforts ?  ?5. PVCs ?Previous monitor only showed 6%, but felt to possibly be contributing to cardiomyopathy.  ?occ on tele ?  ?6. Longstanding persistent atrial  fib/Atrial flutter  ?S/p a flutter ablation 04/06/18 with Dr. Lovena Le. ?EKG 12/16/2019 showed ? Junctional rhythm at  72 bpm ?Subsequent EKGs all show AF with controlled rate.  ?On Eliquis chronically, on hold for procedures >> heparin gtt currently ?CHA2DS2VASC is at least 2. ? ? ?For questions or updates, please contact [place] ?Please consult [email or web address] for contact info under  ? ?Signed, ?Baldwin Jamaica, PA-C  ?02/22/2022, 7:54 AM   ? ?EP Attending ? ?Patient seen and examined. Agree with the findings as noted above. The patient is doing well this morning. VT has been quiet and he is off IV amiodarone. He has had a very large diuresis and his weight is down. I have reviewed the indications/risks/benefits/goals/expectations of ICD insertion and he wishes to proceed. ? ?Salome Spotted ? ?

## 2022-02-22 NOTE — Progress Notes (Signed)
Orthopedic Tech Progress Note ?Patient Details:  ?Bradley Powell ?1981/04/10 ?[id number] ? ?RN stated "patient has arm sling" ? ?Patient ID: Bradley Powell, male   DOB: 06-23-1981, 41 y.o.   MRN: [id number] ? ?Bradley Powell ?02/22/2022, 2:01 PM ? ?

## 2022-02-22 NOTE — Interval H&P Note (Signed)
History and Physical Interval Note: ? ?02/22/2022 ?12:00 PM ? ?Bradley Powell  has presented today for surgery, with the diagnosis of bradicardia.  The various methods of treatment have been discussed with the patient and family. After consideration of risks, benefits and other options for treatment, the patient has consented to  Procedure(s): ?ICD IMPLANT (N/A) as a surgical intervention.  The patient's history has been reviewed, patient examined, no change in status, stable for surgery.  I have reviewed the patient's chart and labs.  Questions were answered to the patient's satisfaction.   ? ? ?Bradley Powell ? ? ?

## 2022-02-22 NOTE — Progress Notes (Addendum)
? ? Advanced Heart Failure Rounding Note ? ?PCP-Cardiologist: None  ? ?Subjective:   ?Admitted after syncope--> WCT and A/C HFrEF.  ?4/6 had recurrent sustained VT w/ rates 200-210, spontaneous converted to NSR w/o intervention. Moved to ICU ?4/10 Switched to PO amio  ? ?Brisk diuresis again yesterday, 6.9L in UOP.  Wt down another 8 lb. Down 42 lb total.  ? ?SCr stable, 1.68>>1.67  ?K 4.4  ?Mg 2.0  ? ?BP stable. AFL rate controlled. No further VT.  ? ?Awaiting ICD today.  ? ?Feels well no dyspnea. Denies CP.  ? ? ?Objective:   ?Weight Range: ?116.4 kg ?Body mass index is 39 kg/m?.  ? ?Vital Signs:   ?Temp:  [97.6 ?F (36.4 ?C)-98.9 ?F (37.2 ?C)] 97.9 ?F (36.6 ?C) (04/11 0720) ?Pulse Rate:  [60-74] 63 (04/11 0700) ?Resp:  [13-31] 15 (04/11 0700) ?BP: (92-118)/(60-77) 104/68 (04/11 0700) ?SpO2:  [92 %-100 %] 96 % (04/11 0700) ?Weight:  [116.4 kg] 116.4 kg (04/11 0500) ?Last BM Date : 02/20/22 ? ?Weight change: ?Filed Weights  ? 02/20/22 0500 02/21/22 0500 02/22/22 0500  ?Weight: 123.5 kg 119.1 kg 116.4 kg  ? ? ?Intake/Output:  ? ?Intake/Output Summary (Last 24 hours) at 02/22/2022 0810 ?Last data filed at 02/22/2022 0720 ?Gross per 24 hour  ?Intake 1169.98 ml  ?Output 7550 ml  ?Net -6380.02 ml  ? ? ?Physical Exam  ?  ?General:  Well appearing. No respiratory difficulty ?HEENT: normal ?Neck: supple. JVD 9 cm. Carotids 2+ bilat; no bruits. No lymphadenopathy or thyromegaly appreciated. ?Cor: PMI nondisplaced. Irregularly irregular rhythm and rate. No rubs, gallops or murmurs. ?Lungs: decreased BS at the bases b/l  ?Abdomen: soft, nontender, nondistended. No hepatosplenomegaly. No bruits or masses. Good bowel sounds. ?Extremities: no cyanosis, clubbing, rash, 2+ b/l LE L>R ?Neuro: alert & oriented x 3, cranial nerves grossly intact. moves all 4 extremities w/o difficulty. Affect pleasant. ? ? ?Telemetry  ? ?AFL 70s, no further SVT Personally reviewed ? ?Labs  ?  ?CBC ?Recent Labs  ?  02/20/22 ?0246 02/22/22 ?0038   ?WBC 7.1 7.7  ?HGB 17.8* 17.1*  ?HCT 55.8* 51.5  ?MCV 89.6 88.2  ?PLT 269 250  ? ?Basic Metabolic Panel ?Recent Labs  ?  02/21/22 ?0117 02/22/22 ?0038  ?NA 136 134*  ?K 3.6 4.4  ?CL 97* 95*  ?CO2 26 26  ?GLUCOSE 64* 67*  ?BUN 19 24*  ?CREATININE 1.68* 1.67*  ?CALCIUM 9.5 9.4  ?MG 1.9 2.0  ? ?Liver Function Tests ?No results for input(s): AST, ALT, ALKPHOS, BILITOT, PROT, ALBUMIN in the last 72 hours. ? ?No results for input(s): LIPASE, AMYLASE in the last 72 hours. ?Cardiac Enzymes ?No results for input(s): CKTOTAL, CKMB, CKMBINDEX, TROPONINI in the last 72 hours. ? ?BNP: ?BNP (last 3 results) ?Recent Labs  ?  06/18/21 ?1218 08/11/21 ?1652 02/15/22 ?1027  ?BNP 315.5* 198.8* 202.1*  ? ? ?ProBNP (last 3 results) ?No results for input(s): PROBNP in the last 8760 hours. ? ? ?D-Dimer ?No results for input(s): DDIMER in the last 72 hours. ?Hemoglobin A1C ?No results for input(s): HGBA1C in the last 72 hours. ?Fasting Lipid Panel ?No results for input(s): CHOL, HDL, LDLCALC, TRIG, CHOLHDL, LDLDIRECT in the last 72 hours. ?Thyroid Function Tests ?No results for input(s): TSH, T4TOTAL, T3FREE, THYROIDAB in the last 72 hours. ? ?Invalid input(s): FREET3 ? ?Other results: ? ? ?Imaging  ? ? ?No results found. ? ? ?Medications:   ? ? ?Scheduled Medications: ? allopurinol  200 mg Oral Daily  ?  amiodarone  400 mg Oral BID  ? chlorhexidine  60 mL Topical Once  ? Chlorhexidine Gluconate Cloth  6 each Topical Daily  ? gentamicin irrigation  80 mg Irrigation On Call  ? mexiletine  150 mg Oral Q12H  ? pantoprazole  40 mg Oral Daily  ? potassium chloride  40 mEq Oral TID  ? sodium chloride flush  3 mL Intravenous Q12H  ? sodium chloride flush  3 mL Intravenous Q12H  ? spironolactone  25 mg Oral Daily  ? ? ?Infusions: ? sodium chloride    ? sodium chloride 50 mL/hr at 02/22/22 0720  ? sodium chloride    ?  ceFAZolin (ANCEF) IV    ? furosemide 62 mL/hr at 02/22/22 0700  ? ? ?PRN Medications: ?sodium chloride, acetaminophen, ondansetron  (ZOFRAN) IV, sodium chloride, sodium chloride flush, sodium chloride flush ? ? ? ?Patient Profile  ? ?Mr Brescia is a 41 year old with a history of combined systolic/diastolic heart failure, He underwent atrial flutter ablation on 04/06/18. Subsequently had marked 1st AV block with progression to junctional rhythm. obesity, HTN, A flutter, A flutter ablation, and gout.  ?  ?Admitted after syncope--> WCT and A/C HFrEF.  ?  ? ?Assessment/Plan  ? ?1. Syncope--> WCT on admit  ?-EP consulted for ICD.  ?-AA per EP--> Started on mexiletine 150 mg twice a day.   ?-No driving 6 months.  ?-Holding eliquis for now given need for ICD. Continue heparin drip.  ?-Plan ICD today  ?  ?2. A/C HFrEF ?- Echo 1/20  EF 35-40% with mild RV dysfunction RVSP 40 mmHG ?- Echo (12/16/19): EF 40-45% Moderate RV dysfunction  ?- Echo 4/23 EF 35-40% Moderate RV dysfunction ?- cMRI 8/21 EF 45% + infiltrative process suggestive of possible amyloidosis ?- cMRI repeated 09/2021 EF 43% LGE concerning for sarcoid.  ?- PYP 4/22. Ratio 1.26 read as equivocal.  SPEP negative ?- He was seen by Dr. Broadus John for Genetics Counseling. Felt to have characteristics for a genetic CM but not to have pathogenic variant for HCM.  ?- Case d/w Dr. Broadus John. DNA sent for full cardiomyopathy panel including LMNA.  If negative will need to consider empiric treatment of sarcoid.  ?- Diuresing well. Remains volume overloaded. Continue IV Lasix 120 mg tid ?- Continue Spiro 25 mg daily  ?- Continue unna boots.  ?- No bb with bradycardia.  ?- Renal function stable ?  ?3.Chronic AF/AFL ?- S/p a flutter ablation 04/06/18 with Dr. Lovena Le. ?- On eliquis prior to admit currently held. Continue heparin drip until all procedures completed.  ?  ?4. OSA ?- Continue CPAP ?  ?5. PVCs  ?- On mexilitene at home. Amio added here for VT ?- better suppressed  ?  ?6. Acute on CKD Stage IIIa ?- Creatinine 1.48 -> 1.64 -> 1.63->1.68->1.67 ?- monitor w/ diuresis  ? ?7. Hypomagnesemia/ Hypokalemia ?-  keep Mg > 2.0 and K > 4.0 ?- stable today  ?- supp K w/ diuresis  ?  ?8. Obesity ?- Body mass index is 39 kg/m?. ? ?Length of Stay: 7 ? ?Lyda Jester, PA-C  ?02/22/2022, 8:10 AM ? ?Advanced Heart Failure Team ?Pager [phone] (M-F; 7a - 5p)  ?Please contact [facility] for night-coverage after hours (5p -7a ) and weekends on [email or web address] ? ?Patient seen and examined with the above-signed Advanced Practice Provider and/or Housestaff. I personally reviewed laboratory data, imaging studies and relevant notes. I independently examined the patient and formulated the important aspects of the plan. I  have edited the note to reflect any of my changes or salient points. I have personally discussed the plan with the patient and/or family. ? ?Remains on IV lasix. Diuresing well. No SOB, orthopnea or PND. No recurrent VT ? ?General:  Well appearing. No resp difficulty ?HEENT: normal ?Neck: supple. JVP to jaw Carotids 2+ bilat; no bruits. No lymphadenopathy or thryomegaly appreciated. ?Cor: PMI nondisplaced. Irregular rate & rhythm. No rubs, gallops or murmurs. ?Lungs: clear ?Abdomen: obese soft, nontender, nondistended. No hepatosplenomegaly. No bruits or masses. Good bowel sounds. ?Extremities: no cyanosis, clubbing, rash, 2+ edema ?Neuro: alert & orientedx3, cranial nerves grossly intact. moves all 4 extremities w/o difficulty. Affect pleasant ? ?Weight down 42 pounds but still fluid overloaded. Scr stable. Will continue IV diuresis. Continue heparin. Plan for ICD today.  ? ?Glori Bickers, MD  ?9:11 AM ? ? ? ? ? ? ?

## 2022-02-22 NOTE — Progress Notes (Signed)
Orthopedic Tech Progress Note ?Patient Details:  ?AMED DATTA ?1980-12-01 ?[id number] ? ?Ortho Devices ?Type of Ortho Device: Unna boot ?Ortho Device/Splint Location: BLE ?Ortho Device/Splint Interventions: Ordered, Application, Adjustment ?  ?Post Interventions ?Patient Tolerated: Well ?Instructions Provided: Care of device ? ?[name] ?02/22/2022, 9:53 AM ? ?

## 2022-02-23 ENCOUNTER — Inpatient Hospital Stay (HOSPITAL_COMMUNITY): Payer: 59

## 2022-02-23 ENCOUNTER — Encounter (HOSPITAL_COMMUNITY): Payer: Self-pay | Admitting: Internal Medicine

## 2022-02-23 DIAGNOSIS — I5023 Acute on chronic systolic (congestive) heart failure: Secondary | ICD-10-CM | POA: Diagnosis not present

## 2022-02-23 LAB — BASIC METABOLIC PANEL
Anion gap: 10 (ref 5–15)
BUN: 21 mg/dL — ABNORMAL HIGH (ref 6–20)
CO2: 28 mmol/L (ref 22–32)
Calcium: 9.3 mg/dL (ref 8.9–10.3)
Chloride: 98 mmol/L (ref 98–111)
Creatinine, Ser: 1.59 mg/dL — ABNORMAL HIGH (ref 0.61–1.24)
GFR, Estimated: 57 mL/min — ABNORMAL LOW (ref >60)
Glucose, Bld: 87 mg/dL (ref 70–99)
Potassium: 4 mmol/L (ref 3.5–5.1)
Sodium: 136 mmol/L (ref 135–145)

## 2022-02-23 LAB — CBC
HCT: 52.4 % — ABNORMAL HIGH (ref 39.0–52.0)
Hemoglobin: 16.9 g/dL (ref 13.0–17.0)
MCH: 28.8 pg (ref 26.0–34.0)
MCHC: 32.3 g/dL (ref 30.0–36.0)
MCV: 89.3 fL (ref 80.0–100.0)
Platelets: 229 10*3/uL (ref 150–400)
RBC: 5.87 MIL/uL — ABNORMAL HIGH (ref 4.22–5.81)
RDW: 13.1 % (ref 11.5–15.5)
WBC: 6.8 10*3/uL (ref 4.0–10.5)
nRBC: 0 % (ref 0.0–0.2)

## 2022-02-23 LAB — MAGNESIUM: Magnesium: 1.8 mg/dL (ref 1.7–2.4)

## 2022-02-23 IMAGING — DX DG CHEST 1V PORT
1 series · 1 of 1 positions shown · non-contrast
Comparison: Portable chest [DATE] and earlier.

CLINICAL DATA: 40-year-old male with AICD placed yesterday.

EXAM:
PORTABLE CHEST 1 VIEW

[chest ap]
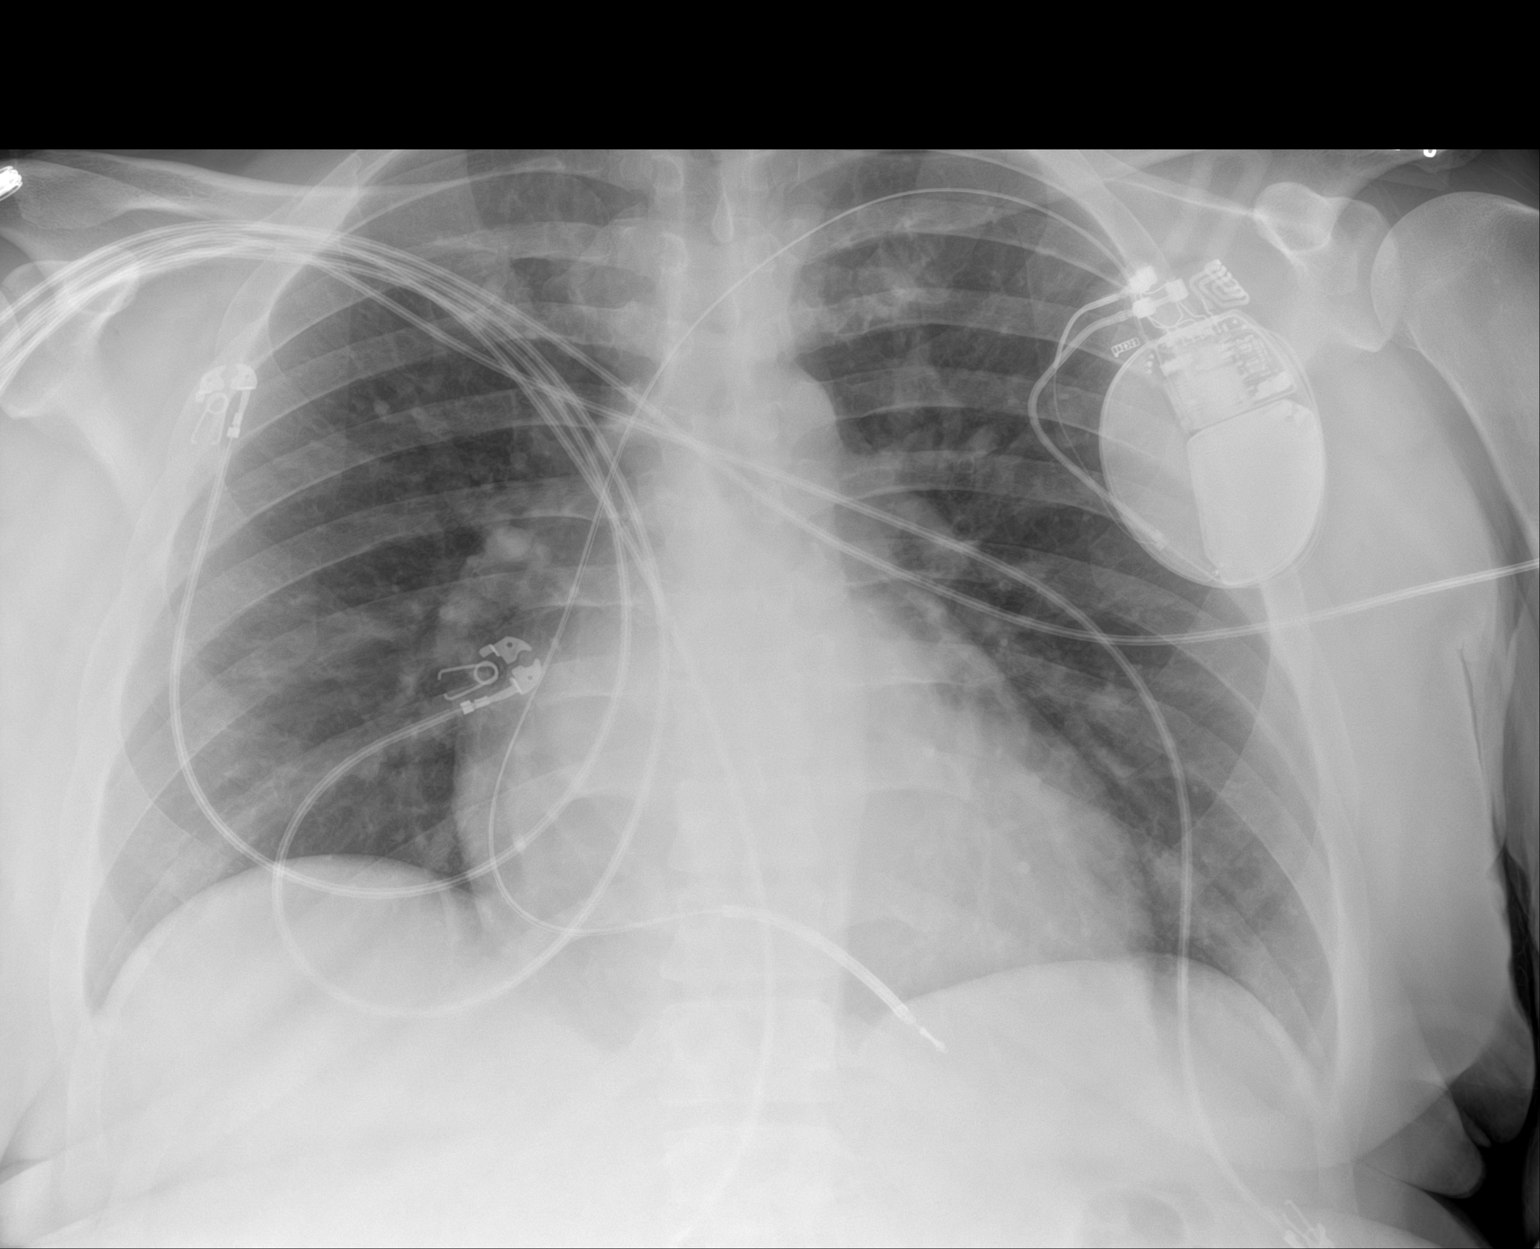

[1 of 1 positions shown; findings below may reference images not displayed]

FINDINGS: Portable AP semi upright view at [RK] hours. New left chest single
lead AICD. Lead courses to the RV apex region. No pneumothorax or
adverse features. Mild cardiomegaly. Allowing for portable technique
the lungs are clear. Visualized tracheal air column is within normal
limits. No osseous abnormality identified. Negative visible bowel
gas.
IMPRESSION: Left chest AICD with no adverse features.  Mild cardiomegaly.

## 2022-02-23 MED ORDER — MAGNESIUM SULFATE 2 GM/50ML IV SOLN
2.0000 g | Freq: Once | INTRAVENOUS | Status: AC
  Administered 2022-02-23: 2 g via INTRAVENOUS
  Filled 2022-02-23: qty 50

## 2022-02-23 NOTE — Progress Notes (Signed)
Pt states he can place CPAP on himself, will call if help is needed. ?

## 2022-02-23 NOTE — Progress Notes (Addendum)
? ? Advanced Heart Failure Rounding Note ? ?PCP-Cardiologist: None  ? ?Subjective:   ?Admitted after syncope--> WCT and A/C HFrEF.  ?4/6 had recurrent sustained VT w/ rates 200-210, spontaneous converted to NSR w/o intervention. Moved to ICU ?4/10 Switched to PO amio  ?04/12 ICD implant ? ? ?Diuresed well again yesterday. Weight down another 8 lb, total 50 lb since admit.  Scr 1.67>1.59. K 4.0, mag 1.8. ? ?No dyspnea or CP today. Mild tenderness at ICD site. Still feels like he has some swelling in abdomen and thighs. ? ? ? ?Objective:   ?Weight Range: ?112.8 kg ?Body mass index is 37.81 kg/m?.  ? ?Vital Signs:   ?Temp:  [97.4 ?F (36.3 ?C)-98.4 ?F (36.9 ?C)] 97.9 ?F (36.6 ?C) (04/12 0328) ?Pulse Rate:  [40-78] 68 (04/12 0800) ?Resp:  [12-36] 20 (04/12 0800) ?BP: (92-123)/(61-88) 105/64 (04/12 0600) ?SpO2:  [94 %-100 %] 97 % (04/12 0800) ?Weight:  [112.8 kg] 112.8 kg (04/12 0500) ?Last BM Date : 02/20/22 ? ?Weight change: ?Filed Weights  ? 02/21/22 0500 02/22/22 0500 02/23/22 0500  ?Weight: 119.1 kg 116.4 kg 112.8 kg  ? ? ?Intake/Output:  ? ?Intake/Output Summary (Last 24 hours) at 02/23/2022 0843 ?Last data filed at 02/23/2022 0700 ?Gross per 24 hour  ?Intake 888.24 ml  ?Output 4550 ml  ?Net -3661.76 ml  ? ? ?Physical Exam  ?  ?General: No distress, sitting up in chair. ?HEENT: normal ?Neck: supple. JVP 8-10 cm. Carotids 2+ bilat; no bruits.  ?Cor: PMI nondisplaced. Irregular rate & rhythm. No rubs, gallops or murmurs. ?Lungs: diminished in bases ?Abdomen: soft, nontender, + distended. No hepatosplenomegaly.  ?Extremities: no cyanosis, clubbing, rash, 1-2 + bl/ LE edema, + UNNA boots ?Neuro: alert & orientedx3, cranial nerves grossly intact. moves all 4 extremities w/o difficulty. Affect pleasant ? ? ? ?Telemetry  ? ?AFL 60s-70s (personally reviewed) ? ?Labs  ?  ?CBC ?Recent Labs  ?  02/22/22 ?0038 02/23/22 ?0117  ?WBC 7.7 6.8  ?HGB 17.1* 16.9  ?HCT 51.5 52.4*  ?MCV 88.2 89.3  ?PLT 250 229  ? ?Basic Metabolic  Panel ?Recent Labs  ?  02/22/22 ?0038 02/23/22 ?0117  ?NA 134* 136  ?K 4.4 4.0  ?CL 95* 98  ?CO2 26 28  ?GLUCOSE 67* 87  ?BUN 24* 21*  ?CREATININE 1.67* 1.59*  ?CALCIUM 9.4 9.3  ?MG 2.0 1.8  ? ?Liver Function Tests ?No results for input(s): AST, ALT, ALKPHOS, BILITOT, PROT, ALBUMIN in the last 72 hours. ? ?No results for input(s): LIPASE, AMYLASE in the last 72 hours. ?Cardiac Enzymes ?No results for input(s): CKTOTAL, CKMB, CKMBINDEX, TROPONINI in the last 72 hours. ? ?BNP: ?BNP (last 3 results) ?Recent Labs  ?  06/18/21 ?1218 08/11/21 ?1652 02/15/22 ?1027  ?BNP 315.5* 198.8* 202.1*  ? ? ?ProBNP (last 3 results) ?No results for input(s): PROBNP in the last 8760 hours. ? ? ?D-Dimer ?No results for input(s): DDIMER in the last 72 hours. ?Hemoglobin A1C ?No results for input(s): HGBA1C in the last 72 hours. ?Fasting Lipid Panel ?No results for input(s): CHOL, HDL, LDLCALC, TRIG, CHOLHDL, LDLDIRECT in the last 72 hours. ?Thyroid Function Tests ?No results for input(s): TSH, T4TOTAL, T3FREE, THYROIDAB in the last 72 hours. ? ?Invalid input(s): FREET3 ? ?Other results: ? ? ?Imaging  ? ? ?EP PPM/ICD IMPLANT ? ?Result Date: 02/22/2022 ?CONCLUSIONS:  1. Non-schemic cardiomyopathy with chronic New [place] Heart Association class III heart failure and VT with syncope.  2. Successful ICD implantation.  3. No early apparent complications.  ICD Criteria Current LVEF:30%. Within 12 months prior to implant: Yes Heart failure history: Yes, Class III Cardiomyopathy history: Yes, Non-Ischemic Cardiomyopathy. Atrial Fibrillation/Atrial Flutter: Yes, Long standing (> 1 Year). Ventricular tachycardia history: Yes, Hemodynamic instability present. VT Type: Sustained Ventricular Tachycardia - Monomorphic. Cardiac arrest history: No. History of syndromes with risk of sudden death: No. Previous ICD: No. Current ICD indication: Secondary PPM indication: No.  Class I or II Bradycardia indication present: No Beta Blocker therapy for 3 or more  months: Yes, prescribed. Ace Inhibitor/ARB therapy for 3 or more months: Yes, prescribed. Cristopher Peru, MD 4:09 PM 02/22/2022  ? ?DG CHEST PORT 1 VIEW ? ?Result Date: 02/23/2022 ?CLINICAL DATA:  41 year old male with AICD placed yesterday. EXAM: PORTABLE CHEST 1 VIEW COMPARISON:  Portable chest 02/15/2022 and earlier. FINDINGS: Portable AP semi upright view at 0702 hours. New left chest single lead AICD. Lead courses to the RV apex region. No pneumothorax or adverse features. Mild cardiomegaly. Allowing for portable technique the lungs are clear. Visualized tracheal air column is within normal limits. No osseous abnormality identified. Negative visible bowel gas. IMPRESSION: Left chest AICD with no adverse features.  Mild cardiomegaly. Electronically Signed   By: Genevie Ann M.D.   On: 02/23/2022 07:12   ? ? ?Medications:   ? ? ?Scheduled Medications: ? allopurinol  200 mg Oral Daily  ? amiodarone  400 mg Oral BID  ? Chlorhexidine Gluconate Cloth  6 each Topical Daily  ? mexiletine  150 mg Oral Q12H  ? pantoprazole  40 mg Oral Daily  ? potassium chloride  40 mEq Oral TID  ? sodium chloride flush  3 mL Intravenous Q12H  ? sodium chloride flush  3 mL Intravenous Q12H  ? spironolactone  25 mg Oral Daily  ? ? ?Infusions: ? sodium chloride    ? furosemide 62 mL/hr at 02/23/22 0700  ? ? ?PRN Medications: ?sodium chloride, acetaminophen, acetaminophen, ondansetron (ZOFRAN) IV, ondansetron (ZOFRAN) IV, sodium chloride, sodium chloride flush ? ? ? ?Patient Profile  ? ?Mr Mu is a 41 year old with a history of combined systolic/diastolic heart failure, He underwent atrial flutter ablation on 04/06/18. Subsequently had marked 1st AV block with progression to junctional rhythm. obesity, HTN, A flutter, A flutter ablation, and gout.  ?  ?Admitted after syncope--> WCT and A/C HFrEF.  ?  ? ?Assessment/Plan  ? ?1. Syncope--> WCT on admit  ?-EP consulted for ICD.  ?-AA per EP--> Started on mexiletine 150 mg twice a day.   ?-Amiodarone  taper per EP, 400 mg BID X 1 week, 200 mg BID X 2 weeks, then 200 mg daily ?-No driving 6 months.  ?-S/p ICD 04/11. No PTX on CXR. ?  ?2. A/C HFrEF ?- Echo 1/20  EF 35-40% with mild RV dysfunction RVSP 40 mmHG ?- Echo (12/16/19): EF 40-45% Moderate RV dysfunction  ?- Echo 4/23 EF 35-40% Moderate RV dysfunction ?- cMRI 8/21 EF 45% + infiltrative process suggestive of possible amyloidosis ?- cMRI repeated 09/2021 EF 43% LGE concerning for sarcoid.  ?- PYP 4/22. Ratio 1.26 read as equivocal.  SPEP negative ?- He was seen by Dr. Broadus John for Genetics Counseling. Felt to have characteristics for a genetic CM but not to have pathogenic variant for HCM.  ?- Case d/w Dr. Broadus John. DNA sent for full cardiomyopathy panel including LMNA.  If negative will need to consider empiric treatment of sarcoid.  ?- Diuresing well. Still with volume overload on exam. Continue high dose IV lasix TID again  today.  ?- Continue Spiro 25 mg daily  ?- ? Addition of low-dose beta blocker now that has ICD ?- Did not tolerate ARNi and ARB in the past d/t hypotension, N, V ?- Consider SGLT2i prior to discharge. Previously on Jardiance. ?- Hypotension with Bidil in the past ?- Continue unna boots.  ?- Renal function stable ?  ?3.Chronic AF/AFL ?- S/p a flutter ablation 04/06/18 with Dr. Lovena Le. ?- On eliquis prior to admit currently held. Hold until 04/17 per EP d/t ICD implant. ?- Rate controlled. ?  ?4. OSA ?- Continue CPAP ?  ?5. PVCs  ?- On mexilitene at home. Amio added here for VT ?- Better suppressed  ?  ?6. Acute on CKD Stage IIIa ?- Creatinine 1.48 -> 1.64 -> 1.63->1.68->1.67->1.59 ?- monitor w/ diuresis  ? ?7. Hypomagnesemia/ Hypokalemia ?- keep Mg > 2.0 and K > 4.0 ?- K 4.0 and Mag 1.8, supp today ?  ?8. Obesity ?- Body mass index is 37.81 kg/m?. ? ? ?Can likely transfer out of ICU to stepdown unit soon. ? ? ?Length of Stay: 8 ? ?FINCH, LINDSAY N, PA-C  ?02/23/2022, 8:43 AM ? ?Advanced Heart Failure Team ?Pager [phone] (M-F; 7a - 5p)  ?Please  contact [facility] for night-coverage after hours (5p -7a ) and weekends on [email or web address] ? ? ?Patient seen and examined with the above-signed Advanced Practice Provider and/or Housestaff. I personally reviewed

## 2022-02-23 NOTE — Progress Notes (Addendum)
? ?Progress Note ? ?Patient Name: Bradley Powell ?Date of Encounter: 02/23/2022 ? ?[facility] Cardiologist: Dr. [name] ? ?Subjective  ? ?No new complaints, no rest SOB, no CP, no changes, mild post op discomfort ? ?Inpatient Medications  ?  ?Scheduled Meds: ? allopurinol  200 mg Oral Daily  ? amiodarone  400 mg Oral BID  ? Chlorhexidine Gluconate Cloth  6 each Topical Daily  ? mexiletine  150 mg Oral Q12H  ? pantoprazole  40 mg Oral Daily  ? potassium chloride  40 mEq Oral TID  ? sodium chloride flush  3 mL Intravenous Q12H  ? sodium chloride flush  3 mL Intravenous Q12H  ? spironolactone  25 mg Oral Daily  ? ?Continuous Infusions: ? sodium chloride    ? furosemide 62 mL/hr at 02/23/22 0700  ? ?PRN Meds: ?sodium chloride, acetaminophen, acetaminophen, ondansetron (ZOFRAN) IV, ondansetron (ZOFRAN) IV, sodium chloride, sodium chloride flush  ? ?Vital Signs  ?  ?Vitals:  ? 02/23/22 0630 02/23/22 0700 02/23/22 0730 02/23/22 0800  ?BP:      ?Pulse: 70 78 74 68  ?Resp: 13 15 15 20   ?Temp:      ?TempSrc:      ?SpO2: 98% 99% 99% 97%  ?Weight:      ?Height:      ? ? ?Intake/Output Summary (Last 24 hours) at 02/23/2022 0826 ?Last data filed at 02/23/2022 0700 ?Gross per 24 hour  ?Intake 888.24 ml  ?Output 4550 ml  ?Net -3661.76 ml  ? ? ?  02/23/2022  ?  5:00 AM 02/22/2022  ?  5:00 AM 02/21/2022  ?  5:00 AM  ?Last 3 Weights  ?Weight (lbs) 248 lb 10.9 oz 256 lb 9.9 oz 262 lb 9.1 oz  ?Weight (kg) 112.8 kg 116.4 kg 119.1 kg  ?   ? ?Telemetry  ?  ?AFib, 60's, occ PVCs - Personally Reviewed ? ?ECG  ?  ?Atypical AFlutter/coarse AFib 65bpm, QRS 73ms - Personally Reviewed ? ?Physical Exam  ? ?GEN: No acute distress.   ?Neck: No JVD ?Cardiac: irreg-irreg, no murmurs, rubs, or gallops.  ?Respiratory: CTA b/l. ?GI: Soft, nontender, non-distended  ?MS: unna boots, + edema; No deformity. ?Neuro:  Nonfocal  ?Psych: Normal affect  ? ?ICD site: dressing is stable, no hematoma ? ?Labs  ?  ?High Sensitivity Troponin:   ?Recent Labs  ?Lab  02/15/22 ?1027 02/15/22 ?1517  ?TROPONINIHS 24* 225*  ?   ?Chemistry ?Recent Labs  ?Lab 02/21/22 ?0117 02/22/22 ?0038 02/23/22 ?0117  ?NA 136 134* 136  ?K 3.6 4.4 4.0  ?CL 97* 95* 98  ?CO2 26 26 28   ?GLUCOSE 64* 67* 87  ?BUN 19 24* 21*  ?CREATININE 1.68* 1.67* 1.59*  ?CALCIUM 9.5 9.4 9.3  ?MG 1.9 2.0 1.8  ?GFRNONAA 53* 53* 57*  ?ANIONGAP 13 13 10   ?  ?Lipids No results for input(s): CHOL, TRIG, HDL, LABVLDL, LDLCALC, CHOLHDL in the last 168 hours.  ?Hematology ?Recent Labs  ?Lab 02/20/22 ?0246 02/22/22 ?0038 02/23/22 ?0117  ?WBC 7.1 7.7 6.8  ?RBC 6.23* 5.84* 5.87*  ?HGB 17.8* 17.1* 16.9  ?HCT 55.8* 51.5 52.4*  ?MCV 89.6 88.2 89.3  ?MCH 28.6 29.3 28.8  ?MCHC 31.9 33.2 32.3  ?RDW 13.2 13.2 13.1  ?PLT 269 250 229  ? ?Thyroid No results for input(s): TSH, FREET4 in the last 168 hours.  ?BNP ?No results for input(s): BNP, PROBNP in the last 168 hours. ?  ?DDimer No results for input(s): DDIMER in the last 168  hours.  ? ?Radiology  ?  ?EP PPM/ICD IMPLANT ? ?Result Date: 02/22/2022 ?CONCLUSIONS:  1. Non-schemic cardiomyopathy with chronic New York Heart Association class III heart failure and VT with syncope.  2. Successful ICD implantation.  3. No early apparent complications. ICD Criteria Current LVEF:30%. Within 12 months prior to implant: Yes Heart failure history: Yes, Class III Cardiomyopathy history: Yes, Non-Ischemic Cardiomyopathy. Atrial Fibrillation/Atrial Flutter: Yes, Long standing (> 1 Year). Ventricular tachycardia history: Yes, Hemodynamic instability present. VT Type: Sustained Ventricular Tachycardia - Monomorphic. Cardiac arrest history: No. History of syndromes with risk of sudden death: No. Previous ICD: No. Current ICD indication: Secondary PPM indication: No.  Class I or II Bradycardia indication present: No Beta Blocker therapy for 3 or more months: Yes, prescribed. Ace Inhibitor/ARB therapy for 3 or more months: Yes, prescribed. Lewayne Bunting, MD 4:09 PM 02/22/2022  ? ?DG CHEST PORT 1 VIEW ? ?Result  Date: 02/23/2022 ?CLINICAL DATA:  41 year old male with AICD placed yesterday. EXAM: PORTABLE CHEST 1 VIEW COMPARISON:  Portable chest 02/15/2022 and earlier. FINDINGS: Portable AP semi upright view at 0702 hours. New left chest single lead AICD. Lead courses to the RV apex region. No pneumothorax or adverse features. Mild cardiomegaly. Allowing for portable technique the lungs are clear. Visualized tracheal air column is within normal limits. No osseous abnormality identified. Negative visible bowel gas. IMPRESSION: Left chest AICD with no adverse features.  Mild cardiomegaly. Electronically Signed   By: Odessa Fleming M.D.   On: 02/23/2022 07:12   ? ?Cardiac Studies  ? ?Echo 1/20  EF 35-40% with mild RV dysfunction RVSP 40 mmHG ?- Echo (12/16/19): EF 40-45% Moderate RV dysfunction  ?- Echo 4/23 EF 35-40% Moderate RV dysfunction ?- cMRI 8/21 EF 45% + infiltrative process suggestive of possible amyloidosis ?- cMRI repeated 09/2021 EF 43% LGE concerning for sarcoid.  ?- PYP 4/22. Ratio 1.26 read as equivocal.  SPEP negative ? ?Patient Profile  ?   ?41 y.o. male with a history of combined systolic/diastolic heart failure, He underwent atrial flutter ablation on 04/06/18. Subsequently had marked 1st AV block with progression to junctional rhythm. obesity, HTN, AFib, OSA, PVCs, CKD (IIIa), and gout ?  ?Admitted after syncope--> WCT and A/C HFrEF.  ?Recurrent VT and transferred to ICD > amio gtt ?4/10 transitioned to PO amio ? ?Assessment & Plan  ?  ?1.  Syncope with VT ?No VT, less PVCs ?QRS has been narrow chronically. ?He is described as permanent AF, would not need atrial lead.  ?Continue mexitil. ?Transitioned to PO amiodarone 02/21/22 ?Team last week discussed no driving x 6 months.  ? ? ?Now s/p ICD implant yetsredar ?Site is stable ?CXR this AM without ptx ?Activity restrictions d/w with patient ?Follow up is in place ?Dr. Elberta Fortis has seen the patient this AM ?OK to discharge from EP perspective when ready  otherwise ? ?Amiodarone taper 400mg  BID x1 week > 200mg  BID x2 weeks > 200mg  daily ?OK for betablocker ?Base rate is currently at 40bpm programmed, patient was aware of RV pacing with testing today ? ?  ?2. Combined Systolic/Diastolic Heart Failure ?cMRI 11/22 with LVEF 43% and possible sarcoid.  ?Chronic NYHA II-III symptoms ?  ?Has seen Dr. Jomarie Longs for Genetics Counseling. Felt to have characteristics for a genetic CM but not to have pathogenic variant for HCM.  ?cMRI raised concern for TTR amyloid initially (negative by genetic testing), repeat more suggestive of sarcoid as above.  ?It is felt his mother may have LMNA cardiomyopathy, but  testing has been non-diagnostic for both of them. ?Dr. [name] discussed  DNA sent for full cardiomyopathy panel including LMNA.  If negative Niles Ess need to consider empiric treatment of sarcoid.  ? ?Has had marked diuresis ?Labs stable ?C/w HF team ? ?3. Pulmonary HTN ?- RV strain on echo.  ?- RHC in 2/21 minimal PAH with low PAPi. ?- Continue CPAP ?  ?4. Morbid Obesity: ?Body mass index is 43.44 kg/m?Marland Kitchen  ?Continue weight loss efforts ?  ?5. PVCs ?Previous monitor only showed 6%, but felt to possibly be contributing to cardiomyopathy.  ?occ on tele ?  ?6. Longstanding persistent atrial fib/Atrial flutter  ?S/p a flutter ablation 04/06/18 with Dr. Ladona Ridgel. ?EKG 12/16/2019 showed ? Junctional rhythm at 72 bpm ?Subsequent EKGs all show AF with controlled rate.  ?On Eliquis chronically, on hold for procedures  ?CHA2DS2VASC is at least 2. ?No Eliquis until 02/27/22 please post implant ? ? ? ? ?For questions or updates, please contact [facility] ?Please consult [email or web address] for contact info under  ? ?Signed, ?Sheilah Pigeon, PA-C  ?02/23/2022, 8:26 AM   ? ?I have seen and examined this patient with Francis Dowse.  Agree with above, note added to reflect my findings.  Mild incisional pain but otherwise feeling well. ? ?GEN: Well nourished, well developed, in no acute distress  ?HEENT:  normal  ?Neck: no JVD, carotid bruits, or masses ?Cardiac: RRR; no murmurs, rubs, or gallops,no edema  ?Respiratory:  clear to auscultation bilaterally, normal work of breathing ?GI: soft, nontender, nondistended, + BS ?MS

## 2022-02-24 LAB — BASIC METABOLIC PANEL
Anion gap: 7 (ref 5–15)
BUN: 22 mg/dL — ABNORMAL HIGH (ref 6–20)
CO2: 26 mmol/L (ref 22–32)
Calcium: 9 mg/dL (ref 8.9–10.3)
Chloride: 99 mmol/L (ref 98–111)
Creatinine, Ser: 1.52 mg/dL — ABNORMAL HIGH (ref 0.61–1.24)
GFR, Estimated: 60 mL/min — ABNORMAL LOW (ref >60)
Glucose, Bld: 120 mg/dL — ABNORMAL HIGH (ref 70–99)
Potassium: 4.1 mmol/L (ref 3.5–5.1)
Sodium: 132 mmol/L — ABNORMAL LOW (ref 135–145)

## 2022-02-24 LAB — CBC
HCT: 50 % (ref 39.0–52.0)
Hemoglobin: 16.7 g/dL (ref 13.0–17.0)
MCH: 29.2 pg (ref 26.0–34.0)
MCHC: 33.4 g/dL (ref 30.0–36.0)
MCV: 87.6 fL (ref 80.0–100.0)
Platelets: 218 10*3/uL (ref 150–400)
RBC: 5.71 MIL/uL (ref 4.22–5.81)
RDW: 12.9 % (ref 11.5–15.5)
WBC: 8.5 10*3/uL (ref 4.0–10.5)
nRBC: 0 % (ref 0.0–0.2)

## 2022-02-24 LAB — MAGNESIUM: Magnesium: 2.2 mg/dL (ref 1.7–2.4)

## 2022-02-24 MED ORDER — FUROSEMIDE 10 MG/ML IJ SOLN
120.0000 mg | Freq: Three times a day (TID) | INTRAVENOUS | Status: DC
  Administered 2022-02-24 – 2022-02-25 (×3): 120 mg via INTRAVENOUS
  Filled 2022-02-24: qty 10
  Filled 2022-02-24: qty 12
  Filled 2022-02-24 (×2): qty 10
  Filled 2022-02-24: qty 12

## 2022-02-24 MED ORDER — ENOXAPARIN SODIUM 30 MG/0.3ML IJ SOSY
30.0000 mg | PREFILLED_SYRINGE | INTRAMUSCULAR | Status: DC
  Administered 2022-02-24: 30 mg via SUBCUTANEOUS
  Filled 2022-02-24: qty 0.3

## 2022-02-24 NOTE — Progress Notes (Signed)
?   02/23/22 2354  ?Assess: MEWS Score  ?Level of Consciousness Alert  ?Assess: MEWS Score  ?MEWS Temp 0  ?MEWS Systolic 0  ?MEWS Pulse 0  ?MEWS RR 0  ?MEWS LOC 0  ?MEWS Score 0  ?MEWS Score Color Green  ?Treat  ?Pain Scale 0-10  ?Pain Score 6  ?Pain Type Acute pain  ?Pain Location Chest  ?Pain Orientation Left  ?Pain Descriptors / Indicators Sharp;Spasm ?("feels like when you get shocked by static electricity")  ?Pain Frequency Occasional  ?Pain Onset Sudden ?(suddenly resolved)  ?Pain Intervention(s) MD notified (Comment)  ?Notify: Charge Nurse/RN  ?Name of Charge Nurse/RN Notified Rondall Allegra, RN  ?Date Charge Nurse/RN Notified 02/24/22  ?Time Charge Nurse/RN Notified 0021  ?Notify: Provider  ?Provider Name/Title Dr. Rudi Rummage ?(Cardiology [facility])  ?Date Provider Notified 02/24/22  ?Time Provider Notified [phone]  ?Notification Type Page  ?Notification Reason Requested by patient/family  ? ?Pt stated he had multiple chest pains since ICD placement. No response from Dr. Rudi Rummage at this time. RN will continue to monitor for abnormalities on cardiac monitor and for chest pains. ?

## 2022-02-24 NOTE — TOC Initial Note (Signed)
Transition of Care (TOC) - Initial/Assessment Note  ? ? ?Patient Details  ?Name: Bradley Powell ?MRN: [id number] ?Date of Birth: 12/15/1980 ? ?Transition of Care [facility]) CM/SW Contact:    ?Mariea Stable Davene Costain, RN ?Phone Number: [phone] ?02/24/2022, 4:40 PM ? ?Clinical Narrative:                 ? ?HF TOC CM spoke to pt at bedside. States he will need note for work. States he is independent at home. Family will provide transportation. He weighs daily and will try to adhere to a heart healthy/low sodium diet. Will continue to follow for dc needs.  ? ? ? ? ?Expected Discharge Plan: Home/Self Care ?Barriers to Discharge: Continued Medical Work up ? ? ?Patient Goals and CMS Choice ?Patient states their goals for this hospitalization and ongoing recovery are:: wants to get back to his normal life ?CMS Medicare.gov Compare Post Acute Care list provided to:: Patient ?  ? ?Expected Discharge Plan and Services ?Expected Discharge Plan: Home/Self Care ?  ?Discharge Planning Services: CM Consult ?  ?Living arrangements for the past 2 months: Single Family Home ?                ?  ?  ?  ?  ?  ?  ?  ?  ?  ?  ? ?Prior Living Arrangements/Services ?Living arrangements for the past 2 months: Single Family Home ?Lives with:: Self ?Patient language and need for interpreter reviewed:: Yes ?Do you feel safe going back to the place where you live?: Yes      ?Need for Family Participation in Patient Care: No (Comment) ?Care giver support system in place?: No (comment) ?  ?Criminal Activity/Legal Involvement Pertinent to Current Situation/Hospitalization: No - Comment as needed ? ?Activities of Daily Living ?Home Assistive Devices/Equipment: CPAP ?ADL Screening (condition at time of admission) ?Patient's cognitive ability adequate to safely complete daily activities?: Yes ?Is the patient deaf or have difficulty hearing?: No ?Does the patient have difficulty seeing, even when wearing glasses/contacts?: No ?Does the patient have  difficulty concentrating, remembering, or making decisions?: No ?Patient able to express need for assistance with ADLs?: Yes ?Does the patient have difficulty dressing or bathing?: No ?Independently performs ADLs?: Yes (appropriate for developmental age) ?Does the patient have difficulty walking or climbing stairs?: Yes ?Weakness of Legs: Both ?Weakness of Arms/Hands: None ? ?Permission Sought/Granted ?Permission sought to share information with : Case Manager, Family Supports, PCP ?Permission granted to share information with : Yes, Verbal Permission Granted ? Share Information with NAME: Bradley Powell ?   ? Permission granted to share info w Relationship: mother ? Permission granted to share info w Contact Information: [phone] ? ?Emotional Assessment ?Appearance:: Appears stated age ?Attitude/Demeanor/Rapport: Engaged ?Affect (typically observed): Accepting ?Orientation: : Oriented to Self, Oriented to Place, Oriented to  Time, Oriented to Situation ?  ?Psych Involvement: No (comment) ? ?Admission diagnosis:  Syncope and collapse [R55] ?Ventricular tachycardia (HCC) [I47.20] ?Syncope, unspecified syncope type [R55] ?Patient Active Problem List  ? Diagnosis Date Noted  ? Syncope and collapse 02/15/2022  ? Acute on chronic systolic (congestive) heart failure (HCC) 02/15/2022  ? Hepatic cirrhosis (HCC) 06/16/2020  ? Dehydration with hyponatremia 06/16/2020  ? Hypotension due to hypovolemia 06/16/2020  ? Intractable nausea and vomiting 06/16/2020  ? Acute kidney injury (HCC) 06/15/2020  ? Paroxysmal atrial fibrillation (HCC) 04/29/2020  ? Chronic combined systolic and diastolic congestive heart failure (HCC) 04/07/2018  ? NICM (nonischemic cardiomyopathy) (HCC) 04/07/2018  ?  Hyperglycemia 04/07/2018  ? Typical atrial flutter (HCC) 04/06/2018  ? Paroxysmal atrial flutter (HCC) 04/06/2018  ? Gout 03/30/2017  ? Suspected sleep apnea 03/01/2017  ? Edema 02/24/2017  ? Testicular swelling 02/24/2017  ? CHF  (congestive heart failure) (HCC) 02/24/2017  ? Overweight 02/24/2017  ? Costochondritis 02/24/2017  ? Sinus tachycardia 02/24/2017  ? First degree AV block 02/24/2017  ? Scrotal edema 02/24/2017  ? Morbid obesity (HCC) 02/24/2017  ? ?PCP:  [name], MD ?Pharmacy:   ?[facility] - [place], [place] - [address], [address] ?[address] ?[place] [address] ?Phone: [phone] Fax: [phone] ? ?[id number] - [place], [place] - [address] ?[address] ?[place] [address] ?Phone: [phone] Fax: [phone] ? ?[id number] - [place] - [phone] ?[address] ?[place] [address] ?Phone: [phone] Fax: [phone] ? ? ? ? ?Social Determinants of Health (SDOH) Interventions ?  ? ?Readmission Risk Interventions ?   ? View : No data to display.  ?  ?  ?  ? ? ? ?

## 2022-02-24 NOTE — Progress Notes (Signed)
Pt states he will self administer CPAP when he is ready for bed. 

## 2022-02-24 NOTE — Progress Notes (Signed)
Orthopedic Tech Progress Note ?Patient Details:  ?Bradley Powell ?06/05/1981 ?[id number] ? ? ?Ortho Devices ?Type of Ortho Device: Unna boot ?Ortho Device/Splint Location: BLE ?Ortho Device/Splint Interventions: Ordered, Application, Adjustment ?  ?Post Interventions ?Patient Tolerated: Well ?Instructions Provided: Care of device ? ?Bradley Powell ?02/24/2022, 6:12 PM ? ?

## 2022-02-24 NOTE — Plan of Care (Signed)
?  Problem: Education: ?Goal: Ability to demonstrate management of disease process will improve ?Outcome: Progressing ?Goal: Ability to verbalize understanding of medication therapies will improve ?Outcome: Progressing ?Goal: Individualized Educational Video(s) ?Outcome: Progressing ?  ?Problem: Activity: ?Goal: Capacity to carry out activities will improve ?Outcome: Progressing ?  ?Problem: Education: ?Goal: Knowledge of disease or condition will improve ?Outcome: Progressing ?Goal: Understanding of medication regimen will improve ?Outcome: Progressing ?Goal: Individualized Educational Video(s) ?Outcome: Progressing ?  ?Problem: Activity: ?Goal: Ability to tolerate increased activity will improve ?Outcome: Progressing ?  ?Problem: Health Behavior/Discharge Planning: ?Goal: Ability to safely manage health-related needs after discharge will improve ?Outcome: Progressing ?  ?Problem: Education: ?Goal: Knowledge of General Education information will improve ?Description: Including pain rating scale, medication(s)/side effects and non-pharmacologic comfort measures ?Outcome: Progressing ?  ?Problem: Health Behavior/Discharge Planning: ?Goal: Ability to manage health-related needs will improve ?Outcome: Progressing ?  ?Problem: Clinical Measurements: ?Goal: Ability to maintain clinical measurements within normal limits will improve ?Outcome: Progressing ?Goal: Will remain free from infection ?Outcome: Progressing ?Goal: Diagnostic test results will improve ?Outcome: Progressing ?Goal: Respiratory complications will improve ?Outcome: Progressing ?Goal: Cardiovascular complication will be avoided ?Outcome: Progressing ?  ?Problem: Activity: ?Goal: Risk for activity intolerance will decrease ?Outcome: Progressing ?  ?Problem: Nutrition: ?Goal: Adequate nutrition will be maintained ?Outcome: Progressing ?  ?Problem: Coping: ?Goal: Level of anxiety will decrease ?Outcome: Progressing ?  ?Problem: Elimination: ?Goal: Will not  experience complications related to bowel motility ?Outcome: Progressing ?  ?Problem: Pain Managment: ?Goal: General experience of comfort will improve ?Outcome: Progressing ?  ?Problem: Safety: ?Goal: Ability to remain free from injury will improve ?Outcome: Progressing ?  ?Problem: Skin Integrity: ?Goal: Risk for impaired skin integrity will decrease ?Outcome: Progressing ?  ?Problem: Cardiac: ?Goal: Ability to achieve and maintain adequate cardiopulmonary perfusion will improve ?Outcome: Not Progressing ?  ?Problem: Cardiac: ?Goal: Ability to achieve and maintain adequate cardiopulmonary perfusion will improve ?Outcome: Not Progressing ?  ?Problem: Elimination: ?Goal: Will not experience complications related to urinary retention ?Outcome: Not Progressing ?  ?

## 2022-02-24 NOTE — Progress Notes (Addendum)
? ? Advanced Heart Failure Rounding Note ? ?PCP-Cardiologist: None  ? ?Subjective:   ?Admitted after syncope--> WCT and A/C HFrEF.  ?4/6 had recurrent sustained VT w/ rates 200-210, spontaneous converted to NSR w/o intervention. Moved to ICU ?4/10 Switched to PO amio  ?04/12 ICD implant ? 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 ?Diuresed with IV lasix. Negative 3 liters. Weight another 2 pounds.  Weight down 50 pounds.  ? ?Complaining of chest soreness. Denies SOB.  ? ?Objective:   ?Weight Range: ?111.5 kg ?Body mass index is 37.38 kg/m?.  ? ?Vital Signs:   ?Temp:  [97.8 ?F (36.6 ?C)-98.5 ?F (36.9 ?C)] 97.9 ?F (36.6 ?C) (04/13 [id number]) ?Pulse Rate:  [65-77] 66 (04/13 0352) ?Resp:  [14-25] 17 (04/13 0728) ?BP: (93-126)/(63-88) 113/76 (04/13 [id number]) ?SpO2:  [94 %-98 %] 97 % (04/13 0728) ?Weight:  [111.5 kg] 111.5 kg (04/13 0600) ?Last BM Date : 02/23/22 ? ?Weight change: ?Filed Weights  ? 02/22/22 0500 02/23/22 0500 02/24/22 0600  ?Weight: 116.4 kg 112.8 kg 111.5 kg  ? ? ?Intake/Output:  ? ?Intake/Output Summary (Last 24 hours) at 02/24/2022 0855 ?Last data filed at 02/24/2022 0736 ?Gross per 24 hour  ?Intake 914.31 ml  ?Output 3760 ml  ?Net -2845.69 ml  ? ? ?Physical Exam  ?  ?General:  Sitting on the side of the bed. No resp difficulty ?HEENT: normal ?Neck: supple. JVP 9-10 . Carotids 2+ bilat; no bruits. No lymphadenopathy or thryomegaly appreciated. ?Cor: PMI nondisplaced. Regular rate & rhythm. No rubs, gallops or murmurs. ?Lungs: clear ?Abdomen: soft, nontender, nondistended. No hepatosplenomegaly. No bruits or masses. Good bowel sounds. ?Extremities: no cyanosis, clubbing, rash,  R and LLE unna boots. R and LLE 1+ edema ?Neuro: alert & orientedx3, cranial nerves grossly intact. moves all 4 extremities w/o difficulty. Affect pleasant ? ?Telemetry  ? ?AFL 60-70s with occasional PVCs.  ? ?Labs  ?  ?CBC ?Recent Labs  ?  02/23/22 ?0117 02/24/22 ?0310  ?WBC 6.8 8.5  ?HGB 16.9 16.7  ?HCT 52.4* 50.0  ?MCV 89.3 87.6  ?PLT 229 218  ? ?Basic  Metabolic Panel ?Recent Labs  ?  02/23/22 ?0117 02/24/22 ?0310  ?NA 136 132*  ?K 4.0 4.1  ?CL 98 99  ?CO2 28 26  ?GLUCOSE 87 120*  ?BUN 21* 22*  ?CREATININE 1.59* 1.52*  ?CALCIUM 9.3 9.0  ?MG 1.8 2.2  ? ?Liver Function Tests ?No results for input(s): AST, ALT, ALKPHOS, BILITOT, PROT, ALBUMIN in the last 72 hours. ? ?No results for input(s): LIPASE, AMYLASE in the last 72 hours. ?Cardiac Enzymes ?No results for input(s): CKTOTAL, CKMB, CKMBINDEX, TROPONINI in the last 72 hours. ? ?BNP: ?BNP (last 3 results) ?Recent Labs  ?  06/18/21 ?1218 08/11/21 ?1652 02/15/22 ?1027  ?BNP 315.5* 198.8* 202.1*  ? ? ?ProBNP (last 3 results) ?No results for input(s): PROBNP in the last 8760 hours. ? ? ?D-Dimer ?No results for input(s): DDIMER in the last 72 hours. ?Hemoglobin A1C ?No results for input(s): HGBA1C in the last 72 hours. ?Fasting Lipid Panel ?No results for input(s): CHOL, HDL, LDLCALC, TRIG, CHOLHDL, LDLDIRECT in the last 72 hours. ?Thyroid Function Tests ?No results for input(s): TSH, T4TOTAL, T3FREE, THYROIDAB in the last 72 hours. ? ?Invalid input(s): FREET3 ? ?Other results: ? ? ?Imaging  ? ? ?No results found. ? ? ?Medications:   ? ? ?Scheduled Medications: ? allopurinol  200 mg Oral Daily  ? amiodarone  400 mg Oral BID  ? Chlorhexidine Gluconate Cloth  6 each Topical Daily  ?  mexiletine  150 mg Oral Q12H  ? pantoprazole  40 mg Oral Daily  ? potassium chloride  40 mEq Oral TID  ? sodium chloride flush  3 mL Intravenous Q12H  ? sodium chloride flush  3 mL Intravenous Q12H  ? spironolactone  25 mg Oral Daily  ? ? ?Infusions: ? sodium chloride    ? furosemide 120 mg (02/24/22 0711)  ? ? ?PRN Medications: ?sodium chloride, acetaminophen, ondansetron (ZOFRAN) IV, sodium chloride, sodium chloride flush ? ? ? ?Patient Profile  ? ?Bradley Powell is a 41 year old with a history of combined systolic/diastolic heart failure, He underwent atrial flutter ablation on 04/06/18. Subsequently had marked 1st AV block with progression to  junctional rhythm. obesity, HTN, A flutter, A flutter ablation, and gout.  ?  ?Admitted after syncope--> WCT and A/C HFrEF.  ?  ? ?Assessment/Plan  ? ?1. Syncope--> WCT on admit  ?-EP consulted for ICD.  ?-AA per EP--> Started on mexiletine 150 mg twice a day.   ?-Amiodarone taper per EP, 400 mg BID X 1 week, 200 mg BID X 2 weeks, then 200 mg daily ?-No driving 6 months.  ?- No eliquis until 02/27/22 per EP  ?-S/p ICD 04/11. No PTX on CXR. ? ?  ?2. A/C HFrEF ?- Echo 1/20  EF 35-40% with mild RV dysfunction RVSP 40 mmHG ?- Echo (12/16/19): EF 40-45% Moderate RV dysfunction  ?- Echo 4/23 EF 35-40% Moderate RV dysfunction ?- cMRI 8/21 EF 45% + infiltrative process suggestive of possible amyloidosis ?- cMRI repeated 09/2021 EF 43% LGE concerning for sarcoid.  ?- PYP 4/22. Ratio 1.26 read as equivocal.  SPEP negative ?- He was seen by Dr. Broadus John for Genetics Counseling. Felt to have characteristics for a genetic CM but not to have pathogenic variant for HCM.  ?- Case d/w Dr. Broadus John. DNA sent for full cardiomyopathy panel including LMNA.  Work c/w LMNA.  ?- Volume status improving. Still more to go. Continue high dose IV lasix.  ?- Continue Spiro 25 mg daily  ?- ? Addition of low-dose beta blocker now that has ICD ?- Did not tolerate ARNi and ARB in the past d/t hypotension, N, V ?- Consider SGLT2i prior to discharge. Previously on Jardiance. ?- Hypotension with Bidil in the past ?- Continue unna boots.  ?- Renal function stable ?  ?3.Chronic AF/AFL ?- S/p a flutter ablation 04/06/18 with Dr. Lovena Le. ?- On eliquis prior to admit currently held. Hold until 04/17 per EP d/t ICD implant. ?- Rate controlled. Continue amiodarone.  ?  ?4. OSA ?- Continue CPAP ?  ?5. PVCs  ?- On mexilitene at home. Amio added here for VT ?- Better suppressed  ?  ?6. Acute on CKD Stage IIIa ?- Creatinine 1.5 today. Stable.   ?- monitor w/ diuresis  ? ?7. Hypomagnesemia/ Hypokalemia ?- keep Mg > 2.0 and K > 4.0 ?- K 4.1 and Mag 2.2 , supp today ?  ?8.  Obesity ?- Body mass index is 37.38 kg/m?. ? ? ?Per EP no eliquis until 4/16 ? ?Ambulate today.  ? ?Length of Stay: 9 ? ?Darrick Grinder, NP  ?02/24/2022, 8:55 AM ? ?Advanced Heart Failure Team ?Pager [phone] (M-F; 7a - 5p)  ?Please contact [facility] for night-coverage after hours (5p -7a ) and weekends on [email or web address] ? ?Patient seen and examined with the above-signed Advanced Practice Provider and/or Housestaff. I personally reviewed laboratory data, imaging studies and relevant notes. I independently examined the patient and formulated the important aspects  of the plan. I have edited the note to reflect any of my changes or salient points. I have personally discussed the plan with the patient and/or family. ? ?Continues to diurese with IV lasix. Weight now down 50 pounds. No further VT. ICD site ok. No bleeding. Renal function stable.  ? ?General:  Sitting in chair No resp difficulty ?HEENT: normal ?Neck: supple. JVP to jaw. Carotids 2+ bilat; no bruits. No lymphadenopathy or thryomegaly appreciated. ?Cor: PMI nondisplaced. Irregular rate & rhythm. No rubs, gallops or murmurs. ICD ok ?Lungs: clear ?Abdomen: obese soft, nontender, nondistended. No hepatosplenomegaly. No bruits or masses. Good bowel sounds. ?Extremities: no cyanosis, clubbing, rash, 2+ edema ?Neuro: alert & orientedx3, cranial nerves grossly intact. moves all 4 extremities w/o difficulty. Affect pleasant ? ?Continues to diurese well but still overloaded. Renal function stable.Will continue IV diuresis.  ? ?Genetic w/u c/w LMNA cardiomyopathy.  ? ?I d/w EP ok to start Eliquis tomorrow night. Continue low dose lovenox for DVT prophylaxis.  ? ?Glori Bickers, MD  ?9:45 AM ? ? ?

## 2022-02-24 NOTE — Plan of Care (Signed)
  Problem: Education: Goal: Ability to demonstrate management of disease process will improve Outcome: Progressing Goal: Ability to verbalize understanding of medication therapies will improve Outcome: Progressing Goal: Individualized Educational Video(s) Outcome: Progressing   Problem: Activity: Goal: Capacity to carry out activities will improve Outcome: Progressing   Problem: Cardiac: Goal: Ability to achieve and maintain adequate cardiopulmonary perfusion will improve Outcome: Progressing   Problem: Education: Goal: Knowledge of disease or condition will improve Outcome: Progressing Goal: Understanding of medication regimen will improve Outcome: Progressing Goal: Individualized Educational Video(s) Outcome: Progressing   Problem: Activity: Goal: Ability to tolerate increased activity will improve Outcome: Progressing   Problem: Cardiac: Goal: Ability to achieve and maintain adequate cardiopulmonary perfusion will improve Outcome: Progressing   Problem: Health Behavior/Discharge Planning: Goal: Ability to safely manage health-related needs after discharge will improve Outcome: Progressing   Problem: Education: Goal: Knowledge of General Education information will improve Description: Including pain rating scale, medication(s)/side effects and non-pharmacologic comfort measures Outcome: Progressing   Problem: Health Behavior/Discharge Planning: Goal: Ability to manage health-related needs will improve Outcome: Progressing   Problem: Clinical Measurements: Goal: Ability to maintain clinical measurements within normal limits will improve Outcome: Progressing Goal: Will remain free from infection Outcome: Progressing Goal: Diagnostic test results will improve Outcome: Progressing Goal: Respiratory complications will improve Outcome: Progressing Goal: Cardiovascular complication will be avoided Outcome: Progressing   Problem: Activity: Goal: Risk for activity  intolerance will decrease Outcome: Progressing   Problem: Nutrition: Goal: Adequate nutrition will be maintained Outcome: Progressing   Problem: Coping: Goal: Level of anxiety will decrease Outcome: Progressing   Problem: Elimination: Goal: Will not experience complications related to bowel motility Outcome: Progressing Goal: Will not experience complications related to urinary retention Outcome: Progressing   Problem: Pain Managment: Goal: General experience of comfort will improve Outcome: Progressing   Problem: Safety: Goal: Ability to remain free from injury will improve Outcome: Progressing   Problem: Skin Integrity: Goal: Risk for impaired skin integrity will decrease Outcome: Progressing   

## 2022-02-24 NOTE — Progress Notes (Signed)
Mobility Specialist Progress Note  ? ? 02/24/22 1712  ?Mobility  ?Activity Ambulated independently in hallway  ?Level of Assistance Standby assist, set-up cues, supervision of patient - no hands on  ?Assistive Device None  ?Distance Ambulated (ft) 800 ft  ?Activity Response Tolerated fair  ?$Mobility charge 1 Mobility  ? ?Post-Mobility: 74 HR, 127/85 BP ? ?Pt received in chair and agreeable. C/o feeling a little disoriented during but he states it was not enough to make him fall. Returned to sitting EOB with call bell in reach.  ? ?[place] ?Mobility Specialist  ?  ?

## 2022-02-25 ENCOUNTER — Other Ambulatory Visit (HOSPITAL_COMMUNITY): Payer: Self-pay

## 2022-02-25 LAB — CBC
HCT: 49.7 % (ref 39.0–52.0)
Hemoglobin: 16.2 g/dL (ref 13.0–17.0)
MCH: 29.3 pg (ref 26.0–34.0)
MCHC: 32.6 g/dL (ref 30.0–36.0)
MCV: 89.9 fL (ref 80.0–100.0)
Platelets: 207 10*3/uL (ref 150–400)
RBC: 5.53 MIL/uL (ref 4.22–5.81)
RDW: 12.8 % (ref 11.5–15.5)
WBC: 8.4 10*3/uL (ref 4.0–10.5)
nRBC: 0 % (ref 0.0–0.2)

## 2022-02-25 LAB — BASIC METABOLIC PANEL
Anion gap: 10 (ref 5–15)
BUN: 26 mg/dL — ABNORMAL HIGH (ref 6–20)
CO2: 24 mmol/L (ref 22–32)
Calcium: 8.8 mg/dL — ABNORMAL LOW (ref 8.9–10.3)
Chloride: 99 mmol/L (ref 98–111)
Creatinine, Ser: 1.51 mg/dL — ABNORMAL HIGH (ref 0.61–1.24)
GFR, Estimated: >60 mL/min (ref >60)
Glucose, Bld: 137 mg/dL — ABNORMAL HIGH (ref 70–99)
Potassium: 4.2 mmol/L (ref 3.5–5.1)
Sodium: 133 mmol/L — ABNORMAL LOW (ref 135–145)

## 2022-02-25 LAB — MAGNESIUM: Magnesium: 1.9 mg/dL (ref 1.7–2.4)

## 2022-02-25 MED ORDER — METOLAZONE 2.5 MG PO TABS
5.0000 mg | ORAL_TABLET | Freq: Once | ORAL | Status: AC
  Administered 2022-02-25: 5 mg via ORAL
  Filled 2022-02-25: qty 2

## 2022-02-25 MED ORDER — MEXILETINE HCL 150 MG PO CAPS
150.0000 mg | ORAL_CAPSULE | Freq: Two times a day (BID) | ORAL | 5 refills | Status: DC
  Filled 2022-02-25: qty 60, 30d supply, fill #0

## 2022-02-25 MED ORDER — EMPAGLIFLOZIN 10 MG PO TABS
10.0000 mg | ORAL_TABLET | Freq: Every day | ORAL | Status: DC
  Administered 2022-02-25 – 2022-02-27 (×3): 10 mg via ORAL
  Filled 2022-02-25 (×3): qty 1

## 2022-02-25 MED ORDER — MAGNESIUM SULFATE 2 GM/50ML IV SOLN
2.0000 g | Freq: Once | INTRAVENOUS | Status: AC
  Administered 2022-02-25: 2 g via INTRAVENOUS
  Filled 2022-02-25: qty 50

## 2022-02-25 MED ORDER — APIXABAN 5 MG PO TABS
5.0000 mg | ORAL_TABLET | Freq: Two times a day (BID) | ORAL | Status: DC
  Administered 2022-02-25 – 2022-02-27 (×4): 5 mg via ORAL
  Filled 2022-02-25 (×4): qty 1

## 2022-02-25 MED ORDER — TORSEMIDE 20 MG PO TABS
40.0000 mg | ORAL_TABLET | Freq: Two times a day (BID) | ORAL | Status: DC
  Administered 2022-02-25 – 2022-02-26 (×2): 40 mg via ORAL
  Filled 2022-02-25 (×2): qty 2

## 2022-02-25 NOTE — Progress Notes (Addendum)
? ? Advanced Heart Failure Rounding Note ? ?PCP-Cardiologist: None  ? ?Subjective:   ?Admitted after syncope--> WCT and A/C HFrEF.  ?4/6 had recurrent sustained VT w/ rates 200-210, spontaneous converted to NSR w/o intervention. Moved to ICU ?4/10 Switched to PO amio  ?04/12 ICD implant ? 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 ?Diuresed with IV lasix. Negative 2.4 liters. Weight aUp 1 pound?   Weight down 50 pounds.  ? ? ?Feels better. Still sore left upper chest from ICD. Walked x2 ? ?Objective:   ?Weight Range: ?111.9 kg ?Body mass index is 37.51 kg/m?.  ? ?Vital Signs:   ?Temp:  [97.8 ?F (36.6 ?C)-98 ?F (36.7 ?C)] 97.9 ?F (36.6 ?C) (04/14 0557) ?Pulse Rate:  [62-66] 62 (04/14 0557) ?Resp:  [16-20] 20 (04/14 0557) ?BP: (102-122)/(69-80) 113/72 (04/14 0557) ?SpO2:  [92 %-97 %] 92 % (04/14 0557) ?Weight:  [111.9 kg] 111.9 kg (04/14 0557) ?Last BM Date : 02/23/22 ? ?Weight change: ?Filed Weights  ? 02/23/22 0500 02/24/22 0600 02/25/22 0557  ?Weight: 112.8 kg 111.5 kg 111.9 kg  ? ? ?Intake/Output:  ? ?Intake/Output Summary (Last 24 hours) at 02/25/2022 [id number] ?Last data filed at 02/25/2022 0602 ?Gross per 24 hour  ?Intake 1322 ml  ?Output 3800 ml  ?Net -2478 ml  ? ? ?Physical Exam  ?  ?General:  Sitting in the chair. No resp difficulty ?HEENT: normal ?Neck: supple. JVP 6-7 . Carotids 2+ bilat; no bruits. No lymphadenopathy or thryomegaly appreciated. ?Cor: PMI nondisplaced. Regular rate & rhythm. No rubs, gallops or murmurs. L upper chest dressing.  ?Lungs: clear ?Abdomen: soft, nontender, nondistended. No hepatosplenomegaly. No bruits or masses. Good bowel sounds. ?Extremities: no cyanosis, clubbing, rash, R and LLE unna boots. edema ?Neuro: alert & orientedx3, cranial nerves grossly intact. moves all 4 extremities w/o difficulty. Affect pleasant ? ?Telemetry  ? ?ALF 60-70s with occasional PVCs.   ? ?Labs  ?  ?CBC ?Recent Labs  ?  02/24/22 ?0310 02/25/22 ?[id number]  ?WBC 8.5 8.4  ?HGB 16.7 16.2  ?HCT 50.0 49.7  ?MCV 87.6 89.9  ?PLT 218  207  ? ?Basic Metabolic Panel ?Recent Labs  ?  02/24/22 ?0310 02/25/22 ?[id number]  ?NA 132* 133*  ?K 4.1 4.2  ?CL 99 99  ?CO2 26 24  ?GLUCOSE 120* 137*  ?BUN 22* 26*  ?CREATININE 1.52* 1.51*  ?CALCIUM 9.0 8.8*  ?MG 2.2 1.9  ? ?Liver Function Tests ?No results for input(s): AST, ALT, ALKPHOS, BILITOT, PROT, ALBUMIN in the last 72 hours. ? ?No results for input(s): LIPASE, AMYLASE in the last 72 hours. ?Cardiac Enzymes ?No results for input(s): CKTOTAL, CKMB, CKMBINDEX, TROPONINI in the last 72 hours. ? ?BNP: ?BNP (last 3 results) ?Recent Labs  ?  06/18/21 ?1218 08/11/21 ?1652 02/15/22 ?1027  ?BNP 315.5* 198.8* 202.1*  ? ? ?ProBNP (last 3 results) ?No results for input(s): PROBNP in the last 8760 hours. ? ? ?D-Dimer ?No results for input(s): DDIMER in the last 72 hours. ?Hemoglobin A1C ?No results for input(s): HGBA1C in the last 72 hours. ?Fasting Lipid Panel ?No results for input(s): CHOL, HDL, LDLCALC, TRIG, CHOLHDL, LDLDIRECT in the last 72 hours. ?Thyroid Function Tests ?No results for input(s): TSH, T4TOTAL, T3FREE, THYROIDAB in the last 72 hours. ? ?Invalid input(s): FREET3 ? ?Other results: ? ? ?Imaging  ? ? ?No results found. ? ? ?Medications:   ? ? ?Scheduled Medications: ? allopurinol  200 mg Oral Daily  ? amiodarone  400 mg Oral BID  ? Chlorhexidine Gluconate Cloth  6  each Topical Daily  ? enoxaparin (LOVENOX) injection  30 mg Subcutaneous Q24H  ? mexiletine  150 mg Oral Q12H  ? pantoprazole  40 mg Oral Daily  ? potassium chloride  40 mEq Oral TID  ? sodium chloride flush  3 mL Intravenous Q12H  ? sodium chloride flush  3 mL Intravenous Q12H  ? spironolactone  25 mg Oral Daily  ? ? ?Infusions: ? sodium chloride    ? furosemide 120 mg (02/24/22 2021)  ? ? ?PRN Medications: ?sodium chloride, acetaminophen, ondansetron (ZOFRAN) IV, sodium chloride, sodium chloride flush ? ? ? ?Patient Profile  ? ?Mr Evanoff is a 41 year old with a history of combined systolic/diastolic heart failure, He underwent atrial flutter  ablation on 04/06/18. Subsequently had marked 1st AV block with progression to junctional rhythm. obesity, HTN, A flutter, A flutter ablation, and gout.  ?  ?Admitted after syncope--> WCT and A/C HFrEF.  ?  ? ?Assessment/Plan  ? ?1. Syncope--> WCT on admit  ?-EP consulted for ICD.  ?-AA per EP--> Started on mexiletine 150 mg twice a day.   ?-Amiodarone taper per EP, 400 mg BID X 1 week, 200 mg BID X 2 weeks, then 200 mg daily ?-No driving 6 months.  ?- No eliquis until 02/27/22 per EP  ?-S/p ICD 04/11. No PTX on CXR. ? ?  ?2. A/C HFrEF ?- Echo 1/20  EF 35-40% with mild RV dysfunction RVSP 40 mmHG ?- Echo (12/16/19): EF 40-45% Moderate RV dysfunction  ?- Echo 4/23 EF 35-40% Moderate RV dysfunction ?- cMRI 8/21 EF 45% + infiltrative process suggestive of possible amyloidosis ?- cMRI repeated 09/2021 EF 43% LGE concerning for sarcoid.  ?- PYP 4/22. Ratio 1.26 read as equivocal.  SPEP negative ?- He was seen by Dr. Broadus John for Genetics Counseling. Felt to have characteristics for a genetic CM but not to have pathogenic variant for HCM.  ?- Case d/w Dr. Broadus John. DNA sent for full cardiomyopathy panel including LMNA.  Work up c/w LMNA.  ?- Volume status much better. Getting last dose of IV lasix this morning. Stop.  ?This afternoon start torsemide 40 mg twice a day.   ?- Continue Spiro 25 mg daily  ?- ? Addition of low-dose beta blocker now that has ICD ?- Did not tolerate ARNi and ARB in the past d/t hypotension, N, V ?- Restart jardiance 10 mg daily.  ?- Hypotension with Bidil in the past ?- Continue unna boots.  ?- Renal function stable ?  ?3.Chronic AF/AFL ?- S/p a flutter ablation 04/06/18 with Dr. Lovena Le. ?- On eliquis prior to admit currently held.  ?Ok to start eliquis tonight.  ?- Rate controlled.  Continue amiodarone.  ?  ?4. OSA ?- Continue CPAP ?  ?5. PVCs  ?- On mexilitene at home. Amio added here for VT ?- Better suppressed  ?  ?6. Acute on CKD Stage IIIa ?- Creatinine 1.5 today. Stable.   ?- monitor w/ diuresis   ? ?7. Hypomagnesemia/ Hypokalemia ?- keep Mg > 2.0 and K > 4.0 ?- K 4.2 and Mag 1.9 ?- Supp Mag ?  ?8. Obesity ?- Body mass index is 37.51 kg/m?. ? ? ?Ambulate.  ? ?Length of Stay: 10 ? ?Darrick Grinder, NP  ?02/25/2022, 7:22 AM ? ?Advanced Heart Failure Team ?Pager [phone] (M-F; 7a - 5p)  ?Please contact [facility] for night-coverage after hours (5p -7a ) and weekends on [email or web address] ? ?Patient seen and examined with the above-signed Advanced Practice Provider and/or Housestaff. I personally  reviewed laboratory data, imaging studies and relevant notes. I independently examined the patient and formulated the important aspects of the plan. I have edited the note to reflect any of my changes or salient points. I have personally discussed the plan with the patient and/or family. ? ?Continues on IV lasix. Diuresed well but weight unchanged. Denies CP or SOB. Rhythm stable. ICD site looks ok ? ?General:  Sitting in chair. No resp difficulty ?HEENT: normal ?Neck: supple.JVP up. Carotids 2+ bilat; no bruits. No lymphadenopathy or thryomegaly appreciated. ?Cor: PMI nondisplaced. Irregular rate & rhythm. No rubs, gallops or murmurs. ?Lungs: clear ?Abdomen: soft, nontender, nondistended. No hepatosplenomegaly. No bruits or masses. Good bowel sounds. ?Extremities: no cyanosis, clubbing, rash, 2+ edema + TED ?Neuro: alert & orientedx3, cranial nerves grossly intact. moves all 4 extremities w/o difficulty. Affect pleasant ? ?Remains on IV lasix. Still volume overload. Continue IV diuresis one more day. Add metolazone. Rhythm stable on po amio. ICD site ok. Restart Eliquis today. Supp electrolytes. ? ?Glori Bickers, MD  ?3:22 PM ? ? ? ?

## 2022-02-25 NOTE — Progress Notes (Signed)
Mobility Specialist Progress Note ? 02/25/22 1608  ?Mobility  ?Activity Ambulated independently in hallway  ?Level of Assistance Independent  ?Assistive Device None  ?Distance Ambulated (ft) 800 ft  ?Activity Response Tolerated well  ?$Mobility charge 1 Mobility  ? ?Post Mobility: 64 HR, 110/84 BP, 98% SpO2 ? ?Received in bed having no complaints and agreeable. Pt requiring no physical assist or breaks during ambulation. Pt did present tachy for majority of the time but claimed to feel fine throughout. Returned back to EOB and placed call bell in reach. RN notified about rhythm. ? ?Bradley Powell ?Mobility Specialist ?Phone Number [phone] ? ?

## 2022-02-26 LAB — CBC
HCT: 50.3 % (ref 39.0–52.0)
Hemoglobin: 16.9 g/dL (ref 13.0–17.0)
MCH: 29.6 pg (ref 26.0–34.0)
MCHC: 33.6 g/dL (ref 30.0–36.0)
MCV: 88.2 fL (ref 80.0–100.0)
Platelets: 215 10*3/uL (ref 150–400)
RBC: 5.7 MIL/uL (ref 4.22–5.81)
RDW: 12.8 % (ref 11.5–15.5)
WBC: 7.2 10*3/uL (ref 4.0–10.5)
nRBC: 0 % (ref 0.0–0.2)

## 2022-02-26 LAB — BASIC METABOLIC PANEL
Anion gap: 12 (ref 5–15)
BUN: 27 mg/dL — ABNORMAL HIGH (ref 6–20)
CO2: 26 mmol/L (ref 22–32)
Calcium: 9.3 mg/dL (ref 8.9–10.3)
Chloride: 97 mmol/L — ABNORMAL LOW (ref 98–111)
Creatinine, Ser: 1.77 mg/dL — ABNORMAL HIGH (ref 0.61–1.24)
GFR, Estimated: 50 mL/min — ABNORMAL LOW (ref >60)
Glucose, Bld: 171 mg/dL — ABNORMAL HIGH (ref 70–99)
Potassium: 3.5 mmol/L (ref 3.5–5.1)
Sodium: 135 mmol/L (ref 135–145)

## 2022-02-26 LAB — MAGNESIUM: Magnesium: 1.9 mg/dL (ref 1.7–2.4)

## 2022-02-26 MED ORDER — MAGNESIUM SULFATE IN D5W 1-5 GM/100ML-% IV SOLN
1.0000 g | Freq: Once | INTRAVENOUS | Status: DC
  Filled 2022-02-26: qty 100

## 2022-02-26 MED ORDER — POTASSIUM CHLORIDE CRYS ER 20 MEQ PO TBCR
40.0000 meq | EXTENDED_RELEASE_TABLET | Freq: Once | ORAL | Status: AC
  Administered 2022-02-26: 40 meq via ORAL
  Filled 2022-02-26: qty 2

## 2022-02-26 NOTE — Progress Notes (Signed)
Pt states he will place himself on CPAP when ready.  °

## 2022-02-26 NOTE — Progress Notes (Signed)
Pt placed self on CPAP

## 2022-02-26 NOTE — Progress Notes (Signed)
? ? Advanced Heart Failure Rounding Note ? ?PCP-Cardiologist: None  ? ?Subjective:   ?Admitted after syncope--> WCT and A/C HFrEF.  ?4/6 had recurrent sustained VT w/ rates 200-210, spontaneous converted to NSR w/o intervention. Moved to ICU ?4/10 Switched to PO amio  ?04/12 ICD implant ? 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 ?Remains on IV lasix. Out 7L overnight. Weight down 8 more pounds. (Total 57 pounds) ? ?Denies CP, SOB, orthopnea or PND. No VT ? ?Scr 1.5 -> 1.7 ? ? ?Objective:   ?Weight Range: ?108 kg ?Body mass index is 36.2 kg/m?.  ? ?Vital Signs:   ?Temp:  [97.6 ?F (36.4 ?C)-98.2 ?F (36.8 ?C)] 98.2 ?F (36.8 ?C) (04/15 0751) ?Pulse Rate:  [60-68] 68 (04/15 0751) ?Resp:  [14-24] 14 (04/15 0751) ?BP: (99-110)/(63-84) 105/74 (04/15 0751) ?SpO2:  [95 %-98 %] 95 % (04/15 0751) ?Weight:  [id number] kg] 108 kg (04/15 0600) ?Last BM Date : 02/24/22 ? ?Weight change: ?Filed Weights  ? 02/24/22 0600 02/25/22 0557 02/26/22 0600  ?Weight: 111.5 kg 111.9 kg 108 kg  ? ? ?Intake/Output:  ? ?Intake/Output Summary (Last 24 hours) at 02/26/2022 1124 ?Last data filed at 02/26/2022 1000 ?Gross per 24 hour  ?Intake 718 ml  ?Output 6605 ml  ?Net -5887 ml  ? ? ? ?Physical Exam  ?  ?General:  Well appearing. No resp difficulty ?HEENT: normal ?Neck: supple. no JVD. Carotids 2+ bilat; no bruits. No lymphadenopathy or thryomegaly appreciated. ?Cor: PMI nondisplaced. Irregular rate & rhythm. No rubs, gallops or murmurs. ICD site ok  ?Lungs: clear ?Abdomen: soft, nontender, nondistended. No hepatosplenomegaly. No bruits or masses. Good bowel sounds. ?Extremities: no cyanosis, clubbing, rash, edema ?Neuro: alert & orientedx3, cranial nerves grossly intact. moves all 4 extremities w/o difficulty. Affect pleasant ? ?Telemetry  ? ?AFL 60-70s occasional PVCs. Personally reviewed ? ?Labs  ?  ?CBC ?Recent Labs  ?  02/25/22 ?[id number] 02/26/22 ?[id number]  ?WBC 8.4 7.2  ?HGB 16.2 16.9  ?HCT 49.7 50.3  ?MCV 89.9 88.2  ?PLT 207 215  ? ? ?Basic Metabolic Panel ?Recent Labs  ?   02/25/22 ?[id number] 02/26/22 ?[id number]  ?NA 133* 135  ?K 4.2 3.5  ?CL 99 97*  ?CO2 24 26  ?GLUCOSE 137* 171*  ?BUN 26* 27*  ?CREATININE 1.51* 1.77*  ?CALCIUM 8.8* 9.3  ?MG 1.9 1.9  ? ? ?Liver Function Tests ?No results for input(s): AST, ALT, ALKPHOS, BILITOT, PROT, ALBUMIN in the last 72 hours. ? ?No results for input(s): LIPASE, AMYLASE in the last 72 hours. ?Cardiac Enzymes ?No results for input(s): CKTOTAL, CKMB, CKMBINDEX, TROPONINI in the last 72 hours. ? ?BNP: ?BNP (last 3 results) ?Recent Labs  ?  06/18/21 ?1218 08/11/21 ?1652 02/15/22 ?1027  ?BNP 315.5* 198.8* 202.1*  ? ? ? ?ProBNP (last 3 results) ?No results for input(s): PROBNP in the last 8760 hours. ? ? ?D-Dimer ?No results for input(s): DDIMER in the last 72 hours. ?Hemoglobin A1C ?No results for input(s): HGBA1C in the last 72 hours. ?Fasting Lipid Panel ?No results for input(s): CHOL, HDL, LDLCALC, TRIG, CHOLHDL, LDLDIRECT in the last 72 hours. ?Thyroid Function Tests ?No results for input(s): TSH, T4TOTAL, T3FREE, THYROIDAB in the last 72 hours. ? ?Invalid input(s): FREET3 ? ?Other results: ? ? ?Imaging  ? ? ?No results found. ? ? ?Medications:   ? ? ?Scheduled Medications: ? allopurinol  200 mg Oral Daily  ? amiodarone  400 mg Oral BID  ? apixaban  5 mg Oral BID  ? empagliflozin  10 mg  Oral Daily  ? mexiletine  150 mg Oral Q12H  ? pantoprazole  40 mg Oral Daily  ? potassium chloride  40 mEq Oral TID  ? sodium chloride flush  3 mL Intravenous Q12H  ? sodium chloride flush  3 mL Intravenous Q12H  ? spironolactone  25 mg Oral Daily  ? torsemide  40 mg Oral BID  ? ? ?Infusions: ? sodium chloride    ? ? ?PRN Medications: ?sodium chloride, acetaminophen, ondansetron (ZOFRAN) IV, sodium chloride, sodium chloride flush ? ? ? ?Patient Profile  ? ?Mr Bradley Powell is a 41 year old with a history of combined systolic/diastolic heart failure, He underwent atrial flutter ablation on 04/06/18. Subsequently had marked 1st AV block with progression to junctional rhythm.  obesity, HTN, A flutter, A flutter ablation, and gout.  ?  ?Admitted after syncope--> WCT and A/C HFrEF.  ?  ? ?Assessment/Plan  ? ?1. Syncope--> WCT on admit  ?-EP consulted for ICD.  ?-AA per EP--> Started on mexiletine 150 mg twice a day.   ?-Amiodarone taper per EP, 400 mg BID X 1 week, 200 mg BID X 2 weeks, then 200 mg daily ?-No driving 6 months.  ?-Back on Eliquis ?- Rhythm stable  ?-S/p ICD 04/11. No PTX on CXR. ? ? ?2. A/C HFrEF ?- Echo 1/20  EF 35-40% with mild RV dysfunction RVSP 40 mmHG ?- Echo (12/16/19): EF 40-45% Moderate RV dysfunction  ?- Echo 4/23 EF 35-40% Moderate RV dysfunction ?- cMRI 8/21 EF 45% + infiltrative process suggestive of possible amyloidosis ?- cMRI repeated 09/2021 EF 43% LGE concerning for sarcoid.  ?- PYP 4/22. Ratio 1.26 read as equivocal.  SPEP negative ?- He was seen by Dr. Broadus John for Genetics Counseling. Felt to have characteristics for a genetic CM but not to have pathogenic variant for HCM.  ?- Case d/w Dr. Broadus John. DNA sent for full cardiomyopathy panel including LMNA.  Work up c/w LMNA.  ?- Volume status much better. Down 57 pounds. Now dry with mild AKI. Hold diuretics  ?- Continue Spiro 25 mg daily  ?- Has been off b-blocker with bradycardia/heart block ?- Did not tolerate ARNi and ARB in the past d/t hypotension, N, V. Wil need to try again in near future ?- Continue jardiance 10 mg daily.  ?- Hypotension with Bidil in the past ? ? ?3. Acute on CKD Stage IIIa ?- due to overdiuresis ?- hold loop diuretics ?  ?4.Chronic AF/AFL ?- S/p a flutter ablation 04/06/18 with Dr. Lovena Le. ?- On eliquis ?- Rate controlled.  Continue amiodarone.  ?  ?5.  OSA ?- Continue CPAP ?  ?6. PVCs  ?- On mexilitene at home. Amio added here for VT ?- Better suppressed  ? ?7. Hypomagnesemia/ Hypokalemia ?- keep Mg > 2.0 and K > 4.0 ? ? ?8. Obesity ?- Body mass index is 36.2 kg/m?. ? ? ?Length of Stay: 11 ? ?Glori Bickers, MD  ?02/26/2022, 11:24 AM ? ?Advanced Heart Failure Team ?Pager [phone]  (M-F; 7a - 5p)  ?Please contact [facility] for night-coverage after hours (5p -7a ) and weekends on [email or web address] ? ? ?

## 2022-02-27 LAB — BASIC METABOLIC PANEL
Anion gap: 10 (ref 5–15)
BUN: 30 mg/dL — ABNORMAL HIGH (ref 6–20)
CO2: 26 mmol/L (ref 22–32)
Calcium: 8.9 mg/dL (ref 8.9–10.3)
Chloride: 97 mmol/L — ABNORMAL LOW (ref 98–111)
Creatinine, Ser: 1.55 mg/dL — ABNORMAL HIGH (ref 0.61–1.24)
GFR, Estimated: 58 mL/min — ABNORMAL LOW (ref >60)
Glucose, Bld: 97 mg/dL (ref 70–99)
Potassium: 3.2 mmol/L — ABNORMAL LOW (ref 3.5–5.1)
Sodium: 133 mmol/L — ABNORMAL LOW (ref 135–145)

## 2022-02-27 LAB — CBC
HCT: 48.4 % (ref 39.0–52.0)
Hemoglobin: 15.9 g/dL (ref 13.0–17.0)
MCH: 29 pg (ref 26.0–34.0)
MCHC: 32.9 g/dL (ref 30.0–36.0)
MCV: 88.2 fL (ref 80.0–100.0)
Platelets: 211 10*3/uL (ref 150–400)
RBC: 5.49 MIL/uL (ref 4.22–5.81)
RDW: 12.6 % (ref 11.5–15.5)
WBC: 7.2 10*3/uL (ref 4.0–10.5)
nRBC: 0 % (ref 0.0–0.2)

## 2022-02-27 LAB — MAGNESIUM: Magnesium: 2.2 mg/dL (ref 1.7–2.4)

## 2022-02-27 MED ORDER — AMIODARONE HCL 200 MG PO TABS: 200.0000 mg | ORAL_TABLET | Freq: Two times a day (BID) | ORAL | 1 refills | Status: DC

## 2022-02-27 MED ORDER — POTASSIUM CHLORIDE CRYS ER 20 MEQ PO TBCR: 40.0000 meq | EXTENDED_RELEASE_TABLET | Freq: Two times a day (BID) | ORAL | 2 refills | Status: DC

## 2022-02-27 MED ORDER — MEXILETINE HCL 150 MG PO CAPS: 150.0000 mg | ORAL_CAPSULE | Freq: Two times a day (BID) | ORAL | 5 refills | Status: DC

## 2022-02-27 MED ORDER — POTASSIUM CHLORIDE CRYS ER 20 MEQ PO TBCR
80.0000 meq | EXTENDED_RELEASE_TABLET | Freq: Once | ORAL | Status: AC
  Administered 2022-02-27: 80 meq via ORAL
  Filled 2022-02-27: qty 4

## 2022-02-27 MED ORDER — TORSEMIDE 20 MG PO TABS
60.0000 mg | ORAL_TABLET | Freq: Every day | ORAL | 2 refills | Status: DC
Start: 2022-02-27 — End: 2022-04-12

## 2022-02-27 NOTE — Discharge Summary (Signed)
? ?Advanced Heart Failure Team ? ?Discharge Summary  ? ?Patient ID: Bradley Powell ?MRN: [id number], DOB/AGE: September 12, 1981 41 y.o. Admit date: 02/15/2022 ?D/C date:     02/27/2022  ? ?Primary Discharge Diagnoses:  ?1. Ventricular tachycardia ?2. 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 CKD3a ?4. Chronic atrial fib/flutter ?5. Frequent PVCs ?6. OSA ?7. Hypokalemia ?8. Hypoomagnesemia ?9. Hyponatremia ? ? ?Hospital Course:  ? ? Bradley Powell is a 41 year old with a history of combined systolic/diastolic heart failure, He underwent atrial flutter ablation on 04/06/18. Subsequently had marked 1st AV block with progression to junctional rhythm. esity, HTN, A flutter, A flutter ablation, and gout.  ?  ?Back in 2021 concern for amyloid versus sarcoid. Genetic testing [id number] "uncertain variant for HCM. Negative for TTR" .Over the last year he had PYP, MRI, and genetic testing. CMRI suggestive amyloid. PYP equivocal. ?  ?On 4/4.collapsed while at [facility] . EMS called. WCT c/w VT. Given 20 mg IV cardizem followed by 10 mg . Developed bradycardia and required pacing. On arrival rate was in the 50s. Pacing pads removed and remained bradycardic. Lactic acid 3.4. Mag 1.8, HS Trop 24.  CT head normal. Had massive volume overload on exam ? ?On 4/6 had recurrent sustained VT w/ rates 200-210 converted with IV amio and moved to ICU. Diuresed over 50 pounds with IV lasix. Underwent BosSci ICD implant on 4/12. Eliquis held for several days then restarted. IV amio switched to po (already on mexilitene for PVCs) ? ?Genetic testing sent for possible LMNA cardiomyopathy and came back positive. Will need family screening.  ? ?Felt ready for d/c on 02/27/22. Electrolytes supped prior to d/c.  ? ?Notified of no driving x 6 months  ? ? ?Discharge Vitals: Blood pressure 119/79, pulse 92, temperature 97.7 ?F (36.5 ?C), temperature source Oral, resp. rate 20, height 5\' 8"  (1.727 m), weight 108 kg, SpO2 98 %. ?General:  Well  appearing. No resp difficulty ?HEENT: normal ?Neck: supple. no JVD. Carotids 2+ bilat; no bruits. No lymphadenopathy or thryomegaly appreciated. ?Cor: PMI nondisplaced. Regular rate & rhythm. No rubs, gallops or murmurs. ICD site stable  ?Lungs: clear ?Abdomen: obese soft, nontender, nondistended. No hepatosplenomegaly. No bruits or masses. Good bowel sounds. ?Extremities: no cyanosis, clubbing, rash, edema ?Neuro: alert & orientedx3, cranial nerves grossly intact. moves all 4 extremities w/o difficulty. Affect pleasant ? ? ?Labs: ?Lab Results  ?Component Value Date  ? WBC 7.2 02/27/2022  ? HGB 15.9 02/27/2022  ? HCT 48.4 02/27/2022  ? MCV 88.2 02/27/2022  ? PLT 211 02/27/2022  ?  ?Recent Labs  ?Lab 02/27/22 ?0323  ?NA 133*  ?K 3.2*  ?CL 97*  ?CO2 26  ?BUN 30*  ?CREATININE 1.55*  ?CALCIUM 8.9  ?GLUCOSE 97  ? ?No results found for: CHOL, HDL, LDLCALC, TRIG ?BNP (last 3 results) ?Recent Labs  ?  06/18/21 ?1218 08/11/21 ?1652 02/15/22 ?1027  ?BNP 315.5* 198.8* 202.1*  ? ? ?ProBNP (last 3 results) ?No results for input(s): PROBNP in the last 8760 hours. ? ? ?Diagnostic Studies/Procedures  ? ?No results found. ? ?Discharge Medications  ? ?Allergies as of 02/27/2022   ? ?   Reactions  ? Entresto [sacubitril-valsartan] Nausea And Vomiting, Other (See Comments)  ? Lightheaded, fainting  ? ?  ? ?  ?Medication List  ?  ? ?STOP taking these medications   ? ?carvedilol 3.125 MG tablet ?Commonly known as: COREG ?  ?promethazine 12.5 MG suppository ?Commonly known as: Promethegan ?  ? ?  ? ?  TAKE these medications   ? ?allopurinol 100 MG tablet ?Commonly known as: ZYLOPRIM ?Take 2 tablets (200 mg total) by mouth daily. Please contact PCP for further refills ?  ?amiodarone 200 MG tablet ?Commonly known as: PACERONE ?Take 1 tablet (200 mg total) by mouth 2 (two) times daily. ?  ?Eliquis 5 MG Tabs tablet ?Generic drug: apixaban ?TAKE 1 TABLET BY MOUTH TWICE A DAY ?What changed: how much to take ?  ?esomeprazole 40 MG capsule ?Commonly  known as: [place] ?Take 40 mg by mouth 2 (two) times daily. ?  ?Jardiance 10 MG Tabs tablet ?Generic drug: empagliflozin ?TAKE 1 TABLET BY MOUTH EVERY DAY BEFORE BREAKFAST. ?What changed: See the new instructions. ?  ?mexiletine 150 MG capsule ?Commonly known as: MEXITIL ?Take 1 capsule (150 mg total) by mouth every 12 (twelve) hours. ?  ?Mitigare 0.6 MG Caps ?Generic drug: Colchicine ?Take 0.6 mg by mouth daily as needed (gout flare). ?  ?potassium chloride SA 20 MEQ tablet ?Commonly known as: [place]-Con M20 ?Take 2 tablets (40 mEq total) by mouth 2 (two) times daily. ?What changed: See the new instructions. ?  ?spironolactone 25 MG tablet ?Commonly known as: ALDACTONE ?TAKE 1 TABLET BY MOUTH EVERYDAY AT BEDTIME ?What changed: See the new instructions. ?  ?sucralfate 1 g tablet ?Commonly known as: CARAFATE ?Take 1 g by mouth daily. ?  ?torsemide 20 MG tablet ?Commonly known as: DEMADEX ?Take 3 tablets (60 mg total) by mouth daily. ?What changed:  ?how much to take ?additional instructions ?  ? ?  ? ?  ?  ? ? ?  ?Discharge Care Instructions  ?(From admission, onward)  ?  ? ? ?  ? ?  Start     Ordered  ? 02/27/22 0000  Discharge wound care:       ?Comments: Per instructions in AVS.  ? 02/27/22 1152  ? ?  ?  ? ?  ? ? ?Disposition  ? ?The patient will be discharged in stable condition to home. ?Discharge Instructions   ? ? Diet - low sodium heart healthy   Complete by: As directed ?  ? Discharge wound care:   Complete by: As directed ?  ? Per instructions in AVS.  ? Increase activity slowly   Complete by: As directed ?  ? ?  ? ? Follow-up Information   ? ? [facility] Follow up.   ?Specialty: Cardiology ?Why: Appointment for wound check scheduled for 03/09/2022 at 2:40pm. ?Contact information: ?[address], [address] ?[place] ?[phone] ? ?  ?  ? ? [facility] Follow up.   ?Specialty: Cardiology ?Why: Wound check appointment scheduled for 03/09/2022  at 2:40pm. ?Contact information: ?[address], [address] ?[place] ?[phone] ? ?  ?  ? ?  ?  ? ?  ?  ? ? ?Duration of Discharge Encounter: Greater than 35 minutes  ? ?Signed, ?Glori Bickers  MD ?02/27/2022, 12:11 PM  ?

## 2022-03-09 ENCOUNTER — Ambulatory Visit (INDEPENDENT_AMBULATORY_CARE_PROVIDER_SITE_OTHER): Payer: 59

## 2022-03-09 DIAGNOSIS — I428 Other cardiomyopathies: Secondary | ICD-10-CM | POA: Diagnosis not present

## 2022-03-09 DIAGNOSIS — Z95810 Presence of automatic (implantable) cardiac defibrillator: Secondary | ICD-10-CM

## 2022-03-09 NOTE — Patient Instructions (Addendum)

## 2022-03-09 NOTE — Progress Notes (Signed)
?  ? ?Advanced Heart Failure Clinic Note ? ? ?PCP:  Seward Carol, MD  ?GI : Dr Camie Patience.  ?HF Cardiologist: Dr. Haroldine Laws ? ?Chief Complaint: Heart Failure follow up ?  ?HPI: ?Bradley Powell is a 41 y.o. male with a history of combined systolic/diastolic heart failure,  obesity, HTN, and gout.  ? ?Admitted 4/13 through 03/07/17 with marked volume overload. EF 40-45%. Diuresed with IV lasix tid + metolazone. Had RHC/LHC as noted below. Overall diuresed 45 pounds. Discharge weight was 281 pounds.  ? ? ?He underwent atrial flutter ablation on 04/06/18. Subsequently had marked 1st AV block with progression to junctional rhythm. Saw Dr. Lovena Le who wanted to follow it. Suggested cutting back carvedilol. ? ?Previously Bidil stopped due to hypotension. ? ?Admitted  8/21 with intractable N/V of unclear etiology, hypotension and AKI. Creatinine peaked at 4.5. Back to 1.6 at d/c. Has been followed by GI.  ? ?cMRI 7/21 EF 45% suspect infiltrative CM. ECV 55% ? ?Volume overloaded at clinic f/u 6/22, weight up 50 lbs. Misunderstood metolazone directions and took for 5 days. Weight down 65 lbs and required IVF in clinic. ? ?Follow up 9/22, weight up and down. He adjusts torsemide as needed. Considering endomyocardial bx.  ? ?Admitted 4/23 with syncope and collapse, found to be in [facility] c/w VT. Developed bradycardia and required pacing after given IV cardizem. On arrival rate was in the 50s. Had massive volume overload on exam. Had recurrent sustained VT w/ rates 200-210, converted with IV amio and moved to ICU. Echo showed EF 35-40%, LV moderately down, RV moderately reduced. Diuresed over 50 pounds with IV lasix. Underwent BosSci ICD implant. IV amio switched to po (already on mexilitene for PVCs). 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 No driving x 6 months. Discharged home, weight 238 lbs (down 57 lbs). ? ?Today he returns for post hospital HF follow up. Overall feeling fine. Walking around the neighborhood some,  no SOB if he walks slowly. Occasional positional dizziness. Has some leg swelling and noticed weight is going back up. Denies palpitations, abnormal bleeding, CP, or PND/Orthopnea. Appetite ok. No fever or chills. Weight at home 250 pounds. Taking all medications. Tries to watch salt intake, remains thirsty but trying to keep fluids < 36-38 oz. Asking about unna wraps if he needs them. Compliant with CPAP. ? ?Cardiac Studies ?- Echo (4/23): EF 35-40%, moderate LV dysfunction, RV moderately reduced, mild to moderate MR, moderate TR, posterior pericardial effusion ? ?- cMRI (7/21): ?1. Findings suggestive of infiltrative cardiomyopathy. Native T1 ECV ?55%, with diffuse post contrast delayed myocardial enhancement and ?abnormal T1 myocardial nulling kinetics, consistent with a diagnosis ?of cardiac amyloidosis. ?2.  Upper limit of normal biventricular chamber size. ?3. Mildly reduced biventricular systolic function. LVEF 45%, RVEF ?44%. ?4. Cannot exclude possible anomalous pulmonary venous drainage in to ?the mid SVC. Seen on axial HASTE image series 3 image 4, but ?evaluation limited due to poor spatial resolution. Consider ECG ?gated CTA chest for further evaluation. ? ?- Echo 2/21: EF 40-45%  ?- Echo 1/20: EF 35-40% ?- Echo 9/18: EF ~25-30%.  ?- Echo 3/19: EF 45-50% ? ?- Holter 7/18: 6% PVCs ?- Sleep study (10/18): AHI 26/hr ? ?- RHC (2/21): ?RA = 14 ?RV = 43/15 ?PA = 43/21 (31) ?PCW = 15 ?Fick cardiac output/index = 5.7/2.4 ?PVR = 2.8 WU ?Ao sat = 97% ?PA sat = 73%, 75% ?SVC sat = 67% ?  ?- R/LHC (4/18): ?AO = 109/86 (97) ?LV =  101/28 ?RA =  29 ?RV = 50/22 ?PA = 55/24 (38) ?PCW = 26 ?Fick cardiac output/index = 6.4/2.6 ?Thermal CO/CI = 4.9/2.0 ?PVR = 2.5 WU ?FA sat = 98% ?PA sat = 69%, 71% ?SVC sat = 82%, 82% ?IVC sat = 66%  ? ?Assessment: ?1. Normal coronary arteries  ?2. Mild to moderate pulmonary HTN with normal PVR ?3. Biventricular volume overload with R > L heart failure ?4. Moderately decreased CO by  thermodilution  ?5. 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 No intra-cardiac shunt ? ?Past Medical History:  ?Diagnosis Date  ? Atrial flutter ([place])   ? a. s/p ablation 03/2018.  ? Chronic combined systolic and diastolic CHF (congestive heart failure) ([place])   ? History of esophagogastroduodenoscopy (EGD)   ? Morbid obesity ([place])   ? NICM (nonischemic cardiomyopathy) ([place])   ? PVC's (premature ventricular contractions)   ? Sleep apnea   ? ?Past Surgical History:  ?Procedure Laterality Date  ? A-FLUTTER ABLATION N/A 04/06/2018  ? Procedure: A-FLUTTER ABLATION;  Surgeon: Evans Lance, MD;  Location: [place] [facility];  Service: Cardiovascular;  Laterality: N/A;  ? ESOPHAGEAL BRUSHING  06/19/2020  ? Procedure: ESOPHAGEAL BRUSHING;  Surgeon: Wilford Corner, MD;  Location: [place];  Service: Endoscopy;;  ? ESOPHAGOGASTRODUODENOSCOPY (EGD) WITH PROPOFOL Left 06/19/2020  ? Procedure: ESOPHAGOGASTRODUODENOSCOPY (EGD) WITH PROPOFOL;  Surgeon: Wilford Corner, MD;  Location: [place];  Service: Endoscopy;  Laterality: Left;  ? ICD IMPLANT N/A 02/22/2022  ? Procedure: ICD IMPLANT;  Surgeon: Evans Lance, MD;  Location: [place] [facility];  Service: Cardiovascular;  Laterality: N/A;  ? NO PAST SURGERIES    ? RIGHT HEART CATH N/A 12/20/2019  ? Procedure: RIGHT HEART CATH;  Surgeon: Jolaine Artist, MD;  Location: [place] [facility];  Service: Cardiovascular;  Laterality: N/A;  ? RIGHT/LEFT HEART CATH AND CORONARY ANGIOGRAPHY N/A 03/01/2017  ? Procedure: Right/Left Heart Cath and Coronary Angiography;  Surgeon: Jolaine Artist, MD;  Location: [place] [facility];  Service: Cardiovascular;  Laterality: N/A;  ? ?Current Outpatient Medications  ?Medication Sig Dispense Refill  ? allopurinol (ZYLOPRIM) 100 MG tablet Take 2 tablets (200 mg total) by mouth daily. Please contact PCP for further refills 60 tablet 0  ? amiodarone (PACERONE)  200 MG tablet Take 1 tablet (200 mg total) by mouth 2 (two) times daily. 60 tablet 1  ? ELIQUIS 5 MG TABS tablet TAKE 1 TABLET BY MOUTH TWICE A DAY 180 tablet 3  ? esomeprazole (NEXIUM) 40 MG capsule Take 40 mg by mouth 2 (two) times daily.    ? JARDIANCE 10 MG TABS tablet TAKE 1 TABLET BY MOUTH EVERY DAY BEFORE BREAKFAST. 30 tablet 11  ? mexiletine (MEXITIL) 150 MG capsule Take 1 capsule (150 mg total) by mouth every 12 (twelve) hours. 60 capsule 5  ? MITIGARE 0.6 MG CAPS Take 0.6 mg by mouth daily as needed (gout flare). 30 capsule 5  ? potassium chloride SA (KLOR-CON M20) 20 MEQ tablet Take 2 tablets (40 mEq total) by mouth 2 (two) times daily. 120 tablet 2  ? spironolactone (ALDACTONE) 25 MG tablet TAKE 1 TABLET BY MOUTH EVERYDAY AT BEDTIME 90 tablet 3  ? sucralfate (CARAFATE) 1 g tablet Take 1 g by mouth daily.     ? torsemide (DEMADEX) 20 MG tablet Take 3 tablets (60 mg total) by mouth daily. 90 tablet 2  ? ?No current  facility-administered medications for this encounter.  ? ? ?Allergies:   Entresto [sacubitril-valsartan]  ? ?Social History:  The patient  reports that he has never smoked. He has never used smokeless tobacco. He reports current alcohol use. He reports that he does not use drugs.  ? ?Family History:  The patient's family history includes CVA in his mother; Gout in his father; Hypertension in his father; Multiple sclerosis in his mother; Seizures in his mother.  ? ?ROS:  Please see the history of present illness.   All other systems are personally reviewed and negative.  ? ?BP 104/74   Pulse 71   Wt 115.8 kg   SpO2 100%   BMI 38.80 kg/m?  ? ?Recent Labs: ?02/15/2022: ALT 25; B Natriuretic Peptide 202.1 ?02/27/2022: BUN 30; Creatinine, Ser 1.55; Hemoglobin 15.9; Magnesium 2.2; Platelets 211; Potassium 3.2; Sodium 133  ?Personally reviewed  ? ?Wt Readings from Last 3 Encounters:  ?03/11/22 115.8 kg  ?02/27/22 108 kg  ?08/11/21 (P) 122.5 kg  ?  ?Physical Exam:   ?General:  NAD. No resp difficulty,  walked into clinic ?HEENT: Normal ?Neck: Supple. JVP 6-7. Carotids 2+ bilat; no bruits. No lymphadenopathy or thryomegaly appreciated. ?Cor: PMI nondisplaced. Regular rate & rhythm. No rubs, gallops, 2/6 + TR

## 2022-03-10 LAB — CUP PACEART INCLINIC DEVICE CHECK
Date Time Interrogation Session: 20230426000000
HighPow Impedance: 62 Ohm
Implantable Lead Implant Date: 20230411
Implantable Lead Location: 753860
Implantable Lead Model: 138
Implantable Lead Serial Number: 305338
Implantable Pulse Generator Implant Date: 20230411
Lead Channel Impedance Value: 464 Ohm
Lead Channel Pacing Threshold Amplitude: 1.5 V
Lead Channel Pacing Threshold Pulse Width: 0.4 ms
Lead Channel Sensing Intrinsic Amplitude: 6.4 mV
Lead Channel Setting Pacing Amplitude: 3.5 V
Lead Channel Setting Pacing Pulse Width: 0.4 ms
Lead Channel Setting Sensing Sensitivity: 0.6 mV
Pulse Gen Serial Number: 216862

## 2022-03-10 NOTE — Progress Notes (Signed)
Wound check appointment. Steri-strips removed. Wound without redness or edema. Incision edges approximated, wound well healed. Normal device function. sensing, and impedances consistent with implant measurements. Threshold slightly elevated from implant. Discussed with Dr. Lalla Brothers and industry. Will do a manual check 03/24/22. Device programmed at 3.5V for extra safety margin until 3 month visit. Histogram distribution appropriate for patient and level of activity. No ventricular arrhythmias noted. Patient educated about wound care, arm mobility, lifting restrictions, shock plan. Patient enrolled in remote monitoring with next transmission 05/25/22. ROV in 3 months with implanting physician. ?

## 2022-03-11 ENCOUNTER — Encounter (HOSPITAL_COMMUNITY): Payer: Self-pay

## 2022-03-11 ENCOUNTER — Ambulatory Visit (HOSPITAL_COMMUNITY)
Admit: 2022-03-11 | Discharge: 2022-03-11 | Disposition: A | Discharge status: Home / Self Care | Payer: 59 | Attending: Family Medicine | Admitting: Family Medicine

## 2022-03-11 VITALS — BP 104/74 | HR 71 | Wt 255.2 lb

## 2022-03-11 DIAGNOSIS — Z79899 Other long term (current) drug therapy: Secondary | ICD-10-CM | POA: Diagnosis not present

## 2022-03-11 DIAGNOSIS — Z862 Personal history of diseases of the blood and blood-forming organs and certain disorders involving the immune mechanism: Secondary | ICD-10-CM | POA: Insufficient documentation

## 2022-03-11 DIAGNOSIS — M7989 Other specified soft tissue disorders: Secondary | ICD-10-CM | POA: Insufficient documentation

## 2022-03-11 DIAGNOSIS — I4891 Unspecified atrial fibrillation: Secondary | ICD-10-CM | POA: Diagnosis not present

## 2022-03-11 DIAGNOSIS — I3139 Other pericardial effusion (noninflammatory): Secondary | ICD-10-CM | POA: Insufficient documentation

## 2022-03-11 DIAGNOSIS — R9431 Abnormal electrocardiogram [ECG] [EKG]: Secondary | ICD-10-CM | POA: Insufficient documentation

## 2022-03-11 DIAGNOSIS — Z7901 Long term (current) use of anticoagulants: Secondary | ICD-10-CM | POA: Insufficient documentation

## 2022-03-11 DIAGNOSIS — Z95810 Presence of automatic (implantable) cardiac defibrillator: Secondary | ICD-10-CM | POA: Diagnosis not present

## 2022-03-11 DIAGNOSIS — N1831 Chronic kidney disease, stage 3a: Secondary | ICD-10-CM | POA: Insufficient documentation

## 2022-03-11 DIAGNOSIS — I493 Ventricular premature depolarization: Secondary | ICD-10-CM | POA: Diagnosis not present

## 2022-03-11 DIAGNOSIS — I272 Pulmonary hypertension, unspecified: Secondary | ICD-10-CM | POA: Diagnosis not present

## 2022-03-11 DIAGNOSIS — R42 Dizziness and giddiness: Secondary | ICD-10-CM | POA: Diagnosis not present

## 2022-03-11 DIAGNOSIS — Z7984 Long term (current) use of oral hypoglycemic drugs: Secondary | ICD-10-CM | POA: Diagnosis not present

## 2022-03-11 DIAGNOSIS — I081 Rheumatic disorders of both mitral and tricuspid valves: Secondary | ICD-10-CM | POA: Insufficient documentation

## 2022-03-11 DIAGNOSIS — G4733 Obstructive sleep apnea (adult) (pediatric): Secondary | ICD-10-CM | POA: Diagnosis not present

## 2022-03-11 DIAGNOSIS — I472 Ventricular tachycardia, unspecified: Secondary | ICD-10-CM | POA: Insufficient documentation

## 2022-03-11 DIAGNOSIS — I4892 Unspecified atrial flutter: Secondary | ICD-10-CM | POA: Insufficient documentation

## 2022-03-11 DIAGNOSIS — R55 Syncope and collapse: Secondary | ICD-10-CM | POA: Diagnosis not present

## 2022-03-11 DIAGNOSIS — Z8249 Family history of ischemic heart disease and other diseases of the circulatory system: Secondary | ICD-10-CM | POA: Diagnosis not present

## 2022-03-11 DIAGNOSIS — I130 Hypertensive heart and chronic kidney disease with heart failure and stage 1 through stage 4 chronic kidney disease, or unspecified chronic kidney disease: Secondary | ICD-10-CM | POA: Diagnosis not present

## 2022-03-11 DIAGNOSIS — I482 Chronic atrial fibrillation, unspecified: Secondary | ICD-10-CM | POA: Insufficient documentation

## 2022-03-11 DIAGNOSIS — I5042 Chronic combined systolic (congestive) and diastolic (congestive) heart failure: Secondary | ICD-10-CM | POA: Diagnosis not present

## 2022-03-11 DIAGNOSIS — Z6838 Body mass index (BMI) 38.0-38.9, adult: Secondary | ICD-10-CM | POA: Diagnosis not present

## 2022-03-11 LAB — BASIC METABOLIC PANEL
Anion gap: 8 (ref 5–15)
BUN: 22 mg/dL — ABNORMAL HIGH (ref 6–20)
CO2: 28 mmol/L (ref 22–32)
Calcium: 8.7 mg/dL — ABNORMAL LOW (ref 8.9–10.3)
Chloride: 103 mmol/L (ref 98–111)
Creatinine, Ser: 1.69 mg/dL — ABNORMAL HIGH (ref 0.61–1.24)
GFR, Estimated: 53 mL/min — ABNORMAL LOW (ref >60)
Glucose, Bld: 97 mg/dL (ref 70–99)
Potassium: 4 mmol/L (ref 3.5–5.1)
Sodium: 139 mmol/L (ref 135–145)

## 2022-03-11 LAB — CBC
HCT: 47.6 % (ref 39.0–52.0)
Hemoglobin: 15.6 g/dL (ref 13.0–17.0)
MCH: 29.4 pg (ref 26.0–34.0)
MCHC: 32.8 g/dL (ref 30.0–36.0)
MCV: 89.8 fL (ref 80.0–100.0)
Platelets: 198 10*3/uL (ref 150–400)
RBC: 5.3 MIL/uL (ref 4.22–5.81)
RDW: 13.2 % (ref 11.5–15.5)
WBC: 6.6 10*3/uL (ref 4.0–10.5)
nRBC: 0 % (ref 0.0–0.2)

## 2022-03-11 LAB — MAGNESIUM: Magnesium: 2 mg/dL (ref 1.7–2.4)

## 2022-03-11 LAB — BRAIN NATRIURETIC PEPTIDE: B Natriuretic Peptide: 179.8 pg/mL — ABNORMAL HIGH (ref 0.0–100.0)

## 2022-03-11 MED ORDER — METOLAZONE 2.5 MG PO TABS: 2.5000 mg | ORAL_TABLET | Freq: Every day | ORAL | 0 refills | Status: DC

## 2022-03-11 NOTE — Patient Instructions (Signed)
Medication Changes: ? ?Take Metolazone 2.5 mg tomorrow with all your normal medications. ? ?Take an extra 22meq (2 tab) of potassium ? ?Lab Work: ? ?Labs done today, your results will be available in MyChart, we will contact you for abnormal readings. ? ? ?Testing/Procedures: ? ?Repeat blood work in 10 days  ? ?Referrals: ? ?none ? ?Special Instructions // Education: ? ?Do the following things EVERYDAY: ?Weigh yourself in the morning before breakfast. Write it down and keep it in a log. ?Take your medicines as prescribed ?Eat low salt foods--Limit salt (sodium) to 2000 mg per day.  ?Stay as active as you can everyday ?Limit all fluids for the day to less than 2 liters ? ? ?Follow-Up in: as scheduled  ? ?At the [facility], you and your health needs are our priority. We have a designated team specialized in the treatment of Heart Failure. This Care Team includes your primary Heart Failure Specialized Cardiologist (physician), Advanced Practice Providers (APPs- Physician Assistants and Nurse Practitioners), and Pharmacist who all work together to provide you with the care you need, when you need it.  ? ?You may see any of the following providers on your designated Care Team at your next follow up: ? ?Dr Glori Bickers ?Dr Loralie Champagne ?Darrick Grinder, NP ?Lyda Jester, PA ?Jessica Milford,NP ?Marlyce Huge, PA ?Audry Riles, PharmD ? ? ?Please be sure to bring in all your medications bottles to every appointment.  ? ?Need to Contact us: ? ?If you have any questions or concerns before your next appointment please send [place] a message through [place] or call our office at [phone].   ? ?TO LEAVE A MESSAGE FOR THE NURSE SELECT OPTION 2, PLEASE LEAVE A MESSAGE INCLUDING: ?YOUR NAME ?DATE OF BIRTH ?CALL BACK NUMBER ?REASON FOR CALL**this is important as we prioritize the call backs ? ?YOU WILL RECEIVE A CALL BACK THE SAME DAY AS LONG AS YOU CALL BEFORE 4:00 PM ? ? ?

## 2022-03-15 ENCOUNTER — Ambulatory Visit: Payer: 59 | Admitting: Genetic Counselor

## 2022-03-21 ENCOUNTER — Other Ambulatory Visit (HOSPITAL_COMMUNITY): Payer: Self-pay

## 2022-03-21 ENCOUNTER — Other Ambulatory Visit (HOSPITAL_COMMUNITY): Payer: Self-pay | Admitting: Pharmacist

## 2022-03-21 ENCOUNTER — Other Ambulatory Visit: Payer: Self-pay | Admitting: Student

## 2022-03-21 ENCOUNTER — Other Ambulatory Visit (HOSPITAL_COMMUNITY): Payer: Self-pay | Admitting: *Deleted

## 2022-03-21 MED ORDER — EMPAGLIFLOZIN 10 MG PO TABS: ORAL_TABLET | ORAL | 11 refills | Status: DC

## 2022-03-21 MED ORDER — SPIRONOLACTONE 25 MG PO TABS: ORAL_TABLET | ORAL | 3 refills | Status: DC

## 2022-03-22 ENCOUNTER — Ambulatory Visit (HOSPITAL_COMMUNITY)
Admission: RE | Admit: 2022-03-22 | Discharge: 2022-03-22 | Disposition: A | Discharge status: Home / Self Care | Payer: 59 | Source: Ambulatory Visit | Attending: Cardiology | Admitting: Cardiology

## 2022-03-22 DIAGNOSIS — I5042 Chronic combined systolic (congestive) and diastolic (congestive) heart failure: Secondary | ICD-10-CM | POA: Diagnosis not present

## 2022-03-22 LAB — BASIC METABOLIC PANEL
Anion gap: 9 (ref 5–15)
BUN: 21 mg/dL — ABNORMAL HIGH (ref 6–20)
CO2: 29 mmol/L (ref 22–32)
Calcium: 9.2 mg/dL (ref 8.9–10.3)
Chloride: 99 mmol/L (ref 98–111)
Creatinine, Ser: 1.49 mg/dL — ABNORMAL HIGH (ref 0.61–1.24)
GFR, Estimated: >60 mL/min (ref >60)
Glucose, Bld: 86 mg/dL (ref 70–99)
Potassium: 4.1 mmol/L (ref 3.5–5.1)
Sodium: 137 mmol/L (ref 135–145)

## 2022-03-24 ENCOUNTER — Ambulatory Visit (INDEPENDENT_AMBULATORY_CARE_PROVIDER_SITE_OTHER): Payer: 59

## 2022-03-24 DIAGNOSIS — I428 Other cardiomyopathies: Secondary | ICD-10-CM | POA: Diagnosis not present

## 2022-03-24 LAB — CUP PACEART INCLINIC DEVICE CHECK
Date Time Interrogation Session: 20230511173931
HighPow Impedance: 67 Ohm
Implantable Lead Implant Date: 20230411
Implantable Lead Location: 753860
Implantable Lead Model: 138
Implantable Lead Serial Number: 305338
Implantable Pulse Generator Implant Date: 20230411
Lead Channel Impedance Value: 470 Ohm
Lead Channel Pacing Threshold Amplitude: 1.7 V
Lead Channel Pacing Threshold Pulse Width: 0.4 ms
Lead Channel Sensing Intrinsic Amplitude: 6.5 mV
Lead Channel Setting Pacing Amplitude: 3.5 V
Lead Channel Setting Pacing Pulse Width: 0.4 ms
Lead Channel Setting Sensing Sensitivity: 0.6 mV
Pulse Gen Serial Number: 216862

## 2022-03-24 NOTE — Progress Notes (Signed)
Repeat ICD check in clinic to check for RV threshold stabilization post implant. Normal device function. Thresholds and sensing consistent with previous device measurements at wound check. Impedance trends stable over time. No ventricular arrhythmias. Histogram distribution appropriate for patient and level of activity. No changes made this session. Device programmed at appropriate safety margins for new implant. Device programmed to optimize intrinsic conduction. Estimated longevity BOS. Pt enrolled in remote follow-up with next transmission 05/25/22. Patient education completed including shock plan. 91 day follow up as scheduled with Dr. Ladona Ridgel ?

## 2022-03-25 ENCOUNTER — Other Ambulatory Visit: Payer: Self-pay | Admitting: Internal Medicine

## 2022-03-31 ENCOUNTER — Encounter (HOSPITAL_COMMUNITY): Payer: Self-pay

## 2022-03-31 ENCOUNTER — Ambulatory Visit (HOSPITAL_COMMUNITY)
Admission: RE | Admit: 2022-03-31 | Discharge: 2022-03-31 | Disposition: A | Discharge status: Home / Self Care | Payer: 59 | Source: Ambulatory Visit | Attending: Physician Assistant | Admitting: Physician Assistant

## 2022-03-31 VITALS — BP 108/60 | HR 71 | Wt 261.4 lb

## 2022-03-31 DIAGNOSIS — Z7984 Long term (current) use of oral hypoglycemic drugs: Secondary | ICD-10-CM | POA: Insufficient documentation

## 2022-03-31 DIAGNOSIS — Z7901 Long term (current) use of anticoagulants: Secondary | ICD-10-CM | POA: Insufficient documentation

## 2022-03-31 DIAGNOSIS — I472 Ventricular tachycardia, unspecified: Secondary | ICD-10-CM | POA: Diagnosis not present

## 2022-03-31 DIAGNOSIS — R55 Syncope and collapse: Secondary | ICD-10-CM

## 2022-03-31 DIAGNOSIS — Z95810 Presence of automatic (implantable) cardiac defibrillator: Secondary | ICD-10-CM | POA: Insufficient documentation

## 2022-03-31 DIAGNOSIS — M109 Gout, unspecified: Secondary | ICD-10-CM | POA: Insufficient documentation

## 2022-03-31 DIAGNOSIS — I482 Chronic atrial fibrillation, unspecified: Secondary | ICD-10-CM | POA: Diagnosis not present

## 2022-03-31 DIAGNOSIS — Z79899 Other long term (current) drug therapy: Secondary | ICD-10-CM | POA: Insufficient documentation

## 2022-03-31 DIAGNOSIS — I959 Hypotension, unspecified: Secondary | ICD-10-CM | POA: Insufficient documentation

## 2022-03-31 DIAGNOSIS — I5042 Chronic combined systolic (congestive) and diastolic (congestive) heart failure: Secondary | ICD-10-CM | POA: Diagnosis not present

## 2022-03-31 DIAGNOSIS — I459 Conduction disorder, unspecified: Secondary | ICD-10-CM | POA: Insufficient documentation

## 2022-03-31 DIAGNOSIS — I4892 Unspecified atrial flutter: Secondary | ICD-10-CM | POA: Diagnosis not present

## 2022-03-31 DIAGNOSIS — Z6839 Body mass index (BMI) 39.0-39.9, adult: Secondary | ICD-10-CM | POA: Diagnosis not present

## 2022-03-31 DIAGNOSIS — I130 Hypertensive heart and chronic kidney disease with heart failure and stage 1 through stage 4 chronic kidney disease, or unspecified chronic kidney disease: Secondary | ICD-10-CM | POA: Insufficient documentation

## 2022-03-31 DIAGNOSIS — E669 Obesity, unspecified: Secondary | ICD-10-CM | POA: Insufficient documentation

## 2022-03-31 DIAGNOSIS — I5020 Unspecified systolic (congestive) heart failure: Secondary | ICD-10-CM

## 2022-03-31 DIAGNOSIS — N1831 Chronic kidney disease, stage 3a: Secondary | ICD-10-CM | POA: Diagnosis not present

## 2022-03-31 DIAGNOSIS — G4733 Obstructive sleep apnea (adult) (pediatric): Secondary | ICD-10-CM | POA: Diagnosis not present

## 2022-03-31 LAB — COMPREHENSIVE METABOLIC PANEL
ALT: 38 U/L (ref 0–44)
AST: 31 U/L (ref 15–41)
Albumin: 3.1 g/dL — ABNORMAL LOW (ref 3.5–5.0)
Alkaline Phosphatase: 131 U/L — ABNORMAL HIGH (ref 38–126)
Anion gap: 7 (ref 5–15)
BUN: 14 mg/dL (ref 6–20)
CO2: 30 mmol/L (ref 22–32)
Calcium: 9.2 mg/dL (ref 8.9–10.3)
Chloride: 100 mmol/L (ref 98–111)
Creatinine, Ser: 1.72 mg/dL — ABNORMAL HIGH (ref 0.61–1.24)
GFR, Estimated: 52 mL/min — ABNORMAL LOW (ref >60)
Glucose, Bld: 97 mg/dL (ref 70–99)
Potassium: 4.3 mmol/L (ref 3.5–5.1)
Sodium: 137 mmol/L (ref 135–145)
Total Bilirubin: 3.6 mg/dL — ABNORMAL HIGH (ref 0.3–1.2)
Total Protein: 6.1 g/dL — ABNORMAL LOW (ref 6.5–8.1)

## 2022-03-31 LAB — BRAIN NATRIURETIC PEPTIDE: B Natriuretic Peptide: 213 pg/mL — ABNORMAL HIGH (ref 0.0–100.0)

## 2022-03-31 LAB — TSH: TSH: 10.491 u[IU]/mL — ABNORMAL HIGH (ref 0.350–4.500)

## 2022-03-31 MED ORDER — METOLAZONE 2.5 MG PO TABS: ORAL_TABLET | ORAL | 0 refills | Status: DC

## 2022-03-31 NOTE — Patient Instructions (Addendum)
Labs done today. We will contact you only if your labs are abnormal.  Tonight take 1 tablet of Metolazone with an extra 19meq(2 tablets) of Potassium  No other medication changes were made. Please continue all current medications as prescribed.  You have been referred to [facility]. They will contact you to schedule an appointment.   Please wear your compression hose daily, place them on as soon as you get up in the morning and remove before you go to bed at night. You can purchase these at [place]: [address] [place], [place] [address] [phone]  Your physician recommends that you schedule a follow-up appointment in: 1 week for a lab only appointment.   Your physician recommends that you keep your scheduled follow-up appointment as scheduled.  If you have any questions or concerns before your next appointment please send [place] a message through [place] or call our office at [phone].    TO LEAVE A MESSAGE FOR THE NURSE SELECT OPTION 2, PLEASE LEAVE A MESSAGE INCLUDING: YOUR NAME DATE OF BIRTH CALL BACK NUMBER REASON FOR CALL**this is important as we prioritize the call backs  YOU WILL RECEIVE A CALL BACK THE SAME DAY AS LONG AS YOU CALL BEFORE 4:00 PM   Do the following things EVERYDAY: Weigh yourself in the morning before breakfast. Write it down and keep it in a log. Take your medicines as prescribed Eat low salt foods--Limit salt (sodium) to 2000 mg per day.  Stay as active as you can everyday Limit all fluids for the day to less than 2 liters   At the [facility], you and your health needs are our priority. As part of our continuing mission to provide you with exceptional heart care, we have created designated Provider Care Teams. These Care Teams include your primary Cardiologist (physician) and Advanced Practice Providers (APPs- Physician Assistants and Nurse Practitioners) who all work together to provide you with the care you  need, when you need it.   You may see any of the following providers on your designated Care Team at your next follow up: Dr Glori Bickers Dr Haynes Kerns, NP Lyda Jester, [place] Audry Riles, PharmD   Please be sure to bring in all your medications bottles to every appointment.

## 2022-03-31 NOTE — Progress Notes (Signed)
ReDS Vest / Clip - 03/31/22 1500       ReDS Vest / Clip   Station Marker D    Ruler Value 32.5    ReDS Value Range Moderate volume overload    ReDS Actual Value 39

## 2022-03-31 NOTE — Progress Notes (Addendum)
Advanced Heart Failure Clinic Note   PCP:  Seward Carol, MD  GI : Dr Camie Patience.  HF Cardiologist: Dr. Haroldine Laws  Chief Complaint: Heart Failure follow up   HPI: Bradley Powell is a 41 y.o. male with a history of combined systolic/diastolic heart failure,  obesity, HTN, and gout.   Admitted 4/13 through 03/07/17 with marked volume overload. EF 40-45%. Diuresed with IV lasix tid + metolazone. Had RHC/LHC as noted below. Overall diuresed 45 pounds. Discharge weight was 281 pounds.    He underwent atrial flutter ablation on 04/06/18. Subsequently had marked 1st AV block with progression to junctional rhythm. Saw Dr. Lovena Le who wanted to follow it. Suggested cutting back carvedilol.  Previously Bidil stopped due to hypotension.  Admitted  8/21 with intractable N/V of unclear etiology, hypotension and AKI. Creatinine peaked at 4.5. Back to 1.6 at d/c. Has been followed by GI.   cMRI 7/21 EF 45% suspect infiltrative CM. ECV 55%  Volume overloaded at clinic f/u 6/22, weight up 50 lbs. Misunderstood metolazone directions and took for 5 days. Weight down 65 lbs and required IVF in clinic.  Follow up 9/22, weight up and down. He adjusts torsemide as needed. Considering endomyocardial bx.   Admitted 4/23 with syncope and collapse, found to be in [facility] c/w VT. Developed bradycardia and required pacing after given IV cardizem. On arrival rate was in the 50s. Had massive volume overload on exam. Had recurrent sustained VT w/ rates 200-210, converted with IV amio and moved to ICU. Echo showed EF 35-40%, LV moderately down, RV moderately reduced. Diuresed over 50 pounds with IV lasix. Underwent [employer] ICD implant. IV amio switched to po (already on mexilitene for PVCs). 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 No driving x 6 months. Discharged home, weight 238 lbs (down 57 lbs).  Saw Geneticist, Dr. Broadus John, earlier this month. His mother will be undergoing genetic testing.   Here today  for HF f/u. Home weight up 5 lb, 255 lb today. Takes 60 mg Torsemide daily, has been taking an extra 20 mg the last 4 days d/t weight gain and leg edema. Only notes slight improvement. He is wondering if he can get referral to a practice for [facility] boot placement. Has been very diligent about fluid and sodium intake. Using CPAP nightly. Does some walking outdoors but limited by fatigue. Enjoys swimming but has not been able to get back to that d/t restrictions post ICD implant. 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   Cardiac Studies - Echo (4/23): EF 35-40%, moderate LV dysfunction, RV moderately reduced, mild to moderate MR, moderate TR, posterior pericardial effusion  - cMRI (7/21): 1. Findings suggestive of infiltrative cardiomyopathy. Native T1 ECV 55%, with diffuse post contrast delayed myocardial enhancement and abnormal T1 myocardial nulling kinetics, consistent with a diagnosis of cardiac amyloidosis. 2.  Upper limit of normal biventricular chamber size. 3. Mildly reduced biventricular systolic function. LVEF 45%, RVEF 44%. 4. Cannot exclude possible anomalous pulmonary venous drainage in to the mid SVC. Seen on axial HASTE image series 3 image 4, but evaluation limited due to poor spatial resolution. Consider ECG gated CTA chest for further evaluation.  - Echo 2/21: EF 40-45%  - Echo 1/20: EF 35-40% - Echo 9/18: EF ~25-30%.  - Echo 3/19: EF 45-50%  - Holter 7/18: 6% PVCs - Sleep study (10/18): AHI 26/hr  - RHC (2/21): RA = 14 RV = 43/15 PA = 43/21 (31) PCW = 15 Fick cardiac  output/index = 5.7/2.4 PVR = 2.8 WU Ao sat = 97% PA sat = 73%, 75% SVC sat = 67%   - R/LHC (4/18): AO = 109/86 (97) LV =  101/28 RA =  29 RV = 50/22 PA = 55/24 (38) PCW = 26 Fick cardiac output/index = 6.4/2.6 Thermal CO/CI = 4.9/2.0 PVR = 2.5 WU FA sat = 98% PA sat = 69%, 71% SVC sat = 82%, 82% IVC sat = 66%   Assessment: 1. Normal coronary arteries  2. Mild to moderate pulmonary  HTN with normal PVR 3. Biventricular volume overload with R > L heart failure 4. Moderately decreased CO by thermodilution  5. 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 No intra-cardiac shunt  Past Medical History:  Diagnosis Date   Atrial flutter ([place])    a. s/p ablation 03/2018.   Chronic combined systolic and diastolic CHF (congestive heart failure) (HCC)    History of esophagogastroduodenoscopy (EGD)    Morbid obesity (HCC)    NICM (nonischemic cardiomyopathy) ([place])    PVC's (premature ventricular contractions)    Sleep apnea    Past Surgical History:  Procedure Laterality Date   A-FLUTTER ABLATION N/A 04/06/2018   Procedure: A-FLUTTER ABLATION;  Surgeon: Evans Lance, MD;  Location: [place] CV LAB;  Service: Cardiovascular;  Laterality: N/A;   ESOPHAGEAL BRUSHING  06/19/2020   Procedure: ESOPHAGEAL BRUSHING;  Surgeon: Wilford Corner, MD;  Location: [place];  Service: Endoscopy;;   ESOPHAGOGASTRODUODENOSCOPY (EGD) WITH PROPOFOL Left 06/19/2020   Procedure: ESOPHAGOGASTRODUODENOSCOPY (EGD) WITH PROPOFOL;  Surgeon: Wilford Corner, MD;  Location: [place];  Service: Endoscopy;  Laterality: Left;   ICD IMPLANT N/A 02/22/2022   Procedure: ICD IMPLANT;  Surgeon: Evans Lance, MD;  Location: [place] CV LAB;  Service: Cardiovascular;  Laterality: N/A;   NO PAST SURGERIES     RIGHT HEART CATH N/A 12/20/2019   Procedure: RIGHT HEART CATH;  Surgeon: Jolaine Artist, MD;  Location: [place] CV LAB;  Service: Cardiovascular;  Laterality: N/A;   RIGHT/LEFT HEART CATH AND CORONARY ANGIOGRAPHY N/A 03/01/2017   Procedure: Right/Left Heart Cath and Coronary Angiography;  Surgeon: Jolaine Artist, MD;  Location: [place] CV LAB;  Service: Cardiovascular;  Laterality: N/A;   Current Outpatient Medications  Medication Sig Dispense Refill   allopurinol (ZYLOPRIM) 100 MG tablet Take 2  tablets (200 mg total) by mouth daily. Please contact PCP for further refills 60 tablet 0   amiodarone (PACERONE) 200 MG tablet TAKE 1 TABLET BY MOUTH TWICE A DAY 60 tablet 1   ELIQUIS 5 MG TABS tablet TAKE 1 TABLET BY MOUTH TWICE A DAY 180 tablet 3   empagliflozin (JARDIANCE) 10 MG TABS tablet TAKE 1 TABLET BY MOUTH EVERY DAY BEFORE BREAKFAST. 30 tablet 11   esomeprazole (NEXIUM) 40 MG capsule Take 40 mg by mouth 2 (two) times daily.     mexiletine (MEXITIL) 150 MG capsule Take 1 capsule (150 mg total) by mouth every 12 (twelve) hours. (Patient taking differently: Take 150 mg by mouth every 12 (twelve) hours. Patient takes 1 tablet at 6 am and at 10-11 pm at night) 60 capsule 5   MITIGARE 0.6 MG CAPS Take 0.6 mg by mouth daily as needed (gout flare). 30 capsule 5   potassium chloride SA (KLOR-CON M20) 20 MEQ tablet Take 2 tablets (40 mEq total) by mouth 2 (two) times daily. 120 tablet 2   spironolactone (  ALDACTONE) 25 MG tablet TAKE 1 TABLET BY MOUTH EVERYDAY AT BEDTIME 90 tablet 3   sucralfate (CARAFATE) 1 g tablet Take 1 g by mouth daily.      torsemide (DEMADEX) 20 MG tablet Take 3 tablets (60 mg total) by mouth daily. 90 tablet 2   metolazone (ZAROXOLYN) 2.5 MG tablet Take as directed by the HF Clinic 10 tablet 0   No current facility-administered medications for this encounter.    Allergies:   Entresto [sacubitril-valsartan]   Social History:  The patient  reports that he has never smoked. He has never used smokeless tobacco. He reports current alcohol use. He reports that he does not use drugs.   Family History:  The patient's family history includes CVA in his mother; Gout in his father; Hypertension in his father; Multiple sclerosis in his mother; Seizures in his mother.   ROS:  Please see the history of present illness.   All other systems are personally reviewed and negative.   BP 108/60   Pulse 71   Wt 118.6 kg (261 lb 6.4 oz)   SpO2 98%   BMI 39.75 kg/m   Recent  Labs: 02/15/2022: ALT 25 03/11/2022: B Natriuretic Peptide 179.8; Hemoglobin 15.6; Magnesium 2.0; Platelets 198 03/22/2022: BUN 21; Creatinine, Ser 1.49; Potassium 4.1; Sodium 137  Personally reviewed   Wt Readings from Last 3 Encounters:  03/31/22 118.6 kg (261 lb 6.4 oz)  03/11/22 115.8 kg (255 lb 3.2 oz)  02/27/22 108 kg (238 lb 1.6 oz)    Physical Exam:   General:  No distress. Ambulated into clinic. HEENT: normal Neck: supple. JVP 8-10. Carotids 2+ bilat; no bruits.  Cor: PMI nondisplaced. Regular rate & rhythm. No rubs, gallops or murmurs. Lungs: clear Abdomen: soft, nontender, nondistended. No hepatosplenomegaly.  Extremities: no cyanosis, clubbing, rash, 2+ edema Neuro: alert & orientedx3, cranial nerves grossly intact. moves all 4 extremities w/o difficulty. Affect pleasant   ReDS 39%  HL index 1  ASSESSMENT AND PLAN: 1. Syncope/VT - 04/23 > secondary to VT - s/p ICD 4/23. Has f/u with EP in July. - Continue mexiletine 150 mg bid   - Continue amiodarone 200 mg BID - No driving 6 months.  - Continue Eliquis   2. Chronic HFrEF - Echo (1/20):  EF 35-40% with mild RV dysfunction RVSP 40 mmHG - Echo (2/21): EF 40-45% Moderate RV dysfunction  - PYP (4/22). Ratio 1.26 read as equivocal.  SPEP negative - cMRI repeated (11/22): LVEF 43%, RVEF 44%, LGE concerning for sarcoid.  - cMRI (8/21): EF 45% + infiltrative process suggestive of possible amyloidosis - Echo (4/23): EF 35-40% Moderate RV dysfunction - He was seen by Dr. Broadus John for Genetics Counseling. Felt to have characteristics for a genetic CM but not to have pathogenic variant for HCM.  - Has seen geneticist, Dr. Broadus John. Work up c/w LMNA. His mother will be undergoing genetic testing in the near future. - NYHA II. Volume up on exam, weight up 5 lb from last visit. ReDS 39%, ? HL index only 1.  - Take metolazone 2.5 mg + extra 40 KCL w/ tomorrow's dose of torsemide. - Typically takes Torsemide 60 mg daily, now taking 80  mg daily with modest improvement in edema. - Continue spiro 25 mg daily  - Continue Jardiance 10 mg daily.  - Off b-blocker with bradycardia/heart block - Did not tolerate ARNi and ARB in the past d/t hypotension, n/v. Will need to try again in future. BP soft today - No  Bidil (hypotension in the past) - Given prescription for compression stockings. Referred to Dr. Lorelei Pont at his request for application of UNNA boots. - Labs today, BMET again in 1 week   3. CKD Stage IIIa - Last SCr 1.5. - BMET today.   4.Chronic AF/AFL - S/p AFL ablation 5/19 with Dr. Lovena Le. - Continue Eliquis 5 mg bid. No bleeding issues. - Continue amiodarone 200 mg BID. Rate controlled. LFTs and TSH today.   5.  OSA - Continue CPAP.   6. PVCs/VT - Continue mexiletine + amiodarone. - Better suppressed.  - No ventricular arrhythmias on device check 05/11   7. Obesity - Body mass index is 39.75 kg/m.   Okay to return to work. No heavy lifting greater than 30 lb.    Follow-up: He will call if volume not improving with dose of metolazone, may need to arrange for once weekly metolazone or sooner f/u. Lab appt 1 week, f/u with Dr. Haroldine Laws as scheduled July 21st  Marlyce Huge, [place] 03/31/22

## 2022-04-01 ENCOUNTER — Encounter (HOSPITAL_COMMUNITY): Payer: Self-pay | Admitting: *Deleted

## 2022-04-01 ENCOUNTER — Telehealth (HOSPITAL_COMMUNITY): Payer: Self-pay | Admitting: Physician Assistant

## 2022-04-01 NOTE — Telephone Encounter (Signed)
Please update patient I spoke with Dr. Haroldine Laws. Okay to return to work. Would avoid heavy lifting greater than 30 lb. Needs to follow any restrictions provided by device team post ICD implant.

## 2022-04-01 NOTE — Addendum Note (Signed)
Encounter addended by: Joette Catching, PA-C on: 04/01/2022 6:53 AM  Actions taken: Clinical Note Signed

## 2022-04-01 NOTE — Telephone Encounter (Signed)
Pt aware, letter completed and emailed to him

## 2022-04-04 ENCOUNTER — Other Ambulatory Visit (HOSPITAL_COMMUNITY): Payer: Self-pay | Admitting: *Deleted

## 2022-04-04 DIAGNOSIS — I5020 Unspecified systolic (congestive) heart failure: Secondary | ICD-10-CM

## 2022-04-04 NOTE — Progress Notes (Signed)
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 I explained to him that his LMNA variant is a novel variant that has not been previously reported in patients with DCM and/or CCD. This variant is not found in the general population databases indicating that this is a rare variant. It is predicted that this variant is a loss of function mutation. Loss of function mutations in LMNA gene is associated with Dilated cardiomyopathy and conduction system disease with minimal skeletal muscle involvement. Individuals harboring LMNA mutations typically have an aggressive disease course compared with other inherited cardiomyopathies, akin to reports of heart disease in his mother and other maternal relatives.  He reports having tachycardia in April and was hospitalized. He later had an ICD implanted and tells me that he has some soreness from the surgery and that he still has some mild dyspnea when he goes on long walks.  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 He tells me that his siblings live in [place] and [place] and are very motivated to undergo genetic testing.  I explained to him that it would be prudent to test his symptomatic mother and other affected maternal relatives who live in [place] for the familial LMNA variant. If they test positive, then the variant can be reclassified as a likely pathogenic variant. He understands this and will inform his mother, other maternal relatives and siblings about genetic testing for the familial LMNA variant.  Sidney Ace, Ph.D, [facility] Clinical Molecular Geneticist

## 2022-04-07 ENCOUNTER — Ambulatory Visit (HOSPITAL_COMMUNITY)
Admission: RE | Admit: 2022-04-07 | Discharge: 2022-04-07 | Disposition: A | Discharge status: Home / Self Care | Payer: 59 | Source: Ambulatory Visit | Attending: Internal Medicine | Admitting: Internal Medicine

## 2022-04-07 DIAGNOSIS — I5020 Unspecified systolic (congestive) heart failure: Secondary | ICD-10-CM | POA: Insufficient documentation

## 2022-04-07 LAB — BASIC METABOLIC PANEL
Anion gap: 7 (ref 5–15)
BUN: 26 mg/dL — ABNORMAL HIGH (ref 6–20)
CO2: 28 mmol/L (ref 22–32)
Calcium: 8.6 mg/dL — ABNORMAL LOW (ref 8.9–10.3)
Chloride: 103 mmol/L (ref 98–111)
Creatinine, Ser: 2.1 mg/dL — ABNORMAL HIGH (ref 0.61–1.24)
GFR, Estimated: 41 mL/min — ABNORMAL LOW (ref >60)
Glucose, Bld: 88 mg/dL (ref 70–99)
Potassium: 4.2 mmol/L (ref 3.5–5.1)
Sodium: 138 mmol/L (ref 135–145)

## 2022-04-08 LAB — T4: T4, Total: 5 ug/dL (ref 4.5–12.0)

## 2022-04-12 ENCOUNTER — Telehealth (HOSPITAL_COMMUNITY): Payer: Self-pay

## 2022-04-12 DIAGNOSIS — I5020 Unspecified systolic (congestive) heart failure: Secondary | ICD-10-CM

## 2022-04-12 MED ORDER — TORSEMIDE 20 MG PO TABS: 40.0000 mg | ORAL_TABLET | Freq: Every day | ORAL | 2 refills | Status: DC

## 2022-04-12 NOTE — Telephone Encounter (Signed)
-----   Message from Andrey Farmer, [place] sent at 04/07/2022  4:41 PM EDT ----- Scr worse. Do not take anymore metolazone. Decrease Torsemide from 60 mg to 40 mg daily. Can take extra 20 mg Torsemide as needed. BMET 2 weeks.

## 2022-04-12 NOTE — Telephone Encounter (Signed)
Patient advised and verbalized understanding,lab appointment scheduled,lab orders entered. Med list updated to reflect changes.   Orders Placed This Encounter  Procedures   Basic metabolic panel    Standing Status:   Future    Standing Expiration Date:   04/13/2023    Order Specific Question:   Release to patient    Answer:   Immediate   Meds ordered this encounter  Medications   torsemide (DEMADEX) 20 MG tablet    Sig: Take 2 tablets (40 mg total) by mouth daily. Can take an additional tablet as needed    Dispense:  90 tablet    Refill:  2

## 2022-04-20 ENCOUNTER — Other Ambulatory Visit: Payer: Self-pay | Admitting: Cardiology

## 2022-04-25 ENCOUNTER — Encounter (HOSPITAL_COMMUNITY): Payer: Self-pay

## 2022-04-26 ENCOUNTER — Telehealth (HOSPITAL_COMMUNITY): Payer: Self-pay | Admitting: Cardiology

## 2022-04-26 ENCOUNTER — Ambulatory Visit (HOSPITAL_COMMUNITY)
Admission: RE | Admit: 2022-04-26 | Discharge: 2022-04-26 | Disposition: A | Discharge status: Home / Self Care | Payer: 59 | Source: Ambulatory Visit | Attending: Internal Medicine | Admitting: Internal Medicine

## 2022-04-26 DIAGNOSIS — I5020 Unspecified systolic (congestive) heart failure: Secondary | ICD-10-CM | POA: Insufficient documentation

## 2022-04-26 LAB — BASIC METABOLIC PANEL
Anion gap: 10 (ref 5–15)
BUN: 20 mg/dL (ref 6–20)
CO2: 27 mmol/L (ref 22–32)
Calcium: 8.5 mg/dL — ABNORMAL LOW (ref 8.9–10.3)
Chloride: 101 mmol/L (ref 98–111)
Creatinine, Ser: 1.89 mg/dL — ABNORMAL HIGH (ref 0.61–1.24)
GFR, Estimated: 46 mL/min — ABNORMAL LOW (ref >60)
Glucose, Bld: 103 mg/dL — ABNORMAL HIGH (ref 70–99)
Potassium: 4.1 mmol/L (ref 3.5–5.1)
Sodium: 138 mmol/L (ref 135–145)

## 2022-04-26 NOTE — Telephone Encounter (Signed)
-----   Message from Joette Catching, [place] sent at 04/26/2022  5:09 PM EDT ----- Scr better but still a little above baseline. Would keep torsemide at 40 mg daily. Can take extra 20 mg as needed. Keep fluid intake to 48 oz daily  Please see if wound care would accept referral for application of unna boots at his request. Has no wounds but frequently gets LE edema.

## 2022-04-26 NOTE — Telephone Encounter (Signed)
Patient called.  Patient aware.  

## 2022-05-05 ENCOUNTER — Encounter (HOSPITAL_COMMUNITY): Payer: Self-pay | Admitting: Internal Medicine

## 2022-05-09 DIAGNOSIS — R112 Nausea with vomiting, unspecified: Secondary | ICD-10-CM | POA: Diagnosis not present

## 2022-05-09 DIAGNOSIS — K259 Gastric ulcer, unspecified as acute or chronic, without hemorrhage or perforation: Secondary | ICD-10-CM | POA: Diagnosis not present

## 2022-05-09 DIAGNOSIS — I509 Heart failure, unspecified: Secondary | ICD-10-CM | POA: Diagnosis not present

## 2022-05-09 DIAGNOSIS — K7460 Unspecified cirrhosis of liver: Secondary | ICD-10-CM | POA: Diagnosis not present

## 2022-05-18 DIAGNOSIS — K828 Other specified diseases of gallbladder: Secondary | ICD-10-CM | POA: Diagnosis not present

## 2022-05-18 DIAGNOSIS — K7460 Unspecified cirrhosis of liver: Secondary | ICD-10-CM | POA: Diagnosis not present

## 2022-05-18 DIAGNOSIS — R112 Nausea with vomiting, unspecified: Secondary | ICD-10-CM | POA: Diagnosis not present

## 2022-05-18 DIAGNOSIS — K7689 Other specified diseases of liver: Secondary | ICD-10-CM | POA: Diagnosis not present

## 2022-05-20 ENCOUNTER — Other Ambulatory Visit: Payer: Self-pay | Admitting: Student

## 2022-05-24 ENCOUNTER — Other Ambulatory Visit: Payer: Self-pay | Admitting: Gastroenterology

## 2022-05-24 DIAGNOSIS — R945 Abnormal results of liver function studies: Secondary | ICD-10-CM | POA: Diagnosis not present

## 2022-05-24 DIAGNOSIS — I509 Heart failure, unspecified: Secondary | ICD-10-CM | POA: Diagnosis not present

## 2022-05-24 DIAGNOSIS — R7989 Other specified abnormal findings of blood chemistry: Secondary | ICD-10-CM | POA: Diagnosis not present

## 2022-05-24 DIAGNOSIS — R112 Nausea with vomiting, unspecified: Secondary | ICD-10-CM | POA: Diagnosis not present

## 2022-05-24 DIAGNOSIS — I4891 Unspecified atrial fibrillation: Secondary | ICD-10-CM | POA: Diagnosis not present

## 2022-05-24 DIAGNOSIS — K7460 Unspecified cirrhosis of liver: Secondary | ICD-10-CM | POA: Diagnosis not present

## 2022-05-24 DIAGNOSIS — K259 Gastric ulcer, unspecified as acute or chronic, without hemorrhage or perforation: Secondary | ICD-10-CM | POA: Diagnosis not present

## 2022-05-25 ENCOUNTER — Ambulatory Visit (INDEPENDENT_AMBULATORY_CARE_PROVIDER_SITE_OTHER): Payer: 59

## 2022-05-25 DIAGNOSIS — I428 Other cardiomyopathies: Secondary | ICD-10-CM

## 2022-05-25 DIAGNOSIS — Z95810 Presence of automatic (implantable) cardiac defibrillator: Secondary | ICD-10-CM | POA: Diagnosis not present

## 2022-05-26 ENCOUNTER — Encounter: Payer: Self-pay | Admitting: Internal Medicine

## 2022-05-26 ENCOUNTER — Ambulatory Visit (INDEPENDENT_AMBULATORY_CARE_PROVIDER_SITE_OTHER): Payer: 59 | Admitting: Internal Medicine

## 2022-05-26 VITALS — BP 106/72 | HR 65 | Ht 68.0 in | Wt 252.0 lb

## 2022-05-26 DIAGNOSIS — I5042 Chronic combined systolic (congestive) and diastolic (congestive) heart failure: Secondary | ICD-10-CM

## 2022-05-26 DIAGNOSIS — Z95810 Presence of automatic (implantable) cardiac defibrillator: Secondary | ICD-10-CM | POA: Diagnosis not present

## 2022-05-26 DIAGNOSIS — I472 Ventricular tachycardia, unspecified: Secondary | ICD-10-CM | POA: Diagnosis not present

## 2022-05-26 DIAGNOSIS — R55 Syncope and collapse: Secondary | ICD-10-CM | POA: Diagnosis not present

## 2022-05-26 LAB — CUP PACEART REMOTE DEVICE CHECK
Battery Remaining Longevity: 174 mo
Battery Remaining Percentage: 100 %
Brady Statistic RV Percent Paced: 0 %
Date Time Interrogation Session: 20230712045300
HighPow Impedance: 69 Ohm
Implantable Lead Implant Date: 20230411
Implantable Lead Location: 753860
Implantable Lead Model: 138
Implantable Lead Serial Number: 305338
Implantable Pulse Generator Implant Date: 20230411
Lead Channel Impedance Value: 450 Ohm
Lead Channel Pacing Threshold Amplitude: 2.1 V
Lead Channel Pacing Threshold Pulse Width: 0.4 ms
Lead Channel Setting Pacing Amplitude: 3.5 V
Lead Channel Setting Pacing Pulse Width: 0.4 ms
Lead Channel Setting Sensing Sensitivity: 0.6 mV
Pulse Gen Serial Number: 216862

## 2022-05-26 MED ORDER — TORSEMIDE 20 MG PO TABS: ORAL_TABLET | ORAL | 3 refills | Status: DC

## 2022-05-26 NOTE — Progress Notes (Signed)
HPI Bradley Powell returns today for follow-up.  He is a very pleasant 41 year old man with a mixed chronic combined systolic and diastolic heart failure, hypertension, and obesity.  The patient was admitted to the hospital with syncope several months ago and underwent ICD implantation.  He has a known infiltrative cardiomyopathy and on presentation presented with a wide-complex tachycardia consistent with ventricular tachycardia.  The patient has done well except for some worsening renal insufficency. He has ongoing peripheral edema. He is weight himself daily and thinks that his weight is down a few pounds with his dry weight today 247 lbs at home. No ICD therapies.  Allergies  Allergen Reactions   Entresto [Sacubitril-Valsartan] Nausea And Vomiting and Other (See Comments)    Lightheaded, fainting     Current Outpatient Medications  Medication Sig Dispense Refill   allopurinol (ZYLOPRIM) 100 MG tablet Take 2 tablets (200 mg total) by mouth daily. Please contact PCP for further refills 60 tablet 0   amiodarone (PACERONE) 200 MG tablet TAKE 1 TABLET BY MOUTH TWICE A DAY 180 tablet 3   ELIQUIS 5 MG TABS tablet TAKE 1 TABLET BY MOUTH TWICE A DAY 180 tablet 3   empagliflozin (JARDIANCE) 10 MG TABS tablet TAKE 1 TABLET BY MOUTH EVERY DAY BEFORE BREAKFAST. 30 tablet 11   esomeprazole (NEXIUM) 40 MG capsule Take 40 mg by mouth 2 (two) times daily.     KLOR-CON M20 20 MEQ tablet TAKE 2 TABLETS BY MOUTH 2 TIMES DAILY. 224 tablet 2   metolazone (ZAROXOLYN) 2.5 MG tablet Take as directed by the HF Clinic 10 tablet 0   mexiletine (MEXITIL) 150 MG capsule Take 1 capsule (150 mg total) by mouth every 12 (twelve) hours. (Patient taking differently: Take 150 mg by mouth every 12 (twelve) hours. Patient takes 1 tablet at 6 am and at 10-11 pm at night) 60 capsule 5   MITIGARE 0.6 MG CAPS Take 0.6 mg by mouth daily as needed (gout flare). 30 capsule 5   spironolactone (ALDACTONE) 25 MG tablet TAKE 1  TABLET BY MOUTH EVERYDAY AT BEDTIME 90 tablet 3   sucralfate (CARAFATE) 1 g tablet Take 1 g by mouth daily.      torsemide (DEMADEX) 20 MG tablet Take one tablet by mouth daily IF weight greater than 252 pounds take an additional tablet 140 tablet 3   No current facility-administered medications for this visit.     Past Medical History:  Diagnosis Date   Atrial flutter (HCC)    a. s/p ablation 03/2018.   Chronic combined systolic and diastolic CHF (congestive heart failure) (HCC)    History of esophagogastroduodenoscopy (EGD)    Morbid obesity (HCC)    NICM (nonischemic cardiomyopathy) (HCC)    PVC's (premature ventricular contractions)    Sleep apnea     ROS:   All systems reviewed and negative except as noted in the HPI.   Past Surgical History:  Procedure Laterality Date   A-FLUTTER ABLATION N/A 04/06/2018   Procedure: A-FLUTTER ABLATION;  Surgeon: Bradley Maw, MD;  Location: [facility] INVASIVE CV LAB;  Service: Cardiovascular;  Laterality: N/A;   ESOPHAGEAL BRUSHING  06/19/2020   Procedure: ESOPHAGEAL BRUSHING;  Surgeon: Bradley Rakes, MD;  Location: [facility] ENDOSCOPY;  Service: Endoscopy;;   ESOPHAGOGASTRODUODENOSCOPY (EGD) WITH PROPOFOL Left 06/19/2020   Procedure: ESOPHAGOGASTRODUODENOSCOPY (EGD) WITH PROPOFOL;  Surgeon: Bradley Rakes, MD;  Location: [facility] ENDOSCOPY;  Service: Endoscopy;  Laterality: Left;   ICD IMPLANT N/A 02/22/2022   Procedure: ICD  IMPLANT;  Surgeon: Bradley Maw, MD;  Location: [facility] INVASIVE CV LAB;  Service: Cardiovascular;  Laterality: N/A;   NO PAST SURGERIES     RIGHT HEART CATH N/A 12/20/2019   Procedure: RIGHT HEART CATH;  Surgeon: Bradley Patty, MD;  Location: [facility] INVASIVE CV LAB;  Service: Cardiovascular;  Laterality: N/A;   RIGHT/LEFT HEART CATH AND CORONARY ANGIOGRAPHY N/A 03/01/2017   Procedure: Right/Left Heart Cath and Coronary Angiography;  Surgeon: Bradley Patty, MD;  Location: [facility] INVASIVE [facility] LAB;  Service: Cardiovascular;  Laterality:  N/A;     Family History  Problem Relation Age of Onset   CVA Mother        pacemaker   Multiple sclerosis Mother    Seizures Mother    Hypertension Father    Gout Father      Social History   Socioeconomic History   Marital status: Single    Spouse name: Not on file   Number of children: Not on file   Years of education: Not on file   Highest education level: Not on file  Occupational History   Not on file  Tobacco Use   Smoking status: Never   Smokeless tobacco: Never  Vaping Use   Vaping Use: Never used  Substance and Sexual Activity   Alcohol use: Yes    Comment: rarely   Drug use: No   Sexual activity: Not Currently    Partners: Female  Other Topics Concern   Not on file  Social History Narrative   Not on file   Social Determinants of Health   Financial Resource Strain: Not on file  Food Insecurity: Not on file  Transportation Needs: Not on file  Physical Activity: Not on file  Stress: Not on file  Social Connections: Not on file  Intimate Partner Violence: Not on file     BP 106/72   Pulse 65   Ht 5\' 8"  (1.727 m)   Wt 252 lb (114.3 kg)   SpO2 96%   BMI 38.32 kg/m   Physical Exam:  Well appearing NAD HEENT: Unremarkable Neck:  No JVD, no thyromegally Lymphatics:  No adenopathy Back:  No CVA tenderness Lungs:  Clear with no wheezes CV:  Regular rate rhythm, no murmurs, no rubs, no clicks Abd:  soft, positive bowel sounds, no organomegally, no rebound, no guarding Ext:  2 plus pulses, 1+ edema, no cyanosis, no clubbing Skin:  No rashes no nodules Neuro:  CN II through XII intact, motor grossly intact  DEVICE  Normal device function.  See PaceArt for details.   Assess/Plan:  Chronic systolic heart failure - his weight is down a few lbs. Because of his kidney dysfunction, I have asked him to reduce his dose of torsemide to a single 20 mg tab but once his weight at home goes over 252, he is instructed to go back to 40 mg daily.  Obesity -  his weight is down a few lbs. He is encouraged to lose more. I encouraged him to avoid sugar sweetened beverages. ICD - his device is working normally. Heart logic is 9.  VT - he will continue amiodarone 200 mg daily along with mexitil.   [employer] Sadiyah Kangas,MD

## 2022-05-26 NOTE — Patient Instructions (Addendum)
Medication Instructions:  Your physician has recommended you make the following change in your medication:   Torsemide 20 mg-  Take one tablet by mouth daily.    IF WEIGHT IS GREATER THAN 252 pounds, take an additional tablet.    Lab Work: None ordered.  If you have labs (blood work) drawn today and your tests are completely normal, you will receive your results only by: MyChart Message (if you have MyChart) OR A paper copy in the mail If you have any lab test that is abnormal or we need to change your treatment, we will call you to review the results.  Testing/Procedures: None ordered.  Follow-Up: At [facility], you and your health needs are our priority.  As part of our continuing mission to provide you with exceptional heart care, we have created designated Provider Care Teams.  These Care Teams include your primary Cardiologist (physician) and Advanced Practice Providers (APPs -  Physician Assistants and Nurse Practitioners) who all work together to provide you with the care you need, when you need it.  We recommend signing up for the patient portal called "MyChart".  Sign up information is provided on this After Visit Summary.  MyChart is used to connect with patients for Virtual Visits (Telemedicine).  Patients are able to view lab/test results, encounter notes, upcoming appointments, etc.  Non-urgent messages can be sent to your provider as well.   To learn more about what you can do with MyChart, go to [email or web address].    Your next appointment:   1 year(s)  The format for your next appointment:   In Person  Provider:   Lewayne Bunting, MD{or one of the following Advanced Practice Providers on your designated Care Team:   Francis Dowse, [place] Casimiro Needle "[name]" Lanna Poche, [place]  Remote monitoring is used to monitor your ICD from home. This monitoring reduces the number of office visits required to check your device to one time per year. It allows [place] to keep an eye on the  functioning of your device to ensure it is working properly. You are scheduled for a device check from home on 08/24/22. You may send your transmission at any time that day. If you have a wireless device, the transmission will be sent automatically. After your physician reviews your transmission, you will receive a postcard with your next transmission date.  Important Information About Sugar

## 2022-06-02 DIAGNOSIS — M109 Gout, unspecified: Secondary | ICD-10-CM | POA: Diagnosis not present

## 2022-06-02 DIAGNOSIS — I4891 Unspecified atrial fibrillation: Secondary | ICD-10-CM | POA: Diagnosis not present

## 2022-06-02 DIAGNOSIS — G4733 Obstructive sleep apnea (adult) (pediatric): Secondary | ICD-10-CM | POA: Diagnosis not present

## 2022-06-02 DIAGNOSIS — D6869 Other thrombophilia: Secondary | ICD-10-CM | POA: Diagnosis not present

## 2022-06-02 DIAGNOSIS — Z6837 Body mass index (BMI) 37.0-37.9, adult: Secondary | ICD-10-CM | POA: Diagnosis not present

## 2022-06-02 DIAGNOSIS — K219 Gastro-esophageal reflux disease without esophagitis: Secondary | ICD-10-CM | POA: Diagnosis not present

## 2022-06-02 DIAGNOSIS — E669 Obesity, unspecified: Secondary | ICD-10-CM | POA: Diagnosis not present

## 2022-06-02 DIAGNOSIS — I509 Heart failure, unspecified: Secondary | ICD-10-CM | POA: Diagnosis not present

## 2022-06-02 DIAGNOSIS — E876 Hypokalemia: Secondary | ICD-10-CM | POA: Diagnosis not present

## 2022-06-02 DIAGNOSIS — Z7984 Long term (current) use of oral hypoglycemic drugs: Secondary | ICD-10-CM | POA: Diagnosis not present

## 2022-06-02 DIAGNOSIS — Z7901 Long term (current) use of anticoagulants: Secondary | ICD-10-CM | POA: Diagnosis not present

## 2022-06-02 DIAGNOSIS — I110 Hypertensive heart disease with heart failure: Secondary | ICD-10-CM | POA: Diagnosis not present

## 2022-06-03 ENCOUNTER — Encounter (HOSPITAL_COMMUNITY): Payer: Self-pay | Admitting: Internal Medicine

## 2022-06-03 ENCOUNTER — Ambulatory Visit (HOSPITAL_COMMUNITY)
Admission: RE | Admit: 2022-06-03 | Discharge: 2022-06-03 | Disposition: A | Discharge status: Home / Self Care | Payer: 59 | Source: Ambulatory Visit | Attending: Internal Medicine | Admitting: Internal Medicine

## 2022-06-03 VITALS — BP 118/76 | HR 65 | Wt 255.8 lb

## 2022-06-03 DIAGNOSIS — I3139 Other pericardial effusion (noninflammatory): Secondary | ICD-10-CM | POA: Insufficient documentation

## 2022-06-03 DIAGNOSIS — Z823 Family history of stroke: Secondary | ICD-10-CM | POA: Insufficient documentation

## 2022-06-03 DIAGNOSIS — I472 Ventricular tachycardia, unspecified: Secondary | ICD-10-CM | POA: Insufficient documentation

## 2022-06-03 DIAGNOSIS — E669 Obesity, unspecified: Secondary | ICD-10-CM | POA: Diagnosis not present

## 2022-06-03 DIAGNOSIS — N1831 Chronic kidney disease, stage 3a: Secondary | ICD-10-CM

## 2022-06-03 DIAGNOSIS — I4892 Unspecified atrial flutter: Secondary | ICD-10-CM

## 2022-06-03 DIAGNOSIS — I081 Rheumatic disorders of both mitral and tricuspid valves: Secondary | ICD-10-CM | POA: Diagnosis not present

## 2022-06-03 DIAGNOSIS — I482 Chronic atrial fibrillation, unspecified: Secondary | ICD-10-CM | POA: Insufficient documentation

## 2022-06-03 DIAGNOSIS — Z7984 Long term (current) use of oral hypoglycemic drugs: Secondary | ICD-10-CM | POA: Insufficient documentation

## 2022-06-03 DIAGNOSIS — I5023 Acute on chronic systolic (congestive) heart failure: Secondary | ICD-10-CM

## 2022-06-03 DIAGNOSIS — Z6838 Body mass index (BMI) 38.0-38.9, adult: Secondary | ICD-10-CM | POA: Insufficient documentation

## 2022-06-03 DIAGNOSIS — I5022 Chronic systolic (congestive) heart failure: Secondary | ICD-10-CM | POA: Diagnosis not present

## 2022-06-03 DIAGNOSIS — Z7901 Long term (current) use of anticoagulants: Secondary | ICD-10-CM | POA: Diagnosis not present

## 2022-06-03 DIAGNOSIS — Z79899 Other long term (current) drug therapy: Secondary | ICD-10-CM | POA: Insufficient documentation

## 2022-06-03 DIAGNOSIS — I130 Hypertensive heart and chronic kidney disease with heart failure and stage 1 through stage 4 chronic kidney disease, or unspecified chronic kidney disease: Secondary | ICD-10-CM | POA: Insufficient documentation

## 2022-06-03 DIAGNOSIS — G4733 Obstructive sleep apnea (adult) (pediatric): Secondary | ICD-10-CM | POA: Insufficient documentation

## 2022-06-03 DIAGNOSIS — M109 Gout, unspecified: Secondary | ICD-10-CM | POA: Diagnosis not present

## 2022-06-03 DIAGNOSIS — N1832 Chronic kidney disease, stage 3b: Secondary | ICD-10-CM | POA: Insufficient documentation

## 2022-06-03 DIAGNOSIS — Z8249 Family history of ischemic heart disease and other diseases of the circulatory system: Secondary | ICD-10-CM | POA: Diagnosis not present

## 2022-06-03 LAB — BASIC METABOLIC PANEL
Anion gap: 12 (ref 5–15)
BUN: 12 mg/dL (ref 6–20)
CO2: 27 mmol/L (ref 22–32)
Calcium: 8.9 mg/dL (ref 8.9–10.3)
Chloride: 100 mmol/L (ref 98–111)
Creatinine, Ser: 1.55 mg/dL — ABNORMAL HIGH (ref 0.61–1.24)
GFR, Estimated: 58 mL/min — ABNORMAL LOW (ref >60)
Glucose, Bld: 87 mg/dL (ref 70–99)
Potassium: 3.2 mmol/L — ABNORMAL LOW (ref 3.5–5.1)
Sodium: 139 mmol/L (ref 135–145)

## 2022-06-03 LAB — BRAIN NATRIURETIC PEPTIDE: B Natriuretic Peptide: 157 pg/mL — ABNORMAL HIGH (ref 0.0–100.0)

## 2022-06-03 NOTE — Progress Notes (Signed)
Provided patient education on Furoscix using demo kits and Furoscix video, QR code provided on AVS for further viewing. Furoscix order form completed and signed by Dr [name]. Order form, ins info, & OV note all faxed into [employer].  Medication Samples have been provided to the patient.  Drug name: Furoscix       Strength: 80mg         Qty: 3  LOT , : [id number]  Exp.Date: 01/12/24, 03/31/23  Dosing instructions: use as directed  The patient has been instructed regarding the correct time, dose, and frequency of taking this medication, including desired effects and most common side effects.   Bradley Powell 2:59 PM 06/03/2022

## 2022-06-03 NOTE — Addendum Note (Signed)
Encounter addended by: Noralee Space, RN on: 06/03/2022 3:05 PM  Actions taken: Charge Capture section accepted, Clinical Note Signed

## 2022-06-03 NOTE — Progress Notes (Signed)
Advanced Heart Failure Clinic Note   PCP:  Seward Carol, MD  GI : Dr Camie Patience.  HF Cardiologist: Dr. Haroldine Laws  Chief Complaint: Heart Failure follow up   HPI: YAMEL GIFT is a 41 y.o. male with a history of combined systolic/diastolic heart failure,  obesity, HTN, and gout.   Admitted 4/13 through 03/07/17 with marked volume overload. EF 40-45%. Diuresed with IV lasix tid + metolazone. Had RHC/LHC as noted below. Overall diuresed 45 pounds. Discharge weight was 281 pounds.    He underwent atrial flutter ablation on 04/06/18. Subsequently had marked 1st AV block with progression to junctional rhythm. Saw Dr. Lovena Le who wanted to follow it. Suggested cutting back carvedilol.  Previously Bidil stopped due to hypotension.  Admitted  8/21 with intractable N/V of unclear etiology, hypotension and AKI. Creatinine peaked at 4.5. Back to 1.6 at d/c. Has been followed by GI.   cMRI 7/21 EF 45% suspect infiltrative CM. ECV 55%  Volume overloaded at clinic f/u 6/22, weight up 50 lbs. Misunderstood metolazone directions and took for 5 days. Weight down 65 lbs and required IVF in clinic.  Follow up 9/22, weight up and down. He adjusts torsemide as needed. Considering endomyocardial bx.   Admitted 4/23 with syncope and collapse, found to be in [facility] c/w VT. Developed bradycardia and required pacing after given IV cardizem. On arrival rate was in the 50s. Had massive volume overload on exam. Had recurrent sustained VT w/ rates 200-210, converted with IV amio and moved to ICU. Echo showed EF 35-40%, LV moderately down, RV moderately reduced. Diuresed over 50 pounds with IV lasix. Underwent BosSci ICD implant. IV amio switched to po (already on mexilitene for PVCs). 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 No driving x 6 months. Discharged home, weight 238 lbs (down 57 lbs).  Saw Geneticist, Dr. Broadus John and  undergoing genetic testing.   Here today for HF f/u. Had to stop diuretics  for a few days due to n/v. Subsequently restarted torsemide. Missed a couple days of torsemide over the weekend when [place] was in [place]. Now swelling again. On torsemide 40 daily and not getting fluid down. + LE edema. Breathing fine. No CP, orthopnea or PND. Working out. Swimming x 30 mins in lap pool.   Cardiac Studies - Echo (4/23): EF 35-40%, moderate LV dysfunction, RV moderately reduced, mild to moderate MR, moderate TR, posterior pericardial effusion  - cMRI (7/21): 1. Findings suggestive of infiltrative cardiomyopathy. Native T1 ECV 55%, with diffuse post contrast delayed myocardial enhancement and abnormal T1 myocardial nulling kinetics, consistent with a diagnosis of cardiac amyloidosis. 2.  Upper limit of normal biventricular chamber size. 3. Mildly reduced biventricular systolic function. LVEF 45%, RVEF 44%. 4. Cannot exclude possible anomalous pulmonary venous drainage in to the mid SVC. Seen on axial HASTE image series 3 image 4, but evaluation limited due to poor spatial resolution. Consider ECG gated CTA chest for further evaluation.  - Echo 2/21: EF 40-45%  - Echo 1/20: EF 35-40% - Echo 9/18: EF ~25-30%.  - Echo 3/19: EF 45-50%  - Holter 7/18: 6% PVCs - Sleep study (10/18): AHI 26/hr  - RHC (2/21): RA = 14 RV = 43/15 PA = 43/21 (31) PCW = 15 Fick cardiac output/index = 5.7/2.4 PVR = 2.8 WU Ao sat = 97% PA sat = 73%, 75% SVC sat = 67%   - R/LHC (4/18): AO = 109/86 (97) LV =  101/28 RA =  29 RV = 50/22  PA = 55/24 (38) PCW = 26 Fick cardiac output/index = 6.4/2.6 Thermal CO/CI = 4.9/2.0 PVR = 2.5 WU FA sat = 98% PA sat = 69%, 71% SVC sat = 82%, 82% IVC sat = 66%   Assessment: 1. Normal coronary arteries  2. Mild to moderate pulmonary HTN with normal PVR 3. Biventricular volume overload with R > L heart failure 4. Moderately decreased CO by thermodilution  5. 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 No intra-cardiac shunt  Past Medical History:  Diagnosis Date   Atrial flutter (HCC)    a. s/p ablation 03/2018.   Chronic combined systolic and diastolic CHF (congestive heart failure) (HCC)    History of esophagogastroduodenoscopy (EGD)    Morbid obesity (HCC)    NICM (nonischemic cardiomyopathy) (HCC)    PVC's (premature ventricular contractions)    Sleep apnea    Past Surgical History:  Procedure Laterality Date   A-FLUTTER ABLATION N/A 04/06/2018   Procedure: A-FLUTTER ABLATION;  Surgeon: Marinus Maw, MD;  Location: [facility] INVASIVE CV LAB;  Service: Cardiovascular;  Laterality: N/A;   ESOPHAGEAL BRUSHING  06/19/2020   Procedure: ESOPHAGEAL BRUSHING;  Surgeon: Charlott Rakes, MD;  Location: [facility] ENDOSCOPY;  Service: Endoscopy;;   ESOPHAGOGASTRODUODENOSCOPY (EGD) WITH PROPOFOL Left 06/19/2020   Procedure: ESOPHAGOGASTRODUODENOSCOPY (EGD) WITH PROPOFOL;  Surgeon: Charlott Rakes, MD;  Location: [facility] ENDOSCOPY;  Service: Endoscopy;  Laterality: Left;   ICD IMPLANT N/A 02/22/2022   Procedure: ICD IMPLANT;  Surgeon: Marinus Maw, MD;  Location: [facility] INVASIVE CV LAB;  Service: Cardiovascular;  Laterality: N/A;   NO PAST SURGERIES     RIGHT HEART CATH N/A 12/20/2019   Procedure: RIGHT HEART CATH;  Surgeon: Dolores Patty, MD;  Location: [facility] INVASIVE CV LAB;  Service: Cardiovascular;  Laterality: N/A;   RIGHT/LEFT HEART CATH AND CORONARY ANGIOGRAPHY N/A 03/01/2017   Procedure: Right/Left Heart Cath and Coronary Angiography;  Surgeon: Dolores Patty, MD;  Location: [facility] INVASIVE CV LAB;  Service: Cardiovascular;  Laterality: N/A;   Current Outpatient Medications  Medication Sig Dispense Refill   allopurinol (ZYLOPRIM) 100 MG tablet Take 2 tablets (200 mg total) by mouth daily. Please contact PCP for further refills 60 tablet 0   amiodarone (PACERONE) 200 MG tablet TAKE 1 TABLET BY MOUTH TWICE A DAY 180 tablet 3   ELIQUIS 5 MG TABS tablet TAKE 1 TABLET BY MOUTH  TWICE A DAY 180 tablet 3   empagliflozin (JARDIANCE) 10 MG TABS tablet TAKE 1 TABLET BY MOUTH EVERY DAY BEFORE BREAKFAST. 30 tablet 11   esomeprazole (NEXIUM) 40 MG capsule Take 40 mg by mouth 2 (two) times daily.     KLOR-CON M20 20 MEQ tablet TAKE 2 TABLETS BY MOUTH 2 TIMES DAILY. 224 tablet 2   mexiletine (MEXITIL) 150 MG capsule Take 1 capsule (150 mg total) by mouth every 12 (twelve) hours. (Patient taking differently: Take 150 mg by mouth every 12 (twelve) hours. Patient takes 1 tablet at 6 am and at 10-11 pm at night) 60 capsule 5   MITIGARE 0.6 MG CAPS Take 0.6 mg by mouth daily as needed (gout flare). 30 capsule 5   spironolactone (ALDACTONE) 25 MG tablet TAKE 1 TABLET BY MOUTH EVERYDAY AT BEDTIME 90 tablet 3   sucralfate (CARAFATE) 1 g tablet Take 1 g by mouth daily.      torsemide (DEMADEX) 20 MG tablet Take one tablet by mouth daily IF weight greater  than 252 pounds take an additional tablet 140 tablet 3   No current facility-administered medications for this encounter.    Allergies:   Entresto [sacubitril-valsartan]   Social History:  The patient  reports that he has never smoked. He has never used smokeless tobacco. He reports current alcohol use. He reports that he does not use drugs.   Family History:  The patient's family history includes CVA in his mother; Gout in his father; Hypertension in his father; Multiple sclerosis in his mother; Seizures in his mother.   ROS:  Please see the history of present illness.   All other systems are personally reviewed and negative.   BP 118/76   Pulse 65   Wt 116 kg (255 lb 12.8 oz)   SpO2 97%   BMI 38.89 kg/m   Recent Labs: 03/11/2022: Hemoglobin 15.6; Magnesium 2.0; Platelets 198 03/31/2022: ALT 38; B Natriuretic Peptide 213.0; TSH 10.491 04/26/2022: BUN 20; Creatinine, Ser 1.89; Potassium 4.1; Sodium 138  Personally reviewed   Wt Readings from Last 3 Encounters:  06/03/22 116 kg (255 lb 12.8 oz)  05/26/22 114.3 kg (252 lb)   03/31/22 118.6 kg (261 lb 6.4 oz)    Physical Exam:   General:  Well appearing. No resp difficulty HEENT: normal Neck: supple. JVP 9-10 Carotids 2+ bilat; no bruits. No lymphadenopathy or thryomegaly appreciated. Cor: PMI nondisplaced. irregular rate & rhythm. No rubs, gallops or murmurs. Lungs: clear Abdomen: obese soft, nontender, nondistended. No hepatosplenomegaly. No bruits or masses. Good bowel sounds. Extremities: no cyanosis, clubbing, rash, 2+ edema Neuro: alert & orientedx3, cranial nerves grossly intact. moves all 4 extremities w/o difficulty. Affect pleasant   ICD interrogated in clinic HL 19. Fluid up. No VT. Activity level 2.4 hr/day Personally reviewed  ASSESSMENT AND PLAN: 1. Syncope/VT - 04/23 > secondary to VT - s/p ICD 4/23. Has f/u with EP in July. - Continue mexiletine 150 mg bid   - Continue amiodarone 200 mg BID  - No driving 6 months.  - Continue Eliquis - No VT on device today.  - Followed by Dr. Ladona Ridgel   2. Chronic HFrEF - Echo (1/20):  EF 35-40% with mild RV dysfunction RVSP 40 mmHG - Echo (2/21): EF 40-45% Moderate RV dysfunction  - PYP (4/22). Ratio 1.26 read as equivocal.  SPEP negative - cMRI repeated (11/22): LVEF 43%, RVEF 44%, LGE concerning for sarcoid.  - cMRI (8/21): EF 45% + infiltrative process suggestive of possible amyloidosis - Echo (4/23): EF 35-40% Moderate RV dysfunction - Has seen geneticist, Dr. Jomarie Longs. Work up c/w LMNA. His mother will be undergoing genetic testing in the near future. - Stable NYHA II. Volume up on torsemide 40 daily. Not responding well to increasing doses of torsemide. Will start Furoscix.  - Continue spiro 25 mg daily  - Continue Jardiance 10 mg daily.  - Off b-blocker with bradycardia/heart block - Did not tolerate ARNi and ARB in the past d/t hypotension, n/v. Will need to try again in future once fluid status improved.  - No Bidil (hypotension in the past) - Labs today   3. CKD Stage IIIb - Scr runs  1.7-2.0 - BMET today.   4.Chronic AF/AFL - S/p AFL ablation 5/19 with Dr. Ladona Ridgel. - Continue Eliquis 5 mg bid. No bleeding issues. - Continue amiodarone 200 mg BID. Rate controlled. LFTs and TSH today.   5.  OSA - Continue CPAP.   6. PVCs/VT - Continue mexiletine + amiodarone. - Better suppressed.  - No ventricular arrhythmias  on device check 05/11 - Followed by EP   7. Obesity - Body mass index is 38.89 kg/m.  Glori Bickers, MD  2:44 PM

## 2022-06-03 NOTE — Addendum Note (Signed)
Encounter addended by: Noralee Space, RN on: 06/03/2022 4:07 PM  Actions taken: Clinical Note Signed

## 2022-06-03 NOTE — Progress Notes (Signed)
Spent 15-20 minutes educating pt on Furoscix, and HF management including na/fluid/weights. Pt verbalized understanding, was engaged and thankful for information as he has been feeling very discouraged  Meredith Staggers, RN, BSN, [facility] Specialty Coordinator Advanced Heart Failure Clinic

## 2022-06-03 NOTE — Patient Instructions (Addendum)
Labs done today, your results will be available in MyChart, we will contact you for abnormal readings.  Your provider has order Furoscix for you. This is an on-body infuser that gives you a dose of Furosemide.   It will be shipped to your home and we have provided samples for you as well  [employer] will call you to discuss before shipping so, PLEASE answer unknown calls  For questions regarding the device call [employer] at [phone]  Ensure you write down the time you start your infusion so that if there is a problem you will know how long the infusion lasted  Use Furoscix only AS DIRECTED by our office  Dosing Directions:   Day 1= Use Furoscix Kit  Day 2= Use Furoscix Kit  Day 3= Use Furoscix Kit   Your physician recommends that you schedule a follow-up appointment in: 2-3 months  Do the following things EVERYDAY: Weigh yourself in the morning before breakfast. Write it down and keep it in a log. Take your medicines as prescribed Eat low salt foods--Limit salt (sodium) to 2000 mg per day.  Stay as active as you can everyday Limit all fluids for the day to less than 2 liters  If you have any questions or concerns before your next appointment please send [place] a message through [place] or call our office at [phone].    TO LEAVE A MESSAGE FOR THE NURSE SELECT OPTION 2, PLEASE LEAVE A MESSAGE INCLUDING: YOUR NAME DATE OF BIRTH CALL BACK NUMBER REASON FOR CALL**this is important as we prioritize the call backs  YOU WILL RECEIVE A CALL BACK THE SAME DAY AS LONG AS YOU CALL BEFORE 4:00 PM  At the [facility], you and your health needs are our priority. As part of our continuing mission to provide you with exceptional heart care, we have created designated Provider Care Teams. These Care Teams include your primary Cardiologist (physician) and Advanced Practice Providers (APPs- Physician Assistants and Nurse Practitioners) who all work together  to provide you with the care you need, when you need it.   You may see any of the following providers on your designated Care Team at your next follow up: Dr Glori Bickers Dr Haynes Kerns, NP Lyda Jester, [place] [facility] [place], [place] Audry Riles, PharmD   Please be sure to bring in all your medications bottles to every appointment.

## 2022-06-06 ENCOUNTER — Telehealth (HOSPITAL_COMMUNITY): Payer: Self-pay | Admitting: Surgery

## 2022-06-06 ENCOUNTER — Telehealth (HOSPITAL_COMMUNITY): Payer: Self-pay | Admitting: Cardiology

## 2022-06-06 DIAGNOSIS — I5022 Chronic systolic (congestive) heart failure: Secondary | ICD-10-CM

## 2022-06-06 DIAGNOSIS — I5023 Acute on chronic systolic (congestive) heart failure: Secondary | ICD-10-CM

## 2022-06-06 MED ORDER — POTASSIUM CHLORIDE CRYS ER 20 MEQ PO TBCR: 20.0000 meq | EXTENDED_RELEASE_TABLET | Freq: Every day | ORAL | 0 refills | Status: DC

## 2022-06-06 NOTE — Telephone Encounter (Signed)
Patient called.  Patient aware.  Duplicate call see other telephone encounter

## 2022-06-06 NOTE — Telephone Encounter (Signed)
I called patient to review results and recommendations per provider.  I sent Potassium prescription to his pharmacy of choice and updated medlist in [facility].  I scheduled a lab appt on Wednesday to recheck labwork.  The patient does have questions regarding Furoscix upcoming doses as the company informs him they are mailing medication and he plans to be out of town after Wednesday and therefore likely will not receive shipment.

## 2022-06-06 NOTE — Telephone Encounter (Signed)
-----   Message from Dolores Patty, MD sent at 06/05/2022  6:30 PM EDT ----- Add potassium daily. Take 3 on the first day only then back to one daily. Repeat labs this Thursday as he got Furoscix over the weekend please.

## 2022-06-06 NOTE — Telephone Encounter (Signed)
-----   Message from Daniel R Bensimhon, MD sent at 06/05/2022  6:30 PM EDT ----- Add potassium 20meq daily. Take 3 on the first day only then back to one daily. Repeat labs this Thursday as he got Furoscix over the weekend please.  

## 2022-06-08 ENCOUNTER — Ambulatory Visit (HOSPITAL_COMMUNITY)
Admission: RE | Admit: 2022-06-08 | Discharge: 2022-06-08 | Disposition: A | Discharge status: Home / Self Care | Payer: 59 | Source: Ambulatory Visit | Attending: Internal Medicine | Admitting: Internal Medicine

## 2022-06-08 DIAGNOSIS — I5022 Chronic systolic (congestive) heart failure: Secondary | ICD-10-CM | POA: Insufficient documentation

## 2022-06-08 LAB — BASIC METABOLIC PANEL
Anion gap: 10 (ref 5–15)
BUN: 18 mg/dL (ref 6–20)
CO2: 26 mmol/L (ref 22–32)
Calcium: 8.9 mg/dL (ref 8.9–10.3)
Chloride: 101 mmol/L (ref 98–111)
Creatinine, Ser: 1.75 mg/dL — ABNORMAL HIGH (ref 0.61–1.24)
GFR, Estimated: 50 mL/min — ABNORMAL LOW (ref >60)
Glucose, Bld: 93 mg/dL (ref 70–99)
Potassium: 3.3 mmol/L — ABNORMAL LOW (ref 3.5–5.1)
Sodium: 137 mmol/L (ref 135–145)

## 2022-06-14 NOTE — Progress Notes (Signed)
Remote ICD transmission.   

## 2022-06-23 ENCOUNTER — Other Ambulatory Visit: Payer: Self-pay | Admitting: Cardiology

## 2022-06-24 ENCOUNTER — Ambulatory Visit
Admission: RE | Admit: 2022-06-24 | Discharge: 2022-06-24 | Disposition: A | Discharge status: Home / Self Care | Payer: 59 | Source: Ambulatory Visit | Attending: Gastroenterology | Admitting: Gastroenterology

## 2022-06-24 DIAGNOSIS — R112 Nausea with vomiting, unspecified: Secondary | ICD-10-CM | POA: Diagnosis not present

## 2022-07-01 ENCOUNTER — Other Ambulatory Visit: Payer: Self-pay | Admitting: Cardiology

## 2022-07-19 DIAGNOSIS — Z20822 Contact with and (suspected) exposure to covid-19: Secondary | ICD-10-CM | POA: Diagnosis not present

## 2022-07-19 DIAGNOSIS — Z1152 Encounter for screening for COVID-19: Secondary | ICD-10-CM | POA: Diagnosis not present

## 2022-07-22 ENCOUNTER — Other Ambulatory Visit (HOSPITAL_COMMUNITY): Payer: Self-pay | Admitting: *Deleted

## 2022-07-22 ENCOUNTER — Other Ambulatory Visit (HOSPITAL_COMMUNITY): Payer: Self-pay | Admitting: Internal Medicine

## 2022-07-22 MED ORDER — APIXABAN 5 MG PO TABS: 5.0000 mg | ORAL_TABLET | Freq: Two times a day (BID) | ORAL | 11 refills | Status: DC

## 2022-08-10 ENCOUNTER — Ambulatory Visit (HOSPITAL_COMMUNITY)
Admission: RE | Admit: 2022-08-10 | Discharge: 2022-08-10 | Disposition: A | Discharge status: Home / Self Care | Payer: 59 | Source: Ambulatory Visit | Attending: Family Medicine | Admitting: Family Medicine

## 2022-08-10 ENCOUNTER — Encounter (HOSPITAL_COMMUNITY): Payer: Self-pay

## 2022-08-10 VITALS — BP 110/68 | HR 61 | Wt 273.0 lb

## 2022-08-10 DIAGNOSIS — I130 Hypertensive heart and chronic kidney disease with heart failure and stage 1 through stage 4 chronic kidney disease, or unspecified chronic kidney disease: Secondary | ICD-10-CM | POA: Insufficient documentation

## 2022-08-10 DIAGNOSIS — I5042 Chronic combined systolic (congestive) and diastolic (congestive) heart failure: Secondary | ICD-10-CM | POA: Diagnosis not present

## 2022-08-10 DIAGNOSIS — Z6841 Body Mass Index (BMI) 40.0 and over, adult: Secondary | ICD-10-CM | POA: Insufficient documentation

## 2022-08-10 DIAGNOSIS — Z7901 Long term (current) use of anticoagulants: Secondary | ICD-10-CM | POA: Diagnosis not present

## 2022-08-10 DIAGNOSIS — Z95810 Presence of automatic (implantable) cardiac defibrillator: Secondary | ICD-10-CM | POA: Diagnosis not present

## 2022-08-10 DIAGNOSIS — Z7984 Long term (current) use of oral hypoglycemic drugs: Secondary | ICD-10-CM | POA: Diagnosis not present

## 2022-08-10 DIAGNOSIS — I472 Ventricular tachycardia, unspecified: Secondary | ICD-10-CM | POA: Diagnosis not present

## 2022-08-10 DIAGNOSIS — I5023 Acute on chronic systolic (congestive) heart failure: Secondary | ICD-10-CM

## 2022-08-10 DIAGNOSIS — M109 Gout, unspecified: Secondary | ICD-10-CM | POA: Insufficient documentation

## 2022-08-10 DIAGNOSIS — R55 Syncope and collapse: Secondary | ICD-10-CM | POA: Diagnosis not present

## 2022-08-10 DIAGNOSIS — R946 Abnormal results of thyroid function studies: Secondary | ICD-10-CM | POA: Insufficient documentation

## 2022-08-10 DIAGNOSIS — Z79899 Other long term (current) drug therapy: Secondary | ICD-10-CM | POA: Insufficient documentation

## 2022-08-10 DIAGNOSIS — N1832 Chronic kidney disease, stage 3b: Secondary | ICD-10-CM | POA: Insufficient documentation

## 2022-08-10 DIAGNOSIS — I959 Hypotension, unspecified: Secondary | ICD-10-CM | POA: Diagnosis not present

## 2022-08-10 DIAGNOSIS — E669 Obesity, unspecified: Secondary | ICD-10-CM | POA: Insufficient documentation

## 2022-08-10 DIAGNOSIS — G4733 Obstructive sleep apnea (adult) (pediatric): Secondary | ICD-10-CM | POA: Diagnosis not present

## 2022-08-10 DIAGNOSIS — N183 Chronic kidney disease, stage 3 unspecified: Secondary | ICD-10-CM

## 2022-08-10 DIAGNOSIS — I4892 Unspecified atrial flutter: Secondary | ICD-10-CM | POA: Diagnosis not present

## 2022-08-10 DIAGNOSIS — I482 Chronic atrial fibrillation, unspecified: Secondary | ICD-10-CM | POA: Diagnosis not present

## 2022-08-10 DIAGNOSIS — I493 Ventricular premature depolarization: Secondary | ICD-10-CM

## 2022-08-10 LAB — BASIC METABOLIC PANEL
Anion gap: 8 (ref 5–15)
BUN: 20 mg/dL (ref 6–20)
CO2: 30 mmol/L (ref 22–32)
Calcium: 8.4 mg/dL — ABNORMAL LOW (ref 8.9–10.3)
Chloride: 102 mmol/L (ref 98–111)
Creatinine, Ser: 1.73 mg/dL — ABNORMAL HIGH (ref 0.61–1.24)
GFR, Estimated: 51 mL/min — ABNORMAL LOW (ref >60)
Glucose, Bld: 96 mg/dL (ref 70–99)
Potassium: 4.1 mmol/L (ref 3.5–5.1)
Sodium: 140 mmol/L (ref 135–145)

## 2022-08-10 LAB — BRAIN NATRIURETIC PEPTIDE: B Natriuretic Peptide: 211.5 pg/mL — ABNORMAL HIGH (ref 0.0–100.0)

## 2022-08-10 NOTE — Patient Instructions (Addendum)
Labs done today. We will contact you only if your labs are abnormal.  RESTART Torsemide 40mg  (2 tablets) by mouth daily after the 3 days of Furiscix.   Use Furoscix for 3 days with an extra 22meq (2 tablets) of Potassium.  No other medication changes were made. Please continue all current medications as prescribed.  Please wear your compression hose daily, place them on as soon as you get up in the morning and remove before you go to bed at night.  Your physician recommends that you schedule a follow-up appointment in: 1 week with our NP/PA Clinic here in our office and in 3 months with Dr. Haroldine Laws. Please contact our office in November to schedule a December appointment.   If you have any questions or concerns before your next appointment please send [place] a message through [place] or call our office at [phone].    TO LEAVE A MESSAGE FOR THE NURSE SELECT OPTION 2, PLEASE LEAVE A MESSAGE INCLUDING: YOUR NAME DATE OF BIRTH CALL BACK NUMBER REASON FOR CALL**this is important as we prioritize the call backs  YOU WILL RECEIVE A CALL BACK THE SAME DAY AS LONG AS YOU CALL BEFORE 4:00 PM   Do the following things EVERYDAY: Weigh yourself in the morning before breakfast. Write it down and keep it in a log. Take your medicines as prescribed Eat low salt foods--Limit salt (sodium) to 2000 mg per day.  Stay as active as you can everyday Limit all fluids for the day to less than 2 liters   At the [facility], you and your health needs are our priority. As part of our continuing mission to provide you with exceptional heart care, we have created designated Provider Care Teams. These Care Teams include your primary Cardiologist (physician) and Advanced Practice Providers (APPs- Physician Assistants and Nurse Practitioners) who all work together to provide you with the care you need, when you need it.   You may see any of the following providers on your designated Care Team at  your next follow up: Dr Glori Bickers Dr Haynes Kerns, NP Lyda Jester, [place] Audry Riles, PharmD   Please be sure to bring in all your medications bottles to every appointment.

## 2022-08-10 NOTE — Progress Notes (Signed)
Advanced Heart Failure Clinic Note   PCP:  Seward Carol, MD  GI : Dr Camie Patience.  HF Cardiologist: Dr. Haroldine Laws  Chief Complaint: Heart Failure follow up   HPI: Bradley Powell is a 41 y.o. male with a history of combined systolic/diastolic heart failure,  obesity, HTN, and gout.   Admitted 4/13 through 03/07/17 with marked volume overload. EF 40-45%. Diuresed with IV lasix tid + metolazone. Had RHC/LHC as noted below. Overall diuresed 45 pounds. Discharge weight was 281 pounds.    He underwent atrial flutter ablation on 04/06/18. Subsequently had marked 1st AV block with progression to junctional rhythm. Saw Dr. Lovena Le who wanted to follow it. Suggested cutting back carvedilol.  Previously Bidil stopped due to hypotension.  Admitted  8/21 with intractable N/V of unclear etiology, hypotension and AKI. Creatinine peaked at 4.5. Back to 1.6 at d/c. Has been followed by GI.   cMRI 7/21 LVEF 45% suspect infiltrative CM. ECV 55%  Volume overloaded at clinic f/u 6/22, weight up 50 lbs. Misunderstood metolazone directions and took for 5 days. Weight down 65 lbs and required IVF in clinic.  Follow up 9/22, weight up and down. He adjusts torsemide as needed. Considering endomyocardial bx.   Admitted 4/23 with syncope and collapse, found to be in [facility] c/w VT. Developed bradycardia and required pacing after given IV cardizem. On arrival rate was in the 50s. Had massive volume overload on exam. Had recurrent sustained VT w/ rates 200-210, converted with IV amio and moved to ICU. Echo showed EF 35-40%, LV moderately down, RV moderately reduced. Diuresed over 50 pounds with IV lasix. Underwent BosSci ICD implant. IV amio switched to po (already on mexilitene for PVCs). 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 No driving x 6 months. Discharged home, weight 238 lbs (down 57 lbs).  Saw Geneticist, Dr. Broadus John and undergoing genetic testing.   Follow up 7/23, volume up and instructed to  use Froscix daily x 3 days.  Today he returns for HF follow up. Overall feeling fine but feels he has fluid on board. He has SOB when he pushes himself. Tries to work out 7 days a week at [employer] by walking and doing light weights but has not been to the gym this week due to fatigue. Missed several days of meds this past week due to insurance issues. + LE edema. Denies palpitations, CP, dizziness, abnormal bleeding or PND/Orthopnea. Appetite ok. No fever or chills. Weight at home 265 pounds. Up 20 lbs since last visit 2 months ago.   Cardiac Studies - Echo (4/23): EF 35-40%, moderate LV dysfunction, RV moderately reduced, mild to moderate MR, moderate TR, posterior pericardial effusion  - cMRI (7/21): 1. Findings suggestive of infiltrative cardiomyopathy. Native T1 ECV 55%, with diffuse post contrast delayed myocardial enhancement and abnormal T1 myocardial nulling kinetics, consistent with a diagnosis of cardiac amyloidosis. 2.  Upper limit of normal biventricular chamber size. 3. Mildly reduced biventricular systolic function. LVEF 45%, RVEF 44%. 4. Cannot exclude possible anomalous pulmonary venous drainage in to the mid SVC. Seen on axial HASTE image series 3 image 4, but evaluation limited due to poor spatial resolution. Consider ECG gated CTA chest for further evaluation.  - Echo 2/21: EF 40-45%  - Echo 1/20: EF 35-40% - Echo 9/18: EF ~25-30%.  - Echo 3/19: EF 45-50%  - Holter 7/18: 6% PVCs - Sleep study (10/18): AHI 26/hr  - RHC (2/21): RA = 14 RV = 43/15 PA = 43/21 (  31) PCW = 15 Fick cardiac output/index = 5.7/2.4 PVR = 2.8 WU Ao sat = 97% PA sat = 73%, 75% SVC sat = 67%   - R/LHC (4/18): AO = 109/86 (97) LV =  101/28 RA =  29 RV = 50/22 PA = 55/24 (38) PCW = 26 Fick cardiac output/index = 6.4/2.6 Thermal CO/CI = 4.9/2.0 PVR = 2.5 WU FA sat = 98% PA sat = 69%, 71% SVC sat = 82%, 82% IVC sat = 66%   Assessment: 1. Normal coronary arteries  2. Mild to  moderate pulmonary HTN with normal PVR 3. Biventricular volume overload with R > L heart failure 4. Moderately decreased CO by thermodilution  5. 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 No intra-cardiac shunt  Past Medical History:  Diagnosis Date   Atrial flutter ([place])    a. s/p ablation 03/2018.   Chronic combined systolic and diastolic CHF (congestive heart failure) (HCC)    History of esophagogastroduodenoscopy (EGD)    Morbid obesity (HCC)    NICM (nonischemic cardiomyopathy) ([place])    PVC's (premature ventricular contractions)    Sleep apnea    Past Surgical History:  Procedure Laterality Date   A-FLUTTER ABLATION N/A 04/06/2018   Procedure: A-FLUTTER ABLATION;  Surgeon: Evans Lance, MD;  Location: [facility];  Service: Cardiovascular;  Laterality: N/A;   ESOPHAGEAL BRUSHING  06/19/2020   Procedure: ESOPHAGEAL BRUSHING;  Surgeon: Wilford Corner, MD;  Location: [place];  Service: Endoscopy;;   ESOPHAGOGASTRODUODENOSCOPY (EGD) WITH PROPOFOL Left 06/19/2020   Procedure: ESOPHAGOGASTRODUODENOSCOPY (EGD) WITH PROPOFOL;  Surgeon: Wilford Corner, MD;  Location: [place];  Service: Endoscopy;  Laterality: Left;   ICD IMPLANT N/A 02/22/2022   Procedure: ICD IMPLANT;  Surgeon: Evans Lance, MD;  Location: [place] [facility];  Service: Cardiovascular;  Laterality: N/A;   NO PAST SURGERIES     RIGHT HEART CATH N/A 12/20/2019   Procedure: RIGHT HEART CATH;  Surgeon: Jolaine Artist, MD;  Location: [place] [facility];  Service: Cardiovascular;  Laterality: N/A;   RIGHT/LEFT HEART CATH AND CORONARY ANGIOGRAPHY N/A 03/01/2017   Procedure: Right/Left Heart Cath and Coronary Angiography;  Surgeon: Jolaine Artist, MD;  Location: [place] [facility];  Service: Cardiovascular;  Laterality: N/A;   Current Outpatient Medications  Medication Sig Dispense Refill   allopurinol (ZYLOPRIM) 100 MG  tablet Take 2 tablets (200 mg total) by mouth daily. Please contact PCP for further refills 60 tablet 0   amiodarone (PACERONE) 200 MG tablet TAKE 1 TABLET BY MOUTH TWICE A DAY 180 tablet 3   apixaban (ELIQUIS) 5 MG TABS tablet Take 1 tablet (5 mg total) by mouth 2 (two) times daily. 60 tablet 11   empagliflozin (JARDIANCE) 10 MG TABS tablet TAKE 1 TABLET BY MOUTH EVERY DAY BEFORE BREAKFAST. 30 tablet 11   esomeprazole (NEXIUM) 40 MG capsule Take 40 mg by mouth 2 (two) times daily.     KLOR-CON M20 20 MEQ tablet TAKE 2 TABLETS BY MOUTH 2 TIMES DAILY. 224 tablet 2   mexiletine (MEXITIL) 150 MG capsule Take 1 capsule (150 mg total) by mouth every 12 (twelve) hours. (Patient taking differently: Take 150 mg by mouth every 12 (twelve) hours. Patient takes 1 tablet at 6 am and at 10-11 pm at night) 60 capsule 5   MITIGARE 0.6 MG CAPS Take 0.6 mg by mouth daily as needed (gout flare). 30 capsule  5   spironolactone (ALDACTONE) 25 MG tablet TAKE 1 TABLET BY MOUTH EVERYDAY AT BEDTIME 90 tablet 3   sucralfate (CARAFATE) 1 g tablet Take 1 g by mouth daily.      torsemide (DEMADEX) 20 MG tablet TAKE 2 TABLETS (40 MG TOTAL) BY MOUTH DAILY. NEEDS FOLLOW UP APPOINTMENT FOR ANYMORE REFILLS 60 tablet 2   No current facility-administered medications for this encounter.    Allergies:   Entresto [sacubitril-valsartan]   Social History:  The patient  reports that he has never smoked. He has never used smokeless tobacco. He reports current alcohol use. He reports that he does not use drugs.   Family History:  The patient's family history includes CVA in his mother; Gout in his father; Hypertension in his father; Multiple sclerosis in his mother; Seizures in his mother.   ROS:  Please see the history of present illness.   All other systems are personally reviewed and negative.   BP 110/68   Pulse 61   Wt 123.8 kg (273 lb)   SpO2 99%   BMI 41.51 kg/m   Recent Labs: 03/11/2022: Hemoglobin 15.6; Magnesium 2.0;  Platelets 198 03/31/2022: ALT 38; TSH 10.491 06/03/2022: B Natriuretic Peptide 157.0 06/08/2022: BUN 18; Creatinine, Ser 1.75; Potassium 3.3; Sodium 137  Personally reviewed   Wt Readings from Last 3 Encounters:  08/10/22 123.8 kg (273 lb)  06/03/22 116 kg (255 lb 12.8 oz)  05/26/22 114.3 kg (252 lb)    Physical Exam:   General:  NAD. No resp difficulty, walked into clinic. HEENT: Normal Neck: Supple. JVP to ear Carotids 2+ bilat; no bruits. No lymphadenopathy or thryomegaly appreciated. Cor: PMI nondisplaced. Regular rate & rhythm. No rubs, gallops or murmurs. Lungs: Diminished Abdomen: Obese, nontender, nondistended. No hepatosplenomegaly. No bruits or masses. Good bowel sounds. Extremities: No cyanosis, clubbing, rash, 3+ BLE pre-tibial edema Neuro: Alert & oriented x 3, cranial nerves grossly intact. Moves all 4 extremities w/o difficulty. Affect pleasant.  ICD interrogation (personally reviewed): HL score 14, 2.2 hr/day activity, average HR 67 bpm, no VT  ReDs: 44%  ASSESSMENT AND PLAN: 1. Syncope/VT - 04/23 > secondary to VT - s/p ICD 4/23. - Continue mexiletine 150 mg bid   - Continue amiodarone 200 mg bid.  - Continue Eliquis. Samples given. - No VT on device today.  - Followed by Dr. Lovena Le   2. Acute on Chronic HFrEF - Echo (1/20):  EF 35-40% with mild RV dysfunction RVSP 40 mmHG - Echo (2/21): EF 40-45% Moderate RV dysfunction  - PYP (4/22). Ratio 1.26 read as equivocal.  SPEP negative - cMRI repeated (11/22): LVEF 43%, RVEF 44%, LGE concerning for sarcoid.  - cMRI (8/21): EF 45% + infiltrative process suggestive of possible amyloidosis - Echo (4/23): EF 35-40% Moderate RV dysfunction - Has seen geneticist, Dr. Broadus John. Work up c/w LMNA. His mother will be undergoing genetic testing in the near future. - Stable NYHA II. Volume up on exam, weight up 20 lbs, REDs 44% - Take Furoscix 80 mg daily + 40 extra KCL daily x 3 days. Hold torsemide while using Furoscix, then  resume at 40  mg daily after completion of 3 days of Furoscix. - Continue spiro 25 mg daily.  - Continue Jardiance 10 mg daily.  - Off b-blocker with bradycardia/heart block - Did not tolerate ARNi and ARB in the past d/t hypotension, n/v. Will need to try again in future once fluid status improved.  - No Bidil (hypotension in the past) -  Labs today. - I asked him to wear his compression hose.   3. CKD Stage IIIb - Scr runs 1.7-2.0 - BMET today.   4.Chronic AF/AFL - S/p AFL ablation 5/19 with Dr. Lovena Le. - Continue Eliquis 5 mg bid. No bleeding issues. - Continue amiodarone 200 mg bid. Rate controlled.  - Elevated TSH but normal free T4 (5/23). Repeat at follow up.   5.  OSA - Continue CPAP.   6. PVCs/VT - Continue mexiletine + amiodarone. - Better suppressed.  - No ventricular arrhythmias on device check today. - Followed by EP   7. Obesity - Body mass index is 41.51 kg/m. - Consider referral to pharmacy for semaglutide.  Follow up in 1 week with APP (recheck fluid, check amio labs/BMET) and 3 months with Dr. Haroldine Laws.  Rafael Bihari, FNP  3:37 PM   FUROSCIX prescribed  Patient viewed patient education video with QR code for Caren Griffins code for [place] placed on AVS  Call [facility] [employer] at [phone] for questions regarding on body infuser.  Day 1  FUROSCIX 80 mg once daily  via on body infuser + KDUR 40   Day 2  FUROSCIX 80 mg once daily  via on body infuser+ KDUR 40   Day 3 FUROSCIX  80 mg once daily  via on body infuser+ KDUR 40  *Hold torsemide while using Furoscix.

## 2022-08-10 NOTE — Progress Notes (Signed)
ReDS Vest / Clip - 08/10/22 1500       ReDS Vest / Clip   Station Marker C    Ruler Value 26.5    ReDS Value Range High volume overload    ReDS Actual Value 44

## 2022-08-11 ENCOUNTER — Other Ambulatory Visit (HOSPITAL_COMMUNITY): Payer: Self-pay

## 2022-08-11 MED ORDER — FUROSCIX 80 MG/10ML ~~LOC~~ CTKT
80.0000 mg | CARTRIDGE | SUBCUTANEOUS | 0 refills | Status: DC
Start: 2022-08-11 — End: 2022-08-22

## 2022-08-11 NOTE — Telephone Encounter (Signed)
Meds ordered this encounter  Medications   Furosemide (FUROSCIX) 80 MG/10ML CTKT    Sig: Inject 80 mg into the skin as directed.    Dispense:  3 each    Refill:  0

## 2022-08-15 ENCOUNTER — Encounter (HOSPITAL_COMMUNITY): Payer: Medicaid Other

## 2022-08-19 ENCOUNTER — Ambulatory Visit (HOSPITAL_COMMUNITY)
Admission: RE | Admit: 2022-08-19 | Discharge: 2022-08-19 | Disposition: A | Discharge status: Home / Self Care | Payer: 59 | Source: Ambulatory Visit | Attending: Cardiology | Admitting: Cardiology

## 2022-08-19 ENCOUNTER — Encounter (HOSPITAL_COMMUNITY): Payer: Self-pay

## 2022-08-19 VITALS — BP 116/72 | HR 71 | Wt 275.0 lb

## 2022-08-19 DIAGNOSIS — I4892 Unspecified atrial flutter: Secondary | ICD-10-CM | POA: Diagnosis not present

## 2022-08-19 DIAGNOSIS — I130 Hypertensive heart and chronic kidney disease with heart failure and stage 1 through stage 4 chronic kidney disease, or unspecified chronic kidney disease: Secondary | ICD-10-CM | POA: Insufficient documentation

## 2022-08-19 DIAGNOSIS — M109 Gout, unspecified: Secondary | ICD-10-CM | POA: Diagnosis not present

## 2022-08-19 DIAGNOSIS — N1832 Chronic kidney disease, stage 3b: Secondary | ICD-10-CM | POA: Insufficient documentation

## 2022-08-19 DIAGNOSIS — I5023 Acute on chronic systolic (congestive) heart failure: Secondary | ICD-10-CM | POA: Diagnosis not present

## 2022-08-19 DIAGNOSIS — Z95810 Presence of automatic (implantable) cardiac defibrillator: Secondary | ICD-10-CM | POA: Diagnosis not present

## 2022-08-19 DIAGNOSIS — Z7984 Long term (current) use of oral hypoglycemic drugs: Secondary | ICD-10-CM | POA: Diagnosis not present

## 2022-08-19 DIAGNOSIS — I5022 Chronic systolic (congestive) heart failure: Secondary | ICD-10-CM | POA: Diagnosis not present

## 2022-08-19 DIAGNOSIS — I472 Ventricular tachycardia, unspecified: Secondary | ICD-10-CM | POA: Diagnosis not present

## 2022-08-19 DIAGNOSIS — R55 Syncope and collapse: Secondary | ICD-10-CM | POA: Insufficient documentation

## 2022-08-19 DIAGNOSIS — R946 Abnormal results of thyroid function studies: Secondary | ICD-10-CM | POA: Diagnosis not present

## 2022-08-19 DIAGNOSIS — Z79899 Other long term (current) drug therapy: Secondary | ICD-10-CM | POA: Diagnosis not present

## 2022-08-19 DIAGNOSIS — Z7901 Long term (current) use of anticoagulants: Secondary | ICD-10-CM | POA: Diagnosis not present

## 2022-08-19 DIAGNOSIS — Z6841 Body Mass Index (BMI) 40.0 and over, adult: Secondary | ICD-10-CM | POA: Diagnosis not present

## 2022-08-19 DIAGNOSIS — G4733 Obstructive sleep apnea (adult) (pediatric): Secondary | ICD-10-CM | POA: Insufficient documentation

## 2022-08-19 DIAGNOSIS — I482 Chronic atrial fibrillation, unspecified: Secondary | ICD-10-CM | POA: Diagnosis not present

## 2022-08-19 LAB — BASIC METABOLIC PANEL
Anion gap: 9 (ref 5–15)
BUN: 18 mg/dL (ref 6–20)
CO2: 29 mmol/L (ref 22–32)
Calcium: 8.2 mg/dL — ABNORMAL LOW (ref 8.9–10.3)
Chloride: 101 mmol/L (ref 98–111)
Creatinine, Ser: 1.87 mg/dL — ABNORMAL HIGH (ref 0.61–1.24)
GFR, Estimated: 47 mL/min — ABNORMAL LOW (ref >60)
Glucose, Bld: 105 mg/dL — ABNORMAL HIGH (ref 70–99)
Potassium: 4.4 mmol/L (ref 3.5–5.1)
Sodium: 139 mmol/L (ref 135–145)

## 2022-08-19 LAB — BRAIN NATRIURETIC PEPTIDE: B Natriuretic Peptide: 207.4 pg/mL — ABNORMAL HIGH (ref 0.0–100.0)

## 2022-08-19 MED ORDER — METOLAZONE 2.5 MG PO TABS: 2.5000 mg | ORAL_TABLET | Freq: Every day | ORAL | 0 refills | Status: DC

## 2022-08-19 MED ORDER — TORSEMIDE 20 MG PO TABS: 40.0000 mg | ORAL_TABLET | Freq: Two times a day (BID) | ORAL | 6 refills | Status: DC

## 2022-08-19 NOTE — Progress Notes (Signed)
Advanced Heart Failure Clinic Note   PCP:  Seward Carol, MD  GI : Dr Camie Patience.  HF Cardiologist: Dr. Haroldine Laws  Chief Complaint: Heart Failure follow up   HPI: Bradley Powell is a 41 y.o. male with a history of combined systolic/diastolic heart failure, obesity, HTN, and gout.   Admitted 4/13 through 03/07/17 with marked volume overload. EF 40-45%. Diuresed with IV lasix tid + metolazone. Had RHC/LHC as noted below. Overall diuresed 45 pounds. Discharge weight was 281 pounds.    He underwent atrial flutter ablation on 04/06/18. Subsequently had marked 1st AV block with progression to junctional rhythm. Saw Dr. Lovena Le who wanted to follow it. Suggested cutting back carvedilol.  Previously Bidil stopped due to hypotension.  Admitted  8/21 with intractable N/V of unclear etiology, hypotension and AKI. Creatinine peaked at 4.5. Back to 1.6 at d/c. Has been followed by GI.   cMRI 7/21 LVEF 45% suspect infiltrative CM. ECV 55%  Volume overloaded at clinic f/u 6/22, weight up 50 lbs. Misunderstood metolazone directions and took for 5 days. Weight down 65 lbs and required IVF in clinic.  Follow up 9/22, weight up and down. He adjusts torsemide as needed. Considering endomyocardial bx.   Admitted 4/23 with syncope and collapse, found to be in [facility] c/w VT. Developed bradycardia and required pacing after given IV cardizem. On arrival rate was in the 50s. Had massive volume overload on exam. Had recurrent sustained VT w/ rates 200-210, converted with IV amio and moved to ICU. Echo showed EF 35-40%, LV moderately down, RV moderately reduced. Diuresed over 50 pounds with IV lasix. Underwent BosSci ICD implant. IV amio switched to po (already on mexilitene for PVCs). 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 No driving x 6 months. Discharged home, weight 238 lbs (down 57 lbs).  Saw Geneticist, Dr. Broadus John and undergoing genetic testing.   Follow up 7/23, volume up and instructed to  use Froscix daily x 3 days.  Had recent f/u on 9/27. Wt was up 20 lb. ReDs 44%. Prescribed Furoscix 80 mg daily + 40 extra KCL daily x 3 days. Instructed to restart 40 mg of torsemide thereafter.   Returns today for volume assessment. Wt up 2 lb. C/w fluid overload w/ 3+ LE and abdominal edema. Tried increasing torsemide from 40 to 60 mg daily w/o much improvement. Denies resting dyspnea. Stable NYHA Class II. BP 116/72. HL score 5 (suspect R>L sided HF), 2.0 hr/day activity, no VT.   Cardiac Studies - Echo (4/23): EF 35-40%, moderate LV dysfunction, RV moderately reduced, mild to moderate MR, moderate TR, posterior pericardial effusion  - cMRI (7/21): 1. Findings suggestive of infiltrative cardiomyopathy. Native T1 ECV 55%, with diffuse post contrast delayed myocardial enhancement and abnormal T1 myocardial nulling kinetics, consistent with a diagnosis of cardiac amyloidosis. 2.  Upper limit of normal biventricular chamber size. 3. Mildly reduced biventricular systolic function. LVEF 45%, RVEF 44%. 4. Cannot exclude possible anomalous pulmonary venous drainage in to the mid SVC. Seen on axial HASTE image series 3 image 4, but evaluation limited due to poor spatial resolution. Consider ECG gated CTA chest for further evaluation.  - Echo 2/21: EF 40-45%  - Echo 1/20: EF 35-40% - Echo 9/18: EF ~25-30%.  - Echo 3/19: EF 45-50%  - Holter 7/18: 6% PVCs - Sleep study (10/18): AHI 26/hr  - RHC (2/21): RA = 14 RV = 43/15 PA = 43/21 (31) PCW = 15 Fick cardiac output/index = 5.7/2.4 PVR =  2.8 WU Ao sat = 97% PA sat = 73%, 75% SVC sat = 67%   - R/LHC (4/18): AO = 109/86 (97) LV =  101/28 RA =  29 RV = 50/22 PA = 55/24 (38) PCW = 26 Fick cardiac output/index = 6.4/2.6 Thermal CO/CI = 4.9/2.0 PVR = 2.5 WU FA sat = 98% PA sat = 69%, 71% SVC sat = 82%, 82% IVC sat = 66%   Assessment: 1. Normal coronary arteries  2. Mild to moderate pulmonary HTN with normal PVR 3.  Biventricular volume overload with R > L heart failure 4. Moderately decreased CO by thermodilution  5. 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 No intra-cardiac shunt  Past Medical History:  Diagnosis Date   Atrial flutter ([place])    a. s/p ablation 03/2018.   Chronic combined systolic and diastolic CHF (congestive heart failure) (HCC)    History of esophagogastroduodenoscopy (EGD)    Morbid obesity (HCC)    NICM (nonischemic cardiomyopathy) ([place])    PVC's (premature ventricular contractions)    Sleep apnea    Past Surgical History:  Procedure Laterality Date   A-FLUTTER ABLATION N/A 04/06/2018   Procedure: A-FLUTTER ABLATION;  Surgeon: Evans Lance, MD;  Location: [place] [facility];  Service: Cardiovascular;  Laterality: N/A;   ESOPHAGEAL BRUSHING  06/19/2020   Procedure: ESOPHAGEAL BRUSHING;  Surgeon: Wilford Corner, MD;  Location: [place];  Service: Endoscopy;;   ESOPHAGOGASTRODUODENOSCOPY (EGD) WITH PROPOFOL Left 06/19/2020   Procedure: ESOPHAGOGASTRODUODENOSCOPY (EGD) WITH PROPOFOL;  Surgeon: Wilford Corner, MD;  Location: [employer];  Service: Endoscopy;  Laterality: Left;   ICD IMPLANT N/A 02/22/2022   Procedure: ICD IMPLANT;  Surgeon: Evans Lance, MD;  Location: [place] [facility];  Service: Cardiovascular;  Laterality: N/A;   NO PAST SURGERIES     RIGHT HEART CATH N/A 12/20/2019   Procedure: RIGHT HEART CATH;  Surgeon: Jolaine Artist, MD;  Location: [place] [facility];  Service: Cardiovascular;  Laterality: N/A;   RIGHT/LEFT HEART CATH AND CORONARY ANGIOGRAPHY N/A 03/01/2017   Procedure: Right/Left Heart Cath and Coronary Angiography;  Surgeon: Jolaine Artist, MD;  Location: [place] [facility];  Service: Cardiovascular;  Laterality: N/A;   Current Outpatient Medications  Medication Sig Dispense Refill   allopurinol (ZYLOPRIM) 100 MG tablet Take 2 tablets (200 mg total) by  mouth daily. Please contact PCP for further refills 60 tablet 0   amiodarone (PACERONE) 200 MG tablet TAKE 1 TABLET BY MOUTH TWICE A DAY 180 tablet 3   apixaban (ELIQUIS) 5 MG TABS tablet Take 1 tablet (5 mg total) by mouth 2 (two) times daily. 60 tablet 11   empagliflozin (JARDIANCE) 10 MG TABS tablet TAKE 1 TABLET BY MOUTH EVERY DAY BEFORE BREAKFAST. 30 tablet 11   esomeprazole (NEXIUM) 40 MG capsule Take 40 mg by mouth 2 (two) times daily.     KLOR-CON M20 20 MEQ tablet TAKE 2 TABLETS BY MOUTH 2 TIMES DAILY. 224 tablet 2   mexiletine (MEXITIL) 150 MG capsule Take 1 capsule (150 mg total) by mouth every 12 (twelve) hours. 60 capsule 5   MITIGARE 0.6 MG CAPS Take 0.6 mg by mouth daily as needed (gout flare). 30 capsule 5   spironolactone (ALDACTONE) 25 MG tablet TAKE 1 TABLET BY MOUTH EVERYDAY AT BEDTIME 90 tablet 3   sucralfate (CARAFATE) 1 g tablet Take 1 g by mouth daily.  torsemide (DEMADEX) 20 MG tablet Patient takes 2 tablets by mouth and a extra tablet  as needed with weight gain.     Furosemide (FUROSCIX) 80 MG/10ML CTKT Inject 80 mg into the skin as directed. (Patient not taking: Reported on 08/19/2022) 3 each 0   No current facility-administered medications for this encounter.    Allergies:   Entresto [sacubitril-valsartan]   Social History:  The patient  reports that he has never smoked. He has never used smokeless tobacco. He reports current alcohol use. He reports that he does not use drugs.   Family History:  The patient's family history includes CVA in his mother; Gout in his father; Hypertension in his father; Multiple sclerosis in his mother; Seizures in his mother.   ROS:  Please see the history of present illness.   All other systems are personally reviewed and negative.   BP 116/72   Pulse 71   Wt 124.7 kg (275 lb)   SpO2 96%   BMI 41.81 kg/m   Recent Labs: 03/11/2022: Hemoglobin 15.6; Magnesium 2.0; Platelets 198 03/31/2022: ALT 38; TSH 10.491 08/10/2022: B  Natriuretic Peptide 211.5; BUN 20; Creatinine, Ser 1.73; Potassium 4.1; Sodium 140  Personally reviewed   Wt Readings from Last 3 Encounters:  08/19/22 124.7 kg (275 lb)  08/10/22 123.8 kg (273 lb)  06/03/22 116 kg (255 lb 12.8 oz)    PHYSICAL EXAM: General:  Well appearing obese. No respiratory difficulty HEENT: normal Neck: supple. JVD 12 cm. Carotids 2+ bilat; no bruits. No lymphadenopathy or thyromegaly appreciated. Cor: PMI nondisplaced. Regular rate & rhythm. No rubs, gallops or murmurs. Lungs: clear Abdomen: soft, nontender, nondistended. + edema, No hepatosplenomegaly. No bruits or masses. Good bowel sounds. Extremities: no cyanosis, clubbing, rash, 2-3+ b/l LE edema Neuro: alert & oriented x 3, cranial nerves grossly intact. moves all 4 extremities w/o difficulty. Affect pleasant.   ICD interrogation (personally reviewed): HL score 5, 2.0 hr/day activity, no VT   ASSESSMENT AND PLAN: 1. Syncope/VT - 04/23 > secondary to VT - s/p ICD 4/23. - Continue mexiletine 150 mg bid   - Continue amiodarone 200 mg bid.  - Continue Eliquis. Samples given. - No VT on device today.  - Followed by Dr. Lovena Le - Check Labs    2. Acute on Chronic HFrEF - Echo (1/20):  EF 35-40% with mild RV dysfunction RVSP 40 mmHG - Echo (2/21): EF 40-45% Moderate RV dysfunction  - PYP (4/22). Ratio 1.26 read as equivocal.  SPEP negative - cMRI repeated (11/22): LVEF 43%, RVEF 44%, LGE concerning for sarcoid.  - cMRI (8/21): EF 45% + infiltrative process suggestive of possible amyloidosis - Echo (4/23): EF 35-40% Moderate RV dysfunction - Has seen geneticist, Dr. Broadus John. Work up c/w LMNA. His mother will be undergoing genetic testing in the near future. - Stable NYHA II. Remains volume overloaded on exam despite Furoscix. JVD 12 cm, 2-3+ b/l LEE, HL score only 5 suspect R>L sided HF  - Increase torsemide to 40 mg bid - Add 2.5 mg of metolazone daily x 3 days w/ extra 40 mEq of KCl  - Continue spiro  25 mg daily.  - Continue Jardiance 10 mg daily.  - Off b-blocker with bradycardia/heart block - Did not tolerate ARNi and ARB in the past d/t hypotension, n/v. Will need to try again in future once fluid status improved.  - No Bidil (hypotension in the past) - Check BMP today - I asked him to wear his compression hose. - F/u  on Monday, if not improved may need admission for IV diuretics    3. CKD Stage IIIb - Scr runs 1.7-2.0 - Check BMP today w/ diuretic increase    4.Chronic AF/AFL - S/p AFL ablation 5/19 with Dr. Lovena Le. - Continue Eliquis 5 mg bid. No bleeding issues. - Continue amiodarone 200 mg bid. Rate controlled.  - Elevated TSH but normal free T4 (5/23). Repeat at follow up.   5.  OSA - Continue CPAP.   6. PVCs/VT - Continue mexiletine + amiodarone. - Better suppressed.  - No ventricular arrhythmias on device check today. - Followed by EP   7. Obesity - Body mass index is 41.81 kg/m. - Consider referral to pharmacy for semaglutide.  F/u on APP swing scheduled next week to reassess volume status   Lyda Jester, PA-C  3:51 PM

## 2022-08-19 NOTE — Patient Instructions (Addendum)
Thank you for coming in today  Labs were done today, if any labs are abnormal the clinic will call you No news is good news  INCREASE Torsemide to 40 mg 2 tablets twice daily  TAKE Metolazone 2.5 mg 1 tablet daily for 3 days with extra 40 mEq of Potassium  Your physician recommends that you schedule a follow-up appointment in:  Monday October 9th,2023 at 3:30 pm    Do the following things EVERYDAY: Weigh yourself in the morning before breakfast. Write it down and keep it in a log. Take your medicines as prescribed Eat low salt foods--Limit salt (sodium) to 2000 mg per day.  Stay as active as you can everyday Limit all fluids for the day to less than 2 liters At the [facility], you and your health needs are our priority. As part of our continuing mission to provide you with exceptional heart care, we have created designated Provider Care Teams. These Care Teams include your primary Cardiologist (physician) and Advanced Practice Providers (APPs- Physician Assistants and Nurse Practitioners) who all work together to provide you with the care you need, when you need it.   You may see any of the following providers on your designated Care Team at your next follow up: Dr Glori Bickers Dr Loralie Champagne Dr. Roxana Hires, NP Lyda Jester, [facility] [place], [place] Forestine Na, NP Audry Riles, PharmD   Please be sure to bring in all your medications bottles to every appointment.   If you have any questions or concerns before your next appointment please send [place] a message through [place] or call our office at [phone].    TO LEAVE A MESSAGE FOR THE NURSE SELECT OPTION 2, PLEASE LEAVE A MESSAGE INCLUDING: YOUR NAME DATE OF BIRTH CALL BACK NUMBER REASON FOR CALL**this is important as we prioritize the call backs  YOU WILL RECEIVE A CALL BACK THE SAME DAY AS LONG AS YOU CALL BEFORE 4:00 PM

## 2022-08-22 ENCOUNTER — Encounter (HOSPITAL_COMMUNITY): Payer: Self-pay

## 2022-08-22 ENCOUNTER — Ambulatory Visit (HOSPITAL_COMMUNITY)
Admission: RE | Admit: 2022-08-22 | Discharge: 2022-08-22 | Disposition: A | Discharge status: Home / Self Care | Payer: 59 | Source: Ambulatory Visit | Attending: Cardiology | Admitting: Cardiology

## 2022-08-22 VITALS — BP 120/82 | HR 67 | Ht 68.0 in | Wt 260.0 lb

## 2022-08-22 DIAGNOSIS — I482 Chronic atrial fibrillation, unspecified: Secondary | ICD-10-CM | POA: Insufficient documentation

## 2022-08-22 DIAGNOSIS — Z6839 Body mass index (BMI) 39.0-39.9, adult: Secondary | ICD-10-CM | POA: Insufficient documentation

## 2022-08-22 DIAGNOSIS — I472 Ventricular tachycardia, unspecified: Secondary | ICD-10-CM | POA: Insufficient documentation

## 2022-08-22 DIAGNOSIS — G4733 Obstructive sleep apnea (adult) (pediatric): Secondary | ICD-10-CM | POA: Diagnosis not present

## 2022-08-22 DIAGNOSIS — I130 Hypertensive heart and chronic kidney disease with heart failure and stage 1 through stage 4 chronic kidney disease, or unspecified chronic kidney disease: Secondary | ICD-10-CM | POA: Insufficient documentation

## 2022-08-22 DIAGNOSIS — R001 Bradycardia, unspecified: Secondary | ICD-10-CM | POA: Insufficient documentation

## 2022-08-22 DIAGNOSIS — I5042 Chronic combined systolic (congestive) and diastolic (congestive) heart failure: Secondary | ICD-10-CM | POA: Diagnosis not present

## 2022-08-22 DIAGNOSIS — E669 Obesity, unspecified: Secondary | ICD-10-CM | POA: Insufficient documentation

## 2022-08-22 DIAGNOSIS — I5082 Biventricular heart failure: Secondary | ICD-10-CM | POA: Diagnosis not present

## 2022-08-22 DIAGNOSIS — I4892 Unspecified atrial flutter: Secondary | ICD-10-CM | POA: Diagnosis not present

## 2022-08-22 DIAGNOSIS — Z79899 Other long term (current) drug therapy: Secondary | ICD-10-CM | POA: Insufficient documentation

## 2022-08-22 DIAGNOSIS — M109 Gout, unspecified: Secondary | ICD-10-CM | POA: Insufficient documentation

## 2022-08-22 DIAGNOSIS — I081 Rheumatic disorders of both mitral and tricuspid valves: Secondary | ICD-10-CM | POA: Insufficient documentation

## 2022-08-22 DIAGNOSIS — Z7984 Long term (current) use of oral hypoglycemic drugs: Secondary | ICD-10-CM | POA: Insufficient documentation

## 2022-08-22 DIAGNOSIS — I5023 Acute on chronic systolic (congestive) heart failure: Secondary | ICD-10-CM | POA: Insufficient documentation

## 2022-08-22 DIAGNOSIS — N1832 Chronic kidney disease, stage 3b: Secondary | ICD-10-CM | POA: Insufficient documentation

## 2022-08-22 DIAGNOSIS — Z95810 Presence of automatic (implantable) cardiac defibrillator: Secondary | ICD-10-CM | POA: Diagnosis not present

## 2022-08-22 DIAGNOSIS — I272 Pulmonary hypertension, unspecified: Secondary | ICD-10-CM | POA: Insufficient documentation

## 2022-08-22 DIAGNOSIS — R946 Abnormal results of thyroid function studies: Secondary | ICD-10-CM | POA: Diagnosis not present

## 2022-08-22 DIAGNOSIS — R55 Syncope and collapse: Secondary | ICD-10-CM | POA: Diagnosis not present

## 2022-08-22 DIAGNOSIS — Z7901 Long term (current) use of anticoagulants: Secondary | ICD-10-CM | POA: Insufficient documentation

## 2022-08-22 LAB — COMPREHENSIVE METABOLIC PANEL
ALT: 50 U/L — ABNORMAL HIGH (ref 0–44)
AST: 46 U/L — ABNORMAL HIGH (ref 15–41)
Albumin: 2.5 g/dL — ABNORMAL LOW (ref 3.5–5.0)
Alkaline Phosphatase: 155 U/L — ABNORMAL HIGH (ref 38–126)
Anion gap: 10 (ref 5–15)
BUN: 18 mg/dL (ref 6–20)
CO2: 33 mmol/L — ABNORMAL HIGH (ref 22–32)
Calcium: 8.9 mg/dL (ref 8.9–10.3)
Chloride: 95 mmol/L — ABNORMAL LOW (ref 98–111)
Creatinine, Ser: 1.76 mg/dL — ABNORMAL HIGH (ref 0.61–1.24)
GFR, Estimated: 50 mL/min — ABNORMAL LOW (ref >60)
Glucose, Bld: 95 mg/dL (ref 70–99)
Potassium: 3.9 mmol/L (ref 3.5–5.1)
Sodium: 138 mmol/L (ref 135–145)
Total Bilirubin: 1.9 mg/dL — ABNORMAL HIGH (ref 0.3–1.2)
Total Protein: 5.2 g/dL — ABNORMAL LOW (ref 6.5–8.1)

## 2022-08-22 LAB — T4, FREE: Free T4: 0.84 ng/dL (ref 0.61–1.12)

## 2022-08-22 LAB — TSH: TSH: 7.323 u[IU]/mL — ABNORMAL HIGH (ref 0.350–4.500)

## 2022-08-22 MED ORDER — POTASSIUM CHLORIDE CRYS ER 20 MEQ PO TBCR: 40.0000 meq | EXTENDED_RELEASE_TABLET | Freq: Two times a day (BID) | ORAL | 3 refills | Status: DC

## 2022-08-22 MED ORDER — METOLAZONE 2.5 MG PO TABS: ORAL_TABLET | ORAL | 2 refills | Status: DC

## 2022-08-22 NOTE — Progress Notes (Signed)
Advanced Heart Failure Clinic Note   PCP:  Seward Carol, MD  GI : Dr Camie Patience.  HF Cardiologist: Dr. Haroldine Laws  Chief Complaint: f/u for acute on chronic biventricular heart failure    HPI: Bradley Powell is a 41 y.o. male with a history of combined systolic/diastolic heart failure, obesity, HTN, and gout.   Admitted 4/13 through 03/07/17 with marked volume overload. EF 40-45%. Diuresed with IV lasix tid + metolazone. Had RHC/LHC as noted below. Overall diuresed 45 pounds. Discharge weight was 281 pounds.    He underwent atrial flutter ablation on 04/06/18. Subsequently had marked 1st AV block with progression to junctional rhythm. Saw Dr. Lovena Le who wanted to follow it. Suggested cutting back carvedilol.  Previously Bidil stopped due to hypotension.  Admitted  8/21 with intractable N/V of unclear etiology, hypotension and AKI. Creatinine peaked at 4.5. Back to 1.6 at d/c. Has been followed by GI.   cMRI 7/21 LVEF 45% suspect infiltrative CM. ECV 55%  Volume overloaded at clinic f/u 6/22, weight up 50 lbs. Misunderstood metolazone directions and took for 5 days. Weight down 65 lbs and required IVF in clinic.  Follow up 9/22, weight up and down. He adjusts torsemide as needed. Considering endomyocardial bx.   Admitted 4/23 with syncope and collapse, found to be in [facility] c/w VT. Developed bradycardia and required pacing after given IV cardizem. On arrival rate was in the 50s. Had massive volume overload on exam. Had recurrent sustained VT w/ rates 200-210, converted with IV amio and moved to ICU. Echo showed EF 35-40%, LV moderately down, RV moderately reduced. Diuresed over 50 pounds with IV lasix. Underwent [employer] ICD implant. IV amio switched to po (already on mexilitene for PVCs). 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 No driving x 6 months. Discharged home, weight 238 lbs (down 57 lbs).  Saw Geneticist, Dr. Broadus John and undergoing genetic testing.   Follow up 7/23,  volume up and instructed to use Furoscix daily x 3 days.  Had recent f/u on 9/27. Wt was up 20 lb. ReDs 44%. Prescribed Furoscix 80 mg daily + 40 extra KCL daily x 3 days. Instructed to restart 40 mg of torsemide thereafter.   Had return f/u on 10/6. Wt was up 2 lb. C/w fluid overload w/ 3+ LE and abdominal edema. Tried increasing torsemide from 40 to 60 mg daily w/o much improvement. Denied resting dyspnea. Stable NYHA Class II. BP 116/72. HL score 5 (suspect R>L sided HF), 2.0 hr/day activity, no VT. We opted to increase torsemide to 40 mg bid and added daily metolazone 2.5 mg x 3 doses. Instructed to wear compression stockings.   He returns today for f/u to reassess volume status. Wt down 15 lb. Still w/ mild LEE. Overall feels better. Denies fatigue. No resting dyspnea, Stable NYHA Class II. BP 124/92. No dizziness.   He was supposed to increase his torsemide to 40 mg bid last visit but has only been taking once daily. He still has mild edema on exam and worried about fluid coming back.    Cardiac Studies - Echo (4/23): EF 35-40%, moderate LV dysfunction, RV moderately reduced, mild to moderate MR, moderate TR, posterior pericardial effusion  - cMRI (7/21): 1. Findings suggestive of infiltrative cardiomyopathy. Native T1 ECV 55%, with diffuse post contrast delayed myocardial enhancement and abnormal T1 myocardial nulling kinetics, consistent with a diagnosis of cardiac amyloidosis. 2.  Upper limit of normal biventricular chamber size. 3. Mildly reduced biventricular systolic function. LVEF  45%, RVEF 44%. 4. Cannot exclude possible anomalous pulmonary venous drainage in to the mid SVC. Seen on axial HASTE image series 3 image 4, but evaluation limited due to poor spatial resolution. Consider ECG gated CTA chest for further evaluation.  - Echo 2/21: EF 40-45%  - Echo 1/20: EF 35-40% - Echo 9/18: EF ~25-30%.  - Echo 3/19: EF 45-50%  - Holter 7/18: 6% PVCs - Sleep study (10/18): AHI  26/hr  - RHC (2/21): RA = 14 RV = 43/15 PA = 43/21 (31) PCW = 15 Fick cardiac output/index = 5.7/2.4 PVR = 2.8 WU Ao sat = 97% PA sat = 73%, 75% SVC sat = 67%   - R/LHC (4/18): AO = 109/86 (97) LV =  101/28 RA =  29 RV = 50/22 PA = 55/24 (38) PCW = 26 Fick cardiac output/index = 6.4/2.6 Thermal CO/CI = 4.9/2.0 PVR = 2.5 WU FA sat = 98% PA sat = 69%, 71% SVC sat = 82%, 82% IVC sat = 66%   Assessment: 1. Normal coronary arteries  2. Mild to moderate pulmonary HTN with normal PVR 3. Biventricular volume overload with R > L heart failure 4. Moderately decreased CO by thermodilution  5. 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 No intra-cardiac shunt  Past Medical History:  Diagnosis Date   Atrial flutter ([place])    a. s/p ablation 03/2018.   Chronic combined systolic and diastolic CHF (congestive heart failure) (HCC)    History of esophagogastroduodenoscopy (EGD)    Morbid obesity (HCC)    NICM (nonischemic cardiomyopathy) ([place])    PVC's (premature ventricular contractions)    Sleep apnea    Past Surgical History:  Procedure Laterality Date   A-FLUTTER ABLATION N/A 04/06/2018   Procedure: A-FLUTTER ABLATION;  Surgeon: Evans Lance, MD;  Location: [place] [facility];  Service: Cardiovascular;  Laterality: N/A;   ESOPHAGEAL BRUSHING  06/19/2020   Procedure: ESOPHAGEAL BRUSHING;  Surgeon: Wilford Corner, MD;  Location: [place];  Service: Endoscopy;;   ESOPHAGOGASTRODUODENOSCOPY (EGD) WITH PROPOFOL Left 06/19/2020   Procedure: ESOPHAGOGASTRODUODENOSCOPY (EGD) WITH PROPOFOL;  Surgeon: Wilford Corner, MD;  Location: [place];  Service: Endoscopy;  Laterality: Left;   ICD IMPLANT N/A 02/22/2022   Procedure: ICD IMPLANT;  Surgeon: Evans Lance, MD;  Location: [place] [facility];  Service: Cardiovascular;  Laterality: N/A;   NO PAST SURGERIES     RIGHT HEART CATH N/A 12/20/2019    Procedure: RIGHT HEART CATH;  Surgeon: Jolaine Artist, MD;  Location: [place] [facility];  Service: Cardiovascular;  Laterality: N/A;   RIGHT/LEFT HEART CATH AND CORONARY ANGIOGRAPHY N/A 03/01/2017   Procedure: Right/Left Heart Cath and Coronary Angiography;  Surgeon: Jolaine Artist, MD;  Location: [place] [facility];  Service: Cardiovascular;  Laterality: N/A;   Current Outpatient Medications  Medication Sig Dispense Refill   allopurinol (ZYLOPRIM) 100 MG tablet Take 2 tablets (200 mg total) by mouth daily. Please contact PCP for further refills 60 tablet 0   amiodarone (PACERONE) 200 MG tablet TAKE 1 TABLET BY MOUTH TWICE A DAY 180 tablet 3   apixaban (ELIQUIS) 5 MG TABS tablet Take 1 tablet (5 mg total) by mouth 2 (two) times daily. 60 tablet 11   empagliflozin (JARDIANCE) 10 MG TABS tablet TAKE 1 TABLET BY MOUTH EVERY DAY BEFORE BREAKFAST. 30 tablet 11   esomeprazole (NEXIUM) 40 MG capsule Take 40 mg by mouth  2 (two) times daily.     KLOR-CON M20 20 MEQ tablet TAKE 2 TABLETS BY MOUTH 2 TIMES DAILY. 224 tablet 2   mexiletine (MEXITIL) 150 MG capsule Take 1 capsule (150 mg total) by mouth every 12 (twelve) hours. 60 capsule 5   spironolactone (ALDACTONE) 25 MG tablet TAKE 1 TABLET BY MOUTH EVERYDAY AT BEDTIME 90 tablet 3   sucralfate (CARAFATE) 1 g tablet Take 1 g by mouth daily.      torsemide (DEMADEX) 20 MG tablet Take 2 tablets (40 mg total) by mouth 2 (two) times daily. Patient takes 2 tablets by mouth and a extra tablet  as needed with weight gain. 120 tablet 6   metolazone (ZAROXOLYN) 2.5 MG tablet Take 1 tablet (2.5 mg total) by mouth daily for 3 days. Take 1 tablet daily for 3 days with extra dose of 40 mEq of Potassium (Patient not taking: Reported on 08/22/2022) 3 tablet 0   MITIGARE 0.6 MG CAPS Take 0.6 mg by mouth daily as needed (gout flare). (Patient not taking: Reported on 08/22/2022) 30 capsule 5   No current facility-administered medications for this encounter.     Allergies:   Entresto [sacubitril-valsartan]   Social History:  The patient  reports that he has never smoked. He has never used smokeless tobacco. He reports current alcohol use. He reports that he does not use drugs.   Family History:  The patient's family history includes CVA in his mother; Gout in his father; Hypertension in his father; Multiple sclerosis in his mother; Seizures in his mother.   ROS:  Please see the history of present illness.   All other systems are personally reviewed and negative.   BP (!) 124/92   Pulse 67   Ht 5\' 8"  (1.727 m)   Wt 117.9 kg (260 lb)   SpO2 99%   BMI 39.53 kg/m   Recent Labs: 03/11/2022: Hemoglobin 15.6; Magnesium 2.0; Platelets 198 03/31/2022: ALT 38; TSH 10.491 08/19/2022: B Natriuretic Peptide 207.4; BUN 18; Creatinine, Ser 1.87; Potassium 4.4; Sodium 139  Personally reviewed   Wt Readings from Last 3 Encounters:  08/22/22 117.9 kg (260 lb)  08/19/22 124.7 kg (275 lb)  08/10/22 123.8 kg (273 lb)    PHYSICAL EXAM: General:  Well appearing obese young male. No respiratory difficulty HEENT: normal Neck: supple. JVD 10 cm. Carotids 2+ bilat; no bruits. No lymphadenopathy or thyromegaly appreciated. Cor: PMI nondisplaced. Regular rate & rhythm. No rubs, gallops or murmurs. Lungs: CTAB  Abdomen: soft, obese, nontender, nondistended. No hepatosplenomegaly. No bruits or masses. Good bowel sounds. Extremities: no cyanosis, clubbing, rash, 1+b/l LE edema Neuro: alert & oriented x 3, cranial nerves grossly intact. moves all 4 extremities w/o difficulty. Affect pleasant.   ICD interrogation: not performed    ASSESSMENT AND PLAN: 1. Syncope/VT - 04/23 > secondary to VT - s/p ICD 4/23. - Continue mexiletine 150 mg bid   - Continue amiodarone 200 mg bid. Check TFTs and HFTs today  - Continue Eliquis 5 mg bid  - No VT on device interrogation 10/6.  - Followed by Dr. Lovena Le - check BMP to ensure K stable after recent diuresis    2.  Acute on Chronic HFrEF - Echo (1/20):  EF 35-40% with mild RV dysfunction RVSP 40 mmHG - Echo (2/21): EF 40-45% Moderate RV dysfunction  - PYP (4/22). Ratio 1.26 read as equivocal.  SPEP negative - cMRI repeated (11/22): LVEF 43%, RVEF 44%, LGE concerning for sarcoid.  - cMRI (8/21): EF  45% + infiltrative process suggestive of possible amyloidosis - Echo (4/23): EF 35-40% Moderate RV dysfunction - Has seen geneticist, Dr. Broadus John. Work up c/w LMNA. His mother will be undergoing genetic testing in the near future. - Stable NYHA II. Volume improved w/ diuretic adjustment, wt down 15 lb. C/w mild fluid overload on exam  - Increase torsemide to 40 mg bid and KCl to 40 mEq bid  - 2.5 mg of metolazone daily PRN + extra 40 mEq of KCl on metolazone days as directed by the [facility] (instructed to call if > 5 lb gain in 1 wk)  - Continue spiro 25 mg daily.  - Continue Jardiance 10 mg daily.  - Off b-blocker with bradycardia/heart block - Did not tolerate ARNi and ARB in the past d/t hypotension, n/v.  - No Bidil (hypotension in the past) - Check CMP today. Needs repeat BMP in 1 wk w/ torsemide/KCl increase  - W/ his RV failure, I encouraged him to wear LE compression daily to help w/ LEE     3. CKD Stage IIIb - Scr runs 1.7-2.0 - Check CMP today and f/u BMP in 1 wk w/ diuretic increase    4.Chronic AF/AFL - S/p AFL ablation 5/19 with Dr. Lovena Le. - Continue Eliquis 5 mg bid. No bleeding issues. - Continue amiodarone 200 mg bid. Rate controlled.  - Elevated TSH but normal free T4 (5/23). Repeat TSH today. Check HFTs   5.  OSA - Continue CPAP.   6. PVCs/VT - Continue mexiletine + amiodarone. - Better suppressed.  - No ventricular arrhythmias on device check today. - Followed by EP   7. Obesity - Body mass index is 39.53 kg/m. - Consider referral to pharmacy for semaglutide.  F/u w/ APP in 3-4 wks to reassess volume status.   Lyda Jester, PA-C  3:31 PM

## 2022-08-22 NOTE — Patient Instructions (Addendum)
Continue Torsemide 40 mg twice daily. Increase potassium to 40 meq twice daily. Take Metolazone 2.5 mg (one dose) as needed for weight gain of > 5 lbs in one week. Repeat lab in one week. Return to [facility] in 4 weeks.

## 2022-08-23 ENCOUNTER — Encounter: Payer: Self-pay | Admitting: Internal Medicine

## 2022-08-23 LAB — T3, FREE: T3, Free: 2.6 pg/mL (ref 2.0–4.4)

## 2022-08-24 ENCOUNTER — Ambulatory Visit (INDEPENDENT_AMBULATORY_CARE_PROVIDER_SITE_OTHER): Payer: 59

## 2022-08-24 DIAGNOSIS — I428 Other cardiomyopathies: Secondary | ICD-10-CM | POA: Diagnosis not present

## 2022-08-24 LAB — CUP PACEART REMOTE DEVICE CHECK
Battery Remaining Longevity: 174 mo
Battery Remaining Percentage: 100 %
Brady Statistic RV Percent Paced: 0 %
Date Time Interrogation Session: 20231011011000
HighPow Impedance: 72 Ohm
Implantable Lead Implant Date: 20230411
Implantable Lead Location: 753860
Implantable Lead Model: 138
Implantable Lead Serial Number: 305338
Implantable Pulse Generator Implant Date: 20230411
Lead Channel Impedance Value: 481 Ohm
Lead Channel Setting Pacing Amplitude: 3 V
Lead Channel Setting Pacing Pulse Width: 0.8 ms
Lead Channel Setting Sensing Sensitivity: 0.6 mV
Pulse Gen Serial Number: 216862

## 2022-08-29 ENCOUNTER — Ambulatory Visit (HOSPITAL_COMMUNITY)
Admission: RE | Admit: 2022-08-29 | Discharge: 2022-08-29 | Disposition: A | Discharge status: Home / Self Care | Payer: 59 | Source: Ambulatory Visit | Attending: Internal Medicine | Admitting: Internal Medicine

## 2022-08-29 DIAGNOSIS — I5042 Chronic combined systolic (congestive) and diastolic (congestive) heart failure: Secondary | ICD-10-CM | POA: Diagnosis not present

## 2022-08-29 LAB — BASIC METABOLIC PANEL
Anion gap: 8 (ref 5–15)
BUN: 24 mg/dL — ABNORMAL HIGH (ref 6–20)
CO2: 34 mmol/L — ABNORMAL HIGH (ref 22–32)
Calcium: 8.7 mg/dL — ABNORMAL LOW (ref 8.9–10.3)
Chloride: 94 mmol/L — ABNORMAL LOW (ref 98–111)
Creatinine, Ser: 1.63 mg/dL — ABNORMAL HIGH (ref 0.61–1.24)
GFR, Estimated: 55 mL/min — ABNORMAL LOW (ref >60)
Glucose, Bld: 92 mg/dL (ref 70–99)
Potassium: 3.7 mmol/L (ref 3.5–5.1)
Sodium: 136 mmol/L (ref 135–145)

## 2022-08-29 LAB — BRAIN NATRIURETIC PEPTIDE: B Natriuretic Peptide: 206 pg/mL — ABNORMAL HIGH (ref 0.0–100.0)

## 2022-09-07 NOTE — Progress Notes (Signed)
Remote ICD transmission.   

## 2022-09-16 NOTE — Progress Notes (Incomplete)
Advanced Heart Failure Clinic Note   PCP:  Seward Carol, MD  GI : Dr Camie Patience.  HF Cardiologist: Dr. Haroldine Laws  Chief Complaint: f/u for chronic biventricular heart failure    HPI: Bradley Powell is a 41 y.o. male with a history of combined systolic/diastolic heart failure, obesity, HTN, and gout.   Admitted 4/13 through 03/07/17 with marked volume overload. EF 40-45%. Diuresed with IV lasix tid + metolazone. Had RHC/LHC as noted below. Overall diuresed 45 pounds. Discharge weight was 281 pounds.    He underwent atrial flutter ablation on 04/06/18. Subsequently had marked 1st AV block with progression to junctional rhythm. Saw Dr. Lovena Le who wanted to follow it. Suggested cutting back carvedilol.  Previously Bidil stopped due to hypotension.  Admitted  8/21 with intractable N/V of unclear etiology, hypotension and AKI. Creatinine peaked at 4.5. Back to 1.6 at d/c. Has been followed by GI.   cMRI 7/21 LVEF 45% suspect infiltrative CM. ECV 55%  Volume overloaded at clinic f/u 6/22, weight up 50 lbs. Misunderstood metolazone directions and took for 5 days. Weight down 65 lbs and required IVF in clinic.  Follow up 9/22, weight up and down. He adjusts torsemide as needed. Considering endomyocardial bx.   Admitted 4/23 with syncope and collapse, found to be in [facility] c/w VT. Developed bradycardia and required pacing after given IV cardizem. On arrival rate was in the 50s. Had massive volume overload on exam. Had recurrent sustained VT w/ rates 200-210, converted with IV amio and moved to ICU. Echo showed EF 35-40%, LV moderately down, RV moderately reduced. Diuresed over 50 pounds with IV lasix. Underwent [employer] ICD implant. IV amio switched to po (already on mexilitene for PVCs). 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 No driving x 6 months. Discharged home, weight 238 lbs (down 57 lbs).  Saw Geneticist, Dr. Broadus John and undergoing genetic testing.   Follow up 7/23, volume  up and instructed to use Furoscix daily x 3 days.  Follow up 9/23, weight up 20 lbs. ReDs 44%. Prescribed Furoscix 80 mg daily + 40 extra KCL daily x 3 days. Instructed to restart torsemide 40 mg thereafter. Remained volume up at follow up 10/23. Torsemide increased to 40 bid and added metolazone 2.5 daily x 3 days.  Today he returns for HF follow up. Overall feeling fine, more fatigued. Took metolazone a week ago with good urinary response. Goes to the gym daily, mostly walks on the [place], lifts weights 3x/week and swims. No SOB at gym working out. Had 1 episode of SOB this weekend loading his truck, resolved after a couple hours of resting. Has some LE swelling, wearing compression socks. Denies palpitations, CP, dizziness, or PND/Orthopnea. Appetite ok. No fever or chills. Weight at home 258 pounds. Taking all medications.   Cardiac Studies - Echo (4/23): EF 35-40%, moderate LV dysfunction, RV moderately reduced, mild to moderate MR, moderate TR, posterior pericardial effusion  - cMRI (7/21): 1. Findings suggestive of infiltrative cardiomyopathy. Native T1 ECV 55%, with diffuse post contrast delayed myocardial enhancement and abnormal T1 myocardial nulling kinetics, consistent with a diagnosis of cardiac amyloidosis. 2.  Upper limit of normal biventricular chamber size. 3. Mildly reduced biventricular systolic function. LVEF 45%, RVEF 44%. 4. Cannot exclude possible anomalous pulmonary venous drainage in to the mid SVC. Seen on axial HASTE image series 3 image 4, but evaluation limited due to poor spatial resolution. Consider ECG gated CTA chest for further evaluation.  - Echo (2/21): EF 40-45%  -  Echo (1/20): EF 35-40% - Echo (3/19): EF 45-50%. - Echo (9/18): EF ~25-30%.  - Holter (7/18): 6% PVCs - Sleep study (10/18): AHI 26/hr  - RHC (2/21): RA = 14 RV = 43/15 PA = 43/21 (31) PCW = 15 Fick cardiac output/index = 5.7/2.4 PVR = 2.8 WU Ao sat = 97% PA sat = 73%, 75% SVC sat =  67%   - R/LHC (4/18): AO = 109/86 (97) LV =  101/28 RA =  29 RV = 50/22 PA = 55/24 (38) PCW = 26 Fick cardiac output/index = 6.4/2.6 Thermal CO/CI = 4.9/2.0 PVR = 2.5 WU FA sat = 98% PA sat = 69%, 71% SVC sat = 82%, 82% IVC sat = 66%   1. Normal coronary arteries  2. Mild to moderate pulmonary HTN with normal PVR 3. Biventricular volume overload with R > L heart failure 4. Moderately decreased CO by thermodilution  5. 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 No intra-cardiac shunt  Past Medical History:  Diagnosis Date   Atrial flutter (HCC)    a. s/p ablation 03/2018.   Chronic combined systolic and diastolic CHF (congestive heart failure) (HCC)    History of esophagogastroduodenoscopy (EGD)    Morbid obesity (HCC)    NICM (nonischemic cardiomyopathy) (HCC)    PVC's (premature ventricular contractions)    Sleep apnea    Past Surgical History:  Procedure Laterality Date   A-FLUTTER ABLATION N/A 04/06/2018   Procedure: A-FLUTTER ABLATION;  Surgeon: Marinus Maw, MD;  Location: [facility] INVASIVE CV LAB;  Service: Cardiovascular;  Laterality: N/A;   ESOPHAGEAL BRUSHING  06/19/2020   Procedure: ESOPHAGEAL BRUSHING;  Surgeon: Charlott Rakes, MD;  Location: [facility] ENDOSCOPY;  Service: Endoscopy;;   ESOPHAGOGASTRODUODENOSCOPY (EGD) WITH PROPOFOL Left 06/19/2020   Procedure: ESOPHAGOGASTRODUODENOSCOPY (EGD) WITH PROPOFOL;  Surgeon: Charlott Rakes, MD;  Location: [facility] ENDOSCOPY;  Service: Endoscopy;  Laterality: Left;   ICD IMPLANT N/A 02/22/2022   Procedure: ICD IMPLANT;  Surgeon: Marinus Maw, MD;  Location: [facility] INVASIVE CV LAB;  Service: Cardiovascular;  Laterality: N/A;   NO PAST SURGERIES     RIGHT HEART CATH N/A 12/20/2019   Procedure: RIGHT HEART CATH;  Surgeon: Dolores Patty, MD;  Location: [facility] INVASIVE CV LAB;  Service: Cardiovascular;  Laterality: N/A;   RIGHT/LEFT HEART CATH AND CORONARY  ANGIOGRAPHY N/A 03/01/2017   Procedure: Right/Left Heart Cath and Coronary Angiography;  Surgeon: Dolores Patty, MD;  Location: [facility] INVASIVE CV LAB;  Service: Cardiovascular;  Laterality: N/A;   Current Outpatient Medications  Medication Sig Dispense Refill   allopurinol (ZYLOPRIM) 100 MG tablet Take 2 tablets (200 mg total) by mouth daily. Please contact PCP for further refills 60 tablet 0   amiodarone (PACERONE) 200 MG tablet TAKE 1 TABLET BY MOUTH TWICE A DAY 180 tablet 3   apixaban (ELIQUIS) 5 MG TABS tablet Take 1 tablet (5 mg total) by mouth 2 (two) times daily. 60 tablet 11   empagliflozin (JARDIANCE) 10 MG TABS tablet TAKE 1 TABLET BY MOUTH EVERY DAY BEFORE BREAKFAST. 30 tablet 11   esomeprazole (NEXIUM) 40 MG capsule Take 40 mg by mouth 2 (two) times daily.     metolazone (ZAROXOLYN) 2.5 MG tablet Take 2.5 mg (one dose) as needed for weight gain of > 5 lbs in one week 20 tablet 2   mexiletine (MEXITIL) 150 MG capsule Take 1 capsule (150 mg total) by mouth every 12 (twelve)  hours. 60 capsule 5   MITIGARE 0.6 MG CAPS Take 0.6 mg by mouth daily as needed (gout flare). 30 capsule 5   potassium chloride SA (KLOR-CON M20) 20 MEQ tablet Take 2 tablets (40 mEq total) by mouth 2 (two) times daily. 360 tablet 3   spironolactone (ALDACTONE) 25 MG tablet TAKE 1 TABLET BY MOUTH EVERYDAY AT BEDTIME 90 tablet 3   sucralfate (CARAFATE) 1 g tablet Take 1 g by mouth daily.      torsemide (DEMADEX) 20 MG tablet Take 2 tablets (40 mg total) by mouth 2 (two) times daily. Patient takes 2 tablets by mouth and a extra tablet  as needed with weight gain. 120 tablet 6   No current facility-administered medications for this encounter.    Allergies:   Entresto [sacubitril-valsartan]   Social History:  The patient  reports that he has never smoked. He has never used smokeless tobacco. He reports current alcohol use. He reports that he does not use drugs.   Family History:  The patient's family history  includes CVA in his mother; Gout in his father; Hypertension in his father; Multiple sclerosis in his mother; Seizures in his mother.   ROS:  Please see the history of present illness.   All other systems are personally reviewed and negative.    Recent Labs: 03/11/2022: Hemoglobin 15.6; Magnesium 2.0; Platelets 198 08/22/2022: ALT 50; TSH 7.323 08/29/2022: B Natriuretic Peptide 206.0; BUN 24; Creatinine, Ser 1.63; Potassium 3.7; Sodium 136  Personally reviewed   Wt Readings from Last 3 Encounters:  09/19/22 121.1 kg (267 lb)  08/22/22 117.9 kg (260 lb)  08/19/22 124.7 kg (275 lb)    BP 102/66   Pulse 66   Wt 121.1 kg (267 lb)   SpO2 98%   BMI 40.60 kg/m   PHYSICAL EXAM: General:  NAD. No resp difficulty, walked into clinic HEENT: Normal Neck: Supple. JVP 8. Carotids 2+ bilat; no bruits. No lymphadenopathy or thryomegaly appreciated. Cor: PMI nondisplaced. Irregular rate & rhythm. No rubs, gallops or murmurs. Lungs: Clear Abdomen: Obese, soft, nontender, nondistended. No hepatosplenomegaly. No bruits or masses. Good bowel sounds. Extremities: No cyanosis, clubbing, rash, 2+ BLE pre-tibial edema Neuro: Alert & oriented x 3, cranial nerves grossly intact. Moves all 4 extremities w/o difficulty. Affect pleasant.  ICD interrogation (personally reviewed): HL score 6, 2.3 hr/day activity, no VT, average HR 69 bpm  ASSESSMENT AND PLAN: 1. Acute on Chronic HFrEF - Echo (1/20):  EF 35-40% with mild RV dysfunction RVSP 40 mmHG - Echo (2/21): EF 40-45% Moderate RV dysfunction  - PYP (4/22). Ratio 1.26 read as equivocal.  SPEP negative - cMRI repeated (11/22): LVEF 43%, RVEF 44%, LGE concerning for sarcoid.  - cMRI (8/21): EF 45% + infiltrative process suggestive of possible amyloidosis - Echo (4/23): EF 35-40% Moderate RV dysfunction - Has seen geneticist, Dr. Jomarie Longs. Work up c/w LMNA. His mother will be undergoing genetic testing in the near future. - Stable NYHA II. Continues with  volume overload, weight up 7 lbs. - Take metolazone 2/5 mg/40 KCL today, then every Sunday thereafter.  - Continue torsemide 40 mg bid and 40 KCl bid.  - Continue spironolactone 25 mg daily.  - Continue Jardiance 10 mg daily.  - Off b-blocker with bradycardia/heart block - No ARNi and ARB (hypotension and N/V in the past). - No Bidil (hypotension in the past) - Needs to wear compression hose. - Labs today. Repeat BMET in 7-10 days. - Next step will be Furoscix. He  has a couple kits at home.  2. Syncope/VT - 04/23 > secondary to VT - s/p ICD 4/23. - Continue mexiletine 150 mg bid   - Continue amiodarone 200 mg bid. Recent amio labs ok. - Continue Eliquis 5 mg bid.  - No VT on device interrogation today.  - Followed by Dr. Ladona Ridgel - Labs today.  3. CKD Stage IIIb - Baseline SCr 1.7-2.0 - Labs today.   4. Chronic AF/AFL - S/p AFL ablation 5/19 with Dr. Ladona Ridgel. - Continue Eliquis 5 mg bid. No bleeding issues. - Continue amiodarone 200 mg bid. Rate controlled.  - Amio labs OK 9/23.   5.  OSA - Continue CPAP. - Needs follow up with Dr. Mayford Knife, give Apolinar Junes  # to her office.   6. PVCs/VT - Continue mexiletine + amiodarone. - Better suppressed.  - No ventricular arrhythmias on device check today. - Followed by EP   7. Obesity - Body mass index is 40.6 kg/m. - Consider referral to pharmacy for semaglutide.  Follow up in 3-4 months with Dr. [name]. I asked him to call if addition of weekly metolazone does not keep weight & swelling down.  Jacklynn Ganong, FNP  3:46 PM

## 2022-09-19 ENCOUNTER — Encounter (HOSPITAL_COMMUNITY): Payer: Self-pay

## 2022-09-19 ENCOUNTER — Ambulatory Visit (HOSPITAL_COMMUNITY)
Admission: RE | Admit: 2022-09-19 | Discharge: 2022-09-19 | Disposition: A | Discharge status: Home / Self Care | Payer: 59 | Source: Ambulatory Visit | Attending: Cardiology | Admitting: Cardiology

## 2022-09-19 VITALS — BP 102/66 | HR 66 | Wt 267.0 lb

## 2022-09-19 DIAGNOSIS — N183 Chronic kidney disease, stage 3 unspecified: Secondary | ICD-10-CM | POA: Diagnosis not present

## 2022-09-19 DIAGNOSIS — G4733 Obstructive sleep apnea (adult) (pediatric): Secondary | ICD-10-CM | POA: Diagnosis not present

## 2022-09-19 DIAGNOSIS — R55 Syncope and collapse: Secondary | ICD-10-CM | POA: Diagnosis not present

## 2022-09-19 DIAGNOSIS — Z79899 Other long term (current) drug therapy: Secondary | ICD-10-CM | POA: Diagnosis not present

## 2022-09-19 DIAGNOSIS — I5042 Chronic combined systolic (congestive) and diastolic (congestive) heart failure: Secondary | ICD-10-CM | POA: Insufficient documentation

## 2022-09-19 DIAGNOSIS — I472 Ventricular tachycardia, unspecified: Secondary | ICD-10-CM | POA: Diagnosis not present

## 2022-09-19 DIAGNOSIS — Z95810 Presence of automatic (implantable) cardiac defibrillator: Secondary | ICD-10-CM | POA: Insufficient documentation

## 2022-09-19 DIAGNOSIS — I482 Chronic atrial fibrillation, unspecified: Secondary | ICD-10-CM | POA: Insufficient documentation

## 2022-09-19 DIAGNOSIS — N1832 Chronic kidney disease, stage 3b: Secondary | ICD-10-CM | POA: Insufficient documentation

## 2022-09-19 DIAGNOSIS — I493 Ventricular premature depolarization: Secondary | ICD-10-CM | POA: Diagnosis not present

## 2022-09-19 DIAGNOSIS — M109 Gout, unspecified: Secondary | ICD-10-CM | POA: Insufficient documentation

## 2022-09-19 DIAGNOSIS — I130 Hypertensive heart and chronic kidney disease with heart failure and stage 1 through stage 4 chronic kidney disease, or unspecified chronic kidney disease: Secondary | ICD-10-CM | POA: Diagnosis not present

## 2022-09-19 DIAGNOSIS — I959 Hypotension, unspecified: Secondary | ICD-10-CM | POA: Insufficient documentation

## 2022-09-19 DIAGNOSIS — Z7984 Long term (current) use of oral hypoglycemic drugs: Secondary | ICD-10-CM | POA: Insufficient documentation

## 2022-09-19 DIAGNOSIS — Z6841 Body Mass Index (BMI) 40.0 and over, adult: Secondary | ICD-10-CM | POA: Insufficient documentation

## 2022-09-19 DIAGNOSIS — I5023 Acute on chronic systolic (congestive) heart failure: Secondary | ICD-10-CM

## 2022-09-19 DIAGNOSIS — I4892 Unspecified atrial flutter: Secondary | ICD-10-CM | POA: Diagnosis not present

## 2022-09-19 DIAGNOSIS — Z7901 Long term (current) use of anticoagulants: Secondary | ICD-10-CM | POA: Insufficient documentation

## 2022-09-19 LAB — BASIC METABOLIC PANEL
Anion gap: 7 (ref 5–15)
BUN: 19 mg/dL (ref 6–20)
CO2: 33 mmol/L — ABNORMAL HIGH (ref 22–32)
Calcium: 8.8 mg/dL — ABNORMAL LOW (ref 8.9–10.3)
Chloride: 99 mmol/L (ref 98–111)
Creatinine, Ser: 1.77 mg/dL — ABNORMAL HIGH (ref 0.61–1.24)
GFR, Estimated: 49 mL/min — ABNORMAL LOW (ref >60)
Glucose, Bld: 100 mg/dL — ABNORMAL HIGH (ref 70–99)
Potassium: 5.6 mmol/L — ABNORMAL HIGH (ref 3.5–5.1)
Sodium: 139 mmol/L (ref 135–145)

## 2022-09-19 LAB — BRAIN NATRIURETIC PEPTIDE: B Natriuretic Peptide: 228.6 pg/mL — ABNORMAL HIGH (ref 0.0–100.0)

## 2022-09-19 MED ORDER — METOLAZONE 2.5 MG PO TABS: 2.5000 mg | ORAL_TABLET | ORAL | 2 refills | Status: DC

## 2022-09-19 NOTE — Patient Instructions (Addendum)
Thank you for coming in today  Labs were done today, if any labs are abnormal the clinic will call you No news is good news  TAKE Metolazone 2.5 mg 1 tablet with extra 40 meq of Potassium Today  Then every Sunday   PLEASE CALL DR. Landis Gandy OFFICE AND MAKE A FOLLOW UP APPOINTMENT [phone]  PLEASE [place]   Your physician recommends that you schedule a follow-up appointment in:  3-4 MONTHS with Dr. Haroldine Laws You will receive a reminder letter in the mail a few months in advance. If you don't receive a letter, please call our office to schedule the follow-up appointment.    Do the following things EVERYDAY: Weigh yourself in the morning before breakfast. Write it down and keep it in a log. Take your medicines as prescribed Eat low salt foods--Limit salt (sodium) to 2000 mg per day.  Stay as active as you can everyday Limit all fluids for the day to less than 2 liters  At the [facility], you and your health needs are our priority. As part of our continuing mission to provide you with exceptional heart care, we have created designated Provider Care Teams. These Care Teams include your primary Cardiologist (physician) and Advanced Practice Providers (APPs- Physician Assistants and Nurse Practitioners) who all work together to provide you with the care you need, when you need it.   You may see any of the following providers on your designated Care Team at your next follow up: Dr Glori Bickers Dr Loralie Champagne Dr. Roxana Hires, NP Lyda Jester, [place] [facility] [place], [place] Forestine Na, NP Audry Riles, PharmD   Please be sure to bring in all your medications bottles to every appointment.   If you have any questions or concerns before your next appointment please send [place] a message through [place] or call our office at [phone].    TO LEAVE A MESSAGE FOR THE NURSE SELECT OPTION 2, PLEASE LEAVE A MESSAGE  INCLUDING: YOUR NAME DATE OF BIRTH CALL BACK NUMBER REASON FOR CALL**this is important as we prioritize the call backs  YOU WILL RECEIVE A CALL BACK THE SAME DAY AS LONG AS YOU CALL BEFORE 4:00 PM

## 2022-09-20 ENCOUNTER — Encounter (HOSPITAL_COMMUNITY): Payer: Self-pay

## 2022-09-20 NOTE — Addendum Note (Signed)
Encounter addended by: Rafael Bihari, FNP on: 09/20/2022 8:42 AM  Actions taken: Clinical Note Signed

## 2022-09-21 ENCOUNTER — Telehealth (HOSPITAL_COMMUNITY): Payer: Self-pay | Admitting: Cardiology

## 2022-09-21 DIAGNOSIS — I5022 Chronic systolic (congestive) heart failure: Secondary | ICD-10-CM

## 2022-09-21 NOTE — Telephone Encounter (Signed)
Patient called.  Patient aware.  

## 2022-09-21 NOTE — Telephone Encounter (Signed)
-----   Message from Jacklynn Ganong, [place] sent at 09/20/2022  8:09 AM EST ----- K is elevated. Stop spiro and stop KCL suppl. Continue metolazone dose as discussed. Repeat BMET on Friday.

## 2022-09-26 ENCOUNTER — Ambulatory Visit (HOSPITAL_COMMUNITY)
Admission: RE | Admit: 2022-09-26 | Discharge: 2022-09-26 | Disposition: A | Discharge status: Home / Self Care | Payer: 59 | Source: Ambulatory Visit | Attending: Internal Medicine | Admitting: Internal Medicine

## 2022-09-26 DIAGNOSIS — I5022 Chronic systolic (congestive) heart failure: Secondary | ICD-10-CM | POA: Insufficient documentation

## 2022-09-26 LAB — BASIC METABOLIC PANEL
Anion gap: 12 (ref 5–15)
BUN: 24 mg/dL — ABNORMAL HIGH (ref 6–20)
CO2: 34 mmol/L — ABNORMAL HIGH (ref 22–32)
Calcium: 8.9 mg/dL (ref 8.9–10.3)
Chloride: 92 mmol/L — ABNORMAL LOW (ref 98–111)
Creatinine, Ser: 1.87 mg/dL — ABNORMAL HIGH (ref 0.61–1.24)
GFR, Estimated: 46 mL/min — ABNORMAL LOW (ref >60)
Glucose, Bld: 92 mg/dL (ref 70–99)
Potassium: 3.1 mmol/L — ABNORMAL LOW (ref 3.5–5.1)
Sodium: 138 mmol/L (ref 135–145)

## 2022-09-27 ENCOUNTER — Telehealth (HOSPITAL_COMMUNITY): Payer: Self-pay

## 2022-09-27 NOTE — Telephone Encounter (Signed)
Patient aware and agreeable. 

## 2022-09-28 ENCOUNTER — Other Ambulatory Visit: Payer: Self-pay | Admitting: Student

## 2022-09-28 NOTE — Telephone Encounter (Signed)
This is a CHF pt 

## 2022-10-03 ENCOUNTER — Ambulatory Visit (HOSPITAL_COMMUNITY)
Admission: RE | Admit: 2022-10-03 | Discharge: 2022-10-03 | Disposition: A | Discharge status: Home / Self Care | Payer: 59 | Source: Ambulatory Visit | Attending: Internal Medicine | Admitting: Internal Medicine

## 2022-10-03 DIAGNOSIS — I472 Ventricular tachycardia, unspecified: Secondary | ICD-10-CM | POA: Diagnosis not present

## 2022-10-03 LAB — BASIC METABOLIC PANEL
Anion gap: 10 (ref 5–15)
BUN: 30 mg/dL — ABNORMAL HIGH (ref 6–20)
CO2: 36 mmol/L — ABNORMAL HIGH (ref 22–32)
Calcium: 8.9 mg/dL (ref 8.9–10.3)
Chloride: 94 mmol/L — ABNORMAL LOW (ref 98–111)
Creatinine, Ser: 1.85 mg/dL — ABNORMAL HIGH (ref 0.61–1.24)
GFR, Estimated: 47 mL/min — ABNORMAL LOW (ref >60)
Glucose, Bld: 85 mg/dL (ref 70–99)
Potassium: 3.2 mmol/L — ABNORMAL LOW (ref 3.5–5.1)
Sodium: 140 mmol/L (ref 135–145)

## 2022-10-05 ENCOUNTER — Telehealth (HOSPITAL_COMMUNITY): Payer: Self-pay

## 2022-10-05 ENCOUNTER — Other Ambulatory Visit (HOSPITAL_COMMUNITY): Payer: Self-pay

## 2022-10-05 DIAGNOSIS — I5022 Chronic systolic (congestive) heart failure: Secondary | ICD-10-CM

## 2022-10-05 NOTE — Telephone Encounter (Signed)
Patient aware and agreeable. Labs ordered and scheduled.  He mentioned he had previously using an OTC potassium supplement in his protein shakes, but is not using any longer. He wanted me to pass along to you.

## 2022-10-14 ENCOUNTER — Ambulatory Visit (HOSPITAL_COMMUNITY)
Admission: RE | Admit: 2022-10-14 | Discharge: 2022-10-14 | Disposition: A | Discharge status: Home / Self Care | Payer: 59 | Source: Ambulatory Visit | Attending: Cardiology | Admitting: Cardiology

## 2022-10-14 DIAGNOSIS — I5022 Chronic systolic (congestive) heart failure: Secondary | ICD-10-CM | POA: Diagnosis not present

## 2022-10-14 LAB — BASIC METABOLIC PANEL
Anion gap: 11 (ref 5–15)
BUN: 23 mg/dL — ABNORMAL HIGH (ref 6–20)
CO2: 31 mmol/L (ref 22–32)
Calcium: 8.7 mg/dL — ABNORMAL LOW (ref 8.9–10.3)
Chloride: 94 mmol/L — ABNORMAL LOW (ref 98–111)
Creatinine, Ser: 1.85 mg/dL — ABNORMAL HIGH (ref 0.61–1.24)
GFR, Estimated: 47 mL/min — ABNORMAL LOW (ref >60)
Glucose, Bld: 96 mg/dL (ref 70–99)
Potassium: 3.6 mmol/L (ref 3.5–5.1)
Sodium: 136 mmol/L (ref 135–145)

## 2022-10-16 ENCOUNTER — Other Ambulatory Visit: Payer: Self-pay | Admitting: Internal Medicine

## 2022-10-17 DIAGNOSIS — Z0000 Encounter for general adult medical examination without abnormal findings: Secondary | ICD-10-CM | POA: Diagnosis not present

## 2022-10-17 DIAGNOSIS — Z13220 Encounter for screening for lipoid disorders: Secondary | ICD-10-CM | POA: Diagnosis not present

## 2022-10-19 ENCOUNTER — Other Ambulatory Visit: Payer: Self-pay | Admitting: Gastroenterology

## 2022-10-19 DIAGNOSIS — R9389 Abnormal findings on diagnostic imaging of other specified body structures: Secondary | ICD-10-CM

## 2022-10-24 DIAGNOSIS — R946 Abnormal results of thyroid function studies: Secondary | ICD-10-CM | POA: Diagnosis not present

## 2022-11-02 ENCOUNTER — Other Ambulatory Visit: Payer: 59

## 2022-11-23 ENCOUNTER — Ambulatory Visit (INDEPENDENT_AMBULATORY_CARE_PROVIDER_SITE_OTHER): Payer: 59

## 2022-11-23 DIAGNOSIS — I428 Other cardiomyopathies: Secondary | ICD-10-CM

## 2022-11-28 LAB — CUP PACEART REMOTE DEVICE CHECK
Battery Remaining Longevity: 174 mo
Battery Remaining Percentage: 100 %
Brady Statistic RV Percent Paced: 0 %
Date Time Interrogation Session: 20240110011100
HighPow Impedance: 70 Ohm
Implantable Lead Connection Status: 753985
Implantable Lead Implant Date: 20230411
Implantable Lead Location: 753860
Implantable Lead Model: 138
Implantable Lead Serial Number: 305338
Implantable Pulse Generator Implant Date: 20230411
Lead Channel Impedance Value: 484 Ohm
Lead Channel Setting Pacing Amplitude: 3 V
Lead Channel Setting Pacing Pulse Width: 0.8 ms
Lead Channel Setting Sensing Sensitivity: 0.6 mV
Pulse Gen Serial Number: 216862

## 2022-11-30 DIAGNOSIS — R946 Abnormal results of thyroid function studies: Secondary | ICD-10-CM | POA: Diagnosis not present

## 2022-12-08 ENCOUNTER — Other Ambulatory Visit (HOSPITAL_COMMUNITY): Payer: Self-pay | Admitting: Family Medicine

## 2022-12-08 DIAGNOSIS — I472 Ventricular tachycardia, unspecified: Secondary | ICD-10-CM

## 2022-12-08 DIAGNOSIS — I5023 Acute on chronic systolic (congestive) heart failure: Secondary | ICD-10-CM

## 2022-12-09 ENCOUNTER — Telehealth (HOSPITAL_COMMUNITY): Payer: Self-pay | Admitting: Cardiology

## 2022-12-09 DIAGNOSIS — R7989 Other specified abnormal findings of blood chemistry: Secondary | ICD-10-CM

## 2022-12-09 NOTE — Telephone Encounter (Signed)
Abnormal labs received from PCP Labs drawn 12/01/22 TSH 10.92  Per Allena Katz, NP Will need repeat thyroid labs Tsh ft4 ft3   Pt aware and reports he was aware tsh level was elevated. Reports PCP was letting cardiology drive treatment as these levels were likely related to ami

## 2022-12-15 DIAGNOSIS — R946 Abnormal results of thyroid function studies: Secondary | ICD-10-CM | POA: Diagnosis not present

## 2022-12-15 NOTE — Progress Notes (Signed)
Remote ICD transmission.   

## 2022-12-16 ENCOUNTER — Ambulatory Visit (HOSPITAL_COMMUNITY)
Admission: RE | Admit: 2022-12-16 | Discharge: 2022-12-16 | Disposition: A | Discharge status: Home / Self Care | Payer: 59 | Source: Ambulatory Visit | Attending: Internal Medicine | Admitting: Internal Medicine

## 2022-12-16 DIAGNOSIS — R7989 Other specified abnormal findings of blood chemistry: Secondary | ICD-10-CM | POA: Diagnosis not present

## 2022-12-16 LAB — TSH: TSH: 10.189 u[IU]/mL — ABNORMAL HIGH (ref 0.350–4.500)

## 2022-12-16 LAB — T4, FREE: Free T4: 0.84 ng/dL (ref 0.61–1.12)

## 2022-12-17 LAB — T3, FREE: T3, Free: 2.5 pg/mL (ref 2.0–4.4)

## 2022-12-19 ENCOUNTER — Telehealth (HOSPITAL_COMMUNITY): Payer: Self-pay

## 2022-12-19 ENCOUNTER — Other Ambulatory Visit (HOSPITAL_COMMUNITY): Payer: Self-pay | Admitting: Internal Medicine

## 2022-12-19 ENCOUNTER — Encounter (HOSPITAL_COMMUNITY): Payer: Self-pay

## 2022-12-19 NOTE — Progress Notes (Signed)
labs

## 2022-12-19 NOTE — Telephone Encounter (Signed)
Patient notified of labs. Patient states his PCP thought we were following thyroid levels and wants to know if related to his amiodarone or if he needs to follow up with PCP. Please advise.

## 2022-12-22 ENCOUNTER — Other Ambulatory Visit: Payer: Self-pay | Admitting: Gastroenterology

## 2022-12-22 DIAGNOSIS — K7460 Unspecified cirrhosis of liver: Secondary | ICD-10-CM

## 2023-01-04 ENCOUNTER — Other Ambulatory Visit (HOSPITAL_COMMUNITY): Payer: Self-pay

## 2023-01-04 MED ORDER — TORSEMIDE 20 MG PO TABS: ORAL_TABLET | ORAL | 2 refills | Status: DC

## 2023-01-12 ENCOUNTER — Ambulatory Visit
Admission: RE | Admit: 2023-01-12 | Discharge: 2023-01-12 | Disposition: A | Discharge status: Home / Self Care | Payer: 59 | Source: Ambulatory Visit | Attending: Gastroenterology | Admitting: Gastroenterology

## 2023-01-12 DIAGNOSIS — K7460 Unspecified cirrhosis of liver: Secondary | ICD-10-CM

## 2023-01-30 ENCOUNTER — Other Ambulatory Visit (HOSPITAL_COMMUNITY): Payer: Self-pay

## 2023-01-31 ENCOUNTER — Telehealth (HOSPITAL_COMMUNITY): Payer: Self-pay

## 2023-01-31 ENCOUNTER — Other Ambulatory Visit (HOSPITAL_COMMUNITY): Payer: Self-pay

## 2023-01-31 NOTE — Telephone Encounter (Signed)
Patient Advocate Encounter  Prior authorization is required for Jardiance 10MG . PA submitted and APPROVED on 01/31/23. Test claim returns $15 copay for 30 days.  Key [id number] Effective: 01/31/23 - 01/30/24  Clista Bernhardt, CPhT Rx Patient Advocate Phone: [phone]

## 2023-02-20 DIAGNOSIS — K7460 Unspecified cirrhosis of liver: Secondary | ICD-10-CM | POA: Diagnosis not present

## 2023-02-27 ENCOUNTER — Telehealth: Payer: Self-pay

## 2023-02-27 NOTE — Telephone Encounter (Signed)
   Name: Bradley Powell  DOB: 1981/04/16  MRN: [id number]  Primary Cardiologist: None  Chart reviewed as part of pre-operative protocol coverage. Because of Bradley Powell past medical history and time since last visit, he will require a follow-up in-office visit in order to better assess preoperative cardiovascular risk.  Pre-op covering staff: - Please schedule appointment and call patient to inform them. If patient already had an upcoming appointment within acceptable timeframe, please add "pre-op clearance" to the appointment notes so provider is aware. - Please contact requesting surgeon's office via preferred method (i.e, phone, fax) to inform them of need for appointment prior to surgery.  Per office protocol, patient can hold Eliquis for 2 days prior to procedure as requested   Sharlene Dory, PA-C  02/27/2023, 12:11 PM

## 2023-02-27 NOTE — Telephone Encounter (Signed)
..     Pre-operative Risk Assessment    Patient Name: Bradley Powell  DOB: 1981-10-01 MRN: [id number]      Request for Surgical Clearance    Procedure:   ENDOSCOPY  Date of Surgery:  Clearance 05/02/23                                 Surgeon:  DR Levora Angel Surgeon's Group or Practice Name:  [facility] Phone number:  [phone] Fax number:  [phone]   Type of Clearance Requested:   - Medical  - Pharmacy:  Hold Apixaban (Eliquis) HOLD FOR 2 DAYS BEFORE   Type of Anesthesia:   PROPOFOL   Additional requests/questions:    Jola Babinski   02/27/2023, 8:11 AM

## 2023-02-27 NOTE — Telephone Encounter (Signed)
Patient with diagnosis of afib on Eliquis for anticoagulation.    Procedure: endoscopy Date of procedure: 05/02/23  CHA2DS2-VASc Score = 1  This indicates a 0.6% annual risk of stroke. The patient's score is based upon: CHF History: 1 HTN History: 0 Diabetes History: 0 Stroke History: 0 Vascular Disease History: 0 Age Score: 0 Gender Score: 0   HTN listed in some notes however BP low and not on any HTN meds, will not count.  CrCl 70mL/min using adj body weight Platelet count 198K  Per office protocol, patient can hold Eliquis for 2 days prior to procedure as requested.    **This guidance is not considered finalized until pre-operative APP has relayed final recommendations.**

## 2023-02-28 NOTE — Telephone Encounter (Signed)
Spoke w/pt, appt sch for 5/29 with Dr [name]

## 2023-03-01 NOTE — Telephone Encounter (Signed)
Pt has appt with Dr. [name] 04/12/23. I will update all parties involved.

## 2023-03-03 NOTE — Telephone Encounter (Signed)
I will forward to pre op APP for any final notes before faxing to surgeon's office.

## 2023-03-09 ENCOUNTER — Other Ambulatory Visit: Payer: Self-pay | Admitting: Gastroenterology

## 2023-03-31 ENCOUNTER — Other Ambulatory Visit (HOSPITAL_COMMUNITY): Payer: Self-pay | Admitting: Internal Medicine

## 2023-04-10 NOTE — Progress Notes (Signed)
Advanced Heart Failure Clinic Note   PCP:  [name], MD  GI : Dr Marko Plume.  HF Cardiologist: Dr. [name]  Chief Complaint: f/u for chronic biventricular heart failure    HPI: Bradley Powell is a 42 y.o. male with a history of combined systolic/diastolic heart failure, obesity, HTN, and gout.   Admitted 4/18 with marked volume overload. EF 40-45%. Diuresed with IV lasix tid + metolazone. Had RHC/LHC as noted below. Overall diuresed 45 pounds. Discharge weight was 281 pounds.    AFL ablation on 03/2018. Subsequently had marked 1st AV block with progression to junctional rhythm. Saw Dr. Ladona Ridgel who wanted to follow it. Suggested cutting back carvedilol.  Previously Bidil stopped due to hypotension.  Admitted  8/21 with intractable N/V of unclear etiology, hypotension and AKI. Creatinine peaked at 4.5. Back to 1.6 at d/c. Has been followed by GI.   cMRI 7/21 LVEF 45% suspect infiltrative CM. ECV 55%  Volume overloaded at clinic f/u 6/22, weight up 50 lbs. Misunderstood metolazone directions and took for 5 days. Weight down 65 lbs and required IVF in clinic.  Admitted 4/23 with VT. Had massive volume overload on exam. Had recurrent sustained VT w/ rates 200-210, converted with IV amio and moved to ICU. Echo EF 35-40% RV modHK.Marland Kitchen Diuresed over 50 pounds with IV lasix. Underwent BosSci ICD implant. IV amio switched to po (already on mexilitene for PVCs). 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   In 9/23, weight up 20 lbs. ReDs 44%. Prescribed Furoscix 80 mg daily + 40 extra KCL daily x 3 days. Instructed to restart torsemide 40 mg thereafter. Remained volume up at follow up 10/23. Torsemide increased to 40 bid and added metolazone 2.5 daily x 3 days.  Today he returns for HF follow up. Weight is up about 15 pounds. Taking torsemide 40 bid and metolazone every Sunday. No SOB, orthopnea or PND. Most of fluid in his legs. Says he is eating more. Trying to watch his fluid.  Compliant with meds. Walk Mon-Wed 1.5-2.0 miles on TM. On other days swims 1 mile and does weights.   Pending GI endoscopy 05/02/23  Cardiac Studies - Echo (4/23): EF 35-40%, moderate LV dysfunction, RV moderately reduced, mild to moderate MR, moderate TR, posterior pericardial effusion  - cMRI (7/21): 1. Findings suggestive of infiltrative cardiomyopathy. Native T1 ECV 55%, with diffuse post contrast delayed myocardial enhancement and abnormal T1 myocardial nulling kinetics, consistent with a diagnosis of cardiac amyloidosis. 2.  Upper limit of normal biventricular chamber size. 3. Mildly reduced biventricular systolic function. LVEF 45%, RVEF 44%. 4. Cannot exclude possible anomalous pulmonary venous drainage in to the mid SVC. Seen on axial HASTE image series 3 image 4, but evaluation limited due to poor spatial resolution. Consider ECG gated CTA chest for further evaluation.  - Echo (2/21): EF 40-45%  - Echo (1/20): EF 35-40% - Echo (3/19): EF 45-50%. - Echo (9/18): EF ~25-30%.  - Holter (7/18): 6% PVCs - Sleep study (10/18): AHI 26/hr  - RHC (2/21): RA = 14 RV = 43/15 PA = 43/21 (31) PCW = 15 Fick cardiac output/index = 5.7/2.4 PVR = 2.8 WU Ao sat = 97% PA sat = 73%, 75% SVC sat = 67%   - R/LHC (4/18): AO = 109/86 (97) LV =  101/28 RA =  29 RV = 50/22 PA = 55/24 (38) PCW = 26 Fick cardiac output/index = 6.4/2.6 Thermal CO/CI = 4.9/2.0 PVR = 2.5 WU FA sat = 98%  PA sat = 69%, 71% SVC sat = 82%, 82% IVC sat = 66%   1. Normal coronary arteries  2. Mild to moderate pulmonary HTN with normal PVR 3. Biventricular volume overload with R > L heart failure 4. Moderately decreased CO by thermodilution  5. 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 No intra-cardiac shunt  Past Medical History:  Diagnosis Date   Atrial flutter (HCC)    a. s/p ablation 03/2018.   Chronic combined  systolic and diastolic CHF (congestive heart failure) (HCC)    History of esophagogastroduodenoscopy (EGD)    Morbid obesity (HCC)    NICM (nonischemic cardiomyopathy) (HCC)    PVC's (premature ventricular contractions)    Sleep apnea    Past Surgical History:  Procedure Laterality Date   A-FLUTTER ABLATION N/A 04/06/2018   Procedure: A-FLUTTER ABLATION;  Surgeon: Marinus Maw, MD;  Location: [facility] INVASIVE CV LAB;  Service: Cardiovascular;  Laterality: N/A;   ESOPHAGEAL BRUSHING  06/19/2020   Procedure: ESOPHAGEAL BRUSHING;  Surgeon: Charlott Rakes, MD;  Location: [facility] ENDOSCOPY;  Service: Endoscopy;;   ESOPHAGOGASTRODUODENOSCOPY (EGD) WITH PROPOFOL Left 06/19/2020   Procedure: ESOPHAGOGASTRODUODENOSCOPY (EGD) WITH PROPOFOL;  Surgeon: Charlott Rakes, MD;  Location: [facility] ENDOSCOPY;  Service: Endoscopy;  Laterality: Left;   ICD IMPLANT N/A 02/22/2022   Procedure: ICD IMPLANT;  Surgeon: Marinus Maw, MD;  Location: [facility] INVASIVE CV LAB;  Service: Cardiovascular;  Laterality: N/A;   NO PAST SURGERIES     RIGHT HEART CATH N/A 12/20/2019   Procedure: RIGHT HEART CATH;  Surgeon: Dolores Patty, MD;  Location: [facility] INVASIVE CV LAB;  Service: Cardiovascular;  Laterality: N/A;   RIGHT/LEFT HEART CATH AND CORONARY ANGIOGRAPHY N/A 03/01/2017   Procedure: Right/Left Heart Cath and Coronary Angiography;  Surgeon: Dolores Patty, MD;  Location: [facility] INVASIVE CV LAB;  Service: Cardiovascular;  Laterality: N/A;   Current Outpatient Medications  Medication Sig Dispense Refill   allopurinol (ZYLOPRIM) 100 MG tablet Take 2 tablets (200 mg total) by mouth daily. Please contact PCP for further refills 60 tablet 0   amiodarone (PACERONE) 200 MG tablet TAKE 1 TABLET BY MOUTH TWICE A DAY 180 tablet 3   apixaban (ELIQUIS) 5 MG TABS tablet Take 1 tablet (5 mg total) by mouth 2 (two) times daily. 60 tablet 11   esomeprazole (NEXIUM) 40 MG capsule Take 40 mg by mouth 2 (two) times daily.     JARDIANCE 10 MG TABS  tablet TAKE 1 TABLET BY MOUTH EVERY DAY BEFORE BREAKFAST 30 tablet 11   metolazone (ZAROXOLYN) 2.5 MG tablet TAKE 1 TABLET ONCE A WEEK. TAKE 2.5 MG (ONE DOSE) TODAY AND THEN EVERY SUNDAY WITH EXTRA OF 40 MEW OF POTASSIUM 4 tablet 3   mexiletine (MEXITIL) 150 MG capsule TAKE 1 CAPSULE (150 MG TOTAL) BY MOUTH EVERY 12 (TWELVE) HOURS. 60 capsule 5   MITIGARE 0.6 MG CAPS Take 0.6 mg by mouth daily as needed (gout flare). 30 capsule 5   sucralfate (CARAFATE) 1 g tablet Take 1 g by mouth daily.      torsemide (DEMADEX) 20 MG tablet Take 40 mg by mouth 2 (two) times daily.     No current facility-administered medications for this encounter.    Allergies:   Entresto [sacubitril-valsartan]   Social History:  The patient  reports that he has never smoked. He has never used smokeless tobacco. He reports current alcohol use. He reports that he does  not use drugs.   Family History:  The patient's family history includes CVA in his mother; Gout in his father; Hypertension in his father; Multiple sclerosis in his mother; Seizures in his mother.   ROS:  Please see the history of present illness.   All other systems are personally reviewed and negative.    Recent Labs: 08/22/2022: ALT 50 09/19/2022: B Natriuretic Peptide 228.6 10/14/2022: BUN 23; Creatinine, Ser 1.85; Potassium 3.6; Sodium 136 12/16/2022: TSH 10.189  Personally reviewed   Wt Readings from Last 3 Encounters:  04/12/23 126.6 kg (279 lb)  09/19/22 121.1 kg (267 lb)  08/22/22 117.9 kg (260 lb)    BP (!) 90/50   Pulse 65   Wt 126.6 kg (279 lb)   SpO2 98%   BMI 42.42 kg/m   PHYSICAL EXAM: General:  NAD. No resp difficulty, walked into clinic HEENT: normal Neck: supple. JVP to jaw  Carotids 2+ bilat; no bruits. No lymphadenopathy or thryomegaly appreciated. Cor:  Irregular rate & rhythm. No rubs, gallops or murmurs. Lungs: clear Abdomen: obese soft, nontender, nondistended. No hepatosplenomegaly. No bruits or masses. Good bowel  sounds. Extremities: no cyanosis, clubbing, rash, 3+ edema Neuro: alert & orientedx3, cranial nerves grossly intact. moves all 4 extremities w/o difficulty. Affect pleasant   ECG: AFL 66 low volts Personally reviewed  ICD interrogation (personally reviewed): HL score 8, 3 hr/day activity, no VT Personally reviewed   ASSESSMENT AND PLAN: 1. Acute on Chronic HFrEF - Echo (1/20):  EF 35-40% with mild RV dysfunction RVSP 40 mmHG - Echo (2/21): EF 40-45% Moderate RV dysfunction  - PYP (4/22). Ratio 1.26 read as equivocal.  SPEP negative - cMRI repeated (11/22): LVEF 43%, RVEF 44%, LGE concerning for sarcoid.  - cMRI (8/21): EF 45% + infiltrative process suggestive of possible amyloidosis - Echo (4/23): EF 35-40% Moderate RV dysfunction - Has seen geneticist, Dr. Jomarie Longs. Work up c/w LMNA.  - Stable NYHA II.Now with significant volume overload in setting of dietary indiscretion with R>L HF symptoms - Continue torsemide 40 mg bid and 40 KCl bid. Take metolazone 5mg  x 3 days + 40 kcl  (then resume metolazone 2.5 once a week) - Continue spironolactone 25 mg daily.  - Continue Jardiance 10 mg daily.  - Off b-blocker with bradycardia/heart block - No ARNi and ARB (hypotension and N/V in the past). - No Bidil (hypotension in the past) - Labs today - ICD interrogation as above - See back next week to reassess volume status and reassess for EGD clearance  2. Syncope/VT - 04/23 > secondary to VT - s/p ICD 4/23. - Continue mexiletine 150 mg bid   - Continue amiodarone 200 mg bid. Consider decreasing at next visit - Continue Eliquis 5 mg bid.  - No VT on device interrogation today.  - Followed by Dr. Ladona Ridgel - Labs today.  3. CKD Stage IIIb - Baseline SCr 1.7-2.0 - Labs today.   4. Chronic AF/AFL - S/p AFL ablation 5/19 with Dr. Ladona Ridgel. - Continue Eliquis 5 mg bid. No bleeding issues. - Continue amiodarone 200 mg bid. Rate controlled. Consider decreasing at next visit - Amio labs [facility]  9/23.Will repeat   5.  OSA - Continue CPAP. - Needs follow up with Dr. Mayford Knife   6. PVCs/VT - Continue mexiletine + amiodarone. - Better suppressed.  - No ventricular arrhythmias on device check today. Interrogated personally - Followed by EP   7. Obesity - Body mass index is 42.42 kg/m. - Consider referral to pharmacy  for semaglutide.  8. Hypothyroidism - likely amio-induced - check labs today  9. Pre-op CV evaluation - low to moderate risk for EGD/colonoscopy - but will not clear until volume status improved.  - will see next week  Total time spent 45 minutes. Over half that time spent discussing above.    Arvilla Meres, MD  3:29 PM

## 2023-04-12 ENCOUNTER — Encounter (HOSPITAL_COMMUNITY): Payer: Self-pay | Admitting: Internal Medicine

## 2023-04-12 ENCOUNTER — Ambulatory Visit (HOSPITAL_COMMUNITY)
Admission: RE | Admit: 2023-04-12 | Discharge: 2023-04-12 | Disposition: A | Discharge status: Home / Self Care | Payer: 59 | Source: Ambulatory Visit | Attending: Internal Medicine | Admitting: Internal Medicine

## 2023-04-12 VITALS — BP 90/50 | HR 65 | Wt 279.0 lb

## 2023-04-12 DIAGNOSIS — I482 Chronic atrial fibrillation, unspecified: Secondary | ICD-10-CM | POA: Insufficient documentation

## 2023-04-12 DIAGNOSIS — M109 Gout, unspecified: Secondary | ICD-10-CM | POA: Insufficient documentation

## 2023-04-12 DIAGNOSIS — E039 Hypothyroidism, unspecified: Secondary | ICD-10-CM | POA: Insufficient documentation

## 2023-04-12 DIAGNOSIS — Z79899 Other long term (current) drug therapy: Secondary | ICD-10-CM | POA: Insufficient documentation

## 2023-04-12 DIAGNOSIS — Z6841 Body Mass Index (BMI) 40.0 and over, adult: Secondary | ICD-10-CM | POA: Diagnosis not present

## 2023-04-12 DIAGNOSIS — G4733 Obstructive sleep apnea (adult) (pediatric): Secondary | ICD-10-CM | POA: Diagnosis not present

## 2023-04-12 DIAGNOSIS — I130 Hypertensive heart and chronic kidney disease with heart failure and stage 1 through stage 4 chronic kidney disease, or unspecified chronic kidney disease: Secondary | ICD-10-CM | POA: Insufficient documentation

## 2023-04-12 DIAGNOSIS — I4892 Unspecified atrial flutter: Secondary | ICD-10-CM

## 2023-04-12 DIAGNOSIS — I5023 Acute on chronic systolic (congestive) heart failure: Secondary | ICD-10-CM | POA: Insufficient documentation

## 2023-04-12 DIAGNOSIS — Z7901 Long term (current) use of anticoagulants: Secondary | ICD-10-CM | POA: Insufficient documentation

## 2023-04-12 DIAGNOSIS — I5042 Chronic combined systolic (congestive) and diastolic (congestive) heart failure: Secondary | ICD-10-CM

## 2023-04-12 DIAGNOSIS — N183 Chronic kidney disease, stage 3 unspecified: Secondary | ICD-10-CM | POA: Diagnosis not present

## 2023-04-12 DIAGNOSIS — Z8249 Family history of ischemic heart disease and other diseases of the circulatory system: Secondary | ICD-10-CM | POA: Insufficient documentation

## 2023-04-12 DIAGNOSIS — E669 Obesity, unspecified: Secondary | ICD-10-CM | POA: Diagnosis not present

## 2023-04-12 DIAGNOSIS — N1832 Chronic kidney disease, stage 3b: Secondary | ICD-10-CM | POA: Diagnosis not present

## 2023-04-12 DIAGNOSIS — Z4502 Encounter for adjustment and management of automatic implantable cardiac defibrillator: Secondary | ICD-10-CM | POA: Insufficient documentation

## 2023-04-12 DIAGNOSIS — I472 Ventricular tachycardia, unspecified: Secondary | ICD-10-CM

## 2023-04-12 LAB — COMPREHENSIVE METABOLIC PANEL
ALT: 43 U/L (ref 0–44)
AST: 46 U/L — ABNORMAL HIGH (ref 15–41)
Albumin: 2.6 g/dL — ABNORMAL LOW (ref 3.5–5.0)
Alkaline Phosphatase: 114 U/L (ref 38–126)
Anion gap: 15 (ref 5–15)
BUN: 27 mg/dL — ABNORMAL HIGH (ref 6–20)
CO2: 36 mmol/L — ABNORMAL HIGH (ref 22–32)
Calcium: 8.9 mg/dL (ref 8.9–10.3)
Chloride: 88 mmol/L — ABNORMAL LOW (ref 98–111)
Creatinine, Ser: 2.06 mg/dL — ABNORMAL HIGH (ref 0.61–1.24)
GFR, Estimated: 41 mL/min — ABNORMAL LOW (ref >60)
Glucose, Bld: 95 mg/dL (ref 70–99)
Potassium: 3.7 mmol/L (ref 3.5–5.1)
Sodium: 139 mmol/L (ref 135–145)
Total Bilirubin: 1.9 mg/dL — ABNORMAL HIGH (ref 0.3–1.2)
Total Protein: 5.5 g/dL — ABNORMAL LOW (ref 6.5–8.1)

## 2023-04-12 LAB — T4, FREE: Free T4: 0.47 ng/dL — ABNORMAL LOW (ref 0.61–1.12)

## 2023-04-12 LAB — BRAIN NATRIURETIC PEPTIDE: B Natriuretic Peptide: 150.5 pg/mL — ABNORMAL HIGH (ref 0.0–100.0)

## 2023-04-12 LAB — TSH: TSH: 20.433 u[IU]/mL — ABNORMAL HIGH (ref 0.350–4.500)

## 2023-04-12 MED ORDER — METOLAZONE 2.5 MG PO TABS
ORAL_TABLET | ORAL | 3 refills | Status: DC
Start: 2023-04-12 — End: 2023-05-23

## 2023-04-12 MED ORDER — POTASSIUM CHLORIDE CRYS ER 20 MEQ PO TBCR: 20.0000 meq | EXTENDED_RELEASE_TABLET | ORAL | 3 refills | Status: DC

## 2023-04-12 NOTE — Patient Instructions (Signed)
TAKE METOLAZONE ( 2 Tab) daily for 3 days with 40 mEq of Potassium   Labs done today, your results will be available in MyChart, we will contact you for abnormal readings.  Your physician recommends that you schedule a follow-up appointment on Tuesday at 3.30 pm.  If you have any questions or concerns before your next appointment please send [place] a message through [place] or call our office at [phone].    TO LEAVE A MESSAGE FOR THE NURSE SELECT OPTION 2, PLEASE LEAVE A MESSAGE INCLUDING: YOUR NAME DATE OF BIRTH CALL BACK NUMBER REASON FOR CALL**this is important as we prioritize the call backs  YOU WILL RECEIVE A CALL BACK THE SAME DAY AS LONG AS YOU CALL BEFORE 4:00 PM  At the [facility], you and your health needs are our priority. As part of our continuing mission to provide you with exceptional heart care, we have created designated Provider Care Teams. These Care Teams include your primary Cardiologist (physician) and Advanced Practice Providers (APPs- Physician Assistants and Nurse Practitioners) who all work together to provide you with the care you need, when you need it.   You may see any of the following providers on your designated Care Team at your next follow up: Dr Arvilla Meres Dr Marca Ancona Dr. Marcos Eke, NP Robbie Lis, [place] [facility] [place], [place] Brynda Peon, NP Karle Plumber, PharmD   Please be sure to bring in all your medications bottles to every appointment.    Thank you for choosing [facility]

## 2023-04-13 ENCOUNTER — Encounter (HOSPITAL_COMMUNITY): Payer: Self-pay

## 2023-04-13 LAB — T3, FREE: T3, Free: 2.7 pg/mL (ref 2.0–4.4)

## 2023-04-17 NOTE — Progress Notes (Signed)
Advanced Heart Failure Clinic Note   PCP:  [name], MD  GI : Dr Marko Plume.  HF Cardiologist: Dr. [name]  Chief Complaint: f/u for chronic biventricular heart failure    HPI: Bradley Powell is a 42 y.o. male with a history of combined systolic/diastolic heart failure, obesity, HTN, and gout.   Admitted 4/18 with marked volume overload. EF 40-45%. Diuresed with IV lasix tid + metolazone. Had RHC/LHC as noted below. Overall diuresed 45 pounds. Discharge weight was 281 pounds.    AFL ablation on 03/2018. Subsequently had marked 1st AV block with progression to junctional rhythm. Saw Dr. Ladona Ridgel who wanted to follow it. Suggested cutting back carvedilol.  Previously Bidil stopped due to hypotension.  Admitted  8/21 with intractable N/V of unclear etiology, hypotension and AKI. Creatinine peaked at 4.5. Back to 1.6 at d/c. Has been followed by GI.   cMRI 7/21 LVEF 45% suspect infiltrative CM. ECV 55%  Volume overloaded at clinic f/u 6/22, weight up 50 lbs. Misunderstood metolazone directions and took for 5 days. Weight down 65 lbs and required IVF in clinic.  Admitted 4/23 with VT. Had massive volume overload on exam. Had recurrent sustained VT w/ rates 200-210, converted with IV amio and moved to ICU. Echo EF 35-40% RV modHK.Marland Kitchen Diuresed over 50 pounds with IV lasix. Underwent BosSci ICD implant. IV amio switched to po (already on mexilitene for PVCs). 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   In 9/23, weight up 20 lbs. ReDs 44%. Prescribed Furoscix 80 mg daily + 40 extra KCL daily x 3 days. Instructed to restart torsemide 40 mg thereafter. Remained volume up at follow up 10/23. Torsemide increased to 40 bid and added metolazone 2.5 daily x 3 days.  Follow up 04/12/23, significant volume overload 2/2 dietary indiscretion. Instructed to take metolazone 5 mg/40 KCL x 3 days, then resume 2.5 once a week.  Today he returns for close HF follow up. Overall feeling fine.  No SOB with workouts. Walks on TM 30-45 mins Mon-Wed, swims Thurs-Sat. Denies palpitations, abnormal bleeding, CP, dizziness, edema, or PND/Orthopnea. Appetite ok. No fever or chills. Weight at home 255 pounds. Taking all medications. Walks Mon-Wed 1.5-2.0 miles on TM. On other days swims 1 mile and does weights. Of note, Furoscix did not work as well as metolazone. Seems confused about KCL dosing. Labs last week showed TSH 20.4, free T4 0.47.  Pending GI endoscopy 05/02/23.  Cardiac Studies - Echo (4/23): EF 35-40%, moderate LV dysfunction, RV moderately reduced, mild to moderate MR, moderate TR, posterior pericardial effusion  - cMRI (7/21): 1. Findings suggestive of infiltrative cardiomyopathy. Native T1 ECV 55%, with diffuse post contrast delayed myocardial enhancement and abnormal T1 myocardial nulling kinetics, consistent with a diagnosis of cardiac amyloidosis. 2.  Upper limit of normal biventricular chamber size. 3. Mildly reduced biventricular systolic function. LVEF 45%, RVEF 44%. 4. Cannot exclude possible anomalous pulmonary venous drainage in to the mid SVC. Seen on axial HASTE image series 3 image 4, but evaluation limited due to poor spatial resolution. Consider ECG gated CTA chest for further evaluation.  - Echo (2/21): EF 40-45%  - Echo (1/20): EF 35-40% - Echo (3/19): EF 45-50%. - Echo (9/18): EF ~25-30%.  - Holter (7/18): 6% PVCs - Sleep study (10/18): AHI 26/hr  - RHC (2/21): RA = 14 RV = 43/15 PA = 43/21 (31) PCW = 15 Fick cardiac output/index = 5.7/2.4 PVR = 2.8 WU Ao sat = 97% PA sat =  73%, 75% SVC sat = 67%   - R/LHC (4/18): AO = 109/86 (97) LV =  101/28 RA =  29 RV = 50/22 PA = 55/24 (38) PCW = 26 Fick cardiac output/index = 6.4/2.6 Thermal CO/CI = 4.9/2.0 PVR = 2.5 WU FA sat = 98% PA sat = 69%, 71% SVC sat = 82%, 82% IVC sat = 66%   1. Normal coronary arteries  2. Mild to moderate pulmonary HTN with normal PVR 3. Biventricular volume  overload with R > L heart failure 4. Moderately decreased CO by thermodilution  5. 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 No intra-cardiac shunt  Past Medical History:  Diagnosis Date   Atrial flutter (HCC)    a. s/p ablation 03/2018.   Chronic combined systolic and diastolic CHF (congestive heart failure) (HCC)    History of esophagogastroduodenoscopy (EGD)    Morbid obesity (HCC)    NICM (nonischemic cardiomyopathy) (HCC)    PVC's (premature ventricular contractions)    Sleep apnea    Past Surgical History:  Procedure Laterality Date   A-FLUTTER ABLATION N/A 04/06/2018   Procedure: A-FLUTTER ABLATION;  Surgeon: Marinus Maw, MD;  Location: [facility] INVASIVE CV LAB;  Service: Cardiovascular;  Laterality: N/A;   ESOPHAGEAL BRUSHING  06/19/2020   Procedure: ESOPHAGEAL BRUSHING;  Surgeon: Charlott Rakes, MD;  Location: [facility] ENDOSCOPY;  Service: Endoscopy;;   ESOPHAGOGASTRODUODENOSCOPY (EGD) WITH PROPOFOL Left 06/19/2020   Procedure: ESOPHAGOGASTRODUODENOSCOPY (EGD) WITH PROPOFOL;  Surgeon: Charlott Rakes, MD;  Location: [facility] ENDOSCOPY;  Service: Endoscopy;  Laterality: Left;   ICD IMPLANT N/A 02/22/2022   Procedure: ICD IMPLANT;  Surgeon: Marinus Maw, MD;  Location: [facility] INVASIVE CV LAB;  Service: Cardiovascular;  Laterality: N/A;   NO PAST SURGERIES     RIGHT HEART CATH N/A 12/20/2019   Procedure: RIGHT HEART CATH;  Surgeon: Dolores Patty, MD;  Location: [facility] INVASIVE CV LAB;  Service: Cardiovascular;  Laterality: N/A;   RIGHT/LEFT HEART CATH AND CORONARY ANGIOGRAPHY N/A 03/01/2017   Procedure: Right/Left Heart Cath and Coronary Angiography;  Surgeon: Dolores Patty, MD;  Location: [facility] INVASIVE CV LAB;  Service: Cardiovascular;  Laterality: N/A;   Current Outpatient Medications  Medication Sig Dispense Refill   allopurinol (ZYLOPRIM) 100 MG tablet Take 2 tablets (200 mg total) by mouth daily. Please  contact PCP for further refills 60 tablet 0   amiodarone (PACERONE) 200 MG tablet TAKE 1 TABLET BY MOUTH TWICE A DAY 180 tablet 3   apixaban (ELIQUIS) 5 MG TABS tablet Take 1 tablet (5 mg total) by mouth 2 (two) times daily. 60 tablet 11   esomeprazole (NEXIUM) 40 MG capsule Take 40 mg by mouth 2 (two) times daily.     JARDIANCE 10 MG TABS tablet TAKE 1 TABLET BY MOUTH EVERY DAY BEFORE BREAKFAST 30 tablet 11   metolazone (ZAROXOLYN) 2.5 MG tablet Take as directed by the heart failure clinic 4 tablet 3   mexiletine (MEXITIL) 150 MG capsule TAKE 1 CAPSULE (150 MG TOTAL) BY MOUTH EVERY 12 (TWELVE) HOURS. 60 capsule 5   MITIGARE 0.6 MG CAPS Take 0.6 mg by mouth daily as needed (gout flare). 30 capsule 5   potassium chloride SA (KLOR-CON M) 20 MEQ tablet Take 1 tablet (20 mEq total) by mouth as directed. 10 tablet 3   sucralfate (CARAFATE) 1 g tablet Take 1 g by mouth daily.      torsemide (DEMADEX) 20 MG tablet  Take 40 mg by mouth 2 (two) times daily.     No current facility-administered medications for this encounter.   Allergies:   Entresto [sacubitril-valsartan]   Social History:  The patient  reports that he has never smoked. He has never used smokeless tobacco. He reports current alcohol use. He reports that he does not use drugs.   Family History:  The patient's family history includes CVA in his mother; Gout in his father; Hypertension in his father; Multiple sclerosis in his mother; Seizures in his mother.   ROS:  Please see the history of present illness.   All other systems are personally reviewed and negative.   Recent Labs: 04/12/2023: ALT 43; B Natriuretic Peptide 150.5; BUN 27; Creatinine, Ser 2.06; Potassium 3.7; Sodium 139; TSH 20.433  Personally reviewed   Wt Readings from Last 3 Encounters:  04/18/23 120.3 kg (265 lb 3.2 oz)  04/12/23 126.6 kg (279 lb)  09/19/22 121.1 kg (267 lb)    BP 98/60   Pulse 69   Wt 120.3 kg (265 lb 3.2 oz)   SpO2 95%   BMI 40.32 kg/m    PHYSICAL EXAM: General:  NAD. No resp difficulty, walked into clinic HEENT: Normal Neck: Supple. JVP 8-9. Carotids 2+ bilat; no bruits. No lymphadenopathy or thryomegaly appreciated. Cor: PMI nondisplaced. Irregular rate & rhythm. No rubs, gallops or murmurs. Lungs: Clear Abdomen: Soft, obese, nontender, nondistended. No hepatosplenomegaly. No bruits or masses. Good bowel sounds. Extremities: No cyanosis, clubbing, rash, 2+ RLE edema (chronic) Neuro: Alert & oriented x 3, cranial nerves grossly intact. Moves all 4 extremities w/o difficulty. Affect pleasant.  ICD interrogation (personally reviewed): HL score 3, 2.6 hr/day activity, no VT, average HR 73 bpm  ReDs: 40%  ASSESSMENT AND PLAN: 1. Chronic HFrEF - Echo (1/20):  EF 35-40% with mild RV dysfunction RVSP 40 mmHG - Echo (2/21): EF 40-45% Moderate RV dysfunction  - PYP (4/22). Ratio 1.26 read as equivocal.  SPEP negative - cMRI repeated (11/22): LVEF 43%, RVEF 44%, LGE concerning for sarcoid.  - cMRI (8/21): EF 45% + infiltrative process suggestive of possible amyloidosis - Echo (4/23): EF 35-40% Moderate RV dysfunction - Has seen geneticist, Dr. Jomarie Longs. Work up c/w LMNA.  - Stable NYHA II. Volume much improved, weight down 12 lbs. ReDs 40% - Increase metolazone to 5 mg with extra 40 KCL once a week (previously on 2.5 mg) - Increase KCL to 40 bid.  - Continue torsemide 40 mg bid.  - Continue spironolactone 25 mg daily.  - Continue Jardiance 10 mg daily.  - Off b-blocker with bradycardia/heart block - No ARNi and ARB (hypotension and N/V in the past). - No Bidil (hypotension in the past) - ICD interrogation as above - Labs today.  2. Syncope/VT - 04/23 > secondary to VT - s/p ICD 4/23. - Continue mexiletine 150 mg bid   - Continue Eliquis 5 mg bid. - Decrease amiodarone to 200 mg daily.  - No VT on device interrogation today.  - Followed by Dr. Ladona Ridgel - Labs today.  3. CKD Stage IIIb - Baseline SCr 1.7-2.0 - Labs  today.   4. Chronic AF/AFL - S/p AFL ablation 5/19 with Dr. Ladona Ridgel. - Rate controlled. - Decrease amiodarone to 200 mg daily. - Continue Eliquis 5 mg bid. No bleeding issues. - TSH/free T4 abnormal (see # 8)   5.  OSA - Continue CPAP. - Needs follow up with Dr. Mayford Knife   6. PVCs/VT - Continue mexiletine + amiodarone. - Better  suppressed.  - No ventricular arrhythmias on device check today. Interrogated personally - Followed by EP   7. Obesity - Body mass index is 40.32 kg/m. - Consider referral to pharmacy for semaglutide.  8. Hypothyroidism - Likely amio-induced - Last TSH 20.4, free T4 0.47--> started on levothyroxine 50 mcg - Decrease amiodarone to 200 mg daily, as above. - Repeat TSH and free T4 in 4 weeks, may need referral to Endocrinology  9. Pre-Op CV evaluation - Volume much improved. I worry about his untreated hypothyroidism undergoing elective EGD - Would like to see back in 4 weeks and re-check TFTs before having him under-go procedure. Discussed with Dr. [name]. Will forward recs to GI. - Low to moderate risk for EGD/colonoscopy  Follow up in 4 weeks with APP.  Jacklynn Ganong, FNP  3:42 PM

## 2023-04-18 ENCOUNTER — Telehealth: Payer: Self-pay | Admitting: Home Health

## 2023-04-18 ENCOUNTER — Ambulatory Visit (HOSPITAL_COMMUNITY)
Admission: RE | Admit: 2023-04-18 | Discharge: 2023-04-18 | Disposition: A | Discharge status: Home / Self Care | Payer: 59 | Source: Ambulatory Visit | Attending: Family Medicine | Admitting: Family Medicine

## 2023-04-18 ENCOUNTER — Encounter (HOSPITAL_COMMUNITY): Payer: Self-pay

## 2023-04-18 VITALS — BP 98/60 | HR 69 | Wt 265.2 lb

## 2023-04-18 DIAGNOSIS — Z79899 Other long term (current) drug therapy: Secondary | ICD-10-CM | POA: Diagnosis not present

## 2023-04-18 DIAGNOSIS — N1832 Chronic kidney disease, stage 3b: Secondary | ICD-10-CM | POA: Insufficient documentation

## 2023-04-18 DIAGNOSIS — G4733 Obstructive sleep apnea (adult) (pediatric): Secondary | ICD-10-CM | POA: Insufficient documentation

## 2023-04-18 DIAGNOSIS — I4891 Unspecified atrial fibrillation: Secondary | ICD-10-CM

## 2023-04-18 DIAGNOSIS — I130 Hypertensive heart and chronic kidney disease with heart failure and stage 1 through stage 4 chronic kidney disease, or unspecified chronic kidney disease: Secondary | ICD-10-CM | POA: Insufficient documentation

## 2023-04-18 DIAGNOSIS — I493 Ventricular premature depolarization: Secondary | ICD-10-CM

## 2023-04-18 DIAGNOSIS — E032 Hypothyroidism due to medicaments and other exogenous substances: Secondary | ICD-10-CM

## 2023-04-18 DIAGNOSIS — I5082 Biventricular heart failure: Secondary | ICD-10-CM | POA: Diagnosis not present

## 2023-04-18 DIAGNOSIS — Z7984 Long term (current) use of oral hypoglycemic drugs: Secondary | ICD-10-CM | POA: Diagnosis not present

## 2023-04-18 DIAGNOSIS — Z79890 Hormone replacement therapy (postmenopausal): Secondary | ICD-10-CM | POA: Insufficient documentation

## 2023-04-18 DIAGNOSIS — Z01818 Encounter for other preprocedural examination: Secondary | ICD-10-CM | POA: Diagnosis not present

## 2023-04-18 DIAGNOSIS — I482 Chronic atrial fibrillation, unspecified: Secondary | ICD-10-CM | POA: Diagnosis not present

## 2023-04-18 DIAGNOSIS — I5022 Chronic systolic (congestive) heart failure: Secondary | ICD-10-CM | POA: Insufficient documentation

## 2023-04-18 DIAGNOSIS — Z4502 Encounter for adjustment and management of automatic implantable cardiac defibrillator: Secondary | ICD-10-CM | POA: Insufficient documentation

## 2023-04-18 DIAGNOSIS — N183 Chronic kidney disease, stage 3 unspecified: Secondary | ICD-10-CM | POA: Diagnosis not present

## 2023-04-18 DIAGNOSIS — E039 Hypothyroidism, unspecified: Secondary | ICD-10-CM | POA: Diagnosis not present

## 2023-04-18 DIAGNOSIS — I5023 Acute on chronic systolic (congestive) heart failure: Secondary | ICD-10-CM

## 2023-04-18 DIAGNOSIS — M109 Gout, unspecified: Secondary | ICD-10-CM | POA: Diagnosis not present

## 2023-04-18 DIAGNOSIS — I459 Conduction disorder, unspecified: Secondary | ICD-10-CM | POA: Insufficient documentation

## 2023-04-18 DIAGNOSIS — Z6841 Body Mass Index (BMI) 40.0 and over, adult: Secondary | ICD-10-CM | POA: Insufficient documentation

## 2023-04-18 DIAGNOSIS — I472 Ventricular tachycardia, unspecified: Secondary | ICD-10-CM | POA: Diagnosis not present

## 2023-04-18 DIAGNOSIS — Z7901 Long term (current) use of anticoagulants: Secondary | ICD-10-CM | POA: Insufficient documentation

## 2023-04-18 DIAGNOSIS — I4892 Unspecified atrial flutter: Secondary | ICD-10-CM | POA: Insufficient documentation

## 2023-04-18 LAB — BRAIN NATRIURETIC PEPTIDE: B Natriuretic Peptide: 171.7 pg/mL — ABNORMAL HIGH (ref 0.0–100.0)

## 2023-04-18 MED ORDER — AMIODARONE HCL 200 MG PO TABS: 200.0000 mg | ORAL_TABLET | Freq: Every day | ORAL | 6 refills | Status: DC

## 2023-04-18 MED ORDER — POTASSIUM CHLORIDE CRYS ER 20 MEQ PO TBCR: 40.0000 meq | EXTENDED_RELEASE_TABLET | Freq: Two times a day (BID) | ORAL | 6 refills | Status: DC

## 2023-04-18 MED ORDER — LEVOTHYROXINE SODIUM 50 MCG PO TABS: 50.0000 ug | ORAL_TABLET | Freq: Every day | ORAL | 6 refills | Status: DC

## 2023-04-18 MED ORDER — METOLAZONE 5 MG PO TABS: 5.0000 mg | ORAL_TABLET | ORAL | 6 refills | Status: DC

## 2023-04-18 NOTE — Patient Instructions (Addendum)
Thank you for coming in today  If you had labs drawn today, any labs that are abnormal the clinic will call you No news is good news  Medications: Increase potassium to 40 meq twice daily DECREASE Amiodarone to 200 mg 1 tablet daily  START Levothyroxine 50 mcg daily 1 hour before breakfast Change Metolazone 5 mg weekly with a extra dose of 40 meq of potassium   Follow up appointments:  Your physician recommends that you schedule a follow-up appointment in:  4-6 weeks in clinic   3-4 months with With Dr. [name] You will receive a reminder letter in the mail a few months in advance. If you don't receive a letter, please call our office to schedule the follow-up appointment.    Do the following things EVERYDAY: Weigh yourself in the morning before breakfast. Write it down and keep it in a log. Take your medicines as prescribed Eat low salt foods--Limit salt (sodium) to 2000 mg per day.  Stay as active as you can everyday Limit all fluids for the day to less than 2 liters   At the [facility], you and your health needs are our priority. As part of our continuing mission to provide you with exceptional heart care, we have created designated Provider Care Teams. These Care Teams include your primary Cardiologist (physician) and Advanced Practice Providers (APPs- Physician Assistants and Nurse Practitioners) who all work together to provide you with the care you need, when you need it.   You may see any of the following providers on your designated Care Team at your next follow up: Dr Arvilla Meres Dr Marca Ancona Dr. Marcos Eke, NP Robbie Lis, [place] [facility] [place], [place] Brynda Peon, NP Karle Plumber, PharmD   Please be sure to bring in all your medications bottles to every appointment.    Thank you for choosing [facility]  If you have any questions or concerns before your next  appointment please send [place] a message through [place] or call our office at [phone].    TO LEAVE A MESSAGE FOR THE NURSE SELECT OPTION 2, PLEASE LEAVE A MESSAGE INCLUDING: YOUR NAME DATE OF BIRTH CALL BACK NUMBER REASON FOR CALL**this is important as we prioritize the call backs  YOU WILL RECEIVE A CALL BACK THE SAME DAY AS LONG AS YOU CALL BEFORE 4:00 PM

## 2023-04-18 NOTE — Telephone Encounter (Signed)
Received page from after hour line,  regarding this patient, call back at [phone], no one answered, voicemail not available

## 2023-04-18 NOTE — Progress Notes (Signed)
ReDS Vest / Clip - 04/18/23 1500       ReDS Vest / Clip   Station Marker D    Ruler Value 34    ReDS Value Range Moderate volume overload    ReDS Actual Value 40

## 2023-04-19 ENCOUNTER — Other Ambulatory Visit (HOSPITAL_COMMUNITY): Payer: Self-pay

## 2023-04-19 ENCOUNTER — Encounter (HOSPITAL_COMMUNITY): Payer: Self-pay

## 2023-04-19 DIAGNOSIS — I5022 Chronic systolic (congestive) heart failure: Secondary | ICD-10-CM

## 2023-04-20 ENCOUNTER — Other Ambulatory Visit (HOSPITAL_COMMUNITY): Payer: Self-pay

## 2023-04-20 DIAGNOSIS — I5022 Chronic systolic (congestive) heart failure: Secondary | ICD-10-CM

## 2023-04-21 ENCOUNTER — Other Ambulatory Visit (HOSPITAL_COMMUNITY): Payer: Self-pay

## 2023-04-25 ENCOUNTER — Ambulatory Visit (HOSPITAL_COMMUNITY)
Admission: RE | Admit: 2023-04-25 | Discharge: 2023-04-25 | Disposition: A | Discharge status: Home / Self Care | Payer: 59 | Source: Ambulatory Visit | Attending: Cardiology | Admitting: Cardiology

## 2023-04-25 ENCOUNTER — Encounter (HOSPITAL_COMMUNITY): Payer: Self-pay | Admitting: Gastroenterology

## 2023-04-25 DIAGNOSIS — I5022 Chronic systolic (congestive) heart failure: Secondary | ICD-10-CM | POA: Diagnosis not present

## 2023-04-25 LAB — BASIC METABOLIC PANEL
Anion gap: 8 (ref 5–15)
BUN: 19 mg/dL (ref 6–20)
CO2: 34 mmol/L — ABNORMAL HIGH (ref 22–32)
Calcium: 9 mg/dL (ref 8.9–10.3)
Chloride: 95 mmol/L — ABNORMAL LOW (ref 98–111)
Creatinine, Ser: 2.04 mg/dL — ABNORMAL HIGH (ref 0.61–1.24)
GFR, Estimated: 42 mL/min — ABNORMAL LOW (ref >60)
Glucose, Bld: 78 mg/dL (ref 70–99)
Potassium: 3.3 mmol/L — ABNORMAL LOW (ref 3.5–5.1)
Sodium: 137 mmol/L (ref 135–145)

## 2023-04-25 LAB — MAGNESIUM: Magnesium: 1.8 mg/dL (ref 1.7–2.4)

## 2023-04-28 ENCOUNTER — Telehealth: Payer: Self-pay | Admitting: *Deleted

## 2023-04-28 NOTE — Telephone Encounter (Signed)
Procedure scheduled for 07/11/23.  Pt will be greater than a year from last appointment at procedure date.  Message sent to scheduling to advise Pt of needed appt with Dr. Ladona Ridgel or EP APP.

## 2023-04-28 NOTE — Telephone Encounter (Signed)
He needs to keep appointment with AHF on 7/9 in order to receive clearance to undergo EGD.   Notes from Prince Rome, NP at [facility] visit 04/18/23  9. Pre-Op CV evaluation - Volume much improved. I worry about his untreated hypothyroidism undergoing elective EGD - Would like to see back in 4 weeks and re-check TFTs before having him under-go procedure. Discussed with Dr. [name]. Will forward recs to GI.  - Low to moderate risk for EGD/colonoscopy  Please ensure patient and Dr. Levora Angel are aware.  Levi Aland, NP-C  04/28/2023, 10:41 AM [address], [address] Office [phone] Fax [phone]

## 2023-04-28 NOTE — Telephone Encounter (Signed)
   Pre-operative Risk Assessment    Patient Name: Bradley Powell  DOB: [birth year] MRN: [id number]      Request for Surgical Clearance    Procedure:   EGD  Date of Surgery:  Clearance 07/11/23                                 Surgeon:  Dr. Levora Angel Surgeon's Group or Practice Name:  [facility] Phone number:  [phone] Fax number:  [phone]   Type of Clearance Requested:   - Medical  - Pharmacy:  Hold Apixaban (Eliquis) 2 days prior.    Type of Anesthesia:   Propofol   Additional requests/questions:  Pt was to have this surgery on 6/18 from April. Sent to device.   Signed, Emmit Pomfret   04/28/2023, 10:05 AM

## 2023-05-03 DIAGNOSIS — Z87892 Personal history of anaphylaxis: Secondary | ICD-10-CM | POA: Diagnosis not present

## 2023-05-03 DIAGNOSIS — Z8249 Family history of ischemic heart disease and other diseases of the circulatory system: Secondary | ICD-10-CM | POA: Diagnosis not present

## 2023-05-03 DIAGNOSIS — Z888 Allergy status to other drugs, medicaments and biological substances status: Secondary | ICD-10-CM | POA: Diagnosis not present

## 2023-05-03 DIAGNOSIS — I429 Cardiomyopathy, unspecified: Secondary | ICD-10-CM | POA: Diagnosis not present

## 2023-05-03 DIAGNOSIS — K7460 Unspecified cirrhosis of liver: Secondary | ICD-10-CM | POA: Diagnosis not present

## 2023-05-03 DIAGNOSIS — E039 Hypothyroidism, unspecified: Secondary | ICD-10-CM | POA: Diagnosis not present

## 2023-05-03 DIAGNOSIS — I4892 Unspecified atrial flutter: Secondary | ICD-10-CM | POA: Diagnosis not present

## 2023-05-03 DIAGNOSIS — Z7901 Long term (current) use of anticoagulants: Secondary | ICD-10-CM | POA: Diagnosis not present

## 2023-05-03 DIAGNOSIS — I4891 Unspecified atrial fibrillation: Secondary | ICD-10-CM | POA: Diagnosis not present

## 2023-05-03 DIAGNOSIS — K219 Gastro-esophageal reflux disease without esophagitis: Secondary | ICD-10-CM | POA: Diagnosis not present

## 2023-05-03 DIAGNOSIS — I130 Hypertensive heart and chronic kidney disease with heart failure and stage 1 through stage 4 chronic kidney disease, or unspecified chronic kidney disease: Secondary | ICD-10-CM | POA: Diagnosis not present

## 2023-05-03 DIAGNOSIS — I509 Heart failure, unspecified: Secondary | ICD-10-CM | POA: Diagnosis not present

## 2023-05-09 ENCOUNTER — Encounter (HOSPITAL_COMMUNITY): Payer: Self-pay

## 2023-05-09 ENCOUNTER — Other Ambulatory Visit: Payer: Self-pay | Admitting: Family Medicine

## 2023-05-23 ENCOUNTER — Ambulatory Visit (HOSPITAL_COMMUNITY)
Admission: RE | Admit: 2023-05-23 | Discharge: 2023-05-23 | Disposition: A | Discharge status: Home / Self Care | Payer: 59 | Source: Ambulatory Visit | Attending: Family Medicine | Admitting: Family Medicine

## 2023-05-23 ENCOUNTER — Encounter (HOSPITAL_COMMUNITY): Payer: Self-pay

## 2023-05-23 VITALS — BP 105/57 | HR 71 | Wt 268.0 lb

## 2023-05-23 DIAGNOSIS — N183 Chronic kidney disease, stage 3 unspecified: Secondary | ICD-10-CM

## 2023-05-23 DIAGNOSIS — I5022 Chronic systolic (congestive) heart failure: Secondary | ICD-10-CM | POA: Diagnosis not present

## 2023-05-23 DIAGNOSIS — Z7901 Long term (current) use of anticoagulants: Secondary | ICD-10-CM | POA: Diagnosis not present

## 2023-05-23 DIAGNOSIS — Z6841 Body Mass Index (BMI) 40.0 and over, adult: Secondary | ICD-10-CM | POA: Diagnosis not present

## 2023-05-23 DIAGNOSIS — Z79899 Other long term (current) drug therapy: Secondary | ICD-10-CM | POA: Insufficient documentation

## 2023-05-23 DIAGNOSIS — I130 Hypertensive heart and chronic kidney disease with heart failure and stage 1 through stage 4 chronic kidney disease, or unspecified chronic kidney disease: Secondary | ICD-10-CM | POA: Diagnosis not present

## 2023-05-23 DIAGNOSIS — I482 Chronic atrial fibrillation, unspecified: Secondary | ICD-10-CM | POA: Diagnosis not present

## 2023-05-23 DIAGNOSIS — I472 Ventricular tachycardia, unspecified: Secondary | ICD-10-CM | POA: Diagnosis not present

## 2023-05-23 DIAGNOSIS — E039 Hypothyroidism, unspecified: Secondary | ICD-10-CM | POA: Insufficient documentation

## 2023-05-23 DIAGNOSIS — N1832 Chronic kidney disease, stage 3b: Secondary | ICD-10-CM | POA: Diagnosis not present

## 2023-05-23 DIAGNOSIS — E032 Hypothyroidism due to medicaments and other exogenous substances: Secondary | ICD-10-CM

## 2023-05-23 DIAGNOSIS — Z8249 Family history of ischemic heart disease and other diseases of the circulatory system: Secondary | ICD-10-CM | POA: Diagnosis not present

## 2023-05-23 DIAGNOSIS — Z01818 Encounter for other preprocedural examination: Secondary | ICD-10-CM

## 2023-05-23 DIAGNOSIS — Z95810 Presence of automatic (implantable) cardiac defibrillator: Secondary | ICD-10-CM | POA: Insufficient documentation

## 2023-05-23 DIAGNOSIS — Z4502 Encounter for adjustment and management of automatic implantable cardiac defibrillator: Secondary | ICD-10-CM | POA: Insufficient documentation

## 2023-05-23 DIAGNOSIS — E669 Obesity, unspecified: Secondary | ICD-10-CM | POA: Insufficient documentation

## 2023-05-23 DIAGNOSIS — G4733 Obstructive sleep apnea (adult) (pediatric): Secondary | ICD-10-CM | POA: Diagnosis not present

## 2023-05-23 DIAGNOSIS — I5082 Biventricular heart failure: Secondary | ICD-10-CM | POA: Insufficient documentation

## 2023-05-23 DIAGNOSIS — Z01810 Encounter for preprocedural cardiovascular examination: Secondary | ICD-10-CM | POA: Insufficient documentation

## 2023-05-23 DIAGNOSIS — I4891 Unspecified atrial fibrillation: Secondary | ICD-10-CM

## 2023-05-23 DIAGNOSIS — I493 Ventricular premature depolarization: Secondary | ICD-10-CM

## 2023-05-23 DIAGNOSIS — Z79890 Hormone replacement therapy (postmenopausal): Secondary | ICD-10-CM | POA: Insufficient documentation

## 2023-05-23 LAB — COMPREHENSIVE METABOLIC PANEL
ALT: 41 U/L (ref 0–44)
AST: 45 U/L — ABNORMAL HIGH (ref 15–41)
Albumin: 2.7 g/dL — ABNORMAL LOW (ref 3.5–5.0)
Alkaline Phosphatase: 97 U/L (ref 38–126)
Anion gap: 11 (ref 5–15)
BUN: 24 mg/dL — ABNORMAL HIGH (ref 6–20)
CO2: 33 mmol/L — ABNORMAL HIGH (ref 22–32)
Calcium: 9.1 mg/dL (ref 8.9–10.3)
Chloride: 92 mmol/L — ABNORMAL LOW (ref 98–111)
Creatinine, Ser: 2.05 mg/dL — ABNORMAL HIGH (ref 0.61–1.24)
GFR, Estimated: 41 mL/min — ABNORMAL LOW (ref >60)
Glucose, Bld: 88 mg/dL (ref 70–99)
Potassium: 3.6 mmol/L (ref 3.5–5.1)
Sodium: 136 mmol/L (ref 135–145)
Total Bilirubin: 2.7 mg/dL — ABNORMAL HIGH (ref 0.3–1.2)
Total Protein: 6.2 g/dL — ABNORMAL LOW (ref 6.5–8.1)

## 2023-05-23 LAB — T4, FREE: Free T4: 0.78 ng/dL (ref 0.61–1.12)

## 2023-05-23 LAB — TSH: TSH: 9.957 u[IU]/mL — ABNORMAL HIGH (ref 0.350–4.500)

## 2023-05-23 LAB — BRAIN NATRIURETIC PEPTIDE: B Natriuretic Peptide: 209.9 pg/mL — ABNORMAL HIGH (ref 0.0–100.0)

## 2023-05-23 MED ORDER — TORSEMIDE 20 MG PO TABS: 60.0000 mg | ORAL_TABLET | Freq: Two times a day (BID) | ORAL | 2 refills | Status: DC

## 2023-05-23 MED ORDER — POTASSIUM CHLORIDE CRYS ER 20 MEQ PO TBCR: EXTENDED_RELEASE_TABLET | ORAL | 1 refills | Status: DC

## 2023-05-23 NOTE — Progress Notes (Addendum)
Advanced [facility] Failure Clinic Note   PCP:  [name], MD  GI : Dr Marko Plume.  HF Cardiologist: Dr. [name]  Chief Complaint: f/u for chronic biventricular heart failure    HPI: Bradley Powell is a 42 y.o. male with a history of combined systolic/diastolic heart failure, obesity, HTN, and gout.   Admitted 4/18 with marked volume overload. EF 40-45%. Diuresed with IV lasix tid + metolazone. Had RHC/LHC as noted below. Overall diuresed 45 pounds. Discharge weight was 281 pounds.    AFL ablation on 03/2018. Subsequently had marked 1st AV block with progression to junctional rhythm. Saw Dr. Ladona Ridgel who wanted to follow it. Suggested cutting back carvedilol.  Previously Bidil stopped due to hypotension.  Admitted  8/21 with intractable N/V of unclear etiology, hypotension and AKI. Creatinine peaked at 4.5. Back to 1.6 at d/c. Has been followed by GI.   cMRI 7/21 LVEF 45% suspect infiltrative CM. ECV 55%  Volume overloaded at clinic f/u 6/22, weight up 50 lbs. Misunderstood metolazone directions and took for 5 days. Weight down 65 lbs and required IVF in clinic.  Admitted 4/23 with VT. Had massive volume overload on exam. Had recurrent sustained VT w/ rates 200-210, converted with IV amio and moved to ICU. Echo EF 35-40% RV modHK.Marland Kitchen Diuresed over 50 pounds with IV lasix. Underwent [employer] ICD implant. IV amio switched to po (already on mexilitene for PVCs). 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   In 9/23, weight up 20 lbs. ReDs 44%. Prescribed Furoscix 80 mg daily + 40 extra KCL daily x 3 days. Instructed to restart torsemide 40 mg thereafter. Remained volume up at follow up 10/23. Torsemide increased to 40 bid and added metolazone 2.5 daily x 3 days.  Follow up 04/12/23, significant volume overload 2/2 dietary indiscretion. Instructed to take metolazone 5 mg/40 KCL x 3 days, then resume 2.5 once a week.  Follow up 6/24 for pre-op clearance for endoscopy. Volume better  but still elevated, REDs 40%. Metolazone increased to 5 mg once a week with plans to repeat TFTs.  Today he returns for HF follow up. Overall feeling fine. Working out frequently most day of the week, no SOB with this. Struggling with leg cramps and misses a day during the week of diuretics. Denies palpitations, abnormal bleeding, CP, dizziness, edema, or PND/Orthopnea. Appetite ok. No fever or chills. Weight at home 256-258 pounds. Taking all medications, seems confused about KCL dosing.  Pending GI endoscopy 07/11/23.  Cardiac Studies - Echo (4/23): EF 35-40%, moderate LV dysfunction, RV moderately reduced, mild to moderate MR, moderate TR, posterior pericardial effusion  - cMRI (7/21): 1. Findings suggestive of infiltrative cardiomyopathy. Native T1 ECV 55%, with diffuse post contrast delayed myocardial enhancement and abnormal T1 myocardial nulling kinetics, consistent with a diagnosis of cardiac amyloidosis. 2.  Upper limit of normal biventricular chamber size. 3. Mildly reduced biventricular systolic function. LVEF 45%, RVEF 44%. 4. Cannot exclude possible anomalous pulmonary venous drainage in to the mid SVC. Seen on axial HASTE image series 3 image 4, but evaluation limited due to poor spatial resolution. Consider ECG gated CTA chest for further evaluation.  - Echo (2/21): EF 40-45%  - Echo (1/20): EF 35-40% - Echo (3/19): EF 45-50%. - Echo (9/18): EF ~25-30%.  - Holter (7/18): 6% PVCs - Sleep study (10/18): AHI 26/hr  - RHC (2/21): RA = 14 RV = 43/15 PA = 43/21 (31) PCW = 15 Fick cardiac output/index = 5.7/2.4 PVR = 2.8 WU  Ao sat = 97% PA sat = 73%, 75% SVC sat = 67%   - R/LHC (4/18): AO = 109/86 (97) LV =  101/28 RA =  29 RV = 50/22 PA = 55/24 (38) PCW = 26 Fick cardiac output/index = 6.4/2.6 Thermal CO/CI = 4.9/2.0 PVR = 2.5 WU FA sat = 98% PA sat = 69%, 71% SVC sat = 82%, 82% IVC sat = 66%   1. Normal coronary arteries  2. Mild to moderate pulmonary  HTN with normal PVR 3. Biventricular volume overload with R > L heart failure 4. Moderately decreased CO by thermodilution  5. 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 No intra-cardiac shunt  Past Medical History:  Diagnosis Date   Atrial flutter (HCC)    a. s/p ablation 03/2018.   Chronic combined systolic and diastolic CHF (congestive heart failure) (HCC)    History of esophagogastroduodenoscopy (EGD)    Morbid obesity (HCC)    NICM (nonischemic cardiomyopathy) (HCC)    PVC's (premature ventricular contractions)    Sleep apnea    Past Surgical History:  Procedure Laterality Date   A-FLUTTER ABLATION N/A 04/06/2018   Procedure: A-FLUTTER ABLATION;  Surgeon: Marinus Maw, MD;  Location: [facility] INVASIVE CV LAB;  Service: Cardiovascular;  Laterality: N/A;   ESOPHAGEAL BRUSHING  06/19/2020   Procedure: ESOPHAGEAL BRUSHING;  Surgeon: Charlott Rakes, MD;  Location[facility] ENDOSCOPY;  Service: Endoscopy;;   ESOPHAGOGASTRODUODENOSCOPY (EGD) WITH PROPOFOL Left 06/19/2020   Procedure: ESOPHAGOGASTRODUODENOSCOPY (EGD) WITH PROPOFOL;  Surgeon: Charlott Rakes, MD;  Location: [facility] ENDOSCOPY;  Service: Endoscopy;  Laterality: Left;   ICD IMPLANT N/A 02/22/2022   Procedure: ICD IMPLANT;  Surgeon: Marinus Maw, MD;  Location: [facility] INVASIVE CV LAB;  Service: Cardiovascular;  Laterality: N/A;   RIGHT HEART CATH N/A 12/20/2019   Procedure: RIGHT HEART CATH;  Surgeon: Dolores Patty, MD;  Location: [facility] INVASIVE CV LAB;  Service: Cardiovascular;  Laterality: N/A;   RIGHT/LEFT HEART CATH AND CORONARY ANGIOGRAPHY N/A 03/01/2017   Procedure: Right/Left Heart Cath and Coronary Angiography;  Surgeon: Dolores Patty, MD;  Location[facility] INVASIVE CV LAB;  Service: Cardiovascular;  Laterality: N/A;   Current Outpatient Medications  Medication Sig Dispense Refill   allopurinol (ZYLOPRIM) 100 MG tablet Take 2 tablets (200 mg  total) by mouth daily. Please contact PCP for further refills 60 tablet 0   amiodarone (PACERONE) 200 MG tablet Take 1 tablet (200 mg total) by mouth daily. 30 tablet 6   apixaban (ELIQUIS) 5 MG TABS tablet Take 1 tablet (5 mg total) by mouth 2 (two) times daily. 60 tablet 11   esomeprazole (NEXIUM) 40 MG capsule Take 40 mg by mouth 2 (two) times daily.     JARDIANCE 10 MG TABS tablet TAKE 1 TABLET BY MOUTH EVERY DAY BEFORE BREAKFAST 30 tablet 11   levothyroxine (SYNTHROID) 50 MCG tablet Take 1 tablet (50 mcg total) by mouth daily before breakfast. Take 1 hour before breakfast 30 tablet 6   metolazone (ZAROXOLYN) 2.5 MG tablet Take as directed by the heart failure clinic 4 tablet 3   metolazone (ZAROXOLYN) 5 MG tablet Take 1 tablet (5 mg total) by mouth once a week. 5 tablet 6   mexiletine (MEXITIL) 150 MG capsule TAKE 1 CAPSULE (150 MG TOTAL) BY MOUTH EVERY 12 (TWELVE) HOURS. 60 capsule 5   MITIGARE 0.6 MG CAPS Take 0.6 mg by mouth daily as needed (gout flare). 30  capsule 5   potassium chloride SA (KLOR-CON M) 20 MEQ tablet Take 2 tablets (40 mEq total) by mouth 2 (two) times daily. 60 tablet 6   sucralfate (CARAFATE) 1 g tablet Take 1 g by mouth daily.      torsemide (DEMADEX) 20 MG tablet Take 40 mg by mouth 2 (two) times daily.     No current facility-administered medications for this encounter.   Allergies:   Entresto [sacubitril-valsartan]   Social History:  The patient  reports that he has never smoked. He has never used smokeless tobacco. He reports current alcohol use. He reports that he does not use drugs.   Family History:  The patient's family history includes CVA in his mother; Gout in his father; Hypertension in his father; Multiple sclerosis in his mother; Seizures in his mother.   ROS:  Please see the history of present illness.   All other systems are personally reviewed and negative.   Recent Labs: 04/12/2023: ALT 43; TSH 20.433 04/18/2023: B Natriuretic Peptide  171.7 04/25/2023: BUN 19; Creatinine, Ser 2.04; Magnesium 1.8; Potassium 3.3; Sodium 137  Personally reviewed   Wt Readings from Last 3 Encounters:  05/23/23 121.6 kg (268 lb)  04/18/23 120.3 kg (265 lb 3.2 oz)  04/12/23 126.6 kg (279 lb)    BP (!) 105/57   Pulse 71   Wt 121.6 kg (268 lb)   SpO2 99%   BMI 40.75 kg/m   PHYSICAL EXAM: General:  NAD. No resp difficulty, walked into clinic HEENT: Normal Neck: Supple. JVP 7-8. Carotids 2+ bilat; no bruits. No lymphadenopathy or thryomegaly appreciated. Cor: PMI nondisplaced. Irregular rate & rhythm. No rubs, gallops or murmurs. Lungs: Clear Abdomen: Soft, obese, nontender, nondistended. No hepatosplenomegaly. No bruits or masses. Good bowel sounds. Extremities: No cyanosis, clubbing, rash, 1+RLE edema Neuro: Alert & oriented x 3, cranial nerves grossly intact. Moves all 4 extremities w/o difficulty. Affect pleasant.  ICD interrogation (personally reviewed): HL score 20, thoracic impedence ok, 1.6 hr/day activity, no VT, average HR 73 bpm  ReDs: 40%--->38% today.  ASSESSMENT AND PLAN: 1. Chronic HFrEF - Echo (1/20):  EF 35-40% with mild RV dysfunction RVSP 40 mmHG - Echo (2/21): EF 40-45% Moderate RV dysfunction  - PYP (4/22). Ratio 1.26 read as equivocal.  SPEP negative - cMRI repeated (11/22): LVEF 43%, RVEF 44%, LGE concerning for sarcoid.  - cMRI (8/21): EF 45% + infiltrative process suggestive of possible amyloidosis - Echo (4/23): EF 35-40% Moderate RV dysfunction - Has seen geneticist, Dr. Jomarie Longs. Work up c/w LMNA.  - Stable NYHA II. Volume up a bit. ReDs 38%. HL score 20, impedence ok. - Increase torsemide to 60 mg bid, increase KCL to 80/60 - Continue metolazone 5 mg /extra 40 KCL once a week - Continue spironolactone 25 mg daily.  - Continue Jardiance 10 mg daily.  - Off b-blocker with bradycardia/heart block - No ARNi and ARB (hypotension and N/V in the past). - No Bidil (hypotension in the past) - ICD interrogation  as above - Labs today. - Repeat echo at follow up  2. Syncope/VT - 04/23 > secondary to VT - s/p ICD 4/23. - Continue mexiletine 150 mg bid   - Continue Eliquis 5 mg bid. - Continue amiodarone 200 mg daily.  - No VT on device interrogation today.  - Followed by Dr. Ladona Ridgel - Labs today.  3. CKD Stage IIIb - Baseline SCr 1.7-2.0. - Labs today.   4. Chronic AF/AFL - S/p AFL ablation 5/19 with Dr.  Ladona Ridgel. - Rate controlled. - Continue amiodarone 200 mg daily. - Continue Eliquis 5 mg bid. No bleeding issues. - TSH/free T4 abnormal (see # 8).   5.  OSA - Continue CPAP. - Needs follow up with Dr. Mayford Knife.   6. PVCs/VT - Continue mexiletine + amiodarone. - Better suppressed.  - No ventricular arrhythmias on device check today. Interrogated personally - Followed by EP   7. Obesity - Body mass index is 40.75 kg/m. - Consider referral to pharmacy for semaglutide.  8. Hypothyroidism - Likely amio-induced - Last TSH 20.4, free T4 0.47--> started on levothyroxine 50 mcg - Continue amiodarone 200 mg daily (recently reduced) - Repeat TSH and free T4, may need referral to Endocrinology  9. Pre-Op CV evaluation - Volume improved. Repeat thyroid tests today - OK to hold Eliquis 2 days before procedure - Low to moderate risk for EGD from cardiac standpoint.  Follow up in 3 months with Dr. [name] + echo.  Jacklynn Ganong, FNP  3:53 PM

## 2023-05-23 NOTE — Patient Instructions (Addendum)
Medication Changes:  INCREASE TORSEMIDE TO 60mg  TWICE DAILY   TAKE POTASSIUM (4 TABLETS) IN THE MORNING AND (3 TABLETS) IN THE EVENING    Lab Work:  Labs done today, your results will be available in MyChart, we will contact you for abnormal readings.   Testing/Procedures:  Your physician has requested that you have an echocardiogram IN 3 MONTHS. Echocardiography is a painless test that uses sound waves to create images of your heart. It provides your doctor with information about the size and shape of your heart and how well your heart's chambers and valves are working. You may receive an ultrasound enhancing agent through an IV if needed to better visualize your heart during the echo.This procedure takes approximately one hour. There are no restrictions for this procedure.   Follow-Up in: 3 MONTHS WITH Dr. [name] PLEASE CALL OUR OFFICE AROUND SEPTEMBER TO GET SCHEDULED FOR YOUR APPOINTMENT. PHONE NUMBER IS [phone] OPTION 2   At the [facility], you and your health needs are our priority. We have a designated team specialized in the treatment of Heart Failure. This Care Team includes your primary Heart Failure Specialized Cardiologist (physician), Advanced Practice Providers (APPs- Physician Assistants and Nurse Practitioners), and Pharmacist who all work together to provide you with the care you need, when you need it.   You may see any of the following providers on your designated Care Team at your next follow up:  Dr. Arvilla Meres Dr. Marca Ancona Dr. Marcos Eke, NP Robbie Lis, [place] [facility] [place], [place] Brynda Peon, NP Karle Plumber, PharmD   Please be sure to bring in all your medications bottles to every appointment.   Need to Contact us:  If you have any questions or concerns before your next appointment please send [place] a message through [place] or call our office at [phone].    TO LEAVE A  MESSAGE FOR THE NURSE SELECT OPTION 2, PLEASE LEAVE A MESSAGE INCLUDING: YOUR NAME DATE OF BIRTH CALL BACK NUMBER REASON FOR CALL**this is important as we prioritize the call backs  YOU WILL RECEIVE A CALL BACK THE SAME DAY AS LONG AS YOU CALL BEFORE 4:00 PM

## 2023-05-23 NOTE — Addendum Note (Signed)
Encounter addended by: Jacklynn Ganong, FNP on: 05/23/2023 4:57 PM  Actions taken: Clinical Note Signed

## 2023-05-24 ENCOUNTER — Ambulatory Visit (INDEPENDENT_AMBULATORY_CARE_PROVIDER_SITE_OTHER): Payer: 59

## 2023-05-24 DIAGNOSIS — I440 Atrioventricular block, first degree: Secondary | ICD-10-CM

## 2023-05-25 LAB — CUP PACEART REMOTE DEVICE CHECK
Battery Remaining Longevity: 156 mo
Battery Remaining Percentage: 100 %
Brady Statistic RV Percent Paced: 0 %
Date Time Interrogation Session: 20240710033900
HighPow Impedance: 76 Ohm
Implantable Lead Connection Status: 753985
Implantable Lead Implant Date: 20230411
Implantable Lead Location: 753860
Implantable Lead Model: 138
Implantable Lead Serial Number: 305338
Implantable Pulse Generator Implant Date: 20230411
Lead Channel Impedance Value: 465 Ohm
Lead Channel Setting Pacing Amplitude: 3 V
Lead Channel Setting Pacing Pulse Width: 0.8 ms
Lead Channel Setting Sensing Sensitivity: 0.6 mV
Pulse Gen Serial Number: 216862

## 2023-06-06 ENCOUNTER — Encounter: Payer: 59 | Admitting: Internal Medicine

## 2023-06-14 NOTE — Progress Notes (Signed)
Remote ICD transmission.   

## 2023-06-22 NOTE — Progress Notes (Signed)
Electrophysiology Office Note:   Date:  06/23/2023  ID:  Bradley Powell, DOB August 09, 1981, MRN [id number]  Primary Cardiologist: None Electrophysiologist: Lewayne Bunting, MD      History of Present Illness:   Bradley Powell is a 42 y.o. male with h/o infiltrative cardiomyopathy, WCT, chronic combined systolic / diastolic CHF, HTN, obesity seen for routine electrophysiology followup.   Last seen in EP Clinic 05/2022 by Dr. Ladona Ridgel and was post ICD placement (02/2022) after hospitalization for VT, volume overload. He had noted increased peripheral edema and reported labs showed renal insufficiency.   Last remote device check 05/2023 showed appropriate histograms, leads / battery stale for patient per Dr. Ladona Ridgel.   He was recently seen by Heart Failure for medical clearance for an EGD (pending 8/27).   Since last being seen in our clinic the patient reports doing well.  No shocks on device. 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   He denies chest pain, palpitations, dyspnea, PND, orthopnea, nausea, vomiting, dizziness, syncope, edema, weight gain, or early satiety.   Review of systems complete and found to be negative unless listed in HPI.   EP Information / Studies Reviewed:        ICD Interrogation-  reviewed in detail today,  See PACEART report.  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 for NICM History of appropriate therapy: No History of AAD therapy: Yes; currently on amiodarone, mexiletine  Studies: LHC 12/2019 > normal coronary arteries, mild PAH with normal left-sided filling pressures and CO NM Cardiac Amyloid 02/2021 > visual & quantitative assessment are equivocal for transthyretin amyloidosis  Cardiac MRI 09/2021 > ECV markedly elevated (43%), which is in the amyloid range, LV morphology does not suggest amyloid, LGE pattern suggests cardiac sarcoidosis, diffuse LGE, normal LV size  with mild systolic dysfunction (EF 43%), mild RV dilatation, small pericardial effusion  ECHO 02/2022 > LVEF 35-40%, LV moderately decreased function, LV diastolic function could not be evaluated, RV systolic function moderately reduced, RV moderately enlarged, mildly elevated PA systolic, LA moderately dilated, RA mildly dilated, pericardial effusion is posterior to the LV, mild -mod MVR, moderate TVR  Arrhythmia / AAD  AFL s/p ablation 03/2018          Physical Exam:   VS:  BP 96/70   Pulse 77   Ht 5\' 8"  (1.727 m)   Wt 271 lb 3.2 oz (123 kg)   SpO2 98%   BMI 41.24 kg/m    Wt Readings from Last 3 Encounters:  06/23/23 271 lb 3.2 oz (123 kg)  05/23/23 268 lb (121.6 kg)  04/18/23 265 lb 3.2 oz (120.3 kg)     GEN: Well nourished, well developed in no acute distress NECK: No JVD; No carotid bruits CARDIAC: Regular rate and rhythm, no murmurs, rubs, gallops RESPIRATORY:  Clear to auscultation without rales, wheezing or rhonchi  ABDOMEN: Soft, non-tender, non-distended EXTREMITIES:  No edema; No deformity   ASSESSMENT AND PLAN:    Chronic Systolic Dysfunction s/p [employer] single chamber ICD  Infiltrative Cardiomyopathy (genetic testing + for LMNA) -euvolemic today -Stable on an appropriate medical regimen -Normal ICD function  -See Pace Art report -No changes today  Ventricular Tachycardia  -continue amiodarone + Mexitil   Atrial Flutter  -continue eliquis, dose appropriate by age & wt  Obesity  -slow gradual weight loss encouraged > pt is swimming   Surgical Clearance for EGD  -cleared by  HF  -ok from device standpoint to proceed with EGD, not dependent    Disposition:   Follow up with Dr. Ladona Ridgel in 12 months   Signed, Canary Brim, MSN, APRN, NP-C, AGACNP-BC [facility] - Electrophysiology  06/23/2023, 9:59 AM

## 2023-06-23 ENCOUNTER — Encounter: Payer: Self-pay | Admitting: Student

## 2023-06-23 ENCOUNTER — Ambulatory Visit: Payer: 59 | Attending: Internal Medicine | Admitting: Pulmonary Disease

## 2023-06-23 VITALS — BP 96/70 | HR 77 | Ht 68.0 in | Wt 271.2 lb

## 2023-06-23 DIAGNOSIS — I472 Ventricular tachycardia, unspecified: Secondary | ICD-10-CM

## 2023-06-23 DIAGNOSIS — I5022 Chronic systolic (congestive) heart failure: Secondary | ICD-10-CM | POA: Diagnosis not present

## 2023-06-23 LAB — CUP PACEART INCLINIC DEVICE CHECK
Date Time Interrogation Session: 20240809130322
HighPow Impedance: 62 Ohm
Implantable Lead Connection Status: 753985
Implantable Lead Implant Date: 20230411
Implantable Lead Location: 753860
Implantable Lead Model: 138
Implantable Lead Serial Number: 305338
Implantable Pulse Generator Implant Date: 20230411
Lead Channel Impedance Value: 472 Ohm
Lead Channel Pacing Threshold Amplitude: 1.6 V
Lead Channel Pacing Threshold Pulse Width: 0.8 ms
Lead Channel Sensing Intrinsic Amplitude: 5.9 mV
Lead Channel Setting Pacing Amplitude: 3 V
Lead Channel Setting Pacing Pulse Width: 0.8 ms
Lead Channel Setting Sensing Sensitivity: 0.6 mV
Pulse Gen Serial Number: 216862

## 2023-06-23 NOTE — Patient Instructions (Signed)
Medication Instructions:  Your physician recommends that you continue on your current medications as directed. Please refer to the Current Medication list given to you today.  *If you need a refill on your cardiac medications before your next appointment, please call your pharmacy*  Lab Work: None ordered If you have labs (blood work) drawn today and your tests are completely normal, you will receive your results only by: MyChart Message (if you have MyChart) OR A paper copy in the mail If you have any lab test that is abnormal or we need to change your treatment, we will call you to review the results.  Follow-Up: At [facility], you and your health needs are our priority.  As part of our continuing mission to provide you with exceptional heart care, we have created designated Provider Care Teams.  These Care Teams include your primary Cardiologist (physician) and Advanced Practice Providers (APPs -  Physician Assistants and Nurse Practitioners) who all work together to provide you with the care you need, when you need it.  Your next appointment:   1 year(s)  Provider:   Gregg Taylor, MD  

## 2023-07-04 ENCOUNTER — Other Ambulatory Visit (HOSPITAL_COMMUNITY): Payer: Self-pay | Admitting: Internal Medicine

## 2023-07-04 ENCOUNTER — Encounter (HOSPITAL_COMMUNITY): Payer: Self-pay | Admitting: Gastroenterology

## 2023-07-07 ENCOUNTER — Other Ambulatory Visit (HOSPITAL_COMMUNITY): Payer: Self-pay | Admitting: Cardiology

## 2023-07-07 MED ORDER — SPIRONOLACTONE 25 MG PO TABS: 25.0000 mg | ORAL_TABLET | Freq: Every day | ORAL | 3 refills | Status: DC

## 2023-07-10 ENCOUNTER — Other Ambulatory Visit: Payer: Self-pay | Admitting: Internal Medicine

## 2023-07-11 ENCOUNTER — Ambulatory Visit (HOSPITAL_COMMUNITY): Payer: 59 | Admitting: Certified Registered Nurse Anesthetist

## 2023-07-11 ENCOUNTER — Ambulatory Visit (HOSPITAL_COMMUNITY)
Admission: RE | Admit: 2023-07-11 | Discharge: 2023-07-11 | Disposition: A | Discharge status: Home / Self Care | Payer: 59 | Attending: Gastroenterology | Admitting: Gastroenterology

## 2023-07-11 ENCOUNTER — Encounter (HOSPITAL_COMMUNITY)
Admission: RE | Disposition: A | Discharge status: Home / Self Care | Payer: Self-pay | Source: Home / Self Care | Attending: Gastroenterology

## 2023-07-11 ENCOUNTER — Other Ambulatory Visit: Payer: Self-pay

## 2023-07-11 ENCOUNTER — Ambulatory Visit (HOSPITAL_BASED_OUTPATIENT_CLINIC_OR_DEPARTMENT_OTHER): Payer: 59 | Admitting: Certified Registered Nurse Anesthetist

## 2023-07-11 ENCOUNTER — Encounter (HOSPITAL_COMMUNITY): Payer: Self-pay | Admitting: Gastroenterology

## 2023-07-11 DIAGNOSIS — I480 Paroxysmal atrial fibrillation: Secondary | ICD-10-CM | POA: Diagnosis not present

## 2023-07-11 DIAGNOSIS — K317 Polyp of stomach and duodenum: Secondary | ICD-10-CM | POA: Insufficient documentation

## 2023-07-11 DIAGNOSIS — I5042 Chronic combined systolic (congestive) and diastolic (congestive) heart failure: Secondary | ICD-10-CM

## 2023-07-11 DIAGNOSIS — K259 Gastric ulcer, unspecified as acute or chronic, without hemorrhage or perforation: Secondary | ICD-10-CM | POA: Diagnosis not present

## 2023-07-11 DIAGNOSIS — Z7901 Long term (current) use of anticoagulants: Secondary | ICD-10-CM | POA: Diagnosis not present

## 2023-07-11 DIAGNOSIS — I8510 Secondary esophageal varices without bleeding: Secondary | ICD-10-CM | POA: Diagnosis not present

## 2023-07-11 DIAGNOSIS — K7460 Unspecified cirrhosis of liver: Secondary | ICD-10-CM | POA: Diagnosis not present

## 2023-07-11 DIAGNOSIS — I110 Hypertensive heart disease with heart failure: Secondary | ICD-10-CM | POA: Diagnosis not present

## 2023-07-11 DIAGNOSIS — K2950 Unspecified chronic gastritis without bleeding: Secondary | ICD-10-CM | POA: Diagnosis not present

## 2023-07-11 DIAGNOSIS — I429 Cardiomyopathy, unspecified: Secondary | ICD-10-CM | POA: Diagnosis not present

## 2023-07-11 DIAGNOSIS — Z09 Encounter for follow-up examination after completed treatment for conditions other than malignant neoplasm: Secondary | ICD-10-CM | POA: Diagnosis not present

## 2023-07-11 DIAGNOSIS — Z8619 Personal history of other infectious and parasitic diseases: Secondary | ICD-10-CM | POA: Diagnosis not present

## 2023-07-11 DIAGNOSIS — K3189 Other diseases of stomach and duodenum: Secondary | ICD-10-CM | POA: Insufficient documentation

## 2023-07-11 DIAGNOSIS — I8500 Esophageal varices without bleeding: Secondary | ICD-10-CM | POA: Diagnosis not present

## 2023-07-11 DIAGNOSIS — R112 Nausea with vomiting, unspecified: Secondary | ICD-10-CM | POA: Diagnosis not present

## 2023-07-11 HISTORY — PX: ESOPHAGOGASTRODUODENOSCOPY (EGD) WITH PROPOFOL: SHX5813

## 2023-07-11 HISTORY — PX: BIOPSY: SHX5522

## 2023-07-11 SURGERY — ESOPHAGOGASTRODUODENOSCOPY (EGD) WITH PROPOFOL
Anesthesia: General

## 2023-07-11 MED ORDER — SODIUM CHLORIDE 0.9 % IV SOLN: INTRAVENOUS | Status: DC

## 2023-07-11 MED ORDER — MIDAZOLAM HCL 2 MG/2ML IJ SOLN
INTRAMUSCULAR | Status: AC
  Filled 2023-07-11: qty 2

## 2023-07-11 MED ORDER — ETOMIDATE 2 MG/ML IV SOLN
INTRAVENOUS | Status: AC
  Filled 2023-07-11: qty 10

## 2023-07-11 MED ORDER — FENTANYL CITRATE (PF) 100 MCG/2ML IJ SOLN
INTRAMUSCULAR | Status: DC | PRN
  Administered 2023-07-11: 25 ug via INTRAVENOUS

## 2023-07-11 MED ORDER — DEXAMETHASONE SODIUM PHOSPHATE 4 MG/ML IJ SOLN
INTRAMUSCULAR | Status: DC | PRN
  Administered 2023-07-11: 4 mg via INTRAVENOUS

## 2023-07-11 MED ORDER — ETOMIDATE 2 MG/ML IV SOLN
INTRAVENOUS | Status: DC | PRN
Start: 2023-07-11 — End: 2023-07-11
  Administered 2023-07-11: 20 mg via INTRAVENOUS

## 2023-07-11 MED ORDER — ONDANSETRON HCL 4 MG/2ML IJ SOLN
INTRAMUSCULAR | Status: DC | PRN
  Administered 2023-07-11: 4 mg via INTRAVENOUS

## 2023-07-11 MED ORDER — FENTANYL CITRATE (PF) 100 MCG/2ML IJ SOLN
INTRAMUSCULAR | Status: AC
  Filled 2023-07-11: qty 2

## 2023-07-11 MED ORDER — SUCCINYLCHOLINE CHLORIDE 200 MG/10ML IV SOSY
PREFILLED_SYRINGE | INTRAVENOUS | Status: DC | PRN
  Administered 2023-07-11: 140 mg via INTRAVENOUS

## 2023-07-11 MED ORDER — LIDOCAINE 2% (20 MG/ML) 5 ML SYRINGE
INTRAMUSCULAR | Status: DC | PRN
  Administered 2023-07-11: 40 mg via INTRAVENOUS

## 2023-07-11 MED ORDER — LACTATED RINGERS IV SOLN: INTRAVENOUS | Status: DC

## 2023-07-11 MED ORDER — MIDAZOLAM HCL 2 MG/2ML IJ SOLN
INTRAMUSCULAR | Status: DC | PRN
  Administered 2023-07-11: 1 mg via INTRAVENOUS

## 2023-07-11 SURGICAL SUPPLY — 15 items

## 2023-07-11 NOTE — Anesthesia Preprocedure Evaluation (Addendum)
Anesthesia Evaluation  Patient identified by MRN, date of birth, ID band Patient awake    Reviewed: Allergy & Precautions, NPO status , Patient's Chart, lab work & pertinent test results  History of Anesthesia Complications Negative for: history of anesthetic complications  Airway Mallampati: II  TM Distance: >3 FB Neck ROM: Full    Dental  (+) Dental Advisory Given   Pulmonary sleep apnea and Continuous Positive Airway Pressure Ventilation    breath sounds clear to auscultation       Cardiovascular hypertension, Pt. on medications (-) angina +CHF  + dysrhythmias Atrial Fibrillation + Cardiac Defibrillator  Rhythm:Regular Rate:Normal  '23 ECHO: EF 35 to 40%.  1. LV EF 35 %. The left ventricle has moderately decreased function, global  hypokinesis. Left ventricular diastolic function could not be evaluated.   2. RVF is moderately reduced. The right ventricular size is moderately enlarged. There is mildly elevated pulmonary artery systolic pressure.   3. Left atrial size was moderately dilated.   4. Right atrial size was mildly dilated.   5. The pericardial effusion is posterior to the left ventricle.   6. The mitral valve is normal in structure. Mild to moderate MR.   7. Tricuspid valve regurgitation is moderate.   8. The aortic valve is tricuspid. There is mild thickening of the aortic valve. Aortic valve regurgitation is not visualized. Aortic valve sclerosis is present, with no evidence of aortic valve stenosis.     Neuro/Psych negative neurological ROS     GI/Hepatic Neg liver ROS,GERD  Medicated,,N/V   Endo/Other  BMI 39  Renal/GU Renal InsufficiencyRenal disease     Musculoskeletal   Abdominal   Peds  Hematology eliquis   Anesthesia Other Findings   Reproductive/Obstetrics                              Anesthesia Physical Anesthesia Plan  ASA: 4  Anesthesia Plan: General    Post-op Pain Management: Minimal or no pain anticipated   Induction: Intravenous  PONV Risk Score and Plan: 2  Airway Management Planned: Oral ETT  Additional Equipment: None  Intra-op Plan:   Post-operative Plan:   Informed Consent: I have reviewed the patients History and Physical, chart, labs and discussed the procedure including the risks, benefits and alternatives for the proposed anesthesia with the patient or authorized representative who has indicated his/her understanding and acceptance.     Dental advisory given  Plan Discussed with: CRNA and Surgeon  Anesthesia Plan Comments:         Anesthesia Quick Evaluation

## 2023-07-11 NOTE — H&P (Signed)
Primary Care Physician:  [name], MD Primary Gastroenterologist:  Dr. Levora Angel  Reason for Visit -outpatient EGD for evaluation of nausea, vomiting and follow-up on gastric ulcers.  HPI: Bradley Powell is a 42 y.o. male here for outpatient EGD.  Past medical history of cirrhosis as well as combined systolic and diastolic heart failure, cardiomyopathy with concern for amyloid deposits, followed by cardiology. They are considering enrollment for cardiomyopathy study through [facility]. Cardiology managing diuretics.  He s/p defibrillator placement in April 2023.  Patient with negative workup in the past including ultrasound, gastric emptying scan, HIDA scan and MRI MRCP.  EGD in the past showed gastritis, gastric ulcer and Candida esophagitis which was treated.  His nausea and vomiting is currently well-controlled with esomeprazole 40 mg twice a day. He usually gets symptoms of nausea and vomiting when he runs out of his medications.  Denies any blood in the stool or black stool. Denies diarrhea or constipation.  Denies reflux, trouble swallowing or pain while swallowing.  Upper GI small bowel series in August 2023 showed generalized small bowel edema from ascites but there was no acute changes.  CT scan was recommended if clinically indicated but it was declined by [employer].  Previous history  Patient was admitted to the hospital from August 2 to August 8 for evaluation of nausea and vomiting, hypotension and acute kidney injury with creatinine up to 4.5.  EGD on June 19, 2020 showed gastritis and tiny gastric ulcer. Scattered white plaques in the esophagus. Cytology was negative for malignancy but was positive for Candida. He was treated with fluconazole as an outpatient. Patient is currently taking pantoprazole twice a day and Carafate twice a day with some improvement in symptoms.  Ultrasound right upper quadrant January 2022 was negative for acute changes.  Normal  gastric emptying scan in November 2021.  For abnormal LFTs  Liver biopsy in November 2021 showed features of chronic venous out from obstruction from congestive hepatopathy. Also showed advanced fibrosis stage 3 of 4.  MRI MRCP in April 2021 for evaluation of nausea, vomiting and jaundice showed finding concerning for cirrhosis without any CBD obstruction.  Ultrasound in August 2021 showed fatty liver and gallbladder sludge.  Hepatitis panel negative. Normal AMA and ASMA. Normal alpha one antitrypsin phenotype. Normal ferritin and ceruloplasmin.  Very occasional alcohol use.Denies recreational drug use. Denies marijuana use  Past Medical History:  Diagnosis Date   Atrial flutter (HCC)    a. s/p ablation 03/2018.   Chronic combined systolic and diastolic CHF (congestive heart failure) (HCC)    History of esophagogastroduodenoscopy (EGD)    Morbid obesity (HCC)    NICM (nonischemic cardiomyopathy) (HCC)    PVC's (premature ventricular contractions)    Sleep apnea     Past Surgical History:  Procedure Laterality Date   A-FLUTTER ABLATION N/A 04/06/2018   Procedure: A-FLUTTER ABLATION;  Surgeon: Marinus Maw, MD;  Location: [facility] INVASIVE CV LAB;  Service: Cardiovascular;  Laterality: N/A;   ESOPHAGEAL BRUSHING  06/19/2020   Procedure: ESOPHAGEAL BRUSHING;  Surgeon: Charlott Rakes, MD;  Location: [facility] ENDOSCOPY;  Service: Endoscopy;;   ESOPHAGOGASTRODUODENOSCOPY (EGD) WITH PROPOFOL Left 06/19/2020   Procedure: ESOPHAGOGASTRODUODENOSCOPY (EGD) WITH PROPOFOL;  Surgeon: Charlott Rakes, MD;  Location: [facility] ENDOSCOPY;  Service: Endoscopy;  Laterality: Left;   ICD IMPLANT N/A 02/22/2022   Procedure: ICD IMPLANT;  Surgeon: Marinus Maw, MD;  Location: [facility] INVASIVE CV LAB;  Service: Cardiovascular;  Laterality: N/A;   RIGHT HEART CATH N/A 12/20/2019  Procedure: RIGHT HEART CATH;  Surgeon: Dolores Patty, MD;  Location: [facility] INVASIVE CV LAB;  Service: Cardiovascular;  Laterality: N/A;    RIGHT/LEFT HEART CATH AND CORONARY ANGIOGRAPHY N/A 03/01/2017   Procedure: Right/Left Heart Cath and Coronary Angiography;  Surgeon: Dolores Patty, MD;  Location: [facility] INVASIVE CV LAB;  Service: Cardiovascular;  Laterality: N/A;    Prior to Admission medications   Medication Sig Start Date End Date Taking? Authorizing Provider  allopurinol (ZYLOPRIM) 100 MG tablet Take 2 tablets (200 mg total) by mouth daily. Please contact PCP for further refills 04/10/19   Bensimhon, Bevelyn Buckles, MD  amiodarone (PACERONE) 200 MG tablet Take 1 tablet (200 mg total) by mouth daily. 04/18/23   Milford, Anderson Malta, FNP  apixaban (ELIQUIS) 5 MG TABS tablet Take 1 tablet (5 mg total) by mouth 2 (two) times daily. 07/22/22   Bensimhon, Bevelyn Buckles, MD  esomeprazole (NEXIUM) 40 MG capsule Take 40 mg by mouth 2 (two) times daily. 01/08/22   [provider]  JARDIANCE 10 MG TABS tablet TAKE 1 TABLET BY MOUTH EVERY DAY BEFORE BREAKFAST 03/31/23   Bensimhon, Bevelyn Buckles, MD  levothyroxine (SYNTHROID) 50 MCG tablet Take 1 tablet (50 mcg total) by mouth daily before breakfast. Take 1 hour before breakfast 04/18/23   [place]ford, Anderson Malta, FNP  metolazone (ZAROXOLYN) 5 MG tablet Take 1 tablet (5 mg total) by mouth once a week. 04/18/23 07/17/23  Jacklynn Ganong, FNP  mexiletine (MEXITIL) 150 MG capsule TAKE 1 CAPSULE (150 MG TOTAL) BY MOUTH EVERY 12 (TWELVE) HOURS. 05/09/23   Milford, Anderson Malta, FNP  MITIGARE 0.6 MG CAPS Take 0.6 mg by mouth daily as needed (gout flare). 04/10/19   Bensimhon, Bevelyn Buckles, MD  potassium chloride SA (KLOR-CON M) 20 MEQ tablet PLEASE TAKE POTASSIUM (4 TABLETS) IN THE MORNING AND (3 TABLETS) IN THE EVENINGS 05/23/23   Jacklynn Ganong, FNP  spironolactone (ALDACTONE) 25 MG tablet Take 1 tablet (25 mg total) by mouth daily. 07/07/23 10/05/23  Bensimhon, Bevelyn Buckles, MD  sucralfate (CARAFATE) 1 g tablet Take 1 g by mouth daily.  09/03/20   [provider]  torsemide (DEMADEX) 20 MG tablet Take 3  tablets (60 mg total) by mouth 2 (two) times daily. 05/23/23   Jacklynn Ganong, FNP    Scheduled Meds: Continuous Infusions: PRN Meds:.  Allergies as of 03/09/2023 - Review Complete 12/16/2022  Allergen Reaction Noted   Entresto [sacubitril-valsartan] Nausea And Vomiting and Other (See Comments) 04/02/2018    Family History  Problem Relation Age of Onset   CVA Mother        pacemaker   Multiple sclerosis Mother    Seizures Mother    Hypertension Father    Gout Father     Social History   Socioeconomic History   Marital status: Single    Spouse name: Not on file   Number of children: Not on file   Years of education: Not on file   Highest education level: Not on file  Occupational History   Not on file  Tobacco Use   Smoking status: Never   Smokeless tobacco: Never  Vaping Use   Vaping status: Never Used  Substance and Sexual Activity   Alcohol use: Yes    Comment: rarely   Drug use: No   Sexual activity: Not Currently    Partners: Female  Other Topics Concern   Not on file  Social History Narrative   Not on file  Social Determinants of Health   Financial Resource Strain: Not on file  Food Insecurity: Not on file  Transportation Needs: Not on file  Physical Activity: Not on file  Stress: Not on file  Social Connections: Not on file  Intimate Partner Violence: Not on file    Review of Systems: Physical Exam: Vital signs: There were no vitals filed for this visit.   General:   Alert,  Well-developed, well-nourished, pleasant and cooperative in NAD Lungs: No visible respiratory distress Heart:  Regular rate and rhythm; no murmurs, clicks, rubs,  or gallops. Abdomen: Abdomen is mildly distended but otherwise soft, nontender, bowel sounds present, no peritoneal signs Rectal:  Deferred  GI:  Lab Results: No results for input(s): "WBC", "HGB", "HCT", "PLT" in the last 72 hours. BMET No results for input(s): "NA", "K", "CL", "CO2", "GLUCOSE", "BUN",  "CREATININE", "CALCIUM" in the last 72 hours. LFT No results for input(s): "PROT", "ALBUMIN", "AST", "ALT", "ALKPHOS", "BILITOT", "BILIDIR", "IBILI" in the last 72 hours. PT/INR No results for input(s): "LABPROT", "INR" in the last 72 hours.   Studies/Results: No results found.  Impression/Plan: -Recurrent nausea and vomiting.  Likely multifactorial. -History of gastric ulcer and Candida esophagitis -Cardiomyopathy.  Eliquis on hold for 2 days now. -Cirrhosis.  Recommendations ------------------------- -Proceed with EGD today.  Risks (bleeding, infection, bowel perforation that could require surgery, sedation-related changes in cardiopulmonary systems), benefits (identification and possible treatment of source of symptoms, exclusion of certain causes of symptoms), and alternatives (watchful waiting, radiographic imaging studies, empiric medical treatment)  were explained to patient/family in detail and patient wishes to proceed.     LOS: 0 days   Kathi Der  MD, FACP 07/11/2023, 8:25 AM  Contact #  [phone]

## 2023-07-11 NOTE — Anesthesia Procedure Notes (Signed)
Procedure Name: Intubation Date/Time: 07/11/2023 9:23 AM  Performed by: Ludwig Lean, CRNAPre-anesthesia Checklist: Patient identified, Emergency Drugs available, Suction available and Patient being monitored Patient Re-evaluated:Patient Re-evaluated prior to induction Oxygen Delivery Method: Circle system utilized Preoxygenation: Pre-oxygenation with 100% oxygen Induction Type: IV induction, Rapid sequence and Cricoid Pressure applied Laryngoscope Size: Mac and 4 Grade View: Grade I Tube type: Oral Tube size: 7.5 mm Number of attempts: 1 Airway Equipment and Method: Stylet Placement Confirmation: ETT inserted through vocal cords under direct vision, positive ETCO2 and breath sounds checked- equal and bilateral Secured at: 22 cm Tube secured with: Tape Dental Injury: Teeth and Oropharynx as per pre-operative assessment

## 2023-07-11 NOTE — Transfer of Care (Signed)
Immediate Anesthesia Transfer of Care Note  Patient: Bradley Powell  Procedure(s) Performed: Procedure(s): ESOPHAGOGASTRODUODENOSCOPY (EGD) WITH PROPOFOL (N/A) BIOPSY  Patient Location: PACU  Anesthesia Type:General  Level of Consciousness: Patient easily awoken, sedated, comfortable, cooperative, following commands, responds to stimulation.   Airway & Oxygen Therapy: Patient spontaneously breathing, ventilating well, oxygen via simple oxygen mask.  Post-op Assessment: Report given to PACU RN, vital signs reviewed and stable, moving all extremities.   Post vital signs: Reviewed and stable.  Complications: No apparent anesthesia complications  Last Vitals:  Vitals Value Taken Time  BP 137/80 07/11/23 0958  Temp    Pulse 85 07/11/23 1001  Resp 16 07/11/23 1001  SpO2 100 % 07/11/23 1001  Vitals shown include unfiled device data.  Last Pain:  Vitals:   07/11/23 0848  TempSrc: Temporal  PainSc: 0-No pain         Complications: No notable events documented.

## 2023-07-11 NOTE — Discharge Instructions (Signed)

## 2023-07-11 NOTE — Anesthesia Postprocedure Evaluation (Signed)
Anesthesia Post Note  Patient: Bradley Powell  Procedure(s) Performed: ESOPHAGOGASTRODUODENOSCOPY (EGD) WITH PROPOFOL BIOPSY     Patient location during evaluation: Endoscopy Anesthesia Type: General Level of consciousness: awake and alert, patient cooperative and oriented Pain management: pain level controlled Vital Signs Assessment: post-procedure vital signs reviewed and stable Respiratory status: spontaneous breathing, nonlabored ventilation and respiratory function stable Cardiovascular status: blood pressure returned to baseline and stable Postop Assessment: no apparent nausea or vomiting and able to ambulate Anesthetic complications: no   No notable events documented.  Last Vitals:  Vitals:   07/11/23 1020 07/11/23 1025  BP: 125/74   Pulse: 73 78  Resp: (!) 23 (!) 21  Temp:    SpO2: 97% 99%    Last Pain:  Vitals:   07/11/23 1004  TempSrc: Temporal  PainSc:                  Roiza Wiedel,E. Paula Zietz

## 2023-07-11 NOTE — Op Note (Signed)
[facility] Patient Name: Rudolfo Powell Procedure Date: 07/11/2023 MRN: [id number] Attending MD: Bradley Powell , MD, [id number] Date of Birth: [birth year] CSN: [id number] Age: 42 Admit Type: Outpatient Procedure:                Upper GI endoscopy Indications:              Follow-up of gastric ulcer, Nausea with vomiting Providers:                Bradley Der, MD, Bradley Powell. [place], RN,                            Bradley "Jaci" Clelia Croft, RN, Bradley Powell,                            Technician Referring MD:             Bradley Der, MD Medicines:                Sedation Administered by an Anesthesia Professional Complications:            No immediate complications. Estimated Blood Loss:     Estimated blood loss was minimal. Procedure:                Pre-Anesthesia Assessment:                           - Prior to the procedure, a History and Physical                            was performed, and patient medications and                            allergies were reviewed. The patient's tolerance of                            previous anesthesia was also reviewed. The risks                            and benefits of the procedure and the sedation                            options and risks were discussed with the patient.                            All questions were answered, and informed consent                            was obtained. Prior Anticoagulants: The patient has                            taken Eliquis (apixaban), last dose was 2 days                            prior to procedure. ASA Grade Assessment: IV - A  patient with severe systemic disease that is a                            constant threat to life. After reviewing the risks                            and benefits, the patient was deemed in                            satisfactory condition to undergo the procedure.                           After obtaining  informed consent, the endoscope was                            passed under direct vision. Throughout the                            procedure, the patient's blood pressure, pulse, and                            oxygen saturations were monitored continuously. The                            GIF-H190 ([id number]) Olympus endoscope was introduced                            through the mouth, and advanced to the second part                            of duodenum. The upper GI endoscopy was                            accomplished without difficulty. The patient                            tolerated the procedure well. Scope In: Scope Out: Findings:      Small (< 5 mm) varices were found in the distal esophagus.      The Z-line was regular and was found 38 cm from the incisors.      Patchy moderate inflammation characterized by congestion (edema),       erosions, erythema and friability was found in the gastric antrum and in       the prepyloric region of the stomach. Biopsies were taken with a cold       forceps for histology.      Three 5 to 10 mm inflamatory apprearing pedunculated and sessile polyps       were found in the gastric antrum and in the prepyloric region of the       stomach. Biopsies were taken with a cold forceps for histology.      The cardia and gastric fundus were normal on retroflexion.      Scattered mild mucosal changes characterized by white specks were found       in the duodenal bulb and in the first portion  of the duodenum. Biopsies       were taken with a cold forceps for histology. Impression:               - Small (< 5 mm) esophageal varices.                           - Z-line regular, 38 cm from the incisors.                           - Chronic gastritis. Biopsied.                           - Three likely inflamatory gastric polyps. Biopsied.                           - Mucosal changes in the duodenum. Biopsied. Moderate Sedation:      Moderate (conscious)  sedation was personally administered by an       anesthesia professional. The following parameters were monitored: oxygen       saturation, heart rate, blood pressure, and response to care. Recommendation:           - Patient has a contact number available for                            emergencies. The signs and symptoms of potential                            delayed complications were discussed with the                            patient. Return to normal activities tomorrow.                            Written discharge instructions were provided to the                            patient.                           - Resume previous diet.                           - Continue present medications.                           - Await pathology results.                           - Return to my office as previously scheduled. Procedure Code(s):        --- Professional ---                           [phone], Esophagogastroduodenoscopy, flexible,                            transoral; with biopsy, single or multiple Diagnosis Code(s):        ---  Professional ---                           I85.00, Esophageal varices without bleeding                           K29.50, Unspecified chronic gastritis without                            bleeding                           K31.7, Polyp of stomach and duodenum                           K31.89, Other diseases of stomach and duodenum                           K25.9, Gastric ulcer, unspecified as acute or                            chronic, without hemorrhage or perforation                           R11.2, Nausea with vomiting, unspecified CPT copyright 2022 American Medical Association. All rights reserved. The codes documented in this report are preliminary and upon coder review may  be revised to meet current compliance requirements. Bradley Der, MD Bradley Der, MD 07/11/2023 9:53:58 AM Number of Addenda: 0

## 2023-07-12 ENCOUNTER — Encounter (HOSPITAL_COMMUNITY): Payer: Self-pay | Admitting: Gastroenterology

## 2023-07-12 ENCOUNTER — Other Ambulatory Visit: Payer: Self-pay | Admitting: Gastroenterology

## 2023-07-12 DIAGNOSIS — K7460 Unspecified cirrhosis of liver: Secondary | ICD-10-CM

## 2023-07-12 LAB — SURGICAL PATHOLOGY

## 2023-07-24 ENCOUNTER — Ambulatory Visit: Payer: 59 | Admitting: Cardiology

## 2023-07-25 ENCOUNTER — Encounter: Payer: Self-pay | Admitting: Cardiology

## 2023-07-25 ENCOUNTER — Telehealth: Payer: Self-pay | Admitting: *Deleted

## 2023-07-25 ENCOUNTER — Ambulatory Visit: Payer: 59 | Attending: Cardiology | Admitting: Cardiology

## 2023-07-25 VITALS — BP 110/78 | HR 76 | Ht 68.0 in | Wt 271.0 lb

## 2023-07-25 DIAGNOSIS — G4733 Obstructive sleep apnea (adult) (pediatric): Secondary | ICD-10-CM

## 2023-07-25 DIAGNOSIS — I5022 Chronic systolic (congestive) heart failure: Secondary | ICD-10-CM

## 2023-07-25 NOTE — Patient Instructions (Signed)
Medication Instructions:  Your physician recommends that you continue on your current medications as directed. Please refer to the Current Medication list given to you today.  *If you need a refill on your cardiac medications before your next appointment, please call your pharmacy*  Lab Work: If you have labs (blood work) drawn today and your tests are completely normal, you will receive your results only by: [place] (if you have MyChart) OR A paper copy in the mail If you have any lab test that is abnormal or we need to change your treatment, we will call you to review the results.   Testing/Procedures: None ordered today.   Follow-Up: At [facility], you and your health needs are our priority.  As part of our continuing mission to provide you with exceptional heart care, we have created designated Provider Care Teams.  These Care Teams include your primary Cardiologist (physician) and Advanced Practice Providers (APPs -  Physician Assistants and Nurse Practitioners) who all work together to provide you with the care you need, when you need it.  We recommend signing up for the patient portal called "MyChart".  Sign up information is provided on this After Visit Summary.  MyChart is used to connect with patients for Virtual Visits (Telemedicine).  Patients are able to view lab/test results, encounter notes, upcoming appointments, etc.  Non-urgent messages can be sent to your provider as well.   To learn more about what you can do with MyChart, go to [email or web address].    Your next appointment:   1 year(s)  Provider:   Dr. Radford Pax

## 2023-07-25 NOTE — Telephone Encounter (Signed)
Per Dr Mayford Knife, he does not like the DME he is dealing with so please find out which one it is (he did not know ) and refer to a new DME

## 2023-07-25 NOTE — Progress Notes (Signed)
SLEEP MEDICINE VIRTUAL VISIT  via Video Note   Because of Bradley Powell's co-morbid illnesses, he is at least at moderate risk for complications without adequate follow up.  This format is felt to be most appropriate for this patient at this time.  All issues noted in this document were discussed and addressed.  A limited physical exam was performed with this format.  Please refer to the patient's chart for his consent to telehealth for [facility].  Date:  07/25/2023   ID:  Bradley Powell, DOB Aug 09, 1981, MRN [id number] The patient was identified using 2 identifiers.  Patient Location: Home Provider Location: Home Office   PCP:  Bradley Dills, MD   [facility] Providers Cardiologist:  None Electrophysiologist:  Bradley Bunting, MD  Sleep Medicine:  Bradley Magic, MD     Evaluation Performed:  Follow-Up Visit  Chief Complaint:  OSA  History of Present Illness:    Bradley Powell is a 42 y.o. male with  a hx of paroxysmal atrial flutter s/p remote ablation, chronic combined systolic/diastolic CHF, NICM and morbid obesity. He was referred for initial sleep study in 2018 showing moderate OSA with an AHI of 26.7/hr, moderate snoring and intermittent heart block.  He underwent CPAP titration to 13cm H2O but never followed up. He was seen back by Dr. [name] and said that his machine was old and needed a new device.  A repeat sleep study was ordered showing mild OSA with an AHI of 7.7/hr and was placed on auto CPAP from 4 to 15cm H2O.   He is doing well with his PAP device  but has not been using it very much.  He has very erratic sleeping hours.  He has a lot of things that he is doing for work.  He goes to bed late and gets up early to go to school.   He tolerates the mask and feels the pressure is adequate.  Since going on PAP he feels rested in the am and has no significant daytime sleepiness.  He denies any significant mouth or nasal dryness or nasal  congestion.  He does not think that he snores.  He does not like the DME he is using right now and would like to be referred to another DME.     Past Medical History:  Diagnosis Date   Atrial flutter (HCC)    a. s/p ablation 03/2018.   Chronic combined systolic and diastolic CHF (congestive heart failure) (HCC)    History of esophagogastroduodenoscopy (EGD)    Morbid obesity (HCC)    NICM (nonischemic cardiomyopathy) (HCC)    OSA on CPAP    mild OSA with an AHI of 7.7/hr on auto CPAP   PVC's (premature ventricular contractions)    Past Surgical History:  Procedure Laterality Date   A-FLUTTER ABLATION N/A 04/06/2018   Procedure: A-FLUTTER ABLATION;  Surgeon: Marinus Maw, MD;  Location: [facility] INVASIVE CV LAB;  Service: Cardiovascular;  Laterality: N/A;   BIOPSY  07/11/2023   Procedure: BIOPSY;  Surgeon: Kathi Der, MD;  Location: [facility] ENDOSCOPY;  Service: Gastroenterology;;   ESOPHAGEAL BRUSHING  06/19/2020   Procedure: ESOPHAGEAL BRUSHING;  Surgeon: Charlott Rakes, MD;  Location: [facility] ENDOSCOPY;  Service: Endoscopy;;   ESOPHAGOGASTRODUODENOSCOPY (EGD) WITH PROPOFOL Left 06/19/2020   Procedure: ESOPHAGOGASTRODUODENOSCOPY (EGD) WITH PROPOFOL;  Surgeon: Charlott Rakes, MD;  Location: [facility] ENDOSCOPY;  Service: Endoscopy;  Laterality: Left;   ESOPHAGOGASTRODUODENOSCOPY (EGD) WITH PROPOFOL N/A 07/11/2023   Procedure:  ESOPHAGOGASTRODUODENOSCOPY (EGD) WITH PROPOFOL;  Surgeon: Kathi Der, MD;  Location: [facility] ENDOSCOPY;  Service: Gastroenterology;  Laterality: N/A;   ICD IMPLANT N/A 02/22/2022   Procedure: ICD IMPLANT;  Surgeon: Marinus Maw, MD;  Location: [facility] INVASIVE CV LAB;  Service: Cardiovascular;  Laterality: N/A;   RIGHT HEART CATH N/A 12/20/2019   Procedure: RIGHT HEART CATH;  Surgeon: Dolores Patty, MD;  Location: [facility] INVASIVE CV LAB;  Service: Cardiovascular;  Laterality: N/A;   RIGHT/LEFT HEART CATH AND CORONARY ANGIOGRAPHY N/A 03/01/2017   Procedure: Right/Left  Heart Cath and Coronary Angiography;  Surgeon: Dolores Patty, MD;  Location: [facility] INVASIVE CV LAB;  Service: Cardiovascular;  Laterality: N/A;     Current Meds  Medication Sig   allopurinol (ZYLOPRIM) 100 MG tablet Take 2 tablets (200 mg total) by mouth daily. Please contact PCP for further refills   amiodarone (PACERONE) 200 MG tablet Take 1 tablet (200 mg total) by mouth daily.   apixaban (ELIQUIS) 5 MG TABS tablet Take 1 tablet (5 mg total) by mouth 2 (two) times daily.   esomeprazole (NEXIUM) 40 MG capsule Take 40 mg by mouth 2 (two) times daily.   JARDIANCE 10 MG TABS tablet TAKE 1 TABLET BY MOUTH EVERY DAY BEFORE BREAKFAST   levothyroxine (SYNTHROID) 50 MCG tablet Take 1 tablet (50 mcg total) by mouth daily before breakfast. Take 1 hour before breakfast   mexiletine (MEXITIL) 150 MG capsule TAKE 1 CAPSULE (150 MG TOTAL) BY MOUTH EVERY 12 (TWELVE) HOURS.   MITIGARE 0.6 MG CAPS Take 0.6 mg by mouth daily as needed (gout flare).   potassium chloride SA (KLOR-CON M) 20 MEQ tablet PLEASE TAKE POTASSIUM (4 TABLETS) IN THE MORNING AND (3 TABLETS) IN THE EVENINGS   spironolactone (ALDACTONE) 25 MG tablet Take 1 tablet (25 mg total) by mouth daily.   sucralfate (CARAFATE) 1 g tablet Take 1 g by mouth daily.    torsemide (DEMADEX) 20 MG tablet Take 3 tablets (60 mg total) by mouth 2 (two) times daily.     Allergies:   Entresto [sacubitril-valsartan]   Social History   Tobacco Use   Smoking status: Never   Smokeless tobacco: Never  Vaping Use   Vaping status: Never Used  Substance Use Topics   Alcohol use: Yes    Comment: rarely   Drug use: No     Family Hx: The patient's family history includes CVA in his mother; Gout in his father; Hypertension in his father; Multiple sclerosis in his mother; Seizures in his mother.  ROS:   Please see the history of present illness.   All other systems reviewed and are negative.   Prior Sleep studies:   The following studies  were reviewed today:  PAP compliance download  Labs/Other Tests and Data Reviewed:     Recent Labs: 04/25/2023: Magnesium 1.8 05/23/2023: ALT 41; B Natriuretic Peptide 209.9; BUN 24; Creatinine, Ser 2.05; Potassium 3.6; Sodium 136; TSH 9.957   Wt Readings from Last 3 Encounters:  07/25/23 271 lb (122.9 kg)  07/11/23 258 lb (117 kg)  06/23/23 271 lb 3.2 oz (123 kg)     Risk Assessment/Calculations:      Objective:    Vital Signs:  BP 110/78   Pulse 76   Ht 5\' 8"  (1.727 m)   Wt 271 lb (122.9 kg)   SpO2 97%   BMI 41.21 kg/m   Well nourished, well developed male in no acute distress. Well appearing, alert and conversant, regular work  of breathing,  good skin color  Eyes- anicteric mouth- oral mucosa is pink  neuro- grossly intact skin- no apparent rash or lesions or cyanosis ASSESSMENT & PLAN:    OSA - The patient is tolerating PAP therapy well without any problems. The PAP download performed by his DME was personally reviewed and interpreted by me today and showed an AHI of 0.3/hr on auto CPAP from 4 to 15 cm H2O with 13% compliance in using more than 4 hours nightly.  The patient has been using and benefiting from PAP use and will continue to benefit from therapy.  -I encouraged him to be more compliant with his device. -I will get him a new DME    Total time of encounter: 15 minutes total time of encounter, including 15 minutes spent in face-to-face patient care on the date of this encounter. This time includes coordination of care and counseling regarding above mentioned problem list. Remainder of non-face-to-face time involved reviewing chart documents/testing relevant to the patient encounter and documentation in the medical record. I have independently reviewed documentation from referring provider.    Medication Adjustments/Labs and Tests Ordered: Current medicines are reviewed at length with the patient today.  Concerns regarding medicines are outlined above.    Tests Ordered: No orders of the defined types were placed in this encounter.   Medication Changes: No orders of the defined types were placed in this encounter.   Follow Up:  In Person in 1 year(s)  Signed, Bradley Magic, MD  07/25/2023 3:59 PM    [facility]

## 2023-08-04 ENCOUNTER — Other Ambulatory Visit: Payer: Self-pay | Admitting: Family Medicine

## 2023-08-04 ENCOUNTER — Other Ambulatory Visit (HOSPITAL_COMMUNITY): Payer: Self-pay

## 2023-08-04 NOTE — Telephone Encounter (Signed)
Hey can you do a PA for this patient please for this medication, he's been on it for awhile not sure why they need a PA now

## 2023-08-07 ENCOUNTER — Other Ambulatory Visit (HOSPITAL_COMMUNITY): Payer: Self-pay

## 2023-08-07 ENCOUNTER — Telehealth (HOSPITAL_COMMUNITY): Payer: Self-pay | Admitting: Pharmacy Technician

## 2023-08-07 NOTE — Telephone Encounter (Signed)
Advanced Heart Failure Patient Advocate Encounter  Prior Authorization for Mexiletine has been approved.    Effective dates: 08/04/23 through 08/03/24  Patients co-pay is $0 (30 days)  Archer Asa, CPhT

## 2023-08-09 ENCOUNTER — Other Ambulatory Visit (HOSPITAL_COMMUNITY): Payer: Self-pay

## 2023-08-09 MED ORDER — MEXILETINE HCL 150 MG PO CAPS
150.0000 mg | ORAL_CAPSULE | Freq: Two times a day (BID) | ORAL | 5 refills | Status: DC
  Filled 2024-03-01: qty 60, 30d supply, fill #0

## 2023-08-17 ENCOUNTER — Other Ambulatory Visit (HOSPITAL_COMMUNITY): Payer: Self-pay | Admitting: Internal Medicine

## 2023-08-23 ENCOUNTER — Ambulatory Visit (INDEPENDENT_AMBULATORY_CARE_PROVIDER_SITE_OTHER): Payer: 59

## 2023-08-23 DIAGNOSIS — I472 Ventricular tachycardia, unspecified: Secondary | ICD-10-CM

## 2023-08-23 LAB — CUP PACEART REMOTE DEVICE CHECK
Battery Remaining Longevity: 156 mo
Battery Remaining Percentage: 100 %
Brady Statistic RV Percent Paced: 0 %
Date Time Interrogation Session: 20241009012600
HighPow Impedance: 71 Ohm
Implantable Lead Connection Status: 753985
Implantable Lead Implant Date: 20230411
Implantable Lead Location: 753860
Implantable Lead Model: 138
Implantable Lead Serial Number: 305338
Implantable Pulse Generator Implant Date: 20230411
Lead Channel Impedance Value: 448 Ohm
Lead Channel Setting Pacing Amplitude: 3 V
Lead Channel Setting Pacing Pulse Width: 0.8 ms
Lead Channel Setting Sensing Sensitivity: 0.6 mV
Pulse Gen Serial Number: 216862

## 2023-08-24 ENCOUNTER — Other Ambulatory Visit (HOSPITAL_COMMUNITY): Payer: Self-pay | Admitting: Family Medicine

## 2023-09-01 ENCOUNTER — Ambulatory Visit
Admission: RE | Admit: 2023-09-01 | Discharge: 2023-09-01 | Disposition: A | Discharge status: Home / Self Care | Payer: 59 | Source: Ambulatory Visit | Attending: Gastroenterology | Admitting: Gastroenterology

## 2023-09-01 DIAGNOSIS — K769 Liver disease, unspecified: Secondary | ICD-10-CM | POA: Diagnosis not present

## 2023-09-01 DIAGNOSIS — K7460 Unspecified cirrhosis of liver: Secondary | ICD-10-CM

## 2023-09-01 DIAGNOSIS — K829 Disease of gallbladder, unspecified: Secondary | ICD-10-CM | POA: Diagnosis not present

## 2023-09-04 ENCOUNTER — Other Ambulatory Visit: Payer: 59

## 2023-09-11 NOTE — Progress Notes (Signed)
Remote ICD transmission.   

## 2023-09-26 ENCOUNTER — Telehealth (HOSPITAL_COMMUNITY): Payer: Self-pay | Admitting: Cardiology

## 2023-09-26 NOTE — Telephone Encounter (Signed)
Pt called to report BLE and mild SOB No weights to report Unable to wear compression stockings as they break into skin and cause pain   Pt has increased diuretic with no relief  Currently taking 80/40 torsemide Metolazone once a week 6-8 tabs of potassium daily  Reports he feels like he is headed to hospital admit with the amount of fluid build up  Add on 11/20 @ 830 Please advise further if needed

## 2023-09-26 NOTE — Telephone Encounter (Signed)
Pt aware Reports he will come to er on Friday once he is in town

## 2023-09-29 ENCOUNTER — Other Ambulatory Visit: Payer: Self-pay

## 2023-09-29 ENCOUNTER — Inpatient Hospital Stay (HOSPITAL_COMMUNITY)
Admission: EM | Admit: 2023-09-29 | Discharge: 2023-10-24 | DRG: 291 | Disposition: A | Discharge status: Home / Self Care | Payer: 59 | Attending: Internal Medicine | Admitting: Internal Medicine

## 2023-09-29 ENCOUNTER — Encounter (HOSPITAL_COMMUNITY): Payer: Self-pay

## 2023-09-29 ENCOUNTER — Emergency Department (HOSPITAL_COMMUNITY): Payer: 59

## 2023-09-29 DIAGNOSIS — I428 Other cardiomyopathies: Secondary | ICD-10-CM | POA: Diagnosis not present

## 2023-09-29 DIAGNOSIS — I4891 Unspecified atrial fibrillation: Secondary | ICD-10-CM | POA: Diagnosis present

## 2023-09-29 DIAGNOSIS — M7989 Other specified soft tissue disorders: Secondary | ICD-10-CM | POA: Diagnosis present

## 2023-09-29 DIAGNOSIS — I4892 Unspecified atrial flutter: Secondary | ICD-10-CM | POA: Diagnosis present

## 2023-09-29 DIAGNOSIS — Z79899 Other long term (current) drug therapy: Secondary | ICD-10-CM

## 2023-09-29 DIAGNOSIS — G4733 Obstructive sleep apnea (adult) (pediatric): Secondary | ICD-10-CM | POA: Diagnosis present

## 2023-09-29 DIAGNOSIS — Z1152 Encounter for screening for COVID-19: Secondary | ICD-10-CM

## 2023-09-29 DIAGNOSIS — E039 Hypothyroidism, unspecified: Secondary | ICD-10-CM | POA: Diagnosis present

## 2023-09-29 DIAGNOSIS — I509 Heart failure, unspecified: Secondary | ICD-10-CM

## 2023-09-29 DIAGNOSIS — R001 Bradycardia, unspecified: Secondary | ICD-10-CM | POA: Diagnosis present

## 2023-09-29 DIAGNOSIS — Z95810 Presence of automatic (implantable) cardiac defibrillator: Secondary | ICD-10-CM | POA: Diagnosis not present

## 2023-09-29 DIAGNOSIS — T502X5A Adverse effect of carbonic-anhydrase inhibitors, benzothiadiazides and other diuretics, initial encounter: Secondary | ICD-10-CM | POA: Diagnosis not present

## 2023-09-29 DIAGNOSIS — E8770 Fluid overload, unspecified: Secondary | ICD-10-CM | POA: Diagnosis not present

## 2023-09-29 DIAGNOSIS — N179 Acute kidney failure, unspecified: Secondary | ICD-10-CM | POA: Diagnosis not present

## 2023-09-29 DIAGNOSIS — R609 Edema, unspecified: Secondary | ICD-10-CM | POA: Diagnosis present

## 2023-09-29 DIAGNOSIS — D72829 Elevated white blood cell count, unspecified: Secondary | ICD-10-CM

## 2023-09-29 DIAGNOSIS — J189 Pneumonia, unspecified organism: Secondary | ICD-10-CM | POA: Diagnosis present

## 2023-09-29 DIAGNOSIS — R509 Fever, unspecified: Secondary | ICD-10-CM | POA: Diagnosis not present

## 2023-09-29 DIAGNOSIS — D631 Anemia in chronic kidney disease: Secondary | ICD-10-CM | POA: Diagnosis not present

## 2023-09-29 DIAGNOSIS — E611 Iron deficiency: Secondary | ICD-10-CM | POA: Diagnosis not present

## 2023-09-29 DIAGNOSIS — J811 Chronic pulmonary edema: Secondary | ICD-10-CM | POA: Diagnosis not present

## 2023-09-29 DIAGNOSIS — I130 Hypertensive heart and chronic kidney disease with heart failure and stage 1 through stage 4 chronic kidney disease, or unspecified chronic kidney disease: Principal | ICD-10-CM | POA: Diagnosis present

## 2023-09-29 DIAGNOSIS — D649 Anemia, unspecified: Secondary | ICD-10-CM | POA: Diagnosis not present

## 2023-09-29 DIAGNOSIS — I517 Cardiomegaly: Secondary | ICD-10-CM | POA: Diagnosis not present

## 2023-09-29 DIAGNOSIS — Z6841 Body Mass Index (BMI) 40.0 and over, adult: Secondary | ICD-10-CM

## 2023-09-29 DIAGNOSIS — Z8249 Family history of ischemic heart disease and other diseases of the circulatory system: Secondary | ICD-10-CM

## 2023-09-29 DIAGNOSIS — I5043 Acute on chronic combined systolic (congestive) and diastolic (congestive) heart failure: Secondary | ICD-10-CM | POA: Diagnosis not present

## 2023-09-29 DIAGNOSIS — Z888 Allergy status to other drugs, medicaments and biological substances status: Secondary | ICD-10-CM

## 2023-09-29 DIAGNOSIS — R519 Headache, unspecified: Secondary | ICD-10-CM | POA: Diagnosis not present

## 2023-09-29 DIAGNOSIS — I493 Ventricular premature depolarization: Secondary | ICD-10-CM | POA: Diagnosis present

## 2023-09-29 DIAGNOSIS — E871 Hypo-osmolality and hyponatremia: Secondary | ICD-10-CM | POA: Diagnosis present

## 2023-09-29 DIAGNOSIS — E66812 Obesity, class 2: Secondary | ICD-10-CM | POA: Diagnosis present

## 2023-09-29 DIAGNOSIS — I480 Paroxysmal atrial fibrillation: Secondary | ICD-10-CM | POA: Diagnosis present

## 2023-09-29 DIAGNOSIS — Z79890 Hormone replacement therapy (postmenopausal): Secondary | ICD-10-CM

## 2023-09-29 DIAGNOSIS — E876 Hypokalemia: Secondary | ICD-10-CM | POA: Diagnosis not present

## 2023-09-29 DIAGNOSIS — I5023 Acute on chronic systolic (congestive) heart failure: Secondary | ICD-10-CM | POA: Diagnosis present

## 2023-09-29 DIAGNOSIS — M170 Bilateral primary osteoarthritis of knee: Secondary | ICD-10-CM | POA: Diagnosis present

## 2023-09-29 DIAGNOSIS — N1832 Chronic kidney disease, stage 3b: Secondary | ICD-10-CM | POA: Diagnosis present

## 2023-09-29 DIAGNOSIS — I4821 Permanent atrial fibrillation: Secondary | ICD-10-CM | POA: Diagnosis present

## 2023-09-29 DIAGNOSIS — Z7984 Long term (current) use of oral hypoglycemic drugs: Secondary | ICD-10-CM

## 2023-09-29 DIAGNOSIS — N189 Chronic kidney disease, unspecified: Secondary | ICD-10-CM

## 2023-09-29 DIAGNOSIS — I472 Ventricular tachycardia, unspecified: Secondary | ICD-10-CM | POA: Diagnosis present

## 2023-09-29 DIAGNOSIS — M109 Gout, unspecified: Secondary | ICD-10-CM | POA: Diagnosis present

## 2023-09-29 DIAGNOSIS — K7460 Unspecified cirrhosis of liver: Secondary | ICD-10-CM | POA: Diagnosis present

## 2023-09-29 DIAGNOSIS — Z7901 Long term (current) use of anticoagulants: Secondary | ICD-10-CM

## 2023-09-29 DIAGNOSIS — I459 Conduction disorder, unspecified: Secondary | ICD-10-CM | POA: Diagnosis present

## 2023-09-29 DIAGNOSIS — E8809 Other disorders of plasma-protein metabolism, not elsewhere classified: Secondary | ICD-10-CM | POA: Diagnosis present

## 2023-09-29 DIAGNOSIS — I482 Chronic atrial fibrillation, unspecified: Secondary | ICD-10-CM | POA: Diagnosis present

## 2023-09-29 LAB — CBC
HCT: 44.3 % (ref 39.0–52.0)
Hemoglobin: 13.8 g/dL (ref 13.0–17.0)
MCH: 26.9 pg (ref 26.0–34.0)
MCHC: 31.2 g/dL (ref 30.0–36.0)
MCV: 86.4 fL (ref 80.0–100.0)
Platelets: 323 10*3/uL (ref 150–400)
RBC: 5.13 MIL/uL (ref 4.22–5.81)
RDW: 16.2 % — ABNORMAL HIGH (ref 11.5–15.5)
WBC: 9.2 10*3/uL (ref 4.0–10.5)
nRBC: 0 % (ref 0.0–0.2)

## 2023-09-29 LAB — BASIC METABOLIC PANEL
Anion gap: 8 (ref 5–15)
BUN: 34 mg/dL — ABNORMAL HIGH (ref 6–20)
CO2: 27 mmol/L (ref 22–32)
Calcium: 8.3 mg/dL — ABNORMAL LOW (ref 8.9–10.3)
Chloride: 99 mmol/L (ref 98–111)
Creatinine, Ser: 2.46 mg/dL — ABNORMAL HIGH (ref 0.61–1.24)
GFR, Estimated: 33 mL/min — ABNORMAL LOW (ref >60)
Glucose, Bld: 94 mg/dL (ref 70–99)
Potassium: 5.1 mmol/L (ref 3.5–5.1)
Sodium: 134 mmol/L — ABNORMAL LOW (ref 135–145)

## 2023-09-29 MED ORDER — FUROSEMIDE 10 MG/ML IJ SOLN
80.0000 mg | Freq: Once | INTRAMUSCULAR | Status: AC
  Administered 2023-09-29: 80 mg via INTRAVENOUS
  Filled 2023-09-29: qty 8

## 2023-09-29 NOTE — ED Triage Notes (Signed)
Pt endorses hx of CHF; having  bilateral LE edema x 2.5 weeks; takes torsemide at home, endorses compliance; endorses intermittent sob; denies pain currently; NAD in triage

## 2023-09-29 NOTE — ED Provider Notes (Signed)
[place] EMERGENCY DEPARTMENT AT [facility] Provider Note   CSN: [id number] Arrival date & time: 09/29/23  1048     History  No chief complaint on file.   Bradley Powell is a 42 y.o. male with history of CHF (LVEF 35 to 40%, echo 2023), s/p ICD and pacemaker, paroxysmal atrial flutter, hepatic cirrhosis, OSA on CPAP, who presents to the emergency department complaining of volume overload.  Patient states that he has been having some bilateral lower extremity edema for the past 2.5 weeks.  He normally takes 60 mg torsemide twice daily, and states he has been taking this as prescribed.  Also been having intermittent shortness of breath.  Not complaining of any pain.  HPI     Home Medications Prior to Admission medications   Medication Sig Start Date End Date Taking? Authorizing Provider  allopurinol (ZYLOPRIM) 100 MG tablet Take 2 tablets (200 mg total) by mouth daily. Please contact PCP for further refills 04/10/19   Bensimhon, Bevelyn Buckles, MD  amiodarone (PACERONE) 200 MG tablet Take 1 tablet (200 mg total) by mouth daily. 04/18/23   Milford, Anderson Malta, FNP  ELIQUIS 5 MG TABS tablet TAKE 1 TABLET BY MOUTH TWICE A DAY 08/17/23   Bensimhon, Bevelyn Buckles, MD  esomeprazole (NEXIUM) 40 MG capsule Take 40 mg by mouth 2 (two) times daily. 01/08/22   [provider]  JARDIANCE 10 MG TABS tablet TAKE 1 TABLET BY MOUTH EVERY DAY BEFORE BREAKFAST 03/31/23   Bensimhon, Bevelyn Buckles, MD  levothyroxine (SYNTHROID) 50 MCG tablet Take 1 tablet (50 mcg total) by mouth daily before breakfast. Take 1 hour before breakfast 04/18/23   Milford, Anderson Malta, FNP  metolazone (ZAROXOLYN) 5 MG tablet Take 1 tablet (5 mg total) by mouth once a week. 04/18/23 07/17/23  Jacklynn Ganong, FNP  mexiletine (MEXITIL) 150 MG capsule Take 1 capsule (150 mg total) by mouth every 12 (twelve) hours. 08/09/23   Milford, Anderson Malta, FNP  MITIGARE 0.6 MG CAPS Take 0.6 mg by mouth daily as needed (gout flare). 04/10/19    Bensimhon, Bevelyn Buckles, MD  potassium chloride SA (KLOR-CON M) 20 MEQ tablet PLEASE TAKE POTASSIUM (4 TABLETS) IN THE MORNING AND (3 TABLETS) IN THE EVENINGS 05/23/23   Jacklynn Ganong, FNP  spironolactone (ALDACTONE) 25 MG tablet Take 1 tablet (25 mg total) by mouth daily. 07/07/23 10/05/23  Bensimhon, Bevelyn Buckles, MD  sucralfate (CARAFATE) 1 g tablet Take 1 g by mouth daily.  09/03/20   [provider]  torsemide (DEMADEX) 20 MG tablet TAKE 3 TABLETS (60 MG TOTAL) BY MOUTH 2 (TWO) TIMES DAILY. 08/25/23   Milford, Anderson Malta, FNP      Allergies    Sherryll Burger [sacubitril-valsartan]    Review of Systems   Review of Systems  Respiratory:  Positive for shortness of breath.   Cardiovascular:  Positive for leg swelling.  All other systems reviewed and are negative.   Physical Exam Updated Vital Signs BP 100/61 (BP Location: Right Arm)   Pulse 82   Temp 98.9 F (37.2 C)   Resp 18   SpO2 100%  Physical Exam Vitals and nursing note reviewed.  Constitutional:      Appearance: Normal appearance.  HENT:     Head: Normocephalic and atraumatic.  Eyes:     Conjunctiva/sclera: Conjunctivae normal.  Cardiovascular:     Rate and Rhythm: Normal rate and regular rhythm.  Pulmonary:     Effort: Pulmonary effort is normal.  No respiratory distress.     Breath sounds: Normal breath sounds.  Abdominal:     General: There is distension.     Palpations: Abdomen is soft.     Tenderness: There is no abdominal tenderness.  Musculoskeletal:     Right lower leg: 3+ Pitting Edema present.     Left lower leg: 3+ Pitting Edema present.  Skin:    General: Skin is warm and dry.  Neurological:     General: No focal deficit present.     Mental Status: He is alert.     ED Results / Procedures / Treatments   Labs (all labs ordered are listed, but only abnormal results are displayed) Labs Reviewed  BASIC METABOLIC PANEL - Abnormal; Notable for the following components:      Result Value    Sodium 134 (*)    BUN 34 (*)    Creatinine, Ser 2.46 (*)    Calcium 8.3 (*)    GFR, Estimated 33 (*)    All other components within normal limits  CBC - Abnormal; Notable for the following components:   RDW 16.2 (*)    All other components within normal limits    EKG None  Radiology DG Chest 2 View  Result Date: 09/29/2023 CLINICAL DATA:  Edema, history of CHF EXAM: CHEST - 2 VIEW COMPARISON:  02/23/2022 FINDINGS: Left chest cardiac device and lead in unchanged position. Mild cardiomegaly. Unchanged mediastinal contours. Prominence of the central vasculature without pulmonary edema. No focal pulmonary opacity. No pleural effusion or pneumothorax. No acute osseous abnormality. IMPRESSION: Mild cardiomegaly and prominence of the central vasculature without pulmonary edema. Electronically Signed   By: Wiliam Ke M.D.   On: 09/29/2023 14:17    Procedures Procedures    Medications Ordered in ED Medications  furosemide (LASIX) injection 80 mg (80 mg Intravenous Given 09/29/23 2226)    ED Course/ Medical Decision Making/ A&P                                 Medical Decision Making Amount and/or Complexity of Data Reviewed Labs: ordered. Radiology: ordered.  This patient is a 42 y.o. male  who presents to the ED for concern of leg swelling.   Differential diagnoses prior to evaluation: The emergent differential diagnosis includes, but is not limited to,  CHF exacerbation, anasarca, cellulitis, DVT. This is not an exhaustive differential.   Past Medical History / Co-morbidities / Social History: CHF (LVEF 35 to 40%, echo 2023), s/p ICD and pacemaker, paroxysmal atrial flutter, hepatic cirrhosis, OSA on CPAP  Physical Exam: Physical exam performed. The pertinent findings include: 3+ pitting edema to the level of the thighs, abdominal distention, normal lung sounds and normal respiratory effort  Lab Tests/Imaging studies: I personally interpreted labs/imaging and the  pertinent results include: CBC unremarkable.  BMP with evidence of worsening kidney function, BUN 34, creatinine 2.46, GFR 33.  Chest x-ray with mild cardiomegaly and prominence of central vasculature without pulmonary edema. I agree with the radiologist interpretation.  Medications: I ordered medication including Lasix.  I have reviewed the patients home medicines and have made adjustments as needed.  Consultations obtained: I consulted with hospitalist Dr. Joneen Roach who will admit   Disposition: After consideration of the diagnostic results and the patients response to treatment, I feel that patient would benefit from medical admission for continued diuresis in the setting of volume overload and CHF failed outpatient treatment.  I discussed this case with my attending physician Dr. Rush Landmark who cosigned this note including patient's presenting symptoms, physical exam, and planned diagnostics and interventions. Attending physician stated agreement with plan or made changes to plan which were implemented.   Final Clinical Impression(s) / ED Diagnoses Final diagnoses:  Acute on chronic combined systolic and diastolic congestive heart failure (HCC)  Hypervolemia, unspecified hypervolemia type    Rx / DC Orders ED Discharge Orders     None      Portions of this report may have been transcribed using voice recognition software. Every effort was made to ensure accuracy; however, inadvertent computerized transcription errors may be present.    Jeanella Flattery 09/29/23 2227    Tegeler, Canary Brim, MD 09/30/23 0010

## 2023-09-29 NOTE — ED Notes (Signed)
Pt. Called for repeat vitals w/ no response

## 2023-09-29 NOTE — H&P (Incomplete)
PCP:   [name], MD   Chief Complaint:  Fluid overload  HPI: This is a 42 year old male with past medical history of paroxysmal atrial flutter s/p remote ablation, chronic combined systolic/diastolic CHF, NICM, OSA and morbid obesity.  He presents to the ER with complaints of increasing fluid retention on his legs up to his stomach.  Per patient his been compliant with his medications but believes the medications are working fast enough.  He has an appointment with cardiologist next week but the last time he waited too long to come to the hospital, he decided to come early.  He did endorses mild shortness of breath that started earlier this week.  He denies chest pains.  He endorses loss of cramping in his legs, arms, hands, back, neck, stomach.   Review of Systems:  Per HPI  Past Medical History: Past Medical History:  Diagnosis Date   Atrial flutter (HCC)    a. s/p ablation 03/2018.   Chronic combined systolic and diastolic CHF (congestive heart failure) (HCC)    History of esophagogastroduodenoscopy (EGD)    Morbid obesity (HCC)    NICM (nonischemic cardiomyopathy) (HCC)    OSA on CPAP    mild OSA with an AHI of 7.7/hr on auto CPAP   PVC's (premature ventricular contractions)    Past Surgical History:  Procedure Laterality Date   A-FLUTTER ABLATION N/A 04/06/2018   Procedure: A-FLUTTER ABLATION;  Surgeon: Marinus Maw, MD;  Location: [facility] INVASIVE CV LAB;  Service: Cardiovascular;  Laterality: N/A;   BIOPSY  07/11/2023   Procedure: BIOPSY;  Surgeon: Kathi Der, MD;  Location: [facility] ENDOSCOPY;  Service: Gastroenterology;;   ESOPHAGEAL BRUSHING  06/19/2020   Procedure: ESOPHAGEAL BRUSHING;  Surgeon: Charlott Rakes, MD;  Location: [facility] ENDOSCOPY;  Service: Endoscopy;;   ESOPHAGOGASTRODUODENOSCOPY (EGD) WITH PROPOFOL Left 06/19/2020   Procedure: ESOPHAGOGASTRODUODENOSCOPY (EGD) WITH PROPOFOL;  Surgeon: Charlott Rakes, MD;  Location: [facility] ENDOSCOPY;  Service: Endoscopy;   Laterality: Left;   ESOPHAGOGASTRODUODENOSCOPY (EGD) WITH PROPOFOL N/A 07/11/2023   Procedure: ESOPHAGOGASTRODUODENOSCOPY (EGD) WITH PROPOFOL;  Surgeon: Kathi Der, MD;  Location: [facility] ENDOSCOPY;  Service: Gastroenterology;  Laterality: N/A;   ICD IMPLANT N/A 02/22/2022   Procedure: ICD IMPLANT;  Surgeon: Marinus Maw, MD;  Location: [facility] INVASIVE CV LAB;  Service: Cardiovascular;  Laterality: N/A;   RIGHT HEART CATH N/A 12/20/2019   Procedure: RIGHT HEART CATH;  Surgeon: Dolores Patty, MD;  Location: [facility] INVASIVE CV LAB;  Service: Cardiovascular;  Laterality: N/A;   RIGHT/LEFT HEART CATH AND CORONARY ANGIOGRAPHY N/A 03/01/2017   Procedure: Right/Left Heart Cath and Coronary Angiography;  Surgeon: Dolores Patty, MD;  Location: [facility] INVASIVE CV LAB;  Service: Cardiovascular;  Laterality: N/A;    Medications: Prior to Admission medications   Medication Sig Start Date End Date Taking? Authorizing Provider  allopurinol (ZYLOPRIM) 100 MG tablet Take 2 tablets (200 mg total) by mouth daily. Please contact PCP for further refills 04/10/19   Bensimhon, Bevelyn Buckles, MD  amiodarone (PACERONE) 200 MG tablet Take 1 tablet (200 mg total) by mouth daily. 04/18/23   Milford, Anderson Malta, FNP  ELIQUIS 5 MG TABS tablet TAKE 1 TABLET BY MOUTH TWICE A DAY 08/17/23   Bensimhon, Bevelyn Buckles, MD  esomeprazole (NEXIUM) 40 MG capsule Take 40 mg by mouth 2 (two) times daily. 01/08/22   [provider]  JARDIANCE 10 MG TABS tablet TAKE 1 TABLET BY MOUTH EVERY DAY BEFORE BREAKFAST 03/31/23   Bensimhon, Bevelyn Buckles, MD  levothyroxine (SYNTHROID) 50  MCG tablet Take 1 tablet (50 mcg total) by mouth daily before breakfast. Take 1 hour before breakfast 04/18/23   Milford, Anderson Malta, FNP  metolazone (ZAROXOLYN) 5 MG tablet Take 1 tablet (5 mg total) by mouth once a week. 04/18/23 07/17/23  Jacklynn Ganong, FNP  mexiletine (MEXITIL) 150 MG capsule Take 1 capsule (150 mg total) by mouth every 12 (twelve) hours. 08/09/23    Milford, Anderson Malta, FNP  MITIGARE 0.6 MG CAPS Take 0.6 mg by mouth daily as needed (gout flare). 04/10/19   Bensimhon, Bevelyn Buckles, MD  potassium chloride SA (KLOR-CON M) 20 MEQ tablet PLEASE TAKE POTASSIUM (4 TABLETS) IN THE MORNING AND (3 TABLETS) IN THE EVENINGS 05/23/23   Jacklynn Ganong, FNP  spironolactone (ALDACTONE) 25 MG tablet Take 1 tablet (25 mg total) by mouth daily. 07/07/23 10/05/23  Bensimhon, Bevelyn Buckles, MD  sucralfate (CARAFATE) 1 g tablet Take 1 g by mouth daily.  09/03/20   [provider]  torsemide (DEMADEX) 20 MG tablet TAKE 3 TABLETS (60 MG TOTAL) BY MOUTH 2 (TWO) TIMES DAILY. 08/25/23   Jacklynn Ganong, FNP    Allergies:   Allergies  Allergen Reactions   Entresto [Sacubitril-Valsartan] Nausea And Vomiting and Other (See Comments)    Lightheaded, fainting    Social History:  reports that he has never smoked. He has never used smokeless tobacco. He reports current alcohol use. He reports that he does not use drugs.  Family History: Family History  Problem Relation Age of Onset   CVA Mother        pacemaker   Multiple sclerosis Mother    Seizures Mother    Hypertension Father    Gout Father     Physical Exam: Vitals:   09/29/23 1058 09/29/23 1444 09/29/23 1950  BP: 102/80 104/71 100/61  Pulse: 87 80 82  Resp: (!) 22 17 18   Temp: 98.4 F (36.9 C) 98.9 F (37.2 C) 98.9 F (37.2 C)  SpO2: 100% 100% 100%    General: A&O x 3, extreme morbid obesity, no acute distress Eyes: Pink conjunctiva, no scleral icterus ENT: Moist oral mucosa, neck supple, no thyromegaly Lungs: CTA B/L, no wheeze, no crackles, no use of accessory muscles Cardiovascular: Irregular rate, irregular rhythm, ++ JVD, no regurgitation, no gallops, no murmurs.  Abdomen: soft, positive BS, NTND, not an acute abdomen. Pitted edema lower abdomen GU: not examined Neuro: CN II - XII grossly intact, sensation intact Musculoskeletal: >>3+ B/L pitting edema up to  abdomen Skin: no rash, no subcutaneous crepitation, no decubitus Psych: appropriate patient  Labs on Admission:  Recent Labs    09/29/23 1114  NA 134*  K 5.1  CL 99  CO2 27  GLUCOSE 94  BUN 34*  CREATININE 2.46*  CALCIUM 8.3*   Recent Labs    09/29/23 1114  WBC 9.2  HGB 13.8  HCT 44.3  MCV 86.4  PLT 323     Radiological Exams on Admission: DG Chest 2 View  Result Date: 09/29/2023 CLINICAL DATA:  Edema, history of CHF EXAM: CHEST - 2 VIEW COMPARISON:  02/23/2022 FINDINGS: Left chest cardiac device and lead in unchanged position. Mild cardiomegaly. Unchanged mediastinal contours. Prominence of the central vasculature without pulmonary edema. No focal pulmonary opacity. No pleural effusion or pneumothorax. No acute osseous abnormality. IMPRESSION: Mild cardiomegaly and prominence of the central vasculature without pulmonary edema. Electronically Signed   By: Wiliam Ke M.D.   On: 09/29/2023 14:17    Assessment/Plan  Present on Admission:  Acute on chronic systolic heart failure  EF 35-40%// Edema// ICD  -CHF order set initiated -Lasix gtt started.  Patient with a soft blood pressure, less likely to tolerate bolused Lasix -Metolazone spironolactone on hold. -No beta-blockers, ACE with low blood pressure.  Jardiance on hold. -Strict I's and O's, daily weights -Will place cardiology consult   CKD stage 4 -At baseline, avoid nephrotoxic meds -May need nephrology consult   Permanent atrial fibrillation -Amiodarone, Eliquis resumed   Hepatic cirrhosis (HCC) -Likely secondary to NASH   Morbid obesity (HCC)// OSA -CAP ordered   Bradley Powell 09/29/2023, 10:23 PM

## 2023-09-30 DIAGNOSIS — I480 Paroxysmal atrial fibrillation: Secondary | ICD-10-CM | POA: Diagnosis not present

## 2023-09-30 DIAGNOSIS — I472 Ventricular tachycardia, unspecified: Secondary | ICD-10-CM | POA: Diagnosis not present

## 2023-09-30 DIAGNOSIS — Z6841 Body Mass Index (BMI) 40.0 and over, adult: Secondary | ICD-10-CM | POA: Diagnosis not present

## 2023-09-30 DIAGNOSIS — Z0000 Encounter for general adult medical examination without abnormal findings: Secondary | ICD-10-CM | POA: Diagnosis not present

## 2023-09-30 DIAGNOSIS — I509 Heart failure, unspecified: Secondary | ICD-10-CM | POA: Diagnosis not present

## 2023-09-30 DIAGNOSIS — Z950 Presence of cardiac pacemaker: Secondary | ICD-10-CM | POA: Diagnosis not present

## 2023-09-30 DIAGNOSIS — I459 Conduction disorder, unspecified: Secondary | ICD-10-CM | POA: Diagnosis present

## 2023-09-30 DIAGNOSIS — I482 Chronic atrial fibrillation, unspecified: Secondary | ICD-10-CM | POA: Diagnosis not present

## 2023-09-30 DIAGNOSIS — I5043 Acute on chronic combined systolic (congestive) and diastolic (congestive) heart failure: Secondary | ICD-10-CM

## 2023-09-30 DIAGNOSIS — I5023 Acute on chronic systolic (congestive) heart failure: Secondary | ICD-10-CM

## 2023-09-30 DIAGNOSIS — K7460 Unspecified cirrhosis of liver: Secondary | ICD-10-CM | POA: Diagnosis not present

## 2023-09-30 DIAGNOSIS — N1832 Chronic kidney disease, stage 3b: Secondary | ICD-10-CM | POA: Diagnosis not present

## 2023-09-30 DIAGNOSIS — N179 Acute kidney failure, unspecified: Secondary | ICD-10-CM | POA: Diagnosis not present

## 2023-09-30 DIAGNOSIS — R0989 Other specified symptoms and signs involving the circulatory and respiratory systems: Secondary | ICD-10-CM | POA: Diagnosis not present

## 2023-09-30 DIAGNOSIS — M170 Bilateral primary osteoarthritis of knee: Secondary | ICD-10-CM | POA: Diagnosis present

## 2023-09-30 DIAGNOSIS — I5021 Acute systolic (congestive) heart failure: Secondary | ICD-10-CM | POA: Diagnosis not present

## 2023-09-30 DIAGNOSIS — Z452 Encounter for adjustment and management of vascular access device: Secondary | ICD-10-CM | POA: Diagnosis not present

## 2023-09-30 DIAGNOSIS — I4891 Unspecified atrial fibrillation: Secondary | ICD-10-CM | POA: Diagnosis not present

## 2023-09-30 DIAGNOSIS — I493 Ventricular premature depolarization: Secondary | ICD-10-CM | POA: Diagnosis not present

## 2023-09-30 DIAGNOSIS — Z1152 Encounter for screening for COVID-19: Secondary | ICD-10-CM | POA: Diagnosis not present

## 2023-09-30 DIAGNOSIS — E66812 Obesity, class 2: Secondary | ICD-10-CM | POA: Diagnosis present

## 2023-09-30 DIAGNOSIS — J189 Pneumonia, unspecified organism: Secondary | ICD-10-CM | POA: Diagnosis not present

## 2023-09-30 DIAGNOSIS — D631 Anemia in chronic kidney disease: Secondary | ICD-10-CM | POA: Diagnosis not present

## 2023-09-30 DIAGNOSIS — M7989 Other specified soft tissue disorders: Secondary | ICD-10-CM | POA: Diagnosis not present

## 2023-09-30 DIAGNOSIS — I4892 Unspecified atrial flutter: Secondary | ICD-10-CM | POA: Diagnosis not present

## 2023-09-30 DIAGNOSIS — I272 Pulmonary hypertension, unspecified: Secondary | ICD-10-CM | POA: Diagnosis not present

## 2023-09-30 DIAGNOSIS — N189 Chronic kidney disease, unspecified: Secondary | ICD-10-CM | POA: Diagnosis not present

## 2023-09-30 DIAGNOSIS — G4733 Obstructive sleep apnea (adult) (pediatric): Secondary | ICD-10-CM | POA: Diagnosis present

## 2023-09-30 DIAGNOSIS — M109 Gout, unspecified: Secondary | ICD-10-CM | POA: Diagnosis not present

## 2023-09-30 DIAGNOSIS — D6869 Other thrombophilia: Secondary | ICD-10-CM | POA: Diagnosis not present

## 2023-09-30 DIAGNOSIS — I517 Cardiomegaly: Secondary | ICD-10-CM | POA: Diagnosis not present

## 2023-09-30 DIAGNOSIS — I428 Other cardiomyopathies: Secondary | ICD-10-CM | POA: Diagnosis not present

## 2023-09-30 DIAGNOSIS — E8809 Other disorders of plasma-protein metabolism, not elsewhere classified: Secondary | ICD-10-CM | POA: Diagnosis not present

## 2023-09-30 DIAGNOSIS — I4821 Permanent atrial fibrillation: Secondary | ICD-10-CM | POA: Diagnosis not present

## 2023-09-30 DIAGNOSIS — R001 Bradycardia, unspecified: Secondary | ICD-10-CM | POA: Diagnosis present

## 2023-09-30 DIAGNOSIS — J811 Chronic pulmonary edema: Secondary | ICD-10-CM | POA: Diagnosis not present

## 2023-09-30 DIAGNOSIS — R509 Fever, unspecified: Secondary | ICD-10-CM | POA: Diagnosis not present

## 2023-09-30 DIAGNOSIS — I130 Hypertensive heart and chronic kidney disease with heart failure and stage 1 through stage 4 chronic kidney disease, or unspecified chronic kidney disease: Secondary | ICD-10-CM | POA: Diagnosis not present

## 2023-09-30 DIAGNOSIS — E8770 Fluid overload, unspecified: Secondary | ICD-10-CM | POA: Diagnosis not present

## 2023-09-30 DIAGNOSIS — E039 Hypothyroidism, unspecified: Secondary | ICD-10-CM | POA: Diagnosis not present

## 2023-09-30 DIAGNOSIS — K259 Gastric ulcer, unspecified as acute or chronic, without hemorrhage or perforation: Secondary | ICD-10-CM | POA: Diagnosis not present

## 2023-09-30 DIAGNOSIS — E871 Hypo-osmolality and hyponatremia: Secondary | ICD-10-CM | POA: Diagnosis not present

## 2023-09-30 LAB — CBC WITH DIFFERENTIAL/PLATELET
Abs Immature Granulocytes: 0.05 10*3/uL (ref 0.00–0.07)
Basophils Absolute: 0.1 10*3/uL (ref 0.0–0.1)
Basophils Relative: 1 %
Eosinophils Absolute: 0 10*3/uL (ref 0.0–0.5)
Eosinophils Relative: 0 %
HCT: 44.3 % (ref 39.0–52.0)
Hemoglobin: 13.9 g/dL (ref 13.0–17.0)
Immature Granulocytes: 1 %
Lymphocytes Relative: 6 %
Lymphs Abs: 0.6 10*3/uL — ABNORMAL LOW (ref 0.7–4.0)
MCH: 26.7 pg (ref 26.0–34.0)
MCHC: 31.4 g/dL (ref 30.0–36.0)
MCV: 85.2 fL (ref 80.0–100.0)
Monocytes Absolute: 0.8 10*3/uL (ref 0.1–1.0)
Monocytes Relative: 9 %
Neutro Abs: 7.5 10*3/uL (ref 1.7–7.7)
Neutrophils Relative %: 83 %
Platelets: 310 10*3/uL (ref 150–400)
RBC: 5.2 MIL/uL (ref 4.22–5.81)
RDW: 16.3 % — ABNORMAL HIGH (ref 11.5–15.5)
WBC: 9 10*3/uL (ref 4.0–10.5)
nRBC: 0 % (ref 0.0–0.2)

## 2023-09-30 LAB — MAGNESIUM
Magnesium: 1.8 mg/dL (ref 1.7–2.4)
Magnesium: 1.9 mg/dL (ref 1.7–2.4)

## 2023-09-30 LAB — BRAIN NATRIURETIC PEPTIDE: B Natriuretic Peptide: 222.1 pg/mL — ABNORMAL HIGH (ref 0.0–100.0)

## 2023-09-30 MED ORDER — METHOCARBAMOL 500 MG PO TABS
500.0000 mg | ORAL_TABLET | Freq: Once | ORAL | Status: AC
  Administered 2023-09-30: 500 mg via ORAL
  Filled 2023-09-30: qty 1

## 2023-09-30 MED ORDER — PHENOL 1.4 % MT LIQD
1.0000 | OROMUCOSAL | Status: DC | PRN
  Administered 2023-09-30: 1 via OROMUCOSAL
  Filled 2023-09-30: qty 177

## 2023-09-30 MED ORDER — ALLOPURINOL 100 MG PO TABS
200.0000 mg | ORAL_TABLET | Freq: Every day | ORAL | Status: DC
  Administered 2023-09-30 – 2023-10-24 (×25): 200 mg via ORAL
  Filled 2023-09-30 (×26): qty 2

## 2023-09-30 MED ORDER — MAGNESIUM SULFATE IN D5W 1-5 GM/100ML-% IV SOLN
1.0000 g | Freq: Once | INTRAVENOUS | Status: AC
  Administered 2023-09-30: 1 g via INTRAVENOUS
  Filled 2023-09-30: qty 100

## 2023-09-30 MED ORDER — APIXABAN 5 MG PO TABS
5.0000 mg | ORAL_TABLET | Freq: Two times a day (BID) | ORAL | Status: DC
  Administered 2023-09-30 – 2023-10-24 (×49): 5 mg via ORAL
  Filled 2023-09-30 (×49): qty 1

## 2023-09-30 MED ORDER — SALINE SPRAY 0.65 % NA SOLN
1.0000 | NASAL | Status: DC | PRN
  Administered 2023-09-30: 1 via NASAL
  Filled 2023-09-30: qty 44

## 2023-09-30 MED ORDER — MEXILETINE HCL 150 MG PO CAPS
150.0000 mg | ORAL_CAPSULE | Freq: Two times a day (BID) | ORAL | Status: DC
  Administered 2023-09-30 – 2023-10-24 (×49): 150 mg via ORAL
  Filled 2023-09-30 (×50): qty 1

## 2023-09-30 MED ORDER — FUROSEMIDE 10 MG/ML IJ SOLN: 80.0000 mg | Freq: Two times a day (BID) | INTRAMUSCULAR | Status: DC

## 2023-09-30 MED ORDER — SODIUM CHLORIDE 0.9% FLUSH
3.0000 mL | INTRAVENOUS | Status: DC | PRN
Start: 2023-09-30 — End: 2023-09-30

## 2023-09-30 MED ORDER — LEVOTHYROXINE SODIUM 50 MCG PO TABS
50.0000 ug | ORAL_TABLET | Freq: Every day | ORAL | Status: DC
  Administered 2023-09-30 – 2023-10-05 (×6): 50 ug via ORAL
  Filled 2023-09-30 (×2): qty 1
  Filled 2023-09-30: qty 2
  Filled 2023-09-30 (×3): qty 1

## 2023-09-30 MED ORDER — CLONAZEPAM 0.5 MG PO TABS
1.0000 mg | ORAL_TABLET | Freq: Once | ORAL | Status: AC
  Administered 2023-09-30: 1 mg via ORAL
  Filled 2023-09-30: qty 2

## 2023-09-30 MED ORDER — SODIUM CHLORIDE 0.9 % IV SOLN
250.0000 mL | INTRAVENOUS | Status: DC | PRN
Start: 2023-09-30 — End: 2023-09-30

## 2023-09-30 MED ORDER — FUROSEMIDE 10 MG/ML IJ SOLN
25.0000 mg/h | INTRAVENOUS | Status: DC
  Administered 2023-09-30: 4 mg/h via INTRAVENOUS
  Administered 2023-10-01: 25 mg/h via INTRAVENOUS
  Administered 2023-10-01: 15 mg/h via INTRAVENOUS
  Administered 2023-10-02 – 2023-10-03 (×4): 25 mg/h via INTRAVENOUS
  Filled 2023-09-30 (×8): qty 20

## 2023-09-30 MED ORDER — SODIUM CHLORIDE 0.9% FLUSH
3.0000 mL | Freq: Two times a day (BID) | INTRAVENOUS | Status: DC
Start: 2023-09-30 — End: 2023-09-30
  Administered 2023-09-30: 3 mL via INTRAVENOUS

## 2023-09-30 MED ORDER — ACETAMINOPHEN 325 MG PO TABS
650.0000 mg | ORAL_TABLET | ORAL | Status: DC | PRN
  Administered 2023-10-01 – 2023-10-04 (×6): 650 mg via ORAL
  Filled 2023-09-30 (×7): qty 2

## 2023-09-30 MED ORDER — SPIRONOLACTONE 12.5 MG HALF TABLET: 25.0000 mg | ORAL_TABLET | Freq: Every day | ORAL | Status: DC

## 2023-09-30 MED ORDER — PANTOPRAZOLE SODIUM 40 MG PO TBEC
40.0000 mg | DELAYED_RELEASE_TABLET | Freq: Two times a day (BID) | ORAL | Status: DC
  Administered 2023-09-30 – 2023-10-24 (×49): 40 mg via ORAL
  Filled 2023-09-30 (×49): qty 1

## 2023-09-30 MED ORDER — HEPARIN SODIUM (PORCINE) 5000 UNIT/ML IJ SOLN: 5000.0000 [IU] | Freq: Three times a day (TID) | INTRAMUSCULAR | Status: DC

## 2023-09-30 MED ORDER — AMIODARONE HCL 200 MG PO TABS
200.0000 mg | ORAL_TABLET | Freq: Every day | ORAL | Status: DC
  Administered 2023-09-30 – 2023-10-24 (×25): 200 mg via ORAL
  Filled 2023-09-30 (×25): qty 1

## 2023-09-30 MED ORDER — SUCRALFATE 1 G PO TABS: 1.0000 g | ORAL_TABLET | Freq: Every day | ORAL | Status: DC

## 2023-09-30 MED ORDER — ONDANSETRON HCL 4 MG/2ML IJ SOLN: 4.0000 mg | Freq: Four times a day (QID) | INTRAMUSCULAR | Status: DC | PRN

## 2023-09-30 NOTE — Plan of Care (Signed)
  Problem: Nutrition: Goal: Adequate nutrition will be maintained Outcome: Completed/Met   Problem: Coping: Goal: Level of anxiety will decrease Outcome: Completed/Met   Problem: Pain Management: Goal: General experience of comfort will improve Outcome: Completed/Met   Problem: Safety: Goal: Ability to remain free from injury will improve Outcome: Completed/Met   Problem: Skin Integrity: Goal: Risk for impaired skin integrity will decrease Outcome: Completed/Met

## 2023-09-30 NOTE — Consult Note (Addendum)
Cardiology Consultation   Patient ID: Bradley Powell MRN: [id number]; DOB: Aug 18, 1981  Admit date: 09/29/2023 Date of Consult: 09/30/2023  PCP:  [name], MD   [facility] Providers Cardiologist:  None  Electrophysiologist:  Lewayne Bunting, MD  Advanced Heart Failure:  Arvilla Meres, MD  Sleep Medicine:  Armanda Magic, MD       Patient Profile:   Bradley Powell is a 42 y.o. male with a hx of (HFrEF) heart failure with reduced ejection fraction 2/2 infiltrative cardiomyopathy (LMNA cardiomyopathy), ventricular tachycardia, s/p ICD, atrial flutter s/p CTI ablation in 2019, chronic atrial fibrillation, chronic kidney disease, obesity, OSA, hypertension, chronic kidney disease, gout who is being seen 09/30/2023 for the evaluation of CHF at the request of Dr. Joneen Roach.  History of Present Illness:   Bradley Powell is followed by our AHF service. He has a non-ischemic cardiomyopathy. Cardiac catheterization in 2018 with no CAD. CMR was positive for infiltrative CM and genetics testing has been positive for LMNA cardiomyopathy. TTE 02/16/22: EF 35-40, mod reduced RVSF, mild pulmonary HTN, mod LaE, mild RAE, mild to mod MR, mod TR, AV sclerosis, RAP 3, RVSP 36.7, posterior effusion. He was admitted with VT in 02/2022 and underwent ICD implant at that time. He is maintained on Amio and Mexiletine. He was last seen in the HF clinic in 05/2023. Torsemide was increased b/c of volume overload. He is not on ACE/ARB/ARNI, BiDil due to hypotension. He is not on beta-blocker due to bradycardia/heart block.   He notes that he feels he started to gain fluid several weeks ago. He weighs infrequently. He was tracking his weights and stopped after he noted a 10 lb weight gain. He has been feeling more short of breath with minimal activity. He has noted orthopnea with dry cough at night while lying flat. He has not had chest pain. He notes increasing leg edema and abdominal edema. His L leg is  always greater than his R. He has not had syncope, ICD discharge.    Past Medical History:  Diagnosis Date   Atrial flutter (HCC)    a. s/p ablation 03/2018.   Chronic combined systolic and diastolic CHF (congestive heart failure) (HCC)    History of esophagogastroduodenoscopy (EGD)    Morbid obesity (HCC)    NICM (nonischemic cardiomyopathy) (HCC)    OSA on CPAP    mild OSA with an AHI of 7.7/hr on auto CPAP   PVC's (premature ventricular contractions)     Past Surgical History:  Procedure Laterality Date   A-FLUTTER ABLATION N/A 04/06/2018   Procedure: A-FLUTTER ABLATION;  Surgeon: Marinus Maw, MD;  Location: [facility] INVASIVE CV LAB;  Service: Cardiovascular;  Laterality: N/A;   BIOPSY  07/11/2023   Procedure: BIOPSY;  Surgeon: Kathi Der, MD;  Location: [facility] ENDOSCOPY;  Service: Gastroenterology;;   ESOPHAGEAL BRUSHING  06/19/2020   Procedure: ESOPHAGEAL BRUSHING;  Surgeon: Charlott Rakes, MD;  Location: [facility] ENDOSCOPY;  Service: Endoscopy;;   ESOPHAGOGASTRODUODENOSCOPY (EGD) WITH PROPOFOL Left 06/19/2020   Procedure: ESOPHAGOGASTRODUODENOSCOPY (EGD) WITH PROPOFOL;  Surgeon: Charlott Rakes, MD;  Location: [facility] ENDOSCOPY;  Service: Endoscopy;  Laterality: Left;   ESOPHAGOGASTRODUODENOSCOPY (EGD) WITH PROPOFOL N/A 07/11/2023   Procedure: ESOPHAGOGASTRODUODENOSCOPY (EGD) WITH PROPOFOL;  Surgeon: Kathi Der, MD;  Location: [facility] ENDOSCOPY;  Service: Gastroenterology;  Laterality: N/A;   ICD IMPLANT N/A 02/22/2022   Procedure: ICD IMPLANT;  Surgeon: Marinus Maw, MD;  Location: [facility] INVASIVE CV LAB;  Service: Cardiovascular;  Laterality: N/A;   RIGHT HEART  CATH N/A 12/20/2019   Procedure: RIGHT HEART CATH;  Surgeon: Dolores Patty, MD;  Location: [facility] INVASIVE CV LAB;  Service: Cardiovascular;  Laterality: N/A;   RIGHT/LEFT HEART CATH AND CORONARY ANGIOGRAPHY N/A 03/01/2017   Procedure: Right/Left Heart Cath and Coronary Angiography;  Surgeon: Dolores Patty, MD;   Location: [facility] INVASIVE CV LAB;  Service: Cardiovascular;  Laterality: N/A;     Home Medications:  Prior to Admission medications   Medication Sig Start Date End Date Taking? Authorizing Provider  acetaminophen (TYLENOL) 500 MG tablet Take 1,000 mg by mouth as needed for mild pain (pain score 1-3) (Leg pain).   Yes [provider]  allopurinol (ZYLOPRIM) 100 MG tablet Take 2 tablets (200 mg total) by mouth daily. Please contact PCP for further refills 04/10/19  Yes Jden Want, Bevelyn Buckles, MD  amiodarone (PACERONE) 200 MG tablet Take 1 tablet (200 mg total) by mouth daily. 04/18/23  Yes Milford, Anderson Malta, FNP  ELIQUIS 5 MG TABS tablet TAKE 1 TABLET BY MOUTH TWICE A DAY 08/17/23  Yes Nova Schmuhl, Bevelyn Buckles, MD  esomeprazole (NEXIUM) 40 MG capsule Take 40 mg by mouth 2 (two) times daily. 01/08/22  Yes [provider]  JARDIANCE 10 MG TABS tablet TAKE 1 TABLET BY MOUTH EVERY DAY BEFORE BREAKFAST 03/31/23  Yes Dejuan Elman, Bevelyn Buckles, MD  levothyroxine (SYNTHROID) 50 MCG tablet Take 1 tablet (50 mcg total) by mouth daily before breakfast. Take 1 hour before breakfast 04/18/23  Yes Milford, [place], FNP  metolazone (ZAROXOLYN) 2.5 MG tablet Take 2.5 mg by mouth as directed. 09/19/23  Yes [provider]  metolazone (ZAROXOLYN) 5 MG tablet Take 1 tablet (5 mg total) by mouth once a week. Patient taking differently: Take 5 mg by mouth once a week. Take on Sunday 04/18/23 09/28/28 Yes Milford, Anderson Malta, FNP  mexiletine (MEXITIL) 150 MG capsule Take 1 capsule (150 mg total) by mouth every 12 (twelve) hours. 08/09/23  Yes Milford, Anderson Malta, FNP  MITIGARE 0.6 MG CAPS Take 0.6 mg by mouth daily as needed (gout flare). 04/10/19  Yes Tresten Pantoja, Bevelyn Buckles, MD  potassium chloride SA (KLOR-CON M) 20 MEQ tablet PLEASE TAKE POTASSIUM (4 TABLETS) IN THE MORNING AND (3 TABLETS) IN THE EVENINGS 05/23/23  Yes [place]wn, [place], FNP  sodium chloride (OCEAN) 0.65 % nasal spray Place 2 sprays into the nose  as needed for congestion.   Yes [provider]  spironolactone (ALDACTONE) 25 MG tablet Take 1 tablet (25 mg total) by mouth daily. 07/07/23 10/05/23 Yes Teresia Myint, Bevelyn Buckles, MD  sucralfate (CARAFATE) 1 g tablet Take 1 g by mouth daily.  09/03/20  Yes [provider]  torsemide (DEMADEX) 20 MG tablet TAKE 3 TABLETS (60 MG TOTAL) BY MOUTH 2 (TWO) TIMES DAILY. 08/25/23  Yes Milford, Anderson Malta, FNP    Inpatient Medications: Scheduled Meds:  allopurinol  200 mg Oral Daily   amiodarone  200 mg Oral Daily   apixaban  5 mg Oral BID   levothyroxine  50 mcg Oral QAC breakfast   mexiletine  150 mg Oral Q12H   pantoprazole  40 mg Oral BID   sodium chloride flush  3 mL Intravenous Q12H   Continuous Infusions:  sodium chloride     furosemide (LASIX) 200 mg in dextrose 5 % 100 mL (2 mg/mL) infusion 4 mg/hr (09/30/23 0855)   PRN Meds: sodium chloride, acetaminophen, ondansetron (ZOFRAN) IV, sodium chloride flush  Allergies:    Allergies  Allergen Reactions  Entresto [Sacubitril-Valsartan] Nausea And Vomiting and Other (See Comments)    Lightheaded, fainting    Social History:   Social History   Socioeconomic History   Marital status: Single    Spouse name: Not on file   Number of children: Not on file   Years of education: Not on file   Highest education level: Not on file  Occupational History   Not on file  Tobacco Use   Smoking status: Never   Smokeless tobacco: Never  Vaping Use   Vaping status: Never Used  Substance and Sexual Activity   Alcohol use: Yes    Comment: rarely   Drug use: No   Sexual activity: Not Currently    Partners: Female  Other Topics Concern   Not on file  Social History Narrative   Not on file   Social Determinants of Health   Financial Resource Strain: Not on file  Food Insecurity: Not on file  Transportation Needs: Not on file  Physical Activity: Not on file  Stress: Not on file  Social Connections: Not on file  Intimate  Partner Violence: Not on file    Family History:    Family History  Problem Relation Age of Onset   CVA Mother        pacemaker   Multiple sclerosis Mother    Seizures Mother    Hypertension Father    Gout Father      ROS:  Please see the history of present illness.  No melena, hematochezia, fever. All other ROS reviewed and negative.     Physical Exam/Data:   Vitals:   09/30/23 0845 09/30/23 0915 09/30/23 1100 09/30/23 1133  BP: 109/79 103/76 102/64   Pulse: 70 77 77   Resp: (!) 35 (!) 40 11   Temp:    (!) 97.5 F (36.4 C)  TempSrc:    Oral  SpO2: 95% 96% 97%     Intake/Output Summary (Last 24 hours) at 09/30/2023 1157 Last data filed at 09/30/2023 0855 Gross per 24 hour  Intake 219.28 ml  Output 2130 ml  Net -1910.72 ml      07/25/2023    3:48 PM 07/11/2023    8:48 AM 06/23/2023    9:25 AM  Last 3 Weights  Weight (lbs) 271 lb 258 lb 271 lb 3.2 oz  Weight (kg) 122.925 kg 117.028 kg 123.016 kg     There is no height or weight on file to calculate BMI.  General:  Well nourished, well developed, in no acute distress  HEENT: normal Neck: + JVD Cardiac:  normal S1, S2; irreg irreg rhythm, no obvious murmur  Lungs:  clear to auscultation bilaterally, no wheezing, rhonchi or rales  Abd: distended, 2+ edema Ext: 3+ bilat LE edema up to the thighs, L>R Musculoskeletal:  No deformities  Skin: warm and dry  Neuro:  CNs 2-12 intact, no focal abnormalities noted Psych:  Normal affect   EKG:  The EKG was personally reviewed and demonstrates:  atrial fibrillation, HR 83 Telemetry:  Telemetry was personally reviewed and demonstrates:  atrial fibrillation   Laboratory Data:  Chemistry Recent Labs  Lab 09/29/23 1114 09/30/23 0518  NA 134*  --   K 5.1  --   CL 99  --   CO2 27  --   GLUCOSE 94  --   BUN 34*  --   CREATININE 2.46*  --   CALCIUM 8.3*  --   MG 1.8 1.9  GFRNONAA 33*  --  ANIONGAP 8  --      Hematology Recent Labs  Lab 09/29/23 1114  09/30/23 0513  WBC 9.2 9.0  RBC 5.13 5.20  HGB 13.8 13.9  HCT 44.3 44.3  MCV 86.4 85.2  MCH 26.9 26.7  MCHC 31.2 31.4  RDW 16.2* 16.3*  PLT 323 310   BNP Recent Labs  Lab 09/30/23 0513  BNP 222.1*     Radiology/Studies:  DG Chest 2 View  Result Date: 09/29/2023 CLINICAL DATA:  Edema, history of CHF EXAM: CHEST - 2 VIEW COMPARISON:  02/23/2022 FINDINGS: Left chest cardiac device and lead in unchanged position. Mild cardiomegaly. Unchanged mediastinal contours. Prominence of the central vasculature without pulmonary edema. No focal pulmonary opacity. No pleural effusion or pneumothorax. No acute osseous abnormality. IMPRESSION: Mild cardiomegaly and prominence of the central vasculature without pulmonary edema. Electronically Signed   By: Wiliam Ke M.D.   On: 09/29/2023 14:17     Assessment and Plan:   Acute on chronic (HFrEF) heart failure with reduced ejection fraction  He is significantly volume overloaded. He has not been weighed yet here. He says he stopped weighing when he reached 260 lbs. Last visit in the office with EP in 06/2023, he was 270 lbs. He notes he has been adherent with his meds. He sometimes has to hold Torsemide if he is out. He feels like he has not been eating a high salt diet. He is on Metolazone 5 mg weekly, Torsemide 60 mg twice daily, Spironolactone 25 mg once daily at home. He has not been tx with beta-blocker due to bradycardia and ACE/ARB/ARNI or BiDil due to low BP. He is currently on Lasix gtt. He is -1916 cc since admit. - Continue current management. CHF team to see.  Ventricular tachycardia s/p ICD  Continue Amiodarone 200 once daily, mexiletine 150 twice daily.  Permanent atrial fibrillation/flutter Rate is controlled. Hgb normal. Based on weight, age, continue Eliquis 5 mg twice daily   Chronic kidney disease  Creatinine usually 1.8-2. SCr up to 2.46 here. Will continue to monitor. SCr should improve with diuresis.    Risk  Assessment/Risk Scores:       New York Heart Association (NYHA) Functional Class NYHA Class IV  CHA2DS2-VASc Score = 2   This indicates a 2.2% annual risk of stroke. The patient's score is based upon: CHF History: 1 HTN History: 1 Diabetes History: 0 Stroke History: 0 Vascular Disease History: 0 Age Score: 0 Gender Score: 0     For questions or updates, please contact [facility] Please consult [email or web address] for contact info under    Signed, Tereso Newcomer, PA-C  09/30/2023 11:57 AM  Patient seen and examined with the above-signed Advanced Practice Provider and/or Housestaff. I personally reviewed laboratory data, imaging studies and relevant notes. I independently examined the patient and formulated the important aspects of the plan. I have edited the note to reflect any of my changes or salient points. I have personally discussed the plan with the patient and/or family.  42 y/o male with systolic HF  due to LMNA cardiomyopathy EF 35-40%   Now admitted with marked volume overload and AKI. Oral diuretics not working.   General:  Obese male No resp difficulty HEENT: normal Neck: supple. JVP to ear Carotids 2+ bilat; no bruits. No lymphadenopathy or thryomegaly appreciated. Cor: Irreg  rate & rhythm. No rubs, gallops or murmurs. Lungs: clear Abdomen: obese soft, nontender, ++ distended. No hepatosplenomegaly. No bruits or masses. Good bowel sounds. Extremities: no  cyanosis, clubbing, rash, 3+  edema Neuro: alert & orientedx3, cranial nerves grossly intact. moves all 4 extremities w/o difficulty. Affect pleasant  He is markedly volume overloaded now on lasix gtt at 4. Will increase to 15/hr. Can use metolazone as needed   Repeat echo.   Continue Eliquis   Arvilla Meres, MD  3:48 PM

## 2023-09-30 NOTE — Progress Notes (Signed)
PROGRESS NOTE    Bradley Powell  GEX:[id number] DOB: December 30, 1980 DOA: 09/29/2023 PCP: [name], MD    Brief Narrative:  42 year old male with past medical history of paroxysmal atrial flutter s/p remote ablation, chronic combined systolic/diastolic CHF, NICM, OSA and morbid obesity.  He presents to the ER with complaints of increasing fluid retention on his legs up to his stomach.  Per patient his been compliant with his medication   Assessment and Plan: Acute on chronic systolic heart failure  EF 35-40%// Edema// ICD  -CHF order set initiated -Lasix gtt started -Metolazone spironolactone on hold. -No beta-blockers, ACE with low blood pressure.  Jardiance on hold. -Strict I's and O's, daily weights -cardiology consult   CKD stage 4 -At baseline, avoid nephrotoxic meds -daily labs   Permanent atrial fibrillation -Amiodarone/ Eliquis    Hepatic cirrhosis (HCC) -Likely secondary to NASH   Morbid obesity (HCC)// OSA Estimated body mass index is 41.21 kg/m as calculated from the following:   Height as of 07/25/23: 5\' 8"  (1.727 m).   Weight as of 07/25/23: 122.9 kg.    DVT prophylaxis:  apixaban (ELIQUIS) tablet 5 mg    Code Status: Full Code   Disposition Plan:  Level of care: Telemetry Cardiac Status is: Inpatient Remains inpatient appropriate     Consultants:  cards   Subjective:  Has been urinating Says he has not missed any meds or changed his diet  Objective: Vitals:   09/30/23 0845 09/30/23 0915 09/30/23 1100 09/30/23 1133  BP: 109/79 103/76 102/64   Pulse: 70 77 77   Resp: (!) 35 (!) 40 11   Temp:    (!) 97.5 F (36.4 C)  TempSrc:    Oral  SpO2: 95% 96% 97%     Intake/Output Summary (Last 24 hours) at 09/30/2023 1214 Last data filed at 09/30/2023 0855 Gross per 24 hour  Intake 219.28 ml  Output 2130 ml  Net -1910.72 ml   There were no vitals filed for this visit.  Examination:   General: Appearance:    Severely obese male in  no acute distress     Lungs:     respirations unlabored  Heart:    Normal heart rate. Marland Kitchen +LE edema   MS:   All extremities are intact.    Neurologic:   Awake, alert, oriented x 3. No apparent focal neurological           defect.        Data Reviewed: I have personally reviewed following labs and imaging studies  CBC: Recent Labs  Lab 09/29/23 1114 09/30/23 0513  WBC 9.2 9.0  NEUTROABS  --  7.5  HGB 13.8 13.9  HCT 44.3 44.3  MCV 86.4 85.2  PLT 323 310   Basic Metabolic Panel: Recent Labs  Lab 09/29/23 1114 09/30/23 0518  NA 134*  --   K 5.1  --   CL 99  --   CO2 27  --   GLUCOSE 94  --   BUN 34*  --   CREATININE 2.46*  --   CALCIUM 8.3*  --   MG 1.8 1.9   GFR: CrCl cannot be calculated (Unknown ideal weight.). Liver Function Tests: No results for input(s): "AST", "ALT", "ALKPHOS", "BILITOT", "PROT", "ALBUMIN" in the last 168 hours. No results for input(s): "LIPASE", "AMYLASE" in the last 168 hours. No results for input(s): "AMMONIA" in the last 168 hours. Coagulation Profile: No results for input(s): "INR", "PROTIME" in the last 168 hours. Cardiac Enzymes:  No results for input(s): "CKTOTAL", "CKMB", "CKMBINDEX", "TROPONINI" in the last 168 hours. BNP (last 3 results) No results for input(s): "PROBNP" in the last 8760 hours. HbA1C: No results for input(s): "HGBA1C" in the last 72 hours. CBG: No results for input(s): "GLUCAP" in the last 168 hours. Lipid Profile: No results for input(s): "CHOL", "HDL", "LDLCALC", "TRIG", "CHOLHDL", "LDLDIRECT" in the last 72 hours. Thyroid Function Tests: No results for input(s): "TSH", "T4TOTAL", "FREET4", "T3FREE", "THYROIDAB" in the last 72 hours. Anemia Panel: No results for input(s): "VITAMINB12", "FOLATE", "FERRITIN", "TIBC", "IRON", "RETICCTPCT" in the last 72 hours. Sepsis Labs: No results for input(s): "PROCALCITON", "LATICACIDVEN" in the last 168 hours.  No results found for this or any previous visit (from the  past 240 hour(s)).       Radiology Studies: DG Chest 2 View  Result Date: 09/29/2023 CLINICAL DATA:  Edema, history of CHF EXAM: CHEST - 2 VIEW COMPARISON:  02/23/2022 FINDINGS: Left chest cardiac device and lead in unchanged position. Mild cardiomegaly. Unchanged mediastinal contours. Prominence of the central vasculature without pulmonary edema. No focal pulmonary opacity. No pleural effusion or pneumothorax. No acute osseous abnormality. IMPRESSION: Mild cardiomegaly and prominence of the central vasculature without pulmonary edema. Electronically Signed   By: Wiliam Ke M.D.   On: 09/29/2023 14:17        Scheduled Meds:  allopurinol  200 mg Oral Daily   amiodarone  200 mg Oral Daily   apixaban  5 mg Oral BID   levothyroxine  50 mcg Oral QAC breakfast   mexiletine  150 mg Oral Q12H   pantoprazole  40 mg Oral BID   Continuous Infusions:  furosemide (LASIX) 200 mg in dextrose 5 % 100 mL (2 mg/mL) infusion 4 mg/hr (09/30/23 0855)     LOS: 0 days    Time spent: 45 minutes spent on chart review, discussion with nursing staff, consultants, updating family and interview/physical exam; more than 50% of that time was spent in counseling and/or coordination of care.    Joseph Art, DO [facility] Available via Epic secure chat 7am-7pm After these hours, please refer to coverage provider listed on [email or web address] 09/30/2023, 12:14 PM

## 2023-09-30 NOTE — ED Notes (Signed)
ED TO INPATIENT HANDOFF REPORT  ED Nurse Name and Phone #: [phone]  S Name/Age/Gender Bradley Powell 42 y.o. male Room/Bed: 012C/012C  Code Status   Code Status: Full Code  Home/SNF/Other Home Patient oriented to: self, place, time, and situation Is this baseline? Yes   Triage Complete: Triage complete  Chief Complaint Acute on chronic congestive heart failure (HCC) [I50.9]  Triage Note Pt endorses hx of CHF; having  bilateral LE edema x 2.5 weeks; takes torsemide at home, endorses compliance; endorses intermittent sob; denies pain currently; NAD in triage   Allergies Allergies  Allergen Reactions   Entresto [Sacubitril-Valsartan] Nausea And Vomiting and Other (See Comments)    Lightheaded, fainting    Level of Care/Admitting Diagnosis ED Disposition     ED Disposition  Admit   Condition  --   Comment  Hospital Area: [facility] [[id number]]  Level of Care: Telemetry Cardiac [[id number]]  May admit patient to [facility] or [facility] if equivalent level of care is available:: No  Covid Evaluation: Asymptomatic - no recent exposure (last 10 days) testing not required  Diagnosis: Acute on chronic congestive heart failure [facility]) [[id number]]  Admitting Physician: Gery Pray [[id number]]  Attending Physician: Gery Pray [[id number]]  Certification:: I certify this patient will need inpatient services for at least 2 midnights  Expected Medical Readiness: 10/02/2023          B Medical/Surgery History Past Medical History:  Diagnosis Date   Atrial flutter (HCC)    a. s/p ablation 03/2018.   Chronic combined systolic and diastolic CHF (congestive heart failure) (HCC)    History of esophagogastroduodenoscopy (EGD)    Morbid obesity (HCC)    NICM (nonischemic cardiomyopathy) (HCC)    OSA on CPAP    mild OSA with an AHI of 7.7/hr on auto CPAP   PVC's (premature ventricular contractions)    Past Surgical History:  Procedure Laterality Date   A-FLUTTER  ABLATION N/A 04/06/2018   Procedure: A-FLUTTER ABLATION;  Surgeon: Marinus Maw, MD;  Location: [facility] INVASIVE CV LAB;  Service: Cardiovascular;  Laterality: N/A;   BIOPSY  07/11/2023   Procedure: BIOPSY;  Surgeon: Kathi Der, MD;  Location: [facility] ENDOSCOPY;  Service: Gastroenterology;;   ESOPHAGEAL BRUSHING  06/19/2020   Procedure: ESOPHAGEAL BRUSHING;  Surgeon: Charlott Rakes, MD;  Location: [facility] ENDOSCOPY;  Service: Endoscopy;;   ESOPHAGOGASTRODUODENOSCOPY (EGD) WITH PROPOFOL Left 06/19/2020   Procedure: ESOPHAGOGASTRODUODENOSCOPY (EGD) WITH PROPOFOL;  Surgeon: Charlott Rakes, MD;  Location: [facility] ENDOSCOPY;  Service: Endoscopy;  Laterality: Left;   ESOPHAGOGASTRODUODENOSCOPY (EGD) WITH PROPOFOL N/A 07/11/2023   Procedure: ESOPHAGOGASTRODUODENOSCOPY (EGD) WITH PROPOFOL;  Surgeon: Kathi Der, MD;  Location: [facility] ENDOSCOPY;  Service: Gastroenterology;  Laterality: N/A;   ICD IMPLANT N/A 02/22/2022   Procedure: ICD IMPLANT;  Surgeon: Marinus Maw, MD;  Location: [facility] INVASIVE CV LAB;  Service: Cardiovascular;  Laterality: N/A;   RIGHT HEART CATH N/A 12/20/2019   Procedure: RIGHT HEART CATH;  Surgeon: Dolores Patty, MD;  Location: [facility] INVASIVE CV LAB;  Service: Cardiovascular;  Laterality: N/A;   RIGHT/LEFT HEART CATH AND CORONARY ANGIOGRAPHY N/A 03/01/2017   Procedure: Right/Left Heart Cath and Coronary Angiography;  Surgeon: Dolores Patty, MD;  Location: [facility] INVASIVE CV LAB;  Service: Cardiovascular;  Laterality: N/A;     A IV Location/Drains/Wounds Patient Lines/Drains/Airways Status     Active Line/Drains/Airways     Name Placement date Placement time Site Days   Peripheral IV 09/29/23 22 G Left Hand 09/29/23  2208  Hand  1            Intake/Output Last 24 hours  Intake/Output Summary (Last 24 hours) at 09/30/2023 1251 Last data filed at 09/30/2023 1242 Gross per 24 hour  Intake 226.84 ml  Output 2130 ml  Net -1903.16 ml    Labs/Imaging Results for  orders placed or performed during the hospital encounter of 09/29/23 (from the past 48 hour(s))  Basic metabolic panel     Status: Abnormal   Collection Time: 09/29/23 11:14 AM  Result Value Ref Range   Sodium 134 (L) 135 - 145 mmol/L   Potassium 5.1 3.5 - 5.1 mmol/L   Chloride 99 98 - 111 mmol/L   CO2 27 22 - 32 mmol/L   Glucose, Bld 94 70 - 99 mg/dL    Comment: Glucose reference range applies only to samples taken after fasting for at least 8 hours.   BUN 34 (H) 6 - 20 mg/dL   Creatinine, Ser 6.44 (H) 0.61 - 1.24 mg/dL   Calcium 8.3 (L) 8.9 - 10.3 mg/dL   GFR, Estimated 33 (L) >60 mL/min    Comment: (NOTE) Calculated using the CKD-EPI Creatinine Equation (2021)    Anion gap 8 5 - 15    Comment: Performed at [facility], [address], [place], [place] [address]  CBC     Status: Abnormal   Collection Time: 09/29/23 11:14 AM  Result Value Ref Range   WBC 9.2 4.0 - 10.5 K/uL   RBC 5.13 4.22 - 5.81 MIL/uL   Hemoglobin 13.8 13.0 - 17.0 g/dL   HCT 25.9 56.3 - 87.5 %   MCV 86.4 80.0 - 100.0 fL   MCH 26.9 26.0 - 34.0 pg   MCHC 31.2 30.0 - 36.0 g/dL   RDW 64.3 (H) 32.9 - 51.8 %   Platelets 323 150 - 400 K/uL   nRBC 0.0 0.0 - 0.2 %    Comment: Performed at [facility], [address] [place], [place] [address]  Magnesium     Status: None   Collection Time: 09/29/23 11:14 AM  Result Value Ref Range   Magnesium 1.8 1.7 - 2.4 mg/dL    Comment: Performed at [facility], [address] [place], [place] [address]  CBC with Differential/Platelet     Status: Abnormal   Collection Time: 09/30/23  5:13 AM  Result Value Ref Range   WBC 9.0 4.0 - 10.5 K/uL   RBC 5.20 4.22 - 5.81 MIL/uL   Hemoglobin 13.9 13.0 - 17.0 g/dL   HCT 60.1 09.3 - 23.5 %   MCV 85.2 80.0 - 100.0 fL   MCH 26.7 26.0 - 34.0 pg   MCHC 31.4 30.0 - 36.0 g/dL   RDW 57.3 (H) 22.0 - 25.4 %   Platelets 310 150 - 400 K/uL   nRBC 0.0 0.0 - 0.2 %   Neutrophils Relative % 83 %   Neutro Abs 7.5 1.7 -  7.7 K/uL   Lymphocytes Relative 6 %   Lymphs Abs 0.6 (L) 0.7 - 4.0 K/uL   Monocytes Relative 9 %   Monocytes Absolute 0.8 0.1 - 1.0 K/uL   Eosinophils Relative 0 %   Eosinophils Absolute 0.0 0.0 - 0.5 K/uL   Basophils Relative 1 %   Basophils Absolute 0.1 0.0 - 0.1 K/uL   Immature Granulocytes 1 %   Abs Immature Granulocytes 0.05 0.00 - 0.07 K/uL    Comment: Performed at [facility], [address] [place], [place] [address]  Brain natriuretic peptide     Status: Abnormal   Collection Time: 09/30/23  5:13 AM  Result Value Ref Range   B Natriuretic Peptide 222.1 (H) 0.0 - 100.0 pg/mL    Comment: Performed at [facility], [address], [place], [place] [address]  Magnesium     Status: None   Collection Time: 09/30/23  5:18 AM  Result Value Ref Range   Magnesium 1.9 1.7 - 2.4 mg/dL    Comment: Performed at [facility], [address] [place], [place] [address]   DG Chest 2 View  Result Date: 09/29/2023 CLINICAL DATA:  Edema, history of CHF EXAM: CHEST - 2 VIEW COMPARISON:  02/23/2022 FINDINGS: Left chest cardiac device and lead in unchanged position. Mild cardiomegaly. Unchanged mediastinal contours. Prominence of the central vasculature without pulmonary edema. No focal pulmonary opacity. No pleural effusion or pneumothorax. No acute osseous abnormality. IMPRESSION: Mild cardiomegaly and prominence of the central vasculature without pulmonary edema. Electronically Signed   By: Wiliam Ke M.D.   On: 09/29/2023 14:17    Pending Labs Unresulted Labs (From admission, onward)     Start     Ordered   10/01/23 0500  Basic metabolic panel  Daily,   R     Comments: As Scheduled for 5 days    09/30/23 0344   10/01/23 0500  CBC  Tomorrow morning,   R        09/30/23 1219            Vitals/Pain Today's Vitals   09/30/23 1100 09/30/23 1133 09/30/23 1200 09/30/23 1230  BP: 102/64  104/79 108/76  Pulse: 77  68 75  Resp: 11   18  Temp:  (!) 97.5 F (36.4  C)    TempSrc:  Oral    SpO2: 97%  93% 94%  PainSc:        Isolation Precautions No active isolations  Medications Medications  allopurinol (ZYLOPRIM) tablet 200 mg (200 mg Oral Given 09/30/23 0920)  amiodarone (PACERONE) tablet 200 mg (200 mg Oral Given 09/30/23 0504)  levothyroxine (SYNTHROID) tablet 50 mcg (50 mcg Oral Given 09/30/23 0920)  mexiletine (MEXITIL) capsule 150 mg (150 mg Oral Given 09/30/23 0505)  apixaban (ELIQUIS) tablet 5 mg (5 mg Oral Given 09/30/23 0503)  acetaminophen (TYLENOL) tablet 650 mg (has no administration in time range)  ondansetron (ZOFRAN) injection 4 mg (has no administration in time range)  furosemide (LASIX) 200 mg in dextrose 5 % 100 mL (2 mg/mL) infusion (4 mg/hr Intravenous Infusion Verify 09/30/23 1242)  pantoprazole (PROTONIX) EC tablet 40 mg (40 mg Oral Given 09/30/23 0503)  furosemide (LASIX) injection 80 mg (80 mg Intravenous Given 09/29/23 2226)  magnesium sulfate IVPB 1 g 100 mL (0 g Intravenous Stopped 09/30/23 0610)  clonazePAM (KLONOPIN) tablet 1 mg (1 mg Oral Given 09/30/23 0503)    Mobility walks     Focused Assessments Pulmonary Assessment Handoff:  Lung sounds:   O2 Device: Room Air      R Recommendations: See Admitting Provider Note  Report given to:   Additional Notes:

## 2023-10-01 DIAGNOSIS — I5023 Acute on chronic systolic (congestive) heart failure: Secondary | ICD-10-CM | POA: Diagnosis not present

## 2023-10-01 DIAGNOSIS — E8770 Fluid overload, unspecified: Secondary | ICD-10-CM | POA: Diagnosis not present

## 2023-10-01 DIAGNOSIS — I5043 Acute on chronic combined systolic (congestive) and diastolic (congestive) heart failure: Secondary | ICD-10-CM | POA: Diagnosis not present

## 2023-10-01 LAB — CBC
HCT: 43.1 % (ref 39.0–52.0)
Hemoglobin: 13.7 g/dL (ref 13.0–17.0)
MCH: 27 pg (ref 26.0–34.0)
MCHC: 31.8 g/dL (ref 30.0–36.0)
MCV: 85 fL (ref 80.0–100.0)
Platelets: 302 10*3/uL (ref 150–400)
RBC: 5.07 MIL/uL (ref 4.22–5.81)
RDW: 16.3 % — ABNORMAL HIGH (ref 11.5–15.5)
WBC: 10.2 10*3/uL (ref 4.0–10.5)
nRBC: 0 % (ref 0.0–0.2)

## 2023-10-01 LAB — BASIC METABOLIC PANEL
Anion gap: 8 (ref 5–15)
BUN: 28 mg/dL — ABNORMAL HIGH (ref 6–20)
CO2: 26 mmol/L (ref 22–32)
Calcium: 7.9 mg/dL — ABNORMAL LOW (ref 8.9–10.3)
Chloride: 101 mmol/L (ref 98–111)
Creatinine, Ser: 2.09 mg/dL — ABNORMAL HIGH (ref 0.61–1.24)
GFR, Estimated: 40 mL/min — ABNORMAL LOW (ref >60)
Glucose, Bld: 93 mg/dL (ref 70–99)
Potassium: 3.3 mmol/L — ABNORMAL LOW (ref 3.5–5.1)
Sodium: 135 mmol/L (ref 135–145)

## 2023-10-01 MED ORDER — SPIRONOLACTONE 25 MG PO TABS
25.0000 mg | ORAL_TABLET | Freq: Every day | ORAL | Status: DC
  Administered 2023-10-01 – 2023-10-06 (×6): 25 mg via ORAL
  Filled 2023-10-01 (×6): qty 1

## 2023-10-01 MED ORDER — POTASSIUM CHLORIDE CRYS ER 10 MEQ PO TBCR
20.0000 meq | EXTENDED_RELEASE_TABLET | Freq: Two times a day (BID) | ORAL | Status: DC
  Administered 2023-10-01 – 2023-10-02 (×3): 20 meq via ORAL
  Filled 2023-10-01 (×7): qty 2

## 2023-10-01 MED ORDER — POTASSIUM CHLORIDE CRYS ER 20 MEQ PO TBCR
40.0000 meq | EXTENDED_RELEASE_TABLET | Freq: Once | ORAL | Status: AC
  Administered 2023-10-01: 40 meq via ORAL
  Filled 2023-10-01: qty 2

## 2023-10-01 MED ORDER — METOLAZONE 5 MG PO TABS
5.0000 mg | ORAL_TABLET | Freq: Once | ORAL | Status: AC
  Administered 2023-10-01: 5 mg via ORAL
  Filled 2023-10-01: qty 1

## 2023-10-01 MED ORDER — EMPAGLIFLOZIN 10 MG PO TABS
10.0000 mg | ORAL_TABLET | Freq: Every day | ORAL | Status: DC
  Administered 2023-10-01 – 2023-10-24 (×24): 10 mg via ORAL
  Filled 2023-10-01 (×24): qty 1

## 2023-10-01 NOTE — Progress Notes (Signed)
PROGRESS NOTE    Bradley Powell  HQI:[id number] DOB: 10/31/1981 DOA: 09/29/2023 PCP: [name], MD    Brief Narrative:  42 year old male with past medical history of paroxysmal atrial flutter s/p remote ablation, chronic combined systolic/diastolic CHF, NICM, OSA and morbid obesity.  He presents to the ER with complaints of increasing fluid retention on his legs up to his stomach.  Per patient his been compliant with his medication.  On lasix gtt directed by CHF team  Assessment and Plan: Acute on chronic systolic heart failure  EF 35-40%// Edema// ICD  -CHF order set initiated -Lasix gtt started and adjusted -Metolazone/spironolactone on hold. -No beta-blockers, ACE with low blood pressure.  Jardiance on hold. -Strict I's and O's, daily weights -cardiology consult appreciated -down 3.9L   CKD stage 4 -trending down   Permanent atrial fibrillation -Amiodarone/ Eliquis    Hepatic cirrhosis (HCC) -Likely secondary to NASH   Morbid obesity (HCC)// OSA Estimated body mass index is 46.07 kg/m as calculated from the following:   Height as of this encounter: 5\' 8"  (1.727 m).   Weight as of this encounter: 137.4 kg.   Hypokalemia -replete  DVT prophylaxis:  apixaban (ELIQUIS) tablet 5 mg    Code Status: Full Code   Disposition Plan:  Level of care: Telemetry Cardiac Status is: Inpatient Remains inpatient appropriate     Consultants:  cards   Subjective:  Still feels like he has a lot of fluid on him  Objective: Vitals:   09/30/23 1950 09/30/23 2359 10/01/23 0409 10/01/23 0755  BP: 125/79 104/77 106/65 (!) 122/99  Pulse: 81 71 66 84  Resp: 19 19 20 19   Temp: 97.9 F (36.6 C) 97.9 F (36.6 C) 97.7 F (36.5 C) 98.9 F (37.2 C)  TempSrc: Oral Oral Oral Oral  SpO2: 100% 100% 100% 100%  Weight:   (!) 137.4 kg   Height:        Intake/Output Summary (Last 24 hours) at 10/01/2023 0820 Last data filed at 10/01/2023 0413 Gross per 24 hour  Intake  32.32 ml  Output 2100 ml  Net -2067.68 ml   Filed Weights   09/30/23 1434 10/01/23 0409  Weight: (!) 136.5 kg (!) 137.4 kg    Examination:    General: Appearance:    Severely obese male in no acute distress     Lungs:      respirations unlabored  Heart:    Normal heart rate. + LE edema  MS:   All extremities are intact.   Neurologic:   Awake, alert, oriented x 3. No apparent focal neurological           defect.        Data Reviewed: I have personally reviewed following labs and imaging studies  CBC: Recent Labs  Lab 09/29/23 1114 09/30/23 0513 10/01/23 0319  WBC 9.2 9.0 10.2  NEUTROABS  --  7.5  --   HGB 13.8 13.9 13.7  HCT 44.3 44.3 43.1  MCV 86.4 85.2 85.0  PLT 323 310 302   Basic Metabolic Panel: Recent Labs  Lab 09/29/23 1114 09/30/23 0518 10/01/23 0319  NA 134*  --  135  K 5.1  --  3.3*  CL 99  --  101  CO2 27  --  26  GLUCOSE 94  --  93  BUN 34*  --  28*  CREATININE 2.46*  --  2.09*  CALCIUM 8.3*  --  7.9*  MG 1.8 1.9  --    GFR:  Estimated Creatinine Clearance: 63.8 mL/min (A) (by C-G formula based on SCr of 2.09 mg/dL (H)). Liver Function Tests: No results for input(s): "AST", "ALT", "ALKPHOS", "BILITOT", "PROT", "ALBUMIN" in the last 168 hours. No results for input(s): "LIPASE", "AMYLASE" in the last 168 hours. No results for input(s): "AMMONIA" in the last 168 hours. Coagulation Profile: No results for input(s): "INR", "PROTIME" in the last 168 hours. Cardiac Enzymes: No results for input(s): "CKTOTAL", "CKMB", "CKMBINDEX", "TROPONINI" in the last 168 hours. BNP (last 3 results) No results for input(s): "PROBNP" in the last 8760 hours. HbA1C: No results for input(s): "HGBA1C" in the last 72 hours. CBG: No results for input(s): "GLUCAP" in the last 168 hours. Lipid Profile: No results for input(s): "CHOL", "HDL", "LDLCALC", "TRIG", "CHOLHDL", "LDLDIRECT" in the last 72 hours. Thyroid Function Tests: No results for input(s): "TSH",  "T4TOTAL", "FREET4", "T3FREE", "THYROIDAB" in the last 72 hours. Anemia Panel: No results for input(s): "VITAMINB12", "FOLATE", "FERRITIN", "TIBC", "IRON", "RETICCTPCT" in the last 72 hours. Sepsis Labs: No results for input(s): "PROCALCITON", "LATICACIDVEN" in the last 168 hours.  No results found for this or any previous visit (from the past 240 hour(s)).       Radiology Studies: DG Chest 2 View  Result Date: 09/29/2023 CLINICAL DATA:  Edema, history of CHF EXAM: CHEST - 2 VIEW COMPARISON:  02/23/2022 FINDINGS: Left chest cardiac device and lead in unchanged position. Mild cardiomegaly. Unchanged mediastinal contours. Prominence of the central vasculature without pulmonary edema. No focal pulmonary opacity. No pleural effusion or pneumothorax. No acute osseous abnormality. IMPRESSION: Mild cardiomegaly and prominence of the central vasculature without pulmonary edema. Electronically Signed   By: Wiliam Ke M.D.   On: 09/29/2023 14:17        Scheduled Meds:  allopurinol  200 mg Oral Daily   amiodarone  200 mg Oral Daily   apixaban  5 mg Oral BID   levothyroxine  50 mcg Oral QAC breakfast   mexiletine  150 mg Oral Q12H   pantoprazole  40 mg Oral BID   potassium chloride  40 mEq Oral Once   Continuous Infusions:  furosemide (LASIX) 200 mg in dextrose 5 % 100 mL (2 mg/mL) infusion 15 mg/hr (10/01/23 0021)     LOS: 1 day    Time spent: 45 minutes spent on chart review, discussion with nursing staff, consultants, updating family and interview/physical exam; more than 50% of that time was spent in counseling and/or coordination of care.    Joseph Art, DO [facility] Available via Epic secure chat 7am-7pm After these hours, please refer to coverage provider listed on [email or web address] 10/01/2023, 8:20 AM

## 2023-10-01 NOTE — Progress Notes (Signed)
Advanced Heart Failure Rounding Note   Subjective:    Diuresing on lasix gtt at 15. Has 3.9L out recorded   Weight recorded as up 2 pounds   Feeling better. Denies CP or SOB   Scr 2.46 - 2.09  Repeat echo pending   Objective:   Weight Range:  Vital Signs:   Temp:  [97.5 F (36.4 C)-98.9 F (37.2 C)] 98.9 F (37.2 C) (11/17 0755) Pulse Rate:  [66-84] 84 (11/17 0755) Resp:  [11-20] 19 (11/17 0755) BP: (102-135)/(64-99) 122/99 (11/17 0755) SpO2:  [93 %-100 %] 100 % (11/17 0755) Weight:  [136.5 kg-137.4 kg] 137.4 kg (11/17 0409)    Weight change: Filed Weights   09/30/23 1434 10/01/23 0409  Weight: (!) 136.5 kg (!) 137.4 kg    Intake/Output:   Intake/Output Summary (Last 24 hours) at 10/01/2023 9562 Last data filed at 10/01/2023 0800 Gross per 24 hour  Intake 223.66 ml  Output 2600 ml  Net -2376.34 ml     Physical Exam: General:  Obese male sitting in chair No resp difficulty HEENT: normal Neck: supple. Hard to see JVP Carotids 2+ bilat; no bruits. No lymphadenopathy or thryomegaly appreciated. Cor: Irregular rate & rhythm. No rubs, gallops or murmurs. Lungs: clear Abdomen: obese soft, nontender, + distended. No hepatosplenomegaly. No bruits or masses. Good bowel sounds. Extremities: no cyanosis, clubbing, rash, 3+ edema Neuro: alert & orientedx3, cranial nerves grossly intact. moves all 4 extremities w/o difficulty. Affect pleasant  Telemetry: AF 80s Personally reviewed   Labs: Basic Metabolic Panel: Recent Labs  Lab 09/29/23 1114 09/30/23 0518 10/01/23 0319  NA 134*  --  135  K 5.1  --  3.3*  CL 99  --  101  CO2 27  --  26  GLUCOSE 94  --  93  BUN 34*  --  28*  CREATININE 2.46*  --  2.09*  CALCIUM 8.3*  --  7.9*  MG 1.8 1.9  --     Liver Function Tests: No results for input(s): "AST", "ALT", "ALKPHOS", "BILITOT", "PROT", "ALBUMIN" in the last 168 hours. No results for input(s): "LIPASE", "AMYLASE" in the last 168 hours. No results  for input(s): "AMMONIA" in the last 168 hours.  CBC: Recent Labs  Lab 09/29/23 1114 09/30/23 0513 10/01/23 0319  WBC 9.2 9.0 10.2  NEUTROABS  --  7.5  --   HGB 13.8 13.9 13.7  HCT 44.3 44.3 43.1  MCV 86.4 85.2 85.0  PLT 323 310 302    Cardiac Enzymes: No results for input(s): "CKTOTAL", "CKMB", "CKMBINDEX", "TROPONINI" in the last 168 hours.  BNP: BNP (last 3 results) Recent Labs    04/18/23 1610 05/23/23 1618 09/30/23 0513  BNP 171.7* 209.9* 222.1*    ProBNP (last 3 results) No results for input(s): "PROBNP" in the last 8760 hours.    Other results:  Imaging: DG Chest 2 View  Result Date: 09/29/2023 CLINICAL DATA:  Edema, history of CHF EXAM: CHEST - 2 VIEW COMPARISON:  02/23/2022 FINDINGS: Left chest cardiac device and lead in unchanged position. Mild cardiomegaly. Unchanged mediastinal contours. Prominence of the central vasculature without pulmonary edema. No focal pulmonary opacity. No pleural effusion or pneumothorax. No acute osseous abnormality. IMPRESSION: Mild cardiomegaly and prominence of the central vasculature without pulmonary edema. Electronically Signed   By: Wiliam Ke M.D.   On: 09/29/2023 14:17     Medications:     Scheduled Medications:  allopurinol  200 mg Oral Daily   amiodarone  200 mg  Oral Daily   apixaban  5 mg Oral BID   levothyroxine  50 mcg Oral QAC breakfast   mexiletine  150 mg Oral Q12H   pantoprazole  40 mg Oral BID    Infusions:  furosemide (LASIX) 200 mg in dextrose 5 % 100 mL (2 mg/mL) infusion 15 mg/hr (10/01/23 0021)    PRN Medications: acetaminophen, ondansetron (ZOFRAN) IV, phenol, sodium chloride   Assessment/Plan:   1. 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 Ratio 1.26 read as equivocal.  SPEP negative - cMRI repeated (11/22): LVEF 43%, RVEF 44%, LGE concerning for sarcoid.  - Echo (4/23): EF 35-40% Moderate RV  dysfunction - Has seen geneticist, Dr. Jomarie Longs. Work up c/w LMNA.  - Admitted with NYHA IIIb symptoms and 40 pound weight gain - Increase lasix gtt to 25/hr. Add metolazone 5mg  x 1 - Restart Jardiance 10 - Restart spiro 25 - Has been off b-blocker due to bradycardia and heart block - Off ARNi and ARB (hypotension and N/V in the past) - can rechallenge as able - Update echo   2. Syncope/VT - 04/23 > secondary to VT - s/p ICD 4/23. - Continue mexiletine 150 mg bid   - Continue amiodarone 200 mg daiy - Followed by Dr. Ladona Ridgel  3. AKI on CKD Stage IIIb - Due to cardiorenal syndrome - Baseline SCr 1.7-2.0 - Admit Scr 2.46 -> 2.09 today - Folow with diuresis   4. Chronic AF/AFL - S/p AFL ablation 5/19 with Dr. Ladona Ridgel. - Continue Eliquis 5 mg bid. No bleeding issues. - Continue amiodarone 200 daily   5.  OSA - Continue CPAP.   6. PVCs/VT - Continue mexiletine + amiodarone. - Followed by EP   7. Obesity - Body mass index is Body mass index is 46.07 kg/m. - Consider GLP1RA  8. Hypokalemia - supp    Length of Stay: 1   Arvilla Meres MD 10/01/2023, 9:22 AM  Advanced Heart Failure Team Pager [phone] (M-F; 7a - 4p)  Please contact [facility] for night-coverage after hours (4p -7a ) and weekends on [email or web address]

## 2023-10-01 NOTE — Plan of Care (Signed)
  Problem: Clinical Measurements: Goal: Respiratory complications will improve Outcome: Completed/Met   Problem: Elimination: Goal: Will not experience complications related to bowel motility Outcome: Completed/Met Goal: Will not experience complications related to urinary retention Outcome: Completed/Met

## 2023-10-02 DIAGNOSIS — E8770 Fluid overload, unspecified: Secondary | ICD-10-CM | POA: Diagnosis not present

## 2023-10-02 DIAGNOSIS — I5023 Acute on chronic systolic (congestive) heart failure: Secondary | ICD-10-CM | POA: Diagnosis not present

## 2023-10-02 DIAGNOSIS — I5043 Acute on chronic combined systolic (congestive) and diastolic (congestive) heart failure: Secondary | ICD-10-CM | POA: Diagnosis not present

## 2023-10-02 LAB — MAGNESIUM: Magnesium: 2 mg/dL (ref 1.7–2.4)

## 2023-10-02 LAB — BASIC METABOLIC PANEL
Anion gap: 12 (ref 5–15)
Anion gap: 12 (ref 5–15)
BUN: 33 mg/dL — ABNORMAL HIGH (ref 6–20)
BUN: 35 mg/dL — ABNORMAL HIGH (ref 6–20)
CO2: 29 mmol/L (ref 22–32)
CO2: 30 mmol/L (ref 22–32)
Calcium: 8.4 mg/dL — ABNORMAL LOW (ref 8.9–10.3)
Calcium: 8.6 mg/dL — ABNORMAL LOW (ref 8.9–10.3)
Chloride: 97 mmol/L — ABNORMAL LOW (ref 98–111)
Chloride: 95 mmol/L — ABNORMAL LOW (ref 98–111)
Creatinine, Ser: 2.3 mg/dL — ABNORMAL HIGH (ref 0.61–1.24)
Creatinine, Ser: 2.19 mg/dL — ABNORMAL HIGH (ref 0.61–1.24)
GFR, Estimated: 36 mL/min — ABNORMAL LOW (ref >60)
GFR, Estimated: 38 mL/min — ABNORMAL LOW (ref >60)
Glucose, Bld: 102 mg/dL — ABNORMAL HIGH (ref 70–99)
Glucose, Bld: 104 mg/dL — ABNORMAL HIGH (ref 70–99)
Potassium: 2.9 mmol/L — ABNORMAL LOW (ref 3.5–5.1)
Potassium: 2.8 mmol/L — ABNORMAL LOW (ref 3.5–5.1)
Sodium: 138 mmol/L (ref 135–145)
Sodium: 137 mmol/L (ref 135–145)

## 2023-10-02 LAB — CBC
HCT: 48.5 % (ref 39.0–52.0)
Hemoglobin: 15.2 g/dL (ref 13.0–17.0)
MCH: 26.6 pg (ref 26.0–34.0)
MCHC: 31.3 g/dL (ref 30.0–36.0)
MCV: 84.9 fL (ref 80.0–100.0)
Platelets: 298 10*3/uL (ref 150–400)
RBC: 5.71 MIL/uL (ref 4.22–5.81)
RDW: 16.4 % — ABNORMAL HIGH (ref 11.5–15.5)
WBC: 8.3 10*3/uL (ref 4.0–10.5)
nRBC: 0 % (ref 0.0–0.2)

## 2023-10-02 MED ORDER — POTASSIUM CHLORIDE CRYS ER 20 MEQ PO TBCR
40.0000 meq | EXTENDED_RELEASE_TABLET | Freq: Once | ORAL | Status: DC
  Filled 2023-10-02: qty 2

## 2023-10-02 MED ORDER — POTASSIUM CHLORIDE CRYS ER 20 MEQ PO TBCR
40.0000 meq | EXTENDED_RELEASE_TABLET | Freq: Once | ORAL | Status: AC
  Administered 2023-10-02: 40 meq via ORAL
  Filled 2023-10-02: qty 2

## 2023-10-02 MED ORDER — METOLAZONE 5 MG PO TABS
5.0000 mg | ORAL_TABLET | Freq: Once | ORAL | Status: AC
  Administered 2023-10-02: 5 mg via ORAL
  Filled 2023-10-02: qty 1

## 2023-10-02 MED ORDER — POTASSIUM CHLORIDE CRYS ER 20 MEQ PO TBCR: 40.0000 meq | EXTENDED_RELEASE_TABLET | ORAL | Status: DC

## 2023-10-02 MED ORDER — POTASSIUM CHLORIDE CRYS ER 20 MEQ PO TBCR
60.0000 meq | EXTENDED_RELEASE_TABLET | ORAL | Status: AC
Start: 2023-10-02 — End: 2023-10-02
  Administered 2023-10-02 (×2): 60 meq via ORAL
  Filled 2023-10-02 (×2): qty 3

## 2023-10-02 NOTE — TOC Initial Note (Signed)
Transition of Care [facility]) - Initial/Assessment Note    Patient Details  Name: Bradley Powell MRN: [id number] Date of Birth: 10-Dec-1980  Transition of Care [facility]) CM/SW Contact:    Nicanor Bake Phone Number: [phone] 10/02/2023, 3:02 PM  Clinical Narrative:   HF CSW met wit pt at bedside. Pt stated that he lives with his parents. Pt stated that he has no history of HH services. Pt stated that he uses a CPAP machine. Pt stated that he works full time, but a this time does not need a work note. Pt stated that he has two insurances and for billing purposes he hopes that the billing department knows to bill both. CSW stated that a hospital follow up appointment will be scheduled closer to dc. Pt stated that his PCP knows that he is here and already has an upcoming appointment on December 10th and requested that an appointment not be scheduled for him.   TOC will continue following.                  Expected Discharge Plan: Home/Self Care Barriers to Discharge: Continued Medical Work up   Patient Goals and CMS Choice            Expected Discharge Plan and Services       Living arrangements for the past 2 months: Single Family Home                                      Prior Living Arrangements/Services Living arrangements for the past 2 months: Single Family Home Lives with:: Parents Patient language and need for interpreter reviewed:: Yes Do you feel safe going back to the place where you live?: Yes      Need for Family Participation in Patient Care: No (Comment) Care giver support system in place?: No (comment)   Criminal Activity/Legal Involvement Pertinent to Current Situation/Hospitalization: No - Comment as needed  Activities of Daily Living   ADL Screening (condition at time of admission) Independently performs ADLs?: Yes (appropriate for developmental age) Is the patient deaf or have difficulty hearing?: No Does the patient have difficulty  seeing, even when wearing glasses/contacts?: No Does the patient have difficulty concentrating, remembering, or making decisions?: No  Permission Sought/Granted                  Emotional Assessment Appearance:: Appears older than stated age Attitude/Demeanor/Rapport: Engaged Affect (typically observed): Appropriate Orientation: : Oriented to Self, Oriented to Place, Oriented to  Time, Oriented to Situation Alcohol / Substance Use: Not Applicable Psych Involvement: No (comment)  Admission diagnosis:  Acute on chronic combined systolic and diastolic congestive heart failure (HCC) [I50.43] Acute on chronic congestive heart failure (HCC) [I50.9] Hypervolemia, unspecified hypervolemia type [E87.70] Patient Active Problem List   Diagnosis Date Noted   Acute on chronic congestive heart failure (HCC) 09/30/2023   Fluid overload 09/29/2023   Ventricular tachycardia (HCC) 05/26/2022   ICD (implantable cardioverter-defibrillator) in place 05/26/2022   Syncope and collapse 02/15/2022   Acute on chronic systolic (congestive) heart failure (HCC) 02/15/2022   Hepatic cirrhosis (HCC) 06/16/2020   Dehydration with hyponatremia 06/16/2020   Hypotension due to hypovolemia 06/16/2020   Intractable nausea and vomiting 06/16/2020   Acute kidney injury (HCC) 06/15/2020   Paroxysmal atrial fibrillation (HCC) 04/29/2020   Chronic combined systolic and diastolic congestive heart failure (HCC) 04/07/2018   NICM (nonischemic cardiomyopathy) (  HCC) 04/07/2018   Hyperglycemia 04/07/2018   Typical atrial flutter (HCC) 04/06/2018   Paroxysmal atrial flutter (HCC) 04/06/2018   Gout 03/30/2017   Suspected sleep apnea 03/01/2017   Edema 02/24/2017   Testicular swelling 02/24/2017   CHF (congestive heart failure) (HCC) 02/24/2017   Overweight 02/24/2017   Costochondritis 02/24/2017   Sinus tachycardia 02/24/2017   First degree AV block 02/24/2017   Scrotal edema 02/24/2017   Morbid obesity (HCC)  02/24/2017   PCP:  [name], MD Pharmacy:   [facility] - [place], [place] - [phone], [address] [place] [address] Phone: [phone] Fax: [phone]  [id number] - [address], [place] - [address] [place] [address] Phone: [phone] Fax: [phone]  [facility] [phone] - [place], [place] - [address] [place] [phone] Phone: [phone] Fax: [phone]  [facility] [address] [place] [address] Phone: [phone] Fax: [phone]  [facility] - [place], [place] - [address] [place] [address] Phone: [phone] Fax: [phone]     Social Determinants of Health (SDOH) Social History: SDOH Screenings   Food Insecurity: No Food Insecurity (09/30/2023)  Housing: Low Risk  (09/30/2023)  Transportation Needs: No Transportation Needs (09/30/2023)  Utilities: Not At Risk (09/30/2023)  Tobacco Use: Low Risk  (09/29/2023)   SDOH Interventions:     Readmission Risk Interventions     No data to display

## 2023-10-02 NOTE — Consult Note (Signed)
[facility] Liaison Consult Note   10/02/2023  Bradley Powell August 24, 1981 [id number]  Insurance: [employer]   Primary Care Provider: [name], MD with Deboraha Sprang at [place], this provider is listed for the transition of care follow up appointments  and [name] calls   [facility] Liaison screened the patient remotely at [facility].     The patient was screened for hospitalization with noted medium risk score for unplanned readmission risk on unit rounds.  The patient was assessed for potential Community Care Coordination service needs for post hospital transition for care coordination. Review of patient's electronic medical record reveals patient is active with the Advanced HF team prior to admission.   Plan: [name] Liaison will continue to follow progress and disposition to asess for post hospital community care coordination/management needs.  Referral request for community care coordination: anticipate follow up with AHF and [name].   [facility] Community Care, Population Health does not replace or interfere with any arrangements made by the Inpatient Transition of Care team.   For questions contact:   Bradley Shanks, RN, BSN, CCM [place]  [facility], [facility] Liaison Direct Dial: [phone] or secure chat Email: [email or web address]

## 2023-10-02 NOTE — Progress Notes (Signed)
Patient declined CPAP use at this time. States he does use a CPAP at home but due to frequent bathroom use and hospital setting he declined use at this time. Patient aware to let Respiratory/staff know if he wishes to use.

## 2023-10-02 NOTE — Progress Notes (Signed)
PROGRESS NOTE    Bradley Powell  ZOX:[id number] DOB: Sep 05, 1981 DOA: 09/29/2023 PCP: [name], MD    Brief Narrative:  42 year old male with past medical history of paroxysmal atrial flutter s/p remote ablation, chronic combined systolic/diastolic CHF, NICM, OSA and morbid obesity.  He presents to the ER with complaints of increasing fluid retention on his legs up to his stomach.  Per patient his been compliant with his medication.  On lasix gtt directed by CHF team  Assessment and Plan: Acute on chronic systolic heart failure  EF 35-40%// Edema// ICD  -CHF order set initiated -Lasix gtt started and adjusted by CHF team (patient asked for a break today for a few hours so he can sleep -Strict I's and O's, daily weights -cardiology consult appreciated -down 11L   CKD stage 4 -Cr stable   Permanent atrial fibrillation -Amiodarone/ Eliquis    Hepatic cirrhosis (HCC) -Likely secondary to NASH   Morbid obesity (HCC)// OSA Estimated body mass index is 44.63 kg/m as calculated from the following:   Height as of this encounter: 5\' 8"  (1.727 m).   Weight as of this encounter: 133.1 kg.   Hypokalemia -replete aggressively -Mg normal  DVT prophylaxis:  apixaban (ELIQUIS) tablet 5 mg    Code Status: Full Code   Disposition Plan:  Level of care: Telemetry Cardiac Status is: Inpatient Remains inpatient appropriate     Consultants:  cards   Subjective:  Was up all night urinating  Objective: Vitals:   10/01/23 2035 10/01/23 2355 10/02/23 0251 10/02/23 0746  BP: 103/65 (!) 109/97 91/66 107/69  Pulse: 75 85  75  Resp: 19 19 19 16   Temp: 97.9 F (36.6 C) 97.6 F (36.4 C) 97.9 F (36.6 C) 97.6 F (36.4 C)  TempSrc: Axillary Oral Axillary Oral  SpO2: 100% 100% 100% 100%  Weight:   133.1 kg   Height:        Intake/Output Summary (Last 24 hours) at 10/02/2023 0827 Last data filed at 10/02/2023 0800 Gross per 24 hour  Intake 1624.88 ml  Output 81191 ml   Net -10475.12 ml   Filed Weights   09/30/23 1434 10/01/23 0409 10/02/23 0251  Weight: (!) 136.5 kg (!) 137.4 kg 133.1 kg    Examination:    General: Appearance:    Severely obese male in no acute distress     Lungs:      respirations unlabored  Heart:    Normal heart rate. + LE edema but improved  MS:   All extremities are intact.   Neurologic:   Awake, alert       Data Reviewed: I have personally reviewed following labs and imaging studies  CBC: Recent Labs  Lab 09/29/23 1114 09/30/23 0513 10/01/23 0319 10/02/23 0454  WBC 9.2 9.0 10.2 8.3  NEUTROABS  --  7.5  --   --   HGB 13.8 13.9 13.7 15.2  HCT 44.3 44.3 43.1 48.5  MCV 86.4 85.2 85.0 84.9  PLT 323 310 302 298   Basic Metabolic Panel: Recent Labs  Lab 09/29/23 1114 09/30/23 0518 10/01/23 0319 10/02/23 0454  NA 134*  --  135 138  K 5.1  --  3.3* 2.9*  CL 99  --  101 97*  CO2 27  --  26 29  GLUCOSE 94  --  93 102*  BUN 34*  --  28* 33*  CREATININE 2.46*  --  2.09* 2.30*  CALCIUM 8.3*  --  7.9* 8.4*  MG 1.8 1.9  --  2.0   GFR: Estimated Creatinine Clearance: 56.9 mL/min (A) (by C-G formula based on SCr of 2.3 mg/dL (H)). Liver Function Tests: No results for input(s): "AST", "ALT", "ALKPHOS", "BILITOT", "PROT", "ALBUMIN" in the last 168 hours. No results for input(s): "LIPASE", "AMYLASE" in the last 168 hours. No results for input(s): "AMMONIA" in the last 168 hours. Coagulation Profile: No results for input(s): "INR", "PROTIME" in the last 168 hours. Cardiac Enzymes: No results for input(s): "CKTOTAL", "CKMB", "CKMBINDEX", "TROPONINI" in the last 168 hours. BNP (last 3 results) No results for input(s): "PROBNP" in the last 8760 hours. HbA1C: No results for input(s): "HGBA1C" in the last 72 hours. CBG: No results for input(s): "GLUCAP" in the last 168 hours. Lipid Profile: No results for input(s): "CHOL", "HDL", "LDLCALC", "TRIG", "CHOLHDL", "LDLDIRECT" in the last 72 hours. Thyroid Function  Tests: No results for input(s): "TSH", "T4TOTAL", "FREET4", "T3FREE", "THYROIDAB" in the last 72 hours. Anemia Panel: No results for input(s): "VITAMINB12", "FOLATE", "FERRITIN", "TIBC", "IRON", "RETICCTPCT" in the last 72 hours. Sepsis Labs: No results for input(s): "PROCALCITON", "LATICACIDVEN" in the last 168 hours.  No results found for this or any previous visit (from the past 240 hour(s)).       Radiology Studies: No results found.      Scheduled Meds:  allopurinol  200 mg Oral Daily   amiodarone  200 mg Oral Daily   apixaban  5 mg Oral BID   empagliflozin  10 mg Oral Daily   levothyroxine  50 mcg Oral QAC breakfast   mexiletine  150 mg Oral Q12H   pantoprazole  40 mg Oral BID   potassium chloride  20 mEq Oral BID   spironolactone  25 mg Oral Daily   Continuous Infusions:  furosemide (LASIX) 200 mg in dextrose 5 % 100 mL (2 mg/mL) infusion Stopped (10/02/23 0800)     LOS: 2 days    Time spent: 45 minutes spent on chart review, discussion with nursing staff, consultants, updating family and interview/physical exam; more than 50% of that time was spent in counseling and/or coordination of care.    Joseph Art, DO [facility] Available via Epic secure chat 7am-7pm After these hours, please refer to coverage provider listed on [email or web address] 10/02/2023, 8:27 AM

## 2023-10-02 NOTE — Progress Notes (Signed)
Orthopedic Tech Progress Note Patient Details:  Bradley Powell 20-Dec-1980 [id number] Roland Rack Boots were applied tightly per patient's request.  Ortho Devices Type of Ortho Device: Radio broadcast assistant Ortho Device/Splint Location: Bi LE Ortho Device/Splint Interventions: Application   Post Interventions Patient Tolerated: Well  [name] 10/02/2023, 11:37 AM

## 2023-10-02 NOTE — Plan of Care (Signed)
  Problem: Health Behavior/Discharge Planning: Goal: Ability to manage health-related needs will improve Outcome: Progressing   Problem: Clinical Measurements: Goal: Diagnostic test results will improve Outcome: Progressing   

## 2023-10-02 NOTE — Progress Notes (Signed)
Patient expressed to RN that patient would like to pause the lasix gtt so "I can get some sleep". Vann MD at bedside and Shirlee Latch MD made aware on the unit, verbal order to hold lasix gtt until 1100.

## 2023-10-02 NOTE — Progress Notes (Addendum)
Advanced Heart Failure Rounding Note   Subjective:    Diuresed with lasix gtt at 25 + metolazone. Weight down 10 lbs. -11.8L UOP.   Scr 2.46 - 2.09>2.3  K 2.9  Repeat echo pending   Holding lasix gtt this morning until 11am so patient can get some rest. Feels ok just exhausted from being up all night going to the restroom.   Objective:   Weight Range:  Vital Signs:   Temp:  [97.4 F (36.3 C)-97.9 F (36.6 C)] 97.6 F (36.4 C) (11/18 0746) Pulse Rate:  [75-86] 75 (11/18 0746) Resp:  [16-19] 16 (11/18 0746) BP: (91-109)/(50-97) 107/69 (11/18 0746) SpO2:  [100 %] 100 % (11/18 0746) Weight:  [133.1 kg] 133.1 kg (11/18 0251) Last BM Date : 10/01/23  Weight change: Filed Weights   09/30/23 1434 10/01/23 0409 10/02/23 0251  Weight: (!) 136.5 kg (!) 137.4 kg 133.1 kg    Intake/Output:   Intake/Output Summary (Last 24 hours) at 10/02/2023 0928 Last data filed at 10/02/2023 0855 Gross per 24 hour  Intake 2104.88 ml  Output 16109 ml  Net -9995.12 ml     Physical Exam: General:  well appearing.  No respiratory difficulty HEENT: normal Neck: supple. JVD to ear. Carotids 2+ bilat; no bruits. No lymphadenopathy or thyromegaly appreciated. Cor: PMI nondisplaced. Regular rate & rhythm. No rubs, gallops or murmurs. Lungs: clear Abdomen: obese, soft, nontender, nondistended. No hepatosplenomegaly. No bruits or masses. Good bowel sounds. Extremities: no cyanosis, clubbing, rash, +2-3 BLE edema  Neuro: alert & oriented x 3, cranial nerves grossly intact. moves all 4 extremities w/o difficulty. Affect pleasant.   Telemetry: A Flutter 70s Personally reviewed   Labs: Basic Metabolic Panel: Recent Labs  Lab 09/29/23 1114 09/30/23 0518 10/01/23 0319 10/02/23 0454  NA 134*  --  135 138  K 5.1  --  3.3* 2.9*  CL 99  --  101 97*  CO2 27  --  26 29  GLUCOSE 94  --  93 102*  BUN 34*  --  28* 33*  CREATININE 2.46*  --  2.09* 2.30*  CALCIUM 8.3*  --  7.9* 8.4*  MG 1.8  1.9  --  2.0    Liver Function Tests: No results for input(s): "AST", "ALT", "ALKPHOS", "BILITOT", "PROT", "ALBUMIN" in the last 168 hours. No results for input(s): "LIPASE", "AMYLASE" in the last 168 hours. No results for input(s): "AMMONIA" in the last 168 hours.  CBC: Recent Labs  Lab 09/29/23 1114 09/30/23 0513 10/01/23 0319 10/02/23 0454  WBC 9.2 9.0 10.2 8.3  NEUTROABS  --  7.5  --   --   HGB 13.8 13.9 13.7 15.2  HCT 44.3 44.3 43.1 48.5  MCV 86.4 85.2 85.0 84.9  PLT 323 310 302 298    Cardiac Enzymes: No results for input(s): "CKTOTAL", "CKMB", "CKMBINDEX", "TROPONINI" in the last 168 hours.  BNP: BNP (last 3 results) Recent Labs    04/18/23 1610 05/23/23 1618 09/30/23 0513  BNP 171.7* 209.9* 222.1*    ProBNP (last 3 results) No results for input(s): "PROBNP" in the last 8760 hours.    Other results:  Imaging: No results found.   Medications:     Scheduled Medications:  allopurinol  200 mg Oral Daily   amiodarone  200 mg Oral Daily   apixaban  5 mg Oral BID   empagliflozin  10 mg Oral Daily   levothyroxine  50 mcg Oral QAC breakfast   mexiletine  150 mg Oral Q12H  pantoprazole  40 mg Oral BID   potassium chloride  20 mEq Oral BID   spironolactone  25 mg Oral Daily    Infusions:  furosemide (LASIX) 200 mg in dextrose 5 % 100 mL (2 mg/mL) infusion Stopped (10/02/23 0800)    PRN Medications: acetaminophen, ondansetron (ZOFRAN) IV, phenol, sodium chloride   Assessment/Plan:   1. 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 Ratio 1.26 read as equivocal.  SPEP negative - cMRI repeated (11/22): LVEF 43%, RVEF 44%, LGE concerning for sarcoid.  - Echo (4/23): EF 35-40% Moderate RV dysfunction - Has seen geneticist, Dr. Jomarie Longs. Work up c/w LMNA.  - Admitted with NYHA IIIb symptoms and 40 pound weight gain - Continue lasix gtt at 25/hr at 11am and repeat  metolazone 5mg  x 1 - Continue Jardiance 10 - Continue spiro 25 - Has been off b-blocker due to bradycardia and heart block - Off ARNi and ARB (hypotension and N/V in the past) - can rechallenge as able - Update echo   2. Syncope/VT - 04/23 > secondary to VT - s/p ICD 4/23. - Continue mexiletine 150 mg bid   - Continue amiodarone 200 mg daiy - Followed by Dr. Ladona Ridgel  3. AKI on CKD Stage IIIb - Due to cardiorenal syndrome - Baseline SCr 1.7-2.0 - Admit Scr 2.46 -> 2.09>2.3 today - Folow with diuresis   4. Chronic AF/AFL - S/p AFL ablation 5/19 with Dr. Ladona Ridgel. - Continue Eliquis 5 mg bid. No bleeding issues. - Continue amiodarone 200 daily   5.  OSA - Continue CPAP.   6. PVCs/VT - Continue mexiletine + amiodarone. - Followed by EP   7. Obesity - Body mass index is Body mass index is 44.63 kg/m. - Consider GLP1RA  8. Hypokalemia - supp    Length of Stay: 2   Alen Bleacher AGACNP-BC 10/02/2023, 9:28 AM  Advanced Heart Failure Team Pager [phone] (M-F; 7a - 4p)  Please contact [facility] for night-coverage after hours (4p -7a ) and weekends on [email or web address]   Patient seen with PA/NP, agree with the above note.   Subjective:  Tired from urinating all night. No complaints today.   Exam: General: NAD HEENT: Normal.   Lungs: Clear to auscultation bilaterally with normal respiratory effort. CV: Heart regular S1/S2, no murmur.  2+ peripheral edema to the thigh. JVP severely elevated Abdomen: Soft, , mildly distended.  Skin: Intact without lesions or rashes.  Neurologic: awake/alert, no gross FND.  Psych: Normal affect. Extremities: No clubbing or cyanosis.    A/P  Patient with LMNA associated cardiomyopathy with multiple recent admits for massive volume overload. He has responded extremely well to IV diuretics, though his renal function is borderline. Continues on lasix gtt, augmenting with metolazone, jardiance, and spironolactone.   Clearnce Hasten Advanced Heart Failure

## 2023-10-02 NOTE — Progress Notes (Signed)
Heart Failure Navigator Progress Note  Assessed for Heart & Vascular TOC clinic readiness.  Patient does not meet criteria due to Advanced Heart Failure Team patient of Dr. Bensimhon.   Navigator will sign off at this time.   Lucilia Yanni, BSN, RN Heart Failure Nurse Navigator Secure Chat Only   

## 2023-10-03 ENCOUNTER — Inpatient Hospital Stay (HOSPITAL_COMMUNITY): Payer: 59

## 2023-10-03 DIAGNOSIS — I5043 Acute on chronic combined systolic (congestive) and diastolic (congestive) heart failure: Secondary | ICD-10-CM | POA: Diagnosis not present

## 2023-10-03 DIAGNOSIS — I5021 Acute systolic (congestive) heart failure: Secondary | ICD-10-CM | POA: Diagnosis not present

## 2023-10-03 DIAGNOSIS — E8770 Fluid overload, unspecified: Secondary | ICD-10-CM | POA: Diagnosis not present

## 2023-10-03 DIAGNOSIS — I5023 Acute on chronic systolic (congestive) heart failure: Secondary | ICD-10-CM | POA: Diagnosis not present

## 2023-10-03 LAB — BASIC METABOLIC PANEL
Anion gap: 15 (ref 5–15)
Anion gap: 14 (ref 5–15)
BUN: 33 mg/dL — ABNORMAL HIGH (ref 6–20)
BUN: 35 mg/dL — ABNORMAL HIGH (ref 6–20)
CO2: 33 mmol/L — ABNORMAL HIGH (ref 22–32)
CO2: 28 mmol/L (ref 22–32)
Calcium: 9 mg/dL (ref 8.9–10.3)
Calcium: 8.7 mg/dL — ABNORMAL LOW (ref 8.9–10.3)
Chloride: 90 mmol/L — ABNORMAL LOW (ref 98–111)
Chloride: 91 mmol/L — ABNORMAL LOW (ref 98–111)
Creatinine, Ser: 2.08 mg/dL — ABNORMAL HIGH (ref 0.61–1.24)
Creatinine, Ser: 1.93 mg/dL — ABNORMAL HIGH (ref 0.61–1.24)
GFR, Estimated: 41 mL/min — ABNORMAL LOW (ref >60)
GFR, Estimated: 44 mL/min — ABNORMAL LOW (ref >60)
Glucose, Bld: 99 mg/dL (ref 70–99)
Glucose, Bld: 168 mg/dL — ABNORMAL HIGH (ref 70–99)
Potassium: 2.3 mmol/L — CL (ref 3.5–5.1)
Potassium: 3.4 mmol/L — ABNORMAL LOW (ref 3.5–5.1)
Sodium: 138 mmol/L (ref 135–145)
Sodium: 133 mmol/L — ABNORMAL LOW (ref 135–145)

## 2023-10-03 LAB — ECHOCARDIOGRAM COMPLETE
AR max vel: 2.41 cm2
AV Peak grad: 4.8 mm[Hg]
Ao pk vel: 1.1 m/s
Area-P 1/2: 5.84 cm2
Height: 68 in
MV M vel: 2.7 m/s
MV Peak grad: 29.2 mm[Hg]
S' Lateral: 3.8 cm
Weight: 4446.4 [oz_av]

## 2023-10-03 MED ORDER — FUROSEMIDE 10 MG/ML IJ SOLN
25.0000 mg/h | INTRAVENOUS | Status: DC
  Filled 2023-10-03: qty 20

## 2023-10-03 MED ORDER — PERFLUTREN LIPID MICROSPHERE
1.0000 mL | INTRAVENOUS | Status: AC | PRN
  Administered 2023-10-03: 3 mL via INTRAVENOUS

## 2023-10-03 MED ORDER — FUROSEMIDE 10 MG/ML IJ SOLN
160.0000 mg | Freq: Once | INTRAVENOUS | Status: AC
  Administered 2023-10-03: 160 mg via INTRAVENOUS
  Filled 2023-10-03: qty 4

## 2023-10-03 MED ORDER — POTASSIUM CHLORIDE 10 MEQ/100ML IV SOLN
10.0000 meq | INTRAVENOUS | Status: AC
Start: 2023-10-03 — End: 2023-10-03
  Administered 2023-10-03 (×2): 10 meq via INTRAVENOUS
  Filled 2023-10-03 (×3): qty 100

## 2023-10-03 MED ORDER — POTASSIUM CHLORIDE CRYS ER 20 MEQ PO TBCR
60.0000 meq | EXTENDED_RELEASE_TABLET | ORAL | Status: AC
  Administered 2023-10-03 – 2023-10-04 (×4): 60 meq via ORAL
  Filled 2023-10-03 (×4): qty 3

## 2023-10-03 MED ORDER — POTASSIUM CHLORIDE CRYS ER 20 MEQ PO TBCR
60.0000 meq | EXTENDED_RELEASE_TABLET | ORAL | Status: DC
  Administered 2023-10-03: 60 meq via ORAL
  Filled 2023-10-03: qty 3

## 2023-10-03 MED ORDER — POTASSIUM CHLORIDE 10 MEQ/100ML IV SOLN
10.0000 meq | Freq: Once | INTRAVENOUS | Status: AC
  Administered 2023-10-03: 10 meq via INTRAVENOUS
  Filled 2023-10-03: qty 100

## 2023-10-03 NOTE — Progress Notes (Signed)
Echocardiogram 2D Echocardiogram has been performed.  Bradley Powell 10/03/2023, 12:10 PM

## 2023-10-03 NOTE — Progress Notes (Signed)
PROGRESS NOTE    Bradley Powell  ZOX:[id number] DOB: 05-19-1981 DOA: 09/29/2023 PCP: [name], MD    Brief Narrative:  42 year old male with past medical history of paroxysmal atrial flutter s/p remote ablation, chronic combined systolic/diastolic CHF, NICM, OSA and morbid obesity.  He presents to the ER with complaints of increasing fluid retention on his legs up to his stomach.  Per patient his been compliant with his medication.  On lasix gtt directed by CHF team.    Assessment and Plan: Acute on chronic systolic heart failure  EF 35-40%// Edema// ICD  -CHF order set initiated -Lasix gtt started and adjusted by CHF team  -Strict I's and O's, daily weights -cardiology consult appreciated -down 25L overall and 16lbs   CKD stage 4 -Cr stable   Permanent atrial fibrillation -Amiodarone/ Eliquis    Hepatic cirrhosis (HCC) -Likely secondary to NASH   Morbid obesity (HCC)// OSA Estimated body mass index is 42.25 kg/m as calculated from the following:   Height as of this encounter: 5\' 8"  (1.727 m).   Weight as of this encounter: 126.1 kg.   Hypokalemia -replete aggressively -Mg normal  DVT prophylaxis:  apixaban (ELIQUIS) tablet 5 mg    Code Status: Full Code   Disposition Plan:  Level of care: Telemetry Cardiac Status is: Inpatient Remains inpatient appropriate     Consultants:  cards   Subjective:  Still with LE swelling  Objective: Vitals:   10/02/23 1928 10/03/23 0055 10/03/23 0413 10/03/23 0740  BP: (!) 96/59 108/75 110/77 91/66  Pulse: 73 71 71 66  Resp: 20 20 18 20   Temp: 97.6 F (36.4 C) 97.6 F (36.4 C) (!) 97.5 F (36.4 C) (!) 97.4 F (36.3 C)  TempSrc: Oral Oral Oral Oral  SpO2: 100% 100% 100% 99%  Weight:   126.1 kg   Height:        Intake/Output Summary (Last 24 hours) at 10/03/2023 1103 Last data filed at 10/03/2023 0855 Gross per 24 hour  Intake 1435.82 ml  Output 81191 ml  Net -10189.18 ml   Filed Weights    10/01/23 0409 10/02/23 0251 10/03/23 0413  Weight: (!) 137.4 kg 133.1 kg 126.1 kg    Examination:   General: Appearance:    Severely obese male in no acute distress     Lungs:     respirations unlabored  Heart:    Normal heart rate.   MS:   All extremities are intact.   Neurologic:   Awake, alert       Data Reviewed: I have personally reviewed following labs and imaging studies  CBC: Recent Labs  Lab 09/29/23 1114 09/30/23 0513 10/01/23 0319 10/02/23 0454  WBC 9.2 9.0 10.2 8.3  NEUTROABS  --  7.5  --   --   HGB 13.8 13.9 13.7 15.2  HCT 44.3 44.3 43.1 48.5  MCV 86.4 85.2 85.0 84.9  PLT 323 310 302 298   Basic Metabolic Panel: Recent Labs  Lab 09/29/23 1114 09/30/23 0518 10/01/23 0319 10/02/23 0454 10/02/23 1454 10/03/23 0410  NA 134*  --  135 138 137 138  K 5.1  --  3.3* 2.9* 2.8* 2.3*  CL 99  --  101 97* 95* 90*  CO2 27  --  26 29 30  33*  GLUCOSE 94  --  93 102* 104* 99  BUN 34*  --  28* 33* 35* 33*  CREATININE 2.46*  --  2.09* 2.30* 2.19* 2.08*  CALCIUM 8.3*  --  7.9* 8.4*  8.6* 9.0  MG 1.8 1.9  --  2.0  --   --    GFR: Estimated Creatinine Clearance: 61.1 mL/min (A) (by C-G formula based on SCr of 2.08 mg/dL (H)). Liver Function Tests: No results for input(s): "AST", "ALT", "ALKPHOS", "BILITOT", "PROT", "ALBUMIN" in the last 168 hours. No results for input(s): "LIPASE", "AMYLASE" in the last 168 hours. No results for input(s): "AMMONIA" in the last 168 hours. Coagulation Profile: No results for input(s): "INR", "PROTIME" in the last 168 hours. Cardiac Enzymes: No results for input(s): "CKTOTAL", "CKMB", "CKMBINDEX", "TROPONINI" in the last 168 hours. BNP (last 3 results) No results for input(s): "PROBNP" in the last 8760 hours. HbA1C: No results for input(s): "HGBA1C" in the last 72 hours. CBG: No results for input(s): "GLUCAP" in the last 168 hours. Lipid Profile: No results for input(s): "CHOL", "HDL", "LDLCALC", "TRIG", "CHOLHDL", "LDLDIRECT"  in the last 72 hours. Thyroid Function Tests: No results for input(s): "TSH", "T4TOTAL", "FREET4", "T3FREE", "THYROIDAB" in the last 72 hours. Anemia Panel: No results for input(s): "VITAMINB12", "FOLATE", "FERRITIN", "TIBC", "IRON", "RETICCTPCT" in the last 72 hours. Sepsis Labs: No results for input(s): "PROCALCITON", "LATICACIDVEN" in the last 168 hours.  No results found for this or any previous visit (from the past 240 hour(s)).       Radiology Studies: No results found.      Scheduled Meds:  allopurinol  200 mg Oral Daily   amiodarone  200 mg Oral Daily   apixaban  5 mg Oral BID   empagliflozin  10 mg Oral Daily   levothyroxine  50 mcg Oral QAC breakfast   mexiletine  150 mg Oral Q12H   pantoprazole  40 mg Oral BID   potassium chloride  60 mEq Oral Q4H   spironolactone  25 mg Oral Daily   Continuous Infusions:  furosemide (LASIX) 200 mg in dextrose 5 % 100 mL (2 mg/mL) infusion       LOS: 3 days    Time spent: 45 minutes spent on chart review, discussion with nursing staff, consultants, updating family and interview/physical exam; more than 50% of that time was spent in counseling and/or coordination of care.    Joseph Art, DO [facility] Available via Epic secure chat 7am-7pm After these hours, please refer to coverage provider listed on [email or web address] 10/03/2023, 11:03 AM

## 2023-10-03 NOTE — Progress Notes (Signed)
Echocardiogram 2D Echocardiogram has been performed.  Lucendia Herrlich 10/03/2023, 12:11 PM

## 2023-10-03 NOTE — Plan of Care (Signed)
  Problem: Health Behavior/Discharge Planning: Goal: Ability to manage health-related needs will improve Outcome: Progressing   Problem: Clinical Measurements: Goal: Ability to maintain clinical measurements within normal limits will improve Outcome: Progressing Goal: Diagnostic test results will improve Outcome: Progressing   

## 2023-10-03 NOTE — Progress Notes (Addendum)
Advanced Heart Failure Rounding Note   Subjective:    Diuresed with lasix gtt at 25 + metolazone. Weight down 16 lbs, overall 26 lbs. -10.7L UOP.   Scr 2.46 - 2.09>2.3>2.08  K 2.3  Echo pending   Feels ok but still not happy with the edema in his legs.   Objective:   Weight Range:  Vital Signs:   Temp:  [97.5 F (36.4 C)-98.5 F (36.9 C)] 97.5 F (36.4 C) (11/19 0413) Pulse Rate:  [71-75] 71 (11/19 0413) Resp:  [16-20] 18 (11/19 0413) BP: (95-110)/(59-77) 110/77 (11/19 0413) SpO2:  [100 %] 100 % (11/19 0413) Weight:  [126.1 kg] 126.1 kg (11/19 0413) Last BM Date : 10/01/23  Weight change: Filed Weights   10/01/23 0409 10/02/23 0251 10/03/23 0413  Weight: (!) 137.4 kg 133.1 kg 126.1 kg    Intake/Output:   Intake/Output Summary (Last 24 hours) at 10/03/2023 0726 Last data filed at 10/03/2023 0724 Gross per 24 hour  Intake 1558.82 ml  Output 62130 ml  Net -10566.18 ml     Physical Exam: General:  well appearing.  No respiratory difficulty HEENT: normal Neck: supple. JVD ~15 cm. Carotids 2+ bilat; no bruits. No lymphadenopathy or thyromegaly appreciated. Cor: PMI nondisplaced. Regular rate & irregular rhythm. No rubs, gallops or murmurs. Lungs: clear Abdomen: obese, soft, nontender, nondistended. No hepatosplenomegaly. No bruits or masses. Good bowel sounds. Extremities: no cyanosis, clubbing, rash, +2 BLE edema to thigh  Neuro: alert & oriented x 3, cranial nerves grossly intact. moves all 4 extremities w/o difficulty. Affect pleasant.   Telemetry: A Flutter 60s-70s (Personally reviewed)     Labs: Basic Metabolic Panel: Recent Labs  Lab 09/29/23 1114 09/30/23 0518 10/01/23 0319 10/02/23 0454 10/02/23 1454 10/03/23 0410  NA 134*  --  135 138 137 138  K 5.1  --  3.3* 2.9* 2.8* 2.3*  CL 99  --  101 97* 95* 90*  CO2 27  --  26 29 30  33*  GLUCOSE 94  --  93 102* 104* 99  BUN 34*  --  28* 33* 35* 33*  CREATININE 2.46*  --  2.09* 2.30* 2.19*  2.08*  CALCIUM 8.3*  --  7.9* 8.4* 8.6* 9.0  MG 1.8 1.9  --  2.0  --   --     Liver Function Tests: No results for input(s): "AST", "ALT", "ALKPHOS", "BILITOT", "PROT", "ALBUMIN" in the last 168 hours. No results for input(s): "LIPASE", "AMYLASE" in the last 168 hours. No results for input(s): "AMMONIA" in the last 168 hours.  CBC: Recent Labs  Lab 09/29/23 1114 09/30/23 0513 10/01/23 0319 10/02/23 0454  WBC 9.2 9.0 10.2 8.3  NEUTROABS  --  7.5  --   --   HGB 13.8 13.9 13.7 15.2  HCT 44.3 44.3 43.1 48.5  MCV 86.4 85.2 85.0 84.9  PLT 323 310 302 298    Cardiac Enzymes: No results for input(s): "CKTOTAL", "CKMB", "CKMBINDEX", "TROPONINI" in the last 168 hours.  BNP: BNP (last 3 results) Recent Labs    04/18/23 1610 05/23/23 1618 09/30/23 0513  BNP 171.7* 209.9* 222.1*    ProBNP (last 3 results) No results for input(s): "PROBNP" in the last 8760 hours.    Other results:  Imaging: No results found.   Medications:     Scheduled Medications:  allopurinol  200 mg Oral Daily   amiodarone  200 mg Oral Daily   apixaban  5 mg Oral BID   empagliflozin  10 mg Oral  Daily   levothyroxine  50 mcg Oral QAC breakfast   mexiletine  150 mg Oral Q12H   pantoprazole  40 mg Oral BID   potassium chloride  20 mEq Oral BID   potassium chloride  60 mEq Oral Q4H   spironolactone  25 mg Oral Daily    Infusions:  furosemide (LASIX) 200 mg in dextrose 5 % 100 mL (2 mg/mL) infusion 25 mg/hr (10/03/23 0600)   potassium chloride Stopped (10/03/23 0649)    PRN Medications: acetaminophen, ondansetron (ZOFRAN) IV, phenol, sodium chloride   Assessment/Plan:   1. 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 Ratio 1.26 read as equivocal.  SPEP negative - cMRI repeated (11/22): LVEF 43%, RVEF 44%, LGE concerning for sarcoid.  - Echo (4/23): EF 35-40% Moderate RV dysfunction - Has seen  geneticist, Dr. Jomarie Longs. Work up c/w LMNA.  - Admitted with NYHA IIIb symptoms and 40 pound weight gain - Stop lasix gtt. Need to get K up before giving more diuretic. Plan to check BMET around 2 after he completes his K runs. Plan for 160 mg IV lasix later today. No more metolazone. Overall down 26lbs.  - Continue Jardiance 10 - Continue spiro 25 - Has been off b-blocker due to bradycardia and heart block - Off ARNi and ARB (hypotension and N/V in the past) - can rechallenge as able - Update echo   2. Syncope/VT - 04/23 > secondary to VT - s/p ICD 4/23. - Continue mexiletine 150 mg bid   - Continue amiodarone 200 mg daiy - Followed by Dr. Ladona Ridgel  3. AKI on CKD Stage IIIb - Due to cardiorenal syndrome - Baseline SCr 1.7-2.0 - Admit Scr 2.46 -> 2.09>2.3>2.08 today - Folow with diuresis   4. Chronic AF/AFL - S/p AFL ablation 5/19 with Dr. Ladona Ridgel. - Continue Eliquis 5 mg bid. No bleeding issues. - Continue amiodarone 200 daily   5.  OSA - Continue CPAP.   6. PVCs/VT - Continue mexiletine + amiodarone. - Followed by EP   7. Obesity - Body mass index is Body mass index is 42.25 kg/m. - Consider GLP1RA  8. Hypokalemia - supp aggressively  - holding diuresis until there is improvement  - BMET at noon    Length of Stay: 3   Alen Bleacher AGACNP-BC 10/03/2023, 7:26 AM  Advanced Heart Failure Team Pager [phone] (M-F; 7a - 4p)  Please contact [facility] for night-coverage after hours (4p -7a ) and weekends on [email or web address]

## 2023-10-03 NOTE — Progress Notes (Signed)
   10/03/23 2331  BiPAP/CPAP/SIPAP  BiPAP/CPAP/SIPAP Pt Type Adult  Reason BIPAP/CPAP not in use Non-compliant (PT refused tv for the night)

## 2023-10-04 ENCOUNTER — Encounter (HOSPITAL_COMMUNITY): Payer: 59

## 2023-10-04 ENCOUNTER — Inpatient Hospital Stay (HOSPITAL_COMMUNITY): Payer: 59

## 2023-10-04 DIAGNOSIS — I5023 Acute on chronic systolic (congestive) heart failure: Secondary | ICD-10-CM | POA: Diagnosis not present

## 2023-10-04 LAB — BASIC METABOLIC PANEL
Anion gap: 19 — ABNORMAL HIGH (ref 5–15)
Anion gap: 14 (ref 5–15)
BUN: 39 mg/dL — ABNORMAL HIGH (ref 6–20)
BUN: 35 mg/dL — ABNORMAL HIGH (ref 6–20)
CO2: 26 mmol/L (ref 22–32)
CO2: 34 mmol/L — ABNORMAL HIGH (ref 22–32)
Calcium: 9.2 mg/dL (ref 8.9–10.3)
Calcium: 8.9 mg/dL (ref 8.9–10.3)
Chloride: 93 mmol/L — ABNORMAL LOW (ref 98–111)
Chloride: 90 mmol/L — ABNORMAL LOW (ref 98–111)
Creatinine, Ser: 2.39 mg/dL — ABNORMAL HIGH (ref 0.61–1.24)
Creatinine, Ser: 2.56 mg/dL — ABNORMAL HIGH (ref 0.61–1.24)
GFR, Estimated: 34 mL/min — ABNORMAL LOW (ref >60)
GFR, Estimated: 32 mL/min — ABNORMAL LOW (ref >60)
Glucose, Bld: 84 mg/dL (ref 70–99)
Glucose, Bld: 128 mg/dL — ABNORMAL HIGH (ref 70–99)
Potassium: 4.9 mmol/L (ref 3.5–5.1)
Potassium: 3.4 mmol/L — ABNORMAL LOW (ref 3.5–5.1)
Sodium: 138 mmol/L (ref 135–145)
Sodium: 138 mmol/L (ref 135–145)

## 2023-10-04 LAB — HEPATIC FUNCTION PANEL
ALT: 43 U/L (ref 0–44)
AST: 60 U/L — ABNORMAL HIGH (ref 15–41)
Albumin: 2.5 g/dL — ABNORMAL LOW (ref 3.5–5.0)
Alkaline Phosphatase: 202 U/L — ABNORMAL HIGH (ref 38–126)
Bilirubin, Direct: 0.7 mg/dL — ABNORMAL HIGH (ref 0.0–0.2)
Indirect Bilirubin: 2.1 mg/dL — ABNORMAL HIGH (ref 0.3–0.9)
Total Bilirubin: 2.8 mg/dL — ABNORMAL HIGH (ref <1.2)
Total Protein: 6.6 g/dL (ref 6.5–8.1)

## 2023-10-04 LAB — MAGNESIUM: Magnesium: 2 mg/dL (ref 1.7–2.4)

## 2023-10-04 MED ORDER — ACETAMINOPHEN 325 MG PO TABS
650.0000 mg | ORAL_TABLET | Freq: Four times a day (QID) | ORAL | Status: DC | PRN
  Administered 2023-10-05 – 2023-10-09 (×6): 650 mg via ORAL
  Filled 2023-10-04 (×8): qty 2

## 2023-10-04 MED ORDER — POTASSIUM CHLORIDE CRYS ER 20 MEQ PO TBCR
40.0000 meq | EXTENDED_RELEASE_TABLET | ORAL | Status: AC
Start: 2023-10-04 — End: 2023-10-05
  Administered 2023-10-04 – 2023-10-05 (×2): 40 meq via ORAL
  Filled 2023-10-04 (×2): qty 2

## 2023-10-04 MED ORDER — FUROSEMIDE 10 MG/ML IJ SOLN
25.0000 mg/h | INTRAVENOUS | Status: DC
  Administered 2023-10-04 – 2023-10-05 (×2): 25 mg/h via INTRAVENOUS
  Filled 2023-10-04 (×3): qty 20

## 2023-10-04 MED ORDER — FUROSEMIDE 10 MG/ML IJ SOLN
160.0000 mg | Freq: Two times a day (BID) | INTRAVENOUS | Status: DC
  Administered 2023-10-04: 160 mg via INTRAVENOUS
  Filled 2023-10-04 (×2): qty 16

## 2023-10-04 MED ORDER — BACLOFEN 10 MG PO TABS
10.0000 mg | ORAL_TABLET | Freq: Two times a day (BID) | ORAL | Status: DC | PRN
  Administered 2023-10-22: 10 mg via ORAL
  Filled 2023-10-04 (×2): qty 1

## 2023-10-04 MED ORDER — POTASSIUM CHLORIDE 20 MEQ PO PACK: 40.0000 meq | PACK | Freq: Once | ORAL | Status: DC

## 2023-10-04 MED ORDER — BACLOFEN 10 MG PO TABS
5.0000 mg | ORAL_TABLET | Freq: Two times a day (BID) | ORAL | Status: DC | PRN
  Administered 2023-10-04: 5 mg via ORAL
  Filled 2023-10-04: qty 1

## 2023-10-04 MED ORDER — POTASSIUM CHLORIDE CRYS ER 20 MEQ PO TBCR
60.0000 meq | EXTENDED_RELEASE_TABLET | ORAL | Status: DC
Start: 2023-10-04 — End: 2023-10-04

## 2023-10-04 NOTE — Progress Notes (Signed)
   10/04/23 0811  Vitals  BP 93/60  MAP (mmHg) 69  BP Location Right Arm  BP Method Automatic  Patient Position (if appropriate) Lying  Pulse Rate (!) 109  ECG Heart Rate (!) 110  Resp 16  Level of Consciousness  Level of Consciousness Alert  MEWS COLOR  MEWS Score Color Yellow  Oxygen Therapy  SpO2 97 %  O2 Device Room Air  MEWS Score  MEWS Temp 0  MEWS Systolic 1  MEWS Pulse 1  MEWS RR 0  MEWS LOC 0  MEWS Score 2   Patient ambulated from bed to bathroom during this elevated HR episode. Patient does have A flutter and will have DCCV 11/21. RN administered PO daily meds.

## 2023-10-04 NOTE — Progress Notes (Signed)
Patient febrile tonight with temperature as high as 101.9 F.  Most recent vital signs: heart rate 100, respiratory rate 18, blood pressure 91/53, and SpO2 96% on room air.  Chart reviewed, 42 year old male with history of permanent A-fib/flutter, CHF, CKD stage IIIb, NICM, hepatic cirrhosis, OSA, morbid obesity admitted for acute on chronic systolic heart failure which is being managed by cardiology.  Temperature was 100.1 F earlier today at 7:35 AM and patient was slightly tachycardic at that time. No obvious infectious source.    Workup ordered including chest x-ray, UA, CBC, lactate and currently pending.  Blood cultures ordered.  Continue Tylenol as needed for fevers.  Addendum/update: Chest x-ray not suggestive of pneumonia.  UA not suggestive of infection.  Lactate normal.  Does have leukocytosis (WBC count 21.6 today and was previously 8.3 on labs 3 days ago).  Start broad-spectrum antibiotics including vancomycin, cefepime, and metronidazole for now.  Follow-up blood cultures.  Will also add on urine culture.

## 2023-10-04 NOTE — TOC Progression Note (Signed)
Transition of Care [facility]) - Progression Note    Patient Details  Name: Bradley Powell MRN: [id number] Date of Birth: 1981/09/04  Transition of Care [facility]) CM/SW Contact  Nicanor Bake Phone Number: [phone] 10/04/2023, 2:34 PM  Clinical Narrative:  HF CSW met with pt at bedside. Pt appeared to be sad or distressed. CSW inquired with pt to see how he was feeling. Pt stated that he is hoping that billing bills both of his insurances so he does not have a huge medical bill. Pt stated that he has developed anxiety in the past about reoccurring medical visits, medical bills, and frustration about his health. CSW and pt discussed pts benefits through [employer] and discussed the EAP program. CSW encouraged the pt to take advantage of his resources. CSW spent some time with pt helping him reframe his thinking.   TOC will continue following.      Expected Discharge Plan: Home/Self Care Barriers to Discharge: Continued Medical Work up  Expected Discharge Plan and Services       Living arrangements for the past 2 months: Single Family Home                                       Social Determinants of Health (SDOH) Interventions SDOH Screenings   Food Insecurity: No Food Insecurity (09/30/2023)  Housing: Low Risk  (09/30/2023)  Transportation Needs: No Transportation Needs (09/30/2023)  Utilities: Not At Risk (09/30/2023)  Tobacco Use: Low Risk  (09/29/2023)    Readmission Risk Interventions     No data to display

## 2023-10-04 NOTE — Progress Notes (Signed)
   10/04/23 2227  BiPAP/CPAP/SIPAP  Reason BIPAP/CPAP not in use Non-compliant  BiPAP/CPAP /SiPAP Vitals  Pulse Rate 96  Resp 18  SpO2 97 %  Bilateral Breath Sounds Clear;Diminished  MEWS Score/Color  MEWS Score 2  MEWS Score Color Yellow

## 2023-10-04 NOTE — Progress Notes (Signed)
   10/04/23 1700  Pain Assessment  Pain Scale 0-10  Pain Score 7  Pain Type Acute pain  Pain Location Leg  Pain Orientation Right;Left;Lower  Pain Descriptors / Indicators Sore;Aching  Pain Frequency Constant  Pain Onset With Activity  Pain Intervention(s) MD notified (Comment)   1730: Patient does not have prn med reflecting pain range. RN notified MD. RN made patient aware.   1810: Patient headache is still currently a 7/10. No new orders.

## 2023-10-04 NOTE — Progress Notes (Signed)
PROGRESS NOTE    Bradley Powell  ZOX:[id number] DOB: 1981/01/25 DOA: 09/29/2023 PCP: [name], MD   Brief Narrative:  This 42 year old male with past medical history of paroxysmal atrial flutter s/p remote ablation, chronic combined systolic/diastolic CHF, NICM, OSA and morbid obesity.  He presents to the ER with complaints of increasing fluid retention on his legs up to his stomach.  Per patient his been compliant with his medications.  On lasix gtt directed by CHF team.  Cardiology is following.  Patient is scheduled to have DCCV tomorrow for A-fib.   Assessment & Plan:   Principal Problem:   Acute on chronic congestive heart failure (HCC) Active Problems:   Hepatic cirrhosis (HCC)   Edema   Morbid obesity (HCC)   Gout   NICM (nonischemic cardiomyopathy) (HCC)   Paroxysmal atrial fibrillation (HCC)   Acute on chronic systolic (congestive) heart failure (HCC)   ICD (implantable cardioverter-defibrillator) in place   Fluid overload  Acute on chronic systolic heart failure: Echo >  LVEF 35-40%.  Patient is started on Lasix gtt. as per CHF protocol. Medications adjusted by CHF team. Strict I's and O's, daily weights Cardiology consult appreciated. Down 25L overall and 16lbs.   CKD stage 4 Serum creatinine around baseline.   Permanent atrial fibrillation Continue Amiodarone/ Eliquis. Scheduled for DCCV tomorrow.   Hepatic cirrhosis (HCC) Likely secondary to NASH.   Morbid obesity (HCC)// OSA Estimated body mass index is 42.25 kg/m as calculated from the following:   Height as of this encounter: 5\' 8"  (1.727 m).   Weight as of this encounter: 126.1 kg.    Hypokalemia Replace aggressively. -Mg normal   DVT prophylaxis: Eliquis Code Status: Full code Family Communication: No family at bedside Disposition Plan:   Admitted for acute on chronic systolic CHF.  Cardiology is consulted,  requiring IV diuresis. Also found to have A-fib, requiring DCCV  tomorrow  Consultants:  Cardiology  Procedures:  Echocardiogram.  Antimicrobials: Anti-infectives (From admission, onward)    None       Subjective: Patient was seen and examined at bedside.  Overnight events noted.   Patient is morbidly obese,  severely deconditioned.  Patient remains on supplemental oxygen. He reports feeling better but still has shortness of breath while talking.  Objective: Vitals:   10/04/23 0456 10/04/23 0735 10/04/23 0811 10/04/23 1125  BP:  108/60 93/60 (!) 104/52  Pulse:  (!) 109 (!) 109 (!) 105  Resp:  20 16 20   Temp:  100.1 F (37.8 C)  99.3 F (37.4 C)  TempSrc:  Oral  Oral  SpO2:  97% 97% 97%  Weight: 126.6 kg     Height:        Intake/Output Summary (Last 24 hours) at 10/04/2023 1328 Last data filed at 10/04/2023 1238 Gross per 24 hour  Intake 2053.69 ml  Output 4225 ml  Net -2171.31 ml   Filed Weights   10/02/23 0251 10/03/23 0413 10/04/23 0456  Weight: 133.1 kg 126.1 kg 126.6 kg    Examination:  General exam: Appears calm and comfortable, overly obese, deconditioned Respiratory system: Clear to auscultation. Respiratory effort normal.  RR 15 Cardiovascular system: S1 & S2 heard, irregular rhythm, no murmur . Gastrointestinal system: Abdomen is nondistended, soft and non tender. Normal bowel sounds heard. Central nervous system: Alert and oriented x3. No focal neurological deficits. Extremities: Edema+, no cyanosis, no clubbing Skin: No rashes, lesions or ulcers Psychiatry: Judgement and insight appear normal. Mood & affect appropriate.  Data Reviewed: I have personally reviewed following labs and imaging studies  CBC: Recent Labs  Lab 09/29/23 1114 09/30/23 0513 10/01/23 0319 10/02/23 0454  WBC 9.2 9.0 10.2 8.3  NEUTROABS  --  7.5  --   --   HGB 13.8 13.9 13.7 15.2  HCT 44.3 44.3 43.1 48.5  MCV 86.4 85.2 85.0 84.9  PLT 323 310 302 298   Basic Metabolic Panel: Recent Labs  Lab 09/29/23 1114 09/30/23 0518  10/01/23 0319 10/02/23 0454 10/02/23 1454 10/03/23 0410 10/03/23 1330 10/04/23 0400  NA 134*  --    < > 138 137 138 133* 138  K 5.1  --    < > 2.9* 2.8* 2.3* 3.4* 4.9  CL 99  --    < > 97* 95* 90* 91* 93*  CO2 27  --    < > 29 30 33* 28 26  GLUCOSE 94  --    < > 102* 104* 99 168* 84  BUN 34*  --    < > 33* 35* 33* 35* 39*  CREATININE 2.46*  --    < > 2.30* 2.19* 2.08* 1.93* 2.39*  CALCIUM 8.3*  --    < > 8.4* 8.6* 9.0 8.7* 9.2  MG 1.8 1.9  --  2.0  --   --   --  2.0   < > = values in this interval not displayed.   GFR: Estimated Creatinine Clearance: 53.3 mL/min (A) (by C-G formula based on SCr of 2.39 mg/dL (H)). Liver Function Tests: Recent Labs  Lab 10/04/23 0400  AST 60*  ALT 43  ALKPHOS 202*  BILITOT 2.8*  PROT 6.6  ALBUMIN 2.5*   No results for input(s): "LIPASE", "AMYLASE" in the last 168 hours. No results for input(s): "AMMONIA" in the last 168 hours. Coagulation Profile: No results for input(s): "INR", "PROTIME" in the last 168 hours. Cardiac Enzymes: No results for input(s): "CKTOTAL", "CKMB", "CKMBINDEX", "TROPONINI" in the last 168 hours. BNP (last 3 results) No results for input(s): "PROBNP" in the last 8760 hours. HbA1C: No results for input(s): "HGBA1C" in the last 72 hours. CBG: No results for input(s): "GLUCAP" in the last 168 hours. Lipid Profile: No results for input(s): "CHOL", "HDL", "LDLCALC", "TRIG", "CHOLHDL", "LDLDIRECT" in the last 72 hours. Thyroid Function Tests: No results for input(s): "TSH", "T4TOTAL", "FREET4", "T3FREE", "THYROIDAB" in the last 72 hours. Anemia Panel: No results for input(s): "VITAMINB12", "FOLATE", "FERRITIN", "TIBC", "IRON", "RETICCTPCT" in the last 72 hours. Sepsis Labs: No results for input(s): "PROCALCITON", "LATICACIDVEN" in the last 168 hours.  No results found for this or any previous visit (from the past 240 hour(s)).   Radiology Studies: ECHOCARDIOGRAM COMPLETE  Result Date: 10/03/2023     ECHOCARDIOGRAM REPORT   Patient Name:   Bradley Powell Date of Exam: 10/03/2023 Medical Rec #:  [id number]          Height:       68.0 in Accession #:    [id number]         Weight:       277.9 lb Date of Birth:  Dec 10, 1980         BSA:          2.350 m Patient Age:    42 years           BP:           110/77 mmHg Patient Gender: M  HR:           69 bpm. Exam Location:  Inpatient Procedure: 2D Echo, Cardiac Doppler, Color Doppler and Intracardiac            Opacification Agent Indications:    CHF- Acute Systolic I50.1  History:        Patient has prior history of Echocardiogram examinations, most                 recent 02/16/2022. CHF and Cardiomyopathy, Defibrillator,                 Arrythmias:Atrial Fibrillation, Tachycardia, Atrial Flutter and                 PVC, Signs/Symptoms:Hypotension and Syncope; Risk Factors:Sleep                 Apnea.  Sonographer:    Lucendia Herrlich RCS Referring Phys: Ivar Drape DIAZ IMPRESSIONS  1. Left ventricular ejection fraction, by estimation, is 35 to 40%. The left ventricle has moderately decreased function. The left ventricle demonstrates global hypokinesis. Left ventricular diastolic parameters are indeterminate.  2. Right ventricular systolic function is moderately reduced. The right ventricular size is mildly enlarged. There is mildly elevated pulmonary artery systolic pressure. The estimated right ventricular systolic pressure is 39.0 mmHg.  3. The mitral valve is normal in structure. Trivial mitral valve regurgitation. No evidence of mitral stenosis.  4. The aortic valve is tricuspid. Aortic valve regurgitation is not visualized. Aortic valve sclerosis is present, with no evidence of aortic valve stenosis. Aortic valve Vmax measures 1.10 m/s.  5. The inferior vena cava is dilated in size with <50% respiratory variability, suggesting right atrial pressure of 15 mmHg. FINDINGS  Left Ventricle: Left ventricular ejection fraction, by estimation, is 35 to 40%.  The left ventricle has moderately decreased function. The left ventricle demonstrates global hypokinesis. Definity contrast agent was given IV to delineate the left ventricular endocardial borders. The left ventricular internal cavity size was normal in size. There is no left ventricular hypertrophy. Left ventricular diastolic parameters are indeterminate. Normal left ventricular filling pressure. Right Ventricle: The right ventricular size is mildly enlarged. No increase in right ventricular wall thickness. Right ventricular systolic function is moderately reduced. There is mildly elevated pulmonary artery systolic pressure. The tricuspid regurgitant velocity is 2.45 m/s, and with an assumed right atrial pressure of 15 mmHg, the estimated right ventricular systolic pressure is 39.0 mmHg. Left Atrium: Left atrial size was normal in size. Right Atrium: Right atrial size was normal in size. Pericardium: There is no evidence of pericardial effusion. Mitral Valve: The mitral valve is normal in structure. Trivial mitral valve regurgitation. No evidence of mitral valve stenosis. Tricuspid Valve: The tricuspid valve is normal in structure. Tricuspid valve regurgitation is trivial. No evidence of tricuspid stenosis. Aortic Valve: The aortic valve is tricuspid. Aortic valve regurgitation is not visualized. Aortic valve sclerosis is present, with no evidence of aortic valve stenosis. Aortic valve peak gradient measures 4.8 mmHg. Pulmonic Valve: The pulmonic valve was normal in structure. Pulmonic valve regurgitation is not visualized. No evidence of pulmonic stenosis. Aorta: The aortic root is normal in size and structure. Venous: The inferior vena cava is dilated in size with less than 50% respiratory variability, suggesting right atrial pressure of 15 mmHg. IAS/Shunts: No atrial level shunt detected by color flow Doppler. Additional Comments: A device lead is visualized.  LEFT VENTRICLE PLAX 2D LVIDd:         4.90  cm    Diastology LVIDs:         3.80 cm   LV e' medial:    7.07 cm/s LV PW:         1.00 cm   LV E/e' medial:  13.3 LV IVS:        1.00 cm   LV e' lateral:   10.90 cm/s LVOT diam:     1.80 cm   LV E/e' lateral: 8.6 LV SV:         47 LV SV Index:   20 LVOT Area:     2.54 cm  RIGHT VENTRICLE            IVC RV S prime:     9.79 cm/s  IVC diam: 3.40 cm TAPSE (M-mode): 1.6 cm LEFT ATRIUM             Index        RIGHT ATRIUM           Index LA diam:        5.10 cm 2.17 cm/m   RA Area:     24.50 cm LA Vol (A2C):   57.7 ml 24.55 ml/m  RA Volume:   71.70 ml  30.51 ml/m LA Vol (A4C):   76.1 ml 32.38 ml/m LA Biplane Vol: 67.5 ml 28.72 ml/m  AORTIC VALVE AV Area (Vmax): 2.41 cm AV Vmax:        110.00 cm/s AV Peak Grad:   4.8 mmHg LVOT Vmax:      104.33 cm/s LVOT Vmean:     69.033 cm/s LVOT VTI:       0.184 m  AORTA Ao Root diam: 2.60 cm Ao Asc diam:  2.50 cm MITRAL VALVE               TRICUSPID VALVE MV Area (PHT): 5.84 cm    TR Peak grad:   24.0 mmHg MV Decel Time: 130 msec    TR Vmax:        245.00 cm/s MR Peak grad: 29.2 mmHg MR Vmax:      270.00 cm/s  SHUNTS MV E velocity: 94.00 cm/s  Systemic VTI:  0.18 m MV A velocity: 49.20 cm/s  Systemic Diam: 1.80 cm MV E/A ratio:  1.91 Armanda Magic MD Electronically signed by Armanda Magic MD Signature Date/Time: 10/03/2023/12:29:44 PM    Final     Scheduled Meds:  allopurinol  200 mg Oral Daily   amiodarone  200 mg Oral Daily   apixaban  5 mg Oral BID   empagliflozin  10 mg Oral Daily   levothyroxine  50 mcg Oral QAC breakfast   mexiletine  150 mg Oral Q12H   pantoprazole  40 mg Oral BID   spironolactone  25 mg Oral Daily   Continuous Infusions:  furosemide (LASIX) 200 mg in dextrose 5 % 100 mL (2 mg/mL) infusion 25 mg/hr (10/04/23 1140)     LOS: 4 days    Time spent: 50 mins    Willeen Niece, MD [facility]   If 7PM-7AM, please contact night-coverage

## 2023-10-04 NOTE — Progress Notes (Signed)
Advanced Heart Failure Rounding Note   Subjective:    Weight up 2lbs. Given 160 IV lasix yesterday. Only -4L UOP documented but per patient report several occurrences over the night not documented.  Scr 2.46 - 2.09>2.3>2.08>2.39  K 4.9  Echo 11/19 EF 35-40%, LV with GHK, RV mod reduced, mildly elevated PASP, RVSP ~39, trivial MR   Feels ok this morning. Had chills yesterday and had to get warm blankets and hot packs on him. Happens at home sometimes, has to put himself in the hot shower for it to go away typically. Now resolved just complaining of BLE cramps from the shivering yesterday.   Objective:   Weight Range:  Vital Signs:   Temp:  [97.4 F (36.3 C)-98.5 F (36.9 C)] 97.5 F (36.4 C) (11/20 0411) Pulse Rate:  [66-89] 83 (11/19 2010) Resp:  [16-20] 20 (11/20 0411) BP: (91-103)/(50-77) 92/50 (11/20 0411) SpO2:  [98 %-99 %] 98 % (11/20 0411) Weight:  [126.6 kg] 126.6 kg (11/20 0456) Last BM Date : 10/03/23  Weight change: Filed Weights   10/02/23 0251 10/03/23 0413 10/04/23 0456  Weight: 133.1 kg 126.1 kg 126.6 kg    Intake/Output:   Intake/Output Summary (Last 24 hours) at 10/04/2023 0734 Last data filed at 10/04/2023 0400 Gross per 24 hour  Intake 1810.69 ml  Output 2550 ml  Net -739.31 ml     Physical Exam: General:  well appearing.  No respiratory difficulty HEENT: normal Neck: supple. JVD ~16 cm. Carotids 2+ bilat; no bruits. No lymphadenopathy or thyromegaly appreciated. Cor: PMI nondisplaced. Regular rate & irregular rhythm. No rubs, gallops or murmurs. Lungs: clear Abdomen: Obese, soft, nontender, nondistended. No hepatosplenomegaly. No bruits or masses. Good bowel sounds. Extremities: no cyanosis, clubbing, rash, +2 BLE edema to thigh Neuro: alert & oriented x 3, cranial nerves grossly intact. moves all 4 extremities w/o difficulty. Affect pleasant.   Telemetry: A Flutter low 100s (Personally reviewed)     Labs: Basic Metabolic  Panel: Recent Labs  Lab 09/29/23 1114 09/30/23 0518 10/01/23 0319 10/02/23 0454 10/02/23 1454 10/03/23 0410 10/03/23 1330 10/04/23 0400  NA 134*  --    < > 138 137 138 133* 138  K 5.1  --    < > 2.9* 2.8* 2.3* 3.4* 4.9  CL 99  --    < > 97* 95* 90* 91* 93*  CO2 27  --    < > 29 30 33* 28 26  GLUCOSE 94  --    < > 102* 104* 99 168* 84  BUN 34*  --    < > 33* 35* 33* 35* 39*  CREATININE 2.46*  --    < > 2.30* 2.19* 2.08* 1.93* 2.39*  CALCIUM 8.3*  --    < > 8.4* 8.6* 9.0 8.7* 9.2  MG 1.8 1.9  --  2.0  --   --   --  2.0   < > = values in this interval not displayed.    Liver Function Tests: Recent Labs  Lab 10/04/23 0400  AST 60*  ALT 43  ALKPHOS 202*  BILITOT 2.8*  PROT 6.6  ALBUMIN 2.5*   No results for input(s): "LIPASE", "AMYLASE" in the last 168 hours. No results for input(s): "AMMONIA" in the last 168 hours.  CBC: Recent Labs  Lab 09/29/23 1114 09/30/23 0513 10/01/23 0319 10/02/23 0454  WBC 9.2 9.0 10.2 8.3  NEUTROABS  --  7.5  --   --   HGB 13.8 13.9 13.7  15.2  HCT 44.3 44.3 43.1 48.5  MCV 86.4 85.2 85.0 84.9  PLT 323 310 302 298    Cardiac Enzymes: No results for input(s): "CKTOTAL", "CKMB", "CKMBINDEX", "TROPONINI" in the last 168 hours.  BNP: BNP (last 3 results) Recent Labs    04/18/23 1610 05/23/23 1618 09/30/23 0513  BNP 171.7* 209.9* 222.1*    ProBNP (last 3 results) No results for input(s): "PROBNP" in the last 8760 hours.    Other results:  Imaging: ECHOCARDIOGRAM COMPLETE  Result Date: 10/03/2023    ECHOCARDIOGRAM REPORT   Patient Name:   Bradley Powell Date of Exam: 10/03/2023 Medical Rec #:  [id number]          Height:       68.0 in Accession #:    [id number]         Weight:       277.9 lb Date of Birth:  1981/01/27         BSA:          2.350 m Patient Age:    42 years           BP:           110/77 mmHg Patient Gender: M                  HR:           69 bpm. Exam Location:  Inpatient Procedure: 2D Echo, Cardiac  Doppler, Color Doppler and Intracardiac            Opacification Agent Indications:    CHF- Acute Systolic I50.1  History:        Patient has prior history of Echocardiogram examinations, most                 recent 02/16/2022. CHF and Cardiomyopathy, Defibrillator,                 Arrythmias:Atrial Fibrillation, Tachycardia, Atrial Flutter and                 PVC, Signs/Symptoms:Hypotension and Syncope; Risk Factors:Sleep                 Apnea.  Sonographer:    Lucendia Herrlich RCS Referring Phys: Ivar Drape Rozelle Caudle IMPRESSIONS  1. Left ventricular ejection fraction, by estimation, is 35 to 40%. The left ventricle has moderately decreased function. The left ventricle demonstrates global hypokinesis. Left ventricular diastolic parameters are indeterminate.  2. Right ventricular systolic function is moderately reduced. The right ventricular size is mildly enlarged. There is mildly elevated pulmonary artery systolic pressure. The estimated right ventricular systolic pressure is 39.0 mmHg.  3. The mitral valve is normal in structure. Trivial mitral valve regurgitation. No evidence of mitral stenosis.  4. The aortic valve is tricuspid. Aortic valve regurgitation is not visualized. Aortic valve sclerosis is present, with no evidence of aortic valve stenosis. Aortic valve Vmax measures 1.10 m/s.  5. The inferior vena cava is dilated in size with <50% respiratory variability, suggesting right atrial pressure of 15 mmHg. FINDINGS  Left Ventricle: Left ventricular ejection fraction, by estimation, is 35 to 40%. The left ventricle has moderately decreased function. The left ventricle demonstrates global hypokinesis. Definity contrast agent was given IV to delineate the left ventricular endocardial borders. The left ventricular internal cavity size was normal in size. There is no left ventricular hypertrophy. Left ventricular diastolic parameters are indeterminate. Normal left ventricular filling pressure. Right Ventricle: The right  ventricular size is mildly  enlarged. No increase in right ventricular wall thickness. Right ventricular systolic function is moderately reduced. There is mildly elevated pulmonary artery systolic pressure. The tricuspid regurgitant velocity is 2.45 m/s, and with an assumed right atrial pressure of 15 mmHg, the estimated right ventricular systolic pressure is 39.0 mmHg. Left Atrium: Left atrial size was normal in size. Right Atrium: Right atrial size was normal in size. Pericardium: There is no evidence of pericardial effusion. Mitral Valve: The mitral valve is normal in structure. Trivial mitral valve regurgitation. No evidence of mitral valve stenosis. Tricuspid Valve: The tricuspid valve is normal in structure. Tricuspid valve regurgitation is trivial. No evidence of tricuspid stenosis. Aortic Valve: The aortic valve is tricuspid. Aortic valve regurgitation is not visualized. Aortic valve sclerosis is present, with no evidence of aortic valve stenosis. Aortic valve peak gradient measures 4.8 mmHg. Pulmonic Valve: The pulmonic valve was normal in structure. Pulmonic valve regurgitation is not visualized. No evidence of pulmonic stenosis. Aorta: The aortic root is normal in size and structure. Venous: The inferior vena cava is dilated in size with less than 50% respiratory variability, suggesting right atrial pressure of 15 mmHg. IAS/Shunts: No atrial level shunt detected by color flow Doppler. Additional Comments: A device lead is visualized.  LEFT VENTRICLE PLAX 2D LVIDd:         4.90 cm   Diastology LVIDs:         3.80 cm   LV e' medial:    7.07 cm/s LV PW:         1.00 cm   LV E/e' medial:  13.3 LV IVS:        1.00 cm   LV e' lateral:   10.90 cm/s LVOT diam:     1.80 cm   LV E/e' lateral: 8.6 LV SV:         47 LV SV Index:   20 LVOT Area:     2.54 cm  RIGHT VENTRICLE            IVC RV S prime:     9.79 cm/s  IVC diam: 3.40 cm TAPSE (M-mode): 1.6 cm LEFT ATRIUM             Index        RIGHT ATRIUM            Index LA diam:        5.10 cm 2.17 cm/m   RA Area:     24.50 cm LA Vol (A2C):   57.7 ml 24.55 ml/m  RA Volume:   71.70 ml  30.51 ml/m LA Vol (A4C):   76.1 ml 32.38 ml/m LA Biplane Vol: 67.5 ml 28.72 ml/m  AORTIC VALVE AV Area (Vmax): 2.41 cm AV Vmax:        110.00 cm/s AV Peak Grad:   4.8 mmHg LVOT Vmax:      104.33 cm/s LVOT Vmean:     69.033 cm/s LVOT VTI:       0.184 m  AORTA Ao Root diam: 2.60 cm Ao Asc diam:  2.50 cm MITRAL VALVE               TRICUSPID VALVE MV Area (PHT): 5.84 cm    TR Peak grad:   24.0 mmHg MV Decel Time: 130 msec    TR Vmax:        245.00 cm/s MR Peak grad: 29.2 mmHg MR Vmax:      270.00 cm/s  SHUNTS MV E velocity: 94.00 cm/s  Systemic  VTI:  0.18 m MV A velocity: 49.20 cm/s  Systemic Diam: 1.80 cm MV E/A ratio:  1.91 Armanda Magic MD Electronically signed by Armanda Magic MD Signature Date/Time: 10/03/2023/12:29:44 PM    Final      Medications:     Scheduled Medications:  allopurinol  200 mg Oral Daily   amiodarone  200 mg Oral Daily   apixaban  5 mg Oral BID   empagliflozin  10 mg Oral Daily   levothyroxine  50 mcg Oral QAC breakfast   mexiletine  150 mg Oral Q12H   pantoprazole  40 mg Oral BID   potassium chloride  40 mEq Oral Once   spironolactone  25 mg Oral Daily    Infusions:  furosemide      PRN Medications: acetaminophen, ondansetron (ZOFRAN) IV, phenol, sodium chloride   Assessment/Plan:  1. 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 Ratio 1.26 read as equivocal.  SPEP negative - cMRI repeated (11/22): LVEF 43%, RVEF 44%, LGE concerning for sarcoid.  - Echo (4/23): EF 35-40% Moderate RV dysfunction - Has seen geneticist, Dr. Jomarie Longs. Work up c/w LMNA.  - Admitted with NYHA IIIb symptoms and 40 pound weight gain - Echo 11/19 EF 35-40%, LV with GHK, RV mod reduced, mildly elevated PASP, RVSP ~39, trivial MR  - Weight up 2lbs. Renal function worsened. UOP  decreased. Suspect he will need to go back on lasix gtt, will discuss with MD.  - Continue Jardiance 10 - Continue spiro 25 - Has been off b-blocker due to bradycardia and heart block - Off ARNi and ARB (hypotension and N/V in the past) - can rechallenge as able   2. Syncope/VT - 04/23 > secondary to VT - s/p ICD 4/23. - Continue mexiletine 150 mg bid   - Continue amiodarone 200 mg daiy - Followed by Dr. Ladona Ridgel  3. AKI on CKD Stage IIIb - Due to cardiorenal syndrome - Baseline SCr 1.7-2.0 - Admit Scr 2.46 -> 2.09>2.3>2.0>1.93>2.39 today - Follow with diuresis   4. Chronic AF/AFL - S/p AFL ablation 5/19 with Dr. Ladona Ridgel. - Continue Eliquis 5 mg bid. No bleeding issues. - Continue amiodarone 200 daily   5.  OSA - Continue CPAP.   6. PVCs/VT - Continue mexiletine + amiodarone. - Followed by EP   7. Obesity - Body mass index is Body mass index is 42.44 kg/m. - Consider GLP1RA  8. Hypokalemia - resolved   Length of Stay: 4   Bradley Powell Merlene Morse AGACNP-BC 10/04/2023, 7:34 AM  Advanced Heart Failure Team Pager [phone] (M-F; 7a - 4p)  Please contact [facility] for night-coverage after hours (4p -7a ) and weekends on [email or web address]

## 2023-10-04 NOTE — Plan of Care (Signed)
  Problem: Health Behavior/Discharge Planning: Goal: Ability to manage health-related needs will improve Outcome: Progressing   Problem: Clinical Measurements: Goal: Ability to maintain clinical measurements within normal limits will improve Outcome: Progressing Goal: Diagnostic test results will improve Outcome: Progressing   

## 2023-10-05 ENCOUNTER — Other Ambulatory Visit (HOSPITAL_COMMUNITY): Payer: Self-pay

## 2023-10-05 DIAGNOSIS — I5023 Acute on chronic systolic (congestive) heart failure: Secondary | ICD-10-CM | POA: Diagnosis not present

## 2023-10-05 LAB — BASIC METABOLIC PANEL
Anion gap: 14 (ref 5–15)
Anion gap: 13 (ref 5–15)
BUN: 37 mg/dL — ABNORMAL HIGH (ref 6–20)
BUN: 41 mg/dL — ABNORMAL HIGH (ref 6–20)
CO2: 30 mmol/L (ref 22–32)
CO2: 28 mmol/L (ref 22–32)
Calcium: 8.5 mg/dL — ABNORMAL LOW (ref 8.9–10.3)
Calcium: 8.3 mg/dL — ABNORMAL LOW (ref 8.9–10.3)
Chloride: 91 mmol/L — ABNORMAL LOW (ref 98–111)
Chloride: 92 mmol/L — ABNORMAL LOW (ref 98–111)
Creatinine, Ser: 2.47 mg/dL — ABNORMAL HIGH (ref 0.61–1.24)
Creatinine, Ser: 2.63 mg/dL — ABNORMAL HIGH (ref 0.61–1.24)
GFR, Estimated: 33 mL/min — ABNORMAL LOW (ref >60)
GFR, Estimated: 31 mL/min — ABNORMAL LOW (ref >60)
Glucose, Bld: 106 mg/dL — ABNORMAL HIGH (ref 70–99)
Glucose, Bld: 151 mg/dL — ABNORMAL HIGH (ref 70–99)
Potassium: 2.9 mmol/L — ABNORMAL LOW (ref 3.5–5.1)
Potassium: 3.4 mmol/L — ABNORMAL LOW (ref 3.5–5.1)
Sodium: 135 mmol/L (ref 135–145)
Sodium: 133 mmol/L — ABNORMAL LOW (ref 135–145)

## 2023-10-05 LAB — RESPIRATORY PANEL BY PCR

## 2023-10-05 LAB — URINALYSIS, ROUTINE W REFLEX MICROSCOPIC
Bacteria, UA: NONE SEEN
Bilirubin Urine: NEGATIVE
Glucose, UA: >=500 mg/dL — AB
Hgb urine dipstick: NEGATIVE
Ketones, ur: NEGATIVE mg/dL
Leukocytes,Ua: NEGATIVE
Nitrite: NEGATIVE
Protein, ur: NEGATIVE mg/dL
Specific Gravity, Urine: 1.008 (ref 1.005–1.030)
pH: 7 (ref 5.0–8.0)

## 2023-10-05 LAB — PHOSPHORUS: Phosphorus: 3.2 mg/dL (ref 2.5–4.6)

## 2023-10-05 LAB — CBC
HCT: 38.6 % — ABNORMAL LOW (ref 39.0–52.0)
HCT: 41.1 % (ref 39.0–52.0)
Hemoglobin: 12.7 g/dL — ABNORMAL LOW (ref 13.0–17.0)
Hemoglobin: 13.1 g/dL (ref 13.0–17.0)
MCH: 26.5 pg (ref 26.0–34.0)
MCH: 26.9 pg (ref 26.0–34.0)
MCHC: 31.9 g/dL (ref 30.0–36.0)
MCHC: 32.9 g/dL (ref 30.0–36.0)
MCV: 81.8 fL (ref 80.0–100.0)
MCV: 83 fL (ref 80.0–100.0)
Platelets: 206 10*3/uL (ref 150–400)
Platelets: 214 10*3/uL (ref 150–400)
RBC: 4.72 MIL/uL (ref 4.22–5.81)
RBC: 4.95 MIL/uL (ref 4.22–5.81)
RDW: 16.3 % — ABNORMAL HIGH (ref 11.5–15.5)
RDW: 16.4 % — ABNORMAL HIGH (ref 11.5–15.5)
WBC: 17.9 10*3/uL — ABNORMAL HIGH (ref 4.0–10.5)
WBC: 21.6 10*3/uL — ABNORMAL HIGH (ref 4.0–10.5)
nRBC: 0 % (ref 0.0–0.2)
nRBC: 0 % (ref 0.0–0.2)

## 2023-10-05 LAB — MAGNESIUM: Magnesium: 1.9 mg/dL (ref 1.7–2.4)

## 2023-10-05 LAB — LACTIC ACID, PLASMA: Lactic Acid, Venous: 1.8 mmol/L (ref 0.5–1.9)

## 2023-10-05 MED ORDER — POTASSIUM CHLORIDE 20 MEQ PO PACK: 40.0000 meq | PACK | Freq: Once | ORAL | Status: DC

## 2023-10-05 MED ORDER — POTASSIUM CHLORIDE 20 MEQ PO PACK
40.0000 meq | PACK | Freq: Once | ORAL | Status: AC
  Administered 2023-10-05: 40 meq via ORAL
  Filled 2023-10-05: qty 2

## 2023-10-05 MED ORDER — FUROSEMIDE 10 MG/ML IJ SOLN
160.0000 mg | Freq: Once | INTRAVENOUS | Status: AC
  Administered 2023-10-05: 160 mg via INTRAVENOUS
  Filled 2023-10-05: qty 10

## 2023-10-05 MED ORDER — ACETAZOLAMIDE 250 MG PO TABS
500.0000 mg | ORAL_TABLET | Freq: Once | ORAL | Status: AC
  Administered 2023-10-05: 500 mg via ORAL
  Filled 2023-10-05: qty 2

## 2023-10-05 MED ORDER — OXYCODONE-ACETAMINOPHEN 5-325 MG PO TABS
1.0000 | ORAL_TABLET | Freq: Three times a day (TID) | ORAL | Status: DC | PRN
  Administered 2023-10-05 – 2023-10-14 (×16): 1 via ORAL
  Filled 2023-10-05 (×18): qty 1

## 2023-10-05 MED ORDER — SODIUM CHLORIDE 0.9 % IV SOLN
2.0000 g | Freq: Once | INTRAVENOUS | Status: AC
  Administered 2023-10-05: 2 g via INTRAVENOUS
  Filled 2023-10-05: qty 12.5

## 2023-10-05 MED ORDER — VANCOMYCIN HCL IN DEXTROSE 1-5 GM/200ML-% IV SOLN
1000.0000 mg | INTRAVENOUS | Status: DC
  Administered 2023-10-06: 1000 mg via INTRAVENOUS
  Filled 2023-10-05: qty 200

## 2023-10-05 MED ORDER — SODIUM CHLORIDE 0.9 % IV SOLN
2.0000 g | Freq: Two times a day (BID) | INTRAVENOUS | Status: DC
  Administered 2023-10-05 – 2023-10-09 (×8): 2 g via INTRAVENOUS
  Filled 2023-10-05 (×8): qty 12.5

## 2023-10-05 MED ORDER — METRONIDAZOLE 500 MG/100ML IV SOLN
500.0000 mg | Freq: Two times a day (BID) | INTRAVENOUS | Status: DC
  Administered 2023-10-05 – 2023-10-06 (×3): 500 mg via INTRAVENOUS
  Filled 2023-10-05 (×3): qty 100

## 2023-10-05 MED ORDER — VANCOMYCIN HCL IN DEXTROSE 1-5 GM/200ML-% IV SOLN
1000.0000 mg | INTRAVENOUS | Status: AC
  Administered 2023-10-05 (×2): 1000 mg via INTRAVENOUS
  Filled 2023-10-05 (×2): qty 200

## 2023-10-05 MED ORDER — VANCOMYCIN HCL IN DEXTROSE 1-5 GM/200ML-% IV SOLN
1000.0000 mg | INTRAVENOUS | Status: DC
Start: 2023-10-05 — End: 2023-10-05
  Filled 2023-10-05 (×2): qty 200

## 2023-10-05 MED ORDER — POTASSIUM CHLORIDE CRYS ER 20 MEQ PO TBCR
40.0000 meq | EXTENDED_RELEASE_TABLET | Freq: Once | ORAL | Status: AC
Start: 2023-10-05 — End: 2023-10-05
  Administered 2023-10-05: 40 meq via ORAL
  Filled 2023-10-05: qty 2

## 2023-10-05 MED ORDER — FUROSEMIDE 10 MG/ML IJ SOLN
160.0000 mg | Freq: Two times a day (BID) | INTRAVENOUS | Status: DC
  Administered 2023-10-05 – 2023-10-06 (×2): 160 mg via INTRAVENOUS
  Filled 2023-10-05: qty 10
  Filled 2023-10-05: qty 16
  Filled 2023-10-05: qty 10

## 2023-10-05 MED ORDER — POTASSIUM CHLORIDE CRYS ER 20 MEQ PO TBCR
40.0000 meq | EXTENDED_RELEASE_TABLET | Freq: Once | ORAL | Status: AC
  Administered 2023-10-05: 40 meq via ORAL
  Filled 2023-10-05: qty 2

## 2023-10-05 MED ORDER — VANCOMYCIN HCL 2000 MG/400ML IV SOLN: 2000.0000 mg | Freq: Once | INTRAVENOUS | Status: DC

## 2023-10-05 NOTE — Plan of Care (Signed)

## 2023-10-05 NOTE — Progress Notes (Signed)
Pharmacy Antibiotic Note  Bradley Powell is a 42 y.o. male admitted on 09/29/2023 with fever and leukocytosis with unclear source.  Pharmacy has been consulted for vancomycin and cefepime dosing.  Plan: Vancomycin 2000mg  x1 then 1000g IV Q24H. Goal AUC 400-550.  Expected AUC 450. Cefepime 2g IV Q12H.  Height: 5\' 8"  (172.7 cm) Weight: 126.6 kg (279 lb 1.6 oz) IBW/kg (Calculated) : 68.4  Temp (24hrs), Avg:99.8 F (37.7 C), Min:97.5 F (36.4 C), Max:101.9 F (38.8 C)  Recent Labs  Lab 09/29/23 1114 09/30/23 0513 10/01/23 0319 10/02/23 0454 10/02/23 1454 10/03/23 0410 10/03/23 1330 10/04/23 0400 10/04/23 2102 10/05/23 0002  WBC 9.2 9.0 10.2 8.3  --   --   --   --   --  21.6*  CREATININE 2.46*  --  2.09* 2.30* 2.19* 2.08* 1.93* 2.39* 2.56*  --   LATICACIDVEN  --   --   --   --   --   --   --   --   --  1.8    Estimated Creatinine Clearance: 49.8 mL/min (A) (by C-G formula based on SCr of 2.56 mg/dL (H)).    Allergies  Allergen Reactions   Entresto [Sacubitril-Valsartan] Nausea And Vomiting and Other (See Comments)    Lightheaded, fainting    Thank you for allowing pharmacy to be a part of this patient's care.  Vernard Gambles, PharmD, BCPS  10/05/2023 4:06 AM

## 2023-10-05 NOTE — Progress Notes (Signed)
   10/04/23 2003  Assess: MEWS Score  Temp (!) 101.9 F (38.8 C)  BP (!) 112/57  MAP (mmHg) 74  ECG Heart Rate 90  Resp 18  SpO2 97 %  O2 Device Room Air  Assess: MEWS Score  MEWS Temp 2  MEWS Systolic 0  MEWS Pulse 0  MEWS RR 0  MEWS LOC 0  MEWS Score 2  MEWS Score Color Yellow  Assess: if the MEWS score is Yellow or Red  Were vital signs accurate and taken at a resting state? Yes  Does the patient meet 2 or more of the SIRS criteria? Yes  Does the patient have a confirmed or suspected source of infection? No  MEWS guidelines implemented  Yes, yellow  Treat  MEWS Interventions Considered administering scheduled or prn medications/treatments as ordered  Take Vital Signs  Increase Vital Sign Frequency  Yellow: Q2hr x1, continue Q4hrs until patient remains green for 12hrs  Escalate  MEWS: Escalate Yellow: Discuss with charge nurse and consider notifying provider and/or RRT  Notify: Charge Nurse/RN  Name of Charge Nurse/RN Notified Doyne Keel, RN  Provider Notification  Provider Name/Title Loney Loh, MD  Date Provider Notified 10/04/23  Time Provider Notified 2057  Method of Notification Page  Notification Reason Change in status  Provider response See new orders  Date of Provider Response 10/04/23  Time of Provider Response [id number]  Assess: SIRS CRITERIA  SIRS Temperature  1  SIRS Pulse 0  SIRS Respirations  0  SIRS WBC 0  SIRS Score Sum  1

## 2023-10-05 NOTE — Progress Notes (Addendum)
Advanced Heart Failure Rounding Note   Subjective:    Lasix gtt restarted yesterday, held for ~4 hours to allow for patient rest overnight. -6L UOP documented. Weight down 7lbs.   Scr 2.46 - 2.09>2.3>2.08>2.39>2.56> pending today  K pending  Pt temp up to 101.9 with chills yesterday. PCXR with no evidence of PNA and UA with no UTI. Waiting for blood cultures to come back. WBC peaked at 21.6. Started on broad spectrum abx.   Asking for a break from lasix this morning, tired. No further chills. Has headache.   Objective:   Echo 11/19 EF 35-40%, LV with GHK, RV mod reduced, mildly elevated PASP, RVSP ~39, trivial MR   Weight Range:  Vital Signs:   Temp:  [99 F (37.2 C)-101.9 F (38.8 C)] 101.6 F (38.7 C) (11/21 0500) Pulse Rate:  [96-109] 106 (11/21 0500) Resp:  [16-20] 20 (11/21 0500) BP: (91-112)/(52-60) 95/56 (11/21 0037) SpO2:  [95 %-97 %] 96 % (11/21 0500) Weight:  [123.5 kg] 123.5 kg (11/21 0500) Last BM Date : 10/04/23  Weight change: Filed Weights   10/03/23 0413 10/04/23 0456 10/05/23 0500  Weight: 126.1 kg 126.6 kg 123.5 kg    Intake/Output:   Intake/Output Summary (Last 24 hours) at 10/05/2023 0723 Last data filed at 10/05/2023 0500 Gross per 24 hour  Intake 1737 ml  Output 6030 ml  Net -4293 ml     Physical Exam: General:  well appearing, sitting on EOB.  No respiratory difficulty HEENT: normal Neck: supple. JVD ~14 cm. Carotids 2+ bilat; no bruits. No lymphadenopathy or thyromegaly appreciated. Cor: PMI nondisplaced. Regular rate & irregular rhythm. No rubs, gallops or murmurs. Lungs: clear Abdomen: soft, nontender, nondistended. No hepatosplenomegaly. No bruits or masses. Good bowel sounds. Extremities: no cyanosis, clubbing, rash, +1 BLE edema  Neuro: alert & oriented x 3, cranial nerves grossly intact. moves all 4 extremities w/o difficulty. Affect pleasant.   Telemetry: A Flutter 90s (Personally reviewed)     Labs: Basic Metabolic  Panel: Recent Labs  Lab 09/29/23 1114 09/30/23 0518 10/01/23 0319 10/02/23 0454 10/02/23 1454 10/03/23 0410 10/03/23 1330 10/04/23 0400 10/04/23 2102 10/05/23 0311  NA 134*  --    < > 138 137 138 133* 138 138  --   K 5.1  --    < > 2.9* 2.8* 2.3* 3.4* 4.9 3.4*  --   CL 99  --    < > 97* 95* 90* 91* 93* 90*  --   CO2 27  --    < > 29 30 33* 28 26 34*  --   GLUCOSE 94  --    < > 102* 104* 99 168* 84 128*  --   BUN 34*  --    < > 33* 35* 33* 35* 39* 35*  --   CREATININE 2.46*  --    < > 2.30* 2.19* 2.08* 1.93* 2.39* 2.56*  --   CALCIUM 8.3*  --    < > 8.4* 8.6* 9.0 8.7* 9.2 8.9  --   MG 1.8 1.9  --  2.0  --   --   --  2.0  --  1.9  PHOS  --   --   --   --   --   --   --   --   --  3.2   < > = values in this interval not displayed.    Liver Function Tests: Recent Labs  Lab 10/04/23 0400  AST 60*  ALT 43  ALKPHOS 202*  BILITOT 2.8*  PROT 6.6  ALBUMIN 2.5*   No results for input(s): "LIPASE", "AMYLASE" in the last 168 hours. No results for input(s): "AMMONIA" in the last 168 hours.  CBC: Recent Labs  Lab 09/30/23 0513 10/01/23 0319 10/02/23 0454 10/05/23 0002 10/05/23 0311  WBC 9.0 10.2 8.3 21.6* 17.9*  NEUTROABS 7.5  --   --   --   --   HGB 13.9 13.7 15.2 12.7* 13.1  HCT 44.3 43.1 48.5 38.6* 41.1  MCV 85.2 85.0 84.9 81.8 83.0  PLT 310 302 298 214 206    Cardiac Enzymes: No results for input(s): "CKTOTAL", "CKMB", "CKMBINDEX", "TROPONINI" in the last 168 hours.  BNP: BNP (last 3 results) Recent Labs    04/18/23 1610 05/23/23 1618 09/30/23 0513  BNP 171.7* 209.9* 222.1*    ProBNP (last 3 results) No results for input(s): "PROBNP" in the last 8760 hours.    Other results:  Imaging: DG CHEST PORT 1 VIEW  Result Date: 10/05/2023 CLINICAL DATA:  Fevers EXAM: PORTABLE CHEST 1 VIEW COMPARISON:  09/29/2023 FINDINGS: Cardiac shadows within normal limits. Defibrillator is noted and stable. Lungs are well aerated bilaterally. Mild central vascular  congestion is noted without edema. No bony abnormality is seen. IMPRESSION: Mild central vascular congestion without edema. Electronically Signed   By: Alcide Clever M.D.   On: 10/05/2023 01:39   ECHOCARDIOGRAM COMPLETE  Result Date: 10/03/2023    ECHOCARDIOGRAM REPORT   Patient Name:   Bradley Powell Date of Exam: 10/03/2023 Medical Rec #:  [id number]          Height:       68.0 in Accession #:    [id number]         Weight:       277.9 lb Date of Birth:  05/17/1981         BSA:          2.350 m Patient Age:    42 years           BP:           110/77 mmHg Patient Gender: M                  HR:           69 bpm. Exam Location:  Inpatient Procedure: 2D Echo, Cardiac Doppler, Color Doppler and Intracardiac            Opacification Agent Indications:    CHF- Acute Systolic I50.1  History:        Patient has prior history of Echocardiogram examinations, most                 recent 02/16/2022. CHF and Cardiomyopathy, Defibrillator,                 Arrythmias:Atrial Fibrillation, Tachycardia, Atrial Flutter and                 PVC, Signs/Symptoms:Hypotension and Syncope; Risk Factors:Sleep                 Apnea.  Sonographer:    Lucendia Herrlich RCS Referring Phys: Ivar Drape Omar Orrego IMPRESSIONS  1. Left ventricular ejection fraction, by estimation, is 35 to 40%. The left ventricle has moderately decreased function. The left ventricle demonstrates global hypokinesis. Left ventricular diastolic parameters are indeterminate.  2. Right ventricular systolic function is moderately reduced. The right ventricular size is mildly enlarged. There is mildly elevated pulmonary artery  systolic pressure. The estimated right ventricular systolic pressure is 39.0 mmHg.  3. The mitral valve is normal in structure. Trivial mitral valve regurgitation. No evidence of mitral stenosis.  4. The aortic valve is tricuspid. Aortic valve regurgitation is not visualized. Aortic valve sclerosis is present, with no evidence of aortic valve stenosis.  Aortic valve Vmax measures 1.10 m/s.  5. The inferior vena cava is dilated in size with <50% respiratory variability, suggesting right atrial pressure of 15 mmHg. FINDINGS  Left Ventricle: Left ventricular ejection fraction, by estimation, is 35 to 40%. The left ventricle has moderately decreased function. The left ventricle demonstrates global hypokinesis. Definity contrast agent was given IV to delineate the left ventricular endocardial borders. The left ventricular internal cavity size was normal in size. There is no left ventricular hypertrophy. Left ventricular diastolic parameters are indeterminate. Normal left ventricular filling pressure. Right Ventricle: The right ventricular size is mildly enlarged. No increase in right ventricular wall thickness. Right ventricular systolic function is moderately reduced. There is mildly elevated pulmonary artery systolic pressure. The tricuspid regurgitant velocity is 2.45 m/s, and with an assumed right atrial pressure of 15 mmHg, the estimated right ventricular systolic pressure is 39.0 mmHg. Left Atrium: Left atrial size was normal in size. Right Atrium: Right atrial size was normal in size. Pericardium: There is no evidence of pericardial effusion. Mitral Valve: The mitral valve is normal in structure. Trivial mitral valve regurgitation. No evidence of mitral valve stenosis. Tricuspid Valve: The tricuspid valve is normal in structure. Tricuspid valve regurgitation is trivial. No evidence of tricuspid stenosis. Aortic Valve: The aortic valve is tricuspid. Aortic valve regurgitation is not visualized. Aortic valve sclerosis is present, with no evidence of aortic valve stenosis. Aortic valve peak gradient measures 4.8 mmHg. Pulmonic Valve: The pulmonic valve was normal in structure. Pulmonic valve regurgitation is not visualized. No evidence of pulmonic stenosis. Aorta: The aortic root is normal in size and structure. Venous: The inferior vena cava is dilated in size with  less than 50% respiratory variability, suggesting right atrial pressure of 15 mmHg. IAS/Shunts: No atrial level shunt detected by color flow Doppler. Additional Comments: A device lead is visualized.  LEFT VENTRICLE PLAX 2D LVIDd:         4.90 cm   Diastology LVIDs:         3.80 cm   LV e' medial:    7.07 cm/s LV PW:         1.00 cm   LV E/e' medial:  13.3 LV IVS:        1.00 cm   LV e' lateral:   10.90 cm/s LVOT diam:     1.80 cm   LV E/e' lateral: 8.6 LV SV:         47 LV SV Index:   20 LVOT Area:     2.54 cm  RIGHT VENTRICLE            IVC RV S prime:     9.79 cm/s  IVC diam: 3.40 cm TAPSE (M-mode): 1.6 cm LEFT ATRIUM             Index        RIGHT ATRIUM           Index LA diam:        5.10 cm 2.17 cm/m   RA Area:     24.50 cm LA Vol (A2C):   57.7 ml 24.55 ml/m  RA Volume:   71.70 ml  30.51 ml/m  LA Vol (A4C):   76.1 ml 32.38 ml/m LA Biplane Vol: 67.5 ml 28.72 ml/m  AORTIC VALVE AV Area (Vmax): 2.41 cm AV Vmax:        110.00 cm/s AV Peak Grad:   4.8 mmHg LVOT Vmax:      104.33 cm/s LVOT Vmean:     69.033 cm/s LVOT VTI:       0.184 m  AORTA Ao Root diam: 2.60 cm Ao Asc diam:  2.50 cm MITRAL VALVE               TRICUSPID VALVE MV Area (PHT): 5.84 cm    TR Peak grad:   24.0 mmHg MV Decel Time: 130 msec    TR Vmax:        245.00 cm/s MR Peak grad: 29.2 mmHg MR Vmax:      270.00 cm/s  SHUNTS MV E velocity: 94.00 cm/s  Systemic VTI:  0.18 m MV A velocity: 49.20 cm/s  Systemic Diam: 1.80 cm MV E/A ratio:  1.91 Armanda Magic MD Electronically signed by Armanda Magic MD Signature Date/Time: 10/03/2023/12:29:44 PM    Final      Medications:     Scheduled Medications:  allopurinol  200 mg Oral Daily   amiodarone  200 mg Oral Daily   apixaban  5 mg Oral BID   empagliflozin  10 mg Oral Daily   levothyroxine  50 mcg Oral QAC breakfast   mexiletine  150 mg Oral Q12H   pantoprazole  40 mg Oral BID   spironolactone  25 mg Oral Daily    Infusions:  ceFEPime (MAXIPIME) IV     furosemide (LASIX) 200 mg in  dextrose 5 % 100 mL (2 mg/mL) infusion 25 mg/hr (10/05/23 0050)   metronidazole     [START ON 10/06/2023] vancomycin     vancomycin 1,000 mg (10/05/23 0706)    PRN Medications: acetaminophen, baclofen, ondansetron (ZOFRAN) IV, phenol, sodium chloride   Assessment/Plan:  1. 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 Ratio 1.26 read as equivocal.  SPEP negative - cMRI repeated (11/22): LVEF 43%, RVEF 44%, LGE concerning for sarcoid.  - Echo (4/23): EF 35-40% Moderate RV dysfunction - Has seen geneticist, Dr. Jomarie Longs. Work up c/w LMNA.  - Admitted with NYHA IIIb symptoms and 40 pound weight gain - Echo 11/19 EF 35-40%, LV with GHK, RV mod reduced, mildly elevated PASP, RVSP ~39, trivial MR  - Weight down 7 lbs. SCr rising. Hold lasix gtt this morning. Switch to bolus dosing 160 mg BID +diamox today - Continue Jardiance 10 - Continue spiro 25 - Has been off b-blocker due to bradycardia and heart block - Off ARNi and ARB (hypotension and N/V in the past) - can rechallenge as able   2. Syncope/VT - 04/23 > secondary to VT - s/p ICD 4/23. - Continue mexiletine 150 mg bid   - Continue amiodarone 200 mg daiy - Followed by Dr. Ladona Ridgel  3. AKI on CKD Stage IIIb - Due to cardiorenal syndrome - Baseline SCr 1.7-2.0 - Admit Scr 2.46 -> 2.09>2.3>2.0>1.93>2.39 today - Follow with diuresis   4. Chronic AF/AFL - S/p AFL ablation 5/19 with Dr. Ladona Ridgel. - Continue Eliquis 5 mg bid. No bleeding issues. - Continue amiodarone 200 daily - Plan for DCCV once better diuresed.    5.  OSA - Continue CPAP.   6. PVCs/VT - Continue mexiletine + amiodarone. -  Followed by EP   7. Obesity - Body mass index is Body mass index is 41.4 kg/m. - Consider GLP1RA  8. Hypokalemia - K 3.4, replete - BMET pending  9. ID - per primary. WBC peaked at 21.6. today at 17.9 - tMax 101.9 - UA (-), PCXR (-) - pending blood  cultures - now on broad spectrum abx - check resp panel   Length of Stay: 5   Aron Needles L Aracelly Tencza AGACNP-BC 10/05/2023, 7:23 AM  Advanced Heart Failure Team Pager [phone] (M-F; 7a - 4p)  Please contact [facility] for night-coverage after hours (4p -7a ) and weekends on [email or web address]

## 2023-10-05 NOTE — Progress Notes (Addendum)
PROGRESS NOTE    Bradley Powell  ZOX:[id number] DOB: February 25, 1981 DOA: 09/29/2023 PCP: [name], MD   Brief Narrative:  This 42 year old male with past medical history of paroxysmal atrial flutter s/p remote ablation, chronic combined systolic/diastolic CHF, NICM, OSA and morbid obesity.  He presents to the ER with complaints of increasing fluid retention on his legs up to his stomach.  Per patient his been compliant with his medications.  On lasix gtt directed by CHF team.  Cardiology is following. Plan is  to have DCCV when adequately diuresed.   Assessment & Plan:   Principal Problem:   Acute on chronic congestive heart failure (HCC) Active Problems:   Hepatic cirrhosis (HCC)   Edema   Morbid obesity (HCC)   Gout   NICM (nonischemic cardiomyopathy) (HCC)   Paroxysmal atrial fibrillation (HCC)   Acute on chronic systolic (congestive) heart failure (HCC)   ICD (implantable cardioverter-defibrillator) in place   Fluid overload  Acute on chronic systolic heart failure: Echo >  LVEF 35-40%.  Patient is started on Lasix gtt. as per CHF protocol. Medications adjusted by CHF team. Strict I's and O's, daily weights Cardiology consult appreciated. Down 27L overall and 16lbs. Patient wants to have a break from Lasix.  Stopped as per cardiology. Lasix gtt switched to Lasix 160 mg twice daily plus Diamox   Intake/Output Summary (Last 24 hours) at 10/05/2023 1222 Last data filed at 10/05/2023 0500 Gross per 24 hour  Intake 1137 ml  Output 3730 ml  Net -2593 ml    CKD stage 4 Serum creatinine around baseline.   Permanent atrial fibrillation Continue Amiodarone/ Eliquis. TEE/DCCV is delayed due to patient having fever last night. DCCV would be pursued once patient is adequately diuresed.   Hepatic cirrhosis (HCC) Likely secondary to NASH.   Morbid obesity (HCC)// OSA Estimated body mass index is 42.25 kg/m as calculated from the following:   Height as of this  encounter: 5\' 8"  (1.727 m).   Weight as of this encounter: 126.1 kg.    Hypokalemia Replace aggressively. -Mg normal  Fever: Patient spiked fever last night, pancultures were sent. Chest x-ray, UA unremarkable. Empirically started on 6 vancomycin cefepime and Flagyl. Follow blood and urine cultures.  DVT prophylaxis: Eliquis Code Status: Full code Family Communication: No family at bedside Disposition Plan:   Admitted for acute on chronic systolic CHF.  Cardiology is consulted,  requiring IV diuresis. Also found to have A-fib, requiring DCCV once adequately diuresed.  Consultants:  Cardiology  Procedures:  Echocardiogram.  Antimicrobials: Anti-infectives (From admission, onward)    Start     Dose/Rate Route Frequency Ordered Stop   10/06/23 0600  vancomycin (VANCOCIN) IVPB 1000 mg/200 mL premix        1,000 mg 200 mL/hr over 60 Minutes Intravenous Every 24 hours 10/05/23 0412     10/05/23 1600  ceFEPIme (MAXIPIME) 2 g in sodium chloride 0.9 % 100 mL IVPB        2 g 200 mL/hr over 30 Minutes Intravenous Every 12 hours 10/05/23 0412     10/05/23 0515  vancomycin (VANCOCIN) IVPB 1000 mg/200 mL premix        1,000 mg 200 mL/hr over 60 Minutes Intravenous Every 1 hr x 2 10/05/23 0415 10/05/23 0927   10/05/23 0500  metroNIDAZOLE (FLAGYL) IVPB 500 mg        500 mg 100 mL/hr over 60 Minutes Intravenous Every 12 hours 10/05/23 0402     10/05/23 0500  vancomycin (VANCOCIN)  IVPB 1000 mg/200 mL premix  Status:  Discontinued        1,000 mg 200 mL/hr over 60 Minutes Intravenous Every 1 hr x 2 10/05/23 0412 10/05/23 0414   10/05/23 0500  ceFEPIme (MAXIPIME) 2 g in sodium chloride 0.9 % 100 mL IVPB        2 g 200 mL/hr over 30 Minutes Intravenous  Once 10/05/23 0412 10/05/23 0926   10/05/23 0500  vancomycin (VANCOREADY) IVPB 2000 mg/400 mL  Status:  Discontinued        2,000 mg 200 mL/hr over 120 Minutes Intravenous  Once 10/05/23 0414 10/05/23 0415       Subjective: Patient  was seen and examined at bedside.  Overnight events noted.   Patient is morbidly obese, severely deconditioned, remains on supplemental oxygen. Patient  spiked fever last night , Sepsis workup started.  Objective: Vitals:   10/05/23 0037 10/05/23 0500 10/05/23 0921 10/05/23 1211  BP: (!) 95/56  (!) 96/58 100/62  Pulse:  (!) 106 97 100  Resp: 20 20 20 19   Temp: 99.7 F (37.6 C) (!) 101.6 F (38.7 C) 99.5 F (37.5 C) 98.7 F (37.1 C)  TempSrc: Oral Oral Oral Oral  SpO2: 96% 96% 100% 98%  Weight:  123.5 kg    Height:        Intake/Output Summary (Last 24 hours) at 10/05/2023 1222 Last data filed at 10/05/2023 0500 Gross per 24 hour  Intake 1137 ml  Output 3730 ml  Net -2593 ml   Filed Weights   10/03/23 0413 10/04/23 0456 10/05/23 0500  Weight: 126.1 kg 126.6 kg 123.5 kg    Examination:  General exam: Appears comfortable, deconditioned, not in any acute distress. Respiratory system: CTA bilaterally.  Respiratory effort normal.  RR 13 Cardiovascular system: S1 & S2 heard, irregular rhythm, no murmur . Gastrointestinal system: Abdomen is nondistended, soft and non tender. Normal bowel sounds heard. Central nervous system: Alert and oriented x3. No focal neurological deficits. Extremities: Edema+, no cyanosis, no clubbing Skin: No rashes, lesions or ulcers Psychiatry: Judgement and insight appear normal. Mood & affect appropriate.     Data Reviewed: I have personally reviewed following labs and imaging studies  CBC: Recent Labs  Lab 09/30/23 0513 10/01/23 0319 10/02/23 0454 10/05/23 0002 10/05/23 0311  WBC 9.0 10.2 8.3 21.6* 17.9*  NEUTROABS 7.5  --   --   --   --   HGB 13.9 13.7 15.2 12.7* 13.1  HCT 44.3 43.1 48.5 38.6* 41.1  MCV 85.2 85.0 84.9 81.8 83.0  PLT 310 302 298 214 206   Basic Metabolic Panel: Recent Labs  Lab 09/29/23 1114 09/30/23 0518 10/01/23 0319 10/02/23 0454 10/02/23 1454 10/03/23 0410 10/03/23 1330 10/04/23 0400 10/04/23 2102  10/05/23 0311  NA 134*  --    < > 138   < > 138 133* 138 138 135  K 5.1  --    < > 2.9*   < > 2.3* 3.4* 4.9 3.4* 2.9*  CL 99  --    < > 97*   < > 90* 91* 93* 90* 91*  CO2 27  --    < > 29   < > 33* 28 26 34* 30  GLUCOSE 94  --    < > 102*   < > 99 168* 84 128* 106*  BUN 34*  --    < > 33*   < > 33* 35* 39* 35* 37*  CREATININE 2.46*  --    < >  2.30*   < > 2.08* 1.93* 2.39* 2.56* 2.47*  CALCIUM 8.3*  --    < > 8.4*   < > 9.0 8.7* 9.2 8.9 8.5*  MG 1.8 1.9  --  2.0  --   --   --  2.0  --  1.9  PHOS  --   --   --   --   --   --   --   --   --  3.2   < > = values in this interval not displayed.   GFR: Estimated Creatinine Clearance: 50.8 mL/min (A) (by C-G formula based on SCr of 2.47 mg/dL (H)). Liver Function Tests: Recent Labs  Lab 10/04/23 0400  AST 60*  ALT 43  ALKPHOS 202*  BILITOT 2.8*  PROT 6.6  ALBUMIN 2.5*   No results for input(s): "LIPASE", "AMYLASE" in the last 168 hours. No results for input(s): "AMMONIA" in the last 168 hours. Coagulation Profile: No results for input(s): "INR", "PROTIME" in the last 168 hours. Cardiac Enzymes: No results for input(s): "CKTOTAL", "CKMB", "CKMBINDEX", "TROPONINI" in the last 168 hours. BNP (last 3 results) No results for input(s): "PROBNP" in the last 8760 hours. HbA1C: No results for input(s): "HGBA1C" in the last 72 hours. CBG: No results for input(s): "GLUCAP" in the last 168 hours. Lipid Profile: No results for input(s): "CHOL", "HDL", "LDLCALC", "TRIG", "CHOLHDL", "LDLDIRECT" in the last 72 hours. Thyroid Function Tests: No results for input(s): "TSH", "T4TOTAL", "FREET4", "T3FREE", "THYROIDAB" in the last 72 hours. Anemia Panel: No results for input(s): "VITAMINB12", "FOLATE", "FERRITIN", "TIBC", "IRON", "RETICCTPCT" in the last 72 hours. Sepsis Labs: Recent Labs  Lab 10/05/23 0002  LATICACIDVEN 1.8    Recent Results (from the past 240 hour(s))  Culture, blood (Routine X 2) w Reflex to ID Panel     Status: None  (Preliminary result)   Collection Time: 10/05/23 12:02 AM   Specimen: BLOOD RIGHT ARM  Result Value Ref Range Status   Specimen Description BLOOD RIGHT ARM  Final   Special Requests   Final    BOTTLES DRAWN AEROBIC AND ANAEROBIC Blood Culture results may not be optimal due to an excessive volume of blood received in culture bottles   Culture   Final    NO GROWTH < 12 HOURS Performed at [facility], [address] [place], [place] [address]    Report Status PENDING  Incomplete  Culture, blood (Routine X 2) w Reflex to ID Panel     Status: None (Preliminary result)   Collection Time: 10/05/23 12:05 AM   Specimen: BLOOD LEFT ARM  Result Value Ref Range Status   Specimen Description BLOOD LEFT ARM  Final   Special Requests   Final    BOTTLES DRAWN AEROBIC AND ANAEROBIC Blood Culture results may not be optimal due to an excessive volume of blood received in culture bottles   Culture   Final    NO GROWTH < 12 HOURS Performed at [facility], [address] [place], [place] [address]    Report Status PENDING  Incomplete     Radiology Studies: DG CHEST PORT 1 VIEW  Result Date: 10/05/2023 CLINICAL DATA:  Fevers EXAM: PORTABLE CHEST 1 VIEW COMPARISON:  09/29/2023 FINDINGS: Cardiac shadows within normal limits. Defibrillator is noted and stable. Lungs are well aerated bilaterally. Mild central vascular congestion is noted without edema. No bony abnormality is seen. IMPRESSION: Mild central vascular congestion without edema. Electronically Signed   By: Alcide Clever  M.D.   On: 10/05/2023 01:39    Scheduled Meds:  acetaZOLAMIDE  500 mg Oral Once   allopurinol  200 mg Oral Daily   amiodarone  200 mg Oral Daily   apixaban  5 mg Oral BID   empagliflozin  10 mg Oral Daily   levothyroxine  50 mcg Oral QAC breakfast   mexiletine  150 mg Oral Q12H   pantoprazole  40 mg Oral BID   potassium chloride  40 mEq Oral Once   potassium chloride  40 mEq Oral Once   spironolactone  25 mg  Oral Daily   Continuous Infusions:  ceFEPime (MAXIPIME) IV     furosemide     furosemide     metronidazole 500 mg (10/05/23 0932)   [START ON 10/06/2023] vancomycin       LOS: 5 days    Time spent: 35 mins    Willeen Niece, MD [facility]   If 7PM-7AM, please contact night-coverage

## 2023-10-06 ENCOUNTER — Inpatient Hospital Stay (HOSPITAL_COMMUNITY): Payer: 59

## 2023-10-06 ENCOUNTER — Other Ambulatory Visit: Payer: Self-pay

## 2023-10-06 DIAGNOSIS — I5023 Acute on chronic systolic (congestive) heart failure: Secondary | ICD-10-CM | POA: Diagnosis not present

## 2023-10-06 DIAGNOSIS — M7989 Other specified soft tissue disorders: Secondary | ICD-10-CM | POA: Diagnosis not present

## 2023-10-06 LAB — BASIC METABOLIC PANEL
Anion gap: 11 (ref 5–15)
Anion gap: 12 (ref 5–15)
BUN: 44 mg/dL — ABNORMAL HIGH (ref 6–20)
BUN: 49 mg/dL — ABNORMAL HIGH (ref 6–20)
CO2: 29 mmol/L (ref 22–32)
CO2: 25 mmol/L (ref 22–32)
Calcium: 8.3 mg/dL — ABNORMAL LOW (ref 8.9–10.3)
Calcium: 8.4 mg/dL — ABNORMAL LOW (ref 8.9–10.3)
Chloride: 91 mmol/L — ABNORMAL LOW (ref 98–111)
Chloride: 94 mmol/L — ABNORMAL LOW (ref 98–111)
Creatinine, Ser: 2.96 mg/dL — ABNORMAL HIGH (ref 0.61–1.24)
Creatinine, Ser: 3.03 mg/dL — ABNORMAL HIGH (ref 0.61–1.24)
GFR, Estimated: 27 mL/min — ABNORMAL LOW (ref >60)
GFR, Estimated: 26 mL/min — ABNORMAL LOW (ref >60)
Glucose, Bld: 151 mg/dL — ABNORMAL HIGH (ref 70–99)
Glucose, Bld: 183 mg/dL — ABNORMAL HIGH (ref 70–99)
Potassium: 3.5 mmol/L (ref 3.5–5.1)
Potassium: 3.6 mmol/L (ref 3.5–5.1)
Sodium: 131 mmol/L — ABNORMAL LOW (ref 135–145)
Sodium: 131 mmol/L — ABNORMAL LOW (ref 135–145)

## 2023-10-06 LAB — CBC
HCT: 42.2 % (ref 39.0–52.0)
Hemoglobin: 13 g/dL (ref 13.0–17.0)
MCH: 26.2 pg (ref 26.0–34.0)
MCHC: 30.8 g/dL (ref 30.0–36.0)
MCV: 84.9 fL (ref 80.0–100.0)
Platelets: 179 10*3/uL (ref 150–400)
RBC: 4.97 MIL/uL (ref 4.22–5.81)
RDW: 16.3 % — ABNORMAL HIGH (ref 11.5–15.5)
WBC: 15.7 10*3/uL — ABNORMAL HIGH (ref 4.0–10.5)
nRBC: 0 % (ref 0.0–0.2)

## 2023-10-06 LAB — COOXEMETRY PANEL
Carboxyhemoglobin: 1.8 % — ABNORMAL HIGH (ref 0.5–1.5)
Methemoglobin: <0.7 % (ref 0.0–1.5)
O2 Saturation: 73 %
Total hemoglobin: 13.5 g/dL (ref 12.0–16.0)

## 2023-10-06 LAB — URINE CULTURE: Culture: <10000 — AB

## 2023-10-06 LAB — MAGNESIUM: Magnesium: 2.2 mg/dL (ref 1.7–2.4)

## 2023-10-06 LAB — URIC ACID: Uric Acid, Serum: 7 mg/dL (ref 3.7–8.6)

## 2023-10-06 MED ORDER — FUROSEMIDE 10 MG/ML IJ SOLN
30.0000 mg/h | INTRAVENOUS | Status: DC
  Administered 2023-10-06 – 2023-10-07 (×3): 20 mg/h via INTRAVENOUS
  Administered 2023-10-07 – 2023-10-14 (×22): 30 mg/h via INTRAVENOUS
  Filled 2023-10-06 (×28): qty 20

## 2023-10-06 MED ORDER — POTASSIUM CHLORIDE CRYS ER 20 MEQ PO TBCR
40.0000 meq | EXTENDED_RELEASE_TABLET | ORAL | Status: AC
  Administered 2023-10-06 (×2): 40 meq via ORAL
  Filled 2023-10-06 (×2): qty 2

## 2023-10-06 MED ORDER — SODIUM CHLORIDE 0.9% FLUSH: 10.0000 mL | INTRAVENOUS | Status: DC | PRN

## 2023-10-06 MED ORDER — LEVOTHYROXINE SODIUM 50 MCG PO TABS
50.0000 ug | ORAL_TABLET | Freq: Every day | ORAL | Status: DC
  Administered 2023-10-06 – 2023-10-24 (×19): 50 ug via ORAL
  Filled 2023-10-06 (×20): qty 1

## 2023-10-06 MED ORDER — SODIUM CHLORIDE 0.9% FLUSH
10.0000 mL | Freq: Two times a day (BID) | INTRAVENOUS | Status: DC
  Administered 2023-10-06 – 2023-10-10 (×7): 10 mL
  Administered 2023-10-10: 20 mL
  Administered 2023-10-11 – 2023-10-13 (×6): 10 mL
  Administered 2023-10-14: 20 mL
  Administered 2023-10-15 – 2023-10-24 (×15): 10 mL

## 2023-10-06 MED ORDER — POTASSIUM CHLORIDE CRYS ER 20 MEQ PO TBCR
40.0000 meq | EXTENDED_RELEASE_TABLET | ORAL | Status: AC
  Administered 2023-10-06 – 2023-10-07 (×2): 40 meq via ORAL
  Filled 2023-10-06 (×2): qty 2

## 2023-10-06 MED ORDER — CHLORHEXIDINE GLUCONATE CLOTH 2 % EX PADS
6.0000 | MEDICATED_PAD | Freq: Every day | CUTANEOUS | Status: DC
  Administered 2023-10-06 – 2023-10-24 (×19): 6 via TOPICAL

## 2023-10-06 NOTE — Progress Notes (Signed)
Peripherally Inserted Central Catheter Placement  The IV Nurse has discussed with the patient and/or persons authorized to consent for the patient, the purpose of this procedure and the potential benefits and risks involved with this procedure.  The benefits include less needle sticks, lab draws from the catheter, and the patient may be discharged home with the catheter. Risks include, but not limited to, infection, bleeding, blood clot (thrombus formation), and puncture of an artery; nerve damage and irregular heartbeat and possibility to perform a PICC exchange if needed/ordered by physician.  Alternatives to this procedure were also discussed.  Bard Power PICC patient education guide, fact sheet on infection prevention and patient information card has been provided to patient /or left at bedside.    PICC Placement Documentation  PICC Double Lumen 10/06/23 Right Basilic 43 cm 0 cm (Active)  Indication for Insertion or Continuance of Line Vasoactive infusions;Chronic illness with exacerbations (CF, Sickle Cell, etc.) 10/06/23 1841  Exposed Catheter (cm) 0 cm 10/06/23 1841  Site Assessment Clean, Dry, Intact 10/06/23 1841  Lumen #1 Status Flushed;Saline locked;Blood return noted 10/06/23 1841  Lumen #2 Status Flushed;Saline locked;Blood return noted 10/06/23 1841  Dressing Type Transparent;Securing device 10/06/23 1841  Dressing Status Antimicrobial disc in place;Clean, Dry, Intact 10/06/23 1841  Line Care Connections checked and tightened 10/06/23 1841  Line Adjustment (NICU/IV Team Only) No 10/06/23 1841  Dressing Intervention New dressing;Adhesive placed at insertion site (IV team only) 10/06/23 1841  Dressing Change Due 10/13/23 10/06/23 1841       Mitzi Lilja, Lajean Manes 10/06/2023, 6:42 PM

## 2023-10-06 NOTE — Progress Notes (Addendum)
Advanced Heart Failure Rounding Note   Subjective:    2.1L UOP with Lasix 160 IV BID.  sCr continuing to rise sCr 2.9 with 5lb weight gain overnight.  Complains of mild headache for 2 days and pain in R knee. No SOB. No CP. Frustrated by lower extremity edema.   Objective:   Echo 11/19 EF 35-40%, LV with GHK, RV mod reduced, mildly elevated PASP, RVSP ~39, trivial MR   Weight Range:  Vital Signs:   Temp:  [97.5 F (36.4 C)-98.9 F (37.2 C)] 98.9 F (37.2 C) (11/22 0854) Pulse Rate:  [100] 100 (11/21 1211) Resp:  [16-20] 16 (11/22 0854) BP: (100-116)/(62-77) 116/71 (11/22 0854) SpO2:  [96 %-99 %] 97 % (11/22 0854) Weight:  [125.7 kg] 125.7 kg (11/22 0658) Last BM Date : 10/06/23  Weight change: Filed Weights   10/04/23 0456 10/05/23 0500 10/06/23 0658  Weight: 126.6 kg 123.5 kg 125.7 kg    Intake/Output:   Intake/Output Summary (Last 24 hours) at 10/06/2023 1131 Last data filed at 10/06/2023 0904 Gross per 24 hour  Intake 1166 ml  Output 2220 ml  Net -1054 ml     Physical Exam: General: Obese appearing. No distress on RA HEENT: neck supple.   Cardiac: JVP to jaw. S1 and S2 present. No murmurs or rub. Resp: Lung sounds clear and equal B/L Abdomen: Soft, non-tender, non-distended. + BS. Obese. Extremities: Warm and dry. No rash, cyanosis.  3+ edema to thighs and BLE, R>L Neuro: Alert and oriented x3. Affect pleasant. Moves all extremities without difficulty.  Telemetry: A Flutter 90-100s (personally reviewed)    Labs: Basic Metabolic Panel: Recent Labs  Lab 09/30/23 0518 10/01/23 0319 10/02/23 0454 10/02/23 1454 10/04/23 0400 10/04/23 2102 10/05/23 0311 10/05/23 1934 10/06/23 0314  NA  --    < > 138   < > 138 138 135 133* 131*  K  --    < > 2.9*   < > 4.9 3.4* 2.9* 3.4* 3.5  CL  --    < > 97*   < > 93* 90* 91* 92* 91*  CO2  --    < > 29   < > 26 34* 30 28 29   GLUCOSE  --    < > 102*   < > 84 128* 106* 151* 151*  BUN  --    < > 33*   < > 39*  35* 37* 41* 44*  CREATININE  --    < > 2.30*   < > 2.39* 2.56* 2.47* 2.63* 2.96*  CALCIUM  --    < > 8.4*   < > 9.2 8.9 8.5* 8.3* 8.3*  MG 1.9  --  2.0  --  2.0  --  1.9  --  2.2  PHOS  --   --   --   --   --   --  3.2  --   --    < > = values in this interval not displayed.    Liver Function Tests: Recent Labs  Lab 10/04/23 0400  AST 60*  ALT 43  ALKPHOS 202*  BILITOT 2.8*  PROT 6.6  ALBUMIN 2.5*   No results for input(s): "LIPASE", "AMYLASE" in the last 168 hours. No results for input(s): "AMMONIA" in the last 168 hours.  CBC: Recent Labs  Lab 09/30/23 0513 10/01/23 0319 10/02/23 0454 10/05/23 0002 10/05/23 0311 10/06/23 0314  WBC 9.0 10.2 8.3 21.6* 17.9* 15.7*  NEUTROABS 7.5  --   --   --   --   --  HGB 13.9 13.7 15.2 12.7* 13.1 13.0  HCT 44.3 43.1 48.5 38.6* 41.1 42.2  MCV 85.2 85.0 84.9 81.8 83.0 84.9  PLT 310 302 298 214 206 179    Cardiac Enzymes: No results for input(s): "CKTOTAL", "CKMB", "CKMBINDEX", "TROPONINI" in the last 168 hours.  BNP: BNP (last 3 results) Recent Labs    04/18/23 1610 05/23/23 1618 09/30/23 0513  BNP 171.7* 209.9* 222.1*    ProBNP (last 3 results) No results for input(s): "PROBNP" in the last 8760 hours.  Other results:  Imaging: DG CHEST PORT 1 VIEW  Result Date: 10/05/2023 CLINICAL DATA:  Fevers EXAM: PORTABLE CHEST 1 VIEW COMPARISON:  09/29/2023 FINDINGS: Cardiac shadows within normal limits. Defibrillator is noted and stable. Lungs are well aerated bilaterally. Mild central vascular congestion is noted without edema. No bony abnormality is seen. IMPRESSION: Mild central vascular congestion without edema. Electronically Signed   By: Alcide Clever M.D.   On: 10/05/2023 01:39     Medications:    Scheduled Medications:  allopurinol  200 mg Oral Daily   amiodarone  200 mg Oral Daily   apixaban  5 mg Oral BID   empagliflozin  10 mg Oral Daily   levothyroxine  50 mcg Oral Q0600   mexiletine  150 mg Oral Q12H    pantoprazole  40 mg Oral BID   potassium chloride  40 mEq Oral Q4H    Infusions:  ceFEPime (MAXIPIME) IV 2 g (10/06/23 0522)   furosemide (LASIX) 200 mg in dextrose 5 % 100 mL (2 mg/mL) infusion      PRN Medications: acetaminophen, baclofen, ondansetron (ZOFRAN) IV, oxyCODONE-acetaminophen, phenol, sodium chloride  Assessment/Plan:   1. 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 Ratio 1.26 read as equivocal.  SPEP negative - cMRI repeated (11/22): LVEF 43%, RVEF 44%, LGE concerning for sarcoid.  - Echo (4/23): EF 35-40% Moderate RV dysfunction - Has seen geneticist, Dr. Jomarie Longs. Work up c/w LMNA.  - Admitted with NYHA IIIb symptoms and 40 pound weight gain - Echo 11/19 EF 35-40%, LV with GHK, RV mod reduced, mildly elevated PASP, RVSP ~39, trivial MR  - Severely volume overloaded. UOP dropped off with intermittent dosing of Lasix.  - sCr now 2.9 with 5lb weight gain overnight. Low threshold to start Milrinone if sCr and UOP not improved with Lasix gtt 20/hr. - Continue Jardiance 10 - Hold Spiro given rising sCr - Has been off b-blocker due to bradycardia and heart block - Off ARNi and ARB (hypotension and N/V in the past) - can rechallenge as able   2. Syncope/VT - 04/23 > secondary to VT - s/p ICD 4/23. - Continue mexiletine 150 mg bid   - Continue amiodarone 200 mg daiy - Followed by Dr. Ladona Ridgel  3. AKI on CKD Stage IIIb - Due to cardiorenal syndrome - Baseline SCr 1.7-2.0 - Admit Scr 2.46 -> 2.09>2.3>2.0>1.93>2.39>2.63>2.96 - Diuresis as above   4. Chronic AF/AFL - S/p AFL ablation 5/19 with Dr. Ladona Ridgel. - Continue Eliquis 5 mg bid. No bleeding issues. - Continue amiodarone 200 daily - Plan for DCCV once better diuresed.    5.  OSA: Continue CPAP   6. PVCs/VT - Continue mexiletine + amiodarone. - Followed by EP   7. Obesity - Body mass index is Body mass index is 42.15 kg/m. -  Consider GLP1RA  8. Hypokalemia - K 3.4, replete - BMET pending  9.  ID - per primary. WBC peaked at 21.6. today at 17.9 - Tmax 101.9 - UA (-), PCXR (-), RVP (-) - Blood cultures - NGTD - Broad spectrum abx   10. Gout - R>L knee/leg swelling  - Pain noted at site - Check uric acid  Length of Stay: 6  [place] Lee AGACNP-BC 10/06/2023, 11:31 AM  Advanced Heart Failure Team Pager [phone] (M-F; 7a - 4p)  Please contact [facility] for night-coverage after hours (4p -7a ) and weekends on [email or web address]   Patient seen and examined with the above-signed Advanced Practice Provider and/or Housestaff. I personally reviewed laboratory data, imaging studies and relevant notes. I independently examined the patient and formulated the important aspects of the plan. I have edited the note to reflect any of my changes or salient points. I have personally discussed the plan with the patient and/or family.  Diuresis has slowed down. Urine dark. Scr worsening.   Denies CP or SOB.   General:  Sitting up on side of bed No resp difficulty HEENT: normal Neck: supple. JVP to ear Carotids 2+ bilat; no bruits. No lymphadenopathy or thryomegaly appreciated. Cor: PMI nondisplaced. Regular rate & rhythm. No rubs, gallops or murmurs. Lungs: clear Abdomen:  obese soft, nontender, + distended. No hepatosplenomegaly. No bruits or masses. Good bowel sounds. Extremities: no cyanosis, clubbing, rash, 2-3+ edema to thighs Neuro: alert & orientedx3, cranial nerves grossly intact. moves all 4 extremities w/o difficulty. Affect pleasant  I worry he has low output R>L HF. Increase lasix.   Will place PICC to assess co-ox and CVP. Will likely need milrinone. Consider RHC prior to d/c.  AF is longstanding. Doubt role for DCCV as it has not worked in past.   Arvilla Meres, MD  4:55 PM

## 2023-10-06 NOTE — Progress Notes (Addendum)
PROGRESS NOTE    Bradley Powell  WGN:[id number] DOB: 01/22/1981 DOA: 09/29/2023 PCP: [name], MD   Brief Narrative:  This 42 year old male with past medical history of paroxysmal atrial flutter s/p remote ablation, chronic combined systolic/diastolic CHF, NICM, OSA and morbid obesity.  He presents to the ER with complaints of increasing fluid retention on his legs up to his stomach.  Per patient his been compliant with his medications.  On lasix gtt directed by CHF team.  Cardiology is following. Plan is  to have DCCV when adequately diuresed.   Assessment & Plan:   Principal Problem:   Acute on chronic congestive heart failure (HCC) Active Problems:   Hepatic cirrhosis (HCC)   Edema   Morbid obesity (HCC)   Gout   NICM (nonischemic cardiomyopathy) (HCC)   Paroxysmal atrial fibrillation (HCC)   Acute on chronic systolic (congestive) heart failure (HCC)   ICD (implantable cardioverter-defibrillator) in place   Fluid overload  Acute on chronic systolic heart failure: Echo >  LVEF 35-40%.  Patient was started on Lasix gtt. as per CHF protocol. Medications adjusted by CHF team. Strict I's and O's, daily weights Cardiology consult appreciated. Down 27L overall and 16lbs. Patient wants to have a break from Lasix.  Stopped as per cardiology. Lasix gtt switched to Lasix 160 mg twice daily plus Diamox. Appears severely volume overloaded,  urine output dropped with intermittent dosing of Lasix. Serum creatinine up to 2.9 with 5 pound weight gain overnight. Lasix infusion resumed.  May require milrinone infusion if urine output not improved   Intake/Output Summary (Last 24 hours) at 10/06/2023 1353 Last data filed at 10/06/2023 0904 Gross per 24 hour  Intake 1166 ml  Output 2220 ml  Net -1054 ml    AKI on CKD stage 4: Serum creatinine remained around baseline. Creatinine 2.46 -> 2.09>2.3>2.0>1.93>2.39>2.63>2.96    Permanent atrial fibrillation Continue Amiodarone/  Eliquis. TEE/DCCV is delayed due to patient having fever last night. DCCV would be pursued once patient is adequately diuresed.   Hepatic cirrhosis (HCC) Likely secondary to NASH.   Morbid obesity (HCC)// OSA Estimated body mass index is 42.25 kg/m as calculated from the following:   Height as of this encounter: 5\' 8"  (1.727 m).   Weight as of this encounter: 126.1 kg.    Hypokalemia Replace aggressively. -Mg normal  Fever: Patient spiked fever 11/21, pancultures were sent. Chest x-ray, UA unremarkable. Empirically started on vancomycin, cefepime and Flagyl. Blood cultures no growth, urine cultures contaminated. Vancomycin discontinued.  DVT prophylaxis: Eliquis Code Status: Full code Family Communication: No family at bedside Disposition Plan:   Admitted for acute on chronic systolic CHF.  Cardiology is consulted,  requiring IV diuresis. Also found to have A-fib, requiring DCCV once adequately diuresed.  Consultants:  Cardiology  Procedures:  Echocardiogram.  Antimicrobials: Anti-infectives (From admission, onward)    Start     Dose/Rate Route Frequency Ordered Stop   10/06/23 0600  vancomycin (VANCOCIN) IVPB 1000 mg/200 mL premix  Status:  Discontinued        1,000 mg 200 mL/hr over 60 Minutes Intravenous Every 24 hours 10/05/23 0412 10/06/23 1053   10/05/23 1600  ceFEPIme (MAXIPIME) 2 g in sodium chloride 0.9 % 100 mL IVPB        2 g 200 mL/hr over 30 Minutes Intravenous Every 12 hours 10/05/23 0412     10/05/23 0515  vancomycin (VANCOCIN) IVPB 1000 mg/200 mL premix        1,000 mg 200 mL/hr over  60 Minutes Intravenous Every 1 hr x 2 10/05/23 0415 10/05/23 0927   10/05/23 0500  metroNIDAZOLE (FLAGYL) IVPB 500 mg  Status:  Discontinued        500 mg 100 mL/hr over 60 Minutes Intravenous Every 12 hours 10/05/23 0402 10/06/23 1053   10/05/23 0500  vancomycin (VANCOCIN) IVPB 1000 mg/200 mL premix  Status:  Discontinued        1,000 mg 200 mL/hr over 60 Minutes  Intravenous Every 1 hr x 2 10/05/23 0412 10/05/23 0414   10/05/23 0500  ceFEPIme (MAXIPIME) 2 g in sodium chloride 0.9 % 100 mL IVPB        2 g 200 mL/hr over 30 Minutes Intravenous  Once 10/05/23 0412 10/05/23 0926   10/05/23 0500  vancomycin (VANCOREADY) IVPB 2000 mg/400 mL  Status:  Discontinued        2,000 mg 200 mL/hr over 120 Minutes Intravenous  Once 10/05/23 0414 10/05/23 0415       Subjective: Patient was seen and examined at bedside.  Overnight events noted.   Patient is morbidly obese, severely deconditioned, remains on supplemental oxygen. Urine output decreased on intermittent dosing of Lasix, Patient has gained 5 pounds overnight.  Objective: Vitals:   10/05/23 2041 10/06/23 0120 10/06/23 0658 10/06/23 0854  BP: 104/68 107/77  116/71  Pulse:      Resp: 20 18  16   Temp: 98.4 F (36.9 C) (!) 97.5 F (36.4 C)  98.9 F (37.2 C)  TempSrc: Oral Oral  Oral  SpO2: 99% 96%  97%  Weight:   125.7 kg   Height:        Intake/Output Summary (Last 24 hours) at 10/06/2023 1353 Last data filed at 10/06/2023 0904 Gross per 24 hour  Intake 1166 ml  Output 2220 ml  Net -1054 ml   Filed Weights   10/04/23 0456 10/05/23 0500 10/06/23 0658  Weight: 126.6 kg 123.5 kg 125.7 kg    Examination:  General exam: Appears comfortable, deconditioned, not in any acute distress. Respiratory system: CTA bilaterally.  Respiratory effort normal.  RR 14 Cardiovascular system: S1 & S2 heard, irregular rhythm, no murmur . Gastrointestinal system: Abdomen is nondistended, soft and non tender. Normal bowel sounds heard. Central nervous system: Alert and oriented x3. No focal neurological deficits. Extremities: Edema+, no cyanosis, no clubbing Skin: No rashes, lesions or ulcers Psychiatry: Judgement and insight appear normal. Mood & affect appropriate.     Data Reviewed: I have personally reviewed following labs and imaging studies  CBC: Recent Labs  Lab 09/30/23 0513 10/01/23 0319  10/02/23 0454 10/05/23 0002 10/05/23 0311 10/06/23 0314  WBC 9.0 10.2 8.3 21.6* 17.9* 15.7*  NEUTROABS 7.5  --   --   --   --   --   HGB 13.9 13.7 15.2 12.7* 13.1 13.0  HCT 44.3 43.1 48.5 38.6* 41.1 42.2  MCV 85.2 85.0 84.9 81.8 83.0 84.9  PLT 310 302 298 214 206 179   Basic Metabolic Panel: Recent Labs  Lab 09/30/23 0518 10/01/23 0319 10/02/23 0454 10/02/23 1454 10/04/23 0400 10/04/23 2102 10/05/23 0311 10/05/23 1934 10/06/23 0314  NA  --    < > 138   < > 138 138 135 133* 131*  K  --    < > 2.9*   < > 4.9 3.4* 2.9* 3.4* 3.5  CL  --    < > 97*   < > 93* 90* 91* 92* 91*  CO2  --    < >  29   < > 26 34* 30 28 29   GLUCOSE  --    < > 102*   < > 84 128* 106* 151* 151*  BUN  --    < > 33*   < > 39* 35* 37* 41* 44*  CREATININE  --    < > 2.30*   < > 2.39* 2.56* 2.47* 2.63* 2.96*  CALCIUM  --    < > 8.4*   < > 9.2 8.9 8.5* 8.3* 8.3*  MG 1.9  --  2.0  --  2.0  --  1.9  --  2.2  PHOS  --   --   --   --   --   --  3.2  --   --    < > = values in this interval not displayed.   GFR: Estimated Creatinine Clearance: 42.8 mL/min (A) (by C-G formula based on SCr of 2.96 mg/dL (H)). Liver Function Tests: Recent Labs  Lab 10/04/23 0400  AST 60*  ALT 43  ALKPHOS 202*  BILITOT 2.8*  PROT 6.6  ALBUMIN 2.5*   No results for input(s): "LIPASE", "AMYLASE" in the last 168 hours. No results for input(s): "AMMONIA" in the last 168 hours. Coagulation Profile: No results for input(s): "INR", "PROTIME" in the last 168 hours. Cardiac Enzymes: No results for input(s): "CKTOTAL", "CKMB", "CKMBINDEX", "TROPONINI" in the last 168 hours. BNP (last 3 results) No results for input(s): "PROBNP" in the last 8760 hours. HbA1C: No results for input(s): "HGBA1C" in the last 72 hours. CBG: No results for input(s): "GLUCAP" in the last 168 hours. Lipid Profile: No results for input(s): "CHOL", "HDL", "LDLCALC", "TRIG", "CHOLHDL", "LDLDIRECT" in the last 72 hours. Thyroid Function Tests: No results  for input(s): "TSH", "T4TOTAL", "FREET4", "T3FREE", "THYROIDAB" in the last 72 hours. Anemia Panel: No results for input(s): "VITAMINB12", "FOLATE", "FERRITIN", "TIBC", "IRON", "RETICCTPCT" in the last 72 hours. Sepsis Labs: Recent Labs  Lab 10/05/23 0002  LATICACIDVEN 1.8    Recent Results (from the past 240 hour(s))  Culture, blood (Routine X 2) w Reflex to ID Panel     Status: None (Preliminary result)   Collection Time: 10/05/23 12:02 AM   Specimen: BLOOD RIGHT ARM  Result Value Ref Range Status   Specimen Description BLOOD RIGHT ARM  Final   Special Requests   Final    BOTTLES DRAWN AEROBIC AND ANAEROBIC Blood Culture results may not be optimal due to an excessive volume of blood received in culture bottles   Culture   Final    NO GROWTH 1 DAY Performed at [facility], [address], [place], [place] [address]    Report Status PENDING  Incomplete  Culture, blood (Routine X 2) w Reflex to ID Panel     Status: None (Preliminary result)   Collection Time: 10/05/23 12:05 AM   Specimen: BLOOD LEFT ARM  Result Value Ref Range Status   Specimen Description BLOOD LEFT ARM  Final   Special Requests   Final    BOTTLES DRAWN AEROBIC AND ANAEROBIC Blood Culture results may not be optimal due to an excessive volume of blood received in culture bottles   Culture   Final    NO GROWTH 1 DAY Performed at [facility], [address] [place], [place] [address]    Report Status PENDING  Incomplete  Urine Culture (for pregnant, neutropenic or urologic patients or patients with an indwelling urinary catheter)     Status: Abnormal  Collection Time: 10/05/23  5:42 AM   Specimen: Urine, Clean Catch  Result Value Ref Range Status   Specimen Description URINE, CLEAN CATCH  Final   Special Requests NONE  Final   Culture (A)  Final    <10,000 COLONIES/mL INSIGNIFICANT GROWTH Performed at [facility], [address], [place], [place] [address]    Report Status 10/06/2023  FINAL  Final  Respiratory (~20 pathogens) panel by PCR     Status: None   Collection Time: 10/05/23 12:02 PM   Specimen: Nasopharyngeal Swab; Respiratory  Result Value Ref Range Status   Adenovirus NOT DETECTED NOT DETECTED Final   Coronavirus 229E NOT DETECTED NOT DETECTED Final    Comment: (NOTE) The Coronavirus on the Respiratory Panel, DOES NOT test for the novel  Coronavirus (2019 nCoV)    Coronavirus HKU1 NOT DETECTED NOT DETECTED Final   Coronavirus NL63 NOT DETECTED NOT DETECTED Final   Coronavirus OC43 NOT DETECTED NOT DETECTED Final   Metapneumovirus NOT DETECTED NOT DETECTED Final   Rhinovirus / Enterovirus NOT DETECTED NOT DETECTED Final   Influenza A NOT DETECTED NOT DETECTED Final   Influenza B NOT DETECTED NOT DETECTED Final   Parainfluenza Virus 1 NOT DETECTED NOT DETECTED Final   Parainfluenza Virus 2 NOT DETECTED NOT DETECTED Final   Parainfluenza Virus 3 NOT DETECTED NOT DETECTED Final   Parainfluenza Virus 4 NOT DETECTED NOT DETECTED Final   Respiratory Syncytial Virus NOT DETECTED NOT DETECTED Final   Bordetella pertussis NOT DETECTED NOT DETECTED Final   Bordetella Parapertussis NOT DETECTED NOT DETECTED Final   Chlamydophila pneumoniae NOT DETECTED NOT DETECTED Final   Mycoplasma pneumoniae NOT DETECTED NOT DETECTED Final    Comment: Performed at [facility], [address] [place], [place] [address]     Radiology Studies: [place] EKG SITE RITE  Result Date: 10/06/2023 If [facility] image not attached, placement could not be confirmed due to current cardiac rhythm.  DG CHEST PORT 1 VIEW  Result Date: 10/05/2023 CLINICAL DATA:  Fevers EXAM: PORTABLE CHEST 1 VIEW COMPARISON:  09/29/2023 FINDINGS: Cardiac shadows within normal limits. Defibrillator is noted and stable. Lungs are well aerated bilaterally. Mild central vascular congestion is noted without edema. No bony abnormality is seen. IMPRESSION: Mild central vascular congestion without edema.  Electronically Signed   By: Alcide Clever M.D.   On: 10/05/2023 01:39    Scheduled Meds:  allopurinol  200 mg Oral Daily   amiodarone  200 mg Oral Daily   apixaban  5 mg Oral BID   empagliflozin  10 mg Oral Daily   levothyroxine  50 mcg Oral Q0600   mexiletine  150 mg Oral Q12H   pantoprazole  40 mg Oral BID   potassium chloride  40 mEq Oral Q4H   Continuous Infusions:  ceFEPime (MAXIPIME) IV 2 g (10/06/23 0522)   furosemide (LASIX) 200 mg in dextrose 5 % 100 mL (2 mg/mL) infusion 20 mg/hr (10/06/23 1228)     LOS: 6 days    Time spent: 50 mins    Willeen Niece, MD [facility]   If 7PM-7AM, please contact night-coverage

## 2023-10-07 DIAGNOSIS — I5023 Acute on chronic systolic (congestive) heart failure: Secondary | ICD-10-CM | POA: Diagnosis not present

## 2023-10-07 LAB — BASIC METABOLIC PANEL
Anion gap: 12 (ref 5–15)
BUN: 49 mg/dL — ABNORMAL HIGH (ref 6–20)
CO2: 24 mmol/L (ref 22–32)
Calcium: 8.6 mg/dL — ABNORMAL LOW (ref 8.9–10.3)
Chloride: 97 mmol/L — ABNORMAL LOW (ref 98–111)
Creatinine, Ser: 2.88 mg/dL — ABNORMAL HIGH (ref 0.61–1.24)
GFR, Estimated: 27 mL/min — ABNORMAL LOW (ref >60)
Glucose, Bld: 168 mg/dL — ABNORMAL HIGH (ref 70–99)
Potassium: 3.3 mmol/L — ABNORMAL LOW (ref 3.5–5.1)
Sodium: 133 mmol/L — ABNORMAL LOW (ref 135–145)

## 2023-10-07 LAB — CBC
HCT: 38.9 % — ABNORMAL LOW (ref 39.0–52.0)
Hemoglobin: 12.3 g/dL — ABNORMAL LOW (ref 13.0–17.0)
MCH: 26.4 pg (ref 26.0–34.0)
MCHC: 31.6 g/dL (ref 30.0–36.0)
MCV: 83.5 fL (ref 80.0–100.0)
Platelets: 204 10*3/uL (ref 150–400)
RBC: 4.66 MIL/uL (ref 4.22–5.81)
RDW: 15.9 % — ABNORMAL HIGH (ref 11.5–15.5)
WBC: 11.7 10*3/uL — ABNORMAL HIGH (ref 4.0–10.5)
nRBC: 0 % (ref 0.0–0.2)

## 2023-10-07 LAB — COOXEMETRY PANEL
Carboxyhemoglobin: 1.9 % — ABNORMAL HIGH (ref 0.5–1.5)
Methemoglobin: <0.7 % (ref 0.0–1.5)
O2 Saturation: 78 %
Total hemoglobin: 12.9 g/dL (ref 12.0–16.0)

## 2023-10-07 LAB — PHOSPHORUS: Phosphorus: 3.4 mg/dL (ref 2.5–4.6)

## 2023-10-07 LAB — MAGNESIUM: Magnesium: 2.2 mg/dL (ref 1.7–2.4)

## 2023-10-07 MED ORDER — POTASSIUM CHLORIDE CRYS ER 20 MEQ PO TBCR
40.0000 meq | EXTENDED_RELEASE_TABLET | Freq: Once | ORAL | Status: AC
  Administered 2023-10-07: 40 meq via ORAL
  Filled 2023-10-07: qty 2

## 2023-10-07 MED ORDER — METOLAZONE 5 MG PO TABS
5.0000 mg | ORAL_TABLET | Freq: Once | ORAL | Status: AC
  Administered 2023-10-07: 5 mg via ORAL
  Filled 2023-10-07: qty 1

## 2023-10-07 NOTE — Progress Notes (Signed)
Advanced Heart Failure Rounding Note   Subjective:    On lasix at 20. About 2L out yesterday but urine output slowing down.   SCr  2.96 -> 3.03 -> 2.88 . Weight up 5 pounds.   Co-ox  78%  Objective:   Echo 11/19 EF 35-40%, LV with GHK, RV mod reduced, mildly elevated PASP, RVSP ~39, trivial MR   Weight Range:  Vital Signs:   Temp:  [97.3 F (36.3 C)-98.3 F (36.8 C)] 97.4 F (36.3 C) (11/23 0827) Resp:  [16-18] 18 (11/23 0827) BP: (105-125)/(69-91) 105/91 (11/23 0827) SpO2:  [98 %-100 %] 98 % (11/23 0836) Weight:  [128.1 kg] 128.1 kg (11/23 0512) Last BM Date : 10/06/23  Weight change: Filed Weights   10/05/23 0500 10/06/23 0658 10/07/23 0512  Weight: 123.5 kg 125.7 kg 128.1 kg    Intake/Output:   Intake/Output Summary (Last 24 hours) at 10/07/2023 0924 Last data filed at 10/07/2023 1610 Gross per 24 hour  Intake 1089.93 ml  Output 1900 ml  Net -810.07 ml     Physical Exam: General: Obese male sitting up on side of bed HEENT: normal Neck: supple. JVP to ear Carotids 2+ bilat; no bruits. No lymphadenopathy or thryomegaly appreciated. Cor: PMI nondisplaced. Irregular rate & rhythm. No rubs, gallops or murmurs. Lungs: clear Abdomen: obese soft, nontender, + distended. No hepatosplenomegaly. No bruits or masses. Good bowel sounds. Extremities: no cyanosis, clubbing, rash, 3+ edema Neuro: alert & orientedx3, cranial nerves grossly intact. moves all 4 extremities w/o difficulty. Affect pleasant   Telemetry: A Flutter 80-90s (personally reviewed)    Labs: Basic Metabolic Panel: Recent Labs  Lab 10/02/23 0454 10/02/23 1454 10/04/23 0400 10/04/23 2102 10/05/23 0311 10/05/23 1934 10/06/23 0314 10/06/23 2049  NA 138   < > 138 138 135 133* 131* 131*  K 2.9*   < > 4.9 3.4* 2.9* 3.4* 3.5 3.6  CL 97*   < > 93* 90* 91* 92* 91* 94*  CO2 29   < > 26 34* 30 28 29 25   GLUCOSE 102*   < > 84 128* 106* 151* 151* 183*  BUN 33*   < > 39* 35* 37* 41* 44* 49*   CREATININE 2.30*   < > 2.39* 2.56* 2.47* 2.63* 2.96* 3.03*  CALCIUM 8.4*   < > 9.2 8.9 8.5* 8.3* 8.3* 8.4*  MG 2.0  --  2.0  --  1.9  --  2.2  --   PHOS  --   --   --   --  3.2  --   --   --    < > = values in this interval not displayed.    Liver Function Tests: Recent Labs  Lab 10/04/23 0400  AST 60*  ALT 43  ALKPHOS 202*  BILITOT 2.8*  PROT 6.6  ALBUMIN 2.5*   No results for input(s): "LIPASE", "AMYLASE" in the last 168 hours. No results for input(s): "AMMONIA" in the last 168 hours.  CBC: Recent Labs  Lab 10/02/23 0454 10/05/23 0002 10/05/23 0311 10/06/23 0314 10/07/23 0849  WBC 8.3 21.6* 17.9* 15.7* 11.7*  HGB 15.2 12.7* 13.1 13.0 12.3*  HCT 48.5 38.6* 41.1 42.2 38.9*  MCV 84.9 81.8 83.0 84.9 83.5  PLT 298 214 206 179 204    Cardiac Enzymes: No results for input(s): "CKTOTAL", "CKMB", "CKMBINDEX", "TROPONINI" in the last 168 hours.  BNP: BNP (last 3 results) Recent Labs    04/18/23 1610 05/23/23 1618 09/30/23 0513  BNP 171.7* 209.9*  222.1*    ProBNP (last 3 results) No results for input(s): "PROBNP" in the last 8760 hours.  Other results:  Imaging: DG CHEST PORT 1 VIEW  Result Date: 10/06/2023 CLINICAL DATA:  Central line placement EXAM: PORTABLE CHEST 1 VIEW COMPARISON:  Chest x-ray 10/04/2023 FINDINGS: Left-sided pacemaker is present. The heart is enlarged. Right upper extremity PICC terminates in the SVC. There is no pneumothorax. The lungs are clear. There is no pleural effusion. No acute osseous abnormalities. IMPRESSION: 1. Right upper extremity PICC terminates in the SVC. No pneumothorax. 2. Cardiomegaly. Electronically Signed   By: Darliss Cheney M.D.   On: 10/06/2023 19:23   VAS [place] LOWER EXTREMITY VENOUS (DVT)  Result Date: 10/06/2023  Lower Venous DVT Study Patient Name:  Bradley Powell [facility]  Date of Exam:   10/06/2023 Medical Rec #: [id number]           Accession #:    [id number] Date of Birth: 1981-04-15          Patient Gender: M Patient  Age:   42 years Exam Location:  [facility] Procedure:      VAS [place] LOWER EXTREMITY VENOUS (DVT) Referring Phys: [place] LEE --------------------------------------------------------------------------------  Indications: Swelling, and Edema. Other Indications: Atrial flutter. Limitations: Bandages. Comparison Study: No prior exam. Performing Technologist: Fernande Bras  Examination Guidelines: A complete evaluation includes B-mode imaging, spectral Doppler, color Doppler, and power Doppler as needed of all accessible portions of each vessel. Bilateral testing is considered an integral part of a complete examination. Limited examinations for reoccurring indications may be performed as noted. The reflux portion of the exam is performed with the patient in reverse Trendelenburg.  +---------+---------------+---------+-----------+----------+-------------------+ RIGHT    CompressibilityPhasicitySpontaneityPropertiesThrombus Aging      +---------+---------------+---------+-----------+----------+-------------------+ CFV      Full           Yes      Yes                                      +---------+---------------+---------+-----------+----------+-------------------+ SFJ      Full                                                             +---------+---------------+---------+-----------+----------+-------------------+ FV Prox  Full                                                             +---------+---------------+---------+-----------+----------+-------------------+ FV Mid   Full                                                             +---------+---------------+---------+-----------+----------+-------------------+ FV DistalFull                                                             +---------+---------------+---------+-----------+----------+-------------------+  PFV      Full                                                              +---------+---------------+---------+-----------+----------+-------------------+ POP      Full           Yes      Yes                                      +---------+---------------+---------+-----------+----------+-------------------+ PTV                                                   Not well visualized +---------+---------------+---------+-----------+----------+-------------------+ PERO                                                  Not well visualized +---------+---------------+---------+-----------+----------+-------------------+ PTVs and Peros not visualized due to bandages.  +----+---------------+---------+-----------+----------+--------------+ LEFTCompressibilityPhasicitySpontaneityPropertiesThrombus Aging +----+---------------+---------+-----------+----------+--------------+ CFV Full           Yes      Yes                                 +----+---------------+---------+-----------+----------+--------------+     Summary: RIGHT: - There is no evidence of deep vein thrombosis in the lower extremity.  - No cystic structure found in the popliteal fossa.  LEFT: - No evidence of common femoral vein obstruction.   *See table(s) above for measurements and observations.    Preliminary    [place] EKG SITE RITE  Result Date: 10/06/2023 If Site Rite image not attached, placement could not be confirmed due to current cardiac rhythm.    Medications:    Scheduled Medications:  allopurinol  200 mg Oral Daily   amiodarone  200 mg Oral Daily   apixaban  5 mg Oral BID   Chlorhexidine Gluconate Cloth  6 each Topical Daily   empagliflozin  10 mg Oral Daily   levothyroxine  50 mcg Oral Q0600   mexiletine  150 mg Oral Q12H   pantoprazole  40 mg Oral BID   sodium chloride flush  10-40 mL Intracatheter Q12H    Infusions:  ceFEPime (MAXIPIME) IV 2 g (10/07/23 0530)   furosemide (LASIX) 200 mg in dextrose 5 % 100 mL (2 mg/mL) infusion 20 mg/hr (10/07/23 0859)    PRN  Medications: acetaminophen, baclofen, ondansetron (ZOFRAN) IV, oxyCODONE-acetaminophen, phenol, sodium chloride, sodium chloride flush  Assessment/Plan:   1. 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 Ratio 1.26 read as equivocal.  SPEP negative - cMRI repeated (11/22): LVEF 43%, RVEF 44%, LGE concerning for sarcoid.  - Echo (4/23): EF 35-40% Moderate RV dysfunction - Has seen geneticist, Dr. Jomarie Longs. Work up c/w LMNA.  - Admitted with NYHA IIIb symptoms and 40 pound weight gain - Echo 11/19  EF 35-40%, LV with GHK, RV mod reduced, mildly elevated PASP, RVSP ~39, trivial MR  - Massively volume overloaded with R>>L HF  - On lasix at 20 but only modest response. Co-ox78% - Increase lasix to 30. Add metolazone 5 - If no response can add milrinone +/- RHC - Continue Jardiance 10 - Off Spiro given rising sCr - Has been off b-blocker due to bradycardia and heart block - Off ARNi and ARB (hypotension and N/V in the past) - can rechallenge as able   2. Syncope/VT - 04/23 > secondary to VT - s/p ICD 4/23. - Continue mexiletine 150 mg bid   - Continue amiodarone 200 mg daiy - Followed by Dr. Ladona Ridgel  3. AKI on CKD Stage IIIb - Due to cardiorenal syndrome - Baseline SCr 1.7-2.0 - Admit Scr 2.46 -> 2.09>2.3>2.0>1.93>2.39>2.63>2.96> 3.03> 2.88 - Diuresis as above.  - If no response add milrinone +/- RHC   4. Chronic AF/AFL - S/p AFL ablation 5/19 with Dr. Ladona Ridgel. - Continue Eliquis 5 mg bid. No bleeding issues. - Continue amiodarone 200 daily - No role fo DC-CV as he has been in AFL for a long time   5.  OSA: Continue CPAP   6. PVCs/VT - Continue mexiletine + amiodarone. - Followed by EP   7. Obesity - Body mass index is Body mass index is 42.94 kg/m. - Consider GLP1RA  8. Hypokalemia - K 3.3, replete - BMET pending  9. ID - per primary. WBC peaked at 21.6. today at 11.7 - UA (-), PCXR (-),  RVP (-) - Blood cultures - NGTD - Broad spectrum abx    10. Gout - R>L knee/leg swelling  - Has received prednisone  Length of Stay: 7  Jaceion Aday BensimhonMD 10/07/2023, 9:24 AM  Advanced Heart Failure Team Pager [phone] (M-F; 7a - 4p)  Please contact [facility] for night-coverage after hours (4p -7a ) and weekends on [email or web address]

## 2023-10-07 NOTE — Progress Notes (Signed)
   10/07/23 2216  BiPAP/CPAP/SIPAP  Reason BIPAP/CPAP not in use Non-compliant (pt refused cpap)

## 2023-10-07 NOTE — Progress Notes (Signed)
PROGRESS NOTE    Bradley Powell  RUE:[id number] DOB: 04-22-1981 DOA: 09/29/2023 PCP: [name], MD   Brief Narrative:  This 42 year old male with past medical history of paroxysmal atrial flutter s/p remote ablation, chronic combined systolic/diastolic CHF, NICM, OSA and morbid obesity.  He presents to the ER with complaints of increasing fluid retention on his legs up to his stomach.  Per patient his been compliant with his medications.  On lasix gtt directed by CHF team.  Cardiology is following. Plan is  to have DCCV when adequately diuresed.   Assessment & Plan:   Principal Problem:   Acute on chronic congestive heart failure (HCC) Active Problems:   Hepatic cirrhosis (HCC)   Edema   Morbid obesity (HCC)   Gout   NICM (nonischemic cardiomyopathy) (HCC)   Paroxysmal atrial fibrillation (HCC)   Acute on chronic systolic (congestive) heart failure (HCC)   ICD (implantable cardioverter-defibrillator) in place   Fluid overload  Acute on chronic systolic heart failure: Echo >  LVEF 35-40%.  Patient was started on Lasix gtt. as per CHF protocol. Medications adjusted by CHF team. Strict I's and O's, daily weights Cardiology consult appreciated. Down 27L overall and 16lbs. Patient wants to have a break from Lasix.  Stopped as per cardiology. Lasix gtt switched to Lasix 160 mg twice daily plus Diamox. Appears severely volume overloaded,  urine output dropped with intermittent dosing of Lasix. Serum creatinine up to 2.9 with 5 pound weight gain overnight. Lasix infusion resumed.  May require milrinone infusion if urine output not improved. PICC line inserted.   Intake/Output Summary (Last 24 hours) at 10/07/2023 1130 Last data filed at 10/07/2023 0900 Gross per 24 hour  Intake 1329.93 ml  Output 1900 ml  Net -570.07 ml    AKI on CKD stage 4: Serum creatinine remained around baseline. Creatinine 2.46 -> 2.09>2.3>2.0>1.93>2.39>2.63>2.96    Permanent atrial  fibrillation Continue Amiodarone/ Eliquis. TEE/DCCV is delayed due to patient had fever 10/05/23. DCCV would be pursued once patient is adequately diuresed.   Hepatic cirrhosis (HCC) Likely secondary to NASH.   Morbid obesity (HCC)// OSA Estimated body mass index is 42.25 kg/m as calculated from the following:   Height as of this encounter: 5\' 8"  (1.727 m).   Weight as of this encounter: 126.1 kg.    Hypokalemia Replace aggressively. -Mg normal  Fever: Patient spiked fever 11/21, pancultures were sent. Chest x-ray, UA unremarkable. Empirically started on vancomycin, cefepime and Flagyl. Blood cultures no growth, urine cultures contaminated. Vancomycin discontinued.  DVT prophylaxis: Eliquis Code Status: Full code Family Communication: No family at bedside Disposition Plan:   Admitted for acute on chronic systolic CHF.  Cardiology is consulted,  requiring IV diuresis. Also found to have A-fib, requiring DCCV once adequately diuresed.  Consultants:  Cardiology  Procedures:  Echocardiogram.  Antimicrobials: Anti-infectives (From admission, onward)    Start     Dose/Rate Route Frequency Ordered Stop   10/06/23 0600  vancomycin (VANCOCIN) IVPB 1000 mg/200 mL premix  Status:  Discontinued        1,000 mg 200 mL/hr over 60 Minutes Intravenous Every 24 hours 10/05/23 0412 10/06/23 1053   10/05/23 1600  ceFEPIme (MAXIPIME) 2 g in sodium chloride 0.9 % 100 mL IVPB        2 g 200 mL/hr over 30 Minutes Intravenous Every 12 hours 10/05/23 0412     10/05/23 0515  vancomycin (VANCOCIN) IVPB 1000 mg/200 mL premix        1,000 mg 200  mL/hr over 60 Minutes Intravenous Every 1 hr x 2 10/05/23 0415 10/05/23 0927   10/05/23 0500  metroNIDAZOLE (FLAGYL) IVPB 500 mg  Status:  Discontinued        500 mg 100 mL/hr over 60 Minutes Intravenous Every 12 hours 10/05/23 0402 10/06/23 1053   10/05/23 0500  vancomycin (VANCOCIN) IVPB 1000 mg/200 mL premix  Status:  Discontinued        1,000  mg 200 mL/hr over 60 Minutes Intravenous Every 1 hr x 2 10/05/23 0412 10/05/23 0414   10/05/23 0500  ceFEPIme (MAXIPIME) 2 g in sodium chloride 0.9 % 100 mL IVPB        2 g 200 mL/hr over 30 Minutes Intravenous  Once 10/05/23 0412 10/05/23 0926   10/05/23 0500  vancomycin (VANCOREADY) IVPB 2000 mg/400 mL  Status:  Discontinued        2,000 mg 200 mL/hr over 120 Minutes Intravenous  Once 10/05/23 0414 10/05/23 0415       Subjective: Patient was seen and examined at bedside.  Overnight events noted.   Patient is morbidly obese, severely deconditioned, remains on supplemental oxygen. Urine output decreased on intermittent dosing of Lasix, Patient has gained 5 pounds overnight. Patient resumed on Lasix infusion.  Objective: Vitals:   10/07/23 0019 10/07/23 0512 10/07/23 0827 10/07/23 0836  BP: 120/87 125/83 (!) 105/91   Pulse:      Resp: 18 17 18    Temp: 98.1 F (36.7 C) 98.3 F (36.8 C) (!) 97.4 F (36.3 C)   TempSrc: Oral Oral Oral   SpO2: 99% 99% 99% 98%  Weight:  128.1 kg    Height:        Intake/Output Summary (Last 24 hours) at 10/07/2023 1130 Last data filed at 10/07/2023 0900 Gross per 24 hour  Intake 1329.93 ml  Output 1900 ml  Net -570.07 ml   Filed Weights   10/05/23 0500 10/06/23 0658 10/07/23 0512  Weight: 123.5 kg 125.7 kg 128.1 kg    Examination:  General exam: Appears comfortable, deconditioned, not in any acute distress. Respiratory system: CTA bilaterally.  Respiratory effort normal.  RR 15 Cardiovascular system: S1 & S2 heard, irregular rhythm, no murmur . Gastrointestinal system: Abdomen is nondistended, soft and non tender. Normal bowel sounds heard. Central nervous system: Alert and oriented x3. No focal neurological deficits. Extremities: Edema++, no cyanosis, no clubbing Skin: No rashes, lesions or ulcers Psychiatry: Judgement and insight appear normal. Mood & affect appropriate.     Data Reviewed: I have personally reviewed following labs  and imaging studies  CBC: Recent Labs  Lab 10/02/23 0454 10/05/23 0002 10/05/23 0311 10/06/23 0314 10/07/23 0849  WBC 8.3 21.6* 17.9* 15.7* 11.7*  HGB 15.2 12.7* 13.1 13.0 12.3*  HCT 48.5 38.6* 41.1 42.2 38.9*  MCV 84.9 81.8 83.0 84.9 83.5  PLT 298 214 206 179 204   Basic Metabolic Panel: Recent Labs  Lab 10/02/23 0454 10/02/23 1454 10/04/23 0400 10/04/23 2102 10/05/23 0311 10/05/23 1934 10/06/23 0314 10/06/23 2049 10/07/23 0849  NA 138   < > 138   < > 135 133* 131* 131* 133*  K 2.9*   < > 4.9   < > 2.9* 3.4* 3.5 3.6 3.3*  CL 97*   < > 93*   < > 91* 92* 91* 94* 97*  CO2 29   < > 26   < > 30 28 29 25 24   GLUCOSE 102*   < > 84   < > 106* 151* 151*  183* 168*  BUN 33*   < > 39*   < > 37* 41* 44* 49* 49*  CREATININE 2.30*   < > 2.39*   < > 2.47* 2.63* 2.96* 3.03* 2.88*  CALCIUM 8.4*   < > 9.2   < > 8.5* 8.3* 8.3* 8.4* 8.6*  MG 2.0  --  2.0  --  1.9  --  2.2  --  2.2  PHOS  --   --   --   --  3.2  --   --   --  3.4   < > = values in this interval not displayed.   GFR: Estimated Creatinine Clearance: 44.5 mL/min (A) (by C-G formula based on SCr of 2.88 mg/dL (H)). Liver Function Tests: Recent Labs  Lab 10/04/23 0400  AST 60*  ALT 43  ALKPHOS 202*  BILITOT 2.8*  PROT 6.6  ALBUMIN 2.5*   No results for input(s): "LIPASE", "AMYLASE" in the last 168 hours. No results for input(s): "AMMONIA" in the last 168 hours. Coagulation Profile: No results for input(s): "INR", "PROTIME" in the last 168 hours. Cardiac Enzymes: No results for input(s): "CKTOTAL", "CKMB", "CKMBINDEX", "TROPONINI" in the last 168 hours. BNP (last 3 results) No results for input(s): "PROBNP" in the last 8760 hours. HbA1C: No results for input(s): "HGBA1C" in the last 72 hours. CBG: No results for input(s): "GLUCAP" in the last 168 hours. Lipid Profile: No results for input(s): "CHOL", "HDL", "LDLCALC", "TRIG", "CHOLHDL", "LDLDIRECT" in the last 72 hours. Thyroid Function Tests: No results for  input(s): "TSH", "T4TOTAL", "FREET4", "T3FREE", "THYROIDAB" in the last 72 hours. Anemia Panel: No results for input(s): "VITAMINB12", "FOLATE", "FERRITIN", "TIBC", "IRON", "RETICCTPCT" in the last 72 hours. Sepsis Labs: Recent Labs  Lab 10/05/23 0002  LATICACIDVEN 1.8    Recent Results (from the past 240 hour(s))  Culture, blood (Routine X 2) w Reflex to ID Panel     Status: None (Preliminary result)   Collection Time: 10/05/23 12:02 AM   Specimen: BLOOD RIGHT ARM  Result Value Ref Range Status   Specimen Description BLOOD RIGHT ARM  Final   Special Requests   Final    BOTTLES DRAWN AEROBIC AND ANAEROBIC Blood Culture results may not be optimal due to an excessive volume of blood received in culture bottles   Culture   Final    NO GROWTH 2 DAYS Performed at [facility], [address] [place], [place] [address]    Report Status PENDING  Incomplete  Culture, blood (Routine X 2) w Reflex to ID Panel     Status: None (Preliminary result)   Collection Time: 10/05/23 12:05 AM   Specimen: BLOOD LEFT ARM  Result Value Ref Range Status   Specimen Description BLOOD LEFT ARM  Final   Special Requests   Final    BOTTLES DRAWN AEROBIC AND ANAEROBIC Blood Culture results may not be optimal due to an excessive volume of blood received in culture bottles   Culture   Final    NO GROWTH 2 DAYS Performed at [facility], [address] [place], [place] [address]    Report Status PENDING  Incomplete  Urine Culture (for pregnant, neutropenic or urologic patients or patients with an indwelling urinary catheter)     Status: Abnormal   Collection Time: 10/05/23  5:42 AM   Specimen: Urine, Clean Catch  Result Value Ref Range Status   Specimen Description URINE, CLEAN CATCH  Final   Special Requests NONE  Final  Culture (A)  Final    <10,000 COLONIES/mL INSIGNIFICANT GROWTH Performed at [facility], [address], [place], [place] [address]    Report Status 10/06/2023  FINAL  Final  Respiratory (~20 pathogens) panel by PCR     Status: None   Collection Time: 10/05/23 12:02 PM   Specimen: Nasopharyngeal Swab; Respiratory  Result Value Ref Range Status   Adenovirus NOT DETECTED NOT DETECTED Final   Coronavirus 229E NOT DETECTED NOT DETECTED Final    Comment: (NOTE) The Coronavirus on the Respiratory Panel, DOES NOT test for the novel  Coronavirus (2019 nCoV)    Coronavirus HKU1 NOT DETECTED NOT DETECTED Final   Coronavirus NL63 NOT DETECTED NOT DETECTED Final   Coronavirus OC43 NOT DETECTED NOT DETECTED Final   Metapneumovirus NOT DETECTED NOT DETECTED Final   Rhinovirus / Enterovirus NOT DETECTED NOT DETECTED Final   Influenza A NOT DETECTED NOT DETECTED Final   Influenza B NOT DETECTED NOT DETECTED Final   Parainfluenza Virus 1 NOT DETECTED NOT DETECTED Final   Parainfluenza Virus 2 NOT DETECTED NOT DETECTED Final   Parainfluenza Virus 3 NOT DETECTED NOT DETECTED Final   Parainfluenza Virus 4 NOT DETECTED NOT DETECTED Final   Respiratory Syncytial Virus NOT DETECTED NOT DETECTED Final   Bordetella pertussis NOT DETECTED NOT DETECTED Final   Bordetella Parapertussis NOT DETECTED NOT DETECTED Final   Chlamydophila pneumoniae NOT DETECTED NOT DETECTED Final   Mycoplasma pneumoniae NOT DETECTED NOT DETECTED Final    Comment: Performed at [facility], [address] [place], [place] [address]     Radiology Studies: DG CHEST PORT 1 VIEW  Result Date: 10/06/2023 CLINICAL DATA:  Central line placement EXAM: PORTABLE CHEST 1 VIEW COMPARISON:  Chest x-ray 10/04/2023 FINDINGS: Left-sided pacemaker is present. The heart is enlarged. Right upper extremity PICC terminates in the SVC. There is no pneumothorax. The lungs are clear. There is no pleural effusion. No acute osseous abnormalities. IMPRESSION: 1. Right upper extremity PICC terminates in the SVC. No pneumothorax. 2. Cardiomegaly. Electronically Signed   By: Darliss Cheney M.D.   On: 10/06/2023  19:23   VAS [place] LOWER EXTREMITY VENOUS (DVT)  Result Date: 10/06/2023  Lower Venous DVT Study Patient Name:  [facility]  Date of Exam:   10/06/2023 Medical Rec #: [id number]           Accession #:    [id number] Date of Birth: [birth year]          Patient Gender: M Patient Age:   55 years Exam Location:  [facility] Procedure:      VAS [place] LOWER EXTREMITY VENOUS (DVT) Referring Phys: [place] LEE --------------------------------------------------------------------------------  Indications: Swelling, and Edema. Other Indications: Atrial flutter. Limitations: Bandages. Comparison Study: No prior exam. Performing Technologist: Fernande Bras  Examination Guidelines: A complete evaluation includes B-mode imaging, spectral Doppler, color Doppler, and power Doppler as needed of all accessible portions of each vessel. Bilateral testing is considered an integral part of a complete examination. Limited examinations for reoccurring indications may be performed as noted. The reflux portion of the exam is performed with the patient in reverse Trendelenburg.  +---------+---------------+---------+-----------+----------+-------------------+ RIGHT    CompressibilityPhasicitySpontaneityPropertiesThrombus Aging      +---------+---------------+---------+-----------+----------+-------------------+ CFV      Full           Yes      Yes                                      +---------+---------------+---------+-----------+----------+-------------------+  SFJ      Full                                                             +---------+---------------+---------+-----------+----------+-------------------+ FV Prox  Full                                                             +---------+---------------+---------+-----------+----------+-------------------+ FV Mid   Full                                                              +---------+---------------+---------+-----------+----------+-------------------+ FV DistalFull                                                             +---------+---------------+---------+-----------+----------+-------------------+ PFV      Full                                                             +---------+---------------+---------+-----------+----------+-------------------+ POP      Full           Yes      Yes                                      +---------+---------------+---------+-----------+----------+-------------------+ PTV                                                   Not well visualized +---------+---------------+---------+-----------+----------+-------------------+ PERO                                                  Not well visualized +---------+---------------+---------+-----------+----------+-------------------+ PTVs and Peros not visualized due to bandages.  +----+---------------+---------+-----------+----------+--------------+ LEFTCompressibilityPhasicitySpontaneityPropertiesThrombus Aging +----+---------------+---------+-----------+----------+--------------+ CFV Full           Yes      Yes                                 +----+---------------+---------+-----------+----------+--------------+     Summary: RIGHT: - There is no evidence of deep vein thrombosis in the lower extremity.  - No cystic structure found in the popliteal fossa.  LEFT: - No  evidence of common femoral vein obstruction.   *See table(s) above for measurements and observations.    Preliminary    [place] EKG SITE RITE  Result Date: 10/06/2023 If Site Rite image not attached, placement could not be confirmed due to current cardiac rhythm.   Scheduled Meds:  allopurinol  200 mg Oral Daily   amiodarone  200 mg Oral Daily   apixaban  5 mg Oral BID   Chlorhexidine Gluconate Cloth  6 each Topical Daily   empagliflozin  10 mg Oral Daily   levothyroxine  50 mcg Oral  Q0600   mexiletine  150 mg Oral Q12H   pantoprazole  40 mg Oral BID   sodium chloride flush  10-40 mL Intracatheter Q12H   Continuous Infusions:  ceFEPime (MAXIPIME) IV 2 g (10/07/23 0530)   furosemide (LASIX) 200 mg in dextrose 5 % 100 mL (2 mg/mL) infusion 20 mg/hr (10/07/23 0859)     LOS: 7 days    Time spent: 35 mins    Willeen Niece, MD [facility]   If 7PM-7AM, please contact night-coverage

## 2023-10-08 DIAGNOSIS — I5023 Acute on chronic systolic (congestive) heart failure: Secondary | ICD-10-CM | POA: Diagnosis not present

## 2023-10-08 LAB — BASIC METABOLIC PANEL
Anion gap: 14 (ref 5–15)
Anion gap: 15 (ref 5–15)
BUN: 52 mg/dL — ABNORMAL HIGH (ref 6–20)
BUN: 51 mg/dL — ABNORMAL HIGH (ref 6–20)
CO2: 27 mmol/L (ref 22–32)
CO2: 27 mmol/L (ref 22–32)
Calcium: 8.8 mg/dL — ABNORMAL LOW (ref 8.9–10.3)
Calcium: 8.9 mg/dL (ref 8.9–10.3)
Chloride: 91 mmol/L — ABNORMAL LOW (ref 98–111)
Chloride: 89 mmol/L — ABNORMAL LOW (ref 98–111)
Creatinine, Ser: 2.92 mg/dL — ABNORMAL HIGH (ref 0.61–1.24)
Creatinine, Ser: 2.89 mg/dL — ABNORMAL HIGH (ref 0.61–1.24)
GFR, Estimated: 27 mL/min — ABNORMAL LOW (ref >60)
GFR, Estimated: 27 mL/min — ABNORMAL LOW (ref >60)
Glucose, Bld: 93 mg/dL (ref 70–99)
Glucose, Bld: 92 mg/dL (ref 70–99)
Potassium: 2.6 mmol/L — CL (ref 3.5–5.1)
Potassium: 2.9 mmol/L — ABNORMAL LOW (ref 3.5–5.1)
Sodium: 132 mmol/L — ABNORMAL LOW (ref 135–145)
Sodium: 131 mmol/L — ABNORMAL LOW (ref 135–145)

## 2023-10-08 LAB — MAGNESIUM: Magnesium: 2.4 mg/dL (ref 1.7–2.4)

## 2023-10-08 LAB — CBC
HCT: 40.9 % (ref 39.0–52.0)
Hemoglobin: 13.1 g/dL (ref 13.0–17.0)
MCH: 26.2 pg (ref 26.0–34.0)
MCHC: 32 g/dL (ref 30.0–36.0)
MCV: 81.8 fL (ref 80.0–100.0)
Platelets: 213 10*3/uL (ref 150–400)
RBC: 5 MIL/uL (ref 4.22–5.81)
RDW: 15.7 % — ABNORMAL HIGH (ref 11.5–15.5)
WBC: 9.6 10*3/uL (ref 4.0–10.5)
nRBC: 0.2 % (ref 0.0–0.2)

## 2023-10-08 LAB — COOXEMETRY PANEL
Carboxyhemoglobin: 1.6 % — ABNORMAL HIGH (ref 0.5–1.5)
Methemoglobin: <0.7 % (ref 0.0–1.5)
O2 Saturation: 72.5 %
Total hemoglobin: 13.6 g/dL (ref 12.0–16.0)

## 2023-10-08 MED ORDER — POTASSIUM CHLORIDE CRYS ER 20 MEQ PO TBCR
40.0000 meq | EXTENDED_RELEASE_TABLET | ORAL | Status: AC
  Administered 2023-10-08 (×3): 40 meq via ORAL
  Filled 2023-10-08 (×3): qty 2

## 2023-10-08 MED ORDER — SPIRONOLACTONE 12.5 MG HALF TABLET
12.5000 mg | ORAL_TABLET | Freq: Every day | ORAL | Status: DC
  Administered 2023-10-08 – 2023-10-10 (×3): 12.5 mg via ORAL
  Filled 2023-10-08 (×4): qty 1

## 2023-10-08 MED ORDER — POTASSIUM CHLORIDE CRYS ER 20 MEQ PO TBCR
60.0000 meq | EXTENDED_RELEASE_TABLET | Freq: Once | ORAL | Status: AC
  Administered 2023-10-08: 60 meq via ORAL
  Filled 2023-10-08: qty 3

## 2023-10-08 MED ORDER — POTASSIUM CHLORIDE CRYS ER 20 MEQ PO TBCR
40.0000 meq | EXTENDED_RELEASE_TABLET | Freq: Once | ORAL | Status: AC
  Administered 2023-10-08: 40 meq via ORAL
  Filled 2023-10-08: qty 2

## 2023-10-08 MED ORDER — POTASSIUM CHLORIDE CRYS ER 20 MEQ PO TBCR: 40.0000 meq | EXTENDED_RELEASE_TABLET | ORAL | Status: DC

## 2023-10-08 NOTE — Progress Notes (Signed)
Advanced Heart Failure Rounding Note   Subjective:    Lasix gtt increased to 30/hr las night and added metolazone.   Out 8.3L overnight,   Feels better. Less bloated. Denies SOB.   Co-ox 73% CVP 15  Scr 3.03-> 2.92  Objective:   Echo 11/19 EF 35-40%, LV with GHK, RV mod reduced, mildly elevated PASP, RVSP ~39, trivial MR   Weight Range:  Vital Signs:   Temp:  [97.4 F (36.3 C)-98.7 F (37.1 C)] 97.5 F (36.4 C) (11/24 0812) Pulse Rate:  [78] 78 (11/24 0812) Resp:  [16-18] 18 (11/24 0812) BP: (102-117)/(65-90) 113/76 (11/24 0812) SpO2:  [98 %-100 %] 98 % (11/24 0812) Weight:  [124.1 kg] 124.1 kg (11/24 0500) Last BM Date : 10/06/23  Weight change: Filed Weights   10/06/23 0658 10/07/23 0512 10/08/23 0500  Weight: 125.7 kg 128.1 kg 124.1 kg    Intake/Output:   Intake/Output Summary (Last 24 hours) at 10/08/2023 0946 Last data filed at 10/08/2023 0856 Gross per 24 hour  Intake 943 ml  Output 8200 ml  Net -7257 ml     Physical Exam: General: Obese male sitting up on side of bed HEENT: normal Neck: supple.JVP to ear. appreciated. Cor: Irregular rate & rhythm. No rubs, gallops or murmurs. Lungs: clear Abdomen: obese soft, nontender, nondistended. No hepatosplenomegaly. No bruits or masses. Good bowel sounds. Extremities: no cyanosis, clubbing, rash, 2-3+ edema Neuro: alert & orientedx3, cranial nerves grossly intact. moves all 4 extremities w/o difficulty. Affect pleasant   Telemetry: A Flutter 70-80s (personally reviewed)    Labs: Basic Metabolic Panel: Recent Labs  Lab 10/04/23 0400 10/04/23 2102 10/05/23 0311 10/05/23 1934 10/06/23 0314 10/06/23 2049 10/07/23 0849 10/08/23 0525  NA 138   < > 135 133* 131* 131* 133* 132*  K 4.9   < > 2.9* 3.4* 3.5 3.6 3.3* 2.6*  CL 93*   < > 91* 92* 91* 94* 97* 91*  CO2 26   < > 30 28 29 25 24 27   GLUCOSE 84   < > 106* 151* 151* 183* 168* 93  BUN 39*   < > 37* 41* 44* 49* 49* 52*  CREATININE 2.39*   < >  2.47* 2.63* 2.96* 3.03* 2.88* 2.92*  CALCIUM 9.2   < > 8.5* 8.3* 8.3* 8.4* 8.6* 8.8*  MG 2.0  --  1.9  --  2.2  --  2.2 2.4  PHOS  --   --  3.2  --   --   --  3.4  --    < > = values in this interval not displayed.    Liver Function Tests: Recent Labs  Lab 10/04/23 0400  AST 60*  ALT 43  ALKPHOS 202*  BILITOT 2.8*  PROT 6.6  ALBUMIN 2.5*   No results for input(s): "LIPASE", "AMYLASE" in the last 168 hours. No results for input(s): "AMMONIA" in the last 168 hours.  CBC: Recent Labs  Lab 10/05/23 0002 10/05/23 0311 10/06/23 0314 10/07/23 0849 10/08/23 0525  WBC 21.6* 17.9* 15.7* 11.7* 9.6  HGB 12.7* 13.1 13.0 12.3* 13.1  HCT 38.6* 41.1 42.2 38.9* 40.9  MCV 81.8 83.0 84.9 83.5 81.8  PLT 214 206 179 204 213    Cardiac Enzymes: No results for input(s): "CKTOTAL", "CKMB", "CKMBINDEX", "TROPONINI" in the last 168 hours.  BNP: BNP (last 3 results) Recent Labs    04/18/23 1610 05/23/23 1618 09/30/23 0513  BNP 171.7* 209.9* 222.1*    ProBNP (last 3 results) No  results for input(s): "PROBNP" in the last 8760 hours.  Other results:  Imaging: VAS [place] LOWER EXTREMITY VENOUS (DVT)  Result Date: 10/07/2023  Lower Venous DVT Study Patient Name:  TREYLIN VOET [facility]  Date of Exam:   10/06/2023 Medical Rec #: [id number]           Accession #:    [id number] Date of Birth: 1981-06-06          Patient Gender: M Patient Age:   42 years Exam Location:  [facility] Procedure:      VAS [place] LOWER EXTREMITY VENOUS (DVT) Referring Phys: [place] LEE --------------------------------------------------------------------------------  Indications: Swelling, and Edema. Other Indications: Atrial flutter. Limitations: Bandages. Comparison Study: No prior exam. Performing Technologist: Fernande Bras  Examination Guidelines: A complete evaluation includes B-mode imaging, spectral Doppler, color Doppler, and power Doppler as needed of all accessible portions of each vessel. Bilateral  testing is considered an integral part of a complete examination. Limited examinations for reoccurring indications may be performed as noted. The reflux portion of the exam is performed with the patient in reverse Trendelenburg.  +---------+---------------+---------+-----------+----------+-------------------+ RIGHT    CompressibilityPhasicitySpontaneityPropertiesThrombus Aging      +---------+---------------+---------+-----------+----------+-------------------+ CFV      Full           Yes      Yes                                      +---------+---------------+---------+-----------+----------+-------------------+ SFJ      Full                                                             +---------+---------------+---------+-----------+----------+-------------------+ FV Prox  Full                                                             +---------+---------------+---------+-----------+----------+-------------------+ FV Mid   Full                                                             +---------+---------------+---------+-----------+----------+-------------------+ FV DistalFull                                                             +---------+---------------+---------+-----------+----------+-------------------+ PFV      Full                                                             +---------+---------------+---------+-----------+----------+-------------------+ POP  Full           Yes      Yes                                      +---------+---------------+---------+-----------+----------+-------------------+ PTV                                                   Not well visualized +---------+---------------+---------+-----------+----------+-------------------+ PERO                                                  Not well visualized +---------+---------------+---------+-----------+----------+-------------------+ PTVs and Peros not  visualized due to bandages.  +----+---------------+---------+-----------+----------+--------------+ LEFTCompressibilityPhasicitySpontaneityPropertiesThrombus Aging +----+---------------+---------+-----------+----------+--------------+ CFV Full           Yes      Yes                                 +----+---------------+---------+-----------+----------+--------------+     Summary: RIGHT: - There is no evidence of deep vein thrombosis in the lower extremity.  - No cystic structure found in the popliteal fossa.  LEFT: - No evidence of common femoral vein obstruction.   *See table(s) above for measurements and observations. Electronically signed by Gerarda Fraction on 10/07/2023 at 11:40:08 AM.    Final    DG CHEST PORT 1 VIEW  Result Date: 10/06/2023 CLINICAL DATA:  Central line placement EXAM: PORTABLE CHEST 1 VIEW COMPARISON:  Chest x-ray 10/04/2023 FINDINGS: Left-sided pacemaker is present. The heart is enlarged. Right upper extremity PICC terminates in the SVC. There is no pneumothorax. The lungs are clear. There is no pleural effusion. No acute osseous abnormalities. IMPRESSION: 1. Right upper extremity PICC terminates in the SVC. No pneumothorax. 2. Cardiomegaly. Electronically Signed   By: Darliss Cheney M.D.   On: 10/06/2023 19:23   [place] EKG SITE RITE  Result Date: 10/06/2023 If Site Rite image not attached, placement could not be confirmed due to current cardiac rhythm.    Medications:    Scheduled Medications:  allopurinol  200 mg Oral Daily   amiodarone  200 mg Oral Daily   apixaban  5 mg Oral BID   Chlorhexidine Gluconate Cloth  6 each Topical Daily   empagliflozin  10 mg Oral Daily   levothyroxine  50 mcg Oral Q0600   mexiletine  150 mg Oral Q12H   pantoprazole  40 mg Oral BID   potassium chloride  40 mEq Oral Q4H   potassium chloride  40 mEq Oral Once   sodium chloride flush  10-40 mL Intracatheter Q12H    Infusions:  ceFEPime (MAXIPIME) IV 2 g (10/08/23 0541)    furosemide (LASIX) 200 mg in dextrose 5 % 100 mL (2 mg/mL) infusion 30 mg/hr (10/08/23 0821)    PRN Medications: acetaminophen, baclofen, ondansetron (ZOFRAN) IV, oxyCODONE-acetaminophen, phenol, sodium chloride, sodium chloride flush  Assessment/Plan:   1. 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 Ratio 1.26 read as equivocal.  SPEP negative - cMRI repeated (  11/22): LVEF 43%, RVEF 44%, LGE concerning for sarcoid.  - Echo (4/23): EF 35-40% Moderate RV dysfunction - Has seen geneticist, Dr. Jomarie Longs. Work up c/w LMNA.  - Admitted with NYHA IIIb symptoms and 40 pound weight gain - Echo 11/19 EF 35-40%, LV with GHK, RV mod reduced, mildly elevated PASP, RVSP ~39, trivial MR  - Massively volume overloaded with R>>L HF  - Urine output much improved with lasix 30/hr. K is low so will hold metolazone today -  Will add back low-dose spiro to help spare K - Continue Jardiance 10 - Has been off b-blocker due to bradycardia and heart block - Off ARNi and ARB (hypotension and N/V in the past) - can rechallenge as able   2. Syncope/VT - 04/23 > secondary to VT - s/p ICD 4/23. - Continue mexiletine 150 mg bid   - Continue amiodarone 200 mg daiy - Followed by Dr. Ladona Ridgel  3. AKI on CKD Stage IIIb - Due to cardiorenal syndrome - Baseline SCr 1.7-2.0 - Admit Scr 2.46 -> 2.09>2.3>2.0>1.93>2.39>2.63>2.96> 3.03> 2.92 - Diuresis as above.    4. Chronic AF/AFL - S/p AFL ablation 5/19 with Dr. Ladona Ridgel. - Continue Eliquis 5 mg bid. No bleeding issues. - Continue amiodarone 200 daily for VT - No role for DC-CV as he has been in AFL for a long time   5.  OSA: Continue CPAP   6. PVCs/VT - Continue mexiletine + amiodarone. - Followed by EP   7. Obesity - Body mass index is Body mass index is 41.6 kg/m. - Consider GLP1RA  8. Hypokalemia - K 2.6, replete -  Add low-dose spiro - No metolazone today  9. ID -  per primary. WBC peaked at 21.6. today at 9.6 - UA (-), PCXR (-), RVP (-) - Blood cultures - NGTD. Afebrile - Broad spectrum abx    10. Gout - R>L knee/leg swelling  - Has received prednisone  Length of Stay: 8  Serene Kopf BensimhonMD 10/08/2023, 9:46 AM  Advanced Heart Failure Team Pager [phone] (M-F; 7a - 4p)  Please contact [facility] for night-coverage after hours (4p -7a ) and weekends on [email or web address]

## 2023-10-08 NOTE — Progress Notes (Signed)
Pharmacy Antibiotic Note  Bradley Powell is a 42 y.o. male admitted on 09/29/2023 with fever and leukocytosis with unclear source.  Pharmacy has been consulted for vancomycin and cefepime dosing. Abx de-escalated to Cefepime. Currently on day 4 of antibiotics.  WBC wnl. Scr up 2.92. Afebrile  Plan: Continue Cefepime 2g IV Q12H F/U LOT and possible de-escalation  Monitor renal function and adjust as necessary  Monitor fever curve, and clinical progress  Height: 5\' 8"  (172.7 cm) Weight: 124.1 kg (273 lb 9.5 oz) IBW/kg (Calculated) : 68.4  Temp (24hrs), Avg:98 F (36.7 C), Min:97.4 F (36.3 C), Max:98.7 F (37.1 C)  Recent Labs  Lab 10/05/23 0002 10/05/23 0311 10/05/23 1934 10/06/23 0314 10/06/23 2049 10/07/23 0849 10/08/23 0525  WBC 21.6* 17.9*  --  15.7*  --  11.7* 9.6  CREATININE  --  2.47* 2.63* 2.96* 3.03* 2.88* 2.92*  LATICACIDVEN 1.8  --   --   --   --   --   --     Estimated Creatinine Clearance: 43.1 mL/min (A) (by C-G formula based on SCr of 2.92 mg/dL (H)).    Allergies  Allergen Reactions   Entresto [Sacubitril-Valsartan] Nausea And Vomiting and Other (See Comments)    Lightheaded, fainting    Antimicrobials this admission: Cefepime 11/21>> Metronidazole 11/21 > 11/22 Vanc 11/21 >11/22  Dose adjustments this admission: None  Microbiology results: Bcx 11/21: ngtd x2 Ucx 11/21: <10k insign growth Resp PCR 11/21: neg   Thank you for allowing pharmacy to be a part of this patient's care.  Zenobia Kuennen 10/08/2023 7:17 AM

## 2023-10-08 NOTE — Progress Notes (Signed)
PROGRESS NOTE    ALBARA SCORZA  NWG:[id number] DOB: 1980-12-12 DOA: 09/29/2023 PCP: [name], MD   Brief Narrative:  This 42 year old male with past medical history of paroxysmal atrial flutter s/p remote ablation, chronic combined systolic/diastolic CHF, NICM, OSA and morbid obesity.  He presents to the ER with complaints of increasing fluid retention on his legs up to his stomach.  Per patient his been compliant with his medications.  On lasix gtt directed by CHF team.  Cardiology is following. Plan is  to have DCCV when adequately diuresed.   Assessment & Plan:   Principal Problem:   Acute on chronic congestive heart failure (HCC) Active Problems:   Hepatic cirrhosis (HCC)   Edema   Morbid obesity (HCC)   Gout   NICM (nonischemic cardiomyopathy) (HCC)   Paroxysmal atrial fibrillation (HCC)   Acute on chronic systolic (congestive) heart failure (HCC)   ICD (implantable cardioverter-defibrillator) in place   Fluid overload  Acute on chronic systolic heart failure: Echo >  LVEF 35-40%.  Patient was started on Lasix gtt. as per CHF protocol. Medications adjusted by CHF team. Strict I's and O's, daily weights Cardiology consult appreciated. Down 27L overall and 16lbs. Patient wants to have a break from Lasix.  Stopped as per cardiology. Lasix gtt switched to Lasix 160 mg twice daily plus Diamox. Appears severely volume overloaded,  urine output dropped with intermittent dosing of Lasix. Serum creatinine up to 2.9 with 5 pound weight gain overnight. Lasix infusion resumed. Metolazone added. May require milrinone infusion if urine output not improved. PICC line inserted.  Patient feels improved,  less bloated, urine output increased with Lasix 30 mg/h   Intake/Output Summary (Last 24 hours) at 10/08/2023 1319 Last data filed at 10/08/2023 0856 Gross per 24 hour  Intake 943 ml  Output 7400 ml  Net -6457 ml    AKI on CKD stage 4: Serum creatinine remained around  baseline. Creatinine 2.46 -> 2.09>2.3>2.0>1.93>2.39>2.63>2.96>2.89   Permanent atrial fibrillation Continue Amiodarone/ Eliquis. TEE/DCCV is delayed due to patient had fever 10/05/23. DCCV would be pursued once patient is adequately diuresed.   Hepatic cirrhosis (HCC) Likely secondary to NASH.   Morbid obesity (HCC)// OSA Estimated body mass index is 42.25 kg/m as calculated from the following:   Height as of this encounter: 5\' 8"  (1.727 m).   Weight as of this encounter: 126.1 kg.    Hypokalemia Replace aggressively. -Mg normal  Fever: Patient spiked fever 11/21, pancultures were sent. Chest x-ray, UA unremarkable. Empirically started on vancomycin, cefepime and Flagyl. Blood cultures no growth, urine cultures contaminated. Vancomycin discontinued. Remains afebrile.  DVT prophylaxis: Eliquis Code Status: Full code Family Communication: No family at bedside Disposition Plan:   Admitted for acute on chronic systolic CHF.  Cardiology is consulted,  requiring IV diuresis. Also found to have A-fib, requiring DCCV once adequately diuresed.  Consultants:  Cardiology  Procedures:  Echocardiogram.  Antimicrobials: Anti-infectives (From admission, onward)    Start     Dose/Rate Route Frequency Ordered Stop   10/06/23 0600  vancomycin (VANCOCIN) IVPB 1000 mg/200 mL premix  Status:  Discontinued        1,000 mg 200 mL/hr over 60 Minutes Intravenous Every 24 hours 10/05/23 0412 10/06/23 1053   10/05/23 1600  ceFEPIme (MAXIPIME) 2 g in sodium chloride 0.9 % 100 mL IVPB        2 g 200 mL/hr over 30 Minutes Intravenous Every 12 hours 10/05/23 0412     10/05/23 0515  vancomycin (  VANCOCIN) IVPB 1000 mg/200 mL premix        1,000 mg 200 mL/hr over 60 Minutes Intravenous Every 1 hr x 2 10/05/23 0415 10/05/23 0927   10/05/23 0500  metroNIDAZOLE (FLAGYL) IVPB 500 mg  Status:  Discontinued        500 mg 100 mL/hr over 60 Minutes Intravenous Every 12 hours 10/05/23 0402 10/06/23 1053    10/05/23 0500  vancomycin (VANCOCIN) IVPB 1000 mg/200 mL premix  Status:  Discontinued        1,000 mg 200 mL/hr over 60 Minutes Intravenous Every 1 hr x 2 10/05/23 0412 10/05/23 0414   10/05/23 0500  ceFEPIme (MAXIPIME) 2 g in sodium chloride 0.9 % 100 mL IVPB        2 g 200 mL/hr over 30 Minutes Intravenous  Once 10/05/23 0412 10/05/23 0926   10/05/23 0500  vancomycin (VANCOREADY) IVPB 2000 mg/400 mL  Status:  Discontinued        2,000 mg 200 mL/hr over 120 Minutes Intravenous  Once 10/05/23 0414 10/05/23 0415       Subjective: Patient was seen and examined at bedside.  Overnight events noted.   Patient is morbidly obese, severely deconditioned, remains on supplemental oxygen. Patient resumed on Lasix infusion.  Patient reports less bloated, reports lost some weight.  Objective: Vitals:   10/08/23 0015 10/08/23 0500 10/08/23 0812 10/08/23 1222  BP: 103/76 (!) 117/90 113/76 (!) 96/53  Pulse:   78 72  Resp: 18 17 18 18   Temp: 98.2 F (36.8 C) 98.4 F (36.9 C) (!) 97.5 F (36.4 C) (!) 97 F (36.1 C)  TempSrc: Oral Oral Oral Oral  SpO2: 100% 100% 98% 100%  Weight:  124.1 kg    Height:        Intake/Output Summary (Last 24 hours) at 10/08/2023 1319 Last data filed at 10/08/2023 0856 Gross per 24 hour  Intake 943 ml  Output 7400 ml  Net -6457 ml   Filed Weights   10/06/23 0658 10/07/23 0512 10/08/23 0500  Weight: 125.7 kg 128.1 kg 124.1 kg    Examination:  General exam: Appears comfortable, deconditioned, not in any acute distress. Respiratory system: CTA bilaterally.  Respiratory effort normal.  RR 15 Cardiovascular system: S1 & S2 heard, irregular rhythm, no murmur . Gastrointestinal system: Abdomen is nondistended, soft and non tender. Normal bowel sounds heard. Central nervous system: Alert and oriented x3. No focal neurological deficits. Extremities: Edema++, no cyanosis, no clubbing Skin: No rashes, lesions or ulcers Psychiatry: Judgement and insight appear  normal. Mood & affect appropriate.     Data Reviewed: I have personally reviewed following labs and imaging studies  CBC: Recent Labs  Lab 10/05/23 0002 10/05/23 0311 10/06/23 0314 10/07/23 0849 10/08/23 0525  WBC 21.6* 17.9* 15.7* 11.7* 9.6  HGB 12.7* 13.1 13.0 12.3* 13.1  HCT 38.6* 41.1 42.2 38.9* 40.9  MCV 81.8 83.0 84.9 83.5 81.8  PLT 214 206 179 204 213   Basic Metabolic Panel: Recent Labs  Lab 10/04/23 0400 10/04/23 2102 10/05/23 0311 10/05/23 1934 10/06/23 0314 10/06/23 2049 10/07/23 0849 10/08/23 0525 10/08/23 1241  NA 138   < > 135   < > 131* 131* 133* 132* 131*  K 4.9   < > 2.9*   < > 3.5 3.6 3.3* 2.6* 2.9*  CL 93*   < > 91*   < > 91* 94* 97* 91* 89*  CO2 26   < > 30   < > 29 25 24  27  27  GLUCOSE 84   < > 106*   < > 151* 183* 168* 93 92  BUN 39*   < > 37*   < > 44* 49* 49* 52* 51*  CREATININE 2.39*   < > 2.47*   < > 2.96* 3.03* 2.88* 2.92* 2.89*  CALCIUM 9.2   < > 8.5*   < > 8.3* 8.4* 8.6* 8.8* 8.9  MG 2.0  --  1.9  --  2.2  --  2.2 2.4  --   PHOS  --   --  3.2  --   --   --  3.4  --   --    < > = values in this interval not displayed.   GFR: Estimated Creatinine Clearance: 43.6 mL/min (A) (by C-G formula based on SCr of 2.89 mg/dL (H)). Liver Function Tests: Recent Labs  Lab 10/04/23 0400  AST 60*  ALT 43  ALKPHOS 202*  BILITOT 2.8*  PROT 6.6  ALBUMIN 2.5*   No results for input(s): "LIPASE", "AMYLASE" in the last 168 hours. No results for input(s): "AMMONIA" in the last 168 hours. Coagulation Profile: No results for input(s): "INR", "PROTIME" in the last 168 hours. Cardiac Enzymes: No results for input(s): "CKTOTAL", "CKMB", "CKMBINDEX", "TROPONINI" in the last 168 hours. BNP (last 3 results) No results for input(s): "PROBNP" in the last 8760 hours. HbA1C: No results for input(s): "HGBA1C" in the last 72 hours. CBG: No results for input(s): "GLUCAP" in the last 168 hours. Lipid Profile: No results for input(s): "CHOL", "HDL",  "LDLCALC", "TRIG", "CHOLHDL", "LDLDIRECT" in the last 72 hours. Thyroid Function Tests: No results for input(s): "TSH", "T4TOTAL", "FREET4", "T3FREE", "THYROIDAB" in the last 72 hours. Anemia Panel: No results for input(s): "VITAMINB12", "FOLATE", "FERRITIN", "TIBC", "IRON", "RETICCTPCT" in the last 72 hours. Sepsis Labs: Recent Labs  Lab 10/05/23 0002  LATICACIDVEN 1.8    Recent Results (from the past 240 hour(s))  Culture, blood (Routine X 2) w Reflex to ID Panel     Status: None (Preliminary result)   Collection Time: 10/05/23 12:02 AM   Specimen: BLOOD RIGHT ARM  Result Value Ref Range Status   Specimen Description BLOOD RIGHT ARM  Final   Special Requests   Final    BOTTLES DRAWN AEROBIC AND ANAEROBIC Blood Culture results may not be optimal due to an excessive volume of blood received in culture bottles   Culture   Final    NO GROWTH 3 DAYS Performed at [facility], [address], [place], [place] [address]    Report Status PENDING  Incomplete  Culture, blood (Routine X 2) w Reflex to ID Panel     Status: None (Preliminary result)   Collection Time: 10/05/23 12:05 AM   Specimen: BLOOD LEFT ARM  Result Value Ref Range Status   Specimen Description BLOOD LEFT ARM  Final   Special Requests   Final    BOTTLES DRAWN AEROBIC AND ANAEROBIC Blood Culture results may not be optimal due to an excessive volume of blood received in culture bottles   Culture   Final    NO GROWTH 3 DAYS Performed at [facility], [address], [place], [place] [address]    Report Status PENDING  Incomplete  Urine Culture (for pregnant, neutropenic or urologic patients or patients with an indwelling urinary catheter)     Status: Abnormal   Collection Time: 10/05/23  5:42 AM   Specimen: Urine, Clean Catch  Result Value Ref Range Status  Specimen Description URINE, CLEAN CATCH  Final   Special Requests NONE  Final   Culture (A)  Final    <10,000 COLONIES/mL INSIGNIFICANT  GROWTH Performed at [facility], [address], [place], [place] [address]    Report Status 10/06/2023 FINAL  Final  Respiratory (~20 pathogens) panel by PCR     Status: None   Collection Time: 10/05/23 12:02 PM   Specimen: Nasopharyngeal Swab; Respiratory  Result Value Ref Range Status   Adenovirus NOT DETECTED NOT DETECTED Final   Coronavirus 229E NOT DETECTED NOT DETECTED Final    Comment: (NOTE) The Coronavirus on the Respiratory Panel, DOES NOT test for the novel  Coronavirus (2019 nCoV)    Coronavirus HKU1 NOT DETECTED NOT DETECTED Final   Coronavirus NL63 NOT DETECTED NOT DETECTED Final   Coronavirus OC43 NOT DETECTED NOT DETECTED Final   Metapneumovirus NOT DETECTED NOT DETECTED Final   Rhinovirus / Enterovirus NOT DETECTED NOT DETECTED Final   Influenza A NOT DETECTED NOT DETECTED Final   Influenza B NOT DETECTED NOT DETECTED Final   Parainfluenza Virus 1 NOT DETECTED NOT DETECTED Final   Parainfluenza Virus 2 NOT DETECTED NOT DETECTED Final   Parainfluenza Virus 3 NOT DETECTED NOT DETECTED Final   Parainfluenza Virus 4 NOT DETECTED NOT DETECTED Final   Respiratory Syncytial Virus NOT DETECTED NOT DETECTED Final   Bordetella pertussis NOT DETECTED NOT DETECTED Final   Bordetella Parapertussis NOT DETECTED NOT DETECTED Final   Chlamydophila pneumoniae NOT DETECTED NOT DETECTED Final   Mycoplasma pneumoniae NOT DETECTED NOT DETECTED Final    Comment: Performed at [facility], [address], [place], [place] [address]     Radiology Studies: VAS [place] LOWER EXTREMITY VENOUS (DVT)  Result Date: 10/07/2023  Lower Venous DVT Study Patient Name:  MARTREZ MAHAFFY [facility]  Date of Exam:   10/06/2023 Medical Rec #: [id number]           Accession #:    [id number] Date of Birth: 07-16-1981          Patient Gender: M Patient Age:   55 years Exam Location:  [facility] Procedure:      VAS [place] LOWER EXTREMITY VENOUS (DVT) Referring Phys: [place] LEE  --------------------------------------------------------------------------------  Indications: Swelling, and Edema. Other Indications: Atrial flutter. Limitations: Bandages. Comparison Study: No prior exam. Performing Technologist: Fernande Bras  Examination Guidelines: A complete evaluation includes B-mode imaging, spectral Doppler, color Doppler, and power Doppler as needed of all accessible portions of each vessel. Bilateral testing is considered an integral part of a complete examination. Limited examinations for reoccurring indications may be performed as noted. The reflux portion of the exam is performed with the patient in reverse Trendelenburg.  +---------+---------------+---------+-----------+----------+-------------------+ RIGHT    CompressibilityPhasicitySpontaneityPropertiesThrombus Aging      +---------+---------------+---------+-----------+----------+-------------------+ CFV      Full           Yes      Yes                                      +---------+---------------+---------+-----------+----------+-------------------+ SFJ      Full                                                             +---------+---------------+---------+-----------+----------+-------------------+  FV Prox  Full                                                             +---------+---------------+---------+-----------+----------+-------------------+ FV Mid   Full                                                             +---------+---------------+---------+-----------+----------+-------------------+ FV DistalFull                                                             +---------+---------------+---------+-----------+----------+-------------------+ PFV      Full                                                             +---------+---------------+---------+-----------+----------+-------------------+ POP      Full           Yes      Yes                                       +---------+---------------+---------+-----------+----------+-------------------+ PTV                                                   Not well visualized +---------+---------------+---------+-----------+----------+-------------------+ PERO                                                  Not well visualized +---------+---------------+---------+-----------+----------+-------------------+ PTVs and Peros not visualized due to bandages.  +----+---------------+---------+-----------+----------+--------------+ LEFTCompressibilityPhasicitySpontaneityPropertiesThrombus Aging +----+---------------+---------+-----------+----------+--------------+ CFV Full           Yes      Yes                                 +----+---------------+---------+-----------+----------+--------------+     Summary: RIGHT: - There is no evidence of deep vein thrombosis in the lower extremity.  - No cystic structure found in the popliteal fossa.  LEFT: - No evidence of common femoral vein obstruction.   *See table(s) above for measurements and observations. Electronically signed by Gerarda Fraction on 10/07/2023 at 11:40:08 AM.    Final    DG CHEST PORT 1 VIEW  Result Date: 10/06/2023 CLINICAL DATA:  Central line placement EXAM: PORTABLE CHEST 1 VIEW COMPARISON:  Chest x-ray 10/04/2023 FINDINGS: Left-sided pacemaker is present. The heart is enlarged. Right upper  extremity PICC terminates in the SVC. There is no pneumothorax. The lungs are clear. There is no pleural effusion. No acute osseous abnormalities. IMPRESSION: 1. Right upper extremity PICC terminates in the SVC. No pneumothorax. 2. Cardiomegaly. Electronically Signed   By: Darliss Cheney M.D.   On: 10/06/2023 19:23   [place] EKG SITE RITE  Result Date: 10/06/2023 If Site Rite image not attached, placement could not be confirmed due to current cardiac rhythm.   Scheduled Meds:  allopurinol  200 mg Oral Daily   amiodarone  200 mg Oral Daily    apixaban  5 mg Oral BID   Chlorhexidine Gluconate Cloth  6 each Topical Daily   empagliflozin  10 mg Oral Daily   levothyroxine  50 mcg Oral Q0600   mexiletine  150 mg Oral Q12H   pantoprazole  40 mg Oral BID   potassium chloride  40 mEq Oral Q4H   sodium chloride flush  10-40 mL Intracatheter Q12H   spironolactone  12.5 mg Oral Daily   Continuous Infusions:  ceFEPime (MAXIPIME) IV 2 g (10/08/23 0541)   furosemide (LASIX) 200 mg in dextrose 5 % 100 mL (2 mg/mL) infusion 30 mg/hr (10/08/23 0821)     LOS: 8 days    Time spent: 35 mins    Willeen Niece, MD [facility]   If 7PM-7AM, please contact night-coverage

## 2023-10-09 ENCOUNTER — Inpatient Hospital Stay (HOSPITAL_COMMUNITY): Payer: 59

## 2023-10-09 DIAGNOSIS — I5023 Acute on chronic systolic (congestive) heart failure: Secondary | ICD-10-CM | POA: Diagnosis not present

## 2023-10-09 LAB — BASIC METABOLIC PANEL
Anion gap: 15 (ref 5–15)
Anion gap: 12 (ref 5–15)
BUN: 51 mg/dL — ABNORMAL HIGH (ref 6–20)
BUN: 46 mg/dL — ABNORMAL HIGH (ref 6–20)
CO2: 27 mmol/L (ref 22–32)
CO2: 28 mmol/L (ref 22–32)
Calcium: 8.8 mg/dL — ABNORMAL LOW (ref 8.9–10.3)
Calcium: 8.4 mg/dL — ABNORMAL LOW (ref 8.9–10.3)
Chloride: 90 mmol/L — ABNORMAL LOW (ref 98–111)
Chloride: 91 mmol/L — ABNORMAL LOW (ref 98–111)
Creatinine, Ser: 2.9 mg/dL — ABNORMAL HIGH (ref 0.61–1.24)
Creatinine, Ser: 2.55 mg/dL — ABNORMAL HIGH (ref 0.61–1.24)
GFR, Estimated: 27 mL/min — ABNORMAL LOW (ref >60)
GFR, Estimated: 32 mL/min — ABNORMAL LOW (ref >60)
Glucose, Bld: 92 mg/dL (ref 70–99)
Glucose, Bld: 130 mg/dL — ABNORMAL HIGH (ref 70–99)
Potassium: 2.9 mmol/L — ABNORMAL LOW (ref 3.5–5.1)
Potassium: 3.3 mmol/L — ABNORMAL LOW (ref 3.5–5.1)
Sodium: 132 mmol/L — ABNORMAL LOW (ref 135–145)
Sodium: 131 mmol/L — ABNORMAL LOW (ref 135–145)

## 2023-10-09 LAB — COOXEMETRY PANEL
Carboxyhemoglobin: 1.9 % — ABNORMAL HIGH (ref 0.5–1.5)
Methemoglobin: <0.7 % (ref 0.0–1.5)
O2 Saturation: 80.8 %
Total hemoglobin: 14.6 g/dL (ref 12.0–16.0)

## 2023-10-09 LAB — MAGNESIUM: Magnesium: 2.1 mg/dL (ref 1.7–2.4)

## 2023-10-09 MED ORDER — POTASSIUM CHLORIDE CRYS ER 20 MEQ PO TBCR
60.0000 meq | EXTENDED_RELEASE_TABLET | Freq: Once | ORAL | Status: AC
  Administered 2023-10-09: 60 meq via ORAL
  Filled 2023-10-09: qty 3

## 2023-10-09 MED ORDER — ALTEPLASE 2 MG IJ SOLR
2.0000 mg | Freq: Once | INTRAMUSCULAR | Status: AC
Start: 2023-10-09 — End: 2023-10-09
  Administered 2023-10-09: 2 mg
  Filled 2023-10-09: qty 2

## 2023-10-09 MED ORDER — POTASSIUM CHLORIDE CRYS ER 20 MEQ PO TBCR
40.0000 meq | EXTENDED_RELEASE_TABLET | ORAL | Status: AC
  Administered 2023-10-09 (×2): 40 meq via ORAL
  Filled 2023-10-09 (×2): qty 2

## 2023-10-09 MED ORDER — POTASSIUM CHLORIDE CRYS ER 20 MEQ PO TBCR: 40.0000 meq | EXTENDED_RELEASE_TABLET | Freq: Four times a day (QID) | ORAL | Status: DC

## 2023-10-09 MED ORDER — POTASSIUM CHLORIDE CRYS ER 20 MEQ PO TBCR
60.0000 meq | EXTENDED_RELEASE_TABLET | Freq: Four times a day (QID) | ORAL | Status: DC
  Administered 2023-10-09 – 2023-10-11 (×9): 60 meq via ORAL
  Filled 2023-10-09 (×9): qty 3

## 2023-10-09 MED ORDER — ACETAZOLAMIDE 250 MG PO TABS
250.0000 mg | ORAL_TABLET | Freq: Once | ORAL | Status: AC
  Administered 2023-10-09: 250 mg via ORAL
  Filled 2023-10-09: qty 1

## 2023-10-09 NOTE — TOC Progression Note (Signed)
Transition of Care [facility]) - Progression Note    Patient Details  Name: Bradley Powell MRN: [id number] Date of Birth: [birth year]  [facility] CM/SW Contact  Nicanor Bake Phone Number: [phone] 10/09/2023, 2:58 PM  Clinical Narrative:  HF CSW met with pt at bedside. Pt appeared to be in better spirits today. Pt stated that he was feeling, "a little better." CSW and pt spent some time speaking about family, where he was at mentally, and how he was currently feeling. Pt stated that he would like to continue focusing on getting more weight off of him and continue getting better.  TOC will continue following.      Expected Discharge Plan: Home/Self Care Barriers to Discharge: Continued Medical Work up  Expected Discharge Plan and Services       Living arrangements for the past 2 months: Single Family Home                                       Social Determinants of Health (SDOH) Interventions SDOH Screenings   Food Insecurity: No Food Insecurity (09/30/2023)  Housing: Low Risk  (09/30/2023)  Transportation Needs: No Transportation Needs (09/30/2023)  Utilities: Not At Risk (09/30/2023)  Tobacco Use: Low Risk  (09/29/2023)    Readmission Risk Interventions     No data to display

## 2023-10-09 NOTE — Progress Notes (Signed)
0019  10/09/23 0019  BiPAP/CPAP/SIPAP  Reason BIPAP/CPAP not in use Non-compliant

## 2023-10-09 NOTE — Progress Notes (Signed)
Advanced Heart Failure Rounding Note   Subjective:    Co-ox 81%. Lasix gtt 30/hr. 7L UOP. CVP 17  sCr remains 2.9.  Ambulating in room. BM this morning. Feeling better with weight loss. Continues to have R knee pain. No SOB. Endorses cough.   Objective:    Echo 11/19: EF 35-40%, LV with GHK, RV mod reduced, mildly elevated PASP, RVSP ~39, trivial MR   Weight Range:  Vital Signs:   Temp:  [97 F (36.1 C)-97.8 F (36.6 C)] 97.6 F (36.4 C) (11/25 0800) Pulse Rate:  [71-78] 77 (11/25 0800) Resp:  [17-20] 18 (11/25 0800) BP: (96-113)/(53-76) 110/66 (11/25 0800) SpO2:  [98 %-100 %] 100 % (11/25 0800) Weight:  [122.6 kg] 122.6 kg (11/25 0640) Last BM Date : 10/08/23  Weight change: Filed Weights   10/07/23 0512 10/08/23 0500 10/09/23 0640  Weight: 128.1 kg 124.1 kg 122.6 kg    Intake/Output:   Intake/Output Summary (Last 24 hours) at 10/09/2023 0809 Last data filed at 10/09/2023 0803 Gross per 24 hour  Intake 1182 ml  Output 6900 ml  Net -5718 ml     Physical Exam: CVP 17 General: Ambulating in room. No distress on RA HEENT: neck supple.   Cardiac: JVP to midneck. S1 and S2 present. No murmurs or rub. Resp: Lung sounds clear and equal B/L Abdomen: Soft, non-tender, non-distended. Obese. + BS. Extremities: Warm and dry. No rash, cyanosis.  3+ edema BLE.  Neuro: Alert and oriented x3. Affect pleasant. Moves all extremities without difficulty. Lines/Devices:  RUE PICC  Telemetry: AFib 70-80s (personally reviewed)  Labs: Basic Metabolic Panel: Recent Labs  Lab 10/04/23 0400 10/04/23 2102 10/05/23 0311 10/05/23 1934 10/06/23 0314 10/06/23 2049 10/07/23 0849 10/08/23 0525 10/08/23 1241 10/09/23 0436  NA 138   < > 135   < > 131* 131* 133* 132* 131* 132*  K 4.9   < > 2.9*   < > 3.5 3.6 3.3* 2.6* 2.9* 2.9*  CL 93*   < > 91*   < > 91* 94* 97* 91* 89* 90*  CO2 26   < > 30   < > 29 25 24 27 27 27   GLUCOSE 84   < > 106*   < > 151* 183* 168* 93 92 92   BUN 39*   < > 37*   < > 44* 49* 49* 52* 51* 51*  CREATININE 2.39*   < > 2.47*   < > 2.96* 3.03* 2.88* 2.92* 2.89* 2.90*  CALCIUM 9.2   < > 8.5*   < > 8.3* 8.4* 8.6* 8.8* 8.9 8.8*  MG 2.0  --  1.9  --  2.2  --  2.2 2.4  --   --   PHOS  --   --  3.2  --   --   --  3.4  --   --   --    < > = values in this interval not displayed.    Liver Function Tests: Recent Labs  Lab 10/04/23 0400  AST 60*  ALT 43  ALKPHOS 202*  BILITOT 2.8*  PROT 6.6  ALBUMIN 2.5*   No results for input(s): "LIPASE", "AMYLASE" in the last 168 hours. No results for input(s): "AMMONIA" in the last 168 hours.  CBC: Recent Labs  Lab 10/05/23 0002 10/05/23 0311 10/06/23 0314 10/07/23 0849 10/08/23 0525  WBC 21.6* 17.9* 15.7* 11.7* 9.6  HGB 12.7* 13.1 13.0 12.3* 13.1  HCT 38.6* 41.1 42.2 38.9* 40.9  MCV  81.8 83.0 84.9 83.5 81.8  PLT 214 206 179 204 213    Cardiac Enzymes: No results for input(s): "CKTOTAL", "CKMB", "CKMBINDEX", "TROPONINI" in the last 168 hours.  BNP: BNP (last 3 results) Recent Labs    04/18/23 1610 05/23/23 1618 09/30/23 0513  BNP 171.7* 209.9* 222.1*    ProBNP (last 3 results) No results for input(s): "PROBNP" in the last 8760 hours.  Other results:  Imaging: No results found.   Medications:    Scheduled Medications:  allopurinol  200 mg Oral Daily   amiodarone  200 mg Oral Daily   apixaban  5 mg Oral BID   Chlorhexidine Gluconate Cloth  6 each Topical Daily   empagliflozin  10 mg Oral Daily   levothyroxine  50 mcg Oral Q0600   mexiletine  150 mg Oral Q12H   pantoprazole  40 mg Oral BID   potassium chloride  40 mEq Oral Q4H   potassium chloride  60 mEq Oral Once   sodium chloride flush  10-40 mL Intracatheter Q12H   spironolactone  12.5 mg Oral Daily    Infusions:  ceFEPime (MAXIPIME) IV 2 g (10/09/23 0437)   furosemide (LASIX) 200 mg in dextrose 5 % 100 mL (2 mg/mL) infusion 30 mg/hr (10/09/23 0629)    PRN Medications: acetaminophen, baclofen,  ondansetron (ZOFRAN) IV, oxyCODONE-acetaminophen, phenol, sodium chloride, sodium chloride flush  Assessment/Plan:   1. 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 Ratio 1.26 read as equivocal.  SPEP negative - cMRI repeated (11/22): LVEF 43%, RVEF 44%, LGE concerning for sarcoid.  - Echo (4/23): EF 35-40% Moderate RV dysfunction - Has seen geneticist, Dr. Jomarie Longs. Work up c/w LMNA.  - Admitted with NYHA IIIb symptoms and 40 pound weight gain - Echo 11/19 EF 35-40%, LV with GHK, RV mod reduced, mildly elevated PASP, RVSP ~39, trivial MR  - Massively volume overloaded with R>>L HF. CVP 17. - Improved UOP with lasix 30/hr. K remains low, aggressively supp. Hold metolazone d/t hypokalemia - Continue low dose spiro to help spare K - Continue Jardiance 10 - Has been off b-blocker due to bradycardia and heart block - Off ARNi and ARB (hypotension and N/V in the past) - can rechallenge as able   2. Syncope/VT - 04/23 > secondary to VT - s/p ICD 4/23. - Continue mexiletine 150 mg bid   - Continue amiodarone 200 mg daiy - Followed by Dr. Ladona Ridgel  3. AKI on CKD Stage IIIb - Due to cardiorenal syndrome. Baseline SCr 1.7-2.0 - Admit Scr 2.46. - Peaked at 3.03. Remains elevated at 2.9. - Diuresis as above.    4. Chronic AF/AFL - S/p AFL ablation 5/19 with Dr. Ladona Ridgel. - Continue Eliquis 5 mg bid. No bleeding issues. - Continue amiodarone 200 daily for VT - No role for DC-CV as he has been in AFL for a long time   5.  OSA: Continue CPAP   6. PVCs/VT - Continue mexiletine + amiodarone. - Followed by EP   7. Obesity - Body mass index is Body mass index is 41.1 kg/m. - Consider GLP1RA  8. Hypokalemia - K 2.9, aggressively replete - No metolazone today  9. ID - per primary. WBC peaked at 21.6. Now 9 w IV abx. - UA (-), PCXR (-), RVP (-) - Blood cultures - NGTD. Afebrile - Cefepime IV for empiric  therapy   10. Gout - R>L knee/leg swelling  -  Uric acid 7.0  Length of Stay: 9  [place] LeeMD 10/09/2023, 8:09 AM  Advanced Heart Failure Team Pager [phone] (M-F; 7a - 4p)  Please contact [facility] for night-coverage after hours (4p -7a ) and weekends on [email or web address]

## 2023-10-09 NOTE — Progress Notes (Signed)
PROGRESS NOTE    Bradley Powell  GUY:[id number] DOB: November 21, 1980 DOA: 09/29/2023 PCP: [name], MD   Brief Narrative:  This 42 year old male with past medical history of paroxysmal atrial flutter s/p remote ablation, chronic combined systolic/diastolic CHF, NICM, OSA and morbid obesity.  He presents to the ER with complaints of increasing fluid retention on his legs up to his stomach.  Per patient his been compliant with his medications.  On lasix gtt directed by CHF team.  Cardiology is following. Plan is  to have DCCV when adequately diuresed.   Assessment & Plan:   Principal Problem:   Acute on chronic congestive heart failure (HCC) Active Problems:   Hepatic cirrhosis (HCC)   Edema   Morbid obesity (HCC)   Gout   NICM (nonischemic cardiomyopathy) (HCC)   Paroxysmal atrial fibrillation (HCC)   Acute on chronic systolic (congestive) heart failure (HCC)   ICD (implantable cardioverter-defibrillator) in place   Fluid overload  Acute on chronic systolic heart failure: Echo >  LVEF 35-40%.  Patient was started on Lasix gtt. as per CHF protocol. Medications adjusted by CHF team. Strict I's and O's, daily weights Cardiology consult appreciated. Down 27L overall and 16lbs. Patient wants to have a break from Lasix.  Stopped as per cardiology. Lasix gtt switched to Lasix 160 mg twice daily plus Diamox. Appears severely volume overloaded,  urine output dropped with intermittent dosing of Lasix. Serum creatinine up to 2.9 with 5 pound weight gain overnight. Lasix infusion resumed. Metolazone added. May require milrinone infusion if urine output not improved. PICC line inserted.  Patient feels improved,  less bloated, urine output increased with Lasix 30 mg/h Remains volume overloaded.  Continue IV diuresis.   Intake/Output Summary (Last 24 hours) at 10/09/2023 1410 Last data filed at 10/09/2023 0803 Gross per 24 hour  Intake 709 ml  Output 6900 ml  Net -6191 ml    AKI  on CKD stage 4: Serum creatinine remained around baseline. Creatinine 2.46 -> 2.09>2.3>2.0>1.93>2.39>2.63>2.96>2.89   Permanent atrial fibrillation Continue Amiodarone/ Eliquis. TEE/DCCV is delayed due to patient had fever 10/05/23. DCCV would be pursued once patient is adequately diuresed.   Hepatic cirrhosis (HCC) Likely secondary to NASH.   Morbid obesity (HCC)// OSA Estimated body mass index is 42.25 kg/m as calculated from the following:   Height as of this encounter: 5\' 8"  (1.727 m).   Weight as of this encounter: 126.1 kg.    Hypokalemia Replace aggressively. -Mg normal  Fever: Completed antibiotics for 5 days for pneumonia. Remains afebrile.  DVT prophylaxis: Eliquis Code Status: Full code Family Communication: No family at bedside Disposition Plan:   Admitted for acute on chronic systolic CHF.  Cardiology is consulted,  requiring IV diuresis. Also found to have A-fib, requiring DCCV once adequately diuresed.  Consultants:  Cardiology  Procedures:  Echocardiogram.  Antimicrobials: Anti-infectives (From admission, onward)    Start     Dose/Rate Route Frequency Ordered Stop   10/06/23 0600  vancomycin (VANCOCIN) IVPB 1000 mg/200 mL premix  Status:  Discontinued        1,000 mg 200 mL/hr over 60 Minutes Intravenous Every 24 hours 10/05/23 0412 10/06/23 1053   10/05/23 1600  ceFEPIme (MAXIPIME) 2 g in sodium chloride 0.9 % 100 mL IVPB  Status:  Discontinued        2 g 200 mL/hr over 30 Minutes Intravenous Every 12 hours 10/05/23 0412 10/09/23 1132   10/05/23 0515  vancomycin (VANCOCIN) IVPB 1000 mg/200 mL premix  1,000 mg 200 mL/hr over 60 Minutes Intravenous Every 1 hr x 2 10/05/23 0415 10/05/23 0927   10/05/23 0500  metroNIDAZOLE (FLAGYL) IVPB 500 mg  Status:  Discontinued        500 mg 100 mL/hr over 60 Minutes Intravenous Every 12 hours 10/05/23 0402 10/06/23 1053   10/05/23 0500  vancomycin (VANCOCIN) IVPB 1000 mg/200 mL premix  Status:   Discontinued        1,000 mg 200 mL/hr over 60 Minutes Intravenous Every 1 hr x 2 10/05/23 0412 10/05/23 0414   10/05/23 0500  ceFEPIme (MAXIPIME) 2 g in sodium chloride 0.9 % 100 mL IVPB        2 g 200 mL/hr over 30 Minutes Intravenous  Once 10/05/23 0412 10/05/23 0926   10/05/23 0500  vancomycin (VANCOREADY) IVPB 2000 mg/400 mL  Status:  Discontinued        2,000 mg 200 mL/hr over 120 Minutes Intravenous  Once 10/05/23 0414 10/05/23 0415       Subjective: Patient was seen and examined at bedside.  Overnight events noted.   Patient is morbidly obese, severely deconditioned, remains on supplemental oxygen. Patient resumed on Lasix infusion.  Patient reports less bloated, reports has lost some weight.  Objective: Vitals:   10/09/23 0412 10/09/23 0640 10/09/23 0800 10/09/23 1127  BP: 104/70  110/66 104/71  Pulse: 71  77 69  Resp: 20  18 18   Temp: 97.6 F (36.4 C)  97.6 F (36.4 C) 97.6 F (36.4 C)  TempSrc: Oral  Oral Oral  SpO2: 100%  100% 98%  Weight:  122.6 kg    Height:        Intake/Output Summary (Last 24 hours) at 10/09/2023 1410 Last data filed at 10/09/2023 0803 Gross per 24 hour  Intake 709 ml  Output 6900 ml  Net -6191 ml   Filed Weights   10/07/23 0512 10/08/23 0500 10/09/23 0640  Weight: 128.1 kg 124.1 kg 122.6 kg    Examination:  General exam: Appears comfortable, deconditioned, not in any acute distress. Respiratory system: CTA bilaterally.  Respiratory effort normal.  RR 15 Cardiovascular system: S1 & S2 heard, irregular rhythm, no murmur . Gastrointestinal system: Abdomen is nondistended, soft and non tender. Normal bowel sounds heard. Central nervous system: Alert and oriented x3. No focal neurological deficits. Extremities: Edema++, no cyanosis, no clubbing Skin: No rashes, lesions or ulcers Psychiatry: Judgement and insight appear normal. Mood & affect appropriate.     Data Reviewed: I have personally reviewed following labs and imaging  studies  CBC: Recent Labs  Lab 10/05/23 0002 10/05/23 0311 10/06/23 0314 10/07/23 0849 10/08/23 0525  WBC 21.6* 17.9* 15.7* 11.7* 9.6  HGB 12.7* 13.1 13.0 12.3* 13.1  HCT 38.6* 41.1 42.2 38.9* 40.9  MCV 81.8 83.0 84.9 83.5 81.8  PLT 214 206 179 204 213   Basic Metabolic Panel: Recent Labs  Lab 10/04/23 0400 10/04/23 2102 10/05/23 0311 10/05/23 1934 10/06/23 0314 10/06/23 2049 10/07/23 0849 10/08/23 0525 10/08/23 1241 10/09/23 0436  NA 138   < > 135   < > 131* 131* 133* 132* 131* 132*  K 4.9   < > 2.9*   < > 3.5 3.6 3.3* 2.6* 2.9* 2.9*  CL 93*   < > 91*   < > 91* 94* 97* 91* 89* 90*  CO2 26   < > 30   < > 29 25 24 27 27 27   GLUCOSE 84   < > 106*   < >  151* 183* 168* 93 92 92  BUN 39*   < > 37*   < > 44* 49* 49* 52* 51* 51*  CREATININE 2.39*   < > 2.47*   < > 2.96* 3.03* 2.88* 2.92* 2.89* 2.90*  CALCIUM 9.2   < > 8.5*   < > 8.3* 8.4* 8.6* 8.8* 8.9 8.8*  MG 2.0  --  1.9  --  2.2  --  2.2 2.4  --   --   PHOS  --   --  3.2  --   --   --  3.4  --   --   --    < > = values in this interval not displayed.   GFR: Estimated Creatinine Clearance: 43.2 mL/min (A) (by C-G formula based on SCr of 2.9 mg/dL (H)). Liver Function Tests: Recent Labs  Lab 10/04/23 0400  AST 60*  ALT 43  ALKPHOS 202*  BILITOT 2.8*  PROT 6.6  ALBUMIN 2.5*   No results for input(s): "LIPASE", "AMYLASE" in the last 168 hours. No results for input(s): "AMMONIA" in the last 168 hours. Coagulation Profile: No results for input(s): "INR", "PROTIME" in the last 168 hours. Cardiac Enzymes: No results for input(s): "CKTOTAL", "CKMB", "CKMBINDEX", "TROPONINI" in the last 168 hours. BNP (last 3 results) No results for input(s): "PROBNP" in the last 8760 hours. HbA1C: No results for input(s): "HGBA1C" in the last 72 hours. CBG: No results for input(s): "GLUCAP" in the last 168 hours. Lipid Profile: No results for input(s): "CHOL", "HDL", "LDLCALC", "TRIG", "CHOLHDL", "LDLDIRECT" in the last 72  hours. Thyroid Function Tests: No results for input(s): "TSH", "T4TOTAL", "FREET4", "T3FREE", "THYROIDAB" in the last 72 hours. Anemia Panel: No results for input(s): "VITAMINB12", "FOLATE", "FERRITIN", "TIBC", "IRON", "RETICCTPCT" in the last 72 hours. Sepsis Labs: Recent Labs  Lab 10/05/23 0002  LATICACIDVEN 1.8    Recent Results (from the past 240 hour(s))  Culture, blood (Routine X 2) w Reflex to ID Panel     Status: None (Preliminary result)   Collection Time: 10/05/23 12:02 AM   Specimen: BLOOD RIGHT ARM  Result Value Ref Range Status   Specimen Description BLOOD RIGHT ARM  Final   Special Requests   Final    BOTTLES DRAWN AEROBIC AND ANAEROBIC Blood Culture results may not be optimal due to an excessive volume of blood received in culture bottles   Culture   Final    NO GROWTH 4 DAYS Performed at [facility], [address] [place], [place] [address]    Report Status PENDING  Incomplete  Culture, blood (Routine X 2) w Reflex to ID Panel     Status: None (Preliminary result)   Collection Time: 10/05/23 12:05 AM   Specimen: BLOOD LEFT ARM  Result Value Ref Range Status   Specimen Description BLOOD LEFT ARM  Final   Special Requests   Final    BOTTLES DRAWN AEROBIC AND ANAEROBIC Blood Culture results may not be optimal due to an excessive volume of blood received in culture bottles   Culture   Final    NO GROWTH 4 DAYS Performed at [facility], [address], [place], [place] [address]    Report Status PENDING  Incomplete  Urine Culture (for pregnant, neutropenic or urologic patients or patients with an indwelling urinary catheter)     Status: Abnormal   Collection Time: 10/05/23  5:42 AM   Specimen: Urine, Clean Catch  Result Value Ref Range Status   Specimen  Description URINE, CLEAN CATCH  Final   Special Requests NONE  Final   Culture (A)  Final    <10,000 COLONIES/mL INSIGNIFICANT GROWTH Performed at [facility], [address]  [place], [place] [address]    Report Status 10/06/2023 FINAL  Final  Respiratory (~20 pathogens) panel by PCR     Status: None   Collection Time: 10/05/23 12:02 PM   Specimen: Nasopharyngeal Swab; Respiratory  Result Value Ref Range Status   Adenovirus NOT DETECTED NOT DETECTED Final   Coronavirus 229E NOT DETECTED NOT DETECTED Final    Comment: (NOTE) The Coronavirus on the Respiratory Panel, DOES NOT test for the novel  Coronavirus (2019 nCoV)    Coronavirus HKU1 NOT DETECTED NOT DETECTED Final   Coronavirus NL63 NOT DETECTED NOT DETECTED Final   Coronavirus OC43 NOT DETECTED NOT DETECTED Final   Metapneumovirus NOT DETECTED NOT DETECTED Final   Rhinovirus / Enterovirus NOT DETECTED NOT DETECTED Final   Influenza A NOT DETECTED NOT DETECTED Final   Influenza B NOT DETECTED NOT DETECTED Final   Parainfluenza Virus 1 NOT DETECTED NOT DETECTED Final   Parainfluenza Virus 2 NOT DETECTED NOT DETECTED Final   Parainfluenza Virus 3 NOT DETECTED NOT DETECTED Final   Parainfluenza Virus 4 NOT DETECTED NOT DETECTED Final   Respiratory Syncytial Virus NOT DETECTED NOT DETECTED Final   Bordetella pertussis NOT DETECTED NOT DETECTED Final   Bordetella Parapertussis NOT DETECTED NOT DETECTED Final   Chlamydophila pneumoniae NOT DETECTED NOT DETECTED Final   Mycoplasma pneumoniae NOT DETECTED NOT DETECTED Final    Comment: Performed at [facility], [address], [place], [place] [address]     Radiology Studies: No results found.  Scheduled Meds:  allopurinol  200 mg Oral Daily   amiodarone  200 mg Oral Daily   apixaban  5 mg Oral BID   Chlorhexidine Gluconate Cloth  6 each Topical Daily   empagliflozin  10 mg Oral Daily   levothyroxine  50 mcg Oral Q0600   mexiletine  150 mg Oral Q12H   pantoprazole  40 mg Oral BID   potassium chloride  60 mEq Oral Q6H   sodium chloride flush  10-40 mL Intracatheter Q12H   spironolactone  12.5 mg Oral Daily   Continuous Infusions:  furosemide  (LASIX) 200 mg in dextrose 5 % 100 mL (2 mg/mL) infusion 30 mg/hr (10/09/23 1344)     LOS: 9 days    Time spent: 35 mins    Willeen Niece, MD [facility]   If 7PM-7AM, please contact night-coverage

## 2023-10-09 NOTE — Progress Notes (Signed)
Unable to remove TPA at this time due to occluded line. Ordered chest x ray per protocol in order to check for kinks in PICC line.

## 2023-10-09 NOTE — Progress Notes (Signed)
Orthopedic Tech Progress Note Patient Details:  Bradley Powell 25-Oct-1981 [id number]  Ortho Devices Type of Ortho Device: Radio broadcast assistant Ortho Device/Splint Location: Bi lat Ortho Device/Splint Interventions: Ordered, Application   Post Interventions Patient Tolerated: Well  [name] 10/09/2023, 1:48 PM

## 2023-10-10 DIAGNOSIS — I5043 Acute on chronic combined systolic (congestive) and diastolic (congestive) heart failure: Secondary | ICD-10-CM | POA: Diagnosis not present

## 2023-10-10 DIAGNOSIS — I5023 Acute on chronic systolic (congestive) heart failure: Secondary | ICD-10-CM | POA: Diagnosis not present

## 2023-10-10 LAB — CBC
HCT: 39.9 % (ref 39.0–52.0)
Hemoglobin: 12.9 g/dL — ABNORMAL LOW (ref 13.0–17.0)
MCH: 26.7 pg (ref 26.0–34.0)
MCHC: 32.3 g/dL (ref 30.0–36.0)
MCV: 82.6 fL (ref 80.0–100.0)
Platelets: 263 10*3/uL (ref 150–400)
RBC: 4.83 MIL/uL (ref 4.22–5.81)
RDW: 15.7 % — ABNORMAL HIGH (ref 11.5–15.5)
WBC: 13.7 10*3/uL — ABNORMAL HIGH (ref 4.0–10.5)
nRBC: 0.3 % — ABNORMAL HIGH (ref 0.0–0.2)

## 2023-10-10 LAB — CULTURE, BLOOD (ROUTINE X 2)
Culture: NO GROWTH
Culture: NO GROWTH

## 2023-10-10 LAB — BASIC METABOLIC PANEL
Anion gap: 12 (ref 5–15)
Anion gap: 12 (ref 5–15)
BUN: 44 mg/dL — ABNORMAL HIGH (ref 6–20)
BUN: 44 mg/dL — ABNORMAL HIGH (ref 6–20)
CO2: 28 mmol/L (ref 22–32)
CO2: 26 mmol/L (ref 22–32)
Calcium: 8.6 mg/dL — ABNORMAL LOW (ref 8.9–10.3)
Calcium: 8.7 mg/dL — ABNORMAL LOW (ref 8.9–10.3)
Chloride: 93 mmol/L — ABNORMAL LOW (ref 98–111)
Chloride: 94 mmol/L — ABNORMAL LOW (ref 98–111)
Creatinine, Ser: 2.52 mg/dL — ABNORMAL HIGH (ref 0.61–1.24)
Creatinine, Ser: 2.7 mg/dL — ABNORMAL HIGH (ref 0.61–1.24)
GFR, Estimated: 32 mL/min — ABNORMAL LOW (ref >60)
GFR, Estimated: 30 mL/min — ABNORMAL LOW (ref >60)
Glucose, Bld: 98 mg/dL (ref 70–99)
Glucose, Bld: 145 mg/dL — ABNORMAL HIGH (ref 70–99)
Potassium: 3.1 mmol/L — ABNORMAL LOW (ref 3.5–5.1)
Potassium: 3.8 mmol/L (ref 3.5–5.1)
Sodium: 133 mmol/L — ABNORMAL LOW (ref 135–145)
Sodium: 132 mmol/L — ABNORMAL LOW (ref 135–145)

## 2023-10-10 LAB — COOXEMETRY PANEL
Carboxyhemoglobin: 0.8 % (ref 0.5–1.5)
Methemoglobin: <0.7 % (ref 0.0–1.5)
O2 Saturation: 85.1 %
Total hemoglobin: 13.5 g/dL (ref 12.0–16.0)

## 2023-10-10 LAB — MAGNESIUM: Magnesium: 2.2 mg/dL (ref 1.7–2.4)

## 2023-10-10 MED ORDER — ACETAZOLAMIDE 250 MG PO TABS
250.0000 mg | ORAL_TABLET | Freq: Two times a day (BID) | ORAL | Status: DC
  Administered 2023-10-10 – 2023-10-16 (×14): 250 mg via ORAL
  Filled 2023-10-10 (×15): qty 1

## 2023-10-10 MED ORDER — POTASSIUM CHLORIDE CRYS ER 20 MEQ PO TBCR
60.0000 meq | EXTENDED_RELEASE_TABLET | Freq: Once | ORAL | Status: AC
  Administered 2023-10-10: 60 meq via ORAL
  Filled 2023-10-10: qty 3

## 2023-10-10 MED ORDER — METOLAZONE 5 MG PO TABS
5.0000 mg | ORAL_TABLET | Freq: Once | ORAL | Status: AC
  Administered 2023-10-10: 5 mg via ORAL
  Filled 2023-10-10: qty 1

## 2023-10-10 MED ORDER — POTASSIUM CHLORIDE CRYS ER 20 MEQ PO TBCR: 40.0000 meq | EXTENDED_RELEASE_TABLET | Freq: Once | ORAL | Status: DC

## 2023-10-10 NOTE — Progress Notes (Addendum)
Advanced Heart Failure Rounding Note   Subjective:    Co-ox 85%. Lasix gtt 30/hr. 5.5L UOP. Weight continues to trend down. Will give Metolazone + Diamox 250 BID today. sCr improving with diuresis 2.9>2.52  Feeling good but tired this am. Sitting on the edge of bed eating breakfast. No SOB, No CP  Objective:    Echo 11/19: EF 35-40%, LV with GHK, RV mod reduced, mildly elevated PASP, RVSP ~39, trivial MR   Weight Range:  Vital Signs:   Temp:  [97.1 F (36.2 C)-98.4 F (36.9 C)] 98.4 F (36.9 C) (11/26 0529) Pulse Rate:  [69-83] 83 (11/26 0529) Resp:  [17-18] 18 (11/26 0529) BP: (93-108)/(67-88) 108/88 (11/26 0529) SpO2:  [98 %-100 %] 100 % (11/26 0529) Weight:  [121.4 kg] 121.4 kg (11/26 0529) Last BM Date : 10/09/23  Weight change: Filed Weights   10/08/23 0500 10/09/23 0640 10/10/23 0529  Weight: 124.1 kg 122.6 kg 121.4 kg    Intake/Output:   Intake/Output Summary (Last 24 hours) at 10/10/2023 0807 Last data filed at 10/10/2023 0700 Gross per 24 hour  Intake 450.22 ml  Output 5380 ml  Net -4929.78 ml     Physical Exam: CVP 19-20 General: Well appearing. No distress on RA HEENT: neck supple.   Cardiac: JVP to midneck. S1 and S2 present. No murmurs or rub. Resp: Lung sounds clear and equal B/L Abdomen: Soft, non-tender, non-distended. + BS. Extremities: Warm and dry. No rash, cyanosis.  3+ edema. +Unna boots Neuro: Alert and oriented x3. Affect pleasant. Moves all extremities without difficulty. Lines/Devices: RUE PICC  Telemetry: A-Flutter 80-100s (personally reviewed)  Labs: Basic Metabolic Panel: Recent Labs  Lab 10/05/23 0311 10/05/23 1934 10/06/23 0314 10/06/23 2049 10/07/23 0849 10/08/23 0525 10/08/23 1241 10/09/23 0436 10/09/23 1417 10/10/23 0545  NA 135   < > 131*   < > 133* 132* 131* 132* 131* 133*  K 2.9*   < > 3.5   < > 3.3* 2.6* 2.9* 2.9* 3.3* 3.1*  CL 91*   < > 91*   < > 97* 91* 89* 90* 91* 93*  CO2 30   < > 29   < > 24 27  27 27 28 28   GLUCOSE 106*   < > 151*   < > 168* 93 92 92 130* 98  BUN 37*   < > 44*   < > 49* 52* 51* 51* 46* 44*  CREATININE 2.47*   < > 2.96*   < > 2.88* 2.92* 2.89* 2.90* 2.55* 2.52*  CALCIUM 8.5*   < > 8.3*   < > 8.6* 8.8* 8.9 8.8* 8.4* 8.6*  MG 1.9  --  2.2  --  2.2 2.4  --   --  2.1 2.2  PHOS 3.2  --   --   --  3.4  --   --   --   --   --    < > = values in this interval not displayed.   Liver Function Tests: Recent Labs  Lab 10/04/23 0400  AST 60*  ALT 43  ALKPHOS 202*  BILITOT 2.8*  PROT 6.6  ALBUMIN 2.5*   No results for input(s): "LIPASE", "AMYLASE" in the last 168 hours. No results for input(s): "AMMONIA" in the last 168 hours.  CBC: Recent Labs  Lab 10/05/23 0311 10/06/23 0314 10/07/23 0849 10/08/23 0525 10/10/23 0545  WBC 17.9* 15.7* 11.7* 9.6 13.7*  HGB 13.1 13.0 12.3* 13.1 12.9*  HCT 41.1 42.2 38.9* 40.9 39.9  MCV 83.0 84.9 83.5 81.8 82.6  PLT 206 179 204 213 263   Cardiac Enzymes: No results for input(s): "CKTOTAL", "CKMB", "CKMBINDEX", "TROPONINI" in the last 168 hours.  BNP: BNP (last 3 results) Recent Labs    04/18/23 1610 05/23/23 1618 09/30/23 0513  BNP 171.7* 209.9* 222.1*   ProBNP (last 3 results) No results for input(s): "PROBNP" in the last 8760 hours.  Imaging: DG CHEST PORT 1 VIEW  Result Date: 10/09/2023 CLINICAL DATA:  PICC line EXAM: PORTABLE CHEST 1 VIEW COMPARISON:  10/06/2023 FINDINGS: Left-sided pacing device. Right upper extremity central venous catheter tip over the SVC. Borderline to mild cardiomegaly. No acute airspace disease, pleural effusion or pneumothorax IMPRESSION: Right upper extremity central venous catheter tip over the SVC. Electronically Signed   By: Jasmine Pang M.D.   On: 10/09/2023 20:37    Medications:    Scheduled Medications:  allopurinol  200 mg Oral Daily   amiodarone  200 mg Oral Daily   apixaban  5 mg Oral BID   Chlorhexidine Gluconate Cloth  6 each Topical Daily   empagliflozin  10 mg Oral  Daily   levothyroxine  50 mcg Oral Q0600   mexiletine  150 mg Oral Q12H   pantoprazole  40 mg Oral BID   potassium chloride  40 mEq Oral Once   potassium chloride  60 mEq Oral Q6H   sodium chloride flush  10-40 mL Intracatheter Q12H   spironolactone  12.5 mg Oral Daily    Infusions:  furosemide (LASIX) 200 mg in dextrose 5 % 100 mL (2 mg/mL) infusion 30 mg/hr (10/10/23 0527)    PRN Medications: acetaminophen, baclofen, ondansetron (ZOFRAN) IV, oxyCODONE-acetaminophen, phenol, sodium chloride, sodium chloride flush  Assessment/Plan:   1. 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 Ratio 1.26 read as equivocal.  SPEP negative - cMRI repeated (11/22): LVEF 43%, RVEF 44%, LGE concerning for sarcoid.  - Echo (4/23): EF 35-40% Moderate RV dysfunction - Has seen geneticist, Dr. Jomarie Longs. Work up c/w LMNA.  - Admitted with NYHA IIIb symptoms and 40 pound weight gain - Echo 11/19 EF 35-40%, LV with GHK, RV mod reduced, mildly elevated PASP, RVSP ~39, trivial MR  - Massively volume overloaded with R>>L HF. CVP 19. - Continue lasix 30/hr. Metolazone 5mg  + Diamox 250 bid today. - Increase Spiro tomorrow if sCr continues to improve for hypokalemia - Continue Jardiance 10 - Has been off b-blocker due to bradycardia and heart block - Off ARNi and ARB (hypotension and N/V in the past) - can rechallenge as able   2. Syncope/VT - 04/23 > secondary to VT - s/p ICD 4/23. - Continue mexiletine 150 mg bid   - Continue amiodarone 200 mg daiy - Followed by Dr. Ladona Ridgel  3. AKI on CKD Stage IIIb - Due to cardiorenal syndrome. Baseline SCr 1.7-2.0. Peaked 3.03 - Improving with diuresis 2.52 today. - Diuresis as above.    4. Chronic AF/AFL - S/p AFL ablation 5/19 with Dr. Ladona Ridgel. - Continue Eliquis 5 mg bid and amiodarone 200 daily  - No role for DC-CV as he has been in AFL for a long time   5.  OSA: Continue  CPAP   6. PVCs/VT - Continue mexiletine + amiodarone. - Followed by EP   7. Obesity - Body mass index is Body mass index is 40.69 kg/m. - Consider GLP1RA  8. Hypokalemia - K 3.1 -  Potassium 60 Q6 + additional supp 60 this am - If sCr continues to improve increase Cleda Daub tomorrow - Repeat BMP this afternoon  9. ID - per primary. WBC peaked at 21.6 with fever. No infective source identified.  - UA (-), PCXR (-), RVP (-), Bcx (-) - Cefepime IV 5 days stopped yesterday - WBC 9.6>13.7 off abx. Continue to follow   10. H/o Gout - R>L knee/leg swelling  - Uric acid 7.0  Length of Stay: 10  [place] LeeMD 10/10/2023, 8:07 AM  Advanced Heart Failure Team Pager [phone] (M-F; 7a - 4p)  Please contact [facility] for night-coverage after hours (4p -7a ) and weekends on [email or web address]

## 2023-10-10 NOTE — Progress Notes (Signed)
PROGRESS NOTE    Bradley Powell  WUJ:[id number] DOB: 1981/08/28 DOA: 09/29/2023 PCP: [name], MD   Brief Narrative:  This 42 year old male with past medical history of paroxysmal atrial flutter s/p remote ablation, chronic combined systolic/diastolic CHF, NICM, OSA and morbid obesity.  He presents to the ER with complaints of increasing fluid retention on his legs up to his stomach.  Per patient his been compliant with his medications.  On lasix gtt directed by CHF team.  Cardiology is following. Plan is  to have DCCV when adequately diuresed.   Assessment & Plan:   Principal Problem:   Acute on chronic congestive heart failure (HCC) Active Problems:   Hepatic cirrhosis (HCC)   Edema   Morbid obesity (HCC)   Gout   NICM (nonischemic cardiomyopathy) (HCC)   Paroxysmal atrial fibrillation (HCC)   Acute on chronic systolic (congestive) heart failure (HCC)   ICD (implantable cardioverter-defibrillator) in place   Fluid overload  Acute on chronic systolic heart failure: Echo >  LVEF 35-40%.  Patient was started on Lasix gtt. as per CHF protocol. Medications adjusted by CHF team. Strict I's and O's, daily weights Cardiology consult appreciated. Down 27L overall and 16lbs. Patient wants to have a break from Lasix.  Stopped as per cardiology. Lasix gtt switched to Lasix 160 mg twice daily plus Diamox. Appears severely volume overloaded,  urine output dropped with intermittent dosing of Lasix. Serum creatinine up to 2.9 with 5 pound weight gain overnight. Lasix infusion resumed. Metolazone added. May require milrinone infusion if urine output not improved. PICC line inserted.  Patient feels improved,  less bloated, urine output increased with Lasix 30 mg/h Remains volume overloaded.  Continue IV diuresis.   Intake/Output Summary (Last 24 hours) at 10/10/2023 1425 Last data filed at 10/10/2023 1303 Gross per 24 hour  Intake 1107.22 ml  Output 5380 ml  Net -4272.78 ml     AKI on CKD stage 4: Serum creatinine remained around baseline. Creatinine 2.46 -> 2.09>2.3>2.0>1.93>2.39>2.63>2.96>2.89 >2.55>2.52   Permanent atrial fibrillation: Continue Amiodarone/ Eliquis. DCCV would be pursued once patient is adequately diuresed.   Hepatic cirrhosis (HCC) Likely secondary to NASH.   Morbid obesity (HCC)// OSA Estimated body mass index is 42.25 kg/m as calculated from the following:   Height as of this encounter: 5\' 8"  (1.727 m).   Weight as of this encounter: 126.1 kg.    Hypokalemia Replace aggressively. -Mg normal  Fever: Completed antibiotics for 5 days for pneumonia. Remains afebrile.  DVT prophylaxis: Eliquis Code Status: Full code Family Communication: No family at bedside Disposition Plan:   Admitted for acute on chronic systolic CHF.  Cardiology is consulted,  requiring IV diuresis. Also found to have A-fib, requiring DCCV once adequately diuresed.  Consultants:  Cardiology  Procedures:  Echocardiogram.  Antimicrobials: Anti-infectives (From admission, onward)    Start     Dose/Rate Route Frequency Ordered Stop   10/06/23 0600  vancomycin (VANCOCIN) IVPB 1000 mg/200 mL premix  Status:  Discontinued        1,000 mg 200 mL/hr over 60 Minutes Intravenous Every 24 hours 10/05/23 0412 10/06/23 1053   10/05/23 1600  ceFEPIme (MAXIPIME) 2 g in sodium chloride 0.9 % 100 mL IVPB  Status:  Discontinued        2 g 200 mL/hr over 30 Minutes Intravenous Every 12 hours 10/05/23 0412 10/09/23 1132   10/05/23 0515  vancomycin (VANCOCIN) IVPB 1000 mg/200 mL premix        1,000 mg 200 mL/hr  over 60 Minutes Intravenous Every 1 hr x 2 10/05/23 0415 10/05/23 0927   10/05/23 0500  metroNIDAZOLE (FLAGYL) IVPB 500 mg  Status:  Discontinued        500 mg 100 mL/hr over 60 Minutes Intravenous Every 12 hours 10/05/23 0402 10/06/23 1053   10/05/23 0500  vancomycin (VANCOCIN) IVPB 1000 mg/200 mL premix  Status:  Discontinued        1,000 mg 200 mL/hr  over 60 Minutes Intravenous Every 1 hr x 2 10/05/23 0412 10/05/23 0414   10/05/23 0500  ceFEPIme (MAXIPIME) 2 g in sodium chloride 0.9 % 100 mL IVPB        2 g 200 mL/hr over 30 Minutes Intravenous  Once 10/05/23 0412 10/05/23 0926   10/05/23 0500  vancomycin (VANCOREADY) IVPB 2000 mg/400 mL  Status:  Discontinued        2,000 mg 200 mL/hr over 120 Minutes Intravenous  Once 10/05/23 0414 10/05/23 0415       Subjective: Patient was seen and examined at bedside.  Overnight events noted.   Patient is morbidly obese, severely deconditioned, remains on supplemental oxygen. Patient resumed on Lasix infusion.  Patient reports less bloated, reports has lost some weight.  Objective: Vitals:   10/10/23 0004 10/10/23 0529 10/10/23 0742 10/10/23 1205  BP: 98/69 108/88  107/73  Pulse: 76 83 79 84  Resp: 17 18 20 18   Temp: 98.3 F (36.8 C) 98.4 F (36.9 C) 98 F (36.7 C) 98.4 F (36.9 C)  TempSrc: Oral Oral Oral Oral  SpO2: 98% 100%  100%  Weight:  121.4 kg    Height:        Intake/Output Summary (Last 24 hours) at 10/10/2023 1425 Last data filed at 10/10/2023 1303 Gross per 24 hour  Intake 1107.22 ml  Output 5380 ml  Net -4272.78 ml   Filed Weights   10/08/23 0500 10/09/23 0640 10/10/23 0529  Weight: 124.1 kg 122.6 kg 121.4 kg    Examination:  General exam: Appears comfortable, deconditioned, not in any acute distress. Respiratory system: CTA bilaterally.  Respiratory effort normal.  RR 15 Cardiovascular system: S1 & S2 heard, irregular rhythm, no murmur . Gastrointestinal system: Abdomen is nondistended, soft and non tender. Normal bowel sounds heard. Central nervous system: Alert and oriented x3. No focal neurological deficits. Extremities: Edema++, no cyanosis, no clubbing Skin: No rashes, lesions or ulcers Psychiatry: Judgement and insight appear normal. Mood & affect appropriate.     Data Reviewed: I have personally reviewed following labs and imaging  studies  CBC: Recent Labs  Lab 10/05/23 0311 10/06/23 0314 10/07/23 0849 10/08/23 0525 10/10/23 0545  WBC 17.9* 15.7* 11.7* 9.6 13.7*  HGB 13.1 13.0 12.3* 13.1 12.9*  HCT 41.1 42.2 38.9* 40.9 39.9  MCV 83.0 84.9 83.5 81.8 82.6  PLT 206 179 204 213 263   Basic Metabolic Panel: Recent Labs  Lab 10/05/23 0311 10/05/23 1934 10/06/23 0314 10/06/23 2049 10/07/23 0849 10/08/23 0525 10/08/23 1241 10/09/23 0436 10/09/23 1417 10/10/23 0545  NA 135   < > 131*   < > 133* 132* 131* 132* 131* 133*  K 2.9*   < > 3.5   < > 3.3* 2.6* 2.9* 2.9* 3.3* 3.1*  CL 91*   < > 91*   < > 97* 91* 89* 90* 91* 93*  CO2 30   < > 29   < > 24 27 27 27 28 28   GLUCOSE 106*   < > 151*   < > 168*  93 92 92 130* 98  BUN 37*   < > 44*   < > 49* 52* 51* 51* 46* 44*  CREATININE 2.47*   < > 2.96*   < > 2.88* 2.92* 2.89* 2.90* 2.55* 2.52*  CALCIUM 8.5*   < > 8.3*   < > 8.6* 8.8* 8.9 8.8* 8.4* 8.6*  MG 1.9  --  2.2  --  2.2 2.4  --   --  2.1 2.2  PHOS 3.2  --   --   --  3.4  --   --   --   --   --    < > = values in this interval not displayed.   GFR: Estimated Creatinine Clearance: 49.4 mL/min (A) (by C-G formula based on SCr of 2.52 mg/dL (H)). Liver Function Tests: Recent Labs  Lab 10/04/23 0400  AST 60*  ALT 43  ALKPHOS 202*  BILITOT 2.8*  PROT 6.6  ALBUMIN 2.5*   No results for input(s): "LIPASE", "AMYLASE" in the last 168 hours. No results for input(s): "AMMONIA" in the last 168 hours. Coagulation Profile: No results for input(s): "INR", "PROTIME" in the last 168 hours. Cardiac Enzymes: No results for input(s): "CKTOTAL", "CKMB", "CKMBINDEX", "TROPONINI" in the last 168 hours. BNP (last 3 results) No results for input(s): "PROBNP" in the last 8760 hours. HbA1C: No results for input(s): "HGBA1C" in the last 72 hours. CBG: No results for input(s): "GLUCAP" in the last 168 hours. Lipid Profile: No results for input(s): "CHOL", "HDL", "LDLCALC", "TRIG", "CHOLHDL", "LDLDIRECT" in the last 72  hours. Thyroid Function Tests: No results for input(s): "TSH", "T4TOTAL", "FREET4", "T3FREE", "THYROIDAB" in the last 72 hours. Anemia Panel: No results for input(s): "VITAMINB12", "FOLATE", "FERRITIN", "TIBC", "IRON", "RETICCTPCT" in the last 72 hours. Sepsis Labs: Recent Labs  Lab 10/05/23 0002  LATICACIDVEN 1.8    Recent Results (from the past 240 hour(s))  Culture, blood (Routine X 2) w Reflex to ID Panel     Status: None   Collection Time: 10/05/23 12:02 AM   Specimen: BLOOD RIGHT ARM  Result Value Ref Range Status   Specimen Description BLOOD RIGHT ARM  Final   Special Requests   Final    BOTTLES DRAWN AEROBIC AND ANAEROBIC Blood Culture results may not be optimal due to an excessive volume of blood received in culture bottles   Culture   Final    NO GROWTH 5 DAYS Performed at [facility], [address], [place], [place] [address]    Report Status 10/10/2023 FINAL  Final  Culture, blood (Routine X 2) w Reflex to ID Panel     Status: None   Collection Time: 10/05/23 12:05 AM   Specimen: BLOOD LEFT ARM  Result Value Ref Range Status   Specimen Description BLOOD LEFT ARM  Final   Special Requests   Final    BOTTLES DRAWN AEROBIC AND ANAEROBIC Blood Culture results may not be optimal due to an excessive volume of blood received in culture bottles   Culture   Final    NO GROWTH 5 DAYS Performed at [facility], [address] [place], [place] [address]    Report Status 10/10/2023 FINAL  Final  Urine Culture (for pregnant, neutropenic or urologic patients or patients with an indwelling urinary catheter)     Status: Abnormal   Collection Time: 10/05/23  5:42 AM   Specimen: Urine, Clean Catch  Result Value Ref Range Status   Specimen Description URINE, CLEAN CATCH  Final   Special Requests NONE  Final   Culture (A)  Final    <10,000 COLONIES/mL INSIGNIFICANT GROWTH Performed at [facility], [address], [place], [place] [address]    Report Status  10/06/2023 FINAL  Final  Respiratory (~20 pathogens) panel by PCR     Status: None   Collection Time: 10/05/23 12:02 PM   Specimen: Nasopharyngeal Swab; Respiratory  Result Value Ref Range Status   Adenovirus NOT DETECTED NOT DETECTED Final   Coronavirus 229E NOT DETECTED NOT DETECTED Final    Comment: (NOTE) The Coronavirus on the Respiratory Panel, DOES NOT test for the novel  Coronavirus (2019 nCoV)    Coronavirus HKU1 NOT DETECTED NOT DETECTED Final   Coronavirus NL63 NOT DETECTED NOT DETECTED Final   Coronavirus OC43 NOT DETECTED NOT DETECTED Final   Metapneumovirus NOT DETECTED NOT DETECTED Final   Rhinovirus / Enterovirus NOT DETECTED NOT DETECTED Final   Influenza A NOT DETECTED NOT DETECTED Final   Influenza B NOT DETECTED NOT DETECTED Final   Parainfluenza Virus 1 NOT DETECTED NOT DETECTED Final   Parainfluenza Virus 2 NOT DETECTED NOT DETECTED Final   Parainfluenza Virus 3 NOT DETECTED NOT DETECTED Final   Parainfluenza Virus 4 NOT DETECTED NOT DETECTED Final   Respiratory Syncytial Virus NOT DETECTED NOT DETECTED Final   Bordetella pertussis NOT DETECTED NOT DETECTED Final   Bordetella Parapertussis NOT DETECTED NOT DETECTED Final   Chlamydophila pneumoniae NOT DETECTED NOT DETECTED Final   Mycoplasma pneumoniae NOT DETECTED NOT DETECTED Final    Comment: Performed at [facility], [address], [place], [place] [address]     Radiology Studies: DG CHEST PORT 1 VIEW  Result Date: 10/09/2023 CLINICAL DATA:  PICC line EXAM: PORTABLE CHEST 1 VIEW COMPARISON:  10/06/2023 FINDINGS: Left-sided pacing device. Right upper extremity central venous catheter tip over the SVC. Borderline to mild cardiomegaly. No acute airspace disease, pleural effusion or pneumothorax IMPRESSION: Right upper extremity central venous catheter tip over the SVC. Electronically Signed   By: Jasmine Pang M.D.   On: 10/09/2023 20:37    Scheduled Meds:  acetaZOLAMIDE  250 mg Oral BID    allopurinol  200 mg Oral Daily   amiodarone  200 mg Oral Daily   apixaban  5 mg Oral BID   Chlorhexidine Gluconate Cloth  6 each Topical Daily   empagliflozin  10 mg Oral Daily   levothyroxine  50 mcg Oral Q0600   mexiletine  150 mg Oral Q12H   pantoprazole  40 mg Oral BID   potassium chloride  60 mEq Oral Q6H   sodium chloride flush  10-40 mL Intracatheter Q12H   spironolactone  12.5 mg Oral Daily   Continuous Infusions:  furosemide (LASIX) 200 mg in dextrose 5 % 100 mL (2 mg/mL) infusion 30 mg/hr (10/10/23 1245)     LOS: 10 days    Time spent: 35 mins    Willeen Niece, MD [facility]   If 7PM-7AM, please contact night-coverage

## 2023-10-11 DIAGNOSIS — I5023 Acute on chronic systolic (congestive) heart failure: Secondary | ICD-10-CM | POA: Diagnosis not present

## 2023-10-11 DIAGNOSIS — I428 Other cardiomyopathies: Secondary | ICD-10-CM | POA: Diagnosis not present

## 2023-10-11 DIAGNOSIS — K7460 Unspecified cirrhosis of liver: Secondary | ICD-10-CM | POA: Diagnosis not present

## 2023-10-11 DIAGNOSIS — E8770 Fluid overload, unspecified: Secondary | ICD-10-CM | POA: Diagnosis not present

## 2023-10-11 DIAGNOSIS — I480 Paroxysmal atrial fibrillation: Secondary | ICD-10-CM | POA: Diagnosis not present

## 2023-10-11 DIAGNOSIS — I5043 Acute on chronic combined systolic (congestive) and diastolic (congestive) heart failure: Secondary | ICD-10-CM | POA: Diagnosis not present

## 2023-10-11 LAB — BASIC METABOLIC PANEL
Anion gap: 14 (ref 5–15)
Anion gap: 12 (ref 5–15)
BUN: 48 mg/dL — ABNORMAL HIGH (ref 6–20)
BUN: 47 mg/dL — ABNORMAL HIGH (ref 6–20)
CO2: 26 mmol/L (ref 22–32)
CO2: 30 mmol/L (ref 22–32)
Calcium: 8.9 mg/dL (ref 8.9–10.3)
Calcium: 8.6 mg/dL — ABNORMAL LOW (ref 8.9–10.3)
Chloride: 92 mmol/L — ABNORMAL LOW (ref 98–111)
Chloride: 89 mmol/L — ABNORMAL LOW (ref 98–111)
Creatinine, Ser: 2.55 mg/dL — ABNORMAL HIGH (ref 0.61–1.24)
Creatinine, Ser: 2.46 mg/dL — ABNORMAL HIGH (ref 0.61–1.24)
GFR, Estimated: 32 mL/min — ABNORMAL LOW (ref >60)
GFR, Estimated: 33 mL/min — ABNORMAL LOW (ref >60)
Glucose, Bld: 103 mg/dL — ABNORMAL HIGH (ref 70–99)
Glucose, Bld: 157 mg/dL — ABNORMAL HIGH (ref 70–99)
Potassium: 3.2 mmol/L — ABNORMAL LOW (ref 3.5–5.1)
Potassium: 3.2 mmol/L — ABNORMAL LOW (ref 3.5–5.1)
Sodium: 132 mmol/L — ABNORMAL LOW (ref 135–145)
Sodium: 131 mmol/L — ABNORMAL LOW (ref 135–145)

## 2023-10-11 LAB — COOXEMETRY PANEL
Carboxyhemoglobin: 1.8 % — ABNORMAL HIGH (ref 0.5–1.5)
Methemoglobin: <0.7 % (ref 0.0–1.5)
O2 Saturation: 75.1 %
Total hemoglobin: 14.1 g/dL (ref 12.0–16.0)

## 2023-10-11 LAB — CBC
HCT: 42.1 % (ref 39.0–52.0)
Hemoglobin: 13.6 g/dL (ref 13.0–17.0)
MCH: 26.7 pg (ref 26.0–34.0)
MCHC: 32.3 g/dL (ref 30.0–36.0)
MCV: 82.7 fL (ref 80.0–100.0)
Platelets: 327 10*3/uL (ref 150–400)
RBC: 5.09 MIL/uL (ref 4.22–5.81)
RDW: 15.9 % — ABNORMAL HIGH (ref 11.5–15.5)
WBC: 11.5 10*3/uL — ABNORMAL HIGH (ref 4.0–10.5)
nRBC: 0.3 % — ABNORMAL HIGH (ref 0.0–0.2)

## 2023-10-11 LAB — PHOSPHORUS: Phosphorus: 3.7 mg/dL (ref 2.5–4.6)

## 2023-10-11 LAB — MAGNESIUM: Magnesium: 2.4 mg/dL (ref 1.7–2.4)

## 2023-10-11 MED ORDER — METOLAZONE 5 MG PO TABS
5.0000 mg | ORAL_TABLET | Freq: Once | ORAL | Status: AC
  Administered 2023-10-11: 5 mg via ORAL
  Filled 2023-10-11: qty 1

## 2023-10-11 MED ORDER — SPIRONOLACTONE 25 MG PO TABS
25.0000 mg | ORAL_TABLET | Freq: Every day | ORAL | Status: DC
  Administered 2023-10-11 – 2023-10-22 (×12): 25 mg via ORAL
  Filled 2023-10-11 (×12): qty 1

## 2023-10-11 MED ORDER — POTASSIUM CHLORIDE CRYS ER 20 MEQ PO TBCR
60.0000 meq | EXTENDED_RELEASE_TABLET | Freq: Every day | ORAL | Status: DC
  Administered 2023-10-11 – 2023-10-14 (×15): 60 meq via ORAL
  Filled 2023-10-11 (×16): qty 3

## 2023-10-11 MED ORDER — POTASSIUM CHLORIDE CRYS ER 20 MEQ PO TBCR
60.0000 meq | EXTENDED_RELEASE_TABLET | Freq: Once | ORAL | Status: AC
  Administered 2023-10-11: 60 meq via ORAL
  Filled 2023-10-11: qty 3

## 2023-10-11 MED ORDER — POTASSIUM CHLORIDE CRYS ER 20 MEQ PO TBCR
40.0000 meq | EXTENDED_RELEASE_TABLET | Freq: Once | ORAL | Status: AC
  Administered 2023-10-11: 40 meq via ORAL
  Filled 2023-10-11: qty 2

## 2023-10-11 NOTE — Hospital Course (Addendum)
Bradley Powell was admitted to the hospital with the working diagnosis of heart failure decompensation.   42 year old male with past medical history of paroxysmal atrial flutter s/p remote ablation, chronic combined systolic/diastolic CHF, NICM, OSA and morbid obesity. He presents to the ER with complaints of increasing fluid retention on his legs up to his stomach. Per patient his been compliant with his medications. On his initial physical examination his blood pressure was 102/80 , HR 87, RR 22 and 02 saturation 100% , lungs with no wheezing or rales, heart with S1 and S2 present, irregularly irregular, with no gallops or rubs, abdomen with no distention but positive abdominal wall edema, positive lower extremity edema.   Chest radiograph with mild cardiomegaly, bilateral hilar vascular congestion and mild cephalization of the vasculature. Positive pacemaker defibrillator in place with one right ventricular lead.   Urine analysis SG 1,008, protein negative, pH 7.0, glucose >500, negative for leukocytes and negative Hgb.   He has now been placed on a lasix gtt for aggressive diuresis.  Plan is to have TEE/DCCV when adequately diuresed.   11/29 continue to have severe volume overload. 12/02 continue volume overloaded.  12/03 continue with aggressive diuresis

## 2023-10-11 NOTE — Plan of Care (Signed)
  Problem: Education: Goal: Knowledge of General Education information will improve Description: Including pain rating scale, medication(s)/side effects and non-pharmacologic comfort measures Outcome: Progressing   Problem: Health Behavior/Discharge Planning: Goal: Ability to manage health-related needs will improve Outcome: Progressing   Problem: Clinical Measurements: Goal: Ability to maintain clinical measurements within normal limits will improve Outcome: Progressing Goal: Will remain free from infection Outcome: Progressing Goal: Diagnostic test results will improve Outcome: Progressing Goal: Cardiovascular complication will be avoided Outcome: Progressing   Problem: Activity: Goal: Risk for activity intolerance will decrease Outcome: Progressing   Problem: Education: Goal: Ability to demonstrate management of disease process will improve Outcome: Progressing Goal: Ability to verbalize understanding of medication therapies will improve Outcome: Progressing Goal: Individualized Educational Video(s) Outcome: Progressing   Problem: Activity: Goal: Capacity to carry out activities will improve Outcome: Progressing   Problem: Cardiac: Goal: Ability to achieve and maintain adequate cardiopulmonary perfusion will improve Outcome: Progressing

## 2023-10-11 NOTE — Plan of Care (Signed)
Plan of care reviewed. Pt is progressing. He is stable hemodynamically, afebrile, on room air, no acute distress overnight. CVP 17-20 mmHg. Afib rate under controlled on the monitor.   Problem: Cardiac: Goal: Ability to achieve and maintain adequate cardiopulmonary perfusion will improve Outcome: Progressing   Problem: Activity: Goal: Capacity to carry out activities will improve Outcome: Progressing   Problem: Education: Goal: Ability to demonstrate management of disease process will improve Outcome: Progressing Goal: Ability to verbalize understanding of medication therapies will improve Outcome: Progressing Goal: Individualized Educational Video(s)Outcome: Progressing   Problem: Clinical Measurements: Goal: Cardiovascular complication will be avoided Outcome: Progressing  Problem: Clinical Measurements: Goal: Diagnostic test results will improve Outcome: Progressing  Problem: Clinical Measurements: Goal: Will remain free from infection Outcome: Progressing   Filiberto Pinks, RN

## 2023-10-11 NOTE — Progress Notes (Signed)
Advanced Heart Failure Rounding Note   Subjective:    Co-ox 75%. 7.5L UOP.  Lasix gtt 30/hr + Metolazone 5 BID+ Diamox 250 BID today. Down 36lb since admission sCr stable 2.55  Tearful this morning. Feeling well. Upset about slow progress and lack of control. No SOB. No CP.   Objective:    Echo 11/19: EF 35-40%, LV with GHK, RV mod reduced, mildly elevated PASP, RVSP ~39, trivial MR   Weight Range:  Vital Signs:   Temp:  [97.5 F (36.4 C)-98.4 F (36.9 C)] 97.9 F (36.6 C) (11/27 0427) Pulse Rate:  [67-84] 70 (11/27 0427) Resp:  [18-20] 18 (11/27 0427) BP: (94-107)/(65-73) 105/72 (11/27 0427) SpO2:  [97 %-100 %] 100 % (11/27 0427) Weight:  [120.2 kg] 120.2 kg (11/27 0427) Last BM Date : 10/10/23  Weight change: Filed Weights   10/09/23 0640 10/10/23 0529 10/11/23 0427  Weight: 122.6 kg 121.4 kg 120.2 kg    Intake/Output:   Intake/Output Summary (Last 24 hours) at 10/11/2023 0930 Last data filed at 10/11/2023 0854 Gross per 24 hour  Intake 4120.47 ml  Output 7450 ml  Net -3329.53 ml     Physical Exam: CVP 19-20 General: Well appearing. No distress on RA HEENT: neck supple.   Cardiac: JVP to midneck. S1 and S2 present. No murmurs or rub. Resp: Lung sounds clear and equal B/L Abdomen: Soft, non-tender, non-distended. + BS. Extremities: Warm and dry. No rash, cyanosis.  3+ edema. +Unna boots Neuro: Alert and oriented x3. Affect pleasant. Moves all extremities without difficulty. Lines/Devices: RUE PICC  Telemetry: Afib/A-Flutter 80-90s (personally reviewed)  Labs: Basic Metabolic Panel: Recent Labs  Lab 10/05/23 0311 10/05/23 1934 10/07/23 0849 10/08/23 0525 10/08/23 1241 10/09/23 0436 10/09/23 1417 10/10/23 0545 10/10/23 1431 10/11/23 0440  NA 135   < > 133* 132*   < > 132* 131* 133* 132* 132*  K 2.9*   < > 3.3* 2.6*   < > 2.9* 3.3* 3.1* 3.8 3.2*  CL 91*   < > 97* 91*   < > 90* 91* 93* 94* 92*  CO2 30   < > 24 27   < > 27 28 28 26 26    GLUCOSE 106*   < > 168* 93   < > 92 130* 98 145* 103*  BUN 37*   < > 49* 52*   < > 51* 46* 44* 44* 48*  CREATININE 2.47*   < > 2.88* 2.92*   < > 2.90* 2.55* 2.52* 2.70* 2.55*  CALCIUM 8.5*   < > 8.6* 8.8*   < > 8.8* 8.4* 8.6* 8.7* 8.9  MG 1.9   < > 2.2 2.4  --   --  2.1 2.2  --  2.4  PHOS 3.2  --  3.4  --   --   --   --   --   --  3.7   < > = values in this interval not displayed.   Liver Function Tests: No results for input(s): "AST", "ALT", "ALKPHOS", "BILITOT", "PROT", "ALBUMIN" in the last 168 hours.  No results for input(s): "LIPASE", "AMYLASE" in the last 168 hours. No results for input(s): "AMMONIA" in the last 168 hours.  CBC: Recent Labs  Lab 10/06/23 0314 10/07/23 0849 10/08/23 0525 10/10/23 0545 10/11/23 0440  WBC 15.7* 11.7* 9.6 13.7* 11.5*  HGB 13.0 12.3* 13.1 12.9* 13.6  HCT 42.2 38.9* 40.9 39.9 42.1  MCV 84.9 83.5 81.8 82.6 82.7  PLT 179 204 213 263  327   Cardiac Enzymes: No results for input(s): "CKTOTAL", "CKMB", "CKMBINDEX", "TROPONINI" in the last 168 hours.  BNP: BNP (last 3 results) Recent Labs    04/18/23 1610 05/23/23 1618 09/30/23 0513  BNP 171.7* 209.9* 222.1*   ProBNP (last 3 results) No results for input(s): "PROBNP" in the last 8760 hours.  Imaging: DG CHEST PORT 1 VIEW  Result Date: 10/09/2023 CLINICAL DATA:  PICC line EXAM: PORTABLE CHEST 1 VIEW COMPARISON:  10/06/2023 FINDINGS: Left-sided pacing device. Right upper extremity central venous catheter tip over the SVC. Borderline to mild cardiomegaly. No acute airspace disease, pleural effusion or pneumothorax IMPRESSION: Right upper extremity central venous catheter tip over the SVC. Electronically Signed   By: Jasmine Pang M.D.   On: 10/09/2023 20:37    Medications:    Scheduled Medications:  acetaZOLAMIDE  250 mg Oral BID   allopurinol  200 mg Oral Daily   amiodarone  200 mg Oral Daily   apixaban  5 mg Oral BID   Chlorhexidine Gluconate Cloth  6 each Topical Daily    empagliflozin  10 mg Oral Daily   levothyroxine  50 mcg Oral Q0600   mexiletine  150 mg Oral Q12H   pantoprazole  40 mg Oral BID   potassium chloride  40 mEq Oral Once   potassium chloride  60 mEq Oral Q6H   sodium chloride flush  10-40 mL Intracatheter Q12H   spironolactone  12.5 mg Oral Daily    Infusions:  furosemide (LASIX) 200 mg in dextrose 5 % 100 mL (2 mg/mL) infusion 30 mg/hr (10/11/23 0255)    PRN Medications: acetaminophen, baclofen, ondansetron (ZOFRAN) IV, oxyCODONE-acetaminophen, phenol, sodium chloride, sodium chloride flush  Assessment/Plan:   1. 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 Ratio 1.26 read as equivocal.  SPEP negative - cMRI repeated (11/22): LVEF 43%, RVEF 44%, LGE concerning for sarcoid.  - Echo (4/23): EF 35-40% Moderate RV dysfunction - Has seen geneticist, Dr. Jomarie Longs. Work up c/w LMNA.  - Admitted with NYHA IIIb symptoms and 40 pound weight gain - Echo 11/19 EF 35-40%, LV with GHK, RV mod reduced, mildly elevated PASP, RVSP ~39, trivial MR  - Remains massively volume overloaded with R>>L HF. Slow progress. CVP 19. - Continue lasix 30/hr. Metolazone 5mg  + Diamox 250 bid today. - Increase Spiro to help with hypokalemia given stable sCr.  - Continue Jardiance 10 - Has been off b-blocker due to bradycardia and heart block - Off ARNi and ARB (hypotension and N/V in the past) - can rechallenge as able   2. Syncope/VT - 04/23 > secondary to VT - s/p ICD 4/23. - Continue mexiletine 150 mg bid   - Continue amiodarone 200 mg daiy - Followed by Dr. Ladona Ridgel  3. AKI on CKD Stage IIIb - Due to cardiorenal syndrome. Baseline SCr 1.7-2.0. Peaked 3.03 -  Overall improving with diuresis. Stable 2.55 today. - Diuresis as above.    4. Chronic AF/AFL - S/p AFL ablation 5/19 with Dr. Ladona Ridgel. - Continue Eliquis 5 mg bid and amiodarone 200 daily  - No role for DC-CV as he  has been in AFL for a long time   5.  OSA: Continue CPAP   6. PVCs/VT - Continue mexiletine + amiodarone. - Followed by EP   7. Obesity - Body mass index is Body mass index is 40.31 kg/m. - Consider GLP1RA  8. Hypokalemia - K  3.2 - Potassium 60 Q6 + additional supp 40 this am - Increase Spiro as above - Repeat BMP this afternoon  9. ID - per primary. WBC peaked at 21.6 with fever. No infective source identified.  - UA (-), PCXR (-), RVP (-), Bcx (-) - Cefepime IV 5 days stopped yesterday - WBC 9.6>13.7>11.5 off abx. Afebrile.    10. H/o Gout - R>L knee/leg swelling  - Uric acid 7.0  Length of Stay: 11  [place] LeeMD 10/11/2023, 9:30 AM  Advanced Heart Failure Team Pager [phone] (M-F; 7a - 4p)  Please contact [facility] for night-coverage after hours (4p -7a ) and weekends on [email or web address]

## 2023-10-11 NOTE — Progress Notes (Signed)
PROGRESS NOTE    Bradley Powell  ZOX:[id number] DOB: 12/05/1980 DOA: 09/29/2023 PCP: [name], MD   Brief Narrative:  This 42 year old male with past medical history of paroxysmal atrial flutter s/p remote ablation, chronic combined systolic/diastolic CHF, NICM, OSA and morbid obesity. He presents to the ER with complaints of increasing fluid retention on his legs up to his stomach. Per patient his been compliant with his medications. He has now been placed on a lasix gtt directed by CHF team. Cardiology is following. Plan is to have DCCV when adequately diuresed.   Assessment and Plan:  Acute on Chronic Systolic Heart Failure: -Echo >  LVEF 35-40%.  -Patient was started on Lasix gtt as per CHF protocol and is now on 30 mg/hr -Medications adjusted by CHF team. -Strict I's and O's, daily weights  Intake/Output Summary (Last 24 hours) at 10/11/2023 1248 Last data filed at 10/11/2023 0854 Gross per 24 hour  Intake 4120.47 ml  Output 7450 ml  Net -3329.53 ml   Filed Weights   10/09/23 0640 10/10/23 0529 10/11/23 0427  Weight: 122.6 kg 121.4 kg 120.2 kg   -Patient had wanted a break from Lasix gtt this hospitalization so Lasix gtt switched to Lasix 160 mg twice daily plus Diamox but he continued to be severely volume overloaded,  urine output dropped with intermittent dosing of Lasix. -Given that he had worsening Serum creatinine up to 2.9 with 5 pound weight gain his Lasix infusion resumed.  -Metolazone added.  -May require milrinone infusion if urine output not improved. PICC line inserted.  Patient feels improved,  less bloated, urine output increased with Lasix 30 mg/h -Remains volume overloaded.  Continue IV diuresis per Cardiology  -WBC Trend: Recent Labs  Lab 10/05/23 0002 10/05/23 0311 10/06/23 0314 10/07/23 0849 10/08/23 0525 10/10/23 0545 10/11/23 0440  WBC 21.6* 17.9* 15.7* 11.7* 9.6 13.7* 11.5*  -C/w Empagliflozin 10 mg po Daily and Spironolactone 25 mg  po Daily -CXR on 10/09/23 done and showed "Left-sided pacing device. Right upper extremity central venous catheter tip over the SVC. Borderline to mild cardiomegaly. No acute airspace disease, pleural effusion or pneumothorax."    AKI on CKD stage 4: -Baseline Cr of 1.7-2.0. -BUN/Cr Trend: Recent Labs  Lab 10/08/23 0525 10/08/23 1241 10/09/23 0436 10/09/23 1417 10/10/23 0545 10/10/23 1431 10/11/23 0440  BUN 52* 51* 51* 46* 44* 44* 48*  CREATININE 2.92* 2.89* 2.90* 2.55* 2.52* 2.70* 2.55*  -Avoid Nephrotoxic Medications, Contrast Dyes, Hypotension and Dehydration to Ensure Adequate Renal Perfusion and will need to Renally Adjust Meds -Continue to Monitor and Trend Renal Function carefully and repeat CMP in the AM    Permanent Atrial Fibrillation/Atrial Flutter  -Continue to Monitor on Cardiac Telemetry -C/w po Amiodarone 200 mg po Daily and Anticoagulation with Apixaban 5 mg po BID. -DCCV would be pursued once patient is adequately diuresed. -C/w Mexiletine 150 mg po q12h   Hepatic Cirrhosis (HCC) -Likely secondary to NASH. -Check LFTs in the AM   Knee an Ankle Pain and Hx of Gout -C/w Baclofen 10 mg po BID prn Muscle Spasms, Oxycodone-Acetaminophen 1 tab po q8h prn Severe pain  And po Acetaminophen 650 mg po q6hprn Mild Pain  -C/w Allopurinol 200 mg po Daily     Fever in the setting of PNA -Completed antibiotics for 5 days for pneumonia. -WBC Trend:  Recent Labs  Lab 10/05/23 0002 10/05/23 0311 10/06/23 0314 10/07/23 0849 10/08/23 0525 10/10/23 0545 10/11/23 0440  WBC 21.6* 17.9* 15.7* 11.7* 9.6 13.7* 11.5*  -  Remains afebrile. -Repeat CXR in the AM   Normocytic Anemia -Hgb/Hct Trend: Recent Labs  Lab 10/05/23 0002 10/05/23 0311 10/06/23 0314 10/07/23 0849 10/08/23 0525 10/10/23 0545 10/11/23 0440  HGB 12.7* 13.1 13.0 12.3* 13.1 12.9* 13.6  HCT 38.6* 41.1 42.2 38.9* 40.9 39.9 42.1  MCV 81.8 83.0 84.9 83.5 81.8 82.6 82.7  -Check Anemia Panel in the  AM -Continue to Monitor for S/Sx of Bleeding; No overt bleeding noted -Repeat CBC in the AM   Hypokalemia -Patient's K+ Level Trend: Recent Labs  Lab 10/08/23 0525 10/08/23 1241 10/09/23 0436 10/09/23 1417 10/10/23 0545 10/10/23 1431 10/11/23 0440  K 2.6* 2.9* 2.9* 3.3* 3.1* 3.8 3.2*  -Currently being replete with po Kcl 40 mEQ x1 and po KCL 60 mEQ q6h -Continue to Monitor and Replete as Necessary -Repeat CMP in the AM   GERD/GI Prophylaxis -C/w Pantoprazole 40 mg po BID  Morbid Obesity -Complicates overall prognosis and care -Estimated body mass index is 40.31 kg/m as calculated from the following:   Height as of this encounter: 5\' 8"  (1.727 m).   Weight as of this encounter: 120.2 kg.  -Weight Loss and Dietary Counseling given   DVT prophylaxis:  apixaban (ELIQUIS) tablet 5 mg    Code Status: Full Code Family Communication: No family present at bedside   Disposition Plan:  Level of care: Telemetry Cardiac Status is: Inpatient Remains inpatient appropriate because: needs further clinical improvement and diuresis    Consultants:  Cardiology Heart Failure Team  Procedures:  ECHOCARDIOGRAM IMPRESSIONS     1. Left ventricular ejection fraction, by estimation, is 35 to 40%. The  left ventricle has moderately decreased function. The left ventricle  demonstrates global hypokinesis. Left ventricular diastolic parameters are  indeterminate.   2. Right ventricular systolic function is moderately reduced. The right  ventricular size is mildly enlarged. There is mildly elevated pulmonary  artery systolic pressure. The estimated right ventricular systolic  pressure is 39.0 mmHg.   3. The mitral valve is normal in structure. Trivial mitral valve  regurgitation. No evidence of mitral stenosis.   4. The aortic valve is tricuspid. Aortic valve regurgitation is not  visualized. Aortic valve sclerosis is present, with no evidence of aortic  valve stenosis. Aortic valve Vmax  measures 1.10 m/s.   5. The inferior vena cava is dilated in size with <50% respiratory  variability, suggesting right atrial pressure of 15 mmHg.   FINDINGS   Left Ventricle: Left ventricular ejection fraction, by estimation, is 35  to 40%. The left ventricle has moderately decreased function. The left  ventricle demonstrates global hypokinesis. Definity contrast agent was  given IV to delineate the left  ventricular endocardial borders. The left ventricular internal cavity size  was normal in size. There is no left ventricular hypertrophy. Left  ventricular diastolic parameters are indeterminate. Normal left  ventricular filling pressure.   Right Ventricle: The right ventricular size is mildly enlarged. No  increase in right ventricular wall thickness. Right ventricular systolic  function is moderately reduced. There is mildly elevated pulmonary artery  systolic pressure. The tricuspid  regurgitant velocity is 2.45 m/s, and with an assumed right atrial  pressure of 15 mmHg, the estimated right ventricular systolic pressure is  39.0 mmHg.   Left Atrium: Left atrial size was normal in size.   Right Atrium: Right atrial size was normal in size.   Pericardium: There is no evidence of pericardial effusion.   Mitral Valve: The mitral valve is normal in structure. Trivial  mitral  valve regurgitation. No evidence of mitral valve stenosis.   Tricuspid Valve: The tricuspid valve is normal in structure. Tricuspid  valve regurgitation is trivial. No evidence of tricuspid stenosis.   Aortic Valve: The aortic valve is tricuspid. Aortic valve regurgitation is  not visualized. Aortic valve sclerosis is present, with no evidence of  aortic valve stenosis. Aortic valve peak gradient measures 4.8 mmHg.   Pulmonic Valve: The pulmonic valve was normal in structure. Pulmonic valve  regurgitation is not visualized. No evidence of pulmonic stenosis.   Aorta: The aortic root is normal in size and  structure.   Venous: The inferior vena cava is dilated in size with less than 50%  respiratory variability, suggesting right atrial pressure of 15 mmHg.   IAS/Shunts: No atrial level shunt detected by color flow Doppler.   Additional Comments: A device lead is visualized.     LEFT VENTRICLE  PLAX 2D  LVIDd:         4.90 cm   Diastology  LVIDs:         3.80 cm   LV e' medial:    7.07 cm/s  LV PW:         1.00 cm   LV E/e' medial:  13.3  LV IVS:        1.00 cm   LV e' lateral:   10.90 cm/s  LVOT diam:     1.80 cm   LV E/e' lateral: 8.6  LV SV:         47  LV SV Index:   20  LVOT Area:     2.54 cm     RIGHT VENTRICLE            IVC  RV S prime:     9.79 cm/s  IVC diam: 3.40 cm  TAPSE (M-mode): 1.6 cm   LEFT ATRIUM             Index        RIGHT ATRIUM           Index  LA diam:        5.10 cm 2.17 cm/m   RA Area:     24.50 cm  LA Vol (A2C):   57.7 ml 24.55 ml/m  RA Volume:   71.70 ml  30.51 ml/m  LA Vol (A4C):   76.1 ml 32.38 ml/m  LA Biplane Vol: 67.5 ml 28.72 ml/m   AORTIC VALVE  AV Area (Vmax): 2.41 cm  AV Vmax:        110.00 cm/s  AV Peak Grad:   4.8 mmHg  LVOT Vmax:      104.33 cm/s  LVOT Vmean:     69.033 cm/s  LVOT VTI:       0.184 m    AORTA  Ao Root diam: 2.60 cm  Ao Asc diam:  2.50 cm   MITRAL VALVE               TRICUSPID VALVE  MV Area (PHT): 5.84 cm    TR Peak grad:   24.0 mmHg  MV Decel Time: 130 msec    TR Vmax:        245.00 cm/s  MR Peak grad: 29.2 mmHg  MR Vmax:      270.00 cm/s  SHUNTS  MV E velocity: 94.00 cm/s  Systemic VTI:  0.18 m  MV A velocity: 49.20 cm/s  Systemic Diam: 1.80 cm  MV E/A ratio:  1.91   Antimicrobials:  Anti-infectives (From admission, onward)    Start     Dose/Rate Route Frequency Ordered Stop   10/06/23 0600  vancomycin (VANCOCIN) IVPB 1000 mg/200 mL premix  Status:  Discontinued        1,000 mg 200 mL/hr over 60 Minutes Intravenous Every 24 hours 10/05/23 0412 10/06/23 1053   10/05/23 1600  ceFEPIme (MAXIPIME)  2 g in sodium chloride 0.9 % 100 mL IVPB  Status:  Discontinued        2 g 200 mL/hr over 30 Minutes Intravenous Every 12 hours 10/05/23 0412 10/09/23 1132   10/05/23 0515  vancomycin (VANCOCIN) IVPB 1000 mg/200 mL premix        1,000 mg 200 mL/hr over 60 Minutes Intravenous Every 1 hr x 2 10/05/23 0415 10/05/23 0927   10/05/23 0500  metroNIDAZOLE (FLAGYL) IVPB 500 mg  Status:  Discontinued        500 mg 100 mL/hr over 60 Minutes Intravenous Every 12 hours 10/05/23 0402 10/06/23 1053   10/05/23 0500  vancomycin (VANCOCIN) IVPB 1000 mg/200 mL premix  Status:  Discontinued        1,000 mg 200 mL/hr over 60 Minutes Intravenous Every 1 hr x 2 10/05/23 0412 10/05/23 0414   10/05/23 0500  ceFEPIme (MAXIPIME) 2 g in sodium chloride 0.9 % 100 mL IVPB        2 g 200 mL/hr over 30 Minutes Intravenous  Once 10/05/23 0412 10/05/23 0926   10/05/23 0500  vancomycin (VANCOREADY) IVPB 2000 mg/400 mL  Status:  Discontinued        2,000 mg 200 mL/hr over 120 Minutes Intravenous  Once 10/05/23 0414 10/05/23 0415       Subjective: Seen and examined at bedside and he was feeling a little frustrated.  No nausea or vomiting.  Continues to diurese.  Feels little bit better since coming in.  No other concerns or complaints at this time.  Objective: Vitals:   10/11/23 0038 10/11/23 0427 10/11/23 0927 10/11/23 1125  BP: 94/65 105/72    Pulse: 67 70 77   Resp: 20 18 16 18   Temp: 97.6 F (36.4 C) 97.9 F (36.6 C) 98.3 F (36.8 C) (!) 97.5 F (36.4 C)  TempSrc: Oral Oral Oral Oral  SpO2: 97% 100% 100%   Weight:  120.2 kg    Height:        Intake/Output Summary (Last 24 hours) at 10/11/2023 1325 Last data filed at 10/11/2023 0854 Gross per 24 hour  Intake 3583.47 ml  Output 7450 ml  Net -3866.53 ml   Filed Weights   10/09/23 0640 10/10/23 0529 10/11/23 0427  Weight: 122.6 kg 121.4 kg 120.2 kg   Examination: Physical Exam:  Constitutional: WN/WD morbidly obese African-American male who appears  depressed Respiratory: Diminished to auscultation bilaterally, no wheezing, rales, rhonchi or crackles. Normal respiratory effort and patient is not tachypenic. No accessory muscle use.  Cardiovascular: Irregularly irregular, no murmurs / rubs / gallops. S1 and S2 auscultated.  1-2+ lower extremity edema Abdomen: Soft, non-tender, distended secondary to body habitus. Bowel sounds positive.  GU: Deferred. Musculoskeletal: No clubbing / cyanosis of digits/nails. No joint deformity upper and lower extremities.  Skin: No rashes, lesions, ulcers on limited skin evaluation. No induration; Warm and dry.  Neurologic: CN 2-12 grossly intact with no focal deficits. Romberg sign and cerebellar reflexes not assessed.  Psychiatric: Normal judgment and insight. Alert and oriented x 3. Normal mood and appropriate affect.   Data Reviewed: I have personally reviewed  following labs and imaging studies  CBC: Recent Labs  Lab 10/06/23 0314 10/07/23 0849 10/08/23 0525 10/10/23 0545 10/11/23 0440  WBC 15.7* 11.7* 9.6 13.7* 11.5*  HGB 13.0 12.3* 13.1 12.9* 13.6  HCT 42.2 38.9* 40.9 39.9 42.1  MCV 84.9 83.5 81.8 82.6 82.7  PLT 179 204 213 263 327   Basic Metabolic Panel: Recent Labs  Lab 10/05/23 0311 10/05/23 1934 10/07/23 0849 10/08/23 0525 10/08/23 1241 10/09/23 0436 10/09/23 1417 10/10/23 0545 10/10/23 1431 10/11/23 0440  NA 135   < > 133* 132*   < > 132* 131* 133* 132* 132*  K 2.9*   < > 3.3* 2.6*   < > 2.9* 3.3* 3.1* 3.8 3.2*  CL 91*   < > 97* 91*   < > 90* 91* 93* 94* 92*  CO2 30   < > 24 27   < > 27 28 28 26 26   GLUCOSE 106*   < > 168* 93   < > 92 130* 98 145* 103*  BUN 37*   < > 49* 52*   < > 51* 46* 44* 44* 48*  CREATININE 2.47*   < > 2.88* 2.92*   < > 2.90* 2.55* 2.52* 2.70* 2.55*  CALCIUM 8.5*   < > 8.6* 8.8*   < > 8.8* 8.4* 8.6* 8.7* 8.9  MG 1.9   < > 2.2 2.4  --   --  2.1 2.2  --  2.4  PHOS 3.2  --  3.4  --   --   --   --   --   --  3.7   < > = values in this interval not  displayed.   GFR: Estimated Creatinine Clearance: 48.5 mL/min (A) (by C-G formula based on SCr of 2.55 mg/dL (H)). Liver Function Tests: No results for input(s): "AST", "ALT", "ALKPHOS", "BILITOT", "PROT", "ALBUMIN" in the last 168 hours. No results for input(s): "LIPASE", "AMYLASE" in the last 168 hours. No results for input(s): "AMMONIA" in the last 168 hours. Coagulation Profile: No results for input(s): "INR", "PROTIME" in the last 168 hours. Cardiac Enzymes: No results for input(s): "CKTOTAL", "CKMB", "CKMBINDEX", "TROPONINI" in the last 168 hours. BNP (last 3 results) No results for input(s): "PROBNP" in the last 8760 hours. HbA1C: No results for input(s): "HGBA1C" in the last 72 hours. CBG: No results for input(s): "GLUCAP" in the last 168 hours. Lipid Profile: No results for input(s): "CHOL", "HDL", "LDLCALC", "TRIG", "CHOLHDL", "LDLDIRECT" in the last 72 hours. Thyroid Function Tests: No results for input(s): "TSH", "T4TOTAL", "FREET4", "T3FREE", "THYROIDAB" in the last 72 hours. Anemia Panel: No results for input(s): "VITAMINB12", "FOLATE", "FERRITIN", "TIBC", "IRON", "RETICCTPCT" in the last 72 hours. Sepsis Labs: Recent Labs  Lab 10/05/23 0002  LATICACIDVEN 1.8   Recent Results (from the past 240 hour(s))  Culture, blood (Routine X 2) w Reflex to ID Panel     Status: None   Collection Time: 10/05/23 12:02 AM   Specimen: BLOOD RIGHT ARM  Result Value Ref Range Status   Specimen Description BLOOD RIGHT ARM  Final   Special Requests   Final    BOTTLES DRAWN AEROBIC AND ANAEROBIC Blood Culture results may not be optimal due to an excessive volume of blood received in culture bottles   Culture   Final    NO GROWTH 5 DAYS Performed at [facility], [address], [place], [place] [address]    Report Status 10/10/2023 FINAL  Final  Culture, blood (Routine X 2)  w Reflex to ID Panel     Status: None   Collection Time: 10/05/23 12:05 AM   Specimen: BLOOD LEFT ARM   Result Value Ref Range Status   Specimen Description BLOOD LEFT ARM  Final   Special Requests   Final    BOTTLES DRAWN AEROBIC AND ANAEROBIC Blood Culture results may not be optimal due to an excessive volume of blood received in culture bottles   Culture   Final    NO GROWTH 5 DAYS Performed at [facility], [address], [place], [place] [address]    Report Status 10/10/2023 FINAL  Final  Urine Culture (for pregnant, neutropenic or urologic patients or patients with an indwelling urinary catheter)     Status: Abnormal   Collection Time: 10/05/23  5:42 AM   Specimen: Urine, Clean Catch  Result Value Ref Range Status   Specimen Description URINE, CLEAN CATCH  Final   Special Requests NONE  Final   Culture (A)  Final    <10,000 COLONIES/mL INSIGNIFICANT GROWTH Performed at [facility], [address], [place], [place] [address]    Report Status 10/06/2023 FINAL  Final  Respiratory (~20 pathogens) panel by PCR     Status: None   Collection Time: 10/05/23 12:02 PM   Specimen: Nasopharyngeal Swab; Respiratory  Result Value Ref Range Status   Adenovirus NOT DETECTED NOT DETECTED Final   Coronavirus 229E NOT DETECTED NOT DETECTED Final    Comment: (NOTE) The Coronavirus on the Respiratory Panel, DOES NOT test for the novel  Coronavirus (2019 nCoV)    Coronavirus HKU1 NOT DETECTED NOT DETECTED Final   Coronavirus NL63 NOT DETECTED NOT DETECTED Final   Coronavirus OC43 NOT DETECTED NOT DETECTED Final   Metapneumovirus NOT DETECTED NOT DETECTED Final   Rhinovirus / Enterovirus NOT DETECTED NOT DETECTED Final   Influenza A NOT DETECTED NOT DETECTED Final   Influenza B NOT DETECTED NOT DETECTED Final   Parainfluenza Virus 1 NOT DETECTED NOT DETECTED Final   Parainfluenza Virus 2 NOT DETECTED NOT DETECTED Final   Parainfluenza Virus 3 NOT DETECTED NOT DETECTED Final   Parainfluenza Virus 4 NOT DETECTED NOT DETECTED Final   Respiratory Syncytial Virus NOT DETECTED NOT  DETECTED Final   Bordetella pertussis NOT DETECTED NOT DETECTED Final   Bordetella Parapertussis NOT DETECTED NOT DETECTED Final   Chlamydophila pneumoniae NOT DETECTED NOT DETECTED Final   Mycoplasma pneumoniae NOT DETECTED NOT DETECTED Final    Comment: Performed at [facility], [address] [place], [place] [address]    Radiology Studies: DG CHEST PORT 1 VIEW  Result Date: 10/09/2023 CLINICAL DATA:  PICC line EXAM: PORTABLE CHEST 1 VIEW COMPARISON:  10/06/2023 FINDINGS: Left-sided pacing device. Right upper extremity central venous catheter tip over the SVC. Borderline to mild cardiomegaly. No acute airspace disease, pleural effusion or pneumothorax IMPRESSION: Right upper extremity central venous catheter tip over the SVC. Electronically Signed   By: Jasmine Pang M.D.   On: 10/09/2023 20:37    Scheduled Meds:  acetaZOLAMIDE  250 mg Oral BID   allopurinol  200 mg Oral Daily   amiodarone  200 mg Oral Daily   apixaban  5 mg Oral BID   Chlorhexidine Gluconate Cloth  6 each Topical Daily   empagliflozin  10 mg Oral Daily   levothyroxine  50 mcg Oral Q0600   mexiletine  150 mg Oral Q12H   pantoprazole  40 mg Oral BID   potassium chloride  60 mEq Oral Q6H   sodium chloride flush  10-40 mL Intracatheter Q12H   spironolactone  25 mg Oral Daily   Continuous Infusions:  furosemide (LASIX) 200 mg in dextrose 5 % 100 mL (2 mg/mL) infusion 30 mg/hr (10/11/23 0934)    LOS: 11 days   Marguerita Merles, DO [facility] Available via Epic secure chat 7am-7pm After these hours, please refer to coverage provider listed on [email or web address] 10/11/2023, 1:25 PM

## 2023-10-12 DIAGNOSIS — E8770 Fluid overload, unspecified: Secondary | ICD-10-CM | POA: Diagnosis not present

## 2023-10-12 DIAGNOSIS — I480 Paroxysmal atrial fibrillation: Secondary | ICD-10-CM

## 2023-10-12 DIAGNOSIS — K7460 Unspecified cirrhosis of liver: Secondary | ICD-10-CM | POA: Diagnosis not present

## 2023-10-12 DIAGNOSIS — I5023 Acute on chronic systolic (congestive) heart failure: Secondary | ICD-10-CM | POA: Diagnosis not present

## 2023-10-12 DIAGNOSIS — I428 Other cardiomyopathies: Secondary | ICD-10-CM | POA: Diagnosis not present

## 2023-10-12 LAB — CBC WITH DIFFERENTIAL/PLATELET
Abs Immature Granulocytes: 0 10*3/uL (ref 0.00–0.07)
Basophils Absolute: 0.2 10*3/uL — ABNORMAL HIGH (ref 0.0–0.1)
Basophils Relative: 2 %
Eosinophils Absolute: 0 10*3/uL (ref 0.0–0.5)
Eosinophils Relative: 0 %
HCT: 40.8 % (ref 39.0–52.0)
Hemoglobin: 13.1 g/dL (ref 13.0–17.0)
Lymphocytes Relative: 10 %
Lymphs Abs: 0.9 10*3/uL (ref 0.7–4.0)
MCH: 26.1 pg (ref 26.0–34.0)
MCHC: 32.1 g/dL (ref 30.0–36.0)
MCV: 81.4 fL (ref 80.0–100.0)
Monocytes Absolute: 1.4 10*3/uL — ABNORMAL HIGH (ref 0.1–1.0)
Monocytes Relative: 15 %
Neutro Abs: 6.6 10*3/uL (ref 1.7–7.7)
Neutrophils Relative %: 73 %
Platelets: 329 10*3/uL (ref 150–400)
RBC: 5.01 MIL/uL (ref 4.22–5.81)
RDW: 15.9 % — ABNORMAL HIGH (ref 11.5–15.5)
WBC: 9.1 10*3/uL (ref 4.0–10.5)
nRBC: 0 /100{WBCs}
nRBC: 0.3 % — ABNORMAL HIGH (ref 0.0–0.2)

## 2023-10-12 LAB — COMPREHENSIVE METABOLIC PANEL
ALT: 38 U/L (ref 0–44)
AST: 33 U/L (ref 15–41)
Albumin: 2.3 g/dL — ABNORMAL LOW (ref 3.5–5.0)
Alkaline Phosphatase: 267 U/L — ABNORMAL HIGH (ref 38–126)
Anion gap: 13 (ref 5–15)
BUN: 47 mg/dL — ABNORMAL HIGH (ref 6–20)
CO2: 27 mmol/L (ref 22–32)
Calcium: 9 mg/dL (ref 8.9–10.3)
Chloride: 89 mmol/L — ABNORMAL LOW (ref 98–111)
Creatinine, Ser: 2.59 mg/dL — ABNORMAL HIGH (ref 0.61–1.24)
GFR, Estimated: 31 mL/min — ABNORMAL LOW (ref >60)
Glucose, Bld: 117 mg/dL — ABNORMAL HIGH (ref 70–99)
Potassium: 3.2 mmol/L — ABNORMAL LOW (ref 3.5–5.1)
Sodium: 129 mmol/L — ABNORMAL LOW (ref 135–145)
Total Bilirubin: 2.5 mg/dL — ABNORMAL HIGH (ref <1.2)
Total Protein: 7.2 g/dL (ref 6.5–8.1)

## 2023-10-12 LAB — COOXEMETRY PANEL
Carboxyhemoglobin: 1.8 % — ABNORMAL HIGH (ref 0.5–1.5)
Methemoglobin: <0.7 % (ref 0.0–1.5)
O2 Saturation: 81.5 %
Total hemoglobin: 13.7 g/dL (ref 12.0–16.0)

## 2023-10-12 LAB — PHOSPHORUS: Phosphorus: 3.8 mg/dL (ref 2.5–4.6)

## 2023-10-12 LAB — MAGNESIUM: Magnesium: 2.5 mg/dL — ABNORMAL HIGH (ref 1.7–2.4)

## 2023-10-12 MED ORDER — POTASSIUM CHLORIDE CRYS ER 20 MEQ PO TBCR
40.0000 meq | EXTENDED_RELEASE_TABLET | Freq: Once | ORAL | Status: AC
  Administered 2023-10-12: 40 meq via ORAL
  Filled 2023-10-12: qty 2

## 2023-10-12 NOTE — Plan of Care (Signed)
  Problem: Education: Goal: Knowledge of General Education information will improve Description: Including pain rating scale, medication(s)/side effects and non-pharmacologic comfort measures Outcome: Progressing   Problem: Health Behavior/Discharge Planning: Goal: Ability to manage health-related needs will improve Outcome: Progressing   Problem: Clinical Measurements: Goal: Ability to maintain clinical measurements within normal limits will improve Outcome: Progressing Goal: Will remain free from infection Outcome: Progressing Goal: Diagnostic test results will improve Outcome: Progressing Goal: Cardiovascular complication will be avoided Outcome: Progressing   Problem: Activity: Goal: Risk for activity intolerance will decrease Outcome: Progressing   Problem: Education: Goal: Ability to demonstrate management of disease process will improve Outcome: Progressing Goal: Ability to verbalize understanding of medication therapies will improve Outcome: Progressing Goal: Individualized Educational Video(s) Outcome: Progressing   Problem: Activity: Goal: Capacity to carry out activities will improve Outcome: Progressing   Problem: Cardiac: Goal: Ability to achieve and maintain adequate cardiopulmonary perfusion will improve Outcome: Progressing

## 2023-10-12 NOTE — Progress Notes (Addendum)
Progress Note  Patient Name: Bradley Powell Date of Encounter: 10/12/2023  Primary Cardiologist: Dr. Dorthea Cove  Subjective   Dyspnea improved. Still not to baseline.   Inpatient Medications    Scheduled Meds:  acetaZOLAMIDE  250 mg Oral BID   allopurinol  200 mg Oral Daily   amiodarone  200 mg Oral Daily   apixaban  5 mg Oral BID   Chlorhexidine Gluconate Cloth  6 each Topical Daily   empagliflozin  10 mg Oral Daily   levothyroxine  50 mcg Oral Q0600   mexiletine  150 mg Oral Q12H   pantoprazole  40 mg Oral BID   potassium chloride  60 mEq Oral 6 X Daily   sodium chloride flush  10-40 mL Intracatheter Q12H   spironolactone  25 mg Oral Daily   Continuous Infusions:  furosemide (LASIX) 200 mg in dextrose 5 % 100 mL (2 mg/mL) infusion 30 mg/hr (10/12/23 0750)   PRN Meds: acetaminophen, baclofen, ondansetron (ZOFRAN) IV, oxyCODONE-acetaminophen, phenol, sodium chloride, sodium chloride flush   Vital Signs    Vitals:   10/11/23 1942 10/11/23 2349 10/12/23 0507 10/12/23 0523  BP: 98/72 102/61 (!) 97/55   Pulse: 73 76 70   Resp: 17 18 18    Temp: 98.1 F (36.7 C) 98.6 F (37 C) 98.2 F (36.8 C)   TempSrc: Oral Oral Oral   SpO2: 99% 100% 98%   Weight:    117.4 kg  Height:        Intake/Output Summary (Last 24 hours) at 10/12/2023 0928 Last data filed at 10/12/2023 8295 Gross per 24 hour  Intake 600 ml  Output 8200 ml  Net -7600 ml   Filed Weights   10/10/23 0529 10/11/23 0427 10/12/23 0523  Weight: 121.4 kg 120.2 kg 117.4 kg    Telemetry    Afib with a controlled VR - Personally Reviewed  ECG    none - Personally Reviewed  Physical Exam   GEN: Obese, No acute distress.   Neck: No JVD Cardiac: RRR, no murmurs, rubs, soft S3 gallops.  Respiratory: Clear to auscultation bilaterally. GI: Soft, nontender, non-distended  MS: No edema; No deformity. Neuro:  Nonfocal  Psych: Normal affect   Labs    Chemistry Recent Labs  Lab 10/11/23 0440  10/11/23 1300 10/12/23 0318  NA 132* 131* 129*  K 3.2* 3.2* 3.2*  CL 92* 89* 89*  CO2 26 30 27   GLUCOSE 103* 157* 117*  BUN 48* 47* 47*  CREATININE 2.55* 2.46* 2.59*  CALCIUM 8.9 8.6* 9.0  PROT  --   --  7.2  ALBUMIN  --   --  2.3*  AST  --   --  33  ALT  --   --  38  ALKPHOS  --   --  267*  BILITOT  --   --  2.5*  GFRNONAA 32* 33* 31*  ANIONGAP 14 12 13      Hematology Recent Labs  Lab 10/10/23 0545 10/11/23 0440 10/12/23 0318  WBC 13.7* 11.5* 9.1  RBC 4.83 5.09 5.01  HGB 12.9* 13.6 13.1  HCT 39.9 42.1 40.8  MCV 82.6 82.7 81.4  MCH 26.7 26.7 26.1  MCHC 32.3 32.3 32.1  RDW 15.7* 15.9* 15.9*  PLT 263 327 329    Cardiac EnzymesNo results for input(s): "TROPONINI" in the last 168 hours. No results for input(s): "TROPIPOC" in the last 168 hours.   BNPNo results for input(s): "BNP", "PROBNP" in the last 168 hours.   DDimer No  results for input(s): "DDIMER" in the last 168 hours.   Radiology    No results found.  Cardiac Studies   none  Patient Profile     42 y.o. male admitted with acute on chronic systolic heart failure and stage 3 renal failure  Assessment & Plan    Acute on chronic systolic heart failure - he has lost almost 50 lbs since admit and is down over 7 lbs since yesterday. Continue diuresis. Stage 3 renal failure -his creatinine gone from 2.4 to 2.5. Continue to follow.  Atrial fib with a controlled VR - note plans for TEE /DCCV next week. Continue systemic anti-coagulation.   Hypokalemia - please replete.  For questions or updates, please contact [facility] Please consult [email or web address] for contact info under Cardiology/STEMI.      Signed, Lewayne Bunting, MD  10/12/2023, 9:28 AM

## 2023-10-12 NOTE — Progress Notes (Signed)
PROGRESS NOTE    Bradley Powell  WUJ:[id number] DOB: Jul 17, 1981 DOA: 09/29/2023 PCP: [name], MD   Brief Narrative:  This 42 year old male with past medical history of paroxysmal atrial flutter s/p remote ablation, chronic combined systolic/diastolic CHF, NICM, OSA and morbid obesity. He presents to the ER with complaints of increasing fluid retention on his legs up to his stomach. Per patient his been compliant with his medications. He has now been placed on a lasix gtt directed by CHF team. Cardiology is following. Plan is to have TEE/DCCV when adequately diuresed.   Assessment and Plan:  Acute on Chronic Systolic Heart Failure: -Echo >  LVEF 35-40%.  -Patient was started on Lasix gtt as per CHF protocol and is now on 30 mg/hr -Medications adjusted by CHF team. -Strict I's and O's, daily weights  Intake/Output Summary (Last 24 hours) at 10/12/2023 1445 Last data filed at 10/12/2023 9562 Gross per 24 hour  Intake 600 ml  Output 8200 ml  Net -7600 ml   Filed Weights   10/10/23 0529 10/11/23 0427 10/12/23 0523  Weight: 121.4 kg 120.2 kg 117.4 kg   -During this admission the Patient had wanted a break from Lasix gtt this hospitalization so Lasix gtt switched to Lasix 160 mg twice daily plus Diamox but he continued to be severely volume overloaded,  urine output dropped with intermittent dosing of Lasix and Given that he had worsening Serum creatinine up to 2.9 with 5 pound weight gain his Lasix infusion resumed on 10/06/23.  -Metolazone added.  -May require milrinone infusion if urine output not improved. PICC line inserted.  Patient feels improved,  less bloated, urine output increased with Lasix 30 mg/h -Remains volume overloaded.  Continue IV diuresis per Cardiology and remains on Lasix gtt -WBC Trend: Recent Labs  Lab 10/05/23 0311 10/06/23 0314 10/07/23 0849 10/08/23 0525 10/10/23 0545 10/11/23 0440 10/12/23 0318  WBC 17.9* 15.7* 11.7* 9.6 13.7* 11.5* 9.1   -C/w Empagliflozin 10 mg po Daily and Spironolactone 25 mg po Daily -CXR on 10/09/23 done and showed "Left-sided pacing device. Right upper extremity central venous catheter tip over the SVC. Borderline to mild cardiomegaly. No acute airspace disease, pleural effusion or pneumothorax."    AKI on CKD stage 3b -Baseline Cr of 1.7-2.0. -BUN/Cr Trend: Recent Labs  Lab 10/09/23 0436 10/09/23 1417 10/10/23 0545 10/10/23 1431 10/11/23 0440 10/11/23 1300 10/12/23 0318  BUN 51* 46* 44* 44* 48* 47* 47*  CREATININE 2.90* 2.55* 2.52* 2.70* 2.55* 2.46* 2.59*  -Avoid Nephrotoxic Medications, Contrast Dyes, Hypotension and Dehydration to Ensure Adequate Renal Perfusion and will need to Renally Adjust Meds -Continue to Monitor and Trend Renal Function carefully and repeat CMP in the AM    Permanent Atrial Fibrillation/Atrial Flutter  -Continue to Monitor on Cardiac Telemetry -C/w po Amiodarone 200 mg po Daily and Anticoagulation with Apixaban 5 mg po BID. -DCCV would be pursued once patient is adequately diuresed and plans for TEE/DCCV next week. -C/w Mexiletine 150 mg po q12h   Hepatic Cirrhosis (HCC) Hyperbilirubinemia -Likely secondary to NASH. -Checked LFTs this and current trend showed: Recent Labs  Lab 10/04/23 0400 10/12/23 0318  AST 60* 33  ALT 43 38  BILITOT 2.8* 2.5*  -Continue to Monitor and Trend   Knee an Ankle Pain and Hx of Gout -C/w Baclofen 10 mg po BID prn Muscle Spasms, Oxycodone-Acetaminophen 1 tab po q8h prn Severe pain  And po Acetaminophen 650 mg po q6hprn Mild Pain  -C/w Allopurinol 200 mg po Daily  Fever in the setting of PNA, improving -Completed antibiotics for 5 days for pneumonia. -WBC Trend:  Recent Labs  Lab 10/05/23 0311 10/06/23 0314 10/07/23 0849 10/08/23 0525 10/10/23 0545 10/11/23 0440 10/12/23 0318  WBC 17.9* 15.7* 11.7* 9.6 13.7* 11.5* 9.1  -Remains afebrile. -Repeat CXR in the AM   Normocytic Anemia -Hgb/Hct Trend: Recent Labs   Lab 10/05/23 0311 10/06/23 0314 10/07/23 0849 10/08/23 0525 10/10/23 0545 10/11/23 0440 10/12/23 0318  HGB 13.1 13.0 12.3* 13.1 12.9* 13.6 13.1  HCT 41.1 42.2 38.9* 40.9 39.9 42.1 40.8  MCV 83.0 84.9 83.5 81.8 82.6 82.7 81.4  -Check Anemia Panel in the AM -Continue to Monitor for S/Sx of Bleeding; No overt bleeding noted -Repeat CBC in the AM   Hypokalemia -Patient's K+ Level Trend: Recent Labs  Lab 10/09/23 0436 10/09/23 1417 10/10/23 0545 10/10/23 1431 10/11/23 0440 10/11/23 1300 10/12/23 0318  K 2.9* 3.3* 3.1* 3.8 3.2* 3.2* 3.2*  -Currently being replete with po Kcl 40 mEQ x1 again and po KCL 60 mEQ 6 times daily  -Continue to Monitor and Replete as Necessary -Repeat CMP in the AM   GERD/GI Prophylaxis -C/w Pantoprazole 40 mg po BID  Hypoalbuminemia -Patient's Albumin Trend: Recent Labs  Lab 10/04/23 0400 10/12/23 0318  ALBUMIN 2.5* 2.3*  -Continue to Monitor and Trend and repeat CMP in the AM  Morbid Obesity -Complicates overall prognosis and care -Estimated body mass index is 39.35 kg/m as calculated from the following:   Height as of this encounter: 5\' 8"  (1.727 m).   Weight as of this encounter: 117.4 kg.  -Weight Loss and Dietary Counseling given   DVT prophylaxis:  apixaban (ELIQUIS) tablet 5 mg    Code Status: Full Code Family Communication: No family present at bedside   Disposition Plan:  Level of care: Telemetry Cardiac Status is: Inpatient Remains inpatient appropriate because: needs further clinical improvement and diuresis   Consultants:  Cardiology Heart Failure Team  Procedures:  ECHOCARDIOGRAM IMPRESSIONS     1. Left ventricular ejection fraction, by estimation, is 35 to 40%. The  left ventricle has moderately decreased function. The left ventricle  demonstrates global hypokinesis. Left ventricular diastolic parameters are  indeterminate.   2. Right ventricular systolic function is moderately reduced. The right   ventricular size is mildly enlarged. There is mildly elevated pulmonary  artery systolic pressure. The estimated right ventricular systolic  pressure is 39.0 mmHg.   3. The mitral valve is normal in structure. Trivial mitral valve  regurgitation. No evidence of mitral stenosis.   4. The aortic valve is tricuspid. Aortic valve regurgitation is not  visualized. Aortic valve sclerosis is present, with no evidence of aortic  valve stenosis. Aortic valve Vmax measures 1.10 m/s.   5. The inferior vena cava is dilated in size with <50% respiratory  variability, suggesting right atrial pressure of 15 mmHg.   FINDINGS   Left Ventricle: Left ventricular ejection fraction, by estimation, is 35  to 40%. The left ventricle has moderately decreased function. The left  ventricle demonstrates global hypokinesis. Definity contrast agent was  given IV to delineate the left  ventricular endocardial borders. The left ventricular internal cavity size  was normal in size. There is no left ventricular hypertrophy. Left  ventricular diastolic parameters are indeterminate. Normal left  ventricular filling pressure.   Right Ventricle: The right ventricular size is mildly enlarged. No  increase in right ventricular wall thickness. Right ventricular systolic  function is moderately reduced. There is mildly elevated pulmonary  artery  systolic pressure. The tricuspid  regurgitant velocity is 2.45 m/s, and with an assumed right atrial  pressure of 15 mmHg, the estimated right ventricular systolic pressure is  39.0 mmHg.   Left Atrium: Left atrial size was normal in size.   Right Atrium: Right atrial size was normal in size.   Pericardium: There is no evidence of pericardial effusion.   Mitral Valve: The mitral valve is normal in structure. Trivial mitral  valve regurgitation. No evidence of mitral valve stenosis.   Tricuspid Valve: The tricuspid valve is normal in structure. Tricuspid  valve regurgitation  is trivial. No evidence of tricuspid stenosis.   Aortic Valve: The aortic valve is tricuspid. Aortic valve regurgitation is  not visualized. Aortic valve sclerosis is present, with no evidence of  aortic valve stenosis. Aortic valve peak gradient measures 4.8 mmHg.   Pulmonic Valve: The pulmonic valve was normal in structure. Pulmonic valve  regurgitation is not visualized. No evidence of pulmonic stenosis.   Aorta: The aortic root is normal in size and structure.   Venous: The inferior vena cava is dilated in size with less than 50%  respiratory variability, suggesting right atrial pressure of 15 mmHg.   IAS/Shunts: No atrial level shunt detected by color flow Doppler.   Additional Comments: A device lead is visualized.     LEFT VENTRICLE  PLAX 2D  LVIDd:         4.90 cm   Diastology  LVIDs:         3.80 cm   LV e' medial:    7.07 cm/s  LV PW:         1.00 cm   LV E/e' medial:  13.3  LV IVS:        1.00 cm   LV e' lateral:   10.90 cm/s  LVOT diam:     1.80 cm   LV E/e' lateral: 8.6  LV SV:         47  LV SV Index:   20  LVOT Area:     2.54 cm     RIGHT VENTRICLE            IVC  RV S prime:     9.79 cm/s  IVC diam: 3.40 cm  TAPSE (M-mode): 1.6 cm   LEFT ATRIUM             Index        RIGHT ATRIUM           Index  LA diam:        5.10 cm 2.17 cm/m   RA Area:     24.50 cm  LA Vol (A2C):   57.7 ml 24.55 ml/m  RA Volume:   71.70 ml  30.51 ml/m  LA Vol (A4C):   76.1 ml 32.38 ml/m  LA Biplane Vol: 67.5 ml 28.72 ml/m   AORTIC VALVE  AV Area (Vmax): 2.41 cm  AV Vmax:        110.00 cm/s  AV Peak Grad:   4.8 mmHg  LVOT Vmax:      104.33 cm/s  LVOT Vmean:     69.033 cm/s  LVOT VTI:       0.184 m    AORTA  Ao Root diam: 2.60 cm  Ao Asc diam:  2.50 cm   MITRAL VALVE               TRICUSPID VALVE  MV Area (PHT): 5.84 cm  TR Peak grad:   24.0 mmHg  MV Decel Time: 130 msec    TR Vmax:        245.00 cm/s  MR Peak grad: 29.2 mmHg  MR Vmax:      270.00 cm/s  SHUNTS   MV E velocity: 94.00 cm/s  Systemic VTI:  0.18 m  MV A velocity: 49.20 cm/s  Systemic Diam: 1.80 cm  MV E/A ratio:  1.91    Antimicrobials:  Anti-infectives (From admission, onward)    Start     Dose/Rate Route Frequency Ordered Stop   10/06/23 0600  vancomycin (VANCOCIN) IVPB 1000 mg/200 mL premix  Status:  Discontinued        1,000 mg 200 mL/hr over 60 Minutes Intravenous Every 24 hours 10/05/23 0412 10/06/23 1053   10/05/23 1600  ceFEPIme (MAXIPIME) 2 g in sodium chloride 0.9 % 100 mL IVPB  Status:  Discontinued        2 g 200 mL/hr over 30 Minutes Intravenous Every 12 hours 10/05/23 0412 10/09/23 1132   10/05/23 0515  vancomycin (VANCOCIN) IVPB 1000 mg/200 mL premix        1,000 mg 200 mL/hr over 60 Minutes Intravenous Every 1 hr x 2 10/05/23 0415 10/05/23 0927   10/05/23 0500  metroNIDAZOLE (FLAGYL) IVPB 500 mg  Status:  Discontinued        500 mg 100 mL/hr over 60 Minutes Intravenous Every 12 hours 10/05/23 0402 10/06/23 1053   10/05/23 0500  vancomycin (VANCOCIN) IVPB 1000 mg/200 mL premix  Status:  Discontinued        1,000 mg 200 mL/hr over 60 Minutes Intravenous Every 1 hr x 2 10/05/23 0412 10/05/23 0414   10/05/23 0500  ceFEPIme (MAXIPIME) 2 g in sodium chloride 0.9 % 100 mL IVPB        2 g 200 mL/hr over 30 Minutes Intravenous  Once 10/05/23 0412 10/05/23 0926   10/05/23 0500  vancomycin (VANCOREADY) IVPB 2000 mg/400 mL  Status:  Discontinued        2,000 mg 200 mL/hr over 120 Minutes Intravenous  Once 10/05/23 0414 10/05/23 0415       Subjective: Seen and examined at bedside this is doing okay.  No nausea or vomiting.  States he is never really been short of breath.  Continues to diurese and put out significant amount of urine.  Denies any other concerns or complaint at this time.  Objective: Vitals:   10/11/23 2349 10/12/23 0507 10/12/23 0523 10/12/23 1205  BP: 102/61 (!) 97/55  110/75  Pulse: 76 70  86  Resp: 18 18  20   Temp: 98.6 F (37 C) 98.2 F (36.8 C)   98.4 F (36.9 C)  TempSrc: Oral Oral  Oral  SpO2: 100% 98%  100%  Weight:   117.4 kg   Height:        Intake/Output Summary (Last 24 hours) at 10/12/2023 1502 Last data filed at 10/12/2023 7253 Gross per 24 hour  Intake 600 ml  Output 8200 ml  Net -7600 ml   Filed Weights   10/10/23 0529 10/11/23 0427 10/12/23 0523  Weight: 121.4 kg 120.2 kg 117.4 kg    Examination: Physical Exam:  Constitutional: WN/WD morbidly obese African-American male who appears little withdrawn Respiratory: Diminished to auscultation bilaterally, no wheezing, rales, rhonchi or crackles. Normal respiratory effort and patient is not tachypenic. No accessory muscle use.  Unlabored breathing Cardiovascular: RRR, no murmurs / rubs / gallops. S1 and S2 auscultated.  Has 2+  lower extremity pitting edema Abdomen: Soft, non-tender, distended secondary to body habitus.  Bowel sounds positive.  GU: Deferred. Musculoskeletal: No clubbing / cyanosis of digits/nails. No joint deformity upper and lower extremities.  Skin: No rashes, lesions, ulcers on limited skin evaluation. No induration; Warm and dry.  Neurologic: CN 2-12 grossly intact with no focal deficits. Romberg sign and cerebellar reflexes not assessed.  Psychiatric: Normal judgment and insight. Alert and oriented x 3. Normal mood and appropriate affect.   Data Reviewed: I have personally reviewed following labs and imaging studies  CBC: Recent Labs  Lab 10/07/23 0849 10/08/23 0525 10/10/23 0545 10/11/23 0440 10/12/23 0318  WBC 11.7* 9.6 13.7* 11.5* 9.1  NEUTROABS  --   --   --   --  6.6  HGB 12.3* 13.1 12.9* 13.6 13.1  HCT 38.9* 40.9 39.9 42.1 40.8  MCV 83.5 81.8 82.6 82.7 81.4  PLT 204 213 263 327 329   Basic Metabolic Panel: Recent Labs  Lab 10/07/23 0849 10/08/23 0525 10/08/23 1241 10/09/23 1417 10/10/23 0545 10/10/23 1431 10/11/23 0440 10/11/23 1300 10/12/23 0318  NA 133* 132*   < > 131* 133* 132* 132* 131* 129*  K 3.3* 2.6*   <  > 3.3* 3.1* 3.8 3.2* 3.2* 3.2*  CL 97* 91*   < > 91* 93* 94* 92* 89* 89*  CO2 24 27   < > 28 28 26 26 30 27   GLUCOSE 168* 93   < > 130* 98 145* 103* 157* 117*  BUN 49* 52*   < > 46* 44* 44* 48* 47* 47*  CREATININE 2.88* 2.92*   < > 2.55* 2.52* 2.70* 2.55* 2.46* 2.59*  CALCIUM 8.6* 8.8*   < > 8.4* 8.6* 8.7* 8.9 8.6* 9.0  MG 2.2 2.4  --  2.1 2.2  --  2.4  --  2.5*  PHOS 3.4  --   --   --   --   --  3.7  --  3.8   < > = values in this interval not displayed.   GFR: Estimated Creatinine Clearance: 47.2 mL/min (A) (by C-G formula based on SCr of 2.59 mg/dL (H)). Liver Function Tests: Recent Labs  Lab 10/12/23 0318  AST 33  ALT 38  ALKPHOS 267*  BILITOT 2.5*  PROT 7.2  ALBUMIN 2.3*   No results for input(s): "LIPASE", "AMYLASE" in the last 168 hours. No results for input(s): "AMMONIA" in the last 168 hours. Coagulation Profile: No results for input(s): "INR", "PROTIME" in the last 168 hours. Cardiac Enzymes: No results for input(s): "CKTOTAL", "CKMB", "CKMBINDEX", "TROPONINI" in the last 168 hours. BNP (last 3 results) No results for input(s): "PROBNP" in the last 8760 hours. HbA1C: No results for input(s): "HGBA1C" in the last 72 hours. CBG: No results for input(s): "GLUCAP" in the last 168 hours. Lipid Profile: No results for input(s): "CHOL", "HDL", "LDLCALC", "TRIG", "CHOLHDL", "LDLDIRECT" in the last 72 hours. Thyroid Function Tests: No results for input(s): "TSH", "T4TOTAL", "FREET4", "T3FREE", "THYROIDAB" in the last 72 hours. Anemia Panel: No results for input(s): "VITAMINB12", "FOLATE", "FERRITIN", "TIBC", "IRON", "RETICCTPCT" in the last 72 hours. Sepsis Labs: No results for input(s): "PROCALCITON", "LATICACIDVEN" in the last 168 hours.  Recent Results (from the past 240 hour(s))  Culture, blood (Routine X 2) w Reflex to ID Panel     Status: None   Collection Time: 10/05/23 12:02 AM   Specimen: BLOOD RIGHT ARM  Result Value Ref Range Status   Specimen Description  BLOOD RIGHT ARM  Final   Special Requests   Final    BOTTLES DRAWN AEROBIC AND ANAEROBIC Blood Culture results may not be optimal due to an excessive volume of blood received in culture bottles   Culture   Final    NO GROWTH 5 DAYS Performed at [facility], [address], [place], [place] [address]    Report Status 10/10/2023 FINAL  Final  Culture, blood (Routine X 2) w Reflex to ID Panel     Status: None   Collection Time: 10/05/23 12:05 AM   Specimen: BLOOD LEFT ARM  Result Value Ref Range Status   Specimen Description BLOOD LEFT ARM  Final   Special Requests   Final    BOTTLES DRAWN AEROBIC AND ANAEROBIC Blood Culture results may not be optimal due to an excessive volume of blood received in culture bottles   Culture   Final    NO GROWTH 5 DAYS Performed at [facility], [address], [place], [place] [address]    Report Status 10/10/2023 FINAL  Final  Urine Culture (for pregnant, neutropenic or urologic patients or patients with an indwelling urinary catheter)     Status: Abnormal   Collection Time: 10/05/23  5:42 AM   Specimen: Urine, Clean Catch  Result Value Ref Range Status   Specimen Description URINE, CLEAN CATCH  Final   Special Requests NONE  Final   Culture (A)  Final    <10,000 COLONIES/mL INSIGNIFICANT GROWTH Performed at [facility], [address] [place], [place] [address]    Report Status 10/06/2023 FINAL  Final  Respiratory (~20 pathogens) panel by PCR     Status: None   Collection Time: 10/05/23 12:02 PM   Specimen: Nasopharyngeal Swab; Respiratory  Result Value Ref Range Status   Adenovirus NOT DETECTED NOT DETECTED Final   Coronavirus 229E NOT DETECTED NOT DETECTED Final    Comment: (NOTE) The Coronavirus on the Respiratory Panel, DOES NOT test for the novel  Coronavirus (2019 nCoV)    Coronavirus HKU1 NOT DETECTED NOT DETECTED Final   Coronavirus NL63 NOT DETECTED NOT DETECTED Final   Coronavirus OC43 NOT DETECTED NOT DETECTED  Final   Metapneumovirus NOT DETECTED NOT DETECTED Final   Rhinovirus / Enterovirus NOT DETECTED NOT DETECTED Final   Influenza A NOT DETECTED NOT DETECTED Final   Influenza B NOT DETECTED NOT DETECTED Final   Parainfluenza Virus 1 NOT DETECTED NOT DETECTED Final   Parainfluenza Virus 2 NOT DETECTED NOT DETECTED Final   Parainfluenza Virus 3 NOT DETECTED NOT DETECTED Final   Parainfluenza Virus 4 NOT DETECTED NOT DETECTED Final   Respiratory Syncytial Virus NOT DETECTED NOT DETECTED Final   Bordetella pertussis NOT DETECTED NOT DETECTED Final   Bordetella Parapertussis NOT DETECTED NOT DETECTED Final   Chlamydophila pneumoniae NOT DETECTED NOT DETECTED Final   Mycoplasma pneumoniae NOT DETECTED NOT DETECTED Final    Comment: Performed at [facility], [address] [place], [place] [address]    Radiology Studies: No results found.  Scheduled Meds:  acetaZOLAMIDE  250 mg Oral BID   allopurinol  200 mg Oral Daily   amiodarone  200 mg Oral Daily   apixaban  5 mg Oral BID   Chlorhexidine Gluconate Cloth  6 each Topical Daily   empagliflozin  10 mg Oral Daily   levothyroxine  50 mcg Oral Q0600   mexiletine  150 mg Oral Q12H   pantoprazole  40 mg Oral BID   potassium  chloride  60 mEq Oral 6 X Daily   sodium chloride flush  10-40 mL Intracatheter Q12H   spironolactone  25 mg Oral Daily   Continuous Infusions:  furosemide (LASIX) 200 mg in dextrose 5 % 100 mL (2 mg/mL) infusion 30 mg/hr (10/12/23 1444)    LOS: 12 days   Marguerita Merles, DO [facility] Available via Epic secure chat 7am-7pm After these hours, please refer to coverage provider listed on [email or web address] 10/12/2023, 3:02 PM

## 2023-10-12 NOTE — Progress Notes (Signed)
Patient declined the use of CPAP for the night  

## 2023-10-13 DIAGNOSIS — I5023 Acute on chronic systolic (congestive) heart failure: Secondary | ICD-10-CM | POA: Diagnosis not present

## 2023-10-13 DIAGNOSIS — E039 Hypothyroidism, unspecified: Secondary | ICD-10-CM | POA: Diagnosis not present

## 2023-10-13 DIAGNOSIS — E611 Iron deficiency: Secondary | ICD-10-CM

## 2023-10-13 DIAGNOSIS — I482 Chronic atrial fibrillation, unspecified: Secondary | ICD-10-CM

## 2023-10-13 DIAGNOSIS — N179 Acute kidney failure, unspecified: Secondary | ICD-10-CM

## 2023-10-13 DIAGNOSIS — K7460 Unspecified cirrhosis of liver: Secondary | ICD-10-CM

## 2023-10-13 DIAGNOSIS — M109 Gout, unspecified: Secondary | ICD-10-CM | POA: Diagnosis not present

## 2023-10-13 DIAGNOSIS — E66812 Obesity, class 2: Secondary | ICD-10-CM

## 2023-10-13 DIAGNOSIS — D72829 Elevated white blood cell count, unspecified: Secondary | ICD-10-CM

## 2023-10-13 DIAGNOSIS — N189 Chronic kidney disease, unspecified: Secondary | ICD-10-CM

## 2023-10-13 LAB — IRON AND TIBC
Iron: 28 ug/dL — ABNORMAL LOW (ref 45–182)
Saturation Ratios: 7 % — ABNORMAL LOW (ref 17.9–39.5)
TIBC: 424 ug/dL (ref 250–450)
UIBC: 396 ug/dL

## 2023-10-13 LAB — BASIC METABOLIC PANEL
Anion gap: 11 (ref 5–15)
BUN: 50 mg/dL — ABNORMAL HIGH (ref 6–20)
CO2: 31 mmol/L (ref 22–32)
Calcium: 8.9 mg/dL (ref 8.9–10.3)
Chloride: 90 mmol/L — ABNORMAL LOW (ref 98–111)
Creatinine, Ser: 2.51 mg/dL — ABNORMAL HIGH (ref 0.61–1.24)
GFR, Estimated: 32 mL/min — ABNORMAL LOW (ref >60)
Glucose, Bld: 126 mg/dL — ABNORMAL HIGH (ref 70–99)
Potassium: 3 mmol/L — ABNORMAL LOW (ref 3.5–5.1)
Sodium: 132 mmol/L — ABNORMAL LOW (ref 135–145)

## 2023-10-13 LAB — PHOSPHORUS: Phosphorus: 4.2 mg/dL (ref 2.5–4.6)

## 2023-10-13 LAB — CBC WITH DIFFERENTIAL/PLATELET
Abs Immature Granulocytes: 0.36 10*3/uL — ABNORMAL HIGH (ref 0.00–0.07)
Basophils Absolute: 0.1 10*3/uL (ref 0.0–0.1)
Basophils Relative: 1 %
Eosinophils Absolute: 0.1 10*3/uL (ref 0.0–0.5)
Eosinophils Relative: 1 %
HCT: 40.9 % (ref 39.0–52.0)
Hemoglobin: 13.3 g/dL (ref 13.0–17.0)
Immature Granulocytes: 4 %
Lymphocytes Relative: 8 %
Lymphs Abs: 0.7 10*3/uL (ref 0.7–4.0)
MCH: 26.8 pg (ref 26.0–34.0)
MCHC: 32.5 g/dL (ref 30.0–36.0)
MCV: 82.3 fL (ref 80.0–100.0)
Monocytes Absolute: 1.2 10*3/uL — ABNORMAL HIGH (ref 0.1–1.0)
Monocytes Relative: 14 %
Neutro Abs: 6.5 10*3/uL (ref 1.7–7.7)
Neutrophils Relative %: 72 %
Platelets: 375 10*3/uL (ref 150–400)
RBC: 4.97 MIL/uL (ref 4.22–5.81)
RDW: 16 % — ABNORMAL HIGH (ref 11.5–15.5)
WBC: 8.9 10*3/uL (ref 4.0–10.5)
nRBC: 0 % (ref 0.0–0.2)

## 2023-10-13 LAB — COMPREHENSIVE METABOLIC PANEL
ALT: 36 U/L (ref 0–44)
AST: 32 U/L (ref 15–41)
Albumin: 2.4 g/dL — ABNORMAL LOW (ref 3.5–5.0)
Alkaline Phosphatase: 276 U/L — ABNORMAL HIGH (ref 38–126)
Anion gap: 16 — ABNORMAL HIGH (ref 5–15)
BUN: 52 mg/dL — ABNORMAL HIGH (ref 6–20)
CO2: 26 mmol/L (ref 22–32)
Calcium: 8.9 mg/dL (ref 8.9–10.3)
Chloride: 91 mmol/L — ABNORMAL LOW (ref 98–111)
Creatinine, Ser: 2.61 mg/dL — ABNORMAL HIGH (ref 0.61–1.24)
GFR, Estimated: 31 mL/min — ABNORMAL LOW (ref >60)
Glucose, Bld: 122 mg/dL — ABNORMAL HIGH (ref 70–99)
Potassium: 3.5 mmol/L (ref 3.5–5.1)
Sodium: 133 mmol/L — ABNORMAL LOW (ref 135–145)
Total Bilirubin: 2.2 mg/dL — ABNORMAL HIGH (ref <1.2)
Total Protein: 7.6 g/dL (ref 6.5–8.1)

## 2023-10-13 LAB — COOXEMETRY PANEL
Carboxyhemoglobin: 1.9 % — ABNORMAL HIGH (ref 0.5–1.5)
Methemoglobin: <0.7 % (ref 0.0–1.5)
O2 Saturation: 85.3 %
Total hemoglobin: 13.7 g/dL (ref 12.0–16.0)

## 2023-10-13 LAB — RETICULOCYTES
Immature Retic Fract: 29.3 % — ABNORMAL HIGH (ref 2.3–15.9)
RBC.: 5 MIL/uL (ref 4.22–5.81)
Retic Count, Absolute: 165 10*3/uL (ref 19.0–186.0)
Retic Ct Pct: 3.3 % — ABNORMAL HIGH (ref 0.4–3.1)

## 2023-10-13 LAB — VITAMIN B12: Vitamin B-12: 576 pg/mL (ref 180–914)

## 2023-10-13 LAB — BRAIN NATRIURETIC PEPTIDE: B Natriuretic Peptide: 285 pg/mL — ABNORMAL HIGH (ref 0.0–100.0)

## 2023-10-13 LAB — FOLATE: Folate: 14.9 ng/mL (ref >5.9)

## 2023-10-13 LAB — FERRITIN: Ferritin: 129 ng/mL (ref 24–336)

## 2023-10-13 LAB — MAGNESIUM: Magnesium: 2.5 mg/dL — ABNORMAL HIGH (ref 1.7–2.4)

## 2023-10-13 MED ORDER — COLCHICINE 0.3 MG HALF TABLET
0.3000 mg | ORAL_TABLET | Freq: Every day | ORAL | Status: DC
  Administered 2023-10-13 – 2023-10-24 (×12): 0.3 mg via ORAL
  Filled 2023-10-13 (×12): qty 1

## 2023-10-13 MED ORDER — POTASSIUM CHLORIDE 10 MEQ/50ML IV SOLN
10.0000 meq | INTRAVENOUS | Status: AC
Start: 2023-10-13 — End: 2023-10-13
  Administered 2023-10-13 (×4): 10 meq via INTRAVENOUS
  Filled 2023-10-13 (×4): qty 50

## 2023-10-13 MED ORDER — METOLAZONE 5 MG PO TABS
5.0000 mg | ORAL_TABLET | Freq: Once | ORAL | Status: AC
  Administered 2023-10-13: 5 mg via ORAL
  Filled 2023-10-13: qty 1

## 2023-10-13 MED ORDER — DICLOFENAC SODIUM 1 % EX GEL
2.0000 g | Freq: Four times a day (QID) | CUTANEOUS | Status: DC
  Administered 2023-10-13 – 2023-10-24 (×41): 2 g via TOPICAL
  Filled 2023-10-13 (×2): qty 100

## 2023-10-13 NOTE — Assessment & Plan Note (Addendum)
Chronic atrial flutter.  Syncope with ventricular tachycardia.  Sp ICD 02/2022   Continue rate and rhythm control with amiodarone and mexiletine.  Anticoagulation with apixaban.

## 2023-10-13 NOTE — Progress Notes (Addendum)
Progress Note   Patient: Bradley Powell ZOX:[id number] DOB: January 22, 1981 DOA: 09/29/2023     13 DOS: the patient was seen and examined on 10/13/2023   Brief hospital course: Mr. Haggstrom was admitted to the hospital with the working diagnosis of heart failure decompensation.   42 year old male with past medical history of paroxysmal atrial flutter s/p remote ablation, chronic combined systolic/diastolic CHF, NICM, OSA and morbid obesity. He presents to the ER with complaints of increasing fluid retention on his legs up to his stomach. Per patient his been compliant with his medications.   He has now been placed on a lasix gtt  Plan is to have TEE/DCCV when adequately diuresed.   11/29 continue to have severe volume overload.  Assessment and Plan: Acute on chronic systolic (congestive) heart failure (HCC) Echocardiogram with reduced LV systolic function with EF 35 to 40%, global hypokinesis, RV systolic function with moderate reduction, RV with mild enlargement, RVSP 39 mmHg. Right and left atrium with normal size.   Urine output is 9,100 ml Systolic blood pressure low 100's mmHg.  SV02 85.3   Plan to continue diuresis with furosemide drip 30 mg/hr, sequential nephron blockade with acetazolamide, empagliflozin, and spironolactone.  One dose of metolazone today 5 mg.  Atrial fibrillation, chronic (HCC) Chronic atrial flutter.  Syncope with ventricular tachycardia.  Sp ICD 02/2022   Continue rate and rhythm control with amiodarone and mexiletine.  Anticoagulation with apixaban.    Acute kidney injury superimposed on chronic kidney disease (HCC) CKD stage 3b, Hyponatremia, hypokalemia.   Renal function with serum cr at 2,6 with K at 3,5 and serum bicarbonate at 26  Na 13.   Plan to continue aggressive diuresis with acetazolamide, SGLT 2 inh, spironolactone, furosemide and metolazone.  Follow up renal function and electrolytes in am.  Continue K correction with Kcl.    Hepatic cirrhosis (HCC) Continue diuresis with furosemide.  No signs of encephalopathy.   Gout No clinical signs of acute exacerbation.   Patient has osteoarthritis of the knees, will add topical diclofenac to right knee.   Obesity, class 2 Calculated BMI is 38.3   Leukocytosis Reactive leukocytosis,.  Patient completed 5 days of antibiotic therapy with cefepime.    Hypothyroidism Continue with levothyroxine.   Iron deficiency Serum iron of 28, TIBC 424, transferrin saturation 7 and ferritin 129. Plan to add IV iron during this hospitalization.  Follow up hgb is 13,6         Subjective: Patient with improvement in dyspnea and edema but not back to baseline, he is having right knee and right ankle pain   Physical Exam: Vitals:   10/13/23 0513 10/13/23 0913 10/13/23 1128 10/13/23 1613  BP: 104/70 105/66 93/67 111/72  Pulse: 64 74 72 75  Resp: 20 18 18 18   Temp:  (!) 97.4 F (36.3 C) 98.4 F (36.9 C) 98.3 F (36.8 C)  TempSrc:  Axillary Oral Oral  SpO2: 99% 100%  98%  Weight: 114.5 kg     Height:       Neurology awake and alert ENT with mild pallor Cardiovascular with S1 and S2 present and regular with no gallops, rubs or murmurs Respiratory with rales at bases with no wheezing or rhonchi Abdomen with no distention  Positive lower extremity edema +++ more right than left Data Reviewed:    Family Communication: no family at the bedside   Disposition: Status is: Inpatient Remains inpatient appropriate because: diuresis   Planned Discharge Destination: Home  Author: Coralie Keens, MD 10/13/2023 4:50 PM  For on call review [email or web address].

## 2023-10-13 NOTE — TOC Progression Note (Signed)
Transition of Care [facility]) - Progression Note    Patient Details  Name: Bradley Powell MRN: [id number] Date of Birth: 06-Sep-1981  [facility] of [facility]) CM/SW Contact  Nicanor Bake Phone Number: [phone] 10/13/2023, 2:48 PM  Clinical Narrative: HF CSW attempted to meet with pt. Pts nurse inquired with pt to see if he wanted to be seen today. Pt declined being seen at this time. Pt appeared to be frustrated and stated that, "I am not going home anytime soon so.." CSW informed nurse that she will just meet with pt at a later time.   TOC will continue following.       Expected Discharge Plan: Home/Self Care Barriers to Discharge: Continued Medical Work up  Expected Discharge Plan and Services       Living arrangements for the past 2 months: Single Family Home                                       Social Determinants of Health (SDOH) Interventions SDOH Screenings   Food Insecurity: No Food Insecurity (09/30/2023)  Housing: Low Risk  (09/30/2023)  Transportation Needs: No Transportation Needs (09/30/2023)  Utilities: Not At Risk (09/30/2023)  Tobacco Use: Low Risk  (09/29/2023)    Readmission Risk Interventions     No data to display

## 2023-10-13 NOTE — Assessment & Plan Note (Signed)
CKD stage 3b, Hyponatremia, hypokalemia.   Renal function with serum cr ata 2,4 with K at 3,8 and serum bicarbonate at 25.  Na 130  Diuresis with acetazolamide, SGLT 2 inh, spironolactone IV  furosemide drip.    Continue aggressive K correction with KCL   Follow up renal function and electrolytes in am.

## 2023-10-13 NOTE — Assessment & Plan Note (Signed)
Serum iron of 28, TIBC 424, transferrin saturation 7 and ferritin 129. Plan to add IV iron during this hospitalization.  Follow up hgb is 13,6

## 2023-10-13 NOTE — Assessment & Plan Note (Signed)
Will continue with allopurinol and colchicine, he has high uric acid, and with high dose of loop diuretic he is in risk for acute flare.   Patient has osteoarthritis of the knees, will add topical diclofenac to right knee.

## 2023-10-13 NOTE — Assessment & Plan Note (Signed)
Reactive leukocytosis,.  Patient completed 5 days of antibiotic therapy with cefepime.

## 2023-10-13 NOTE — Plan of Care (Signed)
  Problem: Activity: Goal: Risk for activity intolerance will decrease Outcome: Progressing   Problem: Education: Goal: Ability to demonstrate management of disease process will improve Outcome: Progressing   Problem: Activity: Goal: Capacity to carry out activities will improve Outcome: Progressing

## 2023-10-13 NOTE — Assessment & Plan Note (Signed)
Continue diuresis with furosemide.  No signs of encephalopathy.

## 2023-10-13 NOTE — Progress Notes (Signed)
Advanced Heart Failure Rounding Note   Subjective:    Co-ox stable% . -9.1L UOP yesterday and already -4L UOP this am.  On Lasix gtt 30/hr + Diamox 250 BID.  CVP 18  Down 51lb since admission  sCr stable 2.61  Feels fine just frustrated with the swelling in his legs. Wants to ambulate today. Denies CP/SOB.   Objective:    Echo 11/19: EF 35-40%, LV with GHK, RV mod reduced, mildly elevated PASP, RVSP ~39, trivial MR   Weight Range:  Vital Signs:   Temp:  [97.6 F (36.4 C)-98.4 F (36.9 C)] 97.8 F (36.6 C) (11/29 0512) Pulse Rate:  [60-86] 64 (11/29 0513) Resp:  [17-20] 20 (11/29 0513) BP: (92-110)/(54-75) 104/70 (11/29 0513) SpO2:  [97 %-100 %] 99 % (11/29 0513) Weight:  [114.5 kg] 114.5 kg (11/29 0513) Last BM Date : 10/12/23  Weight change: Filed Weights   10/11/23 0427 10/12/23 0523 10/13/23 0513  Weight: 120.2 kg 117.4 kg 114.5 kg    Intake/Output:   Intake/Output Summary (Last 24 hours) at 10/13/2023 0736 Last data filed at 10/13/2023 0703 Gross per 24 hour  Intake 240 ml  Output 8400 ml  Net -8160 ml     Physical Exam: CVP 18 General:  well appearing.  No respiratory difficulty HEENT: normal Neck: supple. JVD elevated. Carotids 2+ bilat; no bruits. No lymphadenopathy or thyromegaly appreciated. Cor: PMI nondisplaced. Regular rate & irregular rhythm. No rubs, gallops or murmurs. Lungs: clear Abdomen: soft, nontender, nondistended. No hepatosplenomegaly. No bruits or masses. Good bowel sounds. Extremities: no cyanosis, clubbing, rash, +2-3 BLE edema  Neuro: alert & oriented x 3, cranial nerves grossly intact. moves all 4 extremities w/o difficulty. Affect pleasant.   Telemetry: Afib/A-Flutter 80s (personally reviewed)  Labs: Basic Metabolic Panel: Recent Labs  Lab 10/07/23 0849 10/08/23 0525 10/09/23 1417 10/10/23 0545 10/10/23 1431 10/11/23 0440 10/11/23 1300 10/12/23 0318 10/13/23 0300  NA 133*   < > 131* 133* 132* 132* 131*  129* 133*  K 3.3*   < > 3.3* 3.1* 3.8 3.2* 3.2* 3.2* 3.5  CL 97*   < > 91* 93* 94* 92* 89* 89* 91*  CO2 24   < > 28 28 26 26 30 27 26   GLUCOSE 168*   < > 130* 98 145* 103* 157* 117* 122*  BUN 49*   < > 46* 44* 44* 48* 47* 47* 52*  CREATININE 2.88*   < > 2.55* 2.52* 2.70* 2.55* 2.46* 2.59* 2.61*  CALCIUM 8.6*   < > 8.4* 8.6* 8.7* 8.9 8.6* 9.0 8.9  MG 2.2   < > 2.1 2.2  --  2.4  --  2.5* 2.5*  PHOS 3.4  --   --   --   --  3.7  --  3.8 4.2   < > = values in this interval not displayed.   Liver Function Tests: Recent Labs  Lab 10/12/23 0318 10/13/23 0300  AST 33 32  ALT 38 36  ALKPHOS 267* 276*  BILITOT 2.5* 2.2*  PROT 7.2 7.6  ALBUMIN 2.3* 2.4*    No results for input(s): "LIPASE", "AMYLASE" in the last 168 hours. No results for input(s): "AMMONIA" in the last 168 hours.  CBC: Recent Labs  Lab 10/08/23 0525 10/10/23 0545 10/11/23 0440 10/12/23 0318 10/13/23 0300  WBC 9.6 13.7* 11.5* 9.1 8.9  NEUTROABS  --   --   --  6.6 6.5  HGB 13.1 12.9* 13.6 13.1 13.3  HCT 40.9 39.9 42.1  40.8 40.9  MCV 81.8 82.6 82.7 81.4 82.3  PLT 213 263 327 329 375   Cardiac Enzymes: No results for input(s): "CKTOTAL", "CKMB", "CKMBINDEX", "TROPONINI" in the last 168 hours.  BNP: BNP (last 3 results) Recent Labs    05/23/23 1618 09/30/23 0513 10/13/23 0300  BNP 209.9* 222.1* 285.0*   ProBNP (last 3 results) No results for input(s): "PROBNP" in the last 8760 hours.  Imaging: No results found.  Medications:    Scheduled Medications:  acetaZOLAMIDE  250 mg Oral BID   allopurinol  200 mg Oral Daily   amiodarone  200 mg Oral Daily   apixaban  5 mg Oral BID   Chlorhexidine Gluconate Cloth  6 each Topical Daily   empagliflozin  10 mg Oral Daily   levothyroxine  50 mcg Oral Q0600   mexiletine  150 mg Oral Q12H   pantoprazole  40 mg Oral BID   potassium chloride  60 mEq Oral 6 X Daily   sodium chloride flush  10-40 mL Intracatheter Q12H   spironolactone  25 mg Oral Daily     Infusions:  furosemide (LASIX) 200 mg in dextrose 5 % 100 mL (2 mg/mL) infusion 30 mg/hr (10/13/23 0440)    PRN Medications: acetaminophen, baclofen, ondansetron (ZOFRAN) IV, oxyCODONE-acetaminophen, phenol, sodium chloride, sodium chloride flush  Assessment/Plan:   1. 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 Ratio 1.26 read as equivocal.  SPEP negative - cMRI repeated (11/22): LVEF 43%, RVEF 44%, LGE concerning for sarcoid.  - Echo (4/23): EF 35-40% Moderate RV dysfunction - Has seen geneticist, Dr. Jomarie Powell. Work up c/w LMNA.  - Admitted with NYHA IIIb symptoms and 40 pound weight gain - Echo 11/19 EF 35-40%, LV with GHK, RV mod reduced, mildly elevated PASP, RVSP ~39, trivial MR  - Remains massively volume overloaded with R>>L HF. Slow progress. CVP 18. - Continue lasix 30/hr. Metolazone 5mg  + Diamox 250 bid today. - Continue Spiro to help with hypokalemia given stable sCr.  - Continue Jardiance 10 - Has been off b-blocker due to bradycardia and heart block - Off ARNi and ARB (hypotension and N/V in the past) - can rechallenge as able   2. Syncope/VT - 04/23 > secondary to VT - s/p ICD 4/23. - Continue mexiletine 150 mg bid   - Continue amiodarone 200 mg daiy - Followed by Dr. Ladona Powell  3. AKI on CKD Stage IIIb - Due to cardiorenal syndrome. Baseline SCr 1.7-2.0. Peaked 3.03 - Overall improving with diuresis. Stable 2.61 today. - Diuresis as above.    4. Chronic AF/AFL - S/p AFL ablation 5/19 with Dr. Ladona Powell. - Continue Eliquis 5 mg bid and amiodarone 200 daily  - No role for DC-CV as he has been in AFL for a long time   5.  OSA: Continue CPAP   6. PVCs/VT - Continue mexiletine + amiodarone. - Followed by EP   7. Obesity - Body mass index is Body mass index is 38.38 kg/m. - Consider GLP1RA  8. Hypokalemia - K 3.5 - Continue Spiro - On high dose replacement. Continue to  follow BMETs with diuresis  9. ID - per primary. WBC peaked at 21.6 with fever. No infective source identified.  - UA (-), PCXR (-), RVP (-), Bcx (-) - Completed Cefepime IV 5 day - WBC 9.6>13.7>11.5>8.9 off abx. Afebrile.    10. H/o Gout - R>L knee/leg swelling  -  Uric acid 7.0  Length of Stay: 13  Bradley Powell Merlene Morse AGACNP-BC 10/13/2023, 7:36 AM  Advanced Heart Failure Team Pager [phone] (M-F; 7a - 4p)  Please contact [facility] for night-coverage after hours (4p -7a ) and weekends on [email or web address]

## 2023-10-13 NOTE — Assessment & Plan Note (Signed)
Continue with levothyroxine  

## 2023-10-13 NOTE — Assessment & Plan Note (Signed)
Calculated BMI is 38.3

## 2023-10-13 NOTE — Assessment & Plan Note (Addendum)
Echocardiogram with reduced LV systolic function with EF 35 to 40%, global hypokinesis, RV systolic function with moderate reduction, RV with mild enlargement, RVSP 39 mmHg. Right and left atrium with normal size.   Urine output is 9,100 ml Systolic blood pressure low 100's mmHg.  SV02 85.3   Plan to continue diuresis with furosemide drip 30 mg/hr, sequential nephron blockade with acetazolamide, empagliflozin, and spironolactone.  One dose of metolazone today 5 mg.

## 2023-10-14 DIAGNOSIS — N179 Acute kidney failure, unspecified: Secondary | ICD-10-CM | POA: Diagnosis not present

## 2023-10-14 DIAGNOSIS — I5023 Acute on chronic systolic (congestive) heart failure: Secondary | ICD-10-CM | POA: Diagnosis not present

## 2023-10-14 DIAGNOSIS — N189 Chronic kidney disease, unspecified: Secondary | ICD-10-CM | POA: Diagnosis not present

## 2023-10-14 DIAGNOSIS — I482 Chronic atrial fibrillation, unspecified: Secondary | ICD-10-CM | POA: Diagnosis not present

## 2023-10-14 DIAGNOSIS — K7460 Unspecified cirrhosis of liver: Secondary | ICD-10-CM | POA: Diagnosis not present

## 2023-10-14 LAB — BASIC METABOLIC PANEL
Anion gap: 12 (ref 5–15)
Anion gap: 15 (ref 5–15)
BUN: 51 mg/dL — ABNORMAL HIGH (ref 6–20)
BUN: 50 mg/dL — ABNORMAL HIGH (ref 6–20)
CO2: 31 mmol/L (ref 22–32)
CO2: 26 mmol/L (ref 22–32)
Calcium: 9.2 mg/dL (ref 8.9–10.3)
Calcium: 9 mg/dL (ref 8.9–10.3)
Chloride: 88 mmol/L — ABNORMAL LOW (ref 98–111)
Chloride: 91 mmol/L — ABNORMAL LOW (ref 98–111)
Creatinine, Ser: 2.48 mg/dL — ABNORMAL HIGH (ref 0.61–1.24)
Creatinine, Ser: 2.52 mg/dL — ABNORMAL HIGH (ref 0.61–1.24)
GFR, Estimated: 33 mL/min — ABNORMAL LOW (ref >60)
GFR, Estimated: 32 mL/min — ABNORMAL LOW (ref >60)
Glucose, Bld: 124 mg/dL — ABNORMAL HIGH (ref 70–99)
Glucose, Bld: 117 mg/dL — ABNORMAL HIGH (ref 70–99)
Potassium: 2.9 mmol/L — ABNORMAL LOW (ref 3.5–5.1)
Potassium: 3.6 mmol/L (ref 3.5–5.1)
Sodium: 131 mmol/L — ABNORMAL LOW (ref 135–145)
Sodium: 132 mmol/L — ABNORMAL LOW (ref 135–145)

## 2023-10-14 LAB — CBC
HCT: 40.8 % (ref 39.0–52.0)
Hemoglobin: 13 g/dL (ref 13.0–17.0)
MCH: 26.1 pg (ref 26.0–34.0)
MCHC: 31.9 g/dL (ref 30.0–36.0)
MCV: 81.9 fL (ref 80.0–100.0)
Platelets: 365 10*3/uL (ref 150–400)
RBC: 4.98 MIL/uL (ref 4.22–5.81)
RDW: 16.2 % — ABNORMAL HIGH (ref 11.5–15.5)
WBC: 11.2 10*3/uL — ABNORMAL HIGH (ref 4.0–10.5)
nRBC: 0 % (ref 0.0–0.2)

## 2023-10-14 LAB — COOXEMETRY PANEL
Carboxyhemoglobin: 1.7 % — ABNORMAL HIGH (ref 0.5–1.5)
Methemoglobin: <0.7 % (ref 0.0–1.5)
O2 Saturation: 86.2 %
Total hemoglobin: 13.6 g/dL (ref 12.0–16.0)

## 2023-10-14 MED ORDER — POTASSIUM CHLORIDE CRYS ER 20 MEQ PO TBCR
60.0000 meq | EXTENDED_RELEASE_TABLET | Freq: Three times a day (TID) | ORAL | Status: DC
  Administered 2023-10-14 (×2): 60 meq via ORAL
  Filled 2023-10-14: qty 3

## 2023-10-14 MED ORDER — POTASSIUM CHLORIDE CRYS ER 20 MEQ PO TBCR
60.0000 meq | EXTENDED_RELEASE_TABLET | Freq: Four times a day (QID) | ORAL | Status: DC
  Administered 2023-10-14: 60 meq via ORAL
  Filled 2023-10-14 (×2): qty 3

## 2023-10-14 MED ORDER — FUROSEMIDE 10 MG/ML IJ SOLN
30.0000 mg/h | INTRAVENOUS | Status: DC
  Administered 2023-10-14 – 2023-10-22 (×26): 30 mg/h via INTRAVENOUS
  Filled 2023-10-14 (×27): qty 20

## 2023-10-14 MED ORDER — POTASSIUM CHLORIDE 10 MEQ/50ML IV SOLN
10.0000 meq | INTRAVENOUS | Status: AC
  Administered 2023-10-14 (×4): 10 meq via INTRAVENOUS
  Filled 2023-10-14 (×4): qty 50

## 2023-10-14 NOTE — Progress Notes (Signed)
Progress Note   Patient: Bradley Powell [id number]:[id number] DOB: 08-31-1981 DOA: 09/29/2023     14 DOS: the patient was seen and examined on 10/14/2023   Brief hospital course: Mr. Bakken was admitted to the hospital with the working diagnosis of heart failure decompensation.   42 year old male with past medical history of paroxysmal atrial flutter s/p remote ablation, chronic combined systolic/diastolic CHF, NICM, OSA and morbid obesity. He presents to the ER with complaints of increasing fluid retention on his legs up to his stomach. Per patient his been compliant with his medications.   He has now been placed on a lasix gtt  Plan is to have TEE/DCCV when adequately diuresed.   11/29 continue to have severe volume overload.  Assessment and Plan: Acute on chronic systolic (congestive) heart failure (HCC) Echocardiogram with reduced LV systolic function with EF 35 to 40%, global hypokinesis, RV systolic function with moderate reduction, RV with mild enlargement, RVSP 39 mmHg. Right and left atrium with normal size.   Urine output is 11,600 ml Systolic blood pressure low 98 mmHg.  SV02 86.2   Diuresis with acetazolamide, empagliflozin, and spironolactone.  11/29 metolazone 5 mg.  Holding furosemide for now.  Atrial fibrillation, chronic (HCC) Chronic atrial flutter.  Syncope with ventricular tachycardia.  Sp ICD 02/2022   Continue rate and rhythm control with amiodarone and mexiletine.  Anticoagulation with apixaban.    Acute kidney injury superimposed on chronic kidney disease (HCC) CKD stage 3b, Hyponatremia, hypokalemia.   Renal function with serum cr at 2.48 with K at 2,9 and serum bicarbonate at 31.  Na 131   Diuresis with acetazolamide, SGLT 2 inh, spironolactone holding on furosemide  Continue aggressive K correction with KCL 60 meq x 3 doses and one 40 meq IV.  Follow up BMP this pm.   Follow up renal function and electrolytes in am.    Hepatic cirrhosis  (HCC) Continue diuresis with furosemide.  No signs of encephalopathy.   Gout Will continue with allopurinol and colchicine, he has high uric acid, and with high dose of loop diuretic he is in risk for acute flare.   Patient has osteoarthritis of the knees, will add topical diclofenac to right knee.   Obesity, class 2 Calculated BMI is 38.3   Leukocytosis Reactive leukocytosis,.  Patient completed 5 days of antibiotic therapy with cefepime.    Hypothyroidism Continue with levothyroxine.   Iron deficiency Serum iron of 28, TIBC 424, transferrin saturation 7 and ferritin 129. Plan to add IV iron during this hospitalization.  Follow up hgb is 13,0         Subjective: Patient with improving edema but not yet back to baseline, right knee pain has improved.   Physical Exam: Vitals:   10/13/23 1613 10/14/23 0042 10/14/23 0531 10/14/23 0755  BP: 111/72 108/65 98/64 97/67   Pulse: 75 71 68 72  Resp: 18 18 18 17   Temp: 98.3 F (36.8 C) 97.7 F (36.5 C) (!) 97.5 F (36.4 C) 97.6 F (36.4 C)  TempSrc: Oral Oral Oral Oral  SpO2: 98% 100% 97% 100%  Weight:   112 kg   Height:       Neurology awake and alert ENT with mild pallor Cardiovascular with S1 and S2 present, irregularly irregular, with no gallops Moderate JVD  Positive lower extremity edema ++ Respiratory with mild rales at bases with no wheezing, no rhonchi Abdomen with no distention  Data Reviewed:    Family Communication: no family at the bedside  Disposition: Status is: Inpatient Remains inpatient appropriate because: IV diuresis   Planned Discharge Destination: Home      Author: Coralie Keens, MD 10/14/2023 2:58 PM  For on call review [email or web address].

## 2023-10-14 NOTE — Plan of Care (Signed)
  Problem: Clinical Measurements: Goal: Ability to maintain clinical measurements within normal limits will improve Outcome: Not Progressing   Problem: Activity: Goal: Risk for activity intolerance will decrease Outcome: Progressing

## 2023-10-14 NOTE — Progress Notes (Signed)
Cardiology Progress Note  Patient ID: Bradley Powell MRN: [id number] DOB: [birth year] Date of Encounter: 10/14/2023 Primary Cardiologist: None  Subjective   Chief Complaint: SOB  HPI: Net -8.9 L overnight.  Still with some edema on exam.  Approaching euvolemia.  Potassium 2.9.  ROS:  All other ROS reviewed and negative. Pertinent positives noted in the HPI.     Telemetry  Overnight telemetry shows A-fib heart rate 80s, which I personally reviewed.    Physical Exam   Vitals:   10/13/23 1613 10/14/23 0042 10/14/23 0531 10/14/23 0755  BP: 111/72 108/65 98/64 97/67   Pulse: 75 71 68 72  Resp: 18 18 18 17   Temp: 98.3 F (36.8 C) 97.7 F (36.5 C) (!) 97.5 F (36.4 C) 97.6 F (36.4 C)  TempSrc: Oral Oral Oral Oral  SpO2: 98% 100% 97% 100%  Weight:   112 kg   Height:        Intake/Output Summary (Last 24 hours) at 10/14/2023 1028 Last data filed at 10/14/2023 0824 Gross per 24 hour  Intake 2603.2 ml  Output 8000 ml  Net -5396.8 ml       10/14/2023    5:31 AM 10/13/2023    5:13 AM 10/12/2023    5:23 AM  Last 3 Weights  Weight (lbs) 246 lb 14.4 oz 252 lb 6.4 oz 258 lb 13.1 oz  Weight (kg) 111.993 kg 114.488 kg 117.4 kg    Body mass index is 37.54 kg/m.  General: Well nourished, well developed, in no acute distress Head: Atraumatic, normal size  Eyes: PEERLA, EOMI  Neck: Supple, JVD 10-12 cmH2O Endocrine: No thryomegaly Cardiac: Normal S1, S2; irregular rhythm, no murmurs Lungs: Clear to auscultation bilaterally, no wheezing, rhonchi or rales  Abd: Soft, nontender, no hepatomegaly  Ext: Trace edema Musculoskeletal: No deformities, BUE and BLE strength normal and equal Skin: Warm and dry, no rashes   Neuro: Alert and oriented to person, place, time, and situation, CNII-XII grossly intact, no focal deficits  Psych: Normal mood and affect   Cardiac Studies  TTE 10/03/2023  1. Left ventricular ejection fraction, by estimation, is 35 to 40%. The  left  ventricle has moderately decreased function. The left ventricle  demonstrates global hypokinesis. Left ventricular diastolic parameters are  indeterminate.   2. Right ventricular systolic function is moderately reduced. The right  ventricular size is mildly enlarged. There is mildly elevated pulmonary  artery systolic pressure. The estimated right ventricular systolic  pressure is 39.0 mmHg.   3. The mitral valve is normal in structure. Trivial mitral valve  regurgitation. No evidence of mitral stenosis.   4. The aortic valve is tricuspid. Aortic valve regurgitation is not  visualized. Aortic valve sclerosis is present, with no evidence of aortic  valve stenosis. Aortic valve Vmax measures 1.10 m/s.   5. The inferior vena cava is dilated in size with <50% respiratory  variability, suggesting right atrial pressure of 15 mmHg.   Patient Profile  Bradley Powell is a 42 y.o. male with chronic systolic heart failure (LMNA cardiomyopathy), CKD stage IIIb, permanent atrial fibrillation, ventricular tachycardia, morbid obesity admitted on 09/29/2023 for acute on chronic systolic heart failure.  Assessment & Plan   # Acute on chronic systolic heart failure, EF 35 to 40% # LMNA cardiomyopathy # Hypokalemia -Still bit volume up.  Net -8.9 L overnight.  Potassium 2.9.  Stop Lasix drip. -He has been given for runs of IV Lasix at 10 mill equivalents per dose.  I have changed  his potassium oral to 60 mEq 3 times daily.  We will hold his Lasix until his potassium is acceptable.  Recheck BMP at 2 PM. -We will then restart Lasix drip but likely at a lower dose to avoid hypokalemia.  He seems to be responding well. -Continue Aldactone 25 mg daily continue Jardiance 10 mg daily -Not on Entresto due to hypotension.  BP will likely not tolerate further afterload reduction. -Not on beta-blocker due to bradycardia and heart block in the past. -Co. ox is stable. -We may be able to get him on oral diuretics  in the next day or 2.  # Ventricular tachycardia -Continue mexiletine and amiodarone.  # CKD stage IIIb -Kidney function is stable  # Hypokalemia -Replacement as above  # Permanent atrial fibrillation -Rate controlled on no medication.  Continue Eliquis.  He is on amiodarone.      For questions or updates, please contact [facility] Please consult [email or web address] for contact info under        Signed, Gerri Spore T. Flora Lipps, MD, [facility]  [facility]  10/14/2023 10:28 AM

## 2023-10-14 NOTE — Progress Notes (Signed)
Orthopedic Tech Progress Note Patient Details:  Bradley Powell 07-10-1981 [id number]  Ortho Devices Type of Ortho Device: Radio broadcast assistant Ortho Device/Splint Location: Bi LE Ortho Device/Splint Interventions: Application   Post Interventions Patient Tolerated: Well  Myrella Fahs E Tayte Mcwherter 10/14/2023, 9:07 AM

## 2023-10-15 DIAGNOSIS — I5023 Acute on chronic systolic (congestive) heart failure: Secondary | ICD-10-CM | POA: Diagnosis not present

## 2023-10-15 DIAGNOSIS — K7460 Unspecified cirrhosis of liver: Secondary | ICD-10-CM | POA: Diagnosis not present

## 2023-10-15 DIAGNOSIS — I482 Chronic atrial fibrillation, unspecified: Secondary | ICD-10-CM | POA: Diagnosis not present

## 2023-10-15 DIAGNOSIS — N179 Acute kidney failure, unspecified: Secondary | ICD-10-CM | POA: Diagnosis not present

## 2023-10-15 DIAGNOSIS — N189 Chronic kidney disease, unspecified: Secondary | ICD-10-CM | POA: Diagnosis not present

## 2023-10-15 DIAGNOSIS — M109 Gout, unspecified: Secondary | ICD-10-CM | POA: Diagnosis not present

## 2023-10-15 LAB — BASIC METABOLIC PANEL
Anion gap: 10 (ref 5–15)
BUN: 51 mg/dL — ABNORMAL HIGH (ref 6–20)
CO2: 27 mmol/L (ref 22–32)
Calcium: 9.1 mg/dL (ref 8.9–10.3)
Chloride: 94 mmol/L — ABNORMAL LOW (ref 98–111)
Creatinine, Ser: 2.4 mg/dL — ABNORMAL HIGH (ref 0.61–1.24)
GFR, Estimated: 34 mL/min — ABNORMAL LOW (ref >60)
Glucose, Bld: 124 mg/dL — ABNORMAL HIGH (ref 70–99)
Potassium: 3.3 mmol/L — ABNORMAL LOW (ref 3.5–5.1)
Sodium: 131 mmol/L — ABNORMAL LOW (ref 135–145)

## 2023-10-15 LAB — CBC
HCT: 40.1 % (ref 39.0–52.0)
Hemoglobin: 12.5 g/dL — ABNORMAL LOW (ref 13.0–17.0)
MCH: 26.3 pg (ref 26.0–34.0)
MCHC: 31.2 g/dL (ref 30.0–36.0)
MCV: 84.2 fL (ref 80.0–100.0)
Platelets: 387 10*3/uL (ref 150–400)
RBC: 4.76 MIL/uL (ref 4.22–5.81)
RDW: 16.3 % — ABNORMAL HIGH (ref 11.5–15.5)
WBC: 9.3 10*3/uL (ref 4.0–10.5)
nRBC: 0 % (ref 0.0–0.2)

## 2023-10-15 LAB — MAGNESIUM: Magnesium: 2.8 mg/dL — ABNORMAL HIGH (ref 1.7–2.4)

## 2023-10-15 LAB — COOXEMETRY PANEL
Carboxyhemoglobin: 1.5 % (ref 0.5–1.5)
Methemoglobin: <0.7 % (ref 0.0–1.5)
O2 Saturation: 75.3 %
Total hemoglobin: 13.4 g/dL (ref 12.0–16.0)

## 2023-10-15 MED ORDER — POTASSIUM CHLORIDE CRYS ER 20 MEQ PO TBCR
60.0000 meq | EXTENDED_RELEASE_TABLET | Freq: Every day | ORAL | Status: DC
  Administered 2023-10-15 – 2023-10-16 (×9): 60 meq via ORAL
  Filled 2023-10-15 (×8): qty 3

## 2023-10-15 NOTE — Plan of Care (Signed)
  Problem: Clinical Measurements: Goal: Diagnostic test results will improve Outcome: Progressing   Problem: Activity: Goal: Risk for activity intolerance will decrease Outcome: Progressing   Problem: Activity: Goal: Capacity to carry out activities will improve Outcome: Progressing

## 2023-10-15 NOTE — Progress Notes (Signed)
Progress Note   Patient: Bradley Powell WUJ:[id number] DOB: [birth year] DOA: 09/29/2023     15 DOS: the patient was seen and examined on 10/15/2023   Brief hospital course: Bradley Powell was admitted to the hospital with the working diagnosis of heart failure decompensation.   42 year old male with past medical history of paroxysmal atrial flutter s/p remote ablation, chronic combined systolic/diastolic CHF, NICM, OSA and morbid obesity. He presents to the ER with complaints of increasing fluid retention on his legs up to his stomach. Per patient his been compliant with his medications.   He has now been placed on a lasix gtt  Plan is to have TEE/DCCV when adequately diuresed.   11/29 continue to have severe volume overload.  Assessment and Plan: Acute on chronic systolic (congestive) heart failure (HCC) Echocardiogram with reduced LV systolic function with EF 35 to 40%, global hypokinesis, RV systolic function with moderate reduction, RV with mild enlargement, RVSP 39 mmHg. Right and left atrium with normal size.   Urine output is 3,100  ml Systolic blood pressure in the low 100's mmHg.  SV02 73.5   Diuresis with acetazolamide, empagliflozin, and spironolactone.  Furosemide IV drip 30 mg /hr  11/29 metolazone 5 mg.   To consider right heart catheterization for measure filling pressures.   Atrial fibrillation, chronic (HCC) Chronic atrial flutter.  Syncope with ventricular tachycardia.  Sp ICD 02/2022   Continue rate and rhythm control with amiodarone and mexiletine.  Anticoagulation with apixaban.    Acute kidney injury superimposed on chronic kidney disease (HCC) CKD stage 3b, Hyponatremia, hypokalemia.   Renal function with serum cr ata 2,4 with K at 3,3 and serum bicarbonate at 27  Na 131   Diuresis with acetazolamide, SGLT 2 inh, spironolactone IV  furosemide drip.    Continue aggressive K correction with KCL   Follow up renal function and electrolytes in am.     Hepatic cirrhosis (HCC) Continue diuresis with furosemide.  No signs of encephalopathy.   Gout Will continue with allopurinol and colchicine, he has high uric acid, and with high dose of loop diuretic he is in risk for acute flare.   Patient has osteoarthritis of the knees, will add topical diclofenac to right knee.   Obesity, class 2 Calculated BMI is 38.3   Leukocytosis Reactive leukocytosis,.  Patient completed 5 days of antibiotic therapy with cefepime.    Hypothyroidism Continue with levothyroxine.   Iron deficiency Serum iron of 28, TIBC 424, transferrin saturation 7 and ferritin 129. Plan to add IV iron during this hospitalization.  Follow up hgb is 13,0         Subjective: Patient with persistent edema and dyspnea., no chest pain.   Physical Exam: Vitals:   10/15/23 0033 10/15/23 0415 10/15/23 0436 10/15/23 0745  BP: 102/74 95/73  120/78  Pulse: 70 65  75  Resp: 18 20  18   Temp: (!) 97.5 F (36.4 C) (!) 97.3 F (36.3 C)  (!) 97.4 F (36.3 C)  TempSrc: Oral Oral  Oral  SpO2: 96% 99%  100%  Weight:   112.3 kg   Height:       Neurology awake and alert ENT with no pallor Cardiovascular with S1 and S2 present, irregularly irregular with no gallops, rubs or murmurs Positive JVD Positive lower extremity edema Respiratory with rales at bases with no wheezing or rhonchi Abdomen with no distention  Data Reviewed:    Family Communication: no family at the bedside   Disposition: Status is:  Inpatient Remains inpatient appropriate because: IV diuresis   Planned Discharge Destination: Home    Author: Coralie Keens, MD 10/15/2023 3:28 PM  For on call review [email or web address].

## 2023-10-15 NOTE — Progress Notes (Signed)
Cardiology Progress Note  Patient ID: JABRI STURDEVANT MRN: [id number] DOB: [birth year] Date of Encounter: 10/15/2023 Primary Cardiologist: None  Subjective   Chief Complaint: None.   HPI: Remains volume overloaded.  Diuresis interrupted due to hypokalemia yesterday.  No complaints this morning.  ROS:  All other ROS reviewed and negative. Pertinent positives noted in the HPI.     Telemetry  Overnight telemetry shows A-fib heart rate 70s, which I personally reviewed.    Physical Exam   Vitals:   10/15/23 0033 10/15/23 0415 10/15/23 0436 10/15/23 0745  BP: 102/74 95/73  120/78  Pulse: 70 65  75  Resp: 18 20  18   Temp: (!) 97.5 F (36.4 C) (!) 97.3 F (36.3 C)  (!) 97.4 F (36.3 C)  TempSrc: Oral Oral  Oral  SpO2: 96% 99%  100%  Weight:   112.3 kg   Height:        Intake/Output Summary (Last 24 hours) at 10/15/2023 0923 Last data filed at 10/15/2023 0600 Gross per 24 hour  Intake 1328.34 ml  Output 2700 ml  Net -1371.66 ml       10/15/2023    4:36 AM 10/14/2023    5:31 AM 10/13/2023    5:13 AM  Last 3 Weights  Weight (lbs) 247 lb 8 oz 246 lb 14.4 oz 252 lb 6.4 oz  Weight (kg) 112.265 kg 111.993 kg 114.488 kg    Body mass index is 37.63 kg/m.  General: Well nourished, well developed, in no acute distress Head: Atraumatic, normal size  Eyes: PEERLA, EOMI  Neck: Supple, JVD 15 to 17 cm of water Endocrine: No thryomegaly Cardiac: Normal S1, S2; irregular rhythm, no murmurs Lungs: Clear to auscultation bilaterally, no wheezing, rhonchi or rales  Abd: Soft, nontender, no hepatomegaly  Ext: 1+ pitting edema Musculoskeletal: No deformities, BUE and BLE strength normal and equal Skin: Warm and dry, no rashes   Neuro: Alert and oriented to person, place, time, and situation, CNII-XII grossly intact, no focal deficits  Psych: Normal mood and affect   Cardiac Studies  TTE 10/03/2023  1. Left ventricular ejection fraction, by estimation, is 35 to 40%. The  left  ventricle has moderately decreased function. The left ventricle  demonstrates global hypokinesis. Left ventricular diastolic parameters are  indeterminate.   2. Right ventricular systolic function is moderately reduced. The right  ventricular size is mildly enlarged. There is mildly elevated pulmonary  artery systolic pressure. The estimated right ventricular systolic  pressure is 39.0 mmHg.   3. The mitral valve is normal in structure. Trivial mitral valve  regurgitation. No evidence of mitral stenosis.   4. The aortic valve is tricuspid. Aortic valve regurgitation is not  visualized. Aortic valve sclerosis is present, with no evidence of aortic  valve stenosis. Aortic valve Vmax measures 1.10 m/s.   5. The inferior vena cava is dilated in size with <50% respiratory  variability, suggesting right atrial pressure of 15 mmHg.   Patient Profile  LEXX ETZEL is a 42 y.o. male with chronic systolic heart failure (LMNA cardiomyopathy), CKD stage IIIb, permanent atrial fibrillation, ventricular tachycardia, morbid obesity admitted on 09/29/2023 for acute on chronic systolic heart failure.   Assessment & Plan   # Acute on chronic systolic heart failure, EF 35-40% # LMNA cardiomyopathy # Hypokalemia -still volume up. Had to pause lasix yesterday due to hypokalemia.  Potassium is stable.  Restart Lasix drip at 30 mg/h.  I have ordered 60 of potassium to be taken 6  times daily.  Continue Aldactone 25 mg daily.  Continue Jardiance.  Close monitoring of electrolytes while on high-dose Lasix. -Not on Entresto due to hypotension.  Not on a beta-blocker due to bradycardia in the past.  Really intolerant of other afterload reduction.  Co. ox is stable.  Advanced heart failure team to follow back up tomorrow.  # Ventricular tachycardia -Continue mexiletine and amiodarone.  # CKD stage IIIb -Stable kidney function  # Hypokalemia -Replace as above  # Permanent atrial fibrillation -On  amiodarone.  Continue Eliquis.      For questions or updates, please contact [facility] Please consult [email or web address] for contact info under        Signed, Gerri Spore T. Flora Lipps, MD, [facility] [place]  [facility]  10/15/2023 9:23 AM

## 2023-10-15 NOTE — Plan of Care (Signed)
Plan of care reviewed. Pt is progressing. He is stable hemodynamically, afebrile, normal respiratory efforts and rate, no acute distress overnight. Atrial fib on the monitor with rate controlled. Started IV lasix gtt. Monitor CVP, I&O and electrolytes closely.   Problem: Clinical Measurements: Goal: Ability to maintain clinical measurements within normal limits will improve Outcome: Not Progressing Goal: Will remain free from infection Outcome: Progressing Goal: Diagnostic test results will improve Outcome: Not Progressing Goal: Cardiovascular complication will be avoided Outcome: Progressing   Problem: Activity: Goal: Risk for activity intolerance will decrease Outcome: Progressing   Problem: Cardiac: Goal: Ability to achieve and maintain adequate cardiopulmonary perfusion will improve Outcome: Progressing   Filiberto Pinks, RN

## 2023-10-16 ENCOUNTER — Inpatient Hospital Stay (HOSPITAL_COMMUNITY): Payer: 59

## 2023-10-16 DIAGNOSIS — I5021 Acute systolic (congestive) heart failure: Secondary | ICD-10-CM

## 2023-10-16 DIAGNOSIS — I5023 Acute on chronic systolic (congestive) heart failure: Secondary | ICD-10-CM | POA: Diagnosis not present

## 2023-10-16 DIAGNOSIS — I5043 Acute on chronic combined systolic (congestive) and diastolic (congestive) heart failure: Secondary | ICD-10-CM | POA: Diagnosis not present

## 2023-10-16 DIAGNOSIS — K7460 Unspecified cirrhosis of liver: Secondary | ICD-10-CM | POA: Diagnosis not present

## 2023-10-16 DIAGNOSIS — N179 Acute kidney failure, unspecified: Secondary | ICD-10-CM | POA: Diagnosis not present

## 2023-10-16 DIAGNOSIS — I482 Chronic atrial fibrillation, unspecified: Secondary | ICD-10-CM | POA: Diagnosis not present

## 2023-10-16 LAB — ECHOCARDIOGRAM LIMITED
Area-P 1/2: 6.71 cm2
Calc EF: 37.9 %
Height: 68 in
MV M vel: 3.99 m/s
MV Peak grad: 63.7 mm[Hg]
S' Lateral: 4.2 cm
Single Plane A2C EF: 43.3 %
Single Plane A4C EF: 37.5 %
Weight: 3987.2 [oz_av]

## 2023-10-16 LAB — BASIC METABOLIC PANEL
Anion gap: 7 (ref 5–15)
Anion gap: 12 (ref 5–15)
BUN: 44 mg/dL — ABNORMAL HIGH (ref 6–20)
BUN: 49 mg/dL — ABNORMAL HIGH (ref 6–20)
CO2: 25 mmol/L (ref 22–32)
CO2: 20 mmol/L — ABNORMAL LOW (ref 22–32)
Calcium: 8.4 mg/dL — ABNORMAL LOW (ref 8.9–10.3)
Calcium: 9.3 mg/dL (ref 8.9–10.3)
Chloride: 98 mmol/L (ref 98–111)
Chloride: 99 mmol/L (ref 98–111)
Creatinine, Ser: 2.44 mg/dL — ABNORMAL HIGH (ref 0.61–1.24)
Creatinine, Ser: 2.69 mg/dL — ABNORMAL HIGH (ref 0.61–1.24)
GFR, Estimated: 33 mL/min — ABNORMAL LOW (ref >60)
GFR, Estimated: 30 mL/min — ABNORMAL LOW (ref >60)
Glucose, Bld: 166 mg/dL — ABNORMAL HIGH (ref 70–99)
Glucose, Bld: 101 mg/dL — ABNORMAL HIGH (ref 70–99)
Potassium: 3.8 mmol/L (ref 3.5–5.1)
Potassium: 4.5 mmol/L (ref 3.5–5.1)
Sodium: 130 mmol/L — ABNORMAL LOW (ref 135–145)
Sodium: 131 mmol/L — ABNORMAL LOW (ref 135–145)

## 2023-10-16 LAB — CBC
HCT: 38.4 % — ABNORMAL LOW (ref 39.0–52.0)
Hemoglobin: 11.9 g/dL — ABNORMAL LOW (ref 13.0–17.0)
MCH: 26 pg (ref 26.0–34.0)
MCHC: 31 g/dL (ref 30.0–36.0)
MCV: 83.8 fL (ref 80.0–100.0)
Platelets: 344 10*3/uL (ref 150–400)
RBC: 4.58 MIL/uL (ref 4.22–5.81)
RDW: 16.6 % — ABNORMAL HIGH (ref 11.5–15.5)
WBC: 9.4 10*3/uL (ref 4.0–10.5)
nRBC: 0 % (ref 0.0–0.2)

## 2023-10-16 LAB — COOXEMETRY PANEL
Carboxyhemoglobin: 2 % — ABNORMAL HIGH (ref 0.5–1.5)
Methemoglobin: <0.7 % (ref 0.0–1.5)
O2 Saturation: 83.1 %
Total hemoglobin: 12.6 g/dL (ref 12.0–16.0)

## 2023-10-16 MED ORDER — POTASSIUM CHLORIDE CRYS ER 20 MEQ PO TBCR
60.0000 meq | EXTENDED_RELEASE_TABLET | Freq: Four times a day (QID) | ORAL | Status: DC
  Administered 2023-10-16: 60 meq via ORAL
  Filled 2023-10-16: qty 3

## 2023-10-16 MED ORDER — METOLAZONE 5 MG PO TABS
5.0000 mg | ORAL_TABLET | Freq: Once | ORAL | Status: AC
  Administered 2023-10-16: 5 mg via ORAL
  Filled 2023-10-16: qty 1

## 2023-10-16 NOTE — Plan of Care (Signed)
  Problem: Education: Goal: Knowledge of General Education information will improve Description: Including pain rating scale, medication(s)/side effects and non-pharmacologic comfort measures Outcome: Progressing   Problem: Health Behavior/Discharge Planning: Goal: Ability to manage health-related needs will improve Outcome: Progressing   Problem: Clinical Measurements: Goal: Ability to maintain clinical measurements within normal limits will improve Outcome: Progressing Goal: Will remain free from infection Outcome: Progressing Goal: Diagnostic test results will improve Outcome: Progressing Goal: Cardiovascular complication will be avoided Outcome: Progressing   Problem: Activity: Goal: Risk for activity intolerance will decrease Outcome: Progressing   Problem: Education: Goal: Ability to demonstrate management of disease process will improve Outcome: Progressing Goal: Ability to verbalize understanding of medication therapies will improve Outcome: Progressing Goal: Individualized Educational Video(s) Outcome: Progressing   Problem: Activity: Goal: Capacity to carry out activities will improve Outcome: Progressing   Problem: Cardiac: Goal: Ability to achieve and maintain adequate cardiopulmonary perfusion will improve Outcome: Progressing

## 2023-10-16 NOTE — Progress Notes (Signed)
Orthopedic Tech Progress Note Patient Details:  Bradley Powell [birth year] [id number]  Ortho Devices Type of Ortho Device: Radio broadcast assistant Ortho Device/Splint Location: Bi LE Ortho Device/Splint Interventions: Application   Post Interventions Patient Tolerated: Well  [name] 10/16/2023, 2:58 PM

## 2023-10-16 NOTE — Progress Notes (Addendum)
Advanced Heart Failure Rounding Note   Subjective:    Co-ox stable  . Only ~5L diuresed over the weekend. Diuresis paused with hypokalemia. KDUR replacement increased.   CVP 17  Down 54lb since admission. Up 2 lbs today.   sCr stable 2.44  Feels improvement finally. Denies CP/SOB. Has only been ambulating in his room. Encouraged to go out into the hall.   Objective:    Echo 11/19: EF 35-40%, LV with GHK, RV mod reduced, mildly elevated PASP, RVSP ~39, trivial MR   Weight Range:  Vital Signs:   Temp:  [97.2 F (36.2 C)-98.2 F (36.8 C)] 97.4 F (36.3 C) (12/02 0500) Pulse Rate:  [69-78] 78 (12/02 0500) Resp:  [18] 18 (12/02 0500) BP: (101-110)/(65-84) 110/84 (12/02 0500) SpO2:  [98 %-100 %] 100 % (12/02 0500) Weight:  [956 kg] 113 kg (12/02 0500) Last BM Date : 10/14/23  Weight change: Filed Weights   10/14/23 0531 10/15/23 0436 10/16/23 0500  Weight: 112 kg 112.3 kg 113 kg    Intake/Output:   Intake/Output Summary (Last 24 hours) at 10/16/2023 0832 Last data filed at 10/16/2023 0825 Gross per 24 hour  Intake 1546 ml  Output 2550 ml  Net -1004 ml     Physical Exam: CVP 17 General:  well appearing.  No respiratory difficulty HEENT: normal Neck: supple. JVD to ear. Carotids 2+ bilat; no bruits. No lymphadenopathy or thyromegaly appreciated. Cor: PMI nondisplaced. Regular rate & rhythm. No gallops or murmurs. Rub heard Lungs: clear, diminished bases Abdomen: soft, nontender, nondistended. No hepatosplenomegaly. No bruits or masses. Good bowel sounds. Extremities: no cyanosis, clubbing, rash, +2 BLE edema. + UNNA boots. PICC RUE Neuro: alert & oriented x 3, cranial nerves grossly intact. moves all 4 extremities w/o difficulty. Affect pleasant.   Telemetry: Afib/A-Flutter 80s (personally reviewed)  Labs: Basic Metabolic Panel: Recent Labs  Lab 10/10/23 0545 10/10/23 1431 10/11/23 0440 10/11/23 1300 10/12/23 0318 10/13/23 0300 10/13/23 1600  10/14/23 0500 10/14/23 1543 10/15/23 0431 10/16/23 0425  NA 133*   < > 132*   < > 129* 133* 132* 131* 132* 131* 130*  K 3.1*   < > 3.2*   < > 3.2* 3.5 3.0* 2.9* 3.6 3.3* 3.8  CL 93*   < > 92*   < > 89* 91* 90* 88* 91* 94* 98  CO2 28   < > 26   < > 27 26 31 31 26 27 25   GLUCOSE 98   < > 103*   < > 117* 122* 126* 124* 117* 124* 166*  BUN 44*   < > 48*   < > 47* 52* 50* 51* 50* 51* 44*  CREATININE 2.52*   < > 2.55*   < > 2.59* 2.61* 2.51* 2.48* 2.52* 2.40* 2.44*  CALCIUM 8.6*   < > 8.9   < > 9.0 8.9 8.9 9.2 9.0 9.1 8.4*  MG 2.2  --  2.4  --  2.5* 2.5*  --   --   --  2.8*  --   PHOS  --   --  3.7  --  3.8 4.2  --   --   --   --   --    < > = values in this interval not displayed.   Liver Function Tests: Recent Labs  Lab 10/12/23 0318 10/13/23 0300  AST 33 32  ALT 38 36  ALKPHOS 267* 276*  BILITOT 2.5* 2.2*  PROT 7.2 7.6  ALBUMIN 2.3* 2.4*  No results for input(s): "LIPASE", "AMYLASE" in the last 168 hours. No results for input(s): "AMMONIA" in the last 168 hours.  CBC: Recent Labs  Lab 10/12/23 0318 10/13/23 0300 10/14/23 0500 10/15/23 0431 10/16/23 0425  WBC 9.1 8.9 11.2* 9.3 9.4  NEUTROABS 6.6 6.5  --   --   --   HGB 13.1 13.3 13.0 12.5* 11.9*  HCT 40.8 40.9 40.8 40.1 38.4*  MCV 81.4 82.3 81.9 84.2 83.8  PLT 329 375 365 387 344   Cardiac Enzymes: No results for input(s): "CKTOTAL", "CKMB", "CKMBINDEX", "TROPONINI" in the last 168 hours.  BNP: BNP (last 3 results) Recent Labs    05/23/23 1618 09/30/23 0513 10/13/23 0300  BNP 209.9* 222.1* 285.0*   ProBNP (last 3 results) No results for input(s): "PROBNP" in the last 8760 hours.  Imaging: No results found.  Medications:    Scheduled Medications:  acetaZOLAMIDE  250 mg Oral BID   allopurinol  200 mg Oral Daily   amiodarone  200 mg Oral Daily   apixaban  5 mg Oral BID   Chlorhexidine Gluconate Cloth  6 each Topical Daily   colchicine  0.3 mg Oral Daily   diclofenac Sodium  2 g Topical QID    empagliflozin  10 mg Oral Daily   levothyroxine  50 mcg Oral Q0600   mexiletine  150 mg Oral Q12H   pantoprazole  40 mg Oral BID   potassium chloride  60 mEq Oral 6 X Daily   sodium chloride flush  10-40 mL Intracatheter Q12H   spironolactone  25 mg Oral Daily    Infusions:  furosemide (LASIX) 200 mg in dextrose 5 % 100 mL (2 mg/mL) infusion 30 mg/hr (10/16/23 0436)    PRN Medications: acetaminophen, baclofen, ondansetron (ZOFRAN) IV, oxyCODONE-acetaminophen, phenol, sodium chloride, sodium chloride flush  Assessment/Plan:  1. 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 Ratio 1.26 read as equivocal.  SPEP negative - cMRI repeated (11/22): LVEF 43%, RVEF 44%, LGE concerning for sarcoid.  - Echo (4/23): EF 35-40% Moderate RV dysfunction - Has seen geneticist, Dr. Jomarie Longs. Work up c/w LMNA.  - Admitted with NYHA IIIb symptoms and 40 pound weight gain - Echo 11/19 EF 35-40%, LV with GHK, RV mod reduced, mildly elevated PASP, RVSP ~39, trivial MR  - Remains volume overloaded with R>>L HF. Slow progress. CVP 17. - Continue lasix 30/hr. + Diamox 250 bid today. Weight up 2lbs. Add metolazone 5 mg x1.  - Continue Spiro to help with hypokalemia given stable sCr.  - Continue Jardiance 10 - Has been off b-blocker due to bradycardia and heart block - Off ARNi and ARB (hypotension and N/V in the past) - can rechallenge as able - Limited echo today. Rub auscultated on exam   2. Syncope/VT - 04/23 > secondary to VT - s/p ICD 4/23. - Continue mexiletine 150 mg bid   - Continue amiodarone 200 mg daiy - Followed by Dr. Ladona Ridgel  3. AKI on CKD Stage IIIb - Due to cardiorenal syndrome. Baseline SCr 1.7-2.0. Peaked 3.03 - Overall improving with diuresis. Stable 2.44 today. - Diuresis as above.    4. Chronic AF/AFL - S/p AFL ablation 5/19 with Dr. Ladona Ridgel. - Continue Eliquis 5 mg bid and amiodarone 200 daily  -  No role for DC-CV as he has been in AFL for a long time   5.  OSA: Continue CPAP  6. PVCs/VT - Continue mexiletine + amiodarone. - Followed by EP   7. Obesity - Body mass index is Body mass index is 37.89 kg/m. - Consider GLP1RA  8. Hypokalemia - K 3.8 - Continue Spiro - On high dose replacement. Continue to follow BMETs with diuresis  9. ID - per primary. WBC peaked at 21.6 with fever. No infective source identified.  - UA (-), PCXR (-), RVP (-), Bcx (-) - Completed Cefepime IV 5 day - WBC 9.6>13.7>11.5>8.9 off abx. Afebrile.    10. H/o Gout - R>L knee/leg swelling  - Uric acid 7.0  Length of Stay: 16  Alma L Diaz AGACNP-BC 10/16/2023, 8:32 AM  Advanced Heart Failure Team Pager [phone] (M-F; 7a - 4p)  Please contact [facility] for night-coverage after hours (4p -7a ) and weekends on [email or web address]   Patient seen with NP, agree with the above note.   Creatinine stable at 2.44.  I/Os net negative 1240 but diuresis slowed over the weekend due to hypokalemia.  K 3.8 today.  CVP 20-21 on my read.  Co-ox 83%.   No dyspnea at rest.   General: NAD Neck: JVP 16+, no thyromegaly or thyroid nodule.  Lungs: Clear to auscultation bilaterally with normal respiratory effort. CV: Nondisplaced PMI.  Heart regular S1/S2, no S3/S4, 2/6 SEM RUSB (probably murmur rather than friction rub).  Trace ankle edema.  Abdomen: Soft, nontender, no hepatosplenomegaly, no distention.  Skin: Intact without lesions or rashes.  Neurologic: Alert and oriented x 3.  Psych: Normal affect. Extremities: No clubbing or cyanosis.  HEENT: Normal.   Patient with LMNA cardiomyopathy, echo this admission with EF 35-40% and moderate RV dysfunction.  He is still markedly volume overloaded on exam (prominent RV failure) with CVP still 20-21.  Co-ox stable 83%. Creatinine stable at 2.44.  - Lasix gtt 30 mg/hr + metolazone 5 x 1 + acetazolamide 250 mg bid today.  - Replace K aggressively and give  spironolactone 25 mg daily. BMET in pm.  - Continue Jardiance 10 mg daily.   Atrial fibrillation/flutter is permanent, continue Eliquis.   Marca Ancona 10/16/2023 12:13 PM

## 2023-10-16 NOTE — Plan of Care (Signed)
  Problem: Education: Goal: Knowledge of General Education information will improve Description: Including pain rating scale, medication(s)/side effects and non-pharmacologic comfort measures Outcome: Progressing   Problem: Clinical Measurements: Goal: Will remain free from infection Outcome: Progressing   Problem: Activity: Goal: Risk for activity intolerance will decrease Outcome: Progressing   

## 2023-10-16 NOTE — Progress Notes (Signed)
Echocardiogram 2D Echocardiogram has been performed.  Lucendia Herrlich 10/16/2023, 3:03 PM

## 2023-10-16 NOTE — Progress Notes (Signed)
Attempted Echocardiogram, patient care is in progress. Will attempt at a later time.

## 2023-10-16 NOTE — Progress Notes (Signed)
Progress Note   Patient: Bradley Powell NGE:[id number] DOB: 1981/07/24 DOA: 09/29/2023     16 DOS: the patient was seen and examined on 10/16/2023   Brief hospital course: Mr. Brodeur was admitted to the hospital with the working diagnosis of heart failure decompensation.   42 year old male with past medical history of paroxysmal atrial flutter s/p remote ablation, chronic combined systolic/diastolic CHF, NICM, OSA and morbid obesity. He presents to the ER with complaints of increasing fluid retention on his legs up to his stomach. Per patient his been compliant with his medications. On his initial physical examination his blood pressure was 102/80 , HR 87, RR 22 and 02 saturation 100% , lungs with no wheezing or rales, heart with S1 and S2 present, irregularly irregular, with no gallops or rubs, abdomen with no distention but positive abdominal wall edema, positive lower extremity edema.   Chest radiograph with mild cardiomegaly, bilateral hilar vascular congestion and mild cephalization of the vasculature. Positive pacemaker defibrillator in place with one right ventricular lead.   Urine analysis SG 1,008, protein negative, pH 7.0, glucose >500, negative for leukocytes and negative Hgb.   He has now been placed on a lasix gtt for aggressive diuresis.  Plan is to have TEE/DCCV when adequately diuresed.   11/29 continue to have severe volume overload. 12/02 continue volume overloaded.   Assessment and Plan: Acute on chronic systolic (congestive) heart failure (HCC) Echocardiogram with reduced LV systolic function with EF 35 to 40%, global hypokinesis, RV systolic function with moderate reduction, RV with mild enlargement, RVSP 39 mmHg. Right and left atrium with normal size.   Urine output is 3,100  ml Systolic blood pressure in the low 100's mmHg.  SV02 83.1   Diuresis with acetazolamide, empagliflozin, and spironolactone.  Furosemide IV drip 30 mg /hr  11/29, 12/2  metolazone 5  mg.  Add fluid restriction to his diet, 1200 ml per day.   Atrial fibrillation, chronic (HCC) Chronic atrial flutter.  Syncope with ventricular tachycardia.  Sp ICD 02/2022   Continue rate and rhythm control with amiodarone and mexiletine.  Anticoagulation with apixaban.    Acute kidney injury superimposed on chronic kidney disease (HCC) CKD stage 3b, Hyponatremia, hypokalemia.   Renal function with serum cr ata 2,4 with K at 3,8 and serum bicarbonate at 25.  Na 130  Diuresis with acetazolamide, SGLT 2 inh, spironolactone IV  furosemide drip.    Continue aggressive K correction with KCL   Follow up renal function and electrolytes in am.    Hepatic cirrhosis (HCC) Continue diuresis with furosemide.  No signs of encephalopathy.   Gout Will continue with allopurinol and colchicine, he has high uric acid, and with high dose of loop diuretic he is in risk for acute flare.   Patient has osteoarthritis of the knees, will add topical diclofenac to right knee.   Obesity, class 2 Calculated BMI is 38.3   Leukocytosis Reactive leukocytosis,.  Patient completed 5 days of antibiotic therapy with cefepime.    Hypothyroidism Continue with levothyroxine.   Iron deficiency Serum iron of 28, TIBC 424, transferrin saturation 7 and ferritin 129. Plan to add IV iron during this hospitalization.  Follow up hgb is 13,0         Subjective: Patient with no chest pain or dyspnea, continue to have lower extremity edema, has improved but not back to baseline   Physical Exam: Vitals:   10/15/23 2025 10/16/23 0055 10/16/23 0500 10/16/23 1148  BP: 105/69 102/65 110/84 101/71  Pulse: 69 77 78 72  Resp: 18  18 18   Temp: 97.7 F (36.5 C) (!) 97.2 F (36.2 C) (!) 97.4 F (36.3 C) 98.2 F (36.8 C)  TempSrc: Oral Oral Oral Oral  SpO2: 100% 98% 100% 100%  Weight:   113 kg   Height:       Neurology awake and alert ENT with mild pallor Cardiovascular with S1 and S2 present,  irregularly irregular  with no gallops, rubs or murmurs Respiratory with mild rales at bases with no wheezing  Abdomen with no distention  Data Reviewed:    Family Communication: no family at the bedside   Disposition: Status is: Inpatient Remains inpatient appropriate because: heart failure   Planned Discharge Destination: Home     Author: Coralie Keens, MD 10/16/2023 4:00 PM  For on call review [email or web address].

## 2023-10-17 ENCOUNTER — Other Ambulatory Visit (HOSPITAL_COMMUNITY): Payer: Self-pay | Admitting: Family Medicine

## 2023-10-17 DIAGNOSIS — I5023 Acute on chronic systolic (congestive) heart failure: Secondary | ICD-10-CM | POA: Diagnosis not present

## 2023-10-17 DIAGNOSIS — N179 Acute kidney failure, unspecified: Secondary | ICD-10-CM | POA: Diagnosis not present

## 2023-10-17 DIAGNOSIS — I5043 Acute on chronic combined systolic (congestive) and diastolic (congestive) heart failure: Secondary | ICD-10-CM | POA: Diagnosis not present

## 2023-10-17 DIAGNOSIS — I482 Chronic atrial fibrillation, unspecified: Secondary | ICD-10-CM | POA: Diagnosis not present

## 2023-10-17 DIAGNOSIS — K7460 Unspecified cirrhosis of liver: Secondary | ICD-10-CM | POA: Diagnosis not present

## 2023-10-17 LAB — BASIC METABOLIC PANEL
Anion gap: 11 (ref 5–15)
Anion gap: 13 (ref 5–15)
BUN: 51 mg/dL — ABNORMAL HIGH (ref 6–20)
BUN: 52 mg/dL — ABNORMAL HIGH (ref 6–20)
CO2: 24 mmol/L (ref 22–32)
CO2: 27 mmol/L (ref 22–32)
Calcium: 8.6 mg/dL — ABNORMAL LOW (ref 8.9–10.3)
Calcium: 9.5 mg/dL (ref 8.9–10.3)
Chloride: 100 mmol/L (ref 98–111)
Chloride: 95 mmol/L — ABNORMAL LOW (ref 98–111)
Creatinine, Ser: 2.66 mg/dL — ABNORMAL HIGH (ref 0.61–1.24)
Creatinine, Ser: 2.58 mg/dL — ABNORMAL HIGH (ref 0.61–1.24)
GFR, Estimated: 30 mL/min — ABNORMAL LOW (ref >60)
GFR, Estimated: 31 mL/min — ABNORMAL LOW (ref >60)
Glucose, Bld: 131 mg/dL — ABNORMAL HIGH (ref 70–99)
Glucose, Bld: 116 mg/dL — ABNORMAL HIGH (ref 70–99)
Potassium: 2.6 mmol/L — CL (ref 3.5–5.1)
Potassium: 3.3 mmol/L — ABNORMAL LOW (ref 3.5–5.1)
Sodium: 135 mmol/L (ref 135–145)
Sodium: 135 mmol/L (ref 135–145)

## 2023-10-17 LAB — COOXEMETRY PANEL
Carboxyhemoglobin: 1.6 % — ABNORMAL HIGH (ref 0.5–1.5)
Methemoglobin: <0.7 % (ref 0.0–1.5)
O2 Saturation: 87.4 %
Total hemoglobin: 13.7 g/dL (ref 12.0–16.0)

## 2023-10-17 LAB — MAGNESIUM: Magnesium: 2.6 mg/dL — ABNORMAL HIGH (ref 1.7–2.4)

## 2023-10-17 MED ORDER — POTASSIUM CHLORIDE 10 MEQ/50ML IV SOLN
10.0000 meq | INTRAVENOUS | Status: AC
  Administered 2023-10-17 (×2): 10 meq via INTRAVENOUS
  Filled 2023-10-17 (×2): qty 50

## 2023-10-17 MED ORDER — METOLAZONE 5 MG PO TABS
5.0000 mg | ORAL_TABLET | Freq: Once | ORAL | Status: AC
  Administered 2023-10-17: 5 mg via ORAL
  Filled 2023-10-17: qty 1

## 2023-10-17 MED ORDER — SENNOSIDES-DOCUSATE SODIUM 8.6-50 MG PO TABS
1.0000 | ORAL_TABLET | Freq: Two times a day (BID) | ORAL | Status: DC
  Filled 2023-10-17 (×2): qty 1

## 2023-10-17 MED ORDER — POLYETHYLENE GLYCOL 3350 17 G PO PACK
17.0000 g | PACK | Freq: Every day | ORAL | Status: DC
  Filled 2023-10-17: qty 1

## 2023-10-17 MED ORDER — POTASSIUM CHLORIDE CRYS ER 20 MEQ PO TBCR
60.0000 meq | EXTENDED_RELEASE_TABLET | Freq: Every day | ORAL | Status: DC
  Administered 2023-10-17 – 2023-10-20 (×18): 60 meq via ORAL
  Filled 2023-10-17 (×18): qty 3

## 2023-10-17 MED ORDER — ACETAZOLAMIDE 250 MG PO TABS
250.0000 mg | ORAL_TABLET | Freq: Once | ORAL | Status: AC
  Administered 2023-10-17: 250 mg via ORAL
  Filled 2023-10-17: qty 1

## 2023-10-17 MED ORDER — POTASSIUM CHLORIDE 10 MEQ/50ML IV SOLN
10.0000 meq | INTRAVENOUS | Status: AC
  Administered 2023-10-17 (×4): 10 meq via INTRAVENOUS
  Filled 2023-10-17 (×4): qty 50

## 2023-10-17 MED ORDER — ACETAZOLAMIDE 250 MG PO TABS
250.0000 mg | ORAL_TABLET | Freq: Two times a day (BID) | ORAL | Status: DC
  Administered 2023-10-18 – 2023-10-19 (×3): 250 mg via ORAL
  Filled 2023-10-17 (×3): qty 1

## 2023-10-17 NOTE — Plan of Care (Signed)
  Problem: Education: Goal: Knowledge of General Education information will improve Description: Including pain rating scale, medication(s)/side effects and non-pharmacologic comfort measures Outcome: Progressing   Problem: Clinical Measurements: Goal: Will remain free from infection Outcome: Progressing   Problem: Clinical Measurements: Goal: Diagnostic test results will improve Outcome: Progressing   Problem: Clinical Measurements: Goal: Cardiovascular complication will be avoided Outcome: Progressing

## 2023-10-17 NOTE — Progress Notes (Signed)
Progress Note   Patient: Bradley Powell ZOX:[id number] DOB: Apr 08, 1981 DOA: 09/29/2023     17 DOS: the patient was seen and examined on 10/17/2023   Brief hospital course: Bradley Powell was admitted to the hospital with the working diagnosis of heart failure decompensation.   42 year old male with past medical history of paroxysmal atrial flutter s/p remote ablation, chronic combined systolic/diastolic CHF, NICM, OSA and morbid obesity. He presents to the ER with complaints of increasing fluid retention on his legs up to his stomach. Per patient his been compliant with his medications. On his initial physical examination his blood pressure was 102/80 , HR 87, RR 22 and 02 saturation 100% , lungs with no wheezing or rales, heart with S1 and S2 present, irregularly irregular, with no gallops or rubs, abdomen with no distention but positive abdominal wall edema, positive lower extremity edema.   Chest radiograph with mild cardiomegaly, bilateral hilar vascular congestion and mild cephalization of the vasculature. Positive pacemaker defibrillator in place with one right ventricular lead.   Urine analysis SG 1,008, protein negative, pH 7.0, glucose >500, negative for leukocytes and negative Hgb.   He has now been placed on a lasix gtt for aggressive diuresis.  Plan is to have TEE/DCCV when adequately diuresed.   11/29 continue to have severe volume overload. 12/02 continue volume overloaded.  12/03 continue with aggressive diuresis   Assessment and Plan: Acute on chronic systolic (congestive) heart failure (HCC) Echocardiogram with reduced LV systolic function with EF 35 to 40%, global hypokinesis, RV systolic function with moderate reduction, RV with mild enlargement, RVSP 39 mmHg. Right and left atrium with normal size.   Urine output is 7,500  ml Systolic blood pressure in the low 100's mmHg.  SV02 87.4   Diuresis with acetazolamide, empagliflozin, and spironolactone.  Furosemide IV  drip 30 mg /hr  11/29, 12/2. 12/5   metolazone 5 mg.  Fluid restriction 1500 ml per day.   Atrial fibrillation, chronic (HCC) Chronic atrial flutter.  Syncope with ventricular tachycardia.  Sp ICD 02/2022   Continue rate and rhythm control with amiodarone and mexiletine.  Anticoagulation with apixaban.    Acute kidney injury superimposed on chronic kidney disease (HCC) CKD stage 3b, Hyponatremia, hypokalemia.   Renal function today with serum cr at 2,58 with K at 3,3 and serum bicarbonate at 27  Na 135   Diuresis with acetazolamide, SGLT 2 inh, spironolactone IV  furosemide drip.  Continue aggressive K correction with KCL   Follow up renal function and electrolytes in am.    Hepatic cirrhosis (HCC) Continue diuresis with furosemide.  No signs of encephalopathy.   Gout Will continue with allopurinol and colchicine, he has high uric acid, and with high dose of loop diuretic he is in risk for acute flare.   Patient has osteoarthritis of the knees, will add topical diclofenac to right knee.   Obesity, class 2 Calculated BMI is 38.3   Leukocytosis Reactive leukocytosis,.  Patient completed 5 days of antibiotic therapy with cefepime.    Hypothyroidism Continue with levothyroxine.   Iron deficiency Serum iron of 28, TIBC 424, transferrin saturation 7 and ferritin 129. Plan to add IV iron during this hospitalization.  Follow up hgb is 13,0         Subjective: Patient is feeling better and edema is improving but not back to baseline   Physical Exam: Vitals:   10/17/23 0428 10/17/23 0806 10/17/23 1100 10/17/23 1700  BP: (!) 99/58 105/62 101/66 110/64  Pulse:  75 70 73   Resp: 18 18 18 18   Temp: 97.6 F (36.4 C) 97.6 F (36.4 C) (!) 96.8 F (36 C)   TempSrc: Oral Oral Oral   SpO2: 100% 96% 100% 100%  Weight: 110.5 kg     Height:       Neurology awake and alert ENT with no pallor Cardiovascular with S1 and S2 present and regular with no gallops or  rubs Respiratory with mild rales at bases with no wheezing Abdomen with no distention (protuberant) Positive lower extremity edema ++ unna boots in place  Data Reviewed:    Family Communication: no family at the bedside   Disposition: Status is: Inpatient Remains inpatient appropriate because: IV diuresis   Planned Discharge Destination: Home     Author: Coralie Keens, MD 10/17/2023 5:59 PM  For on call review [email or web address].

## 2023-10-17 NOTE — Progress Notes (Signed)
Lab called for critical result of Potassium 2.6. MD notified.

## 2023-10-17 NOTE — TOC Progression Note (Signed)
Transition of Care [facility]) - Progression Note    Patient Details  Name: Bradley Powell MRN: [id number] Date of Birth: 03/21/1981  Transition of Care [facility]) CM/SW Contact  Nicanor Bake Phone Number: [phone] 10/17/2023, 3:24 PM  Clinical Narrative:   HF CSW attempted to go to the pts room, but did not see a nurse to inquire about the sign on the door. CSW spoke with pt over the phone. Pt stated that he is doing ok and was resting at the time. Pt stated that he did not need anything at this time. CSW will continue to follow up with pt.   TOC will continue following.     Expected Discharge Plan: Home/Self Care Barriers to Discharge: Continued Medical Work up  Expected Discharge Plan and Services       Living arrangements for the past 2 months: Single Family Home                                       Social Determinants of Health (SDOH) Interventions SDOH Screenings   Food Insecurity: No Food Insecurity (09/30/2023)  Housing: Low Risk  (09/30/2023)  Transportation Needs: No Transportation Needs (09/30/2023)  Utilities: Not At Risk (09/30/2023)  Tobacco Use: Low Risk  (09/29/2023)    Readmission Risk Interventions     No data to display

## 2023-10-17 NOTE — Progress Notes (Signed)
TRH night cross cover note:   I was notified by RN of the patient's morning potassium level of 2.6.  Per brief chart review, the patient has existing orders for ongoing oral potassium supplementation.  No new orders from my standpoint at this time.     Newton Pigg, DO Hospitalist

## 2023-10-17 NOTE — Progress Notes (Addendum)
Advanced Heart Failure Rounding Note   Subjective:    Co-ox high/stable  . Only -7.5L UOP yesterday.   CVP 12  Down 60lb since admission. Down 6lbs today  sCr stable 2.66. K 2.6  Upset about the fluid restriction this morning. Denies CP/SOB.   Objective:    Echo 11/19: EF 35-40%, LV with GHK, RV mod reduced, mildly elevated PASP, RVSP ~39, trivial MR   Weight Range:  Vital Signs:   Temp:  [97.2 F (36.2 C)-98.2 F (36.8 C)] 97.6 F (36.4 C) (12/03 0428) Pulse Rate:  [66-85] 75 (12/03 0428) Resp:  [18] 18 (12/03 0428) BP: (95-101)/(58-71) 99/58 (12/03 0428) SpO2:  [100 %] 100 % (12/03 0428) Weight:  [110.5 kg] 110.5 kg (12/03 0428) Last BM Date : 10/14/23  Weight change: Filed Weights   10/16/23 0500 10/17/23 0300 10/17/23 0428  Weight: 113 kg 110.5 kg 110.5 kg    Intake/Output:   Intake/Output Summary (Last 24 hours) at 10/17/2023 0756 Last data filed at 10/17/2023 0700 Gross per 24 hour  Intake 1436 ml  Output 7500 ml  Net -6064 ml     Physical Exam: CVP 12 General:  well appearing.  No respiratory difficulty HEENT: normal Neck: supple. JVD ~14 cm. Carotids 2+ bilat; no bruits. No lymphadenopathy or thyromegaly appreciated. Cor: PMI nondisplaced. Regular rate & irregular rhythm. No rubs, gallops or murmurs. Lungs: clear Abdomen: obese, soft, nontender, nondistended. No hepatosplenomegaly. No bruits or masses. Good bowel sounds. Extremities: no cyanosis, clubbing, rash, +1 BLE edema to knee. +UNNA boots. PICC RUE Neuro: alert & oriented x 3, cranial nerves grossly intact. moves all 4 extremities w/o difficulty. Affect pleasant.   Telemetry: Afib/A-Flutter 60s (personally reviewed)  Labs: Basic Metabolic Panel: Recent Labs  Lab 10/11/23 0440 10/11/23 1300 10/12/23 0318 10/13/23 0300 10/13/23 1600 10/14/23 1543 10/15/23 0431 10/16/23 0425 10/16/23 1400 10/17/23 0502  NA 132*   < > 129* 133*   < > 132* 131* 130* 131* 135  K 3.2*   < >  3.2* 3.5   < > 3.6 3.3* 3.8 4.5 2.6*  CL 92*   < > 89* 91*   < > 91* 94* 98 99 100  CO2 26   < > 27 26   < > 26 27 25  20* 24  GLUCOSE 103*   < > 117* 122*   < > 117* 124* 166* 101* 131*  BUN 48*   < > 47* 52*   < > 50* 51* 44* 49* 51*  CREATININE 2.55*   < > 2.59* 2.61*   < > 2.52* 2.40* 2.44* 2.69* 2.66*  CALCIUM 8.9   < > 9.0 8.9   < > 9.0 9.1 8.4* 9.3 8.6*  MG 2.4  --  2.5* 2.5*  --   --  2.8*  --   --  2.6*  PHOS 3.7  --  3.8 4.2  --   --   --   --   --   --    < > = values in this interval not displayed.   Liver Function Tests: Recent Labs  Lab 10/12/23 0318 10/13/23 0300  AST 33 32  ALT 38 36  ALKPHOS 267* 276*  BILITOT 2.5* 2.2*  PROT 7.2 7.6  ALBUMIN 2.3* 2.4*    No results for input(s): "LIPASE", "AMYLASE" in the last 168 hours. No results for input(s): "AMMONIA" in the last 168 hours.  CBC: Recent Labs  Lab 10/12/23 0318 10/13/23 0300 10/14/23 0500 10/15/23  0431 10/16/23 0425  WBC 9.1 8.9 11.2* 9.3 9.4  NEUTROABS 6.6 6.5  --   --   --   HGB 13.1 13.3 13.0 12.5* 11.9*  HCT 40.8 40.9 40.8 40.1 38.4*  MCV 81.4 82.3 81.9 84.2 83.8  PLT 329 375 365 387 344   Cardiac Enzymes: No results for input(s): "CKTOTAL", "CKMB", "CKMBINDEX", "TROPONINI" in the last 168 hours.  BNP: BNP (last 3 results) Recent Labs    05/23/23 1618 09/30/23 0513 10/13/23 0300  BNP 209.9* 222.1* 285.0*   ProBNP (last 3 results) No results for input(s): "PROBNP" in the last 8760 hours.  Imaging: ECHOCARDIOGRAM LIMITED  Result Date: 10/16/2023    ECHOCARDIOGRAM LIMITED REPORT   Patient Name:   Bradley Powell [facility] Date of Exam: 10/16/2023 Medical Rec #:  [id number]          Height:       68.0 in Accession #:    [id number]         Weight:       249.2 lb Date of Birth:  Jul 09, 1981         BSA:          2.244 m Patient Age:    42 years           BP:           101/71 mmHg Patient Gender: M                  HR:           75 bpm. Exam Location:  Inpatient Procedure: Limited Echo and Limited  Color Doppler Indications:    CHF- Acute Systolic I50.21  History:        Patient has prior history of Echocardiogram examinations, most                 recent 10/03/2023. Cardiomyopathy and CHF, Defibrillator,                 Arrythmias:Atrial Fibrillation, Atrial Flutter, PVC and                 Tachycardia, Signs/Symptoms:Hypotension and Syncope; Risk                 Factors:Sleep Apnea.  Sonographer:    Lucendia Herrlich RCS Referring Phys: [phone] ALMA L DIAZ IMPRESSIONS  1. Limited study for LV function  2. Left ventricular ejection fraction, by estimation, is 35 to 40%. Left ventricular ejection fraction by 2D MOD biplane is 37.9 %. The left ventricle has moderately decreased function. The left ventricle demonstrates global hypokinesis. There is mild left ventricular hypertrophy.  3. The mitral valve is abnormal. Mild mitral valve regurgitation.  4. Tricuspid valve regurgitation is moderate.  5. Right ventricular systolic function is mildly reduced. The right ventricular size is normal. There is normal pulmonary artery systolic pressure. The estimated right ventricular systolic pressure is 35.2 mmHg.  6. The inferior vena cava is dilated in size with <50% respiratory variability, suggesting right atrial pressure of 15 mmHg.  7. Rhythm strip during this exam demonstrates atrial flutter. Comparison(s): No significant change from prior study. 10/03/2023: LVEF 35-40%. FINDINGS  Left Ventricle: Left ventricular ejection fraction, by estimation, is 35 to 40%. Left ventricular ejection fraction by 2D MOD biplane is 37.9 %. The left ventricle has moderately decreased function. The left ventricle demonstrates global hypokinesis. The left ventricular internal cavity size was normal in size. There is mild left ventricular hypertrophy. Right Ventricle: The right ventricular  size is normal. No increase in right ventricular wall thickness. Right ventricular systolic function is mildly reduced. There is normal pulmonary  artery systolic pressure. The tricuspid regurgitant velocity is 2.25 m/s, and with an assumed right atrial pressure of 15 mmHg, the estimated right ventricular systolic pressure is 35.2 mmHg. Pericardium: Trivial pericardial effusion is present. The pericardial effusion is circumferential. Mitral Valve: The mitral valve is abnormal. There is moderate thickening of the anterior and posterior mitral valve leaflet(s). Mild mitral valve regurgitation, with posteriorly-directed jet. Tricuspid Valve: The tricuspid valve is grossly normal. Tricuspid valve regurgitation is moderate. Pulmonic Valve: The pulmonic valve was grossly normal. Pulmonic valve regurgitation is trivial. Aorta: The aortic root and ascending aorta are structurally normal, with no evidence of dilitation. Venous: The inferior vena cava is dilated in size with less than 50% respiratory variability, suggesting right atrial pressure of 15 mmHg. EKG: Rhythm strip during this exam demonstrates atrial flutter. Additional Comments: A device lead is visualized.  LEFT VENTRICLE PLAX 2D                        Biplane EF (MOD) LVIDd:         5.10 cm         LV Biplane EF:   Left LVIDs:         4.20 cm                          ventricular LV PW:         1.20 cm                          ejection LV IVS:        0.90 cm                          fraction by LVOT diam:     2.00 cm                          2D MOD LVOT Area:     3.14 cm                         biplane is                                                 37.9 %.  LV Volumes (MOD)               Diastology LV vol d, MOD    110.0 ml      LV e' medial:    8.81 cm/s A2C:                           LV E/e' medial:  10.7 LV vol d, MOD    110.0 ml      LV e' lateral:   8.92 cm/s A4C:                           LV E/e' lateral: 10.6 LV vol s, MOD    62.4 ml A2C: LV vol s, MOD    68.7 ml A4C:  LV SV MOD A2C:   47.6 ml LV SV MOD A4C:   110.0 ml LV SV MOD BP:    41.7 ml RIGHT VENTRICLE            IVC RV S prime:     8.86  cm/s  IVC diam: 2.80 cm TAPSE (M-mode): 1.3 cm LEFT ATRIUM         Index LA diam:    5.10 cm 2.27 cm/m   AORTA Ao Root diam: 2.60 cm Ao Asc diam:  2.20 cm MITRAL VALVE               TRICUSPID VALVE MV Area (PHT): 6.71 cm    TR Peak grad:   20.2 mmHg MV Decel Time: 113 msec    TR Vmax:        225.00 cm/s MR Peak grad: 63.7 mmHg MR Vmax:      399.00 cm/s  SHUNTS MV E velocity: 94.70 cm/s  Systemic Diam: 2.00 cm MV A velocity: 53.40 cm/s MV E/A ratio:  1.77 Zoila Shutter MD Electronically signed by Zoila Shutter MD Signature Date/Time: 10/16/2023/5:16:29 PM    Final     Medications:    Scheduled Medications:  acetaZOLAMIDE  250 mg Oral BID   allopurinol  200 mg Oral Daily   amiodarone  200 mg Oral Daily   apixaban  5 mg Oral BID   Chlorhexidine Gluconate Cloth  6 each Topical Daily   colchicine  0.3 mg Oral Daily   diclofenac Sodium  2 g Topical QID   empagliflozin  10 mg Oral Daily   levothyroxine  50 mcg Oral Q0600   mexiletine  150 mg Oral Q12H   pantoprazole  40 mg Oral BID   potassium chloride  60 mEq Oral 6 X Daily   sodium chloride flush  10-40 mL Intracatheter Q12H   spironolactone  25 mg Oral Daily    Infusions:  furosemide (LASIX) 200 mg in dextrose 5 % 100 mL (2 mg/mL) infusion 30 mg/hr (10/17/23 0630)   potassium chloride      PRN Medications: acetaminophen, baclofen, ondansetron (ZOFRAN) IV, oxyCODONE-acetaminophen, phenol, sodium chloride, sodium chloride flush  Assessment/Plan:  1. 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 Ratio 1.26 read as equivocal.  SPEP negative - cMRI repeated (11/22): LVEF 43%, RVEF 44%, LGE concerning for sarcoid.  - Echo (4/23): EF 35-40% Moderate RV dysfunction - Has seen geneticist, Dr. Jomarie Longs. Work up c/w LMNA.  - Admitted with NYHA IIIb symptoms and 40 pound weight gain - Echo 11/19 EF 35-40%, LV with GHK, RV mod reduced, mildly elevated PASP, RVSP ~39,  trivial MR  - Limited echo yesterday EF 35-40%, LV with GHK, mild LVH, mild MR, mod TR, RV mildly reduced. Trivial circumferential pericardial effusion present.  - Remains volume overloaded with R>>L HF. Slow progress. CVP 12. - Continue lasix 30/hr. Holding diamox and metolazone until K stabilizes. Plan to at least repeat diamox late today. CVP improving.  - Continue Spiro to help with hypokalemia given stable sCr.  - Continue Jardiance 10 - Has been off b-blocker due to bradycardia and heart block - Off ARNi and ARB (hypotension and N/V in the past) - can rechallenge as able   2. Syncope/VT - 04/23 > secondary to VT - s/p ICD 4/23. - Continue mexiletine 150 mg bid   - Continue amiodarone 200 mg daiy - Followed by  Dr. Ladona Ridgel  3. AKI on CKD Stage IIIb - Due to cardiorenal syndrome. Baseline SCr 1.7-2.0. Peaked 3.03 - Overall improving with diuresis. Stable 2.44 today. - Diuresis as above.    4. Chronic AF/AFL - S/p AFL ablation 5/19 with Dr. Ladona Ridgel. - Continue Eliquis 5 mg bid and amiodarone 200 daily  - No role for DC-CV as he has been in AFL for a long time   5.  OSA: Continue CPAP   6. PVCs/VT - Continue mexiletine + amiodarone. - Followed by EP   7. Obesity - Body mass index is Body mass index is 37.02 kg/m. - Consider GLP1RA  8. Hypokalemia - K 2.6 this morning - Continue Spiro - On high dose replacement. Continue to follow BMETs with diuresis. Will aggressively replete today  9. ID - per primary. WBC peaked at 21.6 with fever. No infective source identified.  - UA (-), PCXR (-), RVP (-), Bcx (-) - Completed Cefepime IV 5 day - WBC 9.6>13.7>11.5>8.9 off abx. Afebrile.    10. H/o Gout - R>L knee/leg swelling  - Uric acid 7.0  Length of Stay: 17  Alma L Diaz AGACNP-BC 10/17/2023, 7:56 AM  Advanced Heart Failure Team Pager [phone] (M-F; 7a - 4p)  Please contact [facility] for night-coverage after hours (4p -7a ) and weekends on [email or web address]   Patient  seen with NP, agree with the above note.   Weight down 6 lbs, excellent diuresis.  Still having trouble keeping up with K, held off on metolazone/Diamox this morning and replaced K, now up to 3.3.  CVP around 14-15.  Creatinine trending down, 2.66 => 2.58.  Co-ox excellent.   General: NAD Neck: JVP 14 cm, no thyromegaly or thyroid nodule.  Lungs: Clear to auscultation bilaterally with normal respiratory effort. CV: Nondisplaced PMI.  Heart regular S1/S2, no S3/S4, no murmur.  1+ edema to knees.   Abdomen: Soft, nontender, no hepatosplenomegaly, no distention.  Skin: Intact without lesions or rashes.  Neurologic: Alert and oriented x 3.  Psych: Normal affect. Extremities: No clubbing or cyanosis.  HEENT: Normal.   Patient with LMNA cardiomyopathy, echo this admission with EF 35-40% and moderate RV dysfunction.  He is still volume overloaded on exam (prominent RV failure) with CVP better at around 15.  Good diuresis yesterday, weight down 6 lbs.  Good co-ox.  Creatinine mildly lower at 2.58. - Lasix gtt 30 mg/hr.  - K up to 3.3, continue to replace aggressively.  - He can get a dose of metolazone 5 mg and acetazolamide 250 mg this afternoon.  - Continue spironolactone 25 mg daily  - Continue Jardiance 10 mg daily.    Atrial fibrillation/flutter is permanent, continue Eliquis.   Marca Ancona 10/17/2023 3:13 PM

## 2023-10-18 DIAGNOSIS — I5043 Acute on chronic combined systolic (congestive) and diastolic (congestive) heart failure: Secondary | ICD-10-CM | POA: Diagnosis not present

## 2023-10-18 LAB — BASIC METABOLIC PANEL
Anion gap: 10 (ref 5–15)
BUN: 51 mg/dL — ABNORMAL HIGH (ref 6–20)
CO2: 27 mmol/L (ref 22–32)
Calcium: 9.1 mg/dL (ref 8.9–10.3)
Chloride: 97 mmol/L — ABNORMAL LOW (ref 98–111)
Creatinine, Ser: 2.45 mg/dL — ABNORMAL HIGH (ref 0.61–1.24)
GFR, Estimated: 33 mL/min — ABNORMAL LOW (ref >60)
Glucose, Bld: 118 mg/dL — ABNORMAL HIGH (ref 70–99)
Potassium: 3 mmol/L — ABNORMAL LOW (ref 3.5–5.1)
Sodium: 134 mmol/L — ABNORMAL LOW (ref 135–145)

## 2023-10-18 LAB — COOXEMETRY PANEL
Carboxyhemoglobin: 1.8 % — ABNORMAL HIGH (ref 0.5–1.5)
Methemoglobin: 0.7 % (ref 0.0–1.5)
O2 Saturation: 78.9 %
Total hemoglobin: 14.1 g/dL (ref 12.0–16.0)

## 2023-10-18 LAB — MAGNESIUM: Magnesium: 2.7 mg/dL — ABNORMAL HIGH (ref 1.7–2.4)

## 2023-10-18 MED ORDER — POLYETHYLENE GLYCOL 3350 17 G PO PACK: 17.0000 g | PACK | Freq: Every day | ORAL | Status: DC | PRN

## 2023-10-18 MED ORDER — POTASSIUM CHLORIDE 10 MEQ/50ML IV SOLN
10.0000 meq | INTRAVENOUS | Status: AC
  Administered 2023-10-18 (×2): 10 meq via INTRAVENOUS
  Filled 2023-10-18 (×2): qty 50

## 2023-10-18 MED ORDER — METOLAZONE 5 MG PO TABS
5.0000 mg | ORAL_TABLET | Freq: Once | ORAL | Status: AC
  Administered 2023-10-18: 5 mg via ORAL
  Filled 2023-10-18: qty 1

## 2023-10-18 NOTE — Progress Notes (Signed)
PROGRESS NOTE    Bradley Powell  BJY:[id number] DOB: 06-15-1981 DOA: 09/29/2023 PCP: Bradley Dills, MD  42/M chronic combined CHF, NICM, OSA, obesity, paroxysmal A-fib, to the ED with worsening edema, shortness of breath despite compliance with medications.  In the ED he was massively volume overloaded  -Chest x-ray noted cardiomegaly pulmonary vascular congestion  -CHF team following, on Lasix drip, improving  Subjective: Feels okay overall, swelling much improved  Assessment and Plan:  Acute on chronic combined CHF, predominantly RV failure -Echo noted EF of 35-40%, moderately reduced RV  -Diuresed aggressively with IV Lasix, weight down over 65 LB  -Heart failure team following, on Lasix gtt., Diamox, Aldactone, Jardiance   Atrial fibrillation, chronic (HCC), history of VT Syncope with ventricular tachycardia.  Sp ICD 02/2022  -On amiodarone and mexiletine  Acute kidney injury superimposed on chronic kidney disease (HCC) CKD stage 3b, Hyponatremia, hypokalemia.  Baseline creatinine 1.7-2 range -Peaked at 3.0, now improving  Hepatic cirrhosis (HCC) Continue diuretics as above No signs of encephalopathy.   Gout Will continue with allopurinol and colchicine, he has high uric acid, and with high dose of loop diuretic he is in risk for acute flare.  -Also with osteoarthritis of the knees, on topical diclofenac  Obesity, class 2 Calculated BMI is 38.3   Leukocytosis Reactive leukocytosis,.  Patient completed 5 days of antibiotic therapy with cefepime.  No clear source of infection identified  Hypothyroidism Continue with levothyroxine.   Iron deficiency Serum iron of 28, TIBC 424, transferrin saturation 7 and ferritin 129. Plan to add IV iron during this hospitalization.  Follow up hgb is 13,0   DVT prophylaxis: Apixaban Code Status: Full code Family Communication: None present Disposition Plan: Home likely 2 to 3 days  Consultants:    Procedures:    Antimicrobials:    Objective: Vitals:   10/18/23 0500 10/18/23 0741 10/18/23 1000 10/18/23 1053  BP:  100/68 98/63 109/76  Pulse:  90 69 73  Resp:  17 16 18   Temp:  98.1 F (36.7 C)  98.3 F (36.8 C)  TempSrc:  Oral  Oral  SpO2:  100% 100% 98%  Weight: 106.6 kg     Height:        Intake/Output Summary (Last 24 hours) at 10/18/2023 1208 Last data filed at 10/18/2023 1106 Gross per 24 hour  Intake 705 ml  Output 7652 ml  Net -6947 ml   Filed Weights   10/17/23 0300 10/17/23 0428 10/18/23 0500  Weight: 110.5 kg 110.5 kg 106.6 kg    Examination:  General exam: Obese chronically ill male sitting up in bed, AAOx3 HEENT: Positive JVD CVS: S1-S2, irregular rhythm Lungs: Clear bilaterally Abdomen: Soft, nontender, bowel sounds present Extremities: 1+ edema  Psychiatry:  Mood & affect appropriate.     Data Reviewed:   CBC: Recent Labs  Lab 10/12/23 0318 10/13/23 0300 10/14/23 0500 10/15/23 0431 10/16/23 0425  WBC 9.1 8.9 11.2* 9.3 9.4  NEUTROABS 6.6 6.5  --   --   --   HGB 13.1 13.3 13.0 12.5* 11.9*  HCT 40.8 40.9 40.8 40.1 38.4*  MCV 81.4 82.3 81.9 84.2 83.8  PLT 329 375 365 387 344   Basic Metabolic Panel: Recent Labs  Lab 10/12/23 0318 10/13/23 0300 10/13/23 1600 10/15/23 0431 10/16/23 0425 10/16/23 1400 10/17/23 0502 10/17/23 1417 10/18/23 0412  NA 129* 133*   < > 131* 130* 131* 135 135 134*  K 3.2* 3.5   < > 3.3* 3.8 4.5 2.6* 3.3*  3.0*  CL 89* 91*   < > 94* 98 99 100 95* 97*  CO2 27 26   < > 27 25 20* 24 27 27   GLUCOSE 117* 122*   < > 124* 166* 101* 131* 116* 118*  BUN 47* 52*   < > 51* 44* 49* 51* 52* 51*  CREATININE 2.59* 2.61*   < > 2.40* 2.44* 2.69* 2.66* 2.58* 2.45*  CALCIUM 9.0 8.9   < > 9.1 8.4* 9.3 8.6* 9.5 9.1  MG 2.5* 2.5*  --  2.8*  --   --  2.6*  --  2.7*  PHOS 3.8 4.2  --   --   --   --   --   --   --    < > = values in this interval not displayed.   GFR: Estimated Creatinine Clearance: 47.4 mL/min (A) (by C-G formula based  on SCr of 2.45 mg/dL (H)). Liver Function Tests: Recent Labs  Lab 10/12/23 0318 10/13/23 0300  AST 33 32  ALT 38 36  ALKPHOS 267* 276*  BILITOT 2.5* 2.2*  PROT 7.2 7.6  ALBUMIN 2.3* 2.4*   No results for input(s): "LIPASE", "AMYLASE" in the last 168 hours. No results for input(s): "AMMONIA" in the last 168 hours. Coagulation Profile: No results for input(s): "INR", "PROTIME" in the last 168 hours. Cardiac Enzymes: No results for input(s): "CKTOTAL", "CKMB", "CKMBINDEX", "TROPONINI" in the last 168 hours. BNP (last 3 results) No results for input(s): "PROBNP" in the last 8760 hours. HbA1C: No results for input(s): "HGBA1C" in the last 72 hours. CBG: No results for input(s): "GLUCAP" in the last 168 hours. Lipid Profile: No results for input(s): "CHOL", "HDL", "LDLCALC", "TRIG", "CHOLHDL", "LDLDIRECT" in the last 72 hours. Thyroid Function Tests: No results for input(s): "TSH", "T4TOTAL", "FREET4", "T3FREE", "THYROIDAB" in the last 72 hours. Anemia Panel: No results for input(s): "VITAMINB12", "FOLATE", "FERRITIN", "TIBC", "IRON", "RETICCTPCT" in the last 72 hours. Urine analysis:    Component Value Date/Time   COLORURINE AMBER (A) 10/05/2023 0230   APPEARANCEUR CLEAR 10/05/2023 0230   LABSPEC 1.008 10/05/2023 0230   PHURINE 7.0 10/05/2023 0230   GLUCOSEU >=500 (A) 10/05/2023 0230   HGBUR NEGATIVE 10/05/2023 0230   BILIRUBINUR NEGATIVE 10/05/2023 0230   KETONESUR NEGATIVE 10/05/2023 0230   PROTEINUR NEGATIVE 10/05/2023 0230   NITRITE NEGATIVE 10/05/2023 0230   LEUKOCYTESUR NEGATIVE 10/05/2023 0230   Sepsis Labs: @LABRCNTIP (procalcitonin:4,lacticidven:4)  )No results found for this or any previous visit (from the past 240 hour(s)).   Radiology Studies: ECHOCARDIOGRAM LIMITED  Result Date: 10/16/2023    ECHOCARDIOGRAM LIMITED REPORT   Patient Name:   Bradley Powell [facility] Date of Exam: 10/16/2023 Medical Rec #:  [id number]          Height:       68.0 in Accession #:     [id number]         Weight:       249.2 lb Date of Birth:  27-Dec-1980         BSA:          2.244 m Patient Age:    42 years           BP:           101/71 mmHg Patient Gender: M                  HR:           75 bpm. Exam Location:  Inpatient Procedure: Limited Echo  and Limited Color Doppler Indications:    CHF- Acute Systolic I50.21  History:        Patient has prior history of Echocardiogram examinations, most                 recent 10/03/2023. Cardiomyopathy and CHF, Defibrillator,                 Arrythmias:Atrial Fibrillation, Atrial Flutter, PVC and                 Tachycardia, Signs/Symptoms:Hypotension and Syncope; Risk                 Factors:Sleep Apnea.  Sonographer:    Lucendia Herrlich RCS Referring Phys: [phone] ALMA L DIAZ IMPRESSIONS  1. Limited study for LV function  2. Left ventricular ejection fraction, by estimation, is 35 to 40%. Left ventricular ejection fraction by 2D MOD biplane is 37.9 %. The left ventricle has moderately decreased function. The left ventricle demonstrates global hypokinesis. There is mild left ventricular hypertrophy.  3. The mitral valve is abnormal. Mild mitral valve regurgitation.  4. Tricuspid valve regurgitation is moderate.  5. Right ventricular systolic function is mildly reduced. The right ventricular size is normal. There is normal pulmonary artery systolic pressure. The estimated right ventricular systolic pressure is 35.2 mmHg.  6. The inferior vena cava is dilated in size with <50% respiratory variability, suggesting right atrial pressure of 15 mmHg.  7. Rhythm strip during this exam demonstrates atrial flutter. Comparison(s): No significant change from prior study. 10/03/2023: LVEF 35-40%. FINDINGS  Left Ventricle: Left ventricular ejection fraction, by estimation, is 35 to 40%. Left ventricular ejection fraction by 2D MOD biplane is 37.9 %. The left ventricle has moderately decreased function. The left ventricle demonstrates global hypokinesis. The left  ventricular internal cavity size was normal in size. There is mild left ventricular hypertrophy. Right Ventricle: The right ventricular size is normal. No increase in right ventricular wall thickness. Right ventricular systolic function is mildly reduced. There is normal pulmonary artery systolic pressure. The tricuspid regurgitant velocity is 2.25 m/s, and with an assumed right atrial pressure of 15 mmHg, the estimated right ventricular systolic pressure is 35.2 mmHg. Pericardium: Trivial pericardial effusion is present. The pericardial effusion is circumferential. Mitral Valve: The mitral valve is abnormal. There is moderate thickening of the anterior and posterior mitral valve leaflet(s). Mild mitral valve regurgitation, with posteriorly-directed jet. Tricuspid Valve: The tricuspid valve is grossly normal. Tricuspid valve regurgitation is moderate. Pulmonic Valve: The pulmonic valve was grossly normal. Pulmonic valve regurgitation is trivial. Aorta: The aortic root and ascending aorta are structurally normal, with no evidence of dilitation. Venous: The inferior vena cava is dilated in size with less than 50% respiratory variability, suggesting right atrial pressure of 15 mmHg. EKG: Rhythm strip during this exam demonstrates atrial flutter. Additional Comments: A device lead is visualized.  LEFT VENTRICLE PLAX 2D                        Biplane EF (MOD) LVIDd:         5.10 cm         LV Biplane EF:   Left LVIDs:         4.20 cm                          ventricular LV PW:         1.20 cm  ejection LV IVS:        0.90 cm                          fraction by LVOT diam:     2.00 cm                          2D MOD LVOT Area:     3.14 cm                         biplane is                                                 37.9 %.  LV Volumes (MOD)               Diastology LV vol d, MOD    110.0 ml      LV e' medial:    8.81 cm/s A2C:                           LV E/e' medial:  10.7 LV vol d, MOD     110.0 ml      LV e' lateral:   8.92 cm/s A4C:                           LV E/e' lateral: 10.6 LV vol s, MOD    62.4 ml A2C: LV vol s, MOD    68.7 ml A4C: LV SV MOD A2C:   47.6 ml LV SV MOD A4C:   110.0 ml LV SV MOD BP:    41.7 ml RIGHT VENTRICLE            IVC RV S prime:     8.86 cm/s  IVC diam: 2.80 cm TAPSE (M-mode): 1.3 cm LEFT ATRIUM         Index LA diam:    5.10 cm 2.27 cm/m   AORTA Ao Root diam: 2.60 cm Ao Asc diam:  2.20 cm MITRAL VALVE               TRICUSPID VALVE MV Area (PHT): 6.71 cm    TR Peak grad:   20.2 mmHg MV Decel Time: 113 msec    TR Vmax:        225.00 cm/s MR Peak grad: 63.7 mmHg MR Vmax:      399.00 cm/s  SHUNTS MV E velocity: 94.70 cm/s  Systemic Diam: 2.00 cm MV A velocity: 53.40 cm/s MV E/A ratio:  1.77 Zoila Shutter MD Electronically signed by Zoila Shutter MD Signature Date/Time: 10/16/2023/5:16:29 PM    Final      Scheduled Meds:  acetaZOLAMIDE  250 mg Oral BID   allopurinol  200 mg Oral Daily   amiodarone  200 mg Oral Daily   apixaban  5 mg Oral BID   Chlorhexidine Gluconate Cloth  6 each Topical Daily   colchicine  0.3 mg Oral Daily   diclofenac Sodium  2 g Topical QID   empagliflozin  10 mg Oral Daily   levothyroxine  50 mcg Oral Q0600   mexiletine  150 mg Oral Q12H   pantoprazole  40 mg Oral BID   potassium  chloride  60 mEq Oral 6 X Daily   sodium chloride flush  10-40 mL Intracatheter Q12H   spironolactone  25 mg Oral Daily   Continuous Infusions:  furosemide (LASIX) 200 mg in dextrose 5 % 100 mL (2 mg/mL) infusion 30 mg/hr (10/18/23 1011)     LOS: 18 days    Time spent:    Zannie Cove, MD [facility]   10/18/2023, 12:08 PM

## 2023-10-18 NOTE — Plan of Care (Signed)
  Problem: Clinical Measurements: Goal: Ability to maintain clinical measurements within normal limits will improve Outcome: Progressing Goal: Will remain free from infection Outcome: Progressing Goal: Cardiovascular complication will be avoided Outcome: Progressing   

## 2023-10-18 NOTE — Progress Notes (Addendum)
Advanced Heart Failure Rounding Note   Subjective:   Diuresed with lasix drip, metolazone, and diamox. Overall weight down 68 pounds.   CO-OX 79%    Denies SOB. Had 3-4 bms yesterday.    Objective:    Echo 11/19: EF 35-40%, LV with GHK, RV mod reduced, mildly elevated PASP, RVSP ~39, trivial MR   Weight Range:  Vital Signs:   Temp:  [96.8 F (36 C)-98.2 F (36.8 C)] 98.2 F (36.8 C) (12/04 0114) Pulse Rate:  [65-73] 72 (12/04 0114) Resp:  [18] 18 (12/04 0114) BP: (101-124)/(62-83) 107/73 (12/04 0114) SpO2:  [96 %-100 %] 98 % (12/04 0114) Weight:  [106.6 kg] 106.6 kg (12/04 0500) Last BM Date : 10/17/23  Weight change: Filed Weights   10/17/23 0300 10/17/23 0428 10/18/23 0500  Weight: 110.5 kg 110.5 kg 106.6 kg    Intake/Output:   Intake/Output Summary (Last 24 hours) at 10/18/2023 0716 Last data filed at 10/18/2023 0118 Gross per 24 hour  Intake 585 ml  Output 3400 ml  Net -2815 ml    CVP 13 personally checked.  Physical Exam: General:  Well appearing. No resp difficulty HEENT: normal Neck: supple. JVP 11-12 . Carotids 2+ bilat; no bruits. No lymphadenopathy or thryomegaly appreciated. Cor: PMI nondisplaced. Regular rate & rhythm. No rubs, gallops or murmurs. Lungs: clear Abdomen: soft, nontender, nondistended. No hepatosplenomegaly. No bruits or masses. Good bowel sounds. Extremities: no cyanosis, clubbing, rash, R and LLE unna booths.  Neuro: alert & orientedx3, cranial nerves grossly intact. moves all 4 extremities w/o difficulty. Affect pleasant  Telemetry: A fib   Labs: Basic Metabolic Panel: Recent Labs  Lab 10/12/23 0318 10/13/23 0300 10/13/23 1600 10/15/23 0431 10/16/23 0425 10/16/23 1400 10/17/23 0502 10/17/23 1417 10/18/23 0412  NA 129* 133*   < > 131* 130* 131* 135 135 134*  K 3.2* 3.5   < > 3.3* 3.8 4.5 2.6* 3.3* 3.0*  CL 89* 91*   < > 94* 98 99 100 95* 97*  CO2 27 26   < > 27 25 20* 24 27 27   GLUCOSE 117* 122*   < > 124* 166*  101* 131* 116* 118*  BUN 47* 52*   < > 51* 44* 49* 51* 52* 51*  CREATININE 2.59* 2.61*   < > 2.40* 2.44* 2.69* 2.66* 2.58* 2.45*  CALCIUM 9.0 8.9   < > 9.1 8.4* 9.3 8.6* 9.5 9.1  MG 2.5* 2.5*  --  2.8*  --   --  2.6*  --  2.7*  PHOS 3.8 4.2  --   --   --   --   --   --   --    < > = values in this interval not displayed.   Liver Function Tests: Recent Labs  Lab 10/12/23 0318 10/13/23 0300  AST 33 32  ALT 38 36  ALKPHOS 267* 276*  BILITOT 2.5* 2.2*  PROT 7.2 7.6  ALBUMIN 2.3* 2.4*    No results for input(s): "LIPASE", "AMYLASE" in the last 168 hours. No results for input(s): "AMMONIA" in the last 168 hours.  CBC: Recent Labs  Lab 10/12/23 0318 10/13/23 0300 10/14/23 0500 10/15/23 0431 10/16/23 0425  WBC 9.1 8.9 11.2* 9.3 9.4  NEUTROABS 6.6 6.5  --   --   --   HGB 13.1 13.3 13.0 12.5* 11.9*  HCT 40.8 40.9 40.8 40.1 38.4*  MCV 81.4 82.3 81.9 84.2 83.8  PLT 329 375 365 387 344   Cardiac Enzymes: No  results for input(s): "CKTOTAL", "CKMB", "CKMBINDEX", "TROPONINI" in the last 168 hours.  BNP: BNP (last 3 results) Recent Labs    05/23/23 1618 09/30/23 0513 10/13/23 0300  BNP 209.9* 222.1* 285.0*   ProBNP (last 3 results) No results for input(s): "PROBNP" in the last 8760 hours.  Imaging: ECHOCARDIOGRAM LIMITED  Result Date: 10/16/2023    ECHOCARDIOGRAM LIMITED REPORT   Patient Name:   Bradley Powell [facility] Date of Exam: 10/16/2023 Medical Rec #:  [id number]          Height:       68.0 in Accession #:    [id number]         Weight:       249.2 lb Date of Birth:  [birth year]         BSA:          2.244 m Patient Age:    42 years           BP:           101/71 mmHg Patient Gender: M                  HR:           75 bpm. Exam Location:  Inpatient Procedure: Limited Echo and Limited Color Doppler Indications:    CHF- Acute Systolic I50.21  History:        Patient has prior history of Echocardiogram examinations, most                 recent 10/03/2023. Cardiomyopathy and CHF,  Defibrillator,                 Arrythmias:Atrial Fibrillation, Atrial Flutter, PVC and                 Tachycardia, Signs/Symptoms:Hypotension and Syncope; Risk                 Factors:Sleep Apnea.  Sonographer:    Lucendia Herrlich RCS Referring Phys: [phone] ALMA L DIAZ IMPRESSIONS  1. Limited study for LV function  2. Left ventricular ejection fraction, by estimation, is 35 to 40%. Left ventricular ejection fraction by 2D MOD biplane is 37.9 %. The left ventricle has moderately decreased function. The left ventricle demonstrates global hypokinesis. There is mild left ventricular hypertrophy.  3. The mitral valve is abnormal. Mild mitral valve regurgitation.  4. Tricuspid valve regurgitation is moderate.  5. Right ventricular systolic function is mildly reduced. The right ventricular size is normal. There is normal pulmonary artery systolic pressure. The estimated right ventricular systolic pressure is 35.2 mmHg.  6. The inferior vena cava is dilated in size with <50% respiratory variability, suggesting right atrial pressure of 15 mmHg.  7. Rhythm strip during this exam demonstrates atrial flutter. Comparison(s): No significant change from prior study. 10/03/2023: LVEF 35-40%. FINDINGS  Left Ventricle: Left ventricular ejection fraction, by estimation, is 35 to 40%. Left ventricular ejection fraction by 2D MOD biplane is 37.9 %. The left ventricle has moderately decreased function. The left ventricle demonstrates global hypokinesis. The left ventricular internal cavity size was normal in size. There is mild left ventricular hypertrophy. Right Ventricle: The right ventricular size is normal. No increase in right ventricular wall thickness. Right ventricular systolic function is mildly reduced. There is normal pulmonary artery systolic pressure. The tricuspid regurgitant velocity is 2.25 m/s, and with an assumed right atrial pressure of 15 mmHg, the estimated right ventricular systolic pressure is 35.2 mmHg.  Pericardium: Trivial pericardial effusion is  present. The pericardial effusion is circumferential. Mitral Valve: The mitral valve is abnormal. There is moderate thickening of the anterior and posterior mitral valve leaflet(s). Mild mitral valve regurgitation, with posteriorly-directed jet. Tricuspid Valve: The tricuspid valve is grossly normal. Tricuspid valve regurgitation is moderate. Pulmonic Valve: The pulmonic valve was grossly normal. Pulmonic valve regurgitation is trivial. Aorta: The aortic root and ascending aorta are structurally normal, with no evidence of dilitation. Venous: The inferior vena cava is dilated in size with less than 50% respiratory variability, suggesting right atrial pressure of 15 mmHg. EKG: Rhythm strip during this exam demonstrates atrial flutter. Additional Comments: A device lead is visualized.  LEFT VENTRICLE PLAX 2D                        Biplane EF (MOD) LVIDd:         5.10 cm         LV Biplane EF:   Left LVIDs:         4.20 cm                          ventricular LV PW:         1.20 cm                          ejection LV IVS:        0.90 cm                          fraction by LVOT diam:     2.00 cm                          2D MOD LVOT Area:     3.14 cm                         biplane is                                                 37.9 %.  LV Volumes (MOD)               Diastology LV vol d, MOD    110.0 ml      LV e' medial:    8.81 cm/s A2C:                           LV E/e' medial:  10.7 LV vol d, MOD    110.0 ml      LV e' lateral:   8.92 cm/s A4C:                           LV E/e' lateral: 10.6 LV vol s, MOD    62.4 ml A2C: LV vol s, MOD    68.7 ml A4C: LV SV MOD A2C:   47.6 ml LV SV MOD A4C:   110.0 ml LV SV MOD BP:    41.7 ml RIGHT VENTRICLE            IVC RV S prime:     8.86 cm/s  IVC diam: 2.80 cm TAPSE (M-mode): 1.3  cm LEFT ATRIUM         Index LA diam:    5.10 cm 2.27 cm/m   AORTA Ao Root diam: 2.60 cm Ao Asc diam:  2.20 cm MITRAL VALVE               TRICUSPID  VALVE MV Area (PHT): 6.71 cm    TR Peak grad:   20.2 mmHg MV Decel Time: 113 msec    TR Vmax:        225.00 cm/s MR Peak grad: 63.7 mmHg MR Vmax:      399.00 cm/s  SHUNTS MV E velocity: 94.70 cm/s  Systemic Diam: 2.00 cm MV A velocity: 53.40 cm/s MV E/A ratio:  1.77 Zoila Shutter MD Electronically signed by Zoila Shutter MD Signature Date/Time: 10/16/2023/5:16:29 PM    Final     Medications:    Scheduled Medications:  acetaZOLAMIDE  250 mg Oral BID   allopurinol  200 mg Oral Daily   amiodarone  200 mg Oral Daily   apixaban  5 mg Oral BID   Chlorhexidine Gluconate Cloth  6 each Topical Daily   colchicine  0.3 mg Oral Daily   diclofenac Sodium  2 g Topical QID   empagliflozin  10 mg Oral Daily   levothyroxine  50 mcg Oral Q0600   mexiletine  150 mg Oral Q12H   pantoprazole  40 mg Oral BID   polyethylene glycol  17 g Oral Daily   potassium chloride  60 mEq Oral 6 X Daily   senna-docusate  1 tablet Oral BID   sodium chloride flush  10-40 mL Intracatheter Q12H   spironolactone  25 mg Oral Daily    Infusions:  furosemide (LASIX) 200 mg in dextrose 5 % 100 mL (2 mg/mL) infusion 30 mg/hr (10/18/23 0402)    PRN Medications: acetaminophen, baclofen, ondansetron (ZOFRAN) IV, oxyCODONE-acetaminophen, phenol, sodium chloride, sodium chloride flush  Assessment/Plan:  1. 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 Ratio 1.26 read as equivocal.  SPEP negative - cMRI repeated (11/22): LVEF 43%, RVEF 44%, LGE concerning for sarcoid.  - Echo (4/23): EF 35-40% Moderate RV dysfunction - Has seen geneticist, Dr. Jomarie Longs. Work up c/w LMNA.  - Admitted with NYHA IIIb symptoms and 40 pound weight gain - Echo 11/19 EF 35-40%, LV with GHK, RV mod reduced, mildly elevated PASP, RVSP ~39, trivial MR  - Limited echo yesterday EF 35-40%, LV with GHK, mild LVH, mild MR, mod TR, RV mildly reduced. Trivial circumferential  pericardial effusion present.  - Remains volume overloaded with R>>L HF. Slow progress. CVP 13 today. Needs one more day.  - Continue lasix 30/hr. Give diamox 250 mg twice a day.   - Continue Spiro to help with hypokalemia - Continue Jardiance 10 - Has been off b-blocker due to bradycardia and heart block - Off ARNi and ARB (hypotension and N/V in the past) - can rechallenge as able   2. Syncope/VT - 04/23 > secondary to VT - s/p ICD 4/23. - Continue mexiletine 150 mg bid   - Continue amiodarone 200 mg daiy - Followed by Dr. Ladona Ridgel  3. AKI on CKD Stage IIIb - Due to cardiorenal syndrome. Baseline SCr 1.7-2.0. Peaked 3.03 - Overall improving with diuresis. Stable  2.45 today   4. Chronic AF/AFL - S/p AFL ablation 5/19 with Dr. Ladona Ridgel. - Continue Eliquis 5 mg bid and amiodarone 200 daily  -  No role for DC-CV as he has been in AFL for a long time   5.  OSA: Continue CPAP   6. PVCs/VT - Continue mexiletine + amiodarone. - Followed by EP   7. Obesity - Body mass index is Body mass index is 35.73 kg/m. - Consider GLP1RA  8. Hypokalemia - K 3. Supp K . - Continue Spiro  9. ID - per primary. WBC peaked at 21.6 with fever. No infective source identified.  - UA (-), PCXR (-), RVP (-), Bcx (-) - Completed IV antibiotics.   10. H/o Gout - R>L knee/leg swelling  - Uric acid 7.0  Frequent BMS. Change miralax to as needed. Stop senna.  Consult cardiac rehab.   Length of Stay: 18  Amy Clegg NP-C  10/18/2023, 7:16 AM  Advanced Heart Failure Team Pager [phone] (M-F; 7a - 4p)  Please contact [facility] for night-coverage after hours (4p -7a ) and weekends on [email or web address]   Patient seen with NP, agree with the above note.   Good diuresis yesterday, weight down 8 lbs.  Co-ox 79%.  Creatinine lower at 2.45, K still low. CVP 13 today.   General: NAD Neck: JVP 12-14 cm, no thyromegaly or thyroid nodule.  Lungs: Clear to auscultation bilaterally with normal respiratory  effort. CV: Nondisplaced PMI.  Heart irregular S1/S2, no S3/S4, no murmur.  1+ ankle edema.  Abdomen: Soft, nontender, no hepatosplenomegaly, no distention.  Skin: Intact without lesions or rashes.  Neurologic: Alert and oriented x 3.  Psych: Normal affect. Extremities: No clubbing or cyanosis.  HEENT: Normal.   Patient with LMNA cardiomyopathy, echo this admission with EF 35-40% and moderate RV dysfunction.  He is still volume overloaded on exam (prominent RV failure) with CVP better at around 13.  Good diuresis yesterday, weight down 8 lbs.  Good co-ox.  Creatinine mildly lower at 2.58 => 2.45. - Continue Lasix gtt 30 mg/hr.  Maybe ready for po tomorrow? - Replace K aggressively.  - Acetazolamide 250 mg bid.  - Continue spironolactone 25 mg daily  - Continue Jardiance 10 mg daily.    Atrial fibrillation/flutter is permanent, continue Eliquis.   Marca Ancona 10/18/2023 9:47 AM

## 2023-10-18 NOTE — Plan of Care (Signed)
Pt vital signs, CVP and lab results trending towards normal limits

## 2023-10-19 DIAGNOSIS — I5043 Acute on chronic combined systolic (congestive) and diastolic (congestive) heart failure: Secondary | ICD-10-CM | POA: Diagnosis not present

## 2023-10-19 LAB — COOXEMETRY PANEL
Carboxyhemoglobin: 2 % — ABNORMAL HIGH (ref 0.5–1.5)
Methemoglobin: <0.7 % (ref 0.0–1.5)
O2 Saturation: 81 %
Total hemoglobin: 15.1 g/dL (ref 12.0–16.0)

## 2023-10-19 LAB — BASIC METABOLIC PANEL
Anion gap: 12 (ref 5–15)
Anion gap: 16 — ABNORMAL HIGH (ref 5–15)
Anion gap: 15 (ref 5–15)
BUN: 47 mg/dL — ABNORMAL HIGH (ref 6–20)
BUN: 47 mg/dL — ABNORMAL HIGH (ref 6–20)
BUN: 46 mg/dL — ABNORMAL HIGH (ref 6–20)
CO2: 24 mmol/L (ref 22–32)
CO2: 27 mmol/L (ref 22–32)
CO2: 27 mmol/L (ref 22–32)
Calcium: 9.3 mg/dL (ref 8.9–10.3)
Calcium: 9.4 mg/dL (ref 8.9–10.3)
Calcium: 9.6 mg/dL (ref 8.9–10.3)
Chloride: 92 mmol/L — ABNORMAL LOW (ref 98–111)
Chloride: 88 mmol/L — ABNORMAL LOW (ref 98–111)
Chloride: 90 mmol/L — ABNORMAL LOW (ref 98–111)
Creatinine, Ser: 2.57 mg/dL — ABNORMAL HIGH (ref 0.61–1.24)
Creatinine, Ser: 2.71 mg/dL — ABNORMAL HIGH (ref 0.61–1.24)
Creatinine, Ser: 2.58 mg/dL — ABNORMAL HIGH (ref 0.61–1.24)
GFR, Estimated: 31 mL/min — ABNORMAL LOW (ref >60)
GFR, Estimated: 29 mL/min — ABNORMAL LOW (ref >60)
GFR, Estimated: 31 mL/min — ABNORMAL LOW (ref >60)
Glucose, Bld: 156 mg/dL — ABNORMAL HIGH (ref 70–99)
Glucose, Bld: 104 mg/dL — ABNORMAL HIGH (ref 70–99)
Glucose, Bld: 151 mg/dL — ABNORMAL HIGH (ref 70–99)
Potassium: >7.5 mmol/L (ref 3.5–5.1)
Potassium: 3.5 mmol/L (ref 3.5–5.1)
Potassium: 3.5 mmol/L (ref 3.5–5.1)
Sodium: 128 mmol/L — ABNORMAL LOW (ref 135–145)
Sodium: 131 mmol/L — ABNORMAL LOW (ref 135–145)
Sodium: 132 mmol/L — ABNORMAL LOW (ref 135–145)

## 2023-10-19 MED ORDER — POLYETHYLENE GLYCOL 3350 17 G PO PACK
17.0000 g | PACK | Freq: Every day | ORAL | Status: DC
  Filled 2023-10-19 (×2): qty 1

## 2023-10-19 MED ORDER — METOLAZONE 5 MG PO TABS
5.0000 mg | ORAL_TABLET | Freq: Once | ORAL | Status: AC
  Administered 2023-10-19: 5 mg via ORAL
  Filled 2023-10-19: qty 1

## 2023-10-19 NOTE — Progress Notes (Signed)
Orthopedic Tech Progress Note Patient Details:  Bradley Powell [birth year] [id number]  Ortho Devices Type of Ortho Device: Radio broadcast assistant Ortho Device/Splint Location: BLE Ortho Device/Splint Interventions: Ordered, Application   Post Interventions Patient Tolerated: Well  Diannia Ruder 10/19/2023, 7:19 PM

## 2023-10-19 NOTE — Progress Notes (Signed)
PROGRESS NOTE    Bradley Powell  PXT:[id number] DOB: Dec 16, 1980 DOA: 09/29/2023 PCP: [name], MD  42/M chronic combined CHF, NICM, OSA, obesity, paroxysmal A-fib, to the ED with worsening edema, shortness of breath despite compliance with medications.  In the ED he was massively volume overloaded  -Chest x-ray noted cardiomegaly pulmonary vascular congestion  -CHF team following, on Lasix drip, improving  Subjective: Feels okay overall, swelling much improved  Assessment and Plan:  Acute on chronic combined CHF, predominantly RV failure -Echo noted EF of 35-40%, moderately reduced RV  -Diuresed aggressively with IV Lasix, weight down over 65 LB  -Heart failure team following, on Lasix gtt., Diamox, Aldactone, Jardiance  -Plan to switch to oral diuretic tomorrow  Atrial fibrillation, chronic (HCC), history of VT Syncope with ventricular tachycardia.  Sp ICD 02/2022  -On amiodarone and mexiletine -per Cards Afib is permanent, no plan for DCCV  Acute kidney injury superimposed on chronic kidney disease (HCC) CKD stage 3b, Hyponatremia, hypokalemia.  Baseline creatinine 1.7-2 range -Peaked at 3.0, now improving  Hepatic cirrhosis (HCC) Continue diuretics as above No signs of encephalopathy.   Gout Will continue with allopurinol and colchicine, he has high uric acid, and with high dose of loop diuretic he is in risk for acute flare.  -Also with osteoarthritis of the knees, on topical diclofenac  Obesity, class 2 Calculated BMI is 38.3   Leukocytosis Reactive leukocytosis,.  Patient completed 5 days of antibiotic therapy with cefepime.  No clear source of infection identified  Hypothyroidism Continue with levothyroxine.   Iron deficiency Serum iron of 28, TIBC 424, transferrin saturation 7 and ferritin 129. Plan to add IV iron during this hospitalization.  Hb stable  DVT prophylaxis: Apixaban Code Status: Full code Family Communication: None  present Disposition Plan: Home likely 1-2days  Consultants:    Procedures:   Antimicrobials:    Objective: Vitals:   10/19/23 0500 10/19/23 0504 10/19/23 0814 10/19/23 1157  BP:  97/65 97/74 101/64  Pulse:  69 74 66  Resp:  18 16 16   Temp:  97.6 F (36.4 C) (!) 97.5 F (36.4 C) (!) 97.2 F (36.2 C)  TempSrc:  Oral Oral Oral  SpO2:  100% 100% 100%  Weight: 106.6 kg 106.6 kg    Height:        Intake/Output Summary (Last 24 hours) at 10/19/2023 1255 Last data filed at 10/19/2023 0600 Gross per 24 hour  Intake 1083 ml  Output 5100 ml  Net -4017 ml   Filed Weights   10/18/23 0500 10/19/23 0500 10/19/23 0504  Weight: 106.6 kg 106.6 kg 106.6 kg    Examination:  General exam: Obese chronically ill male sitting up in bed, AAOx3 HEENT: Positive JVD CVS: S1-S2, irregular rhythm Lungs: Clear bilaterally Abdomen: Soft, nontender, bowel sounds present Extremities: 1+ edema  Psychiatry:  Mood & affect appropriate.     Data Reviewed:   CBC: Recent Labs  Lab 10/13/23 0300 10/14/23 0500 10/15/23 0431 10/16/23 0425  WBC 8.9 11.2* 9.3 9.4  NEUTROABS 6.5  --   --   --   HGB 13.3 13.0 12.5* 11.9*  HCT 40.9 40.8 40.1 38.4*  MCV 82.3 81.9 84.2 83.8  PLT 375 365 387 344   Basic Metabolic Panel: Recent Labs  Lab 10/13/23 0300 10/13/23 1600 10/15/23 0431 10/16/23 0425 10/17/23 0502 10/17/23 1417 10/18/23 0412 10/19/23 0500 10/19/23 0808  NA 133*   < > 131*   < > 135 135 134* 128* 131*  K 3.5   < >  3.3*   < > 2.6* 3.3* 3.0* >7.5* 3.5  CL 91*   < > 94*   < > 100 95* 97* 92* 88*  CO2 26   < > 27   < > 24 27 27 24 27   GLUCOSE 122*   < > 124*   < > 131* 116* 118* 156* 104*  BUN 52*   < > 51*   < > 51* 52* 51* 47* 47*  CREATININE 2.61*   < > 2.40*   < > 2.66* 2.58* 2.45* 2.57* 2.71*  CALCIUM 8.9   < > 9.1   < > 8.6* 9.5 9.1 9.3 9.4  MG 2.5*  --  2.8*  --  2.6*  --  2.7*  --   --   PHOS 4.2  --   --   --   --   --   --   --   --    < > = values in this interval  not displayed.   GFR: Estimated Creatinine Clearance: 42.9 mL/min (A) (by C-G formula based on SCr of 2.71 mg/dL (H)). Liver Function Tests: Recent Labs  Lab 10/13/23 0300  AST 32  ALT 36  ALKPHOS 276*  BILITOT 2.2*  PROT 7.6  ALBUMIN 2.4*   No results for input(s): "LIPASE", "AMYLASE" in the last 168 hours. No results for input(s): "AMMONIA" in the last 168 hours. Coagulation Profile: No results for input(s): "INR", "PROTIME" in the last 168 hours. Cardiac Enzymes: No results for input(s): "CKTOTAL", "CKMB", "CKMBINDEX", "TROPONINI" in the last 168 hours. BNP (last 3 results) No results for input(s): "PROBNP" in the last 8760 hours. HbA1C: No results for input(s): "HGBA1C" in the last 72 hours. CBG: No results for input(s): "GLUCAP" in the last 168 hours. Lipid Profile: No results for input(s): "CHOL", "HDL", "LDLCALC", "TRIG", "CHOLHDL", "LDLDIRECT" in the last 72 hours. Thyroid Function Tests: No results for input(s): "TSH", "T4TOTAL", "FREET4", "T3FREE", "THYROIDAB" in the last 72 hours. Anemia Panel: No results for input(s): "VITAMINB12", "FOLATE", "FERRITIN", "TIBC", "IRON", "RETICCTPCT" in the last 72 hours. Urine analysis:    Component Value Date/Time   COLORURINE AMBER (A) 10/05/2023 0230   APPEARANCEUR CLEAR 10/05/2023 0230   LABSPEC 1.008 10/05/2023 0230   PHURINE 7.0 10/05/2023 0230   GLUCOSEU >=500 (A) 10/05/2023 0230   HGBUR NEGATIVE 10/05/2023 0230   BILIRUBINUR NEGATIVE 10/05/2023 0230   KETONESUR NEGATIVE 10/05/2023 0230   PROTEINUR NEGATIVE 10/05/2023 0230   NITRITE NEGATIVE 10/05/2023 0230   LEUKOCYTESUR NEGATIVE 10/05/2023 0230   Sepsis Labs: @LABRCNTIP (procalcitonin:4,lacticidven:4)  )No results found for this or any previous visit (from the past 240 hour(s)).   Radiology Studies: No results found.   Scheduled Meds:  allopurinol  200 mg Oral Daily   amiodarone  200 mg Oral Daily   apixaban  5 mg Oral BID   Chlorhexidine Gluconate Cloth   6 each Topical Daily   colchicine  0.3 mg Oral Daily   diclofenac Sodium  2 g Topical QID   empagliflozin  10 mg Oral Daily   levothyroxine  50 mcg Oral Q0600   mexiletine  150 mg Oral Q12H   pantoprazole  40 mg Oral BID   polyethylene glycol  17 g Oral Daily   potassium chloride  60 mEq Oral 6 X Daily   sodium chloride flush  10-40 mL Intracatheter Q12H   spironolactone  25 mg Oral Daily   Continuous Infusions:  furosemide (LASIX) 200 mg in dextrose 5 % 100 mL (2  mg/mL) infusion 30 mg/hr (10/19/23 0752)     LOS: 19 days    Time spent:    Zannie Cove, MD [facility]   10/19/2023, 12:55 PM

## 2023-10-19 NOTE — Progress Notes (Addendum)
Advanced Heart Failure Rounding Note   Subjective:   Diuresed with lasix drip, metolazone, and diamox.   Overall weight down 68 pounds (weight unchanged last 3 days). UOP decreasing. -3L UOP  CO-OX 81%. CVP 12  K >7.5?  Feels good. Noticing improvement in his legs.    Objective:    Echo 11/19: EF 35-40%, LV with GHK, RV mod reduced, mildly elevated PASP, RVSP ~39, trivial MR   Weight Range:  Vital Signs:   Temp:  [97.3 F (36.3 C)-98.3 F (36.8 C)] 97.6 F (36.4 C) (12/05 0504) Pulse Rate:  [69-74] 69 (12/05 0504) Resp:  [16-18] 18 (12/05 0504) BP: (95-109)/(63-76) 97/65 (12/05 0504) SpO2:  [93 %-100 %] 100 % (12/05 0504) Weight:  [106.6 kg] 106.6 kg (12/05 0504) Last BM Date : 10/18/23  Weight change: Filed Weights   10/18/23 0500 10/19/23 0500 10/19/23 0504  Weight: 106.6 kg 106.6 kg 106.6 kg    Intake/Output:   Intake/Output Summary (Last 24 hours) at 10/19/2023 0804 Last data filed at 10/19/2023 0600 Gross per 24 hour  Intake 1443 ml  Output 6000 ml  Net -4557 ml    CVP 12 Physical Exam: General:  well appearing.  No respiratory difficulty HEENT: normal Neck: supple. JVD ~12 cm. Carotids 2+ bilat; no bruits. No lymphadenopathy or thyromegaly appreciated. Cor: PMI nondisplaced. Regular rate & irregular rhythm. No rubs, gallops or murmurs. Lungs: clear Abdomen: soft, nontender, nondistended. No hepatosplenomegaly. No bruits or masses. Good bowel sounds. Extremities: no cyanosis, clubbing, rash, trace BLE edema. + UNNA boots. PICC RUE Neuro: alert & oriented x 3, cranial nerves grossly intact. moves all 4 extremities w/o difficulty. Affect pleasant.   Telemetry: A fib/flutter 70s (Personally reviewed)    Labs: Basic Metabolic Panel: Recent Labs  Lab 10/13/23 0300 10/13/23 1600 10/15/23 0431 10/16/23 0425 10/16/23 1400 10/17/23 0502 10/17/23 1417 10/18/23 0412 10/19/23 0500  NA 133*   < > 131*   < > 131* 135 135 134* 128*  K 3.5   < >  3.3*   < > 4.5 2.6* 3.3* 3.0* >7.5*  CL 91*   < > 94*   < > 99 100 95* 97* 92*  CO2 26   < > 27   < > 20* 24 27 27 24   GLUCOSE 122*   < > 124*   < > 101* 131* 116* 118* 156*  BUN 52*   < > 51*   < > 49* 51* 52* 51* 47*  CREATININE 2.61*   < > 2.40*   < > 2.69* 2.66* 2.58* 2.45* 2.57*  CALCIUM 8.9   < > 9.1   < > 9.3 8.6* 9.5 9.1 9.3  MG 2.5*  --  2.8*  --   --  2.6*  --  2.7*  --   PHOS 4.2  --   --   --   --   --   --   --   --    < > = values in this interval not displayed.   Liver Function Tests: Recent Labs  Lab 10/13/23 0300  AST 32  ALT 36  ALKPHOS 276*  BILITOT 2.2*  PROT 7.6  ALBUMIN 2.4*    No results for input(s): "LIPASE", "AMYLASE" in the last 168 hours. No results for input(s): "AMMONIA" in the last 168 hours.  CBC: Recent Labs  Lab 10/13/23 0300 10/14/23 0500 10/15/23 0431 10/16/23 0425  WBC 8.9 11.2* 9.3 9.4  NEUTROABS 6.5  --   --   --  HGB 13.3 13.0 12.5* 11.9*  HCT 40.9 40.8 40.1 38.4*  MCV 82.3 81.9 84.2 83.8  PLT 375 365 387 344   Cardiac Enzymes: No results for input(s): "CKTOTAL", "CKMB", "CKMBINDEX", "TROPONINI" in the last 168 hours.  BNP: BNP (last 3 results) Recent Labs    05/23/23 1618 09/30/23 0513 10/13/23 0300  BNP 209.9* 222.1* 285.0*   ProBNP (last 3 results) No results for input(s): "PROBNP" in the last 8760 hours.  Imaging: No results found.  Medications:    Scheduled Medications:  acetaZOLAMIDE  250 mg Oral BID   allopurinol  200 mg Oral Daily   amiodarone  200 mg Oral Daily   apixaban  5 mg Oral BID   Chlorhexidine Gluconate Cloth  6 each Topical Daily   colchicine  0.3 mg Oral Daily   diclofenac Sodium  2 g Topical QID   empagliflozin  10 mg Oral Daily   levothyroxine  50 mcg Oral Q0600   mexiletine  150 mg Oral Q12H   pantoprazole  40 mg Oral BID   potassium chloride  60 mEq Oral 6 X Daily   sodium chloride flush  10-40 mL Intracatheter Q12H   spironolactone  25 mg Oral Daily    Infusions:  furosemide  (LASIX) 200 mg in dextrose 5 % 100 mL (2 mg/mL) infusion 30 mg/hr (10/19/23 0752)    PRN Medications: acetaminophen, baclofen, ondansetron (ZOFRAN) IV, oxyCODONE-acetaminophen, phenol, polyethylene glycol, sodium chloride, sodium chloride flush  Assessment/Plan:  1. 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 Ratio 1.26 read as equivocal.  SPEP negative - cMRI repeated (11/22): LVEF 43%, RVEF 44%, LGE concerning for sarcoid.  - Echo (4/23): EF 35-40% Moderate RV dysfunction - Has seen geneticist, Dr. Jomarie Longs. Work up c/w LMNA.  - Admitted with NYHA IIIb symptoms and 40 pound weight gain - Echo 11/19 EF 35-40%, LV with GHK, RV mod reduced, mildly elevated PASP, RVSP ~39, trivial MR  - Limited echo 12/3 EF 35-40%, LV with GHK, mild LVH, mild MR, mod TR, RV mildly reduced. Trivial circumferential pericardial effusion present.  - Volume improving. CVP 12 today. Continue lasix gtt, metolazone and diamox. Plan to reassess CVP this afternoon for possible PO transition. Would do 80 BID of Torsemide +KDUR. +PRN metolazone at home.  - Continue Spiro to help with hypokalemia - Continue Jardiance 10 - Has been off b-blocker due to bradycardia and heart block - Off ARNi and ARB (hypotension and N/V in the past) - can rechallenge as able   2. Syncope/VT - 04/23 > secondary to VT - s/p ICD 4/23. - Continue mexiletine 150 mg bid   - Continue amiodarone 200 mg daiy - Followed by Dr. Ladona Ridgel  3. AKI on CKD Stage IIIb - Due to cardiorenal syndrome. Baseline SCr 1.7-2.0. Peaked 3.03 - Overall improving with diuresis. Stable  2.57 today   4. Chronic AF/AFL - S/p AFL ablation 5/19 with Dr. Ladona Ridgel. - Continue Eliquis 5 mg bid and amiodarone 200 daily  - No role for DC-CV as he has been in AFL for a long time   5.  OSA: Continue CPAP   6. PVCs/VT - Continue mexiletine + amiodarone. - Followed by EP   7. Obesity -  Body mass index is Body mass index is 35.73 kg/m. - Consider GLP1RA  8. Hypokalemia - K > 7.5 this morning. Suspect inaccurate. STAT BMET ordered.  - Continue Cleda Daub  9. ID - per primary. WBC peaked at 21.6 with fever. No infective source identified.  - UA (-), PCXR (-), RVP (-), Bcx (-) - Completed IV antibiotics.   10. H/o Gout - R>L knee/leg swelling  - Uric acid 7.0  Cardiac rehab consulted.   Length of Stay: [address] AGACNP-BC  10/19/2023, 8:04 AM  Advanced Heart Failure Team Pager [phone] (M-F; 7a - 4p)  Please contact [facility] for night-coverage after hours (4p -7a ) and weekends on [email or web address]   Patient seen with NP, agree with the above note.   Excellent diuresis again though ?weight not down. CVP lower at 12, co-ox 81%.  Creatinine 2.71 today up from 2.45.   No dyspnea.   General: NAD Neck: JVP 12 cm, no thyromegaly or thyroid nodule.  Lungs: Clear to auscultation bilaterally with normal respiratory effort. CV: Nondisplaced PMI.  Heart irregular S1/S2, no S3/S4, no murmur.  Unna boots on lower legs.  Abdomen: Soft, nontender, no hepatosplenomegaly, no distention.  Skin: Intact without lesions or rashes.  Neurologic: Alert and oriented x 3.  Psych: Normal affect. Extremities: No clubbing or cyanosis.  HEENT: Normal.   Patient with LMNA cardiomyopathy, echo this admission with EF 35-40% and moderate RV dysfunction.  He is still volume overloaded on exam (prominent RV failure) with CVP better at around 12.  Good diuresis yesterday.  Good co-ox.  Creatinine now trending up 2.58 => 2.45 => 2.71. - Continue Lasix gtt 30 mg/hr for now.  He had metolazone this morning and acetazolamide.  Will stop both of these.  - Replace K.  - Acetazolamide 250 mg bid.  - Continue spironolactone 25 mg daily  - Continue Jardiance 10 mg daily.  - Reassess CVP this afternoon, if </= 10, will stop IV Lasix and make switch to torsemide tomorrow.    Atrial  fibrillation/flutter is permanent, continue Eliquis.   Marca Ancona 10/19/2023 11:05 AM

## 2023-10-19 NOTE — Progress Notes (Addendum)
Ambulation deferred d/t lasix  Pt received HF book and was educated on s/s of fluid retention, when to call provider, low na diet, importance of exercise, fluid restriction, and CRP2. Pt provided history and how he manages his symptoms. Pt unlikely to join CR d/t work schedule.   [id number]  Faustino Congress 10/19/2023 12:16 PM

## 2023-10-19 NOTE — Plan of Care (Signed)
  Problem: Health Behavior/Discharge Planning: Goal: Ability to manage health-related needs will improve Outcome: Progressing   Problem: Clinical Measurements: Goal: Will remain free from infection Outcome: Progressing   

## 2023-10-19 NOTE — Progress Notes (Signed)
Orthopedic Tech Progress Note Patient Details:  Bradley Powell [birth year] [id number]  Arrived to apply unna boots, pt states he wants to wait until his parents arrive and are able to see his legs before getting them reapplied. Lafonda Mosses, RN made aware by Helmut Muster (ortho tech) to give [place] a call when he is ready again.   Patient ID: Bradley Powell, male   DOB: 14-May-1981, 42 y.o.   MRN: [id number]  [name] 10/19/2023, 2:41 PM

## 2023-10-19 NOTE — Progress Notes (Signed)
RN provided CHF education of maintaining heart healthy diet and daily weights to patient and patient's parents at bedside. RN explained CVP measurement in regards to the patient's plan of care - per patients request. Patient and patient's family verbalize understanding. RN called ortho tech to apply [employer] per patient's request. Ortho tech to come when available.

## 2023-10-19 NOTE — TOC Progression Note (Signed)
Transition of Care [facility]) - Progression Note    Patient Details  Name: Bradley Powell MRN: [id number] Date of Birth: 11/04/1981  [facility] CM/SW Contact  Nicanor Bake Phone Number: [phone] 10/19/2023, 3:08 PM  Clinical Narrative:   HF CSW met with at bedside. Pt appeared to be in a good mood and was up and active over his computer. Pt stated that his father brought him to the hospital, and one of his family members will be available to transport him at dc. Pt will schedule his own PCP hospital follow up appointment.   TOC will continue following.     Expected Discharge Plan: Home/Self Care Barriers to Discharge: Continued Medical Work up  Expected Discharge Plan and Services       Living arrangements for the past 2 months: Single Family Home                                       Social Determinants of Health (SDOH) Interventions SDOH Screenings   Food Insecurity: No Food Insecurity (09/30/2023)  Housing: Low Risk  (09/30/2023)  Transportation Needs: No Transportation Needs (09/30/2023)  Utilities: Not At Risk (09/30/2023)  Tobacco Use: Low Risk  (09/29/2023)    Readmission Risk Interventions     No data to display

## 2023-10-20 DIAGNOSIS — I5043 Acute on chronic combined systolic (congestive) and diastolic (congestive) heart failure: Secondary | ICD-10-CM | POA: Diagnosis not present

## 2023-10-20 LAB — BASIC METABOLIC PANEL
Anion gap: 13 (ref 5–15)
Anion gap: 15 (ref 5–15)
BUN: 42 mg/dL — ABNORMAL HIGH (ref 6–20)
BUN: 48 mg/dL — ABNORMAL HIGH (ref 6–20)
CO2: 26 mmol/L (ref 22–32)
CO2: 25 mmol/L (ref 22–32)
Calcium: 9 mg/dL (ref 8.9–10.3)
Calcium: 9.6 mg/dL (ref 8.9–10.3)
Chloride: 91 mmol/L — ABNORMAL LOW (ref 98–111)
Chloride: 93 mmol/L — ABNORMAL LOW (ref 98–111)
Creatinine, Ser: 2.59 mg/dL — ABNORMAL HIGH (ref 0.61–1.24)
Creatinine, Ser: 2.59 mg/dL — ABNORMAL HIGH (ref 0.61–1.24)
GFR, Estimated: 31 mL/min — ABNORMAL LOW (ref >60)
GFR, Estimated: 31 mL/min — ABNORMAL LOW (ref >60)
Glucose, Bld: 160 mg/dL — ABNORMAL HIGH (ref 70–99)
Glucose, Bld: 98 mg/dL (ref 70–99)
Potassium: 2.5 mmol/L — CL (ref 3.5–5.1)
Potassium: 4.3 mmol/L (ref 3.5–5.1)
Sodium: 130 mmol/L — ABNORMAL LOW (ref 135–145)
Sodium: 133 mmol/L — ABNORMAL LOW (ref 135–145)

## 2023-10-20 LAB — COOXEMETRY PANEL
Carboxyhemoglobin: 2.3 % — ABNORMAL HIGH (ref 0.5–1.5)
Methemoglobin: <0.7 % (ref 0.0–1.5)
O2 Saturation: 83.7 %
Total hemoglobin: 14.9 g/dL (ref 12.0–16.0)

## 2023-10-20 LAB — MAGNESIUM: Magnesium: 2.3 mg/dL (ref 1.7–2.4)

## 2023-10-20 MED ORDER — POTASSIUM CHLORIDE CRYS ER 20 MEQ PO TBCR
60.0000 meq | EXTENDED_RELEASE_TABLET | Freq: Every day | ORAL | Status: DC
  Administered 2023-10-20 – 2023-10-21 (×4): 60 meq via ORAL
  Filled 2023-10-20 (×4): qty 3

## 2023-10-20 MED ORDER — METOLAZONE 5 MG PO TABS
5.0000 mg | ORAL_TABLET | Freq: Once | ORAL | Status: AC
  Administered 2023-10-20: 5 mg via ORAL
  Filled 2023-10-20: qty 1

## 2023-10-20 MED ORDER — POTASSIUM CHLORIDE 20 MEQ PO PACK
80.0000 meq | PACK | ORAL | Status: AC
  Administered 2023-10-20: 80 meq via ORAL
  Filled 2023-10-20: qty 4

## 2023-10-20 MED ORDER — ACETAZOLAMIDE 250 MG PO TABS
250.0000 mg | ORAL_TABLET | Freq: Two times a day (BID) | ORAL | Status: DC
  Administered 2023-10-20 – 2023-10-22 (×5): 250 mg via ORAL
  Filled 2023-10-20 (×6): qty 1

## 2023-10-20 MED ORDER — POTASSIUM CHLORIDE 10 MEQ/100ML IV SOLN
10.0000 meq | INTRAVENOUS | Status: AC
Start: 2023-10-20 — End: 2023-10-20
  Administered 2023-10-20 (×3): 10 meq via INTRAVENOUS
  Filled 2023-10-20: qty 100

## 2023-10-20 MED ORDER — POTASSIUM CHLORIDE 10 MEQ/100ML IV SOLN: 10.0000 meq | INTRAVENOUS | Status: DC

## 2023-10-20 MED ORDER — POTASSIUM CHLORIDE CRYS ER 20 MEQ PO TBCR
60.0000 meq | EXTENDED_RELEASE_TABLET | Freq: Every day | ORAL | Status: AC
  Administered 2023-10-20: 60 meq via ORAL
  Filled 2023-10-20: qty 3

## 2023-10-20 MED ORDER — POTASSIUM CHLORIDE CRYS ER 20 MEQ PO TBCR
60.0000 meq | EXTENDED_RELEASE_TABLET | Freq: Once | ORAL | Status: AC
  Administered 2023-10-20: 60 meq via ORAL
  Filled 2023-10-20: qty 3

## 2023-10-20 NOTE — Plan of Care (Signed)
Patient progressing on all care plans. CVP 12-15 this shift. Continuing to monitor strict I&O's. Potassium replenished this shift; repeat K+ WNL. Continuing IV lasix as ordered.

## 2023-10-20 NOTE — Progress Notes (Signed)
Pt denies questions from yesterday's teaching.

## 2023-10-20 NOTE — Progress Notes (Signed)
  Hypokalemia-secondary to IV diuretics - Patient's nurse reported that patient's potassium is low 2.5.  Giving oral potassium 80 mEq supplement and IV potassium 42M EQ x 2 doses. -Repeat potassium check around 1 PM and checking magnesium level.

## 2023-10-20 NOTE — Progress Notes (Signed)
PROGRESS NOTE    Bradley Powell  DUK:[id number] DOB: 09-16-1981 DOA: 09/29/2023 PCP: [name], MD  42/M chronic combined CHF, NICM, OSA, obesity, paroxysmal A-fib, to the ED with worsening edema, shortness of breath despite compliance with medications.  In the ED he was massively volume overloaded  -Chest x-ray noted cardiomegaly pulmonary vascular congestion  -CHF team following, on Lasix drip, improving  Subjective: Feels okay overall, no events overnight  Assessment and Plan:  Acute on chronic combined CHF, predominantly RV failure -Echo noted EF of 35-40%, moderately reduced RV  -Diuresed aggressively with IV Lasix, weight down over 68 LB  -Heart failure team following, on Lasix gtt., Diamox, Aldactone, Jardiance  -Increase activity  Severe hypokalemia Replace  Atrial fibrillation, chronic (HCC), history of VT Syncope with ventricular tachycardia.  Sp ICD 02/2022  -On amiodarone and mexiletine -per Cards Afib is permanent, no plan for DCCV  Acute kidney injury superimposed on chronic kidney disease (HCC) CKD stage 3b, Hyponatremia, hypokalemia.  Baseline creatinine 1.7-2 range -Peaked at 3.0, improved, plateaued around 2.5 now  Hepatic cirrhosis (HCC) Continue diuretics as above No signs of encephalopathy.   Gout Will continue with allopurinol and colchicine, he has high uric acid, and with high dose of loop diuretic he is in risk for acute flare.  -on topical diclofenac for osteoarthritis  Obesity, class 2 Calculated BMI is 38.3   Leukocytosis Reactive leukocytosis,.  Patient completed 5 days of antibiotic therapy with cefepime.  No clear source of infection identified  Hypothyroidism Continue with levothyroxine.   Iron deficiency Serum iron of 28, TIBC 424, transferrin saturation 7 and ferritin 129. Plan to add IV iron during this hospitalization.  Hb stable  DVT prophylaxis: Apixaban Code Status: Full code Family Communication: None  present Disposition Plan: Home likely 48h  Consultants:    Procedures:   Antimicrobials:    Objective: Vitals:   10/19/23 2053 10/20/23 0133 10/20/23 0455 10/20/23 0735  BP: 98/76 98/64 99/72  119/83  Pulse: 75 73 74 69  Resp: 18 18 18 18   Temp: 97.9 F (36.6 C) (!) 97.3 F (36.3 C) 98.2 F (36.8 C) (!) 97.4 F (36.3 C)  TempSrc: Oral Oral Oral Oral  SpO2: 98% 98% 100% 100%  Weight:   105.5 kg   Height:        Intake/Output Summary (Last 24 hours) at 10/20/2023 1207 Last data filed at 10/20/2023 3762 Gross per 24 hour  Intake 1408 ml  Output 5500 ml  Net -4092 ml   Filed Weights   10/19/23 0500 10/19/23 0504 10/20/23 0455  Weight: 106.6 kg 106.6 kg 105.5 kg    Examination:  General exam: Obese chronically ill male sitting up in bed, AAOx3 HEENT: Positive JVD CVS: S1-S2, irregular rhythm Lungs: Clear bilaterally Abdomen: Soft, nontender, bowel sounds present Extremities: 1+ edema, Unna boots noted  Psychiatry:  Mood & affect appropriate.     Data Reviewed:   CBC: Recent Labs  Lab 10/14/23 0500 10/15/23 0431 10/16/23 0425  WBC 11.2* 9.3 9.4  HGB 13.0 12.5* 11.9*  HCT 40.8 40.1 38.4*  MCV 81.9 84.2 83.8  PLT 365 387 344   Basic Metabolic Panel: Recent Labs  Lab 10/15/23 0431 10/16/23 0425 10/17/23 0502 10/17/23 1417 10/18/23 0412 10/19/23 0500 10/19/23 0808 10/19/23 1849 10/20/23 0545  NA 131*   < > 135   < > 134* 128* 131* 132* 130*  K 3.3*   < > 2.6*   < > 3.0* >7.5* 3.5 3.5 2.5*  CL  94*   < > 100   < > 97* 92* 88* 90* 91*  CO2 27   < > 24   < > 27 24 27 27 26   GLUCOSE 124*   < > 131*   < > 118* 156* 104* 151* 160*  BUN 51*   < > 51*   < > 51* 47* 47* 46* 42*  CREATININE 2.40*   < > 2.66*   < > 2.45* 2.57* 2.71* 2.58* 2.59*  CALCIUM 9.1   < > 8.6*   < > 9.1 9.3 9.4 9.6 9.0  MG 2.8*  --  2.6*  --  2.7*  --   --   --  2.3   < > = values in this interval not displayed.   GFR: Estimated Creatinine Clearance: 44.6 mL/min (A) (by C-G  formula based on SCr of 2.59 mg/dL (H)). Liver Function Tests: No results for input(s): "AST", "ALT", "ALKPHOS", "BILITOT", "PROT", "ALBUMIN" in the last 168 hours.  No results for input(s): "LIPASE", "AMYLASE" in the last 168 hours. No results for input(s): "AMMONIA" in the last 168 hours. Coagulation Profile: No results for input(s): "INR", "PROTIME" in the last 168 hours. Cardiac Enzymes: No results for input(s): "CKTOTAL", "CKMB", "CKMBINDEX", "TROPONINI" in the last 168 hours. BNP (last 3 results) No results for input(s): "PROBNP" in the last 8760 hours. HbA1C: No results for input(s): "HGBA1C" in the last 72 hours. CBG: No results for input(s): "GLUCAP" in the last 168 hours. Lipid Profile: No results for input(s): "CHOL", "HDL", "LDLCALC", "TRIG", "CHOLHDL", "LDLDIRECT" in the last 72 hours. Thyroid Function Tests: No results for input(s): "TSH", "T4TOTAL", "FREET4", "T3FREE", "THYROIDAB" in the last 72 hours. Anemia Panel: No results for input(s): "VITAMINB12", "FOLATE", "FERRITIN", "TIBC", "IRON", "RETICCTPCT" in the last 72 hours. Urine analysis:    Component Value Date/Time   COLORURINE AMBER (A) 10/05/2023 0230   APPEARANCEUR CLEAR 10/05/2023 0230   LABSPEC 1.008 10/05/2023 0230   PHURINE 7.0 10/05/2023 0230   GLUCOSEU >=500 (A) 10/05/2023 0230   HGBUR NEGATIVE 10/05/2023 0230   BILIRUBINUR NEGATIVE 10/05/2023 0230   KETONESUR NEGATIVE 10/05/2023 0230   PROTEINUR NEGATIVE 10/05/2023 0230   NITRITE NEGATIVE 10/05/2023 0230   LEUKOCYTESUR NEGATIVE 10/05/2023 0230   Sepsis Labs: @LABRCNTIP (procalcitonin:4,lacticidven:4)  )No results found for this or any previous visit (from the past 240 hour(s)).   Radiology Studies: No results found.   Scheduled Meds:  acetaZOLAMIDE  250 mg Oral BID   allopurinol  200 mg Oral Daily   amiodarone  200 mg Oral Daily   apixaban  5 mg Oral BID   Chlorhexidine Gluconate Cloth  6 each Topical Daily   colchicine  0.3 mg Oral  Daily   diclofenac Sodium  2 g Topical QID   empagliflozin  10 mg Oral Daily   levothyroxine  50 mcg Oral Q0600   metolazone  5 mg Oral Once   mexiletine  150 mg Oral Q12H   pantoprazole  40 mg Oral BID   polyethylene glycol  17 g Oral Daily   potassium chloride  60 mEq Oral 6 X Daily   sodium chloride flush  10-40 mL Intracatheter Q12H   spironolactone  25 mg Oral Daily   Continuous Infusions:  furosemide (LASIX) 200 mg in dextrose 5 % 100 mL (2 mg/mL) infusion 30 mg/hr (10/20/23 0545)     LOS: 20 days    Time spent:    Zannie Cove, MD [facility]   10/20/2023, 12:07 PM

## 2023-10-20 NOTE — Progress Notes (Addendum)
Advanced Heart Failure Rounding Note   Subjective:   Diuresed with lasix drip, metolazone, and diamox.   Overall weight down 71 pounds. -5.5L UOP  CO-OX 84%. CVP 15?  K 2.5  Feels good no complaints.   Objective:   Echo 11/19: EF 35-40%, LV with GHK, RV mod reduced, mildly elevated PASP, RVSP ~39, trivial MR   Weight Range:  Vital Signs:   Temp:  [97.2 F (36.2 C)-98.2 F (36.8 C)] 98.2 F (36.8 C) (12/06 0455) Pulse Rate:  [66-75] 74 (12/06 0455) Resp:  [16-18] 18 (12/06 0455) BP: (97-101)/(64-76) 99/72 (12/06 0455) SpO2:  [98 %-100 %] 100 % (12/06 0455) Weight:  [105.5 kg] 105.5 kg (12/06 0455) Last BM Date : 10/19/23  Weight change: Filed Weights   10/19/23 0500 10/19/23 0504 10/20/23 0455  Weight: 106.6 kg 106.6 kg 105.5 kg   Intake/Output:   Intake/Output Summary (Last 24 hours) at 10/20/2023 0725 Last data filed at 10/20/2023 0700 Gross per 24 hour  Intake 1443 ml  Output 5500 ml  Net -4057 ml    CVP 15? Physical Exam: General:  well appearing.  No respiratory difficulty HEENT: normal Neck: supple. JVD elevated. Carotids 2+ bilat; no bruits. No lymphadenopathy or thyromegaly appreciated. Cor: PMI nondisplaced. Regular rate & irregular rhythm. No rubs, gallops or murmurs. Lungs: clear Abdomen: soft, nontender, nondistended. No hepatosplenomegaly. No bruits or masses. Good bowel sounds. Extremities: no cyanosis, clubbing, rash, +1 RLE edema. + UNNA boots  Neuro: alert & oriented x 3, cranial nerves grossly intact. moves all 4 extremities w/o difficulty. Affect pleasant.   Telemetry: A fib/flutter 70s (Personally reviewed)    Labs: Basic Metabolic Panel: Recent Labs  Lab 10/15/23 0431 10/16/23 0425 10/17/23 0502 10/17/23 1417 10/18/23 0412 10/19/23 0500 10/19/23 0808 10/19/23 1849 10/20/23 0545  NA 131*   < > 135   < > 134* 128* 131* 132* 130*  K 3.3*   < > 2.6*   < > 3.0* >7.5* 3.5 3.5 2.5*  CL 94*   < > 100   < > 97* 92* 88* 90* 91*   CO2 27   < > 24   < > 27 24 27 27 26   GLUCOSE 124*   < > 131*   < > 118* 156* 104* 151* 160*  BUN 51*   < > 51*   < > 51* 47* 47* 46* 42*  CREATININE 2.40*   < > 2.66*   < > 2.45* 2.57* 2.71* 2.58* 2.59*  CALCIUM 9.1   < > 8.6*   < > 9.1 9.3 9.4 9.6 9.0  MG 2.8*  --  2.6*  --  2.7*  --   --   --   --    < > = values in this interval not displayed.   Liver Function Tests: No results for input(s): "AST", "ALT", "ALKPHOS", "BILITOT", "PROT", "ALBUMIN" in the last 168 hours.   No results for input(s): "LIPASE", "AMYLASE" in the last 168 hours. No results for input(s): "AMMONIA" in the last 168 hours.  CBC: Recent Labs  Lab 10/14/23 0500 10/15/23 0431 10/16/23 0425  WBC 11.2* 9.3 9.4  HGB 13.0 12.5* 11.9*  HCT 40.8 40.1 38.4*  MCV 81.9 84.2 83.8  PLT 365 387 344   Cardiac Enzymes: No results for input(s): "CKTOTAL", "CKMB", "CKMBINDEX", "TROPONINI" in the last 168 hours.  BNP: BNP (last 3 results) Recent Labs    05/23/23 1618 09/30/23 0513 10/13/23 0300  BNP 209.9* 222.1* 285.0*  ProBNP (last 3 results) No results for input(s): "PROBNP" in the last 8760 hours.  Imaging: No results found.  Medications:    Scheduled Medications:  allopurinol  200 mg Oral Daily   amiodarone  200 mg Oral Daily   apixaban  5 mg Oral BID   Chlorhexidine Gluconate Cloth  6 each Topical Daily   colchicine  0.3 mg Oral Daily   diclofenac Sodium  2 g Topical QID   empagliflozin  10 mg Oral Daily   levothyroxine  50 mcg Oral Q0600   mexiletine  150 mg Oral Q12H   pantoprazole  40 mg Oral BID   polyethylene glycol  17 g Oral Daily   potassium chloride  60 mEq Oral Once   potassium chloride  60 mEq Oral 6 X Daily   sodium chloride flush  10-40 mL Intracatheter Q12H   spironolactone  25 mg Oral Daily    Infusions:  furosemide (LASIX) 200 mg in dextrose 5 % 100 mL (2 mg/mL) infusion 30 mg/hr (10/20/23 0545)   potassium chloride      PRN Medications: acetaminophen, baclofen,  ondansetron (ZOFRAN) IV, oxyCODONE-acetaminophen, phenol, sodium chloride, sodium chloride flush  Assessment/Plan:  1. 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 Ratio 1.26 read as equivocal.  SPEP negative - cMRI repeated (11/22): LVEF 43%, RVEF 44%, LGE concerning for sarcoid.  - Echo (4/23): EF 35-40% Moderate RV dysfunction - Has seen geneticist, Dr. Jomarie Longs. Work up c/w LMNA.  - Admitted with NYHA IIIb symptoms and 40 pound weight gain - Echo 11/19 EF 35-40%, LV with GHK, RV mod reduced, mildly elevated PASP, RVSP ~39, trivial MR  - Limited echo 12/3 EF 35-40%, LV with GHK, mild LVH, mild MR, mod TR, RV mildly reduced. Trivial circumferential pericardial effusion present.  - Volume improving. CVP 15? today. Continue lasix gtt. Hold metolazone and diamox. - Continue Spiro to help with hypokalemia - Continue Jardiance 10 - Has been off b-blocker due to bradycardia and heart block - Off ARNi and ARB (hypotension and N/V in the past) - can rechallenge as able   2. Syncope/VT - 04/23 > secondary to VT - s/p ICD 4/23. - Continue mexiletine 150 mg bid   - Continue amiodarone 200 mg daiy - Followed by Dr. Ladona Ridgel  3. AKI on CKD Stage IIIb - Due to cardiorenal syndrome. Baseline SCr 1.7-2.0. Peaked 3.03 - Overall improving with diuresis. Stable  2.57 today   4. Chronic AF/AFL - S/p AFL ablation 5/19 with Dr. Ladona Ridgel. - Continue Eliquis 5 mg bid and amiodarone 200 daily  - No role for DC-CV as he has been in AFL for a long time   5.  OSA: Continue CPAP   6. PVCs/VT - Continue mexiletine + amiodarone. - Followed by EP   7. Obesity - Body mass index is Body mass index is 35.36 kg/m. - Consider GLP1RA  8. Hypokalemia - K <2.5 this morning. - Aggressive repletion ordered - Continue Spiro  9. ID - per primary. WBC peaked at 21.6 with fever. No infective source identified.  - UA (-), PCXR (-),  RVP (-), Bcx (-) - Completed IV antibiotics.   10. H/o Gout - R>L knee/leg swelling  - Uric acid 7.0  Cardiac rehab consulted.   Length of Stay: 20  Alma Merlene Morse AGACNP-BC  10/20/2023, 7:25 AM  Advanced Heart Failure Team Pager [phone] (M-F; 7a - 4p)  Please contact [facility] for night-coverage after hours (4p -7a ) and weekends on [email or web address]   Patient seen with NP, agree with the above note.   Good diuresis again.  Weight down 3 more lbs.  However, CVP still 17 on my read.  Creatinine stable at 2.57.   He is feeling better, it is easier for him to walk around with significant weight loss.   General: NAD Neck: JVP 14-16 cm, no thyromegaly or thyroid nodule.  Lungs: Clear to auscultation bilaterally with normal respiratory effort. CV: Nondisplaced PMI.  Heart regular S1/S2, no S3/S4, no murmur.  1+ edema to knees.  Abdomen: Soft, nontender, no hepatosplenomegaly, no distention.  Skin: Intact without lesions or rashes.  Neurologic: Alert and oriented x 3.  Psych: Normal affect. Extremities: No clubbing or cyanosis.  HEENT: Normal.   Patient with LMNA cardiomyopathy, echo this admission with EF 35-40% and moderate RV dysfunction.  He is still volume overloaded on exam (prominent RV failure) with CVP still 17.  Good diuresis yesterday, weight continues to come down.  Good co-ox.  Creatinine holding steady 2.58 => 2.45 => 2.71 => 2.57. - Continue Lasix gtt 30 mg/hr for now.  Will give acetazolamide 250 mg bid today.  - Aggressive K replacement with pm BMET.  - Continue spironolactone 25 mg daily  - Continue Jardiance 10 mg daily.    Atrial fibrillation/flutter is permanent, continue Eliquis.   Marca Ancona 10/20/2023 12:02 PM

## 2023-10-21 DIAGNOSIS — I5043 Acute on chronic combined systolic (congestive) and diastolic (congestive) heart failure: Secondary | ICD-10-CM | POA: Diagnosis not present

## 2023-10-21 LAB — BASIC METABOLIC PANEL
Anion gap: 18 — ABNORMAL HIGH (ref 5–15)
Anion gap: 13 (ref 5–15)
BUN: 46 mg/dL — ABNORMAL HIGH (ref 6–20)
BUN: 48 mg/dL — ABNORMAL HIGH (ref 6–20)
CO2: 25 mmol/L (ref 22–32)
CO2: 28 mmol/L (ref 22–32)
Calcium: 9.3 mg/dL (ref 8.9–10.3)
Calcium: 9.3 mg/dL (ref 8.9–10.3)
Chloride: 92 mmol/L — ABNORMAL LOW (ref 98–111)
Chloride: 91 mmol/L — ABNORMAL LOW (ref 98–111)
Creatinine, Ser: 2.6 mg/dL — ABNORMAL HIGH (ref 0.61–1.24)
Creatinine, Ser: 2.8 mg/dL — ABNORMAL HIGH (ref 0.61–1.24)
GFR, Estimated: 31 mL/min — ABNORMAL LOW (ref >60)
GFR, Estimated: 28 mL/min — ABNORMAL LOW (ref >60)
Glucose, Bld: 111 mg/dL — ABNORMAL HIGH (ref 70–99)
Glucose, Bld: 112 mg/dL — ABNORMAL HIGH (ref 70–99)
Potassium: 2.9 mmol/L — ABNORMAL LOW (ref 3.5–5.1)
Potassium: 3.4 mmol/L — ABNORMAL LOW (ref 3.5–5.1)
Sodium: 135 mmol/L (ref 135–145)
Sodium: 132 mmol/L — ABNORMAL LOW (ref 135–145)

## 2023-10-21 LAB — COOXEMETRY PANEL
Carboxyhemoglobin: 2.6 % — ABNORMAL HIGH (ref 0.5–1.5)
Methemoglobin: <0.7 % (ref 0.0–1.5)
O2 Saturation: 99.1 %
Total hemoglobin: 15.1 g/dL (ref 12.0–16.0)

## 2023-10-21 LAB — MAGNESIUM: Magnesium: 2.4 mg/dL (ref 1.7–2.4)

## 2023-10-21 MED ORDER — METOLAZONE 5 MG PO TABS
5.0000 mg | ORAL_TABLET | Freq: Once | ORAL | Status: AC
  Administered 2023-10-21: 5 mg via ORAL
  Filled 2023-10-21: qty 1

## 2023-10-21 MED ORDER — ORAL CARE MOUTH RINSE: 15.0000 mL | OROMUCOSAL | Status: DC | PRN

## 2023-10-21 MED ORDER — POTASSIUM CHLORIDE 10 MEQ/100ML IV SOLN
10.0000 meq | INTRAVENOUS | Status: AC
  Administered 2023-10-21 (×3): 10 meq via INTRAVENOUS
  Filled 2023-10-21 (×3): qty 100

## 2023-10-21 MED ORDER — POTASSIUM CHLORIDE CRYS ER 20 MEQ PO TBCR
80.0000 meq | EXTENDED_RELEASE_TABLET | Freq: Every day | ORAL | Status: DC
  Administered 2023-10-21 – 2023-10-22 (×6): 80 meq via ORAL
  Filled 2023-10-21 (×6): qty 4

## 2023-10-21 MED ORDER — POTASSIUM CHLORIDE 10 MEQ/100ML IV SOLN
10.0000 meq | INTRAVENOUS | Status: AC
  Administered 2023-10-21 (×4): 10 meq via INTRAVENOUS
  Filled 2023-10-21 (×4): qty 100

## 2023-10-21 NOTE — Progress Notes (Signed)
Pt declined walk at this time. He states he likes to walk in the evenings. We will continue to follow.

## 2023-10-21 NOTE — Progress Notes (Signed)
PROGRESS NOTE    Bradley Powell  ZOX:[id number] DOB: June 10, 1981 DOA: 09/29/2023 PCP: [name], MD  42/M chronic combined CHF, NICM, OSA, obesity, paroxysmal A-fib, to the ED with worsening edema, shortness of breath despite compliance with medications.  In the ED he was massively volume overloaded  -Chest x-ray noted cardiomegaly pulmonary vascular congestion  -CHF team following, on Lasix drip, improving  Subjective: Night, potassium low again today  Assessment and Plan:  Acute on chronic combined CHF, predominantly RV failure -Echo noted EF of 35-40%, moderately reduced RV  -Diuresed aggressively with IV Lasix, weight down over 68 LB  -Heart failure team following, on Lasix gtt., Diamox, Aldactone, Jardiance, he is down 69 LB -Increase activity  Severe hypokalemia -Continue aggressive replacement, on Aldactone  Atrial fibrillation, chronic (HCC), history of VT Syncope with ventricular tachycardia.  Sp ICD 02/2022  -On amiodarone and mexiletine -per Cards Afib is permanent, no plan for DCCV  Acute kidney injury superimposed on chronic kidney disease (HCC) CKD stage 3b, Hyponatremia, hypokalemia.  Baseline creatinine 1.7-2 range -Peaked at 3.0, improved, plateaued around 2.5 range  Hepatic cirrhosis (HCC) Continue diuretics as above No signs of encephalopathy.   Gout -continue no evidence of allopurinol and colchicine,  Obesity, class 2 Calculated BMI is 38.3   Leukocytosis Reactive leukocytosis,.  Patient completed 5 days of antibiotic therapy with cefepime.  No clear source of infection identified  Hypothyroidism Continue with levothyroxine.   Iron deficiency Serum iron of 28, TIBC 424, transferrin saturation 7 and ferritin 129. Hemoglobin stable  DVT prophylaxis: Apixaban Code Status: Full code Family Communication: None present Disposition Plan: Home likely 48h  Consultants:    Procedures:   Antimicrobials:    Objective: Vitals:    10/21/23 0143 10/21/23 0530 10/21/23 0805 10/21/23 1235  BP: 104/71 95/67 95/67  105/70  Pulse: 78 71 80 79  Resp: 18 17 18 20   Temp: (!) 97.5 F (36.4 C) 97.6 F (36.4 C) 98.3 F (36.8 C) (!) 97.5 F (36.4 C)  TempSrc: Oral Oral Oral Oral  SpO2: 100% 99% 100% 99%  Weight:  105 kg    Height:        Intake/Output Summary (Last 24 hours) at 10/21/2023 1332 Last data filed at 10/21/2023 1013 Gross per 24 hour  Intake 2922.92 ml  Output 5375 ml  Net -2452.08 ml   Filed Weights   10/19/23 0504 10/20/23 0455 10/21/23 0530  Weight: 106.6 kg 105.5 kg 105 kg    Examination:  General exam: Obese chronically ill male sitting up in bed, AAOx3 HEENT: Positive JVD CVS: S1-S2, irregular rhythm Lungs: Clear bilaterally  Abdomen: Soft, nontender, bowel sounds present Extremities: 1+ edema, Unna boots noted  Psychiatry:  Mood & affect appropriate.     Data Reviewed:   CBC: Recent Labs  Lab 10/15/23 0431 10/16/23 0425  WBC 9.3 9.4  HGB 12.5* 11.9*  HCT 40.1 38.4*  MCV 84.2 83.8  PLT 387 344   Basic Metabolic Panel: Recent Labs  Lab 10/15/23 0431 10/16/23 0425 10/17/23 0502 10/17/23 1417 10/18/23 0412 10/19/23 0500 10/19/23 0808 10/19/23 1849 10/20/23 0545 10/20/23 1230 10/21/23 0520  NA 131*   < > 135   < > 134*   < > 131* 132* 130* 133* 135  K 3.3*   < > 2.6*   < > 3.0*   < > 3.5 3.5 2.5* 4.3 2.9*  CL 94*   < > 100   < > 97*   < > 88* 90* 91*  93* 92*  CO2 27   < > 24   < > 27   < > 27 27 26 25 25   GLUCOSE 124*   < > 131*   < > 118*   < > 104* 151* 160* 98 111*  BUN 51*   < > 51*   < > 51*   < > 47* 46* 42* 48* 46*  CREATININE 2.40*   < > 2.66*   < > 2.45*   < > 2.71* 2.58* 2.59* 2.59* 2.60*  CALCIUM 9.1   < > 8.6*   < > 9.1   < > 9.4 9.6 9.0 9.6 9.3  MG 2.8*  --  2.6*  --  2.7*  --   --   --  2.3  --   --    < > = values in this interval not displayed.   GFR: Estimated Creatinine Clearance: 44.3 mL/min (A) (by C-G formula based on SCr of 2.6 mg/dL  (H)). Liver Function Tests: No results for input(s): "AST", "ALT", "ALKPHOS", "BILITOT", "PROT", "ALBUMIN" in the last 168 hours.  No results for input(s): "LIPASE", "AMYLASE" in the last 168 hours. No results for input(s): "AMMONIA" in the last 168 hours. Coagulation Profile: No results for input(s): "INR", "PROTIME" in the last 168 hours. Cardiac Enzymes: No results for input(s): "CKTOTAL", "CKMB", "CKMBINDEX", "TROPONINI" in the last 168 hours. BNP (last 3 results) No results for input(s): "PROBNP" in the last 8760 hours. HbA1C: No results for input(s): "HGBA1C" in the last 72 hours. CBG: No results for input(s): "GLUCAP" in the last 168 hours. Lipid Profile: No results for input(s): "CHOL", "HDL", "LDLCALC", "TRIG", "CHOLHDL", "LDLDIRECT" in the last 72 hours. Thyroid Function Tests: No results for input(s): "TSH", "T4TOTAL", "FREET4", "T3FREE", "THYROIDAB" in the last 72 hours. Anemia Panel: No results for input(s): "VITAMINB12", "FOLATE", "FERRITIN", "TIBC", "IRON", "RETICCTPCT" in the last 72 hours. Urine analysis:    Component Value Date/Time   COLORURINE AMBER (A) 10/05/2023 0230   APPEARANCEUR CLEAR 10/05/2023 0230   LABSPEC 1.008 10/05/2023 0230   PHURINE 7.0 10/05/2023 0230   GLUCOSEU >=500 (A) 10/05/2023 0230   HGBUR NEGATIVE 10/05/2023 0230   BILIRUBINUR NEGATIVE 10/05/2023 0230   KETONESUR NEGATIVE 10/05/2023 0230   PROTEINUR NEGATIVE 10/05/2023 0230   NITRITE NEGATIVE 10/05/2023 0230   LEUKOCYTESUR NEGATIVE 10/05/2023 0230   Sepsis Labs: @LABRCNTIP (procalcitonin:4,lacticidven:4)  )No results found for this or any previous visit (from the past 240 hour(s)).   Radiology Studies: No results found.   Scheduled Meds:  acetaZOLAMIDE  250 mg Oral BID   allopurinol  200 mg Oral Daily   amiodarone  200 mg Oral Daily   apixaban  5 mg Oral BID   Chlorhexidine Gluconate Cloth  6 each Topical Daily   colchicine  0.3 mg Oral Daily   diclofenac Sodium  2 g  Topical QID   empagliflozin  10 mg Oral Daily   levothyroxine  50 mcg Oral Q0600   mexiletine  150 mg Oral Q12H   pantoprazole  40 mg Oral BID   polyethylene glycol  17 g Oral Daily   potassium chloride  80 mEq Oral 6 X Daily   sodium chloride flush  10-40 mL Intracatheter Q12H   spironolactone  25 mg Oral Daily   Continuous Infusions:  furosemide (LASIX) 200 mg in dextrose 5 % 100 mL (2 mg/mL) infusion 30 mg/hr (10/21/23 1048)   potassium chloride 10 mEq (10/21/23 1239)     LOS: 21 days  Time spent:    Zannie Cove, MD [facility]   10/21/2023, 1:32 PM

## 2023-10-21 NOTE — Plan of Care (Signed)
Patient progressing on all care plans. Patient remains on IV lasix gtt as ordered. Continuing to replace K+ as ordered. K 2.9 this AM and 3.4 following ordered Kcl runs. Patient now receiving additional IV Kcl. Pain to R knee well controlled with voltaren gel as ordered. Plan of care discussed with patient and all questions and concerns addressed.

## 2023-10-21 NOTE — Progress Notes (Signed)
Orthopedic Tech Progress Note Patient Details:  Bradley Powell June 19, 1981 [id number]  RN called requesting per patient to have his UNNA BOOTS reapplied on the right leg. I told her patient had just got unna boots applied Thursday and that if morning shifted removed and cleaned legs that we could apply a fresh pair  Patient ID: Bradley Powell, male   DOB: 25-Jul-1981, 42 y.o.   MRN: [id number]  [name] 10/21/2023, 3:12 AM

## 2023-10-21 NOTE — Plan of Care (Signed)
  Problem: Education: Goal: Knowledge of General Education information will improve Description: Including pain rating scale, medication(s)/side effects and non-pharmacologic comfort measures Outcome: Progressing   Problem: Health Behavior/Discharge Planning: Goal: Ability to manage health-related needs will improve Outcome: Progressing   Problem: Clinical Measurements: Goal: Ability to maintain clinical measurements within normal limits will improve Outcome: Progressing Goal: Will remain free from infection Outcome: Progressing Goal: Diagnostic test results will improve Outcome: Progressing Goal: Cardiovascular complication will be avoided Outcome: Progressing   Problem: Activity: Goal: Risk for activity intolerance will decrease Outcome: Progressing   Problem: Education: Goal: Ability to demonstrate management of disease process will improve Outcome: Progressing Goal: Ability to verbalize understanding of medication therapies will improve Outcome: Progressing Goal: Individualized Educational Video(s) Outcome: Progressing   Problem: Activity: Goal: Capacity to carry out activities will improve Outcome: Progressing   Problem: Cardiac: Goal: Ability to achieve and maintain adequate cardiopulmonary perfusion will improve Outcome: Progressing

## 2023-10-21 NOTE — Progress Notes (Signed)
Patient ID: Bradley Powell, male   DOB: 1981-08-03, 42 y.o.   MRN: [id number]    Advanced Heart Failure Rounding Note   Subjective:    Diuresed with Lasix drip 30 mg/hr and Diamox yesterday.  I/Os net negative 2330, weight only down 1 lb.  CVP still 15.   K low at 2.9 today despite aggressive K repletion.   Objective:   Echo 11/19: EF 35-40%, LV with GHK, RV mod reduced, mildly elevated PASP, RVSP ~39, trivial MR   Weight Range:  Vital Signs:   Temp:  [97.5 F (36.4 C)-98.4 F (36.9 C)] 98.3 F (36.8 C) (12/07 0805) Pulse Rate:  [71-80] 80 (12/07 0805) Resp:  [17-20] 18 (12/07 0805) BP: (95-106)/(67-72) 95/67 (12/07 0805) SpO2:  [99 %-100 %] 100 % (12/07 0805) Weight:  [105 kg] 105 kg (12/07 0530) Last BM Date : 10/20/23  Weight change: Filed Weights   10/19/23 0504 10/20/23 0455 10/21/23 0530  Weight: 106.6 kg 105.5 kg 105 kg   Intake/Output:   Intake/Output Summary (Last 24 hours) at 10/21/2023 0923 Last data filed at 10/21/2023 0838 Gross per 24 hour  Intake 2924.92 ml  Output 5375 ml  Net -2450.08 ml    CVP 15 Physical Exam: General: NAD Neck: JVP 14-16 cm, no thyromegaly or thyroid nodule.  Lungs: Clear to auscultation bilaterally with normal respiratory effort. CV: Nondisplaced PMI.  Heart regular S1/S2, no S3/S4, no murmur.  Lower legs wrapped.  Abdomen: Soft, nontender, no hepatosplenomegaly, no distention.  Skin: Intact without lesions or rashes.  Neurologic: Alert and oriented x 3.  Psych: Normal affect. Extremities: No clubbing or cyanosis.  HEENT: Normal.    Telemetry: A fib/flutter 70s (Personally reviewed)    Labs: Basic Metabolic Panel: Recent Labs  Lab 10/15/23 0431 10/16/23 0425 10/17/23 0502 10/17/23 1417 10/18/23 0412 10/19/23 0500 10/19/23 0808 10/19/23 1849 10/20/23 0545 10/20/23 1230 10/21/23 0520  NA 131*   < > 135   < > 134*   < > 131* 132* 130* 133* 135  K 3.3*   < > 2.6*   < > 3.0*   < > 3.5 3.5 2.5* 4.3 2.9*  CL  94*   < > 100   < > 97*   < > 88* 90* 91* 93* 92*  CO2 27   < > 24   < > 27   < > 27 27 26 25 25   GLUCOSE 124*   < > 131*   < > 118*   < > 104* 151* 160* 98 111*  BUN 51*   < > 51*   < > 51*   < > 47* 46* 42* 48* 46*  CREATININE 2.40*   < > 2.66*   < > 2.45*   < > 2.71* 2.58* 2.59* 2.59* 2.60*  CALCIUM 9.1   < > 8.6*   < > 9.1   < > 9.4 9.6 9.0 9.6 9.3  MG 2.8*  --  2.6*  --  2.7*  --   --   --  2.3  --   --    < > = values in this interval not displayed.   Liver Function Tests: No results for input(s): "AST", "ALT", "ALKPHOS", "BILITOT", "PROT", "ALBUMIN" in the last 168 hours.   No results for input(s): "LIPASE", "AMYLASE" in the last 168 hours. No results for input(s): "AMMONIA" in the last 168 hours.  CBC: Recent Labs  Lab 10/15/23 0431 10/16/23 0425  WBC 9.3 9.4  HGB 12.5*  11.9*  HCT 40.1 38.4*  MCV 84.2 83.8  PLT 387 344   Cardiac Enzymes: No results for input(s): "CKTOTAL", "CKMB", "CKMBINDEX", "TROPONINI" in the last 168 hours.  BNP: BNP (last 3 results) Recent Labs    05/23/23 1618 09/30/23 0513 10/13/23 0300  BNP 209.9* 222.1* 285.0*   ProBNP (last 3 results) No results for input(s): "PROBNP" in the last 8760 hours.  Imaging: No results found.  Medications:    Scheduled Medications:  acetaZOLAMIDE  250 mg Oral BID   allopurinol  200 mg Oral Daily   amiodarone  200 mg Oral Daily   apixaban  5 mg Oral BID   Chlorhexidine Gluconate Cloth  6 each Topical Daily   colchicine  0.3 mg Oral Daily   diclofenac Sodium  2 g Topical QID   empagliflozin  10 mg Oral Daily   levothyroxine  50 mcg Oral Q0600   metolazone  5 mg Oral Once   mexiletine  150 mg Oral Q12H   pantoprazole  40 mg Oral BID   polyethylene glycol  17 g Oral Daily   potassium chloride  80 mEq Oral 6 X Daily   sodium chloride flush  10-40 mL Intracatheter Q12H   spironolactone  25 mg Oral Daily    Infusions:  furosemide (LASIX) 200 mg in dextrose 5 % 100 mL (2 mg/mL) infusion 30 mg/hr  (10/21/23 0400)   potassium chloride 10 mEq (10/21/23 0838)    PRN Medications: acetaminophen, baclofen, ondansetron (ZOFRAN) IV, mouth rinse, oxyCODONE-acetaminophen, phenol, sodium chloride, sodium chloride flush  Assessment/Plan:   1. 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 Ratio 1.26 read as equivocal.  SPEP negative - cMRI repeated (11/22): LVEF 43%, RVEF 44%, LGE concerning for sarcoid.  - Echo (4/23): EF 35-40% Moderate RV dysfunction - Has seen geneticist, Dr. Jomarie Longs. Work up c/w LMNA.  - Admitted with NYHA IIIb symptoms and 40 pound weight gain - Echo 11/19 EF 35-40%, LV with GHK, RV mod reduced, mildly elevated PASP, RVSP ~39, trivial MR  - Limited echo 12/3 EF 35-40%, LV with GHK, mild LVH, mild MR, mod TR, RV mildly reduced. Trivial circumferential pericardial effusion present.  - Patient with LMNA cardiomyopathy. He is still volume overloaded on exam (prominent RV failure) with CVP still 15.  Co-ox has been good.  Weight continues to trend down.  Creatinine holding steady 2.58 => 2.45 => 2.71 => 2.57 => 2.6. - Continue spironolactone to help with hypokalemia - Continue Jardiance 10 - Has been off b-blocker due to bradycardia and heart block - Off ARNi and ARB (hypotension and N/V in the past) - can rechallenge as able - Increase KCl to 80 6 x /day and will give IV KCl to day with ongoing hypokalemia despite aggressive repletion.  Repeat BMET in pm.  - Today will continue Lasix gtt 30 mg/hr, give metolazone 5 x 1 and acetazolamide 250 bid.  I told him that we would continue diuresis through weekend, on Monday will try to get him to po.  Will accept CVP in 12-13 range.    2. Syncope/VT - 04/23 > secondary to VT - s/p ICD 4/23. - Continue mexiletine 150 mg bid   - Continue amiodarone 200 mg daiy - Followed by Dr. Ladona Ridgel  3. AKI on CKD Stage IIIb - Due to cardiorenal syndrome in setting  of RV failure. Baseline SCr 1.7-2.0. Peaked 3.03 - Stable  2.6 today   4. Permanent AF/AFL - S/p AFL ablation 5/19 with Dr. Ladona Ridgel. - Continue Eliquis 5 mg bid and amiodarone 200 daily  - No role for DC-CV as he has been in AFL for a long time   5.  OSA: Continue CPAP   6. PVCs/VT - Continue mexiletine + amiodarone. - Followed by EP   7. Obesity - Body mass index is 35.2 kg/m. - Consider GLP1RA  8. Hypokalemia - K 2.9 this morning. - Aggressive repletion ordered - Continue Spiro - Repeat BMET in pm.   9. ID - per primary. WBC peaked at 21.6 with fever. No infective source identified.  - UA (-), PCXR (-), RVP (-), Bcx (-) - Completed IV antibiotics.   10. H/o Gout - R>L knee/leg swelling  - Uric acid 7.0   Marca Ancona 10/21/2023 9:23 AM

## 2023-10-21 NOTE — Progress Notes (Signed)
HF team increase the maintenance KCL dose today. K came back 3.4 after IV replacement. D/w Dr. Jomarie Longs and we will give an additional IV in addition to the increase maintenance dose.  Ulyses Southward, PharmD, BCIDP, AAHIVP, CPP Infectious Disease Pharmacist 10/21/2023 4:16 PM

## 2023-10-22 DIAGNOSIS — I5043 Acute on chronic combined systolic (congestive) and diastolic (congestive) heart failure: Secondary | ICD-10-CM | POA: Diagnosis not present

## 2023-10-22 LAB — BASIC METABOLIC PANEL
Anion gap: 14 (ref 5–15)
Anion gap: 14 (ref 5–15)
BUN: 50 mg/dL — ABNORMAL HIGH (ref 6–20)
BUN: 48 mg/dL — ABNORMAL HIGH (ref 6–20)
CO2: 24 mmol/L (ref 22–32)
CO2: 24 mmol/L (ref 22–32)
Calcium: 9.1 mg/dL (ref 8.9–10.3)
Calcium: 9.9 mg/dL (ref 8.9–10.3)
Chloride: 91 mmol/L — ABNORMAL LOW (ref 98–111)
Chloride: 94 mmol/L — ABNORMAL LOW (ref 98–111)
Creatinine, Ser: 2.68 mg/dL — ABNORMAL HIGH (ref 0.61–1.24)
Creatinine, Ser: 2.64 mg/dL — ABNORMAL HIGH (ref 0.61–1.24)
GFR, Estimated: 30 mL/min — ABNORMAL LOW (ref >60)
GFR, Estimated: 30 mL/min — ABNORMAL LOW (ref >60)
Glucose, Bld: 264 mg/dL — ABNORMAL HIGH (ref 70–99)
Glucose, Bld: 122 mg/dL — ABNORMAL HIGH (ref 70–99)
Potassium: 2.9 mmol/L — ABNORMAL LOW (ref 3.5–5.1)
Potassium: 5 mmol/L (ref 3.5–5.1)
Sodium: 129 mmol/L — ABNORMAL LOW (ref 135–145)
Sodium: 132 mmol/L — ABNORMAL LOW (ref 135–145)

## 2023-10-22 LAB — COOXEMETRY PANEL
Carboxyhemoglobin: 1.9 % — ABNORMAL HIGH (ref 0.5–1.5)
Methemoglobin: <0.7 % (ref 0.0–1.5)
O2 Saturation: 81.2 %
Total hemoglobin: 14.5 g/dL (ref 12.0–16.0)

## 2023-10-22 MED ORDER — POTASSIUM CHLORIDE CRYS ER 20 MEQ PO TBCR
80.0000 meq | EXTENDED_RELEASE_TABLET | Freq: Three times a day (TID) | ORAL | Status: DC
  Administered 2023-10-22 (×2): 80 meq via ORAL
  Filled 2023-10-22 (×2): qty 4

## 2023-10-22 MED ORDER — TORSEMIDE 20 MG PO TABS
80.0000 mg | ORAL_TABLET | Freq: Two times a day (BID) | ORAL | Status: DC
  Administered 2023-10-22: 80 mg via ORAL
  Filled 2023-10-22: qty 4

## 2023-10-22 MED ORDER — POTASSIUM CHLORIDE 10 MEQ/100ML IV SOLN
10.0000 meq | INTRAVENOUS | Status: AC
  Administered 2023-10-22 (×6): 10 meq via INTRAVENOUS
  Filled 2023-10-22 (×6): qty 100

## 2023-10-22 NOTE — Plan of Care (Signed)
  Problem: Education: Goal: Knowledge of General Education information will improve Description Including pain rating scale, medication(s)/side effects and non-pharmacologic comfort measures Outcome: Progressing   Problem: Clinical Measurements: Goal: Ability to maintain clinical measurements within normal limits will improve Outcome: Progressing   Problem: Activity: Goal: Risk for activity intolerance will decrease Outcome: Progressing   

## 2023-10-22 NOTE — Plan of Care (Signed)
  Problem: Education: Goal: Knowledge of General Education information will improve Description: Including pain rating scale, medication(s)/side effects and non-pharmacologic comfort measures Outcome: Progressing   Problem: Health Behavior/Discharge Planning: Goal: Ability to manage health-related needs will improve Outcome: Progressing   Problem: Clinical Measurements: Goal: Ability to maintain clinical measurements within normal limits will improve Outcome: Progressing Goal: Will remain free from infection Outcome: Progressing Goal: Diagnostic test results will improve Outcome: Progressing Goal: Cardiovascular complication will be avoided Outcome: Progressing   Problem: Activity: Goal: Risk for activity intolerance will decrease Outcome: Progressing   Problem: Education: Goal: Ability to demonstrate management of disease process will improve Outcome: Progressing Goal: Ability to verbalize understanding of medication therapies will improve Outcome: Progressing Goal: Individualized Educational Video(s) Outcome: Progressing   Problem: Activity: Goal: Capacity to carry out activities will improve Outcome: Progressing   Problem: Cardiac: Goal: Ability to achieve and maintain adequate cardiopulmonary perfusion will improve Outcome: Progressing

## 2023-10-22 NOTE — Progress Notes (Signed)
Patient ID: Bradley Powell, male   DOB: 19-Oct-1981, 42 y.o.   MRN: [id number]     Advanced Heart Failure Rounding Note   Subjective:    Diuresed with Lasix drip 30 mg/hr and Diamox + metolazone yesterday.  I/Os net negative 5L, weight down ?1 lb.  CVP improved at 11.   K low at 2.9 today despite aggressive K repletion.   Objective:   Echo 11/19: EF 35-40%, LV with GHK, RV mod reduced, mildly elevated PASP, RVSP ~39, trivial MR   Weight Range:  Vital Signs:   Temp:  [97.5 F (36.4 C)-98.4 F (36.9 C)] 97.6 F (36.4 C) (12/08 0908) Pulse Rate:  [71-79] 75 (12/08 0908) Resp:  [15-20] 16 (12/08 0353) BP: (98-113)/(61-83) 106/71 (12/08 0908) SpO2:  [97 %-100 %] 97 % (12/08 0908) Weight:  [104.7 kg] 104.7 kg (12/08 0500) Last BM Date : 10/21/23  Weight change: Filed Weights   10/20/23 0455 10/21/23 0530 10/22/23 0500  Weight: 105.5 kg 105 kg 104.7 kg   Intake/Output:   Intake/Output Summary (Last 24 hours) at 10/22/2023 0927 Last data filed at 10/22/2023 0630 Gross per 24 hour  Intake 1138.63 ml  Output 6350 ml  Net -5211.37 ml    CVP 11 Physical Exam: General: NAD Neck: JVP 10 cm, no thyromegaly or thyroid nodule.  Lungs: Clear to auscultation bilaterally with normal respiratory effort. CV: Nondisplaced PMI.  Heart irregular S1/S2, no S3/S4, no murmur. 1+ ankle edema.  Abdomen: Soft, nontender, no hepatosplenomegaly, no distention.  Skin: Intact without lesions or rashes.  Neurologic: Alert and oriented x 3.  Psych: Normal affect. Extremities: No clubbing or cyanosis.  HEENT: Normal.   Telemetry: A fib/flutter 70s (Personally reviewed)    Labs: Basic Metabolic Panel: Recent Labs  Lab 10/17/23 0502 10/17/23 1417 10/18/23 0412 10/19/23 0500 10/20/23 0545 10/20/23 1230 10/21/23 0520 10/21/23 1505 10/22/23 0450  NA 135   < > 134*   < > 130* 133* 135 132* 129*  K 2.6*   < > 3.0*   < > 2.5* 4.3 2.9* 3.4* 2.9*  CL 100   < > 97*   < > 91* 93* 92* 91* 91*   CO2 24   < > 27   < > 26 25 25 28 24   GLUCOSE 131*   < > 118*   < > 160* 98 111* 112* 264*  BUN 51*   < > 51*   < > 42* 48* 46* 48* 50*  CREATININE 2.66*   < > 2.45*   < > 2.59* 2.59* 2.60* 2.80* 2.68*  CALCIUM 8.6*   < > 9.1   < > 9.0 9.6 9.3 9.3 9.1  MG 2.6*  --  2.7*  --  2.3  --   --  2.4  --    < > = values in this interval not displayed.   Liver Function Tests: No results for input(s): "AST", "ALT", "ALKPHOS", "BILITOT", "PROT", "ALBUMIN" in the last 168 hours.   No results for input(s): "LIPASE", "AMYLASE" in the last 168 hours. No results for input(s): "AMMONIA" in the last 168 hours.  CBC: Recent Labs  Lab 10/16/23 0425  WBC 9.4  HGB 11.9*  HCT 38.4*  MCV 83.8  PLT 344   Cardiac Enzymes: No results for input(s): "CKTOTAL", "CKMB", "CKMBINDEX", "TROPONINI" in the last 168 hours.  BNP: BNP (last 3 results) Recent Labs    05/23/23 1618 09/30/23 0513 10/13/23 0300  BNP 209.9* 222.1* 285.0*   ProBNP (last  3 results) No results for input(s): "PROBNP" in the last 8760 hours.  Imaging: No results found.  Medications:    Scheduled Medications:  allopurinol  200 mg Oral Daily   amiodarone  200 mg Oral Daily   apixaban  5 mg Oral BID   Chlorhexidine Gluconate Cloth  6 each Topical Daily   colchicine  0.3 mg Oral Daily   diclofenac Sodium  2 g Topical QID   empagliflozin  10 mg Oral Daily   levothyroxine  50 mcg Oral Q0600   mexiletine  150 mg Oral Q12H   pantoprazole  40 mg Oral BID   polyethylene glycol  17 g Oral Daily   potassium chloride  80 mEq Oral TID   sodium chloride flush  10-40 mL Intracatheter Q12H   spironolactone  25 mg Oral Daily   torsemide  80 mg Oral BID    Infusions:  potassium chloride 10 mEq (10/22/23 0920)    PRN Medications: acetaminophen, baclofen, ondansetron (ZOFRAN) IV, mouth rinse, oxyCODONE-acetaminophen, phenol, sodium chloride, sodium chloride flush  Assessment/Plan:   1. Acute on Chronic HFrEF due to LMNA  cardiomyopathy - Echo (1/20):  EF 35-40% with mild RV dysfunction RVSP 40 mmHG - PYP (4/22). Ratio 1.26 read as equivocal.  SPEP negative - cMRI repeated (11/22): LVEF 43%, RVEF 44%, LGE concerning for sarcoid.  - Echo (4/23): EF 35-40% Moderate RV dysfunction - Has seen geneticist, Dr. Jomarie Longs. Work up c/w LMNA.  - Admitted with NYHA IIIb symptoms and 40 pound weight gain - Echo 11/19 EF 35-40%, LV with GHK, RV mod reduced, mildly elevated PASP, RVSP ~39, trivial MR  - Limited echo 12/3 EF 35-40%, LV with GHK, mild LVH, mild MR, mod TR, RV mildly reduced. Trivial circumferential pericardial effusion present.  - Patient with LMNA cardiomyopathy.  Co-ox has been good.  Weight continues to trend down (down 71 lbs).  Creatinine holding steady 2.58 => 2.45 => 2.71 => 2.57 => 2.6 => 2.68. CVP 11 today, this may be the best we are going to get with suspected significant RV failure.  Will stop Lasix gtt and acetazolamide, start torsemide 80 mg bid (was on 80 mg daily at home).  - Replace K.  - Continue spironolactone 25 mg daily.  - Continue Jardiance 10 - Has been off b-blocker due to bradycardia and heart block - Off ARNi and ARB (hypotension and N/V in the past) - can rechallenge as able   2. Syncope/VT - 04/23 > secondary to VT - s/p ICD 4/23. - Continue mexiletine 150 mg bid   - Continue amiodarone 200 mg daiy - Followed by Dr. Ladona Ridgel  3. AKI on CKD Stage IIIb - Due to cardiorenal syndrome in setting of RV failure. Baseline SCr 1.7-2.0. Peaked 3.03 - Stable 2.68 today   4. Permanent AF/AFL - S/p AFL ablation 5/19 with Dr. Ladona Ridgel. - Continue Eliquis 5 mg bid and amiodarone 200 daily  - No role for DC-CV as he has been in AFL for a long time   5.  OSA: Continue CPAP   6. PVCs/VT - Continue mexiletine + amiodarone. - Followed by EP   7. Obesity - Body mass index is 35.1 kg/m. - Consider GLP1RA  8. Hypokalemia - K 2.9 this morning. - Aggressive repletion ordered - Continue  Spiro - Stopping IV Lasix.    9. ID - per primary. WBC peaked at 21.6 with fever. No infective source identified.  - UA (-), PCXR (-), RVP (-), Bcx (-) - Completed  IV antibiotics.   10. H/o Gout - R>L knee/leg swelling  - Uric acid 7.0   Marca Ancona 10/22/2023 9:27 AM

## 2023-10-23 DIAGNOSIS — I5043 Acute on chronic combined systolic (congestive) and diastolic (congestive) heart failure: Secondary | ICD-10-CM | POA: Diagnosis not present

## 2023-10-23 DIAGNOSIS — I5023 Acute on chronic systolic (congestive) heart failure: Secondary | ICD-10-CM | POA: Diagnosis not present

## 2023-10-23 LAB — COOXEMETRY PANEL
Carboxyhemoglobin: 1.5 % (ref 0.5–1.5)
Methemoglobin: <0.7 % (ref 0.0–1.5)
O2 Saturation: 79.8 %
Total hemoglobin: 14.5 g/dL (ref 12.0–16.0)

## 2023-10-23 LAB — BASIC METABOLIC PANEL
Anion gap: 12 (ref 5–15)
BUN: 55 mg/dL — ABNORMAL HIGH (ref 6–20)
CO2: 21 mmol/L — ABNORMAL LOW (ref 22–32)
Calcium: 9.3 mg/dL (ref 8.9–10.3)
Chloride: 95 mmol/L — ABNORMAL LOW (ref 98–111)
Creatinine, Ser: 3.52 mg/dL — ABNORMAL HIGH (ref 0.61–1.24)
GFR, Estimated: 22 mL/min — ABNORMAL LOW (ref >60)
Glucose, Bld: 187 mg/dL — ABNORMAL HIGH (ref 70–99)
Potassium: 4.1 mmol/L (ref 3.5–5.1)
Sodium: 128 mmol/L — ABNORMAL LOW (ref 135–145)

## 2023-10-23 NOTE — Plan of Care (Signed)
  Problem: Education: Goal: Knowledge of General Education information will improve Description: Including pain rating scale, medication(s)/side effects and non-pharmacologic comfort measures Outcome: Progressing   Problem: Activity: Goal: Risk for activity intolerance will decrease Outcome: Progressing   Problem: Cardiac: Goal: Ability to achieve and maintain adequate cardiopulmonary perfusion will improve Outcome: Progressing

## 2023-10-23 NOTE — Plan of Care (Signed)
  Problem: Education: Goal: Knowledge of General Education information will improve Description: Including pain rating scale, medication(s)/side effects and non-pharmacologic comfort measures Outcome: Progressing   Problem: Health Behavior/Discharge Planning: Goal: Ability to manage health-related needs will improve Outcome: Progressing   Problem: Clinical Measurements: Goal: Ability to maintain clinical measurements within normal limits will improve Outcome: Progressing Goal: Will remain free from infection Outcome: Progressing Goal: Diagnostic test results will improve Outcome: Progressing Goal: Cardiovascular complication will be avoided Outcome: Progressing   Problem: Activity: Goal: Risk for activity intolerance will decrease Outcome: Progressing   Problem: Education: Goal: Ability to demonstrate management of disease process will improve Outcome: Progressing Goal: Ability to verbalize understanding of medication therapies will improve Outcome: Progressing Goal: Individualized Educational Video(s) Outcome: Progressing   Problem: Activity: Goal: Capacity to carry out activities will improve Outcome: Progressing   Problem: Cardiac: Goal: Ability to achieve and maintain adequate cardiopulmonary perfusion will improve Outcome: Progressing

## 2023-10-23 NOTE — Progress Notes (Signed)
PROGRESS NOTE    Bradley Powell  ZDG:[id number] DOB: [birth year] DOA: 09/29/2023 PCP: [name], MD  42/M chronic combined CHF, NICM, OSA, obesity, paroxysmal A-fib, to the ED with worsening edema, shortness of breath despite compliance with medications.  In the ED he was massively volume overloaded  -Chest x-ray noted cardiomegaly pulmonary vascular congestion  -CHF team following, diuresed aggressively, down 70 LB -12/9: Worsening AKI  Subjective: -Feels okay, no events overnight, ambulated in the halls yesterday evening  Assessment and Plan:  Acute on chronic combined CHF, predominantly RV failure -Echo noted EF of 35-40%, moderately reduced RV  -Diuresed aggressively with IV Lasix, weight down over 70 LB -Heart failure team following, off Lasix, on torsemide Diamox, Aldactone, Jardiance -Creatinine higher at 3.5 today, holding torsemide and Aldactone, avoid hypotension, BMP in a.m.  Severe hypokalemia -Replace  Atrial fibrillation, chronic (HCC), history of VT Syncope with ventricular tachycardia.  Sp ICD 02/2022  -On amiodarone and mexiletine -per Cards Afib is permanent, no plan for DCCV  Acute kidney injury superimposed on chronic kidney disease (HCC) CKD stage 3b, Hyponatremia, hypokalemia.  Baseline creatinine 1.7-2 range -Peaked at 3.0, improved, plateaued around 2.5 range, now worse again, holding diuretics today  Hepatic cirrhosis (HCC) -Stable, as above  Gout -continue no evidence of allopurinol and colchicine,  Obesity, class 2 Calculated BMI is 38.3   Leukocytosis Reactive leukocytosis,.  Patient completed 5 days of antibiotic therapy with cefepime.  No clear source of infection identified  Hypothyroidism Continue with levothyroxine.   Iron deficiency Serum iron of 28, TIBC 424, transferrin saturation 7 and ferritin 129. Hemoglobin stable  DVT prophylaxis: Apixaban Code Status: Full code Family Communication: None present Disposition  Plan: Home likely 1 to 2 days  Consultants:    Procedures:   Antimicrobials:    Objective: Vitals:   10/23/23 0525 10/23/23 0614 10/23/23 0754 10/23/23 1146  BP: (!) 84/66  96/60 104/76  Pulse: 82  74 76  Resp: 18  19 18   Temp: 97.9 F (36.6 C)  97.7 F (36.5 C) 98 F (36.7 C)  TempSrc: Oral  Oral Oral  SpO2: 100%  100% 99%  Weight:  105.6 kg    Height:        Intake/Output Summary (Last 24 hours) at 10/23/2023 1155 Last data filed at 10/23/2023 1100 Gross per 24 hour  Intake 1200 ml  Output 3000 ml  Net -1800 ml   Filed Weights   10/22/23 0500 10/22/23 1117 10/23/23 0614  Weight: 104.7 kg 102.5 kg 105.6 kg    Examination:  General exam: Obese chronically ill male sitting up in bed, AAOx3 HEENT: Positive JVD CVS: S1-S2, irregular rhythm Lungs: Clear bilaterally  Abdomen: Soft, nontender, bowel sounds present Extremities: 1+ edema, Unna boots noted  Psychiatry:  Mood & affect appropriate.     Data Reviewed:   CBC: No results for input(s): "WBC", "NEUTROABS", "HGB", "HCT", "MCV", "PLT" in the last 168 hours.  Basic Metabolic Panel: Recent Labs  Lab 10/17/23 0502 10/17/23 1417 10/18/23 0412 10/19/23 0500 10/20/23 0545 10/20/23 1230 10/21/23 0520 10/21/23 1505 10/22/23 0450 10/22/23 1520 10/23/23 0519  NA 135   < > 134*   < > 130*   < > 135 132* 129* 132* 128*  K 2.6*   < > 3.0*   < > 2.5*   < > 2.9* 3.4* 2.9* 5.0 4.1  CL 100   < > 97*   < > 91*   < > 92* 91* 91* 94* 95*  CO2 24   < > 27   < > 26   < > 25 28 24 24  21*  GLUCOSE 131*   < > 118*   < > 160*   < > 111* 112* 264* 122* 187*  BUN 51*   < > 51*   < > 42*   < > 46* 48* 50* 48* 55*  CREATININE 2.66*   < > 2.45*   < > 2.59*   < > 2.60* 2.80* 2.68* 2.64* 3.52*  CALCIUM 8.6*   < > 9.1   < > 9.0   < > 9.3 9.3 9.1 9.9 9.3  MG 2.6*  --  2.7*  --  2.3  --   --  2.4  --   --   --    < > = values in this interval not displayed.   GFR: Estimated Creatinine Clearance: 32.9 mL/min (A) (by C-G  formula based on SCr of 3.52 mg/dL (H)). Liver Function Tests: No results for input(s): "AST", "ALT", "ALKPHOS", "BILITOT", "PROT", "ALBUMIN" in the last 168 hours.  No results for input(s): "LIPASE", "AMYLASE" in the last 168 hours. No results for input(s): "AMMONIA" in the last 168 hours. Coagulation Profile: No results for input(s): "INR", "PROTIME" in the last 168 hours. Cardiac Enzymes: No results for input(s): "CKTOTAL", "CKMB", "CKMBINDEX", "TROPONINI" in the last 168 hours. BNP (last 3 results) No results for input(s): "PROBNP" in the last 8760 hours. HbA1C: No results for input(s): "HGBA1C" in the last 72 hours. CBG: No results for input(s): "GLUCAP" in the last 168 hours. Lipid Profile: No results for input(s): "CHOL", "HDL", "LDLCALC", "TRIG", "CHOLHDL", "LDLDIRECT" in the last 72 hours. Thyroid Function Tests: No results for input(s): "TSH", "T4TOTAL", "FREET4", "T3FREE", "THYROIDAB" in the last 72 hours. Anemia Panel: No results for input(s): "VITAMINB12", "FOLATE", "FERRITIN", "TIBC", "IRON", "RETICCTPCT" in the last 72 hours. Urine analysis:    Component Value Date/Time   COLORURINE AMBER (A) 10/05/2023 0230   APPEARANCEUR CLEAR 10/05/2023 0230   LABSPEC 1.008 10/05/2023 0230   PHURINE 7.0 10/05/2023 0230   GLUCOSEU >=500 (A) 10/05/2023 0230   HGBUR NEGATIVE 10/05/2023 0230   BILIRUBINUR NEGATIVE 10/05/2023 0230   KETONESUR NEGATIVE 10/05/2023 0230   PROTEINUR NEGATIVE 10/05/2023 0230   NITRITE NEGATIVE 10/05/2023 0230   LEUKOCYTESUR NEGATIVE 10/05/2023 0230   Sepsis Labs: @LABRCNTIP (procalcitonin:4,lacticidven:4)  )No results found for this or any previous visit (from the past 240 hour(s)).   Radiology Studies: No results found.   Scheduled Meds:  allopurinol  200 mg Oral Daily   amiodarone  200 mg Oral Daily   apixaban  5 mg Oral BID   Chlorhexidine Gluconate Cloth  6 each Topical Daily   colchicine  0.3 mg Oral Daily   diclofenac Sodium  2 g  Topical QID   empagliflozin  10 mg Oral Daily   levothyroxine  50 mcg Oral Q0600   mexiletine  150 mg Oral Q12H   pantoprazole  40 mg Oral BID   polyethylene glycol  17 g Oral Daily   sodium chloride flush  10-40 mL Intracatheter Q12H   Continuous Infusions:     LOS: 23 days    Time spent:    Zannie Cove, MD [facility]   10/23/2023, 11:55 AM

## 2023-10-23 NOTE — Progress Notes (Signed)
CARDIAC REHAB PHASE I   Pt ambulating independently, tolerating well. HF education completed. Referral sent to [facility]. Pt is interested if he can work timing out with his work schedule. CRP1 will sign off.         [id number] Woodroe Chen, RN BSN 10/23/2023 10:21 AM

## 2023-10-23 NOTE — Progress Notes (Signed)
Orthopedic Tech Progress Note Patient Details:  Bradley Powell November 06, 1981 [id number] Applied Unna Boots per order.  Ortho Devices Type of Ortho Device: Radio broadcast assistant Ortho Device/Splint Location: BLE Ortho Device/Splint Interventions: Ordered, Application, Adjustment   Post Interventions Patient Tolerated: Well Instructions Provided: Adjustment of device, Care of device  Blase Mess 10/23/2023, 8:49 AM

## 2023-10-23 NOTE — Progress Notes (Signed)
  0215: Pt asked to take a bath. Dr is ok with that. Unna boots are off

## 2023-10-23 NOTE — Progress Notes (Addendum)
Patient ID: Bradley Powell, male   DOB: 15-Dec-1980, 42 y.o.   MRN: [id number]     Advanced Heart Failure Rounding Note   Subjective:   Yesterday switched to torsemide. Overall weight down 71 pounds.   Creatinine trending up 2.6>3.5   Frustrated about his breakfast order. Wants unna boots placed back on today.   Objective:   Echo 11/19: EF 35-40%, LV with GHK, RV mod reduced, mildly elevated PASP, RVSP ~39, trivial MR   Weight Range:  Vital Signs:   Temp:  [97.6 F (36.4 C)-98.3 F (36.8 C)] 97.9 F (36.6 C) (12/09 0525) Pulse Rate:  [67-82] 82 (12/09 0525) Resp:  [16-18] 18 (12/09 0525) BP: (84-114)/(56-73) 84/66 (12/09 0525) SpO2:  [94 %-100 %] 100 % (12/09 0525) Weight:  [102.5 kg-105.6 kg] 105.6 kg (12/09 0614) Last BM Date : 10/22/23  Weight change: Filed Weights   10/22/23 0500 10/22/23 1117 10/23/23 0614  Weight: 104.7 kg 102.5 kg 105.6 kg   Intake/Output:   Intake/Output Summary (Last 24 hours) at 10/23/2023 0738 Last data filed at 10/22/2023 1854 Gross per 24 hour  Intake 840 ml  Output 1700 ml  Net -860 ml    CVP8-9 personally checked.  Physical Exam: General:  Standing in the room.  No resp difficulty HEENT: normal Neck: supple. JVP 8-9  Carotids 2+ bilat; no bruits. No lymphadenopathy or thryomegaly appreciated. Cor: PMI nondisplaced. Irregular rate & rhythm. No rubs, gallops or murmurs. Lungs: clear Abdomen: soft, nontender, nondistended. No hepatosplenomegaly. No bruits or masses. Good bowel sounds. Extremities: no cyanosis, clubbing, rash, edema. RUE PICC. RLE trace edema. LLE no edema.  Neuro: alert & orientedx3, cranial nerves grossly intact. moves all 4 extremities w/o difficulty. Affect pleasant  Telemetry: A fib with occasional PVCs Labs: Basic Metabolic Panel: Recent Labs  Lab 10/17/23 0502 10/17/23 1417 10/18/23 0412 10/19/23 0500 10/20/23 0545 10/20/23 1230 10/21/23 0520 10/21/23 1505 10/22/23 0450 10/22/23 1520  10/23/23 0519  NA 135   < > 134*   < > 130*   < > 135 132* 129* 132* 128*  K 2.6*   < > 3.0*   < > 2.5*   < > 2.9* 3.4* 2.9* 5.0 4.1  CL 100   < > 97*   < > 91*   < > 92* 91* 91* 94* 95*  CO2 24   < > 27   < > 26   < > 25 28 24 24  21*  GLUCOSE 131*   < > 118*   < > 160*   < > 111* 112* 264* 122* 187*  BUN 51*   < > 51*   < > 42*   < > 46* 48* 50* 48* 55*  CREATININE 2.66*   < > 2.45*   < > 2.59*   < > 2.60* 2.80* 2.68* 2.64* 3.52*  CALCIUM 8.6*   < > 9.1   < > 9.0   < > 9.3 9.3 9.1 9.9 9.3  MG 2.6*  --  2.7*  --  2.3  --   --  2.4  --   --   --    < > = values in this interval not displayed.   Liver Function Tests: No results for input(s): "AST", "ALT", "ALKPHOS", "BILITOT", "PROT", "ALBUMIN" in the last 168 hours.   No results for input(s): "LIPASE", "AMYLASE" in the last 168 hours. No results for input(s): "AMMONIA" in the last 168 hours.  CBC: No results for input(s): "WBC", "NEUTROABS", "HGB", "  HCT", "MCV", "PLT" in the last 168 hours.  Cardiac Enzymes: No results for input(s): "CKTOTAL", "CKMB", "CKMBINDEX", "TROPONINI" in the last 168 hours.  BNP: BNP (last 3 results) Recent Labs    05/23/23 1618 09/30/23 0513 10/13/23 0300  BNP 209.9* 222.1* 285.0*   ProBNP (last 3 results) No results for input(s): "PROBNP" in the last 8760 hours.  Imaging: No results found.  Medications:    Scheduled Medications:  allopurinol  200 mg Oral Daily   amiodarone  200 mg Oral Daily   apixaban  5 mg Oral BID   Chlorhexidine Gluconate Cloth  6 each Topical Daily   colchicine  0.3 mg Oral Daily   diclofenac Sodium  2 g Topical QID   empagliflozin  10 mg Oral Daily   levothyroxine  50 mcg Oral Q0600   mexiletine  150 mg Oral Q12H   pantoprazole  40 mg Oral BID   polyethylene glycol  17 g Oral Daily   potassium chloride  80 mEq Oral TID   sodium chloride flush  10-40 mL Intracatheter Q12H   spironolactone  25 mg Oral Daily   torsemide  80 mg Oral BID    Infusions:    PRN  Medications: acetaminophen, baclofen, ondansetron (ZOFRAN) IV, mouth rinse, oxyCODONE-acetaminophen, phenol, sodium chloride, sodium chloride flush  Assessment/Plan:   1. 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 Ratio 1.26 read as equivocal.  SPEP negative - cMRI repeated (11/22): LVEF 43%, RVEF 44%, LGE concerning for sarcoid.  - Echo (4/23): EF 35-40% Moderate RV dysfunction - Has seen geneticist, Dr. Jomarie Longs. Work up c/w LMNA.  - Admitted with NYHA IIIb symptoms and 40 pound weight gain - Echo 11/19 EF 35-40%, LV with GHK, RV mod reduced, mildly elevated PASP, RVSP ~39, trivial MR  - Limited echo 10/17/23  EF 35-40%, LV with GHK, mild LVH, mild MR, mod TR, RV mildly reduced. Trivial circumferential pericardial effusion present.  - Patient with LMNA cardiomyopathy. CO-OX stable for days. Will stop checking.  -Overall weight down 71 pounds. Now with worsening renal function. Volume looks pretty good. CVP 8-9. -Creatinine up-->2.68>3.5. Hold torsemide and spironolactone.  - Continue Jardiance 10 - Has been off b-blocker due to bradycardia and heart block - Off ARNi and ARB (hypotension and N/V in the past) - can rechallenge as able   2. Syncope/VT - 04/23 > secondary to VT - s/p ICD 4/23. - Continue mexiletine 150 mg bid   - Continue amiodarone 200 mg daiy - Followed by Dr. Ladona Ridgel  3. AKI on CKD Stage IIIb - Due to cardiorenal syndrome in setting of RV failure. Baseline SCr 1.7-2.0.  - Trending 2.6>3.5 - Hold torsemide and spiro as noted above.  - He will need outpatient nephrology. If renal function does not improve may need inpatient referral.    4. Permanent AF/AFL - S/p AFL ablation 5/19 with Dr. Ladona Ridgel. - Continue Eliquis 5 mg bid and amiodarone 200 daily  - No role for DC-CV as he has been in AFL for a long time   5.  OSA: Continue CPAP   6. PVCs/VT - Continue mexiletine +  amiodarone. - Followed by EP   7. Obesity - Body mass index is 35.4 kg/m. - Consider GLP1RA  8. Hypokalemia - K 4.1 today. Hold potassium given torsemide and spironolactone on hold.   9. ID - per primary. WBC peaked at 21.6 with fever.  No infective source identified.  - UA (-), PCXR (-), RVP (-), Bcx (-) - Completed IV antibiotics.   10. H/o Gout - R>L knee/leg swelling  - Uric acid 7.0  Consult dietitian.    Amy Clegg NP-C  10/23/2023 7:38 AM  Patient seen and examined with the above-signed Advanced Practice Provider and/or Housestaff. I personally reviewed laboratory data, imaging studies and relevant notes. I independently examined the patient and formulated the important aspects of the plan. I have edited the note to reflect any of my changes or salient points. I have personally discussed the plan with the patient and/or family.  Denies CP or SOB. Weight down 70 pounds. CVP 8-9. Co-ox ok. Scr up 2.5 -> 3.5  General:  Lying flat in bed No resp difficulty HEENT: normal Neck: supple. JVP 8-9. Carotids 2+ bilat; no bruits. No lymphadenopathy or thryomegaly appreciated. Cor: Irregular rate & rhythm. No rubs, gallops or murmurs. Lungs: clear Abdomen: obese soft, nontender, nondistended. No hepatosplenomegaly. No bruits or masses. Good bowel sounds. Extremities: no cyanosis, clubbing, rash, edema Neuro: alert & orientedx3, cranial nerves grossly intact. moves all 4 extremities w/o difficulty. Affect pleasant  Volume status as good as we can get it. Now with AKI. Will hold diuretics. Follow Scr closely.   Hopefully home soon.   Arvilla Meres, MD  5:46 PM

## 2023-10-24 ENCOUNTER — Other Ambulatory Visit (HOSPITAL_COMMUNITY): Payer: Self-pay

## 2023-10-24 DIAGNOSIS — I4891 Unspecified atrial fibrillation: Secondary | ICD-10-CM | POA: Diagnosis not present

## 2023-10-24 DIAGNOSIS — K259 Gastric ulcer, unspecified as acute or chronic, without hemorrhage or perforation: Secondary | ICD-10-CM | POA: Diagnosis not present

## 2023-10-24 DIAGNOSIS — I5023 Acute on chronic systolic (congestive) heart failure: Secondary | ICD-10-CM | POA: Diagnosis not present

## 2023-10-24 DIAGNOSIS — I272 Pulmonary hypertension, unspecified: Secondary | ICD-10-CM | POA: Diagnosis not present

## 2023-10-24 DIAGNOSIS — Z0000 Encounter for general adult medical examination without abnormal findings: Secondary | ICD-10-CM | POA: Diagnosis not present

## 2023-10-24 DIAGNOSIS — N179 Acute kidney failure, unspecified: Secondary | ICD-10-CM | POA: Diagnosis not present

## 2023-10-24 DIAGNOSIS — D6869 Other thrombophilia: Secondary | ICD-10-CM | POA: Diagnosis not present

## 2023-10-24 DIAGNOSIS — K7460 Unspecified cirrhosis of liver: Secondary | ICD-10-CM | POA: Diagnosis not present

## 2023-10-24 LAB — BASIC METABOLIC PANEL
Anion gap: 10 (ref 5–15)
BUN: 54 mg/dL — ABNORMAL HIGH (ref 6–20)
CO2: 22 mmol/L (ref 22–32)
Calcium: 9.5 mg/dL (ref 8.9–10.3)
Chloride: 96 mmol/L — ABNORMAL LOW (ref 98–111)
Creatinine, Ser: 3.04 mg/dL — ABNORMAL HIGH (ref 0.61–1.24)
GFR, Estimated: 26 mL/min — ABNORMAL LOW (ref >60)
Glucose, Bld: 110 mg/dL — ABNORMAL HIGH (ref 70–99)
Potassium: 3.7 mmol/L (ref 3.5–5.1)
Sodium: 128 mmol/L — ABNORMAL LOW (ref 135–145)

## 2023-10-24 MED ORDER — TORSEMIDE 20 MG PO TABS
80.0000 mg | ORAL_TABLET | Freq: Two times a day (BID) | ORAL | Status: DC
  Administered 2023-10-24: 80 mg via ORAL
  Filled 2023-10-24: qty 4

## 2023-10-24 MED ORDER — TORSEMIDE 20 MG PO TABS: 80.0000 mg | ORAL_TABLET | Freq: Every day | ORAL | Status: DC

## 2023-10-24 MED ORDER — POTASSIUM CHLORIDE CRYS ER 20 MEQ PO TBCR
40.0000 meq | EXTENDED_RELEASE_TABLET | Freq: Two times a day (BID) | ORAL | Status: DC
  Administered 2023-10-24: 40 meq via ORAL
  Filled 2023-10-24: qty 2

## 2023-10-24 MED ORDER — TORSEMIDE 20 MG PO TABS
40.0000 mg | ORAL_TABLET | Freq: Two times a day (BID) | ORAL | 2 refills | Status: DC
  Filled 2023-10-24: qty 180, 30d supply, fill #0

## 2023-10-24 MED ORDER — ALLOPURINOL 100 MG PO TABS: 100.0000 mg | ORAL_TABLET | Freq: Every day | ORAL | Status: DC

## 2023-10-24 MED ORDER — TORSEMIDE 20 MG PO TABS: 40.0000 mg | ORAL_TABLET | Freq: Every day | ORAL | Status: DC

## 2023-10-24 MED ORDER — POLYETHYLENE GLYCOL 3350 17 GM/SCOOP PO POWD
17.0000 g | Freq: Every day | ORAL | 1 refills | Status: DC | PRN
  Filled 2023-10-24: qty 238, 14d supply, fill #0

## 2023-10-24 MED ORDER — UNABLE TO FIND: 0 refills | Status: DC

## 2023-10-24 MED ORDER — POTASSIUM CHLORIDE CRYS ER 20 MEQ PO TBCR
20.0000 meq | EXTENDED_RELEASE_TABLET | Freq: Two times a day (BID) | ORAL | 1 refills | Status: DC
  Filled 2023-10-24: qty 90, 23d supply, fill #0

## 2023-10-24 NOTE — Progress Notes (Addendum)
Patient ID: Bradley Powell, male   DOB: 01-Feb-1981, 42 y.o.   MRN: [id number]     Advanced Heart Failure Rounding Note   Subjective:   Yesterday diuretics held due to AKI.   CVP 15-16 today.   Creatinine trending up 2.6>3.5> 3  Feels ok. Denies SOB.   Objective:   Echo 11/19: EF 35-40%, LV with GHK, RV mod reduced, mildly elevated PASP, RVSP ~39, trivial MR   Weight Range:  Vital Signs:   Temp:  [97.6 F (36.4 C)-98.3 F (36.8 C)] 97.9 F (36.6 C) (12/10 0426) Pulse Rate:  [68-89] 89 (12/10 0426) Resp:  [16-19] 18 (12/10 0426) BP: (95-120)/(60-76) 96/69 (12/10 0426) SpO2:  [98 %-100 %] 100 % (12/10 0426) Weight:  [107.9 kg] 107.9 kg (12/10 0426) Last BM Date : 10/23/23  Weight change: Filed Weights   10/22/23 1117 10/23/23 0614 10/24/23 0426  Weight: 102.5 kg 105.6 kg 107.9 kg   Intake/Output:   Intake/Output Summary (Last 24 hours) at 10/24/2023 0708 Last data filed at 10/23/2023 2226 Gross per 24 hour  Intake 610 ml  Output 2000 ml  Net -1390 ml    CVP 15-16 Physical Exam: General:  Well appearing. No resp difficulty HEENT: normal Neck: supple. JVP to jaw . Carotids 2+ bilat; no bruits. No lymphadenopathy or thryomegaly appreciated. Cor: PMI nondisplaced. Irregular rate & rhythm. No rubs, gallops or murmurs. Lungs: clear Abdomen: soft, nontender, nondistended. No hepatosplenomegaly. No bruits or masses. Good bowel sounds. Extremities: no cyanosis, clubbing, rash, R and LLE unna boots.  Neuro: alert & orientedx3, cranial nerves grossly intact. moves all 4 extremities w/o difficulty. Affect pleasant  Telemetry:  Afib with PVCs  Labs: Basic Metabolic Panel: Recent Labs  Lab 10/18/23 0412 10/19/23 0500 10/20/23 0545 10/20/23 1230 10/21/23 1505 10/22/23 0450 10/22/23 1520 10/23/23 0519 10/24/23 0440  NA 134*   < > 130*   < > 132* 129* 132* 128* 128*  K 3.0*   < > 2.5*   < > 3.4* 2.9* 5.0 4.1 3.7  CL 97*   < > 91*   < > 91* 91* 94* 95* 96*   CO2 27   < > 26   < > 28 24 24  21* 22  GLUCOSE 118*   < > 160*   < > 112* 264* 122* 187* 110*  BUN 51*   < > 42*   < > 48* 50* 48* 55* 54*  CREATININE 2.45*   < > 2.59*   < > 2.80* 2.68* 2.64* 3.52* 3.04*  CALCIUM 9.1   < > 9.0   < > 9.3 9.1 9.9 9.3 9.5  MG 2.7*  --  2.3  --  2.4  --   --   --   --    < > = values in this interval not displayed.   Liver Function Tests: No results for input(s): "AST", "ALT", "ALKPHOS", "BILITOT", "PROT", "ALBUMIN" in the last 168 hours.   No results for input(s): "LIPASE", "AMYLASE" in the last 168 hours. No results for input(s): "AMMONIA" in the last 168 hours.  CBC: No results for input(s): "WBC", "NEUTROABS", "HGB", "HCT", "MCV", "PLT" in the last 168 hours.  Cardiac Enzymes: No results for input(s): "CKTOTAL", "CKMB", "CKMBINDEX", "TROPONINI" in the last 168 hours.  BNP: BNP (last 3 results) Recent Labs    05/23/23 1618 09/30/23 0513 10/13/23 0300  BNP 209.9* 222.1* 285.0*   ProBNP (last 3 results) No results for input(s): "PROBNP" in the last 8760 hours.  Imaging: No results found.  Medications:    Scheduled Medications:  allopurinol  200 mg Oral Daily   amiodarone  200 mg Oral Daily   apixaban  5 mg Oral BID   Chlorhexidine Gluconate Cloth  6 each Topical Daily   colchicine  0.3 mg Oral Daily   diclofenac Sodium  2 g Topical QID   empagliflozin  10 mg Oral Daily   levothyroxine  50 mcg Oral Q0600   mexiletine  150 mg Oral Q12H   pantoprazole  40 mg Oral BID   polyethylene glycol  17 g Oral Daily   sodium chloride flush  10-40 mL Intracatheter Q12H    Infusions:    PRN Medications: acetaminophen, baclofen, ondansetron (ZOFRAN) IV, mouth rinse, oxyCODONE-acetaminophen, phenol, sodium chloride, sodium chloride flush  Assessment/Plan:   1. 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 Ratio 1.26 read as equivocal.  SPEP negative - cMRI  repeated (11/22): LVEF 43%, RVEF 44%, LGE concerning for sarcoid.  - Echo (4/23): EF 35-40% Moderate RV dysfunction - Has seen geneticist, Dr. Jomarie Longs. Work up c/w LMNA.  - Admitted with NYHA IIIb symptoms and 40 pound weight gain - Echo 11/19 EF 35-40%, LV with GHK, RV mod reduced, mildly elevated PASP, RVSP ~39, trivial MR  - Limited echo 10/17/23  EF 35-40%, LV with GHK, mild LVH, mild MR, mod TR, RV mildly reduced. Trivial circumferential pericardial effusion present.  - Patient with LMNA cardiomyopathy.  -Overall weight down 65 pounds. CVP 15-16  -Creatinine up-->2.68>3.5>3.05  -. Start torsemide 80am 40pmKDUR 40 twice a day. Can increase to 80 bid as needed -Hold spironolactone.  - Continue Jardiance 10 - Has been off b-blocker due to bradycardia and heart block - Off ARNi and ARB (hypotension and N/V in the past) - can rechallenge as able   2. Syncope/VT - 04/23 > secondary to VT - s/p ICD 4/23. - Continue mexiletine 150 mg bid   - Continue amiodarone 200 mg daiy - Followed by Dr. Ladona Ridgel  3. AKI on CKD Stage IIIb - Due to cardiorenal syndrome in setting of RV failure. Baseline SCr 1.7-2.0.  - Trending back down 2.6>3.5>3.05 - Hold torsemide and spiro as noted above. Restart torsemide tomorrow.  - He will need outpatient nephrology.    4. Permanent AF/AFL - S/p AFL ablation 5/19 with Dr. Ladona Ridgel. - Continue Eliquis 5 mg bid and amiodarone 200 daily  - No role for DC-CV as he has been in AFL for a long time   5.  OSA: Continue CPAP   6. PVCs/VT - Continue mexiletine + amiodarone. - Followed by EP   7. Obesity - Body mass index is 36.17 kg/m. - Consider GLP1RA  8. Hypokalemia -Resolved.   9. ID - per primary. WBC peaked at 21.6 with fever. No infective source identified.  - UA (-), PCXR (-), RVP (-), Bcx (-) - Completed IV antibiotics.   10. H/o Gout - R>L knee/leg swelling  - Uric acid 7.0   Amy Clegg NP-C  10/24/2023 7:08 AM  Patient seen and examined  with the above-signed Advanced Practice Provider and/or Housestaff. I personally reviewed laboratory data, imaging studies and relevant notes. I independently examined the patient and formulated the important aspects of the plan. I have edited the note to reflect any of my changes or salient points. I have personally discussed the plan with the patient and/or family.  Feels  ok. Denis CP or SOB.  Scr improving. CVP ~ 15  General: Standing in room. No resp difficulty HEENT: normal Neck: supple. JVP to jaw Carotids 2+ bilat; no bruits. No lymphadenopathy or thryomegaly appreciated. Cor: PMI nondisplaced. Regular rate & rhythm. No rubs, gallops or murmurs. Lungs: clear Abdomen: obese soft, nontender, nondistended. No hepatosplenomegaly. No bruits or masses. Good bowel sounds. Extremities: no cyanosis, clubbing, rash, tr edema Neuro: alert & orientedx3, cranial nerves grossly intact. moves all 4 extremities w/o difficulty. Affect pleasant   I think this is as good as we can get home in the setting of severe RV failure.  D/c home on torsemide 80/40. Can increase to 80/80 as needed +/- metolazone.  Discussed fluid restriction with him.   Will see back in HF Clinic next week.   Arvilla Meres, MD  10:21 AM

## 2023-10-24 NOTE — TOC Initial Note (Signed)
Transition of Care [facility]) - Initial/Assessment Note    Patient Details  Name: Bradley Powell MRN: [id number] Date of Birth: [birth year]  Transition of Care [facility]) CM/SW Contact:    Bradley Cousin, RN Phone Number:[phone] 10/24/2023, 2:43 PM  Clinical Narrative:                 CM spoke to pt at bedside. States he will have family provide transportation home. He has appt with PCP today a 3 pm and plans to keep appt. He will reschedule if he is not dc in time for appt. Has scale at home for daily weight.   Expected Discharge Plan: Home/Self Care Barriers to Discharge: No Barriers Identified   Patient Goals and CMS Choice Patient states their goals for this hospitalization and ongoing recovery are:: wants to recover          Expected Discharge Plan and Services   Discharge Planning Services: CM Consult   Living arrangements for the past 2 months: Single Family Home Expected Discharge Date: 10/24/23                                    Prior Living Arrangements/Services Living arrangements for the past 2 months: Single Family Home Lives with:: Parents Patient language and need for interpreter reviewed:: Yes Do you feel safe going back to the place where you live?: Yes      Need for Family Participation in Patient Care: No (Comment) Care giver support system in place?: No (comment) Current home services: DME (scale) Criminal Activity/Legal Involvement Pertinent to Current Situation/Hospitalization: No - Comment as needed  Activities of Daily Living   ADL Screening (condition at time of admission) Independently performs ADLs?: Yes (appropriate for developmental age) Is the patient deaf or have difficulty hearing?: No Does the patient have difficulty seeing, even when wearing glasses/contacts?: No Does the patient have difficulty concentrating, remembering, or making decisions?: No  Permission Sought/Granted Permission sought to share information with  : Case Manager, Family Supports Permission granted to share information with : Yes, Release of Information Signed  Share Information with NAME: Bradley Powell     Permission granted to share info w Relationship: father  Permission granted to share info w Contact Information: [phone]  Emotional Assessment Appearance:: Appears stated age Attitude/Demeanor/Rapport: Gracious Affect (typically observed): Accepting Orientation: : Oriented to Self, Oriented to Place, Oriented to  Time, Oriented to Situation Alcohol / Substance Use: Not Applicable Psych Involvement: No (comment)  Admission diagnosis:  Acute on chronic combined systolic and diastolic congestive heart failure (HCC) [I50.43] Acute on chronic congestive heart failure (HCC) [I50.9] Hypervolemia, unspecified hypervolemia type [E87.70] Patient Active Problem List   Diagnosis Date Noted   Acute kidney injury superimposed on chronic kidney disease (HCC) 10/13/2023   Leukocytosis 10/13/2023   Hypothyroidism 10/13/2023   Iron deficiency 10/13/2023   Acute on chronic congestive heart failure (HCC) 09/30/2023   Fluid overload 09/29/2023   Ventricular tachycardia (HCC) 05/26/2022   ICD (implantable cardioverter-defibrillator) in place 05/26/2022   Syncope and collapse 02/15/2022   Acute on chronic systolic (congestive) heart failure (HCC) 02/15/2022   Hepatic cirrhosis (HCC) 06/16/2020   Dehydration with hyponatremia 06/16/2020   Hypotension due to hypovolemia 06/16/2020   Intractable nausea and vomiting 06/16/2020   Acute kidney injury (HCC) 06/15/2020   Atrial fibrillation, chronic (HCC) 04/29/2020   Chronic combined systolic and diastolic congestive heart failure (HCC) 04/07/2018  NICM (nonischemic cardiomyopathy) (HCC) 04/07/2018   Hyperglycemia 04/07/2018   Typical atrial flutter (HCC) 04/06/2018   Paroxysmal atrial flutter (HCC) 04/06/2018   Gout 03/30/2017   Suspected sleep apnea 03/01/2017   Edema 02/24/2017    Testicular swelling 02/24/2017   CHF (congestive heart failure) (HCC) 02/24/2017   Overweight 02/24/2017   Costochondritis 02/24/2017   Sinus tachycardia 02/24/2017   First degree AV block 02/24/2017   Scrotal edema 02/24/2017   Obesity, class 2 02/24/2017   PCP:  [name], MD Pharmacy:   [facility] - [place], [place] - [phone], [address] [place] [address] Phone: [phone] Fax: [phone]  [id number] - [address], [place] - [address] [place] [address] Phone: [phone] Fax: [phone]  [facility] [phone] - [place], [place] - [address] [place] [phone] Phone: [phone] Fax: [phone]  [facility] [address] [place] [address] Phone: [phone] Fax: [phone]  [facility] - [place], [place] - [address] [place] [address] Phone: [phone] Fax: [phone]     Social Determinants of Health (SDOH) Social History: SDOH Screenings   Food Insecurity: No Food Insecurity (09/30/2023)  Housing: Low Risk  (09/30/2023)  Transportation Needs: No Transportation Needs (09/30/2023)  Utilities: Not At Risk (09/30/2023)  Tobacco Use: Low Risk  (09/29/2023)   SDOH Interventions:     Readmission Risk Interventions     No data to display

## 2023-10-24 NOTE — Progress Notes (Signed)
Discharge instructions reviewed with patient.  He voiced understanding.  Pt continued to lay in bed post his PICC line removal .  He stated that his ride will be here later, but requested time for him to have lunch.

## 2023-10-24 NOTE — TOC Transition Note (Signed)
Transition of Care [facility]) - CM/SW Discharge Note   Patient Details  Name: Bradley Powell MRN: [id number] Date of Birth: 04-06-1981  Transition of Care [facility]) CM/SW Contact:  Nicanor Bake Phone Number: [phone] 10/24/2023, 11:28 AM   Clinical Narrative:  HF CSW called and spoke the pt over the phone. Pt stated that he will be transported home by family and will schedule his on hospital PCP appointment.       Barriers to Discharge: Continued Medical Work up   Patient Goals and CMS Choice      Discharge Placement                         Discharge Plan and Services Additional resources added to the After Visit Summary for                                       Social Determinants of Health (SDOH) Interventions SDOH Screenings   Food Insecurity: No Food Insecurity (09/30/2023)  Housing: Low Risk  (09/30/2023)  Transportation Needs: No Transportation Needs (09/30/2023)  Utilities: Not At Risk (09/30/2023)  Tobacco Use: Low Risk  (09/29/2023)     Readmission Risk Interventions     No data to display

## 2023-10-24 NOTE — Discharge Summary (Signed)
Physician Discharge Summary  KASHIS VANA WVP:[id number] DOB: December 31, 1980 DOA: 09/29/2023  PCP: [name], MD  Admit date: 09/29/2023 Discharge date: 10/24/2023  Time spent: 45 minutes  Recommendations for Outpatient Follow-up:  Referral sent to [facility] Advanced heart failure clinic in 1 to 2 weeks   Discharge Diagnoses:  Active Problems:   Acute on chronic systolic (congestive) heart failure (HCC)   Atrial fibrillation, chronic (HCC)   Acute kidney injury superimposed on chronic kidney disease (HCC)   Hepatic cirrhosis (HCC)   Gout   Obesity, class 2   Leukocytosis   Hypothyroidism   Iron deficiency   Discharge Condition: Improved  Diet recommendation: Low-salt, heart healthy,  Filed Weights   10/22/23 1117 10/23/23 0614 10/24/23 0426  Weight: 102.5 kg 105.6 kg 107.9 kg    History of present illness:  42/M chronic combined CHF, NICM, OSA, obesity, paroxysmal A-fib, to the ED with worsening edema, shortness of breath despite compliance with medications.  In the ED he was massively volume overloaded  -Chest x-ray noted cardiomegaly pulmonary vascular congestion  -CHF team following, diuresed aggressively, down 70 LB  Hospital Course:  Acute on chronic combined CHF, predominantly RV failure -Echo noted EF of 35-40%, moderately reduced RV  -Diuresed aggressively with IV Lasix, weight down over 70 LB -Heart failure team following,  -Creatinine trended up to 3.5 yesterday, held torsemide and Aldactone, now improving, down to 3.0, per CHF team restart torsemide 80/40 mg daily -Referral sent to nephrology -Follow-up with heart failure clinic in 1 to 2 weeks   Severe hypokalemia -Replaced   Atrial fibrillation, chronic (HCC), history of VT Syncope with ventricular tachycardia.  Sp ICD 02/2022  -On amiodarone, Eliquis -per Cards Afib is permanent, no plan for DCCV   Acute kidney injury on CKD stage 3b, Hyponatremia, hypokalemia.   Baseline creatinine 1.7-2 range -Creatinine had been in 2.5 range, trended up to 3.5 yesterday, now improving at 3.0 -Torsemide resumed, Aldactone discontinued -Referral sent to [place] [facility]   Hepatic cirrhosis (HCC) -Diuretics as above   Gout -continue no evidence of allopurinol and colchicine,   Obesity, class 2 Calculated BMI is 38.3    Leukocytosis Reactive leukocytosis,.  Patient completed 5 days of antibiotic therapy with cefepime.  No clear source of infection identified   Hypothyroidism Continue with levothyroxine.    Iron deficiency Serum iron of 28, TIBC 424, transferrin saturation 7 and ferritin 129. Hemoglobin stable  Discharge Exam: Vitals:   10/24/23 0426 10/24/23 0807  BP: 96/69 90/63  Pulse: 89 81  Resp: 18 17  Temp: 97.9 F (36.6 C) 98.5 F (36.9 C)  SpO2: 100% 100%   General exam: Obese chronically ill male sitting up in bed, AAOx3 HEENT: Positive JVD CVS: S1-S2, irregular rhythm Lungs: Clear bilaterally  Abdomen: Soft, nontender, bowel sounds present Extremities: Trace edema Psychiatry:  Mood & affect appropriate.   Discharge Instructions   Discharge Instructions     Amb Referral to Cardiac Rehabilitation   Complete by: As directed    Diagnosis: Heart Failure (see criteria below if ordering Phase II)   Heart Failure Type: Chronic Systolic & Diastolic   After initial evaluation and assessments completed: Virtual Based Care may be provided alone or in conjunction with Phase 2 Cardiac Rehab based on patient barriers.: Yes   Intensive Cardiac Rehabilitation (ICR) [facility] location only OR Traditional Cardiac Rehabilitation (TCR) *If criteria for ICR are not met will enroll in TCR [facility] only): Yes   Ambulatory referral to Nephrology  Complete by: As directed    Please send referral to Nephrology at [facility], for CKD4 with diastolic CHF   Diet - low sodium heart healthy   Complete by: As directed    Increase  activity slowly   Complete by: As directed       Allergies as of 10/24/2023       Reactions   Entresto [sacubitril-valsartan] Nausea And Vomiting, Other (See Comments)   Lightheaded, fainting        Medication List     STOP taking these medications    metolazone 5 MG tablet Commonly known as: ZAROXOLYN   spironolactone 25 MG tablet Commonly known as: ALDACTONE   sucralfate 1 g tablet Commonly known as: CARAFATE       TAKE these medications    acetaminophen 500 MG tablet Commonly known as: TYLENOL Take 1,000 mg by mouth as needed for mild pain (pain score 1-3) (Leg pain).   allopurinol 100 MG tablet Commonly known as: ZYLOPRIM Take 1 tablet (100 mg total) by mouth daily. Please contact PCP for further refills What changed: how much to take   amiodarone 200 MG tablet Commonly known as: PACERONE Take 1 tablet (200 mg total) by mouth daily.   Eliquis 5 MG Tabs tablet Generic drug: apixaban TAKE 1 TABLET BY MOUTH TWICE A DAY   esomeprazole 40 MG capsule Commonly known as: NEXIUM Take 40 mg by mouth 2 (two) times daily.   Jardiance 10 MG Tabs tablet Generic drug: empagliflozin TAKE 1 TABLET BY MOUTH EVERY DAY BEFORE BREAKFAST   levothyroxine 50 MCG tablet Commonly known as: SYNTHROID TAKE 1 TABLET (50 MCG TOTAL) BY MOUTH DAILY BEFORE BREAKFAST. TAKE 1 HOUR BEFORE BREAKFAST   mexiletine 150 MG capsule Commonly known as: MEXITIL Take 1 capsule (150 mg total) by mouth every 12 (twelve) hours.   Mitigare 0.6 MG Caps Generic drug: Colchicine Take 0.6 mg by mouth daily as needed (gout flare).   polyethylene glycol powder 17 GM/SCOOP powder Commonly known as: GLYCOLAX/MIRALAX Take 1 CAPFUL (17 g) by mouth daily as needed.   potassium chloride SA 20 MEQ tablet Commonly known as: KLOR-CON M Take 1-2 tablets (20-40 mEq total) by mouth 2 (two) times daily. PLEASE TAKE 40 mEq (2 tabs in the morning)  and 20 mEq (1 tab in the afternoon/evening) What changed:   how much to take how to take this when to take this additional instructions   sodium chloride 0.65 % nasal spray Commonly known as: OCEAN Place 2 sprays into the nose as needed for congestion.   torsemide 20 MG tablet Commonly known as: DEMADEX Take 4 tablets (80 mg) in the morning and 2 tablets (40 mg)in the afternoon What changed: how much to take   UNABLE TO FIND Referral to [facility] -Please accept this patient as a referral for CKD 4 and diastolic CHF  Zannie Cove MD [facility]       Allergies  Allergen Reactions   Sherryll Burger [Sacubitril-Valsartan] Nausea And Vomiting and Other (See Comments)    Lightheaded, fainting    Follow-up Information     [facility] Follow up on 11/02/2023.   Specialty: Cardiology Why: at 9:40 with Dr Kriste Basque information: [address] [place] [address] [phone]        [name], MD Follow up on 10/31/2023.   Specialty: Internal Medicine Why: @2 :00pm Contact information: [address] [place] [address]  [phone]                  The results of significant diagnostics from this hospitalization (including imaging, microbiology, ancillary and laboratory) are listed below for reference.    Significant Diagnostic Studies: ECHOCARDIOGRAM LIMITED  Result Date: 10/16/2023    ECHOCARDIOGRAM LIMITED REPORT   Patient Name:   ESPERANZA GOOSMAN [facility] Date of Exam: 10/16/2023 Medical Rec #:  [id number]          Height:       68.0 in Accession #:    [id number]         Weight:       249.2 lb Date of Birth:  Sep 17, 1981         BSA:          2.244 m Patient Age:    42 years           BP:           101/71 mmHg Patient Gender: M                  HR:           75 bpm. Exam Location:  Inpatient Procedure: Limited Echo and Limited Color Doppler Indications:    CHF- Acute Systolic I50.21  History:        Patient has  prior history of Echocardiogram examinations, most                 recent 10/03/2023. Cardiomyopathy and CHF, Defibrillator,                 Arrythmias:Atrial Fibrillation, Atrial Flutter, PVC and                 Tachycardia, Signs/Symptoms:Hypotension and Syncope; Risk                 Factors:Sleep Apnea.  Sonographer:    Lucendia Herrlich RCS Referring Phys: [phone] ALMA L DIAZ IMPRESSIONS  1. Limited study for LV function  2. Left ventricular ejection fraction, by estimation, is 35 to 40%. Left ventricular ejection fraction by 2D MOD biplane is 37.9 %. The left ventricle has moderately decreased function. The left ventricle demonstrates global hypokinesis. There is mild left ventricular hypertrophy.  3. The mitral valve is abnormal. Mild mitral valve regurgitation.  4. Tricuspid valve regurgitation is moderate.  5. Right ventricular systolic function is mildly reduced. The right ventricular size is normal. There is normal pulmonary artery systolic pressure. The estimated right ventricular systolic pressure is 35.2 mmHg.  6. The inferior vena cava is dilated in size with <50% respiratory variability, suggesting right atrial pressure of 15 mmHg.  7. Rhythm strip during this exam demonstrates atrial flutter. Comparison(s): No significant change from prior study. 10/03/2023: LVEF 35-40%. FINDINGS  Left Ventricle: Left ventricular ejection fraction, by estimation, is 35 to 40%. Left ventricular ejection fraction by 2D MOD biplane is 37.9 %. The left ventricle has moderately decreased function. The left ventricle demonstrates global hypokinesis. The left ventricular internal cavity size was normal in size. There is mild left ventricular hypertrophy. Right Ventricle: The right ventricular size is normal. No increase in right ventricular wall thickness. Right ventricular systolic function is mildly reduced. There is normal pulmonary artery systolic pressure. The tricuspid regurgitant velocity is 2.25 m/s, and with an  assumed right atrial pressure of 15 mmHg, the estimated right ventricular systolic pressure is 35.2 mmHg. Pericardium: Trivial pericardial effusion is present. The pericardial effusion is circumferential. Mitral Valve: The  mitral valve is abnormal. There is moderate thickening of the anterior and posterior mitral valve leaflet(s). Mild mitral valve regurgitation, with posteriorly-directed jet. Tricuspid Valve: The tricuspid valve is grossly normal. Tricuspid valve regurgitation is moderate. Pulmonic Valve: The pulmonic valve was grossly normal. Pulmonic valve regurgitation is trivial. Aorta: The aortic root and ascending aorta are structurally normal, with no evidence of dilitation. Venous: The inferior vena cava is dilated in size with less than 50% respiratory variability, suggesting right atrial pressure of 15 mmHg. EKG: Rhythm strip during this exam demonstrates atrial flutter. Additional Comments: A device lead is visualized.  LEFT VENTRICLE PLAX 2D                        Biplane EF (MOD) LVIDd:         5.10 cm         LV Biplane EF:   Left LVIDs:         4.20 cm                          ventricular LV PW:         1.20 cm                          ejection LV IVS:        0.90 cm                          fraction by LVOT diam:     2.00 cm                          2D MOD LVOT Area:     3.14 cm                         biplane is                                                 37.9 %.  LV Volumes (MOD)               Diastology LV vol d, MOD    110.0 ml      LV e' medial:    8.81 cm/s A2C:                           LV E/e' medial:  10.7 LV vol d, MOD    110.0 ml      LV e' lateral:   8.92 cm/s A4C:                           LV E/e' lateral: 10.6 LV vol s, MOD    62.4 ml A2C: LV vol s, MOD    68.7 ml A4C: LV SV MOD A2C:   47.6 ml LV SV MOD A4C:   110.0 ml LV SV MOD BP:    41.7 ml RIGHT VENTRICLE            IVC RV S prime:     8.86 cm/s  IVC diam: 2.80 cm TAPSE (M-mode): 1.3 cm LEFT ATRIUM  Index LA diam:     5.10 cm 2.27 cm/m   AORTA Ao Root diam: 2.60 cm Ao Asc diam:  2.20 cm MITRAL VALVE               TRICUSPID VALVE MV Area (PHT): 6.71 cm    TR Peak grad:   20.2 mmHg MV Decel Time: 113 msec    TR Vmax:        225.00 cm/s MR Peak grad: 63.7 mmHg MR Vmax:      399.00 cm/s  SHUNTS MV E velocity: 94.70 cm/s  Systemic Diam: 2.00 cm MV A velocity: 53.40 cm/s MV E/A ratio:  1.77 Zoila Shutter MD Electronically signed by Zoila Shutter MD Signature Date/Time: 10/16/2023/5:16:29 PM    Final    DG CHEST PORT 1 VIEW  Result Date: 10/09/2023 CLINICAL DATA:  PICC line EXAM: PORTABLE CHEST 1 VIEW COMPARISON:  10/06/2023 FINDINGS: Left-sided pacing device. Right upper extremity central venous catheter tip over the SVC. Borderline to mild cardiomegaly. No acute airspace disease, pleural effusion or pneumothorax IMPRESSION: Right upper extremity central venous catheter tip over the SVC. Electronically Signed   By: Jasmine Pang M.D.   On: 10/09/2023 20:37   VAS [place] LOWER EXTREMITY VENOUS (DVT)  Result Date: 10/07/2023  Lower Venous DVT Study Patient Name:  DHANI FAHRER [facility]  Date of Exam:   10/06/2023 Medical Rec #: [id number]           Accession #:    [id number] Date of Birth: 12-02-1980          Patient Gender: M Patient Age:   71 years Exam Location:  [facility] Procedure:      VAS [place] LOWER EXTREMITY VENOUS (DVT) Referring Phys: [place] LEE --------------------------------------------------------------------------------  Indications: Swelling, and Edema. Other Indications: Atrial flutter. Limitations: Bandages. Comparison Study: No prior exam. Performing Technologist: Fernande Bras  Examination Guidelines: A complete evaluation includes B-mode imaging, spectral Doppler, color Doppler, and power Doppler as needed of all accessible portions of each vessel. Bilateral testing is considered an integral part of a complete examination. Limited examinations for reoccurring indications may be performed as noted. The  reflux portion of the exam is performed with the patient in reverse Trendelenburg.  +---------+---------------+---------+-----------+----------+-------------------+ RIGHT    CompressibilityPhasicitySpontaneityPropertiesThrombus Aging      +---------+---------------+---------+-----------+----------+-------------------+ CFV      Full           Yes      Yes                                      +---------+---------------+---------+-----------+----------+-------------------+ SFJ      Full                                                             +---------+---------------+---------+-----------+----------+-------------------+ FV Prox  Full                                                             +---------+---------------+---------+-----------+----------+-------------------+ FV Mid   Full                                                             +---------+---------------+---------+-----------+----------+-------------------+  FV DistalFull                                                             +---------+---------------+---------+-----------+----------+-------------------+ PFV      Full                                                             +---------+---------------+---------+-----------+----------+-------------------+ POP      Full           Yes      Yes                                      +---------+---------------+---------+-----------+----------+-------------------+ PTV                                                   Not well visualized +---------+---------------+---------+-----------+----------+-------------------+ PERO                                                  Not well visualized +---------+---------------+---------+-----------+----------+-------------------+ PTVs and Peros not visualized due to bandages.  +----+---------------+---------+-----------+----------+--------------+  LEFTCompressibilityPhasicitySpontaneityPropertiesThrombus Aging +----+---------------+---------+-----------+----------+--------------+ CFV Full           Yes      Yes                                 +----+---------------+---------+-----------+----------+--------------+     Summary: RIGHT: - There is no evidence of deep vein thrombosis in the lower extremity.  - No cystic structure found in the popliteal fossa.  LEFT: - No evidence of common femoral vein obstruction.   *See table(s) above for measurements and observations. Electronically signed by Gerarda Fraction on 10/07/2023 at 11:40:08 AM.    Final    DG CHEST PORT 1 VIEW  Result Date: 10/06/2023 CLINICAL DATA:  Central line placement EXAM: PORTABLE CHEST 1 VIEW COMPARISON:  Chest x-ray 10/04/2023 FINDINGS: Left-sided pacemaker is present. The heart is enlarged. Right upper extremity PICC terminates in the SVC. There is no pneumothorax. The lungs are clear. There is no pleural effusion. No acute osseous abnormalities. IMPRESSION: 1. Right upper extremity PICC terminates in the SVC. No pneumothorax. 2. Cardiomegaly. Electronically Signed   By: Darliss Cheney M.D.   On: 10/06/2023 19:23   [place] EKG SITE RITE  Result Date: 10/06/2023 If Site Rite image not attached, placement could not be confirmed due to current cardiac rhythm.  DG CHEST PORT 1 VIEW  Result Date: 10/05/2023 CLINICAL DATA:  Fevers EXAM: PORTABLE CHEST 1 VIEW COMPARISON:  09/29/2023 FINDINGS: Cardiac shadows within normal limits. Defibrillator is noted and stable. Lungs are well aerated bilaterally. Mild central vascular congestion is noted without edema. No bony abnormality is seen. IMPRESSION: Mild central  vascular congestion without edema. Electronically Signed   By: Alcide Clever M.D.   On: 10/05/2023 01:39   ECHOCARDIOGRAM COMPLETE  Result Date: 10/03/2023    ECHOCARDIOGRAM REPORT   Patient Name:   RUSTON RAPOZO Date of Exam: 10/03/2023 Medical Rec #:  [id number]           Height:       68.0 in Accession #:    [id number]         Weight:       277.9 lb Date of Birth:  [birth year]         BSA:          2.350 m Patient Age:    42 years           BP:           110/77 mmHg Patient Gender: M                  HR:           69 bpm. Exam Location:  Inpatient Procedure: 2D Echo, Cardiac Doppler, Color Doppler and Intracardiac            Opacification Agent Indications:    CHF- Acute Systolic I50.1  History:        Patient has prior history of Echocardiogram examinations, most                 recent 02/16/2022. CHF and Cardiomyopathy, Defibrillator,                 Arrythmias:Atrial Fibrillation, Tachycardia, Atrial Flutter and                 PVC, Signs/Symptoms:Hypotension and Syncope; Risk Factors:Sleep                 Apnea.  Sonographer:    Lucendia Herrlich RCS Referring Phys: Ivar Drape DIAZ IMPRESSIONS  1. Left ventricular ejection fraction, by estimation, is 35 to 40%. The left ventricle has moderately decreased function. The left ventricle demonstrates global hypokinesis. Left ventricular diastolic parameters are indeterminate.  2. Right ventricular systolic function is moderately reduced. The right ventricular size is mildly enlarged. There is mildly elevated pulmonary artery systolic pressure. The estimated right ventricular systolic pressure is 39.0 mmHg.  3. The mitral valve is normal in structure. Trivial mitral valve regurgitation. No evidence of mitral stenosis.  4. The aortic valve is tricuspid. Aortic valve regurgitation is not visualized. Aortic valve sclerosis is present, with no evidence of aortic valve stenosis. Aortic valve Vmax measures 1.10 m/s.  5. The inferior vena cava is dilated in size with <50% respiratory variability, suggesting right atrial pressure of 15 mmHg. FINDINGS  Left Ventricle: Left ventricular ejection fraction, by estimation, is 35 to 40%. The left ventricle has moderately decreased function. The left ventricle demonstrates global hypokinesis. Definity  contrast agent was given IV to delineate the left ventricular endocardial borders. The left ventricular internal cavity size was normal in size. There is no left ventricular hypertrophy. Left ventricular diastolic parameters are indeterminate. Normal left ventricular filling pressure. Right Ventricle: The right ventricular size is mildly enlarged. No increase in right ventricular wall thickness. Right ventricular systolic function is moderately reduced. There is mildly elevated pulmonary artery systolic pressure. The tricuspid regurgitant velocity is 2.45 m/s, and with an assumed right atrial pressure of 15 mmHg, the estimated right ventricular systolic pressure is 39.0 mmHg. Left Atrium: Left atrial size was normal in size. Right Atrium: Right atrial size  was normal in size. Pericardium: There is no evidence of pericardial effusion. Mitral Valve: The mitral valve is normal in structure. Trivial mitral valve regurgitation. No evidence of mitral valve stenosis. Tricuspid Valve: The tricuspid valve is normal in structure. Tricuspid valve regurgitation is trivial. No evidence of tricuspid stenosis. Aortic Valve: The aortic valve is tricuspid. Aortic valve regurgitation is not visualized. Aortic valve sclerosis is present, with no evidence of aortic valve stenosis. Aortic valve peak gradient measures 4.8 mmHg. Pulmonic Valve: The pulmonic valve was normal in structure. Pulmonic valve regurgitation is not visualized. No evidence of pulmonic stenosis. Aorta: The aortic root is normal in size and structure. Venous: The inferior vena cava is dilated in size with less than 50% respiratory variability, suggesting right atrial pressure of 15 mmHg. IAS/Shunts: No atrial level shunt detected by color flow Doppler. Additional Comments: A device lead is visualized.  LEFT VENTRICLE PLAX 2D LVIDd:         4.90 cm   Diastology LVIDs:         3.80 cm   LV e' medial:    7.07 cm/s LV PW:         1.00 cm   LV E/e' medial:  13.3 LV IVS:         1.00 cm   LV e' lateral:   10.90 cm/s LVOT diam:     1.80 cm   LV E/e' lateral: 8.6 LV SV:         47 LV SV Index:   20 LVOT Area:     2.54 cm  RIGHT VENTRICLE            IVC RV S prime:     9.79 cm/s  IVC diam: 3.40 cm TAPSE (M-mode): 1.6 cm LEFT ATRIUM             Index        RIGHT ATRIUM           Index LA diam:        5.10 cm 2.17 cm/m   RA Area:     24.50 cm LA Vol (A2C):   57.7 ml 24.55 ml/m  RA Volume:   71.70 ml  30.51 ml/m LA Vol (A4C):   76.1 ml 32.38 ml/m LA Biplane Vol: 67.5 ml 28.72 ml/m  AORTIC VALVE AV Area (Vmax): 2.41 cm AV Vmax:        110.00 cm/s AV Peak Grad:   4.8 mmHg LVOT Vmax:      104.33 cm/s LVOT Vmean:     69.033 cm/s LVOT VTI:       0.184 m  AORTA Ao Root diam: 2.60 cm Ao Asc diam:  2.50 cm MITRAL VALVE               TRICUSPID VALVE MV Area (PHT): 5.84 cm    TR Peak grad:   24.0 mmHg MV Decel Time: 130 msec    TR Vmax:        245.00 cm/s MR Peak grad: 29.2 mmHg MR Vmax:      270.00 cm/s  SHUNTS MV E velocity: 94.00 cm/s  Systemic VTI:  0.18 m MV A velocity: 49.20 cm/s  Systemic Diam: 1.80 cm MV E/A ratio:  1.91 Armanda Magic MD Electronically signed by Armanda Magic MD Signature Date/Time: 10/03/2023/12:29:44 PM    Final    DG Chest 2 View  Result Date: 09/29/2023 CLINICAL DATA:  Edema, history of CHF EXAM: CHEST - 2 VIEW COMPARISON:  02/23/2022 FINDINGS:  Left chest cardiac device and lead in unchanged position. Mild cardiomegaly. Unchanged mediastinal contours. Prominence of the central vasculature without pulmonary edema. No focal pulmonary opacity. No pleural effusion or pneumothorax. No acute osseous abnormality. IMPRESSION: Mild cardiomegaly and prominence of the central vasculature without pulmonary edema. Electronically Signed   By: Wiliam Ke M.D.   On: 09/29/2023 14:17    Microbiology: No results found for this or any previous visit (from the past 240 hour(s)).   Labs: Basic Metabolic Panel: Recent Labs  Lab 10/18/23 0412 10/19/23 0500  10/20/23 0545 10/20/23 1230 10/21/23 1505 10/22/23 0450 10/22/23 1520 10/23/23 0519 10/24/23 0440  NA 134*   < > 130*   < > 132* 129* 132* 128* 128*  K 3.0*   < > 2.5*   < > 3.4* 2.9* 5.0 4.1 3.7  CL 97*   < > 91*   < > 91* 91* 94* 95* 96*  CO2 27   < > 26   < > 28 24 24  21* 22  GLUCOSE 118*   < > 160*   < > 112* 264* 122* 187* 110*  BUN 51*   < > 42*   < > 48* 50* 48* 55* 54*  CREATININE 2.45*   < > 2.59*   < > 2.80* 2.68* 2.64* 3.52* 3.04*  CALCIUM 9.1   < > 9.0   < > 9.3 9.1 9.9 9.3 9.5  MG 2.7*  --  2.3  --  2.4  --   --   --   --    < > = values in this interval not displayed.   Liver Function Tests: No results for input(s): "AST", "ALT", "ALKPHOS", "BILITOT", "PROT", "ALBUMIN" in the last 168 hours. No results for input(s): "LIPASE", "AMYLASE" in the last 168 hours. No results for input(s): "AMMONIA" in the last 168 hours. CBC: No results for input(s): "WBC", "NEUTROABS", "HGB", "HCT", "MCV", "PLT" in the last 168 hours. Cardiac Enzymes: No results for input(s): "CKTOTAL", "CKMB", "CKMBINDEX", "TROPONINI" in the last 168 hours. BNP: BNP (last 3 results) Recent Labs    05/23/23 1618 09/30/23 0513 10/13/23 0300  BNP 209.9* 222.1* 285.0*    ProBNP (last 3 results) No results for input(s): "PROBNP" in the last 8760 hours.  CBG: No results for input(s): "GLUCAP" in the last 168 hours.     Signed:  Zannie Cove MD.  [facility] 10/24/2023, 11:37 AM

## 2023-10-24 NOTE — Plan of Care (Signed)
  Problem: Education: Goal: Knowledge of General Education information will improve Description: Including pain rating scale, medication(s)/side effects and non-pharmacologic comfort measures Outcome: Progressing   Problem: Health Behavior/Discharge Planning: Goal: Ability to manage health-related needs will improve Outcome: Progressing   Problem: Clinical Measurements: Goal: Ability to maintain clinical measurements within normal limits will improve Outcome: Progressing Goal: Will remain free from infection Outcome: Progressing Goal: Diagnostic test results will improve Outcome: Progressing Goal: Cardiovascular complication will be avoided Outcome: Progressing   Problem: Activity: Goal: Risk for activity intolerance will decrease Outcome: Progressing   Problem: Education: Goal: Ability to demonstrate management of disease process will improve Outcome: Progressing Goal: Ability to verbalize understanding of medication therapies will improve Outcome: Progressing Goal: Individualized Educational Video(s) Outcome: Progressing   Problem: Activity: Goal: Capacity to carry out activities will improve Outcome: Progressing   Problem: Cardiac: Goal: Ability to achieve and maintain adequate cardiopulmonary perfusion will improve Outcome: Progressing

## 2023-10-24 NOTE — Progress Notes (Addendum)
Nutrition Education Note  RD consulted for nutrition education regarding a Heart Healthy diet.   Lipid Panel  No results found for: "CHOL", "TRIG", "HDL", "CHOLHDL", "VLDL", "LDLCALC"  RD provided "Heart Healthy Nutrition Therapy" handout from the Academy of [employer]etics. Reviewed patient's dietary recall. Per nutrition hx, patient has been making a good effort in avoiding added salt, high sodium and low fat foods on a usual basis. We reviewed some examples on ways to decrease sodium and fat intake in diet. Discouraged intake of processed foods and use of salt shaker. Encouraged fresh fruits and vegetables as well as whole grain sources of carbohydrates to maximize fiber intake.  Discussed fluids and amount prescribed per physician with written education materials for reference.  Expect  compliance.  Body mass index is 36.17 kg/m. Pt meets criteria for Class II Obesity based on current BMI.  Current diet order is heart healthy, 1500 mL fluid restriction, patient is consuming approximately 100% of meals at this time. Labs and medications reviewed. No further nutrition interventions warranted at this time. RD contact information provided. If additional nutrition issues arise, please re-consult RD.  Bradley Powell, RDLD Clinical Dietitian If unable to reach, please contact "RD Inpatient" secure chat group between 8 am-4 pm daily"

## 2023-10-26 DIAGNOSIS — H6122 Impacted cerumen, left ear: Secondary | ICD-10-CM | POA: Diagnosis not present

## 2023-10-31 ENCOUNTER — Telehealth (HOSPITAL_COMMUNITY): Payer: Self-pay

## 2023-10-31 NOTE — Telephone Encounter (Signed)
Attempted to call patient in regards to Cardiac Rehab - LM on VM 

## 2023-11-02 ENCOUNTER — Encounter (HOSPITAL_COMMUNITY): Payer: Medicaid Other | Admitting: Internal Medicine

## 2023-11-21 ENCOUNTER — Telehealth: Payer: Self-pay

## 2023-11-21 ENCOUNTER — Encounter: Payer: 59 | Admitting: Physician Assistant

## 2023-11-21 ENCOUNTER — Inpatient Hospital Stay (HOSPITAL_COMMUNITY)
Admission: EM | Admit: 2023-11-21 | Discharge: 2023-11-30 | DRG: 308 | Disposition: A | Discharge status: Home / Self Care | Payer: Medicaid Other | Attending: Internal Medicine | Admitting: Internal Medicine

## 2023-11-21 ENCOUNTER — Emergency Department (HOSPITAL_COMMUNITY): Payer: Medicaid Other

## 2023-11-21 DIAGNOSIS — I482 Chronic atrial fibrillation, unspecified: Secondary | ICD-10-CM | POA: Diagnosis not present

## 2023-11-21 DIAGNOSIS — I472 Ventricular tachycardia, unspecified: Secondary | ICD-10-CM | POA: Diagnosis not present

## 2023-11-21 DIAGNOSIS — K219 Gastro-esophageal reflux disease without esophagitis: Secondary | ICD-10-CM | POA: Diagnosis present

## 2023-11-21 DIAGNOSIS — G8929 Other chronic pain: Secondary | ICD-10-CM | POA: Diagnosis not present

## 2023-11-21 DIAGNOSIS — E871 Hypo-osmolality and hyponatremia: Secondary | ICD-10-CM | POA: Diagnosis not present

## 2023-11-21 DIAGNOSIS — Z888 Allergy status to other drugs, medicaments and biological substances status: Secondary | ICD-10-CM

## 2023-11-21 DIAGNOSIS — K7460 Unspecified cirrhosis of liver: Secondary | ICD-10-CM | POA: Diagnosis present

## 2023-11-21 DIAGNOSIS — D6869 Other thrombophilia: Secondary | ICD-10-CM | POA: Diagnosis present

## 2023-11-21 DIAGNOSIS — N189 Chronic kidney disease, unspecified: Secondary | ICD-10-CM | POA: Diagnosis not present

## 2023-11-21 DIAGNOSIS — I272 Pulmonary hypertension, unspecified: Secondary | ICD-10-CM | POA: Diagnosis present

## 2023-11-21 DIAGNOSIS — I130 Hypertensive heart and chronic kidney disease with heart failure and stage 1 through stage 4 chronic kidney disease, or unspecified chronic kidney disease: Secondary | ICD-10-CM | POA: Diagnosis present

## 2023-11-21 DIAGNOSIS — G4733 Obstructive sleep apnea (adult) (pediatric): Secondary | ICD-10-CM | POA: Diagnosis present

## 2023-11-21 DIAGNOSIS — Z79899 Other long term (current) drug therapy: Secondary | ICD-10-CM | POA: Diagnosis not present

## 2023-11-21 DIAGNOSIS — Z7901 Long term (current) use of anticoagulants: Secondary | ICD-10-CM

## 2023-11-21 DIAGNOSIS — I5042 Chronic combined systolic (congestive) and diastolic (congestive) heart failure: Secondary | ICD-10-CM | POA: Diagnosis not present

## 2023-11-21 DIAGNOSIS — Z79890 Hormone replacement therapy (postmenopausal): Secondary | ICD-10-CM | POA: Diagnosis not present

## 2023-11-21 DIAGNOSIS — Z6837 Body mass index (BMI) 37.0-37.9, adult: Secondary | ICD-10-CM | POA: Diagnosis not present

## 2023-11-21 DIAGNOSIS — E785 Hyperlipidemia, unspecified: Secondary | ICD-10-CM | POA: Diagnosis not present

## 2023-11-21 DIAGNOSIS — E039 Hypothyroidism, unspecified: Secondary | ICD-10-CM | POA: Diagnosis not present

## 2023-11-21 DIAGNOSIS — N179 Acute kidney failure, unspecified: Secondary | ICD-10-CM | POA: Diagnosis present

## 2023-11-21 DIAGNOSIS — I4821 Permanent atrial fibrillation: Secondary | ICD-10-CM | POA: Diagnosis present

## 2023-11-21 DIAGNOSIS — Z95810 Presence of automatic (implantable) cardiac defibrillator: Secondary | ICD-10-CM | POA: Diagnosis not present

## 2023-11-21 DIAGNOSIS — E66812 Obesity, class 2: Secondary | ICD-10-CM | POA: Diagnosis not present

## 2023-11-21 DIAGNOSIS — E611 Iron deficiency: Secondary | ICD-10-CM | POA: Diagnosis not present

## 2023-11-21 DIAGNOSIS — I5022 Chronic systolic (congestive) heart failure: Secondary | ICD-10-CM | POA: Diagnosis not present

## 2023-11-21 DIAGNOSIS — I5043 Acute on chronic combined systolic (congestive) and diastolic (congestive) heart failure: Secondary | ICD-10-CM | POA: Diagnosis not present

## 2023-11-21 DIAGNOSIS — I5023 Acute on chronic systolic (congestive) heart failure: Secondary | ICD-10-CM | POA: Diagnosis present

## 2023-11-21 DIAGNOSIS — I4891 Unspecified atrial fibrillation: Secondary | ICD-10-CM | POA: Diagnosis present

## 2023-11-21 DIAGNOSIS — I493 Ventricular premature depolarization: Secondary | ICD-10-CM | POA: Diagnosis present

## 2023-11-21 DIAGNOSIS — N1832 Chronic kidney disease, stage 3b: Secondary | ICD-10-CM | POA: Diagnosis not present

## 2023-11-21 DIAGNOSIS — E876 Hypokalemia: Principal | ICD-10-CM | POA: Diagnosis present

## 2023-11-21 DIAGNOSIS — Z7984 Long term (current) use of oral hypoglycemic drugs: Secondary | ICD-10-CM | POA: Diagnosis not present

## 2023-11-21 DIAGNOSIS — Z8249 Family history of ischemic heart disease and other diseases of the circulatory system: Secondary | ICD-10-CM

## 2023-11-21 DIAGNOSIS — I4892 Unspecified atrial flutter: Secondary | ICD-10-CM | POA: Diagnosis not present

## 2023-11-21 DIAGNOSIS — Z823 Family history of stroke: Secondary | ICD-10-CM

## 2023-11-21 DIAGNOSIS — Z820 Family history of epilepsy and other diseases of the nervous system: Secondary | ICD-10-CM

## 2023-11-21 DIAGNOSIS — I440 Atrioventricular block, first degree: Secondary | ICD-10-CM | POA: Diagnosis not present

## 2023-11-21 DIAGNOSIS — I428 Other cardiomyopathies: Secondary | ICD-10-CM | POA: Diagnosis present

## 2023-11-21 LAB — CBC WITH DIFFERENTIAL/PLATELET
Abs Immature Granulocytes: 0.04 10*3/uL (ref 0.00–0.07)
Basophils Absolute: 0.1 10*3/uL (ref 0.0–0.1)
Basophils Relative: 1 %
Eosinophils Absolute: 0.1 10*3/uL (ref 0.0–0.5)
Eosinophils Relative: 1 %
HCT: 45 % (ref 39.0–52.0)
Hemoglobin: 14.5 g/dL (ref 13.0–17.0)
Immature Granulocytes: 1 %
Lymphocytes Relative: 8 %
Lymphs Abs: 0.6 10*3/uL — ABNORMAL LOW (ref 0.7–4.0)
MCH: 25.8 pg — ABNORMAL LOW (ref 26.0–34.0)
MCHC: 32.2 g/dL (ref 30.0–36.0)
MCV: 79.9 fL — ABNORMAL LOW (ref 80.0–100.0)
Monocytes Absolute: 0.9 10*3/uL (ref 0.1–1.0)
Monocytes Relative: 12 %
Neutro Abs: 5.9 10*3/uL (ref 1.7–7.7)
Neutrophils Relative %: 77 %
Platelets: 266 10*3/uL (ref 150–400)
RBC: 5.63 MIL/uL (ref 4.22–5.81)
RDW: 16.6 % — ABNORMAL HIGH (ref 11.5–15.5)
WBC: 7.5 10*3/uL (ref 4.0–10.5)
nRBC: 0 % (ref 0.0–0.2)

## 2023-11-21 LAB — COMPREHENSIVE METABOLIC PANEL
ALT: 26 U/L (ref 0–44)
AST: 36 U/L (ref 15–41)
Albumin: 2.8 g/dL — ABNORMAL LOW (ref 3.5–5.0)
Alkaline Phosphatase: 107 U/L (ref 38–126)
Anion gap: 14 (ref 5–15)
BUN: 30 mg/dL — ABNORMAL HIGH (ref 6–20)
CO2: 27 mmol/L (ref 22–32)
Calcium: 8.9 mg/dL (ref 8.9–10.3)
Chloride: 93 mmol/L — ABNORMAL LOW (ref 98–111)
Creatinine, Ser: 2.05 mg/dL — ABNORMAL HIGH (ref 0.61–1.24)
GFR, Estimated: 41 mL/min — ABNORMAL LOW (ref >60)
Glucose, Bld: 110 mg/dL — ABNORMAL HIGH (ref 70–99)
Potassium: 2.5 mmol/L — CL (ref 3.5–5.1)
Sodium: 134 mmol/L — ABNORMAL LOW (ref 135–145)
Total Bilirubin: 3.3 mg/dL — ABNORMAL HIGH (ref 0.0–1.2)
Total Protein: 6 g/dL — ABNORMAL LOW (ref 6.5–8.1)

## 2023-11-21 LAB — TSH: TSH: 21.175 u[IU]/mL — ABNORMAL HIGH (ref 0.350–4.500)

## 2023-11-21 LAB — CBG MONITORING, ED: Glucose-Capillary: 121 mg/dL — ABNORMAL HIGH (ref 70–99)

## 2023-11-21 LAB — T4, FREE: Free T4: 0.7 ng/dL (ref 0.61–1.12)

## 2023-11-21 LAB — MAGNESIUM: Magnesium: 1.8 mg/dL (ref 1.7–2.4)

## 2023-11-21 MED ORDER — LEVOTHYROXINE SODIUM 50 MCG PO TABS
50.0000 ug | ORAL_TABLET | Freq: Every day | ORAL | Status: DC
  Administered 2023-11-22 – 2023-11-30 (×9): 50 ug via ORAL
  Filled 2023-11-21 (×6): qty 1
  Filled 2023-11-21: qty 2
  Filled 2023-11-21 (×2): qty 1

## 2023-11-21 MED ORDER — SODIUM CHLORIDE 0.9% FLUSH
3.0000 mL | Freq: Two times a day (BID) | INTRAVENOUS | Status: DC
  Administered 2023-11-21 – 2023-11-30 (×16): 3 mL via INTRAVENOUS

## 2023-11-21 MED ORDER — MAGNESIUM SULFATE 2 GM/50ML IV SOLN
2.0000 g | Freq: Once | INTRAVENOUS | Status: AC
  Administered 2023-11-21: 2 g via INTRAVENOUS
  Filled 2023-11-21: qty 50

## 2023-11-21 MED ORDER — DICLOFENAC SODIUM 1 % EX GEL
2.0000 g | Freq: Four times a day (QID) | CUTANEOUS | Status: DC
  Administered 2023-11-21 – 2023-11-30 (×23): 2 g via TOPICAL
  Filled 2023-11-21: qty 100

## 2023-11-21 MED ORDER — APIXABAN 5 MG PO TABS
5.0000 mg | ORAL_TABLET | Freq: Two times a day (BID) | ORAL | Status: DC
  Administered 2023-11-21 – 2023-11-30 (×18): 5 mg via ORAL
  Filled 2023-11-21 (×18): qty 1

## 2023-11-21 MED ORDER — POTASSIUM CHLORIDE CRYS ER 20 MEQ PO TBCR
40.0000 meq | EXTENDED_RELEASE_TABLET | Freq: Once | ORAL | Status: AC
  Administered 2023-11-21: 40 meq via ORAL
  Filled 2023-11-21: qty 2

## 2023-11-21 MED ORDER — POTASSIUM CHLORIDE 10 MEQ/100ML IV SOLN
10.0000 meq | INTRAVENOUS | Status: AC
  Administered 2023-11-21 (×5): 10 meq via INTRAVENOUS
  Filled 2023-11-21 (×3): qty 100

## 2023-11-21 MED ORDER — SODIUM CHLORIDE 0.9% FLUSH: 3.0000 mL | INTRAVENOUS | Status: DC | PRN

## 2023-11-21 MED ORDER — POTASSIUM CHLORIDE CRYS ER 20 MEQ PO TBCR
40.0000 meq | EXTENDED_RELEASE_TABLET | Freq: Once | ORAL | Status: AC
Start: 2023-11-21 — End: 2023-11-21
  Administered 2023-11-21: 40 meq via ORAL
  Filled 2023-11-21: qty 2

## 2023-11-21 MED ORDER — POTASSIUM CHLORIDE 10 MEQ/100ML IV SOLN
10.0000 meq | INTRAVENOUS | Status: DC
  Administered 2023-11-21: 10 meq via INTRAVENOUS
  Filled 2023-11-21 (×3): qty 100

## 2023-11-21 MED ORDER — AMIODARONE HCL 200 MG PO TABS
200.0000 mg | ORAL_TABLET | Freq: Every day | ORAL | Status: DC
Start: 2023-11-22 — End: 2023-11-24
  Administered 2023-11-22 – 2023-11-23 (×2): 200 mg via ORAL
  Filled 2023-11-21 (×2): qty 1

## 2023-11-21 MED ORDER — ACETAMINOPHEN 325 MG PO TABS
650.0000 mg | ORAL_TABLET | ORAL | Status: DC | PRN
Start: 2023-11-21 — End: 2023-11-30
  Filled 2023-11-21: qty 2

## 2023-11-21 MED ORDER — DICLOFENAC SODIUM 1 % EX GEL: 2.0000 g | Freq: Four times a day (QID) | CUTANEOUS | Status: DC

## 2023-11-21 MED ORDER — SODIUM CHLORIDE 0.9 % IV SOLN: 250.0000 mL | INTRAVENOUS | Status: AC | PRN

## 2023-11-21 MED ORDER — SPIRONOLACTONE 25 MG PO TABS
25.0000 mg | ORAL_TABLET | Freq: Every day | ORAL | Status: DC
Start: 2023-11-21 — End: 2023-11-22
  Administered 2023-11-21 – 2023-11-22 (×2): 25 mg via ORAL
  Filled 2023-11-21 (×2): qty 2

## 2023-11-21 NOTE — H&P (Signed)
 [facility] H&P   Patient Demographics:    Bradley Powell, is a 43 y.o. male  MRN: [id number]   DOB - 1980-11-22  Admit Date - 11/21/2023  Outpatient Primary MD for the patient is Rexanne Ingle, MD  Outpatient Specialists: Dr Chet    Patient coming from:  Home  Chief Complaint  Patient presents with   Dizziness      HPI:    Bradley Powell  is a 43 y.o. male, with history of chronic combined systolic and diastolic heart failure EF 40%, history of nonischemic cardiomyopathy, history of PVCs has AICD, OSA on CPAP, history of atrial flutter status post ablation on Eliquis  and amiodarone , CKD stage III B, hypothyroidism, obesity, GERD, who is on high doses of diuretics at home.  Patient with above history who was doing fairly well for the last few weeks until yesterday after his lunch he developed some nausea, subsequently around 5:30 PM he had a large emesis, thereafter he felt better and went to sleep.  This morning he got a phone call from [employer] informing him that he had 3 AICD discharges 1 around 6 PM yesterday and the other 2 while he was sleeping.  Patient never felt any shocks and otherwise was asymptomatic.  He presented to the ER where he was found to be profoundly hypokalemic.  Cardiology was consulted and I was requested to admit the patient.  Patient currently is relatively symptom-free denies any headache, no fever chills, no nausea vomiting, no chest or abdominal pain, no diarrhea or dysuria, no blood in stool or urine no focal weakness.  He is currently feeling at his baseline.    Review of systems:     A full 10 point Review of Systems was done, except as stated above, all other  Review of Systems were negative.   With Past History of the following :    Past Medical History:  Diagnosis Date   Atrial flutter (HCC)    a. s/p ablation 03/2018.   Chronic combined systolic and diastolic CHF (congestive heart failure) (HCC)    History of esophagogastroduodenoscopy (EGD)    Morbid obesity (HCC)    NICM (nonischemic cardiomyopathy) (HCC)    OSA on CPAP    mild OSA with an AHI of 7.7/hr on auto CPAP   PVC's (premature ventricular contractions)  Past Surgical History:  Procedure Laterality Date   A-FLUTTER ABLATION N/A 04/06/2018   Procedure: A-FLUTTER ABLATION;  Surgeon: Waddell Danelle ORN, MD;  Location: [facility] INVASIVE CV LAB;  Service: Cardiovascular;  Laterality: N/A;   BIOPSY  07/11/2023   Procedure: BIOPSY;  Surgeon: Elicia Claw, MD;  Location: [facility] ENDOSCOPY;  Service: Gastroenterology;;   ESOPHAGEAL BRUSHING  06/19/2020   Procedure: ESOPHAGEAL BRUSHING;  Surgeon: Dianna Specking, MD;  Location: [facility] ENDOSCOPY;  Service: Endoscopy;;   ESOPHAGOGASTRODUODENOSCOPY (EGD) WITH PROPOFOL  Left 06/19/2020   Procedure: ESOPHAGOGASTRODUODENOSCOPY (EGD) WITH PROPOFOL ;  Surgeon: Dianna Specking, MD;  Location[facility] ENDOSCOPY;  Service: Endoscopy;  Laterality: Left;   ESOPHAGOGASTRODUODENOSCOPY (EGD) WITH PROPOFOL  N/A 07/11/2023   Procedure: ESOPHAGOGASTRODUODENOSCOPY (EGD) WITH PROPOFOL ;  Surgeon: Elicia Claw, MD;  Location: [facility] ENDOSCOPY;  Service: Gastroenterology;  Laterality: N/A;   ICD IMPLANT N/A 02/22/2022   Procedure: ICD IMPLANT;  Surgeon: Waddell Danelle ORN, MD;  Location: [facility] INVASIVE CV LAB;  Service: Cardiovascular;  Laterality: N/A;   RIGHT HEART CATH N/A 12/20/2019   Procedure: RIGHT HEART CATH;  Surgeon: Cherrie Toribio SAUNDERS, MD;  Location: [facility] INVASIVE CV LAB;  Service: Cardiovascular;  Laterality: N/A;   RIGHT/LEFT HEART CATH AND CORONARY ANGIOGRAPHY N/A 03/01/2017   Procedure: Right/Left Heart Cath and Coronary Angiography;  Surgeon: Toribio SAUNDERS Cherrie,  MD;  Location: [facility] INVASIVE CV LAB;  Service: Cardiovascular;  Laterality: N/A;      Social History:     Social History   Tobacco Use   Smoking status: Never   Smokeless tobacco: Never  Substance Use Topics   Alcohol use: Yes    Comment: rarely        Family History :     Family History  Problem Relation Age of Onset   CVA Mother        pacemaker   Multiple sclerosis Mother    Seizures Mother    Hypertension Father    Gout Father       Home Medications:   Prior to Admission medications   Medication Sig Start Date End Date Taking? Authorizing Provider  acetaminophen  (TYLENOL ) 500 MG tablet Take 1,000 mg by mouth as needed for mild pain (pain score 1-3) (Leg pain).   Yes [provider]  allopurinol  (ZYLOPRIM ) 100 MG tablet Take 1 tablet (100 mg total) by mouth daily. Please contact PCP for further refills 10/24/23  Yes Fairy Frames, MD  amiodarone  (PACERONE ) 200 MG tablet Take 1 tablet (200 mg total) by mouth daily. 04/18/23  Yes Milford, Harlene HERO, FNP  diclofenac  Sodium (VOLTAREN ) 1 % GEL Apply 2 g topically daily as needed (pain).   Yes [provider]  ELIQUIS  5 MG TABS tablet TAKE 1 TABLET BY MOUTH TWICE A DAY 08/17/23  Yes Bensimhon, Toribio SAUNDERS, MD  esomeprazole  (NEXIUM ) 40 MG capsule Take 40 mg by mouth 2 (two) times daily. 01/08/22  Yes [provider]  JARDIANCE  10 MG TABS tablet TAKE 1 TABLET BY MOUTH EVERY DAY BEFORE BREAKFAST 03/31/23  Yes Bensimhon, Toribio SAUNDERS, MD  levothyroxine  (SYNTHROID ) 50 MCG tablet TAKE 1 TABLET (50 MCG TOTAL) BY MOUTH DAILY BEFORE BREAKFAST. TAKE 1 HOUR BEFORE BREAKFAST 10/17/23  Yes Milford, Hockinson, FNP  metolazone  (ZAROXOLYN ) 5 MG tablet Take 5 mg by mouth once a week. 11/04/23  Yes [provider]  mexiletine (MEXITIL ) 150 MG capsule Take 1 capsule (150 mg total) by mouth every 12 (twelve) hours. 08/09/23  Yes Milford, Harlene HERO, FNP  polyethylene glycol powder (GLYCOLAX /MIRALAX ) 17 GM/SCOOP  powder  Take 1 CAPFUL (17 g) by mouth daily as needed. 10/24/23  Yes Fairy Frames, MD  potassium chloride  SA (KLOR-CON  M) 20 MEQ tablet Take 1-2 tablets (20-40 mEq total) by mouth 2 (two) times daily. PLEASE TAKE 40 mEq (2 tabs in the morning)  and 20 mEq (1 tab in the afternoon/evening) 10/24/23  Yes Fairy Frames, MD  sodium chloride  (OCEAN) 0.65 % nasal spray Place 2 sprays into the nose as needed for congestion.   Yes [provider]  torsemide  (DEMADEX ) 20 MG tablet Take 4 tablets (80 mg) in the morning and 2 tablets (40 mg)in the afternoon 10/24/23  Yes Fairy Frames, MD  spironolactone  (ALDACTONE ) 25 MG tablet Take 25 mg by mouth daily. Patient not taking: Reported on 11/21/2023 11/05/23   [provider]  UNABLE TO FIND Referral to [facility] -Please accept this patient as a referral for CKD 4 and diastolic CHF  Frames Fairy MD [facility] 10/24/23   Fairy Frames, MD     Allergies:     Allergies  Allergen Reactions   Entresto  [Sacubitril -Valsartan ] Nausea And Vomiting and Other (See Comments)    Lightheaded, fainting     Physical Exam:   Vitals  Blood pressure 106/70, pulse 71, temperature 97.6 F (36.4 C), temperature source Oral, resp. rate 18, SpO2 99%.   1. General  lying in bed in NAD,    2. Normal affect and insight, Not Suicidal or Homicidal, Awake Alert,   3. No F.N deficits, ALL C.Nerves Intact, Strength 5/5 all 4 extremities, Sensation intact all 4 extremities, Plantars down going.  4. Ears and Eyes appear Normal, Conjunctivae clear, PERRLA. Moist Oral Mucosa.  5. Supple Neck, No JVD, No cervical lymphadenopathy appriciated, No Carotid Bruits.  6. Symmetrical Chest wall movement, Good air movement bilaterally, CTAB.  7. RRR, No Gallops, Rubs or Murmurs, No Parasternal Heave.  8. Positive Bowel Sounds, Abdomen Soft, No tenderness, No organomegaly appriciated,No rebound -guarding or rigidity.  9.  No Cyanosis,  Normal Skin Turgor, No Skin Rash or Bruise.  10. Good muscle tone,  joints appear normal , no effusions, Normal ROM.  11. No Palpable Lymph Nodes in Neck or Axillae, trace edema    Data Review:   Recent Labs  Lab 11/21/23 1259  WBC 7.5  HGB 14.5  HCT 45.0  PLT 266  MCV 79.9*  MCH 25.8*  MCHC 32.2  RDW 16.6*  LYMPHSABS 0.6*  MONOABS 0.9  EOSABS 0.1  BASOSABS 0.1    Recent Labs  Lab 11/21/23 1259  NA 134*  K 2.5*  CL 93*  CO2 27  ANIONGAP 14  GLUCOSE 110*  BUN 30*  CREATININE 2.05*  AST 36  ALT 26  ALKPHOS 107  BILITOT 3.3*  ALBUMIN 2.8*  CALCIUM  8.9    No results found for: CHOL, HDL, LDLCALC, LDLDIRECT, TRIG, CHOLHDL  Recent Labs  Lab 11/21/23 1259  CALCIUM  8.9    Recent Labs  Lab 11/21/23 1259  WBC 7.5  PLT 266  CREATININE 2.05*    Urinalysis    Component Value Date/Time   COLORURINE AMBER (A) 10/05/2023 0230   APPEARANCEUR CLEAR 10/05/2023 0230   LABSPEC 1.008 10/05/2023 0230   PHURINE 7.0 10/05/2023 0230   GLUCOSEU >=500 (A) 10/05/2023 0230   HGBUR NEGATIVE 10/05/2023 0230   BILIRUBINUR NEGATIVE 10/05/2023 0230   KETONESUR NEGATIVE 10/05/2023 0230   PROTEINUR NEGATIVE 10/05/2023 0230   NITRITE NEGATIVE 10/05/2023 0230   LEUKOCYTESUR NEGATIVE 10/05/2023 0230  Imaging Results:    DG Chest 1 View Result Date: 11/21/2023 CLINICAL DATA:  Near syncopal episode with defibrillator firing. EXAM: CHEST  1 VIEW COMPARISON:  Chest radiograph dated 10/09/2023 FINDINGS: Lines/tubes: Left chest wall ICD lead projects over the right ventricle. Lungs: Well inflated lungs. No focal consolidation. Pleura: No pneumothorax or pleural effusion. Heart/mediastinum: Similar mildly enlarged cardiomediastinal silhouette. Bones: No acute osseous abnormality. IMPRESSION: 1.  No focal consolidations. 2. Similar mild cardiomegaly. Electronically Signed   By: Limin  Xu M.D.   On: 11/21/2023 13:35    My personal review of EKG: Rhythm a flutter  heart rate 73 bpm nonspecific ST changes   Assessment & Plan:    AICD discharges x 3.  Patient asymptomatic.  Likely due to hypokalemia, he did have an episode of 1 emesis yesterday however he is also on very high doses of diuretics with relatively low potassium supplementation, currently not taking Aldactone .  He will be admitted to the hospital, telemetry monitor, prophylactic 2 g of IV magnesium , aggressively replete potassium IV and oral, cardiology has been consulted, continue amiodarone , due to renal insufficiency not on ACE ARB or Entresto .  Blood pressure low for beta-blocker for now.  Tonight we will hold his home diuretics including Jardiance .  2.  Hypokalemia.  Please see above.  3.  A-flutter.  Continue amiodarone  and Eliquis .  He is status post ablation in the past.  4. Obesity with OSA.  Nighttime CPAP, follow-up with PCP for weight loss.  5.  GERD.  On PPI.  6.  Single episode of emesis yesterday evening.  Completely resolved, likely had food poisoning.  Monitor with supportive care.  7.  Hypothyroidism.  Continue home dose Synthroid  check TSH and free T4.    DVT Prophylaxis Eliquis   AM Labs Ordered, also please review Full Orders  Family Communication: Admission, patients condition and plan of care including tests being ordered have been discussed with the patient who indicates understanding and agree with the plan and Code Status.  Code Status Full  Likely DC to  Home  Condition GUARDED    Consults called: Cards    Admission status: Inpt    Time spent in minutes : 35  Signature  -    Lavada Stank M.D on 11/21/2023 at 6:20 PM   -  To page go to [email or web address]

## 2023-11-21 NOTE — Consult Note (Signed)
 Cardiology  Consult:   Patient ID: Bradley Powell; MRN: [id number]; DOB: 1981-07-23   Admission date: 11/21/2023  Primary Care Provider: Rexanne Ingle, MD Primary Cardiologist: Dr. Fontaine Primary Electrophysiologist:  Dr. Waddell  Chief Complaint:  VT storm  Patient Profile:   Bradley Powell is a 43 y.o. male with a hx of (HFrEF) heart failure with reduced ejection fraction 2/2 infiltrative cardiomyopathy (LMNA cardiomyopathy), ventricular tachycardia, s/p ICD, atrial flutter s/p CTI ablation in 2019, chronic atrial fibrillation, chronic kidney disease, obesity, OSA, hypertension, chronic kidney disease, gout who is being seen 09/30/2023 for the evaluation of VT storm at the requst to Dr. Golston.   History of Present Illness:   [name], a 43 year old with a familial laminopathy, reduced ejection fraction, ventricular tachycardia, atrial flutter, chronic atrial fibrillation, and chronic kidney disease, presents with new onset nausea and vomiting. The patient has been managed with amiodarone , mexiletine, and torsemide , but is not on a beta blocker due to bradycardia and heart block. The patient has had no recent issues with lower extremity edema and swelling, which were previously frequent.  The patient's nausea and vomiting have been intermittent and are associated as a solitary episode after eating at [employer] with his family. The patient has also experienced episodes of lightheadedness and feeling like he was going to pass out. The patient was contacted by the outpatient cardiology team due to multiple ICD discharges. The patient is unsure if he missed some of his morning medications.  The patient's ejection fraction was recently evaluated at 35-40%. The patient is currently receiving magnesium  and potassium supplementation. The patient reports not taking his spironolactone , which he believes may have been the missed medication and could be contributing to his  hypokalemia.  The patient recalls a recent episode of vomiting after eating at a restaurant and consuming potentially more salty food than usual. The patient has a history of electrolyte imbalances, including a past episode of hyperkalemia in 2023 after taking an over-the-counter supplement (BULK).   Allergies:    Allergies  Allergen Reactions   Entresto  [Sacubitril -Valsartan ] Nausea And Vomiting and Other (See Comments)    Lightheaded, fainting    Social History:   Social History   Socioeconomic History   Marital status: Single    Spouse name: Not on file   Number of children: Not on file   Years of education: Not on file   Highest education level: Not on file  Occupational History   Not on file  Tobacco Use   Smoking status: Never   Smokeless tobacco: Never  Vaping Use   Vaping status: Never Used  Substance and Sexual Activity   Alcohol use: Yes    Comment: rarely   Drug use: No   Sexual activity: Not Currently    Partners: Female  Other Topics Concern   Not on file  Social History Narrative   Not on file   Social Drivers of Health   Financial Resource Strain: Not on file  Food Insecurity: No Food Insecurity (09/30/2023)   Hunger Vital Sign    Worried About Running Out of Food in the Last Year: Never true    Ran Out of Food in the Last Year: Never true  Transportation Needs: No Transportation Needs (09/30/2023)   PRAPARE - Administrator, Civil Service (Medical): No    Lack of Transportation (Non-Medical): No  Physical Activity: Not on file  Stress: Not on file  Social Connections: Not on file  Intimate Partner Violence: Not At Risk (09/30/2023)   Humiliation, Afraid, Rape, and Kick questionnaire    Fear of Current or Ex-Partner: No    Emotionally Abused: No    Physically Abused: No    Sexually Abused: No    Family History:   The patient's family history includes CVA in his mother; Gout in his father; Hypertension in his father; Multiple  sclerosis in his mother; Seizures in his mother.    ROS:  Please see the history of present illness.   Physical Exam/Data:   Vitals:   11/21/23 1244 11/21/23 1656  BP: 102/69 106/70  Pulse: 79 71  Resp: 16 18  Temp: 97.9 F (36.6 C) 97.6 F (36.4 C)  TempSrc:  Oral  SpO2: 98% 99%   No intake or output data in the 24 hours ending 11/21/23 1829 There were no vitals filed for this visit. There is no height or weight on file to calculate BMI.   Gen: no distress, Obese male   Neck: No JVD Cardiac: No Rubs or Gallops, no murmur, RRR +2 radial pulses Respiratory: Clear to auscultation bilaterally, normal effort, normal  respiratory rate GI: Soft, nontender, non-distended  MS: No  edema;  moves all extremities Integument: Skin feels warm Neuro:  At time of evaluation, alert and oriented to person/place/time/situation  Psych: Normal affect, patient feels well  EKG:  The ECG that was done  was personally reviewed and demonstrates Atrial flutter rate controlled QTC: 460 ms  Relevant CV Studies:  Cardiac Studies & Procedures   CARDIAC CATHETERIZATION  CARDIAC CATHETERIZATION 12/20/2019  Narrative Findings:  RA = 14 RV = 43/15 PA = 43/21 (31) PCW = 15 Fick cardiac output/index = 5.7/2.4 PVR = 2.8 WU Ao sat = 97% PA sat = 73%, 75% SVC sat = 67%  Assessment:  1. Mild PAH with normal left-sided filling pressures and cardiac output  Plan/Discussion:  Encouraged weight loss and f/u on CPAP compliance. (Will refer to Dr. Shlomo).  Toribio Fuel, MD 1:27 PM   CARDIAC CATHETERIZATION  CARDIAC CATHETERIZATION 03/01/2017  Narrative Findings:  AO = 109/86 (97) LV =  101/28 RA =  29 RV = 50/22 PA = 55/24 (38) PCW = 26 Fick cardiac output/index = 6.4/2.6 Thermal CO/CI = 4.9/2.0 PVR = 2.5 WU FA sat = 98% PA sat = 69%, 71% SVC sat = 82%, 82% IVC sat = 66%  Assessment: 1. Normal coronary arteries 2. Mild to moderate pulmonary HTN with normal PVR 3.  Biventricular volume overload with R > L heart failure 4. Moderately decreased CO by thermodilution 5. 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 No intra-cardiac shunt  Plan/Discussion:  Continue diuresis. Support with milrinone as needed. Needs weight loss and pulmonary toilet.  The HF team is happy to manage.  Bensimhon, Toribio, MD 10:18 AM  Findings Coronary Findings Diagnostic  Dominance: Co-dominant  Left Main Vessel is angiographically normal.  Left Anterior Descending Vessel is angiographically normal.  Left Circumflex Vessel is angiographically normal.  Right Coronary Artery Vessel is angiographically normal.  Intervention  No interventions have been documented.    ECHOCARDIOGRAM  ECHOCARDIOGRAM LIMITED 10/16/2023  Narrative ECHOCARDIOGRAM LIMITED REPORT    Patient Name:   MICKEAL DAWS [facility] Date of Exam: 10/16/2023 Medical Rec #:  [id number]          Height:       68.0 in Accession #:    [id number]  Weight:       249.2 lb Date of Birth:  23-May-1981         BSA:          2.244 m Patient Age:    42 years           BP:           101/71 mmHg Patient Gender: M                  HR:           75 bpm. Exam Location:  Inpatient  Procedure: Limited Echo and Limited Color Doppler  Indications:    CHF- Acute Systolic I50.21  History:        Patient has prior history of Echocardiogram examinations, most recent 10/03/2023. Cardiomyopathy and CHF, Defibrillator, Arrythmias:Atrial Fibrillation, Atrial Flutter, PVC and Tachycardia, Signs/Symptoms:Hypotension and Syncope; Risk Factors:Sleep Apnea.  Sonographer:    Thea Norlander RCS Referring Phys: [phone] ALMA L DIAZ  IMPRESSIONS   1. Limited study for LV function 2. Left ventricular ejection fraction, by estimation, is 35 to 40%. Left ventricular ejection fraction by 2D MOD biplane is 37.9 %. The left ventricle has  moderately decreased function. The left ventricle demonstrates global hypokinesis. There is mild left ventricular hypertrophy. 3. The mitral valve is abnormal. Mild mitral valve regurgitation. 4. Tricuspid valve regurgitation is moderate. 5. Right ventricular systolic function is mildly reduced. The right ventricular size is normal. There is normal pulmonary artery systolic pressure. The estimated right ventricular systolic pressure is 35.2 mmHg. 6. The inferior vena cava is dilated in size with <50% respiratory variability, suggesting right atrial pressure of 15 mmHg. 7. Rhythm strip during this exam demonstrates atrial flutter.  Comparison(s): No significant change from prior study. 10/03/2023: LVEF 35-40%.  FINDINGS Left Ventricle: Left ventricular ejection fraction, by estimation, is 35 to 40%. Left ventricular ejection fraction by 2D MOD biplane is 37.9 %. The left ventricle has moderately decreased function. The left ventricle demonstrates global hypokinesis. The left ventricular internal cavity size was normal in size. There is mild left ventricular hypertrophy.  Right Ventricle: The right ventricular size is normal. No increase in right ventricular wall thickness. Right ventricular systolic function is mildly reduced. There is normal pulmonary artery systolic pressure. The tricuspid regurgitant velocity is 2.25 m/s, and with an assumed right atrial pressure of 15 mmHg, the estimated right ventricular systolic pressure is 35.2 mmHg.  Pericardium: Trivial pericardial effusion is present. The pericardial effusion is circumferential.  Mitral Valve: The mitral valve is abnormal. There is moderate thickening of the anterior and posterior mitral valve leaflet(s). Mild mitral valve regurgitation, with posteriorly-directed jet.  Tricuspid Valve: The tricuspid valve is grossly normal. Tricuspid valve regurgitation is moderate.  Pulmonic Valve: The pulmonic valve was grossly normal. Pulmonic  valve regurgitation is trivial.  Aorta: The aortic root and ascending aorta are structurally normal, with no evidence of dilitation.  Venous: The inferior vena cava is dilated in size with less than 50% respiratory variability, suggesting right atrial pressure of 15 mmHg.  EKG: Rhythm strip during this exam demonstrates atrial flutter.  Additional Comments: A device lead is visualized.  LEFT VENTRICLE PLAX 2D                        Biplane EF (MOD) LVIDd:         5.10 cm         LV Biplane EF:   Left LVIDs:  4.20 cm                          ventricular LV PW:         1.20 cm                          ejection LV IVS:        0.90 cm                          fraction by LVOT diam:     2.00 cm                          2D MOD LVOT Area:     3.14 cm                         biplane is 37.9 %.  LV Volumes (MOD)               Diastology LV vol d, MOD    110.0 ml      LV e' medial:    8.81 cm/s A2C:                           LV E/e' medial:  10.7 LV vol d, MOD    110.0 ml      LV e' lateral:   8.92 cm/s A4C:                           LV E/e' lateral: 10.6 LV vol s, MOD    62.4 ml A2C: LV vol s, MOD    68.7 ml A4C: LV SV MOD A2C:   47.6 ml LV SV MOD A4C:   110.0 ml LV SV MOD BP:    41.7 ml  RIGHT VENTRICLE            IVC RV S prime:     8.86 cm/s  IVC diam: 2.80 cm TAPSE (M-mode): 1.3 cm  LEFT ATRIUM         Index LA diam:    5.10 cm 2.27 cm/m  AORTA Ao Root diam: 2.60 cm Ao Asc diam:  2.20 cm  MITRAL VALVE               TRICUSPID VALVE MV Area (PHT): 6.71 cm    TR Peak grad:   20.2 mmHg MV Decel Time: 113 msec    TR Vmax:        225.00 cm/s MR Peak grad: 63.7 mmHg MR Vmax:      399.00 cm/s  SHUNTS MV E velocity: 94.70 cm/s  Systemic Diam: 2.00 cm MV A velocity: 53.40 cm/s MV E/A ratio:  1.77  Vinie Maxcy MD Electronically signed by Vinie Maxcy MD Signature Date/Time: 10/16/2023/5:16:29 PM    Final     CARDIAC MRI  MR CARDIAC MORPHOLOGY W WO CONTRAST  09/16/2021  Narrative CLINICAL DATA:  36M with chronic combined heart failure. Most recent echo 04/2021 EF 45-50%. Cath with normal coronaries. CMR 05/2020 markedly elevated ECV (55%) and diffuse LGE concerning for amyloidosis. PYP scan equivocal.  EXAM: CARDIAC MRI  TECHNIQUE: The patient was scanned on a 1.5 Tesla Siemens magnet. A dedicated cardiac coil was used. Functional imaging was done using Fiesta sequences. 2,3,  and 4 chamber views were done to assess for RWMA's. Modified Simpson's rule using a short axis stack was used to calculate an ejection fraction on a dedicated work [employer]. The patient received 15 cc of Gadavist . After 10 minutes inversion recovery sequences were used to assess for infiltration and scar tissue.  CONTRAST:  15cc  of Gadavist   FINDINGS: Left ventricle:  -Normal wall thickness  -Normal size  -Mild systolic dysfunction  -Elevated ECV (43%)  -Regional elevation in T2 (up to 71ms in apical septum)  -Diffuse LGE, most notably midwall basal septum extending into RV, subepicardial in inferior wall, midwall in lateral wall, and circumferential at apex  LV EF: 43% (Normal 56-78%)  Absolute volumes:  LV EDV: (Normal 77-195 mL)  LV ESV: (Normal 19-72 mL)  LV SV: 80mL (Normal 51-133 mL)  CO: 5.4L/min (Normal 2.8-8.8 L/min)  Indexed volumes:  LV EDV: 51mL/sq-m (Normal 47-92 mL/sq-m)  LV ESV: 44mL/sq-m (Normal 13-30 mL/sq-m)  LV SV: 63mL/sq-m (Normal 32-62 mL/sq-m)  CI: 2.2L/min/sq-m (Normal 1.7-4.2 L/min/sq-m)  Right ventricle: Mild dilatation with mild systolic dysfunction  RV EF:  55% (Normal 47-74%)  Absolute volumes:  RV EDV: (Normal 88-227 mL)  RV ESV: (Normal 23-103 mL)  RV SV: (Normal 52-138 mL)  CO: 7.7L/min (Normal 2.8-8.8 L/min)  Indexed volumes:  RV EDV: 139mL/sq-m (Normal 55-105 mL/sq-m)  RV ESV: 92mL/sq-m (Normal 15-43 mL/sq-m)  RV SV: 63mL/sq-m (Normal  32-64 mL/sq-m)  CI: 3.1L/min/sq-m (Normal 1.7-4.2 L/min/sq-m)  Left atrium: Moderate enlargement  Right atrium: Moderate enlargement  Mitral valve: Trivial regurgitation  Aortic valve: No regurgitation  Tricuspid valve: Trivial regurgitation  Pulmonic valve: Trivial regurgitation  Aorta: Normal proximal ascending aorta  Pericardium: Small effusion  IMPRESSION: 1. While ECV is markedly elevated (43%), which is in the amyloid range, LV morphology does not suggest amyloid as wall thickness is normal. Rather, LGE pattern suggests cardiac sarcoidosis. Suspect marked ECV elevation is due to diffuse LGE in setting of sarcoidosis. Recommend CT chest to evaluate for pulmonary sarcoid and cardiac PET to evaluate for active inflammation  2. Diffuse LGE, most notably midwall basal septum extending into RV, subepicardial in inferior wall, midwall in lateral wall, and circumferential at apex. Concerning for sarcoidosis as above  3. Regional elevation in T2 values (up to 71ms in apical septum) suggestive of myocardial inflammation, as can be seen in sarcoid  4.  Normal LV size with mild systolic dysfunction (EF 43%)  5.  Mild RV dilatation with mild systolic dysfunction (EF 44%)  6.  Small pericardial effusion   Electronically Signed By: Lonni Nanas M.D. On: 09/18/2021 22:03         Laboratory Data:  Chemistry Recent Labs  Lab 11/21/23 1259  NA 134*  K 2.5*  CL 93*  CO2 27  GLUCOSE 110*  BUN 30*  CREATININE 2.05*  CALCIUM  8.9  GFRNONAA 41*  ANIONGAP 14    Recent Labs  Lab 11/21/23 1259  PROT 6.0*  ALBUMIN 2.8*  AST 36  ALT 26  ALKPHOS 107  BILITOT 3.3*   Hematology Recent Labs  Lab 11/21/23 1259  WBC 7.5  RBC 5.63  HGB 14.5  HCT 45.0  MCV 79.9*  MCH 25.8*  MCHC 32.2  RDW 16.6*  PLT 266   Cardiac EnzymesNo results for input(s): TROPONINI in the last 168 hours. No results for input(s): TROPIPOC in the last 168 hours.  BNPNo  results for input(s): BNP, PROBNP in the last 168 hours.  DDimer No results for input(s): DDIMER in the last 168 hours.   Assessment and Plan:   Ventricular Tachycardia Storm - related to severe hypokalemia - given K and restarting MRA; BMP at 2300 (signed out to evening team) - Continue current AAD - EP to see in AM  Genetic Familial Laminopathy Chronic Heart Failure with Reduced Ejection Fraction  Familial laminopathy with reduced ejection fraction (35-40%) and ventricular tachycardia, status post ICD. Recent episodes of nausea, vomiting, lightheadedness, and near syncope with multiple ICD discharges. Possible missed morning medications contributing to symptoms. - Hold metolazone  and torsemide  until potassium levels are stable - Monitor telemetry - he either needs trial of maintenance diuretic therapy and f/u the next day for K check OK discharge tomorrow on diuretics with very close follow up  Atrial Flutter with Variable Heart Block Chronic atrial flutter with prior CTI ablation. Persistent atrial flutter with variable heart block. QTC of 460. Chronic residual atrial fibrillation. - Continue amiodarone  - continue AC  Chronic Kidney Disease with Electrolyte Imbalance Chronic kidney disease with recent hypokalemia likely complicated by missed spironolactone . History of electrolyte imbalances, including hyperkalemia in 2023. - Administer 2 grams of magnesium  and K - Repeat BMP this evening - Hold torsemide  for today - Hold Jardiance  until tomorrow  Protein Calorie Malnutrition -cautioned against use of supplement give labile K  Follow-up - Repeat BMP this evening - Discharge in one to two days, possibly after trialing optimal diuretic regimen plus potassium - close BMP f/u with AHF team   For questions or updates, please contact [facility] Please consult [email or web address] for contact info under Cardiology/STEMI.   Stanly Leavens, MD FASE [facility] Cardiologist [facility]  [address] [place], [address] [place], [place] [address] [phone]  6:29 PM

## 2023-11-21 NOTE — Progress Notes (Deleted)
 Cardiology Office Note:  .   Date:  11/21/2023  ID:  Bradley Powell, DOB 1981-06-11, MRN [id number] PCP: Rexanne Ingle, MD  [facility] Providers Cardiologist:  None Electrophysiologist:  Danelle Birmingham, MD  Advanced Heart Failure:  Toribio Fuel, MD  Sleep Medicine:  Wilbert Bihari, MD {  History of Present Illness: .   Bradley Powell is a 43 y.o. male w/PMHx of LMNA cardiomyopathy, chronic CHF (systolic), VT/cardiac arrest, AFlutter (s/p CTI ablation 2019), CKD ( IIIb), ICD, obesity, HTN, AFib (described as permanent)  He has a non-ischemic cardiomyopathy.  Cardiac catheterization in 2018 with no CAD.  CMR was positive for infiltrative CM  genetics testing has been positive for LMNA cardiomyopathy  Recently admitted 09/29/23 with acute HF exacerbation, massive volume OL, treated with aggressive diuresis/lasix  gtt for days, eventually assing metolazone , diamox , intermittent pausing of diuretics required for hypokalemia and AKI 10/16/23 finally reporting some overall improvement AFib described as rate controlled >> OFF BETABLOCKER 2/2 BRADYCARDIA/HEART BLOCK, no plans for DCCV/rhythm control by notes DIURESED 71 POUNDS Discharged 10/24/23 Creat peak at 3.5 > 3.0 at discharge  NO SHOW to his HF clinic f/u appointment No labs since discharge in Epic, care everywhere or the [facility] system No office visits since discharge in Epic or care everywhere  Device clinic alert today for VT epsidee that occurred 11/19/22 Rates 200's, morphology c/w VT > ATPs all failed > HV tx successful Total time 26 sec RN reach out confirms symptomatic with reports of diaphoresis, lightheaded and syncope, asymptomatic today at the time of his call  In d/w myself given recent long hospitalization with massive volume OL, no clear follow up with anyone since then, kidney disease and electrolyte issues in the hospital, ALREADY on 2 AADs, duration of his episode, advised he be sent to the ER for  evaluation and management Advised to not to drive > pt was made aware of recommendation to go to the ER, he was going to wait on a ride.  Today's visit is scheduled as an add on for ICD shock/VT  ROS:   *** meds *** volume *** eliquis , dose, bleeding *** compliance? Meds, diet *** programming?   Device information BSCi single chamber ICD implanted 02/22/22 Secondary prevention  Arrhythmia/AAD hx VT Mexiletine started April 2023 >> recurrent VT and amiodarone  added during same hospitalization in April 2023  AFib is described as permanent  Studies Reviewed: SABRA    EKG done today and reviewed by myself:  ***  DEVICE interrogation done today and reviewed by myself *** Battery and lead measurements are good ***   TTE 10/03/2023  1. Left ventricular ejection fraction, by estimation, is 35 to 40%. The  left ventricle has moderately decreased function. The left ventricle  demonstrates global hypokinesis. Left ventricular diastolic parameters are  indeterminate.   2. Right ventricular systolic function is moderately reduced. The right  ventricular size is mildly enlarged. There is mildly elevated pulmonary  artery systolic pressure. The estimated right ventricular systolic  pressure is 39.0 mmHg.   3. The mitral valve is normal in structure. Trivial mitral valve  regurgitation. No evidence of mitral stenosis.   4. The aortic valve is tricuspid. Aortic valve regurgitation is not  visualized. Aortic valve sclerosis is present, with no evidence of aortic  valve stenosis. Aortic valve Vmax measures 1.10 m/s.   5. The inferior vena cava is dilated in size with <50% respiratory  variability, suggesting right atrial pressure of 15 mmHg.  Risk Assessment/Calculations:    Physical Exam:   VS:  There were no vitals taken for this visit.   Wt Readings from Last 3 Encounters:  10/24/23 237 lb 14 oz (107.9 kg)  07/25/23 271 lb (122.9 kg)  07/11/23 258 lb (117 kg)    GEN: Well  nourished, well developed in no acute distress NECK: No JVD; No carotid bruits CARDIAC: ***RRR, no murmurs, rubs, gallops RESPIRATORY:  *** CTA b/l without rales, wheezing or rhonchi  ABDOMEN: Soft, non-tender, non-distended EXTREMITIES:  No edema; No deformity   ICD site: *** is stable, no thinning, fluctuation, tethering  ASSESSMENT AND PLAN: .    VT *** Amiodarone  Mexiletine  Permanent AFib CHA2DS2Vasc is 2, on Eliquis , *** appropriately dosed *** rate   NICM Chronic CHF *** C/w Dr. Boone  ICD *** intact function *** no programming changes made  Secondary hypercoagulable state 2/2 AFib    {Are you ordering a CV Procedure (e.g. stress test, cath, DCCV, TEE, etc)?   Press F2        :[id number]}     Dispo: ***  Signed, [name], PA-C

## 2023-11-21 NOTE — Progress Notes (Signed)
 EP will be seen in the a.m. only assigning to their list, they will let general cardiology know if we are needed.

## 2023-11-21 NOTE — ED Provider Triage Note (Signed)
 Emergency Medicine Provider Triage Evaluation Note  CALAN DOREN , a 43 y.o. male  was evaluated in triage.  Pt complains of possible syncope possible discharge of his defibrillator either 1 or 4 times.  Patient does not recall necessarily passing out, but feels as though he may have had an episode of syncope yesterday, and today was notified to seek medical care as his pacemaker/defibrillator fired.  He has a history of atrial flutter, heart failure, but states he been doing generally well until the past day..  Review of Systems  Positive: HPI Negative: Current chest pain or complaints  Physical Exam  BP 102/69 (BP Location: Right Arm)   Pulse 79   Temp 97.9 F (36.6 C)   Resp 16   SpO2 98%  Gen:   Awake, no distress speaking clearly Resp:  Normal effort increased work of breathing MSK:   Moves extremities without difficulty no appreciable deformity Other:  Neuro unremarkable  Medical Decision Making  Medically screening exam initiated at 12:55 PM.  Appropriate orders placed.  SHERYL TOWELL was informed that the remainder of the evaluation will be completed by another provider, this initial triage assessment does not replace that evaluation, and the importance of remaining in the ED until their evaluation is complete.   Garrick Charleston, MD 11/21/23 1256

## 2023-11-21 NOTE — ED Triage Notes (Signed)
 Pt states last night felt dizzy, lightheaded and doctor office called this morning and stated that pt defib fired last night at the same time pt was having symptoms and then fired again last night in his sleep. Pt denies any symptoms today. Pt states he was sitting on can last night when symptoms started but subsided after vomiting.

## 2023-11-21 NOTE — Telephone Encounter (Signed)
 Device alert for sustained VT/VF with successful therapy Event occurred 1/6 @ 18:19, duration 26sec, HR 212.  EGM c/w sustained VT/VF falling into the VT-1 zone.  ATP delivered x6, followed by 31J of HV therapy converting arrhythmia Route to triage high alert per protocol LA, CVRS  Spoke w/ pt. Ate dinner 2 hours prior. Felt fine. Was having a BM accompanied by N/V SOB lightheadedness dizziness and syncopal. Some lightheadedness dizziness post therapy but has since resolved. Reviewed w/ JP. Okay to add on to APP schedule for today as long as patient continues to be stable and APP can make decision on if ED is necessary. Called patient and let them know. 6 month driving restriction advised from 1/6. ED precautions given.

## 2023-11-21 NOTE — ED Provider Notes (Signed)
 [place] EMERGENCY DEPARTMENT AT [facility] Provider Note   CSN: [id number] Arrival date & time: 11/21/23  1231     History  Chief Complaint  Patient presents with   Dizziness    Bradley Powell is a 43 y.o. male.  HPI 43 year old male presents after his ICD shocked him last night.  He states last night around 6:20 he was in the bathroom throwing up and then started feeling very lightheaded like he was going to pass out.  He thinks he might of passed out but is not really sure.  He never felt any chest pain or ICD shocks.  He states vomiting is an on-and-off problem for him.  He thinks he might of exacerbated his chronic right knee pain but he does not think he actually injured it or broke it.  Right now he is feeling fine but was called by the cardiology team this morning as he had multiple ICD discharges last night.  He thinks he might of missed his morning meds a couple days ago but otherwise has been compliant.  Has not been recently ill otherwise. Currently asymptomatic.  Home Medications Prior to Admission medications   Medication Sig Start Date End Date Taking? Authorizing Provider  acetaminophen  (TYLENOL ) 500 MG tablet Take 1,000 mg by mouth as needed for mild pain (pain score 1-3) (Leg pain).   Yes [provider]  allopurinol  (ZYLOPRIM ) 100 MG tablet Take 1 tablet (100 mg total) by mouth daily. Please contact PCP for further refills 10/24/23  Yes Fairy Frames, MD  amiodarone  (PACERONE ) 200 MG tablet Take 1 tablet (200 mg total) by mouth daily. 04/18/23  Yes Milford, Harlene HERO, FNP  diclofenac  Sodium (VOLTAREN ) 1 % GEL Apply 2 g topically daily as needed (pain).   Yes [provider]  ELIQUIS  5 MG TABS tablet TAKE 1 TABLET BY MOUTH TWICE A DAY 08/17/23  Yes Bensimhon, Toribio SAUNDERS, MD  esomeprazole  (NEXIUM ) 40 MG capsule Take 40 mg by mouth 2 (two) times daily. 01/08/22  Yes [provider]  JARDIANCE  10 MG TABS tablet TAKE 1 TABLET BY MOUTH  EVERY DAY BEFORE BREAKFAST 03/31/23  Yes Bensimhon, Toribio SAUNDERS, MD  levothyroxine  (SYNTHROID ) 50 MCG tablet TAKE 1 TABLET (50 MCG TOTAL) BY MOUTH DAILY BEFORE BREAKFAST. TAKE 1 HOUR BEFORE BREAKFAST 10/17/23  Yes Milford, Delcambre, FNP  metolazone  (ZAROXOLYN ) 5 MG tablet Take 5 mg by mouth once a week. 11/04/23  Yes [provider]  mexiletine (MEXITIL ) 150 MG capsule Take 1 capsule (150 mg total) by mouth every 12 (twelve) hours. 08/09/23  Yes Milford, Harlene HERO, FNP  polyethylene glycol powder (GLYCOLAX /MIRALAX ) 17 GM/SCOOP powder Take 1 CAPFUL (17 g) by mouth daily as needed. 10/24/23  Yes Fairy Frames, MD  potassium chloride  SA (KLOR-CON  M) 20 MEQ tablet Take 1-2 tablets (20-40 mEq total) by mouth 2 (two) times daily. PLEASE TAKE 40 mEq (2 tabs in the morning)  and 20 mEq (1 tab in the afternoon/evening) 10/24/23  Yes Fairy Frames, MD  sodium chloride  (OCEAN) 0.65 % nasal spray Place 2 sprays into the nose as needed for congestion.   Yes [provider]  torsemide  (DEMADEX ) 20 MG tablet Take 4 tablets (80 mg) in the morning and 2 tablets (40 mg)in the afternoon 10/24/23  Yes Fairy Frames, MD  spironolactone  (ALDACTONE ) 25 MG tablet Take 25 mg by mouth daily. Patient not taking: Reported on 11/21/2023 11/05/23   [provider]  UNABLE TO FIND Referral to [facility] -Please accept this patient as a referral for CKD 4 and diastolic CHF  Sigurd Pac MD [facility] 10/24/23   Pac Sigurd, MD      Allergies    Entresto  [sacubitril -valsartan ]    Review of Systems   Review of Systems  Respiratory:  Negative for shortness of breath.   Cardiovascular:  Negative for chest pain.  Gastrointestinal:  Positive for vomiting.  Musculoskeletal:  Positive for arthralgias.  Neurological:  Positive for light-headedness.    Physical Exam Updated Vital Signs BP 106/70 (BP Location: Right Arm)   Pulse 71   Temp 97.6 F (36.4 C) (Oral)   Resp  18   SpO2 99%  Physical Exam Vitals and nursing note reviewed.  Constitutional:      Appearance: He is well-developed.  HENT:     Head: Normocephalic and atraumatic.  Cardiovascular:     Rate and Rhythm: Normal rate and regular rhythm.     Heart sounds: Normal heart sounds.  Pulmonary:     Effort: Pulmonary effort is normal.     Breath sounds: Normal breath sounds.  Abdominal:     Palpations: Abdomen is soft.     Tenderness: There is no abdominal tenderness.  Musculoskeletal:     Right knee: No swelling or deformity. Normal range of motion. Tenderness (mild, medial) present.  Skin:    General: Skin is warm and dry.  Neurological:     Mental Status: He is alert.     ED Results / Procedures / Treatments   Labs (all labs ordered are listed, but only abnormal results are displayed) Labs Reviewed  COMPREHENSIVE METABOLIC PANEL - Abnormal; Notable for the following components:      Result Value   Sodium 134 (*)    Potassium 2.5 (*)    Chloride 93 (*)    Glucose, Bld 110 (*)    BUN 30 (*)    Creatinine, Ser 2.05 (*)    Total Protein 6.0 (*)    Albumin 2.8 (*)    Total Bilirubin 3.3 (*)    GFR, Estimated 41 (*)    All other components within normal limits  CBC WITH DIFFERENTIAL/PLATELET - Abnormal; Notable for the following components:   MCV 79.9 (*)    MCH 25.8 (*)    RDW 16.6 (*)    Lymphs Abs 0.6 (*)    All other components within normal limits  CBG MONITORING, ED - Abnormal; Notable for the following components:   Glucose-Capillary 121 (*)    All other components within normal limits  MAGNESIUM   T4, FREE  TSH  CBC WITH DIFFERENTIAL/PLATELET  BRAIN NATRIURETIC PEPTIDE  COMPREHENSIVE METABOLIC PANEL  MAGNESIUM   PHOSPHORUS  HIV ANTIBODY (ROUTINE TESTING W REFLEX)  BASIC METABOLIC PANEL    EKG EKG Interpretation Date/Time:  Tuesday November 21 2023 12:46:54 EST Ventricular Rate:  73 PR Interval:    QRS Duration:  106 QT Interval:  418 QTC  Calculation: 460 R Axis:   167  Text Interpretation: Atrial flutter with variable A-V block Right axis deviation Low voltage QRS Inferior infarct , age undetermined Cannot rule out Anterior infarct , age undetermined Confirmed by Freddi Hamilton [phone]) on 11/21/2023 4:30:53 PM  Radiology DG Chest 1 View Result Date: 11/21/2023 CLINICAL DATA:  Near syncopal episode with defibrillator firing. EXAM: CHEST  1 VIEW COMPARISON:  Chest radiograph dated 10/09/2023 FINDINGS: Lines/tubes: Left chest wall ICD lead projects over the right ventricle. Lungs: Well inflated lungs. No focal consolidation. Pleura: No pneumothorax  or pleural effusion. Heart/mediastinum: Similar mildly enlarged cardiomediastinal silhouette. Bones: No acute osseous abnormality. IMPRESSION: 1.  No focal consolidations. 2. Similar mild cardiomegaly. Electronically Signed   By: Limin  Xu M.D.   On: 11/21/2023 13:35    Procedures .Critical Care  Performed by: Freddi Hamilton, MD Authorized by: Freddi Hamilton, MD   Critical care provider statement:    Critical care time (minutes):  30   Critical care time was exclusive of:  Separately billable procedures and treating other patients   Critical care was necessary to treat or prevent imminent or life-threatening deterioration of the following conditions:  Metabolic crisis   Critical care was time spent personally by me on the following activities:  Development of treatment plan with patient or surrogate, discussions with consultants, evaluation of patient's response to treatment, examination of patient, ordering and review of laboratory studies, ordering and review of radiographic studies, ordering and performing treatments and interventions, pulse oximetry, re-evaluation of patient's condition and review of old charts     Medications Ordered in ED Medications  diclofenac  Sodium (VOLTAREN ) 1 % topical gel 2 g (2 g Topical Not Given 11/21/23 2038)  potassium chloride  10 mEq in 100 mL IVPB  (10 mEq Intravenous New Bag/Given 11/21/23 1957)  amiodarone  (PACERONE ) tablet 200 mg (has no administration in time range)  apixaban  (ELIQUIS ) tablet 5 mg (has no administration in time range)  levothyroxine  (SYNTHROID ) tablet 50 mcg (has no administration in time range)  spironolactone  (ALDACTONE ) tablet 25 mg (25 mg Oral Given 11/21/23 2036)  sodium chloride  flush (NS) 0.9 % injection 3 mL (has no administration in time range)  sodium chloride  flush (NS) 0.9 % injection 3 mL (has no administration in time range)  0.9 %  sodium chloride  infusion (has no administration in time range)  acetaminophen  (TYLENOL ) tablet 650 mg (has no administration in time range)  potassium chloride  SA (KLOR-CON  M) CR tablet 40 mEq (40 mEq Oral Given 11/21/23 1749)  magnesium  sulfate IVPB 2 g 50 mL (0 g Intravenous Stopped 11/21/23 1911)  potassium chloride  SA (KLOR-CON  M) CR tablet 40 mEq (40 mEq Oral Given 11/21/23 2036)    ED Course/ Medical Decision Making/ A&P                                 Medical Decision Making Amount and/or Complexity of Data Reviewed External Data Reviewed: notes.    Details: Cardiology telephone note Labs: ordered.    Details: Potassium 2.5 Radiology: ordered and independent interpretation performed.    Details: No CHF ECG/medicine tests: ordered and independent interpretation performed.    Details: Atrial flutter  Risk Prescription drug management. Decision regarding hospitalization.   Patient presents with his ICD firing multiple times in the last 24 hours.  He also has moderate hypokalemia at 2.5 which is likely contributing.  He was started on oral and IV potassium.  Cardiology consulted and they will see patient and help manage but for now would recommend holding Demadex .  Discussed with Dr. Dennise of hospitalist, he request 6 runs of IV potassium and he will admit the patient.  Currently patient is asymptomatic.        Final Clinical Impression(s) / ED Diagnoses Final  diagnoses:  Hypokalemia  Ventricular tachycardia [facility])    Rx / DC Orders ED Discharge Orders     None         Freddi Hamilton, MD 11/21/23 2041

## 2023-11-21 NOTE — Telephone Encounter (Signed)
 Discussed w/ RU. Given recent hospitalization and ongoing acute issues pt would best be served in ED. Advised pt of these recommendations. He said it would be 2 hours before he can get a ride. Will forward message to Daphne Barrack NP. Pt advised to call 911 in the meantime w/ any acute changes.

## 2023-11-22 ENCOUNTER — Other Ambulatory Visit: Payer: Self-pay

## 2023-11-22 ENCOUNTER — Encounter (HOSPITAL_COMMUNITY): Payer: Self-pay | Admitting: Internal Medicine

## 2023-11-22 ENCOUNTER — Ambulatory Visit (INDEPENDENT_AMBULATORY_CARE_PROVIDER_SITE_OTHER): Payer: 59

## 2023-11-22 DIAGNOSIS — I5042 Chronic combined systolic (congestive) and diastolic (congestive) heart failure: Secondary | ICD-10-CM | POA: Diagnosis not present

## 2023-11-22 DIAGNOSIS — E876 Hypokalemia: Secondary | ICD-10-CM

## 2023-11-22 DIAGNOSIS — E039 Hypothyroidism, unspecified: Secondary | ICD-10-CM

## 2023-11-22 DIAGNOSIS — I440 Atrioventricular block, first degree: Secondary | ICD-10-CM | POA: Diagnosis not present

## 2023-11-22 DIAGNOSIS — I428 Other cardiomyopathies: Secondary | ICD-10-CM

## 2023-11-22 DIAGNOSIS — I472 Ventricular tachycardia, unspecified: Secondary | ICD-10-CM | POA: Diagnosis not present

## 2023-11-22 DIAGNOSIS — I4892 Unspecified atrial flutter: Secondary | ICD-10-CM

## 2023-11-22 LAB — COMPREHENSIVE METABOLIC PANEL
ALT: 25 U/L (ref 0–44)
AST: 28 U/L (ref 15–41)
Albumin: 2.5 g/dL — ABNORMAL LOW (ref 3.5–5.0)
Alkaline Phosphatase: 103 U/L (ref 38–126)
Anion gap: 12 (ref 5–15)
BUN: 36 mg/dL — ABNORMAL HIGH (ref 6–20)
CO2: 27 mmol/L (ref 22–32)
Calcium: 8.7 mg/dL — ABNORMAL LOW (ref 8.9–10.3)
Chloride: 92 mmol/L — ABNORMAL LOW (ref 98–111)
Creatinine, Ser: 2.07 mg/dL — ABNORMAL HIGH (ref 0.61–1.24)
GFR, Estimated: 41 mL/min — ABNORMAL LOW (ref >60)
Glucose, Bld: 100 mg/dL — ABNORMAL HIGH (ref 70–99)
Potassium: 2.7 mmol/L — CL (ref 3.5–5.1)
Sodium: 131 mmol/L — ABNORMAL LOW (ref 135–145)
Total Bilirubin: 2.8 mg/dL — ABNORMAL HIGH (ref 0.0–1.2)
Total Protein: 5.6 g/dL — ABNORMAL LOW (ref 6.5–8.1)

## 2023-11-22 LAB — BASIC METABOLIC PANEL
Anion gap: 12 (ref 5–15)
Anion gap: 14 (ref 5–15)
BUN: 32 mg/dL — ABNORMAL HIGH (ref 6–20)
BUN: 39 mg/dL — ABNORMAL HIGH (ref 6–20)
CO2: 29 mmol/L (ref 22–32)
CO2: 25 mmol/L (ref 22–32)
Calcium: 8.8 mg/dL — ABNORMAL LOW (ref 8.9–10.3)
Calcium: 9.2 mg/dL (ref 8.9–10.3)
Chloride: 92 mmol/L — ABNORMAL LOW (ref 98–111)
Chloride: 93 mmol/L — ABNORMAL LOW (ref 98–111)
Creatinine, Ser: 1.98 mg/dL — ABNORMAL HIGH (ref 0.61–1.24)
Creatinine, Ser: 1.95 mg/dL — ABNORMAL HIGH (ref 0.61–1.24)
GFR, Estimated: 43 mL/min — ABNORMAL LOW (ref >60)
GFR, Estimated: 44 mL/min — ABNORMAL LOW (ref >60)
Glucose, Bld: 109 mg/dL — ABNORMAL HIGH (ref 70–99)
Glucose, Bld: 127 mg/dL — ABNORMAL HIGH (ref 70–99)
Potassium: 3 mmol/L — ABNORMAL LOW (ref 3.5–5.1)
Potassium: 3.2 mmol/L — ABNORMAL LOW (ref 3.5–5.1)
Sodium: 133 mmol/L — ABNORMAL LOW (ref 135–145)
Sodium: 132 mmol/L — ABNORMAL LOW (ref 135–145)

## 2023-11-22 LAB — CBC WITH DIFFERENTIAL/PLATELET
Abs Immature Granulocytes: 0.02 10*3/uL (ref 0.00–0.07)
Basophils Absolute: 0 10*3/uL (ref 0.0–0.1)
Basophils Relative: 1 %
Eosinophils Absolute: 0.1 10*3/uL (ref 0.0–0.5)
Eosinophils Relative: 2 %
HCT: 43.8 % (ref 39.0–52.0)
Hemoglobin: 14 g/dL (ref 13.0–17.0)
Immature Granulocytes: 0 %
Lymphocytes Relative: 9 %
Lymphs Abs: 0.5 10*3/uL — ABNORMAL LOW (ref 0.7–4.0)
MCH: 25.4 pg — ABNORMAL LOW (ref 26.0–34.0)
MCHC: 32 g/dL (ref 30.0–36.0)
MCV: 79.3 fL — ABNORMAL LOW (ref 80.0–100.0)
Monocytes Absolute: 0.7 10*3/uL (ref 0.1–1.0)
Monocytes Relative: 13 %
Neutro Abs: 4.4 10*3/uL (ref 1.7–7.7)
Neutrophils Relative %: 75 %
Platelets: 248 10*3/uL (ref 150–400)
RBC: 5.52 MIL/uL (ref 4.22–5.81)
RDW: 16.6 % — ABNORMAL HIGH (ref 11.5–15.5)
WBC: 5.8 10*3/uL (ref 4.0–10.5)
nRBC: 0 % (ref 0.0–0.2)

## 2023-11-22 LAB — HIV ANTIBODY (ROUTINE TESTING W REFLEX): HIV Screen 4th Generation wRfx: NONREACTIVE

## 2023-11-22 LAB — BRAIN NATRIURETIC PEPTIDE: B Natriuretic Peptide: 292.3 pg/mL — ABNORMAL HIGH (ref 0.0–100.0)

## 2023-11-22 LAB — MAGNESIUM: Magnesium: 2.2 mg/dL (ref 1.7–2.4)

## 2023-11-22 LAB — PHOSPHORUS: Phosphorus: 3.5 mg/dL (ref 2.5–4.6)

## 2023-11-22 MED ORDER — POTASSIUM CHLORIDE 10 MEQ/100ML IV SOLN
10.0000 meq | INTRAVENOUS | Status: AC
  Administered 2023-11-22 (×3): 10 meq via INTRAVENOUS
  Filled 2023-11-22: qty 100

## 2023-11-22 MED ORDER — POTASSIUM CHLORIDE CRYS ER 20 MEQ PO TBCR
40.0000 meq | EXTENDED_RELEASE_TABLET | Freq: Two times a day (BID) | ORAL | Status: DC
  Administered 2023-11-23: 40 meq via ORAL
  Filled 2023-11-22: qty 2

## 2023-11-22 MED ORDER — ENSURE ENLIVE PO LIQD
237.0000 mL | Freq: Two times a day (BID) | ORAL | Status: DC
  Administered 2023-11-22 – 2023-11-28 (×5): 237 mL via ORAL

## 2023-11-22 MED ORDER — MEXILETINE HCL 150 MG PO CAPS
150.0000 mg | ORAL_CAPSULE | Freq: Two times a day (BID) | ORAL | Status: DC
  Administered 2023-11-22 – 2023-11-30 (×16): 150 mg via ORAL
  Filled 2023-11-22 (×17): qty 1

## 2023-11-22 MED ORDER — SPIRONOLACTONE 25 MG PO TABS
25.0000 mg | ORAL_TABLET | Freq: Two times a day (BID) | ORAL | Status: DC
Start: 2023-11-22 — End: 2023-11-23
  Administered 2023-11-22 – 2023-11-23 (×2): 25 mg via ORAL
  Filled 2023-11-22 (×2): qty 1

## 2023-11-22 MED ORDER — POTASSIUM CHLORIDE CRYS ER 20 MEQ PO TBCR
40.0000 meq | EXTENDED_RELEASE_TABLET | ORAL | Status: AC
  Administered 2023-11-22 (×2): 40 meq via ORAL
  Filled 2023-11-22 (×2): qty 2

## 2023-11-22 NOTE — ED Notes (Signed)
 ICD has been interrogated.

## 2023-11-22 NOTE — Progress Notes (Signed)
 Progress Note   Patient: Bradley Powell FMW:[id number] DOB: 05-22-1981 DOA: 11/21/2023     1 DOS: the patient was seen and examined on 11/22/2023   Brief hospital course: Duaine Radin  is a 43 y.o. male, with history of chronic combined systolic and diastolic heart failure EF 40%, history of nonischemic cardiomyopathy, history of PVCs has AICD, OSA on CPAP, history of atrial flutter status post ablation on Eliquis  and amiodarone , CKD stage III B, hypothyroidism, obesity, GERD, who is on high doses of diuretics at home.  Patient with above history who was doing fairly well for the last few weeks until yesterday after his lunch he developed some nausea, subsequently around 5:30 PM he had a large emesis, thereafter he felt better and went to sleep.  This morning he got a phone call from [employer] informing him that he had 3 AICD discharges 1 around 6 PM yesterday and the other 2 while he was sleeping.  Patient never felt any shocks and otherwise was asymptomatic.  He presented to the ER where he was found to be profoundly hypokalemic.  Cardiology was consulted who advised correction of electrolytes  Assessment and Plan: Ventricular tachycardia S/p AICD shocks Due to very low hypokalemia. Cardiology on board. Continue home dose Amiodarone , Mexilitine. EP on board appreciate recommendations.  HFrEF History of nonischemic cardiomyopathy. Genetic familial laminopathy Continue to hold metolazone , torsemide  due to low potassium. Continue spironolactone  for now. Resume diuretics once potassium is improved.  Atrial flutter: Heart rate stable on telemetry. Patient will be continued on amiodarone . Continue Eliquis  therapy.  CKD stage IIIB Creatinine stable around 2. Avoid nephrotoxic drugs. Continue to monitor renal function.  Obesity BMI 37.74. Diet, exercise and weight reduction advised.      Out of bed to chair. Incentive spirometry. Nursing supportive  care. Fall, aspiration precautions. DVT prophylaxis   Code Status: Full Code  Subjective: Patient is seen and examined today morning, He is lying in bed. Denies chest pain, dizziness or shortness of breath. States he did not feel defibrillator shocks but felt sick, nauseous and dizzy.  Physical Exam: Vitals:   11/22/23 1412 11/22/23 1500 11/22/23 1539 11/22/23 1935  BP:  106/82 105/79 111/76  Pulse:  74 75 69  Resp:  16 18 18   Temp: 97.7 F (36.5 C)  97.6 F (36.4 C) 97.8 F (36.6 C)  TempSrc: Oral  Oral Oral  SpO2:  100% 93% 100%  Weight:  112.6 kg    Height:  5' 8 (1.727 m)      General -  Young  obese African American male, no apparent distress HEENT - PERRLA, EOMI, atraumatic head, non tender sinuses. Lung - Clear, basal rales, no rhonchi, wheezes. Heart - S1, S2 heard, no murmurs, rubs, trace pedal edema. Abdomen - Soft, non tender, bowel sounds good Neuro - Alert, awake and oriented x 3, non focal exam. Skin - Warm and dry.  Data Reviewed:      Latest Ref Rng & Units 11/22/2023    6:37 AM 11/21/2023   12:59 PM 10/16/2023    4:25 AM  CBC  WBC 4.0 - 10.5 K/uL 5.8  7.5  9.4   Hemoglobin 13.0 - 17.0 g/dL 85.9  85.4  88.0   Hematocrit 39.0 - 52.0 % 43.8  45.0  38.4   Platelets 150 - 400 K/uL 248  266  344       Latest Ref Rng & Units 11/22/2023    5:46 PM 11/22/2023  6:37 AM 11/21/2023   11:28 PM  BMP  Glucose 70 - 99 mg/dL 872  899  890   BUN 6 - 20 mg/dL 39  36  32   Creatinine 0.61 - 1.24 mg/dL 8.04  7.92  8.01   Sodium 135 - 145 mmol/L 132  131  133   Potassium 3.5 - 5.1 mmol/L 3.2  2.7  3.0   Chloride 98 - 111 mmol/L 93  92  92   CO2 22 - 32 mmol/L 25  27  29    Calcium  8.9 - 10.3 mg/dL 9.2  8.7  8.8    DG Chest 1 View Result Date: 11/21/2023 CLINICAL DATA:  Near syncopal episode with defibrillator firing. EXAM: CHEST  1 VIEW COMPARISON:  Chest radiograph dated 10/09/2023 FINDINGS: Lines/tubes: Left chest wall ICD lead projects over the right ventricle. Lungs:  Well inflated lungs. No focal consolidation. Pleura: No pneumothorax or pleural effusion. Heart/mediastinum: Similar mildly enlarged cardiomediastinal silhouette. Bones: No acute osseous abnormality. IMPRESSION: 1.  No focal consolidations. 2. Similar mild cardiomegaly. Electronically Signed   By: Limin  Xu M.D.   On: 11/21/2023 13:35   Family Communication: Discussed with patient he understand and agree. All questions answereed.  Disposition: Status is: Inpatient Remains inpatient appropriate because: need cardiac eval, telemetry, electrolyte correction  Planned Discharge Destination: Home     Time spent: 38 minutes  Author: Concepcion Riser, MD 11/22/2023 7:59 PM Secure chat 7am to 7pm For on call review [email or web address].

## 2023-11-22 NOTE — TOC Initial Note (Signed)
 Transition of Care [facility]) - Initial/Assessment Note    Patient Details  Name: Bradley Powell MRN: [id number] Date of Birth: 12-01-1980  Transition of [facility]) CM/SW Contact:    Marval Gell, RN Phone Number: 11/22/2023, 4:00 PM  Clinical Narrative:                  Patient admitted from home with AICD discharges x 3.  Patient independent PTA.  No HH Hx noted in [employer] portal, no needs anticipated at this time.  Coverage [employer] PCP [place] Bame MD   Expected Discharge Plan: Home/Self Care Barriers to Discharge: Continued Medical Work up   Patient Goals and CMS Choice Patient states their goals for this hospitalization and ongoing recovery are:: return home          Expected Discharge Plan and Services   Discharge Planning Services: CM Consult   Living arrangements for the past 2 months: Single Family Home                                      Prior Living Arrangements/Services Living arrangements for the past 2 months: Single Family Home                     Activities of Daily Living      Permission Sought/Granted                  Emotional Assessment              Admission diagnosis:  Hypokalemia [E87.6] Ventricular tachycardia (HCC) [I47.20] Patient Active Problem List   Diagnosis Date Noted   Hypokalemia 11/21/2023   Acute kidney injury superimposed on chronic kidney disease (HCC) 10/13/2023   Leukocytosis 10/13/2023   Hypothyroidism 10/13/2023   Iron deficiency 10/13/2023   Acute on chronic congestive heart failure (HCC) 09/30/2023   Fluid overload 09/29/2023   Ventricular tachycardia (HCC) 05/26/2022   ICD (implantable cardioverter-defibrillator) in place 05/26/2022   Syncope and collapse 02/15/2022   Acute on chronic systolic (congestive) heart failure (HCC) 02/15/2022   Hepatic cirrhosis (HCC) 06/16/2020   Dehydration with hyponatremia 06/16/2020   Hypotension due to hypovolemia 06/16/2020   Intractable nausea and  vomiting 06/16/2020   Acute kidney injury (HCC) 06/15/2020   Atrial fibrillation, chronic (HCC) 04/29/2020   Chronic combined systolic and diastolic congestive heart failure (HCC) 04/07/2018   NICM (nonischemic cardiomyopathy) (HCC) 04/07/2018   Hyperglycemia 04/07/2018   Typical atrial flutter (HCC) 04/06/2018   Paroxysmal atrial flutter (HCC) 04/06/2018   Gout 03/30/2017   Suspected sleep apnea 03/01/2017   Edema 02/24/2017   Testicular swelling 02/24/2017   CHF (congestive heart failure) (HCC) 02/24/2017   Overweight 02/24/2017   Costochondritis 02/24/2017   Sinus tachycardia 02/24/2017   First degree AV block 02/24/2017   Scrotal edema 02/24/2017   Obesity, class 2 02/24/2017   PCP:  Bame Ingle, MD Pharmacy:   [facility] [phone] - [address] [place] - [address] [place] [address] Phone: [phone] Fax: [phone]  [facility] [phone] - [place] - [address] ABRAN HURST [place] [phone] Phone: [phone] Fax: [phone]  [facility] [address] [place] [address] Phone: [phone] Fax: [phone]  [facility] - [place], [place] - [address] [place] [address] Phone: [phone] Fax:  [phone]     Social Drivers of Health (SDOH) Social History: SDOH Screenings   Food Insecurity: No Food Insecurity (09/30/2023)  Housing: Low Risk  (09/30/2023)  Transportation Needs: No Transportation Needs (09/30/2023)  Utilities: Not At Risk (09/30/2023)  Tobacco Use: Low Risk  (11/22/2023)   SDOH Interventions:     Readmission Risk Interventions     No data to display

## 2023-11-22 NOTE — Consult Note (Signed)
 Advanced Heart Failure Team Consult Note   Primary Physician: Rexanne Ingle, MD Cardiologist:  None  Reason for Consultation: Heart Failure   HPI:    Bradley Powell is seen today for evaluation of heart failure at the request of Dr Nancey.   Bradley Powell is a 43 year old LMNA cardiomyopathy, VT, CKD stage IIIb, permanent AF, and obesity.   Well known to the HF Team and has been followed closely. Most recent admit diuresed 65 pounds. Placed on torsemide  80 mg /40 mg. Discharge 10/24/2023 weight 237 pounds.   He no showed for post hospital follow up 11/02/2023.   Over the weekend he missed a day of medications.  On 1/6 he started feeling bad while having a bowel movement. Developed nausea, vomiting and had lightheadedness. Device clinic called him the next day for VT and instructed to go to the ED. Given  ATP x6 with shock for VT. SABRA    Readmitted with VT storm in the setting of hypokalemia. CXR mild cardiomegaly. K 2.5 and Mag 1.8.  on admit. Diuretics held to allow for aggressive electrolyte replacement.   Denies SOB.   Home Medications Prior to Admission medications   Medication Sig Start Date End Date Taking? Authorizing Provider  acetaminophen  (TYLENOL ) 500 MG tablet Take 1,000 mg by mouth as needed for mild pain (pain score 1-3) (Leg pain).   Yes [provider]  allopurinol  (ZYLOPRIM ) 100 MG tablet Take 1 tablet (100 mg total) by mouth daily. Please contact PCP for further refills 10/24/23  Yes Fairy Frames, MD  amiodarone  (PACERONE ) 200 MG tablet Take 1 tablet (200 mg total) by mouth daily. 04/18/23  Yes Milford, Harlene HERO, FNP  diclofenac  Sodium (VOLTAREN ) 1 % GEL Apply 2 g topically daily as needed (pain).   Yes [provider]  ELIQUIS  5 MG TABS tablet TAKE 1 TABLET BY MOUTH TWICE A DAY 08/17/23  Yes Bensimhon, Toribio SAUNDERS, MD  esomeprazole  (NEXIUM ) 40 MG capsule Take 40 mg by mouth 2 (two) times daily. 01/08/22  Yes [provider]  JARDIANCE   10 MG TABS tablet TAKE 1 TABLET BY MOUTH EVERY DAY BEFORE BREAKFAST 03/31/23  Yes Bensimhon, Toribio SAUNDERS, MD  levothyroxine  (SYNTHROID ) 50 MCG tablet TAKE 1 TABLET (50 MCG TOTAL) BY MOUTH DAILY BEFORE BREAKFAST. TAKE 1 HOUR BEFORE BREAKFAST 10/17/23  Yes Milford, Farmers, FNP  metolazone  (ZAROXOLYN ) 5 MG tablet Take 5 mg by mouth once a week. 11/04/23  Yes [provider]  mexiletine (MEXITIL ) 150 MG capsule Take 1 capsule (150 mg total) by mouth every 12 (twelve) hours. 08/09/23  Yes Milford, Harlene HERO, FNP  polyethylene glycol powder (GLYCOLAX /MIRALAX ) 17 GM/SCOOP powder Take 1 CAPFUL (17 g) by mouth daily as needed. 10/24/23  Yes Fairy Frames, MD  potassium chloride  SA (KLOR-CON  M) 20 MEQ tablet Take 1-2 tablets (20-40 mEq total) by mouth 2 (two) times daily. PLEASE TAKE 40 mEq (2 tabs in the morning)  and 20 mEq (1 tab in the afternoon/evening) 10/24/23  Yes Fairy Frames, MD  sodium chloride  (OCEAN) 0.65 % nasal spray Place 2 sprays into the nose as needed for congestion.   Yes [provider]  torsemide  (DEMADEX ) 20 MG tablet Take 4 tablets (80 mg) in the morning and 2 tablets (40 mg)in the afternoon 10/24/23  Yes Fairy Frames, MD  spironolactone  (ALDACTONE ) 25 MG tablet Take 25 mg by mouth daily. Patient not taking: Reported on 11/21/2023 11/05/23   [provider]  UNABLE TO FIND  Referral to [facility] -Please accept this patient as a referral for CKD 4 and diastolic CHF  Sigurd Pac MD [facility] 10/24/23   Pac Sigurd, MD    Past Medical History: Past Medical History:  Diagnosis Date   Atrial flutter [facility])    a. s/p ablation 03/2018.   Chronic combined systolic and diastolic CHF (congestive heart failure) (HCC)    History of esophagogastroduodenoscopy (EGD)    Morbid obesity (HCC)    NICM (nonischemic cardiomyopathy) (HCC)    OSA on CPAP    mild OSA with an AHI of 7.7/hr on auto CPAP   PVC's (premature ventricular  contractions)     Past Surgical History: Past Surgical History:  Procedure Laterality Date   A-FLUTTER ABLATION N/A 04/06/2018   Procedure: A-FLUTTER ABLATION;  Surgeon: Waddell Danelle ORN, MD;  [facility]: [facility] INVASIVE CV LAB;  Service: Cardiovascular;  Laterality: N/A;   BIOPSY  07/11/2023   Procedure: BIOPSY;  Surgeon: Elicia Claw, MD;  Location: [facility] ENDOSCOPY;  Service: Gastroenterology;;   ESOPHAGEAL BRUSHING  06/19/2020   Procedure: ESOPHAGEAL BRUSHING;  Surgeon: Dianna Specking, MD;  Location: [facility] ENDOSCOPY;  Service: Endoscopy;;   ESOPHAGOGASTRODUODENOSCOPY (EGD) WITH PROPOFOL  Left 06/19/2020   Procedure: ESOPHAGOGASTRODUODENOSCOPY (EGD) WITH PROPOFOL ;  Surgeon: Dianna Specking, MD;  Location: [facility] ENDOSCOPY;  Service: Endoscopy;  Laterality: Left;   ESOPHAGOGASTRODUODENOSCOPY (EGD) WITH PROPOFOL  N/A 07/11/2023   Procedure: ESOPHAGOGASTRODUODENOSCOPY (EGD) WITH PROPOFOL ;  Surgeon: Elicia Claw, MD;  Location: [facility] ENDOSCOPY;  Service: Gastroenterology;  Laterality: N/A;   ICD IMPLANT N/A 02/22/2022   Procedure: ICD IMPLANT;  Surgeon: Waddell Danelle ORN, MD;  Location: [facility] INVASIVE CV LAB;  Service: Cardiovascular;  Laterality: N/A;   RIGHT HEART CATH N/A 12/20/2019   Procedure: RIGHT HEART CATH;  Surgeon: Cherrie Toribio SAUNDERS, MD;  Location: [facility] INVASIVE CV LAB;  Service: Cardiovascular;  Laterality: N/A;   RIGHT/LEFT HEART CATH AND CORONARY ANGIOGRAPHY N/A 03/01/2017   Procedure: Right/Left Heart Cath and Coronary Angiography;  Surgeon: Toribio SAUNDERS Cherrie, MD;  Location: [facility] INVASIVE CV LAB;  Service: Cardiovascular;  Laterality: N/A;    Family History: Family History  Problem Relation Age of Onset   CVA Mother        pacemaker   Multiple sclerosis Mother    Seizures Mother    Hypertension Father    Gout Father     Social History: Social History   Socioeconomic History   Marital status: Single    Spouse name: Not on file   Number of children: Not on file   Years of  education: Not on file   Highest education level: Not on file  Occupational History   Not on file  Tobacco Use   Smoking status: Never   Smokeless tobacco: Never  Vaping Use   Vaping status: Never Used  Substance and Sexual Activity   Alcohol use: Yes    Comment: rarely   Drug use: No   Sexual activity: Not Currently    Partners: Female  Other Topics Concern   Not on file  Social History Narrative   Not on file   Social Drivers of Health   Financial Resource Strain: Not on file  Food Insecurity: No Food Insecurity (09/30/2023)   Hunger Vital Sign    Worried About Running Out of Food in the Last Year: Never true    Ran Out of Food in the Last Year: Never true  Transportation Needs: No Transportation Needs (09/30/2023)   PRAPARE - Transportation    Lack  of Transportation (Medical): No    Lack of Transportation (Non-Medical): No  Physical Activity: Not on file  Stress: Not on file  Social Connections: Not on file    Allergies:  Allergies  Allergen Reactions   Entresto  [Sacubitril -Valsartan ] Nausea And Vomiting and Other (See Comments)    Lightheaded, fainting    Objective:    Vital Signs:   Temp:  [97.5 F (36.4 C)-98.9 F (37.2 C)] 97.7 F (36.5 C) (01/08 1412) Pulse Rate:  [61-95] 74 (01/08 1500) Resp:  [12-35] 18 (01/08 1539) BP: (89-108)/(55-84) 106/82 (01/08 1500) SpO2:  [91 %-100 %] 100 % (01/08 1500) Weight:  [112.6 kg] 112.6 kg (01/08 1500)    Weight change: Filed Weights   11/22/23 1500  Weight: 112.6 kg    Intake/Output:  No intake or output data in the 24 hours ending 11/22/23 1549    Physical Exam    General:  Sitting on the side of the bed.  No resp difficulty HEENT: normal Neck: supple. JVP 8-9 . Carotids 2+ bilat; no bruits. No lymphadenopathy or thyromegaly appreciated. Cor: PMI nondisplaced. Irregular rate & rhythm. No rubs, gallops or murmurs. Lungs: clear Abdomen: soft, nontender, nondistended. No hepatosplenomegaly. No bruits  or masses. Good bowel sounds. Extremities: no cyanosis, clubbing, rash, trace edema Neuro: alert & orientedx3, cranial nerves grossly intact. moves all 4 extremities w/o difficulty. Affect pleasant   Telemetry  A flutter 70-80   EKG   A Flutter 70s    Labs   Basic Metabolic Panel: Recent Labs  Lab 11/21/23 1259 11/21/23 2328 11/22/23 0637  NA 134* 133* 131*  K 2.5* 3.0* 2.7*  CL 93* 92* 92*  CO2 27 29 27   GLUCOSE 110* 109* 100*  BUN 30* 32* 36*  CREATININE 2.05* 1.98* 2.07*  CALCIUM  8.9 8.8* 8.7*  MG 1.8  --  2.2  PHOS  --   --  3.5    Liver Function Tests: Recent Labs  Lab 11/21/23 1259 11/22/23 0637  AST 36 28  ALT 26 25  ALKPHOS 107 103  BILITOT 3.3* 2.8*  PROT 6.0* 5.6*  ALBUMIN 2.8* 2.5*   No results for input(s): LIPASE, AMYLASE in the last 168 hours. No results for input(s): AMMONIA in the last 168 hours.  CBC: Recent Labs  Lab 11/21/23 1259 11/22/23 0637  WBC 7.5 5.8  NEUTROABS 5.9 4.4  HGB 14.5 14.0  HCT 45.0 43.8  MCV 79.9* 79.3*  PLT 266 248    Cardiac Enzymes: No results for input(s): CKTOTAL, CKMB, CKMBINDEX, TROPONINI in the last 168 hours.  BNP: BNP (last 3 results) Recent Labs    09/30/23 0513 10/13/23 0300 11/22/23 0637  BNP 222.1* 285.0* 292.3*    ProBNP (last 3 results) No results for input(s): PROBNP in the last 8760 hours.   CBG: Recent Labs  Lab 11/21/23 1308  GLUCAP 121*    Coagulation Studies: No results for input(s): LABPROT, INR in the last 72 hours.   Imaging   No results found.   Medications:     Current Medications:  amiodarone   200 mg Oral Daily   apixaban   5 mg Oral BID   diclofenac  Sodium  2 g Topical QID   feeding supplement  237 mL Oral BID BM   levothyroxine   50 mcg Oral QAC breakfast   mexiletine  150 mg Oral Q12H   sodium chloride  flush  3 mL Intravenous Q12H   spironolactone   25 mg Oral Daily    Infusions:  sodium chloride   Patient Profile    Bradley Errington is a 43 year old LMNA cardiomyopathy, VT, CKD stage IIIb, permanent AF, and obesity.   Admitted with VT storm in the setting of electrolyte imbalance.  Assessment/Plan   1. VT Storm  ATP x6 with appropriate shock  K and Mag low on admit.  Electrolytes are being replaced.  On amio 200 mg daily + mexitil  150 mg twice a day  Per EP.   2. Chronic HFrEF, NICM, LMNA 10/2023  Echo -EF 35-40%  Volume status does not look to bad. Holding diuretics until K comes up.  Agree with resuming diuetics prior to d/c  Continue spiro and will increase to twice a day.   3.Hypokalemia K 2.5 on admit. K 2.7 today.  Aggressively replace potassium 40 meq every 4 hours.  Increase spironolactone  25 mg twice a day.  Check BMET  4. CKD Stage IIIB Creatinine baseline ~ 2-2.3.  Follow BMET   5. Permanent AF/AFL On eliquis  5 mg twice a day.   Imperative he doesn't miss medications and HF follow up.  Length of Stay: 1  Yulianna Folse, NP  11/22/2023, 3:49 PM  Advanced Heart Failure Team Pager [phone] (M-F; 7a - 5p)  Please contact [facility] for night-coverage after hours (4p -7a ) and weekends on [email or web address]

## 2023-11-22 NOTE — ED Notes (Signed)
 Pacemaker interrogation uploaded in the chart.

## 2023-11-22 NOTE — Consult Note (Addendum)
 ELECTROPHYSIOLOGY CONSULT NOTE    Patient ID: DEMICHAEL TRAUM MRN: [id number], DOB/AGE: 1981/06/05 43 y.o.  Admit date: 11/21/2023 Date of Consult: 11/22/2023  Primary Physician: Rexanne Ingle, MD Primary Cardiologist: None  Electrophysiologist: Dr. Waddell   Referring Provider: Dr. Darci  Patient Profile: Bradley Powell is a 43 y.o. male with a history of HFrEF 2/2 infiltrative cardiomyopathy (LMNA cardiomyopathy), ventricular tachycardia, s/p ICD, atrial flutter s/p CTI ablation in 2019, chronic atrial fibrillation, CKD, obesity, OSA, hypertension, & gout   who is being seen today for the evaluation of VT at the request of Dr. Darci.  HPI:  Bradley Powell is a 43 y.o. male who presented to [facility] ER on 1/7 with VT storm.    The patient was at home when events occurred. He had eaten dinner with his family ~ 2h prior and was in the restroom having a bowel movement. He developed nausea/vomiting, shortness of breath and passed out.  After waking the patient reported some lightheadedness and dizziness but resolved.  He reported on admission that he was uncertain if he had taken his medicines that morning.  He had a recent episode of vomiting after eating at a restaurant after potentially consuming more salty food than normal.  He was notified by the device clinic of VT episode and instructed to have someone drive him to the emergency room.  On presentation to the ER, he was noted to have electrolyte disturbances wit potassium of 2.5, NA 134, Mg+ 2.8.  Additionally noted to have a creatinine of 2.05/BUN 30, albumin 2.8, WBC 7.5, hemoglobin 14.5, and platelets 266.  CXR showed cardiomegaly, ICD in good position, cardiomegaly but no overt edema.  He reports he does not routinely drink ETOH but had a glass of champagne at Christmas & New Years Eve. He was unaware he was shocked. Reports he missed one day of medications but this is abnormal.   He denies chest pain, palpitations,  dyspnea, PND, orthopnea, nausea, vomiting, dizziness, syncope, edema, weight gain, or early satiety.   Labs Potassium2.7* (01/08 9362) Magnesium   2.2 (01/08 9362) Creatinine, ser  2.07* (01/08 9362) PLT  248 (01/08 9362) HGB  14.0 (01/08 0637) WBC 5.8 (01/08 9362)  .    Past Medical History:  Diagnosis Date   Atrial flutter (HCC)    a. s/p ablation 03/2018.   Chronic combined systolic and diastolic CHF (congestive heart failure) (HCC)    History of esophagogastroduodenoscopy (EGD)    Morbid obesity (HCC)    NICM (nonischemic cardiomyopathy) (HCC)    OSA on CPAP    mild OSA with an AHI of 7.7/hr on auto CPAP   PVC's (premature ventricular contractions)      Surgical History:  Past Surgical History:  Procedure Laterality Date   A-FLUTTER ABLATION N/A 04/06/2018   Procedure: A-FLUTTER ABLATION;  Surgeon: Waddell Danelle ORN, MD;  Location: [facility] INVASIVE CV LAB;  Service: Cardiovascular;  Laterality: N/A;   BIOPSY  07/11/2023   Procedure: BIOPSY;  Surgeon: Elicia Claw, MD;  Location: [facility] ENDOSCOPY;  Service: Gastroenterology;;   ESOPHAGEAL BRUSHING  06/19/2020   Procedure: ESOPHAGEAL BRUSHING;  Surgeon: Dianna Specking, MD;  Location: [facility] ENDOSCOPY;  Service: Endoscopy;;   ESOPHAGOGASTRODUODENOSCOPY (EGD) WITH PROPOFOL  Left 06/19/2020   Procedure: ESOPHAGOGASTRODUODENOSCOPY (EGD) WITH PROPOFOL ;  Surgeon: Dianna Specking, MD;  Location: [facility] ENDOSCOPY;  Service: Endoscopy;  Laterality: Left;   ESOPHAGOGASTRODUODENOSCOPY (EGD) WITH PROPOFOL  N/A 07/11/2023   Procedure: ESOPHAGOGASTRODUODENOSCOPY (EGD) WITH PROPOFOL ;  Surgeon: Elicia Claw, MD;  Location: [facility]  ENDOSCOPY;  Service: Gastroenterology;  Laterality: N/A;   ICD IMPLANT N/A 02/22/2022   Procedure: ICD IMPLANT;  Surgeon: Waddell Danelle ORN, MD;  Location: [facility] INVASIVE CV LAB;  Service: Cardiovascular;  Laterality: N/A;   RIGHT HEART CATH N/A 12/20/2019   Procedure: RIGHT HEART CATH;  Surgeon: Cherrie Toribio SAUNDERS, MD;  Location:  [facility] INVASIVE CV LAB;  Service: Cardiovascular;  Laterality: N/A;   RIGHT/LEFT HEART CATH AND CORONARY ANGIOGRAPHY N/A 03/01/2017   Procedure: Right/Left Heart Cath and Coronary Angiography;  Surgeon: Toribio SAUNDERS Cherrie, MD;  Location: [facility] INVASIVE CV LAB;  Service: Cardiovascular;  Laterality: N/A;     (Not in a hospital admission)   Inpatient Medications:   amiodarone   200 mg Oral Daily   apixaban   5 mg Oral BID   diclofenac  Sodium  2 g Topical QID   levothyroxine   50 mcg Oral QAC breakfast   sodium chloride  flush  3 mL Intravenous Q12H   spironolactone   25 mg Oral Daily    Allergies:  Allergies  Allergen Reactions   Entresto  [Sacubitril -Valsartan ] Nausea And Vomiting and Other (See Comments)    Lightheaded, fainting    Family History  Problem Relation Age of Onset   CVA Mother        pacemaker   Multiple sclerosis Mother    Seizures Mother    Hypertension Father    Gout Father      Physical Exam: Vitals:   11/22/23 0930 11/22/23 0948 11/22/23 1000 11/22/23 1030  BP: 98/67  91/74 108/81  Pulse: 70  71 75  Resp: (!) 35  20 (!) 22  Temp:  98.6 F (37 C)    TempSrc:      SpO2: 97%  100% 100%    GEN- NAD, A&O x 3, normal affect HEENT: Normocephalic, atraumatic Lungs- CTAB, Normal effort.  Heart- Regular rate and rhythm, ? TV regurgitation murmur vs rub GI- Soft, NT, ND.  Extremities- No clubbing, cyanosis, or edema   Radiology/Studies: DG Chest 1 View Result Date: 11/21/2023 CLINICAL DATA:  Near syncopal episode with defibrillator firing. EXAM: CHEST  1 VIEW COMPARISON:  Chest radiograph dated 10/09/2023 FINDINGS: Lines/tubes: Left chest wall ICD lead projects over the right ventricle. Lungs: Well inflated lungs. No focal consolidation. Pleura: No pneumothorax or pleural effusion. Heart/mediastinum: Similar mildly enlarged cardiomediastinal silhouette. Bones: No acute osseous abnormality. IMPRESSION: 1.  No focal consolidations. 2. Similar mild cardiomegaly.  Electronically Signed   By: Limin  Xu M.D.   On: 11/21/2023 13:35    EKG: 1/7 EKG > coarse AF, ventricular rate 73 bpm (personally reviewed)  TELEMETRY: SR 70-80's with occ PVC's (personally reviewed)  STUDIES:  ECHO 10/16/23 >LVEF 35-40%, moderate TV regurgitation  DEVICE HISTORY:  [employer] ICD, implanted 02/22/22     Assessment/Plan:  VT Storm In setting of severe hypokalemia  -tele monitoring  -continue amiodarone  200mg  every day > plan for short term increase in amiodarone  once electrolytes stable -continue mexiletine 150mg  BID  -repeat BMP at 1430  Genetic Familial Laminopathy / HFrEF  LVEF 35-40% in 10/2023  -hold metolazone , torsemide  until K+ stable  -resume diuretics once K+ stable > will need to be on stable regimen before discharge -continue spironolactone      Atrial Flutter / Coarse AF  -continue amiodarone   -OAC for stroke prophylaxis  Secondary Hypercoagulable State  -continue Eliqus 5mg  BID, dose reviewed and appropriate by wt/age   CKD with Electrolyte Disturbance: Hypokalemia  -Trend BMP / urinary output -Replace electrolytes as indicated -  Avoid nephrotoxic agents, ensure adequate renal perfusion  Protein Calorie Malnutrition  -ensure TID   Hepatic Cirrhosis  Elevated Bilirubin  -chronically elevated, dating back to at least 2018  Hypothyroidism  -continue synthroid    Given medical issues, will ask [facility] to pick up in am as primary service. EP will continue to consult.     For questions or updates, please contact [facility] Please consult [email or web address] for contact info under Cardiology/STEMI.  Signed, Daphne Barrack, MSN, APRN, NP-C, AGACNP-BC [facility] - Electrophysiology  11/22/2023, 1:17 PM

## 2023-11-22 NOTE — Progress Notes (Signed)
 Heart Failure Navigator Progress Note  Assessed for Heart & Vascular TOC clinic readiness.  Patient does not meet criteria due to Advanced Heart Failure Team patient of Dr. [name]. .   Navigator will sign off at this time.   Rhae Hammock, BSN, Scientist, clinical (histocompatibility and immunogenetics) Only

## 2023-11-23 DIAGNOSIS — I5022 Chronic systolic (congestive) heart failure: Secondary | ICD-10-CM | POA: Diagnosis not present

## 2023-11-23 DIAGNOSIS — I472 Ventricular tachycardia, unspecified: Secondary | ICD-10-CM | POA: Diagnosis not present

## 2023-11-23 LAB — COMPREHENSIVE METABOLIC PANEL
ALT: 31 U/L (ref 0–44)
AST: 38 U/L (ref 15–41)
Albumin: 3 g/dL — ABNORMAL LOW (ref 3.5–5.0)
Alkaline Phosphatase: 132 U/L — ABNORMAL HIGH (ref 38–126)
Anion gap: 11 (ref 5–15)
BUN: 32 mg/dL — ABNORMAL HIGH (ref 6–20)
CO2: 29 mmol/L (ref 22–32)
Calcium: 9.1 mg/dL (ref 8.9–10.3)
Chloride: 93 mmol/L — ABNORMAL LOW (ref 98–111)
Creatinine, Ser: 1.87 mg/dL — ABNORMAL HIGH (ref 0.61–1.24)
GFR, Estimated: 46 mL/min — ABNORMAL LOW (ref >60)
Glucose, Bld: 122 mg/dL — ABNORMAL HIGH (ref 70–99)
Potassium: 2.8 mmol/L — ABNORMAL LOW (ref 3.5–5.1)
Sodium: 133 mmol/L — ABNORMAL LOW (ref 135–145)
Total Bilirubin: 2.7 mg/dL — ABNORMAL HIGH (ref 0.0–1.2)
Total Protein: 6.6 g/dL (ref 6.5–8.1)

## 2023-11-23 LAB — CBC WITH DIFFERENTIAL/PLATELET
Abs Immature Granulocytes: 0.03 10*3/uL (ref 0.00–0.07)
Basophils Absolute: 0.1 10*3/uL (ref 0.0–0.1)
Basophils Relative: 1 %
Eosinophils Absolute: 0.1 10*3/uL (ref 0.0–0.5)
Eosinophils Relative: 1 %
HCT: 49.3 % (ref 39.0–52.0)
Hemoglobin: 15.6 g/dL (ref 13.0–17.0)
Immature Granulocytes: 0 %
Lymphocytes Relative: 10 %
Lymphs Abs: 0.7 10*3/uL (ref 0.7–4.0)
MCH: 25.3 pg — ABNORMAL LOW (ref 26.0–34.0)
MCHC: 31.6 g/dL (ref 30.0–36.0)
MCV: 80 fL (ref 80.0–100.0)
Monocytes Absolute: 0.9 10*3/uL (ref 0.1–1.0)
Monocytes Relative: 12 %
Neutro Abs: 5.5 10*3/uL (ref 1.7–7.7)
Neutrophils Relative %: 76 %
Platelets: 299 10*3/uL (ref 150–400)
RBC: 6.16 MIL/uL — ABNORMAL HIGH (ref 4.22–5.81)
RDW: 17.7 % — ABNORMAL HIGH (ref 11.5–15.5)
WBC: 7.2 10*3/uL (ref 4.0–10.5)
nRBC: 0 % (ref 0.0–0.2)

## 2023-11-23 LAB — BASIC METABOLIC PANEL
Anion gap: 8 (ref 5–15)
BUN: 34 mg/dL — ABNORMAL HIGH (ref 6–20)
CO2: 23 mmol/L (ref 22–32)
Calcium: 8.7 mg/dL — ABNORMAL LOW (ref 8.9–10.3)
Chloride: 99 mmol/L (ref 98–111)
Creatinine, Ser: 1.66 mg/dL — ABNORMAL HIGH (ref 0.61–1.24)
GFR, Estimated: 53 mL/min — ABNORMAL LOW (ref >60)
Glucose, Bld: 114 mg/dL — ABNORMAL HIGH (ref 70–99)
Potassium: 4 mmol/L (ref 3.5–5.1)
Sodium: 130 mmol/L — ABNORMAL LOW (ref 135–145)

## 2023-11-23 LAB — BRAIN NATRIURETIC PEPTIDE: B Natriuretic Peptide: 300.3 pg/mL — ABNORMAL HIGH (ref 0.0–100.0)

## 2023-11-23 LAB — MAGNESIUM: Magnesium: 2.3 mg/dL (ref 1.7–2.4)

## 2023-11-23 LAB — PHOSPHORUS: Phosphorus: 3.2 mg/dL (ref 2.5–4.6)

## 2023-11-23 MED ORDER — SPIRONOLACTONE 25 MG PO TABS
25.0000 mg | ORAL_TABLET | Freq: Two times a day (BID) | ORAL | Status: AC
  Administered 2023-11-23: 25 mg via ORAL
  Filled 2023-11-23: qty 1

## 2023-11-23 MED ORDER — EMPAGLIFLOZIN 10 MG PO TABS
10.0000 mg | ORAL_TABLET | Freq: Every day | ORAL | Status: DC
  Administered 2023-11-23 – 2023-11-30 (×8): 10 mg via ORAL
  Filled 2023-11-23 (×8): qty 1

## 2023-11-23 MED ORDER — SPIRONOLACTONE 25 MG PO TABS
50.0000 mg | ORAL_TABLET | Freq: Every day | ORAL | Status: DC
  Administered 2023-11-24 – 2023-11-30 (×7): 50 mg via ORAL
  Filled 2023-11-23 (×7): qty 2

## 2023-11-23 MED ORDER — POTASSIUM CHLORIDE CRYS ER 20 MEQ PO TBCR
40.0000 meq | EXTENDED_RELEASE_TABLET | Freq: Three times a day (TID) | ORAL | Status: DC
  Administered 2023-11-23 (×2): 40 meq via ORAL
  Filled 2023-11-23 (×2): qty 2

## 2023-11-23 MED ORDER — PANTOPRAZOLE SODIUM 40 MG PO TBEC
40.0000 mg | DELAYED_RELEASE_TABLET | Freq: Two times a day (BID) | ORAL | Status: DC
  Administered 2023-11-23 – 2023-11-30 (×15): 40 mg via ORAL
  Filled 2023-11-23 (×15): qty 1

## 2023-11-23 MED ORDER — POTASSIUM CHLORIDE 10 MEQ/100ML IV SOLN
10.0000 meq | INTRAVENOUS | Status: AC
  Administered 2023-11-23 (×3): 10 meq via INTRAVENOUS
  Filled 2023-11-23 (×3): qty 100

## 2023-11-23 MED ORDER — FUROSEMIDE 10 MG/ML IJ SOLN
160.0000 mg | Freq: Two times a day (BID) | INTRAVENOUS | Status: DC
  Administered 2023-11-23 – 2023-11-25 (×4): 160 mg via INTRAVENOUS
  Filled 2023-11-23: qty 16
  Filled 2023-11-23 (×3): qty 10
  Filled 2023-11-23: qty 16

## 2023-11-23 MED ORDER — POTASSIUM CHLORIDE CRYS ER 20 MEQ PO TBCR
40.0000 meq | EXTENDED_RELEASE_TABLET | ORAL | Status: AC
  Administered 2023-11-23 (×2): 40 meq via ORAL
  Filled 2023-11-23 (×2): qty 2

## 2023-11-23 NOTE — TOC Progression Note (Signed)
 Transition of Care [facility]) - Progression Note    Patient Details  Name: Bradley Powell MRN: [id number] Date of Birth: Mar 01, 1981  Transition of Care [facility]) CM/SW Contact  Arlana JINNY Nicholaus [place] Phone Number: [phone] 11/23/2023, 1:06 PM  Clinical Narrative:   HF CSW met with pt at bedside. Pt stated that he has been doing well until current medical scare. Pt stated that he has not returned back to work yet, although he desires to. Pt stated that he was looking forward to dc tomorrow but understood that if he needs to stay longer that's ok. Pt stated at dc his family will transport him home. CSW called to schedule pts hospital follow up appt. For Thursday, November 30, 2023 at 10 AM.   TOC will continue following.     Expected Discharge Plan: Home/Self Care Barriers to Discharge: Continued Medical Work up  Expected Discharge Plan and Services   Discharge Planning Services: CM Consult   Living arrangements for the past 2 months: Single Family Home                                       Social Determinants of Health (SDOH) Interventions SDOH Screenings   Food Insecurity: No Food Insecurity (11/22/2023)  Housing: Low Risk  (11/22/2023)  Transportation Needs: No Transportation Needs (11/22/2023)  Utilities: Not At Risk (11/22/2023)  Social Connections: Socially Isolated (11/22/2023)  Tobacco Use: Low Risk  (11/22/2023)    Readmission Risk Interventions     No data to display

## 2023-11-23 NOTE — Progress Notes (Signed)
 Advanced Heart Failure Rounding Note  HF Cardiologist: Dr. Cherrie  Chief Complaint: VT storm  Subjective:    Feeling well. No dyspnea. UP ambulating around the room.   Objective:   Weight Range: 113.1 kg Body mass index is 37.92 kg/m.   Vital Signs:   Temp:  [97.6 F (36.4 C)-98.6 F (37 C)] 98 F (36.7 C) (01/09 0802) Pulse Rate:  [64-78] 75 (01/09 0802) Resp:  [12-35] 20 (01/09 0802) BP: (90-111)/(64-88) 111/88 (01/09 0802) SpO2:  [93 %-100 %] 100 % (01/09 0802) Weight:  [112.6 kg-113.1 kg] 113.1 kg (01/09 0442) Last BM Date : 11/22/23  Weight change: Filed Weights   11/22/23 1500 11/23/23 0442  Weight: 112.6 kg 113.1 kg    Intake/Output:   Intake/Output Summary (Last 24 hours) at 11/23/2023 0804 Last data filed at 11/23/2023 0600 Gross per 24 hour  Intake 125 ml  Output --  Net 125 ml      Physical Exam    General:  Well appearing. Sitting on side of bed. HEENT: Normal Neck: Supple. JVP to jaw.  Cor: PMI nondisplaced. Regular rate & rhythm. No rubs, gallops or murmurs. Lungs: Clear Abdomen: Soft, nontender, nondistended.  Extremities: No cyanosis, clubbing, rash, 1+ edema Neuro: Alert & orientedx3. Affect pleasant   Telemetry   AFL 70s  EKG    No new  Labs    CBC Recent Labs    11/22/23 0637 11/23/23 0252  WBC 5.8 7.2  NEUTROABS 4.4 5.5  HGB 14.0 15.6  HCT 43.8 49.3  MCV 79.3* 80.0  PLT 248 299   Basic Metabolic Panel Recent Labs    98/91/74 0637 11/22/23 1746 11/23/23 0252  NA 131* 132* 133*  K 2.7* 3.2* 2.8*  CL 92* 93* 93*  CO2 27 25 29   GLUCOSE 100* 127* 122*  BUN 36* 39* 32*  CREATININE 2.07* 1.95* 1.87*  CALCIUM  8.7* 9.2 9.1  MG 2.2  --  2.3  PHOS 3.5  --  3.2   Liver Function Tests Recent Labs    11/22/23 0637 11/23/23 0252  AST 28 38  ALT 25 31  ALKPHOS 103 132*  BILITOT 2.8* 2.7*  PROT 5.6* 6.6  ALBUMIN 2.5* 3.0*   No results for input(s): LIPASE, AMYLASE in the last 72 hours. Cardiac  Enzymes No results for input(s): CKTOTAL, CKMB, CKMBINDEX, TROPONINI in the last 72 hours.  BNP: BNP (last 3 results) Recent Labs    10/13/23 0300 11/22/23 0637 11/23/23 0252  BNP 285.0* 292.3* 300.3*    ProBNP (last 3 results) No results for input(s): PROBNP in the last 8760 hours.   D-Dimer No results for input(s): DDIMER in the last 72 hours. Hemoglobin A1C No results for input(s): HGBA1C in the last 72 hours. Fasting Lipid Panel No results for input(s): CHOL, HDL, LDLCALC, TRIG, CHOLHDL, LDLDIRECT in the last 72 hours. Thyroid  Function Tests Recent Labs    11/21/23 1259  TSH 21.175*    Other results:   Imaging    No results found.   Medications:     Scheduled Medications:  amiodarone   200 mg Oral Daily   apixaban   5 mg Oral BID   diclofenac  Sodium  2 g Topical QID   feeding supplement  237 mL Oral BID BM   levothyroxine   50 mcg Oral QAC breakfast   mexiletine  150 mg Oral Q12H   potassium chloride   40 mEq Oral BID   potassium chloride  SA  40 mEq Oral Q4H   sodium  chloride flush  3 mL Intravenous Q12H   spironolactone   25 mg Oral BID    Infusions:  potassium chloride  10 mEq (11/23/23 0742)    PRN Medications: acetaminophen , sodium chloride  flush    Patient Profile  Mr Klem is a 43 year old w/ LMNA cardiomyopathy/chronic HFrEF, VT, CKD stage IIIb, permanent AF, and obesity.    Admitted with VT storm in the setting of electrolyte imbalance.   Assessment/Plan  1. VT Storm  -S/p ATP x6 with appropriate shock on 11/20/23 -In setting of severe hypokalemia. K and Mag low on admit.  -K 2.8 this am. Continue to supp aggressively today. Recheck BMET this afternoon. -Mag 2.3 -On amio 200 mg daily + mexitil  150 mg twice a day. EP planning to reload amiodarone  once K repleted   2. Chronic HFrEF, NICM, LMNA -10/2023  Echo EF 35-40%  -Volume overloaded.  -Holding diuretics until K improves. Will need to resume diuretics  prior to discharge. -Spiro has been increased to 25 mg BID. -Unable to tolerate Entresto . Had AKI and vomiting with losartan  in the past. -Eventually add back Jardiance .   3.Hypokalemia -K 2.5 on admit. K 8 today.  -Aggressive K supp ordered. BMET this afternoon. -Continue increased dose of Spiro   4. CKD Stage IIIB -Creatinine baseline ~ 2-2.3.  -Scr 1.87 today   5. Permanent AF/AFL -On eliquis  5 mg twice a day.  -Rate controlled   Length of Stay: 2  Afra Tricarico N, PA-C  11/23/2023, 8:04 AM  Advanced Heart Failure Team Pager [phone] (M-F; 7a - 5p)  Please contact [facility] for night-coverage after hours (5p -7a ) and weekends on [email or web address]

## 2023-11-23 NOTE — Progress Notes (Signed)
 PROGRESS NOTE    Bradley Powell  FMW:[id number] DOB: September 14, 1981 DOA: 11/21/2023 PCP: Rexanne Ingle, MD    Brief Narrative:  43 year old with history of chronic combined heart failure with known ejection fraction 40%, history of PVCs, AICD, sleep apnea on CPAP history of a flutter status post ablation on Eliquis  and amiodarone , CKD stage IIIb, hypothyroidism, obesity and GERD, high dose of diuretics at home presented with last few weeks of nausea, large vomiting.  He was also notified by [employer] that he had 3 AICD discharges.  Patient did not feel any shocks.  In the emergency room he was found to be hypokalemic.  Admitted with cardiology consultation.  Subjective: Examined patient in the morning rounds.  Denies any complaints.  Denies any chest pain or palpitations.  He was wondering if he can go back on his acid medicine because he has some reflux.  Potassium is again 2.8.  Magnesium  is 2.3. Assessment & Plan:   Ventricular tachycardia status post AICD shocks: This was thought to be electrolyte mediated.  Cardiology following.  Currently stable.  No evidence of arrhythmias in the monitor.  Continues on amiodarone  and mexiletine.  Replacing electrolytes.  Heart failure with reduced ejection fraction, history of nonischemic cardiomyopathy, genetic familial adenopathy: Continues to be on aspirin  lactone.  Followed by heart failure team.  Planning to start on IV diuresis once potassium is improved.  Hypokalemia: Severe and persistent.  Aggressive IV and oral replacement today.  Monitor levels.  Chronic a flutter: Rate controlled.  On amiodarone .  Therapeutic on Eliquis .  CKD stage IIIb: Creatinine at about baseline of 2.  Obesity class II: BMI 37.  Diet and excise.   DVT prophylaxis:  apixaban  (ELIQUIS ) tablet 5 mg   Code Status: Full code Family Communication: None at the bedside Disposition Plan: Status is: Inpatient Remains inpatient appropriate because: Significant  electrolyte abnormality     Consultants:  Cardiology  Procedures:  None  Antimicrobials:  None     Objective: Vitals:   11/23/23 0037 11/23/23 0442 11/23/23 0802 11/23/23 1220  BP: 104/65 99/70 111/88 (!) 110/94  Pulse: 67 64 75 80  Resp: 18 18 20 20   Temp: 97.8 F (36.6 C) 98.2 F (36.8 C) 98 F (36.7 C) 98 F (36.7 C)  TempSrc: Oral Oral Oral Oral  SpO2: 100% 100% 100% 100%  Weight:  113.1 kg    Height:        Intake/Output Summary (Last 24 hours) at 11/23/2023 1412 Last data filed at 11/23/2023 1309 Gross per 24 hour  Intake 245 ml  Output --  Net 245 ml   Filed Weights   11/22/23 1500 11/23/23 0442  Weight: 112.6 kg 113.1 kg    Examination:  General exam: Appears calm and comfortable  Respiratory system: Clear to auscultation. Respiratory effort normal. Cardiovascular system: S1 & S2 heard, RRR.  1+ bilateral pedal edema.  Nontender. Gastrointestinal system: Abdomen is nondistended, soft and nontender. No organomegaly or masses felt. Normal bowel sounds heard. Central nervous system: Alert and oriented. No focal neurological deficits. Extremities: Symmetric 5 x 5 power. Skin: No rashes, lesions or ulcers Psychiatry: Judgement and insight appear normal. Mood & affect appropriate.     Data Reviewed: I have personally reviewed following labs and imaging studies  CBC: Recent Labs  Lab 11/21/23 1259 11/22/23 0637 11/23/23 0252  WBC 7.5 5.8 7.2  NEUTROABS 5.9 4.4 5.5  HGB 14.5 14.0 15.6  HCT 45.0 43.8 49.3  MCV 79.9* 79.3* 80.0  PLT  266 248 299   Basic Metabolic Panel: Recent Labs  Lab 11/21/23 1259 11/21/23 2328 11/22/23 0637 11/22/23 1746 11/23/23 0252  NA 134* 133* 131* 132* 133*  K 2.5* 3.0* 2.7* 3.2* 2.8*  CL 93* 92* 92* 93* 93*  CO2 27 29 27 25 29   GLUCOSE 110* 109* 100* 127* 122*  BUN 30* 32* 36* 39* 32*  CREATININE 2.05* 1.98* 2.07* 1.95* 1.87*  CALCIUM  8.9 8.8* 8.7* 9.2 9.1  MG 1.8  --  2.2  --  2.3  PHOS  --   --  3.5  --   3.2   GFR: Estimated Creatinine Clearance: 64.1 mL/min (A) (by C-G formula based on SCr of 1.87 mg/dL (H)). Liver Function Tests: Recent Labs  Lab 11/21/23 1259 11/22/23 0637 11/23/23 0252  AST 36 28 38  ALT 26 25 31   ALKPHOS 107 103 132*  BILITOT 3.3* 2.8* 2.7*  PROT 6.0* 5.6* 6.6  ALBUMIN 2.8* 2.5* 3.0*   No results for input(s): LIPASE, AMYLASE in the last 168 hours. No results for input(s): AMMONIA in the last 168 hours. Coagulation Profile: No results for input(s): INR, PROTIME in the last 168 hours. Cardiac Enzymes: No results for input(s): CKTOTAL, CKMB, CKMBINDEX, TROPONINI in the last 168 hours. BNP (last 3 results) No results for input(s): PROBNP in the last 8760 hours. HbA1C: No results for input(s): HGBA1C in the last 72 hours. CBG: Recent Labs  Lab 11/21/23 1308  GLUCAP 121*   Lipid Profile: No results for input(s): CHOL, HDL, LDLCALC, TRIG, CHOLHDL, LDLDIRECT in the last 72 hours. Thyroid  Function Tests: Recent Labs    11/21/23 1259  TSH 21.175*  FREET4 0.70   Anemia Panel: No results for input(s): VITAMINB12, FOLATE, FERRITIN, TIBC, IRON, RETICCTPCT in the last 72 hours. Sepsis Labs: No results for input(s): PROCALCITON, LATICACIDVEN in the last 168 hours.  No results found for this or any previous visit (from the past 240 hours).       Radiology Studies: No results found.      Scheduled Meds:  amiodarone   200 mg Oral Daily   apixaban   5 mg Oral BID   diclofenac  Sodium  2 g Topical QID   empagliflozin   10 mg Oral Daily   feeding supplement  237 mL Oral BID BM   levothyroxine   50 mcg Oral QAC breakfast   mexiletine  150 mg Oral Q12H   pantoprazole   40 mg Oral BID   potassium chloride   40 mEq Oral BID   sodium chloride  flush  3 mL Intravenous Q12H   spironolactone   25 mg Oral BID   [START ON 11/24/2023] spironolactone   50 mg Oral Daily   Continuous Infusions:   LOS: 2 days     Time spent: 40 minutes    Renato Applebaum, MD [facility]

## 2023-11-23 NOTE — Plan of Care (Signed)

## 2023-11-23 NOTE — Progress Notes (Signed)
  Patient Name: Bradley Powell Date of Encounter: 11/23/2023  Primary Cardiologist: None Electrophysiologist: Danelle Birmingham, MD  Interval Summary   The patient is doing well today.  No acute events overnight. Reports he is about to go back to the restroom again.   At this time, the patient denies chest pain, shortness of breath, or any new concerns.  Vital Signs    Vitals:   11/22/23 1935 11/23/23 0037 11/23/23 0442 11/23/23 0802  BP: 111/76 104/65 99/70 111/88  Pulse: 69 67 64 75  Resp: 18 18 18 20   Temp: 97.8 F (36.6 C) 97.8 F (36.6 C) 98.2 F (36.8 C) 98 F (36.7 C)  TempSrc: Oral Oral Oral Oral  SpO2: 100% 100% 100% 100%  Weight:   113.1 kg   Height:        Intake/Output Summary (Last 24 hours) at 11/23/2023 0918 Last data filed at 11/23/2023 0600 Gross per 24 hour  Intake 125 ml  Output --  Net 125 ml   Filed Weights   11/22/23 1500 11/23/23 0442  Weight: 112.6 kg 113.1 kg    Physical Exam    GEN- The patient is well appearing, alert and oriented x 3 today.   Lungs- Clear to ausculation bilaterally, normal work of breathing Cardiac- Regular rate and rhythm, no murmurs, rubs or gallops GI- soft, NT, ND, + BS Extremities- no clubbing or cyanosis. No edema  Telemetry    Coarse AF/AFL with ventricular rate 70-80's. No VT.  (personally reviewed)  Hospital Course    ADRIEN SHANKAR is a 43 y.o. male with a history of HFrEF 2/2 infiltrative cardiomyopathy (LMNA cardiomyopathy), ventricular tachycardia, s/p ICD, atrial flutter s/p CTI ablation in 2019, longstanding persistent atrial fibrillation, CKD, obesity, OSA, hypertension, & gout  admitted 1/7 after being notified of ICD shock in setting of VT storm. Found to have profound hypokalemia.   Assessment & Plan    VT  Suspect severe hypokalemia contributing  -continue K+ replacement  -plan for reloading of amiodarone  once potassium corrected  -tele monitoring  -continue mexiletine 150 mg BID  HFrEF  due to LMNA Cardiomyopathy  -volume status per AHF Team  -hold diuresis until K+ replete   Long Standing Persistent AF  AFL s/p CTI Ablation  -tele monitoring  -continue amiodarone   -OAC for stroke prophylaxis   Secondary Hypercoagulable State  -continue Eliquis , dose reviewed and appropriate by wt / age   CKD IIIb Baseline CR ~ 2 -Trend BMP / urinary output -Replace electrolytes as indicated, KCL replaced per pharmacy 1/9 -Avoid nephrotoxic agents, ensure adequate renal perfusion  Cirrhosis  -chronically elevated bilirubin   Moderate Protein Calorie Malnutrition  -ensure  -per primary   Hypothyroidism  -per primary      For questions or updates, please contact [facility] Please consult [email or web address] for contact info under Cardiology/STEMI.  Signed, Daphne Barrack, MSN, APRN, NP-C, AGACNP-BC [facility] - Electrophysiology  11/23/2023, 9:22 AM

## 2023-11-24 DIAGNOSIS — I5022 Chronic systolic (congestive) heart failure: Secondary | ICD-10-CM | POA: Diagnosis not present

## 2023-11-24 DIAGNOSIS — I472 Ventricular tachycardia, unspecified: Secondary | ICD-10-CM | POA: Diagnosis not present

## 2023-11-24 LAB — CUP PACEART REMOTE DEVICE CHECK
Battery Remaining Longevity: 162 mo
Battery Remaining Percentage: 100 %
Brady Statistic RV Percent Paced: 0 %
Date Time Interrogation Session: 20250107233600
HighPow Impedance: 67 Ohm
Implantable Lead Connection Status: 753985
Implantable Lead Implant Date: 20230411
Implantable Lead Location: 753860
Implantable Lead Model: 138
Implantable Lead Serial Number: 305338
Implantable Pulse Generator Implant Date: 20230411
Lead Channel Impedance Value: 447 Ohm
Lead Channel Setting Pacing Amplitude: 3 V
Lead Channel Setting Pacing Pulse Width: 0.8 ms
Lead Channel Setting Sensing Sensitivity: 0.6 mV
Pulse Gen Serial Number: 216862

## 2023-11-24 LAB — CBC WITH DIFFERENTIAL/PLATELET
Abs Immature Granulocytes: 0.04 10*3/uL (ref 0.00–0.07)
Basophils Absolute: 0.1 10*3/uL (ref 0.0–0.1)
Basophils Relative: 1 %
Eosinophils Absolute: 0 10*3/uL (ref 0.0–0.5)
Eosinophils Relative: 1 %
HCT: 49.2 % (ref 39.0–52.0)
Hemoglobin: 15.3 g/dL (ref 13.0–17.0)
Immature Granulocytes: 1 %
Lymphocytes Relative: 10 %
Lymphs Abs: 0.8 10*3/uL (ref 0.7–4.0)
MCH: 25.1 pg — ABNORMAL LOW (ref 26.0–34.0)
MCHC: 31.1 g/dL (ref 30.0–36.0)
MCV: 80.7 fL (ref 80.0–100.0)
Monocytes Absolute: 0.8 10*3/uL (ref 0.1–1.0)
Monocytes Relative: 10 %
Neutro Abs: 6.1 10*3/uL (ref 1.7–7.7)
Neutrophils Relative %: 77 %
Platelets: 285 10*3/uL (ref 150–400)
RBC: 6.1 MIL/uL — ABNORMAL HIGH (ref 4.22–5.81)
RDW: 17.5 % — ABNORMAL HIGH (ref 11.5–15.5)
WBC: 7.9 10*3/uL (ref 4.0–10.5)
nRBC: 0 % (ref 0.0–0.2)

## 2023-11-24 LAB — BASIC METABOLIC PANEL
Anion gap: 11 (ref 5–15)
BUN: 35 mg/dL — ABNORMAL HIGH (ref 6–20)
CO2: 23 mmol/L (ref 22–32)
Calcium: 8.5 mg/dL — ABNORMAL LOW (ref 8.9–10.3)
Chloride: 98 mmol/L (ref 98–111)
Creatinine, Ser: 1.9 mg/dL — ABNORMAL HIGH (ref 0.61–1.24)
GFR, Estimated: 45 mL/min — ABNORMAL LOW (ref >60)
Glucose, Bld: 119 mg/dL — ABNORMAL HIGH (ref 70–99)
Potassium: 3.9 mmol/L (ref 3.5–5.1)
Sodium: 132 mmol/L — ABNORMAL LOW (ref 135–145)

## 2023-11-24 LAB — COMPREHENSIVE METABOLIC PANEL
ALT: 33 U/L (ref 0–44)
AST: 35 U/L (ref 15–41)
Albumin: 2.8 g/dL — ABNORMAL LOW (ref 3.5–5.0)
Alkaline Phosphatase: 129 U/L — ABNORMAL HIGH (ref 38–126)
Anion gap: 13 (ref 5–15)
BUN: 34 mg/dL — ABNORMAL HIGH (ref 6–20)
CO2: 26 mmol/L (ref 22–32)
Calcium: 9 mg/dL (ref 8.9–10.3)
Chloride: 96 mmol/L — ABNORMAL LOW (ref 98–111)
Creatinine, Ser: 1.79 mg/dL — ABNORMAL HIGH (ref 0.61–1.24)
GFR, Estimated: 49 mL/min — ABNORMAL LOW (ref >60)
Glucose, Bld: 123 mg/dL — ABNORMAL HIGH (ref 70–99)
Potassium: 3.4 mmol/L — ABNORMAL LOW (ref 3.5–5.1)
Sodium: 135 mmol/L (ref 135–145)
Total Bilirubin: 2.3 mg/dL — ABNORMAL HIGH (ref 0.0–1.2)
Total Protein: 6.1 g/dL — ABNORMAL LOW (ref 6.5–8.1)

## 2023-11-24 LAB — MAGNESIUM: Magnesium: 2 mg/dL (ref 1.7–2.4)

## 2023-11-24 LAB — PHOSPHORUS: Phosphorus: 3.6 mg/dL (ref 2.5–4.6)

## 2023-11-24 LAB — BRAIN NATRIURETIC PEPTIDE: B Natriuretic Peptide: 291.4 pg/mL — ABNORMAL HIGH (ref 0.0–100.0)

## 2023-11-24 MED ORDER — POTASSIUM CHLORIDE CRYS ER 20 MEQ PO TBCR: 60.0000 meq | EXTENDED_RELEASE_TABLET | Freq: Three times a day (TID) | ORAL | Status: DC

## 2023-11-24 MED ORDER — POTASSIUM CHLORIDE CRYS ER 20 MEQ PO TBCR: 60.0000 meq | EXTENDED_RELEASE_TABLET | Freq: Four times a day (QID) | ORAL | Status: DC

## 2023-11-24 MED ORDER — AMIODARONE HCL 200 MG PO TABS
400.0000 mg | ORAL_TABLET | Freq: Two times a day (BID) | ORAL | Status: DC
  Administered 2023-11-24 – 2023-11-30 (×13): 400 mg via ORAL
  Filled 2023-11-24 (×13): qty 2

## 2023-11-24 MED ORDER — POTASSIUM CHLORIDE CRYS ER 20 MEQ PO TBCR
60.0000 meq | EXTENDED_RELEASE_TABLET | ORAL | Status: AC
  Administered 2023-11-24 (×3): 60 meq via ORAL
  Filled 2023-11-24 (×3): qty 3

## 2023-11-24 MED ORDER — MAGNESIUM SULFATE 2 GM/50ML IV SOLN
2.0000 g | Freq: Once | INTRAVENOUS | Status: AC
  Administered 2023-11-24: 2 g via INTRAVENOUS
  Filled 2023-11-24: qty 50

## 2023-11-24 MED ORDER — POTASSIUM CHLORIDE CRYS ER 20 MEQ PO TBCR: 60.0000 meq | EXTENDED_RELEASE_TABLET | ORAL | Status: DC

## 2023-11-24 MED ORDER — POTASSIUM CHLORIDE CRYS ER 20 MEQ PO TBCR
40.0000 meq | EXTENDED_RELEASE_TABLET | Freq: Three times a day (TID) | ORAL | Status: DC
  Administered 2023-11-24 – 2023-11-25 (×3): 40 meq via ORAL
  Filled 2023-11-24 (×3): qty 2

## 2023-11-24 NOTE — Plan of Care (Signed)
  Problem: Education: Goal: Knowledge of General Education information will improve Description: Including pain rating scale, medication(s)/side effects and non-pharmacologic comfort measures Outcome: Progressing   Problem: Clinical Measurements: Goal: Ability to maintain clinical measurements within normal limits will improve Outcome: Progressing   Problem: Clinical Measurements: Goal: Will remain free from infection Outcome: Progressing   Problem: Clinical Measurements: Goal: Diagnostic test results will improve Outcome: Progressing   Problem: Clinical Measurements: Goal: Cardiovascular complication will be avoided Outcome: Progressing   Problem: Activity: Goal: Risk for activity intolerance will decrease Outcome: Progressing

## 2023-11-24 NOTE — Progress Notes (Signed)
 Advanced Heart Failure Rounding Note  HF Cardiologist: Dr. Cherrie  Chief Complaint: Heart Failure  Subjective:   1/9 Given high dose lasix .   Denies SOB.   Objective:   Weight Range: 111.8 kg Body mass index is 37.48 kg/m.   Vital Signs:   Temp:  [97.9 F (36.6 C)-98 F (36.7 C)] 97.9 F (36.6 C) (01/10 0424) Pulse Rate:  [67-83] 67 (01/10 0424) Resp:  [20] 20 (01/10 0424) BP: (90-124)/(71-94) 90/71 (01/10 0424) SpO2:  [98 %-100 %] 99 % (01/10 0424) Weight:  [111.8 kg] 111.8 kg (01/10 0424) Last BM Date : 11/22/23  Weight change: Filed Weights   11/22/23 1500 11/23/23 0442 11/24/23 0424  Weight: 112.6 kg 113.1 kg 111.8 kg    Intake/Output:   Intake/Output Summary (Last 24 hours) at 11/24/2023 0708 Last data filed at 11/24/2023 0300 Gross per 24 hour  Intake 594 ml  Output 3200 ml  Net -2606 ml      Physical Exam   General:   No resp difficulty HEENT: normal Neck: supple JVP 8-9 . Carotids 2+ bilat; no bruits. No lymphadenopathy or thryomegaly appreciated. Cor: PMI nondisplaced. Regular rate & rhythm. No rubs, gallops or murmurs. Lungs: clear Abdomen: soft, nontender, nondistended. No hepatosplenomegaly. No bruits or masses. Good bowel sounds. Extremities: no cyanosis, clubbing, rash, R and LLE trace-1+ edema Neuro: alert & orientedx3, cranial nerves grossly intact. moves all 4 extremities w/o difficulty. Affect pleasant   Telemetry    A flutter 60-80s   EKG    No new  Labs    CBC Recent Labs    11/23/23 0252 11/24/23 0243  WBC 7.2 7.9  NEUTROABS 5.5 6.1  HGB 15.6 15.3  HCT 49.3 49.2  MCV 80.0 80.7  PLT 299 285   Basic Metabolic Panel Recent Labs    98/90/74 0252 11/23/23 1400 11/24/23 0243  NA 133* 130* 135  K 2.8* 4.0 3.4*  CL 93* 99 96*  CO2 29 23 26   GLUCOSE 122* 114* 123*  BUN 32* 34* 34*  CREATININE 1.87* 1.66* 1.79*  CALCIUM  9.1 8.7* 9.0  MG 2.3  --  2.0  PHOS 3.2  --  3.6   Liver Function Tests Recent Labs     11/23/23 0252 11/24/23 0243  AST 38 35  ALT 31 33  ALKPHOS 132* 129*  BILITOT 2.7* 2.3*  PROT 6.6 6.1*  ALBUMIN 3.0* 2.8*   No results for input(s): LIPASE, AMYLASE in the last 72 hours. Cardiac Enzymes No results for input(s): CKTOTAL, CKMB, CKMBINDEX, TROPONINI in the last 72 hours.  BNP: BNP (last 3 results) Recent Labs    11/22/23 0637 11/23/23 0252 11/24/23 0243  BNP 292.3* 300.3* 291.4*    ProBNP (last 3 results) No results for input(s): PROBNP in the last 8760 hours.   D-Dimer No results for input(s): DDIMER in the last 72 hours. Hemoglobin A1C No results for input(s): HGBA1C in the last 72 hours. Fasting Lipid Panel No results for input(s): CHOL, HDL, LDLCALC, TRIG, CHOLHDL, LDLDIRECT in the last 72 hours. Thyroid  Function Tests Recent Labs    11/21/23 1259  TSH 21.175*    Other results:   Imaging    No results found.   Medications:     Scheduled Medications:  amiodarone   200 mg Oral Daily   apixaban   5 mg Oral BID   diclofenac  Sodium  2 g Topical QID   empagliflozin   10 mg Oral Daily   feeding supplement  237 mL Oral BID  BM   levothyroxine   50 mcg Oral QAC breakfast   mexiletine  150 mg Oral Q12H   pantoprazole   40 mg Oral BID   potassium chloride   40 mEq Oral TID   sodium chloride  flush  3 mL Intravenous Q12H   spironolactone   50 mg Oral Daily    Infusions:  furosemide  160 mg (11/23/23 1757)    PRN Medications: acetaminophen , sodium chloride  flush    Patient Profile  Bradley Powell is a 43 year old w/ LMNA cardiomyopathy/chronic HFrEF, VT, CKD stage IIIb, permanent AF, and obesity.    Admitted with VT storm in the setting of electrolyte imbalance.   Assessment/Plan  1. VT Storm  -S/p ATP x6 with appropriate shock on 11/20/23 -In setting of severe hypokalemia. K and Mag low on admit.  -K 3.4  Mag 2 Supp K and Mag -On amio 200 mg daily + mexitil  150 mg twice a day.  EP planning to reload  amiodarone  once K repleted   2. Chronic HFrEF, NICM, LMNA -10/2023  Echo EF 35-40%  -Volume status improving. Continue IV lasix  today . Anticipate switching to torsemide  80 mg /40 mg tomorrow.  -Continue Spiro 50 mg daily.  -Unable to tolerate Entresto . Had AKI and vomiting with losartan  in the past. -Continue  jardiance  10 mg daily.  - Renal function stablle.    3.Hypokalemia -K 2.5 on admit. K 3.4 today  -Continue Spiro 50 mg daily    4. CKD Stage IIIB -Creatinine baseline ~ 2-2.3.  -Scr 1.8 today   5. Permanent AF/AFL -On eliquis  5 mg twice a day.  -Rate controlled   Length of Stay: 3  Codey Burling, NP  11/24/2023, 7:08 AM  Advanced Heart Failure Team Pager [phone] (M-F; 7a - 5p)  Please contact [facility] for night-coverage after hours (5p -7a ) and weekends on [email or web address]

## 2023-11-24 NOTE — Progress Notes (Signed)
 PROGRESS NOTE    Bradley Powell  FMW:[id number] DOB: [birth year] DOA: 11/21/2023 PCP: Rexanne Ingle, MD    Brief Narrative:  43 year old with history of chronic combined heart failure with known ejection fraction 40%, history of PVCs, AICD, sleep apnea on CPAP history of a flutter status post ablation on Eliquis  and amiodarone , CKD stage IIIb, hypothyroidism, obesity and GERD, high dose of diuretics at home presented with last few weeks of nausea, large vomiting.  He was also notified by [employer] that he had 3 AICD discharges.  Patient did not feel any shocks.  In the emergency room he was found to be hypokalemic.  Admitted with cardiology consultation.  Subjective: Patient seen and examined.  He had an episode of vomiting last night but since then feels better.  Protonix  might have been helping.  No more cardiac events.  Started on diuresis. Currently getting AICD interrogated. Urine output 3200 overnight.   Assessment & Plan:   Ventricular tachycardia status post AICD shocks: This was thought to be electrolyte mediated.  Cardiology following.  Currently stable.  No evidence of arrhythmias in the monitor.  Continues on amiodarone  and mexiletine.  Replacing electrolytes. EP cardiology following. Patient is on amiodarone  and mexiletine.  Therapeutic on Eliquis .  Acute on chronic heart failure with reduced ejection fraction, history of nonischemic cardiomyopathy, genetic familial adenopathy: Continues to be on spironolactone .   Followed by heart failure team.   Back on Jardiance , Lasix  160 mg twice daily, mexiletine, high-dose potassium replacement and Aldactone .  Hypokalemia: Severe and persistent.  Aggressive IV and oral replacement today.  Monitor levels.  Chronic a flutter: Rate controlled.  On amiodarone .  Therapeutic on Eliquis .  CKD stage IIIb: Creatinine at about baseline of 1.8-2.  Will closely monitor on diuretics.  Obesity class II: BMI 37.  Diet and  excise.   DVT prophylaxis:  apixaban  (ELIQUIS ) tablet 5 mg   Code Status: Full code Family Communication: None at the bedside Disposition Plan: Status is: Inpatient Remains inpatient appropriate because: Significant electrolyte abnormality, IV diuresis     Consultants:  Cardiology  Procedures:  None  Antimicrobials:  None     Objective: Vitals:   11/23/23 2001 11/24/23 0014 11/24/23 0424 11/24/23 0747  BP: 124/88 114/84 90/71 104/67  Pulse: 83 68 67 81  Resp: 20 20 20 20   Temp: 97.9 F (36.6 C) 97.9 F (36.6 C) 97.9 F (36.6 C) 98.8 F (37.1 C)  TempSrc: Oral Oral Oral Oral  SpO2: 100% 100% 99% 97%  Weight:   111.8 kg   Height:        Intake/Output Summary (Last 24 hours) at 11/24/2023 0855 Last data filed at 11/24/2023 0748 Gross per 24 hour  Intake 831 ml  Output 3200 ml  Net -2369 ml   Filed Weights   11/22/23 1500 11/23/23 0442 11/24/23 0424  Weight: 112.6 kg 113.1 kg 111.8 kg    Examination:  General exam: Appears calm and comfortable.  Pleasant interaction.  On room air. Respiratory system: Clear to auscultation. Respiratory effort normal. Cardiovascular system: S1 & S2 heard, RRR.  1+ bilateral pedal edema.  Nontender.  AICD present left precordium. Gastrointestinal system: Abdomen is nondistended, soft and nontender. No organomegaly or masses felt. Normal bowel sounds heard. Central nervous system: Alert and oriented. No focal neurological deficits. Extremities: Symmetric 5 x 5 power. Skin: No rashes, lesions or ulcers Psychiatry: Judgement and insight appear normal. Mood & affect appropriate.     Data Reviewed: I have personally reviewed following  labs and imaging studies  CBC: Recent Labs  Lab 11/21/23 1259 11/22/23 0637 11/23/23 0252 11/24/23 0243  WBC 7.5 5.8 7.2 7.9  NEUTROABS 5.9 4.4 5.5 6.1  HGB 14.5 14.0 15.6 15.3  HCT 45.0 43.8 49.3 49.2  MCV 79.9* 79.3* 80.0 80.7  PLT 266 248 299 285   Basic Metabolic Panel: Recent  Labs  Lab 11/21/23 1259 11/21/23 2328 11/22/23 0637 11/22/23 1746 11/23/23 0252 11/23/23 1400 11/24/23 0243  NA 134*   < > 131* 132* 133* 130* 135  K 2.5*   < > 2.7* 3.2* 2.8* 4.0 3.4*  CL 93*   < > 92* 93* 93* 99 96*  CO2 27   < > 27 25 29 23 26   GLUCOSE 110*   < > 100* 127* 122* 114* 123*  BUN 30*   < > 36* 39* 32* 34* 34*  CREATININE 2.05*   < > 2.07* 1.95* 1.87* 1.66* 1.79*  CALCIUM  8.9   < > 8.7* 9.2 9.1 8.7* 9.0  MG 1.8  --  2.2  --  2.3  --  2.0  PHOS  --   --  3.5  --  3.2  --  3.6   < > = values in this interval not displayed.   GFR: Estimated Creatinine Clearance: 66.6 mL/min (A) (by C-G formula based on SCr of 1.79 mg/dL (H)). Liver Function Tests: Recent Labs  Lab 11/21/23 1259 11/22/23 0637 11/23/23 0252 11/24/23 0243  AST 36 28 38 35  ALT 26 25 31  33  ALKPHOS 107 103 132* 129*  BILITOT 3.3* 2.8* 2.7* 2.3*  PROT 6.0* 5.6* 6.6 6.1*  ALBUMIN 2.8* 2.5* 3.0* 2.8*   No results for input(s): LIPASE, AMYLASE in the last 168 hours. No results for input(s): AMMONIA in the last 168 hours. Coagulation Profile: No results for input(s): INR, PROTIME in the last 168 hours. Cardiac Enzymes: No results for input(s): CKTOTAL, CKMB, CKMBINDEX, TROPONINI in the last 168 hours. BNP (last 3 results) No results for input(s): PROBNP in the last 8760 hours. HbA1C: No results for input(s): HGBA1C in the last 72 hours. CBG: Recent Labs  Lab 11/21/23 1308  GLUCAP 121*   Lipid Profile: No results for input(s): CHOL, HDL, LDLCALC, TRIG, CHOLHDL, LDLDIRECT in the last 72 hours. Thyroid  Function Tests: Recent Labs    11/21/23 1259  TSH 21.175*  FREET4 0.70   Anemia Panel: No results for input(s): VITAMINB12, FOLATE, FERRITIN, TIBC, IRON, RETICCTPCT in the last 72 hours. Sepsis Labs: No results for input(s): PROCALCITON, LATICACIDVEN in the last 168 hours.  No results found for this or any previous visit (from the past  240 hours).       Radiology Studies: No results found.      Scheduled Meds:  amiodarone   400 mg Oral BID   apixaban   5 mg Oral BID   diclofenac  Sodium  2 g Topical QID   empagliflozin   10 mg Oral Daily   feeding supplement  237 mL Oral BID BM   levothyroxine   50 mcg Oral QAC breakfast   mexiletine  150 mg Oral Q12H   pantoprazole   40 mg Oral BID   potassium chloride   60 mEq Oral Q4H   Followed by   potassium chloride   60 mEq Oral Q8H   sodium chloride  flush  3 mL Intravenous Q12H   spironolactone   50 mg Oral Daily   Continuous Infusions:  furosemide  160 mg (11/23/23 1757)     LOS: 3 days  Time spent: 40 minutes    Renato Applebaum, MD [facility]

## 2023-11-24 NOTE — Progress Notes (Signed)
   11/23/23 2200  Unmeasured Output  Emesis Occurrence 1 (yellow emesis whiles using the bathroom)

## 2023-11-24 NOTE — Progress Notes (Addendum)
 Patient Name: Bradley Powell Date of Encounter: 11/24/2023  Primary Cardiologist: None Electrophysiologist: Danelle Birmingham, MD  Interval Summary   The patient is doing well today.  Reports he vomited yesterday > had been off his GI meds since admit.   At this time, the patient denies chest pain, shortness of breath, or any new concerns.  Vital Signs    Vitals:   11/23/23 2001 11/24/23 0014 11/24/23 0424 11/24/23 0747  BP: 124/88 114/84 90/71 104/67  Pulse: 83 68 67 81  Resp: 20 20 20 20   Temp: 97.9 F (36.6 C) 97.9 F (36.6 C) 97.9 F (36.6 C) 98.8 F (37.1 C)  TempSrc: Oral Oral Oral Oral  SpO2: 100% 100% 99% 97%  Weight:   111.8 kg   Height:        Intake/Output Summary (Last 24 hours) at 11/24/2023 0855 Last data filed at 11/24/2023 0748 Gross per 24 hour  Intake 831 ml  Output 3200 ml  Net -2369 ml   Filed Weights   11/22/23 1500 11/23/23 0442 11/24/23 0424  Weight: 112.6 kg 113.1 kg 111.8 kg    Physical Exam    GEN- The patient is well appearing, alert and oriented x 3 today.   Lungs- Clear to ausculation bilaterally, normal work of breathing Cardiac- Regular rate and rhythm, no murmurs, rubs or gallops GI- soft, NT, ND, + BS Extremities- no clubbing or cyanosis. No edema  Telemetry    AF/AFL60-80's, occ PVC  (personally reviewed)  DEVICE:  [employer] ICD, implant 02/22/2022. Device interrogated with Industry > Presenting VS, VP <1%, Battery 13.5 years, stable lead impedence, threshold 1.4 V @ 0.15ms, Heart Logic Index 1 (coming down from 30's), VT zones appropriate for recent therapy (VT 210 bpm with burst x2 and ramp x2 with 10 beats each, VF 240-250 ATP x1, >250 bpm would get HV therapy).  Interrogation of device / Latitude review (this admit) showed episode of 3-4 sec NSVT on 1/4 x2 episodes, and again on 1/6 where he had VT at cycle length of 268-290 ms, received ATPx6, then 31j HV therapy x1 which broke VT.  Appears ATP at times  sped the VT up.    Hospital Course    SHONDALE QUINLEY is a 43 y.o. male with a history of HFrEF 2/2 infiltrative cardiomyopathy (LMNA cardiomyopathy), ventricular tachycardia, s/p ICD, atrial flutter s/p CTI ablation in 2019, longstanding persistent atrial fibrillation, CKD, obesity, OSA, hypertension, & gout  admitted 1/7 after being notified of ICD shock in setting of VT storm. Found to have profound hypokalemia.  Assessment & Plan    Ventricular Tachycardia  Suspect hypokalemia on admit contributing  -continue to supplement K+ -increase amiodarone  to 400 mg BID x7 days, the 400mg  daily until seen by EP ini clinic for consideration of reduction to 200mg   -continue mexiletine 150mg  BID  -see interrogation information as above, stable device check with appropriate VT zones.  Could consider more aggressive ATP but given this is his first episode, hold changes for now.   Longstanding Persistent AF AFL s/p CTI Ablation  -tele monitoring  -continue amiodarone   -OAC for stroke prophylaxis   Secondary Hypercoagulable State  -continue Eliquis  5 mg BID, dose reviewed and appropriate by age/wt  CKD IIIb -Trend BMP / urinary output -Replace electrolytes as indicated -Avoid nephrotoxic agents, ensure adequate renal perfusion  Cirrhosis  -chronically elevated bilirubin  -per primary   N/V  -per primary   Hypothyroidism  -per primary   Moderate Protein  Calorie Malnutrition  -per primary     Dispo Needs:  -EP follow requested   For questions or updates, please contact [facility] Please consult [email or web address] for contact info under Cardiology/STEMI.  Signed, Daphne Barrack, MSN, APRN, NP-C, AGACNP-BC [facility] - Electrophysiology  11/24/2023, 8:55 AM

## 2023-11-24 NOTE — Plan of Care (Signed)
   Problem: Health Behavior/Discharge Planning: Goal: Ability to manage health-related needs will improve Outcome: Progressing   Problem: Clinical Measurements: Goal: Ability to maintain clinical measurements within normal limits will improve Outcome: Progressing Goal: Will remain free from infection Outcome: Progressing Goal: Diagnostic test results will improve Outcome: Progressing

## 2023-11-24 NOTE — Consult Note (Signed)
 [facility] Liaison Consult Note    11/24/2023  BANKS CHAIKIN 1981-02-22 [id number]  Insurance: Hulan Sievert   Primary Care Provider: Rexanne Ingle, MD with [facility], this provider is listed for the transition of care follow up appointments  and [facility] Transition team calls   [facility] Liaison evaluated for post hospital needs, rounds with inpatient [facility] team.    The patient was screened for 30 day readmission hospitalization with noted rising to high medium risk score for unplanned readmission risk 2 hospital admissions in 6 months.  The patient was assessed for potential Community Care Coordination service needs for post hospital transition for care coordination. Review of patient's electronic medical record reveals patient is followed by the [facility] noted admitted for Hypokalemia Hx of VT and PVC's .   Plan: [facility] Liaison will continue to follow progress and disposition to asess for post hospital community care coordination/management needs.  Referral request for community care coordination: will follow up with if patient has needs however [facility] transition to follow.   [facility] Community Care, Population Health does not replace or interfere with any arrangements made by the Inpatient Transition of Care team.   For questions contact:   Richerd Fish, RN, BSN, CCM [place]  [facility], [facility] Liaison Direct Dial: [phone] or secure chat Email: [email or web address]

## 2023-11-25 DIAGNOSIS — I472 Ventricular tachycardia, unspecified: Secondary | ICD-10-CM | POA: Diagnosis not present

## 2023-11-25 LAB — CBC WITH DIFFERENTIAL/PLATELET
Abs Immature Granulocytes: 0.03 10*3/uL (ref 0.00–0.07)
Basophils Absolute: 0.1 10*3/uL (ref 0.0–0.1)
Basophils Relative: 1 %
Eosinophils Absolute: 0.1 10*3/uL (ref 0.0–0.5)
Eosinophils Relative: 1 %
HCT: 47.7 % (ref 39.0–52.0)
Hemoglobin: 14.9 g/dL (ref 13.0–17.0)
Immature Granulocytes: 0 %
Lymphocytes Relative: 8 %
Lymphs Abs: 0.7 10*3/uL (ref 0.7–4.0)
MCH: 25.1 pg — ABNORMAL LOW (ref 26.0–34.0)
MCHC: 31.2 g/dL (ref 30.0–36.0)
MCV: 80.4 fL (ref 80.0–100.0)
Monocytes Absolute: 0.8 10*3/uL (ref 0.1–1.0)
Monocytes Relative: 10 %
Neutro Abs: 6.2 10*3/uL (ref 1.7–7.7)
Neutrophils Relative %: 80 %
Platelets: 273 10*3/uL (ref 150–400)
RBC: 5.93 MIL/uL — ABNORMAL HIGH (ref 4.22–5.81)
RDW: 17.7 % — ABNORMAL HIGH (ref 11.5–15.5)
WBC: 7.9 10*3/uL (ref 4.0–10.5)
nRBC: 0 % (ref 0.0–0.2)

## 2023-11-25 LAB — COMPREHENSIVE METABOLIC PANEL
ALT: 33 U/L (ref 0–44)
AST: 47 U/L — ABNORMAL HIGH (ref 15–41)
Albumin: 2.8 g/dL — ABNORMAL LOW (ref 3.5–5.0)
Alkaline Phosphatase: 131 U/L — ABNORMAL HIGH (ref 38–126)
Anion gap: 14 (ref 5–15)
BUN: 41 mg/dL — ABNORMAL HIGH (ref 6–20)
CO2: 22 mmol/L (ref 22–32)
Calcium: 8.8 mg/dL — ABNORMAL LOW (ref 8.9–10.3)
Chloride: 96 mmol/L — ABNORMAL LOW (ref 98–111)
Creatinine, Ser: 1.92 mg/dL — ABNORMAL HIGH (ref 0.61–1.24)
GFR, Estimated: 45 mL/min — ABNORMAL LOW (ref >60)
Glucose, Bld: 113 mg/dL — ABNORMAL HIGH (ref 70–99)
Potassium: 4.7 mmol/L (ref 3.5–5.1)
Sodium: 132 mmol/L — ABNORMAL LOW (ref 135–145)
Total Bilirubin: 2.2 mg/dL — ABNORMAL HIGH (ref 0.0–1.2)
Total Protein: 6.1 g/dL — ABNORMAL LOW (ref 6.5–8.1)

## 2023-11-25 LAB — MAGNESIUM: Magnesium: 2.3 mg/dL (ref 1.7–2.4)

## 2023-11-25 LAB — PHOSPHORUS: Phosphorus: 3.4 mg/dL (ref 2.5–4.6)

## 2023-11-25 MED ORDER — TORSEMIDE 20 MG PO TABS
40.0000 mg | ORAL_TABLET | Freq: Every day | ORAL | Status: DC
  Administered 2023-11-25: 40 mg via ORAL
  Filled 2023-11-25: qty 2

## 2023-11-25 MED ORDER — DOCUSATE SODIUM 100 MG PO CAPS
100.0000 mg | ORAL_CAPSULE | Freq: Two times a day (BID) | ORAL | Status: DC
  Administered 2023-11-25: 100 mg via ORAL
  Filled 2023-11-25 (×5): qty 1

## 2023-11-25 MED ORDER — TORSEMIDE 20 MG PO TABS
80.0000 mg | ORAL_TABLET | Freq: Every day | ORAL | Status: DC
  Administered 2023-11-26: 80 mg via ORAL
  Filled 2023-11-25: qty 4

## 2023-11-25 NOTE — Plan of Care (Signed)
  Problem: Clinical Measurements: Goal: Will remain free from infection Outcome: Progressing   Problem: Nutrition: Goal: Adequate nutrition will be maintained Outcome: Progressing   Problem: Coping: Goal: Level of anxiety will decrease Outcome: Progressing   Problem: Elimination: Goal: Will not experience complications related to urinary retention Outcome: Progressing   Problem: Pain Management: Goal: General experience of comfort will improve Outcome: Progressing   Problem: Safety: Goal: Ability to remain free from injury will improve Outcome: Progressing   Problem: Skin Integrity: Goal: Risk for impaired skin integrity will decrease Outcome: Progressing   Problem: Elimination: Goal: Will not experience complications related to bowel motility Outcome: Not Progressing Note: Pt refused stool softener.

## 2023-11-25 NOTE — Plan of Care (Signed)

## 2023-11-25 NOTE — Progress Notes (Signed)
 PROGRESS NOTE    Bradley Powell  FMW:[id number] DOB: 1980/12/24 DOA: 11/21/2023 PCP: Rexanne Ingle, MD    Brief Narrative:  43 year old with history of chronic combined heart failure with known ejection fraction of 40%, history of PVCs, AICD, sleep apnea on CPAP history of a flutter status post ablation on Eliquis  and amiodarone , CKD stage IIIb, hypothyroidism, obesity and GERD, high dose of diuretics at home presented with last few weeks of nausea, large vomiting.  He was also notified by [employer] that he had 3 AICD discharges.  Patient did not feel any shocks.  In the emergency room he was found to be hypokalemic.  Admitted with cardiology consultation.  Subjective:  Patient seen and examined.  No overnight events.  Denies any chest pain shortness of breath.  He is diuresing well with more than 3 L urine output in 24 hours. Patient was upset with not having enough breakfast choices today.  He thinks IV Lasix  works better than oral Lasix .  Some PVCs noted in the monitor.   Assessment & Plan:   Ventricular tachycardia status post AICD shocks: This was thought to be electrolyte mediated.  Cardiology following.  Currently stable.  No evidence of arrhythmias in the monitor.  Continues on amiodarone  and mexiletine.  Replacing electrolytes. EP cardiology following.Patient is on amiodarone  and mexiletine.  Therapeutic on Eliquis .  Not recommending any changes.  Acute on chronic heart failure with reduced ejection fraction, history of nonischemic cardiomyopathy,Continues to be on spironolactone .   Followed by heart failure team.   Back on Jardiance , was treated with IV Lasix , probably cardiology will change to oral torsemide  today and monitor.   high-dose potassium replacement and Aldactone .  Hypokalemia: Severe and persistent.  Aggressive IV and oral replacement.  Stabilizing now.  Chronic a flutter: Rate controlled.  On amiodarone .  Therapeutic on Eliquis .  CKD stage IIIb:  Creatinine at about baseline of 1.8-2.  Will closely monitor on diuretics.  Obesity class II: BMI 37.  Diet and excise.  Sleep apnea: Using CPAP at night.   DVT prophylaxis:  apixaban  (ELIQUIS ) tablet 5 mg   Code Status: Full code Family Communication: None at the bedside Disposition Plan: Status is: Inpatient Remains inpatient appropriate because: Significant electrolyte abnormality, IV diuresis     Consultants:  Cardiology  Procedures:  None  Antimicrobials:  None     Objective: Vitals:   11/25/23 0337 11/25/23 0847 11/25/23 1246 11/25/23 1248  BP:  107/74  104/69  Pulse:      Resp: 18 16  14   Temp: 98.3 F (36.8 C) 97.9 F (36.6 C) 97.6 F (36.4 C)   TempSrc: Oral Oral Oral   SpO2:  100%  99%  Weight:      Height:        Intake/Output Summary (Last 24 hours) at 11/25/2023 1425 Last data filed at 11/25/2023 1000 Gross per 24 hour  Intake 668.86 ml  Output 3875 ml  Net -3206.14 ml   Filed Weights   11/23/23 0442 11/24/23 0424 11/25/23 0234  Weight: 113.1 kg 111.8 kg 111.8 kg    Examination:  General exam: Appears calm and comfortable.  On room air.  Eating breakfast in the morning rounds. Respiratory system: Clear to auscultation. Respiratory effort normal. Cardiovascular system: S1 & S2 heard, RRR.  1+ bilateral pedal edema.  Nontender.  AICD present left precordium. Gastrointestinal system: Abdomen is nondistended, soft and nontender. No organomegaly or masses felt. Normal bowel sounds heard. Central nervous system: Alert and oriented. No  focal neurological deficits. Extremities: Symmetric 5 x 5 power. Skin: No rashes, lesions or ulcers Psychiatry: Judgement and insight appear normal. Mood & affect appropriate.     Data Reviewed: I have personally reviewed following labs and imaging studies  CBC: Recent Labs  Lab 11/21/23 1259 11/22/23 0637 11/23/23 0252 11/24/23 0243 11/25/23 0232  WBC 7.5 5.8 7.2 7.9 7.9  NEUTROABS 5.9 4.4 5.5 6.1  6.2  HGB 14.5 14.0 15.6 15.3 14.9  HCT 45.0 43.8 49.3 49.2 47.7  MCV 79.9* 79.3* 80.0 80.7 80.4  PLT 266 248 299 285 273   Basic Metabolic Panel: Recent Labs  Lab 11/21/23 1259 11/21/23 2328 11/22/23 0637 11/22/23 1746 11/23/23 0252 11/23/23 1400 11/24/23 0243 11/24/23 1453 11/25/23 0232  NA 134*   < > 131*   < > 133* 130* 135 132* 132*  K 2.5*   < > 2.7*   < > 2.8* 4.0 3.4* 3.9 4.7  CL 93*   < > 92*   < > 93* 99 96* 98 96*  CO2 27   < > 27   < > 29 23 26 23 22   GLUCOSE 110*   < > 100*   < > 122* 114* 123* 119* 113*  BUN 30*   < > 36*   < > 32* 34* 34* 35* 41*  CREATININE 2.05*   < > 2.07*   < > 1.87* 1.66* 1.79* 1.90* 1.92*  CALCIUM  8.9   < > 8.7*   < > 9.1 8.7* 9.0 8.5* 8.8*  MG 1.8  --  2.2  --  2.3  --  2.0  --  2.3  PHOS  --   --  3.5  --  3.2  --  3.6  --  3.4   < > = values in this interval not displayed.   GFR: Estimated Creatinine Clearance: 62.1 mL/min (A) (by C-G formula based on SCr of 1.92 mg/dL (H)). Liver Function Tests: Recent Labs  Lab 11/21/23 1259 11/22/23 0637 11/23/23 0252 11/24/23 0243 11/25/23 0232  AST 36 28 38 35 47*  ALT 26 25 31  33 33  ALKPHOS 107 103 132* 129* 131*  BILITOT 3.3* 2.8* 2.7* 2.3* 2.2*  PROT 6.0* 5.6* 6.6 6.1* 6.1*  ALBUMIN 2.8* 2.5* 3.0* 2.8* 2.8*   No results for input(s): LIPASE, AMYLASE in the last 168 hours. No results for input(s): AMMONIA in the last 168 hours. Coagulation Profile: No results for input(s): INR, PROTIME in the last 168 hours. Cardiac Enzymes: No results for input(s): CKTOTAL, CKMB, CKMBINDEX, TROPONINI in the last 168 hours. BNP (last 3 results) No results for input(s): PROBNP in the last 8760 hours. HbA1C: No results for input(s): HGBA1C in the last 72 hours. CBG: Recent Labs  Lab 11/21/23 1308  GLUCAP 121*   Lipid Profile: No results for input(s): CHOL, HDL, LDLCALC, TRIG, CHOLHDL, LDLDIRECT in the last 72 hours. Thyroid  Function Tests: No results for  input(s): TSH, T4TOTAL, FREET4, T3FREE, THYROIDAB in the last 72 hours.  Anemia Panel: No results for input(s): VITAMINB12, FOLATE, FERRITIN, TIBC, IRON, RETICCTPCT in the last 72 hours. Sepsis Labs: No results for input(s): PROCALCITON, LATICACIDVEN in the last 168 hours.  No results found for this or any previous visit (from the past 240 hours).       Radiology Studies: No results found.      Scheduled Meds:  amiodarone   400 mg Oral BID   apixaban   5 mg Oral BID   diclofenac  Sodium  2 g  Topical QID   empagliflozin   10 mg Oral Daily   feeding supplement  237 mL Oral BID BM   levothyroxine   50 mcg Oral QAC breakfast   mexiletine  150 mg Oral Q12H   pantoprazole   40 mg Oral BID   potassium chloride   40 mEq Oral Q8H   sodium chloride  flush  3 mL Intravenous Q12H   spironolactone   50 mg Oral Daily   [START ON 11/26/2023] torsemide   80 mg Oral Daily   And   torsemide   40 mg Oral Daily   Continuous Infusions:     LOS: 4 days    Time spent: 40 minutes    Renato Applebaum, MD [facility]

## 2023-11-25 NOTE — Progress Notes (Signed)
 Progress Note  Patient Name: Bradley Powell Date of Encounter: 11/25/2023  Primary Cardiologist:   None   Subjective   No chest pain.  No SOB.   Inpatient Medications    Scheduled Meds:  amiodarone   400 mg Oral BID   apixaban   5 mg Oral BID   diclofenac  Sodium  2 g Topical QID   empagliflozin   10 mg Oral Daily   feeding supplement  237 mL Oral BID BM   levothyroxine   50 mcg Oral QAC breakfast   mexiletine  150 mg Oral Q12H   pantoprazole   40 mg Oral BID   potassium chloride   40 mEq Oral Q8H   sodium chloride  flush  3 mL Intravenous Q12H   spironolactone   50 mg Oral Daily   Continuous Infusions:  furosemide  160 mg (11/25/23 0854)   PRN Meds: acetaminophen , sodium chloride  flush   Vital Signs    Vitals:   11/24/23 2012 11/25/23 0234 11/25/23 0337 11/25/23 0847  BP: 111/74   107/74  Pulse:      Resp: 18  18 16   Temp: 98.3 F (36.8 C)  98.3 F (36.8 C) 97.9 F (36.6 C)  TempSrc: Oral  Oral Oral  SpO2: 100%   100%  Weight:  111.8 kg    Height:        Intake/Output Summary (Last 24 hours) at 11/25/2023 1156 Last data filed at 11/25/2023 1000 Gross per 24 hour  Intake 668.86 ml  Output 3875 ml  Net -3206.14 ml   Filed Weights   11/23/23 0442 11/24/23 0424 11/25/23 0234  Weight: 113.1 kg 111.8 kg 111.8 kg    Telemetry    NSR - Personally Reviewed  ECG    NA - Personally Reviewed  Physical Exam   GEN: No acute distress.   Neck: No  JVD Cardiac: RRR, 3/6 apical systolic murmur, no diastolic murmurs, rubs, or gallops.  Respiratory: Clear  to auscultation bilaterally. GI: Soft, nontender, non-distended  MS: No  edema; No deformity. Neuro:  Nonfocal  Psych: Normal affect   Labs    Chemistry Recent Labs  Lab 11/23/23 0252 11/23/23 1400 11/24/23 0243 11/24/23 1453 11/25/23 0232  NA 133*   < > 135 132* 132*  K 2.8*   < > 3.4* 3.9 4.7  CL 93*   < > 96* 98 96*  CO2 29   < > 26 23 22   GLUCOSE 122*   < > 123* 119* 113*  BUN 32*   < >  34* 35* 41*  CREATININE 1.87*   < > 1.79* 1.90* 1.92*  CALCIUM  9.1   < > 9.0 8.5* 8.8*  PROT 6.6  --  6.1*  --  6.1*  ALBUMIN 3.0*  --  2.8*  --  2.8*  AST 38  --  35  --  47*  ALT 31  --  33  --  33  ALKPHOS 132*  --  129*  --  131*  BILITOT 2.7*  --  2.3*  --  2.2*  GFRNONAA 46*   < > 49* 45* 45*  ANIONGAP 11   < > 13 11 14    < > = values in this interval not displayed.     Hematology Recent Labs  Lab 11/23/23 0252 11/24/23 0243 11/25/23 0232  WBC 7.2 7.9 7.9  RBC 6.16* 6.10* 5.93*  HGB 15.6 15.3 14.9  HCT 49.3 49.2 47.7  MCV 80.0 80.7 80.4  MCH 25.3* 25.1* 25.1*  MCHC 31.6  31.1 31.2  RDW 17.7* 17.5* 17.7*  PLT 299 285 273    Cardiac EnzymesNo results for input(s): TROPONINI in the last 168 hours. No results for input(s): TROPIPOC in the last 168 hours.   BNP Recent Labs  Lab 11/22/23 0637 11/23/23 0252 11/24/23 0243  BNP 292.3* 300.3* 291.4*     DDimer No results for input(s): DDIMER in the last 168 hours.   Radiology    No results found.  Cardiac Studies   10/16/2023 echocardiogram   1. Limited study for LV function   2. Left ventricular ejection fraction, by estimation, is 35 to 40%. Left  ventricular ejection fraction by 2D MOD biplane is 37.9 %. The left  ventricle has moderately decreased function. The left ventricle  demonstrates global hypokinesis. There is mild  left ventricular hypertrophy.   3. The mitral valve is abnormal. Mild mitral valve regurgitation.   4. Tricuspid valve regurgitation is moderate.   5. Right ventricular systolic function is mildly reduced. The right  ventricular size is normal. There is normal pulmonary artery systolic  pressure. The estimated right ventricular systolic pressure is 35.2 mmHg.   6. The inferior vena cava is dilated in size with <50% respiratory  variability, suggesting right atrial pressure of 15 mmHg.   7. Rhythm strip during this exam demonstrates atrial flutter.    Patient Profile     43  y.o. male old w/ LMNA cardiomyopathy/chronic HFrEF, VT, CKD stage IIIb, permanent AF, and obesity. Admitted with VT storm in the setting of electrolyte imbalance.   Assessment & Plan    L MNA cardiomyopathy: To switch to 80 mg of torsemide  p.o. in the morning and 40 mg in the afternoon this evening.  Will stop IV Lasix .    Ventricular tachycardia: Status post VT and appropriate shock.  Was hypokalemic.  Potassium has been replaced.  Continue amiodarone  and mexiletine.  EP is reloading amiodarone .     Atrial fibrillation/flutter:  Continue Eliquis .  For questions or updates, please contact [facility] Please consult [email or web address] for contact info under Cardiology/STEMI.   Signed, Lynwood Schilling, MD  11/25/2023, 11:56 AM

## 2023-11-25 NOTE — Progress Notes (Signed)
 Pt had non sustained 10 beats of V-tach. Dr Margo Aye is notified. Mg: 2.3 and K 4.7 this AM. Pt is stable lying in bed has no complains or showing any symptoms. No new orders.  Continue to monitor

## 2023-11-26 DIAGNOSIS — I472 Ventricular tachycardia, unspecified: Secondary | ICD-10-CM | POA: Diagnosis not present

## 2023-11-26 DIAGNOSIS — E66812 Obesity, class 2: Secondary | ICD-10-CM

## 2023-11-26 DIAGNOSIS — I5023 Acute on chronic systolic (congestive) heart failure: Secondary | ICD-10-CM

## 2023-11-26 DIAGNOSIS — N189 Chronic kidney disease, unspecified: Secondary | ICD-10-CM

## 2023-11-26 DIAGNOSIS — N179 Acute kidney failure, unspecified: Secondary | ICD-10-CM

## 2023-11-26 DIAGNOSIS — I482 Chronic atrial fibrillation, unspecified: Secondary | ICD-10-CM | POA: Diagnosis not present

## 2023-11-26 DIAGNOSIS — K7460 Unspecified cirrhosis of liver: Secondary | ICD-10-CM

## 2023-11-26 LAB — COMPREHENSIVE METABOLIC PANEL
ALT: 32 U/L (ref 0–44)
AST: 33 U/L (ref 15–41)
Albumin: 2.8 g/dL — ABNORMAL LOW (ref 3.5–5.0)
Alkaline Phosphatase: 120 U/L (ref 38–126)
Anion gap: 15 (ref 5–15)
BUN: 39 mg/dL — ABNORMAL HIGH (ref 6–20)
CO2: 20 mmol/L — ABNORMAL LOW (ref 22–32)
Calcium: 8.9 mg/dL (ref 8.9–10.3)
Chloride: 98 mmol/L (ref 98–111)
Creatinine, Ser: 2.06 mg/dL — ABNORMAL HIGH (ref 0.61–1.24)
GFR, Estimated: 41 mL/min — ABNORMAL LOW (ref >60)
Glucose, Bld: 148 mg/dL — ABNORMAL HIGH (ref 70–99)
Potassium: 4.2 mmol/L (ref 3.5–5.1)
Sodium: 133 mmol/L — ABNORMAL LOW (ref 135–145)
Total Bilirubin: 2.6 mg/dL — ABNORMAL HIGH (ref 0.0–1.2)
Total Protein: 6 g/dL — ABNORMAL LOW (ref 6.5–8.1)

## 2023-11-26 LAB — CBC WITH DIFFERENTIAL/PLATELET
Abs Immature Granulocytes: 0.03 10*3/uL (ref 0.00–0.07)
Basophils Absolute: 0.1 10*3/uL (ref 0.0–0.1)
Basophils Relative: 1 %
Eosinophils Absolute: 0 10*3/uL (ref 0.0–0.5)
Eosinophils Relative: 1 %
HCT: 48.9 % (ref 39.0–52.0)
Hemoglobin: 15.2 g/dL (ref 13.0–17.0)
Immature Granulocytes: 0 %
Lymphocytes Relative: 8 %
Lymphs Abs: 0.6 10*3/uL — ABNORMAL LOW (ref 0.7–4.0)
MCH: 25.1 pg — ABNORMAL LOW (ref 26.0–34.0)
MCHC: 31.1 g/dL (ref 30.0–36.0)
MCV: 80.7 fL (ref 80.0–100.0)
Monocytes Absolute: 0.9 10*3/uL (ref 0.1–1.0)
Monocytes Relative: 12 %
Neutro Abs: 6 10*3/uL (ref 1.7–7.7)
Neutrophils Relative %: 78 %
Platelets: 278 10*3/uL (ref 150–400)
RBC: 6.06 MIL/uL — ABNORMAL HIGH (ref 4.22–5.81)
RDW: 17.9 % — ABNORMAL HIGH (ref 11.5–15.5)
WBC: 7.7 10*3/uL (ref 4.0–10.5)
nRBC: 0 % (ref 0.0–0.2)

## 2023-11-26 MED ORDER — FUROSEMIDE 10 MG/ML IJ SOLN
80.0000 mg | Freq: Two times a day (BID) | INTRAMUSCULAR | Status: DC
  Administered 2023-11-26: 80 mg via INTRAVENOUS
  Filled 2023-11-26 (×2): qty 8

## 2023-11-26 MED ORDER — POTASSIUM CHLORIDE CRYS ER 20 MEQ PO TBCR
40.0000 meq | EXTENDED_RELEASE_TABLET | Freq: Every day | ORAL | Status: DC
  Administered 2023-11-26 – 2023-11-27 (×2): 40 meq via ORAL
  Filled 2023-11-26 (×2): qty 2

## 2023-11-26 NOTE — Assessment & Plan Note (Addendum)
 Echocardiogram with reduced LV systolic function, EF 35 to 40%, global hypokinesis, mild reduction in RV systolic function, mid MR and moderate TR,   Urine output is 4,300 ml Systolic blood pressure 100's range.   Changed to furosemide  120 mg bid.  Metolazone  5 mg (one dose yesterday and one today)   Acetazolamide  500 mg  (one dose yesterday and one today)  Continue with spironolactone  50 mg and empagliflozin .

## 2023-11-26 NOTE — Assessment & Plan Note (Addendum)
 CKD stage 3b.  Hyponatremia, hypokalemia   Renal function with serum cr at 2,17 with K at 3,9 and serum bicarbonate at 26  Na 133,   Plan to continue diuresis, follow up renal panel and electrolytes in am.  Correct K and Mg.

## 2023-11-26 NOTE — Progress Notes (Signed)
 Progress Note  Patient Name: Bradley Powell Date of Encounter: 11/26/2023  Primary Cardiologist:   None   Subjective   He feels still like he has excess volume and that he has not been urinating enough.  No acute SOB.  Inpatient Medications    Scheduled Meds:  amiodarone   400 mg Oral BID   apixaban   5 mg Oral BID   diclofenac  Sodium  2 g Topical QID   docusate sodium   100 mg Oral BID   empagliflozin   10 mg Oral Daily   feeding supplement  237 mL Oral BID BM   levothyroxine   50 mcg Oral QAC breakfast   mexiletine  150 mg Oral Q12H   pantoprazole   40 mg Oral BID   sodium chloride  flush  3 mL Intravenous Q12H   spironolactone   50 mg Oral Daily   torsemide   80 mg Oral Daily   And   torsemide   40 mg Oral Daily   Continuous Infusions:   PRN Meds: acetaminophen , sodium chloride  flush   Vital Signs    Vitals:   11/25/23 1934 11/26/23 0013 11/26/23 0552 11/26/23 0831  BP: 107/69 102/67 100/76 117/74  Pulse: 75 66 71 79  Resp: 18 18 18 18   Temp: 98 F (36.7 C) 98.1 F (36.7 C) 98.3 F (36.8 C) 98.5 F (36.9 C)  TempSrc: Oral Oral Oral Oral  SpO2: 100% 100% 98% 100%  Weight:   111.9 kg   Height:        Intake/Output Summary (Last 24 hours) at 11/26/2023 1046 Last data filed at 11/26/2023 0553 Gross per 24 hour  Intake 952 ml  Output 1950 ml  Net -998 ml   Filed Weights   11/24/23 0424 11/25/23 0234 11/26/23 0552  Weight: 111.8 kg 111.8 kg 111.9 kg    Telemetry    NSR, ventricular couplet.  - Personally Reviewed  ECG    NA - Personally Reviewed  Physical Exam   GEN: No  acute distress.   Neck: Positive 12cm at 45 degrees Cardiac: RRR, no murmurs, rubs, or gallops.  Respiratory: Clear   to auscultation bilaterally. GI: Soft, nontender, non-distended, normal bowel sounds  MS:  Mild bilateral leg edema; No deformity. Neuro:   Nonfocal  Psych: Oriented and appropriate    Labs    Chemistry Recent Labs  Lab 11/24/23 0243 11/24/23 1453  11/25/23 0232 11/26/23 0425  NA 135 132* 132* 133*  K 3.4* 3.9 4.7 4.2  CL 96* 98 96* 98  CO2 26 23 22  20*  GLUCOSE 123* 119* 113* 148*  BUN 34* 35* 41* 39*  CREATININE 1.79* 1.90* 1.92* 2.06*  CALCIUM  9.0 8.5* 8.8* 8.9  PROT 6.1*  --  6.1* 6.0*  ALBUMIN 2.8*  --  2.8* 2.8*  AST 35  --  47* 33  ALT 33  --  33 32  ALKPHOS 129*  --  131* 120  BILITOT 2.3*  --  2.2* 2.6*  GFRNONAA 49* 45* 45* 41*  ANIONGAP 13 11 14 15      Hematology Recent Labs  Lab 11/24/23 0243 11/25/23 0232 11/26/23 0425  WBC 7.9 7.9 7.7  RBC 6.10* 5.93* 6.06*  HGB 15.3 14.9 15.2  HCT 49.2 47.7 48.9  MCV 80.7 80.4 80.7  MCH 25.1* 25.1* 25.1*  MCHC 31.1 31.2 31.1  RDW 17.5* 17.7* 17.9*  PLT 285 273 278    Cardiac EnzymesNo results for input(s): TROPONINI in the last 168 hours. No results for input(s): TROPIPOC in  the last 168 hours.   BNP Recent Labs  Lab 11/22/23 0637 11/23/23 0252 11/24/23 0243  BNP 292.3* 300.3* 291.4*     DDimer No results for input(s): DDIMER in the last 168 hours.   Radiology    No results found.  Cardiac Studies   10/16/2023 echocardiogram   1. Limited study for LV function   2. Left ventricular ejection fraction, by estimation, is 35 to 40%. Left  ventricular ejection fraction by 2D MOD biplane is 37.9 %. The left  ventricle has moderately decreased function. The left ventricle  demonstrates global hypokinesis. There is mild  left ventricular hypertrophy.   3. The mitral valve is abnormal. Mild mitral valve regurgitation.   4. Tricuspid valve regurgitation is moderate.   5. Right ventricular systolic function is mildly reduced. The right  ventricular size is normal. There is normal pulmonary artery systolic  pressure. The estimated right ventricular systolic pressure is 35.2 mmHg.   6. The inferior vena cava is dilated in size with <50% respiratory  variability, suggesting right atrial pressure of 15 mmHg.   7. Rhythm strip during this exam  demonstrates atrial flutter.    Patient Profile     43 y.o. male old w/ LMNA cardiomyopathy/chronic HFrEF, VT, CKD stage IIIb, permanent AF, and obesity. Admitted with VT storm in the setting of electrolyte imbalance.   Assessment & Plan    L MNA cardiomyopathy:    Yesterday switched to 80 mg of torsemide  p.o. in the morning and 40 mg in the afternoon this evening.  Stopped IV Lasix .  However, he does still seem to be congested and so I will resume IV Lasix  tonight and tomorrow morning with    Ventricular tachycardia:   No further sustained arrhythmias with supplementation of potassium.  Continue meds as listed including increased dose of amiodarone .    Atrial fibrillation/flutter:  Continue Eliquis .  CKD:  Creat continue to remain around 2.0.  Will follow.  Diuretic as above.   For questions or updates, please contact [facility] Please consult [email or web address] for contact info under Cardiology/STEMI.   Signed, Lynwood Schilling, MD  11/26/2023, 10:46 AM

## 2023-11-26 NOTE — Assessment & Plan Note (Addendum)
 Continue rate control with amiodarone and mexiletin Anticoagulation with apixaban.

## 2023-11-26 NOTE — Plan of Care (Signed)

## 2023-11-26 NOTE — Hospital Course (Addendum)
 [name] was admitted to the hospital with decompensated heart failure.   43 year old with history of chronic combined heart failure with known ejection fraction of 40%, history of PVCs, AICD, sleep apnea on CPAP history of a flutter status post ablation on Eliquis  and amiodarone , CKD stage IIIb, hypothyroidism, obesity and GERD, high dose of diuretics at home presented with last few weeks of nausea, large vomiting.  He was also notified by [employer] that he had 3 AICD discharges.  Patient did not feel any shocks.  In the emergency room he was found to be hypokalemic.   Patient was placed on aggressive diuretic regimen, sequential nephron blockade.   01/13 responding to diuresis.  01/14 improving volume status, possible discharge tomorrow.

## 2023-11-26 NOTE — Progress Notes (Addendum)
  Progress Note   Patient: Bradley Powell FMW:[id number] DOB: [birth year] DOA: 11/21/2023     5 DOS: the patient was seen and examined on 11/26/2023   Brief hospital course: 43 year old with history of chronic combined heart failure with known ejection fraction of 40%, history of PVCs, AICD, sleep apnea on CPAP history of a flutter status post ablation on Eliquis  and amiodarone , CKD stage IIIb, hypothyroidism, obesity and GERD, high dose of diuretics at home presented with last few weeks of nausea, large vomiting.  He was also notified by [employer] that he had 3 AICD discharges.  Patient did not feel any shocks.  In the emergency room he was found to be hypokalemic.  Admitted with cardiology consultation.   Assessment and Plan: Acute on chronic systolic (congestive) heart failure (HCC) Echocardiogram with reduced LV systolic function, EF 35 to 40%, global hypokinesis, mild reduction in RV systolic function, mid MR and moderate TR,   Urine output is 3,900 ml Systolic blood pressure 100's range.   Plan to continue diuresis with furosemide , 80 mg IV bid Continue with spironolactone  and empagliflozin .   Atrial fibrillation, chronic (HCC) Continue rate control with amiodarone  and mexiletin Anticoagulation with apixaban .   Acute kidney injury superimposed on chronic kidney disease (HCC) CKD stage 3b.  Hyponatremia, hypokalemia   Renal function with serum cr at 2.0 with K at 4.2 and serum bicarbonate at 20  Na 133   Plan to continue diuresis, follow up renal panel and electrolytes in am.   Hepatic cirrhosis (HCC) Compensated lived disease.   Obesity, class 2 Calculated BMI is 37.1   Hypothyroidism Continue with levothyroxine    Iron deficiency Cell count has been stable.         Subjective: Patient is having worsening edema today, with mild dyspnea, no chest pain   Physical Exam: Vitals:   11/26/23 0013 11/26/23 0552 11/26/23 0831 11/26/23 1250  BP: 102/67 100/76  117/74   Pulse: 66 71 79   Resp: 18 18 18 20   Temp: 98.1 F (36.7 C) 98.3 F (36.8 C) 98.5 F (36.9 C)   TempSrc: Oral Oral Oral Oral  SpO2: 100% 98% 100%   Weight:  111.9 kg    Height:       Neurology awake and alert ENT with mild pallor Cardiovascular with S1 and S2 present and regular with no gallops, rubs or murmurs Respiratory with no rales or wheezing Abdomen with no distention  Positive lower extremity edema   Data Reviewed:   Family Communication: no family at the bedside   Disposition: Status is: Inpatient Remains inpatient appropriate because: IV diuresis   Planned Discharge Destination: Home      Author: Elidia Toribio Furnace, MD 11/26/2023 2:09 PM  For on call review [email or web address].

## 2023-11-26 NOTE — Assessment & Plan Note (Signed)
Calculated BMI is 37.1

## 2023-11-26 NOTE — Assessment & Plan Note (Signed)
 Continue with levothyroxine

## 2023-11-26 NOTE — Assessment & Plan Note (Signed)
 Cell count has been stable.

## 2023-11-26 NOTE — Assessment & Plan Note (Signed)
 Compensated lived disease.

## 2023-11-27 DIAGNOSIS — E039 Hypothyroidism, unspecified: Secondary | ICD-10-CM

## 2023-11-27 DIAGNOSIS — E611 Iron deficiency: Secondary | ICD-10-CM

## 2023-11-27 DIAGNOSIS — I482 Chronic atrial fibrillation, unspecified: Secondary | ICD-10-CM | POA: Diagnosis not present

## 2023-11-27 DIAGNOSIS — N179 Acute kidney failure, unspecified: Secondary | ICD-10-CM | POA: Diagnosis not present

## 2023-11-27 DIAGNOSIS — I5023 Acute on chronic systolic (congestive) heart failure: Secondary | ICD-10-CM | POA: Diagnosis not present

## 2023-11-27 DIAGNOSIS — K7460 Unspecified cirrhosis of liver: Secondary | ICD-10-CM | POA: Diagnosis not present

## 2023-11-27 LAB — MAGNESIUM: Magnesium: 1.8 mg/dL (ref 1.7–2.4)

## 2023-11-27 LAB — BASIC METABOLIC PANEL
Anion gap: 13 (ref 5–15)
BUN: 35 mg/dL — ABNORMAL HIGH (ref 6–20)
CO2: 24 mmol/L (ref 22–32)
Calcium: 8.6 mg/dL — ABNORMAL LOW (ref 8.9–10.3)
Chloride: 96 mmol/L — ABNORMAL LOW (ref 98–111)
Creatinine, Ser: 2.16 mg/dL — ABNORMAL HIGH (ref 0.61–1.24)
GFR, Estimated: 39 mL/min — ABNORMAL LOW (ref >60)
Glucose, Bld: 137 mg/dL — ABNORMAL HIGH (ref 70–99)
Potassium: 3.4 mmol/L — ABNORMAL LOW (ref 3.5–5.1)
Sodium: 133 mmol/L — ABNORMAL LOW (ref 135–145)

## 2023-11-27 MED ORDER — POTASSIUM CHLORIDE CRYS ER 20 MEQ PO TBCR
40.0000 meq | EXTENDED_RELEASE_TABLET | ORAL | Status: AC
  Administered 2023-11-27 (×3): 40 meq via ORAL
  Filled 2023-11-27 (×3): qty 2

## 2023-11-27 MED ORDER — FUROSEMIDE 10 MG/ML IJ SOLN
120.0000 mg | Freq: Two times a day (BID) | INTRAVENOUS | Status: DC
  Administered 2023-11-27 – 2023-11-29 (×6): 120 mg via INTRAVENOUS
  Filled 2023-11-27: qty 10
  Filled 2023-11-27: qty 12
  Filled 2023-11-27 (×4): qty 10
  Filled 2023-11-27: qty 12
  Filled 2023-11-27: qty 2
  Filled 2023-11-27: qty 12

## 2023-11-27 MED ORDER — MAGNESIUM SULFATE 4 GM/100ML IV SOLN
4.0000 g | Freq: Once | INTRAVENOUS | Status: AC
  Administered 2023-11-27: 4 g via INTRAVENOUS
  Filled 2023-11-27: qty 100

## 2023-11-27 MED ORDER — METOLAZONE 5 MG PO TABS
5.0000 mg | ORAL_TABLET | Freq: Once | ORAL | Status: AC
  Administered 2023-11-27: 5 mg via ORAL
  Filled 2023-11-27: qty 1

## 2023-11-27 NOTE — Progress Notes (Addendum)
 Advanced Heart Failure Rounding Note  HF Cardiologist: Dr. Cherrie  Chief Complaint: Heart Failure  Subjective:    Had brief morphology change 11/25/2022 but HR only around 80. Per EP, not likely VT.   2.8 L in UOP yesterday w/ IV Lasix . He denies resting dyspnea but can tell he is still retaining fluid. Still up 10 lb from previous d/c dry wt.    Objective:   Weight Range: 112.1 kg Body mass index is 37.59 kg/m.   Vital Signs:   Temp:  [97.8 F (36.6 C)-98.5 F (36.9 C)] 98 F (36.7 C) (01/13 0804) Pulse Rate:  [68-84] 84 (01/13 0804) Resp:  [17-20] 20 (01/13 0804) BP: (100-104)/(61-77) 101/61 (01/13 0804) SpO2:  [95 %-100 %] 95 % (01/13 0804) Weight:  [112.1 kg] 112.1 kg (01/13 0422) Last BM Date : 11/26/23  Weight change: Filed Weights   11/25/23 0234 11/26/23 0552 11/27/23 0422  Weight: 111.8 kg 111.9 kg 112.1 kg    Intake/Output:   Intake/Output Summary (Last 24 hours) at 11/27/2023 0832 Last data filed at 11/27/2023 0700 Gross per 24 hour  Intake 920 ml  Output 2760 ml  Net -1840 ml      Physical Exam   General:  Well appearing. No respiratory difficulty HEENT: normal Neck: supple. JVD elevated to jear. Carotids 2+ bilat; no bruits. No lymphadenopathy or thyromegaly appreciated. Cor: PMI nondisplaced. Regular rate & rhythm. No rubs, gallops or murmurs. Lungs: clear Abdomen: soft, nontender, nondistended. No hepatosplenomegaly. No bruits or masses. Good bowel sounds. Extremities: no cyanosis, clubbing, rash, 1+ b/l LE edema L>R Neuro: alert & oriented x 3, cranial nerves grossly intact. moves all 4 extremities w/o difficulty. Affect pleasant.    Telemetry    A flutter 60-80s   EKG    No new  Labs    CBC Recent Labs    11/25/23 0232 11/26/23 0425  WBC 7.9 7.7  NEUTROABS 6.2 6.0  HGB 14.9 15.2  HCT 47.7 48.9  MCV 80.4 80.7  PLT 273 278   Basic Metabolic Panel Recent Labs    98/88/74 0232 11/26/23 0425  NA 132* 133*  K 4.7  4.2  CL 96* 98  CO2 22 20*  GLUCOSE 113* 148*  BUN 41* 39*  CREATININE 1.92* 2.06*  CALCIUM  8.8* 8.9  MG 2.3  --   PHOS 3.4  --    Liver Function Tests Recent Labs    11/25/23 0232 11/26/23 0425  AST 47* 33  ALT 33 32  ALKPHOS 131* 120  BILITOT 2.2* 2.6*  PROT 6.1* 6.0*  ALBUMIN 2.8* 2.8*   No results for input(s): LIPASE, AMYLASE in the last 72 hours. Cardiac Enzymes No results for input(s): CKTOTAL, CKMB, CKMBINDEX, TROPONINI in the last 72 hours.  BNP: BNP (last 3 results) Recent Labs    11/22/23 0637 11/23/23 0252 11/24/23 0243  BNP 292.3* 300.3* 291.4*    ProBNP (last 3 results) No results for input(s): PROBNP in the last 8760 hours.   D-Dimer No results for input(s): DDIMER in the last 72 hours. Hemoglobin A1C No results for input(s): HGBA1C in the last 72 hours. Fasting Lipid Panel No results for input(s): CHOL, HDL, LDLCALC, TRIG, CHOLHDL, LDLDIRECT in the last 72 hours. Thyroid  Function Tests No results for input(s): TSH, T4TOTAL, T3FREE, THYROIDAB in the last 72 hours.  Invalid input(s): FREET3   Other results:   Imaging    No results found.   Medications:     Scheduled Medications:  amiodarone   400 mg Oral BID   apixaban   5 mg Oral BID   diclofenac  Sodium  2 g Topical QID   docusate sodium   100 mg Oral BID   empagliflozin   10 mg Oral Daily   feeding supplement  237 mL Oral BID BM   furosemide   80 mg Intravenous BID   levothyroxine   50 mcg Oral QAC breakfast   mexiletine  150 mg Oral Q12H   pantoprazole   40 mg Oral BID   potassium chloride   40 mEq Oral Daily   sodium chloride  flush  3 mL Intravenous Q12H   spironolactone   50 mg Oral Daily    Infusions:    PRN Medications: acetaminophen , sodium chloride  flush    Patient Profile  Bradley Powell is a 43 year old w/ LMNA cardiomyopathy/chronic HFrEF, VT, CKD stage IIIb, permanent AF, and obesity.    Admitted with VT storm in the  setting of electrolyte imbalance.   Assessment/Plan  1. VT Storm  -S/p ATP x6 with appropriate shock on 11/20/23 -In setting of severe hypokalemia. K and Mag low on admit.  -Electrolytes repleated. Follow K and Mg closely  - VT quiescent.  - per EP, continue amio 400 mg bid until 1/17, then reduce down to 200 mg bid until EP clinic f/u.  - continue mexitil  150 mg twice a day.    2. Acute on Chronic HFrEF, NICM, LMNA -10/2023  Echo EF 35-40%  - Volume up on exam. JVD to ear. Supar urinary response to 80 IV Lasix  BID. Increase am dose to 120 mg. Suspect will need dose of metolazone  to augment. BMP pending  - Continue Spiro 50 mg daily.  - Unable to tolerate Entresto . Had AKI and vomiting with losartan  in the past. - Continue  jardiance  10 mg daily.  - follow BMP - place TEDs    3.Hypokalemia - BMP pending, keep K > 4.0  - Continue Spiro 50 mg daily    4. CKD Stage IIIB - Creatinine baseline ~ 2-2.3.  - BMP pending - follow closely w/ diuresis    5. Permanent AF/AFL - On eliquis  5 mg twice a day.  - Rate controlled   Length of Stay: [address] PA-C  11/27/2023, 8:32 AM  Advanced Heart Failure Team Pager [phone] (M-F; 7a - 5p)  Please contact [facility] for night-coverage after hours (5p -7a ) and weekends on [email or web address]

## 2023-11-27 NOTE — Progress Notes (Addendum)
  Telemetry remains stable.   Had brief morphology change 11/25/2022 but HR only around 80. Not likely VT.   EP follow up in place. Continue Amiodarone  400 mg BID until 1/17, then down to 200 mg BID until follow up.   EP will follow from a distance.   Ozell Jodie Passey, PA-C  11/27/2023 8:14 AM

## 2023-11-27 NOTE — TOC Progression Note (Signed)
 Transition of Care [facility]) - Progression Note    Patient Details  Name: Bradley Powell MRN: [id number] Date of Birth: 02-Apr-1981  [facility]) CM/SW Contact  Arlana JINNY Moats, LCSWA Phone Number:[phone] 11/27/2023, 11:58 AM  Clinical Narrative:  HF CSW and HF NCM met with pt at bedside. Pt stated that he will need a note for work. Pt stated that he also needs some assistance filling out FMLA paperwork. CSW explained that follow up appointments will be scheduled closer to dc. Pt agrees. Family will transport pt at dc.   TOC will continue following.     Expected Discharge Plan: Home/Self Care Barriers to Discharge: Continued Medical Work up  Expected Discharge Plan and Services   Discharge Planning Services: CM Consult   Living arrangements for the past 2 months: Single Family Home                                       Social Determinants of Health (SDOH) Interventions SDOH Screenings   Food Insecurity: No Food Insecurity (11/22/2023)  Housing: Low Risk  (11/22/2023)  Transportation Needs: No Transportation Needs (11/22/2023)  Utilities: Not At Risk (11/22/2023)  Social Connections: Socially Isolated (11/22/2023)  Tobacco Use: Low Risk  (11/22/2023)    Readmission Risk Interventions     No data to display

## 2023-11-27 NOTE — Progress Notes (Signed)
  Progress Note   Patient: Bradley Powell FMW:[id number] DOB: Aug 08, 1981 DOA: 11/21/2023     6 DOS: the patient was seen and examined on 11/27/2023   Brief hospital course: 43 year old with history of chronic combined heart failure with known ejection fraction of 40%, history of PVCs, AICD, sleep apnea on CPAP history of a flutter status post ablation on Eliquis  and amiodarone , CKD stage IIIb, hypothyroidism, obesity and GERD, high dose of diuretics at home presented with last few weeks of nausea, large vomiting.  He was also notified by [employer] that he had 3 AICD discharges.  Patient did not feel any shocks.  In the emergency room he was found to be hypokalemic.  Admitted with cardiology consultation.   01/13 responding to diuresis.   Assessment and Plan: Acute on chronic systolic (congestive) heart failure (HCC) Echocardiogram with reduced LV systolic function, EF 35 to 40%, global hypokinesis, mild reduction in RV systolic function, mid MR and moderate TR,   Urine output is 2,700 ml Systolic blood pressure 100's range.   Changed to furosemide  120 mg bid.  Metolazone  5 mg  Continue with spironolactone  50 mg and empagliflozin .   Atrial fibrillation, chronic (HCC) Continue rate control with amiodarone  and mexiletin Anticoagulation with apixaban .   Acute kidney injury superimposed on chronic kidney disease (HCC) CKD stage 3b.  Hyponatremia, hypokalemia   Serum cr is 2,16 with K at 3,4 and serum bicarbonate at 24.  Na 133 and Mg 1.8   Plan to continue diuresis, follow up renal panel and electrolytes in am.  Correct K and Mg.   Hepatic cirrhosis (HCC) Compensated lived disease.   Obesity, class 2 Calculated BMI is 37.1   Hypothyroidism Continue with levothyroxine    Iron deficiency Cell count has been stable.         Subjective: Patient with improvement in edema but not back to baseline, dyspnea has improved, no orthopnea.,   Physical Exam: Vitals:    11/27/23 0422 11/27/23 0804 11/27/23 1208 11/27/23 1526  BP: 104/67 101/61 106/70 98/70  Pulse: 70 84 78 70  Resp: 18 20 18 20   Temp: 97.8 F (36.6 C) 98 F (36.7 C) 98 F (36.7 C) 97.8 F (36.6 C)  TempSrc: Oral Oral Oral Oral  SpO2: 100% 95%  100%  Weight: 112.1 kg     Height:       Neurology awake and alert ENT with mild pallor Cardiovascular with S1 and S2 present and regular with no gallops, rubs or murmurs Respiratory with no rales or wheezing, no rhonchi Abdomen with no distention Positive lower extremity edema ++  Data Reviewed:    Family Communication: no family at the bedside   Disposition: Status is: Inpatient Remains inpatient appropriate because: IV diuresis   Planned Discharge Destination: Home      Author: Elidia Toribio Furnace, MD 11/27/2023 4:23 PM  For on call review [email or web address].

## 2023-11-27 NOTE — Plan of Care (Signed)
  Problem: Education: Goal: Knowledge of General Education information will improve Description: Including pain rating scale, medication(s)/side effects and non-pharmacologic comfort measures Outcome: Progressing   Problem: Activity: Goal: Risk for activity intolerance will decrease Outcome: Progressing   Problem: Nutrition: Goal: Adequate nutrition will be maintained Outcome: Progressing   Problem: Pain Management: Goal: General experience of comfort will improve Outcome: Progressing

## 2023-11-28 ENCOUNTER — Other Ambulatory Visit (HOSPITAL_COMMUNITY): Payer: Self-pay | Admitting: Family Medicine

## 2023-11-28 DIAGNOSIS — I482 Chronic atrial fibrillation, unspecified: Secondary | ICD-10-CM | POA: Diagnosis not present

## 2023-11-28 DIAGNOSIS — N179 Acute kidney failure, unspecified: Secondary | ICD-10-CM

## 2023-11-28 DIAGNOSIS — E66812 Obesity, class 2: Secondary | ICD-10-CM

## 2023-11-28 DIAGNOSIS — E611 Iron deficiency: Secondary | ICD-10-CM

## 2023-11-28 DIAGNOSIS — K7460 Unspecified cirrhosis of liver: Secondary | ICD-10-CM

## 2023-11-28 DIAGNOSIS — I5023 Acute on chronic systolic (congestive) heart failure: Secondary | ICD-10-CM | POA: Diagnosis not present

## 2023-11-28 DIAGNOSIS — N189 Chronic kidney disease, unspecified: Secondary | ICD-10-CM

## 2023-11-28 DIAGNOSIS — E039 Hypothyroidism, unspecified: Secondary | ICD-10-CM

## 2023-11-28 LAB — CBC
HCT: 49.4 % (ref 39.0–52.0)
Hemoglobin: 15.4 g/dL (ref 13.0–17.0)
MCH: 24.8 pg — ABNORMAL LOW (ref 26.0–34.0)
MCHC: 31.2 g/dL (ref 30.0–36.0)
MCV: 79.5 fL — ABNORMAL LOW (ref 80.0–100.0)
Platelets: 316 10*3/uL (ref 150–400)
RBC: 6.21 MIL/uL — ABNORMAL HIGH (ref 4.22–5.81)
RDW: 18.1 % — ABNORMAL HIGH (ref 11.5–15.5)
WBC: 6.5 10*3/uL (ref 4.0–10.5)
nRBC: 0 % (ref 0.0–0.2)

## 2023-11-28 LAB — LIPID PANEL
Cholesterol: 213 mg/dL — ABNORMAL HIGH (ref 0–200)
HDL: 50 mg/dL (ref >40)
LDL Cholesterol: 137 mg/dL — ABNORMAL HIGH (ref 0–99)
Total CHOL/HDL Ratio: 4.3 {ratio}
Triglycerides: 131 mg/dL (ref <150)
VLDL: 26 mg/dL (ref 0–40)

## 2023-11-28 LAB — HEMOGLOBIN A1C
Hgb A1c MFr Bld: 5.4 % (ref 4.8–5.6)
Mean Plasma Glucose: 108.28 mg/dL

## 2023-11-28 LAB — BASIC METABOLIC PANEL
Anion gap: 13 (ref 5–15)
BUN: 36 mg/dL — ABNORMAL HIGH (ref 6–20)
CO2: 26 mmol/L (ref 22–32)
Calcium: 8.7 mg/dL — ABNORMAL LOW (ref 8.9–10.3)
Chloride: 94 mmol/L — ABNORMAL LOW (ref 98–111)
Creatinine, Ser: 2.17 mg/dL — ABNORMAL HIGH (ref 0.61–1.24)
GFR, Estimated: 39 mL/min — ABNORMAL LOW (ref >60)
Glucose, Bld: 137 mg/dL — ABNORMAL HIGH (ref 70–99)
Potassium: 3.9 mmol/L (ref 3.5–5.1)
Sodium: 133 mmol/L — ABNORMAL LOW (ref 135–145)

## 2023-11-28 MED ORDER — POTASSIUM CHLORIDE CRYS ER 20 MEQ PO TBCR
40.0000 meq | EXTENDED_RELEASE_TABLET | Freq: Three times a day (TID) | ORAL | Status: DC
  Administered 2023-11-28 – 2023-11-29 (×4): 40 meq via ORAL
  Filled 2023-11-28 (×4): qty 2

## 2023-11-28 MED ORDER — ATORVASTATIN CALCIUM 40 MG PO TABS
40.0000 mg | ORAL_TABLET | Freq: Every day | ORAL | Status: DC
  Administered 2023-11-28 – 2023-11-30 (×3): 40 mg via ORAL
  Filled 2023-11-28 (×3): qty 1

## 2023-11-28 MED ORDER — METOLAZONE 5 MG PO TABS
5.0000 mg | ORAL_TABLET | Freq: Once | ORAL | Status: AC
  Administered 2023-11-28: 5 mg via ORAL
  Filled 2023-11-28: qty 1

## 2023-11-28 MED ORDER — ACETAZOLAMIDE 250 MG PO TABS
500.0000 mg | ORAL_TABLET | Freq: Once | ORAL | Status: AC
  Administered 2023-11-28: 500 mg via ORAL
  Filled 2023-11-28: qty 2

## 2023-11-28 NOTE — Progress Notes (Addendum)
 Advanced Heart Failure Rounding Note  HF Cardiologist: Dr. Cherrie  Chief Complaint: Heart Failure  Subjective:    No further VT.   Diuresing. 4.3L in UOP yesterday. Wt down 3 lb.  sCr stable 2.16>>2.17   K 3.9   Sitting up on side of bed eating breakfast. Feels ok. Denies resting dyspnea. Tolerating food. No n/v.   Objective:   Weight Range: 111.1 kg Body mass index is 37.24 kg/m.   Vital Signs:   Temp:  [97.6 F (36.4 C)-98 F (36.7 C)] 97.6 F (36.4 C) (01/14 0501) Pulse Rate:  [67-84] 68 (01/14 0751) Resp:  [18-20] 19 (01/14 0501) BP: (86-112)/(61-80) 103/76 (01/14 0751) SpO2:  [95 %-100 %] 100 % (01/14 0751) Weight:  [111.1 kg] 111.1 kg (01/14 0501) Last BM Date : 11/27/23  Weight change: Filed Weights   11/26/23 0552 11/27/23 0422 11/28/23 0501  Weight: 111.9 kg 112.1 kg 111.1 kg    Intake/Output:   Intake/Output Summary (Last 24 hours) at 11/28/2023 0753 Last data filed at 11/28/2023 0600 Gross per 24 hour  Intake 1079 ml  Output 4300 ml  Net -3221 ml      Physical Exam   General:  Well appearing, mod obeseNo respiratory difficulty HEENT: normal Neck: supple. JVD 12-13 cm. Carotids 2+ bilat; no bruits. No lymphadenopathy or thyromegaly appreciated. Cor: PMI nondisplaced. Irregularly irregular rhythm and rate. No rubs, gallops or murmurs. Lungs: decreased BS at the base bilaterally  Abdomen: soft, nontender, nondistended. No hepatosplenomegaly. No bruits or masses. Good bowel sounds. Extremities: no cyanosis, clubbing, rash, 1+ b/l LE edema Neuro: alert & oriented x 3, cranial nerves grossly intact. moves all 4 extremities w/o difficulty. Affect pleasant.   Telemetry    A flutter 60s   EKG    N/A   Labs    CBC Recent Labs    11/26/23 0425 11/28/23 0238  WBC 7.7 6.5  NEUTROABS 6.0  --   HGB 15.2 15.4  HCT 48.9 49.4  MCV 80.7 79.5*  PLT 278 316   Basic Metabolic Panel Recent Labs    98/86/74 0839 11/28/23 0238  NA  133* 133*  K 3.4* 3.9  CL 96* 94*  CO2 24 26  GLUCOSE 137* 137*  BUN 35* 36*  CREATININE 2.16* 2.17*  CALCIUM  8.6* 8.7*  MG 1.8  --    Liver Function Tests Recent Labs    11/26/23 0425  AST 33  ALT 32  ALKPHOS 120  BILITOT 2.6*  PROT 6.0*  ALBUMIN 2.8*   No results for input(s): LIPASE, AMYLASE in the last 72 hours. Cardiac Enzymes No results for input(s): CKTOTAL, CKMB, CKMBINDEX, TROPONINI in the last 72 hours.  BNP: BNP (last 3 results) Recent Labs    11/22/23 0637 11/23/23 0252 11/24/23 0243  BNP 292.3* 300.3* 291.4*    ProBNP (last 3 results) No results for input(s): PROBNP in the last 8760 hours.   D-Dimer No results for input(s): DDIMER in the last 72 hours. Hemoglobin A1C Recent Labs    11/28/23 0238  HGBA1C 5.4   Fasting Lipid Panel Recent Labs    11/28/23 0238  CHOL 213*  HDL 50  LDLCALC 137*  TRIG 131  CHOLHDL 4.3   Thyroid  Function Tests No results for input(s): TSH, T4TOTAL, T3FREE, THYROIDAB in the last 72 hours.  Invalid input(s): FREET3   Other results:   Imaging    No results found.   Medications:     Scheduled Medications:  amiodarone   400 mg  Oral BID   apixaban   5 mg Oral BID   diclofenac  Sodium  2 g Topical QID   docusate sodium   100 mg Oral BID   empagliflozin   10 mg Oral Daily   feeding supplement  237 mL Oral BID BM   levothyroxine   50 mcg Oral QAC breakfast   mexiletine  150 mg Oral Q12H   pantoprazole   40 mg Oral BID   sodium chloride  flush  3 mL Intravenous Q12H   spironolactone   50 mg Oral Daily    Infusions:  furosemide  62 mL/hr at 11/27/23 2159     PRN Medications: acetaminophen , sodium chloride  flush    Patient Profile  Mr Shedd is a 43 year old w/ LMNA cardiomyopathy/chronic HFrEF, VT, CKD stage IIIb, permanent AF, and obesity.    Admitted with VT storm in the setting of electrolyte imbalance.   Assessment/Plan  1. VT Storm  -S/p ATP x6 with appropriate  shock on 11/20/23 -In setting of severe hypokalemia. K and Mag low on admit.  -Electrolytes repleated. Follow K and Mg closely  - VT quiescent.  - per EP, continue amio 400 mg bid until 1/17, then reduce down to 200 mg bid until EP clinic f/u.  - continue mexitil  150 mg twice a day.    2. Acute on Chronic HFrEF, NICM, LMNA -10/2023  Echo EF 35-40%  - Volume still up on exam but improving. Continue IV Lasix  120 mg bid today + 5 mg of metolazone  + 500 of Diamox  w/ K supp - Continue Spiro 50 mg daily.  - Unable to tolerate Entresto . Had AKI and vomiting with losartan  in the past. - Continue  jardiance  10 mg daily.  - follow BMP - cont TED hoses     3.Hypokalemia - K 3.9 today, keep K > 4.0. Supp ordered  - Continue Spiro 50 mg daily    4. CKD Stage IIIB - Creatinine baseline ~ 2-2.3.  - SCr stable, 2.17 today  - follow closely w/ diuresis    5. Permanent AF/AFL - On eliquis  5 mg twice a day.  - Rate controlled  6. HLD - LDL elevated 137 - start atorva 40 mg    Length of Stay: [address], PA-C  11/28/2023, 7:53 AM  Advanced Heart Failure Team Pager [phone] (M-F; 7a - 5p)  Please contact [facility] for night-coverage after hours (5p -7a ) and weekends on [email or web address]

## 2023-11-28 NOTE — TOC Progression Note (Signed)
 Transition of Care [facility]) - Progression Note    Patient Details  Name: Bradley Powell MRN: [id number] Date of Birth: 08/28/1981  Transition of [facility] CM/SW Contact  Arlana JINNY Nicholaus [place] Phone Number: [phone] 11/28/2023, 11:47 AM  Clinical Narrative:   HF CSW called and scheduled pts Hospital follow up appointment for Tuesday, December 05, 2023 at 2:15 PM.   [facility] will continue following.     Expected Discharge Plan: Home/Self Care Barriers to Discharge: Continued Medical Work up  Expected Discharge Plan and Services   Discharge Planning Services: CM Consult   Living arrangements for the past 2 months: Single Family Home                                       Social Determinants of Health (SDOH) Interventions SDOH Screenings   Food Insecurity: No Food Insecurity (11/22/2023)  Housing: Low Risk  (11/22/2023)  Transportation Needs: No Transportation Needs (11/22/2023)  Utilities: Not At Risk (11/22/2023)  Social Connections: Socially Isolated (11/22/2023)  Tobacco Use: Low Risk  (11/22/2023)    Readmission Risk Interventions     No data to display

## 2023-11-28 NOTE — Progress Notes (Signed)
  Progress Note   Patient: Bradley Powell FMW:[id number] DOB: 22-Nov-1980 DOA: 11/21/2023     7 DOS: the patient was seen and examined on 11/28/2023   Brief hospital course: Bradley Powell was admitted to the hospital with decompensated heart failure.   43 year old with history of chronic combined heart failure with known ejection fraction of 40%, history of PVCs, AICD, sleep apnea on CPAP history of a flutter status post ablation on Eliquis  and amiodarone , CKD stage IIIb, hypothyroidism, obesity and GERD, high dose of diuretics at home presented with last few weeks of nausea, large vomiting.  He was also notified by [employer] that he had 3 AICD discharges.  Patient did not feel any shocks.  In the emergency room he was found to be hypokalemic.   Patient was placed on aggressive diuretic regimen, sequential nephron blockade.   01/13 responding to diuresis.  01/14 improving volume status, possible discharge tomorrow.   Assessment and Plan: Acute on chronic systolic (congestive) heart failure (HCC) Echocardiogram with reduced LV systolic function, EF 35 to 40%, global hypokinesis, mild reduction in RV systolic function, mid MR and moderate TR,   Urine output is 4,300 ml Systolic blood pressure 100's range.   Changed to furosemide  120 mg bid.  Metolazone  5 mg (one dose yesterday and one today)   Acetazolamide  500 mg  (one dose yesterday and one today)  Continue with spironolactone  50 mg and empagliflozin .   Atrial fibrillation, chronic (HCC) Continue rate control with amiodarone  and mexiletin Anticoagulation with apixaban .   Acute kidney injury superimposed on chronic kidney disease (HCC) CKD stage 3b.  Hyponatremia, hypokalemia   Renal function with serum cr at 2,17 with K at 3,9 and serum bicarbonate at 26  Na 133,   Plan to continue diuresis, follow up renal panel and electrolytes in am.  Correct K and Mg.   Hepatic cirrhosis (HCC) Compensated lived disease.    Obesity, class 2 Calculated BMI is 37.1   Hypothyroidism Continue with levothyroxine    Iron deficiency Cell count has been stable.         Subjective: Patient with no chest pain, dyspnea has improved, no PND or orthopnea.   Physical Exam: Vitals:   11/28/23 0025 11/28/23 0501 11/28/23 0751 11/28/23 1139  BP: 112/80 (!) 86/70 103/76 106/73  Pulse: 67 72 68 62  Resp: 18 19  17   Temp: 97.7 F (36.5 C) 97.6 F (36.4 C)  98 F (36.7 C)  TempSrc: Oral Oral  Oral  SpO2: 100% 99% 100% 100%  Weight:  111.1 kg    Height:       Neurology awake and alert ENT with mild pallor Cardiovascular with S1 and S2 present and regular with no gallops, rubs or murmurs Respiratory with no rales or wheezing, no rhonchi  Abdomen with no distention  Positive lower extremity edema + ted hose in place  Data Reviewed:    Family Communication: no family at the bedside   Disposition: Status is: Inpatient Remains inpatient appropriate because: diuresis   Planned Discharge Destination: Home      Author: Elidia Toribio Furnace, MD 11/28/2023 1:05 PM  For on call review [email or web address].

## 2023-11-28 NOTE — Progress Notes (Signed)
   11/28/23 0025  BiPAP/CPAP/SIPAP  Reason BIPAP/CPAP not in use Non-compliant (pt refused)  BiPAP/CPAP /SiPAP Vitals  Temp 97.7 F (36.5 C)  Pulse Rate 67  Resp 18  BP 112/80  SpO2 100 %  MEWS Score/Color  MEWS Score 0  MEWS Score Color [place] Si

## 2023-11-29 DIAGNOSIS — I5023 Acute on chronic systolic (congestive) heart failure: Secondary | ICD-10-CM | POA: Diagnosis not present

## 2023-11-29 DIAGNOSIS — I472 Ventricular tachycardia, unspecified: Secondary | ICD-10-CM | POA: Diagnosis not present

## 2023-11-29 LAB — BASIC METABOLIC PANEL
Anion gap: 11 (ref 5–15)
BUN: 39 mg/dL — ABNORMAL HIGH (ref 6–20)
CO2: 28 mmol/L (ref 22–32)
Calcium: 8.9 mg/dL (ref 8.9–10.3)
Chloride: 92 mmol/L — ABNORMAL LOW (ref 98–111)
Creatinine, Ser: 2.17 mg/dL — ABNORMAL HIGH (ref 0.61–1.24)
GFR, Estimated: 39 mL/min — ABNORMAL LOW (ref >60)
Glucose, Bld: 104 mg/dL — ABNORMAL HIGH (ref 70–99)
Potassium: 3 mmol/L — ABNORMAL LOW (ref 3.5–5.1)
Sodium: 131 mmol/L — ABNORMAL LOW (ref 135–145)

## 2023-11-29 LAB — MAGNESIUM: Magnesium: 2.3 mg/dL (ref 1.7–2.4)

## 2023-11-29 MED ORDER — POTASSIUM CHLORIDE CRYS ER 20 MEQ PO TBCR
60.0000 meq | EXTENDED_RELEASE_TABLET | Freq: Three times a day (TID) | ORAL | Status: DC
  Administered 2023-11-29 – 2023-11-30 (×4): 60 meq via ORAL
  Filled 2023-11-29 (×4): qty 3

## 2023-11-29 MED ORDER — ACETAZOLAMIDE 250 MG PO TABS
500.0000 mg | ORAL_TABLET | Freq: Once | ORAL | Status: AC
  Administered 2023-11-29: 500 mg via ORAL
  Filled 2023-11-29: qty 2

## 2023-11-29 MED ORDER — POTASSIUM CHLORIDE CRYS ER 20 MEQ PO TBCR: 40.0000 meq | EXTENDED_RELEASE_TABLET | Freq: Once | ORAL | Status: DC

## 2023-11-29 MED ORDER — POTASSIUM CHLORIDE CRYS ER 20 MEQ PO TBCR
40.0000 meq | EXTENDED_RELEASE_TABLET | ORAL | Status: AC
  Administered 2023-11-29: 40 meq via ORAL
  Filled 2023-11-29: qty 2

## 2023-11-29 NOTE — Progress Notes (Signed)
 PROGRESS NOTE    Bradley Powell  ZOX:[id number] DOB: 01/06/1981 DOA: 11/21/2023 PCP: Merl Star, MD  42/M with NICM, chronic systolic CHF EF 35-40%, NICM, AICD, OSA on 6 CPAP, a flutter ablation, now permanent A-fib, CKD 3B, hypothyroidism, obesity on high-dose diuretics at home presented to the ED after being notified by [facility] about 3 AICD discharges, patient did not feel any shocks, in the ED severely hypokalemic -Heart failure team and EP consulted, electrolytes repleted, amiodarone  dose increased, started on mexiletine -Volume status improving with diuresis   Subjective: -Still some dyspnea, does not feel like he is back to baseline  Assessment and Plan:  VT storm -Suspected to be secondary to severe hypokalemia and hypomagnesemia -Replacing potassium, EP consulted, continue amiodarone  400 Mg twice daily till 1/17 then reduce to 200 Mg twice daily till EP follow-up -Continue mexiletine -BMP in a.m.  Acute on chronic systolic CHF -Echo 12/24 noted EF 35-40%, global hypokinesis, mildly reduced RV, mild MR -on status improving, heart failure team following -On Lasix  120 Mg twice daily, high-dose Aldactone  -Continue Jardiance  -Replaced K  Atrial fibrillation, permanent Continue amiodarone , apixaban   AKI on CKD 3B -Baseline creatinine 2-2.3, now stable, monitor with diuresis  Hepatic cirrhosis (HCC) Compensated liver disease.  -Lasix  and Aldactone  as above  Obesity, class 2 Calculated BMI is 37.1   Hypothyroidism Continue with levothyroxine    Iron deficiency No longer anemic  DVT prophylaxis: Apixaban  Code Status: Full code Family Communication: None present Disposition Plan: Home likely tomorrow  Consultants:    Procedures:   Antimicrobials:    Objective: Vitals:   11/28/23 2352 11/29/23 0400 11/29/23 0557 11/29/23 0705  BP: 98/73 91/63  (!) 94/54  Pulse: 64 73  61  Resp: 18 18  18   Temp: 97.6 F (36.4 C) 97.7 F (36.5 C)  98.3 F  (36.8 C)  TempSrc: Oral Oral  Oral  SpO2: 100% 100%  100%  Weight:   108.8 kg   Height:        Intake/Output Summary (Last 24 hours) at 11/29/2023 1120 Last data filed at 11/29/2023 0845 Gross per 24 hour  Intake 958 ml  Output 6900 ml  Net -5942 ml   Filed Weights   11/27/23 0422 11/28/23 0501 11/29/23 0557  Weight: 112.1 kg 111.1 kg 108.8 kg    Examination:  General exam: Appears calm and comfortable  HEENT: Positive JVD Respiratory system: Clear to auscultation Cardiovascular system: S1 & S2 heard, RRR.  Abd: nondistended, soft and nontender.Normal bowel sounds heard. Central nervous system: Alert and oriented. No focal neurological deficits. Extremities: 1+ edema Skin: No rashes Psychiatry:  Mood & affect appropriate.     Data Reviewed:   CBC: Recent Labs  Lab 11/23/23 0252 11/24/23 0243 11/25/23 0232 11/26/23 0425 11/28/23 0238  WBC 7.2 7.9 7.9 7.7 6.5  NEUTROABS 5.5 6.1 6.2 6.0  --   HGB 15.6 15.3 14.9 15.2 15.4  HCT 49.3 49.2 47.7 48.9 49.4  MCV 80.0 80.7 80.4 80.7 79.5*  PLT 299 285 273 278 316   Basic Metabolic Panel: Recent Labs  Lab 11/23/23 0252 11/23/23 1400 11/24/23 0243 11/24/23 1453 11/25/23 0232 11/26/23 0425 11/27/23 0839 11/28/23 0238 11/29/23 0315  NA 133*   < > 135   < > 132* 133* 133* 133* 131*  K 2.8*   < > 3.4*   < > 4.7 4.2 3.4* 3.9 3.0*  CL 93*   < > 96*   < > 96* 98 96* 94* 92*  CO2  29   < > 26   < > 22 20* 24 26 28   GLUCOSE 122*   < > 123*   < > 113* 148* 137* 137* 104*  BUN 32*   < > 34*   < > 41* 39* 35* 36* 39*  CREATININE 1.87*   < > 1.79*   < > 1.92* 2.06* 2.16* 2.17* 2.17*  CALCIUM  9.1   < > 9.0   < > 8.8* 8.9 8.6* 8.7* 8.9  MG 2.3  --  2.0  --  2.3  --  1.8  --  2.3  PHOS 3.2  --  3.6  --  3.4  --   --   --   --    < > = values in this interval not displayed.   GFR: Estimated Creatinine Clearance: 54.1 mL/min (A) (by C-G formula based on SCr of 2.17 mg/dL (H)). Liver Function Tests: Recent Labs  Lab  11/23/23 0252 11/24/23 0243 11/25/23 0232 11/26/23 0425  AST 38 35 47* 33  ALT 31 33 33 32  ALKPHOS 132* 129* 131* 120  BILITOT 2.7* 2.3* 2.2* 2.6*  PROT 6.6 6.1* 6.1* 6.0*  ALBUMIN 3.0* 2.8* 2.8* 2.8*   No results for input(s): "LIPASE", "AMYLASE" in the last 168 hours. No results for input(s): "AMMONIA" in the last 168 hours. Coagulation Profile: No results for input(s): "INR", "PROTIME" in the last 168 hours. Cardiac Enzymes: No results for input(s): "CKTOTAL", "CKMB", "CKMBINDEX", "TROPONINI" in the last 168 hours. BNP (last 3 results) No results for input(s): "PROBNP" in the last 8760 hours. HbA1C: Recent Labs    11/28/23 0238  HGBA1C 5.4   CBG: No results for input(s): "GLUCAP" in the last 168 hours. Lipid Profile: Recent Labs    11/28/23 0238  CHOL 213*  HDL 50  LDLCALC 137*  TRIG 131  CHOLHDL 4.3   Thyroid  Function Tests: No results for input(s): "TSH", "T4TOTAL", "FREET4", "T3FREE", "THYROIDAB" in the last 72 hours. Anemia Panel: No results for input(s): "VITAMINB12", "FOLATE", "FERRITIN", "TIBC", "IRON", "RETICCTPCT" in the last 72 hours. Urine analysis:    Component Value Date/Time   COLORURINE AMBER (A) 10/05/2023 0230   APPEARANCEUR CLEAR 10/05/2023 0230   LABSPEC 1.008 10/05/2023 0230   PHURINE 7.0 10/05/2023 0230   GLUCOSEU >=500 (A) 10/05/2023 0230   HGBUR NEGATIVE 10/05/2023 0230   BILIRUBINUR NEGATIVE 10/05/2023 0230   KETONESUR NEGATIVE 10/05/2023 0230   PROTEINUR NEGATIVE 10/05/2023 0230   NITRITE NEGATIVE 10/05/2023 0230   LEUKOCYTESUR NEGATIVE 10/05/2023 0230   Sepsis Labs: @LABRCNTIP (procalcitonin:4,lacticidven:4)  )No results found for this or any previous visit (from the past 240 hours).   Radiology Studies: No results found.   Scheduled Meds:  amiodarone   400 mg Oral BID   apixaban   5 mg Oral BID   atorvastatin   40 mg Oral Daily   diclofenac  Sodium  2 g Topical QID   empagliflozin   10 mg Oral Daily   feeding supplement   237 mL Oral BID BM   levothyroxine   50 mcg Oral QAC breakfast   mexiletine  150 mg Oral Q12H   pantoprazole   40 mg Oral BID   potassium chloride   40 mEq Oral TID   sodium chloride  flush  3 mL Intravenous Q12H   spironolactone   50 mg Oral Daily   Continuous Infusions:  furosemide  120 mg (11/29/23 0933)     LOS: 8 days    Time spent:    Deforest Fast, MD [facility]   11/29/2023,  11:20 AM

## 2023-11-29 NOTE — Progress Notes (Signed)
   11/29/23 2256  BiPAP/CPAP/SIPAP  Reason BIPAP/CPAP not in use Non-compliant (pt refused)  BiPAP/CPAP /SiPAP Vitals  Pulse Rate 66  Resp 19  SpO2 96 %

## 2023-11-29 NOTE — Progress Notes (Signed)
 Advanced Heart Failure Rounding Note  HF Cardiologist: Dr. Julane Ny  Chief Complaint: Heart Failure  Subjective:    Brisk diuresis yesterday w/ 6.9L in UOP. Wt down another 5 lb. Overall down 10 lb since admit.   SCr stable at 2.17.  K 3.0  Mg 2.3   No further VT.   He is feeling better but still feels like he is retaining fluid. Wants to stay another day for further diuresis.    Objective:   Weight Range: 108.8 kg Body mass index is 36.48 kg/m.   Vital Signs:   Temp:  [97.6 F (36.4 C)-98.3 F (36.8 C)] 98.3 F (36.8 C) (01/15 0705) Pulse Rate:  [60-73] 61 (01/15 0705) Resp:  [17-18] 18 (01/15 0705) BP: (91-106)/(54-73) 94/54 (01/15 0705) SpO2:  [100 %] 100 % (01/15 0705) Weight:  [108.8 kg] 108.8 kg (01/15 0557) Last BM Date : 11/28/23  Weight change: Filed Weights   11/27/23 0422 11/28/23 0501 11/29/23 0557  Weight: 112.1 kg 111.1 kg 108.8 kg    Intake/Output:   Intake/Output Summary (Last 24 hours) at 11/29/2023 0823 Last data filed at 11/29/2023 0600 Gross per 24 hour  Intake 600 ml  Output 6900 ml  Net -6300 ml      Physical Exam   General:  Well appearing. No respiratory difficulty HEENT: normal Neck: supple. JVD 10 cm. Carotids 2+ bilat; no bruits. No lymphadenopathy or thyromegaly appreciated. Cor: PMI nondisplaced. Regular rate & rhythm. No rubs, gallops or murmurs. Lungs: clear Abdomen: soft, nontender, nondistended. No hepatosplenomegaly. No bruits or masses. Good bowel sounds. Extremities: no cyanosis, clubbing, rash, trace b/l LE edema Neuro: alert & oriented x 3, cranial nerves grossly intact. moves all 4 extremities w/o difficulty. Affect pleasant.  Telemetry    A flutter 60s. No further VT, personally reviewed    EKG    N/A   Labs    CBC Recent Labs    11/28/23 0238  WBC 6.5  HGB 15.4  HCT 49.4  MCV 79.5*  PLT 316   Basic Metabolic Panel Recent Labs    16/10/96 0839 11/28/23 0238 11/29/23 0315  NA 133*  133* 131*  K 3.4* 3.9 3.0*  CL 96* 94* 92*  CO2 24 26 28   GLUCOSE 137* 137* 104*  BUN 35* 36* 39*  CREATININE 2.16* 2.17* 2.17*  CALCIUM  8.6* 8.7* 8.9  MG 1.8  --  2.3   Liver Function Tests No results for input(s): "AST", "ALT", "ALKPHOS", "BILITOT", "PROT", "ALBUMIN" in the last 72 hours.  No results for input(s): "LIPASE", "AMYLASE" in the last 72 hours. Cardiac Enzymes No results for input(s): "CKTOTAL", "CKMB", "CKMBINDEX", "TROPONINI" in the last 72 hours.  BNP: BNP (last 3 results) Recent Labs    11/22/23 0637 11/23/23 0252 11/24/23 0243  BNP 292.3* 300.3* 291.4*    ProBNP (last 3 results) No results for input(s): "PROBNP" in the last 8760 hours.   D-Dimer No results for input(s): "DDIMER" in the last 72 hours. Hemoglobin A1C Recent Labs    11/28/23 0238  HGBA1C 5.4   Fasting Lipid Panel Recent Labs    11/28/23 0238  CHOL 213*  HDL 50  LDLCALC 137*  TRIG 131  CHOLHDL 4.3   Thyroid  Function Tests No results for input(s): "TSH", "T4TOTAL", "T3FREE", "THYROIDAB" in the last 72 hours.  Invalid input(s): "FREET3"   Other results:   Imaging    No results found.   Medications:     Scheduled Medications:  amiodarone   400 mg  Oral BID   apixaban   5 mg Oral BID   atorvastatin   40 mg Oral Daily   diclofenac  Sodium  2 g Topical QID   docusate sodium   100 mg Oral BID   empagliflozin   10 mg Oral Daily   feeding supplement  237 mL Oral BID BM   levothyroxine   50 mcg Oral QAC breakfast   mexiletine  150 mg Oral Q12H   pantoprazole   40 mg Oral BID   potassium chloride   40 mEq Oral TID   sodium chloride  flush  3 mL Intravenous Q12H   spironolactone   50 mg Oral Daily    Infusions:  furosemide  120 mg (11/28/23 1754)     PRN Medications: acetaminophen , sodium chloride  flush    Patient Profile  Bradley Powell is a 43 year old w/ LMNA cardiomyopathy/chronic HFrEF, VT, CKD stage IIIb, permanent AF, and obesity.    Admitted with VT storm in  the setting of electrolyte imbalance.   Assessment/Plan  1. VT Storm  - S/p ATP x6 with appropriate shock on 11/20/23 - In setting of severe hypokalemia. K and Mag low on admit.  - K 3.0 today, give K supp. Mg ok at 2.3  - VT quiescent  - per EP, continue amio 400 mg bid until 1/17, then reduce down to 200 mg bid until EP clinic f/u.  - continue mexitil  150 mg twice a day.    2. Acute on Chronic HFrEF, NICM, LMNA -10/2023  Echo EF 35-40%  - Volume improving. Continue IV Lasix  120 mg bid today + 500 of Diamox  w/ K supp. Hold metolazone   - Continue Spiro 50 mg daily.  - Unable to tolerate Entresto . Had AKI and vomiting with losartan  in the past. - Continue  jardiance  10 mg daily.  - follow BMP - cont TED hoses     3.Hypokalemia - K 3.0 today, keep K > 4.0. Supp ordered. D/w pharmD - Continue Spiro 50 mg daily    4. CKD Stage IIIB - Creatinine baseline ~ 2-2.3.  - SCr stable, 2.17 today  - follow closely w/ diuresis    5. Permanent AF/AFL - On eliquis  5 mg twice a day.  - Rate controlled  6. HLD - LDL elevated 137 - started on atorva 40 mg this admit - needs f/u LP and HFTs in 6 wks    Length of Stay: [address] PA-C  11/29/2023, 8:23 AM  Advanced Heart Failure Team Pager [phone] (M-F; 7a - 5p)  Please contact [facility] for night-coverage after hours (5p -7a ) and weekends on [email or web address]

## 2023-11-29 NOTE — Plan of Care (Signed)
  Problem: Education: Goal: Knowledge of General Education information will improve Description: Including pain rating scale, medication(s)/side effects and non-pharmacologic comfort measures Outcome: Progressing   Problem: Clinical Measurements: Goal: Ability to maintain clinical measurements within normal limits will improve Outcome: Progressing   Problem: Clinical Measurements: Goal: Will remain free from infection Outcome: Progressing   Problem: Clinical Measurements: Goal: Diagnostic test results will improve Outcome: Progressing   Problem: Nutrition: Goal: Adequate nutrition will be maintained Outcome: Progressing   Problem: Coping: Goal: Level of anxiety will decrease Outcome: Progressing   Problem: Elimination: Goal: Will not experience complications related to bowel motility Outcome: Progressing

## 2023-11-30 ENCOUNTER — Other Ambulatory Visit (HOSPITAL_COMMUNITY): Payer: Self-pay

## 2023-11-30 DIAGNOSIS — I472 Ventricular tachycardia, unspecified: Secondary | ICD-10-CM | POA: Diagnosis not present

## 2023-11-30 DIAGNOSIS — I5023 Acute on chronic systolic (congestive) heart failure: Secondary | ICD-10-CM | POA: Diagnosis not present

## 2023-11-30 LAB — BASIC METABOLIC PANEL
Anion gap: 10 (ref 5–15)
BUN: 35 mg/dL — ABNORMAL HIGH (ref 6–20)
CO2: 26 mmol/L (ref 22–32)
Calcium: 8.9 mg/dL (ref 8.9–10.3)
Chloride: 97 mmol/L — ABNORMAL LOW (ref 98–111)
Creatinine, Ser: 2.17 mg/dL — ABNORMAL HIGH (ref 0.61–1.24)
GFR, Estimated: 39 mL/min — ABNORMAL LOW (ref >60)
Glucose, Bld: 86 mg/dL (ref 70–99)
Potassium: 3.1 mmol/L — ABNORMAL LOW (ref 3.5–5.1)
Sodium: 133 mmol/L — ABNORMAL LOW (ref 135–145)

## 2023-11-30 LAB — MAGNESIUM: Magnesium: 2.4 mg/dL (ref 1.7–2.4)

## 2023-11-30 MED ORDER — POTASSIUM CHLORIDE CRYS ER 20 MEQ PO TBCR
40.0000 meq | EXTENDED_RELEASE_TABLET | Freq: Two times a day (BID) | ORAL | 1 refills | Status: DC
  Filled 2023-11-30: qty 120, 30d supply, fill #0

## 2023-11-30 MED ORDER — TORSEMIDE 20 MG PO TABS
ORAL_TABLET | ORAL | 2 refills | Status: DC
  Filled 2023-11-30: qty 180, 26d supply, fill #0
  Filled 2023-11-30: qty 180, 25d supply, fill #0

## 2023-11-30 MED ORDER — ATORVASTATIN CALCIUM 40 MG PO TABS
40.0000 mg | ORAL_TABLET | Freq: Every day | ORAL | 1 refills | Status: DC
  Filled 2023-11-30 – 2024-01-03 (×2): qty 30, 30d supply, fill #0

## 2023-11-30 MED ORDER — SPIRONOLACTONE 50 MG PO TABS
50.0000 mg | ORAL_TABLET | Freq: Every day | ORAL | 1 refills | Status: DC
  Filled 2023-11-30: qty 30, 30d supply, fill #0

## 2023-11-30 MED ORDER — POTASSIUM CHLORIDE CRYS ER 20 MEQ PO TBCR
60.0000 meq | EXTENDED_RELEASE_TABLET | Freq: Once | ORAL | Status: AC
  Administered 2023-11-30: 60 meq via ORAL
  Filled 2023-11-30: qty 3

## 2023-11-30 MED ORDER — AMIODARONE HCL 200 MG PO TABS
ORAL_TABLET | ORAL | 1 refills | Status: DC
  Filled 2023-11-30: qty 60, 29d supply, fill #0

## 2023-11-30 NOTE — Progress Notes (Signed)
Advanced Heart Failure Rounding Note  HF Cardiologist: Dr. [name]  Chief Complaint: Heart Failure  Subjective:    3.5L in [facility] yesterday. Wt down another 3 lb and now back at previous hospital d/c wt.   Hypotensive this morning but asymptomatic. SBPs upper 80s on automatic cuff. Checked x 3. BMP pending.   No further VT on tele.   Feels well. Sitting up on side of bed eating breakfast. Denies orthostatic symptoms. No dyspnea.     Objective:   Weight Range: 107.1 kg Body mass index is 35.9 kg/m.   Vital Signs:   Temp:  [97.7 F (36.5 C)-98.2 F (36.8 C)] 98.2 F (36.8 C) (01/16 0733) Pulse Rate:  [61-72] 68 (01/16 0733) Resp:  [17-20] 20 (01/16 0733) BP: (85-98)/(58-72) 85/58 (01/16 0733) SpO2:  [96 %-100 %] 100 % (01/16 0733) Weight:  [107.1 kg] 107.1 kg (01/16 0509) Last BM Date : 11/28/23  Weight change: Filed Weights   11/28/23 0501 11/29/23 0557 11/30/23 0509  Weight: 111.1 kg 108.8 kg 107.1 kg    Intake/Output:   Intake/Output Summary (Last 24 hours) at 11/30/2023 0747 Last data filed at 11/29/2023 1900 Gross per 24 hour  Intake 535 ml  Output 3500 ml  Net -2965 ml      Physical Exam   General:  Well appearing, moderately obese. No respiratory difficulty HEENT: normal Neck: supple. JVD 8-9 cm. Carotids 2+ bilat; no bruits. No lymphadenopathy or thyromegaly appreciated. Cor: PMI nondisplaced. Irregularly irregular rhythm and rate. No rubs, gallops or murmurs. Lungs: decreased BS at the bases bilaterally  Abdomen: soft, nontender, nondistended. No hepatosplenomegaly. No bruits or masses. Good bowel sounds. Extremities: no cyanosis, clubbing, rash, trace b/l LEE edema Neuro: alert & oriented x 3, cranial nerves grossly intact. moves all 4 extremities w/o difficulty. Affect pleasant.   Telemetry    A flutter 60s. No VT. Personally reviewed   EKG    N/A   Labs    CBC Recent Labs    11/28/23 0238  WBC 6.5  HGB 15.4  HCT 49.4  MCV  79.5*  PLT 316   Basic Metabolic Panel Recent Labs    65/78/46 0839 11/28/23 0238 11/29/23 0315  NA 133* 133* 131*  K 3.4* 3.9 3.0*  CL 96* 94* 92*  CO2 24 26 28   GLUCOSE 137* 137* 104*  BUN 35* 36* 39*  CREATININE 2.16* 2.17* 2.17*  CALCIUM 8.6* 8.7* 8.9  MG 1.8  --  2.3   Liver Function Tests No results for input(s): "AST", "ALT", "ALKPHOS", "BILITOT", "PROT", "ALBUMIN" in the last 72 hours.  No results for input(s): "LIPASE", "AMYLASE" in the last 72 hours. Cardiac Enzymes No results for input(s): "CKTOTAL", "CKMB", "CKMBINDEX", "TROPONINI" in the last 72 hours.  BNP: BNP (last 3 results) Recent Labs    11/22/23 0637 11/23/23 0252 11/24/23 0243  BNP 292.3* 300.3* 291.4*    ProBNP (last 3 results) No results for input(s): "PROBNP" in the last 8760 hours.   D-Dimer No results for input(s): "DDIMER" in the last 72 hours. Hemoglobin A1C Recent Labs    11/28/23 0238  HGBA1C 5.4   Fasting Lipid Panel Recent Labs    11/28/23 0238  CHOL 213*  HDL 50  LDLCALC 137*  TRIG 131  CHOLHDL 4.3   Thyroid Function Tests No results for input(s): "TSH", "T4TOTAL", "T3FREE", "THYROIDAB" in the last 72 hours.  Invalid input(s): "FREET3"   Other results:   Imaging    No results found.  Medications:     Scheduled Medications:  amiodarone  400 mg Oral BID   apixaban  5 mg Oral BID   atorvastatin  40 mg Oral Daily   diclofenac Sodium  2 g Topical QID   empagliflozin  10 mg Oral Daily   feeding supplement  237 mL Oral BID BM   levothyroxine  50 mcg Oral QAC breakfast   mexiletine  150 mg Oral Q12H   pantoprazole  40 mg Oral BID   potassium chloride  60 mEq Oral TID   sodium chloride flush  3 mL Intravenous Q12H   spironolactone  50 mg Oral Daily    Infusions:  furosemide 120 mg (11/29/23 1757)     PRN Medications: acetaminophen, sodium chloride flush    Patient Profile  Mr Kenion is a 43 year old w/ LMNA cardiomyopathy/chronic HFrEF,  VT, CKD stage IIIb, permanent AF, and obesity.    Admitted with VT storm in the setting of electrolyte imbalance.   Assessment/Plan  1. VT Storm  - S/p ATP x6 with appropriate shock on 11/20/23 - In setting of severe hypokalemia. K and Mag low on admit>>repleated - VT quiescent  - per EP, continue amio 400 mg bid until 1/17, then reduce down to 200 mg bid until EP clinic f/u.  - continue mexitil 150 mg twice a day.  - Check BMP and Mg level today, supp PRN to keep K > 4.0 and Mg > 2.0    2. Acute on Chronic HFrEF, NICM, LMNA -10/2023  Echo EF 35-40%  - Diuresed well. Volume much improved and back to previous dry wt. JVD ~8 cm on exam and now hypotensive. W/ mild RVR dysfunction, will stop IV diuretics. Check BMP. Anticipate restarting PO diuretics nights.   - Continue Spiro 50 mg daily.  - Unable to tolerate Entresto. Had AKI and vomiting with losartan in the past. - Continue  jardiance 10 mg daily.  - follow BMP    3.Hypokalemia - BMP pending, supp PRN to keep K > 4.0  - Continue Spiro 50 mg daily    4. CKD Stage IIIB - Creatinine baseline ~ 2-2.3.  - SCr has been stable through admit, today's BMP pending     5. Permanent AF/AFL - On eliquis 5 mg twice a day.  - Rate controlled  6. HLD - LDL elevated 137 - started on atorva 40 mg this admit - needs f/u LP and HFTs in 6 wks    Length of Stay: [address] PA-C  11/30/2023, 7:47 AM  Advanced Heart Failure Team Pager [phone] (M-F; 7a - 5p)  Please contact [facility] for night-coverage after hours (5p -7a ) and weekends on [email or web address]

## 2023-11-30 NOTE — Discharge Summary (Signed)
Physician Discharge Summary  Bradley Powell:[id number] DOB: [birth year] DOA: 11/21/2023  PCP: [name], MD  Admit date: 11/21/2023 Discharge date: 11/30/2023  Time spent: 45 minutes  Recommendations for Outpatient Follow-up:  Advanced heart failure clinic in 1 to 2 weeks BMP on Monday 1/20 EP follow-up in 1 month  Discharge Diagnoses:  VT storm   Acute on chronic systolic (congestive) heart failure (HCC)   Atrial fibrillation, permanent   Acute kidney injury superimposed on chronic kidney disease (HCC)   Hepatic cirrhosis (HCC)   Obesity, class 2   Hypothyroidism   Iron deficiency   Discharge Condition: Improved  Diet recommendation: Low-sodium  Filed Weights   11/28/23 0501 11/29/23 0557 11/30/23 0509  Weight: 111.1 kg 108.8 kg 107.1 kg    History of present illness:  42/M with NICM, chronic systolic CHF EF 35-40%, NICM, AICD, OSA on 6 CPAP, a flutter ablation, now permanent A-fib, CKD 3B, hypothyroidism, obesity on high-dose diuretics at home presented to the ED after being notified by [facility] about 3 AICD discharges, patient did not feel any shocks, in the ED severely hypokalemic   Hospital Course:   VT storm -Suspected to be secondary to severe hypokalemia and hypomagnesemia -Replaced potassium, EP consulted, reloaded with amiodarone and mexiletine continued  -Continue amiodarone 400 Mg twice daily till 1/17 then reduce to 200 Mg twice daily till EP follow-up  Acute on chronic systolic CHF -Echo 12/24 noted EF 35-40%, global hypokinesis, mildly reduced RV, mild MR -on status improving, heart failure team following -23 L negative, euvolemic now, blood pressure soft transition to oral torsemide 80/60 Mg, high-dose Aldactone and Jardiance -Follow-up in CHF clinic in 1 to 2 weeks and BMP next week   Atrial fibrillation, permanent Continue amiodarone, apixaban, rate controlled   AKI on CKD 3B -Baseline creatinine 2-2.3, now stable   Hepatic  cirrhosis (HCC) Compensated liver disease.  -Torsemide and Aldactone as above   Obesity, class 2 Calculated BMI is 37.1    Hypothyroidism Continue with levothyroxine    Iron deficiency No longer anemic    Discharge Exam: Vitals:   11/30/23 0733 11/30/23 1209  BP: (!) 85/58 101/70  Pulse: 68 66  Resp: 20 20  Temp: 98.2 F (36.8 C) 98.3 F (36.8 C)  SpO2: 100% 100%   General exam: Appears calm and comfortable  HEENT: noJVD Respiratory system: Clear to auscultation Cardiovascular system: S1 & S2 heard, RRR.  Abd: nondistended, soft and nontender.Normal bowel sounds heard. Central nervous system: Alert and oriented. No focal neurological deficits. Extremities: Trace edema Skin: No rashes Psychiatry:  Mood & affect appropriate.   Discharge Instructions    Allergies as of 11/30/2023       Reactions   Entresto [sacubitril-valsartan] Nausea And Vomiting, Other (See Comments)   Lightheaded, fainting        Medication List     TAKE these medications    acetaminophen 500 MG tablet Commonly known as: TYLENOL Take 1,000 mg by mouth as needed for mild pain (pain score 1-3) (Leg pain).   allopurinol 100 MG tablet Commonly known as: ZYLOPRIM Take 1 tablet (100 mg total) by mouth daily. Please contact PCP for further refills   amiodarone 200 MG tablet Commonly known as: PACERONE Take 400 Mg twice a day for 1 more day and then decrease dose to 200 Mg twice daily   atorvastatin 40 MG tablet Commonly known as: LIPITOR Take 1 tablet (40 mg total) by mouth daily. Start taking on: December 01, 2023  Eliquis 5 MG Tabs tablet Generic drug: apixaban TAKE 1 TABLET BY MOUTH TWICE A DAY   esomeprazole 40 MG capsule Commonly known as: NEXIUM Take 40 mg by mouth 2 (two) times daily.   Jardiance 10 MG Tabs tablet Generic drug: empagliflozin TAKE 1 TABLET BY MOUTH EVERY DAY BEFORE BREAKFAST   levothyroxine 50 MCG tablet Commonly known as: SYNTHROID TAKE 1 TABLET (50  MCG TOTAL) BY MOUTH DAILY BEFORE BREAKFAST. TAKE 1 HOUR BEFORE BREAKFAST   metolazone 5 MG tablet Commonly known as: ZAROXOLYN Take 5 mg by mouth once a week.   mexiletine 150 MG capsule Commonly known as: MEXITIL Take 1 capsule (150 mg total) by mouth every 12 (twelve) hours.   polyethylene glycol powder 17 GM/SCOOP powder Commonly known as: GLYCOLAX/MIRALAX Take 1 CAPFUL (17 g) by mouth daily as needed.   potassium chloride SA 20 MEQ tablet Commonly known as: KLOR-CON M Take 2 tablets (40 mEq total) by mouth 2 (two) times daily. PLEASE TAKE 40 mEq (2 tabs in the morning)  and 20 mEq (1 tab in the afternoon/evening) What changed: how much to take   sodium chloride 0.65 % nasal spray Commonly known as: OCEAN Place 2 sprays into the nose as needed for congestion.   spironolactone 50 MG tablet Commonly known as: ALDACTONE Take 1 tablet (50 mg total) by mouth daily. What changed:  medication strength how much to take   torsemide 20 MG tablet Commonly known as: DEMADEX 80 mg (4 tablets in the morning) and 60 Mg (3 tablets) in the afternoon What changed:  how much to take how to take this when to take this additional instructions   UNABLE TO FIND Referral to [facility] -Please accept this patient as a referral for CKD 4 and diastolic CHF  Zannie Cove MD [facility]   Voltaren 1 % Gel Generic drug: diclofenac Sodium Apply 2 g topically daily as needed (pain).       Allergies  Allergen Reactions   Entresto [Sacubitril-Valsartan] Nausea And Vomiting and Other (See Comments)    Lightheaded, fainting    Follow-up Information     [name], MD. Go in 6 day(s).   Specialty: Internal Medicine Why: Hospital follow up appointment scheduled for Thursday, November 30, 2023 at 10 AM.  PLEASE ARRIVE 10-15 minutes early.  PLEASE call and cancel/reschedule if you CANNOT make appointment. Contact information: [address] [place] [address] [phone]         [name], MD. Go in 5 day(s).   Specialty: Internal Medicine Why: Hospital follow up appointment scheduled for Tuesday, December 05, 2023 at 2:15 PM.  PLEASE ARRIVE 10-15 minutes early.  PLEASE call and cancel/reschedule if you CANNOT make appointment. Contact information: [address] [place] [address] [phone]                  The results of significant diagnostics from this hospitalization (including imaging, microbiology, ancillary and laboratory) are listed below for reference.    Significant Diagnostic Studies: CUP PACEART REMOTE DEVICE CHECK Result Date: 11/24/2023 Scheduled remote reviewed. Normal device function.  Device alert for sustained VT/VF with successful therapy 1/6 Event occurred 1/6 @ 18:19, duration 26sec, HR 212.  EGM c/w sustained VT/VF falling into the VT-1 zone.  ATP delivered x6, followed by 31J of HV therapy converting arrhythmia Pt was instructed to go to the ED, admitted 1/7 hypokalemia 3 additional NSVT events since last transmission, no  therapy Next remote 91 days. LA, CVRS  DG Chest 1 View Result Date: 11/21/2023 CLINICAL DATA:  Near syncopal episode with defibrillator firing. EXAM: CHEST  1 VIEW COMPARISON:  Chest radiograph dated 10/09/2023 FINDINGS: Lines/tubes: Left chest wall ICD lead projects over the right ventricle. Lungs: Well inflated lungs. No focal consolidation. Pleura: No pneumothorax or pleural effusion. Heart/mediastinum: Similar mildly enlarged cardiomediastinal silhouette. Bones: No acute osseous abnormality. IMPRESSION: 1.  No focal consolidations. 2. Similar mild cardiomegaly. Electronically Signed   By: Agustin Cree M.D.   On: 11/21/2023 13:35    Microbiology: No results found for this or any previous visit (from the past 240 hours).   Labs: Basic Metabolic Panel: Recent Labs  Lab 11/24/23 0243 11/24/23 1453 11/25/23 0232 11/26/23 0425  11/27/23 0839 11/28/23 0238 11/29/23 0315 11/30/23 0950  NA 135   < > 132* 133* 133* 133* 131* 133*  K 3.4*   < > 4.7 4.2 3.4* 3.9 3.0* 3.1*  CL 96*   < > 96* 98 96* 94* 92* 97*  CO2 26   < > 22 20* 24 26 28 26   GLUCOSE 123*   < > 113* 148* 137* 137* 104* 86  BUN 34*   < > 41* 39* 35* 36* 39* 35*  CREATININE 1.79*   < > 1.92* 2.06* 2.16* 2.17* 2.17* 2.17*  CALCIUM 9.0   < > 8.8* 8.9 8.6* 8.7* 8.9 8.9  MG 2.0  --  2.3  --  1.8  --  2.3 2.4  PHOS 3.6  --  3.4  --   --   --   --   --    < > = values in this interval not displayed.   Liver Function Tests: Recent Labs  Lab 11/24/23 0243 11/25/23 0232 11/26/23 0425  AST 35 47* 33  ALT 33 33 32  ALKPHOS 129* 131* 120  BILITOT 2.3* 2.2* 2.6*  PROT 6.1* 6.1* 6.0*  ALBUMIN 2.8* 2.8* 2.8*   No results for input(s): "LIPASE", "AMYLASE" in the last 168 hours. No results for input(s): "AMMONIA" in the last 168 hours. CBC: Recent Labs  Lab 11/24/23 0243 11/25/23 0232 11/26/23 0425 11/28/23 0238  WBC 7.9 7.9 7.7 6.5  NEUTROABS 6.1 6.2 6.0  --   HGB 15.3 14.9 15.2 15.4  HCT 49.2 47.7 48.9 49.4  MCV 80.7 80.4 80.7 79.5*  PLT 285 273 278 316   Cardiac Enzymes: No results for input(s): "CKTOTAL", "CKMB", "CKMBINDEX", "TROPONINI" in the last 168 hours. BNP: BNP (last 3 results) Recent Labs    11/22/23 0637 11/23/23 0252 11/24/23 0243  BNP 292.3* 300.3* 291.4*    ProBNP (last 3 results) No results for input(s): "PROBNP" in the last 8760 hours.  CBG: No results for input(s): "GLUCAP" in the last 168 hours.     Signed:  Zannie Cove MD.  [facility] 11/30/2023, 12:48 PM

## 2023-11-30 NOTE — TOC Initial Note (Signed)
Transition of Care [facility]) - Initial/Assessment Note    Patient Details  Name: Bradley Powell MRN: [id number] Date of Birth: [birth year]  [facility] CM/SW Contact:    Elliot Cousin, RN Phone Number: [phone] 11/30/2023, 2:54 PM  Clinical Narrative:                 Patient note for work emailed [email or web address], pt has scale at home for daily weights. Discussed heart healthy and low sodium diet. Family will provide transportation home.   Expected Discharge Plan: Home/Self Care Barriers to Discharge: No Barriers Identified   Patient Goals and CMS Choice Patient states their goals for this hospitalization and ongoing recovery are:: return home          Expected Discharge Plan and Services   Discharge Planning Services: CM Consult   Living arrangements for the past 2 months: Single Family Home Expected Discharge Date: 11/30/23                                    Prior Living Arrangements/Services Living arrangements for the past 2 months: Single Family Home   Patient language and need for interpreter reviewed:: Yes Do you feel safe going back to the place where you live?: Yes      Need for Family Participation in Patient Care: No (Comment) Care giver support system in place?: No (comment)   Criminal Activity/Legal Involvement Pertinent to Current Situation/Hospitalization: No - Comment as needed  Activities of Daily Living   ADL Screening (condition at time of admission) Independently performs ADLs?: Yes (appropriate for developmental age) Is the patient deaf or have difficulty hearing?: No Does the patient have difficulty seeing, even when wearing glasses/contacts?: No Does the patient have difficulty concentrating, remembering, or making decisions?: No  Permission Sought/Granted Permission sought to share information with : Case Manager, Family Supports, PCP Permission granted to share information with : Yes, Verbal  Permission Granted  Share Information with NAME: Rudolph Seliger     Permission granted to share info w Relationship: mother  Permission granted to share info w Contact Information: [phone]  Emotional Assessment Appearance:: Appears stated age Attitude/Demeanor/Rapport: Engaged Affect (typically observed): Accepting Orientation: : Oriented to Self, Oriented to Place, Oriented to  Time, Oriented to Situation   Psych Involvement: No (comment)  Admission diagnosis:  Hypokalemia [E87.6] Ventricular tachycardia (HCC) [I47.20] Patient Active Problem List   Diagnosis Date Noted   Hypokalemia 11/21/2023   Acute kidney injury superimposed on chronic kidney disease (HCC) 10/13/2023   Leukocytosis 10/13/2023   Hypothyroidism 10/13/2023   Iron deficiency 10/13/2023   Acute on chronic congestive heart failure (HCC) 09/30/2023   Fluid overload 09/29/2023   Ventricular tachycardia (HCC) 05/26/2022   ICD (implantable cardioverter-defibrillator) in place 05/26/2022   Syncope and collapse 02/15/2022   Acute on chronic systolic (congestive) heart failure (HCC) 02/15/2022   Hepatic cirrhosis (HCC) 06/16/2020   Dehydration with hyponatremia 06/16/2020   Hypotension due to hypovolemia 06/16/2020   Intractable nausea and vomiting 06/16/2020   Acute kidney injury (HCC) 06/15/2020   Atrial fibrillation, chronic (HCC) 04/29/2020   Chronic combined systolic and diastolic congestive heart failure (HCC) 04/07/2018   NICM (nonischemic cardiomyopathy) (HCC) 04/07/2018   Hyperglycemia 04/07/2018   Typical atrial flutter (HCC) 04/06/2018   Paroxysmal atrial flutter (HCC) 04/06/2018   Gout 03/30/2017   Suspected sleep apnea 03/01/2017   Edema 02/24/2017   Testicular swelling 02/24/2017  CHF (congestive heart failure) (HCC) 02/24/2017   Overweight 02/24/2017   Costochondritis 02/24/2017   Sinus tachycardia 02/24/2017   First degree AV block 02/24/2017   Scrotal edema 02/24/2017   Obesity, class  2 02/24/2017   PCP:  [name], MD Pharmacy:   [phone] [address], [place] - [address] [place] [address] Phone: [phone] Fax: [phone]  [facility] [phone] - [place], [place] - [address] [place] [phone] Phone: [phone] Fax: [phone]  [facility] [address] [place] [address] Phone: [phone] Fax: [phone]  [facility] - [place], [place] - [address] [place] [address] Phone: [phone] Fax: [phone]     Social Drivers of Health (SDOH) Social History: SDOH Screenings   Food Insecurity: No Food Insecurity (11/22/2023)  Housing: Low Risk  (11/22/2023)  Transportation Needs: No Transportation Needs (11/22/2023)  Utilities: Not At Risk (11/22/2023)  Social Connections: Socially Isolated (11/22/2023)  Tobacco Use: Low Risk  (11/22/2023)   SDOH Interventions:     Readmission Risk Interventions    11/30/2023    2:37 PM  Readmission Risk Prevention Plan  Transportation Screening Complete  PCP or Specialist Appt within 5-7 Days Complete  Home Care Screening Complete  Medication Review (RN CM) Complete

## 2023-12-04 ENCOUNTER — Ambulatory Visit (HOSPITAL_COMMUNITY)
Admit: 2023-12-04 | Discharge: 2023-12-04 | Disposition: A | Discharge status: Home / Self Care | Payer: Medicaid Other | Attending: Cardiology | Admitting: Cardiology

## 2023-12-04 DIAGNOSIS — I5042 Chronic combined systolic (congestive) and diastolic (congestive) heart failure: Secondary | ICD-10-CM | POA: Diagnosis not present

## 2023-12-04 LAB — BASIC METABOLIC PANEL
Anion gap: 12 (ref 5–15)
BUN: 42 mg/dL — ABNORMAL HIGH (ref 6–20)
CO2: 29 mmol/L (ref 22–32)
Calcium: 9.9 mg/dL (ref 8.9–10.3)
Chloride: 93 mmol/L — ABNORMAL LOW (ref 98–111)
Creatinine, Ser: 2.21 mg/dL — ABNORMAL HIGH (ref 0.61–1.24)
GFR, Estimated: 38 mL/min — ABNORMAL LOW (ref >60)
Glucose, Bld: 78 mg/dL (ref 70–99)
Potassium: 2.9 mmol/L — ABNORMAL LOW (ref 3.5–5.1)
Sodium: 134 mmol/L — ABNORMAL LOW (ref 135–145)

## 2023-12-05 ENCOUNTER — Telehealth: Payer: Self-pay | Admitting: Cardiology

## 2023-12-05 DIAGNOSIS — E876 Hypokalemia: Secondary | ICD-10-CM | POA: Diagnosis not present

## 2023-12-05 DIAGNOSIS — R252 Cramp and spasm: Secondary | ICD-10-CM | POA: Diagnosis not present

## 2023-12-05 DIAGNOSIS — N179 Acute kidney failure, unspecified: Secondary | ICD-10-CM | POA: Diagnosis not present

## 2023-12-05 NOTE — Telephone Encounter (Signed)
Pt requesting a cb to discuss having a sleep study done

## 2023-12-05 NOTE — Telephone Encounter (Signed)
Call to patient to discuss his concerns about a sleep study. No answer, left message per DPR asking patient to call our office to discuss.

## 2023-12-07 ENCOUNTER — Telehealth (HOSPITAL_COMMUNITY): Payer: Self-pay | Admitting: Cardiology

## 2023-12-07 DIAGNOSIS — I5042 Chronic combined systolic (congestive) and diastolic (congestive) heart failure: Secondary | ICD-10-CM

## 2023-12-07 NOTE — Telephone Encounter (Signed)
-----   Message from Anderson [place] sent at 12/04/2023  4:49 PM EST ----- K is too low. Please call and confirm he is taking spiro 50 daily and KCL 40 bid

## 2023-12-07 NOTE — Telephone Encounter (Signed)
Patient called.  Patient aware.] Reports compliance with spiro 50 daily and KCL 40 BID Will increase KCL to 60 TID x 2 days then resume 40 BID Repeat labs 1/27

## 2023-12-10 ENCOUNTER — Other Ambulatory Visit (HOSPITAL_COMMUNITY): Payer: Self-pay | Admitting: Internal Medicine

## 2023-12-11 ENCOUNTER — Ambulatory Visit (HOSPITAL_COMMUNITY)
Admission: RE | Admit: 2023-12-11 | Discharge: 2023-12-11 | Disposition: A | Discharge status: Home / Self Care | Payer: Medicaid Other | Source: Ambulatory Visit | Attending: Cardiology | Admitting: Cardiology

## 2023-12-11 DIAGNOSIS — I509 Heart failure, unspecified: Secondary | ICD-10-CM | POA: Diagnosis not present

## 2023-12-11 DIAGNOSIS — I5042 Chronic combined systolic (congestive) and diastolic (congestive) heart failure: Secondary | ICD-10-CM | POA: Diagnosis not present

## 2023-12-11 DIAGNOSIS — N2581 Secondary hyperparathyroidism of renal origin: Secondary | ICD-10-CM | POA: Diagnosis not present

## 2023-12-11 DIAGNOSIS — D631 Anemia in chronic kidney disease: Secondary | ICD-10-CM | POA: Diagnosis not present

## 2023-12-11 DIAGNOSIS — N1832 Chronic kidney disease, stage 3b: Secondary | ICD-10-CM | POA: Diagnosis not present

## 2023-12-11 LAB — BASIC METABOLIC PANEL
Anion gap: 15 (ref 5–15)
BUN: 35 mg/dL — ABNORMAL HIGH (ref 6–20)
CO2: 26 mmol/L (ref 22–32)
Calcium: 9.1 mg/dL (ref 8.9–10.3)
Chloride: 94 mmol/L — ABNORMAL LOW (ref 98–111)
Creatinine, Ser: 1.85 mg/dL — ABNORMAL HIGH (ref 0.61–1.24)
GFR, Estimated: 47 mL/min — ABNORMAL LOW (ref >60)
Glucose, Bld: 187 mg/dL — ABNORMAL HIGH (ref 70–99)
Potassium: 3.2 mmol/L — ABNORMAL LOW (ref 3.5–5.1)
Sodium: 135 mmol/L (ref 135–145)

## 2023-12-12 ENCOUNTER — Ambulatory Visit: Payer: Medicaid Other | Admitting: Student

## 2023-12-14 ENCOUNTER — Telehealth (HOSPITAL_COMMUNITY): Payer: Self-pay | Admitting: *Deleted

## 2023-12-14 ENCOUNTER — Other Ambulatory Visit (HOSPITAL_COMMUNITY): Payer: Self-pay

## 2023-12-14 ENCOUNTER — Telehealth (HOSPITAL_COMMUNITY): Payer: Self-pay

## 2023-12-14 NOTE — Telephone Encounter (Signed)
Called patient per Prince Rome, NP with following lab results and instructions:  "K improving but still low. Increase KCL to 40 tid x 3 days, then back to 40 bid. Will repeat labs at follow up with DB on 12/19/23"  Pt verbalized understanding of same.

## 2023-12-14 NOTE — Telephone Encounter (Signed)
Prior auth denied due to primary coverage listed. Spoke with patient, he is working to resolve ongoing issues with Community education officer.  I will contact the pharmacy if needed, once coverage is reactivated.

## 2023-12-14 NOTE — Telephone Encounter (Signed)
Advanced Heart Failure Patient Advocate Encounter  Received notification from pharmacy that prior auth is required for Jardiance. Prior auth submitted (CMM Key [id number]) was denied due to primary coverage on record.  Spoke to patient by phone, there is a Research scientist (medical), however, he is working to resolve an ongoing issue with Community education officer. Premium has been paid, but coverage is not currently listed as active. Patient is hoping to have this resolved within the next few days.  I will continue to follow up.  Burnell Blanks, CPhT Rx Patient Advocate Phone: [phone]

## 2023-12-18 ENCOUNTER — Other Ambulatory Visit (HOSPITAL_COMMUNITY): Payer: Self-pay

## 2023-12-19 ENCOUNTER — Ambulatory Visit (HOSPITAL_COMMUNITY)
Admission: RE | Admit: 2023-12-19 | Discharge: 2023-12-19 | Disposition: A | Discharge status: Home / Self Care | Payer: Medicaid Other | Source: Ambulatory Visit | Attending: Internal Medicine | Admitting: Internal Medicine

## 2023-12-19 VITALS — BP 92/60 | HR 71 | Wt 251.8 lb

## 2023-12-19 DIAGNOSIS — I472 Ventricular tachycardia, unspecified: Secondary | ICD-10-CM | POA: Diagnosis not present

## 2023-12-19 DIAGNOSIS — Z7901 Long term (current) use of anticoagulants: Secondary | ICD-10-CM | POA: Diagnosis not present

## 2023-12-19 DIAGNOSIS — I482 Chronic atrial fibrillation, unspecified: Secondary | ICD-10-CM | POA: Diagnosis not present

## 2023-12-19 DIAGNOSIS — M109 Gout, unspecified: Secondary | ICD-10-CM | POA: Insufficient documentation

## 2023-12-19 DIAGNOSIS — I5022 Chronic systolic (congestive) heart failure: Secondary | ICD-10-CM | POA: Insufficient documentation

## 2023-12-19 DIAGNOSIS — N183 Chronic kidney disease, stage 3 unspecified: Secondary | ICD-10-CM | POA: Diagnosis not present

## 2023-12-19 DIAGNOSIS — I459 Conduction disorder, unspecified: Secondary | ICD-10-CM | POA: Diagnosis not present

## 2023-12-19 DIAGNOSIS — I4891 Unspecified atrial fibrillation: Secondary | ICD-10-CM

## 2023-12-19 DIAGNOSIS — E039 Hypothyroidism, unspecified: Secondary | ICD-10-CM | POA: Diagnosis not present

## 2023-12-19 DIAGNOSIS — Z6838 Body mass index (BMI) 38.0-38.9, adult: Secondary | ICD-10-CM | POA: Insufficient documentation

## 2023-12-19 DIAGNOSIS — G4733 Obstructive sleep apnea (adult) (pediatric): Secondary | ICD-10-CM | POA: Diagnosis not present

## 2023-12-19 DIAGNOSIS — I130 Hypertensive heart and chronic kidney disease with heart failure and stage 1 through stage 4 chronic kidney disease, or unspecified chronic kidney disease: Secondary | ICD-10-CM | POA: Diagnosis not present

## 2023-12-19 DIAGNOSIS — Z7984 Long term (current) use of oral hypoglycemic drugs: Secondary | ICD-10-CM | POA: Diagnosis not present

## 2023-12-19 DIAGNOSIS — I493 Ventricular premature depolarization: Secondary | ICD-10-CM | POA: Diagnosis not present

## 2023-12-19 DIAGNOSIS — Z95810 Presence of automatic (implantable) cardiac defibrillator: Secondary | ICD-10-CM | POA: Diagnosis not present

## 2023-12-19 DIAGNOSIS — Z79899 Other long term (current) drug therapy: Secondary | ICD-10-CM | POA: Insufficient documentation

## 2023-12-19 DIAGNOSIS — I5082 Biventricular heart failure: Secondary | ICD-10-CM | POA: Insufficient documentation

## 2023-12-19 DIAGNOSIS — N1832 Chronic kidney disease, stage 3b: Secondary | ICD-10-CM | POA: Diagnosis not present

## 2023-12-19 LAB — COMPREHENSIVE METABOLIC PANEL
ALT: 32 U/L (ref 0–44)
AST: 39 U/L (ref 15–41)
Albumin: 2.7 g/dL — ABNORMAL LOW (ref 3.5–5.0)
Alkaline Phosphatase: 99 U/L (ref 38–126)
Anion gap: 11 (ref 5–15)
BUN: 32 mg/dL — ABNORMAL HIGH (ref 6–20)
CO2: 32 mmol/L (ref 22–32)
Calcium: 9 mg/dL (ref 8.9–10.3)
Chloride: 94 mmol/L — ABNORMAL LOW (ref 98–111)
Creatinine, Ser: 2.34 mg/dL — ABNORMAL HIGH (ref 0.61–1.24)
GFR, Estimated: 35 mL/min — ABNORMAL LOW (ref >60)
Glucose, Bld: 97 mg/dL (ref 70–99)
Potassium: 4.2 mmol/L (ref 3.5–5.1)
Sodium: 137 mmol/L (ref 135–145)
Total Bilirubin: 2 mg/dL — ABNORMAL HIGH (ref 0.0–1.2)
Total Protein: 5.3 g/dL — ABNORMAL LOW (ref 6.5–8.1)

## 2023-12-19 LAB — TSH: TSH: 21.359 u[IU]/mL — ABNORMAL HIGH (ref 0.350–4.500)

## 2023-12-19 LAB — T4, FREE: Free T4: 0.58 ng/dL — ABNORMAL LOW (ref 0.61–1.12)

## 2023-12-19 LAB — BRAIN NATRIURETIC PEPTIDE: B Natriuretic Peptide: 256.2 pg/mL — ABNORMAL HIGH (ref 0.0–100.0)

## 2023-12-19 MED ORDER — METOLAZONE 5 MG PO TABS: 5.0000 mg | ORAL_TABLET | ORAL | 3 refills | Status: DC

## 2023-12-19 MED ORDER — POTASSIUM CHLORIDE CRYS ER 20 MEQ PO TBCR: 40.0000 meq | EXTENDED_RELEASE_TABLET | Freq: Two times a day (BID) | ORAL | 1 refills | Status: DC

## 2023-12-19 NOTE — Patient Instructions (Signed)
 Great to see you today!!!  Medication Changes:  Increase Metolazone  to 2.5 mg every Sunday and Wednesday Take an extra 40 meq (2 tabs) of Potassium when you take Metolazone   Lab Work:  Labs done today, your results will be available in MyChart, we will contact you for abnormal readings.  Your physician recommends that you return for lab work in: 2 weeks  Special Instructions // Education:  Do the following things EVERYDAY: Weigh yourself in the morning before breakfast. Write it down and keep it in a log. Take your medicines as prescribed Eat low salt foods--Limit salt (sodium) to 2000 mg per day.  Stay as active as you can everyday Limit all fluids for the day to less than 2 liters   Follow-Up in: 1 month   At the [facility] Failure [facility], you and your health needs are our priority. We have a designated team specialized in the treatment of Heart Failure. This Care Team includes your primary Heart Failure Specialized Cardiologist (physician), Advanced Practice Providers (APPs- Physician Assistants and Nurse Practitioners), and Pharmacist who all work together to provide you with the care you need, when you need it.   You may see any of the following providers on your designated Care Team at your next follow up:  Dr. Toribio Fuel Dr. Ezra Shuck Dr. Ria Commander Dr. Odis Brownie Greig Mosses, NP Caffie Shed, [place] [facility] [place], [place] Beckey Coe, NP Jordan Lee, NP Tinnie Redman, PharmD   Please be sure to bring in all your medications bottles to every appointment.   Need to Contact Us :  If you have any questions or concerns before your next appointment please send us  a message through [place] or call our office at [phone].    TO LEAVE A MESSAGE FOR THE NURSE SELECT OPTION 2, PLEASE LEAVE A MESSAGE INCLUDING: YOUR NAME DATE OF BIRTH CALL BACK NUMBER REASON FOR CALL**this is important as we prioritize the call backs  YOU WILL RECEIVE  A CALL BACK THE SAME DAY AS LONG AS YOU CALL BEFORE 4:00 PM

## 2023-12-19 NOTE — Addendum Note (Signed)
Encounter addended by: Noralee Space, RN on: 12/19/2023 3:08 PM  Actions taken: Diagnosis association updated, Pharmacy for encounter modified, Order list changed, Clinical Note Signed, Charge Capture section accepted

## 2023-12-19 NOTE — Progress Notes (Addendum)
 Advanced Heart Failure Clinic Note   PCP:  Rexanne Ingle, MD  GI : Dr Lucian Ee.  HF Cardiologist: Dr. Cherrie  Chief Complaint: f/u for chronic biventricular heart failure    HPI: Bradley Powell is a 43 y.o. male with a history of combined systolic/diastolic heart failure, obesity, HTN, and gout.   Admitted 4/18 with marked volume overload. EF 40-45%. Diuresed with IV lasix  tid + metolazone . Had RHC/LHC as noted below. Overall diuresed 45 pounds. Discharge weight was 281 pounds.    AFL ablation on 03/2018. Subsequently had marked 1st AV block with progression to junctional rhythm. Saw Dr. Waddell who wanted to follow it. Suggested cutting back carvedilol .  Previously Bidil  stopped due to hypotension.  Admitted  8/21 with intractable N/V of unclear etiology, hypotension and AKI. Creatinine peaked at 4.5. Back to 1.6 at d/c. Has been followed by GI.   cMRI 7/21 LVEF 45% suspect infiltrative CM. ECV 55%  Volume overloaded at clinic f/u 6/22, weight up 50 lbs. Misunderstood metolazone  directions and took for 5 days. Weight down 65 lbs and required IVF in clinic.  Admitted 4/23 with VT. Had massive volume overload on exam. Had recurrent sustained VT w/ rates 200-210, converted with IV amio and moved to ICU. Echo EF 35-40% RV modHK.SABRA Diuresed over 50 pounds with IV lasix . Underwent [employer] ICD implant. IV amio switched to po (already on mexilitene for PVCs). 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   In 9/23, weight up 20 lbs. ReDs 44%. Prescribed Furoscix  80 mg daily + 40 extra KCL daily x 3 days. Instructed to restart torsemide  40 mg thereafter. Remained volume up at follow up 10/23. Torsemide  increased to 40 bid and added metolazone  2.5 daily x 3 days.  Admitted for 3 weeks 11/24-12/24 diuresed 70 pounds on high-dose lasix   Readmitted 17/25 with VT storm in setting of hypokalemia  and recurrent volume overload. Loaded amio. D/c weight 236 pounds.   Here for post  hospital f/u. Says weight is up a few pounds. Weight ~240-243. He is 251 on our scale today. Says his breathing is fine. Taking torsemide  80/60 + metolazone  on Sundays. Urine output picks up at night and when he lies down.     Cardiac Studies - Echo (4/23): EF 35-40%, moderate LV dysfunction, RV moderately reduced, mild to moderate MR, moderate TR, posterior pericardial effusion  - cMRI (7/21): 1. Findings suggestive of infiltrative cardiomyopathy. Native T1 ECV 55%, with diffuse post contrast delayed myocardial enhancement and abnormal T1 myocardial nulling kinetics, consistent with a diagnosis of cardiac amyloidosis. 2.  Upper limit of normal biventricular chamber size. 3. Mildly reduced biventricular systolic function. LVEF 45%, RVEF 44%. 4. Cannot exclude possible anomalous pulmonary venous drainage in to the mid SVC. Seen on axial HASTE image series 3 image 4, but evaluation limited due to poor spatial resolution. Consider ECG gated CTA chest for further evaluation.  - Echo (2/21): EF 40-45%  - Echo (1/20): EF 35-40% - Echo (3/19): EF 45-50%. - Echo (9/18): EF ~25-30%.  - Holter (7/18): 6% PVCs - Sleep study (10/18): AHI 26/hr  - RHC (2/21): RA = 14 RV = 43/15 PA = 43/21 (31) PCW = 15 Fick cardiac output/index = 5.7/2.4 PVR = 2.8 WU Ao sat = 97% PA sat = 73%, 75% SVC sat = 67%   - R/LHC (4/18): AO = 109/86 (97) LV =  101/28 RA =  29 RV = 50/22 PA = 55/24 (38) PCW = 26 Fick cardiac  output/index = 6.4/2.6 Thermal CO/CI = 4.9/2.0 PVR = 2.5 WU FA sat = 98% PA sat = 69%, 71% SVC sat = 82%, 82% IVC sat = 66%   1. Normal coronary arteries  2. Mild to moderate pulmonary HTN with normal PVR 3. Biventricular volume overload with R > L heart failure 4. Moderately decreased CO by thermodilution  5. 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 No intra-cardiac shunt  Past Medical History:   Diagnosis Date   Atrial flutter (HCC)    a. s/p ablation 03/2018.   Chronic combined systolic and diastolic CHF (congestive heart failure) (HCC)    History of esophagogastroduodenoscopy (EGD)    Morbid obesity (HCC)    NICM (nonischemic cardiomyopathy) (HCC)    OSA on CPAP    mild OSA with an AHI of 7.7/hr on auto CPAP   PVC's (premature ventricular contractions)    Past Surgical History:  Procedure Laterality Date   A-FLUTTER ABLATION N/A 04/06/2018   Procedure: A-FLUTTER ABLATION;  Surgeon: Waddell Danelle ORN, MD;  Location: [facility] INVASIVE CV LAB;  Service: Cardiovascular;  Laterality: N/A;   BIOPSY  07/11/2023   Procedure: BIOPSY;  Surgeon: Elicia Claw, MD;  Location: [facility] ENDOSCOPY;  Service: Gastroenterology;;   ESOPHAGEAL BRUSHING  06/19/2020   Procedure: ESOPHAGEAL BRUSHING;  Surgeon: Dianna Specking, MD;  Location: [facility] ENDOSCOPY;  Service: Endoscopy;;   ESOPHAGOGASTRODUODENOSCOPY (EGD) WITH PROPOFOL  Left 06/19/2020   Procedure: ESOPHAGOGASTRODUODENOSCOPY (EGD) WITH PROPOFOL ;  Surgeon: Dianna Specking, MD;  Location: [facility] ENDOSCOPY;  Service: Endoscopy;  Laterality: Left;   ESOPHAGOGASTRODUODENOSCOPY (EGD) WITH PROPOFOL  N/A 07/11/2023   Procedure: ESOPHAGOGASTRODUODENOSCOPY (EGD) WITH PROPOFOL ;  Surgeon: Elicia Claw, MD;  Location: [facility] ENDOSCOPY;  Service: Gastroenterology;  Laterality: N/A;   ICD IMPLANT N/A 02/22/2022   Procedure: ICD IMPLANT;  Surgeon: Waddell Danelle ORN, MD;  Location: [facility] INVASIVE CV LAB;  Service: Cardiovascular;  Laterality: N/A;   RIGHT HEART CATH N/A 12/20/2019   Procedure: RIGHT HEART CATH;  Surgeon: Cherrie Toribio SAUNDERS, MD;  Location: [facility] INVASIVE CV LAB;  Service: Cardiovascular;  Laterality: N/A;   RIGHT/LEFT HEART CATH AND CORONARY ANGIOGRAPHY N/A 03/01/2017   Procedure: Right/Left Heart Cath and Coronary Angiography;  Surgeon: Toribio SAUNDERS Cherrie, MD;  Location: [facility] INVASIVE CV LAB;  Service: Cardiovascular;  Laterality: N/A;   Current Outpatient  Medications  Medication Sig Dispense Refill   acetaminophen  (TYLENOL ) 500 MG tablet Take 1,000 mg by mouth as needed for mild pain (pain score 1-3) (Leg pain).     allopurinol  (ZYLOPRIM ) 100 MG tablet Take 1 tablet (100 mg total) by mouth daily. Please contact PCP for further refills     amiodarone  (PACERONE ) 200 MG tablet Take 200 mg by mouth 2 (two) times daily.     atorvastatin  (LIPITOR) 40 MG tablet Take 1 tablet (40 mg total) by mouth daily. 30 tablet 1   diclofenac  Sodium (VOLTAREN ) 1 % GEL Apply 2 g topically daily as needed (pain).     ELIQUIS  5 MG TABS tablet TAKE 1 TABLET BY MOUTH TWICE A DAY 60 tablet 11   esomeprazole  (NEXIUM ) 40 MG capsule Take 40 mg by mouth 2 (two) times daily.     JARDIANCE  10 MG TABS tablet TAKE 1 TABLET BY MOUTH EVERY DAY BEFORE BREAKFAST 30 tablet 11   levothyroxine  (SYNTHROID ) 50 MCG tablet TAKE 1 TABLET (50 MCG TOTAL) BY MOUTH DAILY BEFORE BREAKFAST. TAKE 1 HOUR BEFORE BREAKFAST 90 tablet 3  metolazone  (ZAROXOLYN ) 5 MG tablet Take 5 mg by mouth once a week.     mexiletine (MEXITIL ) 150 MG capsule Take 1 capsule (150 mg total) by mouth every 12 (twelve) hours. 60 capsule 5   polyethylene glycol powder (GLYCOLAX /MIRALAX ) 17 GM/SCOOP powder Take 1 CAPFUL (17 g) by mouth daily as needed. 238 g 1   potassium chloride  SA (KLOR-CON  M) 20 MEQ tablet Take 2 tablets (40 mEq total) by mouth 2 (two) times daily. 120 tablet 1   sodium chloride  (OCEAN) 0.65 % nasal spray Place 2 sprays into the nose as needed for congestion.     spironolactone  (ALDACTONE ) 50 MG tablet Take 1 tablet (50 mg total) by mouth daily. 30 tablet 1   torsemide  (DEMADEX ) 20 MG tablet Take 4 tablets (80mg )  in the morning, and 3 tablets (60mg ) in the afternoon 180 tablet 2   UNABLE TO FIND Referral to [facility] -Please accept this patient as a referral for CKD 4 and diastolic CHF  Sigurd Pac MD [facility] 1 each 0   No current facility-administered medications for  this encounter.    Allergies:   Entresto  [sacubitril -valsartan ]   Social History:  The patient  reports that he has never smoked. He has never used smokeless tobacco. He reports current alcohol use. He reports that he does not use drugs.   Family History:  The patient's family history includes CVA in his mother; Gout in his father; Hypertension in his father; Multiple sclerosis in his mother; Seizures in his mother.   ROS:  Please see the history of present illness.   All other systems are personally reviewed and negative.    Recent Labs: 11/21/2023: TSH 21.175 11/24/2023: B Natriuretic Peptide 291.4 11/26/2023: ALT 32 11/28/2023: Hemoglobin 15.4; Platelets 316 11/30/2023: Magnesium  2.4 12/11/2023: BUN 35; Creatinine, Ser 1.85; Potassium 3.2; Sodium 135  Personally reviewed   Wt Readings from Last 3 Encounters:  12/19/23 114.2 kg (251 lb 12.8 oz)  11/30/23 107.1 kg (236 lb 1.6 oz)  10/24/23 107.9 kg (237 lb 14 oz)    BP 92/60   Pulse 71   Wt 114.2 kg (251 lb 12.8 oz)   SpO2 98%   BMI 38.29 kg/m   PHYSICAL EXAM: General:  sitting on exam table No resp difficulty HEENT: normal Neck: supple. JVP to jaw  Carotids 2+ bilat; no bruits. No lymphadenopathy or thryomegaly appreciated. Cor: PMI nondisplaced. Irregular rate & rhythm. No rubs, gallops or murmurs. Lungs: clear Abdomen: obese soft, nontender, nondistended. No hepatosplenomegaly. No bruits or masses. Good bowel sounds. Extremities: no cyanosis, clubbing, rash, 2+ edema Neuro: alert & orientedx3, cranial nerves grossly intact. moves all 4 extremities w/o difficulty. Affect pleasant   ICD interrogation (personally reviewed):HL score 18. Fluid was up and now it is back down to baseline No recent VT since 11/20/23 Activity level 2.3hr Personally reviewed     ASSESSMENT AND PLAN:  1. Acute on Chronic HFrEF - Echo (1/20):  EF 35-40% with mild RV dysfunction RVSP 40 mmHG - Echo (2/21): EF 40-45% Moderate RV dysfunction  - PYP  (4/22). Ratio 1.26 read as equivocal.  SPEP negative - cMRI repeated (11/22): LVEF 43%, RVEF 44%, LGE concerning for sarcoid.  - cMRI (8/21): EF 45% + infiltrative process suggestive of possible amyloidosis - Echo (4/23): EF 35-40% Moderate RV dysfunction - Echo 12/24 EF 35-40% - Has seen geneticist, Dr. Pac. Work up c/w LMNA.  - NYHA III Volume status up in setting of fluid intake - Currently  on torsemide  80/60 + metolazone  2.5 every Sunday + kcl 40. Will add additional dose of metolazone  2.5 _ Kcl 40 on wednesdays - Continue spironolactone  50 mg daily.  - Continue Jardiance  10 mg daily.  - Off b-blocker with bradycardia/heart block - No ARNi and ARB (hypotension and N/V in the past). - No Bidil  (hypotension in the past) - Labs today and in 2 weeks - I worry his HF is progressing but not candidate for advanced therapies with CKD and size   2. VT - 04/23 VT - s/p ICD 4/23. - VT strm 1/25 in setting of hypokalemia - Continue mexiletine 150 mg bid   - Continue amiodarone  200 bid - Continue Eliquis  5 mg bid.  - No VT on device interrogation today - Followed by Dr. Waddell - Labs today  3. CKD Stage IIIb - Baseline SCr 2.0-2.3 - Labs today   4. Chronic AF/AFL - S/p AFL ablation 5/19 with Dr. Waddell. - Continue Eliquis  5 mg bid. No bleeding issues. - Continue amiodarone  200 mg bid. Rate controlled. Consider decreasing at next visit - Amio labs [facility] 9/23.Will repeat   5.  OSA - Not wearing currently - Encouraged him to get device fixed  6. PVCs/VT - Continue mexiletine + amiodarone . - No VT on device today - Followed by EP   7. Obesity - Body mass index is 38.29 kg/m. - Eventual GLP1RE  8. Hypothyroidism - likely amio-induced - labs today  I spent a total of 45 minutes today: 1) reviewing the patient's medical records including previous charts, labs and recent notes from other providers; 2) examining the patient and counseling them on their medical issues/explaining  the plan of care; 3) adjusting meds as needed and 4) ordering lab work or other needed tests.     Toribio Fuel, MD  2:24 PM

## 2023-12-20 ENCOUNTER — Other Ambulatory Visit (HOSPITAL_COMMUNITY): Payer: Self-pay

## 2023-12-20 LAB — T3, FREE: T3, Free: 2.8 pg/mL (ref 2.0–4.4)

## 2023-12-20 NOTE — Telephone Encounter (Signed)
 As of 12/20/2023, coverage still has not been reactivated. Will continue to follow up.

## 2023-12-21 ENCOUNTER — Ambulatory Visit: Payer: Medicaid Other | Admitting: Pulmonary Disease

## 2023-12-25 ENCOUNTER — Other Ambulatory Visit (HOSPITAL_COMMUNITY): Payer: Self-pay

## 2023-12-25 NOTE — Telephone Encounter (Signed)
 As of 12/25/2023, issues with [employer] coverage have not been resolved. Test claims still show coverage terminated. Will continue to follow for updates.

## 2023-12-27 ENCOUNTER — Encounter: Payer: Self-pay | Admitting: Internal Medicine

## 2023-12-28 ENCOUNTER — Other Ambulatory Visit (HOSPITAL_COMMUNITY): Payer: Self-pay

## 2023-12-28 MED ORDER — EMPAGLIFLOZIN 10 MG PO TABS: 10.0000 mg | ORAL_TABLET | Freq: Every day | ORAL | 11 refills | Status: DC

## 2023-12-28 NOTE — Addendum Note (Signed)
Addended by: Theresia Bough on: 12/28/2023 09:26 AM   Modules accepted: Orders

## 2023-12-28 NOTE — Telephone Encounter (Signed)
As of 12/28/2023, [employer] coverage is listed as active. Test billing returns $4 copay, no prior authorization required.

## 2023-12-28 NOTE — Telephone Encounter (Signed)
As of 02/13, [employer] coverage is listed as active. Test billing returns $4 copay. No prior authorization required, this issue has been resolved.

## 2024-01-02 ENCOUNTER — Ambulatory Visit (HOSPITAL_COMMUNITY)
Admission: RE | Admit: 2024-01-02 | Discharge: 2024-01-02 | Disposition: A | Discharge status: Home / Self Care | Payer: 59 | Source: Ambulatory Visit | Attending: Cardiology | Admitting: Cardiology

## 2024-01-02 DIAGNOSIS — I5022 Chronic systolic (congestive) heart failure: Secondary | ICD-10-CM | POA: Diagnosis not present

## 2024-01-02 LAB — BASIC METABOLIC PANEL
Anion gap: 13 (ref 5–15)
BUN: 42 mg/dL — ABNORMAL HIGH (ref 6–20)
CO2: 31 mmol/L (ref 22–32)
Calcium: 9 mg/dL (ref 8.9–10.3)
Chloride: 90 mmol/L — ABNORMAL LOW (ref 98–111)
Creatinine, Ser: 2.46 mg/dL — ABNORMAL HIGH (ref 0.61–1.24)
GFR, Estimated: 33 mL/min — ABNORMAL LOW (ref >60)
Glucose, Bld: 90 mg/dL (ref 70–99)
Potassium: 3.6 mmol/L (ref 3.5–5.1)
Sodium: 134 mmol/L — ABNORMAL LOW (ref 135–145)

## 2024-01-02 LAB — BRAIN NATRIURETIC PEPTIDE: B Natriuretic Peptide: 294.8 pg/mL — ABNORMAL HIGH (ref 0.0–100.0)

## 2024-01-03 ENCOUNTER — Other Ambulatory Visit: Payer: Self-pay

## 2024-01-03 ENCOUNTER — Other Ambulatory Visit (HOSPITAL_COMMUNITY): Payer: Self-pay

## 2024-01-04 NOTE — Telephone Encounter (Signed)
He is with ([facility] currently and I will move him to [facility].  New DME Upon patient request DME selection is [facility] Patient understands he will be contacted by [facility] to set up his cpap. Patient understands to call if [facility] does not contact him with new setup in a timely manner. Patient understands they will be called once confirmation has been received from [facility] that they have received their new machine to schedule 10 week follow up appointment.   [facility] notified of new cpap order  Please add to airview Patient was grateful for the call and thanked me.

## 2024-01-04 NOTE — Progress Notes (Deleted)
 Cardiology Office Note:  .   Date:  01/04/2024  ID:  ALTONIO SCHWERTNER, DOB [birth year], MRN [id number] PCP: [name], MD  [facility] Providers Cardiologist:  None Electrophysiologist:  Lewayne Bunting, MD  Advanced Heart Failure:  Arvilla Meres, MD  Sleep Medicine:  Armanda Magic, MD {  History of Present Illness: .   Bradley Powell is a 43 y.o. male w/PMHx of obesity, HTN, AFlutter (ablated),  AFib, OSA, PVCs, CKD (IIIa), gout, VT, hypothyroid HFrEF 2/2 infiltrative cardiomyopathy (LMNA cardiomyopathy)    Admitted April 2023 after syncope--> WCT and A/C HFrEF.  Recurrent VT started on amiodarone and mexiletine 4/10 transitioned to PO amio ICD implanted  Following with AHF/EP teams  A number of hospitalizations with acute/chronic CHF exacerbations Hypokalemia   Admitted Jan 2025 with VT/ICD therapies, had missed some meds over the weekend, markedly hypokalemic > replaced, diuresed as well EP, continue amio 400 mg bid until 1/17, then reduce down to 200 mg bid until EP clinic f/u  Mexiletine continued 150mg  BID Discharged 11/30/23  Saw Dr. [name] 12/19/23, concerns his HF is progressing but not candidate for advanced therapies with CKD and size  Amio BID with some consideration to reduce at his next visit Planned for labs Hypothyroid suspect 2/2 amio  Today's visit is scheduled as a post hospital visit  ROS:   *** amio labs *** dose? Reduce? *** VT? *** HRs/perm AF *** volume   Device information BSCi single chamber ICD implanted 02/22/22  Arrhythmia/AAD hx CTI ablation 04/06/2018 AFib described as permanent VT >> amiodarone started April 2023, mexiletine started April 2023 as well  Studies Reviewed: Marland Kitchen    EKG not done today  DEVICE interrogation done today and reviewed by myself *** Battery and lead measurements are good ***   10/16/23: TTE 1. Limited study for LV function   2. Left ventricular ejection fraction, by estimation, is 35 to  40%. Left  ventricular ejection fraction by 2D MOD biplane is 37.9 %. The left  ventricle has moderately decreased function. The left ventricle  demonstrates global hypokinesis. There is mild  left ventricular hypertrophy.   3. The mitral valve is abnormal. Mild mitral valve regurgitation.   4. Tricuspid valve regurgitation is moderate.   5. Right ventricular systolic function is mildly reduced. The right  ventricular size is normal. There is normal pulmonary artery systolic  pressure. The estimated right ventricular systolic pressure is 35.2 mmHg.   6. The inferior vena cava is dilated in size with <50% respiratory  variability, suggesting right atrial pressure of 15 mmHg.   7. Rhythm strip during this exam demonstrates atrial flutter.   Comparison(s): No significant change from prior study. 10/03/2023: LVEF  35-40%.    Echo 1/20  EF 35-40% with mild RV dysfunction RVSP 40 mmHG - Echo (12/16/19): EF 40-45% Moderate RV dysfunction  - Echo 4/23 EF 35-40% Moderate RV dysfunction - cMRI 8/21 EF 45% + infiltrative process suggestive of possible amyloidosis - cMRI repeated 09/2021 EF 43% LGE concerning for sarcoid.  - PYP 4/22. Ratio 1.26 read as equivocal.  SPEP negative   Risk Assessment/Calculations:    Physical Exam:   VS:  There were no vitals taken for this visit.   Wt Readings from Last 3 Encounters:  12/19/23 251 lb 12.8 oz (114.2 kg)  11/30/23 236 lb 1.6 oz (107.1 kg)  10/24/23 237 lb 14 oz (107.9 kg)    GEN: Well nourished, well developed in no acute distress NECK: No JVD; No  carotid bruits CARDIAC: ***RRR, no murmurs, rubs, gallops RESPIRATORY:  *** CTA b/l without rales, wheezing or rhonchi  ABDOMEN: Soft, non-tender, non-distended EXTREMITIES: ***  No edema; No deformity   ICD site: *** is stable, no thinning, fluctuation, tethering  ASSESSMENT AND PLAN: .    ICD *** intact function *** no programming changes made  VT Chronic amiodarone/mexiletine Some  dergree of medication noncompliance plays a role Tendency towards hypokalemia ***  Permanent AFib CHA2DS2Vasc is 2, on Eliquis, *** appropriately dosed *** controlled rate  NICM MRIs have been abnormal, possible sarcoid, possible amyloid >> Dr. Jomarie Longs. Work up c/w LMNA  *** C/w Dr. Lynne Logan  Secondary hypercoagulable state 2/2 AFib   {Are you ordering a CV Procedure (e.g. stress test, cath, DCCV, TEE, etc)?   Press F2        :[id number]}     Dispo: ***  Signed, Sheilah Pigeon, PA-C

## 2024-01-05 NOTE — Progress Notes (Signed)
 Remote ICD transmission.

## 2024-01-05 NOTE — Addendum Note (Signed)
Addended by: Elease Etienne A on: 01/05/2024 08:10 AM   Modules accepted: Orders

## 2024-01-08 ENCOUNTER — Ambulatory Visit: Payer: 59 | Attending: Physician Assistant | Admitting: Physician Assistant

## 2024-01-09 ENCOUNTER — Encounter: Payer: Self-pay | Admitting: Physician Assistant

## 2024-01-16 ENCOUNTER — Ambulatory Visit (HOSPITAL_COMMUNITY)
Admission: RE | Admit: 2024-01-16 | Discharge: 2024-01-16 | Disposition: A | Discharge status: Home / Self Care | Payer: Medicaid Other | Source: Ambulatory Visit | Attending: Cardiology | Admitting: Cardiology

## 2024-01-16 ENCOUNTER — Encounter (HOSPITAL_COMMUNITY): Payer: Self-pay

## 2024-01-16 VITALS — BP 110/64 | HR 76 | Ht 68.0 in | Wt 248.6 lb

## 2024-01-16 DIAGNOSIS — I482 Chronic atrial fibrillation, unspecified: Secondary | ICD-10-CM | POA: Insufficient documentation

## 2024-01-16 DIAGNOSIS — I130 Hypertensive heart and chronic kidney disease with heart failure and stage 1 through stage 4 chronic kidney disease, or unspecified chronic kidney disease: Secondary | ICD-10-CM | POA: Insufficient documentation

## 2024-01-16 DIAGNOSIS — Z7901 Long term (current) use of anticoagulants: Secondary | ICD-10-CM | POA: Insufficient documentation

## 2024-01-16 DIAGNOSIS — N1832 Chronic kidney disease, stage 3b: Secondary | ICD-10-CM | POA: Insufficient documentation

## 2024-01-16 DIAGNOSIS — G4733 Obstructive sleep apnea (adult) (pediatric): Secondary | ICD-10-CM | POA: Insufficient documentation

## 2024-01-16 DIAGNOSIS — Z95810 Presence of automatic (implantable) cardiac defibrillator: Secondary | ICD-10-CM | POA: Insufficient documentation

## 2024-01-16 DIAGNOSIS — I5022 Chronic systolic (congestive) heart failure: Secondary | ICD-10-CM

## 2024-01-16 DIAGNOSIS — Z79890 Hormone replacement therapy (postmenopausal): Secondary | ICD-10-CM | POA: Insufficient documentation

## 2024-01-16 DIAGNOSIS — Z79899 Other long term (current) drug therapy: Secondary | ICD-10-CM | POA: Insufficient documentation

## 2024-01-16 DIAGNOSIS — M109 Gout, unspecified: Secondary | ICD-10-CM | POA: Insufficient documentation

## 2024-01-16 DIAGNOSIS — E039 Hypothyroidism, unspecified: Secondary | ICD-10-CM | POA: Insufficient documentation

## 2024-01-16 DIAGNOSIS — Z6837 Body mass index (BMI) 37.0-37.9, adult: Secondary | ICD-10-CM | POA: Insufficient documentation

## 2024-01-16 DIAGNOSIS — I472 Ventricular tachycardia, unspecified: Secondary | ICD-10-CM | POA: Insufficient documentation

## 2024-01-16 DIAGNOSIS — Z7984 Long term (current) use of oral hypoglycemic drugs: Secondary | ICD-10-CM | POA: Insufficient documentation

## 2024-01-16 DIAGNOSIS — I5042 Chronic combined systolic (congestive) and diastolic (congestive) heart failure: Secondary | ICD-10-CM | POA: Diagnosis not present

## 2024-01-16 LAB — T4, FREE: Free T4: 0.65 ng/dL (ref 0.61–1.12)

## 2024-01-16 LAB — COMPREHENSIVE METABOLIC PANEL
ALT: 57 U/L — ABNORMAL HIGH (ref 0–44)
AST: 61 U/L — ABNORMAL HIGH (ref 15–41)
Albumin: 2.7 g/dL — ABNORMAL LOW (ref 3.5–5.0)
Alkaline Phosphatase: 123 U/L (ref 38–126)
Anion gap: 18 — ABNORMAL HIGH (ref 5–15)
BUN: 35 mg/dL — ABNORMAL HIGH (ref 6–20)
CO2: 24 mmol/L (ref 22–32)
Calcium: 8.8 mg/dL — ABNORMAL LOW (ref 8.9–10.3)
Chloride: 92 mmol/L — ABNORMAL LOW (ref 98–111)
Creatinine, Ser: 2.14 mg/dL — ABNORMAL HIGH (ref 0.61–1.24)
GFR, Estimated: 39 mL/min — ABNORMAL LOW (ref >60)
Glucose, Bld: 93 mg/dL (ref 70–99)
Potassium: 2.9 mmol/L — ABNORMAL LOW (ref 3.5–5.1)
Sodium: 134 mmol/L — ABNORMAL LOW (ref 135–145)
Total Bilirubin: 2.7 mg/dL — ABNORMAL HIGH (ref 0.0–1.2)
Total Protein: 6.3 g/dL — ABNORMAL LOW (ref 6.5–8.1)

## 2024-01-16 LAB — LIPID PANEL
Cholesterol: 157 mg/dL (ref 0–200)
HDL: 53 mg/dL (ref >40)
LDL Cholesterol: 91 mg/dL (ref 0–99)
Total CHOL/HDL Ratio: 3 ratio
Triglycerides: 67 mg/dL (ref <150)
VLDL: 13 mg/dL (ref 0–40)

## 2024-01-16 LAB — CBC
HCT: 45.4 % (ref 39.0–52.0)
Hemoglobin: 14.5 g/dL (ref 13.0–17.0)
MCH: 24.2 pg — ABNORMAL LOW (ref 26.0–34.0)
MCHC: 31.9 g/dL (ref 30.0–36.0)
MCV: 75.7 fL — ABNORMAL LOW (ref 80.0–100.0)
Platelets: 255 10*3/uL (ref 150–400)
RBC: 6 MIL/uL — ABNORMAL HIGH (ref 4.22–5.81)
RDW: 18.9 % — ABNORMAL HIGH (ref 11.5–15.5)
WBC: 7.4 10*3/uL (ref 4.0–10.5)
nRBC: 0 % (ref 0.0–0.2)

## 2024-01-16 LAB — TSH: TSH: 24.819 u[IU]/mL — ABNORMAL HIGH (ref 0.350–4.500)

## 2024-01-16 NOTE — Progress Notes (Signed)
 Advanced [facility] Failure Clinic Note   PCP:  [name], MD  GI : Dr Marko Plume.  HF Cardiologist: Dr. [name]  Chief Complaint: follow up for systolic heart failure    HPI: Bradley Powell is a 43 y.o. male with a history of combined systolic/diastolic heart failure, obesity, HTN, and gout.   Admitted 4/18 with marked volume overload. EF 40-45%. Diuresed with IV lasix tid + metolazone. Had RHC/LHC as noted below. Overall diuresed 45 pounds. Discharge weight was 281 pounds.    AFL ablation on 03/2018. Subsequently had marked 1st AV block with progression to junctional rhythm. Saw Dr. Ladona Ridgel who wanted to follow it. Suggested cutting back carvedilol.  Previously Bidil stopped due to hypotension.  Admitted  8/21 with intractable N/V of unclear etiology, hypotension and AKI. Creatinine peaked at 4.5. Back to 1.6 at d/c. Has been followed by GI.   cMRI 7/21 LVEF 45% suspect infiltrative CM. ECV 55%  Volume overloaded at clinic f/u 6/22, weight up 50 lbs. Misunderstood metolazone directions and took for 5 days. Weight down 65 lbs and required IVF in clinic.  Admitted 4/23 with VT. Had massive volume overload on exam. Had recurrent sustained VT w/ rates 200-210, converted with IV amio and moved to ICU. Echo EF 35-40% RV modHK. Diuresed over 50 pounds with IV lasix. Underwent [employer] ICD implant. IV amio switched to po (already on mexilitene for PVCs). 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   In 9/23, weight up 20 lbs. ReDs 44%. Prescribed Furoscix 80 mg daily + 40 extra KCL daily x 3 days. Instructed to restart torsemide 40 mg thereafter. Remained volume up at follow up 10/23. Torsemide increased to 40 bid and added metolazone 2.5 daily x 3 days.  Admitted for 3 weeks 11/24-12/24 diuresed 70 pounds on high-dose lasix  Readmitted 1/25 with VT storm in setting of hypokalemia  and recurrent volume overload. Loaded amio. D/c weight 236 pounds.   Seen in clinic earlier last  month and was volume up. HL score was elevated at 18L. Wt was up to 151 lb on our scale. He was instructed to increase metolazone to 2 days a wk on Sunday's and Mondays.  He presents back today for f/u. Wt down 3 lb at 148 lb but he says wt at home is lower, ~140-143 lb. He reports robust UOP on metolazone days. HL score has improved and back to normal at 2. No further VT/ NSVT episodes on device interrogation.  Reports NYHA Class II symptoms. BP well controlled 110/64.      Cardiac Studies - Echo (4/23): EF 35-40%, moderate LV dysfunction, RV moderately reduced, mild to moderate MR, moderate TR, posterior pericardial effusion  - cMRI (7/21): 1. Findings suggestive of infiltrative cardiomyopathy. Native T1 ECV 55%, with diffuse post contrast delayed myocardial enhancement and abnormal T1 myocardial nulling kinetics, consistent with a diagnosis of cardiac amyloidosis. 2.  Upper limit of normal biventricular chamber size. 3. Mildly reduced biventricular systolic function. LVEF 45%, RVEF 44%. 4. Cannot exclude possible anomalous pulmonary venous drainage in to the mid SVC. Seen on axial HASTE image series 3 image 4, but evaluation limited due to poor spatial resolution. Consider ECG gated CTA chest for further evaluation.  - Echo (2/21): EF 40-45%  - Echo (1/20): EF 35-40% - Echo (3/19): EF 45-50%. - Echo (9/18): EF ~25-30%.  - Holter (7/18): 6% PVCs - Sleep study (10/18): AHI 26/hr  - RHC (2/21): RA = 14 RV = 43/15 PA =  43/21 (31) PCW = 15 Fick cardiac output/index = 5.7/2.4 PVR = 2.8 WU Ao sat = 97% PA sat = 73%, 75% SVC sat = 67%   - R/LHC (4/18): AO = 109/86 (97) LV =  101/28 RA =  29 RV = 50/22 PA = 55/24 (38) PCW = 26 Fick cardiac output/index = 6.4/2.6 Thermal CO/CI = 4.9/2.0 PVR = 2.5 WU FA sat = 98% PA sat = 69%, 71% SVC sat = 82%, 82% IVC sat = 66%   1. Normal coronary arteries  2. Mild to moderate pulmonary HTN with normal PVR 3. Biventricular volume  overload with R > L heart failure 4. Moderately decreased CO by thermodilution  5. 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 No intra-cardiac shunt  Past Medical History:  Diagnosis Date   Atrial flutter (HCC)    a. s/p ablation 03/2018.   Chronic combined systolic and diastolic CHF (congestive heart failure) (HCC)    History of esophagogastroduodenoscopy (EGD)    Morbid obesity (HCC)    NICM (nonischemic cardiomyopathy) (HCC)    OSA on CPAP    mild OSA with an AHI of 7.7/hr on auto CPAP   PVC's (premature ventricular contractions)    Past Surgical History:  Procedure Laterality Date   A-FLUTTER ABLATION N/A 04/06/2018   Procedure: A-FLUTTER ABLATION;  Surgeon: Marinus Maw, MD;  Location: [facility] INVASIVE CV LAB;  Service: Cardiovascular;  Laterality: N/A;   BIOPSY  07/11/2023   Procedure: BIOPSY;  Surgeon: Kathi Der, MD;  Location: [facility] ENDOSCOPY;  Service: Gastroenterology;;   ESOPHAGEAL BRUSHING  06/19/2020   Procedure: ESOPHAGEAL BRUSHING;  Surgeon: Charlott Rakes, MD;  Location: [facility] ENDOSCOPY;  Service: Endoscopy;;   ESOPHAGOGASTRODUODENOSCOPY (EGD) WITH PROPOFOL Left 06/19/2020   Procedure: ESOPHAGOGASTRODUODENOSCOPY (EGD) WITH PROPOFOL;  Surgeon: Charlott Rakes, MD;  Location: [facility] ENDOSCOPY;  Service: Endoscopy;  Laterality: Left;   ESOPHAGOGASTRODUODENOSCOPY (EGD) WITH PROPOFOL N/A 07/11/2023   Procedure: ESOPHAGOGASTRODUODENOSCOPY (EGD) WITH PROPOFOL;  Surgeon: Kathi Der, MD;  Location: [facility] ENDOSCOPY;  Service: Gastroenterology;  Laterality: N/A;   ICD IMPLANT N/A 02/22/2022   Procedure: ICD IMPLANT;  Surgeon: Marinus Maw, MD;  Location: [facility] INVASIVE CV LAB;  Service: Cardiovascular;  Laterality: N/A;   RIGHT HEART CATH N/A 12/20/2019   Procedure: RIGHT HEART CATH;  Surgeon: Dolores Patty, MD;  Location: [facility] INVASIVE CV LAB;  Service: Cardiovascular;  Laterality: N/A;    RIGHT/LEFT HEART CATH AND CORONARY ANGIOGRAPHY N/A 03/01/2017   Procedure: Right/Left Heart Cath and Coronary Angiography;  Surgeon: Dolores Patty, MD;  Location[facility] INVASIVE CV LAB;  Service: Cardiovascular;  Laterality: N/A;   Current Outpatient Medications  Medication Sig Dispense Refill   acetaminophen (TYLENOL) 500 MG tablet Take 1,000 mg by mouth as needed for mild pain (pain score 1-3) (Leg pain).     allopurinol (ZYLOPRIM) 100 MG tablet Take 1 tablet (100 mg total) by mouth daily. Please contact PCP for further refills     amiodarone (PACERONE) 200 MG tablet Take 200 mg by mouth 2 (two) times daily.     atorvastatin (LIPITOR) 40 MG tablet Take 1 tablet (40 mg total) by mouth daily. 30 tablet 1   diclofenac Sodium (VOLTAREN) 1 % GEL Apply 2 g topically daily as needed (pain).     ELIQUIS 5 MG TABS tablet TAKE 1 TABLET BY MOUTH TWICE A DAY 60 tablet 11   empagliflozin (JARDIANCE) 10 MG TABS tablet Take  1 tablet (10 mg total) by mouth daily. 30 tablet 11   esomeprazole (NEXIUM) 40 MG capsule Take 40 mg by mouth 2 (two) times daily.     levothyroxine (SYNTHROID) 50 MCG tablet TAKE 1 TABLET (50 MCG TOTAL) BY MOUTH DAILY BEFORE BREAKFAST. TAKE 1 HOUR BEFORE BREAKFAST 90 tablet 3   metolazone (ZAROXOLYN) 5 MG tablet Take 1 tablet (5 mg total) by mouth 2 (two) times a week. Every Sunday and Wednesday 10 tablet 3   mexiletine (MEXITIL) 150 MG capsule Take 1 capsule (150 mg total) by mouth every 12 (twelve) hours. 60 capsule 5   polyethylene glycol powder (GLYCOLAX/MIRALAX) 17 GM/SCOOP powder Take 1 CAPFUL (17 g) by mouth daily as needed. 238 g 1   potassium chloride SA (KLOR-CON M) 20 MEQ tablet Take 2 tablets (40 mEq total) by mouth 2 (two) times daily. Take extra 2 tabs when you take metolazone 140 tablet 1   sodium chloride (OCEAN) 0.65 % nasal spray Place 2 sprays into the nose as needed for congestion.     spironolactone (ALDACTONE) 50 MG tablet Take 1 tablet (50 mg total) by mouth daily.  30 tablet 1   torsemide (DEMADEX) 20 MG tablet Take 4 tablets (80mg )  in the morning, and 3 tablets (60mg ) in the afternoon 180 tablet 2   UNABLE TO FIND Referral to [facility] -Please accept this patient as a referral for CKD 4 and diastolic CHF  Zannie Cove MD [facility] 1 each 0   No current facility-administered medications for this encounter.    Allergies:   Entresto [sacubitril-valsartan]   Social History:  The patient  reports that he has never smoked. He has never used smokeless tobacco. He reports current alcohol use. He reports that he does not use drugs.   Family History:  The patient's family history includes CVA in his mother; Gout in his father; Hypertension in his father; Multiple sclerosis in his mother; Seizures in his mother.   ROS:  Please see the history of present illness.   All other systems are personally reviewed and negative.    Recent Labs: 11/28/2023: Hemoglobin 15.4; Platelets 316 11/30/2023: Magnesium 2.4 12/19/2023: ALT 32; TSH 21.359 01/02/2024: B Natriuretic Peptide 294.8; BUN 42; Creatinine, Ser 2.46; Potassium 3.6; Sodium 134  Personally reviewed   Wt Readings from Last 3 Encounters:  01/16/24 112.8 kg (248 lb 9.6 oz)  12/19/23 114.2 kg (251 lb 12.8 oz)  11/30/23 107.1 kg (236 lb 1.6 oz)    BP 110/64   Pulse 76   Ht 5\' 8"  (1.727 m)   Wt 112.8 kg (248 lb 9.6 oz)   SpO2 97%   BMI 37.80 kg/m   PHYSICAL EXAM: General:  Well appearing, mod obese. No respiratory difficulty HEENT: normal Neck: supple. JVD 8-9 cm. Carotids 2+ bilat; no bruits. No lymphadenopathy or thyromegaly appreciated. Cor: PMI nondisplaced. Regular rate & rhythm. No rubs, gallops or murmurs. Lungs: clear Abdomen: obese, soft, nontender, nondistended.\ Extremities: no cyanosis, clubbing, rash, trace b/l pretibial edema + TED hoses  Neuro: alert & oriented x 3, cranial nerves grossly intact. moves all 4 extremities w/o difficulty. Affect  pleasant.   ICD interrogation (personally reviewed):HL score 2. No recent VT since 11/20/23.  Activity level 2.4 hr/day. Personally reviewed    ASSESSMENT AND PLAN:  1. Chronic HFrEF - Echo (1/20):  EF 35-40% with mild RV dysfunction RVSP 40 mmHG - Echo (2/21): EF 40-45% Moderate RV dysfunction  - PYP (4/22). Ratio 1.26 read as  equivocal.  SPEP negative - cMRI repeated (11/22): LVEF 43%, RVEF 44%, LGE concerning for sarcoid.  - cMRI (8/21): EF 45% + infiltrative process suggestive of possible amyloidosis - Echo (4/23): EF 35-40% Moderate RV dysfunction - Echo 12/24 EF 35-40% - Has seen geneticist, Dr. Jomarie Longs. Work up c/w LMNA.  - NYHA II Volume improved w/ diuretic increase, Wt down, HL Score improved from 18>>2 today  - Continue torsemide 80/60 + metolazone 2.5 every Sunday and Wednesdays w/ an extra 40 mEq of KCL on those days - Continue spironolactone 50 mg daily.  - Continue Jardiance 10 mg daily.  - Off b-blocker with bradycardia/heart block - No ARNi and ARB (hypotension and N/V in the past). - No Bidil (hypotension in the past) - Check BMP today  - not candidate for Advanced therapies given CKD and size   2. VT - 04/23 VT - s/p ICD 4/23. - VT storm 1/25 in setting of hypokalemia - no further episodes on device interrogation today  - Continue mexiletine 150 mg bid   - Continue amiodarone 200 bid. Check TSH and HFTs - Continue Eliquis 5 mg bid. Denies bleeding, check CBC today  - Followed by Dr. Ladona Ridgel  3. CKD Stage IIIb - Baseline SCr 2.0-2.3 - Check BMP today    4. Chronic AF/AFL - S/p AFL ablation 5/19 with Dr. Ladona Ridgel. - Continue Amio per above  - Continue Eliquis 5 mg bid.    5.  OSA - currently untreated, needs new device. He reports he is scheduled for a repeat sleep study   7. Obesity - Body mass index is 37.8 kg/m. - Eventual GLP1Ri  8. Hypothyroidism - likely amio-induced - continue levothyroxine - repeat TFTs today   F/u in 4-6 wks w/ Dr.  [name] at [facility]. Lives in [facility] [place], [place]  3:19 PM

## 2024-01-16 NOTE — Patient Instructions (Addendum)
 Thank you for coming in today  If you had labs drawn today, any labs that are abnormal the clinic will call you No news is good news  Medications: No changes  Follow up appointments:  Your physician recommends that you schedule a follow-up appointment in:  4-6 weeks With Dr. [name]    Do the following things EVERYDAY: Weigh yourself in the morning before breakfast. Write it down and keep it in a log. Take your medicines as prescribed Eat low salt foods--Limit salt (sodium) to 2000 mg per day.  Stay as active as you can everyday Limit all fluids for the day to less than 2 liters   At the [facility], you and your health needs are our priority. As part of our continuing mission to provide you with exceptional heart care, we have created designated Provider Care Teams. These Care Teams include your primary Cardiologist (physician) and Advanced Practice Providers (APPs- Physician Assistants and Nurse Practitioners) who all work together to provide you with the care you need, when you need it.   You may see any of the following providers on your designated Care Team at your next follow up: Dr Arvilla Meres Dr Marca Ancona Dr. Marcos Eke, NP Robbie Lis, [place] [facility] [place], [place] Brynda Peon, NP Karle Plumber, PharmD   Please be sure to bring in all your medications bottles to every appointment.    Thank you for choosing [facility]  If you have any questions or concerns before your next appointment please send [place] a message through [place] or call our office at [phone].    TO LEAVE A MESSAGE FOR THE NURSE SELECT OPTION 2, PLEASE LEAVE A MESSAGE INCLUDING: YOUR NAME DATE OF BIRTH CALL BACK NUMBER REASON FOR CALL**this is important as we prioritize the call backs  YOU WILL RECEIVE A CALL BACK THE SAME DAY AS LONG AS YOU CALL BEFORE 4:00 PM

## 2024-01-17 ENCOUNTER — Telehealth (HOSPITAL_COMMUNITY): Payer: Self-pay

## 2024-01-17 DIAGNOSIS — I5022 Chronic systolic (congestive) heart failure: Secondary | ICD-10-CM

## 2024-01-17 LAB — T3, FREE: T3, Free: 3.1 pg/mL (ref 2.0–4.4)

## 2024-01-17 MED ORDER — POTASSIUM CHLORIDE CRYS ER 20 MEQ PO TBCR: 40.0000 meq | EXTENDED_RELEASE_TABLET | Freq: Three times a day (TID) | ORAL | 3 refills | Status: DC

## 2024-01-17 NOTE — Telephone Encounter (Signed)
 Spoke with patient regarding the following results. Patient made aware and patient verbalized understanding.   New prescription for potassium sent to patient preferred pharmacy.   Repeat labs ordered and scheduled.

## 2024-01-17 NOTE — Telephone Encounter (Signed)
-----   Message from [facility] sent at 01/17/2024  7:24 AM EST ----- Please call potassium is low 2.9. Increase potassium to 40 meq three times a day. Repeat BMET next week.  Renal function stable.  TSH high. He needs follow up with PCP to address.

## 2024-01-26 ENCOUNTER — Ambulatory Visit (HOSPITAL_COMMUNITY)
Admission: RE | Admit: 2024-01-26 | Discharge: 2024-01-26 | Disposition: A | Discharge status: Home / Self Care | Source: Ambulatory Visit | Attending: Cardiology | Admitting: Cardiology

## 2024-01-26 ENCOUNTER — Encounter (HOSPITAL_COMMUNITY): Payer: Self-pay | Admitting: Cardiology

## 2024-01-26 DIAGNOSIS — I5042 Chronic combined systolic (congestive) and diastolic (congestive) heart failure: Secondary | ICD-10-CM | POA: Diagnosis not present

## 2024-01-26 LAB — BASIC METABOLIC PANEL
Anion gap: 13 (ref 5–15)
BUN: 47 mg/dL — ABNORMAL HIGH (ref 6–20)
CO2: 30 mmol/L (ref 22–32)
Calcium: 8.8 mg/dL — ABNORMAL LOW (ref 8.9–10.3)
Chloride: 91 mmol/L — ABNORMAL LOW (ref 98–111)
Creatinine, Ser: 2.56 mg/dL — ABNORMAL HIGH (ref 0.61–1.24)
GFR, Estimated: 32 mL/min — ABNORMAL LOW (ref >60)
Glucose, Bld: 122 mg/dL — ABNORMAL HIGH (ref 70–99)
Potassium: 3.5 mmol/L (ref 3.5–5.1)
Sodium: 134 mmol/L — ABNORMAL LOW (ref 135–145)

## 2024-02-03 ENCOUNTER — Other Ambulatory Visit (HOSPITAL_COMMUNITY): Payer: Self-pay

## 2024-02-05 ENCOUNTER — Telehealth: Payer: Self-pay | Admitting: Internal Medicine

## 2024-02-05 NOTE — Telephone Encounter (Signed)
 Called to schedule recall

## 2024-02-07 ENCOUNTER — Encounter: Payer: Self-pay | Admitting: Internal Medicine

## 2024-02-08 ENCOUNTER — Telehealth: Payer: Self-pay | Admitting: Internal Medicine

## 2024-02-08 NOTE — Telephone Encounter (Signed)
 Lvm to sched recall

## 2024-02-13 ENCOUNTER — Other Ambulatory Visit (HOSPITAL_COMMUNITY): Payer: Self-pay

## 2024-02-13 ENCOUNTER — Encounter (HOSPITAL_COMMUNITY): Payer: Self-pay

## 2024-02-17 ENCOUNTER — Observation Stay (HOSPITAL_COMMUNITY)

## 2024-02-17 ENCOUNTER — Other Ambulatory Visit: Payer: Self-pay

## 2024-02-17 ENCOUNTER — Encounter (HOSPITAL_COMMUNITY): Payer: Self-pay | Admitting: Emergency Medicine

## 2024-02-17 ENCOUNTER — Emergency Department (HOSPITAL_COMMUNITY)

## 2024-02-17 ENCOUNTER — Inpatient Hospital Stay (HOSPITAL_COMMUNITY)
Admission: EM | Admit: 2024-02-17 | Discharge: 2024-02-22 | DRG: 640 | Disposition: A | Discharge status: Home / Self Care | Attending: Internal Medicine | Admitting: Internal Medicine

## 2024-02-17 DIAGNOSIS — R55 Syncope and collapse: Secondary | ICD-10-CM | POA: Diagnosis not present

## 2024-02-17 DIAGNOSIS — Z8249 Family history of ischemic heart disease and other diseases of the circulatory system: Secondary | ICD-10-CM

## 2024-02-17 DIAGNOSIS — M50321 Other cervical disc degeneration at C4-C5 level: Secondary | ICD-10-CM | POA: Diagnosis not present

## 2024-02-17 DIAGNOSIS — Z7901 Long term (current) use of anticoagulants: Secondary | ICD-10-CM

## 2024-02-17 DIAGNOSIS — E876 Hypokalemia: Principal | ICD-10-CM | POA: Diagnosis present

## 2024-02-17 DIAGNOSIS — Z95811 Presence of heart assist device: Secondary | ICD-10-CM

## 2024-02-17 DIAGNOSIS — I517 Cardiomegaly: Secondary | ICD-10-CM | POA: Diagnosis not present

## 2024-02-17 DIAGNOSIS — Z7984 Long term (current) use of oral hypoglycemic drugs: Secondary | ICD-10-CM

## 2024-02-17 DIAGNOSIS — M542 Cervicalgia: Secondary | ICD-10-CM | POA: Diagnosis not present

## 2024-02-17 DIAGNOSIS — S0990XA Unspecified injury of head, initial encounter: Secondary | ICD-10-CM | POA: Diagnosis not present

## 2024-02-17 DIAGNOSIS — I4891 Unspecified atrial fibrillation: Secondary | ICD-10-CM | POA: Diagnosis present

## 2024-02-17 DIAGNOSIS — Z79890 Hormone replacement therapy (postmenopausal): Secondary | ICD-10-CM

## 2024-02-17 DIAGNOSIS — G4733 Obstructive sleep apnea (adult) (pediatric): Secondary | ICD-10-CM | POA: Diagnosis present

## 2024-02-17 DIAGNOSIS — Z79899 Other long term (current) drug therapy: Secondary | ICD-10-CM

## 2024-02-17 DIAGNOSIS — J9811 Atelectasis: Secondary | ICD-10-CM | POA: Diagnosis not present

## 2024-02-17 DIAGNOSIS — I5022 Chronic systolic (congestive) heart failure: Secondary | ICD-10-CM | POA: Diagnosis present

## 2024-02-17 DIAGNOSIS — Z91148 Patient's other noncompliance with medication regimen for other reason: Secondary | ICD-10-CM

## 2024-02-17 DIAGNOSIS — Z91128 Patient's intentional underdosing of medication regimen for other reason: Secondary | ICD-10-CM

## 2024-02-17 DIAGNOSIS — I472 Ventricular tachycardia, unspecified: Secondary | ICD-10-CM | POA: Diagnosis present

## 2024-02-17 DIAGNOSIS — M4802 Spinal stenosis, cervical region: Secondary | ICD-10-CM | POA: Diagnosis not present

## 2024-02-17 DIAGNOSIS — Z4502 Encounter for adjustment and management of automatic implantable cardiac defibrillator: Secondary | ICD-10-CM

## 2024-02-17 DIAGNOSIS — I428 Other cardiomyopathies: Secondary | ICD-10-CM | POA: Diagnosis present

## 2024-02-17 DIAGNOSIS — T500X6A Underdosing of mineralocorticoids and their antagonists, initial encounter: Secondary | ICD-10-CM | POA: Diagnosis present

## 2024-02-17 DIAGNOSIS — N1832 Chronic kidney disease, stage 3b: Secondary | ICD-10-CM | POA: Diagnosis present

## 2024-02-17 DIAGNOSIS — I4901 Ventricular fibrillation: Secondary | ICD-10-CM | POA: Diagnosis present

## 2024-02-17 DIAGNOSIS — S199XXA Unspecified injury of neck, initial encounter: Secondary | ICD-10-CM | POA: Diagnosis not present

## 2024-02-17 LAB — I-STAT CHEM 8, ED
BUN: 41 mg/dL — ABNORMAL HIGH (ref 6–20)
Calcium, Ion: 1.09 mmol/L — ABNORMAL LOW (ref 1.15–1.40)
Chloride: 90 mmol/L — ABNORMAL LOW (ref 98–111)
Creatinine, Ser: 2.3 mg/dL — ABNORMAL HIGH (ref 0.61–1.24)
Glucose, Bld: 130 mg/dL — ABNORMAL HIGH (ref 70–99)
HCT: 46 % (ref 39.0–52.0)
Hemoglobin: 15.6 g/dL (ref 13.0–17.0)
Potassium: 2.2 mmol/L — CL (ref 3.5–5.1)
Sodium: 135 mmol/L (ref 135–145)
TCO2: 31 mmol/L (ref 22–32)

## 2024-02-17 LAB — TROPONIN I (HIGH SENSITIVITY)
Troponin I (High Sensitivity): 49 ng/L — ABNORMAL HIGH (ref <18)
Troponin I (High Sensitivity): 56 ng/L — ABNORMAL HIGH (ref <18)

## 2024-02-17 LAB — CBC WITH DIFFERENTIAL/PLATELET
Abs Immature Granulocytes: 0.03 10*3/uL (ref 0.00–0.07)
Basophils Absolute: 0.1 10*3/uL (ref 0.0–0.1)
Basophils Relative: 1 %
Eosinophils Absolute: 0 10*3/uL (ref 0.0–0.5)
Eosinophils Relative: 0 %
HCT: 44.8 % (ref 39.0–52.0)
Hemoglobin: 14.2 g/dL (ref 13.0–17.0)
Immature Granulocytes: 0 %
Lymphocytes Relative: 7 %
Lymphs Abs: 0.6 10*3/uL — ABNORMAL LOW (ref 0.7–4.0)
MCH: 24.4 pg — ABNORMAL LOW (ref 26.0–34.0)
MCHC: 31.7 g/dL (ref 30.0–36.0)
MCV: 76.8 fL — ABNORMAL LOW (ref 80.0–100.0)
Monocytes Absolute: 1.1 10*3/uL — ABNORMAL HIGH (ref 0.1–1.0)
Monocytes Relative: 13 %
Neutro Abs: 7 10*3/uL (ref 1.7–7.7)
Neutrophils Relative %: 79 %
Platelets: 257 10*3/uL (ref 150–400)
RBC: 5.83 MIL/uL — ABNORMAL HIGH (ref 4.22–5.81)
RDW: 19.4 % — ABNORMAL HIGH (ref 11.5–15.5)
WBC: 8.8 10*3/uL (ref 4.0–10.5)
nRBC: 0 % (ref 0.0–0.2)

## 2024-02-17 LAB — COMPREHENSIVE METABOLIC PANEL WITH GFR
ALT: 52 U/L — ABNORMAL HIGH (ref 0–44)
ALT: 45 U/L — ABNORMAL HIGH (ref 0–44)
AST: 53 U/L — ABNORMAL HIGH (ref 15–41)
AST: 45 U/L — ABNORMAL HIGH (ref 15–41)
Albumin: 3 g/dL — ABNORMAL LOW (ref 3.5–5.0)
Albumin: 2.8 g/dL — ABNORMAL LOW (ref 3.5–5.0)
Alkaline Phosphatase: 112 U/L (ref 38–126)
Alkaline Phosphatase: 103 U/L (ref 38–126)
Anion gap: 16 — ABNORMAL HIGH (ref 5–15)
Anion gap: 12 (ref 5–15)
BUN: 44 mg/dL — ABNORMAL HIGH (ref 6–20)
BUN: 43 mg/dL — ABNORMAL HIGH (ref 6–20)
CO2: 29 mmol/L (ref 22–32)
CO2: 31 mmol/L (ref 22–32)
Calcium: 9.1 mg/dL (ref 8.9–10.3)
Calcium: 8.7 mg/dL — ABNORMAL LOW (ref 8.9–10.3)
Chloride: 90 mmol/L — ABNORMAL LOW (ref 98–111)
Chloride: 92 mmol/L — ABNORMAL LOW (ref 98–111)
Creatinine, Ser: 2.35 mg/dL — ABNORMAL HIGH (ref 0.61–1.24)
Creatinine, Ser: 2.27 mg/dL — ABNORMAL HIGH (ref 0.61–1.24)
GFR, Estimated: 35 mL/min — ABNORMAL LOW (ref >60)
GFR, Estimated: 36 mL/min — ABNORMAL LOW (ref >60)
Glucose, Bld: 133 mg/dL — ABNORMAL HIGH (ref 70–99)
Glucose, Bld: 136 mg/dL — ABNORMAL HIGH (ref 70–99)
Potassium: 2.2 mmol/L — CL (ref 3.5–5.1)
Potassium: 2.4 mmol/L — CL (ref 3.5–5.1)
Sodium: 135 mmol/L (ref 135–145)
Sodium: 135 mmol/L (ref 135–145)
Total Bilirubin: 3.1 mg/dL — ABNORMAL HIGH (ref 0.0–1.2)
Total Bilirubin: 3 mg/dL — ABNORMAL HIGH (ref 0.0–1.2)
Total Protein: 6.7 g/dL (ref 6.5–8.1)
Total Protein: 6.4 g/dL — ABNORMAL LOW (ref 6.5–8.1)

## 2024-02-17 LAB — MAGNESIUM: Magnesium: 2.3 mg/dL (ref 1.7–2.4)

## 2024-02-17 LAB — ETHANOL: Alcohol, Ethyl (B): <10 mg/dL (ref <10)

## 2024-02-17 LAB — BRAIN NATRIURETIC PEPTIDE: B Natriuretic Peptide: 304.9 pg/mL — ABNORMAL HIGH (ref 0.0–100.0)

## 2024-02-17 MED ORDER — SODIUM CHLORIDE 0.9% FLUSH
3.0000 mL | Freq: Two times a day (BID) | INTRAVENOUS | Status: DC
  Administered 2024-02-17 – 2024-02-22 (×8): 3 mL via INTRAVENOUS

## 2024-02-17 MED ORDER — POTASSIUM CHLORIDE 10 MEQ/100ML IV SOLN
10.0000 meq | INTRAVENOUS | Status: AC
  Administered 2024-02-17 (×2): 10 meq via INTRAVENOUS
  Filled 2024-02-17 (×2): qty 100

## 2024-02-17 MED ORDER — LEVOTHYROXINE SODIUM 50 MCG PO TABS
50.0000 ug | ORAL_TABLET | Freq: Every day | ORAL | Status: DC
  Administered 2024-02-18 – 2024-02-22 (×5): 50 ug via ORAL
  Filled 2024-02-17 (×5): qty 1

## 2024-02-17 MED ORDER — POTASSIUM CHLORIDE CRYS ER 20 MEQ PO TBCR
40.0000 meq | EXTENDED_RELEASE_TABLET | Freq: Three times a day (TID) | ORAL | Status: DC
  Administered 2024-02-18 – 2024-02-22 (×13): 40 meq via ORAL
  Filled 2024-02-17 (×13): qty 2

## 2024-02-17 MED ORDER — ACETAMINOPHEN 325 MG PO TABS
650.0000 mg | ORAL_TABLET | ORAL | Status: DC | PRN
  Filled 2024-02-17: qty 2

## 2024-02-17 MED ORDER — ATORVASTATIN CALCIUM 40 MG PO TABS
40.0000 mg | ORAL_TABLET | Freq: Every day | ORAL | Status: DC
  Administered 2024-02-18 – 2024-02-22 (×5): 40 mg via ORAL
  Filled 2024-02-17 (×5): qty 1

## 2024-02-17 MED ORDER — POTASSIUM CHLORIDE CRYS ER 20 MEQ PO TBCR
40.0000 meq | EXTENDED_RELEASE_TABLET | Freq: Once | ORAL | Status: AC
  Administered 2024-02-17: 40 meq via ORAL
  Filled 2024-02-17: qty 2

## 2024-02-17 MED ORDER — MEXILETINE HCL 150 MG PO CAPS
150.0000 mg | ORAL_CAPSULE | Freq: Two times a day (BID) | ORAL | Status: DC
  Administered 2024-02-17 – 2024-02-22 (×10): 150 mg via ORAL
  Filled 2024-02-17 (×10): qty 1

## 2024-02-17 MED ORDER — SPIRONOLACTONE 25 MG PO TABS
50.0000 mg | ORAL_TABLET | Freq: Every day | ORAL | Status: DC
  Administered 2024-02-18 – 2024-02-22 (×5): 50 mg via ORAL
  Filled 2024-02-17 (×5): qty 2

## 2024-02-17 MED ORDER — ONDANSETRON HCL 4 MG/2ML IJ SOLN: 4.0000 mg | Freq: Four times a day (QID) | INTRAMUSCULAR | Status: DC | PRN

## 2024-02-17 MED ORDER — EMPAGLIFLOZIN 10 MG PO TABS
10.0000 mg | ORAL_TABLET | Freq: Every day | ORAL | Status: DC
  Administered 2024-02-18 – 2024-02-22 (×5): 10 mg via ORAL
  Filled 2024-02-17 (×6): qty 1

## 2024-02-17 MED ORDER — PANTOPRAZOLE SODIUM 40 MG PO TBEC
40.0000 mg | DELAYED_RELEASE_TABLET | Freq: Every day | ORAL | Status: DC
  Administered 2024-02-18 – 2024-02-22 (×5): 40 mg via ORAL
  Filled 2024-02-17 (×5): qty 1

## 2024-02-17 MED ORDER — AMIODARONE HCL 200 MG PO TABS
200.0000 mg | ORAL_TABLET | Freq: Two times a day (BID) | ORAL | Status: DC
  Administered 2024-02-17 – 2024-02-22 (×10): 200 mg via ORAL
  Filled 2024-02-17 (×10): qty 1

## 2024-02-17 MED ORDER — SODIUM CHLORIDE 0.9 % IV SOLN: 250.0000 mL | INTRAVENOUS | Status: AC | PRN

## 2024-02-17 MED ORDER — SODIUM CHLORIDE 0.9% FLUSH
3.0000 mL | INTRAVENOUS | Status: DC | PRN
  Administered 2024-02-21: 3 mL via INTRAVENOUS

## 2024-02-17 MED ORDER — APIXABAN 5 MG PO TABS
5.0000 mg | ORAL_TABLET | Freq: Two times a day (BID) | ORAL | Status: DC
  Administered 2024-02-17 – 2024-02-22 (×10): 5 mg via ORAL
  Filled 2024-02-17 (×10): qty 1

## 2024-02-17 NOTE — H&P (Signed)
 Cardiology Admission History and Physical   Patient ID: Bradley Powell MRN: [id number]; DOB: [birth year]   Admission date: 02/17/2024  PCP:  [name], MD   [facility] Providers Cardiologist:  None  Electrophysiologist:  Lewayne Bunting, MD  Advanced Heart Failure:  Arvilla Meres, MD  Sleep Medicine:  Armanda Magic, MD       Chief Complaint:  syncope  Patient Profile:   Bradley Powell is a 43 y.o. male with heart failure who is being seen 02/17/2024 for the evaluation of syncope.  History of Present Illness:   [name] sees Dr. [name] in clinic for combined systolic (40%) and diastolic HF as well as prior AFL ablation, CKD, and prior VT.  Was at home watching basketball tonight when he suddenly passed out.  He felt a little lightheaded before syncope, and called out to his family, but had LOC and knocked his head against the ground. Family witnessed what might be a defibrillator shock w/ a jerk in his chest area.  He quickly recovered with intact mental status.  On arrival in ED, hemodynamically stable without cardiac complaints.    No chest pain, no recent overt changes in fluid status Does torsemide twice daily with metolazone Wed and Friday, and takes K 40 3x/day, reports adherence. [facility] out of sp[facility]one so he's been out of that for a week. Otherwise adherent to his meds  K in ED was 2.2 Device being interrogated. Currently feeling well, no palpitations.   Past Medical History:  Diagnosis Date   Atrial flutter (HCC)    a. s/p ablation 03/2018.   Chronic combined systolic and diastolic CHF (congestive heart failure) (HCC)    History of esophagogastroduodenoscopy (EGD)    Morbid obesity (HCC)    NICM (nonischemic cardiomyopathy) (HCC)    OSA on CPAP    mild OSA with an AHI of 7.7/hr on auto CPAP   PVC's (premature ventricular contractions)     Past Surgical History:  Procedure Laterality Date   A-FLUTTER ABLATION N/A 04/06/2018    Procedure: A-FLUTTER ABLATION;  Surgeon: Marinus Maw, MD;  Location: [facility] INVASIVE CV LAB;  Service: Cardiovascular;  Laterality: N/A;   BIOPSY  07/11/2023   Procedure: BIOPSY;  Surgeon: Kathi Der, MD;  Location: [facility] ENDOSCOPY;  Service: Gastroenterology;;   ESOPHAGEAL BRUSHING  06/19/2020   Procedure: ESOPHAGEAL BRUSHING;  Surgeon: Charlott Rakes, MD;  Location: [facility] ENDOSCOPY;  Service: Endoscopy;;   ESOPHAGOGASTRODUODENOSCOPY (EGD) WITH PROPOFOL Left 06/19/2020   Procedure: ESOPHAGOGASTRODUODENOSCOPY (EGD) WITH PROPOFOL;  Surgeon: Charlott Rakes, MD;  Location: [facility] ENDOSCOPY;  Service: Endoscopy;  Laterality: Left;   ESOPHAGOGASTRODUODENOSCOPY (EGD) WITH PROPOFOL N/A 07/11/2023   Procedure: ESOPHAGOGASTRODUODENOSCOPY (EGD) WITH PROPOFOL;  Surgeon: Kathi Der, MD;  Location: [facility] ENDOSCOPY;  Service: Gastroenterology;  Laterality: N/A;   ICD IMPLANT N/A 02/22/2022   Procedure: ICD IMPLANT;  Surgeon: Marinus Maw, MD;  Location: [facility] INVASIVE CV LAB;  Service: Cardiovascular;  Laterality: N/A;   RIGHT HEART CATH N/A 12/20/2019   Procedure: RIGHT HEART CATH;  Surgeon: Dolores Patty, MD;  Location: [facility] INVASIVE CV LAB;  Service: Cardiovascular;  Laterality: N/A;   RIGHT/LEFT HEART CATH AND CORONARY ANGIOGRAPHY N/A 03/01/2017   Procedure: Right/Left Heart Cath and Coronary Angiography;  Surgeon: Dolores Patty, MD;  Location: [facility] INVASIVE CV LAB;  Service: Cardiovascular;  Laterality: N/A;     Medications Prior to Admission: Prior to Admission medications   Medication Sig Start Date End Date Taking? Authorizing Provider  acetaminophen (  TYLENOL) 500 MG tablet Take 1,000 mg by mouth as needed for mild pain (pain score 1-3) (Leg pain).    [provider]  allopurinol (ZYLOPRIM) 100 MG tablet Take 1 tablet (100 mg total) by mouth daily. Please contact PCP for further refills 10/24/23   Zannie Cove, MD  amiodarone (PACERONE) 200 MG tablet Take 200 mg by mouth 2  (two) times daily.    [provider]  atorvastatin (LIPITOR) 40 MG tablet Take 1 tablet (40 mg total) by mouth daily. 12/01/23   Zannie Cove, MD  diclofenac Sodium (VOLTAREN) 1 % GEL Apply 2 g topically daily as needed (pain).    [provider]  ELIQUIS 5 MG TABS tablet TAKE 1 TABLET BY MOUTH TWICE A DAY 08/17/23   Bensimhon, Bevelyn Buckles, MD  empagliflozin (JARDIANCE) 10 MG TABS tablet Take 1 tablet (10 mg total) by mouth daily. 12/28/23   Bensimhon, Bevelyn Buckles, MD  esomeprazole (NEXIUM) 40 MG capsule Take 40 mg by mouth 2 (two) times daily. 01/08/22   [provider]  levothyroxine (SYNTHROID) 50 MCG tablet TAKE 1 TABLET (50 MCG TOTAL) BY MOUTH DAILY BEFORE BREAKFAST. TAKE 1 HOUR BEFORE BREAKFAST 10/17/23   Milford, [place], FNP  metolazone (ZAROXOLYN) 5 MG tablet Take 1 tablet (5 mg total) by mouth 2 (two) times a week. Every Sunday and Wednesday 12/21/23   Bensimhon, Bevelyn Buckles, MD  mexiletine (MEXITIL) 150 MG capsule Take 1 capsule (150 mg total) by mouth every 12 (twelve) hours. 08/09/23   Milford, Anderson Malta, FNP  polyethylene glycol powder (GLYCOLAX/MIRALAX) 17 GM/SCOOP powder Take 1 CAPFUL (17 g) by mouth daily as needed. 10/24/23   Zannie Cove, MD  potassium chloride SA (KLOR-CON M) 20 MEQ tablet Take 2 tablets (40 mEq total) by mouth 3 (three) times daily. Take extra 2 tabs when you take metolazone 01/17/24   Clegg, Amy D, NP  sodium chloride (OCEAN) 0.65 % nasal spray Place 2 sprays into the nose as needed for congestion.    [provider]  spironolactone (ALDACTONE) 50 MG tablet Take 1 tablet (50 mg total) by mouth daily. 11/30/23   Zannie Cove, MD  torsemide (DEMADEX) 20 MG tablet Take 4 tablets (80mg )  in the morning, and 3 tablets (60mg ) in the afternoon 11/30/23   Zannie Cove, MD  UNABLE TO FIND Referral to [facility] -Please accept this patient as a referral for CKD 4 and diastolic CHF  Zannie Cove MD [facility]  10/24/23   Zannie Cove, MD     Allergies:    Allergies  Allergen Reactions   Entresto [Sacubitril-Valsartan] Nausea And Vomiting and Other (See Comments)    Lightheaded, fainting    Social History:   Social History   Socioeconomic History   Marital status: Single    Spouse name: Not on file   Number of children: Not on file   Years of education: Not on file   Highest education level: Not on file  Occupational History   Not on file  Tobacco Use   Smoking status: Never   Smokeless tobacco: Never  Vaping Use   Vaping status: Never Used  Substance and Sexual Activity   Alcohol use: Yes    Comment: rarely   Drug use: No   Sexual activity: Not Currently    Partners: Female  Other Topics Concern   Not on file  Social History Narrative   Not on file   Social Drivers of Health   Financial Resource Strain:  Not on file  Food Insecurity: No Food Insecurity (11/22/2023)   Hunger Vital Sign    Worried About Running Out of Food in the Last Year: Never true    Ran Out of Food in the Last Year: Never true  Transportation Needs: No Transportation Needs (11/22/2023)   PRAPARE - Administrator, Civil Service (Medical): No    Lack of Transportation (Non-Medical): No  Physical Activity: Not on file  Stress: Not on file  Social Connections: Socially Isolated (11/22/2023)   Social Connection and Isolation Panel [NHANES]    Frequency of Communication with Friends and Family: Never    Frequency of Social Gatherings with Friends and Family: Never    Attends Religious Services: Never    Database administrator or Organizations: No    Attends Banker Meetings: Never    Marital Status: Never married  Intimate Partner Violence: Not At Risk (11/22/2023)   Humiliation, Afraid, Rape, and Kick questionnaire    Fear of Current or Ex-Partner: No    Emotionally Abused: No    Physically Abused: No    Sexually Abused: No    Family History:   The patient's family history  includes CVA in his mother; Gout in his father; Hypertension in his father; Multiple sclerosis in his mother; Seizures in his mother.    ROS:  Please see the history of present illness.  All other ROS reviewed and negative.     Physical Exam/Data:   Vitals:   02/17/24 2015 02/17/24 2030 02/17/24 2045 02/17/24 2114  BP: 106/76 94/63 (!) 117/92   Pulse: 84 72 73   Resp: (!) 29 (!) 22 19   Temp:    98.8 F (37.1 C)  TempSrc:    Oral  SpO2: 96% 97% 96%   Weight:      Height:       No intake or output data in the 24 hours ending 02/17/24 2136    02/17/2024    7:34 PM 01/16/2024    3:11 PM 12/19/2023    2:18 PM  Last 3 Weights  Weight (lbs) 240 lb 248 lb 9.6 oz 251 lb 12.8 oz  Weight (kg) 108.863 kg 112.764 kg 114.216 kg     Body mass index is 36.49 kg/m.  General:  Well nourished, well developed, in no acute distress HEENT: normal Neck: JVD not examined, in neck collar Vascular: No carotid bruits; Distal pulses 2+ bilaterally   Cardiac:  normal S1, S2; RRR; no murmur  Lungs:  clear to auscultation bilaterally, no wheezing, rhonchi or rales  Abd: soft, nontender, no hepatomegaly  Ext: trace edema Musculoskeletal:  No deformities, BUE and BLE strength normal and equal Skin: warm and dry  Neuro:  CNs 2-12 intact, no focal abnormalities noted Psych:  Normal affect    EKG:  The ECG that was done tonight was personally reviewed and demonstrates AV dissociation with a ventricular escape rate of 70.  Laboratory Data:  High Sensitivity Troponin:   Recent Labs  Lab 02/17/24 1936  TROPONINIHS 49*      Chemistry Recent Labs  Lab 02/17/24 1936 02/17/24 1953  NA 135 135  K 2.2* 2.2*  CL 90* 90*  CO2 29  --   GLUCOSE 133* 130*  BUN 44* 41*  CREATININE 2.35* 2.30*  CALCIUM 9.1  --   GFRNONAA 35*  --   ANIONGAP 16*  --     Recent Labs  Lab 02/17/24 1936  PROT 6.7  ALBUMIN  3.0*  AST 53*  ALT 52*  ALKPHOS 112  BILITOT 3.1*   Hematology Recent Labs  Lab  02/17/24 1936 02/17/24 1953  WBC 8.8  --   RBC 5.83*  --   HGB 14.2 15.6  HCT 44.8 46.0  MCV 76.8*  --   MCH 24.4*  --   MCHC 31.7  --   RDW 19.4*  --   PLT 257  --    Thyroid No results for input(s): "TSH", "FREET4" in the last 168 hours. BNP Recent Labs  Lab 02/17/24 1938  BNP 304.9*    DDimer No results for input(s): "DDIMER" in the last 168 hours.   Radiology/Studies:  DG Chest Port 1 View Result Date: 02/17/2024 CLINICAL DATA:  Syncope EXAM: PORTABLE CHEST 1 VIEW COMPARISON:  11/21/2023 FINDINGS: Lung volumes are slightly small and there is resultant vascular crowding at the hila, likely related to separate erect positioning. Lungs are clear save for mild left basilar atelectasis. No pneumothorax or pleural effusion. Stable cardiomegaly when accounting for poor pulmonary insufflation. Left subclavian single lead pacemaker defibrillator is unchanged. No acute bone abnormality IMPRESSION: 1. Poor pulmonary insufflation. Mild left basilar atelectasis. 2. Stable cardiomegaly. Electronically Signed   By: Helyn Numbers M.D.   On: 02/17/2024 20:12     Assessment and Plan:   Syncope with possible VT triggering defibrillation and syncope.  ICD interrogation pending. Presently feeling well. All this may be due to the low potassium (2.2 confirmed) in light of a week of pausing spironolactone.  In the ED, given 40 PO and 10 IV x 2 of potassium (the first infusion is still going) Will add back spironolactone. Repeat ECG in the morning  Will admit for observation and keep until K in safer range.    Risk Assessment/Risk Scores:          Code Status: Full Code  Severity of Illness: The appropriate patient status for this patient is OBSERVATION. Observation status is judged to be reasonable and necessary in order to provide the required intensity of service to ensure the patient's safety. The patient's presenting symptoms, physical exam findings, and initial radiographic and  laboratory data in the context of their medical condition is felt to place them at decreased risk for further clinical deterioration. Furthermore, it is anticipated that the patient will be medically stable for discharge from the hospital within 2 midnights of admission.    For questions or updates, please contact [facility] Please consult [email or web address] for contact info under     Signed, Eyvonne Left, MD  02/17/2024 9:36 PM

## 2024-02-17 NOTE — ED Notes (Signed)
 CCMD called regarding having patient monitored.

## 2024-02-17 NOTE — ED Provider Notes (Signed)
 Bradley Powell Provider Note   CSN: [id number] Arrival date & time: 02/17/24  1929     History {Add pertinent medical, surgical, social history, OB history to HPI:1} Chief Complaint  Patient presents with   Loss of Consciousness    Bradley Powell is a 43 y.o. male.   Loss of Consciousness      Home Medications Prior to Admission medications   Medication Sig Start Date End Date Taking? Authorizing Provider  acetaminophen (TYLENOL) 500 MG tablet Take 1,000 mg by mouth as needed for mild pain (pain score 1-3) (Leg pain).    [provider]  allopurinol (ZYLOPRIM) 100 MG tablet Take 1 tablet (100 mg total) by mouth daily. Please contact PCP for further refills 10/24/23   Zannie Cove, MD  amiodarone (PACERONE) 200 MG tablet Take 200 mg by mouth 2 (two) times daily.    [provider]  atorvastatin (LIPITOR) 40 MG tablet Take 1 tablet (40 mg total) by mouth daily. 12/01/23   Zannie Cove, MD  diclofenac Sodium (VOLTAREN) 1 % GEL Apply 2 g topically daily as needed (pain).    [provider]  ELIQUIS 5 MG TABS tablet TAKE 1 TABLET BY MOUTH TWICE A DAY 08/17/23   Bensimhon, Bevelyn Buckles, MD  empagliflozin (JARDIANCE) 10 MG TABS tablet Take 1 tablet (10 mg total) by mouth daily. 12/28/23   Bensimhon, Bevelyn Buckles, MD  esomeprazole (NEXIUM) 40 MG capsule Take 40 mg by mouth 2 (two) times daily. 01/08/22   [provider]  levothyroxine (SYNTHROID) 50 MCG tablet TAKE 1 TABLET (50 MCG TOTAL) BY MOUTH DAILY BEFORE BREAKFAST. TAKE 1 HOUR BEFORE BREAKFAST 10/17/23   Milford, [place], FNP  metolazone (ZAROXOLYN) 5 MG tablet Take 1 tablet (5 mg total) by mouth 2 (two) times a week. Every Sunday and Wednesday 12/21/23   Bensimhon, Bevelyn Buckles, MD  mexiletine (MEXITIL) 150 MG capsule Take 1 capsule (150 mg total) by mouth every 12 (twelve) hours. 08/09/23   Milford, Anderson [place], FNP  polyethylene glycol powder (GLYCOLAX/MIRALAX)  17 GM/SCOOP powder Take 1 CAPFUL (17 g) by mouth daily as needed. 10/24/23   Zannie Cove, MD  potassium chloride SA (KLOR-CON M) 20 MEQ tablet Take 2 tablets (40 mEq total) by mouth 3 (three) times daily. Take extra 2 tabs when you take metolazone 01/17/24   Clegg, Amy D, NP  sodium chloride (OCEAN) 0.65 % nasal spray Place 2 sprays into the nose as needed for congestion.    [provider]  spironolactone (ALDACTONE) 50 MG tablet Take 1 tablet (50 mg total) by mouth daily. 11/30/23   Zannie Cove, MD  torsemide (DEMADEX) 20 MG tablet Take 4 tablets (80mg )  in the morning, and 3 tablets (60mg ) in the afternoon 11/30/23   Zannie Cove, MD  UNABLE TO FIND Referral to [facility] -Please accept this patient as a referral for CKD 4 and diastolic CHF  Zannie Cove MD [facility] 10/24/23   Zannie Cove, MD      Allergies    Sherryll Burger [sacubitril-valsartan]    Review of Systems   Review of Systems  Cardiovascular:  Positive for syncope.    Physical Exam Updated Vital Signs BP 106/76   Pulse 84   Resp (!) 29   Ht 5\' 8"  (1.727 m)   Wt 108.9 kg   SpO2 96%   BMI 36.49 kg/m  Physical Exam  ED Results / Procedures / Treatments   Labs (all  labs ordered are listed, but only abnormal results are displayed) Labs Reviewed  CBC WITH DIFFERENTIAL/PLATELET - Abnormal; Notable for the following components:      Result Value   RBC 5.83 (*)    MCV 76.8 (*)    MCH 24.4 (*)    RDW 19.4 (*)    Lymphs Abs 0.6 (*)    Monocytes Absolute 1.1 (*)    All other components within normal limits  COMPREHENSIVE METABOLIC PANEL WITH GFR - Abnormal; Notable for the following components:   Potassium 2.2 (*)    Chloride 90 (*)    Glucose, Bld 133 (*)    BUN 44 (*)    Creatinine, Ser 2.35 (*)    Albumin 3.0 (*)    AST 53 (*)    ALT 52 (*)    Total Bilirubin 3.1 (*)    GFR, Estimated 35 (*)    Anion gap 16 (*)    All other components within normal limits  BRAIN  NATRIURETIC PEPTIDE - Abnormal; Notable for the following components:   B Natriuretic Peptide 304.9 (*)    All other components within normal limits  I-STAT CHEM 8, ED - Abnormal; Notable for the following components:   Potassium 2.2 (*)    Chloride 90 (*)    BUN 41 (*)    Creatinine, Ser 2.30 (*)    Glucose, Bld 130 (*)    Calcium, Ion 1.09 (*)    All other components within normal limits  TROPONIN I (HIGH SENSITIVITY) - Abnormal; Notable for the following components:   Troponin I (High Sensitivity) 49 (*)    All other components within normal limits  ETHANOL  TROPONIN I (HIGH SENSITIVITY)    EKG EKG Interpretation Date/Time:  Saturday February 17 2024 19:39:26 EDT Ventricular Rate:  71 PR Interval:  61 QRS Duration:  121 QT Interval:  388 QTC Calculation: 419 R Axis:   132  Text Interpretation: Sinus rhythm Short PR interval Nonspecific intraventricular conduction delay Consider anterolateral infarct Minimal ST depression, inferior leads Confirmed by Kristine Royal [phone]) on 02/17/2024 7:41:35 PM  Radiology DG Chest Port 1 View Result Date: 02/17/2024 CLINICAL DATA:  Syncope EXAM: PORTABLE CHEST 1 VIEW COMPARISON:  11/21/2023 FINDINGS: Lung volumes are slightly small and there is resultant vascular crowding at the hila, likely related to separate erect positioning. Lungs are clear save for mild left basilar atelectasis. No pneumothorax or pleural effusion. Stable cardiomegaly when accounting for poor pulmonary insufflation. Left subclavian single lead pacemaker defibrillator is unchanged. No acute bone abnormality IMPRESSION: 1. Poor pulmonary insufflation. Mild left basilar atelectasis. 2. Stable cardiomegaly. Electronically Signed   By: Helyn Numbers M.D.   On: 02/17/2024 20:12    Procedures Procedures  {Document cardiac monitor, telemetry assessment procedure when appropriate:1}  Medications Ordered in ED Medications  potassium chloride 10 mEq in 100 mL IVPB (10 mEq  Intravenous New Bag/Given 02/17/24 2100)  spironolactone (ALDACTONE) tablet 50 mg (has no administration in time range)  potassium chloride SA (KLOR-CON M) CR tablet 40 mEq (40 mEq Oral Given 02/17/24 2100)    ED Course/ Medical Decision Making/ A&P   {   Click here for ABCD2, HEART and other calculatorsREFRESH Note before signing :1}                              Medical Decision Making Amount and/or Complexity of Data Reviewed Labs: ordered. Radiology: ordered.  Risk Prescription drug management. Decision  regarding hospitalization.   ***  {Document critical care time when appropriate:1} {Document review of labs and clinical decision tools ie heart score, Chads2Vasc2 etc:1}  {Document your independent review of radiology images, and any outside records:1} {Document your discussion with family members, caretakers, and with consultants:1} {Document social determinants of health affecting pt's care:1} {Document your decision making why or why not admission, treatments were needed:1} Final Clinical Impression(s) / ED Diagnoses Final diagnoses:  Syncope, unspecified syncope type  Hypokalemia    Rx / DC Orders ED Discharge Orders     None

## 2024-02-17 NOTE — ED Triage Notes (Signed)
 Patient arrives via [facility] from home after syncopal episode with head trauma. Patient reports feeling dizzy, clammy, and having blurred vision before syncope. Family report seeing movement in patient chest while patient was unresponsive, unsure if patient's defibrillator fired. On eliquis for afib. Complaining of neck pain and shoulder pain.  BP 100/60 - orthostatic negative HR 34-74

## 2024-02-18 DIAGNOSIS — I5042 Chronic combined systolic (congestive) and diastolic (congestive) heart failure: Secondary | ICD-10-CM

## 2024-02-18 DIAGNOSIS — I472 Ventricular tachycardia, unspecified: Secondary | ICD-10-CM | POA: Diagnosis not present

## 2024-02-18 DIAGNOSIS — E876 Hypokalemia: Principal | ICD-10-CM

## 2024-02-18 DIAGNOSIS — I4891 Unspecified atrial fibrillation: Secondary | ICD-10-CM | POA: Diagnosis not present

## 2024-02-18 DIAGNOSIS — I4892 Unspecified atrial flutter: Secondary | ICD-10-CM | POA: Diagnosis not present

## 2024-02-18 LAB — BASIC METABOLIC PANEL WITH GFR
Anion gap: 13 (ref 5–15)
Anion gap: 13 (ref 5–15)
BUN: 40 mg/dL — ABNORMAL HIGH (ref 6–20)
BUN: 40 mg/dL — ABNORMAL HIGH (ref 6–20)
CO2: 28 mmol/L (ref 22–32)
CO2: 26 mmol/L (ref 22–32)
Calcium: 8.9 mg/dL (ref 8.9–10.3)
Calcium: 9 mg/dL (ref 8.9–10.3)
Chloride: 92 mmol/L — ABNORMAL LOW (ref 98–111)
Chloride: 95 mmol/L — ABNORMAL LOW (ref 98–111)
Creatinine, Ser: 2.24 mg/dL — ABNORMAL HIGH (ref 0.61–1.24)
Creatinine, Ser: 2.01 mg/dL — ABNORMAL HIGH (ref 0.61–1.24)
GFR, Estimated: 37 mL/min — ABNORMAL LOW (ref >60)
GFR, Estimated: 42 mL/min — ABNORMAL LOW (ref >60)
Glucose, Bld: 201 mg/dL — ABNORMAL HIGH (ref 70–99)
Glucose, Bld: 149 mg/dL — ABNORMAL HIGH (ref 70–99)
Potassium: 2.4 mmol/L — CL (ref 3.5–5.1)
Potassium: 3.2 mmol/L — ABNORMAL LOW (ref 3.5–5.1)
Sodium: 133 mmol/L — ABNORMAL LOW (ref 135–145)
Sodium: 134 mmol/L — ABNORMAL LOW (ref 135–145)

## 2024-02-18 LAB — MAGNESIUM: Magnesium: 2.2 mg/dL (ref 1.7–2.4)

## 2024-02-18 MED ORDER — POTASSIUM CHLORIDE 10 MEQ/100ML IV SOLN
10.0000 meq | INTRAVENOUS | Status: AC
  Administered 2024-02-18 (×3): 10 meq via INTRAVENOUS
  Filled 2024-02-18 (×4): qty 100

## 2024-02-18 MED ORDER — POTASSIUM CHLORIDE 20 MEQ PO PACK
40.0000 meq | PACK | Freq: Once | ORAL | Status: AC
  Administered 2024-02-18: 40 meq via ORAL
  Filled 2024-02-18: qty 2

## 2024-02-18 MED ORDER — DICLOFENAC SODIUM 1 % EX GEL
2.0000 g | Freq: Four times a day (QID) | CUTANEOUS | Status: DC | PRN
  Administered 2024-02-18 – 2024-02-21 (×6): 2 g via TOPICAL
  Filled 2024-02-18: qty 100

## 2024-02-18 NOTE — Plan of Care (Signed)
   Problem: Education: Goal: Knowledge of General Education information will improve Description: Including pain rating scale, medication(s)/side effects and non-pharmacologic comfort measures Outcome: Progressing   Problem: Clinical Measurements: Goal: Will remain free from infection Outcome: Progressing   Problem: Clinical Measurements: Goal: Diagnostic test results will improve Outcome: Progressing

## 2024-02-18 NOTE — Consult Note (Signed)
   Electrophysiology consultation   Patient ID: Bradley Powell MRN: [id number]; DOB: 1981-03-28  Admit date: 02/17/2024 Date of Consult: 02/18/2024  PCP:  [name], MD   History of Present Illness:   Bradley Powell is a 43 year old man who I am seeing today for an evaluation of syncope at the request of Dr. Regino Schultze.  The patient has a history of chronic combined systolic and diastolic heart failure, atrial flutter, CKD and ventricular tachycardia.  The patient also has a history of medication noncompliance.  He was admitted in January of this year after stopping his potassium supplementation.  This led to critically low potassium levels and ventricular fibrillation requiring ICD shock.  He tells me that he was at home and suddenly lost consciousness while watching television.  This was witnessed by family.  He tells me that he ran out of his spironolactone.  He said he "just did not get to it".  He knew that he was running out and the pharmacy had to order the medication.  When it arrived at the pharmacy he did not go to pick it up.   Past medical, surgical, social and family history reviewed.  ROS:  Please see the history of present illness.  All other ROS reviewed and negative.     Physical Exam/Data:   Vitals:   02/18/24 0100 02/18/24 0135 02/18/24 0220 02/18/24 0441  BP: 98/81  106/67 103/77  Pulse: 83  73 76  Resp: 12  18   Temp:  (!) 97.5 F (36.4 C) 98.2 F (36.8 C) 97.6 F (36.4 C)  TempSrc:  Oral Oral Oral  SpO2: 100%  98% 100%  Weight:   110.8 kg   Height:   5\' 8"  (1.727 m)     General:  Well nourished, well developed, in no acute distress.  Laying flat in bed. Cardiac:  normal S1, S2; RRR; no murmur.  ICD pocket healing well. Lungs:  clear to auscultation bilaterally, no wheezing, rhonchi or rales  Psych:  Normal affect   EKG:  The EKG was personally reviewed and demonstrates: EKG shows sinus rhythm with a competing junctional rhythm.   Telemetry:   Telemetry was personally reviewed and demonstrates: Sinus rhythm with a competing junctional rhythm.   Device interrogation personally reviewed from [employer].  VT event shows appropriate detection of ventricular tachycardia treated with ATP unsuccessfully.  Ultimately received a shock that converted in back to normal rhythm.  Otherwise stable lead and battery parameters.   Assessment and Plan:   #Hypokalemia #Ventricular tachycardia/fibrillation #High-voltage therapy The patient has received another appropriate high-voltage therapy from his defibrillator because of severe hypokalemia.  This is a similar presentation to the one he had in January of this year.  This is all secondary to medication regimen noncompliance.  Replete potassium throughout the day today.  This will need to normalize prior to him being able to discharge safely.  Continue mexiletine and amiodarone  #Junctional rhythm Appears to be a new finding for him.  Will need to monitor this as his potassium normalizes.  #History of atrial fibrillation and flutter Continue Eliquis  #Chronic combined systolic and diastolic heart failure NYHA class II today.  Warm dry on exam. Hold metolazone and torsemide while potassium is being repleted.  He appears euvolemic on exam. Continue spironolactone   Sheria Lang T. Lalla Brothers, MD, [facility], [facility] Cardiac Electrophysiology

## 2024-02-18 NOTE — Progress Notes (Signed)
 5pm BMET/Mg pending, signed out to on call fellow to follow these up when resulted, care order placed for nurse to call when resulted as well.

## 2024-02-18 NOTE — Progress Notes (Signed)
 Attempted to page on call cardiology @ [phone] to report labs results

## 2024-02-19 DIAGNOSIS — I428 Other cardiomyopathies: Secondary | ICD-10-CM | POA: Diagnosis not present

## 2024-02-19 DIAGNOSIS — Z8249 Family history of ischemic heart disease and other diseases of the circulatory system: Secondary | ICD-10-CM | POA: Diagnosis not present

## 2024-02-19 DIAGNOSIS — T500X6A Underdosing of mineralocorticoids and their antagonists, initial encounter: Secondary | ICD-10-CM | POA: Diagnosis not present

## 2024-02-19 DIAGNOSIS — Z7984 Long term (current) use of oral hypoglycemic drugs: Secondary | ICD-10-CM | POA: Diagnosis not present

## 2024-02-19 DIAGNOSIS — I4891 Unspecified atrial fibrillation: Secondary | ICD-10-CM | POA: Diagnosis not present

## 2024-02-19 DIAGNOSIS — Z79890 Hormone replacement therapy (postmenopausal): Secondary | ICD-10-CM | POA: Diagnosis not present

## 2024-02-19 DIAGNOSIS — E876 Hypokalemia: Secondary | ICD-10-CM | POA: Diagnosis not present

## 2024-02-19 DIAGNOSIS — I440 Atrioventricular block, first degree: Secondary | ICD-10-CM | POA: Diagnosis not present

## 2024-02-19 DIAGNOSIS — I5022 Chronic systolic (congestive) heart failure: Secondary | ICD-10-CM | POA: Diagnosis not present

## 2024-02-19 DIAGNOSIS — Z91148 Patient's other noncompliance with medication regimen for other reason: Secondary | ICD-10-CM | POA: Diagnosis not present

## 2024-02-19 DIAGNOSIS — G4733 Obstructive sleep apnea (adult) (pediatric): Secondary | ICD-10-CM | POA: Diagnosis not present

## 2024-02-19 DIAGNOSIS — I4901 Ventricular fibrillation: Secondary | ICD-10-CM

## 2024-02-19 DIAGNOSIS — N1832 Chronic kidney disease, stage 3b: Secondary | ICD-10-CM | POA: Diagnosis not present

## 2024-02-19 DIAGNOSIS — Z79899 Other long term (current) drug therapy: Secondary | ICD-10-CM | POA: Diagnosis not present

## 2024-02-19 DIAGNOSIS — Z91128 Patient's intentional underdosing of medication regimen for other reason: Secondary | ICD-10-CM | POA: Diagnosis not present

## 2024-02-19 DIAGNOSIS — I472 Ventricular tachycardia, unspecified: Secondary | ICD-10-CM | POA: Diagnosis not present

## 2024-02-19 DIAGNOSIS — Z7901 Long term (current) use of anticoagulants: Secondary | ICD-10-CM | POA: Diagnosis not present

## 2024-02-19 DIAGNOSIS — Z4502 Encounter for adjustment and management of automatic implantable cardiac defibrillator: Secondary | ICD-10-CM | POA: Diagnosis not present

## 2024-02-19 LAB — BASIC METABOLIC PANEL WITH GFR
Anion gap: 12 (ref 5–15)
Anion gap: 12 (ref 5–15)
BUN: 40 mg/dL — ABNORMAL HIGH (ref 6–20)
BUN: 42 mg/dL — ABNORMAL HIGH (ref 6–20)
CO2: 24 mmol/L (ref 22–32)
CO2: 25 mmol/L (ref 22–32)
Calcium: 9 mg/dL (ref 8.9–10.3)
Calcium: 9.2 mg/dL (ref 8.9–10.3)
Chloride: 100 mmol/L (ref 98–111)
Chloride: 100 mmol/L (ref 98–111)
Creatinine, Ser: 2.03 mg/dL — ABNORMAL HIGH (ref 0.61–1.24)
Creatinine, Ser: 2.22 mg/dL — ABNORMAL HIGH (ref 0.61–1.24)
GFR, Estimated: 42 mL/min — ABNORMAL LOW (ref >60)
GFR, Estimated: 37 mL/min — ABNORMAL LOW (ref >60)
Glucose, Bld: 130 mg/dL — ABNORMAL HIGH (ref 70–99)
Glucose, Bld: 103 mg/dL — ABNORMAL HIGH (ref 70–99)
Potassium: 3.4 mmol/L — ABNORMAL LOW (ref 3.5–5.1)
Potassium: 3.9 mmol/L (ref 3.5–5.1)
Sodium: 136 mmol/L (ref 135–145)
Sodium: 137 mmol/L (ref 135–145)

## 2024-02-19 LAB — CUP PACEART REMOTE DEVICE CHECK
Battery Remaining Longevity: 150 mo
Battery Remaining Percentage: 100 %
Brady Statistic RV Percent Paced: 0 %
Date Time Interrogation Session: 20250405193700
HighPow Impedance: 66 Ohm
Implantable Lead Connection Status: 753985
Implantable Lead Implant Date: 20230411
Implantable Lead Location: 753860
Implantable Lead Model: 138
Implantable Lead Serial Number: 305338
Implantable Pulse Generator Implant Date: 20230411
Lead Channel Impedance Value: 476 Ohm
Lead Channel Setting Pacing Amplitude: 3 V
Lead Channel Setting Pacing Pulse Width: 0.8 ms
Lead Channel Setting Sensing Sensitivity: 0.6 mV
Pulse Gen Serial Number: 216862

## 2024-02-19 LAB — MAGNESIUM
Magnesium: 2.2 mg/dL (ref 1.7–2.4)
Magnesium: 2.3 mg/dL (ref 1.7–2.4)

## 2024-02-19 MED ORDER — POTASSIUM CHLORIDE CRYS ER 20 MEQ PO TBCR
40.0000 meq | EXTENDED_RELEASE_TABLET | Freq: Once | ORAL | Status: AC
  Administered 2024-02-19: 40 meq via ORAL
  Filled 2024-02-19: qty 2

## 2024-02-19 NOTE — Progress Notes (Signed)
 Heart Failure Navigator Progress Note  Assessed for Heart & Vascular TOC clinic readiness.  Patient does not meet criteria due to Advanced Heart Failure Team patient of Dr. [name]. .   Navigator will sign off at this time.   Rhae Hammock, BSN, Scientist, clinical (histocompatibility and immunogenetics) Only

## 2024-02-19 NOTE — Progress Notes (Signed)
  Patient Name: Bradley Powell Date of Encounter: 02/19/2024  Primary Cardiologist: None Electrophysiologist: Bradley Bunting, MD  Interval Summary   No complaints this am. Wants to know when he might be able to go home.  Reports he was out of his spiro; unclear amount of time. Pharmacy called on Thursday that it was available for pickup, but he didn't have a change to go by.   Vital Signs    Vitals:   02/18/24 2043 02/19/24 0123 02/19/24 0434 02/19/24 0800  BP: 99/60 106/78 100/67 (!) 100/59  Pulse: 70 76 76 74  Resp: 18 18 18 19   Temp: 97.8 F (36.6 C) (!) 97.4 F (36.3 C) 97.8 F (36.6 C) (!) 97.5 F (36.4 C)  TempSrc: Oral Oral Oral Oral  SpO2: 100% 100% 100% 100%  Weight:   112.7 kg   Height:        Intake/Output Summary (Last 24 hours) at 02/19/2024 0820 Last data filed at 02/19/2024 0438 Gross per 24 hour  Intake 840 ml  Output 1875 ml  Net -1035 ml   Filed Weights   02/17/24 1934 02/18/24 0220 02/19/24 0434  Weight: 108.9 kg 110.8 kg 112.7 kg    Physical Exam    GEN- The patient is well appearing, alert and oriented x 3 today.   Lungs- Clear to ausculation bilaterally, normal work of breathing Cardiac- Regular rate and rhythm, no murmurs, rubs or gallops GI- soft, NT, ND, + BS Extremities- no clubbing or cyanosis. No edema  Telemetry    NSR 70s (personally reviewed)  Hospital Course    Bradley Powell is a 43 year old man who I am seeing today for an evaluation of syncope at the request of Dr. Regino Powell.  The patient has a history of chronic combined systolic and diastolic heart failure, atrial flutter, CKD and ventricular tachycardia.  The patient also has a history of medication noncompliance.   Assessment & Plan    Hypokalemia VT/VF High voltage therapy Similar admission in January in setting of non-compliance Continue mexitil Continue amiodarone Labs pending this am.   Chronic combined CHF Volume status stable. Holding metolazone and torsemide while  K is being replaced.  Will need to make sure he was following orders to take extra K on metolazone days.    For questions or updates, please contact [facility] Please consult [email or web address] for contact info under Cardiology/STEMI.  Signed, Bradley Freer, PA-C  02/19/2024, 8:20 AM

## 2024-02-19 NOTE — Plan of Care (Signed)

## 2024-02-20 ENCOUNTER — Encounter: Payer: Self-pay | Admitting: Internal Medicine

## 2024-02-20 DIAGNOSIS — I472 Ventricular tachycardia, unspecified: Secondary | ICD-10-CM | POA: Diagnosis not present

## 2024-02-20 LAB — BASIC METABOLIC PANEL WITH GFR
Anion gap: 13 (ref 5–15)
BUN: 43 mg/dL — ABNORMAL HIGH (ref 6–20)
CO2: 21 mmol/L — ABNORMAL LOW (ref 22–32)
Calcium: 9.1 mg/dL (ref 8.9–10.3)
Chloride: 99 mmol/L (ref 98–111)
Creatinine, Ser: 2.13 mg/dL — ABNORMAL HIGH (ref 0.61–1.24)
GFR, Estimated: 39 mL/min — ABNORMAL LOW (ref >60)
Glucose, Bld: 99 mg/dL (ref 70–99)
Potassium: 4.2 mmol/L (ref 3.5–5.1)
Sodium: 133 mmol/L — ABNORMAL LOW (ref 135–145)

## 2024-02-20 LAB — MAGNESIUM: Magnesium: 2.1 mg/dL (ref 1.7–2.4)

## 2024-02-20 MED ORDER — TORSEMIDE 20 MG PO TABS
60.0000 mg | ORAL_TABLET | Freq: Every day | ORAL | Status: DC
  Administered 2024-02-20 – 2024-02-21 (×2): 60 mg via ORAL
  Filled 2024-02-20 (×2): qty 3

## 2024-02-20 MED ORDER — TORSEMIDE 20 MG PO TABS
80.0000 mg | ORAL_TABLET | Freq: Every day | ORAL | Status: DC
  Administered 2024-02-20 – 2024-02-22 (×3): 80 mg via ORAL
  Filled 2024-02-20 (×3): qty 4

## 2024-02-20 NOTE — Plan of Care (Signed)
 Cross Cover Note   Patient is requesting time off of monitoring to shower citing both musculoskeletal pain and sanitary concerns. Given admission for VT/VF (in setting of noncompliance), okay for pt to shower, however instructed to limit time off telemetry with quick showers and immediate return to tele.   Achille Rich, MD Cardiology

## 2024-02-20 NOTE — Progress Notes (Signed)
  Patient Name: Bradley Powell Date of Encounter: 02/20/2024  Primary Cardiologist: None Electrophysiologist: Lewayne Bunting, MD  Interval Summary   The patient is doing well today.  At this time, the patient denies chest pain, shortness of breath, or any new concerns.  Vital Signs    Vitals:   02/19/24 1937 02/20/24 0050 02/20/24 0459 02/20/24 0812  BP: 108/69  120/68 112/67  Pulse: 75  86 92  Resp: 19 19 18 20   Temp:  98.4 F (36.9 C) 99.4 F (37.4 C) 98.7 F (37.1 C)  TempSrc:  Oral Oral Oral  SpO2:  100% 94% 100%  Weight:   114.8 kg   Height:        Intake/Output Summary (Last 24 hours) at 02/20/2024 0915 Last data filed at 02/20/2024 0814 Gross per 24 hour  Intake 660 ml  Output 1000 ml  Net -340 ml   Filed Weights   02/18/24 0220 02/19/24 0434 02/20/24 0459  Weight: 110.8 kg 112.7 kg 114.8 kg    Physical Exam    GEN- The patient is well appearing, alert and oriented x 3 today.   Lungs- Clear to ausculation bilaterally, normal work of breathing Cardiac- Regular rate and rhythm, no murmurs, rubs or gallops GI- soft, NT, ND, + BS Extremities- no clubbing or cyanosis. No edema  Telemetry    NSR 70s (personally reviewed)  Hospital Course    Bradley Powell is a 43 year old man who I am seeing today for an evaluation of syncope at the request of Dr. Regino Schultze. The patient has a history of chronic combined systolic and diastolic heart failure, atrial flutter, CKD and ventricular tachycardia. The patient also has a history of medication noncompliance.   Assessment & Plan    Hypokalemia VT/VF High voltage therapy Similar admission in January in setting of non-compliance Continue mexitil Continue amiodarone Potassium4.2 (04/08 0741) Magnesium  2.1 (04/08 0741) Creatinine, ser  2.13* (04/08 0741)  Resume home torsemide today of 80 mg q am and 60 mg q pm and follow electrolytes.    Chronic combined CHF Volume status stable.  Reinforced importance of 40 meq extra  potassium on metolazone days.   Potentially home tomorrow on adjusted supp if electrolytes remains stable.    For questions or updates, please contact [facility] Please consult [email or web address] for contact info under Cardiology/STEMI.  Signed, Graciella Freer, PA-C  02/20/2024, 9:15 AM

## 2024-02-20 NOTE — Progress Notes (Signed)
 Pt refused standing weight check at this time.

## 2024-02-20 NOTE — TOC Initial Note (Signed)
 Transition of Care [facility]) - Initial/Assessment Note    Patient Details  Name: Bradley Powell MRN: [id number] Date of Birth: Aug 26, 1981  [facility]) CM/SW Contact:    Leone Haven, RN Phone Number: 02/20/2024, 12:46 PM  Clinical Narrative:                 From home alone, has PCP and insurance on file, states has no HH services in place at this time , cpap machine and scale at home.  States family member will transport them home at [employer] and family is support system, states [facility] [place].  Pta self ambulatory.   Expected Discharge Plan: Home/Self Care Barriers to Discharge: Continued Medical Work up   Patient Goals and CMS Choice Patient states their goals for this hospitalization and ongoing recovery are:: return home   Choice offered to / list presented to : NA      Expected Discharge Plan and Services   Discharge Planning Services: CM Consult Post Acute Care Choice: NA Living arrangements for the past 2 months: Single Family Home                 DME Arranged: N/A DME Agency: NA       HH Arranged: NA          Prior Living Arrangements/Services Living arrangements for the past 2 months: Single Family Home Lives with:: Self Patient language and need for interpreter reviewed:: Yes Do you feel safe going back to the place where you live?: Yes      Need for Family Participation in Patient Care: Yes (Comment) Care giver support system in place?: No (comment) Current home services: DME (cpap machine , scale) Criminal Activity/Legal Involvement Pertinent to Current Situation/Hospitalization: No - Comment as needed  Activities of Daily Living   ADL Screening (condition at time of admission) Independently performs ADLs?: Yes (appropriate for developmental age) Is the patient deaf or have difficulty hearing?: No Does the patient have difficulty seeing, even when wearing glasses/contacts?: No Does the patient have  difficulty concentrating, remembering, or making decisions?: No  Permission Sought/Granted Permission sought to share information with : Case Manager                Emotional Assessment Appearance:: Appears stated age Attitude/Demeanor/Rapport: Engaged Affect (typically observed): Appropriate Orientation: : Oriented to Self, Oriented to Place, Oriented to  Time, Oriented to Situation   Psych Involvement: No (comment)  Admission diagnosis:  Hypokalemia [E87.6] Syncope [R55] Syncope, unspecified syncope type [R55] Patient Active Problem List   Diagnosis Date Noted   Syncope 02/17/2024   Hypokalemia 11/21/2023   Acute kidney injury superimposed on chronic kidney disease (HCC) 10/13/2023   Leukocytosis 10/13/2023   Hypothyroidism 10/13/2023   Iron deficiency 10/13/2023   Acute on chronic congestive heart failure (HCC) 09/30/2023   Fluid overload 09/29/2023   Ventricular tachycardia (HCC) 05/26/2022   ICD (implantable cardioverter-defibrillator) in place 05/26/2022   Syncope and collapse 02/15/2022   Acute on chronic systolic (congestive) heart failure (HCC) 02/15/2022   Hepatic cirrhosis (HCC) 06/16/2020   Dehydration with hyponatremia 06/16/2020   Hypotension due to hypovolemia 06/16/2020   Intractable nausea and vomiting 06/16/2020   Acute kidney injury (HCC) 06/15/2020   Atrial fibrillation, chronic (HCC) 04/29/2020   Chronic combined systolic and diastolic congestive heart failure (HCC) 04/07/2018   NICM (nonischemic cardiomyopathy) (HCC) 04/07/2018   Hyperglycemia 04/07/2018   Typical atrial flutter (HCC) 04/06/2018   Paroxysmal atrial flutter (HCC)  04/06/2018   Gout 03/30/2017   Suspected sleep apnea 03/01/2017   Edema 02/24/2017   Testicular swelling 02/24/2017   CHF (congestive heart failure) (HCC) 02/24/2017   Overweight 02/24/2017   Costochondritis 02/24/2017   Sinus tachycardia 02/24/2017   First degree AV block 02/24/2017   Scrotal edema 02/24/2017    Obesity, class 2 02/24/2017   PCP:  [name], MD Pharmacy:   [phone] [address], [place] - [address] [place] [address] Phone: [phone] Fax: [phone]  [phone] [place] - [address] [place] [id number] Phone: [phone] Fax: [phone]  [facility] [address] [place] [address] Phone: [phone] Fax: [phone]  [facility] - [place], [place] - [address] [place] [address] Phone: [phone] Fax: [phone]     Social Drivers of Health (SDOH) Social History: SDOH Screenings   Food Insecurity: No Food Insecurity (02/18/2024)  Housing: Low Risk  (02/18/2024)  Transportation Needs: No Transportation Needs (02/18/2024)  Utilities: Not At Risk (02/18/2024)  Social Connections: Socially Isolated (11/22/2023)  Tobacco Use: Low Risk  (02/17/2024)   SDOH Interventions:     Readmission Risk Interventions    02/20/2024   12:41 PM 11/30/2023    2:37 PM  Readmission Risk Prevention Plan  Transportation Screening Complete Complete  PCP or Specialist Appt within 5-7 Days  Complete  PCP or Specialist Appt within 3-5 Days Complete   Home Care Screening  Complete  Medication Review (RN CM)  Complete  HRI or Home Care Consult Complete   Palliative Care Screening Not Applicable   Medication Review (RN Care Manager) Complete

## 2024-02-21 ENCOUNTER — Ambulatory Visit (INDEPENDENT_AMBULATORY_CARE_PROVIDER_SITE_OTHER): Payer: 59

## 2024-02-21 DIAGNOSIS — I440 Atrioventricular block, first degree: Secondary | ICD-10-CM | POA: Diagnosis not present

## 2024-02-21 DIAGNOSIS — I472 Ventricular tachycardia, unspecified: Secondary | ICD-10-CM | POA: Diagnosis not present

## 2024-02-21 DIAGNOSIS — Z4502 Encounter for adjustment and management of automatic implantable cardiac defibrillator: Secondary | ICD-10-CM

## 2024-02-21 DIAGNOSIS — I4901 Ventricular fibrillation: Secondary | ICD-10-CM | POA: Diagnosis not present

## 2024-02-21 LAB — BASIC METABOLIC PANEL WITH GFR
Anion gap: 12 (ref 5–15)
Anion gap: 13 (ref 5–15)
BUN: 40 mg/dL — ABNORMAL HIGH (ref 6–20)
BUN: 40 mg/dL — ABNORMAL HIGH (ref 6–20)
CO2: 24 mmol/L (ref 22–32)
CO2: 22 mmol/L (ref 22–32)
Calcium: 8.2 mg/dL — ABNORMAL LOW (ref 8.9–10.3)
Calcium: 8.1 mg/dL — ABNORMAL LOW (ref 8.9–10.3)
Chloride: 97 mmol/L — ABNORMAL LOW (ref 98–111)
Chloride: 97 mmol/L — ABNORMAL LOW (ref 98–111)
Creatinine, Ser: 2.26 mg/dL — ABNORMAL HIGH (ref 0.61–1.24)
Creatinine, Ser: 2.34 mg/dL — ABNORMAL HIGH (ref 0.61–1.24)
GFR, Estimated: 37 mL/min — ABNORMAL LOW (ref >60)
GFR, Estimated: 35 mL/min — ABNORMAL LOW (ref >60)
Glucose, Bld: 114 mg/dL — ABNORMAL HIGH (ref 70–99)
Glucose, Bld: 106 mg/dL — ABNORMAL HIGH (ref 70–99)
Potassium: 3.5 mmol/L (ref 3.5–5.1)
Potassium: 4.3 mmol/L (ref 3.5–5.1)
Sodium: 133 mmol/L — ABNORMAL LOW (ref 135–145)
Sodium: 132 mmol/L — ABNORMAL LOW (ref 135–145)

## 2024-02-21 LAB — MAGNESIUM: Magnesium: 1.9 mg/dL (ref 1.7–2.4)

## 2024-02-21 MED ORDER — MAGNESIUM SULFATE 2 GM/50ML IV SOLN
2.0000 g | Freq: Once | INTRAVENOUS | Status: AC
  Administered 2024-02-21: 2 g via INTRAVENOUS
  Filled 2024-02-21: qty 50

## 2024-02-21 MED ORDER — POTASSIUM CHLORIDE CRYS ER 20 MEQ PO TBCR
40.0000 meq | EXTENDED_RELEASE_TABLET | Freq: Once | ORAL | Status: AC
  Administered 2024-02-21: 40 meq via ORAL
  Filled 2024-02-21: qty 2

## 2024-02-21 NOTE — Progress Notes (Addendum)
 Patient Name: JAMIEN CASANOVA Date of Encounter: 02/21/2024  Primary Cardiologist: None Electrophysiologist: Lewayne Bunting, MD  Interval Summary   Very anxious about going home. K dropped overnight but 4.3 later in the am.   Vital Signs    Vitals:   02/20/24 1559 02/20/24 2014 02/21/24 0054 02/21/24 0508  BP: 109/85 96/69 116/83 96/63  Pulse: 80 86 77 76  Resp: 20 20 20 20   Temp: 97.6 F (36.4 C) (!) 97.4 F (36.3 C) 97.9 F (36.6 C) 97.9 F (36.6 C)  TempSrc: Oral Oral Oral Oral  SpO2: 100% 100% 100% 98%  Weight:    115 kg  Height:        Intake/Output Summary (Last 24 hours) at 02/21/2024 1404 Last data filed at 02/21/2024 1330 Gross per 24 hour  Intake 723 ml  Output 1600 ml  Net -877 ml   Filed Weights   02/19/24 0434 02/20/24 0459 02/21/24 0508  Weight: 112.7 kg 114.8 kg 115 kg    Physical Exam    GEN- The patient is well appearing, alert and oriented x 3 today.   Lungs- Clear to ausculation bilaterally, normal work of breathing Cardiac- Regular rate and rhythm, no murmurs, rubs or gallops GI- soft, NT, ND, + BS Extremities- no clubbing or cyanosis. No edema  Telemetry    NSR 70-80s (personally reviewed)  Hospital Course     Mr. Mosley is a 43 year old man who I am seeing today for an evaluation of syncope at the request of Dr. Regino Schultze. The patient has a history of chronic combined systolic and diastolic heart failure, atrial flutter, CKD and ventricular tachycardia. The patient also has a history of medication noncompliance.   Assessment & Plan    Hypokalemia VT/VF High voltage therapy Similar admission in January in setting of non-compliance Continue mexitil Continue amiodarone Potassium4.3 (04/09 1151) Magnesium  1.9 (04/09 0225) Creatinine, ser  2.34* (04/09 1151) Keep K > 4.0 and Mg > 2.0  Initial drop overnight but otherwise stable back on po diuretics.    Chronic combined CHF Volume status stable.  Reinforced importance of 40 meq  extra potassium on metolazone days.    Pt is highly anxious of returning home today as he was just restarted on his diuretics. Will plan for discharge tomorrow am, 4/10 with continued observation of his electrolytes.   For questions or updates, please contact [facility] Please consult [email or web address] for contact info under Cardiology/STEMI.  Signed, Graciella Freer, PA-C  02/21/2024, 2:04 PM   I have seen, examined the patient, and reviewed the above assessment and plan.    Interval: No acute overnight events.  Patient reports feeling relatively well this morning.  No new or acute complaints today.  He is somewhat concerned that his potassium dropped to 3.5 mg this morning.  There were no ventricular arrhythmias on his telemetry.  GEN: No acute distress.   Cardiac: Normal rate, regular rhythm. Resp: Normal work of breathing.  Ext: No edema.  Psych: Normal affect   Assessment: Mr. Carles Florea is a 43 year old male with a past medical history notable for systolic heart failure status post ICD who presented with ICD shocks in the setting of profound hypokalemia.  He had a similar admission earlier this year.  #VF #VT #Hypokalemia  Plan:  -Continue with aggressive potassium repletion.  We will need to transition this to a home regimen on discharge. -Continue spironolactone to help with hypokalemia. -Continue antiarrhythmic therapy with mexiletine and amiodarone. -Continue home GDMT regimen.  Nobie Putnam, MD 02/21/2024 6:45 PM

## 2024-02-21 NOTE — Progress Notes (Signed)
   No further VT.   Potassium3.5 (04/09 0225) Magnesium  1.9 (04/09 0225) Creatinine, ser  2.26* (04/09 0225) Keep K > 4.0 and Mg > 2.0   Tentatively plan for home this afternoon after morning K supp and repeat lab ~ 1200.   Close follow up to be arranged.    Casimiro Needle [address]" [place], PA-C  02/21/2024 9:12 AM

## 2024-02-22 ENCOUNTER — Other Ambulatory Visit: Payer: Self-pay | Admitting: Student

## 2024-02-22 ENCOUNTER — Other Ambulatory Visit (HOSPITAL_COMMUNITY): Payer: Self-pay

## 2024-02-22 DIAGNOSIS — Z4502 Encounter for adjustment and management of automatic implantable cardiac defibrillator: Secondary | ICD-10-CM | POA: Diagnosis not present

## 2024-02-22 DIAGNOSIS — I4901 Ventricular fibrillation: Secondary | ICD-10-CM | POA: Diagnosis not present

## 2024-02-22 DIAGNOSIS — I472 Ventricular tachycardia, unspecified: Secondary | ICD-10-CM

## 2024-02-22 LAB — BASIC METABOLIC PANEL WITH GFR
Anion gap: 13 (ref 5–15)
BUN: 44 mg/dL — ABNORMAL HIGH (ref 6–20)
CO2: 21 mmol/L — ABNORMAL LOW (ref 22–32)
Calcium: 8.8 mg/dL — ABNORMAL LOW (ref 8.9–10.3)
Chloride: 100 mmol/L (ref 98–111)
Creatinine, Ser: 2.37 mg/dL — ABNORMAL HIGH (ref 0.61–1.24)
GFR, Estimated: 35 mL/min — ABNORMAL LOW (ref >60)
Glucose, Bld: 98 mg/dL (ref 70–99)
Potassium: 4 mmol/L (ref 3.5–5.1)
Sodium: 134 mmol/L — ABNORMAL LOW (ref 135–145)

## 2024-02-22 MED ORDER — SPIRONOLACTONE 50 MG PO TABS
50.0000 mg | ORAL_TABLET | Freq: Every day | ORAL | 3 refills | Status: DC
Start: 2024-02-22 — End: 2024-03-30
  Filled 2024-02-22: qty 90, 90d supply, fill #0

## 2024-02-22 NOTE — Plan of Care (Signed)

## 2024-02-22 NOTE — TOC Transition Note (Signed)
 Transition of Care [facility]) - Discharge Note   Patient Details  Name: Bradley Powell MRN: [id number] Date of Birth: 03-31-1981  [facility] CM/SW Contact:  Leone Haven, RN Phone Number: 02/22/2024, 11:19 AM   Clinical Narrative:    For dc today, has no needs.      Barriers to Discharge: Continued Medical Work up   Patient Goals and CMS Choice Patient states their goals for this hospitalization and ongoing recovery are:: return home   Choice offered to / list presented to : NA      Discharge Placement                       Discharge Plan and Services Additional resources added to the After Visit Summary for     Discharge Planning Services: CM Consult Post Acute Care Choice: NA          DME Arranged: N/A DME Agency: NA       HH Arranged: NA          Social Drivers of Health (SDOH) Interventions SDOH Screenings   Food Insecurity: No Food Insecurity (02/18/2024)  Housing: Low Risk  (02/18/2024)  Transportation Needs: No Transportation Needs (02/18/2024)  Utilities: Not At Risk (02/18/2024)  Social Connections: Socially Isolated (11/22/2023)  Tobacco Use: Low Risk  (02/17/2024)     Readmission Risk Interventions    02/20/2024   12:41 PM 11/30/2023    2:37 PM  Readmission Risk Prevention Plan  Transportation Screening Complete Complete  PCP or Specialist Appt within 5-7 Days  Complete  PCP or Specialist Appt within 3-5 Days Complete   Home Care Screening  Complete  Medication Review (RN CM)  Complete  HRI or Home Care Consult Complete   Palliative Care Screening Not Applicable   Medication Review (RN Care Manager) Complete

## 2024-02-22 NOTE — Plan of Care (Signed)
  Problem: Education: Goal: Knowledge of General Education information will improve Description: Including pain rating scale, medication(s)/side effects and non-pharmacologic comfort measures 02/22/2024 1117 by Martie Round, RN Outcome: Adequate for Discharge 02/22/2024 1021 by Martie Round, RN Outcome: Progressing   Problem: Health Behavior/Discharge Planning: Goal: Ability to manage health-related needs will improve 02/22/2024 1117 by Martie Round, RN Outcome: Adequate for Discharge 02/22/2024 1021 by Martie Round, RN Outcome: Progressing   Problem: Clinical Measurements: Goal: Ability to maintain clinical measurements within normal limits will improve 02/22/2024 1117 by Martie Round, RN Outcome: Adequate for Discharge 02/22/2024 1021 by Martie Round, RN Outcome: Progressing Goal: Will remain free from infection 02/22/2024 1117 by Martie Round, RN Outcome: Adequate for Discharge 02/22/2024 1021 by Martie Round, RN Outcome: Progressing Goal: Diagnostic test results will improve 02/22/2024 1117 by Martie Round, RN Outcome: Adequate for Discharge 02/22/2024 1021 by Martie Round, RN Outcome: Progressing Goal: Respiratory complications will improve 02/22/2024 1117 by Martie Round, RN Outcome: Adequate for Discharge 02/22/2024 1021 by Martie Round, RN Outcome: Progressing Goal: Cardiovascular complication will be avoided 02/22/2024 1117 by Martie Round, RN Outcome: Adequate for Discharge 02/22/2024 1021 by Martie Round, RN Outcome: Progressing   Problem: Activity: Goal: Risk for activity intolerance will decrease 02/22/2024 1117 by Martie Round, RN Outcome: Adequate for Discharge 02/22/2024 1021 by Martie Round, RN Outcome: Progressing   Problem: Nutrition: Goal: Adequate nutrition will be maintained 02/22/2024 1117 by Martie Round, RN Outcome: Adequate for Discharge 02/22/2024 1021 by Martie Round, RN Outcome:  Progressing   Problem: Coping: Goal: Level of anxiety will decrease 02/22/2024 1117 by Martie Round, RN Outcome: Adequate for Discharge 02/22/2024 1021 by Martie Round, RN Outcome: Progressing   Problem: Elimination: Goal: Will not experience complications related to bowel motility 02/22/2024 1117 by Martie Round, RN Outcome: Adequate for Discharge 02/22/2024 1021 by Martie Round, RN Outcome: Progressing Goal: Will not experience complications related to urinary retention 02/22/2024 1117 by Martie Round, RN Outcome: Adequate for Discharge 02/22/2024 1021 by Martie Round, RN Outcome: Progressing   Problem: Pain Managment: Goal: General experience of comfort will improve and/or be controlled 02/22/2024 1117 by Martie Round, RN Outcome: Adequate for Discharge 02/22/2024 1021 by Martie Round, RN Outcome: Progressing   Problem: Safety: Goal: Ability to remain free from injury will improve 02/22/2024 1117 by Martie Round, RN Outcome: Adequate for Discharge 02/22/2024 1021 by Martie Round, RN Outcome: Progressing   Problem: Skin Integrity: Goal: Risk for impaired skin integrity will decrease 02/22/2024 1117 by Martie Round, RN Outcome: Adequate for Discharge 02/22/2024 1021 by Martie Round, RN Outcome: Progressing

## 2024-02-22 NOTE — Discharge Summary (Addendum)
 ELECTROPHYSIOLOGY DISCHARGE SUMMARY    Patient ID: Bradley Powell,  MRN: [id number], DOB/AGE: Jan 18, 1981 43 y.o.  Admit date: 02/17/2024 Discharge date: 02/22/2024  Primary Care Physician: [name], MD  Primary Cardiologist: None  Electrophysiologist: Dr. Ladona Ridgel   Primary Discharge Diagnosis:  VT storm Hypokalemia  Secondary Discharge Diagnosis:  CKD III Medical Non-compliance Chronic systolic CHF  Brief HPI: Bradley Powell is a 43 y.o. male with a history of VT, chronic combined CHF, AFL, CKD III, and hypokalemia admitted for syncope and VT storm in the setting of med non-compliance.    Hospital Course:  The patient was admitted for syncope at home in the setting of VT with appropriate therapy by his device. K 2.4 on arrival and pt admitted to non-compliance. States he was taking potassium as directed, but had ran out of spironolactone for an undisclosed time-frame.  They were monitored on telemetry which demonstrated no further VT. His K was aggressively supplemented while holding diuretics for several days, then diuretics were resumed. K remained stable on previously prescribed regimen.   The patient was examined and considered to be stable for discharge.   Reviewed [place] driving restrictions   The patient will be seen back by HF clinic and EP APP in close follow up as in his AVS. Labs have been ordered and asked for pt to arrive 4/14 for recheck at our [facility].   Physical Exam: Vitals:   02/22/24 0016 02/22/24 0312 02/22/24 0315 02/22/24 0750  BP: (!) 122/101 107/77  110/73  Pulse: 74   90  Resp: 18 20  18   Temp: 97.7 F (36.5 C) 97.6 F (36.4 C)  (!) 97.4 F (36.3 C)  TempSrc: Oral Oral  Oral  SpO2:  99%  100%  Weight:   115.6 kg   Height:        GEN- NAD. A&O x 3.  HEENT: Normocephalic, atraumatic Lungs- CTAB, Normal effort.  Heart- RRR, No M/G/R.  GI- Soft, NT, ND.  Extremities- No clubbing, cyanosis, or edema; Groin without  hematoma   Discharge Medications:  Allergies as of 02/22/2024       Reactions   Entresto [sacubitril-valsartan] Nausea And Vomiting, Other (See Comments)   Lightheaded, fainting        Medication List     TAKE these medications    acetaminophen 500 MG tablet Commonly known as: TYLENOL Take 1,000 mg by mouth as needed for mild pain (pain score 1-3) (Leg pain).   allopurinol 100 MG tablet Commonly known as: ZYLOPRIM Take 1 tablet (100 mg total) by mouth daily. Please contact PCP for further refills   amiodarone 200 MG tablet Commonly known as: PACERONE Take 200 mg by mouth 2 (two) times daily.   atorvastatin 40 MG tablet Commonly known as: LIPITOR Take 1 tablet (40 mg total) by mouth daily.   Eliquis 5 MG Tabs tablet Generic drug: apixaban TAKE 1 TABLET BY MOUTH TWICE A DAY   empagliflozin 10 MG Tabs tablet Commonly known as: Jardiance Take 1 tablet (10 mg total) by mouth daily.   esomeprazole 40 MG capsule Commonly known as: NEXIUM Take 40 mg by mouth 2 (two) times daily.   levothyroxine 50 MCG tablet Commonly known as: SYNTHROID TAKE 1 TABLET (50 MCG TOTAL) BY MOUTH DAILY BEFORE BREAKFAST. TAKE 1 HOUR BEFORE BREAKFAST   metolazone 5 MG tablet Commonly known as: ZAROXOLYN Take 1 tablet (5 mg total) by mouth 2 (two) times a week. Every Sunday and Wednesday What  changed: Another medication with the same name was removed. Continue taking this medication, and follow the directions you see here.   mexiletine 150 MG capsule Commonly known as: MEXITIL Take 1 capsule (150 mg total) by mouth every 12 (twelve) hours.   Mucinex Maximum Strength 1200 MG Tb12 Generic drug: Guaifenesin Take 1 tablet by mouth 2 (two) times daily.   polyethylene glycol powder 17 GM/SCOOP powder Commonly known as: GLYCOLAX/MIRALAX Take 1 CAPFUL (17 g) by mouth daily as needed.   potassium chloride SA 20 MEQ tablet Commonly known as: KLOR-CON M Take 2 tablets (40 mEq total) by mouth 3  (three) times daily. Take extra 2 tabs when you take metolazone   sodium chloride 0.65 % nasal spray Commonly known as: OCEAN Place 2 sprays into the nose as needed for congestion.   spironolactone 50 MG tablet Commonly known as: ALDACTONE Take 1 tablet (50 mg total) by mouth daily.   torsemide 20 MG tablet Commonly known as: DEMADEX Take 4 tablets (80mg )  in the morning, and 3 tablets (60mg ) in the afternoon   Voltaren 1 % Gel Generic drug: diclofenac Sodium Apply 2 g topically daily as needed (Bilateral knee pain).        Disposition: Home with usual follow up as in AVS  Signed, Graciella Freer, PA-C  02/22/2024 10:49 AM  I have seen, examined the patient, and reviewed the above assessment and plan.    Hospital course: Patient presented with ICD shocks due to VT/VF in the setting of profound hypokalemia.  Patient underwent aggressive potassium repletion and optimization of his potassium regimen.  He was also restarted on spironolactone.  Potassium remained stable and he had no recurrence of ventricular arrhythmias.  Today, he reported feeling relatively well with no new or acute complaints.  He was discharged on a regimen of 40 mEq 3 times daily with an additional 20 mEq on days that he takes metolazone, as well as spironolactone 50 mg once daily, and close follow-up.  GEN: No acute distress.   Cardiac: Normal rate, regular rhythm. Resp: Normal work of breathing.  Ext: No edema.  Psych: Normal affect   Duration of Discharge Encounter: 60 minutes MD time: 30 minutes  Nobie Putnam, MD 02/22/2024 10:55 PM

## 2024-02-29 DIAGNOSIS — I5040 Unspecified combined systolic (congestive) and diastolic (congestive) heart failure: Secondary | ICD-10-CM | POA: Diagnosis not present

## 2024-02-29 DIAGNOSIS — R55 Syncope and collapse: Secondary | ICD-10-CM | POA: Diagnosis not present

## 2024-02-29 DIAGNOSIS — M546 Pain in thoracic spine: Secondary | ICD-10-CM | POA: Diagnosis not present

## 2024-02-29 DIAGNOSIS — E876 Hypokalemia: Secondary | ICD-10-CM | POA: Diagnosis not present

## 2024-03-01 ENCOUNTER — Other Ambulatory Visit: Payer: Self-pay

## 2024-03-01 MED ORDER — AMIODARONE HCL 200 MG PO TABS
200.0000 mg | ORAL_TABLET | Freq: Every day | ORAL | 3 refills | Status: DC
Start: 2024-01-28 — End: 2024-03-29
  Filled 2024-03-12: qty 90, 90d supply, fill #0

## 2024-03-01 MED ORDER — ALLOPURINOL 100 MG PO TABS
200.0000 mg | ORAL_TABLET | Freq: Every day | ORAL | 1 refills | Status: DC
  Filled 2024-03-01: qty 60, 30d supply, fill #0

## 2024-03-01 MED ORDER — ESOMEPRAZOLE MAGNESIUM 40 MG PO CPDR
40.0000 mg | DELAYED_RELEASE_CAPSULE | Freq: Two times a day (BID) | ORAL | 3 refills | Status: DC
  Filled 2024-03-01: qty 60, 30d supply, fill #0
  Filled 2024-04-21: qty 60, 30d supply, fill #1
  Filled 2024-05-23: qty 60, 30d supply, fill #2
  Filled 2024-06-18: qty 60, 30d supply, fill #3

## 2024-03-01 MED FILL — Apixaban Tab 5 MG: ORAL | 15 days supply | Qty: 30 | Fill #0 | Status: CN

## 2024-03-04 ENCOUNTER — Other Ambulatory Visit: Payer: Self-pay

## 2024-03-04 ENCOUNTER — Telehealth (HOSPITAL_COMMUNITY): Payer: Self-pay

## 2024-03-04 ENCOUNTER — Other Ambulatory Visit (HOSPITAL_COMMUNITY): Payer: Self-pay

## 2024-03-04 MED ORDER — LEVOTHYROXINE SODIUM 50 MCG PO TABS
50.0000 ug | ORAL_TABLET | Freq: Every day | ORAL | 3 refills | Status: DC
  Filled 2024-03-04: qty 90, 90d supply, fill #0

## 2024-03-04 MED ORDER — METOLAZONE 5 MG PO TABS
5.0000 mg | ORAL_TABLET | ORAL | 1 refills | Status: DC
  Filled 2024-03-04 (×2): qty 10, 35d supply, fill #0

## 2024-03-04 MED ORDER — EMPAGLIFLOZIN 10 MG PO TABS: 10.0000 mg | ORAL_TABLET | Freq: Every day | ORAL | 11 refills | Status: DC

## 2024-03-04 MED ORDER — METOLAZONE 5 MG PO TABS
5.0000 mg | ORAL_TABLET | ORAL | 0 refills | Status: DC
  Filled 2024-03-04: qty 3, 21d supply, fill #0

## 2024-03-04 MED ORDER — SPIRONOLACTONE 25 MG PO TABS
25.0000 mg | ORAL_TABLET | Freq: Every day | ORAL | 3 refills | Status: DC
  Filled 2024-03-04 (×2): qty 90, 90d supply, fill #0

## 2024-03-04 MED ORDER — ALLOPURINOL 100 MG PO TABS
100.0000 mg | ORAL_TABLET | Freq: Every day | ORAL | 1 refills | Status: DC
  Filled 2024-03-04: qty 60, 60d supply, fill #0

## 2024-03-04 MED ORDER — POTASSIUM CHLORIDE ER 20 MEQ PO TBCR
40.0000 meq | EXTENDED_RELEASE_TABLET | Freq: Two times a day (BID) | ORAL | 0 refills | Status: DC
  Filled 2024-03-04: qty 60, 15d supply, fill #0

## 2024-03-04 MED ORDER — METOLAZONE 2.5 MG PO TABS
ORAL_TABLET | ORAL | 3 refills | Status: DC
  Filled 2024-03-04: qty 4, 4d supply, fill #0

## 2024-03-04 MED ORDER — EMPAGLIFLOZIN 10 MG PO TABS
10.0000 mg | ORAL_TABLET | Freq: Every day | ORAL | 3 refills | Status: DC
  Filled 2024-03-04: qty 30, 30d supply, fill #0

## 2024-03-04 MED ORDER — POTASSIUM CHLORIDE ER 20 MEQ PO TBCR: EXTENDED_RELEASE_TABLET | ORAL | 1 refills | Status: DC

## 2024-03-04 MED FILL — Apixaban Tab 5 MG: ORAL | 30 days supply | Qty: 60 | Fill #0 | Status: AC

## 2024-03-04 NOTE — Telephone Encounter (Signed)
 Advanced Heart Failure Patient Advocate Encounter  Patient reached out to office regarding insurance coverage. [name] has been terminated, patient still has active Medicaid coverage, though Medicaid is rejecting pharmacy claims with 'submit to primary payer' rejections.  Contacted [employer] to have [employer] review active coverage. Representative stated that this process may take up to 30 days, patient is welcome to call and request urgent status, but as an advocate I am not able to request this update be marked as urgent.  Representative stated that pharmacy should be able to use [facility] code 01 for claims to be approved until [employer] is able to review the case.  Kennis Peacock, CPhT Rx Patient Advocate Phone: [phone]

## 2024-03-04 NOTE — Telephone Encounter (Signed)
 Advanced Heart Failure Patient Advocate Encounter  Prior authorization for Jardiance  has been submitted and approved. Test billing returns $4 for 90 day supply.  Bradley Powell Effective: 02/28/2024 to 03/04/2025  Kennis Peacock, CPhT Rx Patient Advocate Phone: [phone]

## 2024-03-05 ENCOUNTER — Other Ambulatory Visit: Payer: Self-pay

## 2024-03-06 ENCOUNTER — Telehealth (HOSPITAL_COMMUNITY): Payer: Self-pay

## 2024-03-06 NOTE — Telephone Encounter (Signed)
 Called to confirm/remind patient of their appointment at the [facility] Failure Clinic on 03/07/2024 3:00.   Appointment:   [] Confirmed  [x] Left mess   [] No answer/No voice mail  [] VM Full/unable to leave message  [] Phone not in service  Patient reminded to bring all medications and/or complete list.  Confirmed patient has transportation. Gave directions, instructed to utilize valet parking.

## 2024-03-06 NOTE — Progress Notes (Signed)
 ADVANCED HF CLINIC NOTE   PCP:  Merl Star, MD  GI : Dr Lucille Saas.  HF Cardiologist: Dr. Julane Ny  Reason for Visit: Heart Failure Follow-up HPI: Bradley Powell is a 43 y.o. male with a history of combined systolic/diastolic heart failure, obesity, HTN, and gout.   Admitted 4/18 with marked volume overload. EF 40-45%. Diuresed with IV lasix  tid + metolazone . Had RHC/LHC as noted below. Overall diuresed 45 pounds. Discharge weight was 281 pounds.    AFL ablation on 5/19. Subsequently had marked 1st AV block with progression to junctional rhythm. Saw Dr. Carolynne Citron who wanted to follow it. Suggested cutting back carvedilol .  Admitted  8/21 with intractable N/V of unclear etiology, hypotension and AKI. Creatinine peaked at 4.5. Back to 1.6 at d/c. Has been followed by GI.   cMRI 7/21 LVEF 45% suspect infiltrative CM. ECV 55%  Volume overloaded at clinic f/u 6/22, weight up 50 lbs. Misunderstood metolazone  directions and took for 5 days. Weight down 65 lbs and required IVF in clinic.  Admitted 4/23 with VT. Had massive volume overload on exam. Had recurrent sustained VT w/ rates 200-210, converted with IV amio and moved to ICU. Echo EF 35-40% RV modHK. Diuresed over 50 pounds with IV lasix . Underwent Bos[employer]i ICD implant. IV amio switched to po (already on mexilitene for PVCs). 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   In 9/23, weight up 20 lbs. ReDs 44%. Prescribed Furoscix  80 mg daily + 40 extra KCL daily x 3 days. Instructed to restart torsemide  40 mg thereafter. Remained volume up at follow up 10/23. Torsemide  increased to 40 bid and added metolazone  2.5 daily x 3 days.  Admitted for 3 weeks 11/24-12/24 diuresed 70 pounds on high-dose lasix   Readmitted 1/25 with VT storm in setting of hypokalemia and recurrent volume overload. Loaded amio. D/c weight 236 pounds.   Seen in clinic earlier last month and was volume up. HL score was elevated at 18L. Wt was up to 151 lb on  our scale. He was instructed to increase metolazone  to 2 x/ wk on Sundays and Mondays.  02/06/24: presents back today for f/u. Wt down 3 lb at 148 lb but he says wt at home is lower, ~140-143 lb. He reports robust UOP on metolazone  days. HL score has improved and back to normal at 2. No further VT/ NSVT episodes on device interrogation.  Reports NYHA Class II symptoms. BP well controlled 110/64.   Admitted 4/5-4/10 for syncope in the setting of hypokalemia and VT treated w/ ICD therapy. This was after running out of spironolactone . His K was aggressively supplemented while holding diuretics for several days, then diuretics were resumed. K remained stable on previously prescribed regimen   He returns today for heart failure follow up. Overall feeling fine. NYHA II. Reports his wt was up when he left the hospital but trending back down, though still w/ mild leg edema. Compliant w/ diuretics and reports good UOP. Compliant w/ spironolactone  and KCl. HL score 6 on device interrogation. It appears he had 1 V therapy delivered on 4/21 but he was unaware of this. He never fills his shocks.   BP 90/50. Denies orthostatic symptoms. EKG shows SR 74 bpm long QTc 561 ms.      Cardiac Studies - Echo (4/23): EF 35-40%, moderate LV dysfunction, RV moderately reduced, mild to moderate MR, moderate TR, posterior pericardial effusion  - cMRI (7/21): 1. Findings suggestive of infiltrative cardiomyopathy. Native T1 ECV 55%, with diffuse post  contrast delayed myocardial enhancement and abnormal T1 myocardial nulling kinetics, consistent with a diagnosis of cardiac amyloidosis. 2.  Upper limit of normal biventricular chamber size. 3. Mildly reduced biventricular systolic function. LVEF 45%, RVEF 44%. 4. Cannot exclude possible anomalous pulmonary venous drainage in to the mid SVC. Seen on axial HASTE image series 3 image 4, but evaluation limited due to poor spatial resolution. Consider ECG gated CTA chest for  further evaluation.  - Echo (2/21): EF 40-45%  - Echo (1/20): EF 35-40% - Echo (3/19): EF 45-50%. - Echo (9/18): EF ~25-30%.  - Holter (7/18): 6% PVCs - Sleep study (10/18): AHI 26/hr  - RHC (2/21): RA = 14 RV = 43/15 PA = 43/21 (31) PCW = 15 Fick cardiac output/index = 5.7/2.4 PVR = 2.8 WU Ao sat = 97% PA sat = 73%, 75% SVC sat = 67%   - R/LHC (4/18): AO = 109/86 (97) LV =  101/28 RA =  29 RV = 50/22 PA = 55/24 (38) PCW = 26 Fick cardiac output/index = 6.4/2.6 Thermal CO/CI = 4.9/2.0 PVR = 2.5 WU FA sat = 98% PA sat = 69%, 71% SVC sat = 82%, 82% IVC sat = 66%   1. Normal coronary arteries  2. Mild to moderate pulmonary HTN with normal PVR 3. Biventricular volume overload with R > L heart failure 4. Moderately decreased CO by thermodilution  5. 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 No intra-cardiac shunt  Past Medical History:  Diagnosis Date   Atrial flutter (HCC)    a. s/p ablation 03/2018.   Chronic combined systolic and diastolic CHF (congestive heart failure) (HCC)    History of esophagogastroduodenoscopy (EGD)    Morbid obesity (HCC)    NICM (nonischemic cardiomyopathy) (HCC)    OSA on CPAP    mild OSA with an AHI of 7.7/hr on auto CPAP   PVC's (premature ventricular contractions)    Past Surgical History:  Procedure Laterality Date   A-FLUTTER ABLATION N/A 04/06/2018   Procedure: A-FLUTTER ABLATION;  Surgeon: Tammie Fall, MD;  Location: [facility] INVASIVE CV LAB;  Service: Cardiovascular;  Laterality: N/A;   BIOPSY  07/11/2023   Procedure: BIOPSY;  Surgeon: Felecia Hopper, MD;  Location: [facility] ENDOSCOPY;  Service: Gastroenterology;;   ESOPHAGEAL BRUSHING  06/19/2020   Procedure: ESOPHAGEAL BRUSHING;  Surgeon: Baldo Bonds, MD;  Location: [facility] ENDOSCOPY;  Service: Endoscopy;;   ESOPHAGOGASTRODUODENOSCOPY (EGD) WITH PROPOFOL  Left 06/19/2020   Procedure:  ESOPHAGOGASTRODUODENOSCOPY (EGD) WITH PROPOFOL ;  Surgeon: Baldo Bonds, MD;  Location: [facility] ENDOSCOPY;  Service: Endoscopy;  Laterality: Left;   ESOPHAGOGASTRODUODENOSCOPY (EGD) WITH PROPOFOL  N/A 07/11/2023   Procedure: ESOPHAGOGASTRODUODENOSCOPY (EGD) WITH PROPOFOL ;  Surgeon: Felecia Hopper, MD;  Location: [facility] ENDOSCOPY;  Service: Gastroenterology;  Laterality: N/A;   ICD IMPLANT N/A 02/22/2022   Procedure: ICD IMPLANT;  Surgeon: Tammie Fall, MD;  Location: [facility] INVASIVE CV LAB;  Service: Cardiovascular;  Laterality: N/A;   RIGHT HEART CATH N/A 12/20/2019   Procedure: RIGHT HEART CATH;  Surgeon: Mardell Shade, MD;  Location: [facility] INVASIVE CV LAB;  Service: Cardiovascular;  Laterality: N/A;   RIGHT/LEFT HEART CATH AND CORONARY ANGIOGRAPHY N/A 03/01/2017   Procedure: Right/Left Heart Cath and Coronary Angiography;  Surgeon: Mardell Shade, MD;  Location: [facility] INVASIVE CV LAB;  Service: Cardiovascular;  Laterality: N/A;   Current Outpatient Medications  Medication Sig Dispense Refill   acetaminophen  (TYLENOL ) 500 MG tablet Take 1,000 mg  by mouth as needed for mild pain (pain score 1-3) (Leg pain).     allopurinol  (ZYLOPRIM ) 100 MG tablet Take 1 tablet (100 mg total) by mouth daily. Please contact PCP for further refills     amiodarone  (PACERONE ) 200 MG tablet Take 1 tablet (200 mg total) by mouth daily. 90 tablet 3   apixaban  (ELIQUIS ) 5 MG TABS tablet TAKE 1 TABLET BY MOUTH TWICE A DAY 60 tablet 11   diclofenac  Sodium (VOLTAREN ) 1 % GEL Apply 2 g topically daily as needed (Bilateral knee pain).     empagliflozin  (JARDIANCE ) 10 MG TABS tablet Take 1 tablet (10 mg total) by mouth daily. 30 tablet 11   esomeprazole  (NEXIUM ) 40 MG capsule Take 1 capsule (40 mg total) by mouth 2 (two) times daily. 180 capsule 3   levothyroxine  (SYNTHROID ) 50 MCG tablet TAKE 1 TABLET (50 MCG TOTAL) BY MOUTH DAILY BEFORE BREAKFAST. TAKE 1 HOUR BEFORE BREAKFAST 90 tablet 3   metolazone  (ZAROXOLYN ) 5 MG tablet  Take 1 tablet (5 mg total) by mouth 2 (two) times a week every Sunday and Wednesday. 10 tablet 1   mexiletine (MEXITIL ) 150 MG capsule Take 1 capsule (150 mg total) by mouth every 12 (twelve) hours. 60 capsule 5   polyethylene glycol powder (GLYCOLAX /MIRALAX ) 17 GM/SCOOP powder Take 1 CAPFUL (17 g) by mouth daily as needed. 238 g 1   potassium chloride  SA (KLOR-CON  M) 20 MEQ tablet Take 60 mEq by mouth 2 (two) times daily. And 2 tablets at night     sodium chloride  (OCEAN) 0.65 % nasal spray Place 2 sprays into the nose as needed for congestion.     spironolactone  (ALDACTONE ) 50 MG tablet Take 1 tablet (50 mg total) by mouth daily. 90 tablet 3   torsemide  (DEMADEX ) 20 MG tablet Take 4 tablets (80mg )  in the morning, and 3 tablets (60mg ) in the afternoon 180 tablet 2   atorvastatin  (LIPITOR) 40 MG tablet Take 1 tablet (40 mg total) by mouth daily. 30 tablet 6   Guaifenesin (MUCINEX MAXIMUM STRENGTH) 1200 MG TB12 Take 1 tablet by mouth 2 (two) times daily. (Patient not taking: Reported on 03/07/2024)     No current facility-administered medications for this encounter.   Allergies:   Entresto  [sacubitril -valsartan ]   Social History:  The patient  reports that he has never smoked. He has never used smokeless tobacco. He reports current alcohol use. He reports that he does not use drugs.   Family History:  The patient's family history includes CVA in his mother; Gout in his father; Hypertension in his father; Multiple sclerosis in his mother; Seizures in his mother.   ROS:  Please see the history of present illness.   All other systems are personally reviewed and negative.   Wt Readings from Last 3 Encounters:  03/07/24 111.6 kg (246 lb)  02/22/24 115.6 kg (254 lb 13.6 oz)  01/16/24 112.8 kg (248 lb 9.6 oz)    BP (!) 90/50   Pulse 68   Wt 111.6 kg (246 lb)   SpO2 99%   BMI 37.40 kg/m   PHYSICAL EXAM: General:  Well appearing, obese. No respiratory difficulty HEENT: normal Neck: supple.  JVD 8 cm. Carotids 2+ bilat; no bruits. No lymphadenopathy or thyromegaly appreciated. Cor: PMI nondisplaced. Regular rate & rhythm. No rubs, gallops or murmurs. Lungs: clear Abdomen: soft, nontender, nondistended. No hepatosplenomegaly. No bruits or masses. Good bowel sounds. Extremities: no cyanosis, clubbing, rash, edema Neuro: alert & oriented x 3, cranial nerves  grossly intact. moves all 4 extremities w/o difficulty. Affect pleasant.   [employer] Device interrogation (personally reviewed): HL score 2. No recent VT since 11/20/23.  Activity level 2.4 hr/day. Personally reviewed   ASSESSMENT AND PLAN:  1. Chronic HFrEF - Echo (1/20):  EF 35-40% with mild RV dysfunction RVSP 40 mmHG - Echo (2/21): EF 40-45% Moderate RV dysfunction  - PYP (4/22). Ratio 1.26 read as equivocal.  SPEP negative - cMRI repeated (11/22): LVEF 43%, RVEF 44%, LGE concerning for sarcoid.  - cMRI (8/21): EF 45% + infiltrative process suggestive of possible amyloidosis - Echo (4/23): EF 35-40% Moderate RV dysfunction - Echo 12/24 EF 35-40% - Has seen geneticist, Dr. Drexel Gentles. Work up c/w LMNA.  - NYHA II (stable). Mild volume overload on exam. HL Score 6.  - Continue torsemide  80/60 + KCL 40 mEq bid + metolazone  2.5 every Sunday and Wednesdays w/ extra 40 mEq of KCL on those days - Continue spironolactone  50 mg daily.  - Continue Jardiance  10 mg daily.  - Off b-blocker with bradycardia/heart block - No ARNi and ARB (hypotension and N/V in the past). - No Bidil  (hypotension in the past) - not candidate for Advanced therapies given CKD and size - Check BMP and Mg level today  - encouraged to wear LE compression stockings   2. VT - 04/23 VT - s/p ICD 4/23. - VT storm 1/25 in setting of hypokalemia - Admitted again 02/17/24 with VT s/p device shock in the setting of hypokalemia (he as out of spirolactone) - per device interrogation, he had 1 V therapy on 4/21 but asymptomatic. EKG today shows prolonged QT. He reports  compliance w/ KCl and Spiro  - Check K and Mg level today  - Continue mexiletine 150 mg bid   - Continue amiodarone  200 bid.  - Followed by Dr. Carolynne Citron. Has upcomming f/u w/ EP   3. CKD Stage IIIb - Baseline SCr 2.0-2.3 - Check BMP today   4. Chronic AF/AFL - S/p AFL ablation 5/19 with Dr. Carolynne Citron. - Continue Amio per above  - Continue Eliquis  5 mg bid.    5.  OSA - currently untreated, waiting for new device   6. Obesity  - Body mass index is 37.4 kg/m. - Eventual GLP1Ri   7. Hypothyroidism - likely amio-induced - continue levothyroxine  - Euthyroid on last labs.   He has had multiple readmits over the last several months for a/c CHF and VT. Will bring back in 4 wk to reassess volume status, compliance w/ meds and labs.   Ruddy Corral, PA-C  4:27 PM

## 2024-03-07 ENCOUNTER — Encounter (HOSPITAL_COMMUNITY)

## 2024-03-07 ENCOUNTER — Encounter (HOSPITAL_COMMUNITY): Payer: Self-pay

## 2024-03-07 ENCOUNTER — Other Ambulatory Visit: Payer: Self-pay

## 2024-03-07 ENCOUNTER — Ambulatory Visit (HOSPITAL_COMMUNITY)
Admission: RE | Admit: 2024-03-07 | Discharge: 2024-03-07 | Disposition: A | Discharge status: Home / Self Care | Source: Ambulatory Visit | Attending: Cardiology | Admitting: Cardiology

## 2024-03-07 VITALS — BP 90/50 | HR 68 | Wt 246.0 lb

## 2024-03-07 DIAGNOSIS — Z79890 Hormone replacement therapy (postmenopausal): Secondary | ICD-10-CM | POA: Diagnosis not present

## 2024-03-07 DIAGNOSIS — Z6837 Body mass index (BMI) 37.0-37.9, adult: Secondary | ICD-10-CM | POA: Insufficient documentation

## 2024-03-07 DIAGNOSIS — I5022 Chronic systolic (congestive) heart failure: Secondary | ICD-10-CM | POA: Insufficient documentation

## 2024-03-07 DIAGNOSIS — I130 Hypertensive heart and chronic kidney disease with heart failure and stage 1 through stage 4 chronic kidney disease, or unspecified chronic kidney disease: Secondary | ICD-10-CM | POA: Insufficient documentation

## 2024-03-07 DIAGNOSIS — Z95810 Presence of automatic (implantable) cardiac defibrillator: Secondary | ICD-10-CM | POA: Insufficient documentation

## 2024-03-07 DIAGNOSIS — I472 Ventricular tachycardia, unspecified: Secondary | ICD-10-CM | POA: Insufficient documentation

## 2024-03-07 DIAGNOSIS — E039 Hypothyroidism, unspecified: Secondary | ICD-10-CM | POA: Insufficient documentation

## 2024-03-07 DIAGNOSIS — Z7901 Long term (current) use of anticoagulants: Secondary | ICD-10-CM | POA: Insufficient documentation

## 2024-03-07 DIAGNOSIS — Z79899 Other long term (current) drug therapy: Secondary | ICD-10-CM | POA: Insufficient documentation

## 2024-03-07 DIAGNOSIS — G4733 Obstructive sleep apnea (adult) (pediatric): Secondary | ICD-10-CM | POA: Insufficient documentation

## 2024-03-07 DIAGNOSIS — I5042 Chronic combined systolic (congestive) and diastolic (congestive) heart failure: Secondary | ICD-10-CM

## 2024-03-07 DIAGNOSIS — Z7984 Long term (current) use of oral hypoglycemic drugs: Secondary | ICD-10-CM | POA: Diagnosis not present

## 2024-03-07 DIAGNOSIS — I482 Chronic atrial fibrillation, unspecified: Secondary | ICD-10-CM | POA: Diagnosis not present

## 2024-03-07 DIAGNOSIS — N1832 Chronic kidney disease, stage 3b: Secondary | ICD-10-CM | POA: Diagnosis not present

## 2024-03-07 LAB — BASIC METABOLIC PANEL WITH GFR
Anion gap: 15 (ref 5–15)
BUN: 55 mg/dL — ABNORMAL HIGH (ref 6–20)
CO2: 25 mmol/L (ref 22–32)
Calcium: 9 mg/dL (ref 8.9–10.3)
Chloride: 93 mmol/L — ABNORMAL LOW (ref 98–111)
Creatinine, Ser: 2.51 mg/dL — ABNORMAL HIGH (ref 0.61–1.24)
GFR, Estimated: 32 mL/min — ABNORMAL LOW (ref >60)
Glucose, Bld: 89 mg/dL (ref 70–99)
Potassium: 2.9 mmol/L — ABNORMAL LOW (ref 3.5–5.1)
Sodium: 133 mmol/L — ABNORMAL LOW (ref 135–145)

## 2024-03-07 LAB — MAGNESIUM: Magnesium: 2.3 mg/dL (ref 1.7–2.4)

## 2024-03-07 MED ORDER — ATORVASTATIN CALCIUM 40 MG PO TABS
40.0000 mg | ORAL_TABLET | Freq: Every day | ORAL | 6 refills | Status: DC
  Filled 2024-03-07: qty 30, 30d supply, fill #0

## 2024-03-07 NOTE — Patient Instructions (Signed)
 Medication Changes:  None, continue current medications  Lab Work:  Labs done today, your results will be available in MyChart, we will contact you for abnormal readings.  Special Instructions // Education:  Do the following things EVERYDAY: Weigh yourself in the morning before breakfast. Write it down and keep it in a log. Take your medicines as prescribed Eat low salt foods--Limit salt (sodium) to 2000 mg per day.  Stay as active as you can everyday Limit all fluids for the day to less than 2 liters   Follow-Up in: 1 month   At the [facility], you and your health needs are our priority. We have a designated team specialized in the treatment of Heart Failure. This Care Team includes your primary Heart Failure Specialized Cardiologist (physician), Advanced Practice Providers (APPs- Physician Assistants and Nurse Practitioners), and Pharmacist who all work together to provide you with the care you need, when you need it.   You may see any of the following providers on your designated Care Team at your next follow up:  Dr. Jules Oar Dr. Peder Bourdon Dr. Alwin Baars Dr. Judyth Nunnery Nieves Bars, NP Ruddy Corral, [place] [facility], [place] Dennise Fitz, NP [place] Lee, NP Luster Salters, PharmD   Please be sure to bring in all your medications bottles to every appointment.   Need to Contact Us :  If you have any questions or concerns before your next appointment please send us  a message through [facility] or call our office at [phone].    TO LEAVE A MESSAGE FOR THE NURSE SELECT OPTION 2, PLEASE LEAVE A MESSAGE INCLUDING: YOUR NAME DATE OF BIRTH CALL BACK NUMBER REASON FOR CALL**this is important as we prioritize the call backs  YOU WILL RECEIVE A CALL BACK THE SAME DAY AS LONG AS YOU CALL BEFORE 4:00 PM

## 2024-03-11 ENCOUNTER — Encounter (HOSPITAL_COMMUNITY): Payer: Self-pay | Admitting: Internal Medicine

## 2024-03-11 ENCOUNTER — Telehealth (HOSPITAL_COMMUNITY): Payer: Self-pay

## 2024-03-11 ENCOUNTER — Other Ambulatory Visit (HOSPITAL_COMMUNITY): Payer: Self-pay

## 2024-03-11 ENCOUNTER — Other Ambulatory Visit: Payer: Self-pay

## 2024-03-11 DIAGNOSIS — I5042 Chronic combined systolic (congestive) and diastolic (congestive) heart failure: Secondary | ICD-10-CM

## 2024-03-11 MED ORDER — POTASSIUM CHLORIDE CRYS ER 20 MEQ PO TBCR: 80.0000 meq | EXTENDED_RELEASE_TABLET | Freq: Two times a day (BID) | ORAL | 3 refills | Status: DC

## 2024-03-11 MED ORDER — POTASSIUM CHLORIDE CRYS ER 20 MEQ PO TBCR
80.0000 meq | EXTENDED_RELEASE_TABLET | Freq: Two times a day (BID) | ORAL | 3 refills | Status: DC
  Filled 2024-03-11: qty 180, 18d supply, fill #0

## 2024-03-11 NOTE — Telephone Encounter (Addendum)
 Pt aware, agreeable, and verbalized understanding Labs ordered and scheduled rx updated   ----- Message from Ruddy Corral sent at 03/10/2024  6:23 PM EDT ----- K low. Increase KCl to 80 mEQ bid. Repeat BMP in 1 wk

## 2024-03-12 ENCOUNTER — Other Ambulatory Visit: Payer: Self-pay

## 2024-03-13 ENCOUNTER — Other Ambulatory Visit: Payer: Self-pay

## 2024-03-14 ENCOUNTER — Encounter (HOSPITAL_COMMUNITY): Payer: Self-pay

## 2024-03-14 ENCOUNTER — Observation Stay (HOSPITAL_COMMUNITY)
Admission: EM | Admit: 2024-03-14 | Discharge: 2024-03-16 | Disposition: A | Discharge status: Home / Self Care | Attending: Cardiology | Admitting: Cardiology

## 2024-03-14 ENCOUNTER — Emergency Department (HOSPITAL_COMMUNITY)

## 2024-03-14 ENCOUNTER — Other Ambulatory Visit: Payer: Self-pay

## 2024-03-14 DIAGNOSIS — I129 Hypertensive chronic kidney disease with stage 1 through stage 4 chronic kidney disease, or unspecified chronic kidney disease: Secondary | ICD-10-CM | POA: Diagnosis not present

## 2024-03-14 DIAGNOSIS — I5022 Chronic systolic (congestive) heart failure: Secondary | ICD-10-CM | POA: Diagnosis not present

## 2024-03-14 DIAGNOSIS — N189 Chronic kidney disease, unspecified: Secondary | ICD-10-CM | POA: Diagnosis not present

## 2024-03-14 DIAGNOSIS — I480 Paroxysmal atrial fibrillation: Secondary | ICD-10-CM | POA: Insufficient documentation

## 2024-03-14 DIAGNOSIS — R579 Shock, unspecified: Secondary | ICD-10-CM

## 2024-03-14 DIAGNOSIS — Z95810 Presence of automatic (implantable) cardiac defibrillator: Secondary | ICD-10-CM | POA: Insufficient documentation

## 2024-03-14 DIAGNOSIS — I492 Junctional premature depolarization: Secondary | ICD-10-CM | POA: Insufficient documentation

## 2024-03-14 DIAGNOSIS — E876 Hypokalemia: Secondary | ICD-10-CM | POA: Diagnosis not present

## 2024-03-14 DIAGNOSIS — I5042 Chronic combined systolic (congestive) and diastolic (congestive) heart failure: Secondary | ICD-10-CM | POA: Insufficient documentation

## 2024-03-14 DIAGNOSIS — Z7901 Long term (current) use of anticoagulants: Secondary | ICD-10-CM | POA: Diagnosis not present

## 2024-03-14 DIAGNOSIS — Z79899 Other long term (current) drug therapy: Secondary | ICD-10-CM | POA: Insufficient documentation

## 2024-03-14 DIAGNOSIS — E039 Hypothyroidism, unspecified: Secondary | ICD-10-CM | POA: Diagnosis not present

## 2024-03-14 DIAGNOSIS — Z86718 Personal history of other venous thrombosis and embolism: Secondary | ICD-10-CM | POA: Diagnosis not present

## 2024-03-14 DIAGNOSIS — I472 Ventricular tachycardia, unspecified: Principal | ICD-10-CM

## 2024-03-14 DIAGNOSIS — N1832 Chronic kidney disease, stage 3b: Secondary | ICD-10-CM

## 2024-03-14 DIAGNOSIS — I130 Hypertensive heart and chronic kidney disease with heart failure and stage 1 through stage 4 chronic kidney disease, or unspecified chronic kidney disease: Secondary | ICD-10-CM | POA: Insufficient documentation

## 2024-03-14 DIAGNOSIS — Z4502 Encounter for adjustment and management of automatic implantable cardiac defibrillator: Secondary | ICD-10-CM

## 2024-03-14 DIAGNOSIS — R42 Dizziness and giddiness: Secondary | ICD-10-CM | POA: Diagnosis present

## 2024-03-14 DIAGNOSIS — E1122 Type 2 diabetes mellitus with diabetic chronic kidney disease: Secondary | ICD-10-CM | POA: Insufficient documentation

## 2024-03-14 LAB — BASIC METABOLIC PANEL WITH GFR
Anion gap: 14 (ref 5–15)
BUN: 46 mg/dL — ABNORMAL HIGH (ref 6–20)
CO2: 23 mmol/L (ref 22–32)
Calcium: 9.6 mg/dL (ref 8.9–10.3)
Chloride: 96 mmol/L — ABNORMAL LOW (ref 98–111)
Creatinine, Ser: 2.27 mg/dL — ABNORMAL HIGH (ref 0.61–1.24)
GFR, Estimated: 36 mL/min — ABNORMAL LOW (ref >60)
Glucose, Bld: 109 mg/dL — ABNORMAL HIGH (ref 70–99)
Potassium: 3.2 mmol/L — ABNORMAL LOW (ref 3.5–5.1)
Sodium: 133 mmol/L — ABNORMAL LOW (ref 135–145)

## 2024-03-14 LAB — CBC
HCT: 44.5 % (ref 39.0–52.0)
Hemoglobin: 14.1 g/dL (ref 13.0–17.0)
MCH: 24.7 pg — ABNORMAL LOW (ref 26.0–34.0)
MCHC: 31.7 g/dL (ref 30.0–36.0)
MCV: 77.8 fL — ABNORMAL LOW (ref 80.0–100.0)
Platelets: 240 10*3/uL (ref 150–400)
RBC: 5.72 MIL/uL (ref 4.22–5.81)
RDW: 19.1 % — ABNORMAL HIGH (ref 11.5–15.5)
WBC: 6.1 10*3/uL (ref 4.0–10.5)
nRBC: 0 % (ref 0.0–0.2)

## 2024-03-14 LAB — TROPONIN I (HIGH SENSITIVITY)
Troponin I (High Sensitivity): 42 ng/L — ABNORMAL HIGH (ref <18)
Troponin I (High Sensitivity): 55 ng/L — ABNORMAL HIGH (ref <18)

## 2024-03-14 LAB — TSH: TSH: 13.732 u[IU]/mL — ABNORMAL HIGH (ref 0.350–4.500)

## 2024-03-14 LAB — MAGNESIUM: Magnesium: 2.2 mg/dL (ref 1.7–2.4)

## 2024-03-14 MED ORDER — SODIUM CHLORIDE 0.9 % IV SOLN: 250.0000 mL | INTRAVENOUS | Status: AC | PRN

## 2024-03-14 MED ORDER — APIXABAN 5 MG PO TABS
5.0000 mg | ORAL_TABLET | Freq: Two times a day (BID) | ORAL | Status: DC
  Administered 2024-03-14 – 2024-03-16 (×5): 5 mg via ORAL
  Filled 2024-03-14 (×5): qty 1

## 2024-03-14 MED ORDER — LEVOTHYROXINE SODIUM 25 MCG PO TABS
50.0000 ug | ORAL_TABLET | Freq: Every day | ORAL | Status: DC
  Administered 2024-03-15 – 2024-03-16 (×2): 50 ug via ORAL
  Filled 2024-03-14 (×2): qty 2

## 2024-03-14 MED ORDER — SODIUM CHLORIDE 0.9% FLUSH: 3.0000 mL | INTRAVENOUS | Status: DC | PRN

## 2024-03-14 MED ORDER — POTASSIUM CHLORIDE CRYS ER 20 MEQ PO TBCR
80.0000 meq | EXTENDED_RELEASE_TABLET | Freq: Two times a day (BID) | ORAL | Status: DC
  Administered 2024-03-14 – 2024-03-16 (×4): 80 meq via ORAL
  Filled 2024-03-14 (×3): qty 4
  Filled 2024-03-14: qty 8

## 2024-03-14 MED ORDER — SODIUM CHLORIDE 0.9% FLUSH
3.0000 mL | Freq: Two times a day (BID) | INTRAVENOUS | Status: DC
  Administered 2024-03-14 – 2024-03-16 (×5): 3 mL via INTRAVENOUS

## 2024-03-14 MED ORDER — TORSEMIDE 20 MG PO TABS
80.0000 mg | ORAL_TABLET | Freq: Every day | ORAL | Status: DC
  Administered 2024-03-15 – 2024-03-16 (×2): 80 mg via ORAL
  Filled 2024-03-14 (×2): qty 4

## 2024-03-14 MED ORDER — POTASSIUM CHLORIDE 10 MEQ/100ML IV SOLN
10.0000 meq | INTRAVENOUS | Status: AC
  Administered 2024-03-14 (×3): 10 meq via INTRAVENOUS
  Filled 2024-03-14 (×3): qty 100

## 2024-03-14 MED ORDER — EMPAGLIFLOZIN 10 MG PO TABS
10.0000 mg | ORAL_TABLET | Freq: Every day | ORAL | Status: DC
  Administered 2024-03-15 – 2024-03-16 (×2): 10 mg via ORAL
  Filled 2024-03-14 (×2): qty 1

## 2024-03-14 MED ORDER — POTASSIUM CHLORIDE 20 MEQ PO PACK: 60.0000 meq | PACK | Freq: Every day | ORAL | Status: DC

## 2024-03-14 MED ORDER — ACETAMINOPHEN 500 MG PO TABS: 1000.0000 mg | ORAL_TABLET | ORAL | Status: DC | PRN

## 2024-03-14 MED ORDER — TORSEMIDE 20 MG PO TABS
60.0000 mg | ORAL_TABLET | Freq: Every day | ORAL | Status: DC
  Administered 2024-03-14 – 2024-03-15 (×2): 60 mg via ORAL
  Filled 2024-03-14 (×2): qty 3

## 2024-03-14 MED ORDER — SALINE SPRAY 0.65 % NA SOLN: 2.0000 | NASAL | Status: DC | PRN

## 2024-03-14 MED ORDER — ALLOPURINOL 100 MG PO TABS
100.0000 mg | ORAL_TABLET | Freq: Every day | ORAL | Status: DC
  Administered 2024-03-14 – 2024-03-16 (×3): 100 mg via ORAL
  Filled 2024-03-14 (×3): qty 1

## 2024-03-14 MED ORDER — PANTOPRAZOLE SODIUM 40 MG PO TBEC
40.0000 mg | DELAYED_RELEASE_TABLET | Freq: Every day | ORAL | Status: DC
  Administered 2024-03-14: 40 mg via ORAL
  Filled 2024-03-14: qty 1

## 2024-03-14 MED ORDER — SPIRONOLACTONE 25 MG PO TABS
50.0000 mg | ORAL_TABLET | Freq: Every day | ORAL | Status: DC
  Administered 2024-03-14 – 2024-03-16 (×3): 50 mg via ORAL
  Filled 2024-03-14 (×3): qty 2

## 2024-03-14 MED ORDER — ACETAMINOPHEN 325 MG PO TABS: 650.0000 mg | ORAL_TABLET | ORAL | Status: DC | PRN

## 2024-03-14 MED ORDER — ATORVASTATIN CALCIUM 40 MG PO TABS
40.0000 mg | ORAL_TABLET | Freq: Every day | ORAL | Status: DC
  Administered 2024-03-15 – 2024-03-16 (×2): 40 mg via ORAL
  Filled 2024-03-14 (×2): qty 1

## 2024-03-14 MED ORDER — MEXILETINE HCL 150 MG PO CAPS
150.0000 mg | ORAL_CAPSULE | Freq: Two times a day (BID) | ORAL | Status: DC
  Administered 2024-03-14 – 2024-03-16 (×5): 150 mg via ORAL
  Filled 2024-03-14 (×5): qty 1

## 2024-03-14 MED ORDER — POLYETHYLENE GLYCOL 3350 17 GM/SCOOP PO POWD: 17.0000 g | Freq: Every day | ORAL | Status: DC | PRN

## 2024-03-14 MED ORDER — AMIODARONE HCL 200 MG PO TABS
200.0000 mg | ORAL_TABLET | Freq: Two times a day (BID) | ORAL | Status: DC
  Administered 2024-03-14 – 2024-03-16 (×5): 200 mg via ORAL
  Filled 2024-03-14 (×5): qty 1

## 2024-03-14 MED ORDER — POTASSIUM CHLORIDE CRYS ER 20 MEQ PO TBCR
60.0000 meq | EXTENDED_RELEASE_TABLET | Freq: Every day | ORAL | Status: DC
Start: 2024-03-14 — End: 2024-03-16
  Administered 2024-03-14 – 2024-03-16 (×3): 60 meq via ORAL
  Filled 2024-03-14 (×3): qty 3

## 2024-03-14 MED ORDER — ONDANSETRON HCL 4 MG/2ML IJ SOLN: 4.0000 mg | Freq: Four times a day (QID) | INTRAMUSCULAR | Status: DC | PRN

## 2024-03-14 MED ORDER — POTASSIUM CHLORIDE CRYS ER 20 MEQ PO TBCR
80.0000 meq | EXTENDED_RELEASE_TABLET | Freq: Two times a day (BID) | ORAL | Status: DC
  Filled 2024-03-14: qty 4

## 2024-03-14 NOTE — H&P (Addendum)
 ELECTROPHYSIOLOGY CONSULT NOTE    Patient ID: Bradley Powell MRN: [id number], DOB/AGE: [birth year] 41 y.o.  Admit date: 03/14/2024 Date of Consult: 03/14/2024  Primary Physician: Merl Star, MD Primary Cardiologist: None  Electrophysiologist: Dr. Carolynne Citron   Referring Provider: Dr. Scarlette Currier  Patient Profile: Bradley Powell is a 43 y.o. male with a history of Chronic combined CHF, AFL, CKD, and VT with recurrent ICD shocks in the setting of non-compliance and hypokalemia who is being seen today for the evaluation of ICD shock at the request of Dr. Scarlette Currier.  HPI:  Bradley Powell is a 43 y.o. male is very well known to the EP team.   Admitted in January and April of this year for ICD shock and VT in the setting of hypokalemia and med non-compliance.   Had HF visit and labwork Thursday 4/24 which showed K was 2.9. Was called on 4/28 with changes to his K, which he did Tues and Wednesday of this week.  Taking amiodarone  200 mg BID despite being noted as daily on intake   Today, he felt dizzy and lightheaded and was shocked by his ICD after putting his lunch in the fridge at work.  Otherwise, was his USOH up until today.  He reports taking K as directed, 80 meq breakfast, 60 meq lunch, and 80 meq dinner Tuesday and Wednesday.  No chest pain, undue SOB, or significant edema.   Labs Potassium3.2* (05/01 4098) Magnesium   2.2 (05/01 0819) Creatinine, ser  2.27* (05/01 0819) PLT  240 (05/01 0819) HGB  14.1 (05/01 0819) WBC 6.1 (05/01 0819) Troponin I (High Sensitivity)42* (05/01 0819).    Allergies, Medical, Surgical, Social, and Family Histories have been reviewed and are referenced here-in when relevant for medical decision making.    Physical Exam: Vitals:   03/14/24 0815 03/14/24 0830 03/14/24 0845  BP: 104/79  101/64  Pulse: 73  76  Resp: 15  (!) 34  Temp: 97.7 F (36.5 C)    TempSrc: Oral    SpO2: 100%  100%  Weight:  111.6 kg   Height:  5\' 8"  (1.727 m)      GEN- NAD, A&O x 3, normal affect HEENT: Normocephalic, atraumatic Lungs- CTAB, Normal effort.  Heart- Regular rate and rhythm, No M/G/R.  GI- Soft, NT, ND.  Extremities- No clubbing, cyanosis, or edema    EKG: shows likely NSR with a competing accelerated junctional rhythm (personally reviewed)  TELEMETRY: NSR with a competing accelerated junctional rhythm in 70s (personally reviewed)  DEVICE HISTORY:  [employer] ICD, implanted 02/22/22    Assessment/Plan:  Recurrent VT with ICD shock ATP x 8 > ICD shock. Symptomatic with lightheadedness and dizziness.  He reports increasing his K as directed and listed below.  Continue mexitil  150 mg BID -> ? increase Continue amiodarone  200 mg BID  Junctional rhythm Chronic issue, continue to follow.   Paroxysmal AF Continue eliquis  5 mg BID  Hypokalemia Potassium3.2* (05/01 0819) Magnesium   2.2 (05/01 0819) Creatinine, ser  2.27* (05/01 0819) Keep K > 4.0 and Mg > 2.0  Received 60 meq of IV K  Resume oral K with 80 meq BID today -> then back on 80/60/80 tomorrow.   HFrEF Echo 10/2023 with LVEF 35-40% Will also let HF team know of his admission and ask them to see for recs regarding his medications or any additional considerations.  His size and CKD have unfortunately limited consideration of advanced therapies  For questions or updates, please contact  [facility] Please consult [email or web address] for contact info under Cardiology/STEMI.  Signed, Tylene Galla, PA-C  03/14/2024 9:40 AM   I have seen, examined the patient, and reviewed the above assessment and plan.    HPI: Patient presents to ED following an ICD shock. He was recently seen in HF clinic and found to have K of 2.9. He had otherwise been well. Today, he had an episode of dizziness and lightheadedness, followed by an ICD shock.  No chest pain, SOB, lower extremity edema. He reports compliance with all of his medications.    General: Well developed, in no acute distress.  Neck: No JVD.  Cardiac: Normal rate, regular rhythm.  Resp: Normal work of breathing.  Ext: No edema.  Neuro: No gross focal deficits.  Psych: Normal affect.   Assessment: Bradley Powell is a 43 year old male with a past medical history notable for systolic heart failure status post ICD who presented with another ICD shock in the setting of hypokalemia.  He has had multiple similar admissions already this year.   #ICD shock #VT #Hypokalemia #Chronic systolic heart failure   Plan:  -Continue with aggressive potassium repletion.  We will need to transition this to a home regimen on discharge. -Continue spironolactone  to help with hypokalemia. -Continue antiarrhythmic therapy with mexiletine and amiodarone . -Continue home GDMT regimen. -We will program his device for longer, more aggressive ATP regimens prior to delivering shocks.  -He continues to have profound hypokalemia and requires very high volumes of supplementation. We will ask nephrology to weigh in regarding workup for a potential renal cause for increased potassium excretion aside from diuretic use.   Ardeen Kohler, MD 03/14/2024 9:10 PM

## 2024-03-14 NOTE — TOC CM/SW Note (Signed)
 Transition of Care [facility]) - Inpatient Brief Assessment   Patient Details  Name: Bradley Powell MRN: [id number] Date of Birth: 11-20-1980  Transition of Care [facility]) CM/SW Contact:    Jennett Model, RN Phone Number: 03/14/2024, 2:53 PM   Clinical Narrative: From home with parents, has PCP and insurance on file, states has no HH services in place at this time , has cpap  at home.  States family member will transport them home at [employer] and family is support system, states gets medications from [facility].   Pta self ambulatory.  Patient gives this NCM permission to speak with Mom , Dad.     Transition of Care Asessment: Insurance and Status: Insurance coverage has been reviewed Patient has primary care physician: Yes Home environment has been reviewed: home with parents Prior level of function:: indep Prior/Current Home Services: Current home services (cpap) Social Drivers of Health Review: SDOH reviewed no interventions necessary Readmission risk has been reviewed: Yes Transition of care needs: no transition of care needs at this time

## 2024-03-14 NOTE — ED Notes (Signed)
 Pacemaker reading arrived and given to EDP.

## 2024-03-14 NOTE — ED Notes (Signed)
 X-ray at bedside

## 2024-03-14 NOTE — ED Notes (Signed)
Lab called to add on TSH 

## 2024-03-14 NOTE — ED Provider Notes (Signed)
 [facility] Provider Note   CSN: [id number] Arrival date & time: 03/14/24  4098     History  Chief Complaint  Patient presents with   Pacemaker Problem   Dizziness    Bradley Powell is a 43 y.o. male.  Pt is a 43 yo male with pmhx significant for CHF with NICM s/p AICD placement, hypothyroidism, GERD, and DM.  Pt said he was at work today, felt dizzy and lightheaded and was shocked with his defib.  Pt has been shocked in Jan and in early April.  Pt said he's been taking his meds as directed.  He said his K was low last week and he was told on Monday (4/28) to increase his K.  He's done that on Tues and Wed of this week.  He denies cp.  He does have sob.       Home Medications Prior to Admission medications   Medication Sig Start Date End Date Taking? Authorizing Provider  acetaminophen  (TYLENOL ) 500 MG tablet Take 1,000 mg by mouth as needed for mild pain (pain score 1-3) (Leg pain).    [provider]  allopurinol  (ZYLOPRIM ) 100 MG tablet Take 1 tablet (100 mg total) by mouth daily. Please contact PCP for further refills 10/24/23   Deforest Fast, MD  amiodarone  (PACERONE ) 200 MG tablet Take 1 tablet (200 mg total) by mouth daily. 01/28/24     apixaban  (ELIQUIS ) 5 MG TABS tablet TAKE 1 TABLET BY MOUTH TWICE A DAY 08/17/23   Bensimhon, Rheta Celestine, MD  atorvastatin  (LIPITOR) 40 MG tablet Take 1 tablet (40 mg total) by mouth daily. 03/07/24   Ruddy Corral M, PA-C  diclofenac  Sodium (VOLTAREN ) 1 % GEL Apply 2 g topically daily as needed (Bilateral knee pain).    [provider]  empagliflozin  (JARDIANCE ) 10 MG TABS tablet Take 1 tablet (10 mg total) by mouth daily. 12/28/23   Bensimhon, Rheta Celestine, MD  esomeprazole  (NEXIUM ) 40 MG capsule Take 1 capsule (40 mg total) by mouth 2 (two) times daily. 02/23/24   Brahmbhatt, Manuel Sell, MD  Guaifenesin (MUCINEX MAXIMUM STRENGTH) 1200 MG TB12 Take 1 tablet by mouth 2 (two) times  daily. Patient not taking: Reported on 03/07/2024    [provider]  levothyroxine  (SYNTHROID ) 50 MCG tablet TAKE 1 TABLET (50 MCG TOTAL) BY MOUTH DAILY BEFORE BREAKFAST. TAKE 1 HOUR BEFORE BREAKFAST 10/17/23   Milford, Addison, FNP  metolazone  (ZAROXOLYN ) 5 MG tablet Take 1 tablet (5 mg total) by mouth 2 (two) times a week every Sunday and Wednesday. 12/25/23   Bensimhon, Rheta Celestine, MD  mexiletine (MEXITIL ) 150 MG capsule Take 1 capsule (150 mg total) by mouth every 12 (twelve) hours. 08/09/23   Milford, Arlice Bene, FNP  polyethylene glycol powder (GLYCOLAX /MIRALAX ) 17 GM/SCOOP powder Take 1 CAPFUL (17 g) by mouth daily as needed. 10/24/23   Deforest Fast, MD  potassium chloride  SA (KLOR-CON  M) 20 MEQ tablet Take 4 tablets (80 mEq total) by mouth 2 (two) times daily. And 2 tablets at night 03/11/24   Ruddy Corral M, PA-C  sodium chloride  (OCEAN) 0.65 % nasal spray Place 2 sprays into the nose as needed for congestion.    [provider]  spironolactone  (ALDACTONE ) 50 MG tablet Take 1 tablet (50 mg total) by mouth daily. 02/22/24   Tylene Galla, PA-C  torsemide  (DEMADEX ) 20 MG tablet Take 4 tablets (80mg )  in the morning, and 3 tablets (60mg ) in the afternoon 11/30/23  Deforest Fast, MD      Allergies    Entresto  [sacubitril -valsartan ]    Review of Systems   Review of Systems  Respiratory:  Positive for shortness of breath.   Neurological:  Positive for dizziness and light-headedness.  All other systems reviewed and are negative.   Physical Exam Updated Vital Signs BP 98/75   Pulse 70   Temp 97.6 F (36.4 C) (Oral)   Resp (!) 36   Ht 5\' 8"  (1.727 m)   Wt 111.6 kg   SpO2 100%   BMI 37.41 kg/m  Physical Exam Vitals and nursing note reviewed.  Constitutional:      Appearance: Normal appearance. He is obese.  HENT:     Head: Normocephalic and atraumatic.     Right Ear: External ear normal.     Left Ear: External ear normal.     Nose: Nose  normal.     Mouth/Throat:     Mouth: Mucous membranes are moist.     Pharynx: Oropharynx is clear.  Eyes:     Extraocular Movements: Extraocular movements intact.     Conjunctiva/sclera: Conjunctivae normal.     Pupils: Pupils are equal, round, and reactive to light.  Cardiovascular:     Rate and Rhythm: Normal rate and regular rhythm.     Pulses: Normal pulses.     Heart sounds: Normal heart sounds.  Pulmonary:     Effort: Pulmonary effort is normal.     Breath sounds: Normal breath sounds.  Abdominal:     General: Abdomen is flat. Bowel sounds are normal.     Palpations: Abdomen is soft.  Musculoskeletal:     Cervical back: Normal range of motion and neck supple.     Right lower leg: Edema present.     Left lower leg: Edema present.  Skin:    General: Skin is warm.     Capillary Refill: Capillary refill takes less than 2 seconds.  Neurological:     General: No focal deficit present.     Mental Status: He is alert and oriented to person, place, and time.  Psychiatric:        Mood and Affect: Mood normal.        Behavior: Behavior normal.        Thought Content: Thought content normal.        Judgment: Judgment normal.     ED Results / Procedures / Treatments   Labs (all labs ordered are listed, but only abnormal results are displayed) Labs Reviewed  BASIC METABOLIC PANEL WITH GFR - Abnormal; Notable for the following components:      Result Value   Sodium 133 (*)    Potassium 3.2 (*)    Chloride 96 (*)    Glucose, Bld 109 (*)    BUN 46 (*)    Creatinine, Ser 2.27 (*)    GFR, Estimated 36 (*)    All other components within normal limits  CBC - Abnormal; Notable for the following components:   MCV 77.8 (*)    MCH 24.7 (*)    RDW 19.1 (*)    All other components within normal limits  TROPONIN I (HIGH SENSITIVITY) - Abnormal; Notable for the following components:   Troponin I (High Sensitivity) 42 (*)    All other components within normal limits  MAGNESIUM   TSH   TROPONIN I (HIGH SENSITIVITY)    EKG EKG Interpretation Date/Time:  Thursday Mar 14 2024 08:14:34 EDT Ventricular Rate:  72 PR Interval:  QRS Duration:  112 QT Interval:  445 QTC Calculation: 487 R Axis:   143  Text Interpretation: No significant change since last tracing Confirmed by Sueellen Emery [phone]) on 03/14/2024 8:37:47 AM  AICD interrogation info in media section Radiology DG Chest Port 1 View Result Date: 03/14/2024 CLINICAL DATA:  cp EXAM: PORTABLE CHEST - 1 VIEW COMPARISON:  Multiple, most recently, February 17, 2024 FINDINGS: No focal airspace consolidation, pleural effusion, or pneumothorax. Mild cardiomegaly. Left chest pacemaker/AICD with a single lead terminating in the right ventricle. No acute fracture or destructive lesion. Multilevel thoracic osteophytosis. IMPRESSION: No acute cardiopulmonary abnormality. Electronically Signed   By: Rance Burrows M.D.   On: 03/14/2024 08:38    Procedures Procedures    Medications Ordered in ED Medications  potassium chloride  10 mEq in 100 mL IVPB (0 mEq Intravenous Stopped 03/14/24 1203)    ED Course/ Medical Decision Making/ A&P                                 Medical Decision Making Amount and/or Complexity of Data Reviewed Labs: ordered. Radiology: ordered.  Risk Prescription drug management. Decision regarding hospitalization.   This patient presents to the ED for concern of aicd firing, this involves an extensive number of treatment options, and is a complaint that carries with it a high risk of complications and morbidity.  The differential diagnosis includes hypokalemia/hypomagnesemia, cad   Co morbidities that complicate the patient evaluation  CHF with NICM s/p AICD placement, hypothyroidism, GERD, and DM   Additional history obtained:  Additional history obtained from epic chart review External records from outside source obtained and reviewed including EMS Report   Lab Tests:  I Ordered, and  personally interpreted labs.  The pertinent results include:  cbc nl, bmp with k low at 3.2 (k 2.9 on 4/24, but 4.0 on 4/10), bun 46 and cr 2.27 (stable)   Imaging Studies ordered:  I ordered imaging studies including cxr  I independently visualized and interpreted imaging which showed No acute cardiopulmonary abnormality.  I agree with the radiologist interpretation   Cardiac Monitoring:  The patient was maintained on a cardiac monitor.  I personally viewed and interpreted the cardiac monitored which showed an underlying rhythm of: junctional rhythm (chronic)   Medicines ordered and prescription drug management:  I ordered medication including kcl  for hypokalemia  Reevaluation of the patient after these medicines showed that the patient improved I have reviewed the patients home medicines and have made adjustments as needed  Consultations Obtained:  I requested consultation with cardiology,  and discussed lab and imaging findings as well as pertinent plan - they will admit   Problem List / ED Course:  VT:  recurrent.  EP cards to admit Hypokalemia:  pt given IV K   Reevaluation:  After the interventions noted above, I reevaluated the patient and found that they have :improved   Social Determinants of Health:  Lives at home.  Works as a Runner, broadcasting/film/video.   Dispostion:  After consideration of the diagnostic results and the patients response to treatment, I feel that the patent would benefit from admission.          Final Clinical Impression(s) / ED Diagnoses Final diagnoses:  VT (ventricular tachycardia) (HCC)  Hypokalemia  AICD discharge  Stage 3b chronic kidney disease (HCC)    Rx / DC Orders ED Discharge Orders     None  Sueellen Emery, MD 03/14/24 [phone]

## 2024-03-14 NOTE — ED Triage Notes (Signed)
 Pt arrives via [facility] from work. He states that he was bending over to put food in a fridge when he suddenly became dizzy. Soon thereafter he felt is ICD fire once. Pt denies CP or SOB prior to incident, but does endorse DOE and has bilateral leg swelling. Pt also had ICD fire approximately one month ago while unconscious.

## 2024-03-14 NOTE — ED Notes (Signed)
 Pt's heart rate noted to intermittently increasing into 140s and them almost immediately drop back into 90s.  EDP made aware.

## 2024-03-15 DIAGNOSIS — I472 Ventricular tachycardia, unspecified: Secondary | ICD-10-CM | POA: Diagnosis not present

## 2024-03-15 LAB — MAGNESIUM: Magnesium: 2.2 mg/dL (ref 1.7–2.4)

## 2024-03-15 LAB — BASIC METABOLIC PANEL WITH GFR
Anion gap: 16 — ABNORMAL HIGH (ref 5–15)
BUN: 40 mg/dL — ABNORMAL HIGH (ref 6–20)
CO2: 25 mmol/L (ref 22–32)
Calcium: 9.2 mg/dL (ref 8.9–10.3)
Chloride: 96 mmol/L — ABNORMAL LOW (ref 98–111)
Creatinine, Ser: 2.2 mg/dL — ABNORMAL HIGH (ref 0.61–1.24)
GFR, Estimated: 38 mL/min — ABNORMAL LOW (ref >60)
Glucose, Bld: 123 mg/dL — ABNORMAL HIGH (ref 70–99)
Potassium: 3.7 mmol/L (ref 3.5–5.1)
Sodium: 137 mmol/L (ref 135–145)

## 2024-03-15 MED ORDER — PANTOPRAZOLE SODIUM 40 MG PO TBEC
40.0000 mg | DELAYED_RELEASE_TABLET | Freq: Two times a day (BID) | ORAL | Status: DC
  Administered 2024-03-15 – 2024-03-16 (×4): 40 mg via ORAL
  Filled 2024-03-15 (×2): qty 1
  Filled 2024-03-15: qty 2
  Filled 2024-03-15: qty 1

## 2024-03-15 NOTE — Discharge Summary (Addendum)
 ELECTROPHYSIOLOGY DISCHARGE SUMMARY    Patient ID: Bradley Powell,  MRN: [id number], DOB/AGE: Dec 05, 1980 43 y.o.  Admit date: 03/14/2024 Discharge date: 03/16/2024 Change date of service above   Primary Care Physician: Merl Star, MD  Primary Cardiologist: Dr. Julane Ny Electrophysiologist: Dr. Carolynne Citron   Primary Discharge Diagnosis:  Ventricular Tachycardia Hypokalemia  Secondary Discharge Diagnosis:  Chronic combined CHF  Brief HPI: Bradley Powell is a 43 y.o. male with a history of recurrent VT, chronic combined CHF, AFL, CKD, issues with non-compliance and Hypokalemia admitted for recurrent ICD.   Hospital Course:  The patient was admitted after ICD shock at work. Recent admission for same. Had labwork 4/24 which showed recurrent Hypokalemia but was unable to start increase K supp until 4/28. IV K supp was given but otherwise resumed on home regimen. Improvement of K on his usual dosing continues to raise concerns for non-compliance, which he denies at this time. Nephrology curbside felt to be likely urinary loss if compliant, and f/u made for 5/14 with Dr. Lydia Sams.   They were monitored on telemetry which demonstrated no further VT.  VT zone 1 was adjusted to be more aggressive. K stable at 3.6. The patient was examined and considered to be stable for discharge.    Reviewed [facility] driving restrictions with pt in the presence of his mother and father as well.  The patient will be seen back by EP APP in 1 week for post hospital care.   Discharge Vitals: Vitals:   03/16/24 0800 03/16/24 0956 03/16/24 1034 03/16/24 1220  BP: (!) 92/52 (!) 88/54 106/73 100/84  Pulse: 72 72 74 78  Resp: 18 18  18   Temp: (!) 97.4 F (36.3 C) 97.7 F (36.5 C)    TempSrc: Oral Oral    SpO2: 100% 100% 99% 100%  Weight:      Height:       Discharge Medications:  Allergies as of 03/16/2024       Reactions   Entresto  [sacubitril -valsartan ] Nausea And Vomiting, Other (See Comments)    Lightheaded, fainting        Medication List     TAKE these medications    allopurinol  100 MG tablet Commonly known as: ZYLOPRIM  Take 1 tablet (100 mg total) by mouth daily. Please contact PCP for further refills   amiodarone  200 MG tablet Commonly known as: PACERONE  Take 1 tablet (200 mg total) by mouth daily. What changed: when to take this   atorvastatin  40 MG tablet Commonly known as: LIPITOR Take 1 tablet (40 mg total) by mouth daily.   Eliquis  5 MG Tabs tablet Generic drug: apixaban  TAKE 1 TABLET BY MOUTH TWICE A DAY   empagliflozin  10 MG Tabs tablet Commonly known as: Jardiance  Take 1 tablet (10 mg total) by mouth daily.   esomeprazole  40 MG capsule Commonly known as: NEXIUM  Take 1 capsule (40 mg total) by mouth 2 (two) times daily.   levothyroxine  50 MCG tablet Commonly known as: SYNTHROID  TAKE 1 TABLET (50 MCG TOTAL) BY MOUTH DAILY BEFORE BREAKFAST. TAKE 1 HOUR BEFORE BREAKFAST What changed: additional instructions   metolazone  5 MG tablet Commonly known as: ZAROXOLYN  Take 1 tablet (5 mg total) by mouth 2 (two) times a week every Sunday and Wednesday.   mexiletine 150 MG capsule Commonly known as: MEXITIL  Take 1 capsule (150 mg total) by mouth every 12 (twelve) hours.   polyethylene glycol powder 17 GM/SCOOP powder Commonly known as: GLYCOLAX /MIRALAX  Take 1 CAPFUL (17 g) by mouth daily  as needed. What changed: reasons to take this   potassium chloride  SA 20 MEQ tablet Commonly known as: KLOR-CON  M Take 4 tablets (80 mEq total) by mouth 2 (two) times daily. And 2 tablets at night What changed:  how much to take when to take this additional instructions   spironolactone  50 MG tablet Commonly known as: ALDACTONE  Take 1 tablet (50 mg total) by mouth daily. What changed: Another medication with the same name was removed. Continue taking this medication, and follow the directions you see here.   torsemide  20 MG tablet Commonly known as:  DEMADEX  Take 4 tablets (80mg )  in the morning, and 3 tablets (60mg ) in the afternoon   Voltaren  1 % Gel Generic drug: diclofenac  Sodium Apply 1 Application topically daily as needed (pain).        Disposition: Home with usual follow up as in AVS  Signed, Metta Actis, PA-C  03/16/2024 12:33 PM    I have seen, examined the patient, and reviewed the above assessment and plan.    Hospital course: Patient presented following an ICD shock at home.  He was found to have potassium of 2.9 on recent outpatient visit.  Had some hypokalemia, although not as impressive as previous events, when arriving to ED here.  Potassium was repleted.  Programming changes were made to his device to provide more ATP.  There were no acute events and patient was discharged home.  General: Well developed, in no acute distress.  Neck: No JVD.  Cardiac: Normal rate, regular rhythm.  Resp: Normal work of breathing.  Ext: No edema.  Neuro: No gross focal deficits.  Psych: Normal affect.   Plan:  #ICD shock #VT #Hypokalemia #Chronic systolic heart failure  :  Plan:  -Continue with aggressive potassium repletion while inpatient. Continuing home regimen.  -Continue spironolactone  to help with hypokalemia. -Continue antiarrhythmic therapy with mexiletine and amiodarone . -Continue home GDMT regimen. -We have reprogrammed his device for longer, more aggressive ATP regimens prior to delivering shocks. See Andy's note frome 5/2  with details.  -We have asked for Nephrology evaluation to see if there is any excessive renal loss of potassium that can be mitigated with medication changes. They have arranged for outpatient follow up in short order.  Duration of Discharge Encounter: 50 minutes MD time: 30 minutes  Ardeen Kohler, MD 03/22/2024 1:52 PM

## 2024-03-15 NOTE — Progress Notes (Cosign Needed)
 Patient Name: Bradley Powell Date of Encounter: 03/15/2024  Primary Cardiologist: None Electrophysiologist: Manya Sells, MD  Interval Summary   The patient is doing well today.  At this time, the patient denies chest pain, shortness of breath, or any new concerns.  Remains anxious about ability to keep K up as outpatient  Vital Signs    Vitals:   03/14/24 2018 03/15/24 0104 03/15/24 0421 03/15/24 0749  BP: 117/82 121/88 96/61 (!) 94/59  Pulse: 77 72 65 69  Resp: 20 20 20    Temp: 98.2 F (36.8 C) 98.4 F (36.9 C) 97.6 F (36.4 C) (!) 97.5 F (36.4 C)  TempSrc: Oral Oral Oral Oral  SpO2: 100% 100% 100% 100%  Weight:   110.4 kg   Height:        Intake/Output Summary (Last 24 hours) at 03/15/2024 0912 Last data filed at 03/15/2024 0749 Gross per 24 hour  Intake 1080 ml  Output --  Net 1080 ml   Filed Weights   03/14/24 0830 03/14/24 1429 03/15/24 0421  Weight: 111.6 kg 111.9 kg 110.4 kg    Physical Exam    GEN- The patient is well appearing, alert and oriented x 3 today.   Lungs- Clear to ausculation bilaterally, normal work of breathing Cardiac- Regular rate and rhythm, no murmurs, rubs or gallops GI- soft, NT, ND, + BS Extremities- no clubbing or cyanosis. No edema  Telemetry    NSR 60-70s (personally reviewed)  Hospital Course    Bradley Powell is a 43 y.o. male with a history of Chronic combined CHF, AFL, CKD, and VT with recurrent ICD shocks in the setting of non-compliance and hypokalemia who is being seen today for the evaluation of ICD shock at the request of Bradley Powell.   Assessment & Plan    Recurrent VT with ICD shock No further.  Continue mexitil  150 mg BID -> ? increase Continue amiodarone  200 mg BID Potassium3.7 (05/02 0245) Magnesium   2.2 (05/02 0245) Creatinine, ser  2.20* (05/02 0245) Keep K > 4.0 and Mg > 2.0    Junctional rhythm Chronic issue, continue to follow.    Paroxysmal AF Continue eliquis  5 mg BID    Hypokalemia Potassium3.7 (05/02 0245) Magnesium   2.2 (05/02 0245) Creatinine, ser  2.20* (05/02 0245) Keep K > 4.0 and Mg > 2.0  Have curbsided renal who will review his chart and make any potential recommendations if the thought he is compliant but remains hypokalemic despite significant oral K intake.    HFrEF Echo 10/2023 with LVEF 35-40% HF team has no further recs at this time.  Suspicion of ongoing non-compliance which pt has previously admitted to, but denies at this time.  His size and CKD have unfortunately limited consideration of advanced therapies  If K remains stable, likely home tomorrow.   For questions or updates, please contact [facility] Please consult [email or web address] for contact info under Cardiology/STEMI.  Signed, Bradley Galla, PA-C  03/15/2024, 9:12 AM   I have seen, examined the patient, and reviewed the above assessment and plan.    Interval:  No acute overnight events. No sustained arrhythmias.   General: Well developed, in no acute distress.  Neck: No JVD.  Cardiac: Normal rate, regular rhythm.  Resp: Normal work of breathing.  Ext: No edema.  Neuro: No gross focal deficits.  Psych: Normal affect.   Assessment: [name] is a 43 year old male with a past medical history notable for systolic heart failure status post ICD who  presented with another ICD shock in the setting of hypokalemia.  He has had multiple similar admissions already this year.   #ICD shock #VT #Hypokalemia #Chronic systolic heart failure   Plan:  -Continue with aggressive potassium repletion while inpatient. Continuing home regimen.  -Continue spironolactone  to help with hypokalemia. -Continue antiarrhythmic therapy with mexiletine and amiodarone . -Continue home GDMT regimen. -We will program his device for longer, more aggressive ATP regimens prior to delivering shocks. See Bradley Powell's note today with details.  -We have asked for Nephrology evaluation to see  if there is any excessive renal loss of potassium that can be mitigated with medication changes. They have arranged for outpatient follow up in short order.  Anticipate discharge tomorrow.   Bradley Kohler, MD, [facility], [facility] Cardiac Electrophysiology

## 2024-03-15 NOTE — Progress Notes (Addendum)
  Device reprogrammed as below  Adjusted VT1 zone only # of bursts changed increased from 4 to 6  CL left at 81% for ATP1 CL for ATP2 decreased from 84% to 78%  Confirmed with MD, that although second zone is Ramp and aggressive, hopefully will improve success rate of ATP.  If accelerates pt prior to shock in the future, can re-evaluate.

## 2024-03-15 NOTE — Plan of Care (Signed)
   Problem: Clinical Measurements: Goal: Diagnostic test results will improve Outcome: Progressing Goal: Respiratory complications will improve Outcome: Progressing

## 2024-03-16 DIAGNOSIS — I472 Ventricular tachycardia, unspecified: Secondary | ICD-10-CM | POA: Diagnosis not present

## 2024-03-16 LAB — BASIC METABOLIC PANEL WITH GFR
Anion gap: 15 (ref 5–15)
BUN: 38 mg/dL — ABNORMAL HIGH (ref 6–20)
CO2: 25 mmol/L (ref 22–32)
Calcium: 9.2 mg/dL (ref 8.9–10.3)
Chloride: 98 mmol/L (ref 98–111)
Creatinine, Ser: 2.16 mg/dL — ABNORMAL HIGH (ref 0.61–1.24)
GFR, Estimated: 39 mL/min — ABNORMAL LOW (ref >60)
Glucose, Bld: 78 mg/dL (ref 70–99)
Potassium: 3.6 mmol/L (ref 3.5–5.1)
Sodium: 138 mmol/L (ref 135–145)

## 2024-03-16 LAB — MAGNESIUM: Magnesium: 2.3 mg/dL (ref 1.7–2.4)

## 2024-03-16 NOTE — Progress Notes (Signed)
   Patient Name: Bradley Powell Date of Encounter: 03/16/2024 [facility] Cardiologist: None   Interval Summary  .    No acute overnight events. Slept comfortably. No arrhythmias on tele. K stable at 3.6 this morning.   Vital Signs .    Vitals:   03/16/24 0408 03/16/24 0800 03/16/24 0956 03/16/24 1034  BP: 102/66 (!) 92/52 (!) 88/54 106/73  Pulse: 74 72 72 74  Resp: 19 18 18    Temp: 97.6 F (36.4 C) (!) 97.4 F (36.3 C) 97.7 F (36.5 C)   TempSrc: Oral Oral Oral   SpO2: 100% 100% 100% 99%  Weight: 109.5 kg     Height:        Intake/Output Summary (Last 24 hours) at 03/16/2024 1157 Last data filed at 03/16/2024 1142 Gross per 24 hour  Intake 603 ml  Output --  Net 603 ml      03/16/2024    4:08 AM 03/15/2024    4:21 AM 03/14/2024    2:29 PM  Last 3 Weights  Weight (lbs) 241 lb 6.5 oz 243 lb 6.2 oz 246 lb 11.1 oz  Weight (kg) 109.5 kg 110.4 kg 111.9 kg      Telemetry/ECG    No VAs - Personally Reviewed  Physical Exam .    General: Well developed, in no acute distress.  Neck: No JVD.  Cardiac: Normal rate, regular rhythm.  Resp: Normal work of breathing.  Ext: No edema.  Neuro: No gross focal deficits.  Psych: Normal affect.   Assessment & Plan .     [name] is a 43 year old male with a past medical history notable for systolic heart failure status post ICD who presented with another ICD shock in the setting of hypokalemia.  He has had multiple similar admissions already this year.   #ICD shock #VT #Hypokalemia #Chronic systolic heart failure  :  Plan:  -Continue with aggressive potassium repletion while inpatient. Continuing home regimen.  -Continue spironolactone  to help with hypokalemia. -Continue antiarrhythmic therapy with mexiletine and amiodarone . -Continue home GDMT regimen. -We have reprogrammed his device for longer, more aggressive ATP regimens prior to delivering shocks. See Andy's note frome 5/2  with details.  -We have  asked for Nephrology evaluation to see if there is any excessive renal loss of potassium that can be mitigated with medication changes. They have arranged for outpatient follow up in short order.  Discharge today.   For questions or updates, please contact [facility] Please consult [email or web address] for contact info under     Signed, Ardeen Kohler, MD

## 2024-03-18 ENCOUNTER — Other Ambulatory Visit: Payer: Self-pay | Admitting: Gastroenterology

## 2024-03-18 ENCOUNTER — Other Ambulatory Visit (HOSPITAL_COMMUNITY): Payer: Self-pay

## 2024-03-18 ENCOUNTER — Encounter: Payer: Self-pay | Admitting: Cardiology

## 2024-03-18 DIAGNOSIS — K7460 Unspecified cirrhosis of liver: Secondary | ICD-10-CM

## 2024-03-18 NOTE — Telephone Encounter (Signed)
 Test billing returns paid claims for Entresto  with a $4 copay. Insurance issues seem to be resolved at this time.

## 2024-03-20 ENCOUNTER — Ambulatory Visit (HOSPITAL_COMMUNITY)
Admission: RE | Admit: 2024-03-20 | Discharge: 2024-03-20 | Disposition: A | Discharge status: Home / Self Care | Source: Ambulatory Visit | Attending: Internal Medicine | Admitting: Internal Medicine

## 2024-03-20 DIAGNOSIS — I5042 Chronic combined systolic (congestive) and diastolic (congestive) heart failure: Secondary | ICD-10-CM | POA: Diagnosis not present

## 2024-03-20 LAB — BASIC METABOLIC PANEL WITH GFR
Anion gap: 13 (ref 5–15)
BUN: 40 mg/dL — ABNORMAL HIGH (ref 6–20)
CO2: 25 mmol/L (ref 22–32)
Calcium: 9.2 mg/dL (ref 8.9–10.3)
Chloride: 94 mmol/L — ABNORMAL LOW (ref 98–111)
Creatinine, Ser: 2.18 mg/dL — ABNORMAL HIGH (ref 0.61–1.24)
GFR, Estimated: 38 mL/min — ABNORMAL LOW (ref >60)
Glucose, Bld: 116 mg/dL — ABNORMAL HIGH (ref 70–99)
Potassium: 5.1 mmol/L (ref 3.5–5.1)
Sodium: 132 mmol/L — ABNORMAL LOW (ref 135–145)

## 2024-03-21 ENCOUNTER — Other Ambulatory Visit: Payer: Self-pay

## 2024-03-21 ENCOUNTER — Inpatient Hospital Stay
Admission: EM | Admit: 2024-03-21 | Discharge: 2024-03-30 | DRG: 641 | Disposition: A | Discharge status: Home / Self Care | Attending: Osteopathic Medicine | Admitting: Osteopathic Medicine

## 2024-03-21 ENCOUNTER — Telehealth (HOSPITAL_COMMUNITY): Payer: Self-pay

## 2024-03-21 ENCOUNTER — Emergency Department

## 2024-03-21 DIAGNOSIS — N1832 Chronic kidney disease, stage 3b: Secondary | ICD-10-CM | POA: Diagnosis present

## 2024-03-21 DIAGNOSIS — E039 Hypothyroidism, unspecified: Secondary | ICD-10-CM | POA: Diagnosis present

## 2024-03-21 DIAGNOSIS — I4589 Other specified conduction disorders: Secondary | ICD-10-CM | POA: Diagnosis present

## 2024-03-21 DIAGNOSIS — Z1152 Encounter for screening for COVID-19: Secondary | ICD-10-CM

## 2024-03-21 DIAGNOSIS — S199XXA Unspecified injury of neck, initial encounter: Secondary | ICD-10-CM | POA: Diagnosis not present

## 2024-03-21 DIAGNOSIS — I5042 Chronic combined systolic (congestive) and diastolic (congestive) heart failure: Secondary | ICD-10-CM | POA: Diagnosis present

## 2024-03-21 DIAGNOSIS — Z7901 Long term (current) use of anticoagulants: Secondary | ICD-10-CM

## 2024-03-21 DIAGNOSIS — R0602 Shortness of breath: Secondary | ICD-10-CM | POA: Diagnosis not present

## 2024-03-21 DIAGNOSIS — Z95810 Presence of automatic (implantable) cardiac defibrillator: Secondary | ICD-10-CM | POA: Diagnosis present

## 2024-03-21 DIAGNOSIS — Z4502 Encounter for adjustment and management of automatic implantable cardiac defibrillator: Secondary | ICD-10-CM | POA: Diagnosis not present

## 2024-03-21 DIAGNOSIS — I482 Chronic atrial fibrillation, unspecified: Secondary | ICD-10-CM | POA: Diagnosis present

## 2024-03-21 DIAGNOSIS — I130 Hypertensive heart and chronic kidney disease with heart failure and stage 1 through stage 4 chronic kidney disease, or unspecified chronic kidney disease: Secondary | ICD-10-CM | POA: Diagnosis present

## 2024-03-21 DIAGNOSIS — G4733 Obstructive sleep apnea (adult) (pediatric): Secondary | ICD-10-CM | POA: Diagnosis present

## 2024-03-21 DIAGNOSIS — I4891 Unspecified atrial fibrillation: Secondary | ICD-10-CM | POA: Diagnosis not present

## 2024-03-21 DIAGNOSIS — E876 Hypokalemia: Principal | ICD-10-CM

## 2024-03-21 DIAGNOSIS — E871 Hypo-osmolality and hyponatremia: Secondary | ICD-10-CM | POA: Diagnosis present

## 2024-03-21 DIAGNOSIS — Z79890 Hormone replacement therapy (postmenopausal): Secondary | ICD-10-CM

## 2024-03-21 DIAGNOSIS — S01511A Laceration without foreign body of lip, initial encounter: Secondary | ICD-10-CM | POA: Diagnosis present

## 2024-03-21 DIAGNOSIS — N183 Chronic kidney disease, stage 3 unspecified: Secondary | ICD-10-CM | POA: Diagnosis not present

## 2024-03-21 DIAGNOSIS — Z888 Allergy status to other drugs, medicaments and biological substances status: Secondary | ICD-10-CM

## 2024-03-21 DIAGNOSIS — Z79899 Other long term (current) drug therapy: Secondary | ICD-10-CM

## 2024-03-21 DIAGNOSIS — Z7984 Long term (current) use of oral hypoglycemic drugs: Secondary | ICD-10-CM

## 2024-03-21 DIAGNOSIS — I517 Cardiomegaly: Secondary | ICD-10-CM | POA: Diagnosis not present

## 2024-03-21 DIAGNOSIS — R7989 Other specified abnormal findings of blood chemistry: Secondary | ICD-10-CM | POA: Insufficient documentation

## 2024-03-21 DIAGNOSIS — I428 Other cardiomyopathies: Secondary | ICD-10-CM

## 2024-03-21 DIAGNOSIS — R42 Dizziness and giddiness: Secondary | ICD-10-CM | POA: Diagnosis not present

## 2024-03-21 DIAGNOSIS — I493 Ventricular premature depolarization: Secondary | ICD-10-CM | POA: Diagnosis present

## 2024-03-21 DIAGNOSIS — S0993XA Unspecified injury of face, initial encounter: Secondary | ICD-10-CM | POA: Diagnosis not present

## 2024-03-21 DIAGNOSIS — R55 Syncope and collapse: Secondary | ICD-10-CM | POA: Diagnosis not present

## 2024-03-21 DIAGNOSIS — I472 Ventricular tachycardia, unspecified: Secondary | ICD-10-CM | POA: Diagnosis present

## 2024-03-21 DIAGNOSIS — I4892 Unspecified atrial flutter: Secondary | ICD-10-CM | POA: Diagnosis present

## 2024-03-21 DIAGNOSIS — Z8249 Family history of ischemic heart disease and other diseases of the circulatory system: Secondary | ICD-10-CM

## 2024-03-21 DIAGNOSIS — E669 Obesity, unspecified: Secondary | ICD-10-CM | POA: Diagnosis present

## 2024-03-21 DIAGNOSIS — I440 Atrioventricular block, first degree: Secondary | ICD-10-CM | POA: Diagnosis present

## 2024-03-21 DIAGNOSIS — T502X5A Adverse effect of carbonic-anhydrase inhibitors, benzothiadiazides and other diuretics, initial encounter: Secondary | ICD-10-CM | POA: Diagnosis present

## 2024-03-21 DIAGNOSIS — Y92000 Kitchen of unspecified non-institutional (private) residence as  the place of occurrence of the external cause: Secondary | ICD-10-CM

## 2024-03-21 DIAGNOSIS — S0990XA Unspecified injury of head, initial encounter: Secondary | ICD-10-CM | POA: Diagnosis not present

## 2024-03-21 DIAGNOSIS — Z6837 Body mass index (BMI) 37.0-37.9, adult: Secondary | ICD-10-CM

## 2024-03-21 DIAGNOSIS — E66812 Obesity, class 2: Secondary | ICD-10-CM | POA: Diagnosis present

## 2024-03-21 DIAGNOSIS — R0989 Other specified symptoms and signs involving the circulatory and respiratory systems: Secondary | ICD-10-CM | POA: Diagnosis not present

## 2024-03-21 DIAGNOSIS — W19XXXA Unspecified fall, initial encounter: Secondary | ICD-10-CM | POA: Diagnosis not present

## 2024-03-21 DIAGNOSIS — W1839XA Other fall on same level, initial encounter: Secondary | ICD-10-CM | POA: Diagnosis present

## 2024-03-21 LAB — CBC WITH DIFFERENTIAL/PLATELET
Abs Immature Granulocytes: 0.05 10*3/uL (ref 0.00–0.07)
Basophils Absolute: 0.1 10*3/uL (ref 0.0–0.1)
Basophils Relative: 1 %
Eosinophils Absolute: 0.2 10*3/uL (ref 0.0–0.5)
Eosinophils Relative: 2 %
HCT: 43.4 % (ref 39.0–52.0)
Hemoglobin: 13.4 g/dL (ref 13.0–17.0)
Immature Granulocytes: 1 %
Lymphocytes Relative: 5 %
Lymphs Abs: 0.4 10*3/uL — ABNORMAL LOW (ref 0.7–4.0)
MCH: 24 pg — ABNORMAL LOW (ref 26.0–34.0)
MCHC: 30.9 g/dL (ref 30.0–36.0)
MCV: 77.6 fL — ABNORMAL LOW (ref 80.0–100.0)
Monocytes Absolute: 0.8 10*3/uL (ref 0.1–1.0)
Monocytes Relative: 9 %
Neutro Abs: 6.7 10*3/uL (ref 1.7–7.7)
Neutrophils Relative %: 82 %
Platelets: 256 10*3/uL (ref 150–400)
RBC: 5.59 MIL/uL (ref 4.22–5.81)
RDW: 18.2 % — ABNORMAL HIGH (ref 11.5–15.5)
WBC: 8.2 10*3/uL (ref 4.0–10.5)
nRBC: 0 % (ref 0.0–0.2)

## 2024-03-21 LAB — BASIC METABOLIC PANEL WITH GFR
Anion gap: 13 (ref 5–15)
BUN: 39 mg/dL — ABNORMAL HIGH (ref 6–20)
CO2: 26 mmol/L (ref 22–32)
Calcium: 8.9 mg/dL (ref 8.9–10.3)
Chloride: 95 mmol/L — ABNORMAL LOW (ref 98–111)
Creatinine, Ser: 1.99 mg/dL — ABNORMAL HIGH (ref 0.61–1.24)
GFR, Estimated: 43 mL/min — ABNORMAL LOW (ref >60)
Glucose, Bld: 107 mg/dL — ABNORMAL HIGH (ref 70–99)
Potassium: 3.1 mmol/L — ABNORMAL LOW (ref 3.5–5.1)
Sodium: 134 mmol/L — ABNORMAL LOW (ref 135–145)

## 2024-03-21 LAB — MAGNESIUM: Magnesium: 2 mg/dL (ref 1.7–2.4)

## 2024-03-21 LAB — TROPONIN I (HIGH SENSITIVITY)
Troponin I (High Sensitivity): 71 ng/L — ABNORMAL HIGH (ref <18)
Troponin I (High Sensitivity): 77 ng/L — ABNORMAL HIGH (ref <18)
Troponin I (High Sensitivity): 40 ng/L — ABNORMAL HIGH (ref <18)

## 2024-03-21 MED ORDER — PANTOPRAZOLE SODIUM 40 MG PO TBEC
40.0000 mg | DELAYED_RELEASE_TABLET | Freq: Every day | ORAL | Status: DC
  Administered 2024-03-22 – 2024-03-30 (×9): 40 mg via ORAL
  Filled 2024-03-21 (×9): qty 1

## 2024-03-21 MED ORDER — ALLOPURINOL 100 MG PO TABS
100.0000 mg | ORAL_TABLET | Freq: Every day | ORAL | Status: DC
  Administered 2024-03-22 – 2024-03-30 (×9): 100 mg via ORAL
  Filled 2024-03-21 (×9): qty 1

## 2024-03-21 MED ORDER — POTASSIUM CHLORIDE CRYS ER 20 MEQ PO TBCR
40.0000 meq | EXTENDED_RELEASE_TABLET | Freq: Once | ORAL | Status: AC
  Administered 2024-03-21: 40 meq via ORAL
  Filled 2024-03-21: qty 2

## 2024-03-21 MED ORDER — ONDANSETRON HCL 4 MG PO TABS: 4.0000 mg | ORAL_TABLET | Freq: Four times a day (QID) | ORAL | Status: DC | PRN

## 2024-03-21 MED ORDER — ONDANSETRON HCL 4 MG/2ML IJ SOLN: 4.0000 mg | Freq: Four times a day (QID) | INTRAMUSCULAR | Status: DC | PRN

## 2024-03-21 MED ORDER — APIXABAN 5 MG PO TABS
5.0000 mg | ORAL_TABLET | Freq: Two times a day (BID) | ORAL | Status: DC
Start: 2024-03-21 — End: 2024-03-30
  Administered 2024-03-21 – 2024-03-30 (×18): 5 mg via ORAL
  Filled 2024-03-21 (×19): qty 1

## 2024-03-21 MED ORDER — ACETAMINOPHEN 325 MG PO TABS: 650.0000 mg | ORAL_TABLET | Freq: Four times a day (QID) | ORAL | Status: DC | PRN

## 2024-03-21 MED ORDER — ASPIRIN 81 MG PO TBEC
81.0000 mg | DELAYED_RELEASE_TABLET | Freq: Every day | ORAL | Status: DC
Start: 2024-03-21 — End: 2024-03-22
  Administered 2024-03-21 – 2024-03-22 (×2): 81 mg via ORAL
  Filled 2024-03-21 (×2): qty 1

## 2024-03-21 MED ORDER — ATORVASTATIN CALCIUM 20 MG PO TABS
40.0000 mg | ORAL_TABLET | Freq: Every day | ORAL | Status: DC
  Administered 2024-03-22 – 2024-03-30 (×9): 40 mg via ORAL
  Filled 2024-03-21 (×9): qty 2

## 2024-03-21 MED ORDER — MEXILETINE HCL 150 MG PO CAPS
150.0000 mg | ORAL_CAPSULE | Freq: Two times a day (BID) | ORAL | Status: DC
  Administered 2024-03-21 – 2024-03-30 (×18): 150 mg via ORAL
  Filled 2024-03-21 (×21): qty 1

## 2024-03-21 MED ORDER — AMIODARONE HCL 200 MG PO TABS
200.0000 mg | ORAL_TABLET | Freq: Two times a day (BID) | ORAL | Status: DC
  Administered 2024-03-21 – 2024-03-30 (×18): 200 mg via ORAL
  Filled 2024-03-21 (×18): qty 1

## 2024-03-21 MED ORDER — SODIUM CHLORIDE 0.9% FLUSH
3.0000 mL | Freq: Two times a day (BID) | INTRAVENOUS | Status: DC
  Administered 2024-03-21 – 2024-03-30 (×19): 3 mL via INTRAVENOUS

## 2024-03-21 NOTE — Telephone Encounter (Signed)
 Called patient to go over results,   Patient reports that he is currently in the ER at [facility] due to hypokalemia, and syncopal event last night. Patient would like to make Brittainy aware- advised him that I have forwarded message over to her.   Advised patient to call back to office with any issues, questions, or concerns. Patient verbalized understanding.

## 2024-03-21 NOTE — Assessment & Plan Note (Addendum)
 Status post ICD placement in the setting of AV block Formally interrogated in the ER with noted shock therapy at 325 this morning as well as antitachycardia pacing  Cardiology consultation as appropriate

## 2024-03-21 NOTE — ED Provider Notes (Signed)
 [facility] Provider Note    Event Date/Time   First MD Initiated Contact with Patient 03/21/24 0244     (approximate)   History   Chief Complaint Loss of Consciousness and Fall   HPI  Bradley Powell is a 43 y.o. male with past medical history of CHF, atrial fibrillation on Eliquis , ventricular tachycardia, and CKD who presents to the ED complaining of syncope.  Patient reports that he was getting some water in the kitchen when he began to feel lightheaded and dizzy, like he was going to pass out.  He states it felt like his pacemaker had kicked in but he denies feeling a shock like defibrillation.  He attempted to make it back to the couch, but lost consciousness and fell to the floor, striking his head.  When he woke up, he had some difficulty breathing initially that has since resolved.  He denies any pain in his chest.     Physical Exam   Triage Vital Signs: ED Triage Vitals  Encounter Vitals Group     BP 03/21/24 0251 102/67     Systolic BP Percentile --      Diastolic BP Percentile --      Pulse Rate 03/21/24 0251 70     Resp 03/21/24 0251 17     Temp 03/21/24 0251 98 F (36.7 C)     Temp Source 03/21/24 0251 Oral     SpO2 03/21/24 0251 100 %     Weight 03/21/24 0252 240 lb (108.9 kg)     Height 03/21/24 0252 5\' 8"  (1.727 m)     Head Circumference --      Peak Flow --      Pain Score 03/21/24 0252 7     Pain Loc --      Pain Education --      Exclude from Growth Chart --     Most recent vital signs: Vitals:   03/21/24 0615 03/21/24 0645  BP: 98/78 102/89  Pulse: 72 77  Resp: (!) 31 (!) 25  Temp:    SpO2: 100% 95%    Constitutional: Alert and oriented. Eyes: Conjunctivae are normal. Head: Laceration to right lower lip that does not cross the vermilion border. Neck: Midline cervical spine tenderness to palpation. Nose: No congestion/rhinnorhea. Mouth/Throat: Mucous membranes are moist.  Cardiovascular: Normal rate, regular  rhythm. Grossly normal heart sounds.  2+ radial pulses bilaterally. Respiratory: Normal respiratory effort.  No retractions. Lungs CTAB. Gastrointestinal: Soft and nontender. No distention. Musculoskeletal: No lower extremity tenderness nor edema.  Neurologic:  Normal speech and language. No gross focal neurologic deficits are appreciated.    ED Results / Procedures / Treatments   Labs (all labs ordered are listed, but only abnormal results are displayed) Labs Reviewed  CBC WITH DIFFERENTIAL/PLATELET - Abnormal; Notable for the following components:      Result Value   MCV 77.6 (*)    MCH 24.0 (*)    RDW 18.2 (*)    Lymphs Abs 0.4 (*)    All other components within normal limits  BASIC METABOLIC PANEL WITH GFR - Abnormal; Notable for the following components:   Sodium 134 (*)    Potassium 3.1 (*)    Chloride 95 (*)    Glucose, Bld 107 (*)    BUN 39 (*)    Creatinine, Ser 1.99 (*)    GFR, Estimated 43 (*)    All other components within normal limits  TROPONIN I (HIGH SENSITIVITY) -  Abnormal; Notable for the following components:   Troponin I (High Sensitivity) 71 (*)    All other components within normal limits  MAGNESIUM   TROPONIN I (HIGH SENSITIVITY)     EKG  ED ECG REPORT I, Twilla Galea, the attending physician, personally viewed and interpreted this ECG.   Date: 03/21/2024  EKG Time: 2:56  Rate: 64  Rhythm: Sinus rhythm vs AV dissociation with junctional escape  Axis: Normal  Intervals:left posterior fascicular block  ST&T Change: None  RADIOLOGY Chest x-ray reviewed and interpreted by me with no infiltrate, edema, or effusion.  PROCEDURES:  Critical Care performed: No  Procedures   MEDICATIONS ORDERED IN ED: Medications  potassium chloride  SA (KLOR-CON  M) CR tablet 40 mEq (40 mEq Oral Given 03/21/24 1610)     IMPRESSION / MDM / ASSESSMENT AND PLAN / ED COURSE  I reviewed the triage vital signs and the nursing notes.                               43 y.o. male with past medical history of recurrent VTE, CHF, CKD, and atrial fibrillation on Eliquis  who presents to the ED following syncopal episode where he fell and hit his head.  Patient's presentation is most consistent with acute presentation with potential threat to life or bodily function.  Differential diagnosis includes, but is not limited to, arrhythmia, ACS, CHF exacerbation, electrolyte abnormality, traumatic injury.  Patient nontoxic-appearing and in no acute distress, vital signs are unremarkable.  EKG shows normal sinus rhythm versus A-V dissociation with junctional rhythm, will observe on cardiac monitor and interrogate his pacemaker.  Lab results are pending at this time, no ongoing chest pain or shortness of breath.  We will check CT head, cervical spine, and maxillofacial.  CT head, cervical spine, and maxillofacial are negative for acute process.  Small laceration to his right lower lip does not appear to be in need of repair.  Labs with mild hypokalemia, which we will replete, but no hypomagnesemia noted.  Renal function comparable to previous, no significant anemia or leukocytosis noted.  Patient does have elevation in troponin slightly above his prior baseline, will trend but no symptoms to suggest ACS at this time.  Results of pacemaker interrogation are pending, case discussed with hospitalist for admission due to concern for recurrent VT causing syncope.      FINAL CLINICAL IMPRESSION(S) / ED DIAGNOSES   Final diagnoses:  Syncope, unspecified syncope type  Hypokalemia     Rx / DC Orders   ED Discharge Orders     None        Note:  This document was prepared using Dragon voice recognition software and may include unintentional dictation errors.   Twilla Galea, MD 03/21/24 [phone]

## 2024-03-21 NOTE — Assessment & Plan Note (Signed)
 2D echo 11/03/2023 with EF 35 to 40% Appears euvolemic at present Monitor

## 2024-03-21 NOTE — Assessment & Plan Note (Signed)
 Troponin 70s in the setting of syncopal event No active chest pain Suspect minimal to mild demand ischemia in setting of syncope, recurrent V. tach EKG grossly stable Trend troponin Monitor

## 2024-03-21 NOTE — Assessment & Plan Note (Signed)
 Recurrent syncopal event in setting of cardiac pacemaker and symptomatic hypokalemia Noted admission on the cardiology EP service earlier this month with similar issues including recurrent V. tach and hypokalemia Potassium 3.1 today Pending repletion Discussed importance of regular potassium uptake Nonfocal neuroexam Consult cardiology as appropriate Monitor

## 2024-03-21 NOTE — ED Triage Notes (Signed)
 Patient arrives by [facility] from home after a fall.  He was standing in the kitchen when he became dizzy and sweaty.  He says that this has happened recently after his potassium got really low.  Patient did experience LOC and fell to the floor.  He has a small laceration under his lower lip.  Patient is taking eliquis .  He believes his heart rate got too low, and his pacemaker began firing before he passed out.

## 2024-03-21 NOTE — Telephone Encounter (Signed)
-----   Message from [place] sent at 03/20/2024  8:16 PM EDT ----- Let patient know that labs are stable. His current KCl regimen is keeping his K level up

## 2024-03-21 NOTE — ED Notes (Signed)
 Patient given grape juice.

## 2024-03-21 NOTE — H&P (Signed)
 History and Physical    Patient: Bradley Powell ZOX:[id number] DOB: 12/26/1980 DOA: 03/21/2024 DOS: the patient was seen and examined on 03/21/2024 PCP: Bradley Star, MD  Patient coming from: Home  Chief Complaint:  Chief Complaint  Patient presents with   Loss of Consciousness   Fall   HPI: Bradley Powell is a 43 y.o. male with medical history significant of recurrent VT, chronic combined CHF, AFL, CKD, issues with non-compliance and  hypokalemia presenting with syncope and hypokalemia.  Patient reports getting symptoms to drink overnight when patient felt generalized malaise and weakness.  Had a witnessed syncopal episode at home.  Patient denies any chest pain or nausea prior to the event.  Noted baseline history of similar episodes associated with hypokalemia as well as recurrent V. tach.  Patient with ICD in place.  Patient states he did feel like it possibly discharge.  Noted recent admission May 1 to May 3 on the cardiology EP service with issues concerning for ICD shocking as well as V. Tach and hypokalemia.  Symptoms felt to be predominantly associated with hypokalemia.  Had repletion with resolution of symptoms.  Patient states he had a recheck of his potassium yesterday with a potassium of 5.1.  Patient states he only took half his dose later in the day above his regular time.  Subsequently had episode of syncope.  No focal hemiparesis or confusion.  Patient reports compliance with medication regimen at present including apixaban , torsemide  and spironolactone . Presented to the ER afebrile, blood pressures 80s to 100s.  Satting well on room air.  White count 8.2, hemoglobin 13.4, platelets 256.  Troponin 70s.  Potassium 3.1.  Creatinine 1.99.  CT head, CT C-spine within normal limits.  Chest x-ray with mild vascular congestion.  Pacemaker interrogation with noted shock therapy at 325 today.  Also with antitachycardia pacing. Review of Systems: As mentioned in the history of present  illness. All other systems reviewed and are negative. Past Medical History:  Diagnosis Date   Atrial flutter (HCC)    a. s/p ablation 03/2018.   Chronic combined systolic and diastolic CHF (congestive heart failure) (HCC)    History of esophagogastroduodenoscopy (EGD)    Morbid obesity (HCC)    NICM (nonischemic cardiomyopathy) (HCC)    OSA on CPAP    mild OSA with an AHI of 7.7/hr on auto CPAP   PVC's (premature ventricular contractions)    Past Surgical History:  Procedure Laterality Date   A-FLUTTER ABLATION N/A 04/06/2018   Procedure: A-FLUTTER ABLATION;  Surgeon: Tammie Fall, MD;  Location: [facility] INVASIVE CV LAB;  Service: Cardiovascular;  Laterality: N/A;   BIOPSY  07/11/2023   Procedure: BIOPSY;  Surgeon: Felecia Hopper, MD;  Location: [facility] ENDOSCOPY;  Service: Gastroenterology;;   ESOPHAGEAL BRUSHING  06/19/2020   Procedure: ESOPHAGEAL BRUSHING;  Surgeon: Baldo Bonds, MD;  Location: [facility] ENDOSCOPY;  Service: Endoscopy;;   ESOPHAGOGASTRODUODENOSCOPY (EGD) WITH PROPOFOL  Left 06/19/2020   Procedure: ESOPHAGOGASTRODUODENOSCOPY (EGD) WITH PROPOFOL ;  Surgeon: Baldo Bonds, MD;  Location: [facility] ENDOSCOPY;  Service: Endoscopy;  Laterality: Left;   ESOPHAGOGASTRODUODENOSCOPY (EGD) WITH PROPOFOL  N/A 07/11/2023   Procedure: ESOPHAGOGASTRODUODENOSCOPY (EGD) WITH PROPOFOL ;  Surgeon: Felecia Hopper, MD;  Location: [facility] ENDOSCOPY;  Service: Gastroenterology;  Laterality: N/A;   ICD IMPLANT N/A 02/22/2022   Procedure: ICD IMPLANT;  Surgeon: Tammie Fall, MD;  Location: [facility] INVASIVE CV LAB;  Service: Cardiovascular;  Laterality: N/A;   RIGHT HEART CATH N/A 12/20/2019   Procedure: RIGHT HEART CATH;  Surgeon: Jules Oar  R, MD;  Location: [facility] INVASIVE CV LAB;  Service: Cardiovascular;  Laterality: N/A;   RIGHT/LEFT HEART CATH AND CORONARY ANGIOGRAPHY N/A 03/01/2017   Procedure: Right/Left Heart Cath and Coronary Angiography;  Surgeon: Mardell Shade, MD;  Location: [facility] INVASIVE CV  LAB;  Service: Cardiovascular;  Laterality: N/A;   Social History:  reports that he has never smoked. He has never used smokeless tobacco. He reports current alcohol use. He reports that he does not use drugs.  Allergies  Allergen Reactions   Entresto  [Sacubitril -Valsartan ] Nausea And Vomiting and Other (See Comments)    Lightheaded, fainting    Family History  Problem Relation Age of Onset   CVA Mother        pacemaker   Multiple sclerosis Mother    Seizures Mother    Hypertension Father    Gout Father     Prior to Admission medications   Medication Sig Start Date End Date Taking? Authorizing Provider  allopurinol  (ZYLOPRIM ) 100 MG tablet Take 1 tablet (100 mg total) by mouth daily. Please contact PCP for further refills 10/24/23   Deforest Fast, MD  amiodarone  (PACERONE ) 200 MG tablet Take 1 tablet (200 mg total) by mouth daily. Patient taking differently: Take 200 mg by mouth 2 (two) times daily. 01/28/24     apixaban  (ELIQUIS ) 5 MG TABS tablet TAKE 1 TABLET BY MOUTH TWICE A DAY 08/17/23   Bensimhon, Rheta Celestine, MD  atorvastatin  (LIPITOR) 40 MG tablet Take 1 tablet (40 mg total) by mouth daily. 03/07/24   Ruddy Corral M, PA-C  diclofenac  Sodium (VOLTAREN ) 1 % GEL Apply 1 Application topically daily as needed (pain).    [provider]  empagliflozin  (JARDIANCE ) 10 MG TABS tablet Take 1 tablet (10 mg total) by mouth daily. 12/28/23   Bensimhon, Rheta Celestine, MD  esomeprazole  (NEXIUM ) 40 MG capsule Take 1 capsule (40 mg total) by mouth 2 (two) times daily. 02/23/24   Brahmbhatt, Manuel Sell, MD  levothyroxine  (SYNTHROID ) 50 MCG tablet TAKE 1 TABLET (50 MCG TOTAL) BY MOUTH DAILY BEFORE BREAKFAST. TAKE 1 HOUR BEFORE BREAKFAST Patient taking differently: Take 50 mcg by mouth daily before breakfast. 10/17/23   Milford, Arlice Bene, FNP  metolazone  (ZAROXOLYN ) 5 MG tablet Take 1 tablet (5 mg total) by mouth 2 (two) times a week every Sunday and Wednesday. 12/25/23   Bensimhon, Rheta Celestine, MD   mexiletine (MEXITIL ) 150 MG capsule Take 1 capsule (150 mg total) by mouth every 12 (twelve) hours. 08/09/23   Milford, Arlice Bene, FNP  polyethylene glycol powder (GLYCOLAX /MIRALAX ) 17 GM/SCOOP powder Take 1 CAPFUL (17 g) by mouth daily as needed. Patient taking differently: Take 17 g by mouth daily as needed for mild constipation or moderate constipation. 10/24/23   Deforest Fast, MD  potassium chloride  SA (KLOR-CON  M) 20 MEQ tablet Take 4 tablets (80 mEq total) by mouth 2 (two) times daily. And 2 tablets at night Patient taking differently: Take 60-80 mEq by mouth See admin instructions. Take 80 meq in the morning and at bedtime. Take 60 meq in the afternoon 03/11/24   Ruddy Corral M, PA-C  spironolactone  (ALDACTONE ) 50 MG tablet Take 1 tablet (50 mg total) by mouth daily. Patient not taking: Reported on 03/14/2024 02/22/24   Tylene Galla, PA-C  torsemide  (DEMADEX ) 20 MG tablet Take 4 tablets (80mg )  in the morning, and 3 tablets (60mg ) in the afternoon 11/30/23   Deforest Fast, MD    Physical Exam: Vitals:   03/21/24 5638 03/21/24 7564 03/21/24 3329  03/21/24 0735  BP: 98/78 102/89 104/72 103/71  Pulse: 72 77 72 72  Resp: (!) 31 (!) 25 (!) 36 18  Temp:    (!) 97.3 F (36.3 C)  TempSrc:    Oral  SpO2: 100% 95% 96% 97%  Weight:      Height:       Physical Exam Constitutional:      Appearance: He is obese.  HENT:     Head: Normocephalic and atraumatic.     Nose: Nose normal.     Mouth/Throat:     Mouth: Mucous membranes are moist.  Eyes:     Pupils: Pupils are equal, round, and reactive to light.  Cardiovascular:     Rate and Rhythm: Normal rate and regular rhythm.  Pulmonary:     Effort: Pulmonary effort is normal.  Abdominal:     General: Bowel sounds are normal.  Musculoskeletal:        General: Normal range of motion.  Skin:    General: Skin is warm.  Neurological:     General: No focal deficit present.  Psychiatric:        Mood and Affect: Mood  normal.     Data Reviewed:  There are no new results to review at this time.  CT Cervical Spine Wo Contrast CLINICAL DATA:  Facial trauma, blunt; Head trauma, coagulopathy (Age 57-64y); Neck trauma, midline tenderness (Age 59-64y)  EXAM: CT HEAD WITHOUT CONTRAST  CT MAXILLOFACIAL WITHOUT CONTRAST  CT CERVICAL SPINE WITHOUT CONTRAST  TECHNIQUE: Multidetector CT imaging of the head, cervical spine, and maxillofacial structures were performed using the standard protocol without intravenous contrast. Multiplanar CT image reconstructions of the cervical spine and maxillofacial structures were also generated.  RADIATION DOSE REDUCTION: This exam was performed according to the departmental dose-optimization program which includes automated exposure control, adjustment of the mA and/or kV according to patient size and/or use of iterative reconstruction technique.  COMPARISON:  None Available.  FINDINGS: CT HEAD FINDINGS  Brain: No evidence of acute infarction, hemorrhage, hydrocephalus, extra-axial collection or mass lesion/mass effect.  Vascular: No hyperdense vessel.  Skull: No acute fracture.  Other: No mastoid effusions.  CT MAXILLOFACIAL FINDINGS  Osseous: No fracture or mandibular dislocation. No destructive process.  Orbits: Negative. No traumatic or inflammatory finding.  Sinuses: Clear.  Soft tissues: Negative.  CT CERVICAL SPINE FINDINGS  Alignment: Reversal of the normal cervical lordosis. No substantial sagittal subluxation.  Skull base and vertebrae: No acute fracture. Vertebral body heights are maintained.  Soft tissues and spinal canal: No prevertebral fluid or swelling. No visible canal hematoma.  Disc levels: No significant bony degenerative change.  Upper chest: Visualized lung apices are clear.  IMPRESSION: 1. No acute intracranial abnormality.  No facial fracture. 2. No evidence of acute fracture or traumatic malalignment in  the cervical spine.  Electronically Signed   By: Stevenson Elbe M.D.   On: 03/21/2024 03:51 CT Head Wo Contrast CLINICAL DATA:  Facial trauma, blunt; Head trauma, coagulopathy (Age 85-64y); Neck trauma, midline tenderness (Age 37-64y)  EXAM: CT HEAD WITHOUT CONTRAST  CT MAXILLOFACIAL WITHOUT CONTRAST  CT CERVICAL SPINE WITHOUT CONTRAST  TECHNIQUE: Multidetector CT imaging of the head, cervical spine, and maxillofacial structures were performed using the standard protocol without intravenous contrast. Multiplanar CT image reconstructions of the cervical spine and maxillofacial structures were also generated.  RADIATION DOSE REDUCTION: This exam was performed according to the departmental dose-optimization program which includes automated exposure control, adjustment of the mA and/or kV according  to patient size and/or use of iterative reconstruction technique.  COMPARISON:  None Available.  FINDINGS: CT HEAD FINDINGS  Brain: No evidence of acute infarction, hemorrhage, hydrocephalus, extra-axial collection or mass lesion/mass effect.  Vascular: No hyperdense vessel.  Skull: No acute fracture.  Other: No mastoid effusions.  CT MAXILLOFACIAL FINDINGS  Osseous: No fracture or mandibular dislocation. No destructive process.  Orbits: Negative. No traumatic or inflammatory finding.  Sinuses: Clear.  Soft tissues: Negative.  CT CERVICAL SPINE FINDINGS  Alignment: Reversal of the normal cervical lordosis. No substantial sagittal subluxation.  Skull base and vertebrae: No acute fracture. Vertebral body heights are maintained.  Soft tissues and spinal canal: No prevertebral fluid or swelling. No visible canal hematoma.  Disc levels: No significant bony degenerative change.  Upper chest: Visualized lung apices are clear.  IMPRESSION: 1. No acute intracranial abnormality.  No facial fracture. 2. No evidence of acute fracture or traumatic malalignment in  the cervical spine.  Electronically Signed   By: Stevenson Elbe M.D.   On: 03/21/2024 03:51 CT Maxillofacial WO CM CLINICAL DATA:  Facial trauma, blunt; Head trauma, coagulopathy (Age 65-64y); Neck trauma, midline tenderness (Age 37-64y)  EXAM: CT HEAD WITHOUT CONTRAST  CT MAXILLOFACIAL WITHOUT CONTRAST  CT CERVICAL SPINE WITHOUT CONTRAST  TECHNIQUE: Multidetector CT imaging of the head, cervical spine, and maxillofacial structures were performed using the standard protocol without intravenous contrast. Multiplanar CT image reconstructions of the cervical spine and maxillofacial structures were also generated.  RADIATION DOSE REDUCTION: This exam was performed according to the departmental dose-optimization program which includes automated exposure control, adjustment of the mA and/or kV according to patient size and/or use of iterative reconstruction technique.  COMPARISON:  None Available.  FINDINGS: CT HEAD FINDINGS  Brain: No evidence of acute infarction, hemorrhage, hydrocephalus, extra-axial collection or mass lesion/mass effect.  Vascular: No hyperdense vessel.  Skull: No acute fracture.  Other: No mastoid effusions.  CT MAXILLOFACIAL FINDINGS  Osseous: No fracture or mandibular dislocation. No destructive process.  Orbits: Negative. No traumatic or inflammatory finding.  Sinuses: Clear.  Soft tissues: Negative.  CT CERVICAL SPINE FINDINGS  Alignment: Reversal of the normal cervical lordosis. No substantial sagittal subluxation.  Skull base and vertebrae: No acute fracture. Vertebral body heights are maintained.  Soft tissues and spinal canal: No prevertebral fluid or swelling. No visible canal hematoma.  Disc levels: No significant bony degenerative change.  Upper chest: Visualized lung apices are clear.  IMPRESSION: 1. No acute intracranial abnormality.  No facial fracture. 2. No evidence of acute fracture or traumatic malalignment in  the cervical spine.  Electronically Signed   By: Stevenson Elbe M.D.   On: 03/21/2024 03:51 DG Chest Portable 1 View CLINICAL DATA:  Shortness of breath  EXAM: PORTABLE CHEST 1 VIEW  COMPARISON:  03/14/2024  FINDINGS: Cardiac shadow is enlarged. Defibrillator is again noted. Lungs are well aerated bilaterally. Mild central vascular congestion is seen without significant edema. No infiltrate is noted.  IMPRESSION: Mild vascular congestion without edema.  Electronically Signed   By: Violeta Grey M.D.   On: 03/21/2024 03:49  Lab Results  Component Value Date   WBC 8.2 03/21/2024   HGB 13.4 03/21/2024   HCT 43.4 03/21/2024   MCV 77.6 (L) 03/21/2024   PLT 256 03/21/2024   Last metabolic panel Lab Results  Component Value Date   GLUCOSE 107 (H) 03/21/2024   NA 134 (L) 03/21/2024   K 3.1 (L) 03/21/2024   CL 95 (L) 03/21/2024   CO2  26 03/21/2024   BUN 39 (H) 03/21/2024   CREATININE 1.99 (H) 03/21/2024   GFRNONAA 43 (L) 03/21/2024   CALCIUM  8.9 03/21/2024   PHOS 3.4 11/25/2023   PROT 6.4 (L) 02/17/2024   ALBUMIN 2.8 (L) 02/17/2024   LABGLOB 3.2 07/22/2020   BILITOT 3.0 (H) 02/17/2024   ALKPHOS 103 02/17/2024   AST 45 (H) 02/17/2024   ALT 45 (H) 02/17/2024   ANIONGAP 13 03/21/2024     Assessment and Plan: Syncope Recurrent syncopal event in setting of cardiac pacemaker and symptomatic hypokalemia Noted admission on the cardiology EP service earlier this month with similar issues including recurrent V. tach and hypokalemia Potassium 3.1 today Pending repletion Discussed importance of regular potassium uptake Nonfocal neuroexam Consult cardiology as appropriate Monitor  Atrial fibrillation, chronic (HCC) Rate controlled at present Continue home amiodarone  and Eliquis  Monitor  CKD (chronic kidney disease), stage III (HCC) Creatinine 2 today with GFR in the 40s Appears to be near baseline Will formally consult nephrology in the setting recurrent  hypokalemia Monitor  Elevated troponin Troponin 70s in the setting of syncopal event No active chest pain Suspect minimal to mild demand ischemia in setting of syncope, recurrent V. tach EKG grossly stable Trend troponin Monitor  ICD (implantable cardioverter-defibrillator) in place Status post ICD placement in the setting of AV block Formally interrogated in the ER with noted shock therapy at 325 this morning as well as antitachycardia pacing  Cardiology consultation as appropriate   NICM (nonischemic cardiomyopathy) (HCC) 2D echo 11/03/2023 with EF 35 to 40% Appears euvolemic at present Monitor      Advance Care Planning:   Code Status: Full Code   Consults: Nephrology  Family Communication: No family at the bedside   Severity of Illness: The appropriate patient status for this patient is OBSERVATION. Observation status is judged to be reasonable and necessary in order to provide the required intensity of service to ensure the patient's safety. The patient's presenting symptoms, physical exam findings, and initial radiographic and laboratory data in the context of their medical condition is felt to place them at decreased risk for further clinical deterioration. Furthermore, it is anticipated that the patient will be medically stable for discharge from the hospital within 2 midnights of admission.   Author: Corrinne Din, MD 03/21/2024 8:02 AM  For on call review [email or web address].

## 2024-03-21 NOTE — Assessment & Plan Note (Addendum)
 Creatinine 2 today with GFR in the 40s Appears to be near baseline Will formally consult nephrology in the setting recurrent hypokalemia Monitor

## 2024-03-21 NOTE — Assessment & Plan Note (Signed)
 Rate controlled at present Continue home amiodarone  and Eliquis  Monitor

## 2024-03-21 NOTE — Significant Event (Signed)
       CROSS COVER NOTE  NAME: Bradley Powell MRN: [id number] DOB : 1981-08-24 ATTENDING PHYSICIAN: Corrinne Din, MD    Date of Service   03/21/2024   HPI/Events of Note   Nurse reports non sustained elevated heart rates to 150  Extensive cardiac history including tachyarrhythmias and v tach Followed by HF and EP   Interventions   Assessment/Plan: Bedside patient without acute distress. EKG accelerated junctional rhythm no STE . No change from prior EKGs today Telemonitor appears to be misreading rate on rhythm. Monitoring lead changed.  Home mexitil  ordered       Kip Peon NP [facility] Cross Cover 7pm-7am - check amion for availability Pager [phone]

## 2024-03-22 ENCOUNTER — Ambulatory Visit: Admitting: Student

## 2024-03-22 DIAGNOSIS — E876 Hypokalemia: Secondary | ICD-10-CM | POA: Diagnosis not present

## 2024-03-22 DIAGNOSIS — Z4502 Encounter for adjustment and management of automatic implantable cardiac defibrillator: Secondary | ICD-10-CM

## 2024-03-22 DIAGNOSIS — N1832 Chronic kidney disease, stage 3b: Secondary | ICD-10-CM | POA: Diagnosis not present

## 2024-03-22 DIAGNOSIS — I998 Other disorder of circulatory system: Secondary | ICD-10-CM | POA: Diagnosis not present

## 2024-03-22 DIAGNOSIS — I472 Ventricular tachycardia, unspecified: Secondary | ICD-10-CM | POA: Diagnosis not present

## 2024-03-22 LAB — COMPREHENSIVE METABOLIC PANEL WITH GFR
ALT: 31 U/L (ref 0–44)
AST: 24 U/L (ref 15–41)
Albumin: 2.9 g/dL — ABNORMAL LOW (ref 3.5–5.0)
Alkaline Phosphatase: 102 U/L (ref 38–126)
Anion gap: 8 (ref 5–15)
BUN: 36 mg/dL — ABNORMAL HIGH (ref 6–20)
CO2: 24 mmol/L (ref 22–32)
Calcium: 8.9 mg/dL (ref 8.9–10.3)
Chloride: 101 mmol/L (ref 98–111)
Creatinine, Ser: 1.84 mg/dL — ABNORMAL HIGH (ref 0.61–1.24)
GFR, Estimated: 47 mL/min — ABNORMAL LOW (ref >60)
Glucose, Bld: 106 mg/dL — ABNORMAL HIGH (ref 70–99)
Potassium: 3.3 mmol/L — ABNORMAL LOW (ref 3.5–5.1)
Sodium: 133 mmol/L — ABNORMAL LOW (ref 135–145)
Total Bilirubin: 1.9 mg/dL — ABNORMAL HIGH (ref 0.0–1.2)
Total Protein: 6.3 g/dL — ABNORMAL LOW (ref 6.5–8.1)

## 2024-03-22 LAB — CBC
HCT: 44.2 % (ref 39.0–52.0)
Hemoglobin: 13.4 g/dL (ref 13.0–17.0)
MCH: 23.5 pg — ABNORMAL LOW (ref 26.0–34.0)
MCHC: 30.3 g/dL (ref 30.0–36.0)
MCV: 77.5 fL — ABNORMAL LOW (ref 80.0–100.0)
Platelets: 240 10*3/uL (ref 150–400)
RBC: 5.7 MIL/uL (ref 4.22–5.81)
RDW: 18.5 % — ABNORMAL HIGH (ref 11.5–15.5)
WBC: 6 10*3/uL (ref 4.0–10.5)
nRBC: 0 % (ref 0.0–0.2)

## 2024-03-22 LAB — POTASSIUM: Potassium: 3.4 mmol/L — ABNORMAL LOW (ref 3.5–5.1)

## 2024-03-22 MED ORDER — POTASSIUM CHLORIDE CRYS ER 20 MEQ PO TBCR
40.0000 meq | EXTENDED_RELEASE_TABLET | ORAL | Status: AC
  Administered 2024-03-22 (×2): 40 meq via ORAL
  Filled 2024-03-22 (×2): qty 2

## 2024-03-22 MED ORDER — EMPAGLIFLOZIN 10 MG PO TABS
10.0000 mg | ORAL_TABLET | Freq: Every day | ORAL | Status: DC
  Administered 2024-03-22 – 2024-03-30 (×9): 10 mg via ORAL
  Filled 2024-03-22 (×9): qty 1

## 2024-03-22 MED ORDER — TORSEMIDE 20 MG PO TABS
60.0000 mg | ORAL_TABLET | Freq: Two times a day (BID) | ORAL | Status: DC
  Administered 2024-03-22 – 2024-03-25 (×7): 60 mg via ORAL
  Filled 2024-03-22 (×8): qty 3

## 2024-03-22 MED ORDER — ASPIRIN 81 MG PO CHEW
81.0000 mg | CHEWABLE_TABLET | Freq: Every day | ORAL | Status: DC
Start: 2024-03-22 — End: 2024-03-26
  Administered 2024-03-23 – 2024-03-25 (×3): 81 mg via ORAL
  Filled 2024-03-22 (×4): qty 1

## 2024-03-22 NOTE — Plan of Care (Signed)

## 2024-03-22 NOTE — Progress Notes (Addendum)
 Progress Note   Patient: Bradley Powell WUJ:[id number] DOB: 1981-06-24 DOA: 03/21/2024     0 DOS: the patient was seen and examined on 03/22/2024   Brief hospital course: From HPI "Bradley Powell is a 43 y.o. male with medical history significant of recurrent VT, chronic combined CHF, AFL, CKD, issues with non-compliance and  hypokalemia presenting with syncope and hypokalemia.  Patient reports getting symptoms to drink overnight when patient felt generalized malaise and weakness.  Had a witnessed syncopal episode at home.  Patient denies any chest pain or nausea prior to the event.  Noted baseline history of similar episodes associated with hypokalemia as well as recurrent V. tach.  Patient with ICD in place.  Patient states he did feel like it possibly discharge.  Noted recent admission May 1 to May 3 on the cardiology EP service with issues concerning for ICD shocking as well as V. Tach and hypokalemia.  Symptoms felt to be predominantly associated with hypokalemia.  Had repletion with resolution of symptoms.  Patient states he had a recheck of his potassium yesterday with a potassium of 5.1.  Patient states he only took half his dose later in the day above his regular time.  Subsequently had episode of syncope.  No focal hemiparesis or confusion.  Patient reports compliance with medication regimen at present including apixaban , torsemide  and spironolactone . Presented to the ER afebrile, blood pressures 80s to 100s.  Satting well on room air.  White count 8.2, hemoglobin 13.4, platelets 256.  Troponin 70s.  Potassium 3.1.  Creatinine 1.99.  CT head, CT C-spine within normal limits.  Chest x-ray with mild vascular congestion.  Pacemaker interrogation with noted shock therapy at 325 today.  Also with antitachycardia pacing.  "  Assessment and Plan: Syncope Recurrent syncopal event in setting of cardiac pacemaker and symptomatic hypokalemia Noted admission on the cardiology EP service earlier this  month with similar issues including recurrent V. tach and hypokalemia Potassium level low despite aggressive replacement I have discussed with cardiologist as well as nephrologist and we are thinking patient may be having underlying RTA According to cardiologist since patient is high risk of arrhythmia he cannot be discharged until his potassium level stabilizes with initiation of diuretic therapy We appreciate the input of cardiology as well as nephrology   Persistent hypokalemia despite repletion Patient still having persistent hypokalemia despite aggressive repletion therapy Continue potassium replacement and monitoring  Atrial fibrillation, chronic (HCC) Rate controlled at present Continue home amiodarone  and Eliquis  Monitor   CKD (chronic kidney disease), stage III (HCC) Creatinine 2 today with GFR in the 40s Appears to be near baseline Will formally consult nephrology in the setting recurrent hypokalemia Monitor   Elevated troponin Troponin 70s in the setting of syncopal event No chest pains Monitor on telemetry closely   ICD (implantable cardioverter-defibrillator) in place Status post ICD placement in the setting of AV block Formally interrogated in the ER with noted shock therapy at 325 this morning as well as antitachycardia pacing      NICM (nonischemic cardiomyopathy) (HCC) 2D echo 11/03/2023 with EF 35 to 40% Appears euvolemic at present Monitor    Advance Care Planning:   Code Status: Full Code    Consults: Nephrology   Family Communication: No family at the bedside      Subjective:  Patient seen and examined at bedside this morning He denies nausea vomiting chest pain or abdominal pain According to cardiologist since patient is high risk of arrhythmia and still he  cannot be discharged until his potassium level stabilizes with initiation of diuretic therapy  Physical Exam: Head: Normocephalic and atraumatic.     Nose: Nose normal.     Mouth/Throat:      Mouth: Mucous membranes are moist.  Eyes:     Pupils: Pupils are equal, round, and reactive to light.  Cardiovascular:     Rate and Rhythm: Normal rate and regular rhythm.  Pulmonary:     Effort: Pulmonary effort is normal.  Abdominal:     General: Bowel sounds are normal.  Musculoskeletal:        General: Normal range of motion.  Skin:    General: Skin is warm.  Neurological:     General: No focal deficit present.  Psychiatric:        Mood and Affect: Mood normal.   Data reviewed:  Telemetry reviewed Chest x-ray reviewed showing some mild vascular congestion Vitals:   03/22/24 0334 03/22/24 0507 03/22/24 0757 03/22/24 1310  BP: (!) 149/123  (!) 96/54 108/72  Pulse: 70  69 73  Resp: 20     Temp: 98.3 F (36.8 C)  97.8 F (36.6 C) 97.6 F (36.4 C)  TempSrc:      SpO2: 100%  100% 100%  Weight:  112.2 kg    Height:          Latest Ref Rng & Units 03/22/2024    6:53 AM 03/21/2024    4:41 AM 03/14/2024    8:19 AM  CBC  WBC 4.0 - 10.5 K/uL 6.0  8.2  6.1   Hemoglobin 13.0 - 17.0 g/dL 16.1  09.6  04.5   Hematocrit 39.0 - 52.0 % 44.2  43.4  44.5   Platelets 150 - 400 K/uL 240  256  240        Latest Ref Rng & Units 03/22/2024    2:42 PM 03/22/2024    6:53 AM 03/21/2024    4:41 AM  BMP  Glucose 70 - 99 mg/dL  409  811   BUN 6 - 20 mg/dL  36  39   Creatinine 9.14 - 1.24 mg/dL  7.82  9.56   Sodium 213 - 145 mmol/L  133  134   Potassium 3.5 - 5.1 mmol/L 3.4  3.3  3.1   Chloride 98 - 111 mmol/L  101  95   CO2 22 - 32 mmol/L  24  26   Calcium  8.9 - 10.3 mg/dL  8.9  8.9      Author: Ezzard Holms, MD 03/22/2024 5:31 PM  For on call review [email or web address].

## 2024-03-22 NOTE — Plan of Care (Signed)
  Problem: Education: Goal: Knowledge of General Education information will improve Description: Including pain rating scale, medication(s)/side effects and non-pharmacologic comfort measures Outcome: Progressing   Problem: Health Behavior/Discharge Planning: Goal: Ability to manage health-related needs will improve Outcome: Progressing   Problem: Clinical Measurements: Goal: Ability to maintain clinical measurements within normal limits will improve Outcome: Progressing Goal: Will remain free from infection Outcome: Progressing Goal: Diagnostic test results will improve Outcome: Progressing Goal: Respiratory complications will improve Outcome: Progressing Goal: Cardiovascular complication will be avoided Outcome: Progressing   Problem: Activity: Goal: Risk for activity intolerance will decrease Outcome: Progressing   Problem: Nutrition: Goal: Adequate nutrition will be maintained Outcome: Progressing   Problem: Coping: Goal: Level of anxiety will decrease Outcome: Progressing   Problem: Elimination: Goal: Will not experience complications related to bowel motility Outcome: Progressing Goal: Will not experience complications related to urinary retention Outcome: Progressing   Problem: Pain Managment: Goal: General experience of comfort will improve and/or be controlled Outcome: Progressing   Problem: Safety: Goal: Ability to remain free from injury will improve Outcome: Progressing   Problem: Skin Integrity: Goal: Risk for impaired skin integrity will decrease Outcome: Progressing   Problem: Education: Goal: Knowledge of condition and prescribed therapy will improve Outcome: Progressing   Problem: Cardiac: Goal: Will achieve and/or maintain adequate cardiac output Outcome: Progressing   Problem: Physical Regulation: Goal: Complications related to the disease process, condition or treatment will be avoided or minimized Outcome: Progressing

## 2024-03-22 NOTE — Consult Note (Signed)
 ELECTROPHYSIOLOGY CONSULT NOTE    Patient ID: Bradley Powell MRN: [id number], DOB/AGE: 1981/06/26 43 y.o.  Admit date: 03/21/2024 Date of Consult: 03/22/2024  Primary Physician: Merl Star, MD Primary Cardiologist: None  Electrophysiologist: Dr. Carolynne Citron   Referring Provider: Dr. Mariella Shore  Patient Profile: Bradley Powell is a 43 y.o. male with a history of chronic combined CHF, AFL, CKD, VT s/p ICD who is being seen today for the evaluation of syncope with ICD shock at the request of Dr. Mariella Shore.  HPI:  Bradley Powell is a 43 y.o. male with PMH as above who is well-known to EP team. He has been admitted in January, April, and earlier May for ICD shocks with VT in the setting of hypokalemia and medication non-compliance.  He has been feeling very well since recent discharge from hospital. Diligently taking medications and potassium supplement. He had HF outpatient appt Wednesday, 5/7 , with routine labs drawn. He saw that his potassium was 5.1 and was worried that it was getting too high, and so took 60mEq of PO instead of 80mEq.   Around midnight Wednesday into Thursday, he was getting ready for bed, cooking food and noticed he had a slight headache but nothing completely out of the ordinary. He then developed dizziness, which is typical for his prodrome prior to [facility] therapy. He started to walk to cough, and then next thing he remembers is waking up to his mother and father standing over him. Does not remember being shocked.     Labs in ER notable for K of 3.1 (previously 5.1 the day prior), Cr 1.99 (baseline 2-2.2), trops flat 71 > 77> 40.  Weight slightly lower. Overall , he feels well currently. He confirms he has been taking all of his medications as prescribed since discharge except for the slightly lower PO potassium the one evening. He has good appetite. Able to sleep flat in bed without PND. Denies lower extremity edema.   Labs Potassium3.3* (05/09 5009) Magnesium   2.0  (05/08 0441) Creatinine, ser  1.84* (05/09 0653) PLT  240 (05/09 0653) HGB  13.4 (05/09 0653) WBC 6.0 (05/09 0653) Troponin I (High Sensitivity)40* (05/08 2047).    Past Medical History:  Diagnosis Date   Atrial flutter (HCC)    a. s/p ablation 03/2018.   Chronic combined systolic and diastolic CHF (congestive heart failure) (HCC)    History of esophagogastroduodenoscopy (EGD)    Morbid obesity (HCC)    NICM (nonischemic cardiomyopathy) (HCC)    OSA on CPAP    mild OSA with an AHI of 7.7/hr on auto CPAP   PVC's (premature ventricular contractions)      Surgical History:  Past Surgical History:  Procedure Laterality Date   A-FLUTTER ABLATION N/A 04/06/2018   Procedure: A-FLUTTER ABLATION;  Surgeon: Tammie Fall, MD;  Location: [facility] INVASIVE CV LAB;  Service: Cardiovascular;  Laterality: N/A;   BIOPSY  07/11/2023   Procedure: BIOPSY;  Surgeon: Felecia Hopper, MD;  Location: [facility] ENDOSCOPY;  Service: Gastroenterology;;   ESOPHAGEAL BRUSHING  06/19/2020   Procedure: ESOPHAGEAL BRUSHING;  Surgeon: Baldo Bonds, MD;  Location: [facility] ENDOSCOPY;  Service: Endoscopy;;   ESOPHAGOGASTRODUODENOSCOPY (EGD) WITH PROPOFOL  Left 06/19/2020   Procedure: ESOPHAGOGASTRODUODENOSCOPY (EGD) WITH PROPOFOL ;  Surgeon: Baldo Bonds, MD;  Location: [facility] ENDOSCOPY;  Service: Endoscopy;  Laterality: Left;   ESOPHAGOGASTRODUODENOSCOPY (EGD) WITH PROPOFOL  N/A 07/11/2023   Procedure: ESOPHAGOGASTRODUODENOSCOPY (EGD) WITH PROPOFOL ;  Surgeon: Felecia Hopper, MD;  Location: [facility] ENDOSCOPY;  Service: Gastroenterology;  Laterality: N/A;  ICD IMPLANT N/A 02/22/2022   Procedure: ICD IMPLANT;  Surgeon: Tammie Fall, MD;  [facility] INVASIVE CV LAB;  Service: Cardiovascular;  Laterality: N/A;   RIGHT HEART CATH N/A 12/20/2019   Procedure: RIGHT HEART CATH;  Surgeon: Mardell Shade, MD;  Location: [facility] INVASIVE CV LAB;  Service: Cardiovascular;  Laterality: N/A;   RIGHT/LEFT HEART CATH AND CORONARY  ANGIOGRAPHY N/A 03/01/2017   Procedure: Right/Left Heart Cath and Coronary Angiography;  Surgeon: Mardell Shade, MD;  Location: [facility] INVASIVE CV LAB;  Service: Cardiovascular;  Laterality: N/A;     Medications Prior to Admission  Medication Sig Dispense Refill Last Dose/Taking   allopurinol  (ZYLOPRIM ) 100 MG tablet Take 1 tablet (100 mg total) by mouth daily. Please contact PCP for further refills   03/21/2024 at  1:00 AM   amiodarone  (PACERONE ) 200 MG tablet Take 1 tablet (200 mg total) by mouth daily. (Patient taking differently: Take 200 mg by mouth 2 (two) times daily.) 90 tablet 3 03/21/2024 at  1:00 AM   apixaban  (ELIQUIS ) 5 MG TABS tablet TAKE 1 TABLET BY MOUTH TWICE A DAY 60 tablet 11 03/21/2024 at  1:00 AM   atorvastatin  (LIPITOR) 40 MG tablet Take 1 tablet (40 mg total) by mouth daily. 30 tablet 6 03/21/2024   empagliflozin  (JARDIANCE ) 10 MG TABS tablet Take 1 tablet (10 mg total) by mouth daily. 30 tablet 11 03/20/2024 Morning   esomeprazole  (NEXIUM ) 40 MG capsule Take 1 capsule (40 mg total) by mouth 2 (two) times daily. 180 capsule 3 03/21/2024 at  1:00 AM   levothyroxine  (SYNTHROID ) 50 MCG tablet TAKE 1 TABLET (50 MCG TOTAL) BY MOUTH DAILY BEFORE BREAKFAST. TAKE 1 HOUR BEFORE BREAKFAST (Patient taking differently: Take 50 mcg by mouth daily before breakfast.) 90 tablet 3 03/20/2024 Morning   metolazone  (ZAROXOLYN ) 5 MG tablet Take 1 tablet (5 mg total) by mouth 2 (two) times a week every Sunday and Wednesday. 10 tablet 1 03/20/2024   mexiletine (MEXITIL ) 150 MG capsule Take 1 capsule (150 mg total) by mouth every 12 (twelve) hours. 60 capsule 5 03/20/2024   polyethylene glycol powder (GLYCOLAX /MIRALAX ) 17 GM/SCOOP powder Take 1 CAPFUL (17 g) by mouth daily as needed. (Patient taking differently: Take 17 g by mouth daily as needed for mild constipation or moderate constipation.) 238 g 1 Taking Differently   potassium chloride  SA (KLOR-CON  M) 20 MEQ tablet Take 4 tablets (80 mEq total) by mouth 2 (two)  times daily. And 2 tablets at night (Patient taking differently: Take 60-80 mEq by mouth See admin instructions. Take 80 meq in the morning and at bedtime. Take 60 meq in the afternoon) 180 tablet 3 03/21/2024 Morning   spironolactone  (ALDACTONE ) 50 MG tablet Take 1 tablet (50 mg total) by mouth daily. 90 tablet 3 03/20/2024 Morning   torsemide  (DEMADEX ) 20 MG tablet Take 4 tablets (80mg )  in the morning, and 3 tablets (60mg ) in the afternoon 180 tablet 2 03/20/2024 Noon   diclofenac  Sodium (VOLTAREN ) 1 % GEL Apply 1 Application topically daily as needed (pain).       Inpatient Medications:   allopurinol   100 mg Oral Daily   amiodarone   200 mg Oral BID   apixaban   5 mg Oral BID   aspirin   81 mg Oral Daily   atorvastatin   40 mg Oral Daily   mexiletine  150 mg Oral Q12H   pantoprazole   40 mg Oral Daily   potassium chloride   40 mEq Oral Q4H   sodium chloride   flush  3 mL Intravenous Q12H    Allergies:  Allergies  Allergen Reactions   Entresto  [Sacubitril -Valsartan ] Nausea And Vomiting and Other (See Comments)    Lightheaded, fainting    Family History  Problem Relation Age of Onset   CVA Mother        pacemaker   Multiple sclerosis Mother    Seizures Mother    Hypertension Father    Gout Father      Physical Exam: Vitals:   03/22/24 0000 03/22/24 0334 03/22/24 0507 03/22/24 0757  BP: 100/70 (!) 149/123  (!) 96/54  Pulse: 87 70  69  Resp: 20 20    Temp: 97.8 F (36.6 C) 98.3 F (36.8 C)  97.8 F (36.6 C)  TempSrc: Oral     SpO2: 98% 100%  100%  Weight: 112.2 kg  112.2 kg   Height: 5\' 8"  (1.727 m)       GEN- NAD, A&O x 3, normal affect HEENT: Normocephalic, atraumatic Lungs- CTAB, Normal effort.  Heart- Regular rate and rhythm, No M/G/R.  GI- Soft, NT, ND.  Extremities- No clubbing, cyanosis. Trace bilateral lower extremity edema   Radiology/Studies: CT Head Wo Contrast Result Date: 03/21/2024 CLINICAL DATA:  Facial trauma, blunt; Head trauma, coagulopathy (Age 34-64y);  Neck trauma, midline tenderness (Age 70-64y) EXAM: CT HEAD WITHOUT CONTRAST CT MAXILLOFACIAL WITHOUT CONTRAST CT CERVICAL SPINE WITHOUT CONTRAST TECHNIQUE: Multidetector CT imaging of the head, cervical spine, and maxillofacial structures were performed using the standard protocol without intravenous contrast. Multiplanar CT image reconstructions of the cervical spine and maxillofacial structures were also generated. RADIATION DOSE REDUCTION: This exam was performed according to the departmental dose-optimization program which includes automated exposure control, adjustment of the mA and/or kV according to patient size and/or use of iterative reconstruction technique. COMPARISON:  None Available. FINDINGS: CT HEAD FINDINGS Brain: No evidence of acute infarction, hemorrhage, hydrocephalus, extra-axial collection or mass lesion/mass effect. Vascular: No hyperdense vessel. Skull: No acute fracture. Other: No mastoid effusions. CT MAXILLOFACIAL FINDINGS Osseous: No fracture or mandibular dislocation. No destructive process. Orbits: Negative. No traumatic or inflammatory finding. Sinuses: Clear. Soft tissues: Negative. CT CERVICAL SPINE FINDINGS Alignment: Reversal of the normal cervical lordosis. No substantial sagittal subluxation. Skull base and vertebrae: No acute fracture. Vertebral body heights are maintained. Soft tissues and spinal canal: No prevertebral fluid or swelling. No visible canal hematoma. Disc levels: No significant bony degenerative change. Upper chest: Visualized lung apices are clear. IMPRESSION: 1. No acute intracranial abnormality.  No facial fracture. 2. No evidence of acute fracture or traumatic malalignment in the cervical spine. Electronically Signed   By: Stevenson Elbe M.D.   On: 03/21/2024 03:51   CT Maxillofacial WO CM Result Date: 03/21/2024 CLINICAL DATA:  Facial trauma, blunt; Head trauma, coagulopathy (Age 54-64y); Neck trauma, midline tenderness (Age 51-64y) EXAM: CT HEAD WITHOUT  CONTRAST CT MAXILLOFACIAL WITHOUT CONTRAST CT CERVICAL SPINE WITHOUT CONTRAST TECHNIQUE: Multidetector CT imaging of the head, cervical spine, and maxillofacial structures were performed using the standard protocol without intravenous contrast. Multiplanar CT image reconstructions of the cervical spine and maxillofacial structures were also generated. RADIATION DOSE REDUCTION: This exam was performed according to the departmental dose-optimization program which includes automated exposure control, adjustment of the mA and/or kV according to patient size and/or use of iterative reconstruction technique. COMPARISON:  None Available. FINDINGS: CT HEAD FINDINGS Brain: No evidence of acute infarction, hemorrhage, hydrocephalus, extra-axial collection or mass lesion/mass effect. Vascular: No hyperdense vessel. Skull: No acute fracture. Other: No mastoid  effusions. CT MAXILLOFACIAL FINDINGS Osseous: No fracture or mandibular dislocation. No destructive process. Orbits: Negative. No traumatic or inflammatory finding. Sinuses: Clear. Soft tissues: Negative. CT CERVICAL SPINE FINDINGS Alignment: Reversal of the normal cervical lordosis. No substantial sagittal subluxation. Skull base and vertebrae: No acute fracture. Vertebral body heights are maintained. Soft tissues and spinal canal: No prevertebral fluid or swelling. No visible canal hematoma. Disc levels: No significant bony degenerative change. Upper chest: Visualized lung apices are clear. IMPRESSION: 1. No acute intracranial abnormality.  No facial fracture. 2. No evidence of acute fracture or traumatic malalignment in the cervical spine. Electronically Signed   By: Stevenson Elbe M.D.   On: 03/21/2024 03:51   CT Cervical Spine Wo Contrast Result Date: 03/21/2024 CLINICAL DATA:  Facial trauma, blunt; Head trauma, coagulopathy (Age 26-64y); Neck trauma, midline tenderness (Age 26-64y) EXAM: CT HEAD WITHOUT CONTRAST CT MAXILLOFACIAL WITHOUT CONTRAST CT CERVICAL  SPINE WITHOUT CONTRAST TECHNIQUE: Multidetector CT imaging of the head, cervical spine, and maxillofacial structures were performed using the standard protocol without intravenous contrast. Multiplanar CT image reconstructions of the cervical spine and maxillofacial structures were also generated. RADIATION DOSE REDUCTION: This exam was performed according to the departmental dose-optimization program which includes automated exposure control, adjustment of the mA and/or kV according to patient size and/or use of iterative reconstruction technique. COMPARISON:  None Available. FINDINGS: CT HEAD FINDINGS Brain: No evidence of acute infarction, hemorrhage, hydrocephalus, extra-axial collection or mass lesion/mass effect. Vascular: No hyperdense vessel. Skull: No acute fracture. Other: No mastoid effusions. CT MAXILLOFACIAL FINDINGS Osseous: No fracture or mandibular dislocation. No destructive process. Orbits: Negative. No traumatic or inflammatory finding. Sinuses: Clear. Soft tissues: Negative. CT CERVICAL SPINE FINDINGS Alignment: Reversal of the normal cervical lordosis. No substantial sagittal subluxation. Skull base and vertebrae: No acute fracture. Vertebral body heights are maintained. Soft tissues and spinal canal: No prevertebral fluid or swelling. No visible canal hematoma. Disc levels: No significant bony degenerative change. Upper chest: Visualized lung apices are clear. IMPRESSION: 1. No acute intracranial abnormality.  No facial fracture. 2. No evidence of acute fracture or traumatic malalignment in the cervical spine. Electronically Signed   By: Stevenson Elbe M.D.   On: 03/21/2024 03:51   DG Chest Portable 1 View Result Date: 03/21/2024 CLINICAL DATA:  Shortness of breath EXAM: PORTABLE CHEST 1 VIEW COMPARISON:  03/14/2024 FINDINGS: Cardiac shadow is enlarged. Defibrillator is again noted. Lungs are well aerated bilaterally. Mild central vascular congestion is seen without significant edema. No  infiltrate is noted. IMPRESSION: Mild vascular congestion without edema. Electronically Signed   By: Violeta Grey M.D.   On: 03/21/2024 03:49   DG Chest Port 1 View Result Date: 03/14/2024 CLINICAL DATA:  cp EXAM: PORTABLE CHEST - 1 VIEW COMPARISON:  Multiple, most recently, February 17, 2024 FINDINGS: No focal airspace consolidation, pleural effusion, or pneumothorax. Mild cardiomegaly. Left chest pacemaker/AICD with a single lead terminating in the right ventricle. No acute fracture or destructive lesion. Multilevel thoracic osteophytosis. IMPRESSION: No acute cardiopulmonary abnormality. Electronically Signed   By: Rance Burrows M.D.   On: 03/14/2024 08:38    EKG: 03/21/2024 at 0256 -  junctional at 64bpm, low voltage (personally reviewed)  TELEMETRY: accelerated junctional with PVCs (personally reviewed)  DEVICE HISTORY:   [employer] single chamber ICD Battery good Lead measurements stable  Episode fell into VT-1 zone, received several rounds of ATP that were ineffective HV therapy x 1 that terminated episode    Changes made -    Assessment/Plan: #)  HypoK #) VT #) HFrEF #) ICD in situ  Presented to hospital after VT episode with HV therapy On admission, hypoK to 3.1, currently being repleted with most recent potassium at 3.3 He is receiving high dose PO potassium as outpatient, though still presents with hypoK Continue 200mg  amiodarone  BID Continue mexiletine to 150mg  BID Continue diuretics per outpatient doses and regimen Device settings adjusted to give longer ATP during episodes Recommend nephrology consult to further eval potassium wasting  Long term, patient would benefit from upgrade to BiV system with overdrive pacing.         For questions or updates, please contact [facility] Please consult [email or web address] for contact info under Cardiology/STEMI.  Signed, Ivana Nicastro, NP  03/22/2024 12:54 PM

## 2024-03-22 NOTE — Consult Note (Signed)
 [facility]  CONSULT NOTE    Date: 03/22/2024                  Patient Name:  Bradley Powell  MRN: [id number]  DOB: 08/26/1981  Age / Sex: 43 y.o., male         PCP: Merl Star, MD                 Service Requesting Consult: TRH                 Reason for Consult: Hyperkalemia            History of Present Illness: Bradley Powell is a 43 y.o.  male with past medical conditions including chronic combined CHF, CKD, AFL, defibrillator, who was admitted to [facility] on 03/21/2024 for Hypokalemia [E87.6] AICD discharge [Z45.02] Syncope, unspecified syncope type [R55]  Patient seen resting in bed. No family or visitors present.  States he was in his kitchen preparing food and his lunch for the next day when he became dizzy and when he attempted to go to the next room and sit, he passed out.  He states he woke with his mother and father looking over him.  Patient states he has multiple heart issues and has a defibrillator in place that has recently been firing.  Due to his low potassium, he was recently scheduled to follow-up with nephrologist in [facility], has appointment next Wednesday.  Currently on high doses of diuretic and potassium supplementation.  States he is compliant with all of his medications.  Labs on ED arrival sodium 134, potassium 3.1, BUN 39, creatinine 1.99 with GFR 43, troponin 71.  Recent potassium on outpatient labs 5.1, day prior to presentation.  Patient appears to have chronic kidney disease stage IIIb at baseline but is currently at a GFR of 47.  Chest x-ray showed mild vascular congestion.  Due to fall, patient received facial and spinal imaging, no acute findings.   Medications: Outpatient medications: Medications Prior to Admission  Medication Sig Dispense Refill Last Dose/Taking   allopurinol  (ZYLOPRIM ) 100 MG tablet Take 1 tablet (100 mg total) by mouth daily. Please contact PCP for further refills   03/21/2024 at  1:00 AM    amiodarone  (PACERONE ) 200 MG tablet Take 1 tablet (200 mg total) by mouth daily. (Patient taking differently: Take 200 mg by mouth 2 (two) times daily.) 90 tablet 3 03/21/2024 at  1:00 AM   apixaban  (ELIQUIS ) 5 MG TABS tablet TAKE 1 TABLET BY MOUTH TWICE A DAY 60 tablet 11 03/21/2024 at  1:00 AM   atorvastatin  (LIPITOR) 40 MG tablet Take 1 tablet (40 mg total) by mouth daily. 30 tablet 6 03/21/2024   empagliflozin  (JARDIANCE ) 10 MG TABS tablet Take 1 tablet (10 mg total) by mouth daily. 30 tablet 11 03/20/2024 Morning   esomeprazole  (NEXIUM ) 40 MG capsule Take 1 capsule (40 mg total) by mouth 2 (two) times daily. 180 capsule 3 03/21/2024 at  1:00 AM   levothyroxine  (SYNTHROID ) 50 MCG tablet TAKE 1 TABLET (50 MCG TOTAL) BY MOUTH DAILY BEFORE BREAKFAST. TAKE 1 HOUR BEFORE BREAKFAST (Patient taking differently: Take 50 mcg by mouth daily before breakfast.) 90 tablet 3 03/20/2024 Morning   metolazone  (ZAROXOLYN ) 5 MG tablet Take 1 tablet (5 mg total) by mouth 2 (two) times a week every Sunday and Wednesday. 10 tablet 1 03/20/2024   mexiletine (MEXITIL ) 150 MG capsule Take 1 capsule (150 mg total) by mouth every 12 (  twelve) hours. 60 capsule 5 03/20/2024   polyethylene glycol powder (GLYCOLAX /MIRALAX ) 17 GM/SCOOP powder Take 1 CAPFUL (17 g) by mouth daily as needed. (Patient taking differently: Take 17 g by mouth daily as needed for mild constipation or moderate constipation.) 238 g 1 Taking Differently   potassium chloride  SA (KLOR-CON  M) 20 MEQ tablet Take 4 tablets (80 mEq total) by mouth 2 (two) times daily. And 2 tablets at night (Patient taking differently: Take 60-80 mEq by mouth See admin instructions. Take 80 meq in the morning and at bedtime. Take 60 meq in the afternoon) 180 tablet 3 03/21/2024 Morning   spironolactone  (ALDACTONE ) 50 MG tablet Take 1 tablet (50 mg total) by mouth daily. 90 tablet 3 03/20/2024 Morning   torsemide  (DEMADEX ) 20 MG tablet Take 4 tablets (80mg )  in the morning, and 3 tablets (60mg ) in the  afternoon 180 tablet 2 03/20/2024 Noon   diclofenac  Sodium (VOLTAREN ) 1 % GEL Apply 1 Application topically daily as needed (pain).       Current medications: Current Facility-Administered Medications  Medication Dose Route Frequency Provider Last Rate Last Admin   acetaminophen  (TYLENOL ) tablet 650 mg  650 mg Oral Q6H PRN Corrinne Din, MD       allopurinol  (ZYLOPRIM ) tablet 100 mg  100 mg Oral Daily Corrinne Din, MD   100 mg at 03/22/24 1610   amiodarone  (PACERONE ) tablet 200 mg  200 mg Oral BID Corrinne Din, MD   200 mg at 03/22/24 0857   apixaban  (ELIQUIS ) tablet 5 mg  5 mg Oral BID Corrinne Din, MD   5 mg at 03/22/24 9604   aspirin  chewable tablet 81 mg  81 mg Oral Daily Djan, Prince T, MD       atorvastatin  (LIPITOR) tablet 40 mg  40 mg Oral Daily Corrinne Din, MD   40 mg at 03/22/24 5409   empagliflozin  (JARDIANCE ) tablet 10 mg  10 mg Oral Daily Djan, Prince T, MD       mexiletine (MEXITIL ) capsule 150 mg  150 mg Oral Q12H Elisabeth Guild, NP   150 mg at 03/22/24 8119   ondansetron  (ZOFRAN ) tablet 4 mg  4 mg Oral Q6H PRN Corrinne Din, MD       Or   ondansetron  (ZOFRAN ) injection 4 mg  4 mg Intravenous Q6H PRN Newton, Steven J, MD       pantoprazole  (PROTONIX ) EC tablet 40 mg  40 mg Oral Daily Corrinne Din, MD   40 mg at 03/22/24 0858   sodium chloride  flush (NS) 0.9 % injection 3 mL  3 mL Intravenous Q12H Corrinne Din, MD   3 mL at 03/22/24 0857   torsemide  (DEMADEX ) tablet 60 mg  60 mg Oral BID Ezzard Holms, MD          Allergies: Allergies  Allergen Reactions   Entresto  [Sacubitril -Valsartan ] Nausea And Vomiting and Other (See Comments)    Lightheaded, fainting      Past Medical History: Past Medical History:  Diagnosis Date   Atrial flutter (HCC)    a. s/p ablation 03/2018.   Chronic combined systolic and diastolic CHF (congestive heart failure) (HCC)    History of esophagogastroduodenoscopy (EGD)    Morbid obesity (HCC)    NICM  (nonischemic cardiomyopathy) (HCC)    OSA on CPAP    mild OSA with an AHI of 7.7/hr on auto CPAP   PVC's (premature ventricular contractions)      Past Surgical History:  Past Surgical History:  Procedure Laterality Date   A-FLUTTER ABLATION N/A 04/06/2018   Procedure: A-FLUTTER ABLATION;  Surgeon: Tammie Fall, MD;  Location: [facility] INVASIVE CV LAB;  Service: Cardiovascular;  Laterality: N/A;   BIOPSY  07/11/2023   Procedure: BIOPSY;  Surgeon: Felecia Hopper, MD;  Location: [facility] ENDOSCOPY;  Service: Gastroenterology;;   ESOPHAGEAL BRUSHING  06/19/2020   Procedure: ESOPHAGEAL BRUSHING;  Surgeon: Baldo Bonds, MD;  Location: [facility] ENDOSCOPY;  Service: Endoscopy;;   ESOPHAGOGASTRODUODENOSCOPY (EGD) WITH PROPOFOL  Left 06/19/2020   Procedure: ESOPHAGOGASTRODUODENOSCOPY (EGD) WITH PROPOFOL ;  Surgeon: Baldo Bonds, MD;  Location: [facility] ENDOSCOPY;  Service: Endoscopy;  Laterality: Left;   ESOPHAGOGASTRODUODENOSCOPY (EGD) WITH PROPOFOL  N/A 07/11/2023   Procedure: ESOPHAGOGASTRODUODENOSCOPY (EGD) WITH PROPOFOL ;  Surgeon: Felecia Hopper, MD;  Location: [facility] ENDOSCOPY;  Service: Gastroenterology;  Laterality: N/A;   ICD IMPLANT N/A 02/22/2022   Procedure: ICD IMPLANT;  Surgeon: Tammie Fall, MD;  Location: [facility] INVASIVE CV LAB;  Service: Cardiovascular;  Laterality: N/A;   RIGHT HEART CATH N/A 12/20/2019   Procedure: RIGHT HEART CATH;  Surgeon: Mardell Shade, MD;  Location: [facility] INVASIVE CV LAB;  Service: Cardiovascular;  Laterality: N/A;   RIGHT/LEFT HEART CATH AND CORONARY ANGIOGRAPHY N/A 03/01/2017   Procedure: Right/Left Heart Cath and Coronary Angiography;  Surgeon: Mardell Shade, MD;  Location: [facility] INVASIVE CV LAB;  Service: Cardiovascular;  Laterality: N/A;     Family History: Family History  Problem Relation Age of Onset   CVA Mother        pacemaker   Multiple sclerosis Mother    Seizures Mother    Hypertension Father    Gout Father      Social History: Social  History   Socioeconomic History   Marital status: Single    Spouse name: Not on file   Number of children: Not on file   Years of education: Not on file   Highest education level: Not on file  Occupational History   Not on file  Tobacco Use   Smoking status: Never   Smokeless tobacco: Never  Vaping Use   Vaping status: Never Used  Substance and Sexual Activity   Alcohol use: Yes    Comment: rarely   Drug use: No   Sexual activity: Not Currently    Partners: Female  Other Topics Concern   Not on file  Social History Narrative   Not on file   Social Drivers of Health   Financial Resource Strain: Not on file  Food Insecurity: No Food Insecurity (03/21/2024)   Hunger Vital Sign    Worried About Running Out of Food in the Last Year: Never true    Ran Out of Food in the Last Year: Never true  Transportation Needs: No Transportation Needs (03/21/2024)   PRAPARE - Administrator, Civil Service (Medical): No    Lack of Transportation (Non-Medical): No  Physical Activity: Not on file  Stress: Not on file  Social Connections: Moderately Isolated (03/14/2024)   Social Connection and Isolation Panel [NHANES]    Frequency of Communication with Friends and Family: More than three times a week    Frequency of Social Gatherings with Friends and Family: More than three times a week    Attends Religious Services: More than 4 times per year    Active Member of Clubs or Organizations: No    Attends Banker Meetings: Never    Marital Status: Never married  Intimate Partner Violence: Not At Risk (03/21/2024)  Humiliation, Afraid, Rape, and Kick questionnaire    Fear of Current or Ex-Partner: No    Emotionally Abused: No    Physically Abused: No    Sexually Abused: No     Review of Systems: Review of Systems  Constitutional:  Negative for chills, fever and malaise/fatigue.  HENT:  Negative for congestion, sore throat and tinnitus.   Eyes:  Negative for blurred  vision and redness.  Respiratory:  Negative for cough, shortness of breath and wheezing.   Cardiovascular:  Negative for chest pain, palpitations, claudication and leg swelling.  Gastrointestinal:  Negative for abdominal pain, blood in stool, diarrhea, nausea and vomiting.  Genitourinary:  Negative for flank pain, frequency and hematuria.  Musculoskeletal:  Positive for falls. Negative for back pain and myalgias.  Skin:  Negative for rash.  Neurological:  Positive for loss of consciousness. Negative for dizziness, weakness and headaches.  Endo/Heme/Allergies:  Does not bruise/bleed easily.  Psychiatric/Behavioral:  Negative for depression. The patient is not nervous/anxious and does not have insomnia.     Vital Signs: Blood pressure 108/72, pulse 73, temperature 97.6 F (36.4 C), resp. rate 20, height 5\' 8"  (1.727 m), weight 112.2 kg, SpO2 100%.  Weight trends: Filed Weights   03/21/24 0252 03/22/24 0000 03/22/24 0507  Weight: 108.9 kg 112.2 kg 112.2 kg    Physical Exam: General: NAD  Head: Normocephalic, atraumatic. Moist oral mucosal membranes  Eyes: Anicteric  Lungs:  Clear to auscultation  Heart: Regular rate and rhythm  Abdomen:  Soft, nontender  Extremities:  Trace -1+ peripheral edema.  Neurologic: Nonfocal, moving all four extremities  Skin: Lower right lip abrasion  Access: None     Lab results: Basic Metabolic Panel: Recent Labs  Lab 03/16/24 0243 03/20/24 1456 03/21/24 0441 03/22/24 0653  NA 138 132* 134* 133*  K 3.6 5.1 3.1* 3.3*  CL 98 94* 95* 101  CO2 25 25 26 24   GLUCOSE 78 116* 107* 106*  BUN 38* 40* 39* 36*  CREATININE 2.16* 2.18* 1.99* 1.84*  CALCIUM  9.2 9.2 8.9 8.9  MG 2.3  --  2.0  --     Liver Function Tests: Recent Labs  Lab 03/22/24 0653  AST 24  ALT 31  ALKPHOS 102  BILITOT 1.9*  PROT 6.3*  ALBUMIN 2.9*   No results for input(s): "LIPASE", "AMYLASE" in the last 168 hours. No results for input(s): "AMMONIA" in the last 168  hours.  CBC: Recent Labs  Lab 03/21/24 0441 03/22/24 0653  WBC 8.2 6.0  NEUTROABS 6.7  --   HGB 13.4 13.4  HCT 43.4 44.2  MCV 77.6* 77.5*  PLT 256 240    Cardiac Enzymes: No results for input(s): "CKTOTAL", "CKMB", "CKMBINDEX", "TROPONINI" in the last 168 hours.  BNP: Invalid input(s): "POCBNP"  CBG: No results for input(s): "GLUCAP" in the last 168 hours.  Microbiology: Results for orders placed or performed during the hospital encounter of 09/29/23  Culture, blood (Routine X 2) w Reflex to ID Panel     Status: None   Collection Time: 10/05/23 12:02 AM   Specimen: BLOOD RIGHT ARM  Result Value Ref Range Status   Specimen Description BLOOD RIGHT ARM  Final   Special Requests   Final    BOTTLES DRAWN AEROBIC AND ANAEROBIC Blood Culture results may not be optimal due to an excessive volume of blood received in culture bottles   Culture   Final    NO GROWTH 5 DAYS Performed at [facility], [address] [place], [place] [address]    Report Status 10/10/2023 FINAL  Final  Culture, blood (Routine X 2) w Reflex to ID Panel     Status: None   Collection Time: 10/05/23 12:05 AM   Specimen: BLOOD LEFT ARM  Result Value Ref Range Status   Specimen Description BLOOD LEFT ARM  Final   Special Requests   Final    BOTTLES DRAWN AEROBIC AND ANAEROBIC Blood Culture results may not be optimal due to an excessive volume of blood received in culture bottles   Culture   Final    NO GROWTH 5 DAYS Performed at [facility], [address] [place], [place] [address]    Report Status 10/10/2023 FINAL  Final  Urine Culture (for pregnant, neutropenic or urologic patients or patients with an indwelling urinary catheter)     Status: Abnormal   Collection Time: 10/05/23  5:42 AM   Specimen: Urine, Clean Catch  Result Value Ref Range Status   Specimen Description URINE, CLEAN CATCH  Final   Special Requests NONE  Final   Culture (A)  Final    <10,000 COLONIES/mL  INSIGNIFICANT GROWTH Performed at [facility], [address], [place], [place] [address]    Report Status 10/06/2023 FINAL  Final  Respiratory (~20 pathogens) panel by PCR     Status: None   Collection Time: 10/05/23 12:02 PM   Specimen: Nasopharyngeal Swab; Respiratory  Result Value Ref Range Status   Adenovirus NOT DETECTED NOT DETECTED Final   Coronavirus 229E NOT DETECTED NOT DETECTED Final    Comment: (NOTE) The Coronavirus on the Respiratory Panel, DOES NOT test for the novel  Coronavirus (2019 nCoV)    Coronavirus HKU1 NOT DETECTED NOT DETECTED Final   Coronavirus NL63 NOT DETECTED NOT DETECTED Final   Coronavirus OC43 NOT DETECTED NOT DETECTED Final   Metapneumovirus NOT DETECTED NOT DETECTED Final   Rhinovirus / Enterovirus NOT DETECTED NOT DETECTED Final   Influenza A NOT DETECTED NOT DETECTED Final   Influenza B NOT DETECTED NOT DETECTED Final   Parainfluenza Virus 1 NOT DETECTED NOT DETECTED Final   Parainfluenza Virus 2 NOT DETECTED NOT DETECTED Final   Parainfluenza Virus 3 NOT DETECTED NOT DETECTED Final   Parainfluenza Virus 4 NOT DETECTED NOT DETECTED Final   Respiratory Syncytial Virus NOT DETECTED NOT DETECTED Final   Bordetella pertussis NOT DETECTED NOT DETECTED Final   Bordetella Parapertussis NOT DETECTED NOT DETECTED Final   Chlamydophila pneumoniae NOT DETECTED NOT DETECTED Final   Mycoplasma pneumoniae NOT DETECTED NOT DETECTED Final    Comment: Performed at [facility], [address], [place], [place] [address]    Coagulation Studies: No results for input(s): "LABPROT", "INR" in the last 72 hours.  Urinalysis: No results for input(s): "COLORURINE", "LABSPEC", "PHURINE", "GLUCOSEU", "HGBUR", "BILIRUBINUR", "KETONESUR", "PROTEINUR", "UROBILINOGEN", "NITRITE", "LEUKOCYTESUR" in the last 72 hours.  Invalid input(s): "APPERANCEUR"    Imaging: CT Head Wo Contrast Result Date: 03/21/2024 CLINICAL DATA:  Facial trauma, blunt; Head trauma,  coagulopathy (Age 43-64y); Neck trauma, midline tenderness (Age 73-64y) EXAM: CT HEAD WITHOUT CONTRAST CT MAXILLOFACIAL WITHOUT CONTRAST CT CERVICAL SPINE WITHOUT CONTRAST TECHNIQUE: Multidetector CT imaging of the head, cervical spine, and maxillofacial structures were performed using the standard protocol without intravenous contrast. Multiplanar CT image reconstructions of the cervical spine and maxillofacial structures were also generated. RADIATION DOSE REDUCTION: This exam was performed according to the departmental dose-optimization program which includes automated exposure control, adjustment of the  mA and/or kV according to patient size and/or use of iterative reconstruction technique. COMPARISON:  None Available. FINDINGS: CT HEAD FINDINGS Brain: No evidence of acute infarction, hemorrhage, hydrocephalus, extra-axial collection or mass lesion/mass effect. Vascular: No hyperdense vessel. Skull: No acute fracture. Other: No mastoid effusions. CT MAXILLOFACIAL FINDINGS Osseous: No fracture or mandibular dislocation. No destructive process. Orbits: Negative. No traumatic or inflammatory finding. Sinuses: Clear. Soft tissues: Negative. CT CERVICAL SPINE FINDINGS Alignment: Reversal of the normal cervical lordosis. No substantial sagittal subluxation. Skull base and vertebrae: No acute fracture. Vertebral body heights are maintained. Soft tissues and spinal canal: No prevertebral fluid or swelling. No visible canal hematoma. Disc levels: No significant bony degenerative change. Upper chest: Visualized lung apices are clear. IMPRESSION: 1. No acute intracranial abnormality.  No facial fracture. 2. No evidence of acute fracture or traumatic malalignment in the cervical spine. Electronically Signed   By: Stevenson Elbe M.D.   On: 03/21/2024 03:51   CT Maxillofacial WO CM Result Date: 03/21/2024 CLINICAL DATA:  Facial trauma, blunt; Head trauma, coagulopathy (Age 10-64y); Neck trauma, midline tenderness (Age  8-64y) EXAM: CT HEAD WITHOUT CONTRAST CT MAXILLOFACIAL WITHOUT CONTRAST CT CERVICAL SPINE WITHOUT CONTRAST TECHNIQUE: Multidetector CT imaging of the head, cervical spine, and maxillofacial structures were performed using the standard protocol without intravenous contrast. Multiplanar CT image reconstructions of the cervical spine and maxillofacial structures were also generated. RADIATION DOSE REDUCTION: This exam was performed according to the departmental dose-optimization program which includes automated exposure control, adjustment of the mA and/or kV according to patient size and/or use of iterative reconstruction technique. COMPARISON:  None Available. FINDINGS: CT HEAD FINDINGS Brain: No evidence of acute infarction, hemorrhage, hydrocephalus, extra-axial collection or mass lesion/mass effect. Vascular: No hyperdense vessel. Skull: No acute fracture. Other: No mastoid effusions. CT MAXILLOFACIAL FINDINGS Osseous: No fracture or mandibular dislocation. No destructive process. Orbits: Negative. No traumatic or inflammatory finding. Sinuses: Clear. Soft tissues: Negative. CT CERVICAL SPINE FINDINGS Alignment: Reversal of the normal cervical lordosis. No substantial sagittal subluxation. Skull base and vertebrae: No acute fracture. Vertebral body heights are maintained. Soft tissues and spinal canal: No prevertebral fluid or swelling. No visible canal hematoma. Disc levels: No significant bony degenerative change. Upper chest: Visualized lung apices are clear. IMPRESSION: 1. No acute intracranial abnormality.  No facial fracture. 2. No evidence of acute fracture or traumatic malalignment in the cervical spine. Electronically Signed   By: Stevenson Elbe M.D.   On: 03/21/2024 03:51   CT Cervical Spine Wo Contrast Result Date: 03/21/2024 CLINICAL DATA:  Facial trauma, blunt; Head trauma, coagulopathy (Age 33-64y); Neck trauma, midline tenderness (Age 55-64y) EXAM: CT HEAD WITHOUT CONTRAST CT MAXILLOFACIAL  WITHOUT CONTRAST CT CERVICAL SPINE WITHOUT CONTRAST TECHNIQUE: Multidetector CT imaging of the head, cervical spine, and maxillofacial structures were performed using the standard protocol without intravenous contrast. Multiplanar CT image reconstructions of the cervical spine and maxillofacial structures were also generated. RADIATION DOSE REDUCTION: This exam was performed according to the departmental dose-optimization program which includes automated exposure control, adjustment of the mA and/or kV according to patient size and/or use of iterative reconstruction technique. COMPARISON:  None Available. FINDINGS: CT HEAD FINDINGS Brain: No evidence of acute infarction, hemorrhage, hydrocephalus, extra-axial collection or mass lesion/mass effect. Vascular: No hyperdense vessel. Skull: No acute fracture. Other: No mastoid effusions. CT MAXILLOFACIAL FINDINGS Osseous: No fracture or mandibular dislocation. No destructive process. Orbits: Negative. No traumatic or inflammatory finding. Sinuses: Clear. Soft tissues: Negative. CT CERVICAL SPINE FINDINGS Alignment: Reversal of the  normal cervical lordosis. No substantial sagittal subluxation. Skull base and vertebrae: No acute fracture. Vertebral body heights are maintained. Soft tissues and spinal canal: No prevertebral fluid or swelling. No visible canal hematoma. Disc levels: No significant bony degenerative change. Upper chest: Visualized lung apices are clear. IMPRESSION: 1. No acute intracranial abnormality.  No facial fracture. 2. No evidence of acute fracture or traumatic malalignment in the cervical spine. Electronically Signed   By: Stevenson Elbe M.D.   On: 03/21/2024 03:51   DG Chest Portable 1 View Result Date: 03/21/2024 CLINICAL DATA:  Shortness of breath EXAM: PORTABLE CHEST 1 VIEW COMPARISON:  03/14/2024 FINDINGS: Cardiac shadow is enlarged. Defibrillator is again noted. Lungs are well aerated bilaterally. Mild central vascular congestion is seen  without significant edema. No infiltrate is noted. IMPRESSION: Mild vascular congestion without edema. Electronically Signed   By: Violeta Grey M.D.   On: 03/21/2024 03:49     Assessment & Plan: Bradley Powell is a 43 y.o.  male with past medical conditions including chronic combined CHF, CKD, AFL, defibrillator, who was admitted to [facility] on 03/21/2024 for Hypokalemia [E87.6] AICD discharge [Z45.02] Syncope, unspecified syncope type [R55]  Chronic kidney disease stage IIIb, baseline GFR appears to be 37-38.  GFR currently 47.  Upcoming appt with Dr Lydia [facility] at [facility].   2. Hypokalemia due to insufficient supplementation.  Home regimen includes potassium chloride  80 mill equivalents twice a day.  Patient states he only took 40 mill equivalents that night along with his torsemide  60 mg and metolazone .  Unsure of morning dose of potassium was decreased as well.  Patient has received oral supplementation during this admission, potassium 3.3 today.  Diuretics remain held.  Recommend consulting cardiology to discuss home regimen.  Patient currently prescribed spironolactone  50 mg daily and potassium 8 mill equivalents twice daily, we agree with this regimen.   LOS: 0 Nandika Stetzer 5/9/20252:56 PM

## 2024-03-23 DIAGNOSIS — Z95810 Presence of automatic (implantable) cardiac defibrillator: Secondary | ICD-10-CM

## 2024-03-23 DIAGNOSIS — Z4502 Encounter for adjustment and management of automatic implantable cardiac defibrillator: Secondary | ICD-10-CM | POA: Diagnosis not present

## 2024-03-23 DIAGNOSIS — N1832 Chronic kidney disease, stage 3b: Secondary | ICD-10-CM | POA: Diagnosis not present

## 2024-03-23 DIAGNOSIS — I472 Ventricular tachycardia, unspecified: Secondary | ICD-10-CM

## 2024-03-23 DIAGNOSIS — E876 Hypokalemia: Secondary | ICD-10-CM | POA: Diagnosis not present

## 2024-03-23 LAB — CBC WITH DIFFERENTIAL/PLATELET
Abs Immature Granulocytes: 0.03 10*3/uL (ref 0.00–0.07)
Basophils Absolute: 0.1 10*3/uL (ref 0.0–0.1)
Basophils Relative: 1 %
Eosinophils Absolute: 0 10*3/uL (ref 0.0–0.5)
Eosinophils Relative: 1 %
HCT: 46.5 % (ref 39.0–52.0)
Hemoglobin: 14.4 g/dL (ref 13.0–17.0)
Immature Granulocytes: 1 %
Lymphocytes Relative: 7 %
Lymphs Abs: 0.4 10*3/uL — ABNORMAL LOW (ref 0.7–4.0)
MCH: 23.9 pg — ABNORMAL LOW (ref 26.0–34.0)
MCHC: 31 g/dL (ref 30.0–36.0)
MCV: 77.1 fL — ABNORMAL LOW (ref 80.0–100.0)
Monocytes Absolute: 0.6 10*3/uL (ref 0.1–1.0)
Monocytes Relative: 9 %
Neutro Abs: 5.5 10*3/uL (ref 1.7–7.7)
Neutrophils Relative %: 81 %
Platelets: 295 10*3/uL (ref 150–400)
RBC: 6.03 MIL/uL — ABNORMAL HIGH (ref 4.22–5.81)
RDW: 18.6 % — ABNORMAL HIGH (ref 11.5–15.5)
WBC: 6.6 10*3/uL (ref 4.0–10.5)
nRBC: 0 % (ref 0.0–0.2)

## 2024-03-23 LAB — BASIC METABOLIC PANEL WITH GFR
Anion gap: 11 (ref 5–15)
BUN: 41 mg/dL — ABNORMAL HIGH (ref 6–20)
CO2: 28 mmol/L (ref 22–32)
Calcium: 8.9 mg/dL (ref 8.9–10.3)
Chloride: 97 mmol/L — ABNORMAL LOW (ref 98–111)
Creatinine, Ser: 1.83 mg/dL — ABNORMAL HIGH (ref 0.61–1.24)
GFR, Estimated: 47 mL/min — ABNORMAL LOW (ref >60)
Glucose, Bld: 118 mg/dL — ABNORMAL HIGH (ref 70–99)
Potassium: 4.1 mmol/L (ref 3.5–5.1)
Sodium: 136 mmol/L (ref 135–145)

## 2024-03-23 MED ORDER — METOLAZONE 5 MG PO TABS
5.0000 mg | ORAL_TABLET | Freq: Once | ORAL | Status: AC
  Administered 2024-03-23: 5 mg via ORAL
  Filled 2024-03-23 (×2): qty 1

## 2024-03-23 MED ORDER — POTASSIUM CHLORIDE CRYS ER 20 MEQ PO TBCR
40.0000 meq | EXTENDED_RELEASE_TABLET | Freq: Two times a day (BID) | ORAL | Status: DC
  Administered 2024-03-23: 40 meq via ORAL
  Filled 2024-03-23: qty 2

## 2024-03-23 MED ORDER — POTASSIUM CHLORIDE CRYS ER 20 MEQ PO TBCR
80.0000 meq | EXTENDED_RELEASE_TABLET | Freq: Once | ORAL | Status: AC
  Administered 2024-03-23: 80 meq via ORAL
  Filled 2024-03-23: qty 4

## 2024-03-23 MED ORDER — SPIRONOLACTONE 25 MG PO TABS
50.0000 mg | ORAL_TABLET | Freq: Every day | ORAL | Status: DC
  Administered 2024-03-23 – 2024-03-25 (×3): 50 mg via ORAL
  Filled 2024-03-23 (×3): qty 2

## 2024-03-23 NOTE — Progress Notes (Signed)
 [facility]  ROUNDING NOTE   Subjective:   Patient presents in room laying flat. No complaints of shortness of breath. On room air.    Objective:  Vital signs in last 24 hours:  Temp:  [97.6 F (36.4 C)-98.6 F (37 C)] 97.6 F (36.4 C) (05/10 0815) Pulse Rate:  [67-74] 74 (05/10 0815) Resp:  [18-19] 18 (05/10 0815) BP: (88-108)/(66-72) 88/66 (05/10 0815) SpO2:  [94 %-100 %] 100 % (05/10 0815) Weight:  [110.9 kg] 110.9 kg (05/10 0348)  Weight change: -1.315 kg Filed Weights   03/22/24 0000 03/22/24 0507 03/23/24 0348  Weight: 112.2 kg 112.2 kg 110.9 kg    Intake/Output: I/O last 3 completed shifts: In: 1551 [P.O.:1551] Out: -    Intake/Output this shift:  No intake/output data recorded.  Physical Exam: General: NAD,   Head: Normocephalic, atraumatic. Moist oral mucosal membranes  Eyes: Anicteric, PERRL  Neck: Supple, trachea midline  Lungs:  Clear to auscultation  Heart: Regular rate and rhythm  Abdomen:  Soft, nontender,   Extremities:  No peripheral edema.  Neurologic: Nonfocal, moving all four extremities  Skin: No lesions  Access: None    Basic Metabolic Panel: Recent Labs  Lab 03/20/24 1456 03/21/24 0441 03/22/24 0653 03/22/24 1442 03/23/24 0324  NA 132* 134* 133*  --  136  K 5.1 3.1* 3.3* 3.4* 4.1  CL 94* 95* 101  --  97*  CO2 25 26 24   --  28  GLUCOSE 116* 107* 106*  --  118*  BUN 40* 39* 36*  --  41*  CREATININE 2.18* 1.99* 1.84*  --  1.83*  CALCIUM  9.2 8.9 8.9  --  8.9  MG  --  2.0  --   --   --     Liver Function Tests: Recent Labs  Lab 03/22/24 0653  AST 24  ALT 31  ALKPHOS 102  BILITOT 1.9*  PROT 6.3*  ALBUMIN 2.9*   No results for input(s): "LIPASE", "AMYLASE" in the last 168 hours. No results for input(s): "AMMONIA" in the last 168 hours.  CBC: Recent Labs  Lab 03/21/24 0441 03/22/24 0653 03/23/24 0324  WBC 8.2 6.0 6.6  NEUTROABS 6.7  --  5.5  HGB 13.4 13.4 14.4  HCT 43.4 44.2 46.5  MCV 77.6* 77.5*  77.1*  PLT 256 240 295    Cardiac Enzymes: No results for input(s): "CKTOTAL", "CKMB", "CKMBINDEX", "TROPONINI" in the last 168 hours.  BNP: Invalid input(s): "POCBNP"  CBG: No results for input(s): "GLUCAP" in the last 168 hours.  Microbiology: Results for orders placed or performed during the hospital encounter of 09/29/23  Culture, blood (Routine X 2) w Reflex to ID Panel     Status: None   Collection Time: 10/05/23 12:02 AM   Specimen: BLOOD RIGHT ARM  Result Value Ref Range Status   Specimen Description BLOOD RIGHT ARM  Final   Special Requests   Final    BOTTLES DRAWN AEROBIC AND ANAEROBIC Blood Culture results may not be optimal due to an excessive volume of blood received in culture bottles   Culture   Final    NO GROWTH 5 DAYS Performed at [facility], [address], [place], [place] [address]    Report Status 10/10/2023 FINAL  Final  Culture, blood (Routine X 2) w Reflex to ID Panel     Status: None   Collection Time: 10/05/23 12:05 AM   Specimen: BLOOD LEFT ARM  Result Value Ref Range Status  Specimen Description BLOOD LEFT ARM  Final   Special Requests   Final    BOTTLES DRAWN AEROBIC AND ANAEROBIC Blood Culture results may not be optimal due to an excessive volume of blood received in culture bottles   Culture   Final    NO GROWTH 5 DAYS Performed at [facility], [address] [place], [place] [address]    Report Status 10/10/2023 FINAL  Final  Urine Culture (for pregnant, neutropenic or urologic patients or patients with an indwelling urinary catheter)     Status: Abnormal   Collection Time: 10/05/23  5:42 AM   Specimen: Urine, Clean Catch  Result Value Ref Range Status   Specimen Description URINE, CLEAN CATCH  Final   Special Requests NONE  Final   Culture (A)  Final    <10,000 COLONIES/mL INSIGNIFICANT GROWTH Performed at [facility], [address], [place], [place] [address]    Report Status 10/06/2023 FINAL  Final   Respiratory (~20 pathogens) panel by PCR     Status: None   Collection Time: 10/05/23 12:02 PM   Specimen: Nasopharyngeal Swab; Respiratory  Result Value Ref Range Status   Adenovirus NOT DETECTED NOT DETECTED Final   Coronavirus 229E NOT DETECTED NOT DETECTED Final    Comment: (NOTE) The Coronavirus on the Respiratory Panel, DOES NOT test for the novel  Coronavirus (2019 nCoV)    Coronavirus HKU1 NOT DETECTED NOT DETECTED Final   Coronavirus NL63 NOT DETECTED NOT DETECTED Final   Coronavirus OC43 NOT DETECTED NOT DETECTED Final   Metapneumovirus NOT DETECTED NOT DETECTED Final   Rhinovirus / Enterovirus NOT DETECTED NOT DETECTED Final   Influenza A NOT DETECTED NOT DETECTED Final   Influenza B NOT DETECTED NOT DETECTED Final   Parainfluenza Virus 1 NOT DETECTED NOT DETECTED Final   Parainfluenza Virus 2 NOT DETECTED NOT DETECTED Final   Parainfluenza Virus 3 NOT DETECTED NOT DETECTED Final   Parainfluenza Virus 4 NOT DETECTED NOT DETECTED Final   Respiratory Syncytial Virus NOT DETECTED NOT DETECTED Final   Bordetella pertussis NOT DETECTED NOT DETECTED Final   Bordetella Parapertussis NOT DETECTED NOT DETECTED Final   Chlamydophila pneumoniae NOT DETECTED NOT DETECTED Final   Mycoplasma pneumoniae NOT DETECTED NOT DETECTED Final    Comment: Performed at [facility], [address], [place], [place] [address]    Coagulation Studies: No results for input(s): "LABPROT", "INR" in the last 72 hours.  Urinalysis: No results for input(s): "COLORURINE", "LABSPEC", "PHURINE", "GLUCOSEU", "HGBUR", "BILIRUBINUR", "KETONESUR", "PROTEINUR", "UROBILINOGEN", "NITRITE", "LEUKOCYTESUR" in the last 72 hours.  Invalid input(s): "APPERANCEUR"    Imaging: No results found.   Medications:     allopurinol   100 mg Oral Daily   amiodarone   200 mg Oral BID   apixaban   5 mg Oral BID   aspirin   81 mg Oral Daily   atorvastatin   40 mg Oral Daily   empagliflozin   10 mg Oral Daily    mexiletine  150 mg Oral Q12H   pantoprazole   40 mg Oral Daily   potassium chloride   40 mEq Oral BID   sodium chloride  flush  3 mL Intravenous Q12H   torsemide   60 mg Oral BID   acetaminophen , ondansetron  **OR** ondansetron  (ZOFRAN ) IV  Assessment/ Plan:  [name] is a 43 y.o.  male with past medical conditions including chronic combined CHF, CKD, AFL, defibrillator, who was admitted to [facility] on 03/21/2024 for Hypokalemia [E87.6] AICD discharge [Z45.02] Syncope, unspecified  syncope type [R55]    Chronic kidney disease stage IIIb, baseline GFR appears to be 37-38.  GFR currently 47.  Upcoming appt with Dr Lydia [facility] at [facility].     2. Hypokalemia due to insufficient supplementation.  Home regimen includes potassium chloride  80 mill equivalents twice a day.  Patient states he only took 40 mill equivalents that night along with his torsemide  60 mg and metolazone .  Unsure of morning dose of potassium was decreased as well.  Patient currently prescribed potassium chloride  40mEq twice daily, potassium 4.1 today.  Appreciate consulting cardiology to discuss home regimen.  Patient currently prescribed torsemide  60 mg twice daily. Daily BMP   LOS: 0 Sherry Blackard P Lamees Gable 5/10/202510:36 AM

## 2024-03-23 NOTE — Progress Notes (Signed)
 Progress Note   Patient: Bradley Powell NWG:[id number] DOB: Jan 06, 1981 DOA: 03/21/2024     0 DOS: the patient was seen and examined on 03/23/2024   Brief hospital course: From HPI "Bradley Powell is a 43 y.o. male with medical history significant of recurrent VT, chronic combined CHF, AFL, CKD, issues with non-compliance and  hypokalemia presenting with syncope and hypokalemia.  Patient reports getting symptoms to drink overnight when patient felt generalized malaise and weakness.  Had a witnessed syncopal episode at home.  Patient denies any chest pain or nausea prior to the event.  Noted baseline history of similar episodes associated with hypokalemia as well as recurrent V. tach.  Patient with ICD in place.  Patient states he did feel like it possibly discharge.  Noted recent admission May 1 to May 3 on the cardiology EP service with issues concerning for ICD shocking as well as V. Tach and hypokalemia.  Symptoms felt to be predominantly associated with hypokalemia.  Had repletion with resolution of symptoms.  Patient states he had a recheck of his potassium yesterday with a potassium of 5.1.  Patient states he only took half his dose later in the day above his regular time.  Subsequently had episode of syncope.  No focal hemiparesis or confusion.  Patient reports compliance with medication regimen at present including apixaban , torsemide  and spironolactone . Presented to the ER afebrile, blood pressures 80s to 100s.  Satting well on room air.  White count 8.2, hemoglobin 13.4, platelets 256.  Troponin 70s.  Potassium 3.1.  Creatinine 1.99.  CT head, CT C-spine within normal limits.  Chest x-ray with mild vascular congestion.  Pacemaker interrogation with noted shock therapy at 325 today.  Also with antitachycardia pacing.  "   Assessment and Plan: Syncope Recurrent syncopal event in setting of cardiac pacemaker and symptomatic hypokalemia Noted admission on the cardiology EP service earlier  this month with similar issues including recurrent V. tach and hypokalemia Potassium level low despite aggressive replacement I have discussed with cardiologist as well as nephrologist and we suspect patient may be having underlying RTA According to cardiologist since patient is high risk of arrhythmia he cannot be discharged until his potassium level stabilizes with initiation of diuretic therapy I discussed with cardiologist and a dose of metolazone  have been given for today According to cardiologist we will continue to observe patient at least till tomorrow to see how his potassium level goes with maximal diuresis.   Persistent hypokalemia despite repletion Patient still having persistent hypokalemia despite aggressive repletion therapy Continue potassium replacement and monitoring   Atrial fibrillation, chronic (HCC) Rate controlled at present Continue home amiodarone  and Eliquis  Monitor   CKD (chronic kidney disease), stage III (HCC) Appears to be near baseline Will formally consult nephrology in the setting recurrent hypokalemia Monitor   Elevated troponin Troponin 70s in the setting of syncopal event No chest pains Monitor on telemetry closely   ICD (implantable cardioverter-defibrillator) in place Status post ICD placement in the setting of AV block Formally interrogated in the ER with noted shock therapy at 325 this morning as well as antitachycardia pacing      NICM (nonischemic cardiomyopathy) (HCC) 2D echo 11/03/2023 with EF 35 to 40% Appears euvolemic at present Monitor    Advance Care Planning:   Code Status: Full Code    Consults: Nephrology   Family Communication: No family at the bedside        Subjective:  Patient seen and examined at bedside this morning  He admits to improvement in his general condition I discussed with cardiologist and a dose of metolazone  have been given for today According to cardiologist we will continue to observe patient at  least till tomorrow to see how his potassium level goes with maximal diuresis.   Physical Exam: Head: Normocephalic and atraumatic.     Nose: Nose normal.     Mouth/Throat:     Mouth: Mucous membranes are moist.  Eyes:     Pupils: Pupils are equal, round, and reactive to light.  Cardiovascular:     Rate and Rhythm: Normal rate and regular rhythm.  Pulmonary:     Effort: Pulmonary effort is normal.  Abdominal:     General: Bowel sounds are normal.  Musculoskeletal:        General: Normal range of motion.  Skin:    General: Skin is warm.  Neurological:     General: No focal deficit present.  Psychiatric:        Mood and Affect: Mood normal.    Data reviewed:   Telemetry reviewed Telemetry reviewed Vitals:   03/23/24 0348 03/23/24 0505 03/23/24 0815 03/23/24 1148  BP:  97/69 (!) 88/66 100/73  Pulse:  73 74 76  Resp:  19 18   Temp:  98.6 F (37 C) 97.6 F (36.4 C) 97.9 F (36.6 C)  TempSrc:  Oral Oral   SpO2:  100% 100% 99%  Weight: 110.9 kg     Height:          Latest Ref Rng & Units 03/23/2024    3:24 AM 03/22/2024    6:53 AM 03/21/2024    4:41 AM  CBC  WBC 4.0 - 10.5 K/uL 6.6  6.0  8.2   Hemoglobin 13.0 - 17.0 g/dL 16.1  09.6  04.5   Hematocrit 39.0 - 52.0 % 46.5  44.2  43.4   Platelets 150 - 400 K/uL 295  240  256        Latest Ref Rng & Units 03/23/2024    3:24 AM 03/22/2024    2:42 PM 03/22/2024    6:53 AM  BMP  Glucose 70 - 99 mg/dL 409   811   BUN 6 - 20 mg/dL 41   36   Creatinine 9.14 - 1.24 mg/dL 7.82   9.56   Sodium 213 - 145 mmol/L 136   133   Potassium 3.5 - 5.1 mmol/L 4.1  3.4  3.3   Chloride 98 - 111 mmol/L 97   101   CO2 22 - 32 mmol/L 28   24   Calcium  8.9 - 10.3 mg/dL 8.9   8.9      Author: Ezzard Holms, MD 03/23/2024 3:29 PM  For on call review [email or web address].

## 2024-03-23 NOTE — Plan of Care (Signed)
  Problem: Clinical Measurements: Goal: Ability to maintain clinical measurements within normal limits will improve Outcome: Progressing Goal: Diagnostic test results will improve Outcome: Progressing   Problem: Elimination: Goal: Will not experience complications related to urinary retention Outcome: Progressing   Problem: Cardiac: Goal: Will achieve and/or maintain adequate cardiac output Outcome: Progressing

## 2024-03-23 NOTE — Progress Notes (Signed)
 Rounding Note    Patient Name: Bradley Powell Date of Encounter: 03/23/2024  [facility] Cardiologist: Dr. Carolynne Citron, CHF clinic  Subjective   Reports weight is 244 pounds, baseline weight typically 237 pounds He has lower extremity pitting edema Reports taking torsemide  80 in the morning 60 in the afternoon Typically takes 80 potassium in the morning, 60 at noon, additional 80 potassium in the evening  Inpatient Medications    Scheduled Meds:  allopurinol   100 mg Oral Daily   amiodarone   200 mg Oral BID   apixaban   5 mg Oral BID   aspirin   81 mg Oral Daily   atorvastatin   40 mg Oral Daily   empagliflozin   10 mg Oral Daily   metolazone   5 mg Oral Once   mexiletine  150 mg Oral Q12H   pantoprazole   40 mg Oral Daily   potassium chloride   40 mEq Oral BID   sodium chloride  flush  3 mL Intravenous Q12H   torsemide   60 mg Oral BID   Continuous Infusions:  PRN Meds: acetaminophen , ondansetron  **OR** ondansetron  (ZOFRAN ) IV   Vital Signs    Vitals:   03/23/24 0348 03/23/24 0505 03/23/24 0815 03/23/24 1148  BP:  97/69 (!) 88/66 100/73  Pulse:  73 74 76  Resp:  19 18   Temp:  98.6 F (37 C) 97.6 F (36.4 C) 97.9 F (36.6 C)  TempSrc:  Oral Oral   SpO2:  100% 100% 99%  Weight: 110.9 kg     Height:        Intake/Output Summary (Last 24 hours) at 03/23/2024 1438 Last data filed at 03/23/2024 1100 Gross per 24 hour  Intake 1791 ml  Output --  Net 1791 ml      03/23/2024    3:48 AM 03/22/2024    5:07 AM 03/22/2024   12:00 AM  Last 3 Weights  Weight (lbs) 244 lb 8 oz 247 lb 6.4 oz 247 lb 6.4 oz  Weight (kg) 110.904 kg 112.22 kg 112.22 kg      Telemetry    Normal sinus rhythm- Personally Reviewed  ECG     - Personally Reviewed  Physical Exam   GEN: No acute distress.   Neck: No JVD Cardiac: RRR, no murmurs, rubs, or gallops.  Respiratory: Clear to auscultation bilaterally. GI: Soft, nontender, non-distended  MS: 1+ pitting bilateral lower  extremity edema; No deformity. Neuro:  Nonfocal  Psych: Normal affect   Labs    High Sensitivity Troponin:   Recent Labs  Lab 03/14/24 0819 03/14/24 1212 03/21/24 0441 03/21/24 0636 03/21/24 2047  TROPONINIHS 42* 55* 71* 77* 40*     Chemistry Recent Labs  Lab 03/21/24 0441 03/22/24 0653 03/22/24 1442 03/23/24 0324  NA 134* 133*  --  136  K 3.1* 3.3* 3.4* 4.1  CL 95* 101  --  97*  CO2 26 24  --  28  GLUCOSE 107* 106*  --  118*  BUN 39* 36*  --  41*  CREATININE 1.99* 1.84*  --  1.83*  CALCIUM  8.9 8.9  --  8.9  MG 2.0  --   --   --   PROT  --  6.3*  --   --   ALBUMIN  --  2.9*  --   --   AST  --  24  --   --   ALT  --  31  --   --   ALKPHOS  --  102  --   --  BILITOT  --  1.9*  --   --   GFRNONAA 43* 47*  --  47*  ANIONGAP 13 8  --  11    Lipids No results for input(s): "CHOL", "TRIG", "HDL", "LABVLDL", "LDLCALC", "CHOLHDL" in the last 168 hours.  Hematology Recent Labs  Lab 03/21/24 0441 03/22/24 0653 03/23/24 0324  WBC 8.2 6.0 6.6  RBC 5.59 5.70 6.03*  HGB 13.4 13.4 14.4  HCT 43.4 44.2 46.5  MCV 77.6* 77.5* 77.1*  MCH 24.0* 23.5* 23.9*  MCHC 30.9 30.3 31.0  RDW 18.2* 18.5* 18.6*  PLT 256 240 295   Thyroid  No results for input(s): "TSH", "FREET4" in the last 168 hours.  BNPNo results for input(s): "BNP", "PROBNP" in the last 168 hours.  DDimer No results for input(s): "DDIMER" in the last 168 hours.   Radiology    No results found.  Cardiac Studies     Patient Profile     LINDBERGH FELIU is a 43 y.o. male with past medical history of CHF, atrial fibrillation on Eliquis , ventricular tachycardia, and CKD who presents to the ED complaining of syncope, VT, ICD shock  Assessment & Plan    --VT, ICD shock Loss of consciousness Received ATP that were not effective ultimately received high-voltage therapy x 1 that terminated arrhythmia Presented to hospital after VT episode with HV therapy  - Seen by EP yesterday, recommendation to  aggressively replete potassium - Continue amiodarone  200 twice daily, mexiletine 150 twice daily - Will restart my 80 in the morning 60 in the afternoon, he is requesting dose of metolazone  today as weight is 244, baseline weight 237  -Hypokalemia Unclear medication adherence Reports taking potassium 80 twice daily with 60 at noon Restart spironolactone  50 daily   For questions or updates, please contact [facility] Please consult [email or web address] for contact info under        Signed, Mahin Guardia, MD  03/23/2024, 2:38 PM

## 2024-03-24 DIAGNOSIS — E876 Hypokalemia: Secondary | ICD-10-CM | POA: Diagnosis present

## 2024-03-24 DIAGNOSIS — I130 Hypertensive heart and chronic kidney disease with heart failure and stage 1 through stage 4 chronic kidney disease, or unspecified chronic kidney disease: Secondary | ICD-10-CM | POA: Diagnosis not present

## 2024-03-24 DIAGNOSIS — Z95810 Presence of automatic (implantable) cardiac defibrillator: Secondary | ICD-10-CM | POA: Diagnosis not present

## 2024-03-24 DIAGNOSIS — S01511A Laceration without foreign body of lip, initial encounter: Secondary | ICD-10-CM | POA: Diagnosis not present

## 2024-03-24 DIAGNOSIS — W1839XA Other fall on same level, initial encounter: Secondary | ICD-10-CM | POA: Diagnosis not present

## 2024-03-24 DIAGNOSIS — Z4502 Encounter for adjustment and management of automatic implantable cardiac defibrillator: Secondary | ICD-10-CM | POA: Diagnosis not present

## 2024-03-24 DIAGNOSIS — Y92000 Kitchen of unspecified non-institutional (private) residence as  the place of occurrence of the external cause: Secondary | ICD-10-CM | POA: Diagnosis not present

## 2024-03-24 DIAGNOSIS — I493 Ventricular premature depolarization: Secondary | ICD-10-CM | POA: Diagnosis not present

## 2024-03-24 DIAGNOSIS — I482 Chronic atrial fibrillation, unspecified: Secondary | ICD-10-CM | POA: Diagnosis not present

## 2024-03-24 DIAGNOSIS — E875 Hyperkalemia: Secondary | ICD-10-CM | POA: Diagnosis not present

## 2024-03-24 DIAGNOSIS — E871 Hypo-osmolality and hyponatremia: Secondary | ICD-10-CM | POA: Diagnosis not present

## 2024-03-24 DIAGNOSIS — Z79890 Hormone replacement therapy (postmenopausal): Secondary | ICD-10-CM | POA: Diagnosis not present

## 2024-03-24 DIAGNOSIS — E039 Hypothyroidism, unspecified: Secondary | ICD-10-CM | POA: Diagnosis not present

## 2024-03-24 DIAGNOSIS — I472 Ventricular tachycardia, unspecified: Secondary | ICD-10-CM | POA: Diagnosis not present

## 2024-03-24 DIAGNOSIS — R55 Syncope and collapse: Secondary | ICD-10-CM | POA: Diagnosis not present

## 2024-03-24 DIAGNOSIS — E669 Obesity, unspecified: Secondary | ICD-10-CM | POA: Diagnosis not present

## 2024-03-24 DIAGNOSIS — T502X5A Adverse effect of carbonic-anhydrase inhibitors, benzothiadiazides and other diuretics, initial encounter: Secondary | ICD-10-CM | POA: Diagnosis not present

## 2024-03-24 DIAGNOSIS — Z79899 Other long term (current) drug therapy: Secondary | ICD-10-CM | POA: Diagnosis not present

## 2024-03-24 DIAGNOSIS — I4892 Unspecified atrial flutter: Secondary | ICD-10-CM | POA: Diagnosis not present

## 2024-03-24 DIAGNOSIS — I4589 Other specified conduction disorders: Secondary | ICD-10-CM | POA: Diagnosis not present

## 2024-03-24 DIAGNOSIS — Z7901 Long term (current) use of anticoagulants: Secondary | ICD-10-CM | POA: Diagnosis not present

## 2024-03-24 DIAGNOSIS — Z7984 Long term (current) use of oral hypoglycemic drugs: Secondary | ICD-10-CM | POA: Diagnosis not present

## 2024-03-24 DIAGNOSIS — G4733 Obstructive sleep apnea (adult) (pediatric): Secondary | ICD-10-CM | POA: Diagnosis not present

## 2024-03-24 DIAGNOSIS — N1832 Chronic kidney disease, stage 3b: Secondary | ICD-10-CM | POA: Diagnosis not present

## 2024-03-24 DIAGNOSIS — E66812 Obesity, class 2: Secondary | ICD-10-CM | POA: Diagnosis not present

## 2024-03-24 DIAGNOSIS — I5042 Chronic combined systolic (congestive) and diastolic (congestive) heart failure: Secondary | ICD-10-CM | POA: Diagnosis not present

## 2024-03-24 DIAGNOSIS — I428 Other cardiomyopathies: Secondary | ICD-10-CM | POA: Diagnosis not present

## 2024-03-24 DIAGNOSIS — I480 Paroxysmal atrial fibrillation: Secondary | ICD-10-CM | POA: Diagnosis not present

## 2024-03-24 DIAGNOSIS — I5022 Chronic systolic (congestive) heart failure: Secondary | ICD-10-CM | POA: Diagnosis not present

## 2024-03-24 DIAGNOSIS — I440 Atrioventricular block, first degree: Secondary | ICD-10-CM | POA: Diagnosis not present

## 2024-03-24 DIAGNOSIS — Z1152 Encounter for screening for COVID-19: Secondary | ICD-10-CM | POA: Diagnosis not present

## 2024-03-24 LAB — CBC WITH DIFFERENTIAL/PLATELET
Abs Immature Granulocytes: 0.04 10*3/uL (ref 0.00–0.07)
Basophils Absolute: 0 10*3/uL (ref 0.0–0.1)
Basophils Relative: 1 %
Eosinophils Absolute: 0 10*3/uL (ref 0.0–0.5)
Eosinophils Relative: 1 %
HCT: 47.3 % (ref 39.0–52.0)
Hemoglobin: 14.6 g/dL (ref 13.0–17.0)
Immature Granulocytes: 1 %
Lymphocytes Relative: 7 %
Lymphs Abs: 0.4 10*3/uL — ABNORMAL LOW (ref 0.7–4.0)
MCH: 23.8 pg — ABNORMAL LOW (ref 26.0–34.0)
MCHC: 30.9 g/dL (ref 30.0–36.0)
MCV: 77.2 fL — ABNORMAL LOW (ref 80.0–100.0)
Monocytes Absolute: 0.7 10*3/uL (ref 0.1–1.0)
Monocytes Relative: 11 %
Neutro Abs: 4.6 10*3/uL (ref 1.7–7.7)
Neutrophils Relative %: 79 %
Platelets: 275 10*3/uL (ref 150–400)
RBC: 6.13 MIL/uL — ABNORMAL HIGH (ref 4.22–5.81)
RDW: 19 % — ABNORMAL HIGH (ref 11.5–15.5)
WBC: 5.8 10*3/uL (ref 4.0–10.5)
nRBC: 0 % (ref 0.0–0.2)

## 2024-03-24 LAB — BASIC METABOLIC PANEL WITH GFR
Anion gap: 13 (ref 5–15)
BUN: 44 mg/dL — ABNORMAL HIGH (ref 6–20)
CO2: 29 mmol/L (ref 22–32)
Calcium: 9.2 mg/dL (ref 8.9–10.3)
Chloride: 93 mmol/L — ABNORMAL LOW (ref 98–111)
Creatinine, Ser: 2.37 mg/dL — ABNORMAL HIGH (ref 0.61–1.24)
GFR, Estimated: 35 mL/min — ABNORMAL LOW (ref >60)
Glucose, Bld: 175 mg/dL — ABNORMAL HIGH (ref 70–99)
Potassium: 3.1 mmol/L — ABNORMAL LOW (ref 3.5–5.1)
Sodium: 135 mmol/L (ref 135–145)

## 2024-03-24 MED ORDER — POTASSIUM CHLORIDE 10 MEQ/100ML IV SOLN
10.0000 meq | INTRAVENOUS | Status: AC
  Administered 2024-03-24 (×4): 10 meq via INTRAVENOUS
  Filled 2024-03-24 (×4): qty 100

## 2024-03-24 MED ORDER — POTASSIUM CHLORIDE CRYS ER 20 MEQ PO TBCR
60.0000 meq | EXTENDED_RELEASE_TABLET | Freq: Two times a day (BID) | ORAL | Status: AC
Start: 2024-03-24 — End: 2024-03-24
  Administered 2024-03-24 (×2): 60 meq via ORAL
  Filled 2024-03-24 (×2): qty 3

## 2024-03-24 NOTE — Progress Notes (Signed)
 [facility]  ROUNDING NOTE   Subjective:  Patient seen during rounding, resting in bed, supine. Patient endorses frustration with fluid restriction. Counseled on purpose and encouraged positive behaviors. Patient remains on room air, no physical complaints reported.   Objective:  Vital signs in last 24 hours:  Temp:  [97.7 F (36.5 C)-98.3 F (36.8 C)] 98.2 F (36.8 C) (05/11 1105) Pulse Rate:  [67-77] 74 (05/11 1105) Resp:  [16-19] 18 (05/11 1105) BP: (92-103)/(47-73) 101/73 (05/11 1105) SpO2:  [94 %-100 %] 94 % (05/11 1105) Weight:  [108 kg] 108 kg (05/11 0516)  Weight change: -2.948 kg Filed Weights   03/22/24 0507 03/23/24 0348 03/24/24 0516  Weight: 112.2 kg 110.9 kg 108 kg    Intake/Output: I/O last 3 completed shifts: In: 1491 [P.O.:1488; I.V.:3] Out: -    Intake/Output this shift:  Total I/O In: 240 [P.O.:240] Out: -   Physical Exam: General: NAD,   Head: Normocephalic, atraumatic. Moist oral mucosal membranes  Eyes: Anicteric, PERRL  Neck: Supple, trachea midline  Lungs:  Clear to auscultation  Heart: Regular rate and rhythm  Abdomen:  Soft, nontender,   Extremities:  No peripheral edema.  Neurologic: Nonfocal, moving all four extremities  Skin: No lesions  Access: None    Basic Metabolic Panel: Recent Labs  Lab 03/20/24 1456 03/21/24 0441 03/22/24 0653 03/22/24 1442 03/23/24 0324 03/24/24 0434  NA 132* 134* 133*  --  136 135  K 5.1 3.1* 3.3* 3.4* 4.1 3.1*  CL 94* 95* 101  --  97* 93*  CO2 25 26 24   --  28 29  GLUCOSE 116* 107* 106*  --  118* 175*  BUN 40* 39* 36*  --  41* 44*  CREATININE 2.18* 1.99* 1.84*  --  1.83* 2.37*  CALCIUM  9.2 8.9 8.9  --  8.9 9.2  MG  --  2.0  --   --   --   --     Liver Function Tests: Recent Labs  Lab 03/22/24 0653  AST 24  ALT 31  ALKPHOS 102  BILITOT 1.9*  PROT 6.3*  ALBUMIN 2.9*   No results for input(s): "LIPASE", "AMYLASE" in the last 168 hours. No results for input(s): "AMMONIA"  in the last 168 hours.  CBC: Recent Labs  Lab 03/21/24 0441 03/22/24 0653 03/23/24 0324 03/24/24 0434  WBC 8.2 6.0 6.6 5.8  NEUTROABS 6.7  --  5.5 4.6  HGB 13.4 13.4 14.4 14.6  HCT 43.4 44.2 46.5 47.3  MCV 77.6* 77.5* 77.1* 77.2*  PLT 256 240 295 275    Cardiac Enzymes: No results for input(s): "CKTOTAL", "CKMB", "CKMBINDEX", "TROPONINI" in the last 168 hours.  BNP: Invalid input(s): "POCBNP"  CBG: No results for input(s): "GLUCAP" in the last 168 hours.  Microbiology: Results for orders placed or performed during the hospital encounter of 09/29/23  Culture, blood (Routine X 2) w Reflex to ID Panel     Status: None   Collection Time: 10/05/23 12:02 AM   Specimen: BLOOD RIGHT ARM  Result Value Ref Range Status   Specimen Description BLOOD RIGHT ARM  Final   Special Requests   Final    BOTTLES DRAWN AEROBIC AND ANAEROBIC Blood Culture results may not be optimal due to an excessive volume of blood received in culture bottles   Culture   Final    NO GROWTH 5 DAYS Performed at [facility], [address], [place], [place] [address]    Report Status 10/10/2023 FINAL  Final  Culture, blood (Routine X 2) w Reflex to ID Panel     Status: None   Collection Time: 10/05/23 12:05 AM   Specimen: BLOOD LEFT ARM  Result Value Ref Range Status   Specimen Description BLOOD LEFT ARM  Final   Special Requests   Final    BOTTLES DRAWN AEROBIC AND ANAEROBIC Blood Culture results may not be optimal due to an excessive volume of blood received in culture bottles   Culture   Final    NO GROWTH 5 DAYS Performed at [facility], [address], [place], [place] [address]    Report Status 10/10/2023 FINAL  Final  Urine Culture (for pregnant, neutropenic or urologic patients or patients with an indwelling urinary catheter)     Status: Abnormal   Collection Time: 10/05/23  5:42 AM   Specimen: Urine, Clean Catch  Result Value Ref Range Status   Specimen Description URINE, CLEAN  CATCH  Final   Special Requests NONE  Final   Culture (A)  Final    <10,000 COLONIES/mL INSIGNIFICANT GROWTH Performed at [facility], [address] [place], [place] [address]    Report Status 10/06/2023 FINAL  Final  Respiratory (~20 pathogens) panel by PCR     Status: None   Collection Time: 10/05/23 12:02 PM   Specimen: Nasopharyngeal Swab; Respiratory  Result Value Ref Range Status   Adenovirus NOT DETECTED NOT DETECTED Final   Coronavirus 229E NOT DETECTED NOT DETECTED Final    Comment: (NOTE) The Coronavirus on the Respiratory Panel, DOES NOT test for the novel  Coronavirus (2019 nCoV)    Coronavirus HKU1 NOT DETECTED NOT DETECTED Final   Coronavirus NL63 NOT DETECTED NOT DETECTED Final   Coronavirus OC43 NOT DETECTED NOT DETECTED Final   Metapneumovirus NOT DETECTED NOT DETECTED Final   Rhinovirus / Enterovirus NOT DETECTED NOT DETECTED Final   Influenza A NOT DETECTED NOT DETECTED Final   Influenza B NOT DETECTED NOT DETECTED Final   Parainfluenza Virus 1 NOT DETECTED NOT DETECTED Final   Parainfluenza Virus 2 NOT DETECTED NOT DETECTED Final   Parainfluenza Virus 3 NOT DETECTED NOT DETECTED Final   Parainfluenza Virus 4 NOT DETECTED NOT DETECTED Final   Respiratory Syncytial Virus NOT DETECTED NOT DETECTED Final   Bordetella pertussis NOT DETECTED NOT DETECTED Final   Bordetella Parapertussis NOT DETECTED NOT DETECTED Final   Chlamydophila pneumoniae NOT DETECTED NOT DETECTED Final   Mycoplasma pneumoniae NOT DETECTED NOT DETECTED Final    Comment: Performed at [facility], [address] [place], [place] [address]    Coagulation Studies: No results for input(s): "LABPROT", "INR" in the last 72 hours.  Urinalysis: No results for input(s): "COLORURINE", "LABSPEC", "PHURINE", "GLUCOSEU", "HGBUR", "BILIRUBINUR", "KETONESUR", "PROTEINUR", "UROBILINOGEN", "NITRITE", "LEUKOCYTESUR" in the last 72 hours.  Invalid input(s): "APPERANCEUR"    Imaging: No  results found.   Medications:    potassium chloride  10 mEq (03/24/24 1003)    allopurinol   100 mg Oral Daily   amiodarone   200 mg Oral BID   apixaban   5 mg Oral BID   aspirin   81 mg Oral Daily   atorvastatin   40 mg Oral Daily   empagliflozin   10 mg Oral Daily   mexiletine  150 mg Oral Q12H   pantoprazole   40 mg Oral Daily   potassium chloride   60 mEq Oral BID   sodium chloride  flush  3 mL Intravenous Q12H   spironolactone   50 mg Oral Daily  torsemide   60 mg Oral BID   acetaminophen , ondansetron  **OR** ondansetron  (ZOFRAN ) IV  Assessment/ Plan:  [name] is a 43 y.o.  male with past medical conditions including chronic combined CHF, CKD, AFL, defibrillator, who was admitted to [facility] on 03/21/2024 for Hypokalemia [E87.6] AICD discharge [Z45.02] Syncope, unspecified syncope type [R55]    Acute on Chronic kidney disease stage IIIb, baseline GFR appears to be 37-38.  GFR currently 47.  Up[facility]. Rise in creatinine noted today suspect from diuretic use. GFR still at baseline. No need for dialysis at this time. Will continue to monitor renal indices.     Lab Results  Component Value Date   CREATININE 2.37 (H) 03/24/2024   CREATININE 1.83 (H) 03/23/2024   CREATININE 1.84 (H) 03/22/2024      Intake/Output Summary (Last 24 hours) at 03/24/2024 1234 Last data filed at 03/24/2024 0836 Gross per 24 hour  Intake 420 ml  Output --  Net 420 ml    2. Hypokalemia due to insufficient supplementation.  Home regimen includes potassium chloride  80 mill equivalents twice a day.  Patient states he only took 40 mill equivalents that night along with his torsemide  60 mg and metolazone .  Unsure of morning dose of potassium was decreased as well.  Patient currently prescribed potassium chloride  60mEq twice daily, potassium 3.1 today. IV replacements ordered by primary team as well. Appreciate consulting cardiology to discuss home regimen.  Patient  currently prescribed torsemide  60 mg twice daily and now spironolactone  50mg  daily. Daily BMP     LOS: 0 [place] Kallio Marisa Sickles 5/11/202512:24 PM

## 2024-03-24 NOTE — Plan of Care (Signed)
   Problem: Clinical Measurements: Goal: Will remain free from infection Outcome: Progressing Goal: Diagnostic test results will improve Outcome: Progressing   Problem: Nutrition: Goal: Adequate nutrition will be maintained Outcome: Progressing

## 2024-03-24 NOTE — Progress Notes (Signed)
 Rounding Note    Patient Name: Bradley Powell Date of Encounter: 03/24/2024  [facility] Cardiologist: Dr. Carolynne Citron, CHF clinic  Subjective   Yesterday weight 244, reported baseline weight 237 He requested dose of metolazone  which was provided with his torsemide  40 in the morning 80 in the evening Potassium down to 3.1 from 4.1 this morning Weight this morning 237, down to his baseline Overall no complaints " Would like to get my potassium right" Scheduled for potassium 60/80/60 Does not want to try potassium powder  Inpatient Medications    Scheduled Meds:  allopurinol   100 mg Oral Daily   amiodarone   200 mg Oral BID   apixaban   5 mg Oral BID   aspirin   81 mg Oral Daily   atorvastatin   40 mg Oral Daily   empagliflozin   10 mg Oral Daily   mexiletine  150 mg Oral Q12H   pantoprazole   40 mg Oral Daily   potassium chloride   60 mEq Oral BID   sodium chloride  flush  3 mL Intravenous Q12H   spironolactone   50 mg Oral Daily   torsemide   60 mg Oral BID   Continuous Infusions:  potassium chloride  10 mEq (03/24/24 1251)   PRN Meds: acetaminophen , ondansetron  **OR** ondansetron  (ZOFRAN ) IV   Vital Signs    Vitals:   03/24/24 0400 03/24/24 0516 03/24/24 0730 03/24/24 1105  BP: 103/64  97/65 101/73  Pulse: 77  74 74  Resp: 18  16 18   Temp: 98 F (36.7 C)  98.2 F (36.8 C) 98.2 F (36.8 C)  TempSrc: Oral     SpO2: 100%  99% 94%  Weight:  108 kg    Height:        Intake/Output Summary (Last 24 hours) at 03/24/2024 1331 Last data filed at 03/24/2024 0836 Gross per 24 hour  Intake 420 ml  Output --  Net 420 ml      03/24/2024    5:16 AM 03/23/2024    3:48 AM 03/22/2024    5:07 AM  Last 3 Weights  Weight (lbs) 238 lb 244 lb 8 oz 247 lb 6.4 oz  Weight (kg) 107.956 kg 110.904 kg 112.22 kg      Telemetry    Normal this rhythm- Personally Reviewed  ECG     - Personally Reviewed  Physical Exam   Constitutional:  oriented to person, place, and  time. No distress.  HENT:  Head: Grossly normal Eyes:  no discharge. No scleral icterus.  Neck: No JVD, no carotid bruits  Cardiovascular: Regular rate and rhythm, no murmurs appreciated Pulmonary/Chest: Clear to auscultation bilaterally, no wheezes or rales Abdominal: Soft.  no distension.  no tenderness.  Musculoskeletal: Normal range of motion Neurological:  normal muscle tone. Coordination normal. No atrophy Skin: Skin warm and dry Psychiatric: normal affect, pleasant   Labs    High Sensitivity Troponin:   Recent Labs  Lab 03/14/24 0819 03/14/24 1212 03/21/24 0441 03/21/24 0636 03/21/24 2047  TROPONINIHS 42* 55* 71* 77* 40*     Chemistry Recent Labs  Lab 03/21/24 0441 03/22/24 0653 03/22/24 1442 03/23/24 0324 03/24/24 0434  NA 134* 133*  --  136 135  K 3.1* 3.3* 3.4* 4.1 3.1*  CL 95* 101  --  97* 93*  CO2 26 24  --  28 29  GLUCOSE 107* 106*  --  118* 175*  BUN 39* 36*  --  41* 44*  CREATININE 1.99* 1.84*  --  1.83* 2.37*  CALCIUM  8.9  8.9  --  8.9 9.2  MG 2.0  --   --   --   --   PROT  --  6.3*  --   --   --   ALBUMIN  --  2.9*  --   --   --   AST  --  24  --   --   --   ALT  --  31  --   --   --   ALKPHOS  --  102  --   --   --   BILITOT  --  1.9*  --   --   --   GFRNONAA 43* 47*  --  47* 35*  ANIONGAP 13 8  --  11 13    Lipids No results for input(s): "CHOL", "TRIG", "HDL", "LABVLDL", "LDLCALC", "CHOLHDL" in the last 168 hours.  Hematology Recent Labs  Lab 03/22/24 0653 03/23/24 0324 03/24/24 0434  WBC 6.0 6.6 5.8  RBC 5.70 6.03* 6.13*  HGB 13.4 14.4 14.6  HCT 44.2 46.5 47.3  MCV 77.5* 77.1* 77.2*  MCH 23.5* 23.9* 23.8*  MCHC 30.3 31.0 30.9  RDW 18.5* 18.6* 19.0*  PLT 240 295 275   Thyroid  No results for input(s): "TSH", "FREET4" in the last 168 hours.  BNPNo results for input(s): "BNP", "PROBNP" in the last 168 hours.  DDimer No results for input(s): "DDIMER" in the last 168 hours.   Radiology    No results found.  Cardiac Studies      Patient Profile     Bradley Powell is a 43 y.o. male with past medical history of CHF, atrial fibrillation on Eliquis , ventricular tachycardia, and CKD who presents to the ED complaining of syncope, VT, ICD shock  Assessment & Plan    --VT, ICD shock Loss of consciousness Received ATP, followed by high-voltage therapy x 1 that terminated arrhythmia Presented to hospital after VT episode and HV therapy - Seen by EP this admission, recommendation to aggressively replete potassium - Continue amiodarone  200 twice daily, mexiletine 150 twice daily - Continue torsemide  60 twice daily  metolazone  sparingly - Aggressive potassium repletion today - Followed by CHF clinic  -Hypokalemia Unclear medication adherence Aggressive potassium repletion today Restart spironolactone  50 daily  He is requesting we lift his fluid restriction " I know what the "F.." to do"  For questions or updates, please contact [facility] Please consult [email or web address] for contact info under        Signed, Gresham Caetano, MD  03/24/2024, 1:31 PM

## 2024-03-24 NOTE — Progress Notes (Signed)
 Progress Note   Patient: Bradley Powell WUJ:[id number] DOB: 1981-02-10 DOA: 03/21/2024     0 DOS: the patient was seen and examined on 03/24/2024     Brief hospital course: From HPI "GAYLARD HITT is a 43 y.o. male with medical history significant of recurrent VT, chronic combined CHF, AFL, CKD, issues with non-compliance and  hypokalemia presenting with syncope and hypokalemia.  Patient reports getting symptoms to drink overnight when patient felt generalized malaise and weakness.  Had a witnessed syncopal episode at home.  Patient denies any chest pain or nausea prior to the event.  Noted baseline history of similar episodes associated with hypokalemia as well as recurrent V. tach.  Patient with ICD in place.  Patient states he did feel like it possibly discharge.  Noted recent admission May 1 to May 3 on the cardiology EP service with issues concerning for ICD shocking as well as V. Tach and hypokalemia.  Symptoms felt to be predominantly associated with hypokalemia.  Had repletion with resolution of symptoms.  Patient states he had a recheck of his potassium yesterday with a potassium of 5.1.  Patient states he only took half his dose later in the day above his regular time.  Subsequently had episode of syncope.  No focal hemiparesis or confusion.  Patient reports compliance with medication regimen at present including apixaban , torsemide  and spironolactone . Presented to the ER afebrile, blood pressures 80s to 100s.  Satting well on room air.  White count 8.2, hemoglobin 13.4, platelets 256.  Troponin 70s.  Potassium 3.1.  Creatinine 1.99.  CT head, CT C-spine within normal limits.  Chest x-ray with mild vascular congestion.  Pacemaker interrogation with noted shock therapy at 325 today.  Also with antitachycardia pacing.  "   Assessment and Plan: Syncope Recurrent syncopal event in setting of cardiac pacemaker and symptomatic hypokalemia Noted admission on the cardiology EP service earlier  this month with similar issues including recurrent V. tach and hypokalemia Potassium level low despite aggressive replacement I have discussed with cardiologist as well as nephrologist and we suspect patient may be having underlying RTA According to cardiologist since patient is high risk of arrhythmia he cannot be discharged until his potassium level stabilizes with initiation of diuretic therapy Continue aggressive potassium repletion   Persistent hypokalemia despite repletion Patient still having persistent hypokalemia despite aggressive repletion therapy Continue potassium replacement and monitoring   Atrial fibrillation, chronic (HCC) Rate controlled at present Continue home amiodarone  and Eliquis  Monitor   CKD (chronic kidney disease), stage III (HCC) Appears to be near baseline Will formally consult nephrology in the setting recurrent hypokalemia Monitor   Elevated troponin Troponin 70s in the setting of syncopal event No chest pains Monitor on telemetry closely   ICD (implantable cardioverter-defibrillator) in place Status post ICD placement in the setting of AV block Formally interrogated in the ER with noted shock therapy at 325 this morning as well as antitachycardia pacing      NICM (nonischemic cardiomyopathy) (HCC) 2D echo 11/03/2023 with EF 35 to 40% Appears euvolemic at present Monitor    Advance Care Planning:   Code Status: Full Code    Consults: Nephrology   Family Communication: No family at the bedside        Subjective:  Patient seen and examined at bedside this morning Patient asked for his fluid restrictions to be lifted I had an extensive discussion with him why we are unable to do so given aggressive diuresis at this time Potassium level  still on the lower side   Physical Exam: Head: Normocephalic and atraumatic.     Nose: Nose normal.     Mouth/Throat:     Mouth: Mucous membranes are moist.  Eyes:     Pupils: Pupils are equal, round,  and reactive to light.  Cardiovascular:     Rate and Rhythm: Normal rate and regular rhythm.  Pulmonary:     Effort: Pulmonary effort is normal.  Abdominal:     General: Bowel sounds are normal.  Musculoskeletal:        General: Normal range of motion.  Skin:    General: Skin is warm.  Neurological:     General: No focal deficit present.  Psychiatric:        Mood and Affect: Mood normal.    Data reviewed     Latest Ref Rng & Units 03/24/2024    4:34 AM 03/23/2024    3:24 AM 03/22/2024    6:53 AM  CBC  WBC 4.0 - 10.5 K/uL 5.8  6.6  6.0   Hemoglobin 13.0 - 17.0 g/dL 16.1  09.6  04.5   Hematocrit 39.0 - 52.0 % 47.3  46.5  44.2   Platelets 150 - 400 K/uL 275  295  240        Latest Ref Rng & Units 03/24/2024    4:34 AM 03/23/2024    3:24 AM 03/22/2024    2:42 PM  BMP  Glucose 70 - 99 mg/dL 409  811    BUN 6 - 20 mg/dL 44  41    Creatinine 9.14 - 1.24 mg/dL 7.82  9.56    Sodium 213 - 145 mmol/L 135  136    Potassium 3.5 - 5.1 mmol/L 3.1  4.1  3.4   Chloride 98 - 111 mmol/L 93  97    CO2 22 - 32 mmol/L 29  28    Calcium  8.9 - 10.3 mg/dL 9.2  8.9       Vitals:   03/24/24 0516 03/24/24 0730 03/24/24 1105 03/24/24 1517  BP:  97/65 101/73 97/77  Pulse:  74 74 76  Resp:  16 18   Temp:  98.2 F (36.8 C) 98.2 F (36.8 C)   TempSrc:      SpO2:  99% 94% 97%  Weight: 108 kg     Height:        Author: Ezzard Holms, MD 03/24/2024 3:50 PM  For on call review [email or web address].

## 2024-03-25 ENCOUNTER — Other Ambulatory Visit

## 2024-03-25 ENCOUNTER — Other Ambulatory Visit: Payer: Self-pay

## 2024-03-25 DIAGNOSIS — I5022 Chronic systolic (congestive) heart failure: Secondary | ICD-10-CM

## 2024-03-25 DIAGNOSIS — Z4502 Encounter for adjustment and management of automatic implantable cardiac defibrillator: Secondary | ICD-10-CM | POA: Diagnosis not present

## 2024-03-25 LAB — CBC WITH DIFFERENTIAL/PLATELET
Abs Immature Granulocytes: 0.03 10*3/uL (ref 0.00–0.07)
Basophils Absolute: 0 10*3/uL (ref 0.0–0.1)
Basophils Relative: 1 %
Eosinophils Absolute: 0 10*3/uL (ref 0.0–0.5)
Eosinophils Relative: 1 %
HCT: 46.7 % (ref 39.0–52.0)
Hemoglobin: 14.5 g/dL (ref 13.0–17.0)
Immature Granulocytes: 1 %
Lymphocytes Relative: 7 %
Lymphs Abs: 0.4 10*3/uL — ABNORMAL LOW (ref 0.7–4.0)
MCH: 23.5 pg — ABNORMAL LOW (ref 26.0–34.0)
MCHC: 31 g/dL (ref 30.0–36.0)
MCV: 75.8 fL — ABNORMAL LOW (ref 80.0–100.0)
Monocytes Absolute: 0.7 10*3/uL (ref 0.1–1.0)
Monocytes Relative: 14 %
Neutro Abs: 4 10*3/uL (ref 1.7–7.7)
Neutrophils Relative %: 76 %
Platelets: 269 10*3/uL (ref 150–400)
RBC: 6.16 MIL/uL — ABNORMAL HIGH (ref 4.22–5.81)
RDW: 18.5 % — ABNORMAL HIGH (ref 11.5–15.5)
WBC: 5.2 10*3/uL (ref 4.0–10.5)
nRBC: 0 % (ref 0.0–0.2)

## 2024-03-25 LAB — BASIC METABOLIC PANEL WITH GFR
Anion gap: 8 (ref 5–15)
BUN: 43 mg/dL — ABNORMAL HIGH (ref 6–20)
CO2: 34 mmol/L — ABNORMAL HIGH (ref 22–32)
Calcium: 9.4 mg/dL (ref 8.9–10.3)
Chloride: 96 mmol/L — ABNORMAL LOW (ref 98–111)
Creatinine, Ser: 2.32 mg/dL — ABNORMAL HIGH (ref 0.61–1.24)
GFR, Estimated: 36 mL/min — ABNORMAL LOW (ref >60)
Glucose, Bld: 94 mg/dL (ref 70–99)
Potassium: 2.9 mmol/L — ABNORMAL LOW (ref 3.5–5.1)
Sodium: 138 mmol/L (ref 135–145)

## 2024-03-25 LAB — GLUCOSE, CAPILLARY: Glucose-Capillary: 120 mg/dL — ABNORMAL HIGH (ref 70–99)

## 2024-03-25 MED ORDER — POTASSIUM CHLORIDE 10 MEQ/100ML IV SOLN
10.0000 meq | INTRAVENOUS | Status: AC
  Administered 2024-03-25 (×5): 10 meq via INTRAVENOUS
  Filled 2024-03-25 (×5): qty 100

## 2024-03-25 MED ORDER — SPIRONOLACTONE 25 MG PO TABS
100.0000 mg | ORAL_TABLET | Freq: Every day | ORAL | Status: DC
  Administered 2024-03-26 – 2024-03-30 (×5): 100 mg via ORAL
  Filled 2024-03-25 (×5): qty 4

## 2024-03-25 MED ORDER — POTASSIUM CHLORIDE CRYS ER 20 MEQ PO TBCR
40.0000 meq | EXTENDED_RELEASE_TABLET | Freq: Once | ORAL | Status: AC
  Administered 2024-03-25: 40 meq via ORAL
  Filled 2024-03-25: qty 2

## 2024-03-25 MED ORDER — METOPROLOL SUCCINATE ER 25 MG PO TB24
25.0000 mg | ORAL_TABLET | Freq: Every day | ORAL | Status: DC
  Administered 2024-03-25 – 2024-03-30 (×6): 25 mg via ORAL
  Filled 2024-03-25 (×6): qty 1

## 2024-03-25 MED ORDER — POTASSIUM CHLORIDE CRYS ER 20 MEQ PO TBCR
60.0000 meq | EXTENDED_RELEASE_TABLET | Freq: Two times a day (BID) | ORAL | Status: DC
  Administered 2024-03-25 – 2024-03-26 (×3): 60 meq via ORAL
  Filled 2024-03-25 (×3): qty 3

## 2024-03-25 NOTE — TOC Initial Note (Signed)
 Transition of Care [facility]) - Initial/Assessment Note    Patient Details  Name: Bradley Powell MRN: [id number] Date of Birth: 1981/03/07  Transition [facility] CM/SW Contact:    Odilia Bennett, LCSW Phone Number: 03/25/2024, 11:08 AM  Clinical Narrative: Readmission prevention screen complete. CSW met with patient. No family at bedside. CSW introduced role and explained that discharge planning would be discussed. PCP is Merl Star, MD. Patient mostly drives himself to appointments but asks friends/family when needed. He has been having recent issues with [facility] so he would like his medications sent to [facility] [address] at discharge. No home health or DME use prior to admission. No further concerns. CSW will continue to follow patient for support and facilitate return home once stable. He will call family/friends to pick him up at discharge.                 Expected Discharge Plan: Home/Self Care Barriers to Discharge: Continued Medical Work up   Patient Goals and CMS Choice            Expected Discharge Plan and Services     Post Acute Care Choice: NA Living arrangements for the past 2 months: Single Family Home                                      Prior Living Arrangements/Services Living arrangements for the past 2 months: Single Family Home Lives with:: Relatives Patient language and need for interpreter reviewed:: Yes Do you feel safe going back to the place where you live?: Yes      Need for Family Participation in Patient Care: Yes (Comment)     Criminal Activity/Legal Involvement Pertinent to Current Situation/Hospitalization: No - Comment as needed  Activities of Daily Living   ADL Screening (condition at time of admission) Independently performs ADLs?: Yes (appropriate for developmental age) Is the patient deaf or have difficulty hearing?: No Does the patient have difficulty seeing, even when wearing glasses/contacts?: No Does  the patient have difficulty concentrating, remembering, or making decisions?: No  Permission Sought/Granted                  Emotional Assessment Appearance:: Appears stated age Attitude/Demeanor/Rapport: Engaged Affect (typically observed): Accepting, Pleasant Orientation: : Oriented to Self, Oriented to Place, Oriented to  Time, Oriented to Situation Alcohol / Substance Use: Not Applicable Psych Involvement: No (comment)  Admission diagnosis:  Hypokalemia [E87.6] AICD discharge [Z45.02] Syncope, unspecified syncope type [R55] Acute hypokalemia [E87.6] Patient Active Problem List   Diagnosis Date Noted   Acute hypokalemia 03/24/2024   AICD discharge 03/21/2024   Elevated troponin 03/21/2024   CKD (chronic kidney disease), stage III (HCC) 03/21/2024   VT (ventricular tachycardia) (HCC) 03/14/2024   Syncope 02/17/2024   Hypokalemia 11/21/2023   Acute kidney injury superimposed on chronic kidney disease (HCC) 10/13/2023   Leukocytosis 10/13/2023   Hypothyroidism 10/13/2023   Iron deficiency 10/13/2023   Acute on chronic congestive heart failure (HCC) 09/30/2023   Fluid overload 09/29/2023   Ventricular tachycardia (HCC) 05/26/2022   ICD (implantable cardioverter-defibrillator) in place 05/26/2022   Syncope and collapse 02/15/2022   Acute on chronic systolic (congestive) heart failure (HCC) 02/15/2022   Hepatic cirrhosis (HCC) 06/16/2020   Dehydration with hyponatremia 06/16/2020   Hypotension due to hypovolemia 06/16/2020   Intractable nausea and vomiting 06/16/2020   Acute kidney injury (HCC) 06/15/2020  Atrial fibrillation (HCC) 04/29/2020   Chronic combined systolic and diastolic congestive heart failure (HCC) 04/07/2018   NICM (nonischemic cardiomyopathy) (HCC) 04/07/2018   Hyperglycemia 04/07/2018   Typical atrial flutter (HCC) 04/06/2018   Paroxysmal atrial flutter (HCC) 04/06/2018   Gout 03/30/2017   Suspected sleep apnea 03/01/2017   Edema 02/24/2017    Testicular swelling 02/24/2017   CHF (congestive heart failure) (HCC) 02/24/2017   Overweight 02/24/2017   Costochondritis 02/24/2017   Sinus tachycardia 02/24/2017   First degree AV block 02/24/2017   Scrotal edema 02/24/2017   Obesity, class 2 02/24/2017   PCP:  Merl Star, MD Pharmacy:   [facility] [address] [place] [address] Phone: [phone] Fax: [phone]     Social Drivers of Health (SDOH) Social History: SDOH Screenings   Food Insecurity: No Food Insecurity (03/21/2024)  Housing: Low Risk  (03/21/2024)  Transportation Needs: No Transportation Needs (03/21/2024)  Utilities: Not At Risk (03/21/2024)  Social Connections: Moderately Isolated (03/14/2024)  Tobacco Use: Low Risk  (03/21/2024)   SDOH Interventions:     Readmission Risk Interventions    03/25/2024   11:07 AM 02/20/2024   12:41 PM 11/30/2023    2:37 PM  Readmission Risk Prevention Plan  Transportation Screening Complete Complete Complete  PCP or Specialist Appt within 5-7 Days   Complete  PCP or Specialist Appt within 3-5 Days Complete Complete   Home Care Screening   Complete  Medication Review (RN CM)   Complete  HRI or Home Care Consult  Complete   Social Work Consult for Recovery Care Planning/Counseling Complete    Palliative Care Screening Not Applicable Not Applicable   Medication Review Oceanographer) Complete Complete

## 2024-03-25 NOTE — Progress Notes (Signed)
 Progress Note   Patient: Bradley Powell ZOX:[id number] DOB: 19-Feb-1981 DOA: 03/21/2024     1 DOS: the patient was seen and examined on 03/25/2024     Brief hospital course: From HPI "[name] is a 43 y.o. male with medical history significant of recurrent VT, chronic combined CHF, AFL, CKD, issues with non-compliance and  hypokalemia presenting with syncope and hypokalemia.  Patient reports getting symptoms to drink overnight when patient felt generalized malaise and weakness.  Had a witnessed syncopal episode at home.  Patient denies any chest pain or nausea prior to the event.  Noted baseline history of similar episodes associated with hypokalemia as well as recurrent V. tach.  Patient with ICD in place.  Patient states he did feel like it possibly discharge.  Noted recent admission May 1 to May 3 on the cardiology EP service with issues concerning for ICD shocking as well as V. Tach and hypokalemia.  Symptoms felt to be predominantly associated with hypokalemia.  Had repletion with resolution of symptoms.  Patient states he had a recheck of his potassium yesterday with a potassium of 5.1.  Patient states he only took half his dose later in the day above his regular time.  Subsequently had episode of syncope.  No focal hemiparesis or confusion.  Patient reports compliance with medication regimen at present including apixaban , torsemide  and spironolactone . Presented to the ER afebrile, blood pressures 80s to 100s.  Satting well on room air.  White count 8.2, hemoglobin 13.4, platelets 256.  Troponin 70s.  Potassium 3.1.  Creatinine 1.99.  CT head, CT C-spine within normal limits.  Chest x-ray with mild vascular congestion.  Pacemaker interrogation with noted shock therapy at 325 today.  Also with antitachycardia pacing.  "   Assessment and Plan: Syncope Recurrent syncopal event in setting of cardiac pacemaker and symptomatic hypokalemia Noted admission on the cardiology EP service earlier  this month with similar issues including recurrent V. tach and hypokalemia Potassium level low despite aggressive replacement I have discussed with cardiologist as well as nephrologist and we suspect patient may be having underlying RTA According to cardiologist since patient is high risk of arrhythmia he cannot be discharged until his potassium level stabilizes with initiation of diuretic therapy Continue aggressive potassium repletion   Persistent hypokalemia despite repletion Patient still having persistent hypokalemia despite aggressive repletion therapy Continue potassium replacement and monitoring   Atrial fibrillation, chronic (HCC) Rate controlled at present Continue home amiodarone  and Eliquis  Monitor   CKD (chronic kidney disease), stage III (HCC) Appears to be near baseline Will formally consult nephrology in the setting recurrent hypokalemia Monitor   Elevated troponin Troponin 70s in the setting of syncopal event No chest pains Monitor on telemetry closely   ICD (implantable cardioverter-defibrillator) in place Status post ICD placement in the setting of AV block Formally interrogated in the ER with noted shock therapy at 325 this morning as well as antitachycardia pacing      NICM (nonischemic cardiomyopathy) (HCC) 2D echo 11/03/2023 with EF 35 to 40% Appears euvolemic at present Monitor    Advance Care Planning:   Code Status: Full Code    Consults: Nephrology   Family Communication: No family at the bedside        Subjective:  Patient seen and examined at bedside this morning Patient still losing a lot of potassium despite repletion Denies nausea vomiting or chest pain   Physical Exam: Head: Normocephalic and atraumatic.     Nose: Nose normal.  Mouth/Throat:     Mouth: Mucous membranes are moist.  Eyes:     Pupils: Pupils are equal, round, and reactive to light.  Cardiovascular:     Rate and Rhythm: Normal rate and regular rhythm.   Pulmonary:     Effort: Pulmonary effort is normal.  Abdominal:     General: Bowel sounds are normal.  Musculoskeletal:        General: Normal range of motion.  Skin:    General: Skin is warm.  Neurological:     General: No focal deficit present.  Psychiatric:        Mood and Affect: Mood normal.    Data reviewed   Vitals:   03/25/24 0536 03/25/24 0714 03/25/24 1139 03/25/24 1525  BP:  97/62 104/66 103/69  Pulse:  68 72 72  Resp:  18 20 19   Temp:  98.3 F (36.8 C) 98.3 F (36.8 C) 97.7 F (36.5 C)  TempSrc:      SpO2:  100% 99% 100%  Weight: 107.9 kg     Height:          Latest Ref Rng & Units 03/25/2024    6:02 AM 03/24/2024    4:34 AM 03/23/2024    3:24 AM  CBC  WBC 4.0 - 10.5 K/uL 5.2  5.8  6.6   Hemoglobin 13.0 - 17.0 g/dL 09.6  04.5  40.9   Hematocrit 39.0 - 52.0 % 46.7  47.3  46.5   Platelets 150 - 400 K/uL 269  275  295        Latest Ref Rng & Units 03/25/2024    6:02 AM 03/24/2024    4:34 AM 03/23/2024    3:24 AM  BMP  Glucose 70 - 99 mg/dL 94  811  914   BUN 6 - 20 mg/dL 43  44  41   Creatinine 0.61 - 1.24 mg/dL 7.82  9.56  2.13   Sodium 135 - 145 mmol/L 138  135  136   Potassium 3.5 - 5.1 mmol/L 2.9  3.1  4.1   Chloride 98 - 111 mmol/L 96  93  97   CO2 22 - 32 mmol/L 34  29  28   Calcium  8.9 - 10.3 mg/dL 9.4  9.2  8.9      Author: Ezzard Holms, MD 03/25/2024 4:41 PM  For on call review [email or web address].

## 2024-03-25 NOTE — Progress Notes (Signed)
 [facility]  ROUNDING NOTE   Subjective:   Patient seen sitting at side of bed Voices his frustration over care but understands it's a process Room air Trace lower extremity edema  Potassium 2.9  Objective:  Vital signs in last 24 hours:  Temp:  [97.6 F (36.4 C)-98.5 F (36.9 C)] 97.7 F (36.5 C) (05/12 1525) Pulse Rate:  [66-74] 72 (05/12 1525) Resp:  [18-20] 19 (05/12 1525) BP: (97-106)/(62-72) 103/69 (05/12 1525) SpO2:  [99 %-100 %] 100 % (05/12 1525) Weight:  [107.9 kg] 107.9 kg (05/12 0536)  Weight change: -0.056 kg Filed Weights   03/23/24 0348 03/24/24 0516 03/25/24 0536  Weight: 110.9 kg 108 kg 107.9 kg    Intake/Output: I/O last 3 completed shifts: In: 1483 [P.O.:1080; I.V.:3; IV Piggyback:400] Out: -    Intake/Output this shift:  Total I/O In: 120 [P.O.:120] Out: -   Physical Exam: General: NAD,   Head: Normocephalic, atraumatic. Moist oral mucosal membranes  Eyes: Anicteric  Lungs:  Clear to auscultation, normal effort  Heart: Regular rate and rhythm  Abdomen:  Soft, nontender,   Extremities: Trace peripheral edema.  Neurologic: Alert, moving all four extremities  Skin: No lesions  Access: None    Basic Metabolic Panel: Recent Labs  Lab 03/21/24 0441 03/22/24 0653 03/22/24 1442 03/23/24 0324 03/24/24 0434 03/25/24 0602  NA 134* 133*  --  136 135 138  K 3.1* 3.3* 3.4* 4.1 3.1* 2.9*  CL 95* 101  --  97* 93* 96*  CO2 26 24  --  28 29 34*  GLUCOSE 107* 106*  --  118* 175* 94  BUN 39* 36*  --  41* 44* 43*  CREATININE 1.99* 1.84*  --  1.83* 2.37* 2.32*  CALCIUM  8.9 8.9  --  8.9 9.2 9.4  MG 2.0  --   --   --   --   --     Liver Function Tests: Recent Labs  Lab 03/22/24 0653  AST 24  ALT 31  ALKPHOS 102  BILITOT 1.9*  PROT 6.3*  ALBUMIN 2.9*   No results for input(s): "LIPASE", "AMYLASE" in the last 168 hours. No results for input(s): "AMMONIA" in the last 168 hours.  CBC: Recent Labs  Lab 03/21/24 0441  03/22/24 0653 03/23/24 0324 03/24/24 0434 03/25/24 0602  WBC 8.2 6.0 6.6 5.8 5.2  NEUTROABS 6.7  --  5.5 4.6 4.0  HGB 13.4 13.4 14.4 14.6 14.5  HCT 43.4 44.2 46.5 47.3 46.7  MCV 77.6* 77.5* 77.1* 77.2* 75.8*  PLT 256 240 295 275 269    Cardiac Enzymes: No results for input(s): "CKTOTAL", "CKMB", "CKMBINDEX", "TROPONINI" in the last 168 hours.  BNP: Invalid input(s): "POCBNP"  CBG: Recent Labs  Lab 03/25/24 0511  GLUCAP 120*    Microbiology: Results for orders placed or performed during the hospital encounter of 09/29/23  Culture, blood (Routine X 2) w Reflex to ID Panel     Status: None   Collection Time: 10/05/23 12:02 AM   Specimen: BLOOD RIGHT ARM  Result Value Ref Range Status   Specimen Description BLOOD RIGHT ARM  Final   Special Requests   Final    BOTTLES DRAWN AEROBIC AND ANAEROBIC Blood Culture results may not be optimal due to an excessive volume of blood received in culture bottles   Culture   Final    NO GROWTH 5 DAYS Performed at [facility], [address] [place], [place] [address]    Report Status 10/10/2023  FINAL  Final  Culture, blood (Routine X 2) w Reflex to ID Panel     Status: None   Collection Time: 10/05/23 12:05 AM   Specimen: BLOOD LEFT ARM  Result Value Ref Range Status   Specimen Description BLOOD LEFT ARM  Final   Special Requests   Final    BOTTLES DRAWN AEROBIC AND ANAEROBIC Blood Culture results may not be optimal due to an excessive volume of blood received in culture bottles   Culture   Final    NO GROWTH 5 DAYS Performed at [facility], [address], [place], [place] [address]    Report Status 10/10/2023 FINAL  Final  Urine Culture (for pregnant, neutropenic or urologic patients or patients with an indwelling urinary catheter)     Status: Abnormal   Collection Time: 10/05/23  5:42 AM   Specimen: Urine, Clean Catch  Result Value Ref Range Status   Specimen Description URINE, CLEAN CATCH  Final   Special  Requests NONE  Final   Culture (A)  Final    <10,000 COLONIES/mL INSIGNIFICANT GROWTH Performed at [facility], [address], [place], [place] [address]    Report Status 10/06/2023 FINAL  Final  Respiratory (~20 pathogens) panel by PCR     Status: None   Collection Time: 10/05/23 12:02 PM   Specimen: Nasopharyngeal Swab; Respiratory  Result Value Ref Range Status   Adenovirus NOT DETECTED NOT DETECTED Final   Coronavirus 229E NOT DETECTED NOT DETECTED Final    Comment: (NOTE) The Coronavirus on the Respiratory Panel, DOES NOT test for the novel  Coronavirus (2019 nCoV)    Coronavirus HKU1 NOT DETECTED NOT DETECTED Final   Coronavirus NL63 NOT DETECTED NOT DETECTED Final   Coronavirus OC43 NOT DETECTED NOT DETECTED Final   Metapneumovirus NOT DETECTED NOT DETECTED Final   Rhinovirus / Enterovirus NOT DETECTED NOT DETECTED Final   Influenza A NOT DETECTED NOT DETECTED Final   Influenza B NOT DETECTED NOT DETECTED Final   Parainfluenza Virus 1 NOT DETECTED NOT DETECTED Final   Parainfluenza Virus 2 NOT DETECTED NOT DETECTED Final   Parainfluenza Virus 3 NOT DETECTED NOT DETECTED Final   Parainfluenza Virus 4 NOT DETECTED NOT DETECTED Final   Respiratory Syncytial Virus NOT DETECTED NOT DETECTED Final   Bordetella pertussis NOT DETECTED NOT DETECTED Final   Bordetella Parapertussis NOT DETECTED NOT DETECTED Final   Chlamydophila pneumoniae NOT DETECTED NOT DETECTED Final   Mycoplasma pneumoniae NOT DETECTED NOT DETECTED Final    Comment: Performed at [facility], [address], [place], [place] [address]    Coagulation Studies: No results for input(s): "LABPROT", "INR" in the last 72 hours.  Urinalysis: No results for input(s): "COLORURINE", "LABSPEC", "PHURINE", "GLUCOSEU", "HGBUR", "BILIRUBINUR", "KETONESUR", "PROTEINUR", "UROBILINOGEN", "NITRITE", "LEUKOCYTESUR" in the last 72 hours.  Invalid input(s): "APPERANCEUR"    Imaging: No results  found.   Medications:     allopurinol   100 mg Oral Daily   amiodarone   200 mg Oral BID   apixaban   5 mg Oral BID   aspirin   81 mg Oral Daily   atorvastatin   40 mg Oral Daily   empagliflozin   10 mg Oral Daily   metoprolol  succinate  25 mg Oral Daily   mexiletine  150 mg Oral Q12H   pantoprazole   40 mg Oral Daily   potassium chloride   60 mEq Oral BID   sodium chloride  flush  3 mL Intravenous Q12H   spironolactone   50 mg  Oral Daily   torsemide   60 mg Oral BID   acetaminophen , ondansetron  **OR** ondansetron  (ZOFRAN ) IV  Assessment/ Plan:  [name] is a 43 y.o.  male with past medical conditions including chronic combined CHF, CKD, AFL, defibrillator, who was admitted to [facility] on 03/21/2024 for Hypokalemia [E87.6] AICD discharge [Z45.02] Syncope, unspecified syncope type [R55]    Chronic kidney disease stage IIIb, baseline GFR appears to be 37-38.  GFR currently 47.  Upcoming appt with Dr Lydia Sams at [facility].  Renal function remained stable.    2. Hypokalemia due to insufficient supplementation.  Home regimen includes potassium chloride  80 mill equivalents twice a day.  Patient currently prescribed potassium chloride  60mEq twice daily, Appreciate consulting cardiology to discuss home regimen.  Patient currently prescribed torsemide  60 mg twice daily.  Potassium 2.9 today, primary team has ordered 40 mill equivalents of IV supplementation.  Can increase spironolactone  to 100 mg daily.    LOS: 1 Bradley Powell 5/12/20253:49 PM

## 2024-03-25 NOTE — Consult Note (Addendum)
 Advanced Heart Failure Team Consult Note   Primary Physician: Merl Star, MD Cardiologist:  None  Reason for Consultation: VT, chronic HFrEF  HPI:    Bradley Powell is seen today for evaluation of VT and chronic HFrEF at the request of Dr. Jerelene Monday with [facility].   Bradley Powell is a 43 y.o. male with a history of combined systolic/diastolic heart failure, obesity, HTN, and gout.    AFL ablation on 5/19. Subsequently had marked 1st AV block with progression to junctional rhythm. Saw Dr. Carolynne Citron who wanted to follow it. Suggested cutting back carvedilol .   cMRI 7/21 LVEF 45% suspect infiltrative CM. ECV 55%   Admitted 4/23 with VT. Had massive volume overload on exam. Had recurrent sustained VT w/ rates 200-210, converted with IV amio and moved to ICU. Echo EF 35-40% RV modHK. Diuresed over 50 pounds with IV lasix . Underwent [employer] ICD implant. IV amio switched to po (already on mexilitene for PVCs). 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   Multiple admissions over the last year with volume overload requiring IV diuresis.  Readmitted 1/25 with VT storm in setting of hypokalemia and recurrent volume overload. Loaded amio.    Admitted 4/5-4/10 for syncope in the setting of hypokalemia and VT treated w/ ICD therapy. This was after running out of spironolactone .   Readmitted 05/01-05/02 after received an ICD shock at work in setting of hypokalemia.  K aggressively supplemented and device reprogrammed for longer ATP prior to shocks.  Had lab appointment 05/07 and K was 5.1. He decided to hold some of his potassium supplementation.  Presented to ED again on 05/08 after a syncopal episode at home d/t VT. This was in setting of recurrent hypokalemia. EP reviewed his device interrogation. Had several rounds of ATP followed by high-voltage therapy X 1. Device reprogrammed by EP.  Diuretics initially held and potassium aggressively repleted, home spiro was added back on  05/10. Potassium lower today after received metolazone  on 05/10. Advanced Heart Failure asked to see to assist with management of chronic HrEF.    Home Medications Prior to Admission medications   Medication Sig Start Date End Date Taking? Authorizing Provider  allopurinol  (ZYLOPRIM ) 100 MG tablet Take 1 tablet (100 mg total) by mouth daily. Please contact PCP for further refills 10/24/23  Yes Deforest Fast, MD  amiodarone  (PACERONE ) 200 MG tablet Take 1 tablet (200 mg total) by mouth daily. Patient taking differently: Take 200 mg by mouth 2 (two) times daily. 01/28/24  Yes   apixaban  (ELIQUIS ) 5 MG TABS tablet TAKE 1 TABLET BY MOUTH TWICE A DAY 08/17/23  Yes Bensimhon, Rheta Celestine, MD  atorvastatin  (LIPITOR) 40 MG tablet Take 1 tablet (40 mg total) by mouth daily. 03/07/24  Yes Ruddy Corral M, PA-C  empagliflozin  (JARDIANCE ) 10 MG TABS tablet Take 1 tablet (10 mg total) by mouth daily. 12/28/23  Yes Bensimhon, Rheta Celestine, MD  esomeprazole  (NEXIUM ) 40 MG capsule Take 1 capsule (40 mg total) by mouth 2 (two) times daily. 02/23/24  Yes Brahmbhatt, Parag, MD  levothyroxine  (SYNTHROID ) 50 MCG tablet TAKE 1 TABLET (50 MCG TOTAL) BY MOUTH DAILY BEFORE BREAKFAST. TAKE 1 HOUR BEFORE BREAKFAST Patient taking differently: Take 50 mcg by mouth daily before breakfast. 10/17/23  Yes Milford, Arlice Bene, FNP  metolazone  (ZAROXOLYN ) 5 MG tablet Take 1 tablet (5 mg total) by mouth 2 (two) times a week every Sunday and Wednesday. 12/25/23  Yes Bensimhon, Rheta Celestine, MD  mexiletine (MEXITIL ) 150 MG capsule Take  1 capsule (150 mg total) by mouth every 12 (twelve) hours. 08/09/23  Yes Milford, Arlice Bene, FNP  polyethylene glycol powder (GLYCOLAX /MIRALAX ) 17 GM/SCOOP powder Take 1 CAPFUL (17 g) by mouth daily as needed. Patient taking differently: Take 17 g by mouth daily as needed for mild constipation or moderate constipation. 10/24/23  Yes Deforest Fast, MD  potassium chloride  SA (KLOR-CON  M) 20 MEQ tablet Take 4  tablets (80 mEq total) by mouth 2 (two) times daily. And 2 tablets at night Patient taking differently: Take 60-80 mEq by mouth See admin instructions. Take 80 meq in the morning and at bedtime. Take 60 meq in the afternoon 03/11/24  Yes Simmons, Brittainy M, PA-C  spironolactone  (ALDACTONE ) 50 MG tablet Take 1 tablet (50 mg total) by mouth daily. 02/22/24  Yes Tylene Galla, PA-C  torsemide  (DEMADEX ) 20 MG tablet Take 4 tablets (80mg )  in the morning, and 3 tablets (60mg ) in the afternoon 11/30/23  Yes Deforest Fast, MD  diclofenac  Sodium (VOLTAREN ) 1 % GEL Apply 1 Application topically daily as needed (pain).    [provider]    Past Medical History: Past Medical History:  Diagnosis Date   Atrial flutter (HCC)    a. s/p ablation 03/2018.   Chronic combined systolic and diastolic CHF (congestive heart failure) (HCC)    History of esophagogastroduodenoscopy (EGD)    Morbid obesity (HCC)    NICM (nonischemic cardiomyopathy) (HCC)    OSA on CPAP    mild OSA with an AHI of 7.7/hr on auto CPAP   PVC's (premature ventricular contractions)     Past Surgical History: Past Surgical History:  Procedure Laterality Date   A-FLUTTER ABLATION N/A 04/06/2018   Procedure: A-FLUTTER ABLATION;  Surgeon: Tammie Fall, MD;  Location: [facility] INVASIVE CV LAB;  Service: Cardiovascular;  Laterality: N/A;   BIOPSY  07/11/2023   Procedure: BIOPSY;  Surgeon: Felecia Hopper, MD;  Location: [facility] ENDOSCOPY;  Service: Gastroenterology;;   ESOPHAGEAL BRUSHING  06/19/2020   Procedure: ESOPHAGEAL BRUSHING;  Surgeon: Baldo Bonds, MD;  Location: [facility] ENDOSCOPY;  Service: Endoscopy;;   ESOPHAGOGASTRODUODENOSCOPY (EGD) WITH PROPOFOL  Left 06/19/2020   Procedure: ESOPHAGOGASTRODUODENOSCOPY (EGD) WITH PROPOFOL ;  Surgeon: Baldo Bonds, MD;  Location: [facility] ENDOSCOPY;  Service: Endoscopy;  Laterality: Left;   ESOPHAGOGASTRODUODENOSCOPY (EGD) WITH PROPOFOL  N/A 07/11/2023   Procedure:  ESOPHAGOGASTRODUODENOSCOPY (EGD) WITH PROPOFOL ;  Surgeon: Felecia Hopper, MD;  Location: [facility] ENDOSCOPY;  Service: Gastroenterology;  Laterality: N/A;   ICD IMPLANT N/A 02/22/2022   Procedure: ICD IMPLANT;  Surgeon: Tammie Fall, MD;  Location: [facility] INVASIVE CV LAB;  Service: Cardiovascular;  Laterality: N/A;   RIGHT HEART CATH N/A 12/20/2019   Procedure: RIGHT HEART CATH;  Surgeon: Mardell Shade, MD;  Location: [facility] INVASIVE CV LAB;  Service: Cardiovascular;  Laterality: N/A;   RIGHT/LEFT HEART CATH AND CORONARY ANGIOGRAPHY N/A 03/01/2017   Procedure: Right/Left Heart Cath and Coronary Angiography;  Surgeon: Mardell Shade, MD;  Location: [facility] INVASIVE CV LAB;  Service: Cardiovascular;  Laterality: N/A;    Family History: Family History  Problem Relation Age of Onset   CVA Mother        pacemaker   Multiple sclerosis Mother    Seizures Mother    Hypertension Father    Gout Father     Social History: Social History   Socioeconomic History   Marital status: Single    Spouse name: Not on file   Number of children: Not on file   Years of education: Not on  file   Highest education level: Not on file  Occupational History   Not on file  Tobacco Use   Smoking status: Never   Smokeless tobacco: Never  Vaping Use   Vaping status: Never Used  Substance and Sexual Activity   Alcohol use: Yes    Comment: rarely   Drug use: No   Sexual activity: Not Currently    Partners: Female  Other Topics Concern   Not on file  Social History Narrative   Not on file   Social Drivers of Health   Financial Resource Strain: Not on file  Food Insecurity: No Food Insecurity (03/21/2024)   Hunger Vital Sign    Worried About Running Out of Food in the Last Year: Never true    Ran Out of Food in the Last Year: Never true  Transportation Needs: No Transportation Needs (03/21/2024)   PRAPARE - Administrator, Civil Service (Medical): No    Lack of Transportation (Non-Medical): No   Physical Activity: Not on file  Stress: Not on file  Social Connections: Moderately Isolated (03/14/2024)   Social Connection and Isolation Panel [NHANES]    Frequency of Communication with Friends and Family: More than three times a week    Frequency of Social Gatherings with Friends and Family: More than three times a week    Attends Religious Services: More than 4 times per year    Active Member of [employer] or Organizations: No    Attends Banker Meetings: Never    Marital Status: Never married    Allergies:  Allergies  Allergen Reactions   Entresto  [Sacubitril -Valsartan ] Nausea And Vomiting and Other (See Comments)    Lightheaded, fainting    Objective:    Vital Signs:   Temp:  [97.6 F (36.4 C)-98.5 F (36.9 C)] 98.3 F (36.8 C) (05/12 0714) Pulse Rate:  [66-76] 68 (05/12 0714) Resp:  [18] 18 (05/12 0714) BP: (97-106)/(62-77) 97/62 (05/12 0714) SpO2:  [94 %-100 %] 100 % (05/12 0714) Weight:  [107.9 kg] 107.9 kg (05/12 0536) Last BM Date : 03/24/24  Weight change: Filed Weights   03/23/24 0348 03/24/24 0516 03/25/24 0536  Weight: 110.9 kg 108 kg 107.9 kg    Intake/Output:   Intake/Output Summary (Last 24 hours) at 03/25/2024 0856 Last data filed at 03/25/2024 0600 Gross per 24 hour  Intake 1240 ml  Output --  Net 1240 ml      Physical Exam    General:  Well appearing.  Neck: No JVD Cor: Regular rate & rhythm. No rubs, gallops or murmurs. Lungs: clear Abdomen: soft, nontender, nondistended.  Extremities: no edema, compression stockings are on Neuro: alert & orientedx3. Affect pleasant   Telemetry   SR 70s, 1-2 PVCs/min  Labs   Basic Metabolic Panel: Recent Labs  Lab 03/21/24 0441 03/22/24 0653 03/22/24 1442 03/23/24 0324 03/24/24 0434 03/25/24 0602  NA 134* 133*  --  136 135 138  K 3.1* 3.3* 3.4* 4.1 3.1* 2.9*  CL 95* 101  --  97* 93* 96*  CO2 26 24  --  28 29 34*  GLUCOSE 107* 106*  --  118* 175* 94  BUN 39* 36*  --  41* 44*  43*  CREATININE 1.99* 1.84*  --  1.83* 2.37* 2.32*  CALCIUM  8.9 8.9  --  8.9 9.2 9.4  MG 2.0  --   --   --   --   --     Liver Function Tests: Recent Labs  Lab 03/22/24 0653  AST 24  ALT 31  ALKPHOS 102  BILITOT 1.9*  PROT 6.3*  ALBUMIN 2.9*   No results for input(s): "LIPASE", "AMYLASE" in the last 168 hours. No results for input(s): "AMMONIA" in the last 168 hours.  CBC: Recent Labs  Lab 03/21/24 0441 03/22/24 0653 03/23/24 0324 03/24/24 0434 03/25/24 0602  WBC 8.2 6.0 6.6 5.8 5.2  NEUTROABS 6.7  --  5.5 4.6 4.0  HGB 13.4 13.4 14.4 14.6 14.5  HCT 43.4 44.2 46.5 47.3 46.7  MCV 77.6* 77.5* 77.1* 77.2* 75.8*  PLT 256 240 295 275 269    Cardiac Enzymes: No results for input(s): "CKTOTAL", "CKMB", "CKMBINDEX", "TROPONINI" in the last 168 hours.  BNP: BNP (last 3 results) Recent Labs    12/19/23 1511 01/02/24 1431 02/17/24 1938  BNP 256.2* 294.8* 304.9*    ProBNP (last 3 results) No results for input(s): "PROBNP" in the last 8760 hours.   CBG: Recent Labs  Lab 03/25/24 0511  GLUCAP 120*    Coagulation Studies: No results for input(s): "LABPROT", "INR" in the last 72 hours.   Imaging   No results found.   Medications:     Current Medications:  allopurinol   100 mg Oral Daily   amiodarone   200 mg Oral BID   apixaban   5 mg Oral BID   aspirin   81 mg Oral Daily   atorvastatin   40 mg Oral Daily   empagliflozin   10 mg Oral Daily   mexiletine  150 mg Oral Q12H   pantoprazole   40 mg Oral Daily   potassium chloride   60 mEq Oral BID   sodium chloride  flush  3 mL Intravenous Q12H   spironolactone   50 mg Oral Daily   torsemide   60 mg Oral BID    Infusions:  potassium chloride         Patient Profile   43 y.o. male with history of chronic HFrEF, NICM s/p ICD, VT, AF/AFL, OSA, obesity, CKD IIIb. Admitted with syncope in setting of VT s/p ICD shock.  Assessment/Plan  Syncope VT  - s/p ICD 4/23. - VT storm 1/25 in setting of hypokalemia -  Admitted again 02/17/24 with VT treated with shock in the setting of hypokalemia (he was out of spirolactone) - Admitted 05/01 with VT treated with shock (hypokalemia again noted) - Now admitted with syncope and VT s/p ATP and shock in setting of hypokalemia. EP has seen. Device reprogrammed.  - Will need outpatient cardiac PET given concern for sarcoid on prior MRI - Continue mexiletine 150 mg bid   - Continue amiodarone  200 bid.  - Add low dose toprol  xl  Hypokalemia - K 2.9 today - PO K supp and IV runs today - Home spironolactone  (50 mg daily) restarted on 5/10. Depending on tomorrow's labs, may increase to 100 mg daily -Nephrology consulted to see if potassium wasting issue - Keep K > 4 and Mag > 2  Chronic HFrEF - PYP (4/22). Ratio 1.26 read as equivocal.  SPEP negative - cMRI (11/22): LVEF 43%, RVEF 44%, LGE concerning for sarcoid.  - cMRI (8/21): EF 45% + infiltrative process suggestive of possible amyloidosis - Echo (4/23): EF 35-40% Moderate RV dysfunction - Echo 12/24 EF 35-40% - Has seen geneticist, Dr. Drexel Gentles. Work up w/ likely pathogenic gene variant (unable to see full report) - Given recurrent VT and concern for sarcoidosis on prior cMRI, EP recommending cardiac PET as outpatient. Also consider device upgrade to CRT-D d/t AV dissociation. - NYHA II  -  Volume okay today. Continue Torsemide  60 BID, takes 80/60 at home - Continue spironolactone  50 mg daily, may increase dose tomorrow as above.  - Continue Jardiance  10 mg daily.  - Off b-blocker with bradycardia/heart block, previously on coreg . Will add low dose toprol  xl 25 mg daily - No ARNi and ARB (hypotension and N/V in the past). - No Bidil  (hypotension in the past)  CKD Stage IIIb - Baseline SCr 2.0-2.3 - Cr at baseline   AF/AFL - S/p AFL ablation 5/19 with Dr. Carolynne Citron. - Continue Amio per above  - Continue Eliquis  5 mg bid.    Obesity  - BMI 36 - Eventually consider GLP-1 RA   Hypothyroidism - likely  amio-induced - continue levothyroxine     Length of Stay: 1  Lilyauna Miedema N, PA-C  03/25/2024, 8:56 AM    Advanced Heart Failure Team Pager [phone] (M-F; 7a - 5p)  Please contact [facility] for night-coverage after hours (4p -7a ) and weekends on [email or web address]

## 2024-03-25 NOTE — Plan of Care (Signed)
   Problem: Education: Goal: Knowledge of General Education information will improve Description: Including pain rating scale, medication(s)/side effects and non-pharmacologic comfort measures Outcome: Progressing   Problem: Health Behavior/Discharge Planning: Goal: Ability to manage health-related needs will improve Outcome: Progressing   Problem: Clinical Measurements: Goal: Diagnostic test results will improve Outcome: Progressing

## 2024-03-26 DIAGNOSIS — N1832 Chronic kidney disease, stage 3b: Secondary | ICD-10-CM

## 2024-03-26 DIAGNOSIS — Z4502 Encounter for adjustment and management of automatic implantable cardiac defibrillator: Secondary | ICD-10-CM | POA: Diagnosis not present

## 2024-03-26 LAB — BASIC METABOLIC PANEL WITH GFR
Anion gap: 10 (ref 5–15)
BUN: 46 mg/dL — ABNORMAL HIGH (ref 6–20)
CO2: 26 mmol/L (ref 22–32)
Calcium: 9.1 mg/dL (ref 8.9–10.3)
Chloride: 95 mmol/L — ABNORMAL LOW (ref 98–111)
Creatinine, Ser: 2.63 mg/dL — ABNORMAL HIGH (ref 0.61–1.24)
GFR, Estimated: 31 mL/min — ABNORMAL LOW (ref >60)
Glucose, Bld: 99 mg/dL (ref 70–99)
Potassium: 3.6 mmol/L (ref 3.5–5.1)
Sodium: 134 mmol/L — ABNORMAL LOW (ref 135–145)

## 2024-03-26 LAB — CBC WITH DIFFERENTIAL/PLATELET
Abs Immature Granulocytes: 0.02 10*3/uL (ref 0.00–0.07)
Basophils Absolute: 0 10*3/uL (ref 0.0–0.1)
Basophils Relative: 1 %
Eosinophils Absolute: 0 10*3/uL (ref 0.0–0.5)
Eosinophils Relative: 1 %
HCT: 46.2 % (ref 39.0–52.0)
Hemoglobin: 14.2 g/dL (ref 13.0–17.0)
Immature Granulocytes: 0 %
Lymphocytes Relative: 8 %
Lymphs Abs: 0.5 10*3/uL — ABNORMAL LOW (ref 0.7–4.0)
MCH: 23.7 pg — ABNORMAL LOW (ref 26.0–34.0)
MCHC: 30.7 g/dL (ref 30.0–36.0)
MCV: 77.3 fL — ABNORMAL LOW (ref 80.0–100.0)
Monocytes Absolute: 0.7 10*3/uL (ref 0.1–1.0)
Monocytes Relative: 13 %
Neutro Abs: 4.4 10*3/uL (ref 1.7–7.7)
Neutrophils Relative %: 77 %
Platelets: 259 10*3/uL (ref 150–400)
RBC: 5.98 MIL/uL — ABNORMAL HIGH (ref 4.22–5.81)
RDW: 18.4 % — ABNORMAL HIGH (ref 11.5–15.5)
WBC: 5.7 10*3/uL (ref 4.0–10.5)
nRBC: 0 % (ref 0.0–0.2)

## 2024-03-26 LAB — MAGNESIUM: Magnesium: 2.3 mg/dL (ref 1.7–2.4)

## 2024-03-26 LAB — GLUCOSE, CAPILLARY: Glucose-Capillary: 102 mg/dL — ABNORMAL HIGH (ref 70–99)

## 2024-03-26 MED ORDER — TORSEMIDE 20 MG PO TABS
60.0000 mg | ORAL_TABLET | Freq: Every day | ORAL | Status: DC
  Administered 2024-03-27: 60 mg via ORAL
  Filled 2024-03-26: qty 3

## 2024-03-26 MED ORDER — POTASSIUM CHLORIDE CRYS ER 20 MEQ PO TBCR
80.0000 meq | EXTENDED_RELEASE_TABLET | Freq: Two times a day (BID) | ORAL | Status: AC
  Administered 2024-03-26: 80 meq via ORAL
  Filled 2024-03-26: qty 4

## 2024-03-26 MED ORDER — LEVOTHYROXINE SODIUM 50 MCG PO TABS
50.0000 ug | ORAL_TABLET | Freq: Every day | ORAL | Status: DC
  Administered 2024-03-27 – 2024-03-30 (×4): 50 ug via ORAL
  Filled 2024-03-26 (×4): qty 1

## 2024-03-26 NOTE — Progress Notes (Signed)
 0135: Pt to take his daily shower. Tele removed and CCMD notified.   0250: Pt finished shower, placed back on telemetry. Pt requested 2 [place] sandwich trays, italian ice and fresh water.

## 2024-03-26 NOTE — Plan of Care (Signed)
  Problem: Education: Goal: Knowledge of General Education information will improve Description: Including pain rating scale, medication(s)/side effects and non-pharmacologic comfort measures Outcome: Progressing   Problem: Health Behavior/Discharge Planning: Goal: Ability to manage health-related needs will improve Outcome: Progressing   Problem: Clinical Measurements: Goal: Cardiovascular complication will be avoided Outcome: Progressing   Problem: Nutrition: Goal: Adequate nutrition will be maintained Outcome: Progressing   Problem: Coping: Goal: Level of anxiety will decrease Outcome: Progressing

## 2024-03-26 NOTE — Progress Notes (Signed)
 Advanced Heart Failure Rounding Note  Cardiologist: None  Chief Complaint:  Subjective:    Feeling fair this morning, upset his grapes were not with breakfast.    Objective:   Weight Range: 108.2 kg Body mass index is 36.26 kg/m.   Vital Signs:   Temp:  [97.6 F (36.4 C)-98.3 F (36.8 C)] 98.2 F (36.8 C) (05/13 1620) Pulse Rate:  [64-69] 64 (05/13 1620) Resp:  [16-20] 16 (05/13 1620) BP: (87-110)/(65-69) 99/65 (05/13 1620) SpO2:  [96 %-100 %] 96 % (05/13 1620) Weight:  [108.2 kg] 108.2 kg (05/13 0248) Last BM Date : 03/25/24  Weight change: Filed Weights   03/25/24 0536 03/25/24 1943 03/26/24 0248  Weight: 107.9 kg 108 kg 108.2 kg    Intake/Output:  No intake or output data in the 24 hours ending 03/26/24 2030    Physical Exam    General: NAD Lungs: Clear to auscultation bilaterally with normal respiratory effort. CV: Heart regular S1/S2, no murmur.  Trace peripheral edema. JVP mildly elevated Abdomen: Soft, no distention.  Neurologic: awake/alert, no gross FND.    Telemetry   Sinus in the 60s  Labs    CBC Recent Labs    03/25/24 0602 03/26/24 0521  WBC 5.2 5.7  NEUTROABS 4.0 4.4  HGB 14.5 14.2  HCT 46.7 46.2  MCV 75.8* 77.3*  PLT 269 259   Basic Metabolic Panel Recent Labs    16/10/96 0602 03/26/24 0521  NA 138 134*  K 2.9* 3.6  CL 96* 95*  CO2 34* 26  GLUCOSE 94 99  BUN 43* 46*  CREATININE 2.32* 2.63*  CALCIUM  9.4 9.1  MG  --  2.3   Liver Function Tests No results for input(s): "AST", "ALT", "ALKPHOS", "BILITOT", "PROT", "ALBUMIN" in the last 72 hours. No results for input(s): "LIPASE", "AMYLASE" in the last 72 hours. Cardiac Enzymes No results for input(s): "CKTOTAL", "CKMB", "CKMBINDEX", "TROPONINI" in the last 72 hours.  BNP: BNP (last 3 results) Recent Labs    12/19/23 1511 01/02/24 1431 02/17/24 1938  BNP 256.2* 294.8* 304.9*    ProBNP (last 3 results) No results for input(s): "PROBNP" in the last 8760  hours.   D-Dimer No results for input(s): "DDIMER" in the last 72 hours. Hemoglobin A1C No results for input(s): "HGBA1C" in the last 72 hours. Fasting Lipid Panel No results for input(s): "CHOL", "HDL", "LDLCALC", "TRIG", "CHOLHDL", "LDLDIRECT" in the last 72 hours. Thyroid  Function Tests No results for input(s): "TSH", "T4TOTAL", "T3FREE", "THYROIDAB" in the last 72 hours.  Invalid input(s): "FREET3"  Other results:   Imaging    No results found.   Medications:     Scheduled Medications:  allopurinol   100 mg Oral Daily   amiodarone   200 mg Oral BID   apixaban   5 mg Oral BID   atorvastatin   40 mg Oral Daily   empagliflozin   10 mg Oral Daily   [START ON 03/27/2024] levothyroxine   50 mcg Oral Q0600   metoprolol  succinate  25 mg Oral Daily   mexiletine  150 mg Oral Q12H   pantoprazole   40 mg Oral Daily   potassium chloride   80 mEq Oral BID   sodium chloride  flush  3 mL Intravenous Q12H   spironolactone   100 mg Oral Daily   [START ON 03/27/2024] torsemide   60 mg Oral Daily    Infusions:   PRN Medications: acetaminophen , ondansetron  **OR** ondansetron  (ZOFRAN ) IV    Patient Profile    Bradley Powell is seen today for evaluation  of VT and chronic HFrEF at the request of Dr. Jerelene Monday with [facility].   Assessment/Plan   Syncope VT  - s/p ICD 4/23. - VT storm 1/25 in setting of hypokalemia - Admitted again 02/17/24 with VT treated with shock in the setting of hypokalemia (he was out of spirolactone) - Admitted 05/01 with VT treated with shock (hypokalemia again noted) - Now admitted with syncope and VT s/p ATP and shock in setting of hypokalemia. EP has seen. Device reprogrammed.  - Will need outpatient cardiac PET given concern for sarcoid on prior MRI - Continue mexiletine 150 mg bid   - Continue amiodarone  200 bid.  - Continue low-dose metoprolol  succinate 25 mg daily   Hypokalemia - K now up to 3.6 - PO K supp - Increased spironolactone  to 100mg   daily -Nephrology consulted, following - Keep K > 4 and Mag > 2  Chronic HFrEF - Has seen geneticist, Dr. Drexel Gentles. Work up w/ likely pathogenic gene variant (unable to see full report) - Given recurrent VT and concern for sarcoidosis on prior cMRI, EP recommending cardiac PET as outpatient. Also consider device upgrade to CRT-D d/t AV dissociation. - NYHA II  - Volume okay today. Decrease toresmide to 60mg  daily today - Increase in spironolactone  as above - Continue Jardiance  10 mg daily.  - Continue low dose metoprolol  - No ARNi and ARB (hypotension and N/V in the past). - No Bidil  (hypotension in the past)   CKD Stage IIIb - Baseline SCr 2.0-2.3 - Mild bump in creatinine today, suspect due to diuresis. Decrease in diuretics as above   AF/AFL - S/p AFL ablation 5/19 with Dr. Carolynne Citron. - Continue Amio per above  - Continue Eliquis  5 mg bid.    Obesity  - BMI 36 - Eventually consider GLP-1 RA   Hypothyroidism - likely amio-induced - continue levothyroxine   Medication concerns reviewed with patient and pharmacy team. Barriers identified: hypokalemia  Length of Stay: 2  [name], MD  03/26/2024, 8:30 PM  Advanced Heart Failure Team Pager [phone] (M-F; 7a - 5p)  Please contact [facility] for night-coverage after hours (5p -7a ) and weekends on [email or web address]

## 2024-03-26 NOTE — Progress Notes (Addendum)
 Progress Note   Patient: Bradley Powell MVH:[id number] DOB: 10/29/1981 DOA: 03/21/2024     2 DOS: the patient was seen and examined on 03/26/2024    Brief hospital course: From HPI "Bradley Powell is a 43 y.o. male with medical history significant of recurrent VT, chronic combined CHF, AFL, CKD, issues with non-compliance and  hypokalemia presenting with syncope and hypokalemia.  Patient reports getting symptoms to drink overnight when patient felt generalized malaise and weakness.  Had a witnessed syncopal episode at home.  Patient denies any chest pain or nausea prior to the event.  Noted baseline history of similar episodes associated with hypokalemia as well as recurrent V. tach.  Patient with ICD in place.  Patient states he did feel like it possibly discharge.  Noted recent admission May 1 to May 3 on the cardiology EP service with issues concerning for ICD shocking as well as V. Tach and hypokalemia.  Symptoms felt to be predominantly associated with hypokalemia.  Had repletion with resolution of symptoms.  Patient states he had a recheck of his potassium yesterday with a potassium of 5.1.  Patient states he only took half his dose later in the day above his regular time.  Subsequently had episode of syncope.  No focal hemiparesis or confusion.  Patient reports compliance with medication regimen at present including apixaban , torsemide  and spironolactone . Presented to the ER afebrile, blood pressures 80s to 100s.  Satting well on room air.  White count 8.2, hemoglobin 13.4, platelets 256.  Troponin 70s.  Potassium 3.1.  Creatinine 1.99.  CT head, CT C-spine within normal limits.  Chest x-ray with mild vascular congestion.  Pacemaker interrogation with noted shock therapy at 325 today.  Also with antitachycardia pacing.  "   Assessment and Plan: Syncope Recurrent syncopal event in setting of cardiac pacemaker and symptomatic hypokalemia Noted admission on the cardiology EP service earlier  this month with similar issues including recurrent V. tach and hypokalemia Potassium level low despite aggressive replacement I have discussed with cardiologist as well as nephrologist and we suspect patient may be having underlying RTA According to cardiologist since patient is high risk of arrhythmia he cannot be discharged until his potassium level stabilizes with initiation of diuretic therapy We will continue aggressive potassium repletion   Persistent hypokalemia despite repletion Patient still having persistent hypokalemia despite aggressive repletion therapy Continue potassium replacement and monitoring   Atrial fibrillation, chronic (HCC) Rate controlled at present Continue home amiodarone  and Eliquis  Monitor   CKD (chronic kidney disease), stage III (HCC) Creatinine slightly worse today Nephrologist on board we appreciate input   Elevated troponin Troponin 70s in the setting of syncopal event No chest pains Monitor on telemetry closely   ICD (implantable cardioverter-defibrillator) in place Status post ICD placement in the setting of AV block Formally interrogated in the ER    NICM (nonischemic cardiomyopathy) (HCC) 2D echo 11/03/2023 with EF 35 to 40% Appears euvolemic at present Continue torsemide  Monitor input and output    Advance Care Planning:   Code Status: Full Code    Consults: Nephrology   Family Communication: No family at the bedside        Subjective:  Patient seen and examined at bedside this morning Patient still losing a lot of potassium despite repletion Patient denies any acute overnight   Physical Exam: Head: Normocephalic and atraumatic.     Nose: Nose normal.     Mouth/Throat:     Mouth: Mucous membranes are moist.  Eyes:  Pupils: Pupils are equal, round, and reactive to light.  Cardiovascular:     Rate and Rhythm: Normal rate and regular rhythm.  Pulmonary:     Effort: Pulmonary effort is normal.  Abdominal:     General:  Bowel sounds are normal.  Musculoskeletal:        General: Normal range of motion.  Skin:    General: Skin is warm.  Neurological:     General: No focal deficit present.  Psychiatric:        Mood and Affect: Mood normal.    Data reviewed      Latest Ref Rng & Units 03/26/2024    5:21 AM 03/25/2024    6:02 AM 03/24/2024    4:34 AM  CBC  WBC 4.0 - 10.5 K/uL 5.7  5.2  5.8   Hemoglobin 13.0 - 17.0 g/dL 16.1  09.6  04.5   Hematocrit 39.0 - 52.0 % 46.2  46.7  47.3   Platelets 150 - 400 K/uL 259  269  275        Latest Ref Rng & Units 03/26/2024    5:21 AM 03/25/2024    6:02 AM 03/24/2024    4:34 AM  BMP  Glucose 70 - 99 mg/dL 99  94  409   BUN 6 - 20 mg/dL 46  43  44   Creatinine 0.61 - 1.24 mg/dL 8.11  9.14  7.82   Sodium 135 - 145 mmol/L 134  138  135   Potassium 3.5 - 5.1 mmol/L 3.6  2.9  3.1   Chloride 98 - 111 mmol/L 95  96  93   CO2 22 - 32 mmol/L 26  34  29   Calcium  8.9 - 10.3 mg/dL 9.1  9.4  9.2     Vitals:   03/26/24 0248 03/26/24 0844 03/26/24 0851 03/26/24 1620  BP: 92/67 (!) 87/65 110/69 99/65  Pulse: 68  69 64  Resp: 20 18  16   Temp: 97.6 F (36.4 C) 98.3 F (36.8 C) 98.3 F (36.8 C) 98.2 F (36.8 C)  TempSrc: Oral  Oral Oral  SpO2: 100% 100% 100% 96%  Weight: 108.2 kg     Height:       Disposition: Hopefully discharge home when potassium level stabilizes   Author: Ezzard Holms, MD 03/26/2024 4:41 PM  For on call review [email or web address].

## 2024-03-26 NOTE — Progress Notes (Signed)
 [facility]  ROUNDING NOTE   Subjective:   Patient seen laying in bed Alert and oriented Room air No lower extremity edema  Potassium 3.6  Objective:  Vital signs in last 24 hours:  Temp:  [97.6 F (36.4 C)-98.3 F (36.8 C)] 98.3 F (36.8 C) (05/13 0851) Pulse Rate:  [68-73] 69 (05/13 0851) Resp:  [18-20] 18 (05/13 0844) BP: (87-110)/(65-73) 110/69 (05/13 0851) SpO2:  [100 %] 100 % (05/13 0851) Weight:  [108 kg-108.2 kg] 108.2 kg (05/13 0248)  Weight change: 0.101 kg Filed Weights   03/25/24 0536 03/25/24 1943 03/26/24 0248  Weight: 107.9 kg 108 kg 108.2 kg    Intake/Output: I/O last 3 completed shifts: In: 1200 [P.O.:1200] Out: -    Intake/Output this shift:  No intake/output data recorded.  Physical Exam: General: NAD,   Head: Normocephalic, atraumatic. Moist oral mucosal membranes  Eyes: Anicteric  Lungs:  Clear to auscultation, normal effort  Heart: Regular rate and rhythm  Abdomen:  Soft, nontender,   Extremities: No peripheral edema.  Neurologic: Alert, moving all four extremities  Skin: No lesions  Access: None    Basic Metabolic Panel: Recent Labs  Lab 03/21/24 0441 03/22/24 0653 03/22/24 1442 03/23/24 0324 03/24/24 0434 03/25/24 0602 03/26/24 0521  NA 134* 133*  --  136 135 138 134*  K 3.1* 3.3* 3.4* 4.1 3.1* 2.9* 3.6  CL 95* 101  --  97* 93* 96* 95*  CO2 26 24  --  28 29 34* 26  GLUCOSE 107* 106*  --  118* 175* 94 99  BUN 39* 36*  --  41* 44* 43* 46*  CREATININE 1.99* 1.84*  --  1.83* 2.37* 2.32* 2.63*  CALCIUM  8.9 8.9  --  8.9 9.2 9.4 9.1  MG 2.0  --   --   --   --   --  2.3    Liver Function Tests: Recent Labs  Lab 03/22/24 0653  AST 24  ALT 31  ALKPHOS 102  BILITOT 1.9*  PROT 6.3*  ALBUMIN 2.9*   No results for input(s): "LIPASE", "AMYLASE" in the last 168 hours. No results for input(s): "AMMONIA" in the last 168 hours.  CBC: Recent Labs  Lab 03/21/24 0441 03/22/24 0653 03/23/24 0324 03/24/24 0434  03/25/24 0602 03/26/24 0521  WBC 8.2 6.0 6.6 5.8 5.2 5.7  NEUTROABS 6.7  --  5.5 4.6 4.0 4.4  HGB 13.4 13.4 14.4 14.6 14.5 14.2  HCT 43.4 44.2 46.5 47.3 46.7 46.2  MCV 77.6* 77.5* 77.1* 77.2* 75.8* 77.3*  PLT 256 240 295 275 269 259    Cardiac Enzymes: No results for input(s): "CKTOTAL", "CKMB", "CKMBINDEX", "TROPONINI" in the last 168 hours.  BNP: Invalid input(s): "POCBNP"  CBG: Recent Labs  Lab 03/25/24 0511 03/26/24 0315  GLUCAP 120* 102*    Microbiology: Results for orders placed or performed during the hospital encounter of 09/29/23  Culture, blood (Routine X 2) w Reflex to ID Panel     Status: None   Collection Time: 10/05/23 12:02 AM   Specimen: BLOOD RIGHT ARM  Result Value Ref Range Status   Specimen Description BLOOD RIGHT ARM  Final   Special Requests   Final    BOTTLES DRAWN AEROBIC AND ANAEROBIC Blood Culture results may not be optimal due to an excessive volume of blood received in culture bottles   Culture   Final    NO GROWTH 5 DAYS Performed at [facility], [address] [place], [place] [address]  Report Status 10/10/2023 FINAL  Final  Culture, blood (Routine X 2) w Reflex to ID Panel     Status: None   Collection Time: 10/05/23 12:05 AM   Specimen: BLOOD LEFT ARM  Result Value Ref Range Status   Specimen Description BLOOD LEFT ARM  Final   Special Requests   Final    BOTTLES DRAWN AEROBIC AND ANAEROBIC Blood Culture results may not be optimal due to an excessive volume of blood received in culture bottles   Culture   Final    NO GROWTH 5 DAYS Performed at [facility], [address], [place], [place] [address]    Report Status 10/10/2023 FINAL  Final  Urine Culture (for pregnant, neutropenic or urologic patients or patients with an indwelling urinary catheter)     Status: Abnormal   Collection Time: 10/05/23  5:42 AM   Specimen: Urine, Clean Catch  Result Value Ref Range Status   Specimen Description URINE, CLEAN CATCH   Final   Special Requests NONE  Final   Culture (A)  Final    <10,000 COLONIES/mL INSIGNIFICANT GROWTH Performed at [facility], [address] [place], [place] [address]    Report Status 10/06/2023 FINAL  Final  Respiratory (~20 pathogens) panel by PCR     Status: None   Collection Time: 10/05/23 12:02 PM   Specimen: Nasopharyngeal Swab; Respiratory  Result Value Ref Range Status   Adenovirus NOT DETECTED NOT DETECTED Final   Coronavirus 229E NOT DETECTED NOT DETECTED Final    Comment: (NOTE) The Coronavirus on the Respiratory Panel, DOES NOT test for the novel  Coronavirus (2019 nCoV)    Coronavirus HKU1 NOT DETECTED NOT DETECTED Final   Coronavirus NL63 NOT DETECTED NOT DETECTED Final   Coronavirus OC43 NOT DETECTED NOT DETECTED Final   Metapneumovirus NOT DETECTED NOT DETECTED Final   Rhinovirus / Enterovirus NOT DETECTED NOT DETECTED Final   Influenza A NOT DETECTED NOT DETECTED Final   Influenza B NOT DETECTED NOT DETECTED Final   Parainfluenza Virus 1 NOT DETECTED NOT DETECTED Final   Parainfluenza Virus 2 NOT DETECTED NOT DETECTED Final   Parainfluenza Virus 3 NOT DETECTED NOT DETECTED Final   Parainfluenza Virus 4 NOT DETECTED NOT DETECTED Final   Respiratory Syncytial Virus NOT DETECTED NOT DETECTED Final   Bordetella pertussis NOT DETECTED NOT DETECTED Final   Bordetella Parapertussis NOT DETECTED NOT DETECTED Final   Chlamydophila pneumoniae NOT DETECTED NOT DETECTED Final   Mycoplasma pneumoniae NOT DETECTED NOT DETECTED Final    Comment: Performed at [facility], [address], [place], [place] [address]    Coagulation Studies: No results for input(s): "LABPROT", "INR" in the last 72 hours.  Urinalysis: No results for input(s): "COLORURINE", "LABSPEC", "PHURINE", "GLUCOSEU", "HGBUR", "BILIRUBINUR", "KETONESUR", "PROTEINUR", "UROBILINOGEN", "NITRITE", "LEUKOCYTESUR" in the last 72 hours.  Invalid input(s): "APPERANCEUR"    Imaging: No results  found.   Medications:     allopurinol   100 mg Oral Daily   amiodarone   200 mg Oral BID   apixaban   5 mg Oral BID   atorvastatin   40 mg Oral Daily   empagliflozin   10 mg Oral Daily   [START ON 03/27/2024] levothyroxine   50 mcg Oral Q0600   metoprolol  succinate  25 mg Oral Daily   mexiletine  150 mg Oral Q12H   pantoprazole   40 mg Oral Daily   potassium chloride   60 mEq Oral BID   sodium chloride  flush  3 mL Intravenous Q12H  spironolactone   100 mg Oral Daily   [START ON 03/27/2024] torsemide   60 mg Oral Daily   acetaminophen , ondansetron  **OR** ondansetron  (ZOFRAN ) IV  Assessment/ Plan:  [name] is a 43 y.o.  male with past medical conditions including chronic combined CHF, CKD, AFL, defibrillator, who was admitted to [facility] on 03/21/2024 for Hypokalemia [E87.6] AICD discharge [Z45.02] Syncope, unspecified syncope type [R55]    Chronic kidney disease stage IIIb, baseline GFR appears to be 37-38.  GFR currently 47.  Creatinine slightly elevated today, not unexpected with increase in diuretic.  Will continue to monitor for now.  No acute indication for dialysis.    2. Hypokalemia due to insufficient supplementation.  Home regimen includes potassium chloride  80 mill equivalents twice a day.  Patient currently prescribed potassium chloride  60mEq twice daily, Appreciate consulting cardiology to discuss home regimen.  Patient currently prescribed torsemide  60 mg twice daily.  Primary team has ordered 40 mill equivalents of IV supplementation.  Spironolactone  now 100 mg daily.  Potassium 3.6 today.  Due to high dose of diuretics, will be difficult to maintain a stable potassium level.    LOS: 2 Bradley Powell 5/13/20251:49 PM

## 2024-03-27 DIAGNOSIS — Z4502 Encounter for adjustment and management of automatic implantable cardiac defibrillator: Secondary | ICD-10-CM | POA: Diagnosis not present

## 2024-03-27 LAB — CBC WITH DIFFERENTIAL/PLATELET
Abs Immature Granulocytes: 0.03 10*3/uL (ref 0.00–0.07)
Basophils Absolute: 0 10*3/uL (ref 0.0–0.1)
Basophils Relative: 1 %
Eosinophils Absolute: 0 10*3/uL (ref 0.0–0.5)
Eosinophils Relative: 1 %
HCT: 43.8 % (ref 39.0–52.0)
Hemoglobin: 13.6 g/dL (ref 13.0–17.0)
Immature Granulocytes: 1 %
Lymphocytes Relative: 7 %
Lymphs Abs: 0.5 10*3/uL — ABNORMAL LOW (ref 0.7–4.0)
MCH: 24.1 pg — ABNORMAL LOW (ref 26.0–34.0)
MCHC: 31.1 g/dL (ref 30.0–36.0)
MCV: 77.7 fL — ABNORMAL LOW (ref 80.0–100.0)
Monocytes Absolute: 0.8 10*3/uL (ref 0.1–1.0)
Monocytes Relative: 11 %
Neutro Abs: 5.3 10*3/uL (ref 1.7–7.7)
Neutrophils Relative %: 79 %
Platelets: 251 10*3/uL (ref 150–400)
RBC: 5.64 MIL/uL (ref 4.22–5.81)
RDW: 17.9 % — ABNORMAL HIGH (ref 11.5–15.5)
WBC: 6.7 10*3/uL (ref 4.0–10.5)
nRBC: 0 % (ref 0.0–0.2)

## 2024-03-27 LAB — BASIC METABOLIC PANEL WITH GFR
Anion gap: 6 (ref 5–15)
BUN: 50 mg/dL — ABNORMAL HIGH (ref 6–20)
CO2: 23 mmol/L (ref 22–32)
Calcium: 8.8 mg/dL — ABNORMAL LOW (ref 8.9–10.3)
Chloride: 103 mmol/L (ref 98–111)
Creatinine, Ser: 2.32 mg/dL — ABNORMAL HIGH (ref 0.61–1.24)
GFR, Estimated: 36 mL/min — ABNORMAL LOW (ref >60)
Glucose, Bld: 116 mg/dL — ABNORMAL HIGH (ref 70–99)
Potassium: 4.2 mmol/L (ref 3.5–5.1)
Sodium: 132 mmol/L — ABNORMAL LOW (ref 135–145)

## 2024-03-27 LAB — GLUCOSE, CAPILLARY: Glucose-Capillary: 107 mg/dL — ABNORMAL HIGH (ref 70–99)

## 2024-03-27 MED ORDER — POTASSIUM CHLORIDE CRYS ER 20 MEQ PO TBCR
60.0000 meq | EXTENDED_RELEASE_TABLET | Freq: Three times a day (TID) | ORAL | Status: DC
  Administered 2024-03-27 – 2024-03-28 (×4): 60 meq via ORAL
  Filled 2024-03-27 (×4): qty 3

## 2024-03-27 MED ORDER — METOLAZONE 5 MG PO TABS
5.0000 mg | ORAL_TABLET | Freq: Once | ORAL | Status: AC
  Administered 2024-03-27: 5 mg via ORAL
  Filled 2024-03-27: qty 1

## 2024-03-27 MED ORDER — TORSEMIDE 20 MG PO TABS
60.0000 mg | ORAL_TABLET | Freq: Two times a day (BID) | ORAL | Status: DC
  Administered 2024-03-27 – 2024-03-30 (×6): 60 mg via ORAL
  Filled 2024-03-27 (×6): qty 3

## 2024-03-27 MED ORDER — POTASSIUM CHLORIDE CRYS ER 20 MEQ PO TBCR: 60.0000 meq | EXTENDED_RELEASE_TABLET | Freq: Two times a day (BID) | ORAL | Status: DC

## 2024-03-27 NOTE — Hospital Course (Addendum)
 Hospital course / significant events:   Bradley Powell is a 43 y.o. male with medical history significant of recurrent VT, chronic combined CHF, AFL, CKD, issues with non-compliance and  hypokalemia presenting with syncope and hypokalemia. Noted history of similar episodes associated with hypokalemia as well as recurrent V. tach. Patient with ICD in place. Patient states he did feel like it possibly discharge. Noted recent admission May 1 to May 3 on the cardiology EP service with issues concerning for ICD shocking as well as V. Tach and hypokalemia. Symptoms felt to be predominantly associated with hypokalemia. Had repletion with resolution of symptoms.      Consultants:  Cardiology / Heart Failure team  Nephrology   Procedures/Surgeries: none      ASSESSMENT & PLAN:   Syncope VT  s/p ICD 4/23. VT storm 1/25 in setting of hypokalemia. Admitted again 02/17/24 with VT treated with shock in the setting of hypokalemia (he was out of spirolactone). Admitted 05/01 with VT treated with shock (hypokalemia again noted). Now admitted with syncope and VT s/p ATP and shock in setting of hypokalemia.  EP has seen. Device reprogrammed.  outpatient cardiac PET given concern for sarcoid on prior MRI Continue mexiletine 150 mg bid   Continue amiodarone  200 bid.  Continue low-dose metoprolol  succinate 25 mg daily Potassium management as below   Persistent hypokalemia despite repletion suspect patient may be having underlying RTA patient is high risk of arrhythmia - he cannot be discharged until his potassium level stabilizes with initiation of diuretic therapy trial KCl 60 mill equivalents 3 times daily and assess for response. If stable can hopefully discharge following this.  spironolactone  to 100mg  daily Nephrology consulted, following Keep K > 4 and Mag > 2 Monitor BMP   Atrial fibrillation, chronic  AF/AFL S/p AFL ablation 5/19 with Dr. Carolynne Citron. Rate controlled at present  Continue  Amio per above  Continue Eliquis  5 mg bid.    Chronic HFrEF, NYHA II NICM (nonischemic cardiomyopathy)  2D echo 11/03/2023 with EF 35 to 40% Has seen geneticist, Dr. Drexel Gentles. Work up w/ likely pathogenic gene variant (unable to see full report).  Given recurrent VT and concern for sarcoidosis on prior cMRI, EP recommending cardiac PET as outpatient.  consider device upgrade to CRT-D d/t AV dissociation. toresmide to 60mg  daily today spironolactone  as above Continue Jardiance  10 mg daily.  Continue low dose metoprolol  No ARNi and ARB (hypotension and N/V in the past). No Bidil  (hypotension in the past) Follow w/ cardiology   CKD (chronic kidney disease), stage IIIa Baseline SCr 2.0-2.3. Creatinine slightly worse yesterday, better again today  Nephrologist on board we appreciate input Monitor BMP  Elevated troponin Troponin 70s in the setting of syncopal event No chest pains Monitor on telemetry closely   ICD (implantable cardioverter-defibrillator) in place Status post ICD placement in the setting of AV block Formally interrogated in the ER  Follow w/ cardiology   Hypothyroidism likely amio-induced continue levothyroxine    Class 2 obesity based on BMI: Body mass index is 37.04 kg/m.[name] Significantly low or high BMI is associated with higher medical risk.  Underweight - under 18  overweight - 25 to 29 obese - 30 or more Class 1 obesity: BMI of 30.0 to 34 Class 2 obesity: BMI of 35.0 to 39 Class 3 obesity: BMI of 40.0 to 49 Super Morbid Obesity: BMI 50-59 Super-super Morbid Obesity: BMI 60+ Healthy nutrition and physical activity advised as adjunct to other disease management and risk reduction treatments  DVT prophylaxis: eliquis  IV fluids: no continuous IV fluids  Nutrition: cardiac Central lines / other devices: none  Code Status: FULL CODE ACP documentation reviewed:  none on file in [facility]  TOC needs: none at this time Medical barriers to dispo: potassium  levels. Expected medical readiness for discharge once K stable / per cardiology clearance.

## 2024-03-27 NOTE — Progress Notes (Signed)
 [facility]  ROUNDING NOTE   Subjective:   Patient seen sitting at bedside Completed breakfast tray at bedside Remains on room air, denies shortness of breath Feels as if he is retaining fluid  Potassium 4.2 Creatinine 2.32  Objective:  Vital signs in last 24 hours:  Temp:  [98.1 F (36.7 C)-98.5 F (36.9 C)] 98.1 F (36.7 C) (05/14 0846) Pulse Rate:  [64-70] 69 (05/14 0928) Resp:  [16-22] 22 (05/14 0846) BP: (93-117)/(60-78) 117/78 (05/14 0928) SpO2:  [96 %-100 %] 100 % (05/14 0846) Weight:  [110.5 kg] 110.5 kg (05/14 0215)  Weight change: 2.495 kg Filed Weights   03/25/24 1943 03/26/24 0248 03/27/24 0215  Weight: 108 kg 108.2 kg 110.5 kg    Intake/Output: I/O last 3 completed shifts: In: 240 [P.O.:240] Out: -    Intake/Output this shift:  Total I/O In: 480 [P.O.:480] Out: -   Physical Exam: General: NAD  Head: Normocephalic, atraumatic. Moist oral mucosal membranes  Eyes: Anicteric  Lungs:  Clear to auscultation, normal effort  Heart: Regular rate and rhythm  Abdomen:  Soft, nontender  Extremities: Trace peripheral edema.  Neurologic: Alert, moving all four extremities  Skin: No lesions  Access: None    Basic Metabolic Panel: Recent Labs  Lab 03/21/24 0441 03/22/24 0653 03/23/24 0324 03/24/24 0434 03/25/24 0602 03/26/24 0521 03/27/24 0610  NA 134*   < > 136 135 138 134* 132*  K 3.1*   < > 4.1 3.1* 2.9* 3.6 4.2  CL 95*   < > 97* 93* 96* 95* 103  CO2 26   < > 28 29 34* 26 23  GLUCOSE 107*   < > 118* 175* 94 99 116*  BUN 39*   < > 41* 44* 43* 46* 50*  CREATININE 1.99*   < > 1.83* 2.37* 2.32* 2.63* 2.32*  CALCIUM  8.9   < > 8.9 9.2 9.4 9.1 8.8*  MG 2.0  --   --   --   --  2.3  --    < > = values in this interval not displayed.    Liver Function Tests: Recent Labs  Lab 03/22/24 0653  AST 24  ALT 31  ALKPHOS 102  BILITOT 1.9*  PROT 6.3*  ALBUMIN 2.9*   No results for input(s): "LIPASE", "AMYLASE" in the last 168  hours. No results for input(s): "AMMONIA" in the last 168 hours.  CBC: Recent Labs  Lab 03/23/24 0324 03/24/24 0434 03/25/24 0602 03/26/24 0521 03/27/24 0610  WBC 6.6 5.8 5.2 5.7 6.7  NEUTROABS 5.5 4.6 4.0 4.4 5.3  HGB 14.4 14.6 14.5 14.2 13.6  HCT 46.5 47.3 46.7 46.2 43.8  MCV 77.1* 77.2* 75.8* 77.3* 77.7*  PLT 295 275 269 259 251    Cardiac Enzymes: No results for input(s): "CKTOTAL", "CKMB", "CKMBINDEX", "TROPONINI" in the last 168 hours.  BNP: Invalid input(s): "POCBNP"  CBG: Recent Labs  Lab 03/25/24 0511 03/26/24 0315 03/27/24 0812  GLUCAP 120* 102* 107*    Microbiology: Results for orders placed or performed during the hospital encounter of 09/29/23  Culture, blood (Routine X 2) w Reflex to ID Panel     Status: None   Collection Time: 10/05/23 12:02 AM   Specimen: BLOOD RIGHT ARM  Result Value Ref Range Status   Specimen Description BLOOD RIGHT ARM  Final   Special Requests   Final    BOTTLES DRAWN AEROBIC AND ANAEROBIC Blood Culture results may not be optimal due to an excessive volume of blood received  in culture bottles   Culture   Final    NO GROWTH 5 DAYS Performed at [facility], [address], [place], [place] [address]    Report Status 10/10/2023 FINAL  Final  Culture, blood (Routine X 2) w Reflex to ID Panel     Status: None   Collection Time: 10/05/23 12:05 AM   Specimen: BLOOD LEFT ARM  Result Value Ref Range Status   Specimen Description BLOOD LEFT ARM  Final   Special Requests   Final    BOTTLES DRAWN AEROBIC AND ANAEROBIC Blood Culture results may not be optimal due to an excessive volume of blood received in culture bottles   Culture   Final    NO GROWTH 5 DAYS Performed at [facility], [address] [place], [place] [address]    Report Status 10/10/2023 FINAL  Final  Urine Culture (for pregnant, neutropenic or urologic patients or patients with an indwelling urinary catheter)     Status: Abnormal   Collection Time:  10/05/23  5:42 AM   Specimen: Urine, Clean Catch  Result Value Ref Range Status   Specimen Description URINE, CLEAN CATCH  Final   Special Requests NONE  Final   Culture (A)  Final    <10,000 COLONIES/mL INSIGNIFICANT GROWTH Performed at [facility], [address] [place], [place] [address]    Report Status 10/06/2023 FINAL  Final  Respiratory (~20 pathogens) panel by PCR     Status: None   Collection Time: 10/05/23 12:02 PM   Specimen: Nasopharyngeal Swab; Respiratory  Result Value Ref Range Status   Adenovirus NOT DETECTED NOT DETECTED Final   Coronavirus 229E NOT DETECTED NOT DETECTED Final    Comment: (NOTE) The Coronavirus on the Respiratory Panel, DOES NOT test for the novel  Coronavirus (2019 nCoV)    Coronavirus HKU1 NOT DETECTED NOT DETECTED Final   Coronavirus NL63 NOT DETECTED NOT DETECTED Final   Coronavirus OC43 NOT DETECTED NOT DETECTED Final   Metapneumovirus NOT DETECTED NOT DETECTED Final   Rhinovirus / Enterovirus NOT DETECTED NOT DETECTED Final   Influenza A NOT DETECTED NOT DETECTED Final   Influenza B NOT DETECTED NOT DETECTED Final   Parainfluenza Virus 1 NOT DETECTED NOT DETECTED Final   Parainfluenza Virus 2 NOT DETECTED NOT DETECTED Final   Parainfluenza Virus 3 NOT DETECTED NOT DETECTED Final   Parainfluenza Virus 4 NOT DETECTED NOT DETECTED Final   Respiratory Syncytial Virus NOT DETECTED NOT DETECTED Final   Bordetella pertussis NOT DETECTED NOT DETECTED Final   Bordetella Parapertussis NOT DETECTED NOT DETECTED Final   Chlamydophila pneumoniae NOT DETECTED NOT DETECTED Final   Mycoplasma pneumoniae NOT DETECTED NOT DETECTED Final    Comment: Performed at [facility], [address] [place], [place] [address]    Coagulation Studies: No results for input(s): "LABPROT", "INR" in the last 72 hours.  Urinalysis: No results for input(s): "COLORURINE", "LABSPEC", "PHURINE", "GLUCOSEU", "HGBUR", "BILIRUBINUR", "KETONESUR", "PROTEINUR",  "UROBILINOGEN", "NITRITE", "LEUKOCYTESUR" in the last 72 hours.  Invalid input(s): "APPERANCEUR"    Imaging: No results found.   Medications:     allopurinol   100 mg Oral Daily   amiodarone   200 mg Oral BID   apixaban   5 mg Oral BID   atorvastatin   40 mg Oral Daily   empagliflozin   10 mg Oral Daily   levothyroxine   50 mcg Oral Q0600   metoprolol  succinate  25 mg Oral Daily   mexiletine  150 mg  Oral Q12H   pantoprazole   40 mg Oral Daily   potassium chloride   60 mEq Oral TID   sodium chloride  flush  3 mL Intravenous Q12H   spironolactone   100 mg Oral Daily   torsemide   60 mg Oral BID   acetaminophen , ondansetron  **OR** ondansetron  (ZOFRAN ) IV  Assessment/ Plan:  [name] is a 43 y.o.  male with past medical conditions including chronic combined CHF, CKD, AFL, defibrillator, who was admitted to [facility] on 03/21/2024 for Hypokalemia [E87.6] AICD discharge [Z45.02] Syncope, unspecified syncope type [R55]    Chronic kidney disease stage IIIb, baseline GFR appears to be 37-38.  GFR currently 47.  Creatinine has improved today.  Patient reports adequate urine output but feels he has begun to retain fluid.  Cardiology has ordered metolazone .  Will continue to monitor for now.  Patient will need follow-up with [facility] in [place] at discharge.   2. Hypokalemia due to insufficient supplementation.  Home regimen includes potassium chloride  80 mill equivalents twice a day.  Patient currently prescribed potassium chloride  60mEq twice daily, Appreciate consulting cardiology to discuss home regimen.  Patient currently prescribed torsemide  60 mg twice daily.  Primary team has ordered 40 mill equivalents of IV supplementation.  Spironolactone  now 100 mg daily.   Potassium acceptable at 4.2.  Continue current regimen.    LOS: 3 Berry Gallacher 5/14/20252:52 PM

## 2024-03-27 NOTE — Progress Notes (Signed)
 Advanced Heart Failure Rounding Note  Cardiologist: None  Chief Complaint:  Subjective:    Mild increase in weight this morning, creatinine downtrending after decrease in torsemide  dosing.  Potassium has stabilized.  Given that he takes metolazone  twice weekly on Wednesday and Sunday, will increase back to torsemide  60 mg twice daily and given dose of home metolazone  today.  Will trial KCl 60 mill equivalents 3 times daily and assess for response. If stable can hopefully discharge following this.    Objective:   Weight Range: 110.5 kg Body mass index is 37.04 kg/m.   Vital Signs:   Temp:  [98.1 F (36.7 C)-98.5 F (36.9 C)] 98.1 F (36.7 C) (05/14 0846) Pulse Rate:  [64-70] 69 (05/14 0928) Resp:  [16-22] 22 (05/14 0846) BP: (93-117)/(60-78) 117/78 (05/14 0928) SpO2:  [96 %-100 %] 100 % (05/14 0846) Weight:  [110.5 kg] 110.5 kg (05/14 0215) Last BM Date : 03/26/24  Weight change: Filed Weights   03/25/24 1943 03/26/24 0248 03/27/24 0215  Weight: 108 kg 108.2 kg 110.5 kg    Intake/Output:   Intake/Output Summary (Last 24 hours) at 03/27/2024 1118 Last data filed at 03/27/2024 1059 Gross per 24 hour  Intake 720 ml  Output --  Net 720 ml      Physical Exam    General: NAD Lungs: Clear to auscultation bilaterally with normal respiratory effort. CV: Heart regular S1/S2, no murmur.  1+ peripheral edema. JVP mildly elevated Abdomen: Soft, no distention.  Neurologic: awake/alert, no gross FND.    Telemetry   Sinus in the 60s  Labs    CBC Recent Labs    03/26/24 0521 03/27/24 0610  WBC 5.7 6.7  NEUTROABS 4.4 5.3  HGB 14.2 13.6  HCT 46.2 43.8  MCV 77.3* 77.7*  PLT 259 251   Basic Metabolic Panel Recent Labs    96/04/54 0521 03/27/24 0610  NA 134* 132*  K 3.6 4.2  CL 95* 103  CO2 26 23  GLUCOSE 99 116*  BUN 46* 50*  CREATININE 2.63* 2.32*  CALCIUM  9.1 8.8*  MG 2.3  --    Liver Function Tests No results for input(s): "AST", "ALT",  "ALKPHOS", "BILITOT", "PROT", "ALBUMIN" in the last 72 hours. No results for input(s): "LIPASE", "AMYLASE" in the last 72 hours. Cardiac Enzymes No results for input(s): "CKTOTAL", "CKMB", "CKMBINDEX", "TROPONINI" in the last 72 hours.  BNP: BNP (last 3 results) Recent Labs    12/19/23 1511 01/02/24 1431 02/17/24 1938  BNP 256.2* 294.8* 304.9*    ProBNP (last 3 results) No results for input(s): "PROBNP" in the last 8760 hours.   D-Dimer No results for input(s): "DDIMER" in the last 72 hours. Hemoglobin A1C No results for input(s): "HGBA1C" in the last 72 hours. Fasting Lipid Panel No results for input(s): "CHOL", "HDL", "LDLCALC", "TRIG", "CHOLHDL", "LDLDIRECT" in the last 72 hours. Thyroid  Function Tests No results for input(s): "TSH", "T4TOTAL", "T3FREE", "THYROIDAB" in the last 72 hours.  Invalid input(s): "FREET3"  Other results:   Imaging    No results found.   Medications:     Scheduled Medications:  allopurinol   100 mg Oral Daily   amiodarone   200 mg Oral BID   apixaban   5 mg Oral BID   atorvastatin   40 mg Oral Daily   empagliflozin   10 mg Oral Daily   levothyroxine   50 mcg Oral Q0600   metoprolol  succinate  25 mg Oral Daily   mexiletine  150 mg Oral Q12H   pantoprazole   40 mg Oral Daily   potassium chloride   60 mEq Oral TID   sodium chloride  flush  3 mL Intravenous Q12H   spironolactone   100 mg Oral Daily   torsemide   60 mg Oral BID    Infusions:   PRN Medications: acetaminophen , ondansetron  **OR** ondansetron  (ZOFRAN ) IV    Patient Profile    Bradley Powell is seen today for evaluation of VT and chronic HFrEF at the request of Dr. Jerelene Monday with [facility].   Assessment/Plan   Syncope VT  - s/p ICD 4/23. - VT storm 1/25 in setting of hypokalemia - Admitted again 02/17/24 with VT treated with shock in the setting of hypokalemia (he was out of spirolactone) - Admitted 05/01 with VT treated with shock (hypokalemia again noted) -  Now admitted with syncope and VT s/p ATP and shock in setting of hypokalemia. EP has seen. Device reprogrammed.  - Will need outpatient cardiac PET given concern for sarcoid on prior MRI, to be arranged - Continue mexiletine 150 mg bid   - Continue amiodarone  200 bid.  - Continue low-dose metoprolol  succinate 25 mg daily   Hypokalemia - K now up to 4.2 - PO K supp. Continue with 60mEq TID - Increased spironolactone  to 100mg  daily - Torsemide  60mg  BID, metolazone  5mg  once today -Nephrology consulted, following - Keep K > 4 and Mag > 2  Chronic HFrEF - Has seen geneticist, Dr. Drexel Gentles. Work up w/ likely pathogenic gene variant (unable to see full report) - Given recurrent VT and concern for sarcoidosis on prior cMRI, EP recommending cardiac PET as outpatient. Also consider device upgrade to CRT-D d/t AV dissociation. - NYHA II  -Return to home dosing, torsemide  60mg  BID with metolazone  today - Cotinue spironolactone  100mg  daily - Continue Jardiance  10 mg daily.  - Continue low dose metoprolol  - No ARNi and ARB (hypotension and N/V in the past). - No Bidil  (hypotension in the past)   CKD Stage IIIb - Baseline SCr 2.0-2.3 - Creatinine at baseline today   AF/AFL - S/p AFL ablation 5/19 with Dr. Carolynne Citron. - Continue Amio per above  - Continue Eliquis  5 mg bid.    Obesity  - BMI 37 - Eventually consider GLP-1 RA   Hypothyroidism - likely amio-induced - continue levothyroxine   Medication concerns reviewed with patient and pharmacy team. Barriers identified: hypokalemia.  If stable tomorrow could potentially discharge  Length of Stay: 3  [name], MD  03/27/2024, 11:18 AM  Advanced Heart Failure Team Pager [phone] (M-F; 7a - 5p)  Please contact [facility] for night-coverage after hours (5p -7a ) and weekends on [email or web address]

## 2024-03-27 NOTE — Progress Notes (Signed)
 PROGRESS NOTE    Bradley Powell   GEX:[id number] DOB: 1981/06/01  DOA: 03/21/2024 Date of Service: 03/27/24 which is hospital day 3  PCP: Merl Star, MD    Hospital course / significant events:   Bradley Powell is a 43 y.o. male with medical history significant of recurrent VT, chronic combined CHF, AFL, CKD, issues with non-compliance and  hypokalemia presenting with syncope and hypokalemia. Noted history of similar episodes associated with hypokalemia as well as recurrent V. tach. Patient with ICD in place. Patient states he did feel like it possibly discharge. Noted recent admission May 1 to May 3 on the cardiology EP service with issues concerning for ICD shocking as well as V. Tach and hypokalemia. Symptoms felt to be predominantly associated with hypokalemia. Had repletion with resolution of symptoms.      Consultants:  Cardiology / Heart Failure team  Nephrology   Procedures/Surgeries: none      ASSESSMENT & PLAN:   Syncope VT  s/p ICD 4/23. VT storm 1/25 in setting of hypokalemia. Admitted again 02/17/24 with VT treated with shock in the setting of hypokalemia (he was out of spirolactone). Admitted 05/01 with VT treated with shock (hypokalemia again noted). Now admitted with syncope and VT s/p ATP and shock in setting of hypokalemia.  EP has seen. Device reprogrammed.  outpatient cardiac PET given concern for sarcoid on prior MRI Continue mexiletine 150 mg bid   Continue amiodarone  200 bid.  Continue low-dose metoprolol  succinate 25 mg daily Potassium management as below   Persistent hypokalemia despite repletion suspect patient may be having underlying RTA patient is high risk of arrhythmia - he cannot be discharged until his potassium level stabilizes with initiation of diuretic therapy trial KCl 60 mill equivalents 3 times daily and assess for response. If stable can hopefully discharge following this.  spironolactone  to 100mg  daily Nephrology  consulted, following Keep K > 4 and Mag > 2 Monitor BMP   Atrial fibrillation, chronic  AF/AFL S/p AFL ablation 5/19 with Dr. Carolynne Citron. Rate controlled at present  Continue Amio per above  Continue Eliquis  5 mg bid.    Chronic HFrEF, NYHA II NICM (nonischemic cardiomyopathy)  2D echo 11/03/2023 with EF 35 to 40% Has seen geneticist, Dr. Drexel Gentles. Work up w/ likely pathogenic gene variant (unable to see full report).  Given recurrent VT and concern for sarcoidosis on prior cMRI, EP recommending cardiac PET as outpatient.  consider device upgrade to CRT-D d/t AV dissociation. toresmide to 60mg  daily today spironolactone  as above Continue Jardiance  10 mg daily.  Continue low dose metoprolol  No ARNi and ARB (hypotension and N/V in the past). No Bidil  (hypotension in the past) Follow w/ cardiology   CKD (chronic kidney disease), stage IIIa Baseline SCr 2.0-2.3. Creatinine slightly worse yesterday, better again today  Nephrologist on board we appreciate input Monitor BMP  Elevated troponin Troponin 70s in the setting of syncopal event No chest pains Monitor on telemetry closely   ICD (implantable cardioverter-defibrillator) in place Status post ICD placement in the setting of AV block Formally interrogated in the ER  Follow w/ cardiology   Hypothyroidism likely amio-induced continue levothyroxine    Class 2 obesity based on BMI: Body mass index is 37.04 kg/m.Bradley Powell Significantly low or high BMI is associated with higher medical risk.  Underweight - under 18  overweight - 25 to 29 obese - 30 or more Class 1 obesity: BMI of 30.0 to 34 Class 2 obesity: BMI of 35.0 to 39 Class 3 obesity:  BMI of 40.0 to 49 Super Morbid Obesity: BMI 50-59 Super-super Morbid Obesity: BMI 60+ Healthy nutrition and physical activity advised as adjunct to other disease management and risk reduction treatments    DVT prophylaxis: eliquis  IV fluids: no continuous IV fluids  Nutrition: cardiac Central  lines / other devices: none  Code Status: FULL CODE ACP documentation reviewed:  none on file in [facility]  TOC needs: none at this time Medical barriers to dispo: potassium levels. Expected medical readiness for discharge once K stable / per cardiology clearance.              Subjective / Brief ROS:  Patient reports no concerns Denies CP/SOB.  Pain controlled.  Denies new weakness.  Tolerating diet.  Reports no concerns w/ urination/defecation.   Family Communication: none at this itme     Objective Findings:  Vitals:   03/27/24 0846 03/27/24 0928 03/27/24 1525 03/27/24 1608  BP: 93/60 117/78 (!) 88/59 98/69  Pulse: 68 69 65 66  Resp: (!) 22  18   Temp: 98.1 F (36.7 C)  97.8 F (36.6 C)   TempSrc: Oral  Oral   SpO2: 100%  99%   Weight:      Height:        Intake/Output Summary (Last 24 hours) at 03/27/2024 1636 Last data filed at 03/27/2024 1059 Gross per 24 hour  Intake 720 ml  Output --  Net 720 ml   Filed Weights   03/25/24 1943 03/26/24 0248 03/27/24 0215  Weight: 108 kg 108.2 kg 110.5 kg    Examination:  Physical Exam Constitutional:      General: He is not in acute distress.    Appearance: He is not ill-appearing.  Cardiovascular:     Rate and Rhythm: Normal rate and regular rhythm.  Pulmonary:     Effort: Pulmonary effort is normal.     Breath sounds: Normal breath sounds.  Musculoskeletal:     Right lower leg: No edema.     Left lower leg: No edema.  Neurological:     General: No focal deficit present.     Mental Status: He is alert and oriented to person, place, and time.  Psychiatric:        Mood and Affect: Mood normal.        Behavior: Behavior normal.          Scheduled Medications:   allopurinol   100 mg Oral Daily   amiodarone   200 mg Oral BID   apixaban   5 mg Oral BID   atorvastatin   40 mg Oral Daily   empagliflozin   10 mg Oral Daily   levothyroxine   50 mcg Oral Q0600   metoprolol  succinate  25 mg Oral Daily    mexiletine  150 mg Oral Q12H   pantoprazole   40 mg Oral Daily   potassium chloride   60 mEq Oral TID   sodium chloride  flush  3 mL Intravenous Q12H   spironolactone   100 mg Oral Daily   torsemide   60 mg Oral BID    Continuous Infusions:   PRN Medications:  acetaminophen , ondansetron  **OR** ondansetron  (ZOFRAN ) IV  Antimicrobials from admission:  Anti-infectives (From admission, onward)    None           Data Reviewed:  I have personally reviewed the following...  CBC: Recent Labs  Lab 03/23/24 0324 03/24/24 0434 03/25/24 0602 03/26/24 0521 03/27/24 0610  WBC 6.6 5.8 5.2 5.7 6.7  NEUTROABS 5.5 4.6 4.0 4.4 5.3  HGB 14.4  14.6 14.5 14.2 13.6  HCT 46.5 47.3 46.7 46.2 43.8  MCV 77.1* 77.2* 75.8* 77.3* 77.7*  PLT 295 275 269 259 251   Basic Metabolic Panel: Recent Labs  Lab 03/21/24 0441 03/22/24 0653 03/23/24 0324 03/24/24 0434 03/25/24 0602 03/26/24 0521 03/27/24 0610  NA 134*   < > 136 135 138 134* 132*  K 3.1*   < > 4.1 3.1* 2.9* 3.6 4.2  CL 95*   < > 97* 93* 96* 95* 103  CO2 26   < > 28 29 34* 26 23  GLUCOSE 107*   < > 118* 175* 94 99 116*  BUN 39*   < > 41* 44* 43* 46* 50*  CREATININE 1.99*   < > 1.83* 2.37* 2.32* 2.63* 2.32*  CALCIUM  8.9   < > 8.9 9.2 9.4 9.1 8.8*  MG 2.0  --   --   --   --  2.3  --    < > = values in this interval not displayed.   GFR: Estimated Creatinine Clearance: 51 mL/min (A) (by C-G formula based on SCr of 2.32 mg/dL (H)). Liver Function Tests: Recent Labs  Lab 03/22/24 0653  AST 24  ALT 31  ALKPHOS 102  BILITOT 1.9*  PROT 6.3*  ALBUMIN 2.9*   No results for input(s): "LIPASE", "AMYLASE" in the last 168 hours. No results for input(s): "AMMONIA" in the last 168 hours. Coagulation Profile: No results for input(s): "INR", "PROTIME" in the last 168 hours. Cardiac Enzymes: No results for input(s): "CKTOTAL", "CKMB", "CKMBINDEX", "TROPONINI" in the last 168 hours. BNP (last 3 results) No results for input(s):  "PROBNP" in the last 8760 hours. HbA1C: No results for input(s): "HGBA1C" in the last 72 hours. CBG: Recent Labs  Lab 03/25/24 0511 03/26/24 0315 03/27/24 0812  GLUCAP 120* 102* 107*   Lipid Profile: No results for input(s): "CHOL", "HDL", "LDLCALC", "TRIG", "CHOLHDL", "LDLDIRECT" in the last 72 hours. Thyroid  Function Tests: No results for input(s): "TSH", "T4TOTAL", "FREET4", "T3FREE", "THYROIDAB" in the last 72 hours. Anemia Panel: No results for input(s): "VITAMINB12", "FOLATE", "FERRITIN", "TIBC", "IRON", "RETICCTPCT" in the last 72 hours. Most Recent Urinalysis On File:     Component Value Date/Time   COLORURINE AMBER (A) 10/05/2023 0230   APPEARANCEUR CLEAR 10/05/2023 0230   LABSPEC 1.008 10/05/2023 0230   PHURINE 7.0 10/05/2023 0230   GLUCOSEU >=500 (A) 10/05/2023 0230   HGBUR NEGATIVE 10/05/2023 0230   BILIRUBINUR NEGATIVE 10/05/2023 0230   KETONESUR NEGATIVE 10/05/2023 0230   PROTEINUR NEGATIVE 10/05/2023 0230   NITRITE NEGATIVE 10/05/2023 0230   LEUKOCYTESUR NEGATIVE 10/05/2023 0230   Sepsis Labs: @LABRCNTIP (procalcitonin:4,lacticidven:4) Microbiology: No results found for this or any previous visit (from the past 240 hours).    Radiology Studies last 3 days: No results found.     Time spent: 35 min     Bernie Ransford, DO [facility] 03/27/2024, 4:36 PM    Dictation software may have been used to generate the above note. Typos may occur and escape review in typed/dictated notes. Please contact Dr Authur Leghorn directly for clarity if needed.  Staff may message me via secure chat in Epic  but this may not receive an immediate response,  please page me for urgent matters!  If 7PM-7AM, please contact night coverage [email or web address]

## 2024-03-28 DIAGNOSIS — Z4502 Encounter for adjustment and management of automatic implantable cardiac defibrillator: Secondary | ICD-10-CM | POA: Diagnosis not present

## 2024-03-28 LAB — BASIC METABOLIC PANEL WITH GFR
Anion gap: 10 (ref 5–15)
BUN: 53 mg/dL — ABNORMAL HIGH (ref 6–20)
CO2: 28 mmol/L (ref 22–32)
Calcium: 8.8 mg/dL — ABNORMAL LOW (ref 8.9–10.3)
Chloride: 96 mmol/L — ABNORMAL LOW (ref 98–111)
Creatinine, Ser: 2.57 mg/dL — ABNORMAL HIGH (ref 0.61–1.24)
GFR, Estimated: 31 mL/min — ABNORMAL LOW (ref >60)
Glucose, Bld: 113 mg/dL — ABNORMAL HIGH (ref 70–99)
Potassium: 3.4 mmol/L — ABNORMAL LOW (ref 3.5–5.1)
Sodium: 134 mmol/L — ABNORMAL LOW (ref 135–145)

## 2024-03-28 LAB — GLUCOSE, CAPILLARY: Glucose-Capillary: 156 mg/dL — ABNORMAL HIGH (ref 70–99)

## 2024-03-28 MED ORDER — POTASSIUM CHLORIDE CRYS ER 20 MEQ PO TBCR
60.0000 meq | EXTENDED_RELEASE_TABLET | Freq: Every day | ORAL | Status: DC
  Administered 2024-03-28: 60 meq via ORAL
  Filled 2024-03-28: qty 3

## 2024-03-28 MED ORDER — POTASSIUM CHLORIDE CRYS ER 20 MEQ PO TBCR
80.0000 meq | EXTENDED_RELEASE_TABLET | Freq: Two times a day (BID) | ORAL | Status: DC
  Administered 2024-03-28: 80 meq via ORAL
  Filled 2024-03-28: qty 4

## 2024-03-28 MED ORDER — CALCIUM CARBONATE ANTACID 500 MG PO CHEW: 400.0000 mg | CHEWABLE_TABLET | Freq: Three times a day (TID) | ORAL | Status: DC | PRN

## 2024-03-28 NOTE — Progress Notes (Addendum)
 [facility]  ROUNDING NOTE   Subjective:   Patient seen resting in bed Remains on room air Denies shortness of breath or dizziness  Potassium 3.4 Creatinine 2.57  Objective:  Vital signs in last 24 hours:  Temp:  [97.5 F (36.4 C)-98.3 F (36.8 C)] 98 F (36.7 C) (05/15 0741) Pulse Rate:  [60-67] 60 (05/15 0741) Resp:  [18] 18 (05/15 0741) BP: (81-112)/(53-76) 106/68 (05/15 0900) SpO2:  [92 %-100 %] 100 % (05/15 0741) Weight:  [107.8 kg] 107.8 kg (05/15 0414)  Weight change: -2.722 kg Filed Weights   03/26/24 0248 03/27/24 0215 03/28/24 0414  Weight: 108.2 kg 110.5 kg 107.8 kg    Intake/Output: I/O last 3 completed shifts: In: 720 [P.O.:720] Out: -    Intake/Output this shift:  No intake/output data recorded.  Physical Exam: General: NAD  Head: Normocephalic, atraumatic. Moist oral mucosal membranes  Eyes: Anicteric  Lungs:  Clear to auscultation, normal effort  Heart: Regular rate and rhythm  Abdomen:  Soft, nontender  Extremities: No peripheral edema.  Neurologic: Alert, moving all four extremities  Skin: No lesions  Access: None    Basic Metabolic Panel: Recent Labs  Lab 03/24/24 0434 03/25/24 0602 03/26/24 0521 03/27/24 0610 03/28/24 0453  NA 135 138 134* 132* 134*  K 3.1* 2.9* 3.6 4.2 3.4*  CL 93* 96* 95* 103 96*  CO2 29 34* 26 23 28   GLUCOSE 175* 94 99 116* 113*  BUN 44* 43* 46* 50* 53*  CREATININE 2.37* 2.32* 2.63* 2.32* 2.57*  CALCIUM  9.2 9.4 9.1 8.8* 8.8*  MG  --   --  2.3  --   --     Liver Function Tests: Recent Labs  Lab 03/22/24 0653  AST 24  ALT 31  ALKPHOS 102  BILITOT 1.9*  PROT 6.3*  ALBUMIN 2.9*   No results for input(s): "LIPASE", "AMYLASE" in the last 168 hours. No results for input(s): "AMMONIA" in the last 168 hours.  CBC: Recent Labs  Lab 03/23/24 0324 03/24/24 0434 03/25/24 0602 03/26/24 0521 03/27/24 0610  WBC 6.6 5.8 5.2 5.7 6.7  NEUTROABS 5.5 4.6 4.0 4.4 5.3  HGB 14.4 14.6 14.5 14.2  13.6  HCT 46.5 47.3 46.7 46.2 43.8  MCV 77.1* 77.2* 75.8* 77.3* 77.7*  PLT 295 275 269 259 251    Cardiac Enzymes: No results for input(s): "CKTOTAL", "CKMB", "CKMBINDEX", "TROPONINI" in the last 168 hours.  BNP: Invalid input(s): "POCBNP"  CBG: Recent Labs  Lab 03/25/24 0511 03/26/24 0315 03/27/24 0812 03/28/24 0449  GLUCAP 120* 102* 107* 156*    Microbiology: Results for orders placed or performed during the hospital encounter of 09/29/23  Culture, blood (Routine X 2) w Reflex to ID Panel     Status: None   Collection Time: 10/05/23 12:02 AM   Specimen: BLOOD RIGHT ARM  Result Value Ref Range Status   Specimen Description BLOOD RIGHT ARM  Final   Special Requests   Final    BOTTLES DRAWN AEROBIC AND ANAEROBIC Blood Culture results may not be optimal due to an excessive volume of blood received in culture bottles   Culture   Final    NO GROWTH 5 DAYS Performed at [facility], [address], [place], [place] [address]    Report Status 10/10/2023 FINAL  Final  Culture, blood (Routine X 2) w Reflex to ID Panel     Status: None   Collection Time: 10/05/23 12:05 AM   Specimen: BLOOD LEFT ARM  Result  Value Ref Range Status   Specimen Description BLOOD LEFT ARM  Final   Special Requests   Final    BOTTLES DRAWN AEROBIC AND ANAEROBIC Blood Culture results may not be optimal due to an excessive volume of blood received in culture bottles   Culture   Final    NO GROWTH 5 DAYS Performed at [facility], [address] [place], [place] [address]    Report Status 10/10/2023 FINAL  Final  Urine Culture (for pregnant, neutropenic or urologic patients or patients with an indwelling urinary catheter)     Status: Abnormal   Collection Time: 10/05/23  5:42 AM   Specimen: Urine, Clean Catch  Result Value Ref Range Status   Specimen Description URINE, CLEAN CATCH  Final   Special Requests NONE  Final   Culture (A)  Final    <10,000 COLONIES/mL INSIGNIFICANT  GROWTH Performed at [facility], [address], [place], [place] [address]    Report Status 10/06/2023 FINAL  Final  Respiratory (~20 pathogens) panel by PCR     Status: None   Collection Time: 10/05/23 12:02 PM   Specimen: Nasopharyngeal Swab; Respiratory  Result Value Ref Range Status   Adenovirus NOT DETECTED NOT DETECTED Final   Coronavirus 229E NOT DETECTED NOT DETECTED Final    Comment: (NOTE) The Coronavirus on the Respiratory Panel, DOES NOT test for the novel  Coronavirus (2019 nCoV)    Coronavirus HKU1 NOT DETECTED NOT DETECTED Final   Coronavirus NL63 NOT DETECTED NOT DETECTED Final   Coronavirus OC43 NOT DETECTED NOT DETECTED Final   Metapneumovirus NOT DETECTED NOT DETECTED Final   Rhinovirus / Enterovirus NOT DETECTED NOT DETECTED Final   Influenza A NOT DETECTED NOT DETECTED Final   Influenza B NOT DETECTED NOT DETECTED Final   Parainfluenza Virus 1 NOT DETECTED NOT DETECTED Final   Parainfluenza Virus 2 NOT DETECTED NOT DETECTED Final   Parainfluenza Virus 3 NOT DETECTED NOT DETECTED Final   Parainfluenza Virus 4 NOT DETECTED NOT DETECTED Final   Respiratory Syncytial Virus NOT DETECTED NOT DETECTED Final   Bordetella pertussis NOT DETECTED NOT DETECTED Final   Bordetella Parapertussis NOT DETECTED NOT DETECTED Final   Chlamydophila pneumoniae NOT DETECTED NOT DETECTED Final   Mycoplasma pneumoniae NOT DETECTED NOT DETECTED Final    Comment: Performed at [facility], [address], [place], [place] [address]    Coagulation Studies: No results for input(s): "LABPROT", "INR" in the last 72 hours.  Urinalysis: No results for input(s): "COLORURINE", "LABSPEC", "PHURINE", "GLUCOSEU", "HGBUR", "BILIRUBINUR", "KETONESUR", "PROTEINUR", "UROBILINOGEN", "NITRITE", "LEUKOCYTESUR" in the last 72 hours.  Invalid input(s): "APPERANCEUR"    Imaging: No results found.   Medications:     allopurinol   100 mg Oral Daily   amiodarone   200 mg Oral BID    apixaban   5 mg Oral BID   atorvastatin   40 mg Oral Daily   empagliflozin   10 mg Oral Daily   levothyroxine   50 mcg Oral Q0600   metoprolol  succinate  25 mg Oral Daily   mexiletine  150 mg Oral Q12H   pantoprazole   40 mg Oral Daily   potassium chloride   60 mEq Oral TID   sodium chloride  flush  3 mL Intravenous Q12H   spironolactone   100 mg Oral Daily   torsemide   60 mg Oral BID   acetaminophen , calcium  carbonate, ondansetron  **OR** ondansetron  (ZOFRAN ) IV  Assessment/ Plan:  Bradley Powell is a 43 y.o.  male with  past medical conditions including chronic combined CHF, CKD, AFL, defibrillator, who was admitted to [facility] on 03/21/2024 for Hypokalemia [E87.6] AICD discharge [Z45.02] Syncope, unspecified syncope type [R55]    Chronic kidney disease stage IIIb, baseline GFR appears to be 37-38.  GFR currently 47.  Creatinine bumped today, not unexpected with metolazone  dosing yesterday.  Weight decreased by 5 pounds since yesterday.  Will continue to monitor for now.  Patient will need follow-up with [facility] [place] at discharge.   2. Hypokalemia due to insufficient supplementation.  Home regimen includes potassium chloride  80 mill equivalents twice a day.  Patient currently prescribed potassium chloride  60mEq twice daily, Appreciate consulting cardiology to discuss home regimen.  Patient currently prescribed torsemide  60 mg twice daily.  Primary team has ordered 40 mill equivalents of IV supplementation.  Spironolactone  100 mg daily.   Potassium 3.4, continue prescribed oral supplementation.  We have nothing additional to add at this time so we will sign off.  Feel free to consult if needed.    LOS: 4 Brit Wernette 5/15/202512:20 PM

## 2024-03-28 NOTE — Progress Notes (Signed)
 Entered room and introduced myself as RN who helps with admissions and discharges. Patient is in bed with his parents at bedside.   Explained to patient medical readiness day is tomorrow (03/29/24) and this means we hope he  is stable and ready for discharge at that time. Patient agrees he  hopes he  is ready. Patient states his parents can be here to transport patient home when medically ready. He has clothes to wear home. Explained this date is tentative and based on clinical status and final discharge determination will be made by his provider. Patient voiced understanding.  At this time, there are no social barriers to discharge.

## 2024-03-28 NOTE — Progress Notes (Signed)
 PROGRESS NOTE    Bradley Powell   WUX:[id number] DOB: [birth year]  DOA: 03/21/2024 Date of Service: 03/28/24 which is hospital day 4  PCP: Bradley Star, MD    Hospital course / significant events:   Bradley Powell is a 43 y.o. male with medical history significant of recurrent VT, chronic combined CHF, AFL, CKD, issues with non-compliance and  hypokalemia presenting with syncope and hypokalemia.   Noted history of similar episodes associated with hypokalemia as well as recurrent V. tach. Patient with ICD in place. Patient states he did feel like it possibly discharge.   Noted recent admission May 1 to May 3 on the cardiology EP service with issues concerning for ICD shocking as well as V. Tach and hypokalemia. Symptoms felt to be predominantly associated with hypokalemia. Had repletion with resolution of symptoms.   05/08: to ED w/ syncope and hypokalemia. Pacemaker interrogation with noted shock therapy at 0325 today. Also with antitachycardia pacing. K 3.1. Cr baseline.  05/09: electrophys consult - goal K >4. Nephro consult. Question underlying RTA vs low K d/t high dose diuretics.  05/10: K 4.1 - restarted diuretics and spiro, restart K supplementation 80 in AM and 60 in PM. Since patient is high risk of arrhythmia he cannot be discharged until his potassium level stabilizes with initiation of diuretic therapy. Metolazone  added today. His device was interrogated and reprogrammed by EP 05/11: K down to 3.1  05/12: K down to 2.9  05/13: K up to 3.6, will see if this maintains 05/14: K up to 4.2, per cardiology start K supplement at 60 mEq tid, will see if this maintains  05/15: K down again to 3.4, will trial 80 mEq morning/evening + 60 mEq mid-day   Consultants:  Cardiology / Heart Failure / EP teams Nephrology   Procedures/Surgeries: none      ASSESSMENT & PLAN:   Syncope VT  s/p ICD 4/23. VT storm 1/25 in setting of hypokalemia. Admitted again 02/17/24 with VT  treated with shock in the setting of hypokalemia (he was out of spirolactone). Admitted 05/01 with VT treated with shock (hypokalemia again noted). Now admitted with syncope and VT s/p ATP and shock in setting of hypokalemia.  EP has seen. Device reprogrammed.  outpatient cardiac PET given concern for sarcoid on prior MRI Continue mexiletine 150 mg bid   Continue amiodarone  200 bid.  Continue low-dose metoprolol  succinate 25 mg daily Potassium management as below   Persistent hypokalemia despite repletion suspect patient may be having underlying RTA patient is high risk of arrhythmia - he cannot be discharged until his potassium level stabilizes with initiation of diuretic therapy will trial KCl 80 mEq morning/evening + 60 mEq mid-day spironolactone  to 100mg  daily Nephrology consulted, following Keep K > 4 and Mag > 2 Monitor BMP   Atrial fibrillation, chronic  AF/AFL S/p AFL ablation 5/19 with Bradley Powell. Rate controlled at present  Continue Amio per above  Continue Eliquis  5 mg bid.    Chronic HFrEF, NYHA II NICM (nonischemic cardiomyopathy)  2D echo 11/03/2023 with EF 35 to 40% Has seen geneticist, Bradley Powell. Work up w/ likely pathogenic gene variant (unable to see full report).  Given recurrent VT and concern for sarcoidosis on prior cMRI, EP recommending cardiac PET as outpatient.  consider device upgrade to CRT-D d/t AV dissociation. toresmide to 60mg  daily today spironolactone  as above Continue Jardiance  10 mg daily.  Continue low dose metoprolol  No ARNi and ARB (hypotension and N/V in the past). No  Bidil  (hypotension in the past) Follow w/ cardiology   CKD (chronic kidney disease), stage IIIa Baseline SCr 2.0-2.3. Creatinine slightly worse yesterday, better again today  Nephrologist on board we appreciate input Monitor BMP  Elevated troponin Troponin 70s in the setting of syncopal event No chest pains Monitor on telemetry closely   ICD (implantable  cardioverter-defibrillator) in place Status post ICD placement in the setting of AV block Formally interrogated in the ER  Follow w/ cardiology   Hypothyroidism likely amio-induced continue levothyroxine    Class 2 obesity based on BMI: Body mass index is 37.04 kg/m.Bradley Powell Significantly low or high BMI is associated with higher medical risk.  Underweight - under 18  overweight - 25 to 29 obese - 30 or more Class 1 obesity: BMI of 30.0 to 34 Class 2 obesity: BMI of 35.0 to 39 Class 3 obesity: BMI of 40.0 to 49 Super Morbid Obesity: BMI 50-59 Super-super Morbid Obesity: BMI 60+ Healthy nutrition and physical activity advised as adjunct to other disease management and risk reduction treatments    DVT prophylaxis: eliquis  IV fluids: no continuous IV fluids  Nutrition: cardiac Central lines / other devices: none  Code Status: FULL CODE ACP documentation reviewed:  none on file in [facility]  TOC needs: none at this time Medical barriers to dispo: potassium levels. Expected medical readiness for discharge once K stable / per cardiology clearance.              Subjective / Brief ROS:  Patient reports no concerns other than neck/shoulder pain, chronic  Denies CP/SOB.  Pain controlled.  Denies new weakness.  Tolerating diet.  Reports no concerns w/ urination/defecation.   Family Communication: none at this itme     Objective Findings:  Vitals:   03/28/24 0332 03/28/24 0414 03/28/24 0741 03/28/24 0900  BP: 99/66  91/62 106/68  Pulse: 67  60   Resp: 18  18   Temp: 98.3 F (36.8 C)  98 F (36.7 C)   TempSrc: Oral     SpO2: 100%  100%   Weight:  107.8 kg    Height:       No intake or output data in the 24 hours ending 03/28/24 1437  Filed Weights   03/26/24 0248 03/27/24 0215 03/28/24 0414  Weight: 108.2 kg 110.5 kg 107.8 kg    Examination:  Physical Exam Constitutional:      General: He is not in acute distress.    Appearance: He is not ill-appearing.   Cardiovascular:     Rate and Rhythm: Normal rate and regular rhythm.  Pulmonary:     Effort: Pulmonary effort is normal.     Breath sounds: Normal breath sounds.  Musculoskeletal:     Right lower leg: No edema.     Left lower leg: No edema.  Neurological:     General: No focal deficit present.     Mental Status: He is alert and oriented to person, place, and time.  Psychiatric:        Mood and Affect: Mood normal.        Behavior: Behavior normal.          Scheduled Medications:   allopurinol   100 mg Oral Daily   amiodarone   200 mg Oral BID   apixaban   5 mg Oral BID   atorvastatin   40 mg Oral Daily   empagliflozin   10 mg Oral Daily   levothyroxine   50 mcg Oral Q0600   metoprolol  succinate  25 mg Oral Daily  mexiletine  150 mg Oral Q12H   pantoprazole   40 mg Oral Daily   potassium chloride   60 mEq Oral Daily   potassium chloride   80 mEq Oral BID   sodium chloride  flush  3 mL Intravenous Q12H   spironolactone   100 mg Oral Daily   torsemide   60 mg Oral BID    Continuous Infusions:   PRN Medications:  acetaminophen , calcium  carbonate, ondansetron  **OR** ondansetron  (ZOFRAN ) IV  Antimicrobials from admission:  Anti-infectives (From admission, onward)    None           Data Reviewed:  I have personally reviewed the following...  CBC: Recent Labs  Lab 03/23/24 0324 03/24/24 0434 03/25/24 0602 03/26/24 0521 03/27/24 0610  WBC 6.6 5.8 5.2 5.7 6.7  NEUTROABS 5.5 4.6 4.0 4.4 5.3  HGB 14.4 14.6 14.5 14.2 13.6  HCT 46.5 47.3 46.7 46.2 43.8  MCV 77.1* 77.2* 75.8* 77.3* 77.7*  PLT 295 275 269 259 251   Basic Metabolic Panel: Recent Labs  Lab 03/24/24 0434 03/25/24 0602 03/26/24 0521 03/27/24 0610 03/28/24 0453  NA 135 138 134* 132* 134*  K 3.1* 2.9* 3.6 4.2 3.4*  CL 93* 96* 95* 103 96*  CO2 29 34* 26 23 28   GLUCOSE 175* 94 99 116* 113*  BUN 44* 43* 46* 50* 53*  CREATININE 2.37* 2.32* 2.63* 2.32* 2.57*  CALCIUM  9.2 9.4 9.1 8.8* 8.8*  MG   --   --  2.3  --   --    GFR: Estimated Creatinine Clearance: 45.5 mL/min (A) (by C-G formula based on SCr of 2.57 mg/dL (H)). Liver Function Tests: Recent Labs  Lab 03/22/24 0653  AST 24  ALT 31  ALKPHOS 102  BILITOT 1.9*  PROT 6.3*  ALBUMIN 2.9*   No results for input(s): "LIPASE", "AMYLASE" in the last 168 hours. No results for input(s): "AMMONIA" in the last 168 hours. Coagulation Profile: No results for input(s): "INR", "PROTIME" in the last 168 hours. Cardiac Enzymes: No results for input(s): "CKTOTAL", "CKMB", "CKMBINDEX", "TROPONINI" in the last 168 hours. BNP (last 3 results) No results for input(s): "PROBNP" in the last 8760 hours. HbA1C: No results for input(s): "HGBA1C" in the last 72 hours. CBG: Recent Labs  Lab 03/25/24 0511 03/26/24 0315 03/27/24 0812 03/28/24 0449  GLUCAP 120* 102* 107* 156*   Lipid Profile: No results for input(s): "CHOL", "HDL", "LDLCALC", "TRIG", "CHOLHDL", "LDLDIRECT" in the last 72 hours. Thyroid  Function Tests: No results for input(s): "TSH", "T4TOTAL", "FREET4", "T3FREE", "THYROIDAB" in the last 72 hours. Anemia Panel: No results for input(s): "VITAMINB12", "FOLATE", "FERRITIN", "TIBC", "IRON", "RETICCTPCT" in the last 72 hours. Most Recent Urinalysis On File:     Component Value Date/Time   COLORURINE AMBER (A) 10/05/2023 0230   APPEARANCEUR CLEAR 10/05/2023 0230   LABSPEC 1.008 10/05/2023 0230   PHURINE 7.0 10/05/2023 0230   GLUCOSEU >=500 (A) 10/05/2023 0230   HGBUR NEGATIVE 10/05/2023 0230   BILIRUBINUR NEGATIVE 10/05/2023 0230   KETONESUR NEGATIVE 10/05/2023 0230   PROTEINUR NEGATIVE 10/05/2023 0230   NITRITE NEGATIVE 10/05/2023 0230   LEUKOCYTESUR NEGATIVE 10/05/2023 0230   Sepsis Labs: @LABRCNTIP (procalcitonin:4,lacticidven:4) Microbiology: No results found for this or any previous visit (from the past 240 hours).    Radiology Studies last 3 days: No results found.     Time spent: 35 min     Cydni Reddoch, DO [facility] 03/28/2024, 2:37 PM    Dictation software may have been used to generate the above note. Typos may occur  and escape review in typed/dictated notes. Please contact Dr Authur Leghorn directly for clarity if needed.  Staff may message me via secure chat in Epic  but this may not receive an immediate response,  please page me for urgent matters!  If 7PM-7AM, please contact night coverage [email or web address]

## 2024-03-28 NOTE — Progress Notes (Signed)
 Advanced Heart Failure Rounding Note  Cardiologist: None  Chief Complaint:  Subjective:    Expected drop in K with metolazone , down to 3.4. Weight improved. dIscussed limited metolazone  to once weekly at home, with only an additional half dose prn. Otherwise will continue current regimen, if stable tomrrow can potentially discharge.    Objective:   Weight Range: 107.8 kg Body mass index is 36.13 kg/m.   Vital Signs:   Temp:  [97.5 F (36.4 C)-98.3 F (36.8 C)] 98.2 F (36.8 C) (05/15 1549) Pulse Rate:  [60-67] 66 (05/15 1549) Resp:  [18-19] 19 (05/15 1549) BP: (81-112)/(53-76) 97/68 (05/15 1549) SpO2:  [92 %-100 %] 96 % (05/15 1549) Weight:  [107.8 kg] 107.8 kg (05/15 0414) Last BM Date : 03/27/24  Weight change: Filed Weights   03/26/24 0248 03/27/24 0215 03/28/24 0414  Weight: 108.2 kg 110.5 kg 107.8 kg    Intake/Output:  No intake or output data in the 24 hours ending 03/28/24 1615     Physical Exam    General: NAD Lungs: Clear to auscultation bilaterally with normal respiratory effort. CV: Heart regular, no murmur, improved JVP, trace LE edema Abdomen: soft, no distension Neurologic: awake/alert, no gross FND.    Telemetry   Sinus in the 60s   Imaging    No results found.   Medications:     Scheduled Medications:  allopurinol   100 mg Oral Daily   amiodarone   200 mg Oral BID   apixaban   5 mg Oral BID   atorvastatin   40 mg Oral Daily   empagliflozin   10 mg Oral Daily   levothyroxine   50 mcg Oral Q0600   metoprolol  succinate  25 mg Oral Daily   mexiletine  150 mg Oral Q12H   pantoprazole   40 mg Oral Daily   potassium chloride   60 mEq Oral Daily   potassium chloride   80 mEq Oral BID   sodium chloride  flush  3 mL Intravenous Q12H   spironolactone   100 mg Oral Daily   torsemide   60 mg Oral BID    Infusions:   PRN Medications: acetaminophen , calcium  carbonate, ondansetron  **OR** ondansetron  (ZOFRAN ) IV    Patient Profile     Bradley Powell is seen today for evaluation of VT and chronic HFrEF at the request of Bradley Powell with [facility].   Assessment/Plan   Syncope VT  - s/p ICD 4/23. - VT storm 1/25 in setting of hypokalemia - Admitted again 02/17/24 with VT treated with shock in the setting of hypokalemia (he was out of spirolactone) - Admitted 05/01 with VT treated with shock (hypokalemia again noted) - Now admitted with syncope and VT s/p ATP and shock in setting of hypokalemia. EP has seen. Device reprogrammed.  - Will need outpatient cardiac PET given concern for sarcoid on prior MRI, to be arranged - Continue mexiletine 150 mg bid   - Continue amiodarone  200 bid.  - Continue low-dose metoprolol  succinate 25 mg daily   Hypokalemia - K now 3.4 after augmentation - PO K supp. Continue with 60mEq TID, would not adjust given metolazone   - Continue spironolactone  100mg  daily - Torsemide  60mg  BID -Nephrology consulted, signed off today - Keep K > 4 and Mag > 2  Chronic HFrEF - Has seen geneticist, Bradley Powell. Work up w/ likely pathogenic gene variant (unable to see full report) - Given recurrent VT and concern for sarcoidosis on prior cMRI, EP recommending cardiac PET as outpatient. Also consider device upgrade to CRT-D d/t  AV dissociation. - NYHA II  -Return to home dosing, torsemide  60mg  BID with metolazone  today - Cotinue spironolactone  100mg  daily - Continue Jardiance  10 mg daily.  - Continue low dose metoprolol  - No ARNi and ARB (hypotension and N/V in the past). - No Bidil  (hypotension in the past)   CKD Stage IIIb - Baseline SCr 2.0-2.3 - Creatinine mildly elevated in the setting of diuresis   AF/AFL - S/p AFL ablation 5/19 with Bradley Powell. - Continue Amio per above  - Continue Eliquis  5 mg bid.    Obesity  - BMI 36 - Eventually consider GLP-1 RA   Hypothyroidism - likely amio-induced - continue levothyroxine   Medication concerns reviewed with patient and pharmacy  team. Barriers identified: hypokalemia.  Plan for discharge tomorrow  Length of Stay: 4  [name], MD  03/28/2024, 4:15 PM  Advanced Heart Failure Team Pager [phone] (M-F; 7a - 5p)  Please contact [facility] for night-coverage after hours (5p -7a ) and weekends on [email or web address]

## 2024-03-28 NOTE — Plan of Care (Signed)
   Problem: Education: Goal: Knowledge of General Education information will improve Description Including pain rating scale, medication(s)/side effects and non-pharmacologic comfort measures Outcome: Progressing

## 2024-03-29 DIAGNOSIS — Z4502 Encounter for adjustment and management of automatic implantable cardiac defibrillator: Secondary | ICD-10-CM | POA: Diagnosis not present

## 2024-03-29 LAB — BASIC METABOLIC PANEL WITH GFR
Anion gap: 12 (ref 5–15)
Anion gap: 11 (ref 5–15)
BUN: 58 mg/dL — ABNORMAL HIGH (ref 6–20)
BUN: 57 mg/dL — ABNORMAL HIGH (ref 6–20)
CO2: 26 mmol/L (ref 22–32)
CO2: 27 mmol/L (ref 22–32)
Calcium: 9.1 mg/dL (ref 8.9–10.3)
Calcium: 9.2 mg/dL (ref 8.9–10.3)
Chloride: 95 mmol/L — ABNORMAL LOW (ref 98–111)
Chloride: 95 mmol/L — ABNORMAL LOW (ref 98–111)
Creatinine, Ser: 2.6 mg/dL — ABNORMAL HIGH (ref 0.61–1.24)
Creatinine, Ser: 2.48 mg/dL — ABNORMAL HIGH (ref 0.61–1.24)
GFR, Estimated: 31 mL/min — ABNORMAL LOW (ref >60)
GFR, Estimated: 33 mL/min — ABNORMAL LOW
Glucose, Bld: 143 mg/dL — ABNORMAL HIGH (ref 70–99)
Glucose, Bld: 110 mg/dL — ABNORMAL HIGH (ref 70–99)
Potassium: 3.7 mmol/L (ref 3.5–5.1)
Potassium: 3.2 mmol/L — ABNORMAL LOW (ref 3.5–5.1)
Sodium: 133 mmol/L — ABNORMAL LOW (ref 135–145)
Sodium: 133 mmol/L — ABNORMAL LOW (ref 135–145)

## 2024-03-29 LAB — GLUCOSE, CAPILLARY: Glucose-Capillary: 124 mg/dL — ABNORMAL HIGH (ref 70–99)

## 2024-03-29 MED ORDER — POTASSIUM CHLORIDE CRYS ER 20 MEQ PO TBCR
60.0000 meq | EXTENDED_RELEASE_TABLET | Freq: Three times a day (TID) | ORAL | Status: DC
  Administered 2024-03-29 (×2): 60 meq via ORAL
  Filled 2024-03-29 (×2): qty 3

## 2024-03-29 MED ORDER — POTASSIUM CHLORIDE CRYS ER 20 MEQ PO TBCR
60.0000 meq | EXTENDED_RELEASE_TABLET | Freq: Every day | ORAL | Status: DC
  Administered 2024-03-30: 60 meq via ORAL
  Filled 2024-03-29: qty 3

## 2024-03-29 MED ORDER — POTASSIUM CHLORIDE CRYS ER 20 MEQ PO TBCR
80.0000 meq | EXTENDED_RELEASE_TABLET | Freq: Two times a day (BID) | ORAL | Status: DC
  Administered 2024-03-29 – 2024-03-30 (×2): 80 meq via ORAL
  Filled 2024-03-29 (×2): qty 4

## 2024-03-29 NOTE — Progress Notes (Signed)
 PROGRESS NOTE    Bradley Powell   ZOX:[id number] DOB: Oct 18, 1981  DOA: 03/21/2024 Date of Service: 03/29/24 which is hospital day 5  PCP: Merl Star, MD    Hospital course / significant events:   Bradley Powell is a 43 y.o. male with medical history significant of recurrent VT, chronic combined CHF, AFL, CKD, issues with non-compliance and  hypokalemia presenting with syncope and hypokalemia.   Noted history of similar episodes associated with hypokalemia as well as recurrent V. tach. Patient with ICD in place. Patient states he did feel like it possibly discharge.   Noted recent admission May 1 to May 3 on the cardiology EP service with issues concerning for ICD shocking as well as V. Tach and hypokalemia. Symptoms felt to be predominantly associated with hypokalemia. Had repletion with resolution of symptoms.   05/08: to ED w/ syncope and hypokalemia. Pacemaker interrogation with noted shock therapy at 0325 today. Also with antitachycardia pacing. K 3.1. Cr baseline.  05/09: electrophys consult - goal K >4. Nephro consult. Question underlying RTA vs low K d/t high dose diuretics.  05/10: K 4.1 - restarted diuretics and spiro, restart K supplementation 80 in AM and 60 in PM. Since patient is high risk of arrhythmia he cannot be discharged until his potassium level stabilizes with initiation of diuretic therapy. Metolazone  added today. His device was interrogated and reprogrammed by EP 05/11: K down to 3.1  05/12: K down to 2.9  05/13: K up to 3.6, will see if this maintains 05/14: K up to 4.2, per cardiology start K supplement at 60 mEq tid, will see if this maintains  05/15: K down again to 3.4, will see how does w/ metolazone  05/16: K drop again this afternoon, increase to 80/60/80 dosing tid    Consultants:  Cardiology / Heart Failure / EP teams Nephrology   Procedures/Surgeries: none      ASSESSMENT & PLAN:   Tentative discharge medications per cardiology   Amiodarone  200mg  BID Apixaban  5mg  BID Atorvastatin  40mg  daily Empagliflozin  10mg  daily Metoprolol  succinate 25mg  daily Mexiletine 150mg  BID Torsemide  60mg  BID Spironolactone  100mg  daily Kcl 60mg  mid-day KCl 80 mg AM and PM Metolazone  5mg  weekly on wednesday  Syncope VT  s/p ICD 4/23. VT storm 1/25 in setting of hypokalemia. Admitted again 02/17/24 with VT treated with shock in the setting of hypokalemia (he was out of spirolactone). Admitted 05/01 with VT treated with shock (hypokalemia again noted). Now admitted with syncope and VT s/p ATP and shock in setting of hypokalemia.  EP has seen. Device reprogrammed.  outpatient cardiac PET given concern for sarcoid on prior MRI Meds as above  Potassium management as below   Persistent hypokalemia despite repletion suspect patient may be having underlying RTA patient is high risk of arrhythmia - he cannot be discharged until his potassium level stabilizes  will trial KCl 80 mEq morning/evening + 60 mEq mid-day Keep K > 4 and Mag > 2 Monitor BMP   Atrial fibrillation, chronic  AF/AFL S/p AFL ablation 5/19 with Dr. Carolynne Citron. Rate controlled at present  Continue Amio per above  Continue Eliquis  5 mg bid.    Chronic HFrEF, NYHA II NICM (nonischemic cardiomyopathy)  2D echo 11/03/2023 with EF 35 to 40% Has seen geneticist, Dr. Drexel Gentles. Work up w/ likely pathogenic gene variant (unable to see full report).  Given recurrent VT and concern for sarcoidosis on prior cMRI, EP recommending cardiac PET as outpatient.  consider device upgrade to CRT-D d/t AV dissociation.  Meds as above No ARNi and ARB (hypotension and N/V in the past). No Bidil  (hypotension in the past) Follow w/ cardiology   CKD (chronic kidney disease), stage IIIa Monitor BMP  ICD (implantable cardioverter-defibrillator) in place Status post ICD placement in the setting of AV block Formally interrogated in the ER  Follow w/ cardiology   Hypothyroidism likely  amio-induced continue levothyroxine    Class 2 obesity based on BMI: Body mass index is 37.04 kg/m.[name] Significantly low or high BMI is associated with higher medical risk.  Underweight - under 18  overweight - 25 to 29 obese - 30 or more Class 1 obesity: BMI of 30.0 to 34 Class 2 obesity: BMI of 35.0 to 39 Class 3 obesity: BMI of 40.0 to 49 Super Morbid Obesity: BMI 50-59 Super-super Morbid Obesity: BMI 60+ Healthy nutrition and physical activity advised as adjunct to other disease management and risk reduction treatments    DVT prophylaxis: eliquis  IV fluids: no continuous IV fluids  Nutrition: cardiac Central lines / other devices: none  Code Status: FULL CODE ACP documentation reviewed:  none on file in [facility]  TOC needs: none at this time Medical barriers to dispo: potassium levels. Expected medical readiness for discharge once K stable / per cardiology clearance - hopefully tomorrow / Sunday.              Subjective / Brief ROS:  Patient reports no concerns other than neck/shoulder pain, chronic  This afternoon concerned about cramps and K down to 3.2  Denies CP/SOB.  Pain controlled.  Denies new weakness.  Tolerating diet.  Reports no concerns w/ urination/defecation.   Family Communication: none at this itme     Objective Findings:  Vitals:   03/29/24 0500 03/29/24 0837 03/29/24 0930 03/29/24 1519  BP:  (!) 87/60 99/73 95/68   Pulse:  68  67  Resp:  19  19  Temp:  98.5 F (36.9 C)  98.2 F (36.8 C)  TempSrc:  Oral  Oral  SpO2:  100%  99%  Weight: 108.5 kg     Height:        Intake/Output Summary (Last 24 hours) at 03/29/2024 1654 Last data filed at 03/29/2024 1300 Gross per 24 hour  Intake 1080 ml  Output --  Net 1080 ml    Filed Weights   03/27/24 0215 03/28/24 0414 03/29/24 0500  Weight: 110.5 kg 107.8 kg 108.5 kg    Examination:  Physical Exam Constitutional:      General: He is not in acute distress.    Appearance: He is not  ill-appearing.  Cardiovascular:     Rate and Rhythm: Normal rate and regular rhythm.  Pulmonary:     Effort: Pulmonary effort is normal.     Breath sounds: Normal breath sounds.  Musculoskeletal:     Right lower leg: No edema.     Left lower leg: No edema.  Neurological:     General: No focal deficit present.     Mental Status: He is alert and oriented to person, place, and time.  Psychiatric:        Mood and Affect: Mood normal.        Behavior: Behavior normal.          Scheduled Medications:   allopurinol   100 mg Oral Daily   amiodarone   200 mg Oral BID   apixaban   5 mg Oral BID   atorvastatin   40 mg Oral Daily   empagliflozin   10 mg Oral Daily  levothyroxine   50 mcg Oral Q0600   metoprolol  succinate  25 mg Oral Daily   mexiletine  150 mg Oral Q12H   pantoprazole   40 mg Oral Daily   potassium chloride   60 mEq Oral TID   sodium chloride  flush  3 mL Intravenous Q12H   spironolactone   100 mg Oral Daily   torsemide   60 mg Oral BID    Continuous Infusions:   PRN Medications:  acetaminophen , calcium  carbonate, ondansetron  **OR** ondansetron  (ZOFRAN ) IV  Antimicrobials from admission:  Anti-infectives (From admission, onward)    None           Data Reviewed:  I have personally reviewed the following...  CBC: Recent Labs  Lab 03/23/24 0324 03/24/24 0434 03/25/24 0602 03/26/24 0521 03/27/24 0610  WBC 6.6 5.8 5.2 5.7 6.7  NEUTROABS 5.5 4.6 4.0 4.4 5.3  HGB 14.4 14.6 14.5 14.2 13.6  HCT 46.5 47.3 46.7 46.2 43.8  MCV 77.1* 77.2* 75.8* 77.3* 77.7*  PLT 295 275 269 259 251   Basic Metabolic Panel: Recent Labs  Lab 03/26/24 0521 03/27/24 0610 03/28/24 0453 03/29/24 0609 03/29/24 1333  NA 134* 132* 134* 133* 133*  K 3.6 4.2 3.4* 3.7 3.2*  CL 95* 103 96* 95* 95*  CO2 26 23 28 26 27   GLUCOSE 99 116* 113* 143* 110*  BUN 46* 50* 53* 58* 57*  CREATININE 2.63* 2.32* 2.57* 2.60* 2.48*  CALCIUM  9.1 8.8* 8.8* 9.1 9.2  MG 2.3  --   --   --   --     GFR: Estimated Creatinine Clearance: 47.3 mL/min (A) (by C-G formula based on SCr of 2.48 mg/dL (H)). Liver Function Tests: No results for input(s): "AST", "ALT", "ALKPHOS", "BILITOT", "PROT", "ALBUMIN" in the last 168 hours.  No results for input(s): "LIPASE", "AMYLASE" in the last 168 hours. No results for input(s): "AMMONIA" in the last 168 hours. Coagulation Profile: No results for input(s): "INR", "PROTIME" in the last 168 hours. Cardiac Enzymes: No results for input(s): "CKTOTAL", "CKMB", "CKMBINDEX", "TROPONINI" in the last 168 hours. BNP (last 3 results) No results for input(s): "PROBNP" in the last 8760 hours. HbA1C: No results for input(s): "HGBA1C" in the last 72 hours. CBG: Recent Labs  Lab 03/25/24 0511 03/26/24 0315 03/27/24 0812 03/28/24 0449 03/29/24 0443  GLUCAP 120* 102* 107* 156* 124*   Lipid Profile: No results for input(s): "CHOL", "HDL", "LDLCALC", "TRIG", "CHOLHDL", "LDLDIRECT" in the last 72 hours. Thyroid  Function Tests: No results for input(s): "TSH", "T4TOTAL", "FREET4", "T3FREE", "THYROIDAB" in the last 72 hours. Anemia Panel: No results for input(s): "VITAMINB12", "FOLATE", "FERRITIN", "TIBC", "IRON", "RETICCTPCT" in the last 72 hours. Most Recent Urinalysis On File:     Component Value Date/Time   COLORURINE AMBER (A) 10/05/2023 0230   APPEARANCEUR CLEAR 10/05/2023 0230   LABSPEC 1.008 10/05/2023 0230   PHURINE 7.0 10/05/2023 0230   GLUCOSEU >=500 (A) 10/05/2023 0230   HGBUR NEGATIVE 10/05/2023 0230   BILIRUBINUR NEGATIVE 10/05/2023 0230   KETONESUR NEGATIVE 10/05/2023 0230   PROTEINUR NEGATIVE 10/05/2023 0230   NITRITE NEGATIVE 10/05/2023 0230   LEUKOCYTESUR NEGATIVE 10/05/2023 0230   Sepsis Labs: @LABRCNTIP (procalcitonin:4,lacticidven:4) Microbiology: No results found for this or any previous visit (from the past 240 hours).    Radiology Studies last 3 days: No results found.     Time spent: 35 min     Wake Conlee, DO [facility] 03/29/2024, 4:54 PM    Dictation software may have been used to generate the above note. Typos may  occur and escape review in typed/dictated notes. Please contact Dr Authur Leghorn directly for clarity if needed.  Staff may message me via secure chat in Epic  but this may not receive an immediate response,  please page me for urgent matters!  If 7PM-7AM, please contact night coverage [email or web address]

## 2024-03-29 NOTE — Evaluation (Signed)
 Physical Therapy Evaluation Patient Details Name: Bradley Powell MRN: [id number] DOB: [birth year] Today's Date: 03/29/2024  History of Present Illness  Pt is a 43 y.o. male with medical history significant of recurrent VT, chronic combined CHF, AFL, CKD, issues with non-compliance and hypokalemia presenting with syncope, elevated troponin, and hypokalemia.  Clinical Impression  Pt was pleasant and motivated to participate during the session and put forth good effort throughout. Pt reported a history of syncopal events with orthostatic vitals as follows: supine BP 95/57 (HR 64), sitting 95/64 (67), standing at 0 min 96/68 (68), standing at 3 min 99/73 (68), all with no significant symptoms.  Pt was Ind with all functional tasks per below with SpO2 and HR WNL.  Pt reported no pain during the session but reported a history of general neck, shoulder, and upper back pain.  Pt education provided on strategies to address these issues per below with pt demonstrating good carryover of techniques and education.  Will complete PT orders at this time but will reassess pt pending a change in status upon receipt of new PT orders.            If plan is discharge home, recommend the following: Assist for transportation   Can travel by private vehicle        Equipment Recommendations None recommended by PT  Recommendations for Other Services       Functional Status Assessment Patient has not had a recent decline in their functional status     Precautions / Restrictions Precautions Precautions: Fall Restrictions Weight Bearing Restrictions Per Provider Order: No      Mobility  Bed Mobility Overal bed mobility: Independent                  Transfers Overall transfer level: Independent                      Ambulation/Gait Ambulation/Gait assistance: Independent Gait Distance (Feet): 150 Feet Assistive device: None Gait Pattern/deviations: WFL(Within Functional Limits)        General Gait Details: Good cadence and stability including during start/stops and 180 deg turns  Stairs Stairs: Yes Stairs assistance: Modified independent (Device/Increase time) Stair Management: One rail Left, Alternating pattern, Forwards Number of Stairs: 6 General stair comments: Pt able to ascend and then descend 6 steps with on rail with good control and stability throughout  Wheelchair Mobility     Tilt Bed    Modified Rankin (Stroke Patients Only)       Balance Overall balance assessment: No apparent balance deficits (not formally assessed)                                           Pertinent Vitals/Pain Pain Assessment Pain Assessment: No/denies pain    Home Living Family/patient expects to be discharged to:: Private residence Living Arrangements: Parent Available Help at Discharge: Family;Available 24 hours/day Type of Home: House Home Access: Stairs to enter Entrance Stairs-Rails: Right;Left;Can reach both Entrance Stairs-Number of Steps: 4 Alternate Level Stairs-Number of Steps: 20 Home Layout: Two level;Bed/bath upstairs   Additional Comments: Has DME in the home but belongs to his parents    Prior Function Prior Level of Function : Independent/Modified Independent             Mobility Comments: Ind amb community distances without an AD, 2-3 falls in the last 6  months but secondary to syncope only ADLs Comments: Ind with ADLs     Extremity/Trunk Assessment   Upper Extremity Assessment Upper Extremity Assessment: Overall WFL for tasks assessed    Lower Extremity Assessment Lower Extremity Assessment: Overall WFL for tasks assessed       Communication   Communication Communication: No apparent difficulties    Cognition Arousal: Alert Behavior During Therapy: WFL for tasks assessed/performed   PT - Cognitive impairments: No apparent impairments                         Following commands: Intact        Cueing Cueing Techniques: Verbal cues, Visual cues     General Comments      Exercises Other Exercises Other Exercises: Pt education provided on general gentle upper trap, neck, and levator scapulae stretching Other Exercises: Pt education on proper sitting posture and scapular retraction movements/exercise   Assessment/Plan    PT Assessment Patient does not need any further PT services  PT Problem List         PT Treatment Interventions      PT Goals (Current goals can be found in the Care Plan section)  Acute Rehab PT Goals PT Goal Formulation: All assessment and education complete, DC therapy    Frequency       Co-evaluation               AM-PAC PT "6 Clicks" Mobility  Outcome Measure Help needed turning from your back to your side while in a flat bed without using bedrails?: None Help needed moving from lying on your back to sitting on the side of a flat bed without using bedrails?: None Help needed moving to and from a bed to a chair (including a wheelchair)?: None Help needed standing up from a chair using your arms (e.g., wheelchair or bedside chair)?: None Help needed to walk in hospital room?: None Help needed climbing 3-5 steps with a railing? : None 6 Click Score: 24    End of Session Equipment Utilized During Treatment: Gait belt Activity Tolerance: Patient tolerated treatment well Patient left: in bed;with call bell/phone within reach;Other (comment) (Pt left with bed alarm off as found) Nurse Communication: Mobility status PT Visit Diagnosis: Pain;History of falling (Z91.81) Pain - Right/Left:  (bilateral) Pain - part of body: Shoulder (neck, upper back)    Time: 1610-9604 PT Time Calculation (min) (ACUTE ONLY): 28 min   Charges:   PT Evaluation $PT Eval Low Complexity: 1 Low   PT General Charges $$ ACUTE PT VISIT: 1 Visit       D. Scott Caron Tardif PT, DPT 03/29/24, 11:24 AM

## 2024-03-29 NOTE — Progress Notes (Signed)
 Advanced Heart Failure Rounding Note  Cardiologist: None  Chief Complaint:  Subjective:    K relatively stable, will continue current regimen. His uptrending creatinine is mildly concerning, but he clearly retains fluid with decrease in his diuretic regimen.   Will arrange for close clinic follow up this week. Recheck BMP early afternoon, but would plan for discharge today.   Discharge medication reconciliation:  Amiodarone  200mg  BID Apixaban  5mg  BID Atorvastatin  40mg  daily Empagliflozin  10mg  daily Metoprolol  succinate 25mg  daily Mexiletine 150mg  BID Torsemide  60mg  BID Spironolactone  100mg  daily Kcl 60mg  TID Metolazone  5mg  weekly on wednesday   Objective:   Weight Range: 108.5 kg Body mass index is 36.37 kg/m.   Vital Signs:   Temp:  [98 F (36.7 C)-98.5 F (36.9 C)] 98.5 F (36.9 C) (05/16 0837) Pulse Rate:  [64-68] 68 (05/16 0837) Resp:  [17-19] 19 (05/16 0837) BP: (84-99)/(54-73) 99/73 (05/16 0930) SpO2:  [96 %-100 %] 100 % (05/16 0837) Weight:  [108.5 kg] 108.5 kg (05/16 0500) Last BM Date : 03/27/24  Weight change: Filed Weights   03/27/24 0215 03/28/24 0414 03/29/24 0500  Weight: 110.5 kg 107.8 kg 108.5 kg    Intake/Output:   Intake/Output Summary (Last 24 hours) at 03/29/2024 1004 Last data filed at 03/29/2024 0600 Gross per 24 hour  Intake 960 ml  Output --  Net 960 ml       Physical Exam    General: NAD Lungs: CTAB CV: Heart regular, no murmur, mildly elevated JVP, trace LE edema Abdomen: soft, no distension Neurologic: awake/alert, no gross FND.    Telemetry   Sinus in the 60s    Medications:     Scheduled Medications:  allopurinol   100 mg Oral Daily   amiodarone   200 mg Oral BID   apixaban   5 mg Oral BID   atorvastatin   40 mg Oral Daily   empagliflozin   10 mg Oral Daily   levothyroxine   50 mcg Oral Q0600   metoprolol  succinate  25 mg Oral Daily   mexiletine  150 mg Oral Q12H   pantoprazole   40 mg Oral Daily    potassium chloride   60 mEq Oral TID   sodium chloride  flush  3 mL Intravenous Q12H   spironolactone   100 mg Oral Daily   torsemide   60 mg Oral BID    Infusions:   PRN Medications: acetaminophen , calcium  carbonate, ondansetron  **OR** ondansetron  (ZOFRAN ) IV    Patient Profile    Bradley Powell is seen today for evaluation of VT and chronic HFrEF at the request of Dr. Jerelene Monday with [facility].   Assessment/Plan   Syncope VT  - s/p ICD 4/23. - VT storm 1/25 in setting of hypokalemia - Admitted again 02/17/24 with VT treated with shock in the setting of hypokalemia (he was out of spirolactone) - Admitted 05/01 with VT treated with shock (hypokalemia again noted) - Now admitted with syncope and VT s/p ATP and shock in setting of hypokalemia. EP has seen. Device reprogrammed.  - Will need outpatient cardiac PET given concern for sarcoid on prior MRI, to be arranged - Continue mexiletine 150 mg bid   - Continue amiodarone  200 bid.  - Continue low-dose metoprolol  succinate 25 mg daily   Hypokalemia - K now 3.7, mildly improved - PO K supp. Continue with 60mEq TID - Continue spironolactone  100mg  daily - Torsemide  60mg  BID -Nephrology consulted, signed off with follow up - Keep K > 4 and Mag > 2  Chronic HFrEF - Has seen  geneticist, Dr. Drexel Gentles. Work up w/ likely pathogenic gene variant (unable to see full report) - Given recurrent VT and concern for sarcoidosis on prior cMRI, EP recommending cardiac PET as outpatient. Also consider device upgrade to CRT-D d/t AV dissociation. - NYHA II  -Return to home dosing, torsemide  60mg  BID - Cotinue spironolactone  100mg  daily - Continue Jardiance  10 mg daily.  - Continue low dose metoprolol  - No ARNi and ARB (hypotension and N/V in the past). - No Bidil  (hypotension in the past)   CKD Stage IIIb - Baseline SCr 2.0-2.3 - Creatinine mildly elevated in the setting of diuresis   AF/AFL - S/p AFL ablation 5/19 with Dr. Carolynne Citron. -  Continue Amio per above  - Continue Eliquis  5 mg bid.    Obesity  - BMI 36 - Eventually consider GLP-1 RA   Hypothyroidism - likely amio-induced - continue levothyroxine   Medication concerns reviewed with patient and pharmacy team. Barriers identified: hypokalemia.    Length of Stay: 5  [name], MD  03/29/2024, 10:04 AM  Advanced Heart Failure Team Pager [phone] (M-F; 7a - 5p)  Please contact [facility] for night-coverage after hours (5p -7a ) and weekends on [email or web address]

## 2024-03-30 ENCOUNTER — Other Ambulatory Visit: Payer: Self-pay

## 2024-03-30 DIAGNOSIS — Z4502 Encounter for adjustment and management of automatic implantable cardiac defibrillator: Secondary | ICD-10-CM | POA: Diagnosis not present

## 2024-03-30 DIAGNOSIS — I428 Other cardiomyopathies: Secondary | ICD-10-CM | POA: Diagnosis not present

## 2024-03-30 DIAGNOSIS — R55 Syncope and collapse: Secondary | ICD-10-CM | POA: Diagnosis not present

## 2024-03-30 DIAGNOSIS — Z95810 Presence of automatic (implantable) cardiac defibrillator: Secondary | ICD-10-CM | POA: Diagnosis not present

## 2024-03-30 DIAGNOSIS — I480 Paroxysmal atrial fibrillation: Secondary | ICD-10-CM | POA: Diagnosis not present

## 2024-03-30 LAB — BASIC METABOLIC PANEL WITH GFR
Anion gap: 9 (ref 5–15)
BUN: 61 mg/dL — ABNORMAL HIGH (ref 6–20)
CO2: 25 mmol/L (ref 22–32)
Calcium: 8.7 mg/dL — ABNORMAL LOW (ref 8.9–10.3)
Chloride: 95 mmol/L — ABNORMAL LOW (ref 98–111)
Creatinine, Ser: 2.46 mg/dL — ABNORMAL HIGH (ref 0.61–1.24)
GFR, Estimated: 33 mL/min — ABNORMAL LOW (ref >60)
Glucose, Bld: 176 mg/dL — ABNORMAL HIGH (ref 70–99)
Potassium: 3.4 mmol/L — ABNORMAL LOW (ref 3.5–5.1)
Sodium: 129 mmol/L — ABNORMAL LOW (ref 135–145)

## 2024-03-30 LAB — MAGNESIUM: Magnesium: 2 mg/dL (ref 1.7–2.4)

## 2024-03-30 MED ORDER — TORSEMIDE 60 MG PO TABS: 60.0000 mg | ORAL_TABLET | Freq: Two times a day (BID) | ORAL | 0 refills | Status: DC

## 2024-03-30 MED ORDER — ATORVASTATIN CALCIUM 40 MG PO TABS
40.0000 mg | ORAL_TABLET | Freq: Every day | ORAL | 0 refills | Status: DC
  Filled 2024-03-30: qty 30, 30d supply, fill #0

## 2024-03-30 MED ORDER — SPIRONOLACTONE 100 MG PO TABS
100.0000 mg | ORAL_TABLET | Freq: Every day | ORAL | 0 refills | Status: DC
  Filled 2024-03-30: qty 30, 30d supply, fill #0

## 2024-03-30 MED ORDER — AMIODARONE HCL 200 MG PO TABS: 200.0000 mg | ORAL_TABLET | Freq: Two times a day (BID) | ORAL | 0 refills | Status: DC

## 2024-03-30 MED ORDER — SPIRONOLACTONE 100 MG PO TABS: 100.0000 mg | ORAL_TABLET | Freq: Every day | ORAL | 0 refills | Status: DC

## 2024-03-30 MED ORDER — ATORVASTATIN CALCIUM 40 MG PO TABS
40.0000 mg | ORAL_TABLET | Freq: Every day | ORAL | 0 refills | Status: DC
Start: 2024-03-30 — End: 2024-05-20

## 2024-03-30 MED ORDER — AMIODARONE HCL 200 MG PO TABS: 200.0000 mg | ORAL_TABLET | Freq: Two times a day (BID) | ORAL | Status: DC

## 2024-03-30 MED ORDER — POTASSIUM CHLORIDE CRYS ER 20 MEQ PO TBCR: EXTENDED_RELEASE_TABLET | ORAL | 0 refills | Status: DC

## 2024-03-30 MED ORDER — METOPROLOL SUCCINATE ER 25 MG PO TB24: 25.0000 mg | ORAL_TABLET | Freq: Every day | ORAL | 0 refills | Status: DC

## 2024-03-30 MED ORDER — MEXILETINE HCL 150 MG PO CAPS: 150.0000 mg | ORAL_CAPSULE | Freq: Two times a day (BID) | ORAL | 0 refills | Status: DC

## 2024-03-30 MED ORDER — METOLAZONE 5 MG PO TABS
5.0000 mg | ORAL_TABLET | ORAL | 0 refills | Status: DC
  Filled 2024-03-30: qty 10, 70d supply, fill #0

## 2024-03-30 MED ORDER — METOLAZONE 5 MG PO TABS: 5.0000 mg | ORAL_TABLET | ORAL | 0 refills | Status: DC

## 2024-03-30 MED ORDER — APIXABAN 5 MG PO TABS: 5.0000 mg | ORAL_TABLET | Freq: Two times a day (BID) | ORAL | 0 refills | Status: DC

## 2024-03-30 MED ORDER — EMPAGLIFLOZIN 10 MG PO TABS: 10.0000 mg | ORAL_TABLET | Freq: Every day | ORAL | 0 refills | Status: DC

## 2024-03-30 MED ORDER — TORSEMIDE 60 MG PO TABS
60.0000 mg | ORAL_TABLET | Freq: Two times a day (BID) | ORAL | 0 refills | Status: DC
  Filled 2024-03-30: qty 60, fill #0

## 2024-03-30 MED ORDER — METOPROLOL SUCCINATE ER 25 MG PO TB24
25.0000 mg | ORAL_TABLET | Freq: Every day | ORAL | 0 refills | Status: DC
Start: 2024-03-30 — End: 2024-03-30
  Filled 2024-03-30: qty 30, 30d supply, fill #0

## 2024-03-30 MED ORDER — POTASSIUM CHLORIDE CRYS ER 20 MEQ PO TBCR
EXTENDED_RELEASE_TABLET | ORAL | 0 refills | Status: DC
  Filled 2024-03-30: qty 330, fill #0

## 2024-03-30 MED ORDER — APIXABAN 5 MG PO TABS
5.0000 mg | ORAL_TABLET | Freq: Two times a day (BID) | ORAL | 0 refills | Status: DC
  Filled 2024-03-30: qty 60, 30d supply, fill #0

## 2024-03-30 MED ORDER — EMPAGLIFLOZIN 10 MG PO TABS
10.0000 mg | ORAL_TABLET | Freq: Every day | ORAL | 0 refills | Status: DC
  Filled 2024-03-30: qty 30, 30d supply, fill #0

## 2024-03-30 MED ORDER — MEXILETINE HCL 150 MG PO CAPS
150.0000 mg | ORAL_CAPSULE | Freq: Two times a day (BID) | ORAL | 0 refills | Status: DC
  Filled 2024-03-30: qty 60, 30d supply, fill #0

## 2024-03-30 NOTE — Progress Notes (Signed)
 Mobility Specialist - Progress Note   03/30/24 1359  Mobility  Activity Ambulated independently in hallway  Level of Assistance Independent  Assistive Device None  Distance Ambulated (ft) 1000 ft  Activity Response Tolerated well  Mobility Referral Yes  Mobility visit 1 Mobility  Mobility Specialist Start Time (ACUTE ONLY) 1331  Mobility Specialist Stop Time (ACUTE ONLY) 1354  Mobility Specialist Time Calculation (min) (ACUTE ONLY) 23 min   Pt OOB on RA upon arrival. Pt ambulates to/from [facility] indep. Pt returns to room with needs in reach.   Wash Hack  Mobility Specialist  03/30/24 2:01 PM

## 2024-03-30 NOTE — Progress Notes (Signed)
 Pt discharged to home per orders. AVS discharge instructions provided to pt and pt's family, at bedside. All questions answered at this time. All pt belongings taken with pt--pt verified. All PIVs removed. This RN messaged MD per pt's request to send his Metoprolol  prescription to [facility] in [place]. Telemetry disconnected and notified.

## 2024-03-30 NOTE — Discharge Summary (Signed)
 Physician Discharge Summary   Patient: Bradley Powell MRN: [id number]  DOB: [birth year]   Admit:     Date of Admission: 03/21/2024 Admitted from: home   Discharge: Date of discharge: 03/30/24 Disposition: Home Condition at discharge: fair  CODE STATUS: FULL CODE     Discharge Physician: Bradley Sprung, DO [facility]     PCP: Bradley Star, MD  Recommendations for Outpatient Follow-up:  Follow up with PCP Bradley Star, MD in 1-2 weeks Keep appointment with cardiology 04/02/24 Please obtain labs/tests: BMP in 2-3 days    Discharge Instructions     Diet - low sodium heart healthy   Complete by: As directed    Increase activity slowly   Complete by: As directed    No wound care   Complete by: As directed          Discharge Diagnoses: Principal Problem:   AICD discharge Active Problems:   Syncope   Atrial fibrillation (HCC)   NICM (nonischemic cardiomyopathy) (HCC)   ICD (implantable cardioverter-defibrillator) in place   Elevated troponin   CKD (chronic kidney disease), stage III (HCC)   Acute hypokalemia      Hospital course / significant events:   Bradley Powell is a 43 y.o. male with medical history significant of recurrent VT, chronic combined CHF, AFL, CKD, issues with non-compliance and  hypokalemia presenting with syncope and hypokalemia.   Noted history of similar episodes associated with hypokalemia as well as recurrent V. tach. Patient with ICD in place. Patient states he did feel like it possibly discharge.   Noted recent admission May 1 to May 3 on the cardiology EP service with issues concerning for ICD shocking as well as V. Tach and hypokalemia. Symptoms felt to be predominantly associated with hypokalemia. Had repletion with resolution of symptoms.   05/08: to ED w/ syncope and hypokalemia. Pacemaker interrogation with noted shock therapy at 0325 today. Also with antitachycardia pacing. K 3.1. Cr baseline.   05/09: electrophys consult - goal K >4. Nephro consult. Question underlying RTA vs low K d/t high dose diuretics.  05/10: K 4.1 - restarted diuretics and spiro, restart K supplementation 80 in AM and 60 in PM. Since patient is high risk of arrhythmia he cannot be discharged until his potassium level stabilizes with initiation of diuretic therapy. Metolazone  added today. His device was interrogated and reprogrammed by EP 05/11: K down to 3.1  05/12: K down to 2.9  05/13: K up to 3.6, will see if this maintains 05/14: K up to 4.2, per cardiology start K supplement at 60 mEq tid, will see if this maintains  05/15: K down again to 3.4, later in the day 3.7 05/16: K drop again this afternoon 3.2, increase KCl to 80/60/80 dosing  05/17: K 3.4, Na 129, per cardiology he is clear for discharge w/ close outpatient follow up    Consultants:  Cardiology / Heart Failure / EP teams Nephrology   Procedures/Surgeries: none      ASSESSMENT & PLAN:   discharge medications per cardiology  Amiodarone  200mg  BID Apixaban  5mg  BID Atorvastatin  40mg  daily Empagliflozin  10mg  daily Metoprolol  succinate 25mg  daily Mexiletine 150mg  BID Torsemide  60mg  BID Spironolactone  100mg  daily Kcl 60mg  mid-day KCl 80 mg AM and PM Metolazone  5mg  weekly on wednesday  Syncope VT  s/p ICD 4/23. VT storm 1/25 in setting of hypokalemia. Admitted again 02/17/24 with VT treated with shock in the setting of hypokalemia (he was out of spirolactone). Admitted 05/01 with  VT treated with shock (hypokalemia again noted). Now admitted with syncope and VT s/p ATP and shock in setting of hypokalemia.  EP has seen. Device reprogrammed.  outpatient cardiac follow up  amiodarone  200 mg twice daily mexiletine 150 mg twice daily  low-dose metoprolol  succinate 25 mg daily  Underlying causes - treat as below re HFrEF / LMNA CM Potassium management as below   Chronic HFrEF, NYHA II NICM (nonischemic cardiomyopathy)  LMNA  cardiomyopathy  2D echo 11/03/2023 with EF 35 to 40% Has seen geneticist, Bradley Powell. Work up w/ likely pathogenic gene variant (unable to see full report). Cardiac MRI more consistent with LMNA cardiomyopathy versus an infiltrative process Consider PET per cardiology outpatient  consider device upgrade to CRT-D d/t AV dissociation. torsemide  60 mg twice daily. aldactone  100 mg daily Jardiance  10 mg daily amiodarone  200 mg twice daily mexiletine 150 mg twice daily  low-dose metoprolol  succinate 25 mg daily  aggressive potassium supplementation with 80 mg twice daily.  He also takes 60 mg at lunch. Not a candidate for ACE/ARB/Arni given CKD. above. Stable for discharge per cardiology   Persistent hypokalemia despite repletion suspect patient may be having underlying RTA Kcl 80 MEq am, 60 mid-day, 80 pm   Atrial fibrillation, chronic  AF/AFL S/p AFL ablation 5/19 with Bradley Powell. Rate controlled at present  Continue Amio per above  Continue Eliquis  5 mg bid.  low-dose metoprolol  succinate 25 mg daily   CKD (chronic kidney disease), stage IIIa/IIIb Monitor BMP  ICD (implantable cardioverter-defibrillator) in place Status post ICD placement in the setting of AV block Formally interrogated in the ER  Follow w/ cardiology   Hypothyroidism likely amio-induced continue levothyroxine    Class 2 obesity based on BMI: Body mass index is 37.04 kg/m.Bradley Powell Significantly low or high BMI is associated with higher medical risk.  Underweight - under 18  overweight - 25 to 29 obese - 30 or more Class 1 obesity: BMI of 30.0 to 34 Class 2 obesity: BMI of 35.0 to 39 Class 3 obesity: BMI of 40.0 to 49 Super Morbid Obesity: BMI 50-59 Super-super Morbid Obesity: BMI 60+ Healthy nutrition and physical activity advised as adjunct to other disease management and risk reduction treatments             Discharge Instructions  Allergies as of 03/30/2024       Reactions   Entresto   [sacubitril -valsartan ] Nausea And Vomiting, Other (See Comments)   Lightheaded, fainting        Medication List     TAKE these medications    allopurinol  100 MG tablet Commonly known as: ZYLOPRIM  Take 1 tablet (100 mg total) by mouth daily. Please contact PCP for further refills   amiodarone  200 MG tablet Commonly known as: PACERONE  Take 1 tablet (200 mg total) by mouth 2 (two) times daily.   apixaban  5 MG Tabs tablet Commonly known as: Eliquis  TAKE 1 TABLET BY MOUTH TWICE A DAY   atorvastatin  40 MG tablet Commonly known as: LIPITOR Take 1 tablet (40 mg total) by mouth daily.   empagliflozin  10 MG Tabs tablet Commonly known as: Jardiance  Take 1 tablet (10 mg total) by mouth daily.   esomeprazole  40 MG capsule Commonly known as: NEXIUM  Take 1 capsule (40 mg total) by mouth 2 (two) times daily.   levothyroxine  50 MCG tablet Commonly known as: SYNTHROID  TAKE 1 TABLET (50 MCG TOTAL) BY MOUTH DAILY BEFORE BREAKFAST. TAKE 1 HOUR BEFORE BREAKFAST What changed: additional instructions   metolazone  5 MG  tablet Commonly known as: ZAROXOLYN  Take 1 tablet (5 mg total) by mouth once a week. On Wednesdays What changed:  when to take this additional instructions   metoprolol  succinate 25 MG 24 hr tablet Commonly known as: Toprol  XL Take 1 tablet (25 mg total) by mouth daily.   mexiletine 150 MG capsule Commonly known as: MEXITIL  Take 1 capsule (150 mg total) by mouth every 12 (twelve) hours.   polyethylene glycol powder 17 GM/SCOOP powder Commonly known as: GLYCOLAX /MIRALAX  Take 1 CAPFUL (17 g) by mouth daily as needed. What changed: reasons to take this   potassium chloride  SA 20 MEQ tablet Commonly known as: KLOR-CON  M Take 80 mg (4 tablets) po in morning, 60 mg (3 tablets) po mid-day, and 80 mg (4 tablets) po at bedtime What changed:  how much to take how to take this when to take this additional instructions   spironolactone  100 MG tablet Commonly known as:  ALDACTONE  Take 1 tablet (100 mg total) by mouth daily. What changed:  medication strength how much to take   Torsemide  60 MG Tabs Take 60 mg by mouth 2 (two) times daily. What changed:  medication strength how much to take how to take this when to take this additional instructions   Voltaren  1 % Gel Generic drug: diclofenac  Sodium Apply 1 Application topically daily as needed (pain).         Follow-up Information     [facility]. Go on 04/02/2024.   Specialty: Cardiology Why: Hospital Follow-Up  04/02/24 @ 3:45PM Please bring all medications to follow-up appointment [facility] Building, [phone], Second Floor Free Valet Parking at the door Contact information: [address] [place]  [id number] [phone]                Allergies  Allergen Reactions   Entresto  [Sacubitril -Valsartan ] Nausea And Vomiting and Other (See Comments)    Lightheaded, fainting     Subjective: pt has no concerns this afternoon and inquires re: discharge. Denies CP/SOB   Discharge Exam: BP 103/71 (BP Location: Left Arm)   Pulse 71   Temp 98.2 F (36.8 C)   Resp 16   Ht 5\' 8"  (1.727 m)   Wt 108.7 kg   SpO2 100%   BMI 36.43 kg/m  General: Pt is alert, awake, not in acute distress Cardiovascular: RRR, S1/S2 murmur Respiratory: CTA bilaterally, no wheezing, no rhonchi Abdominal: Soft, NT, ND Extremities: no edema, no cyanosis     The results of significant diagnostics from this hospitalization (including imaging, microbiology, ancillary and laboratory) are listed below for reference.     Microbiology: No results found for this or any previous visit (from the past 240 hours).   Labs: BNP (last 3 results) Recent Labs    12/19/23 1511 01/02/24 1431 02/17/24 1938  BNP 256.2* 294.8* 304.9*   Basic Metabolic Panel: Recent Labs  Lab 03/26/24 0521 03/27/24 0610 03/28/24 0453 03/29/24 0609  03/29/24 1333 03/30/24 0953  NA 134* 132* 134* 133* 133* 129*  K 3.6 4.2 3.4* 3.7 3.2* 3.4*  CL 95* 103 96* 95* 95* 95*  CO2 26 23 28 26 27 25   GLUCOSE 99 116* 113* 143* 110* 176*  BUN 46* 50* 53* 58* 57* 61*  CREATININE 2.63* 2.32* 2.57* 2.60* 2.48* 2.46*  CALCIUM  9.1 8.8* 8.8* 9.1 9.2 8.7*  MG 2.3  --   --   --   --  2.0   Liver Function Tests:  No results for input(s): "AST", "ALT", "ALKPHOS", "BILITOT", "PROT", "ALBUMIN" in the last 168 hours. No results for input(s): "LIPASE", "AMYLASE" in the last 168 hours. No results for input(s): "AMMONIA" in the last 168 hours. CBC: Recent Labs  Lab 03/24/24 0434 03/25/24 0602 03/26/24 0521 03/27/24 0610  WBC 5.8 5.2 5.7 6.7  NEUTROABS 4.6 4.0 4.4 5.3  HGB 14.6 14.5 14.2 13.6  HCT 47.3 46.7 46.2 43.8  MCV 77.2* 75.8* 77.3* 77.7*  PLT 275 269 259 251   Cardiac Enzymes: No results for input(s): "CKTOTAL", "CKMB", "CKMBINDEX", "TROPONINI" in the last 168 hours. BNP: Invalid input(s): "POCBNP" CBG: Recent Labs  Lab 03/25/24 0511 03/26/24 0315 03/27/24 0812 03/28/24 0449 03/29/24 0443  GLUCAP 120* 102* 107* 156* 124*   D-Dimer No results for input(s): "DDIMER" in the last 72 hours. Hgb A1c No results for input(s): "HGBA1C" in the last 72 hours. Lipid Profile No results for input(s): "CHOL", "HDL", "LDLCALC", "TRIG", "CHOLHDL", "LDLDIRECT" in the last 72 hours. Thyroid  function studies No results for input(s): "TSH", "T4TOTAL", "T3FREE", "THYROIDAB" in the last 72 hours.  Invalid input(s): "FREET3" Anemia work up No results for input(s): "VITAMINB12", "FOLATE", "FERRITIN", "TIBC", "IRON", "RETICCTPCT" in the last 72 hours. Urinalysis    Component Value Date/Time   COLORURINE AMBER (A) 10/05/2023 0230   APPEARANCEUR CLEAR 10/05/2023 0230   LABSPEC 1.008 10/05/2023 0230   PHURINE 7.0 10/05/2023 0230   GLUCOSEU >=500 (A) 10/05/2023 0230   HGBUR NEGATIVE 10/05/2023 0230   BILIRUBINUR NEGATIVE 10/05/2023 0230    KETONESUR NEGATIVE 10/05/2023 0230   PROTEINUR NEGATIVE 10/05/2023 0230   NITRITE NEGATIVE 10/05/2023 0230   LEUKOCYTESUR NEGATIVE 10/05/2023 0230   Sepsis Labs Recent Labs  Lab 03/24/24 0434 03/25/24 0602 03/26/24 0521 03/27/24 0610  WBC 5.8 5.2 5.7 6.7   Microbiology No results found for this or any previous visit (from the past 240 hours). Imaging CT Head Wo Contrast Result Date: 03/21/2024 CLINICAL DATA:  Facial trauma, blunt; Head trauma, coagulopathy (Age 105-64y); Neck trauma, midline tenderness (Age 52-64y) EXAM: CT HEAD WITHOUT CONTRAST CT MAXILLOFACIAL WITHOUT CONTRAST CT CERVICAL SPINE WITHOUT CONTRAST TECHNIQUE: Multidetector CT imaging of the head, cervical spine, and maxillofacial structures were performed using the standard protocol without intravenous contrast. Multiplanar CT image reconstructions of the cervical spine and maxillofacial structures were also generated. RADIATION DOSE REDUCTION: This exam was performed according to the departmental dose-optimization program which includes automated exposure control, adjustment of the mA and/or kV according to patient size and/or use of iterative reconstruction technique. COMPARISON:  None Available. FINDINGS: CT HEAD FINDINGS Brain: No evidence of acute infarction, hemorrhage, hydrocephalus, extra-axial collection or mass lesion/mass effect. Vascular: No hyperdense vessel. Skull: No acute fracture. Other: No mastoid effusions. CT MAXILLOFACIAL FINDINGS Osseous: No fracture or mandibular dislocation. No destructive process. Orbits: Negative. No traumatic or inflammatory finding. Sinuses: Clear. Soft tissues: Negative. CT CERVICAL SPINE FINDINGS Alignment: Reversal of the normal cervical lordosis. No substantial sagittal subluxation. Skull base and vertebrae: No acute fracture. Vertebral body heights are maintained. Soft tissues and spinal canal: No prevertebral fluid or swelling. No visible canal hematoma. Disc levels: No significant  bony degenerative change. Upper chest: Visualized lung apices are clear. IMPRESSION: 1. No acute intracranial abnormality.  No facial fracture. 2. No evidence of acute fracture or traumatic malalignment in the cervical spine. Electronically Signed   By: Stevenson Elbe M.D.   On: 03/21/2024 03:51   CT Maxillofacial WO CM Result Date: 03/21/2024 CLINICAL DATA:  Facial trauma, blunt; Head trauma, coagulopathy (  Age 85-64y); Neck trauma, midline tenderness (Age 13-64y) EXAM: CT HEAD WITHOUT CONTRAST CT MAXILLOFACIAL WITHOUT CONTRAST CT CERVICAL SPINE WITHOUT CONTRAST TECHNIQUE: Multidetector CT imaging of the head, cervical spine, and maxillofacial structures were performed using the standard protocol without intravenous contrast. Multiplanar CT image reconstructions of the cervical spine and maxillofacial structures were also generated. RADIATION DOSE REDUCTION: This exam was performed according to the departmental dose-optimization program which includes automated exposure control, adjustment of the mA and/or kV according to patient size and/or use of iterative reconstruction technique. COMPARISON:  None Available. FINDINGS: CT HEAD FINDINGS Brain: No evidence of acute infarction, hemorrhage, hydrocephalus, extra-axial collection or mass lesion/mass effect. Vascular: No hyperdense vessel. Skull: No acute fracture. Other: No mastoid effusions. CT MAXILLOFACIAL FINDINGS Osseous: No fracture or mandibular dislocation. No destructive process. Orbits: Negative. No traumatic or inflammatory finding. Sinuses: Clear. Soft tissues: Negative. CT CERVICAL SPINE FINDINGS Alignment: Reversal of the normal cervical lordosis. No substantial sagittal subluxation. Skull base and vertebrae: No acute fracture. Vertebral body heights are maintained. Soft tissues and spinal canal: No prevertebral fluid or swelling. No visible canal hematoma. Disc levels: No significant bony degenerative change. Upper chest: Visualized lung apices are  clear. IMPRESSION: 1. No acute intracranial abnormality.  No facial fracture. 2. No evidence of acute fracture or traumatic malalignment in the cervical spine. Electronically Signed   By: Stevenson Elbe M.D.   On: 03/21/2024 03:51   CT Cervical Spine Wo Contrast Result Date: 03/21/2024 CLINICAL DATA:  Facial trauma, blunt; Head trauma, coagulopathy (Age 28-64y); Neck trauma, midline tenderness (Age 1-64y) EXAM: CT HEAD WITHOUT CONTRAST CT MAXILLOFACIAL WITHOUT CONTRAST CT CERVICAL SPINE WITHOUT CONTRAST TECHNIQUE: Multidetector CT imaging of the head, cervical spine, and maxillofacial structures were performed using the standard protocol without intravenous contrast. Multiplanar CT image reconstructions of the cervical spine and maxillofacial structures were also generated. RADIATION DOSE REDUCTION: This exam was performed according to the departmental dose-optimization program which includes automated exposure control, adjustment of the mA and/or kV according to patient size and/or use of iterative reconstruction technique. COMPARISON:  None Available. FINDINGS: CT HEAD FINDINGS Brain: No evidence of acute infarction, hemorrhage, hydrocephalus, extra-axial collection or mass lesion/mass effect. Vascular: No hyperdense vessel. Skull: No acute fracture. Other: No mastoid effusions. CT MAXILLOFACIAL FINDINGS Osseous: No fracture or mandibular dislocation. No destructive process. Orbits: Negative. No traumatic or inflammatory finding. Sinuses: Clear. Soft tissues: Negative. CT CERVICAL SPINE FINDINGS Alignment: Reversal of the normal cervical lordosis. No substantial sagittal subluxation. Skull base and vertebrae: No acute fracture. Vertebral body heights are maintained. Soft tissues and spinal canal: No prevertebral fluid or swelling. No visible canal hematoma. Disc levels: No significant bony degenerative change. Upper chest: Visualized lung apices are clear. IMPRESSION: 1. No acute intracranial abnormality.   No facial fracture. 2. No evidence of acute fracture or traumatic malalignment in the cervical spine. Electronically Signed   By: Stevenson Elbe M.D.   On: 03/21/2024 03:51   DG Chest Portable 1 View Result Date: 03/21/2024 CLINICAL DATA:  Shortness of breath EXAM: PORTABLE CHEST 1 VIEW COMPARISON:  03/14/2024 FINDINGS: Cardiac shadow is enlarged. Defibrillator is again noted. Lungs are well aerated bilaterally. Mild central vascular congestion is seen without significant edema. No infiltrate is noted. IMPRESSION: Mild vascular congestion without edema. Electronically Signed   By: Violeta Grey M.D.   On: 03/21/2024 03:49      Time coordinating discharge: over 30 minutes  SIGNED:  Yasamin Karel DO [facility]

## 2024-03-30 NOTE — Plan of Care (Signed)
  Problem: Education: Goal: Knowledge of General Education information will improve Description: Including pain rating scale, medication(s)/side effects and non-pharmacologic comfort measures Outcome: Adequate for Discharge   Problem: Health Behavior/Discharge Planning: Goal: Ability to manage health-related needs will improve Outcome: Adequate for Discharge   Problem: Clinical Measurements: Goal: Ability to maintain clinical measurements within normal limits will improve Outcome: Adequate for Discharge Goal: Will remain free from infection Outcome: Adequate for Discharge Goal: Diagnostic test results will improve Outcome: Adequate for Discharge Goal: Respiratory complications will improve Outcome: Adequate for Discharge Goal: Cardiovascular complication will be avoided Outcome: Adequate for Discharge   Problem: Activity: Goal: Risk for activity intolerance will decrease Outcome: Adequate for Discharge   Problem: Nutrition: Goal: Adequate nutrition will be maintained Outcome: Adequate for Discharge   Problem: Coping: Goal: Level of anxiety will decrease Outcome: Adequate for Discharge   Problem: Elimination: Goal: Will not experience complications related to bowel motility Outcome: Adequate for Discharge Goal: Will not experience complications related to urinary retention Outcome: Adequate for Discharge   Problem: Pain Managment: Goal: General experience of comfort will improve and/or be controlled Outcome: Adequate for Discharge   Problem: Safety: Goal: Ability to remain free from injury will improve Outcome: Adequate for Discharge   Problem: Skin Integrity: Goal: Risk for impaired skin integrity will decrease Outcome: Adequate for Discharge   Problem: Education: Goal: Knowledge of condition and prescribed therapy will improve Outcome: Adequate for Discharge   Problem: Cardiac: Goal: Will achieve and/or maintain adequate cardiac output Outcome: Adequate for  Discharge   Problem: Physical Regulation: Goal: Complications related to the disease process, condition or treatment will be avoided or minimized Outcome: Adequate for Discharge

## 2024-03-30 NOTE — Progress Notes (Signed)
 Cardiology Progress Note  Patient ID: Bradley Powell MRN: [id number] DOB: Jan 30, 1981 Date of Encounter: 03/30/2024 Primary Cardiologist: None  Subjective   Chief Complaint: None.   HPI: Denies chest pain or trouble breathing.  Sodium a bit low but does not appear volume overloaded.  We discussed his diagnosis of LMNA cardiomyopathy.   ROS:  All other ROS reviewed and negative. Pertinent positives noted in the HPI.     Telemetry  Overnight telemetry shows SR vs Afib 60s, which I personally reviewed.   ECG  The most recent ECG shows SR vs V paced 73, which I personally reviewed.   Physical Exam   Vitals:   03/30/24 0300 03/30/24 0506 03/30/24 0833 03/30/24 1228  BP:  92/60 103/66 90/63  Pulse:  63 63 67  Resp:  16 16 18   Temp:  98.3 F (36.8 C) 97.6 F (36.4 C) 97.9 F (36.6 C)  TempSrc:  Oral  Oral  SpO2:  100% 100% 100%  Weight: 108.7 kg     Height:        Intake/Output Summary (Last 24 hours) at 03/30/2024 1236 Last data filed at 03/29/2024 1950 Gross per 24 hour  Intake 600 ml  Output --  Net 600 ml       03/30/2024    3:00 AM 03/29/2024    5:00 AM 03/28/2024    4:14 AM  Last 3 Weights  Weight (lbs) 239 lb 9.6 oz 239 lb 3.2 oz 237 lb 9.6 oz  Weight (kg) 108.682 kg 108.5 kg 107.775 kg    Body mass index is 36.43 kg/m.  General: Well nourished, well developed, in no acute distress Head: Atraumatic, normal size  Eyes: PEERLA, EOMI  Neck: Supple, no JVD Endocrine: No thryomegaly Cardiac: Normal S1, S2; RRR; no murmurs, rubs, or gallops Lungs: Clear to auscultation bilaterally, no wheezing, rhonchi or rales  Abd: Soft, nontender, no hepatomegaly  Ext: No edema, pulses 2+ Musculoskeletal: No deformities, BUE and BLE strength normal and equal Skin: Warm and dry, no rashes   Neuro: Alert and oriented to person, place, time, and situation, CNII-XII grossly intact, no focal deficits  Psych: Normal mood and affect   Cardiac Studies  TTE 10/16/2023  1.  Limited study for LV function   2. Left ventricular ejection fraction, by estimation, is 35 to 40%. Left  ventricular ejection fraction by 2D MOD biplane is 37.9 %. The left  ventricle has moderately decreased function. The left ventricle  demonstrates global hypokinesis. There is mild  left ventricular hypertrophy.   3. The mitral valve is abnormal. Mild mitral valve regurgitation.   4. Tricuspid valve regurgitation is moderate.   5. Right ventricular systolic function is mildly reduced. The right  ventricular size is normal. There is normal pulmonary artery systolic  pressure. The estimated right ventricular systolic pressure is 35.2 mmHg.   6. The inferior vena cava is dilated in size with <50% respiratory  variability, suggesting right atrial pressure of 15 mmHg.   7. Rhythm strip during this exam demonstrates atrial flutter.   Patient Profile  Bradley Powell is a 43 y.o. male with LMNA cardiomyopathy, CKD 3a, VT, Afib/AFL, obesity admitted on 03/21/2024 with syncope and ventricular tachycardia status post ICD discharge.  Assessment & Plan   # Ventricular tachycardia # LMNA Cardiomyopathy - Reviewed MRI.  Per advanced heart failure notes he is genetically positive for LMNA cardiomyopathy.  This is an aggressive malignant cardiomyopathy with recurrent arrhythmias. - Seems to have very limited options  for advanced therapies.  Does not appear to be in shock.  I do not believe he has cardiac sarcoidosis. - Continue with amiodarone  200 mg twice daily, mexiletine 150 mg twice daily and low-dose metoprolol  succinate 25 mg daily. - Will need follow-up with EP and heart failure.  We will arrange this.  # Acute on chronic systolic heart failure, EF 35 to 40% # LMNA Cardiomyopathy - Genetically positive for LMNA cardiomyopathy.  Cardiac MRI more consistent with this versus an infiltrative process.  I just do not believe he can have genetically positive aggressive cardiomyopathy and also an  infiltrative process. - He will need close follow-up with the heart failure clinic. - Per my standpoint he is euvolemic.  Can continue his torsemide  60 mg twice daily. - Continue Aldactone  100 mg daily. - Continue aggressive potassium supplementation with 80 mg twice daily.  He also takes 60 mg at lunch. - Not a candidate for ACE/ARB/Arni given CKD. - Continue Jardiance  10 mg daily. - Amiodarone  and mexiletine as above. - Stable for discharge from my standpoint.  # CKD IIIa - Kidney function slightly off baseline.  Okay to continue home diuretics.  Suspect this will improve.  # Persistent atrial fibrillation/flutter - Seems to be staying in rhythm.  Continue Eliquis .  # Hyponatremia - Likely some element of fluid retention.  However does not appear grossly volume overloaded.  Can continue home torsemide  60 mg twice daily as above.   [facility] will sign off.   Medication Recommendations:  As above  Other recommendations (labs, testing, etc):  none Follow up as an outpatient:  1-2 weeks advanced HF clinic  For questions or updates, please contact [facility] Please consult [email or web address] for contact info under        Signed, Melodee Spruce T. Rolm Clos, MD, [facility]   [facility]  03/30/2024 12:36 PM

## 2024-04-01 ENCOUNTER — Telehealth: Payer: Self-pay | Admitting: Cardiology

## 2024-04-01 NOTE — Telephone Encounter (Signed)
 Called to confirm/remind patient of their appointment at the [facility] on 04/02/24.   Appointment:   [x] Confirmed  [] Left mess   [] No answer/No voice mail  [] VM Full/unable to leave message  [] Phone not in service  Patient reminded to bring all medications and/or complete list.  Confirmed patient has transportation. Gave directions, instructed to utilize valet parking.

## 2024-04-02 ENCOUNTER — Encounter: Payer: Self-pay | Admitting: Cardiology

## 2024-04-02 ENCOUNTER — Other Ambulatory Visit: Payer: Self-pay

## 2024-04-02 ENCOUNTER — Ambulatory Visit (HOSPITAL_BASED_OUTPATIENT_CLINIC_OR_DEPARTMENT_OTHER): Admitting: Cardiology

## 2024-04-02 ENCOUNTER — Other Ambulatory Visit
Admission: RE | Admit: 2024-04-02 | Discharge: 2024-04-02 | Disposition: A | Discharge status: Home / Self Care | Source: Ambulatory Visit | Attending: Cardiology | Admitting: Cardiology

## 2024-04-02 VITALS — BP 105/68 | HR 70 | Wt 251.6 lb

## 2024-04-02 DIAGNOSIS — I5022 Chronic systolic (congestive) heart failure: Secondary | ICD-10-CM | POA: Diagnosis not present

## 2024-04-02 DIAGNOSIS — I472 Ventricular tachycardia, unspecified: Secondary | ICD-10-CM | POA: Diagnosis not present

## 2024-04-02 DIAGNOSIS — I5042 Chronic combined systolic (congestive) and diastolic (congestive) heart failure: Secondary | ICD-10-CM

## 2024-04-02 DIAGNOSIS — N183 Chronic kidney disease, stage 3 unspecified: Secondary | ICD-10-CM | POA: Diagnosis not present

## 2024-04-02 LAB — BASIC METABOLIC PANEL WITH GFR
Anion gap: 12 (ref 5–15)
BUN: 57 mg/dL — ABNORMAL HIGH (ref 6–20)
CO2: 18 mmol/L — ABNORMAL LOW (ref 22–32)
Calcium: 9 mg/dL (ref 8.9–10.3)
Chloride: 103 mmol/L (ref 98–111)
Creatinine, Ser: 2.46 mg/dL — ABNORMAL HIGH (ref 0.61–1.24)
GFR, Estimated: 33 mL/min — ABNORMAL LOW (ref >60)
Glucose, Bld: 101 mg/dL — ABNORMAL HIGH (ref 70–99)
Potassium: 5.1 mmol/L (ref 3.5–5.1)
Sodium: 133 mmol/L — ABNORMAL LOW (ref 135–145)

## 2024-04-02 LAB — BRAIN NATRIURETIC PEPTIDE: B Natriuretic Peptide: 800 pg/mL — ABNORMAL HIGH (ref 0.0–100.0)

## 2024-04-02 LAB — MAGNESIUM: Magnesium: 2.2 mg/dL (ref 1.7–2.4)

## 2024-04-02 MED ORDER — METOLAZONE 5 MG PO TABS
5.0000 mg | ORAL_TABLET | ORAL | 0 refills | Status: DC
  Filled 2024-04-02: qty 4, 28d supply, fill #0

## 2024-04-02 NOTE — Patient Instructions (Signed)
 Medication Changes:  FOR THIS EVENING ONLY take 100mg  of Torsemide  WITH an extra 20 of potassium.  Lab Work:  Go over to the [facility]. Go pass the gift shop and have your blood work completed.  We will only call you if the results are abnormal or if the provider would like to make medication changes.   Follow-Up in: Please follow up with the [facility] Failure Clinic in 1 month with Dr. Bruce Caper.   At the [facility], you and your health needs are our priority. We have a designated team specialized in the treatment of Heart Failure. This Care Team includes your primary Heart Failure Specialized Cardiologist (physician), Advanced Practice Providers (APPs- Physician Assistants and Nurse Practitioners), and Pharmacist who all work together to provide you with the care you need, when you need it.   You may see any of the following providers on your designated Care Team at your next follow up:  Dr. Jules Oar Dr. Peder Bourdon Dr. Alwin Baars Dr. Judyth Nunnery Shawnee Dellen, FNP Bevely Brush, RPH-CPP  Please be sure to bring in all your medications bottles to every appointment.   Need to Contact Us :  If you have any questions or concerns before your next appointment please send us  a message through [place] or call our office at [phone].    TO LEAVE A MESSAGE FOR THE NURSE SELECT OPTION 2, PLEASE LEAVE A MESSAGE INCLUDING: YOUR NAME DATE OF BIRTH CALL BACK NUMBER REASON FOR CALL**this is important as we prioritize the call backs  YOU WILL RECEIVE A CALL BACK THE SAME DAY AS LONG AS YOU CALL BEFORE 4:00 PM

## 2024-04-02 NOTE — Progress Notes (Unsigned)
 ADVANCED HF CLINIC NOTE   PCP:  Merl Star, MD  GI : Dr Lucille Saas.  HF Cardiologist: Dr. Julane Ny  Reason for Visit: Heart Failure Follow-up HPI: Bradley Powell is a 43 y.o. male with a history of combined systolic/diastolic heart failure, obesity, HTN, and gout.   Admitted 4/18 with marked volume overload. EF 40-45%. Diuresed with IV lasix  tid + metolazone . Had RHC/LHC as noted below. Overall diuresed 45 pounds. Discharge weight was 281 pounds.    AFL ablation on 5/19. Subsequently had marked 1st AV block with progression to junctional rhythm. Saw Dr. Carolynne Citron who wanted to follow it. Suggested cutting back carvedilol .  Admitted  8/21 with intractable N/V of unclear etiology, hypotension and AKI. Creatinine peaked at 4.5. Back to 1.6 at d/c. Has been followed by GI.   cMRI 7/21 LVEF 45% suspect infiltrative CM. ECV 55%  Volume overloaded at clinic f/u 6/22, weight up 50 lbs. Misunderstood metolazone  directions and took for 5 days. Weight down 65 lbs and required IVF in clinic.  Admitted 4/23 with VT. Had massive volume overload on exam. Had recurrent sustained VT w/ rates 200-210, converted with IV amio and moved to ICU. Echo EF 35-40% RV modHK. Diuresed over 50 pounds with IV lasix . Underwent [employer]i ICD implant. IV amio switched to po (already on mexilitene for PVCs). 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   In 9/23, weight up 20 lbs. ReDs 44%. Prescribed Furoscix  80 mg daily + 40 extra KCL daily x 3 days. Instructed to restart torsemide  40 mg thereafter. Remained volume up at follow up 10/23. Torsemide  increased to 40 bid and added metolazone  2.5 daily x 3 days.  Admitted for 3 weeks 11/24-12/24 diuresed 70 pounds on high-dose lasix   Readmitted 1/25 with VT storm in setting of hypokalemia and recurrent volume overload. Loaded amio. D/c weight 236 pounds.   Seen in clinic earlier last month and was volume up. HL score was elevated at 18L. Wt was up to 151 lb on  our scale. He was instructed to increase metolazone  to 2 x/ wk on Sundays and Mondays.  02/06/24: presents back today for f/u. Wt down 3 lb at 148 lb but he says wt at home is lower, ~140-143 lb. He reports robust UOP on metolazone  days. HL score has improved and back to normal at 2. No further VT/ NSVT episodes on device interrogation.  Reports NYHA Class II symptoms. BP well controlled 110/64.   Admitted 4/5-4/10 for syncope in the setting of hypokalemia (had run out of spirolactone) and VT s/p ICD therapy.   Since his last appointment, Bradley Powell was once more admitted in 03/30/24 with hypokalemia leading to syncope and device shock. His electrolytes were repleted and he was diuresed over the next several days; discharged home on 03/30/24. Reports that his weight is in the 240s at home and up to 251 today which he feels is secondary to a different scale. His weight at the hospital on 03/30/24 was 109kg and now up to 114kg.   Cardiac Studies - Echo (4/23): EF 35-40%, moderate LV dysfunction, RV moderately reduced, mild to moderate MR, moderate TR, posterior pericardial effusion  - cMRI (7/21): 1. Findings suggestive of infiltrative cardiomyopathy. Native T1 ECV 55%, with diffuse post contrast delayed myocardial enhancement and abnormal T1 myocardial nulling kinetics, consistent with a diagnosis of cardiac amyloidosis. 2.  Upper limit of normal biventricular chamber size. 3. Mildly reduced biventricular systolic function. LVEF 45%, RVEF 44%. 4. Cannot exclude possible anomalous  pulmonary venous drainage in to the mid SVC. Seen on axial HASTE image series 3 image 4, but evaluation limited due to poor spatial resolution. Consider ECG gated CTA chest for further evaluation.  - Echo (2/21): EF 40-45%  - Echo (1/20): EF 35-40% - Echo (3/19): EF 45-50%. - Echo (9/18): EF ~25-30%.  - Holter (7/18): 6% PVCs - Sleep study (10/18): AHI 26/hr  - RHC (2/21): RA = 14 RV = 43/15 PA = 43/21  (31) PCW = 15 Fick cardiac output/index = 5.7/2.4 PVR = 2.8 WU Ao sat = 97% PA sat = 73%, 75% SVC sat = 67%   - R/LHC (4/18): AO = 109/86 (97) LV =  101/28 RA =  29 RV = 50/22 PA = 55/24 (38) PCW = 26 Fick cardiac output/index = 6.4/2.6 Thermal CO/CI = 4.9/2.0 PVR = 2.5 WU FA sat = 98% PA sat = 69%, 71% SVC sat = 82%, 82% IVC sat = 66%   1. Normal coronary arteries  2. Mild to moderate pulmonary HTN with normal PVR 3. Biventricular volume overload with R > L heart failure 4. Moderately decreased CO by thermodilution  5. 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 No intra-cardiac shunt  Past Medical History:  Diagnosis Date   Atrial flutter (HCC)    a. s/p ablation 03/2018.   Chronic combined systolic and diastolic CHF (congestive heart failure) (HCC)    History of esophagogastroduodenoscopy (EGD)    Morbid obesity (HCC)    NICM (nonischemic cardiomyopathy) (HCC)    OSA on CPAP    mild OSA with an AHI of 7.7/hr on auto CPAP   PVC's (premature ventricular contractions)    Past Surgical History:  Procedure Laterality Date   A-FLUTTER ABLATION N/A 04/06/2018   Procedure: A-FLUTTER ABLATION;  Surgeon: Tammie Fall, MD;  Location: [facility] INVASIVE CV LAB;  Service: Cardiovascular;  Laterality: N/A;   BIOPSY  07/11/2023   Procedure: BIOPSY;  Surgeon: Felecia Hopper, MD;  Location: [facility] ENDOSCOPY;  Service: Gastroenterology;;   ESOPHAGEAL BRUSHING  06/19/2020   Procedure: ESOPHAGEAL BRUSHING;  Surgeon: Baldo Bonds, MD;  Location: [facility] ENDOSCOPY;  Service: Endoscopy;;   ESOPHAGOGASTRODUODENOSCOPY (EGD) WITH PROPOFOL  Left 06/19/2020   Procedure: ESOPHAGOGASTRODUODENOSCOPY (EGD) WITH PROPOFOL ;  Surgeon: Baldo Bonds, MD;  Location: [facility] ENDOSCOPY;  Service: Endoscopy;  Laterality: Left;   ESOPHAGOGASTRODUODENOSCOPY (EGD) WITH PROPOFOL  N/A 07/11/2023   Procedure: ESOPHAGOGASTRODUODENOSCOPY (EGD)  WITH PROPOFOL ;  Surgeon: Felecia Hopper, MD;  Location: [facility] ENDOSCOPY;  Service: Gastroenterology;  Laterality: N/A;   ICD IMPLANT N/A 02/22/2022   Procedure: ICD IMPLANT;  Surgeon: Tammie Fall, MD;  Location: [facility] INVASIVE CV LAB;  Service: Cardiovascular;  Laterality: N/A;   RIGHT HEART CATH N/A 12/20/2019   Procedure: RIGHT HEART CATH;  Surgeon: Mardell Shade, MD;  Location: [facility] INVASIVE CV LAB;  Service: Cardiovascular;  Laterality: N/A;   RIGHT/LEFT HEART CATH AND CORONARY ANGIOGRAPHY N/A 03/01/2017   Procedure: Right/Left Heart Cath and Coronary Angiography;  Surgeon: Mardell Shade, MD;  Location[facility] INVASIVE CV LAB;  Service: Cardiovascular;  Laterality: N/A;   Current Outpatient Medications  Medication Sig Dispense Refill   allopurinol  (ZYLOPRIM ) 100 MG tablet Take 1 tablet (100 mg total) by mouth daily. Please contact PCP for further refills     amiodarone  (PACERONE ) 200 MG tablet Take 1 tablet (200 mg total) by mouth 2 (two) times daily. 30 tablet 0   apixaban  (ELIQUIS ) 5  MG TABS tablet TAKE 1 TABLET BY MOUTH TWICE A DAY 60 tablet 0   atorvastatin  (LIPITOR) 40 MG tablet Take 1 tablet (40 mg total) by mouth daily. 30 tablet 0   diclofenac  Sodium (VOLTAREN ) 1 % GEL Apply 1 Application topically daily as needed (pain).     empagliflozin  (JARDIANCE ) 10 MG TABS tablet Take 1 tablet (10 mg total) by mouth daily. 30 tablet 0   esomeprazole  (NEXIUM ) 40 MG capsule Take 1 capsule (40 mg total) by mouth 2 (two) times daily. 180 capsule 3   levothyroxine  (SYNTHROID ) 50 MCG tablet TAKE 1 TABLET (50 MCG TOTAL) BY MOUTH DAILY BEFORE BREAKFAST. TAKE 1 HOUR BEFORE BREAKFAST (Patient taking differently: Take 50 mcg by mouth daily before breakfast.) 90 tablet 3   metolazone  (ZAROXOLYN ) 5 MG tablet Take 1 tablet (5 mg total) by mouth once a week. On Wednesdays 10 tablet 0   metoprolol  succinate (TOPROL  XL) 25 MG 24 hr tablet Take 1 tablet (25 mg total) by mouth daily. 30 tablet 0   mexiletine  (MEXITIL ) 150 MG capsule Take 1 capsule (150 mg total) by mouth every 12 (twelve) hours. 60 capsule 0   polyethylene glycol powder (GLYCOLAX /MIRALAX ) 17 GM/SCOOP powder Take 1 CAPFUL (17 g) by mouth daily as needed. (Patient taking differently: Take 17 g by mouth daily as needed for mild constipation or moderate constipation.) 238 g 1   potassium chloride  SA (KLOR-CON  M) 20 MEQ tablet Take 80 mg (4 tablets) po in morning, 60 mg (3 tablets) po mid-day, and 80 mg (4 tablets) po at bedtime 330 tablet 0   spironolactone  (ALDACTONE ) 100 MG tablet Take 1 tablet (100 mg total) by mouth daily. 30 tablet 0   Torsemide  60 MG TABS Take 60 mg by mouth 2 (two) times daily. 60 tablet 0   No current facility-administered medications for this visit.   Allergies:   Entresto  [sacubitril -valsartan ]   Social History:  The patient  reports that he has never smoked. He has never used smokeless tobacco. He reports current alcohol use. He reports that he does not use drugs.   Family History:  The patient's family history includes CVA in his mother; Gout in his father; Hypertension in his father; Multiple sclerosis in his mother; Seizures in his mother.   ROS:  Please see the history of present illness.   All other systems are personally reviewed and negative.   Wt Readings from Last 3 Encounters:  04/02/24 251 lb 9.6 oz (114.1 kg)  03/30/24 239 lb 9.6 oz (108.7 kg)  03/16/24 241 lb 6.5 oz (109.5 kg)    Wt 251 lb 9.6 oz (114.1 kg)   BMI 38.26 kg/m   PHYSICAL EXAM: Vitals:   04/02/24 1612  BP: 105/68  Pulse: 70  SpO2: 100%   GENERAL: NAD Lungs- CTA CARDIAC:  JVP: 9 cm          Normal rate with regular rhythm. no murmur.  Pulses 2+. 1+ edema.  ABDOMEN: Soft, non-tender, non-distended.  EXTREMITIES: Warm and well perfused.  NEUROLOGIC: No obvious FND   [employer] Device interrogation (personally reviewed): HL score 2. No recent VT since 11/20/23.  Activity level 2.4 hr/day. Personally reviewed    ASSESSMENT AND PLAN:  1. Chronic HFrEF - Echo (1/20):  EF 35-40% with mild RV dysfunction RVSP 40 mmHG - Echo (2/21): EF 40-45% Moderate RV dysfunction  - PYP (4/22). Ratio 1.26 read as equivocal.  SPEP negative - cMRI repeated (11/22): LVEF 43%, RVEF 44%, LGE concerning for  sarcoid.  - cMRI (8/21): EF 45% + infiltrative process suggestive of possible amyloidosis - Echo (4/23): EF 35-40% Moderate RV dysfunction - Echo 12/24 EF 35-40% - Has seen geneticist, Dr. Drexel Gentles. Work up c/w LMNA.  - Continue torsemide  80/60 + KCL 40 mEq bid + metolazone  2.5 every Sunday and Wednesdays w/ extra 40 mEq of KCL on those days. He is mildly hypervolemic on exam today with a 4-5kg weight gain since discharge. 1-2+ pitting edema to the knees with mildly elevated JVP. Will increase torsemide  this PM to 100mg  with x1 extra KCL. Repeat labs today. He will be taking metolazone  tomorrow which augments his diuresis significantly.  - Continue spironolactone  50 mg daily.  - Continue Jardiance  10 mg daily.  - Off b-blocker with bradycardia/heart block - No ARNi and ARB (hypotension and N/V in the past). - No Bidil  (hypotension in the past) - not candidate for Advanced therapies given CKD and size   2. VT - 04/23 VT - s/p ICD 4/23. - VT storm 1/25 in setting of hypokalemia - Admitted again 02/17/24 with VT s/p device shock in the setting of hypokalemia (he as out of spirolactone) - Admitted 03/21/24 with VT due to hypokalemia; will repeat labs today.  - Continue mexiletine 150 mg bid   - Continue amiodarone  200 bid.  - Continue Eliquis  5 mg bid.  - Followed by Dr. Carolynne Citron  3. CKD Stage IIIb - Baseline SCr 2.0-2.3 - repeat BMP today.   4. Chronic AF/AFL - S/p AFL ablation 5/19 with Dr. Carolynne Citron. - Continue Amio per above  - Continue Eliquis  5 mg bid.    5.  OSA - currently untreated, needs new device. He reports he is scheduled for a repeat sleep study   7. Obesity - Body mass index is 38.26 kg/m. -  Eventual GLP1Ri; will hold off for now in the setting of persistent hypokalemia and possible diarrhea from [facility].   8. Hypothyroidism - likely amio-induced - continue levothyroxine  - Euthyroid on last labs.    Shaynna Husby, DO  3:55 PM

## 2024-04-04 ENCOUNTER — Telehealth (HOSPITAL_COMMUNITY): Payer: Self-pay | Admitting: Internal Medicine

## 2024-04-04 ENCOUNTER — Telehealth (HOSPITAL_COMMUNITY): Payer: Self-pay | Admitting: Cardiology

## 2024-04-04 NOTE — Telephone Encounter (Signed)
 Patient left VM to report episode of decreased HR and cramping   Reports while at work pt felt his device begin pacing. 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 Denies firing of device , CP, or SOB  Reports event a single episode.   At the time reports he checked his HR and was in 48-52 range and normal for pt is 60-70  Only new medication is metoprolol   Reports he did not reach out to EP team   Advised will send message to provider however unlikely to get answer before follow up Strongly advised to keep follow up

## 2024-04-04 NOTE — Telephone Encounter (Signed)
 Called to confirm/remind patient of their appointment at the [facility] on 04/04/24.   Appointment:   [x] Confirmed  [] Left mess   [] No answer/No voice mail  [] VM Full/unable to leave message  [] Phone not in service  Patient reminded to bring all medications and/or complete list.  Confirmed patient has transportation. Gave directions, instructed to utilize valet parking.

## 2024-04-05 ENCOUNTER — Other Ambulatory Visit: Payer: Self-pay

## 2024-04-05 ENCOUNTER — Encounter: Payer: Self-pay | Admitting: Internal Medicine

## 2024-04-05 ENCOUNTER — Ambulatory Visit (HOSPITAL_COMMUNITY)
Admission: RE | Admit: 2024-04-05 | Discharge: 2024-04-05 | Disposition: A | Discharge status: Home / Self Care | Source: Ambulatory Visit | Attending: Internal Medicine | Admitting: Internal Medicine

## 2024-04-05 VITALS — BP 122/90 | HR 71 | Wt 247.0 lb

## 2024-04-05 DIAGNOSIS — I4892 Unspecified atrial flutter: Secondary | ICD-10-CM | POA: Diagnosis not present

## 2024-04-05 DIAGNOSIS — Z7901 Long term (current) use of anticoagulants: Secondary | ICD-10-CM | POA: Insufficient documentation

## 2024-04-05 DIAGNOSIS — I5042 Chronic combined systolic (congestive) and diastolic (congestive) heart failure: Secondary | ICD-10-CM

## 2024-04-05 DIAGNOSIS — I2510 Atherosclerotic heart disease of native coronary artery without angina pectoris: Secondary | ICD-10-CM

## 2024-04-05 DIAGNOSIS — I5022 Chronic systolic (congestive) heart failure: Secondary | ICD-10-CM | POA: Insufficient documentation

## 2024-04-05 DIAGNOSIS — Z79899 Other long term (current) drug therapy: Secondary | ICD-10-CM | POA: Diagnosis not present

## 2024-04-05 DIAGNOSIS — I472 Ventricular tachycardia, unspecified: Secondary | ICD-10-CM | POA: Diagnosis not present

## 2024-04-05 DIAGNOSIS — Z6837 Body mass index (BMI) 37.0-37.9, adult: Secondary | ICD-10-CM | POA: Insufficient documentation

## 2024-04-05 DIAGNOSIS — E039 Hypothyroidism, unspecified: Secondary | ICD-10-CM | POA: Insufficient documentation

## 2024-04-05 DIAGNOSIS — I428 Other cardiomyopathies: Secondary | ICD-10-CM | POA: Insufficient documentation

## 2024-04-05 DIAGNOSIS — I110 Hypertensive heart disease with heart failure: Secondary | ICD-10-CM | POA: Diagnosis not present

## 2024-04-05 DIAGNOSIS — G4733 Obstructive sleep apnea (adult) (pediatric): Secondary | ICD-10-CM | POA: Insufficient documentation

## 2024-04-05 DIAGNOSIS — E669 Obesity, unspecified: Secondary | ICD-10-CM | POA: Diagnosis not present

## 2024-04-05 DIAGNOSIS — N183 Chronic kidney disease, stage 3 unspecified: Secondary | ICD-10-CM

## 2024-04-05 DIAGNOSIS — I4891 Unspecified atrial fibrillation: Secondary | ICD-10-CM

## 2024-04-05 DIAGNOSIS — E876 Hypokalemia: Secondary | ICD-10-CM | POA: Insufficient documentation

## 2024-04-05 DIAGNOSIS — Z95810 Presence of automatic (implantable) cardiac defibrillator: Secondary | ICD-10-CM | POA: Insufficient documentation

## 2024-04-05 DIAGNOSIS — Z7984 Long term (current) use of oral hypoglycemic drugs: Secondary | ICD-10-CM | POA: Insufficient documentation

## 2024-04-05 DIAGNOSIS — Z79890 Hormone replacement therapy (postmenopausal): Secondary | ICD-10-CM | POA: Diagnosis not present

## 2024-04-05 LAB — COMPREHENSIVE METABOLIC PANEL WITH GFR
ALT: 60 U/L — ABNORMAL HIGH (ref 0–44)
AST: 51 U/L — ABNORMAL HIGH (ref 15–41)
Albumin: 3.2 g/dL — ABNORMAL LOW (ref 3.5–5.0)
Alkaline Phosphatase: 134 U/L — ABNORMAL HIGH (ref 38–126)
Anion gap: 11 (ref 5–15)
BUN: 55 mg/dL — ABNORMAL HIGH (ref 6–20)
CO2: 27 mmol/L (ref 22–32)
Calcium: 9.4 mg/dL (ref 8.9–10.3)
Chloride: 94 mmol/L — ABNORMAL LOW (ref 98–111)
Creatinine, Ser: 2.64 mg/dL — ABNORMAL HIGH (ref 0.61–1.24)
GFR, Estimated: 30 mL/min — ABNORMAL LOW (ref >60)
Glucose, Bld: 110 mg/dL — ABNORMAL HIGH (ref 70–99)
Potassium: 4.6 mmol/L (ref 3.5–5.1)
Sodium: 132 mmol/L — ABNORMAL LOW (ref 135–145)
Total Bilirubin: 2.8 mg/dL — ABNORMAL HIGH (ref 0.0–1.2)
Total Protein: 6.8 g/dL (ref 6.5–8.1)

## 2024-04-05 LAB — BRAIN NATRIURETIC PEPTIDE: B Natriuretic Peptide: 744.7 pg/mL — ABNORMAL HIGH (ref 0.0–100.0)

## 2024-04-05 NOTE — Progress Notes (Signed)
 ADVANCED HF CLINIC NOTE   PCP:  Merl Star, MD  GI : Dr Lucille Saas.  HF Cardiologist: Dr. Julane Ny  Reason for Visit: Heart Failure Follow-up HPI: Bradley Powell is a 43 y.o. male with a history of combined systolic/diastolic heart failure, obesity, HTN, and gout.   Admitted 4/18 with marked volume overload. EF 40-45%. Diuresed with IV lasix  tid + metolazone . Had RHC/LHC as noted below. Overall diuresed 45 pounds. Discharge weight was 281 pounds.    AFL ablation on 5/19. Subsequently had marked 1st AV block with progression to junctional rhythm. Saw Dr. Carolynne Citron who wanted to follow it. Suggested cutting back carvedilol .  Admitted  8/21 with intractable N/V of unclear etiology, hypotension and AKI. Creatinine peaked at 4.5. Back to 1.6 at d/c. Has been followed by GI.   cMRI 7/21 LVEF 45% suspect infiltrative CM. ECV 55%  Volume overloaded at clinic f/u 6/22, weight up 50 lbs. Misunderstood metolazone  directions and took for 5 days. Weight down 65 lbs and required IVF in clinic.  Admitted 4/23 with VT. Had massive volume overload on exam. Had recurrent sustained VT w/ rates 200-210, converted with IV amio and moved to ICU. Echo EF 35-40% RV modHK. Diuresed over 50 pounds with IV lasix . Underwent BosSci ICD implant. IV amio switched to po (already on mexilitene for PVCs). 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   In 9/23, weight up 20 lbs. ReDs 44%. Prescribed Furoscix  80 mg daily + 40 extra KCL daily x 3 days. Instructed to restart torsemide  40 mg thereafter. Remained volume up at follow up 10/23. Torsemide  increased to 40 bid and added metolazone  2.5 daily x 3 days.  Admitted for 3 weeks 11/24-12/24 diuresed 70 pounds on high-dose lasix   Echo 12/24 EF 35-40% RV mild HK Mod MR  Readmitted 1/25 with VT storm in setting of hypokalemia and recurrent volume overload. Loaded amio. D/c weight 236 pounds.   Admitted 4/5-4/10/25 for syncope in the setting of hypokalemia  (had run out of spirolactone) and VT s/p ICD therapy.   Admitted 03/30/24 with hypokalemia leading to syncope and device shock. His weight at the hospital on 03/30/24 was 109kg and was up to 114kg when he saw Dr. Bruce Caper 3 days ago   Here for f/u. Says his HR on his watch was 48 yesterday but no bradycardia on interrogation of his ICD today. No further ICD shocks. Edema better. Says weight at home back down to baseline at 239. Taking torsemide  80/60. Taking potassium 80 tid. Metolazone  now just once a week on wednesday  Cardiac Studies   - cMRI (7/21): 1. Findings suggestive of infiltrative cardiomyopathy. Native T1 ECV 55%, with diffuse post contrast delayed myocardial enhancement and abnormal T1 myocardial nulling kinetics, consistent with a diagnosis of cardiac amyloidosis. 2.  Upper limit of normal biventricular chamber size. 3. Mildly reduced biventricular systolic function. LVEF 45%, RVEF 44%. 4. Cannot exclude possible anomalous pulmonary venous drainage in to the mid SVC. Seen on axial HASTE image series 3 image 4, but evaluation limited due to poor spatial resolution. Consider ECG gated CTA chest for further evaluation.  - - Holter (7/18): 6% PVCs - Sleep study (10/18): AHI 26/hr  - RHC (2/21): RA = 14 RV = 43/15 PA = 43/21 (31) PCW = 15 Fick cardiac output/index = 5.7/2.4 PVR = 2.8 WU Ao sat = 97% PA sat = 73%, 75% SVC sat = 67%   LHC (4/18): normal cors   Past Medical History:  Diagnosis Date  Atrial flutter (HCC)    a. s/p ablation 03/2018.   Chronic combined systolic and diastolic CHF (congestive heart failure) (HCC)    History of esophagogastroduodenoscopy (EGD)    Morbid obesity (HCC)    NICM (nonischemic cardiomyopathy) (HCC)    OSA on CPAP    mild OSA with an AHI of 7.7/hr on auto CPAP   PVC's (premature ventricular contractions)    Past Surgical History:  Procedure Laterality Date   A-FLUTTER ABLATION N/A 04/06/2018   Procedure: A-FLUTTER  ABLATION;  Surgeon: Tammie Fall, MD;  Location: [facility] INVASIVE CV LAB;  Service: Cardiovascular;  Laterality: N/A;   BIOPSY  07/11/2023   Procedure: BIOPSY;  Surgeon: Felecia Hopper, MD;  Location: [facility] ENDOSCOPY;  Service: Gastroenterology;;   ESOPHAGEAL BRUSHING  06/19/2020   Procedure: ESOPHAGEAL BRUSHING;  Surgeon: Baldo Bonds, MD;  Location: [facility] ENDOSCOPY;  Service: Endoscopy;;   ESOPHAGOGASTRODUODENOSCOPY (EGD) WITH PROPOFOL  Left 06/19/2020   Procedure: ESOPHAGOGASTRODUODENOSCOPY (EGD) WITH PROPOFOL ;  Surgeon: Baldo Bonds, MD;  Location: [facility] ENDOSCOPY;  Service: Endoscopy;  Laterality: Left;   ESOPHAGOGASTRODUODENOSCOPY (EGD) WITH PROPOFOL  N/A 07/11/2023   Procedure: ESOPHAGOGASTRODUODENOSCOPY (EGD) WITH PROPOFOL ;  Surgeon: Felecia Hopper, MD;  Location: [facility] ENDOSCOPY;  Service: Gastroenterology;  Laterality: N/A;   ICD IMPLANT N/A 02/22/2022   Procedure: ICD IMPLANT;  Surgeon: Tammie Fall, MD;  Location: [facility] INVASIVE CV LAB;  Service: Cardiovascular;  Laterality: N/A;   RIGHT HEART CATH N/A 12/20/2019   Procedure: RIGHT HEART CATH;  Surgeon: Mardell Shade, MD;  Location: [facility] INVASIVE CV LAB;  Service: Cardiovascular;  Laterality: N/A;   RIGHT/LEFT HEART CATH AND CORONARY ANGIOGRAPHY N/A 03/01/2017   Procedure: Right/Left Heart Cath and Coronary Angiography;  Surgeon: Mardell Shade, MD;  Location: [facility] INVASIVE CV LAB;  Service: Cardiovascular;  Laterality: N/A;   Current Outpatient Medications  Medication Sig Dispense Refill   allopurinol  (ZYLOPRIM ) 100 MG tablet Take 1 tablet (100 mg total) by mouth daily. Please contact PCP for further refills     amiodarone  (PACERONE ) 200 MG tablet Take 1 tablet (200 mg total) by mouth 2 (two) times daily. 30 tablet 0   apixaban  (ELIQUIS ) 5 MG TABS tablet TAKE 1 TABLET BY MOUTH TWICE A DAY 60 tablet 0   atorvastatin  (LIPITOR) 40 MG tablet Take 1 tablet (40 mg total) by mouth daily. 30 tablet 0   diclofenac  Sodium (VOLTAREN ) 1 %  GEL Apply 1 Application topically daily as needed (pain).     empagliflozin  (JARDIANCE ) 10 MG TABS tablet Take 1 tablet (10 mg total) by mouth daily. 30 tablet 0   esomeprazole  (NEXIUM ) 40 MG capsule Take 1 capsule (40 mg total) by mouth 2 (two) times daily. 180 capsule 3   levothyroxine  (SYNTHROID ) 50 MCG tablet TAKE 1 TABLET (50 MCG TOTAL) BY MOUTH DAILY BEFORE BREAKFAST. TAKE 1 HOUR BEFORE BREAKFAST (Patient taking differently: Take 50 mcg by mouth daily before breakfast.) 90 tablet 3   metolazone  (ZAROXOLYN ) 5 MG tablet Take 1 tablet (5 mg total) by mouth once a week. On Wednesdays 15 tablet 0   metoprolol  succinate (TOPROL  XL) 25 MG 24 hr tablet Take 1 tablet (25 mg total) by mouth daily. 30 tablet 0   mexiletine (MEXITIL ) 150 MG capsule Take 1 capsule (150 mg total) by mouth every 12 (twelve) hours. 60 capsule 0   polyethylene glycol powder (GLYCOLAX /MIRALAX ) 17 GM/SCOOP powder Take 1 CAPFUL (17 g) by mouth daily as needed. 238 g 1   potassium chloride  SA (KLOR-CON  M) 20 MEQ tablet  Take 80 mg (4 tablets) po in morning, 60 mg (3 tablets) po mid-day, and 80 mg (4 tablets) po at bedtime 330 tablet 0   spironolactone  (ALDACTONE ) 100 MG tablet Take 1 tablet (100 mg total) by mouth daily. 30 tablet 0   Torsemide  60 MG TABS Take 60 mg by mouth 2 (two) times daily. 60 tablet 0   No current facility-administered medications for this encounter.   Allergies:   Entresto  [sacubitril -valsartan ]   Social History:  The patient  reports that he has never smoked. He has never used smokeless tobacco. He reports current alcohol use. He reports that he does not use drugs.   Family History:  The patient's family history includes CVA in his mother; Gout in his father; Hypertension in his father; Multiple sclerosis in his mother; Seizures in his mother.   ROS:  Please see the history of present illness.   All other systems are personally reviewed and negative.   Wt Readings from Last 3 Encounters:  04/05/24 112  kg (247 lb)  04/02/24 114.1 kg (251 lb 9.6 oz)  03/30/24 108.7 kg (239 lb 9.6 oz)    BP (!) 122/90   Pulse 71   Wt 112 kg (247 lb)   SpO2 99%   BMI 37.56 kg/m   PHYSICAL EXAM: General: Obese male. No resp difficulty HEENT: normal Neck: supple. JVP 7-8 Carotids 2+ bilat; no bruits. No lymphadenopathy or thryomegaly appreciated. Cor: PMI nondisplaced. Regular rate & rhythm. No rubs, gallops or murmurs. Lungs: clear Abdomen: soft, nontender, nondistended. No hepatosplenomegaly. No bruits or masses. Good bowel sounds. Extremities: no cyanosis, clubbing, rash, trace edema Neuro: alert & orientedx3, cranial nerves grossly intact. moves all 4 extremities w/o difficulty. Affect pleasant  ECG: Probable junctional rhythm with competing sinus at 69 Personally reviewed  [employer]i Device interrogation (personally reviewed): HL score 20. No ICD shocks  Activity level 1.7 hr/day. Volume ok. HR dropping into 60s with recent addition of metoprolol . Personally reviewed   ASSESSMENT AND PLAN:  1. 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 Ratio 1.26 read as equivocal.  SPEP negative - cMRI repeated (11/22): LVEF 43%, RVEF 44%, LGE concerning for sarcoid.  - cMRI (8/21): EF 45% + infiltrative process suggestive of possible amyloidosis - Echo (4/23): EF 35-40% Moderate RV dysfunction - Echo 12/24 EF 35-40% - Has seen geneticist, Dr. Drexel Gentles. Work up c/w LMNA.  - Has been hospitalized several times recently with VT in setting of hypokalemia and voluem overload - Volume looks pretty good today on exam and device..Continue torsemide  80/60 + KCL 40 mEq bid + metolazone  2.5 every Wednesday (previously was Sunday and Wednesday but dropped Sunday to limit risk of low K)  - Continue spironolactone  50 mg daily.  - Continue Jardiance  10 mg daily.  - Off b-blocker with  bradycardia/heart block - No ARNi and ARB (hypotension and N/V in the past). - No Bidil  (hypotension in the past) - not candidate for advanced therapies given CKD IV and size - Check labs today then weekly BMET x 4 weeks  2. VT - 04/23 VT - s/p ICD 4/23. - VT storm 1/25 in setting of hypokalemia - Admitted again 02/17/24 with VT s/p device shock in the setting of hypokalemia (he as out of spirolactone) - Admitted 03/21/24 with VT due to hypokalemia. Follow labs coloseyl as described above. - Continue  mexiletine 150 mg bid   - Continue amiodarone  200 bid.  - Sees Dr. Carolynne Citron in EP  3. CKD Stage IIIb - Baseline SCr 2.0-2.3 -check labs today  4. Chronic AF/AFL - S/p AFL ablation 5/19 with Dr. Carolynne Citron. - Continue Amio per above  - Continue Eliquis  5 bid No bleeding  5.  OSA - currently untreated, needs new device. He reports he is scheduled for a repeat sleep study   7. Obesity - Body mass index is 37.56 kg/m. - We discussed GLP1RA -he remains hesistant  8. Hypothyroidism - likely amio-induced - continue levothyroxine  - Recent labs ok   9. Hypokalemia - this has been a recurrent issue. - see management as above - follow closely Jules Oar, MD  3:58 PM

## 2024-04-05 NOTE — Patient Instructions (Signed)
 Great to see you today!!!  Medication Changes:  None, continue current medications  Lab Work:  Labs done today, your results will be available in MyChart, we will contact you for abnormal readings.  Your physician recommends that you return for lab work in: weekly for 4 weeks  Special Instructions // Education:  Do the following things EVERYDAY: Weigh yourself in the morning before breakfast. Write it down and keep it in a log. Take your medicines as prescribed Eat low salt foods--Limit salt (sodium) to 2000 mg per day.  Stay as active as you can everyday Limit all fluids for the day to less than 2 liters   Follow-Up in: 6 weeks at [facility]   At the [facility], you and your health needs are our priority. We have a designated team specialized in the treatment of Heart Failure. This Care Team includes your primary Heart Failure Specialized Cardiologist (physician), Advanced Practice Providers (APPs- Physician Assistants and Nurse Practitioners), and Pharmacist who all work together to provide you with the care you need, when you need it.   You may see any of the following providers on your designated Care Team at your next follow up:  Dr. Jules Oar Dr. Peder Bourdon Dr. Alwin Baars Dr. Judyth Nunnery Nieves Bars, NP Ruddy Corral, [place] [facility] [place], [place] Dennise Fitz, NP [place] Lee, NP Luster Salters, PharmD   Please be sure to bring in all your medications bottles to every appointment.   Need to Contact Us :  If you have any questions or concerns before your next appointment please send us  a message through [place] or call our office at [phone].    TO LEAVE A MESSAGE FOR THE NURSE SELECT OPTION 2, PLEASE LEAVE A MESSAGE INCLUDING: YOUR NAME DATE OF BIRTH CALL BACK NUMBER REASON FOR CALL**this is important as we prioritize the call backs  YOU WILL RECEIVE A CALL BACK THE SAME DAY AS LONG AS YOU CALL BEFORE 4:00  PM

## 2024-04-05 NOTE — Telephone Encounter (Signed)
 Please instruct to call EP for device interrogation .  Thanks  Circe Chilton NP-C

## 2024-04-05 NOTE — Progress Notes (Signed)
 Remote ICD transmission.

## 2024-04-12 ENCOUNTER — Other Ambulatory Visit (HOSPITAL_COMMUNITY): Payer: Self-pay

## 2024-04-12 ENCOUNTER — Ambulatory Visit (HOSPITAL_COMMUNITY)
Admission: RE | Admit: 2024-04-12 | Discharge: 2024-04-12 | Disposition: A | Discharge status: Home / Self Care | Source: Ambulatory Visit | Attending: Internal Medicine | Admitting: Internal Medicine

## 2024-04-12 ENCOUNTER — Telehealth (HOSPITAL_COMMUNITY): Payer: Self-pay | Admitting: *Deleted

## 2024-04-12 DIAGNOSIS — I5042 Chronic combined systolic (congestive) and diastolic (congestive) heart failure: Secondary | ICD-10-CM | POA: Diagnosis not present

## 2024-04-12 DIAGNOSIS — E876 Hypokalemia: Secondary | ICD-10-CM

## 2024-04-12 LAB — BASIC METABOLIC PANEL WITH GFR
Anion gap: 12 (ref 5–15)
BUN: 43 mg/dL — ABNORMAL HIGH (ref 6–20)
CO2: 27 mmol/L (ref 22–32)
Calcium: 8.8 mg/dL — ABNORMAL LOW (ref 8.9–10.3)
Chloride: 98 mmol/L (ref 98–111)
Creatinine, Ser: 2.21 mg/dL — ABNORMAL HIGH (ref 0.61–1.24)
GFR, Estimated: 38 mL/min — ABNORMAL LOW (ref >60)
Glucose, Bld: 112 mg/dL — ABNORMAL HIGH (ref 70–99)
Potassium: 3.9 mmol/L (ref 3.5–5.1)
Sodium: 137 mmol/L (ref 135–145)

## 2024-04-12 MED ORDER — METOLAZONE 2.5 MG PO TABS
ORAL_TABLET | ORAL | 3 refills | Status: DC
  Filled 2024-04-12: qty 15, fill #0
  Filled 2024-04-21: qty 12, 28d supply, fill #0
  Filled 2024-06-18: qty 12, 28d supply, fill #1
  Filled 2024-07-18: qty 12, 28d supply, fill #2

## 2024-04-12 NOTE — Telephone Encounter (Signed)
 Pt was in for labs today and c/o retaining fluid, advised would call him back after labs resulted.  Labs stable, discussed with Dr Julane Ny, pt restart Met 2.5 mg every Sun, continue 5 mg on Wed. Make sure takes KCL as ordered.  Spoke w/pt he is aware, agreeable, and verbalized understanding, he is sch for weekly labs and will keep those appts

## 2024-04-16 NOTE — Progress Notes (Unsigned)
 Electrophysiology Office Follow up Visit Note:    Date:  04/17/2024   ID:  Bradley Powell, DOB 1980-11-21, MRN [id number]  PCP:  Merl Star, MD  [facility] Cardiologist:  None  [facility] Electrophysiologist:  Manya Sells, MD    Interval History:     Bradley Powell is a 43 y.o. male who presents for a follow up visit.   He has seen Dr Carolynne Citron in the past. The patient has a history of chronic systolic heart failure, obesity, HTN, and gout. The patient last saw the HF team on 04/05/2024. I met him when he was recently hospitalized for an ICD shock in the setting of hypokalemia.   During his most recent hospitalization for VT in the setting of hypokalemia, the patient had an abnormal EKG showing A-V dissociation.  His QRS is 104 ms.  He does not V pace.  Today he is doing okay.  He does report some lightheadedness and dizziness at times.  Most of these episodes occur after he stands up but resolves after a few seconds.  He reports shortness of breath with exertion.  He has been taking his medicines as prescribed.         Past medical, surgical, social and family history were reviewed.  ROS:   Please see the history of present illness.    All other systems reviewed and are negative.  EKGs/Labs/Other Studies Reviewed:    The following studies were reviewed today:  October 16, 2023 echo EF 35-40 Mild MR Moderate TR Mildly reduced RV function  04/17/2024 in clinic device interrogation personally reviewed. Presenting rhythm V sensed Histogram show most heart rate 70 to 80 bpm Lead parameter stable No high-voltage therapies since he was last seen I did add a monitor zone at 150 and lowered his VT 1 zone to 175.       Physical Exam:    VS:  BP (!) 84/60   Pulse 70   Ht 5\' 8"  (1.727 m)   Wt 256 lb (116.1 kg)   SpO2 98%   BMI 38.92 kg/m     Wt Readings from Last 3 Encounters:  04/17/24 256 lb (116.1 kg)  04/05/24 247 lb (112 kg)  04/02/24 251 lb  9.6 oz (114.1 kg)     GEN: no distress.  Obese CARD: RRR, No MRG. CIED pocket well healed RESP: No IWOB. CTAB.      ASSESSMENT:    1. Chronic combined systolic and diastolic congestive heart failure (HCC)   2. VT (ventricular tachycardia) (HCC)   3. Hypokalemia   4. Implantable cardioverter-defibrillator (ICD) in situ    PLAN:    In order of problems listed above:  #Chronic systolic heart failure #ICD in situ Device functioning appropriately.  Continue remote monitoring.  Transition remotes  #A-V dissociation I would like to get a PET CT to evaluate for sarcoidosis given prior abnormal MRI suggestive of sarcoid Given the A-V dissociation seen on EKG, favor an upgraded system for him.  Given the AV block, favor upgrade to CRT-D at the time of device intervention.  I discussed this procedure in detail the patient including the risks and recovery and he wishes to proceed.  Risks, benefits, alternatives to CRT upgrade were discussed in detail with the patient today. The patient understands that the risks include but are not limited to bleeding, infection, pneumothorax, perforation, tamponade, vascular damage, renal failure, MI, stroke, death, and lead dislodgement and wishes to proceed.    #Ventricular tachycardia Continue  amiodarone  and mexiletine  #Hypokalemia Has blood work scheduled for this Friday.  I will plan to see the patient back after his FDG PET to review the results and finalize a treatment plan.  Anticipate scheduling CRT-D upgrade at that time.  Signed, Harvie Liner, MD, [facility], [facility] 04/17/2024 9:28 AM    Electrophysiology  [facility]

## 2024-04-17 ENCOUNTER — Ambulatory Visit: Attending: Cardiology | Admitting: Cardiology

## 2024-04-17 ENCOUNTER — Encounter: Payer: Self-pay | Admitting: Cardiology

## 2024-04-17 VITALS — BP 84/60 | HR 70 | Ht 68.0 in | Wt 256.0 lb

## 2024-04-17 DIAGNOSIS — E876 Hypokalemia: Secondary | ICD-10-CM

## 2024-04-17 DIAGNOSIS — I472 Ventricular tachycardia, unspecified: Secondary | ICD-10-CM

## 2024-04-17 DIAGNOSIS — I5042 Chronic combined systolic (congestive) and diastolic (congestive) heart failure: Secondary | ICD-10-CM | POA: Diagnosis not present

## 2024-04-17 DIAGNOSIS — Z95810 Presence of automatic (implantable) cardiac defibrillator: Secondary | ICD-10-CM

## 2024-04-17 LAB — CUP PACEART INCLINIC DEVICE CHECK
Date Time Interrogation Session: 20250604120234
Implantable Lead Connection Status: 753985
Implantable Lead Implant Date: 20230411
Implantable Lead Location: 753860
Implantable Lead Model: 138
Implantable Lead Serial Number: 305338
Implantable Pulse Generator Implant Date: 20230411
Pulse Gen Serial Number: 216862

## 2024-04-17 NOTE — Patient Instructions (Addendum)
 Medication Instructions:  Your physician recommends that you continue on your current medications as directed. Please refer to the Current Medication list given to you today.  *If you need a refill on your cardiac medications before your next appointment, please call your pharmacy*  Testing/Procedures: PET/CT scan - please see instruction letter. You will be called to schedule this test.   Follow-Up: At [facility], you and your health needs are our priority.  As part of our continuing mission to provide you with exceptional heart care, our providers are all part of one team.  This team includes your primary Cardiologist (physician) and Advanced Practice Providers or APPs (Physician Assistants and Nurse Practitioners) who all work together to provide you with the care you need, when you need it.  Your next appointment:   After PET scan  Provider:   Harvie Liner, MD

## 2024-04-18 ENCOUNTER — Encounter: Payer: Self-pay | Admitting: Cardiology

## 2024-04-18 ENCOUNTER — Encounter (HOSPITAL_COMMUNITY): Payer: Self-pay | Admitting: Internal Medicine

## 2024-04-18 MED ORDER — EMPAGLIFLOZIN 10 MG PO TABS: 10.0000 mg | ORAL_TABLET | Freq: Every day | ORAL | 6 refills | Status: DC

## 2024-04-19 ENCOUNTER — Other Ambulatory Visit (HOSPITAL_COMMUNITY): Payer: Self-pay | Admitting: *Deleted

## 2024-04-19 ENCOUNTER — Other Ambulatory Visit: Payer: Self-pay

## 2024-04-19 ENCOUNTER — Other Ambulatory Visit (HOSPITAL_COMMUNITY): Payer: Self-pay

## 2024-04-19 ENCOUNTER — Ambulatory Visit (HOSPITAL_COMMUNITY)
Admission: RE | Admit: 2024-04-19 | Discharge: 2024-04-19 | Disposition: A | Discharge status: Home / Self Care | Source: Ambulatory Visit | Attending: Cardiology | Admitting: Cardiology

## 2024-04-19 DIAGNOSIS — I5042 Chronic combined systolic (congestive) and diastolic (congestive) heart failure: Secondary | ICD-10-CM | POA: Insufficient documentation

## 2024-04-19 DIAGNOSIS — E876 Hypokalemia: Secondary | ICD-10-CM | POA: Insufficient documentation

## 2024-04-19 LAB — BASIC METABOLIC PANEL WITH GFR
Anion gap: 17 — ABNORMAL HIGH (ref 5–15)
BUN: 50 mg/dL — ABNORMAL HIGH (ref 6–20)
CO2: 27 mmol/L (ref 22–32)
Calcium: 9.4 mg/dL (ref 8.9–10.3)
Chloride: 93 mmol/L — ABNORMAL LOW (ref 98–111)
Creatinine, Ser: 2.12 mg/dL — ABNORMAL HIGH (ref 0.61–1.24)
GFR, Estimated: 40 mL/min — ABNORMAL LOW (ref >60)
Glucose, Bld: 112 mg/dL — ABNORMAL HIGH (ref 70–99)
Potassium: 3.7 mmol/L (ref 3.5–5.1)
Sodium: 137 mmol/L (ref 135–145)

## 2024-04-19 MED ORDER — EMPAGLIFLOZIN 10 MG PO TABS
10.0000 mg | ORAL_TABLET | Freq: Every day | ORAL | 11 refills | Status: DC
  Filled 2024-04-19: qty 30, 30d supply, fill #0
  Filled 2024-05-15: qty 30, 30d supply, fill #1
  Filled 2024-06-23: qty 30, 30d supply, fill #2
  Filled 2024-07-24: qty 30, 30d supply, fill #3

## 2024-04-22 ENCOUNTER — Telehealth: Payer: Self-pay | Admitting: Pharmacy Technician

## 2024-04-22 ENCOUNTER — Other Ambulatory Visit (HOSPITAL_COMMUNITY): Payer: Self-pay

## 2024-04-22 ENCOUNTER — Other Ambulatory Visit: Payer: Self-pay

## 2024-04-22 NOTE — Telephone Encounter (Signed)
 Hey we received prior authorization for jardiance , he just saw cameron lambert which is our doctor so I started to do this prior authorization but as I was filling out the prior authorization noticed Dr. Julane Ny actually wrote this today. So I stopped this prior authorization and sending to you guys. Do you want me to archive this prior authorization or ?

## 2024-04-23 ENCOUNTER — Other Ambulatory Visit (HOSPITAL_COMMUNITY): Payer: Self-pay

## 2024-04-23 ENCOUNTER — Other Ambulatory Visit: Payer: Self-pay

## 2024-04-23 MED ORDER — COLCHICINE 0.6 MG PO TABS
0.6000 mg | ORAL_TABLET | Freq: Every day | ORAL | 4 refills | Status: DC
  Filled 2024-04-23: qty 30, 30d supply, fill #0
  Filled 2024-06-23: qty 30, 30d supply, fill #1

## 2024-04-23 MED ORDER — COLCHICINE 0.6 MG PO TABS
0.6000 mg | ORAL_TABLET | Freq: Every day | ORAL | 3 refills | Status: DC
  Filled 2024-04-23: qty 90, 90d supply, fill #0

## 2024-04-24 ENCOUNTER — Other Ambulatory Visit: Payer: Self-pay

## 2024-04-24 MED ORDER — METOLAZONE 5 MG PO TABS: 5.0000 mg | ORAL_TABLET | Freq: Every day | ORAL | 3 refills | Status: DC

## 2024-04-25 ENCOUNTER — Other Ambulatory Visit: Payer: Self-pay

## 2024-04-25 DIAGNOSIS — I509 Heart failure, unspecified: Secondary | ICD-10-CM | POA: Diagnosis not present

## 2024-04-25 DIAGNOSIS — N1832 Chronic kidney disease, stage 3b: Secondary | ICD-10-CM | POA: Diagnosis not present

## 2024-04-25 DIAGNOSIS — D631 Anemia in chronic kidney disease: Secondary | ICD-10-CM | POA: Diagnosis not present

## 2024-04-25 DIAGNOSIS — N2581 Secondary hyperparathyroidism of renal origin: Secondary | ICD-10-CM | POA: Diagnosis not present

## 2024-04-26 ENCOUNTER — Ambulatory Visit (HOSPITAL_COMMUNITY)
Admission: RE | Admit: 2024-04-26 | Discharge: 2024-04-26 | Disposition: A | Discharge status: Home / Self Care | Source: Ambulatory Visit | Attending: Cardiology | Admitting: Cardiology

## 2024-04-26 ENCOUNTER — Encounter: Payer: Self-pay | Admitting: Internal Medicine

## 2024-04-26 ENCOUNTER — Other Ambulatory Visit (HOSPITAL_COMMUNITY): Payer: Self-pay

## 2024-04-26 DIAGNOSIS — E876 Hypokalemia: Secondary | ICD-10-CM | POA: Insufficient documentation

## 2024-04-26 DIAGNOSIS — I5042 Chronic combined systolic (congestive) and diastolic (congestive) heart failure: Secondary | ICD-10-CM | POA: Diagnosis not present

## 2024-04-26 LAB — BASIC METABOLIC PANEL WITH GFR
Anion gap: 15 (ref 5–15)
BUN: 44 mg/dL — ABNORMAL HIGH (ref 6–20)
CO2: 25 mmol/L (ref 22–32)
Calcium: 8.9 mg/dL (ref 8.9–10.3)
Chloride: 95 mmol/L — ABNORMAL LOW (ref 98–111)
Creatinine, Ser: 2.4 mg/dL — ABNORMAL HIGH (ref 0.61–1.24)
GFR, Estimated: 34 mL/min — ABNORMAL LOW (ref >60)
Glucose, Bld: 131 mg/dL — ABNORMAL HIGH (ref 70–99)
Potassium: 3.9 mmol/L (ref 3.5–5.1)
Sodium: 135 mmol/L (ref 135–145)

## 2024-04-29 ENCOUNTER — Other Ambulatory Visit (HOSPITAL_COMMUNITY): Payer: Self-pay | Admitting: Internal Medicine

## 2024-04-29 ENCOUNTER — Other Ambulatory Visit (HOSPITAL_COMMUNITY): Payer: Self-pay

## 2024-04-29 ENCOUNTER — Other Ambulatory Visit: Payer: Self-pay

## 2024-04-29 DIAGNOSIS — E876 Hypokalemia: Secondary | ICD-10-CM | POA: Diagnosis not present

## 2024-04-29 DIAGNOSIS — E039 Hypothyroidism, unspecified: Secondary | ICD-10-CM | POA: Diagnosis not present

## 2024-04-29 DIAGNOSIS — I5020 Unspecified systolic (congestive) heart failure: Secondary | ICD-10-CM | POA: Diagnosis not present

## 2024-04-30 ENCOUNTER — Other Ambulatory Visit: Payer: Self-pay

## 2024-04-30 ENCOUNTER — Other Ambulatory Visit (HOSPITAL_COMMUNITY): Payer: Self-pay | Admitting: *Deleted

## 2024-04-30 ENCOUNTER — Encounter: Admitting: Cardiology

## 2024-04-30 ENCOUNTER — Encounter (HOSPITAL_COMMUNITY): Payer: Self-pay | Admitting: Internal Medicine

## 2024-04-30 MED ORDER — METOPROLOL SUCCINATE ER 25 MG PO TB24
25.0000 mg | ORAL_TABLET | Freq: Every day | ORAL | 6 refills | Status: DC
  Filled 2024-04-30: qty 30, 30d supply, fill #0
  Filled 2024-05-15 – 2024-05-30 (×2): qty 30, 30d supply, fill #1
  Filled 2024-07-07: qty 30, 30d supply, fill #2

## 2024-05-03 ENCOUNTER — Other Ambulatory Visit (HOSPITAL_COMMUNITY)

## 2024-05-06 ENCOUNTER — Ambulatory Visit (HOSPITAL_COMMUNITY)
Admission: RE | Admit: 2024-05-06 | Discharge: 2024-05-06 | Disposition: A | Discharge status: Home / Self Care | Payer: Self-pay | Source: Ambulatory Visit | Attending: Internal Medicine | Admitting: Internal Medicine

## 2024-05-06 ENCOUNTER — Ambulatory Visit
Admission: RE | Admit: 2024-05-06 | Discharge: 2024-05-06 | Discharge status: Home / Self Care | Source: Ambulatory Visit | Attending: Gastroenterology

## 2024-05-06 ENCOUNTER — Other Ambulatory Visit: Payer: Self-pay

## 2024-05-06 DIAGNOSIS — K7460 Unspecified cirrhosis of liver: Secondary | ICD-10-CM

## 2024-05-06 DIAGNOSIS — E876 Hypokalemia: Secondary | ICD-10-CM | POA: Insufficient documentation

## 2024-05-06 DIAGNOSIS — K8020 Calculus of gallbladder without cholecystitis without obstruction: Secondary | ICD-10-CM | POA: Diagnosis not present

## 2024-05-06 DIAGNOSIS — I5042 Chronic combined systolic (congestive) and diastolic (congestive) heart failure: Secondary | ICD-10-CM | POA: Insufficient documentation

## 2024-05-06 DIAGNOSIS — K824 Cholesterolosis of gallbladder: Secondary | ICD-10-CM | POA: Diagnosis not present

## 2024-05-06 LAB — BASIC METABOLIC PANEL WITH GFR
Anion gap: 10 (ref 5–15)
BUN: 36 mg/dL — ABNORMAL HIGH (ref 6–20)
CO2: 26 mmol/L (ref 22–32)
Calcium: 9.1 mg/dL (ref 8.9–10.3)
Chloride: 98 mmol/L (ref 98–111)
Creatinine, Ser: 2.26 mg/dL — ABNORMAL HIGH (ref 0.61–1.24)
GFR, Estimated: 37 mL/min — ABNORMAL LOW (ref >60)
Glucose, Bld: 103 mg/dL — ABNORMAL HIGH (ref 70–99)
Potassium: 4.4 mmol/L (ref 3.5–5.1)
Sodium: 134 mmol/L — ABNORMAL LOW (ref 135–145)

## 2024-05-13 ENCOUNTER — Telehealth: Payer: Self-pay

## 2024-05-13 DIAGNOSIS — K7460 Unspecified cirrhosis of liver: Secondary | ICD-10-CM

## 2024-05-13 NOTE — Progress Notes (Signed)
 Complex Care Management Note Care Guide Note  05/13/2024 Name: Bradley Powell MRN: [id number] DOB: [birth year]   Complex Care Management Outreach Attempts: An unsuccessful telephone outreach was attempted today to offer the patient information about available complex care management services.  Follow Up Plan:  No further outreach attempts will be made at this time. We have been unable to contact the patient to offer or enroll patient in complex care management services.  Encounter Outcome:  Patient Refused  Leotis Rase [facility], [facility] Guide  Direct Dial: [phone]  Fax [phone]

## 2024-05-14 ENCOUNTER — Telehealth (HOSPITAL_COMMUNITY): Payer: Self-pay | Admitting: *Deleted

## 2024-05-14 NOTE — Telephone Encounter (Signed)
 Reaching out to patient to offer assistance regarding upcoming cardiac imaging study; pt verbalizes understanding of appt date/time, parking situation and where to check in, pre-test NPO status; name and call back number provided for further questions should they arise  Larey Brick RN Navigator Cardiac Imaging [facility] [phone] office [phone] cell  Patient verbalized understanding of diet prep.

## 2024-05-15 ENCOUNTER — Other Ambulatory Visit (HOSPITAL_COMMUNITY): Payer: Self-pay | Admitting: Internal Medicine

## 2024-05-15 ENCOUNTER — Other Ambulatory Visit (HOSPITAL_COMMUNITY): Payer: Self-pay

## 2024-05-15 ENCOUNTER — Other Ambulatory Visit: Payer: Self-pay

## 2024-05-15 ENCOUNTER — Other Ambulatory Visit (HOSPITAL_COMMUNITY): Payer: Self-pay | Admitting: Family Medicine

## 2024-05-15 MED ORDER — APIXABAN 5 MG PO TABS
5.0000 mg | ORAL_TABLET | Freq: Two times a day (BID) | ORAL | 11 refills | Status: DC
  Filled 2024-05-15: qty 60, 30d supply, fill #0
  Filled 2024-05-30 – 2024-06-18 (×2): qty 60, 30d supply, fill #1
  Filled 2024-07-18: qty 60, 30d supply, fill #2

## 2024-05-15 MED FILL — Amiodarone HCl Tab 200 MG: ORAL | 90 days supply | Qty: 90 | Fill #0 | Status: CN

## 2024-05-16 ENCOUNTER — Telehealth: Payer: Self-pay | Admitting: Family

## 2024-05-16 ENCOUNTER — Other Ambulatory Visit: Payer: Self-pay

## 2024-05-16 ENCOUNTER — Telehealth (HOSPITAL_COMMUNITY): Payer: Self-pay

## 2024-05-16 ENCOUNTER — Other Ambulatory Visit (HOSPITAL_COMMUNITY): Payer: Self-pay

## 2024-05-16 ENCOUNTER — Encounter (HOSPITAL_COMMUNITY)
Admission: RE | Admit: 2024-05-16 | Discharge: 2024-05-16 | Disposition: A | Discharge status: Home / Self Care | Source: Ambulatory Visit | Attending: Cardiology | Admitting: Cardiology

## 2024-05-16 DIAGNOSIS — I472 Ventricular tachycardia, unspecified: Secondary | ICD-10-CM | POA: Diagnosis not present

## 2024-05-16 DIAGNOSIS — E876 Hypokalemia: Secondary | ICD-10-CM

## 2024-05-16 DIAGNOSIS — I5042 Chronic combined systolic (congestive) and diastolic (congestive) heart failure: Secondary | ICD-10-CM

## 2024-05-16 DIAGNOSIS — Z95810 Presence of automatic (implantable) cardiac defibrillator: Secondary | ICD-10-CM

## 2024-05-16 LAB — NM PET CT MYOCARDIAL SARCOIDOSIS
Nuc Stress EF: 42 %
Rest Nuclear Isotope Dose: 26 mCi

## 2024-05-16 MED ORDER — RUBIDIUM RB82 GENERATOR (RUBYFILL)
25.9600 | PACK | Freq: Once | INTRAVENOUS | Status: AC
  Administered 2024-05-16: 25.96 via INTRAVENOUS

## 2024-05-16 MED ORDER — FLUDEOXYGLUCOSE F - 18 (FDG) INJECTION
9.0000 | Freq: Once | INTRAVENOUS | Status: AC
  Administered 2024-05-16: 9 via INTRAVENOUS

## 2024-05-16 NOTE — Telephone Encounter (Signed)
 Called to confirm/remind patient of their appointment at the [facility] on 05/20/24.   Appointment:   [x] Confirmed  [] Left mess   [] No answer/No voice mail  [] VM Full/unable to leave message  [] Phone not in service  Patient reminded to bring all medications and/or complete list.  Confirmed patient has transportation. Gave directions, instructed to utilize valet parking.

## 2024-05-16 NOTE — Telephone Encounter (Signed)
 Advanced Heart Failure Patient Advocate Encounter  [employer] coverage was previously terminated, prior auth with [employer] was approved and confirmed $4 copay. [employer] appears to now be active, with [employer] as secondary.  Prior authorization for Jardiance  has been submitted to [facility] and approved. Test billing returns $4 for 30 day supply. This plan limits 30 day supply.  Key: [id number] Effective: 05/15/2024 to 05/15/2025  Rachel DEL, CPhT Rx Patient Advocate Phone: [phone]

## 2024-05-16 NOTE — Progress Notes (Deleted)
 ADVANCED HF CLINIC NOTE   PCP:  Rexanne Ingle, MD  GI : Dr Lucian Ee.  HF Cardiologist: Dr. Cherrie  Chief Complaint:   HPI:  Bradley Powell is a 43 y.o. male with a history of combined systolic/diastolic heart failure, obesity, HTN, and gout.   Admitted 4/18 with marked volume overload. EF 40-45%. Diuresed with IV lasix  tid + metolazone . Had RHC/LHC as noted below. Overall diuresed 45 pounds. Discharge weight was 281 pounds.    AFL ablation on 5/19. Subsequently had marked 1st AV block with progression to junctional rhythm. Saw Dr. Waddell who wanted to follow it. Suggested cutting back carvedilol .  Admitted  8/21 with intractable N/V of unclear etiology, hypotension and AKI. Creatinine peaked at 4.5. Back to 1.6 at d/c. Has been followed by GI.   cMRI 7/21 LVEF 45% suspect infiltrative CM. ECV 55%  Volume overloaded at clinic f/u 6/22, weight up 50 lbs. Misunderstood metolazone  directions and took for 5 days. Weight down 65 lbs and required IVF in clinic.  Admitted 4/23 with VT. Had massive volume overload on exam. Had recurrent sustained VT w/ rates 200-210, converted with IV amio and moved to ICU. Echo EF 35-40% RV modHK. Diuresed over 50 pounds with IV lasix . Underwent Bos[employer]i ICD implant. IV amio switched to po (already on mexilitene for PVCs). 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   In 9/23, weight up 20 lbs. ReDs 44%. Prescribed Furoscix  80 mg daily + 40 extra KCL daily x 3 days. Instructed to restart torsemide  40 mg thereafter. Remained volume up at follow up 10/23. Torsemide  increased to 40 bid and added metolazone  2.5 daily x 3 days.  Admitted for 3 weeks 11/24-12/24 diuresed 70 pounds on high-dose lasix   Echo 12/24 EF 35-40% RV mild HK Mod MR  Readmitted 1/25 with VT storm in setting of hypokalemia and recurrent volume overload. Loaded amio. D/c weight 236 pounds.   Admitted 4/5-4/10/25 for syncope in the setting of hypokalemia (had run out of  spirolactone) and VT s/p ICD therapy.   Admitted 03/30/24 with hypokalemia leading to syncope and device shock. His weight at the hospital on 03/30/24 was 109kg and was up to 114kg when he saw Dr. Gardenia 3 days ago   He presents today for a HF follow-up visit with a chief complaint of    Cardiac Studies  - cMRI (7/21): 1. Findings suggestive of infiltrative cardiomyopathy. Native T1 ECV 55%, with diffuse post contrast delayed myocardial enhancement and abnormal T1 myocardial nulling kinetics, consistent with a diagnosis of cardiac amyloidosis. 2.  Upper limit of normal biventricular chamber size. 3. Mildly reduced biventricular systolic function. LVEF 45%, RVEF 44%. 4. Cannot exclude possible anomalous pulmonary venous drainage in to the mid SVC. Seen on axial HASTE image series 3 image 4, but evaluation limited due to poor spatial resolution. Consider ECG gated CTA chest for further evaluation.   - Holter (7/18): 6% PVCs - Sleep study (10/18): AHI 26/hr  - RHC (2/21): RA = 14 RV = 43/15 PA = 43/21 (31) PCW = 15 Fick cardiac output/index = 5.7/2.4 PVR = 2.8 WU Ao sat = 97% PA sat = 73%, 75% SVC sat = 67%   LHC (4/18): normal cors   Past Medical History:  Diagnosis Date   Atrial flutter (HCC)    a. s/p ablation 03/2018.   Chronic combined systolic and diastolic CHF (congestive heart failure) (HCC)    History of esophagogastroduodenoscopy (EGD)    Morbid obesity (HCC)  NICM (nonischemic cardiomyopathy) (HCC)    OSA on CPAP    mild OSA with an AHI of 7.7/hr on auto CPAP   PVC's (premature ventricular contractions)    Past Surgical History:  Procedure Laterality Date   A-FLUTTER ABLATION N/A 04/06/2018   Procedure: A-FLUTTER ABLATION;  Surgeon: Waddell Danelle ORN, MD;  Location: [facility] IN[facility]ASIVE CV LAB;  Service: Cardiovascular;  Laterality: N/A;   BIOPSY  07/11/2023   Procedure: BIOPSY;  Surgeon: Elicia Claw, MD;  Location: [facility] ENDOSCOPY;  Service:  Gastroenterology;;   ESOPHAGEAL BRUSHING  06/19/2020   Procedure: ESOPHAGEAL BRUSHING;  Surgeon: Dianna Specking, MD;  Location: [facility] ENDOSCOPY;  Service: Endoscopy;;   ESOPHAGOGASTRODUODENOSCOPY (EGD) WITH PROPOFOL  Left 06/19/2020   Procedure: ESOPHAGOGASTRODUODENOSCOPY (EGD) WITH PROPOFOL ;  Surgeon: Dianna Specking, MD;  Location: [facility] ENDOSCOPY;  Service: Endoscopy;  Laterality: Left;   ESOPHAGOGASTRODUODENOSCOPY (EGD) WITH PROPOFOL  N/A 07/11/2023   Procedure: ESOPHAGOGASTRODUODENOSCOPY (EGD) WITH PROPOFOL ;  Surgeon: Elicia Claw, MD;  Location: [facility] ENDOSCOPY;  Service: Gastroenterology;  Laterality: N/A;   ICD IMPLANT N/A 02/22/2022   Procedure: ICD IMPLANT;  Surgeon: Waddell Danelle ORN, MD;  Location: [facility] INVASIVE CV LAB;  Service: Cardiovascular;  Laterality: N/A;   RIGHT HEART CATH N/A 12/20/2019   Procedure: RIGHT HEART CATH;  Surgeon: Cherrie Toribio SAUNDERS, MD;  Location: [facility] INVASIVE CV LAB;  Service: Cardiovascular;  Laterality: N/A;   RIGHT/LEFT HEART CATH AND CORONARY ANGIOGRAPHY N/A 03/01/2017   Procedure: Right/Left Heart Cath and Coronary Angiography;  Surgeon: Toribio SAUNDERS Cherrie, MD;  Location: [facility] INVASIVE CV LAB;  Service: Cardiovascular;  Laterality: N/A;   Current Outpatient Medications  Medication Sig Dispense Refill   allopurinol  (ZYLOPRIM ) 100 MG tablet Take 1 tablet (100 mg total) by mouth daily. Please contact PCP for further refills     amiodarone  (PACERONE ) 200 MG tablet Take 1 tablet (200 mg total) by mouth daily. 90 tablet 3   apixaban  (ELIQUIS ) 5 MG TABS tablet TAKE 1 TABLET BY MOUTH TWICE A DAY 60 tablet 0   apixaban  (ELIQUIS ) 5 MG TABS tablet TAKE 1 TABLET BY MOUTH TWICE A DAY 60 tablet 11   atorvastatin  (LIPITOR) 40 MG tablet Take 1 tablet (40 mg total) by mouth daily. 30 tablet 0   colchicine  0.6 MG tablet Take 1 tablet (0.6 mg total) by mouth daily. 90 tablet 4   colchicine  0.6 MG tablet Take 1 tablet (0.6 mg total) by mouth daily. 90 tablet 3   diclofenac  Sodium  (VOLTAREN ) 1 % GEL Apply 1 Application topically daily as needed (pain).     empagliflozin  (JARDIANCE ) 10 MG TABS tablet Take 1 tablet (10 mg total) by mouth daily. 30 tablet 11   esomeprazole  (NEXIUM ) 40 MG capsule Take 1 capsule (40 mg total) by mouth 2 (two) times daily. 180 capsule 3   levothyroxine  (SYNTHROID ) 50 MCG tablet TAKE 1 TABLET (50 MCG TOTAL) BY MOUTH DAILY BEFORE BREAKFAST. TAKE 1 HOUR BEFORE BREAKFAST (Patient taking differently: Take 50 mcg by mouth daily before breakfast.) 90 tablet 3   metolazone  (ZAROXOLYN ) 2.5 MG tablet Take 1 tablet by mouth every Sunday and 2 tablets every Wednesday 15 tablet 3   metolazone  (ZAROXOLYN ) 5 MG tablet Take 1 tablet (5 mg total) by mouth daily. 12 tablet 3   metoprolol  succinate (TOPROL  XL) 25 MG 24 hr tablet Take 1 tablet (25 mg total) by mouth daily. 30 tablet 6   mexiletine (MEXITIL ) 150 MG capsule Take 1 capsule (150 mg total) by mouth every 12 (twelve) hours. 60 capsule 0  polyethylene glycol powder (GLYCOLAX /MIRALAX ) 17 GM/SCOOP powder Take 1 CAPFUL (17 g) by mouth daily as needed. 238 g 1   potassium chloride  SA (KLOR-CON  M) 20 MEQ tablet Take 80 mg (4 tablets) po in morning, 60 mg (3 tablets) po mid-day, and 80 mg (4 tablets) po at bedtime 330 tablet 0   spironolactone  (ALDACTONE ) 100 MG tablet Take 1 tablet (100 mg total) by mouth daily. 30 tablet 0   torsemide  (DEMADEX ) 20 MG tablet Take by mouth.     Torsemide  60 MG TABS Take 60 mg by mouth 2 (two) times daily. 60 tablet 0   No current facility-administered medications for this visit.   Allergies:   Entresto  [sacubitril -valsartan ]   Social History:  The patient  reports that he has never smoked. He has never used smokeless tobacco. He reports current alcohol use. He reports that he does not use drugs.   Family History:  The patient's family history includes CVA in his mother; Gout in his father; Hypertension in his father; Multiple sclerosis in his mother; Seizures in his mother.    ROS:  Please see the history of present illness.   All other systems are personally reviewed and negative.     PHYSICAL EXAM: General: Obese male. No resp difficulty HEENT: normal Neck: supple. JVP 7-8 Carotids 2+ bilat; no bruits. No lymphadenopathy or thryomegaly appreciated. Cor: PMI nondisplaced. Regular rate & rhythm. No rubs, gallops or murmurs. Lungs: clear Abdomen: soft, nontender, nondistended. No hepatosplenomegaly. No bruits or masses. Good bowel sounds. Extremities: no cyanosis, clubbing, rash, trace edema Neuro: alert & oriented x3, cranial nerves grossly intact. moves all 4 extremities w/o difficulty. Affect pleasant  ECG:  [employer] Device interrogation (personally reviewed): HL score 20. No ICD shocks  Activity level 1.7 hr/day. Volume ok. HR dropping into 60s with recent addition of metoprolol . Personally reviewed   ASSESSMENT AND PLAN:  1. 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 Ratio 1.26 read as equivocal.  SPEP negative - cMRI repeated (11/22): LVEF 43%, RVEF 44%, LGE concerning for sarcoid.  - cMRI (8/21): EF 45% + infiltrative process suggestive of possible amyloidosis - Echo (4/23): EF 35-40% Moderate RV dysfunction - Echo 12/24 EF 35-40% - Has seen geneticist, Dr. Fairy. Work up c/w LMNA.  - Has been hospitalized several times with VT in setting of hypokalemia and voluem overload - Volume looks pretty good today on exam and device..Continue torsemide  80/60 + KCL 40 mEq bid + metolazone  2.5 every Wednesday (previously was Sunday and Wednesday but dropped Sunday to limit risk of low K)  - Continue spironolactone  50 mg daily.  - Continue Jardiance  10 mg daily.  - Off b-blocker with bradycardia/heart block - No ARNi and ARB (hypotension and N/V in the past). - No Bidil  (hypotension in the past) - not candidate for  advanced therapies given CKD IV and size - BMET today  2. VT - 04/23 VT - s/p ICD 4/23. - VT storm 1/25 in setting of hypokalemia - Admitted again 02/17/24 with VT s/p device shock in the setting of hypokalemia (he as out of spirolactone) - Admitted 03/21/24 with VT due to hypokalemia.  - Continue mexiletine 150 mg bid   - Continue amiodarone  200 bid.  - Saw EP Marny) 06/25 - Mg checked today  3. CKD Stage IIIb - Baseline SCr 2.0-2.3 - BMET today  4. Chronic AF/AFL - S/p AFL ablation 5/19 with Dr. Waddell. - Continue Amio  - Continue Eliquis  5 bid No bleeding  5.  OSA - currently untreated, needs new device. He reports he is scheduled for a repeat sleep study   7. Obesity - There is no height or weight on file to calculate BMI. - We discussed GLP1RA -he remains hesistant  8. Hypothyroidism - likely amio-induced - continue levothyroxine  - Recent labs ok   9. Hypokalemia - this has been a recurrent issue. - BMET today   Ellouise DELENA Class, FNP  05/20/24

## 2024-05-20 ENCOUNTER — Encounter: Admitting: Family

## 2024-05-20 ENCOUNTER — Other Ambulatory Visit: Payer: Self-pay

## 2024-05-20 ENCOUNTER — Ambulatory Visit: Payer: Self-pay | Admitting: Family

## 2024-05-20 ENCOUNTER — Other Ambulatory Visit
Admission: RE | Admit: 2024-05-20 | Discharge: 2024-05-20 | Disposition: A | Discharge status: Home / Self Care | Source: Ambulatory Visit | Attending: Family | Admitting: Family

## 2024-05-20 ENCOUNTER — Telehealth: Payer: Self-pay

## 2024-05-20 ENCOUNTER — Encounter: Payer: Self-pay | Admitting: Family

## 2024-05-20 ENCOUNTER — Ambulatory Visit: Admitting: Family

## 2024-05-20 VITALS — BP 94/55 | HR 71 | Wt 251.4 lb

## 2024-05-20 DIAGNOSIS — N1831 Chronic kidney disease, stage 3a: Secondary | ICD-10-CM | POA: Diagnosis not present

## 2024-05-20 DIAGNOSIS — I5022 Chronic systolic (congestive) heart failure: Secondary | ICD-10-CM | POA: Diagnosis not present

## 2024-05-20 DIAGNOSIS — E032 Hypothyroidism due to medicaments and other exogenous substances: Secondary | ICD-10-CM | POA: Diagnosis not present

## 2024-05-20 DIAGNOSIS — I5042 Chronic combined systolic (congestive) and diastolic (congestive) heart failure: Secondary | ICD-10-CM | POA: Diagnosis not present

## 2024-05-20 DIAGNOSIS — I509 Heart failure, unspecified: Secondary | ICD-10-CM | POA: Diagnosis not present

## 2024-05-20 DIAGNOSIS — G4733 Obstructive sleep apnea (adult) (pediatric): Secondary | ICD-10-CM

## 2024-05-20 DIAGNOSIS — I472 Ventricular tachycardia, unspecified: Secondary | ICD-10-CM | POA: Diagnosis not present

## 2024-05-20 DIAGNOSIS — E876 Hypokalemia: Secondary | ICD-10-CM | POA: Diagnosis not present

## 2024-05-20 DIAGNOSIS — I4891 Unspecified atrial fibrillation: Secondary | ICD-10-CM

## 2024-05-20 LAB — BASIC METABOLIC PANEL WITH GFR
Anion gap: 11 (ref 5–15)
BUN: 45 mg/dL — ABNORMAL HIGH (ref 6–20)
CO2: 25 mmol/L (ref 22–32)
Calcium: 9 mg/dL (ref 8.9–10.3)
Chloride: 98 mmol/L (ref 98–111)
Creatinine, Ser: 2.08 mg/dL — ABNORMAL HIGH (ref 0.61–1.24)
GFR, Estimated: 41 mL/min — ABNORMAL LOW (ref >60)
Glucose, Bld: 98 mg/dL (ref 70–99)
Potassium: 4.5 mmol/L (ref 3.5–5.1)
Sodium: 134 mmol/L — ABNORMAL LOW (ref 135–145)

## 2024-05-20 LAB — MAGNESIUM: Magnesium: 2.2 mg/dL (ref 1.7–2.4)

## 2024-05-20 MED ORDER — POTASSIUM CHLORIDE CRYS ER 20 MEQ PO TBCR
EXTENDED_RELEASE_TABLET | ORAL | 5 refills | Status: DC
Start: 2024-05-20 — End: 2024-05-31
  Filled 2024-05-20: qty 330, 30d supply, fill #0

## 2024-05-20 MED ORDER — TORSEMIDE 60 MG PO TABS
60.0000 mg | ORAL_TABLET | Freq: Two times a day (BID) | ORAL | 5 refills | Status: DC
  Filled 2024-05-20: qty 60, 30d supply, fill #0

## 2024-05-20 MED ORDER — ATORVASTATIN CALCIUM 40 MG PO TABS
40.0000 mg | ORAL_TABLET | Freq: Every day | ORAL | 5 refills | Status: DC
  Filled 2024-05-20: qty 30, 30d supply, fill #0

## 2024-05-20 MED ORDER — TORSEMIDE 20 MG PO TABS
ORAL_TABLET | ORAL | 5 refills | Status: DC
  Filled 2024-05-20: qty 210, 30d supply, fill #0
  Filled 2024-06-23: qty 210, 30d supply, fill #1
  Filled 2024-07-24: qty 210, 30d supply, fill #2

## 2024-05-20 MED ORDER — SPIRONOLACTONE 100 MG PO TABS
100.0000 mg | ORAL_TABLET | Freq: Every day | ORAL | 5 refills | Status: DC
Start: 2024-05-20 — End: 2024-08-05
  Filled 2024-05-20: qty 30, 30d supply, fill #0
  Filled 2024-06-18: qty 30, 30d supply, fill #1
  Filled 2024-07-18: qty 30, 30d supply, fill #2

## 2024-05-20 MED ORDER — MEXILETINE HCL 150 MG PO CAPS
150.0000 mg | ORAL_CAPSULE | Freq: Two times a day (BID) | ORAL | 5 refills | Status: DC
Start: 2024-05-20 — End: 2024-06-18
  Filled 2024-05-20: qty 60, 30d supply, fill #0
  Filled 2024-05-30 – 2024-06-04 (×2): qty 60, 30d supply, fill #1

## 2024-05-20 MED ORDER — TORSEMIDE 20 MG PO TABS
20.0000 mg | ORAL_TABLET | Freq: Every day | ORAL | 5 refills | Status: DC
  Filled 2024-05-20: qty 30, 30d supply, fill #0

## 2024-05-20 NOTE — Progress Notes (Signed)
 ADVANCED HF CLINIC NOTE   PCP:  Rexanne Ingle, MD  GI : Dr Lucian Ee.  HF Cardiologist: Dr. Cherrie  Chief Complaint: shortness of breath   HPI:  Bradley Powell is a 43 y.o. male with a history of combined systolic/diastolic heart failure, obesity, HTN, and gout.   Admitted 4/18 with marked volume overload. EF 40-45%. Diuresed with IV lasix  tid + metolazone . Had RHC/LHC as noted below. Overall diuresed 45 pounds. Discharge weight was 281 pounds.    AFL ablation on 5/19. Subsequently had marked 1st AV block with progression to junctional rhythm. Saw Dr. Waddell who wanted to follow it. Suggested cutting back carvedilol .  Admitted  8/21 with intractable N/V of unclear etiology, hypotension and AKI. Creatinine peaked at 4.5. Back to 1.6 at d/c. Has been followed by GI.   cMRI 7/21 LVEF 45% suspect infiltrative CM. ECV 55%  Volume overloaded at clinic f/u 6/22, weight up 50 lbs. Misunderstood metolazone  directions and took for 5 days. Weight down 65 lbs and required IVF in clinic.  Admitted 4/23 with VT. Had massive volume overload on exam. Had recurrent sustained VT w/ rates 200-210, converted with IV amio and moved to ICU. Echo EF 35-40% RV modHK. Diuresed over 50 pounds with IV lasix . Underwent [employer]i ICD implant. IV amio switched to po (already on mexilitene for PVCs). 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   In 9/23, weight up 20 lbs. ReDs 44%. Prescribed Furoscix  80 mg daily + 40 extra KCL daily x 3 days. Instructed to restart torsemide  40 mg thereafter. Remained volume up at follow up 10/23. Torsemide  increased to 40 bid and added metolazone  2.5 daily x 3 days.  Admitted for 3 weeks 11/24-12/24 diuresed 70 pounds on high-dose lasix   Echo 12/24 EF 35-40% RV mild HK Mod MR  Readmitted 1/25 with VT storm in setting of hypokalemia and recurrent volume overload. Loaded amio. D/c weight 236 pounds.   Admitted 4/5-4/10/25 for syncope in the setting of hypokalemia  (had run out of spirolactone) and VT s/p ICD therapy.   Admitted 03/30/24 with hypokalemia leading to syncope and device shock. His weight at the hospital on 03/30/24 was 109kg and was up to 114kg when he saw Dr. Gardenia 3 days ago   He presents today for a HF follow-up visit with a chief complaint of shortness of breath. Has associated fatigue, occasional palpitations, dizziness, pedal edema (stable), neck soreness. Weight 240-241 pounds at home. Denies chest pain, abdominal distention. Asking for refills for some medications. Wearing CPAP nightly.   Cardiac Studies  PET CT 05/16/24:   FDG uptake was not observed. LV perfusion is normal.   Left ventricular function is abnormal. Global function is mildly reduced. EF: 42%. End diastolic cavity size is normal. End systolic cavity size is normal.   Coronary calcium  was absent on the attenuation correction CT images.   FDG uptake findings are not consistent with active myocardial inflammation/sarcoidosis.  - cMRI (7/21): 1. Findings suggestive of infiltrative cardiomyopathy. Native T1 ECV 55%, with diffuse post contrast delayed myocardial enhancement and abnormal T1 myocardial nulling kinetics, consistent with a diagnosis of cardiac amyloidosis. 2.  Upper limit of normal biventricular chamber size. 3. Mildly reduced biventricular systolic function. LVEF 45%, RVEF 44%. 4. Cannot exclude possible anomalous pulmonary venous drainage in to the mid SVC. Seen on axial HASTE image series 3 image 4, but evaluation limited due to poor spatial resolution. Consider ECG gated CTA chest for further evaluation.   - Holter (7/18):  6% PVCs - Sleep study (10/18): AHI 26/hr - sleep study (10/21): 27 hypoapneas when sleeping on right side  - RHC (2/21): RA = 14 RV = 43/15 PA = 43/21 (31) PCW = 15 Fick cardiac output/index = 5.7/2.4 PVR = 2.8 WU Ao sat = 97% PA sat = 73%, 75% SVC sat = 67%   LHC (4/18): normal cors   Past Medical History:   Diagnosis Date   Atrial flutter (HCC)    a. s/p ablation 03/2018.   Chronic combined systolic and diastolic CHF (congestive heart failure) (HCC)    History of esophagogastroduodenoscopy (EGD)    Morbid obesity (HCC)    NICM (nonischemic cardiomyopathy) (HCC)    OSA on CPAP    mild OSA with an AHI of 7.7/hr on auto CPAP   PVC's (premature ventricular contractions)    Past Surgical History:  Procedure Laterality Date   A-FLUTTER ABLATION N/A 04/06/2018   Procedure: A-FLUTTER ABLATION;  Surgeon: Waddell Danelle ORN, MD;  Location: [facility] INVASIVE CV LAB;  Service: Cardiovascular;  Laterality: N/A;   BIOPSY  07/11/2023   Procedure: BIOPSY;  Surgeon: Elicia Claw, MD;  Location: [facility] ENDOSCOPY;  Service: Gastroenterology;;   ESOPHAGEAL BRUSHING  06/19/2020   Procedure: ESOPHAGEAL BRUSHING;  Surgeon: Dianna Specking, MD;  Location: [facility] ENDOSCOPY;  Service: Endoscopy;;   ESOPHAGOGASTRODUODENOSCOPY (EGD) WITH PROPOFOL  Left 06/19/2020   Procedure: ESOPHAGOGASTRODUODENOSCOPY (EGD) WITH PROPOFOL ;  Surgeon: Dianna Specking, MD;  Location: [facility] ENDOSCOPY;  Service: Endoscopy;  Laterality: Left;   ESOPHAGOGASTRODUODENOSCOPY (EGD) WITH PROPOFOL  N/A 07/11/2023   Procedure: ESOPHAGOGASTRODUODENOSCOPY (EGD) WITH PROPOFOL ;  Surgeon: Elicia Claw, MD;  Location: [facility] ENDOSCOPY;  Service: Gastroenterology;  Laterality: N/A;   ICD IMPLANT N/A 02/22/2022   Procedure: ICD IMPLANT;  Surgeon: Waddell Danelle ORN, MD;  Location: [facility] INVASIVE CV LAB;  Service: Cardiovascular;  Laterality: N/A;   RIGHT HEART CATH N/A 12/20/2019   Procedure: RIGHT HEART CATH;  Surgeon: Cherrie Toribio SAUNDERS, MD;  Location: [facility] INVASIVE CV LAB;  Service: Cardiovascular;  Laterality: N/A;   RIGHT/LEFT HEART CATH AND CORONARY ANGIOGRAPHY N/A 03/01/2017   Procedure: Right/Left Heart Cath and Coronary Angiography;  Surgeon: Toribio SAUNDERS Cherrie, MD;  Location: [facility] INVASIVE CV LAB;  Service: Cardiovascular;  Laterality: N/A;   Current Outpatient  Medications  Medication Sig Dispense Refill   allopurinol  (ZYLOPRIM ) 100 MG tablet Take 1 tablet (100 mg total) by mouth daily. Please contact PCP for further refills     amiodarone  (PACERONE ) 200 MG tablet Take 1 tablet (200 mg total) by mouth daily. 90 tablet 3   apixaban  (ELIQUIS ) 5 MG TABS tablet TAKE 1 TABLET BY MOUTH TWICE A DAY 60 tablet 0   apixaban  (ELIQUIS ) 5 MG TABS tablet TAKE 1 TABLET BY MOUTH TWICE A DAY 60 tablet 11   atorvastatin  (LIPITOR) 40 MG tablet Take 1 tablet (40 mg total) by mouth daily. 30 tablet 0   colchicine  0.6 MG tablet Take 1 tablet (0.6 mg total) by mouth daily. 90 tablet 4   colchicine  0.6 MG tablet Take 1 tablet (0.6 mg total) by mouth daily. 90 tablet 3   diclofenac  Sodium (VOLTAREN ) 1 % GEL Apply 1 Application topically daily as needed (pain).     empagliflozin  (JARDIANCE ) 10 MG TABS tablet Take 1 tablet (10 mg total) by mouth daily. 30 tablet 11   esomeprazole  (NEXIUM ) 40 MG capsule Take 1 capsule (40 mg total) by mouth 2 (two) times daily. 180 capsule 3   levothyroxine  (SYNTHROID ) 50 MCG tablet TAKE 1 TABLET (50  MCG TOTAL) BY MOUTH DAILY BEFORE BREAKFAST. TAKE 1 HOUR BEFORE BREAKFAST 90 tablet 3   metolazone  (ZAROXOLYN ) 2.5 MG tablet Take 1 tablet by mouth every Sunday and 2 tablets every Wednesday 15 tablet 3   metoprolol  succinate (TOPROL  XL) 25 MG 24 hr tablet Take 1 tablet (25 mg total) by mouth daily. 30 tablet 6   mexiletine (MEXITIL ) 150 MG capsule Take 1 capsule (150 mg total) by mouth every 12 (twelve) hours. 60 capsule 0   polyethylene glycol powder (GLYCOLAX /MIRALAX ) 17 GM/SCOOP powder Take 1 CAPFUL (17 g) by mouth daily as needed. 238 g 1   potassium chloride  SA (KLOR-CON  M) 20 MEQ tablet Take 80 mg (4 tablets) po in morning, 60 mg (3 tablets) po mid-day, and 80 mg (4 tablets) po at bedtime 330 tablet 0   spironolactone  (ALDACTONE ) 100 MG tablet Take 1 tablet (100 mg total) by mouth daily. 30 tablet 0   Torsemide  60 MG TABS Take 60 mg by mouth 2  (two) times daily. 60 tablet 0   metolazone  (ZAROXOLYN ) 5 MG tablet Take 1 tablet (5 mg total) by mouth daily. 12 tablet 3   torsemide  (DEMADEX ) 20 MG tablet Take by mouth.     No current facility-administered medications for this visit.   Allergies:   Entresto  [sacubitril -valsartan ]   Social History:  The patient  reports that he has never smoked. He has never used smokeless tobacco. He reports current alcohol use. He reports that he does not use drugs.   Family History:  The patient's family history includes CVA in his mother; Gout in his father; Hypertension in his father; Multiple sclerosis in his mother; Seizures in his mother.   ROS:  Please see the history of present illness.   All other systems are personally reviewed and negative.   Vitals:   05/20/24 1207  BP: (!) 94/55  Pulse: 71  SpO2: 100%  Weight: 251 lb 6 oz (114 kg)   Wt Readings from Last 3 Encounters:  05/20/24 251 lb 6 oz (114 kg)  04/17/24 256 lb (116.1 kg)  04/05/24 247 lb (112 kg)   Lab Results  Component Value Date   CREATININE 2.26 (H) 05/06/2024   CREATININE 2.40 (H) 04/26/2024   CREATININE 2.12 (H) 04/19/2024    PHYSICAL EXAM:  General: Well appearing. No resp difficulty HEENT: normal Neck: supple, no JVD Cor: Regular rhythm, rate. No rubs, gallops or murmurs Lungs: clear Abdomen: soft, nontender, nondistended. Extremities: no cyanosis, clubbing, rash, trace pitting pedal edema bilaterally Neuro: alert & oriented X 3. Moves all 4 extremities w/o difficulty. Affect pleasant   ECG: Heart block, low voltage QRS, HR 67 (personally reviewed by myself and Dr. Zenaida)  [employer] Device interrogation (personally reviewed): HL score 13. Last ICD shock 03/21/24.  Activity level 1.7 hr/day. Volume ok.    ASSESSMENT AND PLAN:  1. 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 Ratio 1.26 read as equivocal.  SPEP negative - cMRI repeated (11/22): LVEF 43%, RVEF 44%, LGE concerning for sarcoid.  - cMRI (8/21): EF 45% + infiltrative process suggestive of possible amyloidosis - Echo (4/23): EF 35-40% Moderate RV dysfunction - Echo (12/24) EF 35-40% - PET (07/25) EF 42%, FDG uptake not consistent with active myocardial inflammation/ sarcoidosis - Has seen geneticist, Dr. Fairy. Work up c/w LMNA.  - Has been hospitalized several times  with VT in setting of hypokalemia and volume overload - weigh up 4 pounds since last visit here 2 months ago - NYHA class II - Volume looks pretty good today on exam and device..Continue torsemide  80/60 + KCL AM, 60meq midday, 80meq PM + metolazone  2.5 every Sunday & 5mg  every Wednesday - Continue spironolactone  100 mg daily.  - Continue Jardiance  10 mg daily.  - Continue metoprolol  succinate 25mg  daily - No ARNi and ARB (hypotension and N/V in the past). - No Bidil  (hypotension in the past) - not candidate for advanced therapies given CKD IV and size - BMET today  2. VT - 04/23 VT - s/p ICD 4/23. - VT storm 1/25 in setting of hypokalemia - Admitted 02/17/24 with VT s/p device shock in the setting of hypokalemia (he was out of spirolactone) - Admitted 03/21/24 with VT due to hypokalemia.  - Continue mexiletine 150 mg bid   - Continue amiodarone  200 daily - Saw EP Marny) 06/25 - Mg checked today  3. CKD Stage IIIb - Baseline SCr 2.0-2.3 - BP 94/55 - BMET today  4. Chronic AF/AFL - S/p AFL ablation 5/19 with Dr. Waddell. - EKG today: Heart block, low voltage QRS, HR 67 (personally reviewed by myself and Dr. Zenaida) - Continue Amio 200mg  daily - Continue Eliquis  5 bid No bleeding  5.  OSA - reports wearing CPAP nightly - last split night sleep study was 10/21  6. Obesity - Body mass index is 38.22 kg/m. - GLP1RA has been discussed with some hesitancy  7. Hypothyroidism - likely amio-induced - continue  levothyroxine  - TSH 03/14/24 was 13.732; back in March, it was 24.819  8. Hypokalemia - this has been a recurrent issue. - BMET today   Return in 1 month to see HF MD, sooner if needed.   Ellouise DELENA Class, FNP  05/20/24

## 2024-05-20 NOTE — Telephone Encounter (Signed)
 Torsemide  re-ordered for 20 mg tablets per pharmacy request.

## 2024-05-20 NOTE — Patient Instructions (Signed)
 Medication Changes:  Refills have been sent to [facility] on [facility].   Lab Work:  Labs done today, your results will be available in MyChart, we will contact you for abnormal readings.    Special Instructions // Education:  Do the following things EVERYDAY: Weigh yourself in the morning before breakfast. Write it down and keep it in a log. Take your medicines as prescribed Eat low salt foods--Limit salt (sodium) to 2000 mg per day.  Stay as active as you can everyday Limit all fluids for the day to less than 2 liters   Follow-Up in: 1 month with Dr. Bensimhon at the [facility] in [place].     If you have any questions or concerns before your next appointment please send us  a message through [place] or call our office at [phone], If it is after office hours your call will be answered by our answering service and directed appropriately.     At the [facility], you and your health needs are our priority. We have a designated team specialized in the treatment of Heart Failure. This Care Team includes your primary Heart Failure Specialized Cardiologist (physician), Advanced Practice Providers (APPs- Physician Assistants and Nurse Practitioners), and Pharmacist who all work together to provide you with the care you need, when you need it.   You may see any of the following providers on your designated Care Team at your next follow up:  Dr. Toribio Fuel Dr. Ezra Shuck Dr. Ria Commander Dr. Odis Brownie Greig Mosses, NP Caffie Shed, [place] [address] [place], [place] Beckey Coe, NP [place] Lee, NP Ellouise Class, NP Jaun Bash, PharmD

## 2024-05-21 ENCOUNTER — Other Ambulatory Visit: Payer: Self-pay | Admitting: Family

## 2024-05-21 ENCOUNTER — Other Ambulatory Visit: Payer: Self-pay

## 2024-05-21 DIAGNOSIS — I472 Ventricular tachycardia, unspecified: Secondary | ICD-10-CM

## 2024-05-22 ENCOUNTER — Other Ambulatory Visit: Payer: Self-pay | Admitting: Family

## 2024-05-22 ENCOUNTER — Other Ambulatory Visit
Admission: RE | Admit: 2024-05-22 | Discharge: 2024-05-22 | Disposition: A | Discharge status: Home / Self Care | Attending: Family | Admitting: Family

## 2024-05-22 ENCOUNTER — Ambulatory Visit (INDEPENDENT_AMBULATORY_CARE_PROVIDER_SITE_OTHER): Payer: 59

## 2024-05-22 ENCOUNTER — Ambulatory Visit: Payer: Self-pay | Admitting: Family

## 2024-05-22 DIAGNOSIS — I472 Ventricular tachycardia, unspecified: Secondary | ICD-10-CM

## 2024-05-22 LAB — POTASSIUM: Potassium: 4.4 mmol/L (ref 3.5–5.1)

## 2024-05-23 ENCOUNTER — Other Ambulatory Visit: Payer: Self-pay

## 2024-05-23 LAB — CUP PACEART REMOTE DEVICE CHECK
Battery Remaining Longevity: 150 mo
Battery Remaining Percentage: 100 %
Brady Statistic RV Percent Paced: 0 %
Date Time Interrogation Session: 20250707120400
HighPow Impedance: 67 Ohm
Implantable Lead Connection Status: 753985
Implantable Lead Implant Date: 20230411
Implantable Lead Location: 753860
Implantable Lead Model: 138
Implantable Lead Serial Number: 305338
Implantable Pulse Generator Implant Date: 20230411
Lead Channel Impedance Value: 424 Ohm
Lead Channel Setting Pacing Amplitude: 3 V
Lead Channel Setting Pacing Pulse Width: 0.8 ms
Lead Channel Setting Sensing Sensitivity: 0.6 mV
Pulse Gen Serial Number: 216862
Zone Setting Status: 755011

## 2024-05-23 MED FILL — Amiodarone HCl Tab 200 MG: ORAL | 90 days supply | Qty: 90 | Fill #0 | Status: AC

## 2024-05-24 ENCOUNTER — Other Ambulatory Visit: Payer: Self-pay

## 2024-05-25 ENCOUNTER — Ambulatory Visit: Payer: Self-pay | Admitting: Cardiology

## 2024-05-27 ENCOUNTER — Other Ambulatory Visit: Payer: Self-pay

## 2024-05-27 NOTE — H&P (View-Only) (Signed)
 Electrophysiology Office Follow up Visit Note:    Date:  05/29/2024   ID:  Bradley Powell, DOB [birth year], MRN [id number]  PCP:  Rexanne Ingle, MD  [facility] Cardiologist:  None  [facility] Electrophysiologist:  Danelle Birmingham, MD    Interval History:     Bradley Powell is a 43 y.o. male who presents for a follow up visit.   I last saw the patient in April 17, 2024.  He has a history of systolic heart failure, obesity, hypertension and gout.  He been hospitalized in the past for ICD shocks in the setting of hypokalemia.  He also had A-V dissociation.  At the last appointment I ordered a FDG PET to evaluate for possible sarcoid.  He is doing okay.  Some fatigue and dyspnea with exertion.  Has not been exercising.  No syncope.  No further ICD shocks since Mar 20, 2024.  There was some confusion today about the sequence of events in May with his shocks and hospitalizations.  We reviewed the medical chart together to clarify.         Past medical, surgical, social and family history were reviewed.  ROS:   Please see the history of present illness.    All other systems reviewed and are negative.  EKGs/Labs/Other Studies Reviewed:    The following studies were reviewed today:  May 29, 2024 in-clinic device interrogation personally reviewed Battery longevity 13 years Lead parameter stable Ventricular pacing less than 1% No programming changes made today  May 20, 2024 EKG shows sinus rhythm, PR 360 ms  October 16, 2023 echo EF 35 to 40%  05/16/2024 FDG PET -  FDG uptake was not observed. LV perfusion is normal.   Left ventricular function is abnormal. Global function is mildly reduced. EF: 42%. End diastolic cavity size is normal. End systolic cavity size is normal.   Coronary calcium  was absent on the attenuation correction CT images.   FDG uptake findings are not consistent with active myocardial inflammation/sarcoidosis.   EKG  Interpretation Date/Time:  Wednesday May 29 2024 08:31:24 EDT Ventricular Rate:  68 PR Interval:    QRS Duration:  98 QT Interval:  418 QTC Calculation: 444 R Axis:   178  Text Interpretation: Sinus rhythm with A-V dissociation and Accelerated Junctional rhythm Confirmed by Cindie Smalls [phone]) on 05/29/2024 8:35:39 AM    Physical Exam:    VS:  BP 100/60 (BP Location: Left Arm, Patient Position: Sitting, Cuff Size: Large)   Pulse 68   Ht 5' 8.5 (1.74 m)   Wt 256 lb (116.1 kg)   SpO2 99%   BMI 38.36 kg/m     Wt Readings from Last 3 Encounters:  05/29/24 256 lb (116.1 kg)  05/20/24 251 lb 6 oz (114 kg)  04/17/24 256 lb (116.1 kg)     GEN: no distress.  Obese CARD: RRR, No MRG RESP: No IWOB. CTAB.      ASSESSMENT:    1. VT (ventricular tachycardia) (HCC)   2. Chronic systolic congestive heart failure (HCC)   3. AV dissociation   4. AV block    PLAN:    In order of problems listed above:  #Chronic systolic heart failure #ICD in situ #A-V dissociation #High degree AV block He has significant conduction system disease that waxes and wanes.  This is suspicious for cardiac sarcoid especially in light of his prior MRI findings.  Given his severely reduced LV function, I do think he would benefit from improved  AV synchrony and VV synchrony.  His QRS duration is approximately 160 ms.  I do believe that he will benefit from CRT-D upgrade.  I have discussed CRT-D upgrade in detail and he wishes to proceed.  Risks, benefits, alternatives to CRT upgrade were discussed in detail with the patient today. The patient understands that the risks include but are not limited to bleeding, infection, pneumothorax, perforation, tamponade, vascular damage, renal failure, MI, stroke, death, and lead dislodgement and wishes to proceed.  He will need to hold his Eliquis  for 3 days prior to upgrade procedure.  #Ventricular tachycardia Continue amiodarone , mexiletine,  metoprolol      Signed, Ole Holts, MD, [facility], [facility] 05/29/2024 9:00 AM    Electrophysiology [facility]

## 2024-05-27 NOTE — Progress Notes (Unsigned)
 Electrophysiology Office Follow up Visit Note:    Date:  05/29/2024   ID:  Bradley Powell, DOB 16-May-1981, MRN [id number]  PCP:  Rexanne Ingle, MD  [facility] Cardiologist:  None  [facility] Electrophysiologist:  Danelle Birmingham, MD    Interval History:     Bradley Powell is a 43 y.o. male who presents for a follow up visit.   I last saw the patient in April 17, 2024.  He has a history of systolic heart failure, obesity, hypertension and gout.  He been hospitalized in the past for ICD shocks in the setting of hypokalemia.  He also had A-V dissociation.  At the last appointment I ordered a FDG PET to evaluate for possible sarcoid.  He is doing okay.  Some fatigue and dyspnea with exertion.  Has not been exercising.  No syncope.  No further ICD shocks since Mar 20, 2024.  There was some confusion today about the sequence of events in May with his shocks and hospitalizations.  We reviewed the medical chart together to clarify.         Past medical, surgical, social and family history were reviewed.  ROS:   Please see the history of present illness.    All other systems reviewed and are negative.  EKGs/Labs/Other Studies Reviewed:    The following studies were reviewed today:  May 29, 2024 in-clinic device interrogation personally reviewed Battery longevity 13 years Lead parameter stable Ventricular pacing less than 1% No programming changes made today  May 20, 2024 EKG shows sinus rhythm, PR 360 ms  October 16, 2023 echo EF 35 to 40%  05/16/2024 FDG PET -  FDG uptake was not observed. LV perfusion is normal.   Left ventricular function is abnormal. Global function is mildly reduced. EF: 42%. End diastolic cavity size is normal. End systolic cavity size is normal.   Coronary calcium  was absent on the attenuation correction CT images.   FDG uptake findings are not consistent with active myocardial inflammation/sarcoidosis.   EKG  Interpretation Date/Time:  Wednesday May 29 2024 08:31:24 EDT Ventricular Rate:  68 PR Interval:    QRS Duration:  98 QT Interval:  418 QTC Calculation: 444 R Axis:   178  Text Interpretation: Sinus rhythm with A-V dissociation and Accelerated Junctional rhythm Confirmed by Cindie Smalls [phone]) on 05/29/2024 8:35:39 AM    Physical Exam:    VS:  BP 100/60 (BP Location: Left Arm, Patient Position: Sitting, Cuff Size: Large)   Pulse 68   Ht 5' 8.5 (1.74 m)   Wt 256 lb (116.1 kg)   SpO2 99%   BMI 38.36 kg/m     Wt Readings from Last 3 Encounters:  05/29/24 256 lb (116.1 kg)  05/20/24 251 lb 6 oz (114 kg)  04/17/24 256 lb (116.1 kg)     GEN: no distress.  Obese CARD: RRR, No MRG RESP: No IWOB. CTAB.      ASSESSMENT:    1. VT (ventricular tachycardia) (HCC)   2. Chronic systolic congestive heart failure (HCC)   3. AV dissociation   4. AV block    PLAN:    In order of problems listed above:  #Chronic systolic heart failure #ICD in situ #A-V dissociation #High degree AV block He has significant conduction system disease that waxes and wanes.  This is suspicious for cardiac sarcoid especially in light of his prior MRI findings.  Given his severely reduced LV function, I do think he would benefit from improved  AV synchrony and VV synchrony.  His QRS duration is approximately 160 ms.  I do believe that he will benefit from CRT-D upgrade.  I have discussed CRT-D upgrade in detail and he wishes to proceed.  Risks, benefits, alternatives to CRT upgrade were discussed in detail with the patient today. The patient understands that the risks include but are not limited to bleeding, infection, pneumothorax, perforation, tamponade, vascular damage, renal failure, MI, stroke, death, and lead dislodgement and wishes to proceed.  He will need to hold his Eliquis  for 3 days prior to upgrade procedure.  #Ventricular tachycardia Continue amiodarone , mexiletine,  metoprolol      Signed, Ole Holts, MD, [facility], [facility] 05/29/2024 9:00 AM    Electrophysiology [facility]

## 2024-05-29 ENCOUNTER — Encounter: Payer: Self-pay | Admitting: Cardiology

## 2024-05-29 ENCOUNTER — Ambulatory Visit: Attending: Cardiology | Admitting: Cardiology

## 2024-05-29 VITALS — BP 100/60 | HR 68 | Ht 68.5 in | Wt 256.0 lb

## 2024-05-29 DIAGNOSIS — I4430 Unspecified atrioventricular block: Secondary | ICD-10-CM

## 2024-05-29 DIAGNOSIS — I5022 Chronic systolic (congestive) heart failure: Secondary | ICD-10-CM

## 2024-05-29 DIAGNOSIS — I4589 Other specified conduction disorders: Secondary | ICD-10-CM | POA: Diagnosis not present

## 2024-05-29 DIAGNOSIS — I472 Ventricular tachycardia, unspecified: Secondary | ICD-10-CM | POA: Diagnosis not present

## 2024-05-29 NOTE — Patient Instructions (Addendum)
 Medication Instructions:  Your physician recommends that you continue on your current medications as directed. Please refer to the Current Medication list given to you today.  *If you need a refill on your cardiac medications before your next appointment, please call your pharmacy*  Lab Work: With in 30 days of procedure.  You may go to any [facility] Location for your lab work:  [facility] - [address] ([facility]) - [address] 512 208 [address]  [place] - [address]   [place]  - [address]   [place] - [address] - [address] [employer]  - [address] - [address] ([facility]   If you have labs (blood work) drawn today and your tests are completely normal, you will receive your results only by: [employer] (if you have MyChart)  If you have any lab test that is abnormal or we need to change your treatment, we will call you or send a MyChart message to review the results.  Testing/Procedures: CRT upgrade  Follow-Up: At [facility], you and your health needs are our priority.  As part of our continuing mission to provide you with exceptional heart care, we have created designated Provider Care Teams.  These Care Teams include your primary Cardiologist (physician) and Advanced Practice Providers (APPs -  Physician Assistants and Nurse Practitioners) who all work together to provide you with the care you need, when you need it.  Your next appointment:   To be scheduled  The format for your next appointment:   In Person  Provider:   Ole Holts, MD or one of the following Advanced Practice Providers on your designated Care Team:   Charlies Arthur, [place] Ozell Jodie Passey, [place] Leotis Barrack, NP  Note: Remote monitoring is used to monitor your Pacemaker/ ICD from home. This monitoring reduces the number of office visits  required to check your device to one time per year. It allows us  to keep an eye on the functioning of your device to ensure it is working properly.

## 2024-05-30 ENCOUNTER — Ambulatory Visit: Payer: Self-pay | Admitting: Family

## 2024-05-30 ENCOUNTER — Other Ambulatory Visit: Payer: Self-pay | Admitting: Family

## 2024-05-30 ENCOUNTER — Other Ambulatory Visit
Admission: RE | Admit: 2024-05-30 | Discharge: 2024-05-30 | Disposition: A | Discharge status: Home / Self Care | Source: Ambulatory Visit | Attending: Family | Admitting: Family

## 2024-05-30 ENCOUNTER — Other Ambulatory Visit: Payer: Self-pay

## 2024-05-30 DIAGNOSIS — I472 Ventricular tachycardia, unspecified: Secondary | ICD-10-CM | POA: Diagnosis not present

## 2024-05-30 LAB — BASIC METABOLIC PANEL WITH GFR
Anion gap: 9 (ref 5–15)
BUN: 48 mg/dL — ABNORMAL HIGH (ref 6–20)
CO2: 23 mmol/L (ref 22–32)
Calcium: 8.8 mg/dL — ABNORMAL LOW (ref 8.9–10.3)
Chloride: 103 mmol/L (ref 98–111)
Creatinine, Ser: 2.51 mg/dL — ABNORMAL HIGH (ref 0.61–1.24)
GFR, Estimated: 32 mL/min — ABNORMAL LOW (ref >60)
Glucose, Bld: 88 mg/dL (ref 70–99)
Potassium: 5.1 mmol/L (ref 3.5–5.1)
Sodium: 135 mmol/L (ref 135–145)

## 2024-05-31 ENCOUNTER — Other Ambulatory Visit: Payer: Self-pay | Admitting: Family

## 2024-05-31 MED ORDER — POTASSIUM CHLORIDE CRYS ER 20 MEQ PO TBCR: EXTENDED_RELEASE_TABLET | ORAL | Status: DC

## 2024-06-04 ENCOUNTER — Other Ambulatory Visit: Payer: Self-pay

## 2024-06-06 ENCOUNTER — Telehealth: Payer: Self-pay

## 2024-06-06 DIAGNOSIS — I472 Ventricular tachycardia, unspecified: Secondary | ICD-10-CM

## 2024-06-06 DIAGNOSIS — I4891 Unspecified atrial fibrillation: Secondary | ICD-10-CM

## 2024-06-06 DIAGNOSIS — I4430 Unspecified atrioventricular block: Secondary | ICD-10-CM

## 2024-06-06 DIAGNOSIS — I5022 Chronic systolic (congestive) heart failure: Secondary | ICD-10-CM

## 2024-06-06 NOTE — Telephone Encounter (Signed)
 Spoke with the patient about the need to schedule him for a CRT-D upgrade with Dr. Cindie. Advised that I am waiting to confirm an available date, but that he has been added to the wait list. Will reach out once confirmed.

## 2024-06-07 NOTE — Addendum Note (Signed)
 Addended by: CHAUVIGNE, Jaasia Viglione on: 06/07/2024 11:20 AM   Modules accepted: Orders

## 2024-06-12 DIAGNOSIS — I472 Ventricular tachycardia, unspecified: Secondary | ICD-10-CM | POA: Diagnosis not present

## 2024-06-12 DIAGNOSIS — Z8249 Family history of ischemic heart disease and other diseases of the circulatory system: Secondary | ICD-10-CM | POA: Diagnosis not present

## 2024-06-12 DIAGNOSIS — K219 Gastro-esophageal reflux disease without esophagitis: Secondary | ICD-10-CM | POA: Diagnosis not present

## 2024-06-12 DIAGNOSIS — E876 Hypokalemia: Secondary | ICD-10-CM | POA: Diagnosis not present

## 2024-06-12 DIAGNOSIS — E039 Hypothyroidism, unspecified: Secondary | ICD-10-CM | POA: Diagnosis not present

## 2024-06-12 DIAGNOSIS — I272 Pulmonary hypertension, unspecified: Secondary | ICD-10-CM | POA: Diagnosis not present

## 2024-06-12 DIAGNOSIS — G4733 Obstructive sleep apnea (adult) (pediatric): Secondary | ICD-10-CM | POA: Diagnosis not present

## 2024-06-12 DIAGNOSIS — Z7901 Long term (current) use of anticoagulants: Secondary | ICD-10-CM | POA: Diagnosis not present

## 2024-06-12 DIAGNOSIS — E785 Hyperlipidemia, unspecified: Secondary | ICD-10-CM | POA: Diagnosis not present

## 2024-06-12 DIAGNOSIS — M109 Gout, unspecified: Secondary | ICD-10-CM | POA: Diagnosis not present

## 2024-06-12 DIAGNOSIS — Z7984 Long term (current) use of oral hypoglycemic drugs: Secondary | ICD-10-CM | POA: Diagnosis not present

## 2024-06-17 ENCOUNTER — Other Ambulatory Visit
Admission: RE | Admit: 2024-06-17 | Discharge: 2024-06-17 | Disposition: A | Discharge status: Home / Self Care | Source: Ambulatory Visit | Attending: Cardiology | Admitting: Cardiology

## 2024-06-17 ENCOUNTER — Telehealth (HOSPITAL_COMMUNITY): Payer: Self-pay

## 2024-06-17 DIAGNOSIS — I4430 Unspecified atrioventricular block: Secondary | ICD-10-CM | POA: Diagnosis not present

## 2024-06-17 DIAGNOSIS — I4891 Unspecified atrial fibrillation: Secondary | ICD-10-CM | POA: Insufficient documentation

## 2024-06-17 DIAGNOSIS — I5022 Chronic systolic (congestive) heart failure: Secondary | ICD-10-CM | POA: Diagnosis not present

## 2024-06-17 DIAGNOSIS — I472 Ventricular tachycardia, unspecified: Secondary | ICD-10-CM | POA: Insufficient documentation

## 2024-06-17 LAB — CBC WITH DIFFERENTIAL/PLATELET
Abs Immature Granulocytes: 0.02 K/uL (ref 0.00–0.07)
Basophils Absolute: 0 K/uL (ref 0.0–0.1)
Basophils Relative: 1 %
Eosinophils Absolute: 0.1 K/uL (ref 0.0–0.5)
Eosinophils Relative: 1 %
HCT: 42.5 % (ref 39.0–52.0)
Hemoglobin: 13.1 g/dL (ref 13.0–17.0)
Immature Granulocytes: 0 %
Lymphocytes Relative: 6 %
Lymphs Abs: 0.4 K/uL — ABNORMAL LOW (ref 0.7–4.0)
MCH: 22.8 pg — ABNORMAL LOW (ref 26.0–34.0)
MCHC: 30.8 g/dL (ref 30.0–36.0)
MCV: 74 fL — ABNORMAL LOW (ref 80.0–100.0)
Monocytes Absolute: 0.9 K/uL (ref 0.1–1.0)
Monocytes Relative: 14 %
Neutro Abs: 4.6 K/uL (ref 1.7–7.7)
Neutrophils Relative %: 78 %
Platelets: 243 K/uL (ref 150–400)
RBC: 5.74 MIL/uL (ref 4.22–5.81)
RDW: 20.7 % — ABNORMAL HIGH (ref 11.5–15.5)
WBC: 6 K/uL (ref 4.0–10.5)
nRBC: 0 % (ref 0.0–0.2)

## 2024-06-17 LAB — BASIC METABOLIC PANEL WITH GFR
Anion gap: 11 (ref 5–15)
BUN: 55 mg/dL — ABNORMAL HIGH (ref 6–20)
CO2: 27 mmol/L (ref 22–32)
Calcium: 8.6 mg/dL — ABNORMAL LOW (ref 8.9–10.3)
Chloride: 98 mmol/L (ref 98–111)
Creatinine, Ser: 2.84 mg/dL — ABNORMAL HIGH (ref 0.61–1.24)
GFR, Estimated: 28 mL/min — ABNORMAL LOW (ref >60)
Glucose, Bld: 109 mg/dL — ABNORMAL HIGH (ref 70–99)
Potassium: 4.2 mmol/L (ref 3.5–5.1)
Sodium: 136 mmol/L (ref 135–145)

## 2024-06-17 NOTE — Telephone Encounter (Signed)
 Spoke with patient to discuss upcoming procedure.   Confirmed patient is scheduled for a BIV Upgrade, Lead Insertion on Monday, August 11 with Dr. Ole Holts. Instructed patient to arrive at the Main Entrance A at [facility]: [address] [place], [place] [address] and check in at Admitting at 10:30 AM.   Labs completed. Dr Holts made aware of abnormal labs.  Any recent signs of acute illness or been started on antibiotics? No Any new medications started? No Any medications to hold? Hold Eliquis  and Jardiance  for 3 days prior to the procedure- last dose on August 07. Medication instructions:  On the morning of your procedure DO NOT take any medication. No eating or drinking after midnight prior to procedure.   The night before your procedure and the morning of your procedure, wash thoroughly with the CHG surgical soap from the neck down, paying special attention to the area where your procedure will be performed.  Advised of plan to go home the same day and will only stay overnight if medically necessary. You MUST have a responsible adult to drive you home and MUST be with you the first 24 hours after you arrive home.  Patient verbalized understanding to all instructions provided and agreed to proceed with procedure.

## 2024-06-18 ENCOUNTER — Other Ambulatory Visit: Payer: Self-pay

## 2024-06-18 ENCOUNTER — Other Ambulatory Visit (HOSPITAL_COMMUNITY): Payer: Self-pay | Admitting: Internal Medicine

## 2024-06-18 MED ORDER — ATORVASTATIN CALCIUM 40 MG PO TABS
40.0000 mg | ORAL_TABLET | Freq: Every day | ORAL | 2 refills | Status: DC
  Filled 2024-06-18: qty 30, 30d supply, fill #0
  Filled 2024-06-23 – 2024-07-18 (×2): qty 30, 30d supply, fill #1

## 2024-06-18 MED ORDER — MEXILETINE HCL 150 MG PO CAPS
150.0000 mg | ORAL_CAPSULE | Freq: Two times a day (BID) | ORAL | 2 refills | Status: DC
  Filled 2024-06-18: qty 60, 30d supply, fill #0
  Filled 2024-07-18: qty 60, 30d supply, fill #1

## 2024-06-18 MED ORDER — AMIODARONE HCL 200 MG PO TABS
200.0000 mg | ORAL_TABLET | Freq: Every day | ORAL | 2 refills | Status: DC
  Filled 2024-06-18 – 2024-06-23 (×2): qty 90, 90d supply, fill #0
  Filled 2024-07-07: qty 30, 30d supply, fill #0

## 2024-06-19 ENCOUNTER — Other Ambulatory Visit: Payer: Self-pay

## 2024-06-19 DIAGNOSIS — R946 Abnormal results of thyroid function studies: Secondary | ICD-10-CM | POA: Diagnosis not present

## 2024-06-21 ENCOUNTER — Other Ambulatory Visit: Payer: Self-pay

## 2024-06-21 MED ORDER — LEVOTHYROXINE SODIUM 75 MCG PO TABS
75.0000 ug | ORAL_TABLET | Freq: Every morning | ORAL | 0 refills | Status: DC
  Filled 2024-06-21: qty 30, 30d supply, fill #0
  Filled 2024-07-24: qty 30, 30d supply, fill #1

## 2024-06-24 ENCOUNTER — Encounter (HOSPITAL_COMMUNITY): Payer: Self-pay | Admitting: Certified Registered Nurse Anesthetist

## 2024-06-24 ENCOUNTER — Other Ambulatory Visit: Payer: Self-pay

## 2024-06-24 ENCOUNTER — Encounter: Payer: Self-pay | Admitting: Emergency Medicine

## 2024-06-24 ENCOUNTER — Ambulatory Visit (HOSPITAL_COMMUNITY)
Admission: RE | Admit: 2024-06-24 | Discharge: 2024-06-25 | Disposition: A | Discharge status: Home / Self Care | Attending: Cardiology | Admitting: Cardiology

## 2024-06-24 ENCOUNTER — Ambulatory Visit (HOSPITAL_COMMUNITY): Admission: RE | Admit: 2024-06-24 | Source: Home / Self Care | Admitting: Cardiology

## 2024-06-24 ENCOUNTER — Ambulatory Visit (HOSPITAL_COMMUNITY): Admitting: Certified Registered Nurse Anesthetist

## 2024-06-24 ENCOUNTER — Encounter (HOSPITAL_COMMUNITY): Admission: RE | Payer: Self-pay | Source: Home / Self Care

## 2024-06-24 ENCOUNTER — Encounter (HOSPITAL_COMMUNITY)
Admission: RE | Disposition: A | Discharge status: Home / Self Care | Payer: Self-pay | Source: Home / Self Care | Attending: Cardiology

## 2024-06-24 DIAGNOSIS — Z6838 Body mass index (BMI) 38.0-38.9, adult: Secondary | ICD-10-CM | POA: Diagnosis not present

## 2024-06-24 DIAGNOSIS — E039 Hypothyroidism, unspecified: Secondary | ICD-10-CM | POA: Diagnosis not present

## 2024-06-24 DIAGNOSIS — N183 Chronic kidney disease, stage 3 unspecified: Secondary | ICD-10-CM | POA: Diagnosis not present

## 2024-06-24 DIAGNOSIS — I110 Hypertensive heart disease with heart failure: Secondary | ICD-10-CM | POA: Diagnosis not present

## 2024-06-24 DIAGNOSIS — Z95810 Presence of automatic (implantable) cardiac defibrillator: Secondary | ICD-10-CM | POA: Diagnosis present

## 2024-06-24 DIAGNOSIS — I442 Atrioventricular block, complete: Secondary | ICD-10-CM | POA: Diagnosis not present

## 2024-06-24 DIAGNOSIS — I5022 Chronic systolic (congestive) heart failure: Secondary | ICD-10-CM | POA: Diagnosis not present

## 2024-06-24 DIAGNOSIS — I130 Hypertensive heart and chronic kidney disease with heart failure and stage 1 through stage 4 chronic kidney disease, or unspecified chronic kidney disease: Secondary | ICD-10-CM

## 2024-06-24 DIAGNOSIS — I5042 Chronic combined systolic (congestive) and diastolic (congestive) heart failure: Secondary | ICD-10-CM | POA: Diagnosis not present

## 2024-06-24 DIAGNOSIS — I472 Ventricular tachycardia, unspecified: Secondary | ICD-10-CM | POA: Insufficient documentation

## 2024-06-24 DIAGNOSIS — G4730 Sleep apnea, unspecified: Secondary | ICD-10-CM | POA: Diagnosis not present

## 2024-06-24 DIAGNOSIS — E876 Hypokalemia: Secondary | ICD-10-CM | POA: Diagnosis not present

## 2024-06-24 DIAGNOSIS — M109 Gout, unspecified: Secondary | ICD-10-CM | POA: Insufficient documentation

## 2024-06-24 DIAGNOSIS — E669 Obesity, unspecified: Secondary | ICD-10-CM | POA: Diagnosis not present

## 2024-06-24 DIAGNOSIS — K219 Gastro-esophageal reflux disease without esophagitis: Secondary | ICD-10-CM | POA: Diagnosis not present

## 2024-06-24 HISTORY — PX: BIV UPGRADE: EP1202

## 2024-06-24 HISTORY — PX: LEAD INSERTION: EP1212

## 2024-06-24 SURGERY — BIV UPGRADE
Anesthesia: Monitor Anesthesia Care

## 2024-06-24 SURGERY — BIV UPGRADE

## 2024-06-24 MED ORDER — POVIDONE-IODINE 10 % EX SWAB
2.0000 | Freq: Once | CUTANEOUS | Status: AC
  Administered 2024-06-24 (×2): 2 via TOPICAL

## 2024-06-24 MED ORDER — CEFAZOLIN SODIUM-DEXTROSE 2-4 GM/100ML-% IV SOLN
2.0000 g | INTRAVENOUS | Status: AC
  Administered 2024-06-24 (×2): 2 g via INTRAVENOUS

## 2024-06-24 MED ORDER — ACETAMINOPHEN 325 MG PO TABS
325.0000 mg | ORAL_TABLET | ORAL | Status: DC | PRN
  Administered 2024-06-25 (×2): 650 mg via ORAL
  Filled 2024-06-24: qty 2

## 2024-06-24 MED ORDER — SODIUM CHLORIDE 0.9 % IV SOLN: INTRAVENOUS | Status: DC

## 2024-06-24 MED ORDER — ATORVASTATIN CALCIUM 40 MG PO TABS
40.0000 mg | ORAL_TABLET | Freq: Every day | ORAL | Status: DC
  Administered 2024-06-25 (×2): 40 mg via ORAL
  Filled 2024-06-24: qty 1

## 2024-06-24 MED ORDER — CEFAZOLIN SODIUM-DEXTROSE 2-4 GM/100ML-% IV SOLN
INTRAVENOUS | Status: AC
  Filled 2024-06-24: qty 100

## 2024-06-24 MED ORDER — HEPARIN (PORCINE) IN NACL 1000-0.9 UT/500ML-% IV SOLN
INTRAVENOUS | Status: DC | PRN
  Administered 2024-06-24 (×4): 500 mL

## 2024-06-24 MED ORDER — AMIODARONE HCL 200 MG PO TABS
200.0000 mg | ORAL_TABLET | Freq: Every day | ORAL | Status: DC
  Administered 2024-06-25 (×2): 200 mg via ORAL
  Filled 2024-06-24: qty 1

## 2024-06-24 MED ORDER — POTASSIUM CHLORIDE CRYS ER 20 MEQ PO TBCR
60.0000 meq | EXTENDED_RELEASE_TABLET | Freq: Every day | ORAL | Status: DC
  Administered 2024-06-24 (×2): 60 meq via ORAL
  Filled 2024-06-24: qty 3

## 2024-06-24 MED ORDER — METOPROLOL SUCCINATE ER 25 MG PO TB24
25.0000 mg | ORAL_TABLET | Freq: Every day | ORAL | Status: DC
  Administered 2024-06-25 (×2): 25 mg via ORAL
  Filled 2024-06-24: qty 1

## 2024-06-24 MED ORDER — SPIRONOLACTONE 25 MG PO TABS
100.0000 mg | ORAL_TABLET | Freq: Every day | ORAL | Status: DC
  Administered 2024-06-24 – 2024-06-25 (×4): 100 mg via ORAL
  Filled 2024-06-24 (×2): qty 4

## 2024-06-24 MED ORDER — POTASSIUM CHLORIDE CRYS ER 20 MEQ PO TBCR
60.0000 meq | EXTENDED_RELEASE_TABLET | Freq: Every day | ORAL | Status: DC
  Administered 2024-06-25 (×2): 60 meq via ORAL
  Filled 2024-06-24: qty 3

## 2024-06-24 MED ORDER — PANTOPRAZOLE SODIUM 40 MG PO TBEC
40.0000 mg | DELAYED_RELEASE_TABLET | Freq: Every day | ORAL | Status: DC
  Administered 2024-06-24 – 2024-06-25 (×4): 40 mg via ORAL
  Filled 2024-06-24 (×2): qty 1

## 2024-06-24 MED ORDER — PROPOFOL 500 MG/50ML IV EMUL
INTRAVENOUS | Status: DC | PRN
  Administered 2024-06-24 (×2): 150 ug/kg/min via INTRAVENOUS

## 2024-06-24 MED ORDER — LIDOCAINE HCL 1 % IJ SOLN
INTRAMUSCULAR | Status: AC
  Filled 2024-06-24: qty 60

## 2024-06-24 MED ORDER — POTASSIUM CHLORIDE CRYS ER 20 MEQ PO TBCR
80.0000 meq | EXTENDED_RELEASE_TABLET | Freq: Every morning | ORAL | Status: DC
  Administered 2024-06-25 (×2): 80 meq via ORAL
  Filled 2024-06-24: qty 4

## 2024-06-24 MED ORDER — SODIUM CHLORIDE 0.9 % IV SOLN: 80.0000 mg | INTRAVENOUS | Status: AC

## 2024-06-24 MED ORDER — LEVOTHYROXINE SODIUM 75 MCG PO TABS
75.0000 ug | ORAL_TABLET | Freq: Every morning | ORAL | Status: DC
  Administered 2024-06-25 (×2): 75 ug via ORAL
  Filled 2024-06-24: qty 1

## 2024-06-24 MED ORDER — SODIUM CHLORIDE 0.9 % IV SOLN
INTRAVENOUS | Status: AC
  Administered 2024-06-24 (×2): 80 mg
  Filled 2024-06-24: qty 2

## 2024-06-24 MED ORDER — MEXILETINE HCL 150 MG PO CAPS
150.0000 mg | ORAL_CAPSULE | Freq: Two times a day (BID) | ORAL | Status: DC
  Administered 2024-06-24 – 2024-06-25 (×4): 150 mg via ORAL
  Filled 2024-06-24 (×2): qty 1

## 2024-06-24 MED ORDER — PHENYLEPHRINE HCL-NACL 20-0.9 MG/250ML-% IV SOLN
INTRAVENOUS | Status: DC | PRN
  Administered 2024-06-24 (×2): 25 ug/min via INTRAVENOUS

## 2024-06-24 MED ORDER — CHLORHEXIDINE GLUCONATE 4 % EX SOLN: 4.0000 | Freq: Once | CUTANEOUS | Status: DC

## 2024-06-24 MED ORDER — LIDOCAINE HCL (PF) 1 % IJ SOLN
INTRAMUSCULAR | Status: DC | PRN
Start: 2024-06-24 — End: 2024-06-24
  Administered 2024-06-24 (×2): 50 mL

## 2024-06-24 MED ORDER — ONDANSETRON HCL 4 MG/2ML IJ SOLN: 4.0000 mg | Freq: Four times a day (QID) | INTRAMUSCULAR | Status: DC | PRN

## 2024-06-24 SURGICAL SUPPLY — 22 items
CABLE SURGICAL S-101-97-12 (CABLE) ×1 IMPLANT
CATH ACUITYPRO 45CM EH 9F (CATHETERS) IMPLANT
CATH ACUITYPRO 45CM H 9F (CATHETERS) IMPLANT
CATH ACUITYPRO 45CM MP 9F (CATHETERS) IMPLANT
CATH CPS LOCATOR 3D MED (CATHETERS) IMPLANT
CATH JOSEPH QUAD ALLRED 6F REP (CATHETERS) IMPLANT
CATH SELECT PACE 669183 (CATHETERS) IMPLANT
GUIDEWIRE ANGLED .035X150CM (WIRE) IMPLANT
ICD MOMENTUM X4 CRT-D G138 (ICD Generator) IMPLANT
LEAD ACUITY X4 4671 (Lead) IMPLANT
LEAD ACUITY X4 4677 (Lead) IMPLANT
LEAD INGEVITY 7841 52 (Lead) IMPLANT
LEAD INGEVITY 7842 59 (Lead) IMPLANT
MAT PREVALON FULL STRYKER (MISCELLANEOUS) IMPLANT
PAD DEFIB RADIO PHYSIO CONN (PAD) ×1 IMPLANT
POUCH AIGIS-R ANTIBACT ICD LRG (Mesh General) IMPLANT
SHEATH 7FR PRELUDE SNAP 13 (SHEATH) IMPLANT
SHEATH 9.5FR PRELUDE SNAP 13 (SHEATH) IMPLANT
TRAY PACEMAKER INSERTION (PACKS) ×1 IMPLANT
WIRE ACUITY WHISPER EDS 4648 (WIRE) IMPLANT
WIRE HI TORQ VERSACORE-J 145CM (WIRE) IMPLANT
WIRE MAILMAN 182CM (WIRE) IMPLANT

## 2024-06-24 NOTE — Anesthesia Preprocedure Evaluation (Addendum)
 Anesthesia Evaluation  Patient identified by MRN, date of birth, ID band Patient awake    Reviewed: Allergy & Precautions, NPO status , Patient's Chart, lab work & pertinent test results  History of Anesthesia Complications Negative for: history of anesthetic complications  Airway Mallampati: II  TM Distance: >3 FB Neck ROM: Full    Dental  (+) Dental Advisory Given   Pulmonary sleep apnea and Continuous Positive Airway Pressure Ventilation    breath sounds clear to auscultation       Cardiovascular hypertension, Pt. on medications (-) angina +CHF  + dysrhythmias Atrial Fibrillation + Cardiac Defibrillator  Rhythm:Regular Rate:Normal  TTE 2024: IMPRESSIONS     1. Limited study for LV function   2. Left ventricular ejection fraction, by estimation, is 35 to 40%. Left  ventricular ejection fraction by 2D MOD biplane is 37.9 %. The left  ventricle has moderately decreased function. The left ventricle  demonstrates global hypokinesis. There is mild  left ventricular hypertrophy.   3. The mitral valve is abnormal. Mild mitral valve regurgitation.   4. Tricuspid valve regurgitation is moderate.   5. Right ventricular systolic function is mildly reduced. The right  ventricular size is normal. There is normal pulmonary artery systolic  pressure. The estimated right ventricular systolic pressure is 35.2 mmHg.   6. The inferior vena cava is dilated in size with <50% respiratory  variability, suggesting right atrial pressure of 15 mmHg.   7. Rhythm strip during this exam demonstrates atrial flutter.     Neuro/Psych negative neurological ROS     GI/Hepatic Neg liver ROS,GERD  Medicated,,  Endo/Other  Hypothyroidism    Renal/GU Renal Insufficiency and CRFRenal disease     Musculoskeletal   Abdominal   Peds  Hematology   Anesthesia Other Findings   Reproductive/Obstetrics                               Anesthesia Physical Anesthesia Plan  ASA: 4  Anesthesia Plan: General   Post-op Pain Management: Minimal or no pain anticipated   Induction: Intravenous  PONV Risk Score and Plan: 2  Airway Management Planned: Oral ETT  Additional Equipment: None  Intra-op Plan:   Post-operative Plan:   Informed Consent: I have reviewed the patients History and Physical, chart, labs and discussed the procedure including the risks, benefits and alternatives for the proposed anesthesia with the patient or authorized representative who has indicated his/her understanding and acceptance.     Dental advisory given  Plan Discussed with: CRNA and Surgeon  Anesthesia Plan Comments:         Anesthesia Quick Evaluation

## 2024-06-24 NOTE — Plan of Care (Signed)
  Problem: Education: Goal: Knowledge of cardiac device and self-care will improve Outcome: Progressing   Problem: Education: Goal: Ability to safely manage health related needs after discharge will improve Outcome: Progressing   Problem: Cardiac: Goal: Ability to achieve and maintain adequate cardiopulmonary perfusion will improve Outcome: Progressing

## 2024-06-24 NOTE — Discharge Summary (Addendum)
 ELECTROPHYSIOLOGY PROCEDURE DISCHARGE SUMMARY    Patient ID: Bradley Powell,  MRN: [id number], DOB/AGE: 02/25/1981 43 y.o.  Admit date: 06/24/2024 Discharge date: 06/25/2024  Primary Care Physician: Bradley Ingle, MD  Primary Cardiologist: None  Electrophysiologist: Dr. Cindie    Primary Diagnosis:  Chronic Systolic CHF    Secondary Diagnosis: VT Intermittent High Degree AV Block HTN Obesity  Gout  Hypokalemia    Allergies  Allergen Reactions   Entresto  [Sacubitril -Valsartan ] Nausea And Vomiting and Other (See Comments)    Lightheaded, fainting     Procedures This Admission:  1.  Upgrade of single chamber ICD to a [employer] BiV ICD on 06/24/24 by Dr. Cindie.  The patient received a [employer] X4 CRT-D G138 with [employer] [phone] right atrial lead and pre-existing [employer] [phone] right ventricular lead. Pt received a [employer] X4 [id number]  LV lead. DFTs were deferred at time of implant There were no post procedure complications 2.  CXR on 06/25/24 demonstrated no pneumothorax status post device implantation.      Brief HPI: OSHUA Powell is a 43 y.o. male was referred to electrophysiology in the outpatient setting  for consideration of ICD implantation.  Past medical history includes above.  The patient has persistent LV dysfunction despite guideline directed therapy.  Risks, benefits, and alternatives to ICD implantation were reviewed with the patient who wished to proceed.   Hospital Course:  The patient was admitted and underwent upgrade from a single chamber ICD to a [employer] BiV ICD with details as outlined above. They were monitored on telemetry overnight which demonstrated appropriate pacing .  Left chest was without hematoma or ecchymosis.  The device was interrogated and found to be functioning normally.  CXR was obtained and demonstrated no pneumothorax status post device  implantation..  Wound care, arm mobility, and restrictions were reviewed with the patient.  The patient was examined and considered stable for discharge to home.   The patient's discharge medications include an metoprolol , spironolactone , torsemide  & jardiance . Pt intolerant of ACE/ARB due to hypotension.   Anticoagulation resumption This patient should resume their Eliquis  on 06/30/24  Physical Exam: Vitals:   06/24/24 2022 06/25/24 0054 06/25/24 0510 06/25/24 0855  BP: 104/83 103/63 (!) 97/53 116/87  Pulse: 60  79 83  Resp: 18 18 18 18   Temp: (!) 97.4 F (36.3 C) 97.6 F (36.4 C) 99.1 F (37.3 C) 99 F (37.2 C)  TempSrc: Oral Oral Oral Oral  SpO2: 90% 95% 93% 100%  Weight:   120.8 kg   Height:        GEN- NAD. A&O x 3.  HEENT: Normocephalic, atraumatic Lungs- CTAB, normal effort.  Heart- RRR. No M/G/R.  GI- Soft, NT, ND.  Extremities- No clubbing, cyanosis, or edema Skin- Warm and dry, no rash or lesion. ICD site without hematoma, steri strips intact clean/dry, no erythema or significant edema  Discharge Medications:  Allergies as of 06/25/2024       Reactions   Entresto  [sacubitril -valsartan ] Nausea And Vomiting, Other (See Comments)   Lightheaded, fainting        Medication List     PAUSE taking these medications    Eliquis  5 MG Tabs tablet Wait to take this until: June 30, 2024 Generic drug: apixaban  TAKE 1 TABLET BY MOUTH TWICE A DAY       TAKE these medications    acetaminophen  500 MG tablet Commonly known as: TYLENOL  Take 1,000  mg by mouth every 6 (six) hours as needed for mild pain (pain score 1-3) or moderate pain (pain score 4-6).   allopurinol  100 MG tablet Commonly known as: ZYLOPRIM  Take 1 tablet (100 mg total) by mouth daily. Please contact PCP for further refills   amiodarone  200 MG tablet Commonly known as: PACERONE  Take 1 tablet (200 mg total) by mouth daily. What changed: when to take this   atorvastatin  40 MG tablet Commonly  known as: LIPITOR Take 1 tablet (40 mg total) by mouth daily.   colchicine  0.6 MG tablet Take 1 tablet (0.6 mg total) by mouth daily. What changed:  when to take this reasons to take this   esomeprazole  40 MG capsule Commonly known as: NEXIUM  Take 1 capsule (40 mg total) by mouth 2 (two) times daily.   Jardiance  10 MG Tabs tablet Generic drug: empagliflozin  Take 1 tablet (10 mg total) by mouth daily.   levothyroxine  50 MCG tablet Commonly known as: SYNTHROID  TAKE 1 TABLET (50 MCG TOTAL) BY MOUTH DAILY BEFORE BREAKFAST. TAKE 1 HOUR BEFORE BREAKFAST   levothyroxine  75 MCG tablet Commonly known as: SYNTHROID  Take 1 tablet (75 mcg total) by mouth every morning ON AN EMPTY STOMACH.   metolazone  5 MG tablet Commonly known as: ZAROXOLYN  Take 5 mg by mouth daily. What changed: Another medication with the same name was changed. Make sure you understand how and when to take each.   metolazone  2.5 MG tablet Commonly known as: ZAROXOLYN  Take 1 tablet by mouth every Sunday and 2 tablets every Wednesday What changed:  how much to take how to take this when to take this additional instructions   metoprolol  succinate 25 MG 24 hr tablet Commonly known as: Toprol  XL Take 1 tablet (25 mg total) by mouth daily.   mexiletine 150 MG capsule Commonly known as: MEXITIL  Take 1 capsule (150 mg total) by mouth every 12 (twelve) hours.   polyethylene glycol powder 17 GM/SCOOP powder Commonly known as: GLYCOLAX /MIRALAX  Take 1 CAPFUL (17 g) by mouth daily as needed.   potassium chloride  SA 20 MEQ tablet Commonly known as: KLOR-CON  M Take 4 tablets (80 mEq total) by mouth in the morning AND 3 tablets (60 mEq total) daily at 12 noon AND 3 tablets (60 mEq total) at bedtime.   spironolactone  100 MG tablet Commonly known as: ALDACTONE  Take 1 tablet (100 mg total) by mouth daily.   torsemide  20 MG tablet Commonly known as: DEMADEX  Take 4 tablets (80 mg total) by mouth in the morning AND 3  tablets (60 mg total) every evening.   Voltaren  1 % Gel Generic drug: diclofenac  Sodium Apply 1 Application topically daily as needed (pain).        Potassium script sent to pharmacy at patients request. He was apparently completely out at time of admission.    Disposition: Home with usual follow up as in AVS  Duration of Discharge Encounter:  APP time: 35 minutes  Signed, Daphne Barrack, NP-C, AGACNP-BC [facility] - Electrophysiology  06/25/2024, 3:19 PM

## 2024-06-24 NOTE — Interval H&P Note (Signed)
 History and Physical Interval Note:  06/24/2024 11:15 AM  Bradley Powell  has presented today for surgery, with the diagnosis of heart failure.  The various methods of treatment have been discussed with the patient and family. After consideration of risks, benefits and other options for treatment, the patient has consented to  Procedure(s): BIV UPGRADE (N/A) LEAD INSERTION (N/A) as a surgical intervention.  The patient's history has been reviewed, patient examined, no change in status, stable for surgery.  I have reviewed the patient's chart and labs.  Questions were answered to the patient's satisfaction.     [name] T Soni Kegel

## 2024-06-24 NOTE — Transfer of Care (Signed)
 Immediate Anesthesia Transfer of Care Note  Patient: DALLIS CZAJA  Procedure(s) Performed: BIV UPGRADE LEAD INSERTION  Patient Location: PACU  Anesthesia Type:MAC  Level of Consciousness: awake, alert , and oriented  Airway & Oxygen Therapy: Patient Spontanous Breathing and Patient connected to face mask oxygen  Post-op Assessment: Report given to RN and Post -op Vital signs reviewed and stable  Post vital signs: Reviewed and stable  Last Vitals:  Vitals Value Taken Time  BP    Temp    Pulse 79 06/24/24 15:30  Resp 40 06/24/24 15:30  SpO2 98 % 06/24/24 15:30  Vitals shown include unfiled device data.  Last Pain:  Vitals:   06/24/24 1043  PainSc: 0-No pain         Complications: No notable events documented.

## 2024-06-25 ENCOUNTER — Other Ambulatory Visit: Payer: Self-pay | Admitting: Family

## 2024-06-25 ENCOUNTER — Other Ambulatory Visit (HOSPITAL_COMMUNITY): Payer: Self-pay | Admitting: Family Medicine

## 2024-06-25 ENCOUNTER — Encounter: Payer: Self-pay | Admitting: Pulmonary Disease

## 2024-06-25 ENCOUNTER — Other Ambulatory Visit: Payer: Self-pay

## 2024-06-25 ENCOUNTER — Ambulatory Visit (HOSPITAL_COMMUNITY)

## 2024-06-25 ENCOUNTER — Encounter (HOSPITAL_COMMUNITY): Payer: Self-pay | Admitting: Cardiology

## 2024-06-25 ENCOUNTER — Other Ambulatory Visit: Payer: Self-pay | Admitting: Cardiology

## 2024-06-25 DIAGNOSIS — I472 Ventricular tachycardia, unspecified: Secondary | ICD-10-CM | POA: Diagnosis not present

## 2024-06-25 DIAGNOSIS — G4730 Sleep apnea, unspecified: Secondary | ICD-10-CM | POA: Diagnosis not present

## 2024-06-25 DIAGNOSIS — I110 Hypertensive heart disease with heart failure: Secondary | ICD-10-CM | POA: Diagnosis not present

## 2024-06-25 DIAGNOSIS — I517 Cardiomegaly: Secondary | ICD-10-CM | POA: Diagnosis not present

## 2024-06-25 DIAGNOSIS — M109 Gout, unspecified: Secondary | ICD-10-CM | POA: Diagnosis not present

## 2024-06-25 DIAGNOSIS — I442 Atrioventricular block, complete: Secondary | ICD-10-CM | POA: Diagnosis not present

## 2024-06-25 DIAGNOSIS — Z6838 Body mass index (BMI) 38.0-38.9, adult: Secondary | ICD-10-CM | POA: Diagnosis not present

## 2024-06-25 DIAGNOSIS — Z950 Presence of cardiac pacemaker: Secondary | ICD-10-CM | POA: Diagnosis not present

## 2024-06-25 DIAGNOSIS — I5022 Chronic systolic (congestive) heart failure: Secondary | ICD-10-CM | POA: Diagnosis not present

## 2024-06-25 DIAGNOSIS — K219 Gastro-esophageal reflux disease without esophagitis: Secondary | ICD-10-CM | POA: Diagnosis not present

## 2024-06-25 DIAGNOSIS — E876 Hypokalemia: Secondary | ICD-10-CM | POA: Diagnosis not present

## 2024-06-25 DIAGNOSIS — E669 Obesity, unspecified: Secondary | ICD-10-CM | POA: Diagnosis not present

## 2024-06-25 DIAGNOSIS — R0989 Other specified symptoms and signs involving the circulatory and respiratory systems: Secondary | ICD-10-CM | POA: Diagnosis not present

## 2024-06-25 LAB — BASIC METABOLIC PANEL WITH GFR
Anion gap: 16 — ABNORMAL HIGH (ref 5–15)
BUN: 50 mg/dL — ABNORMAL HIGH (ref 6–20)
CO2: 18 mmol/L — ABNORMAL LOW (ref 22–32)
Calcium: 9 mg/dL (ref 8.9–10.3)
Chloride: 102 mmol/L (ref 98–111)
Creatinine, Ser: 3.01 mg/dL — ABNORMAL HIGH (ref 0.61–1.24)
GFR, Estimated: 26 mL/min — ABNORMAL LOW (ref >60)
Glucose, Bld: 87 mg/dL (ref 70–99)
Potassium: 5.1 mmol/L (ref 3.5–5.1)
Sodium: 136 mmol/L (ref 135–145)

## 2024-06-25 LAB — MAGNESIUM: Magnesium: 2 mg/dL (ref 1.7–2.4)

## 2024-06-25 MED ORDER — POTASSIUM CHLORIDE CRYS ER 20 MEQ PO TBCR
EXTENDED_RELEASE_TABLET | ORAL | 3 refills | Status: DC
  Filled 2024-06-25: qty 300, 30d supply, fill #0
  Filled 2024-07-24: qty 300, 30d supply, fill #1

## 2024-06-25 NOTE — Discharge Instructions (Addendum)
 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 After 7 days, you may progress as below.  You should remove your sling 24 hours after your procedure, unless otherwise instructed by your provider.     Tuesday July 02, 2024  Wednesday July 03, 2024 Thursday July 04, 2024 Friday July 05, 2024   Do not lift, push, pull, or carry anything over 10 pounds with the affected arm until 6 weeks (Tuesday August 06, 2024 ) after your procedure.   You may drive AFTER your wound check, unless you have been told otherwise by your provider.   Ask your healthcare provider when you can go back to work   INCISION/Dressing HOLD Eliquis  post procedure. Ok to restart am of 06/30/24  If large square, outer bandage is left in place, this can be removed after 24 hours from your procedure. Do not remove steri-strips or glue as below.   Monitor your defibrillator site for redness, swelling, and drainage. Call the device clinic at [phone] if you experience these symptoms or fever/chills.  If your incision is sealed with Steri-strips or staples, you may shower 7 days after your procedure or when told by your provider. Do not remove the steri-strips or let the shower hit directly on your site. You may wash around your site with soap and water.    If you were discharged in a sling, please do not wear this during the day more than 48 hours after your surgery unless otherwise instructed. This may increase the risk of stiffness and soreness in your shoulder.   Avoid lotions, ointments, or perfumes over your incision until it is well-healed.  You may use a hot tub or a pool AFTER your wound check appointment if the incision is completely closed.  Your ICD is designed to protect you from life threatening heart rhythms. Because of this, you may receive a shock.   1 shock with no  symptoms:  Call the office during business hours. 1 shock with symptoms (chest pain, chest pressure, dizziness, lightheadedness, shortness of breath, overall feeling unwell):  Call 911. If you experience 2 or more shocks in 24 hours:  Call 911. If you receive a shock, you should not drive for 6 months per the [place] DMV IF you receive appropriate therapy from your ICD.   ICD Alerts:  Some alerts are vibratory and others beep. These are NOT emergencies. Please call our office to let us  know. If this occurs at night or on weekends, it can wait until the next business day. Send a remote transmission.  If your device is capable of reading fluid status (for heart failure), you will be offered monthly monitoring to review this with you.   DEVICE MANAGEMENT Remote monitoring is used to monitor your ICD from home. This monitoring is scheduled every 91 days by our office. It allows us  to keep an eye on the functioning of your device to ensure it is working properly. You will routinely see your Electrophysiologist annually (more often if necessary).   You should receive your ID card for your new device in 4-8 weeks. Keep this card with you at all times once received. Consider wearing a medical alert bracelet or necklace.  Your ICD  may be MRI compatible. This will be discussed at your next office visit/wound check.  You should avoid contact with strong electric or magnetic fields.  Do not use amateur (ham) radio equipment or electric (arc) welding torches. MP3 player headphones with magnets should not be used. Some devices are safe to use if held at least 12 inches (30 cm) from your defibrillator. These include power tools, lawn mowers, and speakers. If you are unsure if something is safe to use, ask your health care provider.  When using your cell phone, hold it to the ear that is on the opposite side from the defibrillator. Do not leave your cell phone in a pocket over the defibrillator.  You may safely use  electric blankets, heating pads, computers, and microwave ovens.  Call the office right away if: You have chest pain. You feel more than one shock. You feel more short of breath than you have felt before. You feel more light-headed than you have felt before. Your incision starts to open up.  This information is not intended to replace advice given to you by your health care provider. Make sure you discuss any questions you have with your health care provider.

## 2024-06-25 NOTE — Plan of Care (Signed)

## 2024-06-25 NOTE — Anesthesia Postprocedure Evaluation (Signed)
 Anesthesia Post Note  Patient: [name]  Procedure(s) Performed: BIV UPGRADE LEAD INSERTION     Patient location during evaluation: PACU Anesthesia Type: MAC Level of consciousness: awake and alert Pain management: pain level controlled Vital Signs Assessment: post-procedure vital signs reviewed and stable Respiratory status: spontaneous breathing, nonlabored ventilation, respiratory function stable and patient connected to nasal cannula oxygen Cardiovascular status: stable and blood pressure returned to baseline Postop Assessment: no apparent nausea or vomiting Anesthetic complications: no   No notable events documented.            Lynwood MARLA Cornea

## 2024-06-25 NOTE — Progress Notes (Signed)
 AVS education completed patient expressed understanding of plan of care.  PIV ad tele removed by Lauraine PEAK,    Discharge lounge called for transport and TOC meds are pending.

## 2024-06-25 NOTE — TOC CM/SW Note (Signed)
 Transition of Care [facility]) - Inpatient Brief Assessment   Patient Details  Name: Bradley Powell MRN: [id number] Date of Birth: [birth year]  Transition of Care [facility]) CM/SW Contact:    Waddell Barnie Rama, RN Phone Number: 06/25/2024, 12:36 PM   Clinical Narrative: From home alone, has PCP and insurance on file, states has no HH services in place at this time or DME at home.  States family member( mom)  will transport them home at [employer] and family is support system, states gets medications from [facility].  Pta self ambulatory.   There are no IP CM needs identified  at this time.  Please place consult for IP CM needs.     Transition of Care Asessment: Insurance and Status: Insurance coverage has been reviewed Patient has primary care physician: Yes Home environment has been reviewed: home alone Prior level of function:: indep Prior/Current Home Services: No current home services Social Drivers of Health Review: SDOH reviewed no interventions necessary Readmission risk has been reviewed: Yes Transition of care needs: no transition of care needs at this time

## 2024-06-26 ENCOUNTER — Telehealth: Payer: Self-pay

## 2024-06-26 NOTE — Telephone Encounter (Signed)
 Follow-up after same day discharge: Implant date: 06/24/2024 MD: CL Device: ICD BSX Location: L chest    Wound check visit: 07/09/2024 90 day MD follow-up: 09/27/2024  Remote Transmission received:not yet pt needs to reset monitor   Dressing/sling removed: yes  Confirm OAC restart on: yes   Please continue to monitor your cardiac device site for redness, swelling, and drainage. Call the device clinic at [phone] if you experience these symptoms, fever/chills, or have questions about your device.   Remote monitoring is used to monitor your cardiac device from home. This monitoring is scheduled every 91 days by our office. It allows us  to keep an eye on the functioning of your device to ensure it is working properly.

## 2024-06-27 ENCOUNTER — Other Ambulatory Visit: Payer: Self-pay

## 2024-07-03 ENCOUNTER — Telehealth (HOSPITAL_COMMUNITY): Payer: Self-pay | Admitting: Vascular Surgery

## 2024-07-03 ENCOUNTER — Encounter: Payer: Self-pay | Admitting: Cardiology

## 2024-07-03 NOTE — Telephone Encounter (Signed)
 LVM to move 9/18 appt to 9/17

## 2024-07-04 DIAGNOSIS — I272 Pulmonary hypertension, unspecified: Secondary | ICD-10-CM | POA: Diagnosis not present

## 2024-07-04 DIAGNOSIS — I429 Cardiomyopathy, unspecified: Secondary | ICD-10-CM | POA: Diagnosis not present

## 2024-07-04 DIAGNOSIS — I480 Paroxysmal atrial fibrillation: Secondary | ICD-10-CM | POA: Diagnosis not present

## 2024-07-08 ENCOUNTER — Other Ambulatory Visit: Payer: Self-pay

## 2024-07-08 ENCOUNTER — Encounter (HOSPITAL_COMMUNITY): Payer: Self-pay | Admitting: Internal Medicine

## 2024-07-08 MED ORDER — AMIODARONE HCL 200 MG PO TABS
200.0000 mg | ORAL_TABLET | Freq: Two times a day (BID) | ORAL | 2 refills | Status: DC
  Filled 2024-07-08: qty 180, 90d supply, fill #0
  Filled 2024-07-09: qty 60, 30d supply, fill #0
  Filled 2024-07-18: qty 60, 30d supply, fill #1

## 2024-07-09 ENCOUNTER — Other Ambulatory Visit: Payer: Self-pay

## 2024-07-09 ENCOUNTER — Other Ambulatory Visit: Payer: Self-pay | Admitting: Cardiology

## 2024-07-09 ENCOUNTER — Ambulatory Visit: Attending: Cardiology

## 2024-07-09 DIAGNOSIS — I5022 Chronic systolic (congestive) heart failure: Secondary | ICD-10-CM

## 2024-07-09 DIAGNOSIS — I428 Other cardiomyopathies: Secondary | ICD-10-CM

## 2024-07-09 DIAGNOSIS — T82110A Breakdown (mechanical) of cardiac electrode, initial encounter: Secondary | ICD-10-CM

## 2024-07-09 MED ORDER — METOLAZONE 5 MG PO TABS
5.0000 mg | ORAL_TABLET | ORAL | 0 refills | Status: DC
  Filled 2024-07-09: qty 4, 28d supply, fill #0
  Filled 2024-07-18: qty 4, 28d supply, fill #1

## 2024-07-09 NOTE — Patient Instructions (Addendum)
 You will be scheduled for a chest Xray tomorrow 07/10/2024.  I will call you to confirm the time.  After Your ICD (Implantable Cardiac Defibrillator)    Monitor your defibrillator site for redness, swelling, and drainage. Call the device clinic at [phone] if you experience these symptoms or fever/chills.  Your incision was closed with Steri-strips or staples:  You may shower 7 days after your procedure and wash your incision with soap and water. Avoid lotions, ointments, or perfumes over your incision until it is well-healed.  You may use a hot tub or a pool after your wound check appointment if the incision is completely closed.  Do not lift, push or pull greater than 10 pounds with the affected arm until 6 weeks after your procedure. There are no other restrictions in arm movement after your wound check appointment.  Your ICD is designed to protect you from life threatening heart rhythms. Because of this, you may receive a shock.   1 shock with no symptoms:  Call the office during business hours. 1 shock with symptoms (chest pain, chest pressure, dizziness, lightheadedness, shortness of breath, overall feeling unwell):  Call 911. If you experience 2 or more shocks in 24 hours:  Call 911. If you receive a shock, you should not drive.  [place] DMV - no driving for 6 months if you receive appropriate therapy from your ICD.   ICD Alerts:  Some alerts are vibratory and others beep. These are NOT emergencies. Please call our office to let us  know. If this occurs at night or on weekends, it can wait until the next business day. Send a remote transmission.  If your device is capable of reading fluid status (for heart failure), you will be offered monthly monitoring to review this with you.   Remote monitoring is used to monitor your ICD from home. This monitoring is scheduled every 91 days by our office. It allows us  to keep an eye on the functioning of your device to ensure it is working  properly. You will routinely see your Electrophysiologist annually (more often if necessary).

## 2024-07-10 ENCOUNTER — Other Ambulatory Visit: Payer: Self-pay

## 2024-07-10 ENCOUNTER — Ambulatory Visit (HOSPITAL_COMMUNITY)
Admission: RE | Admit: 2024-07-10 | Discharge: 2024-07-10 | Disposition: A | Discharge status: Home / Self Care | Source: Ambulatory Visit | Attending: Cardiology | Admitting: Cardiology

## 2024-07-10 DIAGNOSIS — T82110A Breakdown (mechanical) of cardiac electrode, initial encounter: Secondary | ICD-10-CM | POA: Diagnosis not present

## 2024-07-10 DIAGNOSIS — I5022 Chronic systolic (congestive) heart failure: Secondary | ICD-10-CM | POA: Insufficient documentation

## 2024-07-10 DIAGNOSIS — Z452 Encounter for adjustment and management of vascular access device: Secondary | ICD-10-CM | POA: Diagnosis not present

## 2024-07-10 NOTE — Progress Notes (Signed)
 Normal multi chamber ICD wound check. Wound well healed. Presenting rhythm: AS/BP 77 . Routine testing performed. Thresholds, sensing, and impedance consistent with implant measurements for RA & RV lead. No treated arrhythmias. Reviewed arm restrictions to continue for 6 weeks total post op. Reviewed shock plan.  Pt enrolled in remote follow-up.  Patient LV threshold initially tested at 4V @ 1.37ms programmed vector LVRing2-->Can. Outreach made to [employer] w/ [employer] to assist w/ troubleshooting. Following vectors tested below.   LV Tests: - LVTip1-->Can - Test failed - LVRing3-->Can - Test failed - LVTip1-->LVRing2 - Test failed - LVRing2-->LVRing3 - Test failed  Best vector LVRing2-->RV which tested 4V @ 1.36ms & 3.4V @ 1.91ms.   Patient programmed LVRing2-->RV 4.5V @ 1.38ms. EKG performed & will obtain CXR.   Note: RV lead tested 1.5V @ 0.63ms. Would consider pulling out pulse width at next visit.

## 2024-07-11 ENCOUNTER — Telehealth: Payer: Self-pay

## 2024-07-11 NOTE — Telephone Encounter (Signed)
 Outreach made to Pt.  Advised Dr. Cindie had reviewed chest xray and no lead position change noted.  Dr. Cindie also reviewed EKG.  Advised Pt that Dr. Cindie was happy with position of leads and ok with vector change.    Advised Pt no further action needed.  Advised Pt to continue to monitor incision site.  He had complained of some possible stim at pocket site during wound check.  He will continue to monitor and advise if he notices anymore.  All questions answered.  91 day follow up scheduled.

## 2024-07-17 ENCOUNTER — Other Ambulatory Visit: Payer: Self-pay

## 2024-07-17 DIAGNOSIS — K7460 Unspecified cirrhosis of liver: Secondary | ICD-10-CM | POA: Diagnosis not present

## 2024-07-17 DIAGNOSIS — R945 Abnormal results of liver function studies: Secondary | ICD-10-CM | POA: Diagnosis not present

## 2024-07-17 DIAGNOSIS — K259 Gastric ulcer, unspecified as acute or chronic, without hemorrhage or perforation: Secondary | ICD-10-CM | POA: Diagnosis not present

## 2024-07-17 DIAGNOSIS — R112 Nausea with vomiting, unspecified: Secondary | ICD-10-CM | POA: Diagnosis not present

## 2024-07-17 DIAGNOSIS — I4891 Unspecified atrial fibrillation: Secondary | ICD-10-CM | POA: Diagnosis not present

## 2024-07-17 DIAGNOSIS — I509 Heart failure, unspecified: Secondary | ICD-10-CM | POA: Diagnosis not present

## 2024-07-17 MED ORDER — ESOMEPRAZOLE MAGNESIUM 40 MG PO CPDR
40.0000 mg | DELAYED_RELEASE_CAPSULE | Freq: Two times a day (BID) | ORAL | 3 refills | Status: DC
  Filled 2024-07-17: qty 60, 30d supply, fill #0

## 2024-07-18 ENCOUNTER — Other Ambulatory Visit: Payer: Self-pay

## 2024-07-18 DIAGNOSIS — K7460 Unspecified cirrhosis of liver: Secondary | ICD-10-CM | POA: Diagnosis not present

## 2024-07-19 ENCOUNTER — Other Ambulatory Visit (HOSPITAL_COMMUNITY): Payer: Self-pay | Admitting: Internal Medicine

## 2024-07-19 ENCOUNTER — Encounter: Payer: Self-pay | Admitting: Cardiology

## 2024-07-19 ENCOUNTER — Other Ambulatory Visit: Payer: Self-pay

## 2024-07-19 ENCOUNTER — Encounter (HOSPITAL_COMMUNITY): Payer: Self-pay | Admitting: Internal Medicine

## 2024-07-19 DIAGNOSIS — I5022 Chronic systolic (congestive) heart failure: Secondary | ICD-10-CM

## 2024-07-19 NOTE — Telephone Encounter (Signed)
 I am glad he is feeling better today. As long as there are no signs/symptoms of bleeding (since on Eliquis ) and no symptoms of hypotension, I dont think he needs to change anything and just resume his medications as he normally takes them. If he is still concerned, it would not be unreasonable to repeat labs, could get a BMET and CBC to check.  He sees Dr. Bensimhon on 07/31/24, but he can let us  know before that if he starts to feel poorly.

## 2024-07-22 ENCOUNTER — Other Ambulatory Visit (HOSPITAL_COMMUNITY): Payer: Self-pay

## 2024-07-22 ENCOUNTER — Other Ambulatory Visit: Payer: Self-pay

## 2024-07-24 ENCOUNTER — Other Ambulatory Visit: Payer: Self-pay

## 2024-07-24 ENCOUNTER — Emergency Department
Admission: EM | Admit: 2024-07-24 | Discharge: 2024-07-24 | Disposition: A | Discharge status: Home / Self Care | Attending: Emergency Medicine | Admitting: Emergency Medicine

## 2024-07-24 ENCOUNTER — Encounter: Payer: Self-pay | Admitting: Emergency Medicine

## 2024-07-24 DIAGNOSIS — N501 Vascular disorders of male genital organs: Secondary | ICD-10-CM | POA: Diagnosis not present

## 2024-07-24 DIAGNOSIS — N183 Chronic kidney disease, stage 3 unspecified: Secondary | ICD-10-CM | POA: Diagnosis not present

## 2024-07-24 DIAGNOSIS — R58 Hemorrhage, not elsewhere classified: Secondary | ICD-10-CM | POA: Diagnosis not present

## 2024-07-24 DIAGNOSIS — Z7901 Long term (current) use of anticoagulants: Secondary | ICD-10-CM | POA: Insufficient documentation

## 2024-07-24 DIAGNOSIS — I509 Heart failure, unspecified: Secondary | ICD-10-CM | POA: Insufficient documentation

## 2024-07-24 MED ORDER — LIDOCAINE-EPINEPHRINE-TETRACAINE (LET) TOPICAL GEL
3.0000 mL | Freq: Once | TOPICAL | Status: DC
  Filled 2024-07-24: qty 3

## 2024-07-24 NOTE — Discharge Instructions (Signed)
 Apply ice over clothing in the area of the scrotum intermittently throughout the day today to help prevent rebleeding.  Avoid scratching or rubbing over the adhesive.  If this problem becomes recurrent, please follow-up with your primary care provider.  If bleeding returns and you are unable to get to stop in ways that we discussed, please return to the emergency department.

## 2024-07-24 NOTE — ED Provider Notes (Signed)
   [facility] Provider Note    Event Date/Time   First MD Initiated Contact with Patient 07/24/24 [phone]     (approximate)   History   Scrotal Bleeding   HPI  Bradley Powell is a 43 y.o. male with history of CHF, atrial flutter/fibrillation on Eliquis , IDA, CKD stage III and as listed in EMR presents to the emergency department for treatment and evaluation of bleeding from the scrotum.  This happened to him on Monday as well but he was able to get the bleeding to stop.  Today when he got up to go to the bathroom blood started running down his leg and he noticed that the area was bleeding again.  It bled for approximately 45 minutes before calling EMS.  Bleeding now well-controlled.     Physical Exam    Vitals:   07/24/24 0718  BP: (!) 107/55  Pulse: 70  Resp: 17  Temp: (!) 97.5 F (36.4 C)  SpO2: 100%    General: Awake, no distress.  CV:  Good peripheral perfusion.  Resp:  Normal effort.  Abd:  No distention.  Other:  Pinpoint scabbed area over superficial vein noted on the right side of scrotum that appears to have been the source of bleeding.  No active bleeding currently.   ED Results / Procedures / Treatments   Labs (all labs ordered are listed, but only abnormal results are displayed)  Labs Reviewed - No data to display   EKG  Not indicated   RADIOLOGY  Image and radiology report reviewed and interpreted by me. Radiology report consistent with the same.  Not indicated  PROCEDURES: Dermabond applied over scabbed area on right side of scrotum.  Critical Care performed: No  Procedures   MEDICATIONS ORDERED IN ED:  Medications  lidocaine -EPINEPHrine -tetracaine  (LET) topical gel (3 mLs Topical Not Given 07/24/24 0925)     IMPRESSION / MDM / ASSESSMENT AND PLAN / ED COURSE   I have reviewed the triage note and vital signs. Vital signs are stable   Differential diagnosis includes, but is not limited to, venous  bleeding, abrasion, laceration, fissure  Patient's presentation is most consistent with acute illness / injury with system symptoms.  43 year old male presenting to the emergency department for treatment and evaluation after about 45 minutes of bleeding from the right side of the scrotum.  See HPI for further details.  On exam, the area is no longer actively bleeding.  There is a scab over a superficial venous structure.  Dermabond applied over this area.  Patient encouraged to apply pressure to the area if bleeding starts again. ER return precautions discussed.  Patient discharged home in stable condition.      FINAL CLINICAL IMPRESSION(S) / ED DIAGNOSES   Final diagnoses:  Scrotal bleeding     Rx / DC Orders   ED Discharge Orders     None        Note:  This document was prepared using Dragon voice recognition software and may include unintentional dictation errors.   Herlinda Kirk NOVAK, FNP 07/24/24 1349    Suzanne Kirsch, MD 07/24/24 1542

## 2024-07-24 NOTE — ED Triage Notes (Signed)
 First Nurse Note: Patient to ED via [facility] from home for bleeding from scrotum. On blood thinners. Noticed it this AM upon waking. States this occurred on Monday but was able to get it to stop. Dressing placed by EMS. Bleeding controlled at this time. Hx CHF, pacemaker  114/72 98% RA 99 cbg 86 HR

## 2024-07-26 ENCOUNTER — Encounter: Payer: Self-pay | Admitting: Cardiology

## 2024-07-26 ENCOUNTER — Encounter (HOSPITAL_COMMUNITY): Payer: Self-pay | Admitting: Internal Medicine

## 2024-07-31 ENCOUNTER — Encounter (HOSPITAL_COMMUNITY): Admitting: Internal Medicine

## 2024-08-01 ENCOUNTER — Encounter (HOSPITAL_COMMUNITY): Admitting: Internal Medicine

## 2024-08-01 ENCOUNTER — Encounter (HOSPITAL_COMMUNITY): Payer: Self-pay | Admitting: Internal Medicine

## 2024-08-01 DIAGNOSIS — R946 Abnormal results of thyroid function studies: Secondary | ICD-10-CM | POA: Diagnosis not present

## 2024-08-05 ENCOUNTER — Other Ambulatory Visit: Payer: Self-pay

## 2024-08-05 ENCOUNTER — Inpatient Hospital Stay (HOSPITAL_COMMUNITY): Admission: RE | Admit: 2024-08-05 | Source: Ambulatory Visit

## 2024-08-05 ENCOUNTER — Emergency Department (HOSPITAL_COMMUNITY)
Admission: EM | Admit: 2024-08-05 | Discharge: 2024-08-14 | Disposition: E | Attending: Emergency Medicine | Admitting: Emergency Medicine

## 2024-08-05 ENCOUNTER — Encounter (HOSPITAL_COMMUNITY): Payer: Self-pay

## 2024-08-05 DIAGNOSIS — I213 ST elevation (STEMI) myocardial infarction of unspecified site: Secondary | ICD-10-CM | POA: Diagnosis not present

## 2024-08-05 DIAGNOSIS — I499 Cardiac arrhythmia, unspecified: Secondary | ICD-10-CM | POA: Insufficient documentation

## 2024-08-05 DIAGNOSIS — E871 Hypo-osmolality and hyponatremia: Secondary | ICD-10-CM | POA: Diagnosis not present

## 2024-08-05 DIAGNOSIS — Z950 Presence of cardiac pacemaker: Secondary | ICD-10-CM | POA: Insufficient documentation

## 2024-08-05 DIAGNOSIS — I5042 Chronic combined systolic (congestive) and diastolic (congestive) heart failure: Secondary | ICD-10-CM | POA: Diagnosis not present

## 2024-08-05 DIAGNOSIS — I469 Cardiac arrest, cause unspecified: Secondary | ICD-10-CM | POA: Diagnosis not present

## 2024-08-05 DIAGNOSIS — R0602 Shortness of breath: Secondary | ICD-10-CM | POA: Diagnosis not present

## 2024-08-05 DIAGNOSIS — E875 Hyperkalemia: Secondary | ICD-10-CM | POA: Diagnosis not present

## 2024-08-05 DIAGNOSIS — J9601 Acute respiratory failure with hypoxia: Secondary | ICD-10-CM | POA: Diagnosis not present

## 2024-08-05 DIAGNOSIS — R140 Abdominal distension (gaseous): Secondary | ICD-10-CM | POA: Diagnosis not present

## 2024-08-05 DIAGNOSIS — E039 Hypothyroidism, unspecified: Secondary | ICD-10-CM | POA: Insufficient documentation

## 2024-08-05 DIAGNOSIS — R7309 Other abnormal glucose: Secondary | ICD-10-CM | POA: Insufficient documentation

## 2024-08-05 DIAGNOSIS — Z743 Need for continuous supervision: Secondary | ICD-10-CM | POA: Diagnosis not present

## 2024-08-05 DIAGNOSIS — N183 Chronic kidney disease, stage 3 unspecified: Secondary | ICD-10-CM | POA: Insufficient documentation

## 2024-08-05 LAB — I-STAT CHEM 8, ED
BUN: 82 mg/dL — ABNORMAL HIGH (ref 6–20)
Calcium, Ion: 1.03 mmol/L — ABNORMAL LOW (ref 1.15–1.40)
Chloride: 108 mmol/L (ref 98–111)
Creatinine, Ser: 5.3 mg/dL — ABNORMAL HIGH (ref 0.61–1.24)
Glucose, Bld: 170 mg/dL — ABNORMAL HIGH (ref 70–99)
HCT: 39 % (ref 39.0–52.0)
Hemoglobin: 13.3 g/dL (ref 13.0–17.0)
Potassium: 6.4 mmol/L (ref 3.5–5.1)
Sodium: 122 mmol/L — ABNORMAL LOW (ref 135–145)
TCO2: 14 mmol/L — ABNORMAL LOW (ref 22–32)

## 2024-08-05 MED ORDER — CALCIUM CHLORIDE 10 % IV SOLN
INTRAVENOUS | Status: AC | PRN
  Administered 2024-08-05: 1 g via INTRAVENOUS

## 2024-08-05 MED ORDER — MAGNESIUM SULFATE 50 % IJ SOLN
INTRAMUSCULAR | Status: AC | PRN
  Administered 2024-08-05: 2 g via INTRAVENOUS

## 2024-08-05 MED ORDER — ETOMIDATE 2 MG/ML IV SOLN
INTRAVENOUS | Status: AC | PRN
  Administered 2024-08-05: 20 mg via INTRAVENOUS

## 2024-08-05 MED ORDER — SODIUM BICARBONATE 8.4 % IV SOLN
INTRAVENOUS | Status: AC | PRN
Start: 2024-08-05 — End: 2024-08-05
  Administered 2024-08-05: 50 meq via INTRAVENOUS

## 2024-08-05 MED ORDER — NOREPINEPHRINE 4 MG/250ML-% IV SOLN
INTRAVENOUS | Status: AC
  Filled 2024-08-05: qty 250

## 2024-08-05 MED ORDER — SUCCINYLCHOLINE CHLORIDE 200 MG/10ML IV SOSY
PREFILLED_SYRINGE | INTRAVENOUS | Status: AC
  Filled 2024-08-05: qty 10

## 2024-08-05 MED ORDER — ROCURONIUM BROMIDE 10 MG/ML (PF) SYRINGE
PREFILLED_SYRINGE | INTRAVENOUS | Status: AC
  Filled 2024-08-05: qty 10

## 2024-08-05 MED ORDER — EPINEPHRINE 1 MG/10ML IV SOSY
PREFILLED_SYRINGE | INTRAVENOUS | Status: AC | PRN
  Administered 2024-08-05 (×9): 1 mg via INTRAVENOUS

## 2024-08-05 MED ORDER — AMIODARONE IV BOLUS ONLY 150 MG/100ML
INTRAVENOUS | Status: AC | PRN
Start: 2024-08-05 — End: 2024-08-05
  Administered 2024-08-05: 300 mg via INTRAVENOUS

## 2024-08-05 MED ORDER — ETOMIDATE 2 MG/ML IV SOLN
INTRAVENOUS | Status: AC
  Filled 2024-08-05: qty 20

## 2024-08-05 MED ORDER — ATROPINE SULFATE 1 MG/10ML IJ SOSY
PREFILLED_SYRINGE | INTRAMUSCULAR | Status: AC | PRN
  Administered 2024-08-05: 1 mg via INTRAVENOUS

## 2024-08-06 ENCOUNTER — Encounter

## 2024-08-14 NOTE — Code Documentation (Signed)
 Patient in asystole, pulses lost, CPR started

## 2024-08-14 NOTE — ED Provider Notes (Signed)
 Emergency Department Provider Note   I have reviewed the triage vital signs and the nursing notes.   HISTORY  Chief Complaint Respiratory Arrest   HPI Bradley Powell is a 43 y.o. male with multimedical problems to include chronic kidney disease, cirrhosis, heart failure who presents to the ER today secondary to weakness and shortness of breath.  History obtained from EMS and family as patient was unresponsive on arrival.  Family states that he had been swelling up for couple days but then today started get short of breath and had multiple episodes of syncope and just generalized weakness which concerned them so they called EMS.  On EMSs arrival the patient was awake and alert and talking but was dyspneic.  When they got him into the truck and started coming here he had a couple episodes of syncope that were associated with what looks like possible V-fib but his rhythm was pretty wide on my interpretation.  Has a pacemaker defibrillator installed.  By the time they arrived here the patient was unresponsive.  Past Medical History:  Diagnosis Date   Atrial flutter (HCC)    a. s/p ablation 03/2018.   Chronic combined systolic and diastolic CHF (congestive heart failure) (HCC)    History of esophagogastroduodenoscopy (EGD)    Morbid obesity (HCC)    NICM (nonischemic cardiomyopathy) (HCC)    OSA on CPAP    mild OSA with an AHI of 7.7/hr on auto CPAP   PVC's (premature ventricular contractions)     Patient Active Problem List   Diagnosis Date Noted   Acute hypokalemia 03/24/2024   AICD discharge 03/21/2024   Elevated troponin 03/21/2024   CKD (chronic kidney disease), stage III (HCC) 03/21/2024   VT (ventricular tachycardia) (HCC) 03/14/2024   Syncope 02/17/2024   Hypokalemia 11/21/2023   Acute kidney injury superimposed on chronic kidney disease (HCC) 10/13/2023   Leukocytosis 10/13/2023   Hypothyroidism 10/13/2023   Iron deficiency 10/13/2023   Acute on chronic congestive  heart failure (HCC) 09/30/2023   Fluid overload 09/29/2023   Ventricular tachycardia (HCC) 05/26/2022   ICD (implantable cardioverter-defibrillator) in place 05/26/2022   Syncope and collapse 02/15/2022   Acute on chronic systolic (congestive) heart failure (HCC) 02/15/2022   Hepatic cirrhosis (HCC) 06/16/2020   Dehydration with hyponatremia 06/16/2020   Hypotension due to hypovolemia 06/16/2020   Intractable nausea and vomiting 06/16/2020   Acute kidney injury (HCC) 06/15/2020   Atrial fibrillation (HCC) 04/29/2020   Chronic combined systolic and diastolic congestive heart failure (HCC) 04/07/2018   NICM (nonischemic cardiomyopathy) (HCC) 04/07/2018   Hyperglycemia 04/07/2018   Typical atrial flutter (HCC) 04/06/2018   Paroxysmal atrial flutter (HCC) 04/06/2018   Gout 03/30/2017   Suspected sleep apnea 03/01/2017   Edema 02/24/2017   Testicular swelling 02/24/2017   CHF (congestive heart failure) (HCC) 02/24/2017   Overweight 02/24/2017   Costochondritis 02/24/2017   Sinus tachycardia 02/24/2017   First degree AV block 02/24/2017   Scrotal edema 02/24/2017   Obesity, class 2 02/24/2017    Past Surgical History:  Procedure Laterality Date   A-FLUTTER ABLATION N/A 04/06/2018   Procedure: A-FLUTTER ABLATION;  Surgeon: Waddell Danelle ORN, MD;  Location: [facility];  Service: Cardiovascular;  Laterality: N/A;   BIOPSY  07/11/2023   Procedure: BIOPSY;  Surgeon: Elicia Claw, MD;  Location: [facility] ENDOS[facility];  Service: Gastroenterology;;   JUVENTINO LLANO N/A 06/24/2024   Procedure: BIV LLANO;  Surgeon: Cindie Ole DASEN, MD;  Location: [facility];  Service:  Cardiovascular;  Laterality: N/A;   ESOPHAGEAL BRUSHING  06/19/2020   Procedure: ESOPHAGEAL BRUSHING;  Surgeon: Dianna Specking, MD;  Location: [facility] ENDOSCOPY;  Service: Endoscopy;;   ESOPHAGOGASTRODUODENOSCOPY (EGD) WITH PROPOFOL  Left 06/19/2020   Procedure: ESOPHAGOGASTRODUODENOSCOPY (EGD) WITH PROPOFOL ;  Surgeon:  Dianna Specking, MD;  Location: [facility] ENDOSCOPY;  Service: Endoscopy;  Laterality: Left;   ESOPHAGOGASTRODUODENOSCOPY (EGD) WITH PROPOFOL  N/A 07/11/2023   Procedure: ESOPHAGOGASTRODUODENOSCOPY (EGD) WITH PROPOFOL ;  Surgeon: Elicia Claw, MD;  Location: [facility] ENDOSCOPY;  Service: Gastroenterology;  Laterality: N/A;   ICD IMPLANT N/A 02/22/2022   Procedure: ICD IMPLANT;  Surgeon: Waddell Danelle ORN, MD;  Location: [facility] CV LAB;  Service: Cardiovascular;  Laterality: N/A;   LEAD INSERTION N/A 06/24/2024   Procedure: LEAD INSERTION;  Surgeon: Cindie Ole DASEN, MD;  Location: [facility] INVASIVE CV LAB;  Service: Cardiovascular;  Laterality: N/A;   RIGHT HEART CATH N/A 12/20/2019   Procedure: RIGHT HEART CATH;  Surgeon: Cherrie Toribio SAUNDERS, MD;  Location: [facility];  Service: Cardiovascular;  Laterality: N/A;   RIGHT/LEFT HEART CATH AND CORONARY ANGIOGRAPHY N/A 03/01/2017   Procedure: Right/Left Heart Cath and Coronary Angiography;  Surgeon: Toribio SAUNDERS Cherrie, MD;  Location: [facility] INVASIVE CV LAB;  Service: Cardiovascular;  Laterality: N/A;    Current Outpatient Rx   Order #: [id number] Class: Historical Med   Order #: [id number] Class: No Print   Order #: [id number] Class: Normal   Order #: [id number] Class: Normal   Order #: [id number] Class: Normal   Order #: [id number] Class: Normal   Order #: [id number] Class: Historical Med   Order #: [id number] Class: Normal   Order #: [id number] Class: Normal   Order #: [id number] Class: Normal   Order #: [id number] Class: Normal   Order #: [id number] Class: Normal   Order #: [id number] Class: Normal   Order #: [id number] Class: Historical Med   Order #: [id number] Class: Normal   Order #: [id number] Class: Normal   Order #: [id number] Class: Normal   Order #: [id number] Class: Normal   Order #: [id number] Class: Normal   Order #: [id number] Class: Normal   Order #: [id number] Class: Normal   Order #: [id number] Class: Normal    Allergies Entresto  [sacubitril -valsartan ]  Family  History  Problem Relation Age of Onset   CVA Mother        pacemaker   Multiple sclerosis Mother    Seizures Mother    Hypertension Father    Gout Father     Social History Social History   Tobacco Use   Smoking status: Never   Smokeless tobacco: Never  Vaping Use   Vaping status: Never Used  Substance Use Topics   Alcohol use: Yes    Comment: rarely   Drug use: No    Review of Systems  Not able to assess ____________________________________________  PHYSICAL EXAM:  VITAL SIGNS: ED Triage Vitals  Encounter Vitals Group     BP August 22, 2024 0339 (!) 120/95     Girls Systolic BP Percentile --      Girls Diastolic BP Percentile --      Boys Systolic BP Percentile --      Boys Diastolic BP Percentile --      Pulse Rate 2024/08/22 0338 (!) 56     Resp 22-Aug-2024 0337 16     Temp --      Temp src --      SpO2 2024/08/22 0338 (!) 64 %     Weight --      Height --      Head Circumference --      Peak Flow --  Pain Score --      Pain Loc --      Pain Education --      Exclude from Growth Chart --     Constitutional: Well appearing. Well nourished. Eyes: Conjunctivae are injected and dry. Fixed Pupils.  Head: Atraumatic. Nose: No congestion/rhinnorhea. Mouth/Throat: Mucous membranes are dry, frothy sputum in oropharynx.  Oropharynx non-erythematous. Neck: No deformity Cardiovascular: bradycardic rate, regular rhythm.  Respiratory: No respiratory effort.  Lungs silent Gastrointestinal: . No distention. Has body wall edema Musculoskeletal: No lower extremity tenderness but has significant BLE edema. No gross deformities of extremities. Neurologic:  Not able to assess 2/2 condition.  Skin:   No rash noted. Psych: Not able to assess 2/2 condition.   ____________________________________________   LABS (all labs ordered are listed, but only abnormal results are displayed)  Labs Reviewed  I-STAT CHEM 8, ED - Abnormal; Notable for the following components:       Result Value   Sodium 122 (*)    Potassium 6.4 (*)    BUN 82 (*)    Creatinine, Ser 5.30 (*)    Glucose, Bld 170 (*)    Calcium , Ion 1.03 (*)    TCO2 14 (*)    All other components within normal limits   ____________________________________________  EKG   EKG Interpretation Date/Time:    Ventricular Rate:    PR Interval:    QRS Duration:    QT Interval:    QTC Calculation:   R Axis:      Text Interpretation:          ____________________________________________  RADIOLOGY  No results found. ____________________________________________   PROCEDURES  Procedure(s) performed:   .Critical Care  Performed by: Lorette Mayo, MD Authorized by: Lorette Mayo, MD   Critical care provider statement:    Critical care time (minutes):  35   Critical care was necessary to treat or prevent imminent or life-threatening deterioration of the following conditions:  Respiratory failure, cardiac failure, circulatory failure, CNS failure or compromise, shock and metabolic crisis   Critical care was time spent personally by me on the following activities:  Development of treatment plan with patient or surrogate, discussions with consultants, evaluation of patient's response to treatment, examination of patient, ordering and review of laboratory studies, ordering and review of radiographic studies, ordering and performing treatments and interventions, pulse oximetry, re-evaluation of patient's condition and review of old charts CPR  Date/Time: 2024-08-28 5:08 AM  Performed by: Lorette Mayo, MD Authorized by: Lorette Mayo, MD  CPR Procedure Details:      Amount of time prior to administration of ACLS/BLS (minutes):  2   ACLS/BLS initiated by EMS: No     CPR/ACLS performed in the ED: Yes     Duration of CPR (minutes):  40   Outcome: Pt declared dead    CPR performed via ACLS guidelines under my direct supervision.  See RN documentation for details including defibrillator use,  medications, doses and timing. Procedure Name: Intubation Date/Time: 08-28-2024 5:08 AM  Performed by: Lorette Mayo, MDPre-anesthesia Checklist: Patient identified, Patient being monitored, Emergency Drugs available, Timeout performed and Suction available Oxygen Delivery Method: Non-rebreather mask Preoxygenation: Pre-oxygenation with 100% oxygen Induction Type: Rapid sequence Ventilation: Mask ventilation without difficulty Laryngoscope Size: Glidescope Grade View: Grade I Tube size: 7.5 mm Number of attempts: 1 Airway Equipment and Method: Rigid stylet Placement Confirmation: ETT inserted through vocal cords under direct vision, CO2 detector and Breath sounds checked- equal and bilateral Secured at: 25 cm  Tube secured with: ETT holder Dental Injury: Teeth and Oropharynx as per pre-operative assessment  Future Recommendations: Recommend- induction with short-acting agent, and alternative techniques readily available    IO LINE INSERTION  Date/Time: 08-10-2024 5:09 AM  Performed by: Lorette Mayo, MD Authorized by: Lorette Mayo, MD   Consent:    Consent obtained:  Emergent situation Universal protocol:    Patient identity confirmed:  Arm band and provided demographic data Pre-procedure details:    Site preparation:  Alcohol   Preparation: Patient was prepped and draped in usual sterile fashion   Anesthesia:    Anesthesia method:  None Procedure details:    Insertion site:  R proximal tibia   Insertion device:  Drill device   Insertion: Needle was inserted through the bony cortex     Number of attempts:  1   Insertion confirmation:  Aspiration of blood/marrow, easy infusion of fluids and stability of the needle Post-procedure details:    Secured with:  Transparent dressing and protective shield   Procedure completion:  Tolerated well, no immediate complications  ____________________________________________   INITIAL IMPRESSION / ASSESSMENT AND PLAN / ED  COURSE  Pertinent labs & imaging results that were available during my care of the patient were reviewed by me and considered in my medical decision making (see chart for details).   Patient being bagged on arrival.  Unresponsive.  After moving over the bed he was noted to be bradycardic.  A dose of atropine  was given.  He had weak carotid pulses.  His feet were cold and his hands were cold.  Concerning for possible cardiogenic shock versus anaphylaxis versus PE versus ACS. as Levophed  was being prepared patient lost pulses and CPR was initiated.  See the code document for full details.  In short patient remained in PEA throughout all of CPR he was given multiple rounds of epinephrine , magnesium , amiodarone , calcium , bicarb.  IO was placed for access and multiple IVs.  Multiple bedside ultrasounds done without any cardiac activity.  About halfway through the code EMS showed me their strips and it did look like it was consistent with possible severe hyperkalemia and thus those medications.  Ultimately we did get a blood gas that showed a potassium of 6.4, hyponatremia, hyperglycemia, acute worsening of his chronic kidney disease as well.  After approximately 40 minutes of high-quality CPR, multiple rounds of epinephrine  and no change in his rhythm or cardiac activity the family was brought to the room and the patient was declared dead at 0 415.  I suspect the cause to be arrhythmia from fluid overload/heart failure, related to pulmonary edema exacerbated by hyperkalemia and kidney failure.  Spoke with ME, Rolan Bowman who declined it as an autopsy case 2/2 extensive cardiac history and the presentation c/w same.   If PCP or cardiologist want to fill out death certificate that would be helpful, if not I can do it with my limited information.   ____________________________________________  FINAL CLINICAL IMPRESSION(S) / ED DIAGNOSES  Final diagnoses:  Hyponatremia  Hyperkalemia  Acute respiratory failure  with hypoxia (HCC)  Cardiac arrhythmia, unspecified cardiac arrhythmia type  Cardiac arrest (HCC)    MEDICATIONS GIVEN DURING THIS VISIT:  Medications  norepinephrine  (LEVOPHED ) 4-5 MG/250ML-% infusion SOLN (has no administration in time range)  EPINEPHrine  (ADRENALIN ) 1 MG/10ML injection (1 mg Intravenous Given August 10, 2024 0409)  etomidate  (AMIDATE ) injection (20 mg Intravenous Given 2024-08-10 0342)  atropine  1 MG/10ML injection (1 mg Intravenous Given 08/10/2024 0337)  sodium bicarbonate  injection (50 mEq  Intravenous Given 2024/08/25 0340)  calcium  chloride injection (1 g Intravenous Given 2024/08/25 0348)  amiodarone  (NEXTERONE ) 1.5 mg/mL IV bolus only (300 mg Intravenous New Bag/Given 08-25-2024 0349)  magnesium  sulfate (IV Push/IM) injection (2 g Intravenous Given 2024-08-25 0350)    NEW OUTPATIENT MEDICATIONS STARTED DURING THIS VISIT:  New Prescriptions   No medications on file    Note:  This document was prepared using Dragon voice recognition software and may include unintentional dictation errors.     Najwa Spillane, Selinda, MD 2024-08-25 [phone]

## 2024-08-14 NOTE — Progress Notes (Signed)
 Chaplain responded to unit call requesting chaplain presence.   Upon arrival, pt Bradley Powell had already died. I stayed with his parents Sheppard and Dedra, offering compassionate presence, reflective listening, grief support and prayer.   Later, brother Lupita arrived. Family supporting each other well. Sister Joya will be arriving soon as well. Funeral home list and patient placement card provided, along with grief resource.   Chaplains remain available for further support as needed.

## 2024-08-14 NOTE — ED Triage Notes (Signed)
 Pt bib EMS from home after being called out for passing out. EMS reports patient minimally responsive to pain and not breathing on his own. In/out of v-fib en route. EMS bagging patient on arrival. Pt unresponsive, diaphoretic, with cold skin. Pulses weak and thready on arrival to ER.

## 2024-08-14 NOTE — ED Notes (Signed)
 Patient watch and wallet left by family. Attempted to call family but no answer. Belonging sent to morgue with patient.

## 2024-08-14 NOTE — Code Documentation (Signed)
 Patient intubated by Lorette, MD

## 2024-08-14 NOTE — Code Documentation (Signed)
 Mesner visualized heart on US  at bedside. No movement visualized.

## 2024-08-14 DEATH — deceased

## 2024-08-23 NOTE — Progress Notes (Signed)
 Remote ICD Transmission

## 2024-09-27 ENCOUNTER — Encounter: Admitting: Cardiology

## 2024-11-05 ENCOUNTER — Encounter

## 2024-11-29 ENCOUNTER — Other Ambulatory Visit: Payer: Self-pay

## 2025-02-04 ENCOUNTER — Encounter

## 2025-05-06 ENCOUNTER — Encounter

## 2025-08-05 ENCOUNTER — Encounter
